# Patient Record
Sex: Female | Born: 1940 | Race: White | Hispanic: No | Smoking: Former smoker
Health system: Southern US, Community
[De-identification: ages and names within clinical notes are randomized; demographics above are authoritative.]

## PROBLEM LIST (undated history)

## (undated) DIAGNOSIS — I82409 Acute embolism and thrombosis of unspecified deep veins of unspecified lower extremity: Secondary | ICD-10-CM

## (undated) DIAGNOSIS — I10 Essential (primary) hypertension: Secondary | ICD-10-CM

## (undated) DIAGNOSIS — IMO0001 Reserved for inherently not codable concepts without codable children: Secondary | ICD-10-CM

## (undated) DIAGNOSIS — J449 Chronic obstructive pulmonary disease, unspecified: Secondary | ICD-10-CM

## (undated) DIAGNOSIS — D649 Anemia, unspecified: Secondary | ICD-10-CM

## (undated) DIAGNOSIS — K219 Gastro-esophageal reflux disease without esophagitis: Secondary | ICD-10-CM

## (undated) DIAGNOSIS — I2699 Other pulmonary embolism without acute cor pulmonale: Secondary | ICD-10-CM

## (undated) DIAGNOSIS — M1990 Unspecified osteoarthritis, unspecified site: Secondary | ICD-10-CM

## (undated) DIAGNOSIS — A419 Sepsis, unspecified organism: Secondary | ICD-10-CM

## (undated) DIAGNOSIS — F3130 Bipolar disorder, current episode depressed, mild or moderate severity, unspecified: Secondary | ICD-10-CM

## (undated) DIAGNOSIS — F419 Anxiety disorder, unspecified: Secondary | ICD-10-CM

## (undated) DIAGNOSIS — J40 Bronchitis, not specified as acute or chronic: Secondary | ICD-10-CM

## (undated) DIAGNOSIS — E785 Hyperlipidemia, unspecified: Secondary | ICD-10-CM

## (undated) DIAGNOSIS — Z22322 Carrier or suspected carrier of Methicillin resistant Staphylococcus aureus: Secondary | ICD-10-CM

## (undated) DIAGNOSIS — J189 Pneumonia, unspecified organism: Secondary | ICD-10-CM

## (undated) DIAGNOSIS — C801 Malignant (primary) neoplasm, unspecified: Secondary | ICD-10-CM

## (undated) DIAGNOSIS — Z5189 Encounter for other specified aftercare: Secondary | ICD-10-CM

## (undated) DIAGNOSIS — R6521 Severe sepsis with septic shock: Secondary | ICD-10-CM

## (undated) HISTORY — PX: CATARACT EXTRACTION, BILATERAL: SHX1313

## (undated) HISTORY — PX: CHOLECYSTECTOMY: SHX55

## (undated) HISTORY — PX: EYE SURGERY: SHX253

## (undated) HISTORY — DX: Acute embolism and thrombosis of unspecified deep veins of unspecified lower extremity: I82.409

## (undated) HISTORY — PX: OTHER SURGICAL HISTORY: SHX169

## (undated) HISTORY — DX: Essential (primary) hypertension: I10

## (undated) HISTORY — DX: Chronic obstructive pulmonary disease, unspecified: J44.9

## (undated) HISTORY — DX: Gastro-esophageal reflux disease without esophagitis: K21.9

## (undated) HISTORY — DX: Bipolar disorder, current episode depressed, mild or moderate severity, unspecified: F31.30

## (undated) HISTORY — DX: Hyperlipidemia, unspecified: E78.5

## (undated) HISTORY — DX: Unspecified osteoarthritis, unspecified site: M19.90

## (undated) HISTORY — DX: Anxiety disorder, unspecified: F41.9

## (undated) HISTORY — PX: TONSILLECTOMY: SUR1361

## (undated) HISTORY — DX: Other pulmonary embolism without acute cor pulmonale: I26.99

## (undated) HISTORY — PX: VENA CAVA FILTER PLACEMENT: SUR1032

---

## 1967-12-21 DIAGNOSIS — C801 Malignant (primary) neoplasm, unspecified: Secondary | ICD-10-CM

## 1967-12-21 HISTORY — PX: ABDOMINAL HYSTERECTOMY: SHX81

## 1967-12-21 HISTORY — DX: Malignant (primary) neoplasm, unspecified: C80.1

## 1998-05-23 ENCOUNTER — Encounter: Admission: RE | Admit: 1998-05-23 | Discharge: 1998-08-21 | Payer: Self-pay | Admitting: Anesthesiology

## 1998-06-01 ENCOUNTER — Emergency Department (HOSPITAL_COMMUNITY): Admission: EM | Admit: 1998-06-01 | Discharge: 1998-06-01 | Payer: Self-pay | Admitting: Emergency Medicine

## 1998-06-04 ENCOUNTER — Ambulatory Visit (HOSPITAL_COMMUNITY): Admission: RE | Admit: 1998-06-04 | Discharge: 1998-06-04 | Payer: Self-pay

## 1998-06-09 ENCOUNTER — Emergency Department (HOSPITAL_COMMUNITY): Admission: EM | Admit: 1998-06-09 | Discharge: 1998-06-09 | Payer: Self-pay | Admitting: Emergency Medicine

## 1998-06-10 ENCOUNTER — Emergency Department (HOSPITAL_COMMUNITY): Admission: EM | Admit: 1998-06-10 | Discharge: 1998-06-10 | Payer: Self-pay | Admitting: Emergency Medicine

## 1998-07-11 ENCOUNTER — Emergency Department (HOSPITAL_COMMUNITY): Admission: EM | Admit: 1998-07-11 | Discharge: 1998-07-11 | Payer: Self-pay | Admitting: Internal Medicine

## 1998-07-13 ENCOUNTER — Emergency Department (HOSPITAL_COMMUNITY): Admission: EM | Admit: 1998-07-13 | Discharge: 1998-07-13 | Payer: Self-pay | Admitting: Emergency Medicine

## 1998-07-14 ENCOUNTER — Emergency Department (HOSPITAL_COMMUNITY): Admission: EM | Admit: 1998-07-14 | Discharge: 1998-07-14 | Payer: Self-pay | Admitting: Emergency Medicine

## 1998-07-18 ENCOUNTER — Encounter: Admission: RE | Admit: 1998-07-18 | Discharge: 1998-07-18 | Payer: Self-pay | Admitting: Internal Medicine

## 1998-07-28 ENCOUNTER — Encounter: Admission: RE | Admit: 1998-07-28 | Discharge: 1998-07-28 | Payer: Self-pay | Admitting: Internal Medicine

## 1998-08-10 ENCOUNTER — Emergency Department (HOSPITAL_COMMUNITY): Admission: EM | Admit: 1998-08-10 | Discharge: 1998-08-10 | Payer: Self-pay | Admitting: Emergency Medicine

## 1998-08-19 ENCOUNTER — Ambulatory Visit (HOSPITAL_COMMUNITY): Admission: RE | Admit: 1998-08-19 | Discharge: 1998-08-19 | Payer: Self-pay | Admitting: Neurosurgery

## 1998-09-17 ENCOUNTER — Inpatient Hospital Stay (HOSPITAL_COMMUNITY): Admission: EM | Admit: 1998-09-17 | Discharge: 1998-09-22 | Payer: Self-pay | Admitting: Internal Medicine

## 1998-10-08 ENCOUNTER — Emergency Department (HOSPITAL_COMMUNITY): Admission: EM | Admit: 1998-10-08 | Discharge: 1998-10-08 | Payer: Self-pay | Admitting: Emergency Medicine

## 1999-06-25 ENCOUNTER — Emergency Department (HOSPITAL_COMMUNITY): Admission: EM | Admit: 1999-06-25 | Discharge: 1999-06-25 | Payer: Self-pay | Admitting: Emergency Medicine

## 1999-07-04 ENCOUNTER — Emergency Department (HOSPITAL_COMMUNITY): Admission: EM | Admit: 1999-07-04 | Discharge: 1999-07-04 | Payer: Self-pay | Admitting: Emergency Medicine

## 1999-08-12 ENCOUNTER — Encounter: Payer: Self-pay | Admitting: Pulmonary Disease

## 1999-08-12 ENCOUNTER — Ambulatory Visit (HOSPITAL_COMMUNITY): Admission: RE | Admit: 1999-08-12 | Discharge: 1999-08-12 | Payer: Self-pay | Admitting: Pulmonary Disease

## 1999-08-26 ENCOUNTER — Other Ambulatory Visit: Admission: RE | Admit: 1999-08-26 | Discharge: 1999-08-26 | Payer: Self-pay | Admitting: Internal Medicine

## 2000-03-04 ENCOUNTER — Emergency Department (HOSPITAL_COMMUNITY): Admission: EM | Admit: 2000-03-04 | Discharge: 2000-03-04 | Payer: Self-pay | Admitting: Emergency Medicine

## 2000-07-18 ENCOUNTER — Ambulatory Visit (HOSPITAL_COMMUNITY): Admission: RE | Admit: 2000-07-18 | Discharge: 2000-07-19 | Payer: Self-pay | Admitting: Neurosurgery

## 2000-08-15 ENCOUNTER — Emergency Department (HOSPITAL_COMMUNITY): Admission: EM | Admit: 2000-08-15 | Discharge: 2000-08-15 | Payer: Self-pay | Admitting: Emergency Medicine

## 2000-08-17 ENCOUNTER — Encounter: Admission: RE | Admit: 2000-08-17 | Discharge: 2000-08-17 | Payer: Self-pay | Admitting: Family Medicine

## 2000-09-13 ENCOUNTER — Encounter: Admission: RE | Admit: 2000-09-13 | Discharge: 2000-09-13 | Payer: Self-pay | Admitting: Family Medicine

## 2000-10-06 ENCOUNTER — Encounter: Payer: Self-pay | Admitting: Emergency Medicine

## 2000-10-06 ENCOUNTER — Emergency Department (HOSPITAL_COMMUNITY): Admission: EM | Admit: 2000-10-06 | Discharge: 2000-10-06 | Payer: Self-pay | Admitting: Emergency Medicine

## 2000-10-12 ENCOUNTER — Emergency Department (HOSPITAL_COMMUNITY): Admission: EM | Admit: 2000-10-12 | Discharge: 2000-10-12 | Payer: Self-pay | Admitting: Emergency Medicine

## 2000-11-05 ENCOUNTER — Inpatient Hospital Stay (HOSPITAL_COMMUNITY): Admission: EM | Admit: 2000-11-05 | Discharge: 2000-11-07 | Payer: Self-pay | Admitting: Sports Medicine

## 2000-11-05 ENCOUNTER — Encounter: Payer: Self-pay | Admitting: Emergency Medicine

## 2000-11-30 ENCOUNTER — Emergency Department (HOSPITAL_COMMUNITY): Admission: EM | Admit: 2000-11-30 | Discharge: 2000-11-30 | Payer: Self-pay | Admitting: Emergency Medicine

## 2000-12-18 ENCOUNTER — Inpatient Hospital Stay (HOSPITAL_COMMUNITY): Admission: EM | Admit: 2000-12-18 | Discharge: 2000-12-27 | Payer: Self-pay | Admitting: Psychiatry

## 2001-01-17 ENCOUNTER — Emergency Department (HOSPITAL_COMMUNITY): Admission: EM | Admit: 2001-01-17 | Discharge: 2001-01-17 | Payer: Self-pay | Admitting: Emergency Medicine

## 2001-01-29 ENCOUNTER — Emergency Department (HOSPITAL_COMMUNITY): Admission: EM | Admit: 2001-01-29 | Discharge: 2001-01-29 | Payer: Self-pay | Admitting: *Deleted

## 2001-01-31 ENCOUNTER — Emergency Department (HOSPITAL_COMMUNITY): Admission: EM | Admit: 2001-01-31 | Discharge: 2001-02-01 | Payer: Self-pay | Admitting: Emergency Medicine

## 2001-02-05 ENCOUNTER — Encounter: Payer: Self-pay | Admitting: Emergency Medicine

## 2001-02-05 ENCOUNTER — Emergency Department (HOSPITAL_COMMUNITY): Admission: EM | Admit: 2001-02-05 | Discharge: 2001-02-05 | Payer: Self-pay | Admitting: Emergency Medicine

## 2001-02-09 ENCOUNTER — Encounter: Admission: RE | Admit: 2001-02-09 | Discharge: 2001-02-09 | Payer: Self-pay | Admitting: Family Medicine

## 2001-02-16 ENCOUNTER — Encounter: Admission: RE | Admit: 2001-02-16 | Discharge: 2001-02-16 | Payer: Self-pay | Admitting: Family Medicine

## 2001-03-14 ENCOUNTER — Emergency Department (HOSPITAL_COMMUNITY): Admission: EM | Admit: 2001-03-14 | Discharge: 2001-03-14 | Payer: Self-pay | Admitting: *Deleted

## 2001-03-14 ENCOUNTER — Encounter: Payer: Self-pay | Admitting: Emergency Medicine

## 2001-03-30 ENCOUNTER — Emergency Department (HOSPITAL_COMMUNITY): Admission: EM | Admit: 2001-03-30 | Discharge: 2001-03-30 | Payer: Self-pay | Admitting: Emergency Medicine

## 2001-03-30 ENCOUNTER — Emergency Department (HOSPITAL_COMMUNITY): Admission: EM | Admit: 2001-03-30 | Discharge: 2001-03-31 | Payer: Self-pay | Admitting: Emergency Medicine

## 2001-04-01 ENCOUNTER — Emergency Department (HOSPITAL_COMMUNITY): Admission: EM | Admit: 2001-04-01 | Discharge: 2001-04-01 | Payer: Self-pay | Admitting: Emergency Medicine

## 2001-07-10 ENCOUNTER — Emergency Department (HOSPITAL_COMMUNITY): Admission: EM | Admit: 2001-07-10 | Discharge: 2001-07-10 | Payer: Self-pay | Admitting: Emergency Medicine

## 2001-07-10 ENCOUNTER — Encounter: Payer: Self-pay | Admitting: Emergency Medicine

## 2001-07-13 ENCOUNTER — Emergency Department (HOSPITAL_COMMUNITY): Admission: EM | Admit: 2001-07-13 | Discharge: 2001-07-13 | Payer: Self-pay | Admitting: Emergency Medicine

## 2001-07-14 ENCOUNTER — Encounter: Admission: RE | Admit: 2001-07-14 | Discharge: 2001-07-14 | Payer: Self-pay | Admitting: Family Medicine

## 2001-07-14 ENCOUNTER — Ambulatory Visit (HOSPITAL_COMMUNITY): Admission: RE | Admit: 2001-07-14 | Discharge: 2001-07-14 | Payer: Self-pay | Admitting: Family Medicine

## 2001-09-05 ENCOUNTER — Encounter: Admission: RE | Admit: 2001-09-05 | Discharge: 2001-09-05 | Payer: Self-pay | Admitting: Family Medicine

## 2001-09-17 ENCOUNTER — Encounter: Payer: Self-pay | Admitting: Emergency Medicine

## 2001-09-17 ENCOUNTER — Emergency Department (HOSPITAL_COMMUNITY): Admission: EM | Admit: 2001-09-17 | Discharge: 2001-09-17 | Payer: Self-pay | Admitting: Emergency Medicine

## 2001-09-28 ENCOUNTER — Emergency Department (HOSPITAL_COMMUNITY): Admission: EM | Admit: 2001-09-28 | Discharge: 2001-09-28 | Payer: Self-pay

## 2001-10-02 ENCOUNTER — Encounter: Admission: RE | Admit: 2001-10-02 | Discharge: 2001-10-02 | Payer: Self-pay | Admitting: Family Medicine

## 2001-10-06 ENCOUNTER — Emergency Department (HOSPITAL_COMMUNITY): Admission: EM | Admit: 2001-10-06 | Discharge: 2001-10-06 | Payer: Self-pay | Admitting: Emergency Medicine

## 2001-10-23 ENCOUNTER — Encounter: Admission: RE | Admit: 2001-10-23 | Discharge: 2001-10-23 | Payer: Self-pay | Admitting: Sports Medicine

## 2001-10-25 ENCOUNTER — Ambulatory Visit (HOSPITAL_COMMUNITY): Admission: RE | Admit: 2001-10-25 | Discharge: 2001-10-25 | Payer: Self-pay | Admitting: Family Medicine

## 2001-10-25 ENCOUNTER — Encounter: Admission: RE | Admit: 2001-10-25 | Discharge: 2001-10-25 | Payer: Self-pay | Admitting: Family Medicine

## 2001-10-26 ENCOUNTER — Ambulatory Visit: Admission: RE | Admit: 2001-10-26 | Discharge: 2001-10-26 | Payer: Self-pay | Admitting: Anesthesiology

## 2001-10-31 ENCOUNTER — Encounter: Admission: RE | Admit: 2001-10-31 | Discharge: 2001-10-31 | Payer: Self-pay | Admitting: Family Medicine

## 2001-10-31 ENCOUNTER — Emergency Department (HOSPITAL_COMMUNITY): Admission: EM | Admit: 2001-10-31 | Discharge: 2001-10-31 | Payer: Self-pay | Admitting: Emergency Medicine

## 2001-11-06 ENCOUNTER — Emergency Department (HOSPITAL_COMMUNITY): Admission: EM | Admit: 2001-11-06 | Discharge: 2001-11-06 | Payer: Self-pay | Admitting: Emergency Medicine

## 2001-11-17 ENCOUNTER — Emergency Department (HOSPITAL_COMMUNITY): Admission: EM | Admit: 2001-11-17 | Discharge: 2001-11-17 | Payer: Self-pay | Admitting: Emergency Medicine

## 2002-01-07 ENCOUNTER — Encounter: Payer: Self-pay | Admitting: Emergency Medicine

## 2002-01-07 ENCOUNTER — Inpatient Hospital Stay (HOSPITAL_COMMUNITY): Admission: EM | Admit: 2002-01-07 | Discharge: 2002-01-10 | Payer: Self-pay | Admitting: Emergency Medicine

## 2002-01-19 ENCOUNTER — Encounter: Admission: RE | Admit: 2002-01-19 | Discharge: 2002-01-19 | Payer: Self-pay | Admitting: Family Medicine

## 2002-01-25 ENCOUNTER — Encounter: Admission: RE | Admit: 2002-01-25 | Discharge: 2002-01-25 | Payer: Self-pay | Admitting: Family Medicine

## 2002-02-08 ENCOUNTER — Encounter: Admission: RE | Admit: 2002-02-08 | Discharge: 2002-02-08 | Payer: Self-pay | Admitting: Family Medicine

## 2002-04-19 ENCOUNTER — Emergency Department (HOSPITAL_COMMUNITY): Admission: EM | Admit: 2002-04-19 | Discharge: 2002-04-19 | Payer: Self-pay | Admitting: Emergency Medicine

## 2002-04-22 ENCOUNTER — Emergency Department (HOSPITAL_COMMUNITY): Admission: EM | Admit: 2002-04-22 | Discharge: 2002-04-22 | Payer: Self-pay | Admitting: Emergency Medicine

## 2002-04-28 ENCOUNTER — Emergency Department (HOSPITAL_COMMUNITY): Admission: EM | Admit: 2002-04-28 | Discharge: 2002-04-29 | Payer: Self-pay | Admitting: *Deleted

## 2002-05-05 ENCOUNTER — Inpatient Hospital Stay (HOSPITAL_COMMUNITY): Admission: EM | Admit: 2002-05-05 | Discharge: 2002-05-23 | Payer: Self-pay | Admitting: Psychiatry

## 2002-05-24 ENCOUNTER — Other Ambulatory Visit (HOSPITAL_COMMUNITY): Admission: RE | Admit: 2002-05-24 | Discharge: 2002-06-13 | Payer: Self-pay | Admitting: *Deleted

## 2002-06-27 ENCOUNTER — Encounter (INDEPENDENT_AMBULATORY_CARE_PROVIDER_SITE_OTHER): Payer: Self-pay | Admitting: *Deleted

## 2002-06-27 ENCOUNTER — Encounter: Admission: RE | Admit: 2002-06-27 | Discharge: 2002-06-27 | Payer: Self-pay | Admitting: Sports Medicine

## 2002-06-29 ENCOUNTER — Encounter: Payer: Self-pay | Admitting: Emergency Medicine

## 2002-06-29 ENCOUNTER — Inpatient Hospital Stay (HOSPITAL_COMMUNITY): Admission: EM | Admit: 2002-06-29 | Discharge: 2002-07-12 | Payer: Self-pay | Admitting: Psychiatry

## 2002-07-29 ENCOUNTER — Emergency Department (HOSPITAL_COMMUNITY): Admission: EM | Admit: 2002-07-29 | Discharge: 2002-07-29 | Payer: Self-pay | Admitting: Emergency Medicine

## 2002-07-29 ENCOUNTER — Encounter: Payer: Self-pay | Admitting: Emergency Medicine

## 2002-09-13 ENCOUNTER — Encounter: Admission: RE | Admit: 2002-09-13 | Discharge: 2002-09-13 | Payer: Self-pay | Admitting: Family Medicine

## 2002-09-14 ENCOUNTER — Encounter: Payer: Self-pay | Admitting: Emergency Medicine

## 2002-09-14 ENCOUNTER — Emergency Department (HOSPITAL_COMMUNITY): Admission: EM | Admit: 2002-09-14 | Discharge: 2002-09-14 | Payer: Self-pay | Admitting: Emergency Medicine

## 2002-09-21 ENCOUNTER — Ambulatory Visit (HOSPITAL_COMMUNITY): Admission: RE | Admit: 2002-09-21 | Discharge: 2002-09-21 | Payer: Self-pay | Admitting: *Deleted

## 2002-09-21 ENCOUNTER — Encounter: Admission: RE | Admit: 2002-09-21 | Discharge: 2002-09-21 | Payer: Self-pay | Admitting: Family Medicine

## 2002-09-23 ENCOUNTER — Encounter: Payer: Self-pay | Admitting: Emergency Medicine

## 2002-09-23 ENCOUNTER — Inpatient Hospital Stay (HOSPITAL_COMMUNITY): Admission: EM | Admit: 2002-09-23 | Discharge: 2002-09-24 | Payer: Self-pay | Admitting: Emergency Medicine

## 2002-09-28 ENCOUNTER — Encounter: Admission: RE | Admit: 2002-09-28 | Discharge: 2002-09-28 | Payer: Self-pay | Admitting: Family Medicine

## 2002-10-02 ENCOUNTER — Encounter: Admission: RE | Admit: 2002-10-02 | Discharge: 2002-10-02 | Payer: Self-pay | Admitting: Family Medicine

## 2002-10-03 ENCOUNTER — Encounter: Admission: RE | Admit: 2002-10-03 | Discharge: 2002-10-03 | Payer: Self-pay | Admitting: Family Medicine

## 2002-10-06 ENCOUNTER — Emergency Department (HOSPITAL_COMMUNITY): Admission: EM | Admit: 2002-10-06 | Discharge: 2002-10-06 | Payer: Self-pay | Admitting: *Deleted

## 2002-10-20 ENCOUNTER — Emergency Department (HOSPITAL_COMMUNITY): Admission: EM | Admit: 2002-10-20 | Discharge: 2002-10-20 | Payer: Self-pay | Admitting: *Deleted

## 2002-10-24 ENCOUNTER — Encounter: Admission: RE | Admit: 2002-10-24 | Discharge: 2002-10-24 | Payer: Self-pay | Admitting: Family Medicine

## 2002-11-19 ENCOUNTER — Encounter: Payer: Self-pay | Admitting: *Deleted

## 2002-11-19 ENCOUNTER — Inpatient Hospital Stay (HOSPITAL_COMMUNITY): Admission: EM | Admit: 2002-11-19 | Discharge: 2002-11-21 | Payer: Self-pay | Admitting: Family Medicine

## 2002-11-19 ENCOUNTER — Emergency Department (HOSPITAL_COMMUNITY): Admission: EM | Admit: 2002-11-19 | Discharge: 2002-11-19 | Payer: Self-pay | Admitting: Emergency Medicine

## 2002-11-23 ENCOUNTER — Encounter: Admission: RE | Admit: 2002-11-23 | Discharge: 2002-11-23 | Payer: Self-pay | Admitting: Family Medicine

## 2002-12-04 ENCOUNTER — Encounter: Admission: RE | Admit: 2002-12-04 | Discharge: 2002-12-04 | Payer: Self-pay | Admitting: Family Medicine

## 2002-12-12 ENCOUNTER — Emergency Department (HOSPITAL_COMMUNITY): Admission: EM | Admit: 2002-12-12 | Discharge: 2002-12-12 | Payer: Self-pay | Admitting: Emergency Medicine

## 2002-12-14 ENCOUNTER — Encounter: Admission: RE | Admit: 2002-12-14 | Discharge: 2002-12-14 | Payer: Self-pay | Admitting: Family Medicine

## 2002-12-16 ENCOUNTER — Encounter: Payer: Self-pay | Admitting: Emergency Medicine

## 2002-12-16 ENCOUNTER — Emergency Department (HOSPITAL_COMMUNITY): Admission: EM | Admit: 2002-12-16 | Discharge: 2002-12-16 | Payer: Self-pay | Admitting: Emergency Medicine

## 2002-12-17 ENCOUNTER — Encounter: Admission: RE | Admit: 2002-12-17 | Discharge: 2002-12-17 | Payer: Self-pay | Admitting: Sports Medicine

## 2002-12-26 ENCOUNTER — Encounter: Admission: RE | Admit: 2002-12-26 | Discharge: 2002-12-26 | Payer: Self-pay | Admitting: Family Medicine

## 2002-12-28 ENCOUNTER — Encounter: Payer: Self-pay | Admitting: Emergency Medicine

## 2002-12-28 ENCOUNTER — Emergency Department (HOSPITAL_COMMUNITY): Admission: EM | Admit: 2002-12-28 | Discharge: 2002-12-28 | Payer: Self-pay | Admitting: Emergency Medicine

## 2002-12-31 ENCOUNTER — Emergency Department (HOSPITAL_COMMUNITY): Admission: EM | Admit: 2002-12-31 | Discharge: 2002-12-31 | Payer: Self-pay | Admitting: Emergency Medicine

## 2002-12-31 ENCOUNTER — Encounter: Payer: Self-pay | Admitting: Emergency Medicine

## 2003-01-02 ENCOUNTER — Encounter: Admission: RE | Admit: 2003-01-02 | Discharge: 2003-01-02 | Payer: Self-pay | Admitting: Family Medicine

## 2003-01-09 ENCOUNTER — Encounter: Admission: RE | Admit: 2003-01-09 | Discharge: 2003-01-09 | Payer: Self-pay | Admitting: Family Medicine

## 2003-01-11 ENCOUNTER — Encounter: Payer: Self-pay | Admitting: Emergency Medicine

## 2003-01-11 ENCOUNTER — Emergency Department (HOSPITAL_COMMUNITY): Admission: EM | Admit: 2003-01-11 | Discharge: 2003-01-11 | Payer: Self-pay | Admitting: Emergency Medicine

## 2003-01-15 ENCOUNTER — Emergency Department (HOSPITAL_COMMUNITY): Admission: EM | Admit: 2003-01-15 | Discharge: 2003-01-15 | Payer: Self-pay | Admitting: Emergency Medicine

## 2003-01-15 ENCOUNTER — Encounter: Payer: Self-pay | Admitting: Emergency Medicine

## 2003-01-21 ENCOUNTER — Encounter: Payer: Self-pay | Admitting: Emergency Medicine

## 2003-01-21 ENCOUNTER — Emergency Department (HOSPITAL_COMMUNITY): Admission: EM | Admit: 2003-01-21 | Discharge: 2003-01-21 | Payer: Self-pay | Admitting: Emergency Medicine

## 2003-01-28 ENCOUNTER — Encounter: Payer: Self-pay | Admitting: Emergency Medicine

## 2003-01-28 ENCOUNTER — Emergency Department (HOSPITAL_COMMUNITY): Admission: EM | Admit: 2003-01-28 | Discharge: 2003-01-28 | Payer: Self-pay | Admitting: Emergency Medicine

## 2003-01-30 ENCOUNTER — Emergency Department (HOSPITAL_COMMUNITY): Admission: EM | Admit: 2003-01-30 | Discharge: 2003-01-30 | Payer: Self-pay | Admitting: Emergency Medicine

## 2003-02-07 ENCOUNTER — Encounter: Payer: Self-pay | Admitting: Emergency Medicine

## 2003-02-07 ENCOUNTER — Emergency Department (HOSPITAL_COMMUNITY): Admission: EM | Admit: 2003-02-07 | Discharge: 2003-02-07 | Payer: Self-pay | Admitting: Emergency Medicine

## 2003-02-09 ENCOUNTER — Encounter: Payer: Self-pay | Admitting: Emergency Medicine

## 2003-02-09 ENCOUNTER — Emergency Department (HOSPITAL_COMMUNITY): Admission: EM | Admit: 2003-02-09 | Discharge: 2003-02-09 | Payer: Self-pay | Admitting: Emergency Medicine

## 2003-02-15 ENCOUNTER — Emergency Department (HOSPITAL_COMMUNITY): Admission: EM | Admit: 2003-02-15 | Discharge: 2003-02-15 | Payer: Self-pay | Admitting: Emergency Medicine

## 2003-02-21 ENCOUNTER — Encounter: Payer: Self-pay | Admitting: Family Medicine

## 2003-02-21 ENCOUNTER — Encounter: Admission: RE | Admit: 2003-02-21 | Discharge: 2003-02-21 | Payer: Self-pay | Admitting: Family Medicine

## 2003-02-22 ENCOUNTER — Emergency Department (HOSPITAL_COMMUNITY): Admission: EM | Admit: 2003-02-22 | Discharge: 2003-02-22 | Payer: Self-pay | Admitting: Emergency Medicine

## 2003-02-23 IMAGING — CT CT HEAD W/O CM
1 of 2 series · 13 of 30 positions shown, 17 images · non-contrast
Comparison: 06/29/02.

FINDINGS
CLINICAL DATA: 61 YEAR OLD WHO FELL.  PATIENT COMPLAINS OF DIZZINESS.
CT HEAD WITHOUT CONTRAST
AXIAL IMAGES ARE ACQUIRED THROUGH THE BRAIN AT 5 MM INCREMENTS WITHOUT CONTRAST.

[Series 2: — · axial · 0.43mm/px · z∈[+1246,+1371]mm · 13 of 32 slices shown, 17 images]
[im 3/32  brain]
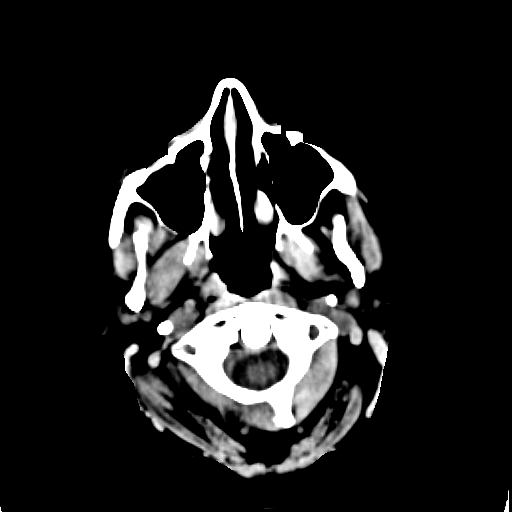
[im 3/32  bone]
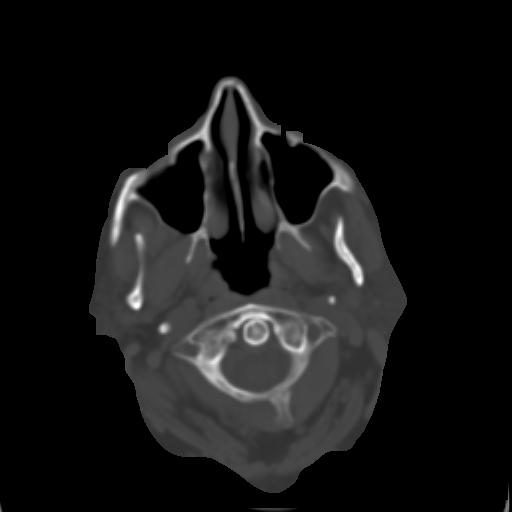
[im 5/32  brain]
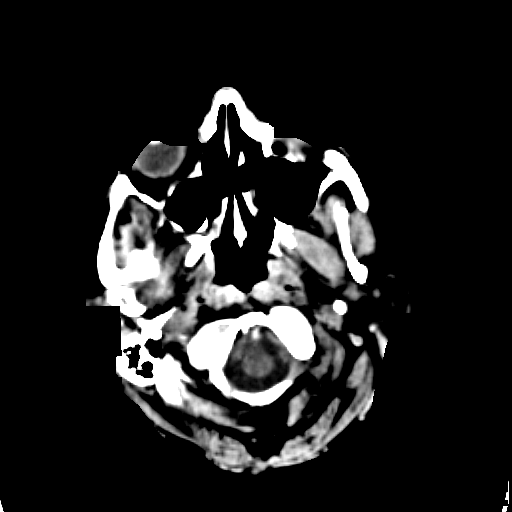
[im 7/32  brain]
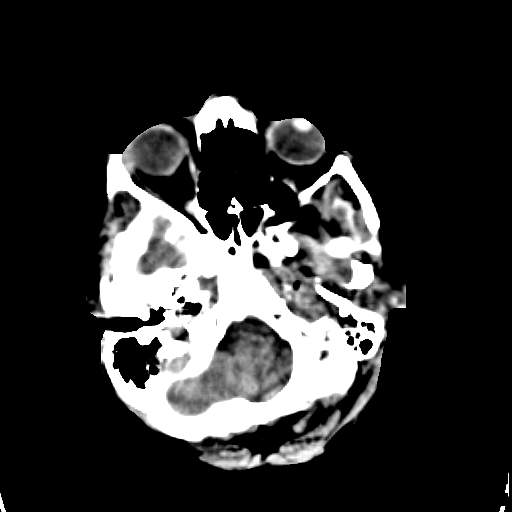
[im 9/32  brain]
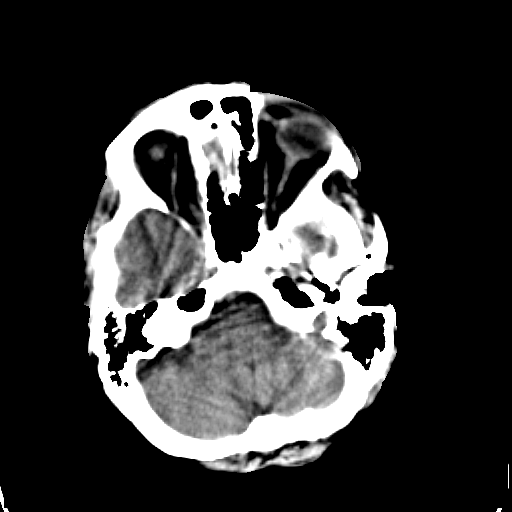
[im 12/32  brain]
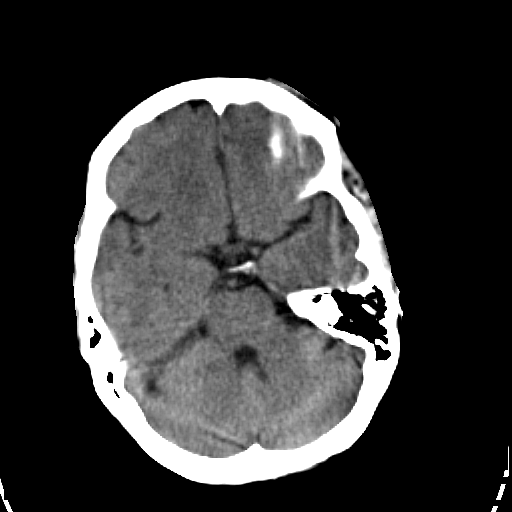
[im 12/32  bone]
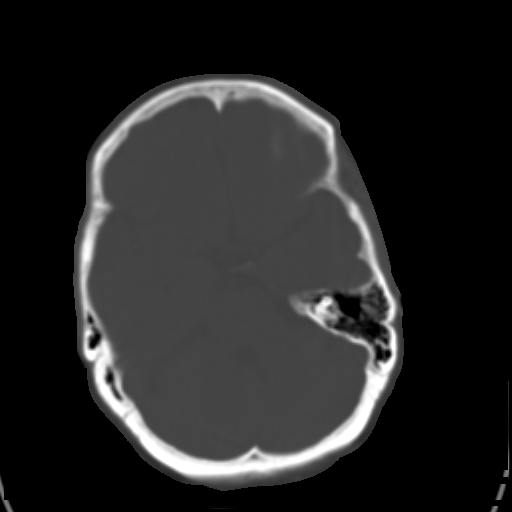
[im 14/32  brain]
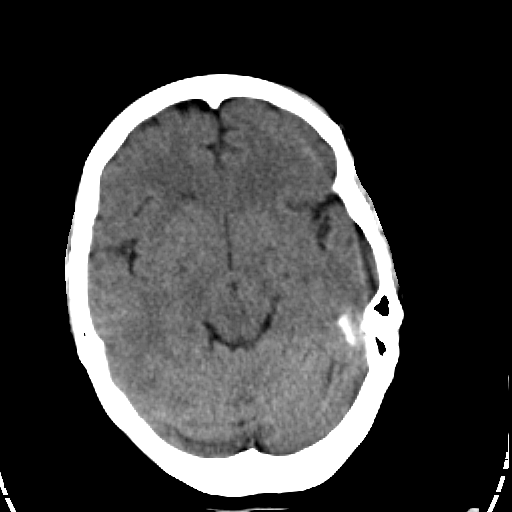
[im 16/32  brain]
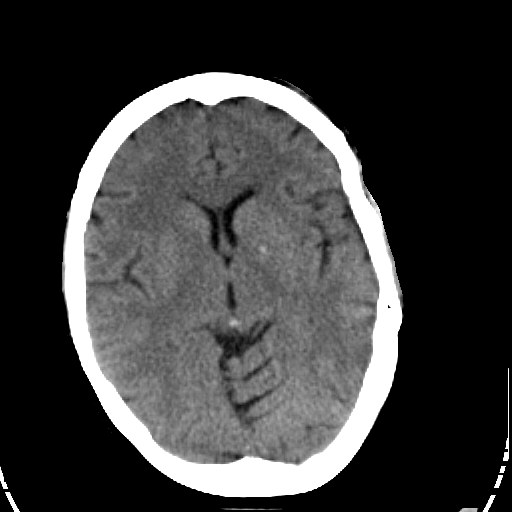
[im 18/32  brain]
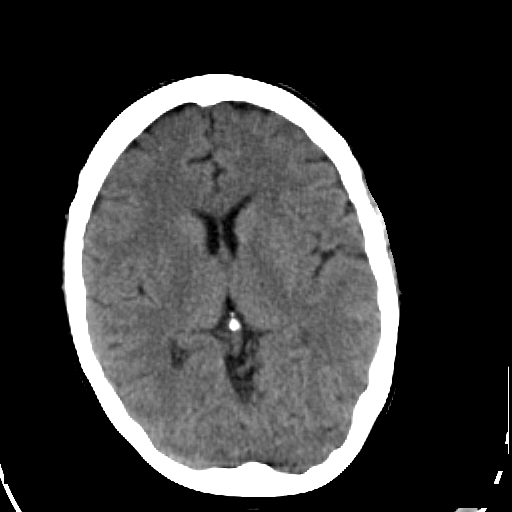
[im 20/32  brain]
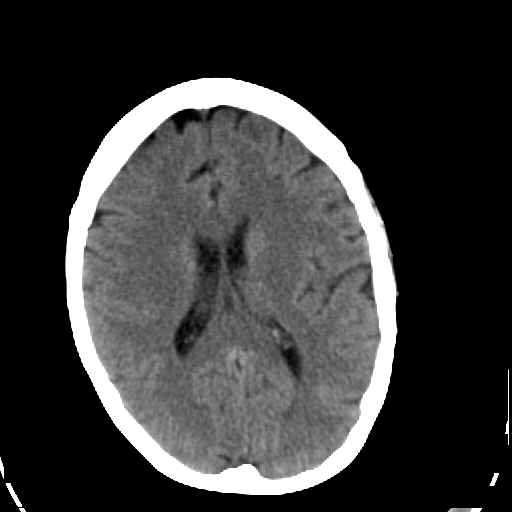
[im 20/32  bone]
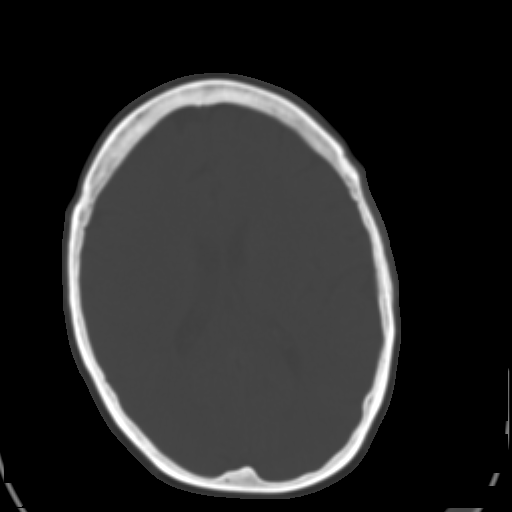
[im 23/32  brain]
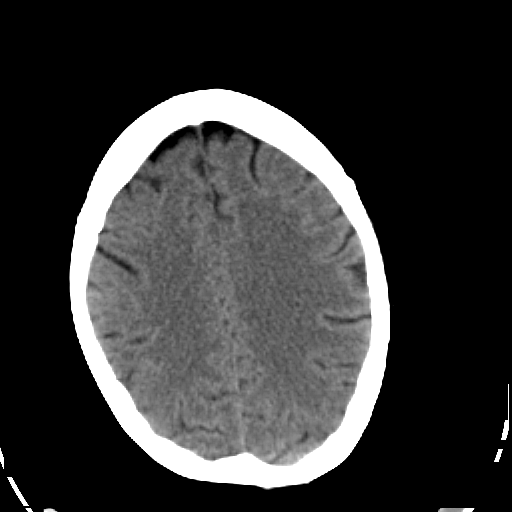
[im 25/32  brain]
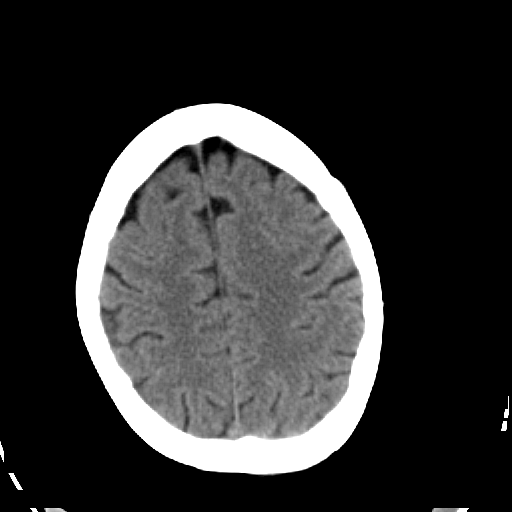
[im 27/32  brain]
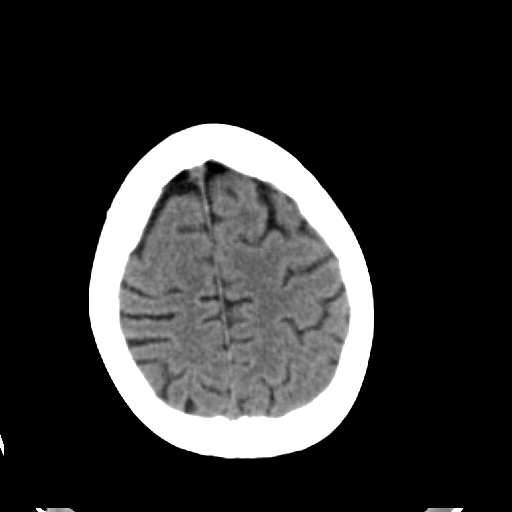
[im 29/32  brain]
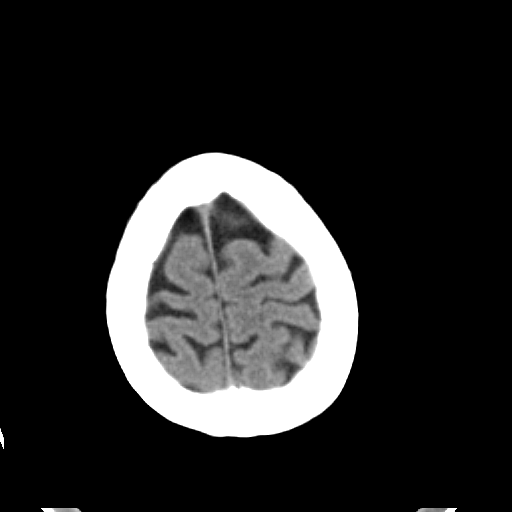
[im 29/32  bone]
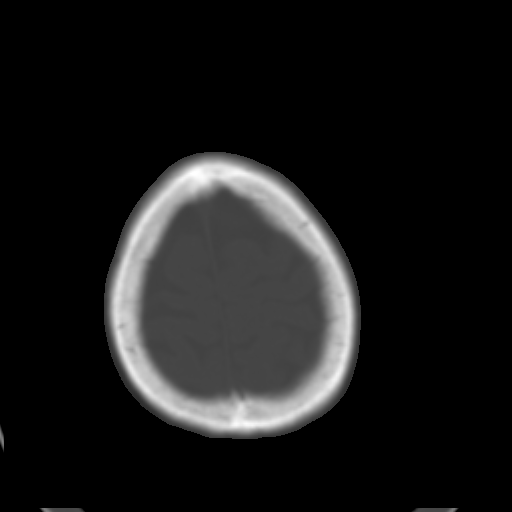

[13 of 30 positions shown; findings below may reference images not displayed]

THERE IS NO INTRA- OR EXTRA-AXIAL FLUID COLLECTION OR MASS.  THE BASILAR CISTERNS AND VENTRICLES
HAVE A NORMAL APPEARANCE.  THERE IS SOFT TISSUE EDEMA OVERLYING THE LEFT ORBIT AND LEFT FRONTAL
SCALP.  THERE IS NO EVIDENCE FOR CALVARIAL FRACTURE HOWEVER.
IMPRESSION
1.   NO EVIDENCE FOR ACUTE INTRACRANIAL ABNORMALITY.
2.   LEFT ORBITAL AND LEFT FRONTAL SOFT TISSUE EDEMA WITHOUT EVIDENCE FOR UNDERLYING CALVARIAL
INJURY.

## 2003-03-03 ENCOUNTER — Emergency Department (HOSPITAL_COMMUNITY): Admission: EM | Admit: 2003-03-03 | Discharge: 2003-03-04 | Payer: Self-pay

## 2003-03-19 ENCOUNTER — Encounter: Admission: RE | Admit: 2003-03-19 | Discharge: 2003-03-19 | Payer: Self-pay | Admitting: Family Medicine

## 2003-03-22 ENCOUNTER — Encounter: Admission: RE | Admit: 2003-03-22 | Discharge: 2003-03-22 | Payer: Self-pay | Admitting: Family Medicine

## 2003-03-25 ENCOUNTER — Encounter: Admission: RE | Admit: 2003-03-25 | Discharge: 2003-03-25 | Payer: Self-pay | Admitting: Family Medicine

## 2003-03-28 ENCOUNTER — Encounter: Admission: RE | Admit: 2003-03-28 | Discharge: 2003-03-28 | Payer: Self-pay | Admitting: Family Medicine

## 2003-04-01 ENCOUNTER — Encounter: Admission: RE | Admit: 2003-04-01 | Discharge: 2003-04-01 | Payer: Self-pay | Admitting: Family Medicine

## 2003-04-04 ENCOUNTER — Encounter: Admission: RE | Admit: 2003-04-04 | Discharge: 2003-04-04 | Payer: Self-pay | Admitting: Family Medicine

## 2003-04-08 ENCOUNTER — Encounter: Admission: RE | Admit: 2003-04-08 | Discharge: 2003-04-08 | Payer: Self-pay | Admitting: Family Medicine

## 2003-04-09 ENCOUNTER — Encounter: Admission: RE | Admit: 2003-04-09 | Discharge: 2003-04-09 | Payer: Self-pay | Admitting: Family Medicine

## 2003-04-11 ENCOUNTER — Encounter: Admission: RE | Admit: 2003-04-11 | Discharge: 2003-04-11 | Payer: Self-pay | Admitting: Family Medicine

## 2003-04-17 ENCOUNTER — Encounter: Admission: RE | Admit: 2003-04-17 | Discharge: 2003-04-17 | Payer: Self-pay | Admitting: Family Medicine

## 2003-04-18 ENCOUNTER — Encounter: Admission: RE | Admit: 2003-04-18 | Discharge: 2003-04-18 | Payer: Self-pay | Admitting: Family Medicine

## 2003-04-21 ENCOUNTER — Emergency Department (HOSPITAL_COMMUNITY): Admission: EM | Admit: 2003-04-21 | Discharge: 2003-04-21 | Payer: Self-pay | Admitting: *Deleted

## 2003-04-21 ENCOUNTER — Encounter: Payer: Self-pay | Admitting: *Deleted

## 2003-04-23 ENCOUNTER — Encounter: Admission: RE | Admit: 2003-04-23 | Discharge: 2003-04-23 | Payer: Self-pay | Admitting: Family Medicine

## 2003-04-25 ENCOUNTER — Encounter: Admission: RE | Admit: 2003-04-25 | Discharge: 2003-04-25 | Payer: Self-pay | Admitting: Family Medicine

## 2003-05-03 ENCOUNTER — Encounter: Admission: RE | Admit: 2003-05-03 | Discharge: 2003-05-03 | Payer: Self-pay | Admitting: Family Medicine

## 2003-05-07 ENCOUNTER — Emergency Department (HOSPITAL_COMMUNITY): Admission: EM | Admit: 2003-05-07 | Discharge: 2003-05-07 | Payer: Self-pay | Admitting: Emergency Medicine

## 2003-05-09 ENCOUNTER — Encounter: Payer: Self-pay | Admitting: Emergency Medicine

## 2003-05-09 ENCOUNTER — Inpatient Hospital Stay (HOSPITAL_COMMUNITY): Admission: EM | Admit: 2003-05-09 | Discharge: 2003-05-11 | Payer: Self-pay

## 2003-05-14 ENCOUNTER — Encounter: Admission: RE | Admit: 2003-05-14 | Discharge: 2003-05-14 | Payer: Self-pay | Admitting: Family Medicine

## 2003-05-15 ENCOUNTER — Emergency Department (HOSPITAL_COMMUNITY): Admission: EM | Admit: 2003-05-15 | Discharge: 2003-05-15 | Payer: Self-pay | Admitting: Emergency Medicine

## 2003-05-15 ENCOUNTER — Encounter: Admission: RE | Admit: 2003-05-15 | Discharge: 2003-05-15 | Payer: Self-pay | Admitting: Family Medicine

## 2003-05-17 ENCOUNTER — Encounter: Admission: RE | Admit: 2003-05-17 | Discharge: 2003-05-17 | Payer: Self-pay | Admitting: Family Medicine

## 2003-05-22 ENCOUNTER — Emergency Department (HOSPITAL_COMMUNITY): Admission: EM | Admit: 2003-05-22 | Discharge: 2003-05-22 | Payer: Self-pay | Admitting: Emergency Medicine

## 2003-05-22 ENCOUNTER — Encounter: Payer: Self-pay | Admitting: Emergency Medicine

## 2003-05-29 ENCOUNTER — Encounter: Admission: RE | Admit: 2003-05-29 | Discharge: 2003-05-29 | Payer: Self-pay | Admitting: Family Medicine

## 2003-06-13 ENCOUNTER — Encounter: Admission: RE | Admit: 2003-06-13 | Discharge: 2003-06-13 | Payer: Self-pay | Admitting: Family Medicine

## 2003-06-20 ENCOUNTER — Encounter: Admission: RE | Admit: 2003-06-20 | Discharge: 2003-06-20 | Payer: Self-pay | Admitting: Sports Medicine

## 2003-06-21 ENCOUNTER — Encounter: Admission: RE | Admit: 2003-06-21 | Discharge: 2003-06-21 | Payer: Self-pay | Admitting: Family Medicine

## 2003-06-28 ENCOUNTER — Encounter: Admission: RE | Admit: 2003-06-28 | Discharge: 2003-06-28 | Payer: Self-pay

## 2003-07-05 ENCOUNTER — Encounter: Admission: RE | Admit: 2003-07-05 | Discharge: 2003-07-05 | Payer: Self-pay | Admitting: Sports Medicine

## 2003-07-12 ENCOUNTER — Encounter: Admission: RE | Admit: 2003-07-12 | Discharge: 2003-07-12 | Payer: Self-pay | Admitting: Family Medicine

## 2003-07-15 ENCOUNTER — Encounter: Admission: RE | Admit: 2003-07-15 | Discharge: 2003-07-15 | Payer: Self-pay | Admitting: Family Medicine

## 2003-07-21 ENCOUNTER — Encounter: Payer: Self-pay | Admitting: Emergency Medicine

## 2003-07-21 ENCOUNTER — Emergency Department (HOSPITAL_COMMUNITY): Admission: EM | Admit: 2003-07-21 | Discharge: 2003-07-21 | Payer: Self-pay | Admitting: Emergency Medicine

## 2003-08-01 ENCOUNTER — Encounter: Admission: RE | Admit: 2003-08-01 | Discharge: 2003-08-01 | Payer: Self-pay | Admitting: Family Medicine

## 2003-08-08 ENCOUNTER — Encounter: Admission: RE | Admit: 2003-08-08 | Discharge: 2003-08-08 | Payer: Self-pay | Admitting: Family Medicine

## 2003-08-12 ENCOUNTER — Inpatient Hospital Stay (HOSPITAL_COMMUNITY): Admission: EM | Admit: 2003-08-12 | Discharge: 2003-08-14 | Payer: Self-pay | Admitting: *Deleted

## 2003-08-12 ENCOUNTER — Encounter: Payer: Self-pay | Admitting: *Deleted

## 2003-08-13 ENCOUNTER — Encounter: Payer: Self-pay | Admitting: Family Medicine

## 2003-08-21 ENCOUNTER — Encounter: Admission: RE | Admit: 2003-08-21 | Discharge: 2003-08-21 | Payer: Self-pay | Admitting: Family Medicine

## 2003-08-30 ENCOUNTER — Encounter: Admission: RE | Admit: 2003-08-30 | Discharge: 2003-08-30 | Payer: Self-pay | Admitting: Family Medicine

## 2003-09-01 ENCOUNTER — Emergency Department (HOSPITAL_COMMUNITY): Admission: EM | Admit: 2003-09-01 | Discharge: 2003-09-01 | Payer: Self-pay | Admitting: Emergency Medicine

## 2003-09-01 ENCOUNTER — Encounter: Payer: Self-pay | Admitting: Emergency Medicine

## 2003-09-03 ENCOUNTER — Encounter: Payer: Self-pay | Admitting: Emergency Medicine

## 2003-09-03 ENCOUNTER — Emergency Department (HOSPITAL_COMMUNITY): Admission: EM | Admit: 2003-09-03 | Discharge: 2003-09-03 | Payer: Self-pay

## 2003-09-10 ENCOUNTER — Encounter: Admission: RE | Admit: 2003-09-10 | Discharge: 2003-09-10 | Payer: Self-pay | Admitting: Family Medicine

## 2003-09-10 ENCOUNTER — Encounter: Payer: Self-pay | Admitting: Family Medicine

## 2003-09-16 ENCOUNTER — Inpatient Hospital Stay (HOSPITAL_COMMUNITY): Admission: EM | Admit: 2003-09-16 | Discharge: 2003-09-24 | Payer: Self-pay | Admitting: Psychiatry

## 2003-09-18 ENCOUNTER — Ambulatory Visit (HOSPITAL_COMMUNITY): Admission: RE | Admit: 2003-09-18 | Discharge: 2003-09-18 | Payer: Self-pay | Admitting: Internal Medicine

## 2003-09-30 ENCOUNTER — Encounter: Admission: RE | Admit: 2003-09-30 | Discharge: 2003-09-30 | Payer: Self-pay | Admitting: Family Medicine

## 2003-10-25 ENCOUNTER — Encounter: Admission: RE | Admit: 2003-10-25 | Discharge: 2003-10-25 | Payer: Self-pay | Admitting: Family Medicine

## 2003-10-30 ENCOUNTER — Encounter: Admission: RE | Admit: 2003-10-30 | Discharge: 2003-10-30 | Payer: Self-pay | Admitting: Family Medicine

## 2003-11-21 ENCOUNTER — Encounter: Admission: RE | Admit: 2003-11-21 | Discharge: 2003-11-21 | Payer: Self-pay | Admitting: Family Medicine

## 2003-11-23 ENCOUNTER — Encounter (INDEPENDENT_AMBULATORY_CARE_PROVIDER_SITE_OTHER): Payer: Self-pay | Admitting: *Deleted

## 2003-12-02 ENCOUNTER — Emergency Department (HOSPITAL_COMMUNITY): Admission: EM | Admit: 2003-12-02 | Discharge: 2003-12-02 | Payer: Self-pay | Admitting: Emergency Medicine

## 2003-12-02 ENCOUNTER — Inpatient Hospital Stay (HOSPITAL_COMMUNITY): Admission: AD | Admit: 2003-12-02 | Discharge: 2003-12-03 | Payer: Self-pay | Admitting: Family Medicine

## 2003-12-05 ENCOUNTER — Emergency Department (HOSPITAL_COMMUNITY): Admission: EM | Admit: 2003-12-05 | Discharge: 2003-12-05 | Payer: Self-pay | Admitting: Emergency Medicine

## 2003-12-06 ENCOUNTER — Encounter (INDEPENDENT_AMBULATORY_CARE_PROVIDER_SITE_OTHER): Payer: Self-pay | Admitting: Specialist

## 2003-12-06 ENCOUNTER — Encounter: Admission: RE | Admit: 2003-12-06 | Discharge: 2003-12-06 | Payer: Self-pay | Admitting: Sports Medicine

## 2003-12-06 ENCOUNTER — Other Ambulatory Visit: Admission: RE | Admit: 2003-12-06 | Discharge: 2003-12-06 | Payer: Self-pay | Admitting: Family Medicine

## 2003-12-07 ENCOUNTER — Emergency Department (HOSPITAL_COMMUNITY): Admission: EM | Admit: 2003-12-07 | Discharge: 2003-12-07 | Payer: Self-pay | Admitting: Emergency Medicine

## 2003-12-08 ENCOUNTER — Emergency Department (HOSPITAL_COMMUNITY): Admission: EM | Admit: 2003-12-08 | Discharge: 2003-12-08 | Payer: Self-pay

## 2003-12-08 ENCOUNTER — Emergency Department (HOSPITAL_COMMUNITY): Admission: EM | Admit: 2003-12-08 | Discharge: 2003-12-08 | Payer: Self-pay | Admitting: Emergency Medicine

## 2003-12-14 ENCOUNTER — Emergency Department (HOSPITAL_COMMUNITY): Admission: EM | Admit: 2003-12-14 | Discharge: 2003-12-14 | Payer: Self-pay | Admitting: Emergency Medicine

## 2003-12-17 ENCOUNTER — Emergency Department (HOSPITAL_COMMUNITY): Admission: EM | Admit: 2003-12-17 | Discharge: 2003-12-17 | Payer: Self-pay | Admitting: Emergency Medicine

## 2003-12-17 ENCOUNTER — Encounter: Admission: RE | Admit: 2003-12-17 | Discharge: 2003-12-17 | Payer: Self-pay | Admitting: Family Medicine

## 2003-12-20 ENCOUNTER — Inpatient Hospital Stay (HOSPITAL_COMMUNITY): Admission: EM | Admit: 2003-12-20 | Discharge: 2003-12-27 | Payer: Self-pay | Admitting: Family Medicine

## 2003-12-20 ENCOUNTER — Emergency Department (HOSPITAL_COMMUNITY): Admission: EM | Admit: 2003-12-20 | Discharge: 2003-12-20 | Payer: Self-pay | Admitting: Emergency Medicine

## 2004-01-08 ENCOUNTER — Encounter: Admission: RE | Admit: 2004-01-08 | Discharge: 2004-01-08 | Payer: Self-pay | Admitting: Family Medicine

## 2004-01-10 IMAGING — CT CT CHEST W/ CM
1 of 3 series · 16 of 28 positions shown, 20 images · IV contrast (100 ML OMNI 300)
Comparison: none

CLINICAL DATA: Chest pain.
 CT CHEST WITH CONTRAST
 150 cc Omnipaque 300 IV.  PE protocol.  No comparison. 
 Negative for pulmonary embolism.  There is no infiltrate or effusion.  There is some mild mediastinal adenopathy with a 10 x 20 mm subcarinal node and some subcentimeter right paratracheal nodes.  These are nonspecific.  
 IMPRESSION
 Negative for pulmonary embolism.  The lungs are clear. 
 Nonspecific mediastinal lymph adenopathy is noted. 
 CT OF THE LOWER EXTREMITIES WITH CONTRAST
 The popliteal through the iliac veins were scanned.  Negative for deep venous thrombosis in the lower extremities. 
 Negative for lower extremity DVT.

[Series 2: pe · axial · 0.63mm/px · z∈[-232,-8]mm · 16 of 413 slices shown, 20 images]
[im 20/413  mediastinal]
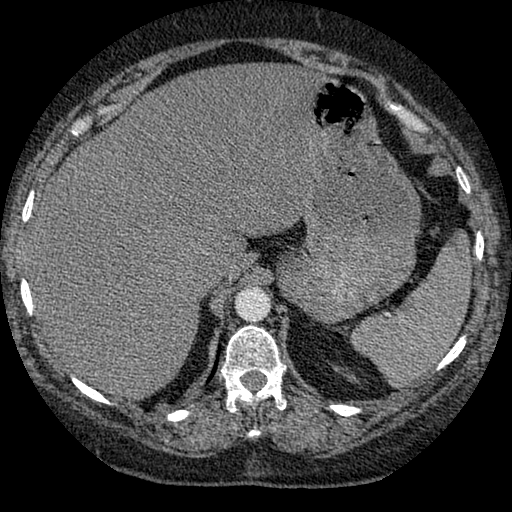
[im 20/413  lung]
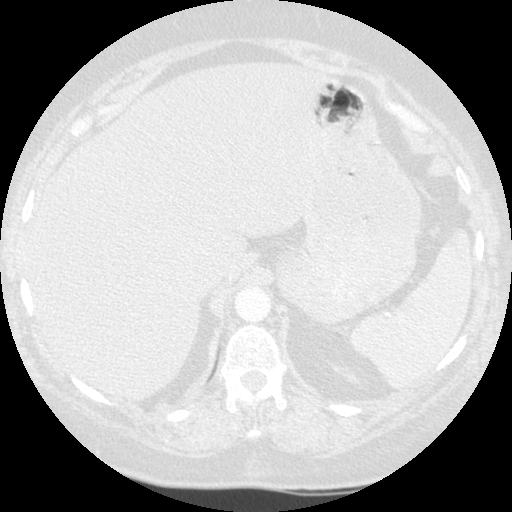
[im 40/413  lung]
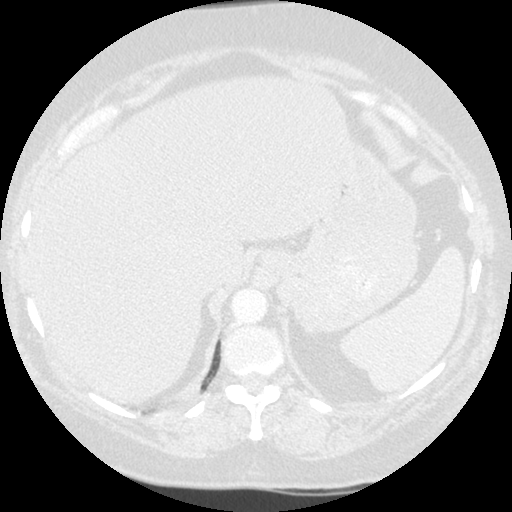
[im 79/413  lung]
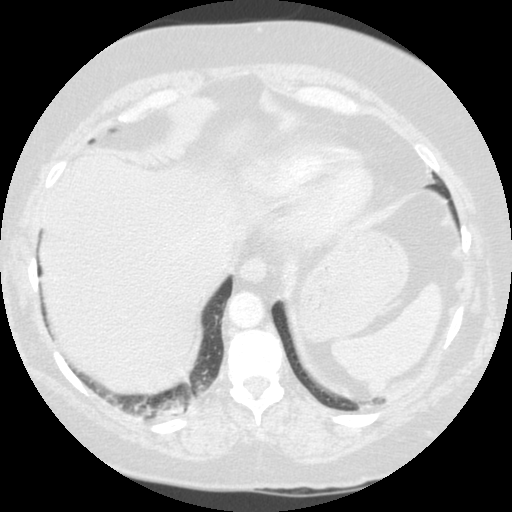
[im 99/413  lung]
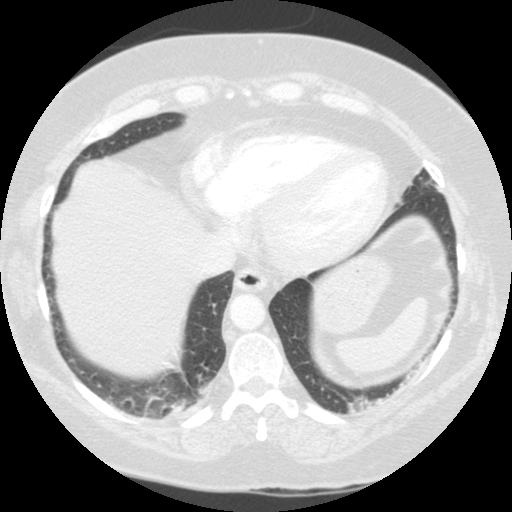
[im 118/413  mediastinal]
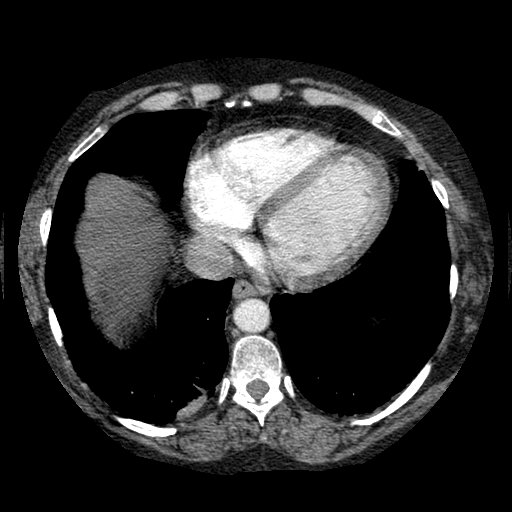
[im 118/413  lung]
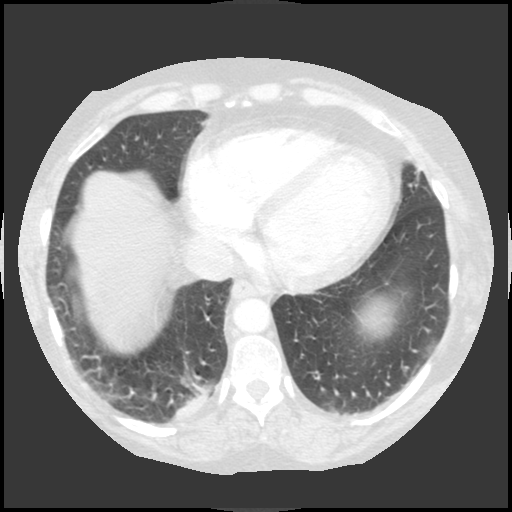
[im 138/413  lung]
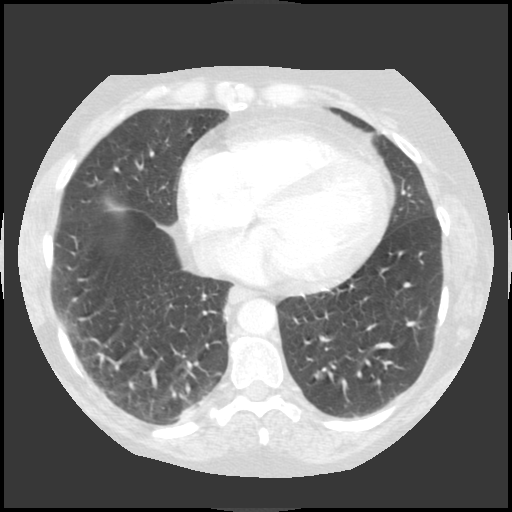
[im 177/413  lung]
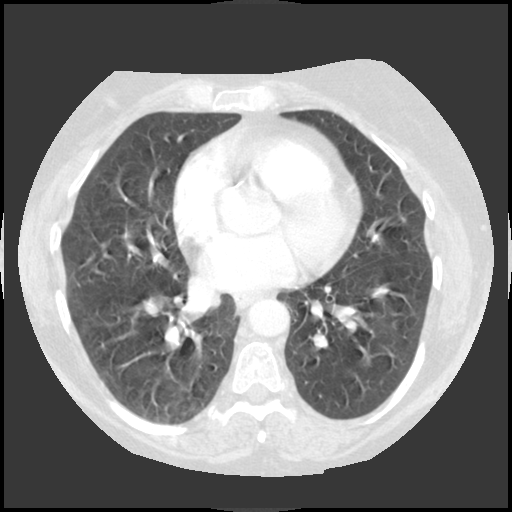
[im 197/413  lung]
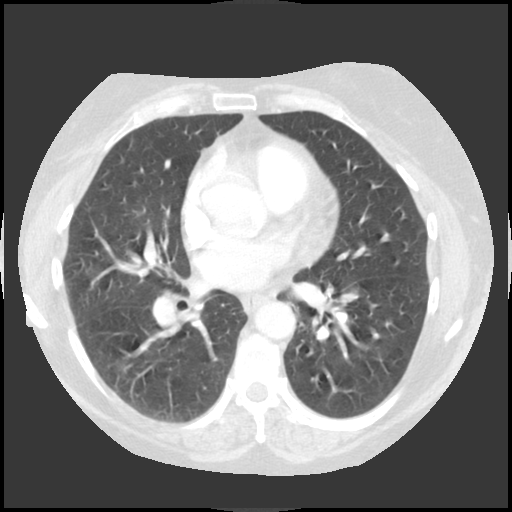
[im 216/413  mediastinal]
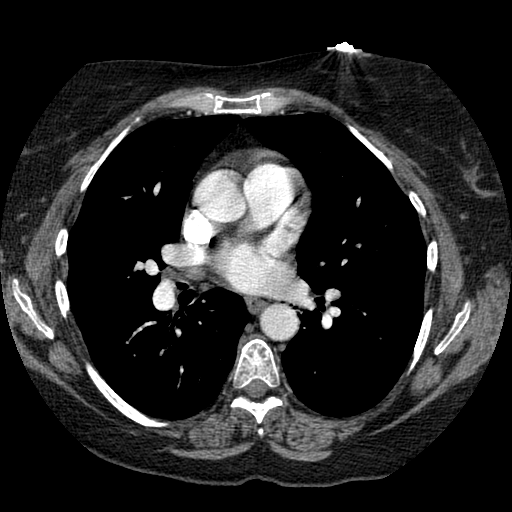
[im 216/413  lung]
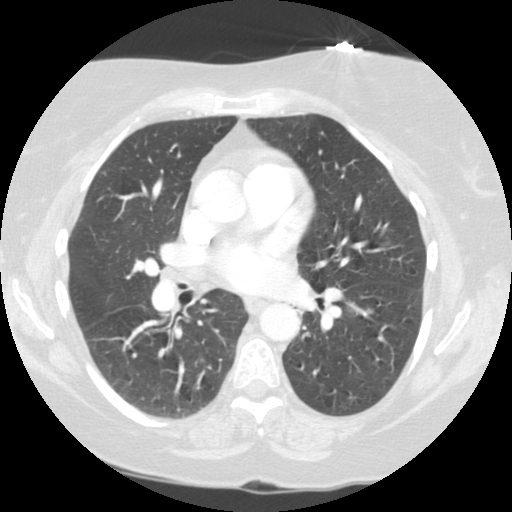
[im 236/413  lung]
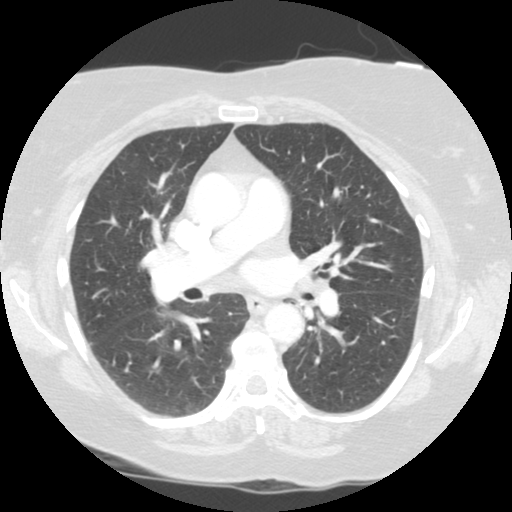
[im 275/413  lung]
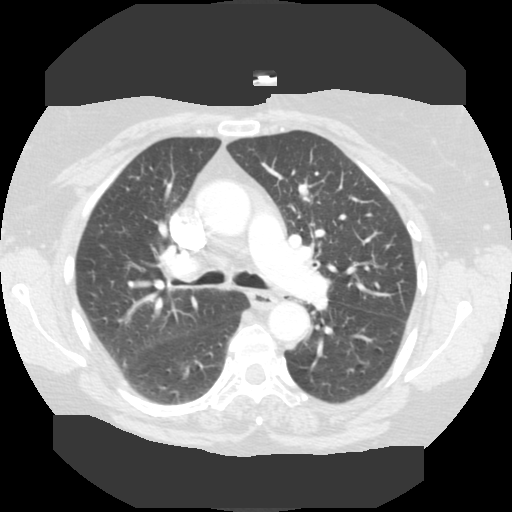
[im 295/413  lung]
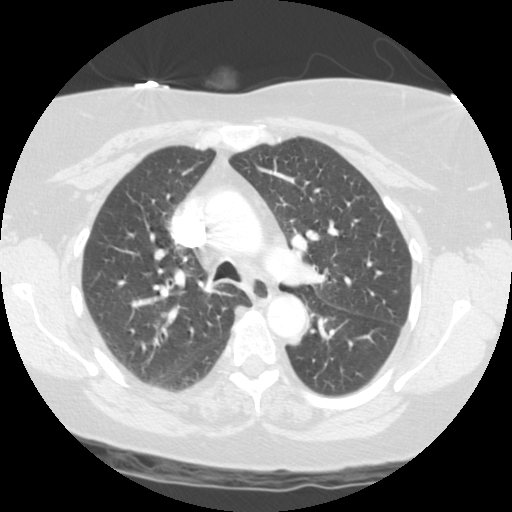
[im 314/413  mediastinal]
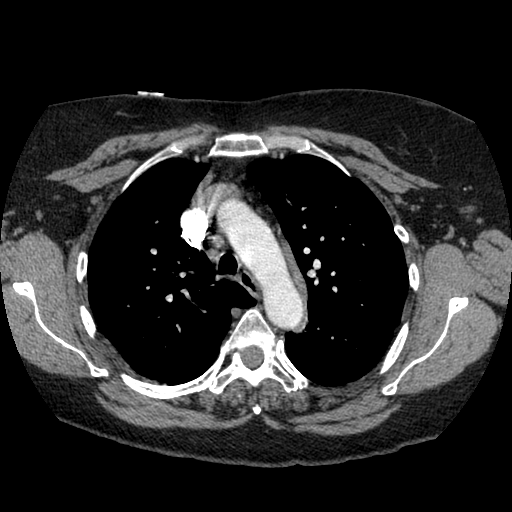
[im 314/413  lung]
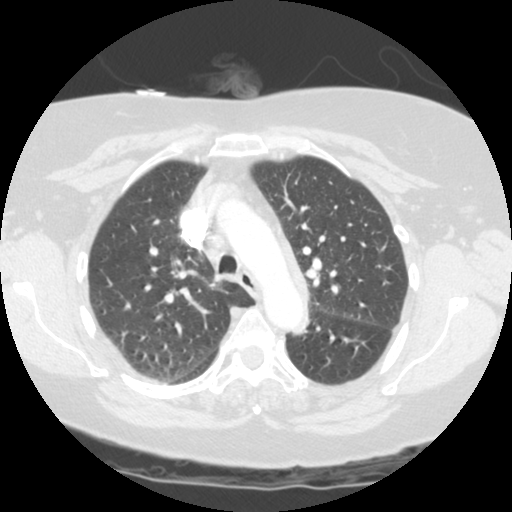
[im 334/413  lung]
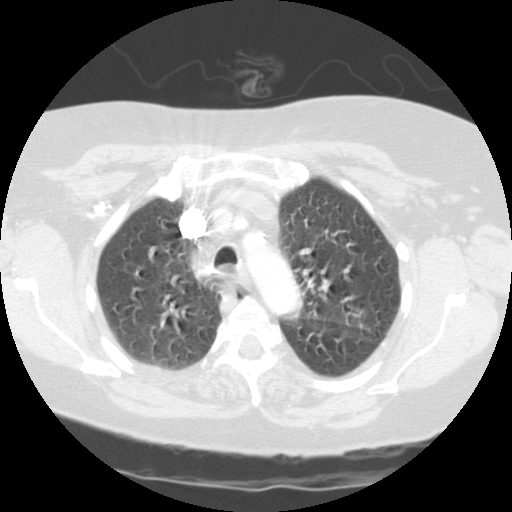
[im 373/413  lung]
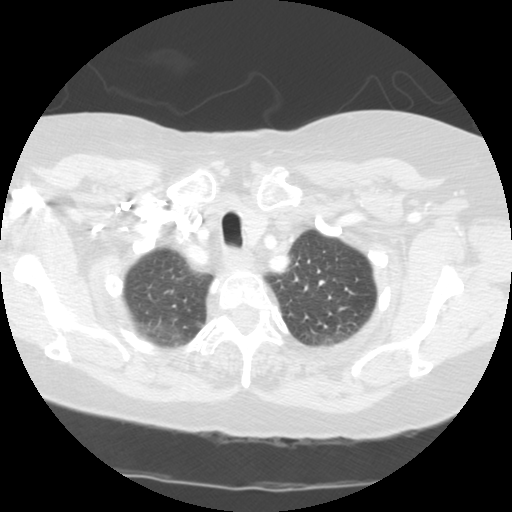
[im 393/413  lung]
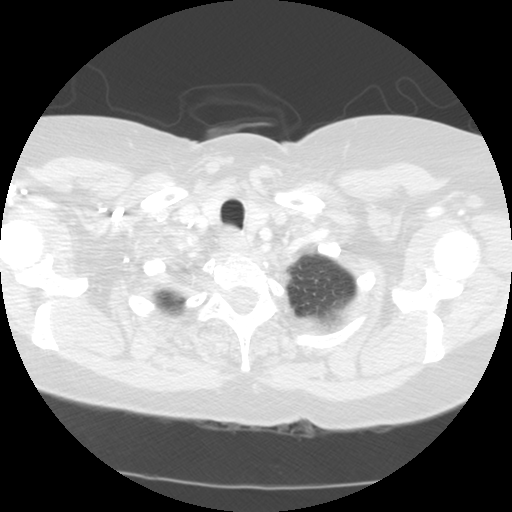

[16 of 28 positions shown; findings below may reference images not displayed]

## 2004-01-12 ENCOUNTER — Emergency Department (HOSPITAL_COMMUNITY): Admission: EM | Admit: 2004-01-12 | Discharge: 2004-01-12 | Payer: Self-pay | Admitting: Emergency Medicine

## 2004-01-15 ENCOUNTER — Emergency Department (HOSPITAL_COMMUNITY): Admission: EM | Admit: 2004-01-15 | Discharge: 2004-01-15 | Payer: Self-pay | Admitting: Emergency Medicine

## 2004-01-16 ENCOUNTER — Inpatient Hospital Stay (HOSPITAL_COMMUNITY): Admission: EM | Admit: 2004-01-16 | Discharge: 2004-01-23 | Payer: Self-pay | Admitting: Emergency Medicine

## 2004-01-17 ENCOUNTER — Encounter (INDEPENDENT_AMBULATORY_CARE_PROVIDER_SITE_OTHER): Payer: Self-pay | Admitting: Cardiology

## 2004-01-29 ENCOUNTER — Emergency Department (HOSPITAL_COMMUNITY): Admission: EM | Admit: 2004-01-29 | Discharge: 2004-01-29 | Payer: Self-pay | Admitting: Emergency Medicine

## 2004-02-01 ENCOUNTER — Emergency Department (HOSPITAL_COMMUNITY): Admission: EM | Admit: 2004-02-01 | Discharge: 2004-02-01 | Payer: Self-pay | Admitting: Emergency Medicine

## 2004-02-06 ENCOUNTER — Encounter: Admission: RE | Admit: 2004-02-06 | Discharge: 2004-02-06 | Payer: Self-pay | Admitting: Sports Medicine

## 2004-02-11 ENCOUNTER — Emergency Department (HOSPITAL_COMMUNITY): Admission: EM | Admit: 2004-02-11 | Discharge: 2004-02-11 | Payer: Self-pay | Admitting: Emergency Medicine

## 2004-02-14 IMAGING — CR DG FOOT COMPLETE 3+V*L*
2 series · 2 of 2 positions shown · non-contrast
Comparison: 12/28/02.

CLINICAL DATA: 62-year-old with shortness of breath.  Pain in heel.  Patient had surgery on bone spur in September 2003.  
LEFT FOOT, COMPLETE

[view not recorded (1 of 2)]
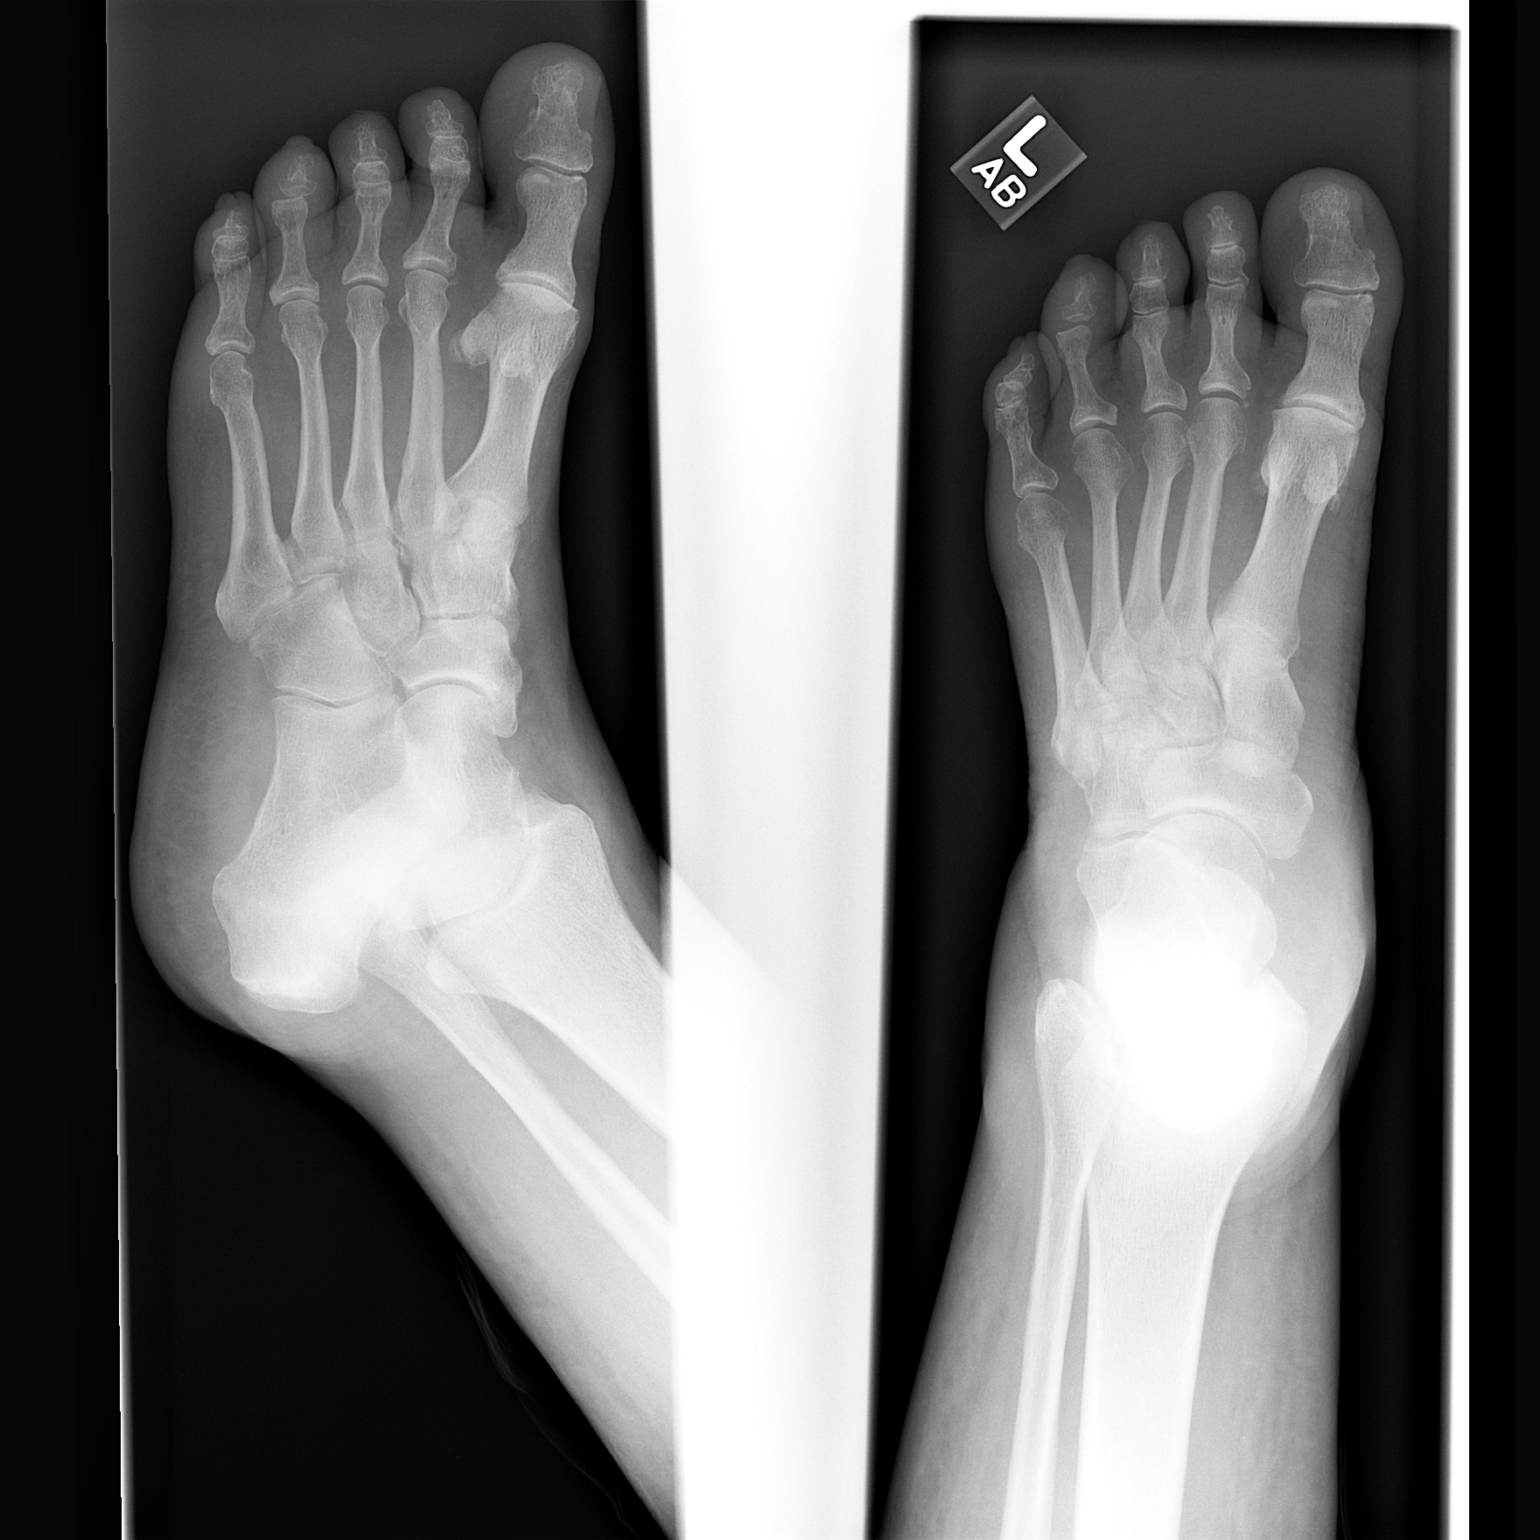

[view not recorded (2 of 2)]
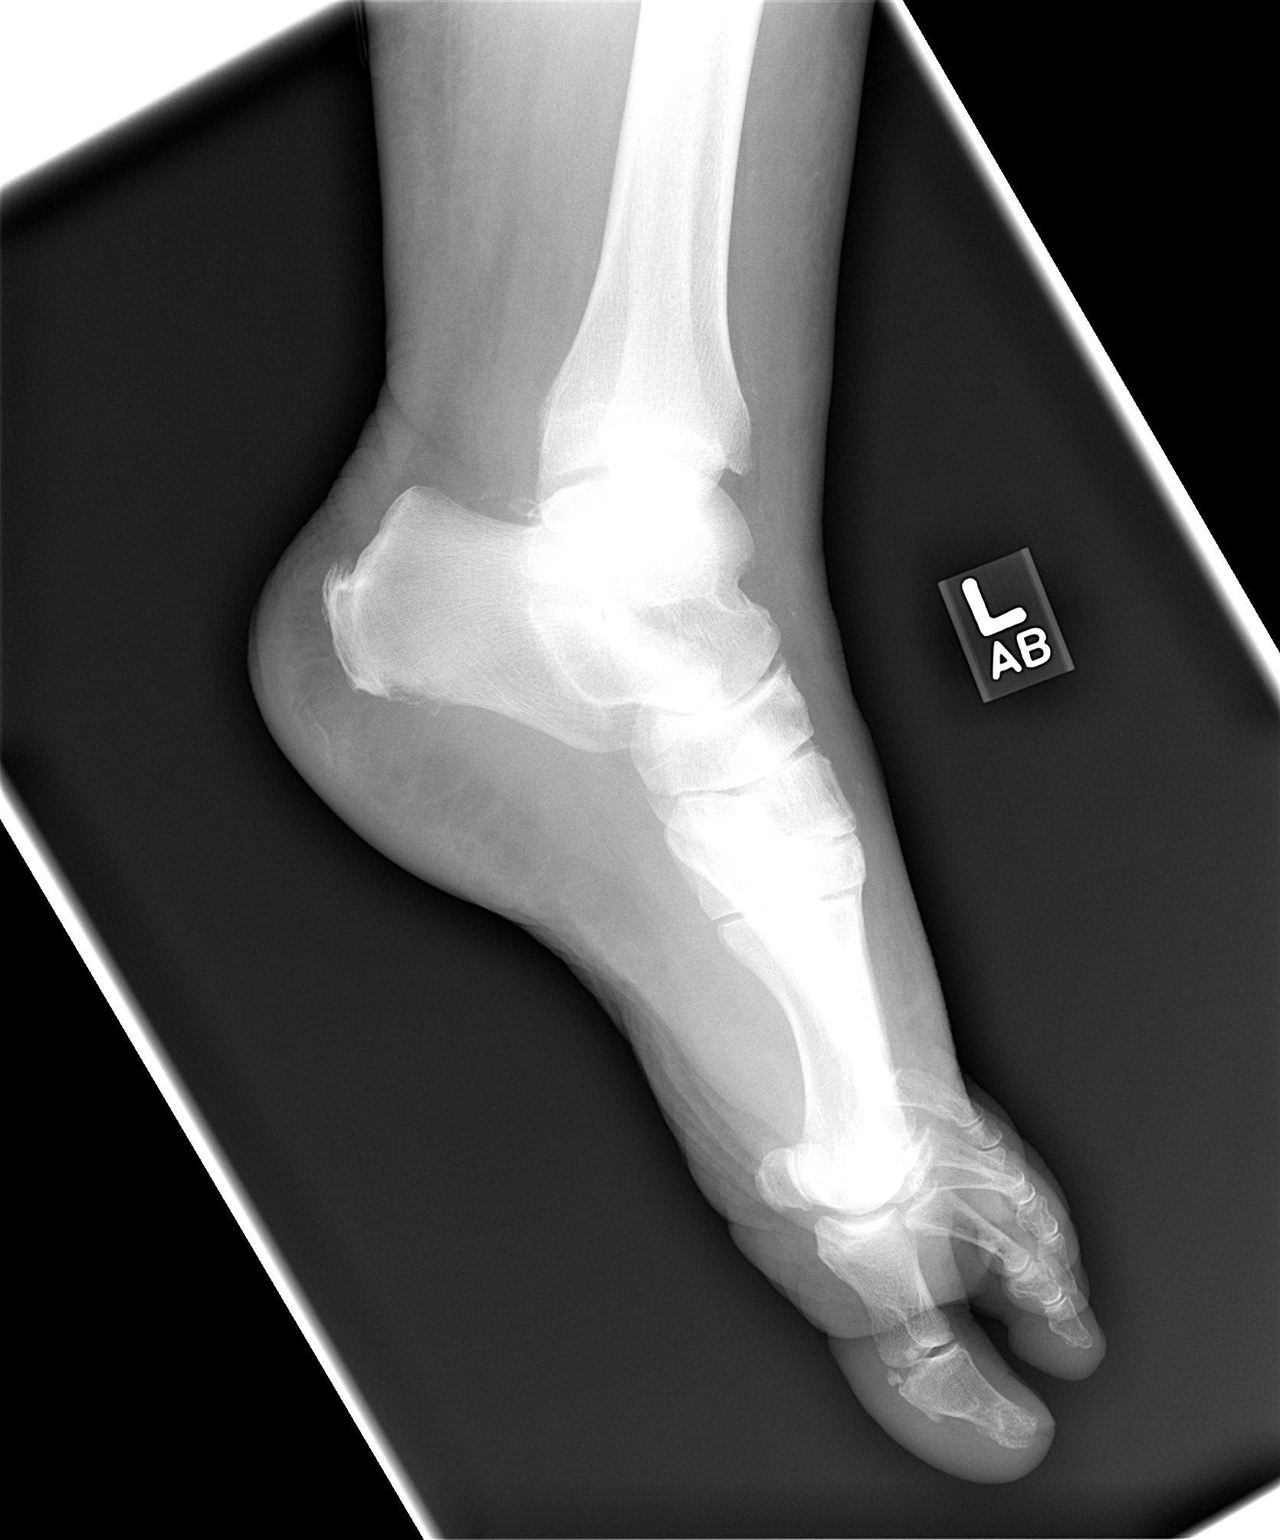

[2 of 2 positions shown; findings below may reference images not displayed]

Three views were performed of the left foot showing no evidence for acute fracture or dislocation.  There is minimal irregularity of the posterior inferior aspect of the calcaneus, likely in the site of the patient's recent surgery.  There is soft tissue swelling of the heel, but no evidence for acute fracture or dislocation.  Minimal irregularity of the tibiotalar joint is stable.  Small Achilles spur is also noted.  There is no soft tissue gas. 
IMPRESSION
There is mild irregularity along the plantar aspect of the calcaneus which is likely in the area of the patient's recent surgery.  It is impossible to exclude osteomyelitis, and if this is a clinical concern, tagged white blood cell study or bone scan could be helpful for further evaluation.

## 2004-02-17 IMAGING — CR DG CHEST 1V PORT
1 series · 1 of 1 positions shown · non-contrast
Comparison: none

CLINICAL DATA: dyspnea; history of asthma
 PORTABLE CHEST 
 Comparison to 12/14/03.
 Upper limits heart size and mild interstitial prominence is unchanged from the last study.  Mild fullness of the hilar regions are stable.  
 IMPRESSION
 No evidence of acute cardiopulmonary disease.
 Chronic interstitial changes and mild cardiomegaly.

[view not recorded]
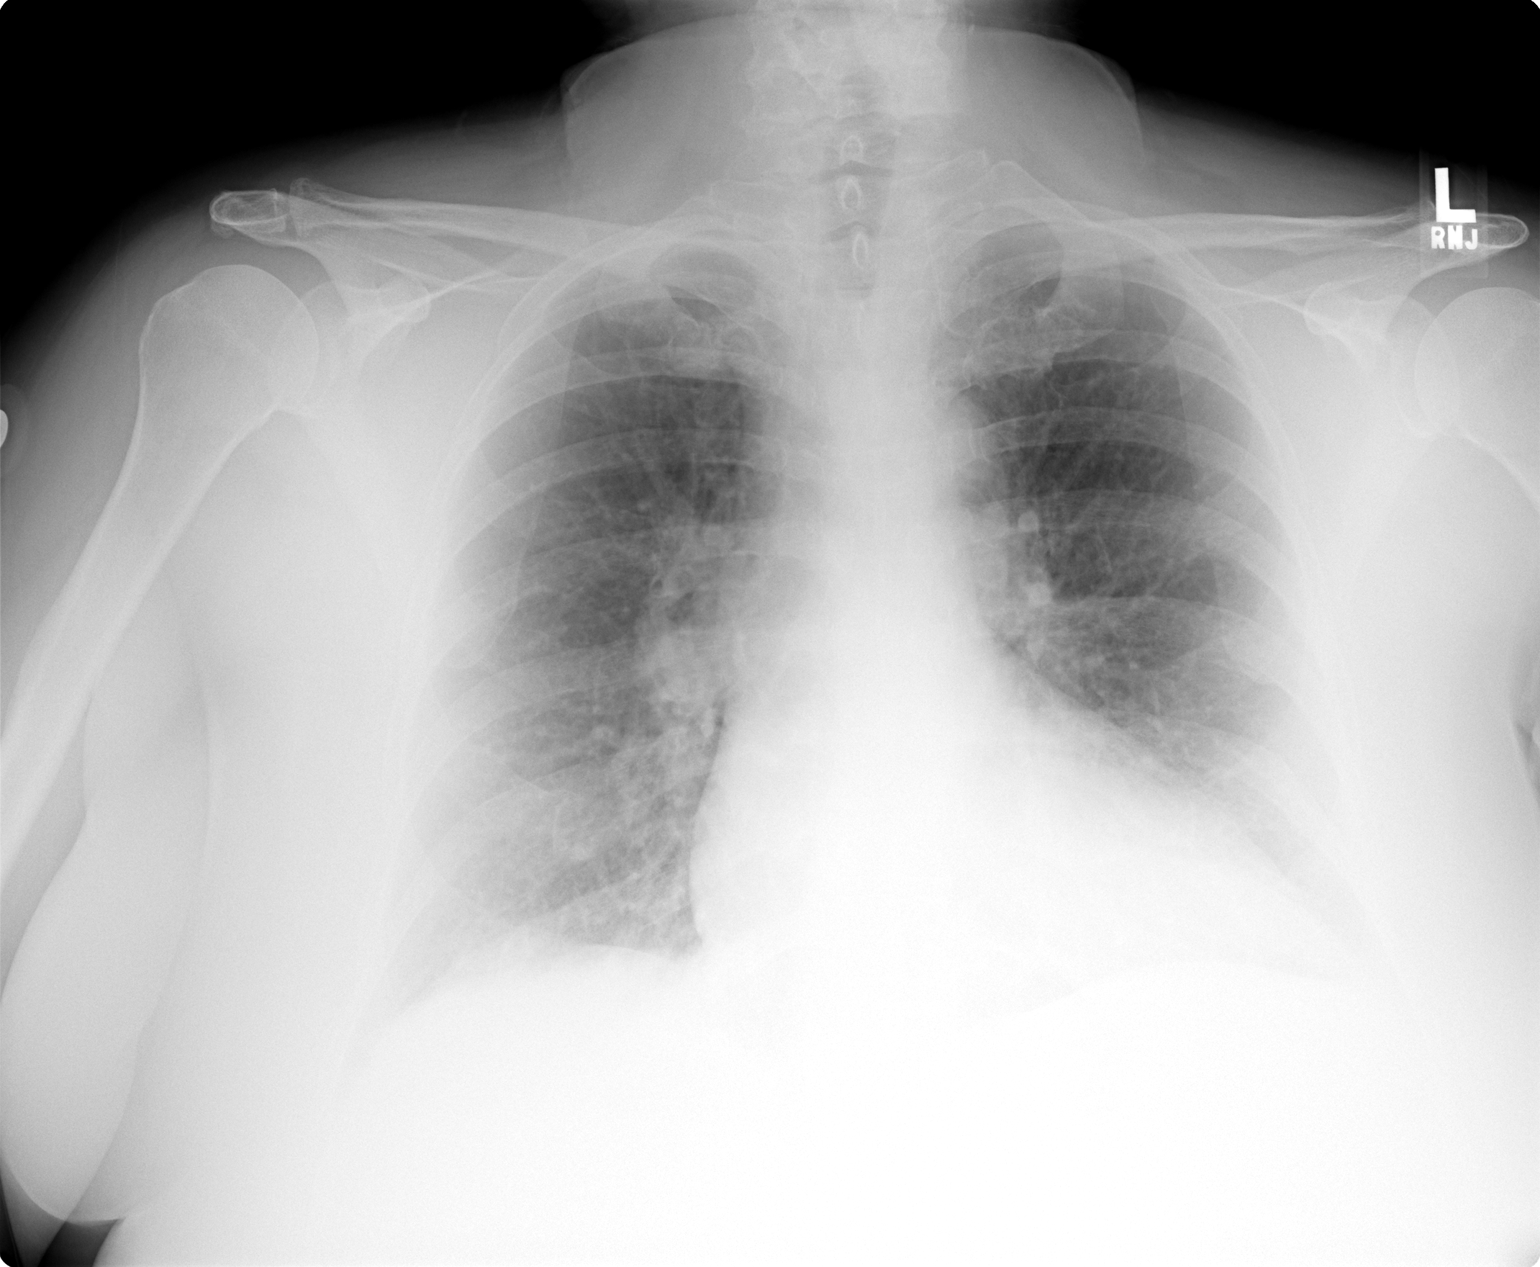

[1 of 1 positions shown; findings below may reference images not displayed]

## 2004-02-18 IMAGING — CT CT CHEST W/ CM
3 series · 14 of 29 positions shown, 15 images · IV contrast (omnipaque)
Comparison: none

CLINICAL DATA: Respiratory distress, shortness of breath.
 CT SCAN OF THE LOWER EXTREMITIES
 DVT protocol was used after the intravenous injection of 150 cc of Omnipaque 300.
 The lower inferior vena cava, iliac, femoral, and popliteal veins all appear normal.  The patient has had a previous hysterectomy.  There is no pelvic mass or adenopathy.  There is no bone abnormality.
 IMPRESSION
 No evidence for Deep vein thrombosis.
 CT SCAN OF THE CHEST WITH CONTRAST
 Comparison is made with the prior chest CT.
 There is some pleural and parenchymal scarring in the posterior medial aspect of the right lower lobe.  On image #69, the focal area of change measures 24 x 9 mm.  There is also a new small area of infiltrate within the right lung apex.  The lymph node posterior to the proximal right main pulmonary artery measures 22 x 15 mm.  There is also a subcentimeter node in the inferior right hilum.  There are subcentimeter paratracheal nodes.  The pulmonary arteries appear normal throughout.  There is no effusion.  No bone abnormality is noted.
 1.  No evidence for pulmonary embolic disease with a normal aorta.
 2.  New small area of infiltrate within the right lung apex.
 3.  Pleural and parenchymal scarring in the inferior medial aspect of the right lower lobe.  In view of the right hilar and mediastinal adenopathy, I would recommend a 4-6 month follow-up chest CT to reevaluate this area.

[Series 2: dvt 5.0 b30s · axial · 0.74mm/px · z∈[-716,-506]mm · 2 of 38 slices shown, 3 images]
[im 19/38  mediastinal]
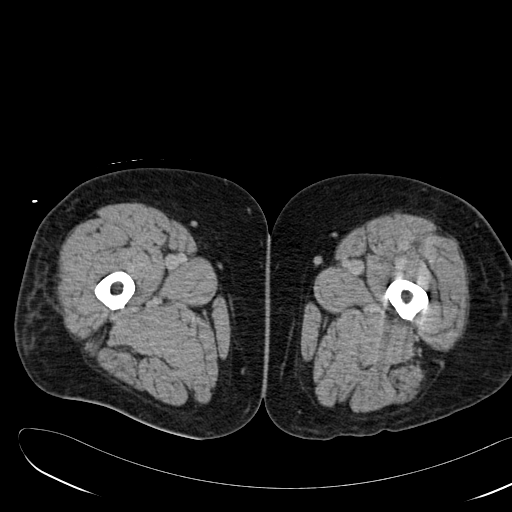
[im 19/38  lung]
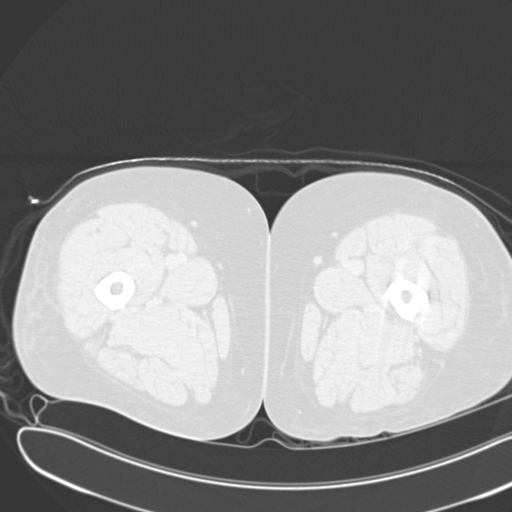
[im 33/38  lung]
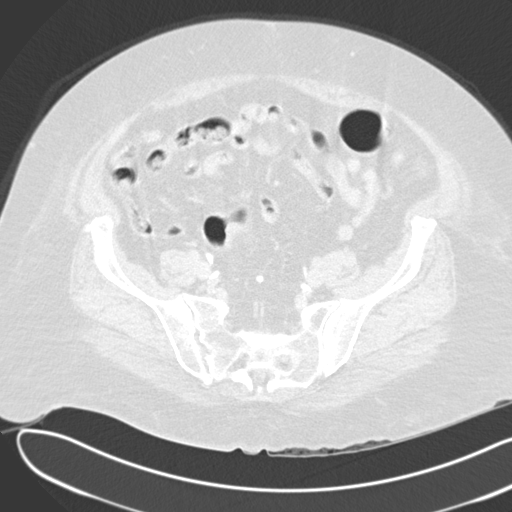

[Series 3: chest/pe 3.0 b30f · axial · 0.74mm/px · z∈[-224,-83]mm · 4 of 79 slices shown]
[im 16/79  lung]
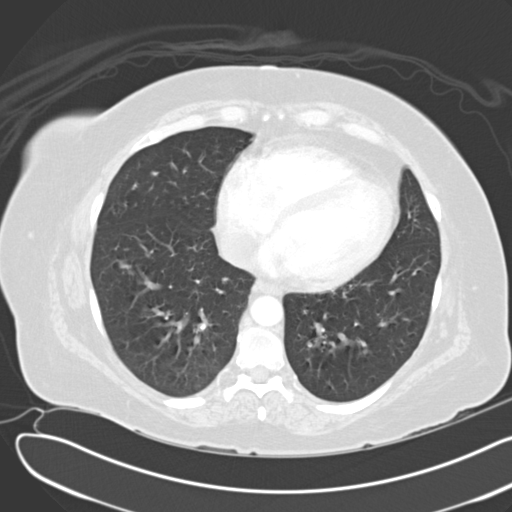
[im 32/79  lung]
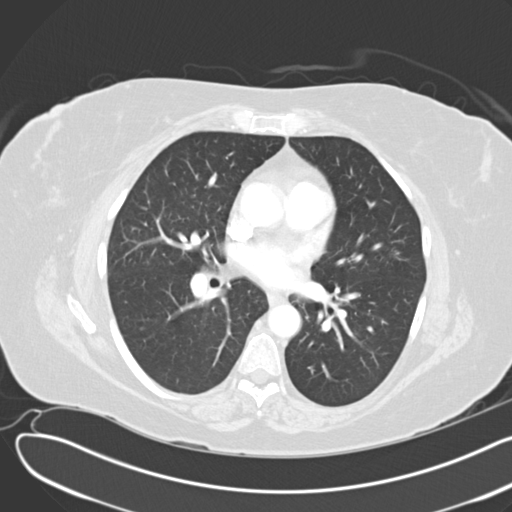
[im 47/79  lung]
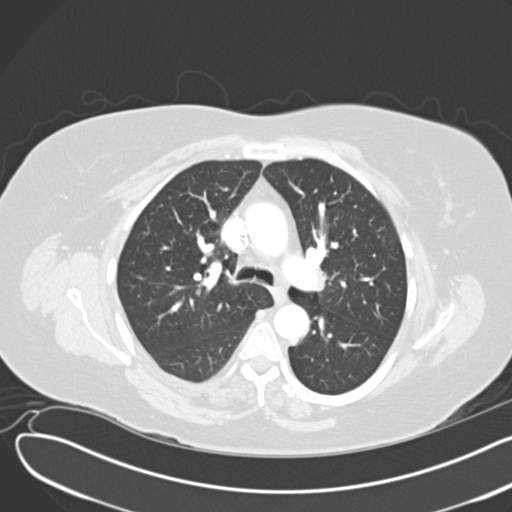
[im 63/79  lung]
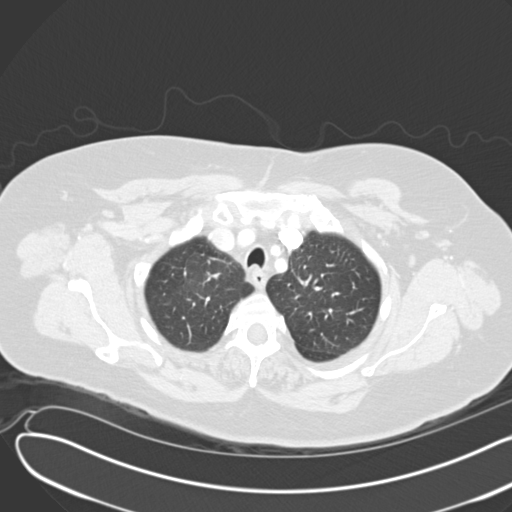

[Series 5: chest/pe 1.0 b10f · axial · 0.74mm/px · z∈[-244,-61]mm · 8 of 237 slices shown]
[im 27/237  lung]
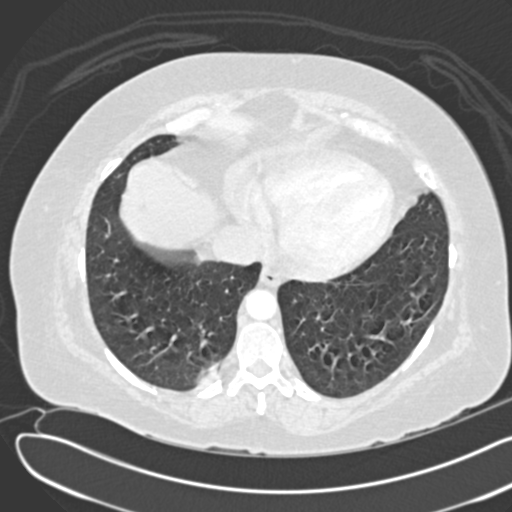
[im 53/237  lung]
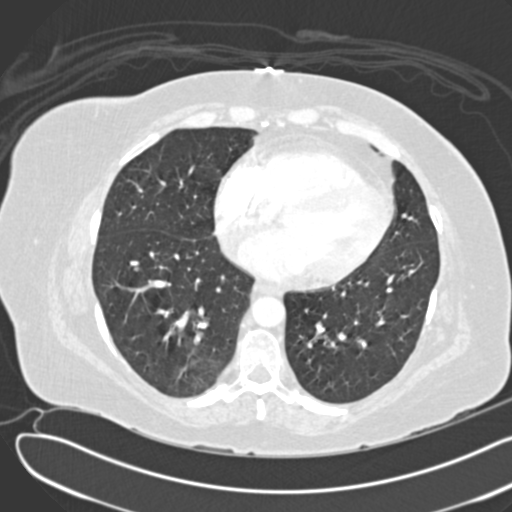
[im 79/237  lung]
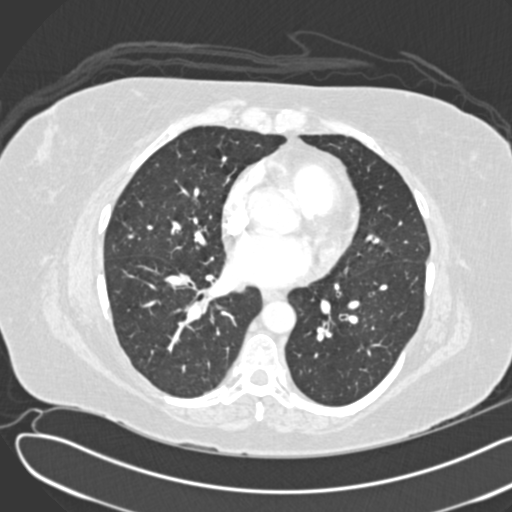
[im 105/237  lung]
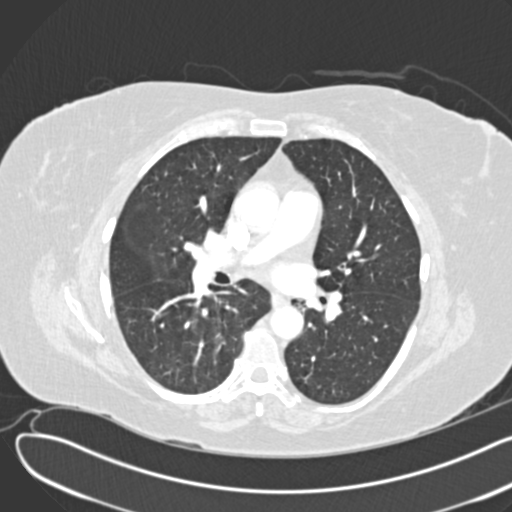
[im 132/237  lung]
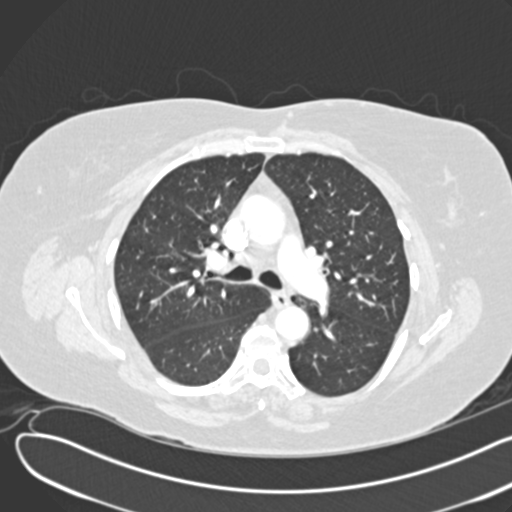
[im 158/237  lung]
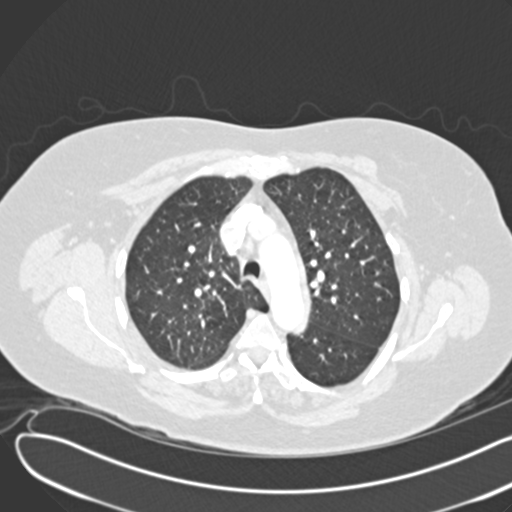
[im 184/237  lung]
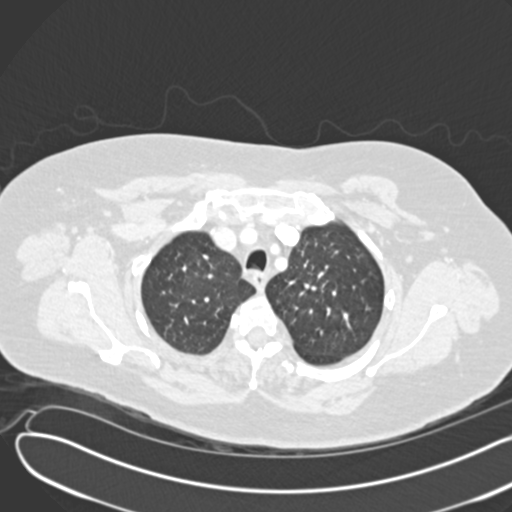
[im 210/237  lung]
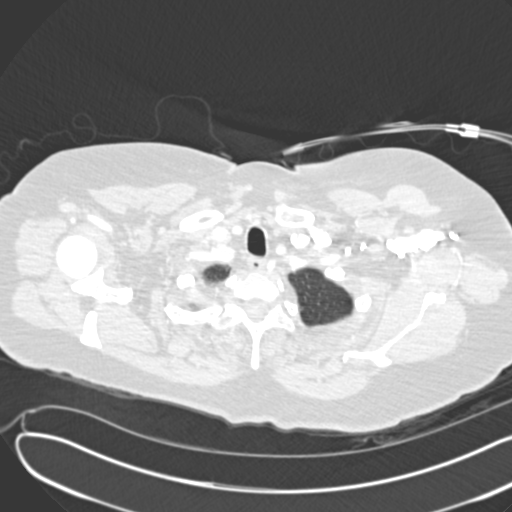

[14 of 29 positions shown; findings below may reference images not displayed]

## 2004-02-21 ENCOUNTER — Encounter: Admission: RE | Admit: 2004-02-21 | Discharge: 2004-02-21 | Payer: Self-pay | Admitting: Sports Medicine

## 2004-02-27 ENCOUNTER — Encounter: Admission: RE | Admit: 2004-02-27 | Discharge: 2004-02-27 | Payer: Self-pay | Admitting: Family Medicine

## 2004-03-02 IMAGING — CR DG CHEST 2V
2 series · 2 of 2 positions shown · non-contrast
Comparison: Portable chest 01/15/04.

CLINICAL DATA: Cough, shortness of breath.  Prior smoker.
 CHEST, TWO VIEWS 01/29/04 AT [DATE] HOURS

[view not recorded (1 of 2)]
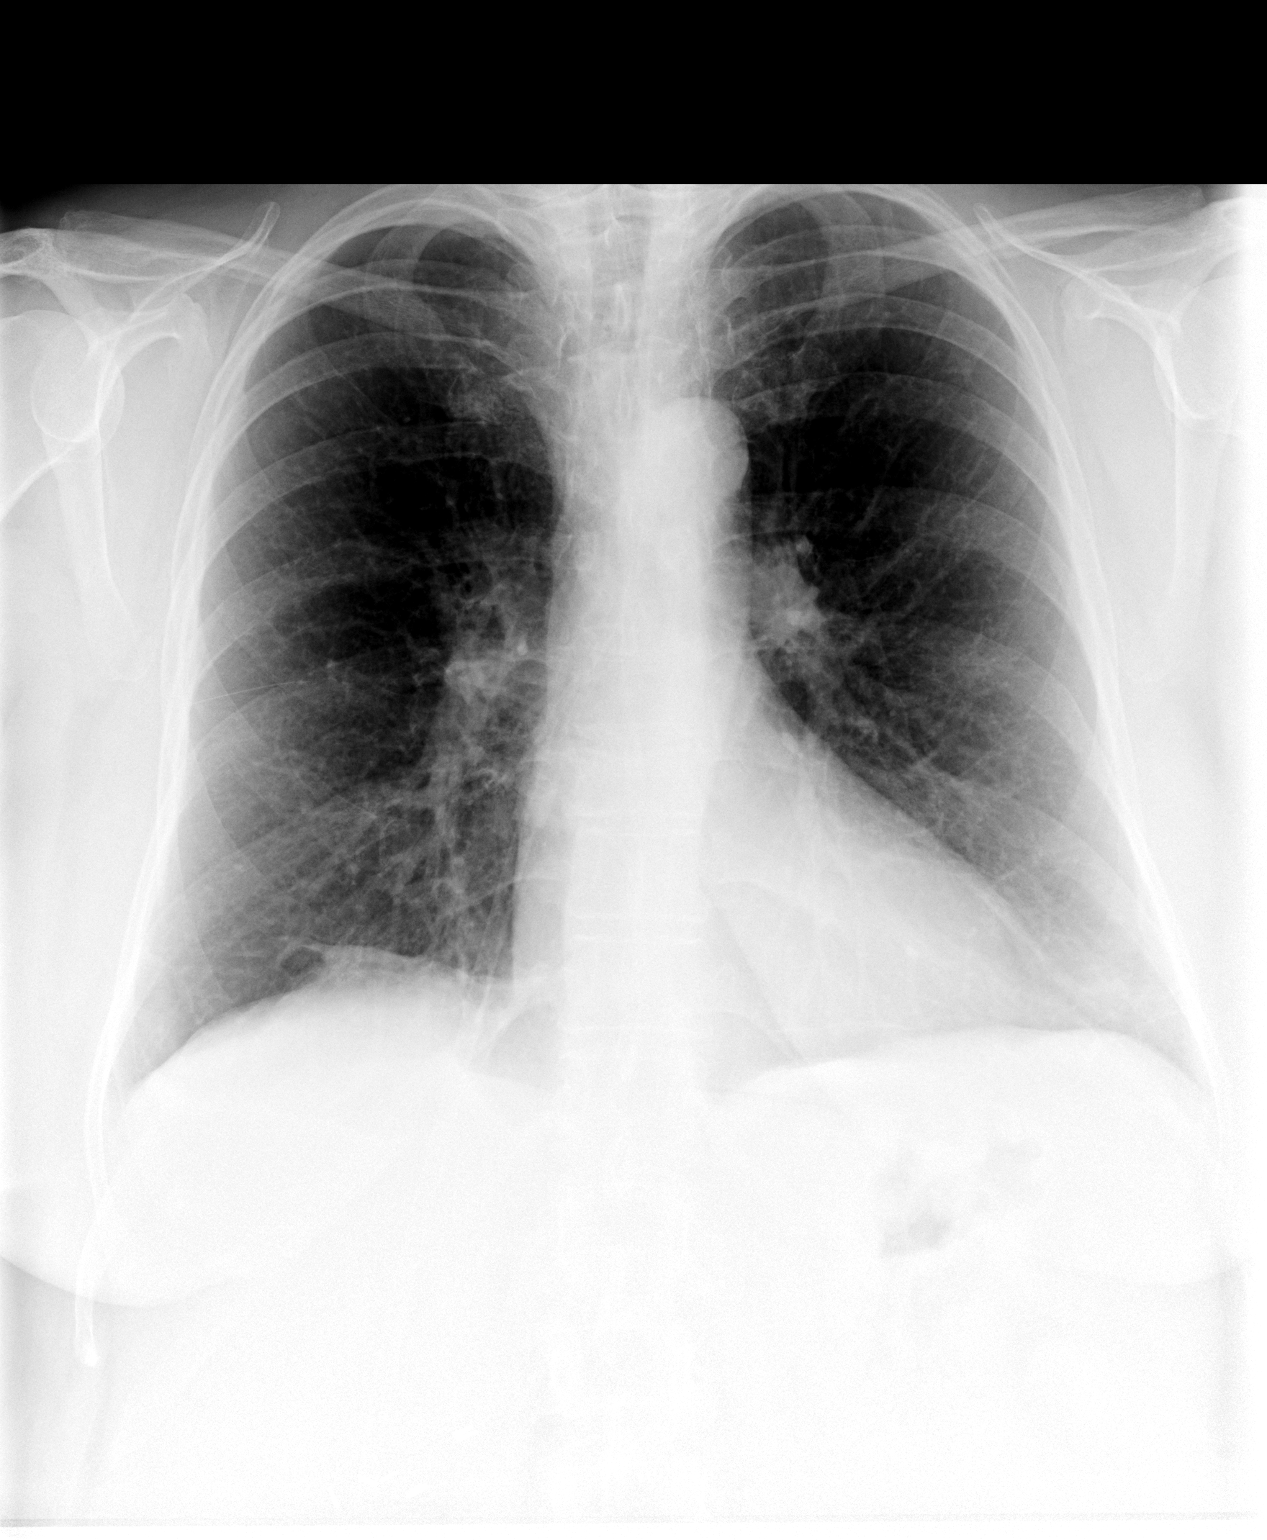

[view not recorded (2 of 2)]
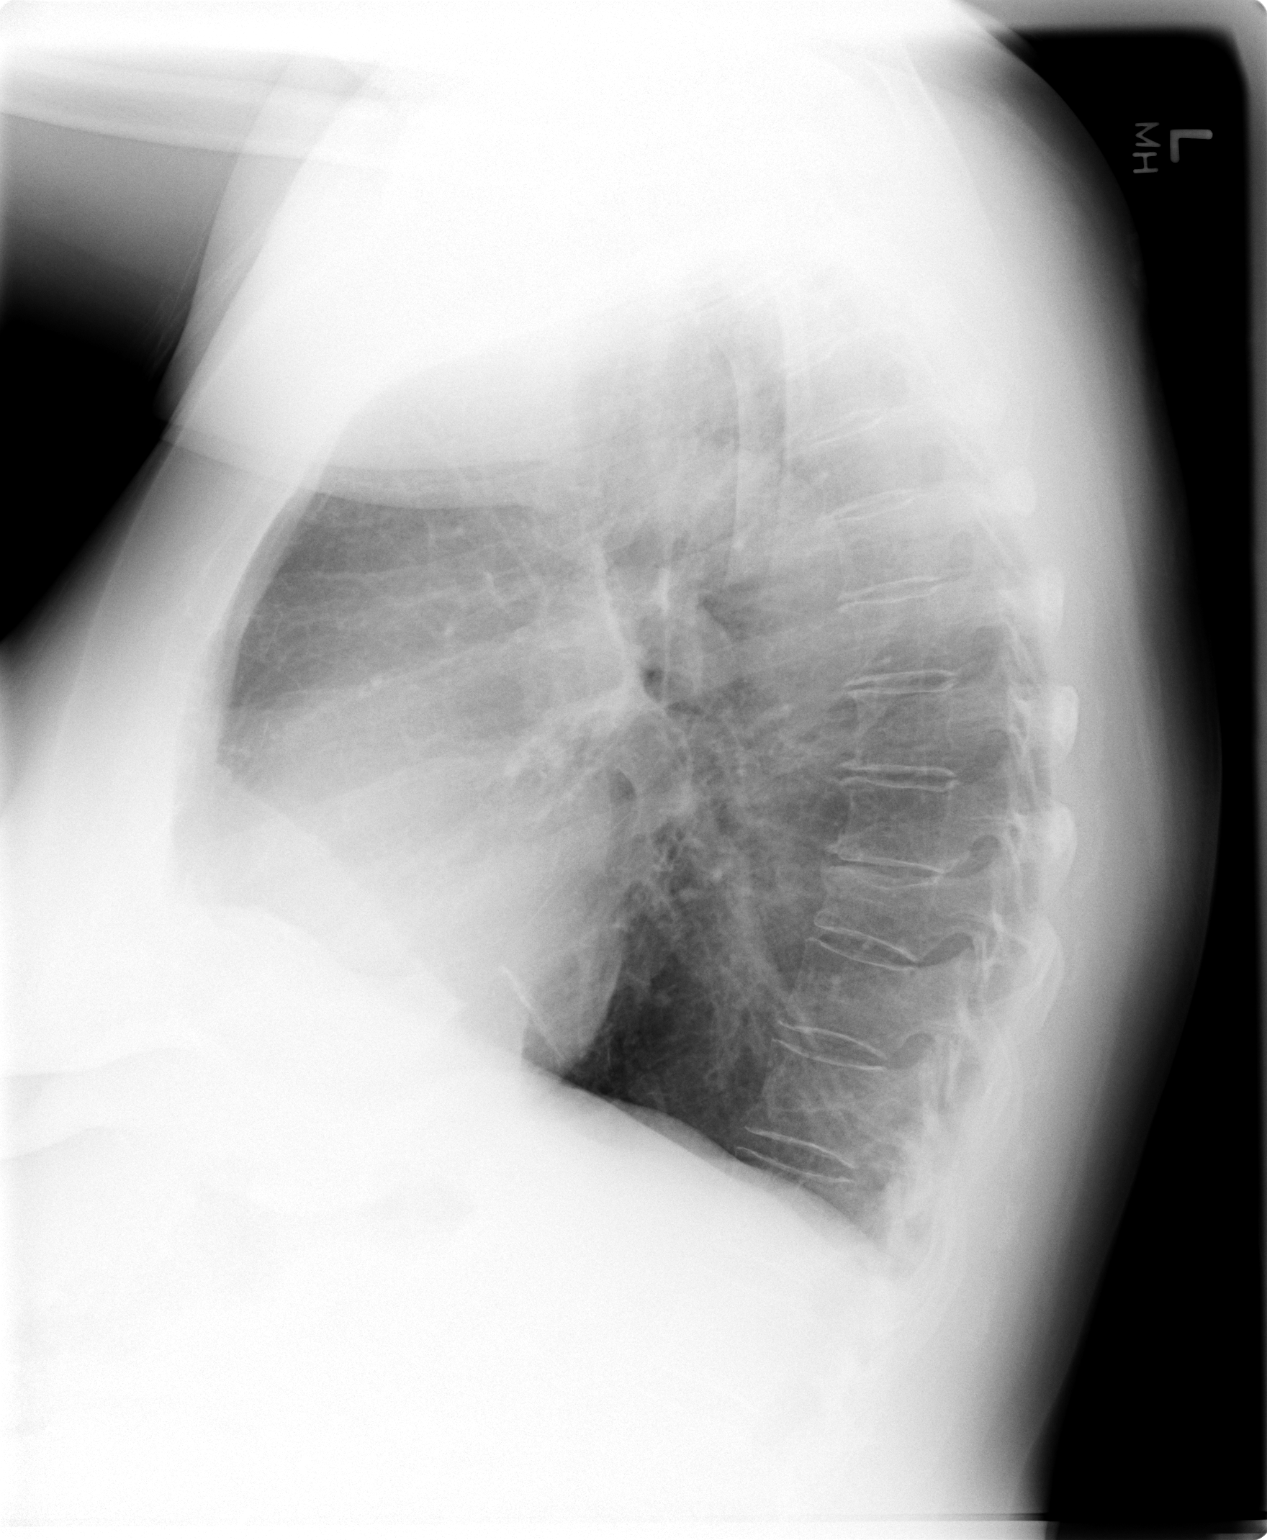

[2 of 2 positions shown; findings below may reference images not displayed]

Since that time, the patient has developed linear atelectasis in the right lower lobe.  The lungs remain clear otherwise.  Heart size is normal and stable.  Thoracic aorta is minimally tortuous, though unchanged. 
 IMPRESSION
 Develop of right lower lobe atelectasis since 01/15/04.

## 2004-03-05 IMAGING — CR DG CHEST 1V PORT
1 series · 1 of 1 positions shown · non-contrast
Comparison: none

CLINICAL DATA: Shortness of breath.
 PORTABLE CHEST - 02/01/04 AT 8522 HOURS
 Compared with a chest x-ray of 01/29/04, the lungs are now as well aerated.  No active infiltrate or effusion is seen.  The heart is within the upper limits of normal.  
 IMPRESSION
 Diminished aeration.  No active lung disease.

[view not recorded]
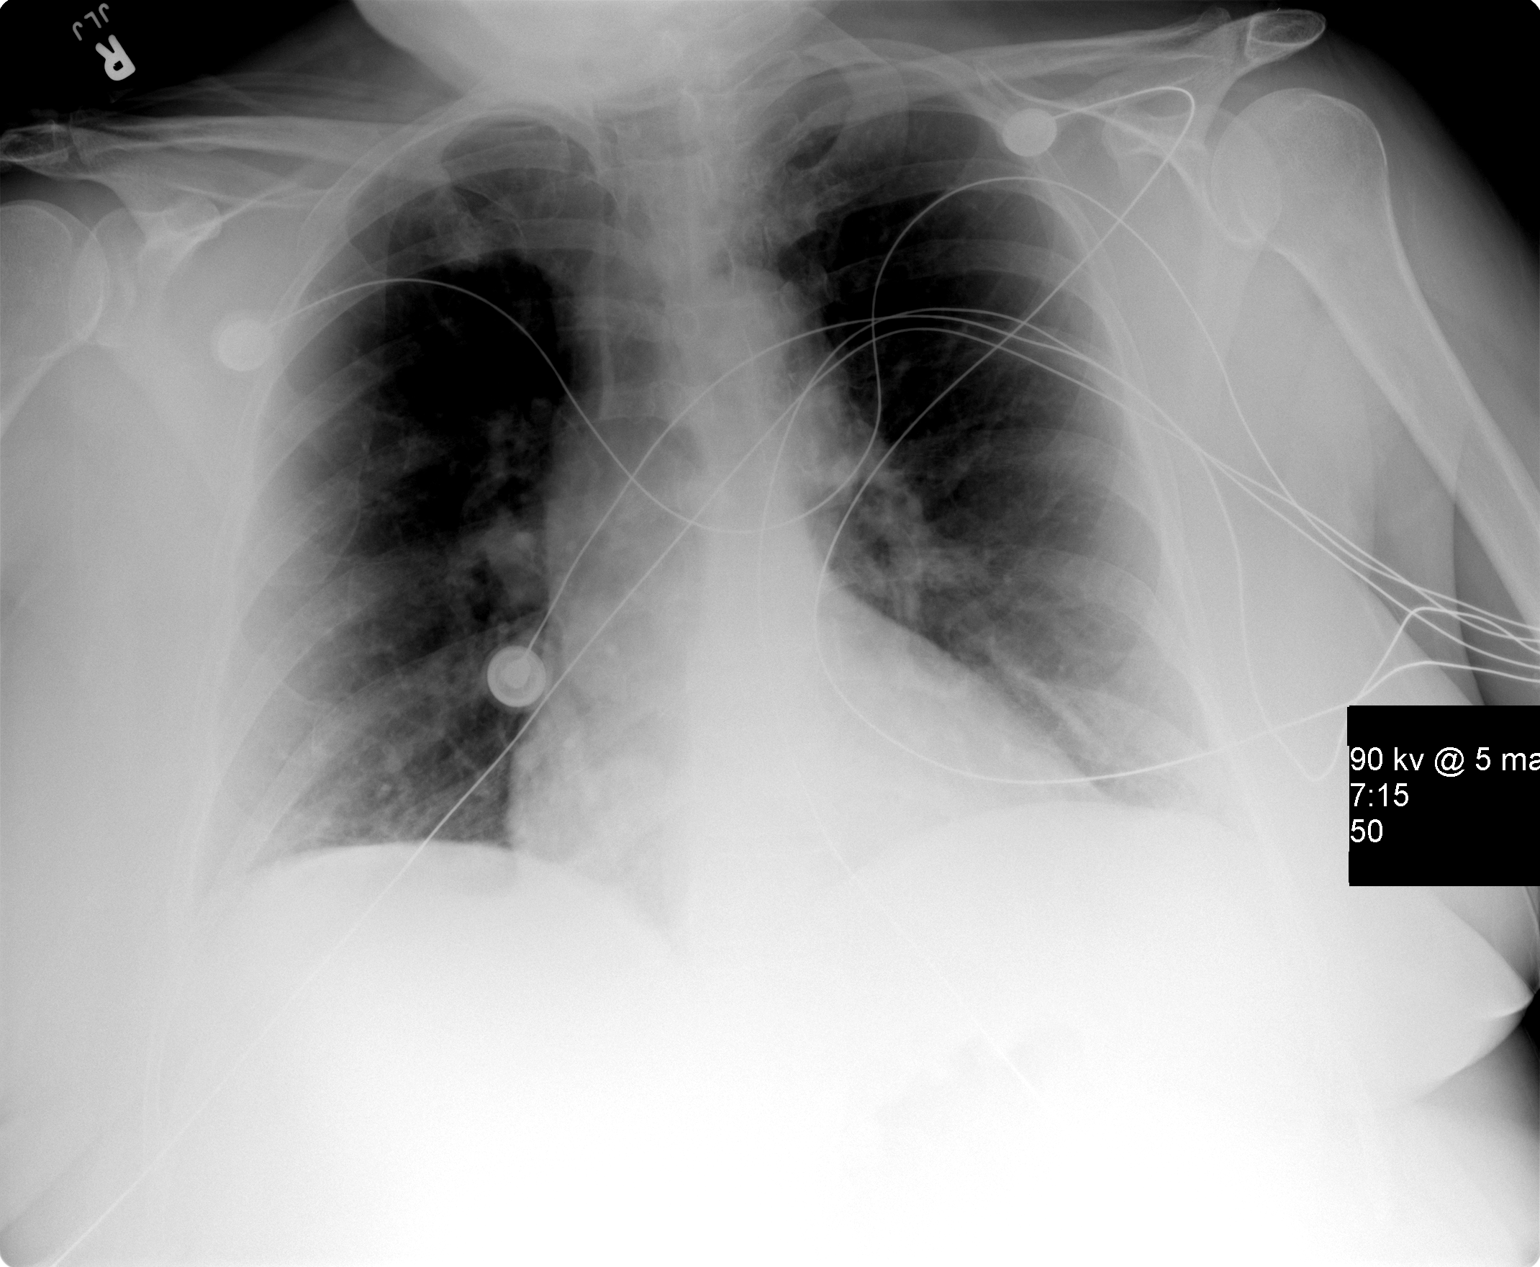

[1 of 1 positions shown; findings below may reference images not displayed]

## 2004-03-27 ENCOUNTER — Encounter: Admission: RE | Admit: 2004-03-27 | Discharge: 2004-03-27 | Payer: Self-pay | Admitting: Sports Medicine

## 2004-03-27 ENCOUNTER — Encounter: Admission: RE | Admit: 2004-03-27 | Discharge: 2004-03-27 | Payer: Self-pay | Admitting: Family Medicine

## 2004-03-30 ENCOUNTER — Encounter: Admission: RE | Admit: 2004-03-30 | Discharge: 2004-03-30 | Payer: Self-pay | Admitting: Family Medicine

## 2004-04-02 ENCOUNTER — Encounter: Admission: RE | Admit: 2004-04-02 | Discharge: 2004-04-02 | Payer: Self-pay | Admitting: Family Medicine

## 2004-04-10 ENCOUNTER — Encounter: Admission: RE | Admit: 2004-04-10 | Discharge: 2004-04-10 | Payer: Self-pay | Admitting: Family Medicine

## 2004-04-17 ENCOUNTER — Ambulatory Visit (HOSPITAL_COMMUNITY): Admission: RE | Admit: 2004-04-17 | Discharge: 2004-04-17 | Payer: Self-pay | Admitting: Family Medicine

## 2004-04-17 ENCOUNTER — Encounter: Payer: Self-pay | Admitting: Cardiovascular Disease

## 2004-04-27 ENCOUNTER — Encounter: Admission: RE | Admit: 2004-04-27 | Discharge: 2004-04-27 | Payer: Self-pay | Admitting: Family Medicine

## 2004-04-29 IMAGING — CR DG FOOT COMPLETE 3+V*L*
3 series · 3 of 3 positions shown · non-contrast
Comparison: none

CLINICAL DATA: Pain and swelling in region of fifth metatarsal.  Patient had a fall approximately one week ago.
 LEFT FOOT, COMPLETE
 Three views of the left foot are made and are compared to previous studies of 01/12/04 and show what appears to be an irregularity associated with the midportion of the proximal phalanx of the left fifth toe which appears to be associated with considerable soft tissue swelling of the dorsal aspect of the metatarsals.  There is also the question of a tiny avulsion fracture from the dorsal aspect of the cuneiform area.  There are small posterior superior calcaneal spurs present.  There are some degenerative arthritic changes associated with the first metatarsal phalangeal joint.  No foreign body or osteomyelitis is present.  
 IMPRESSION
 Soft tissue swelling dorsal aspect of the mid and distal foot with questionable 1 x 2 mm avulsive fracture from the dorsal aspect of the cuneiform area seen only on the lateral view and a questionable fracture of the mid portion of the proximal phalanx, left fifth toe.

[view not recorded (1 of 3)]
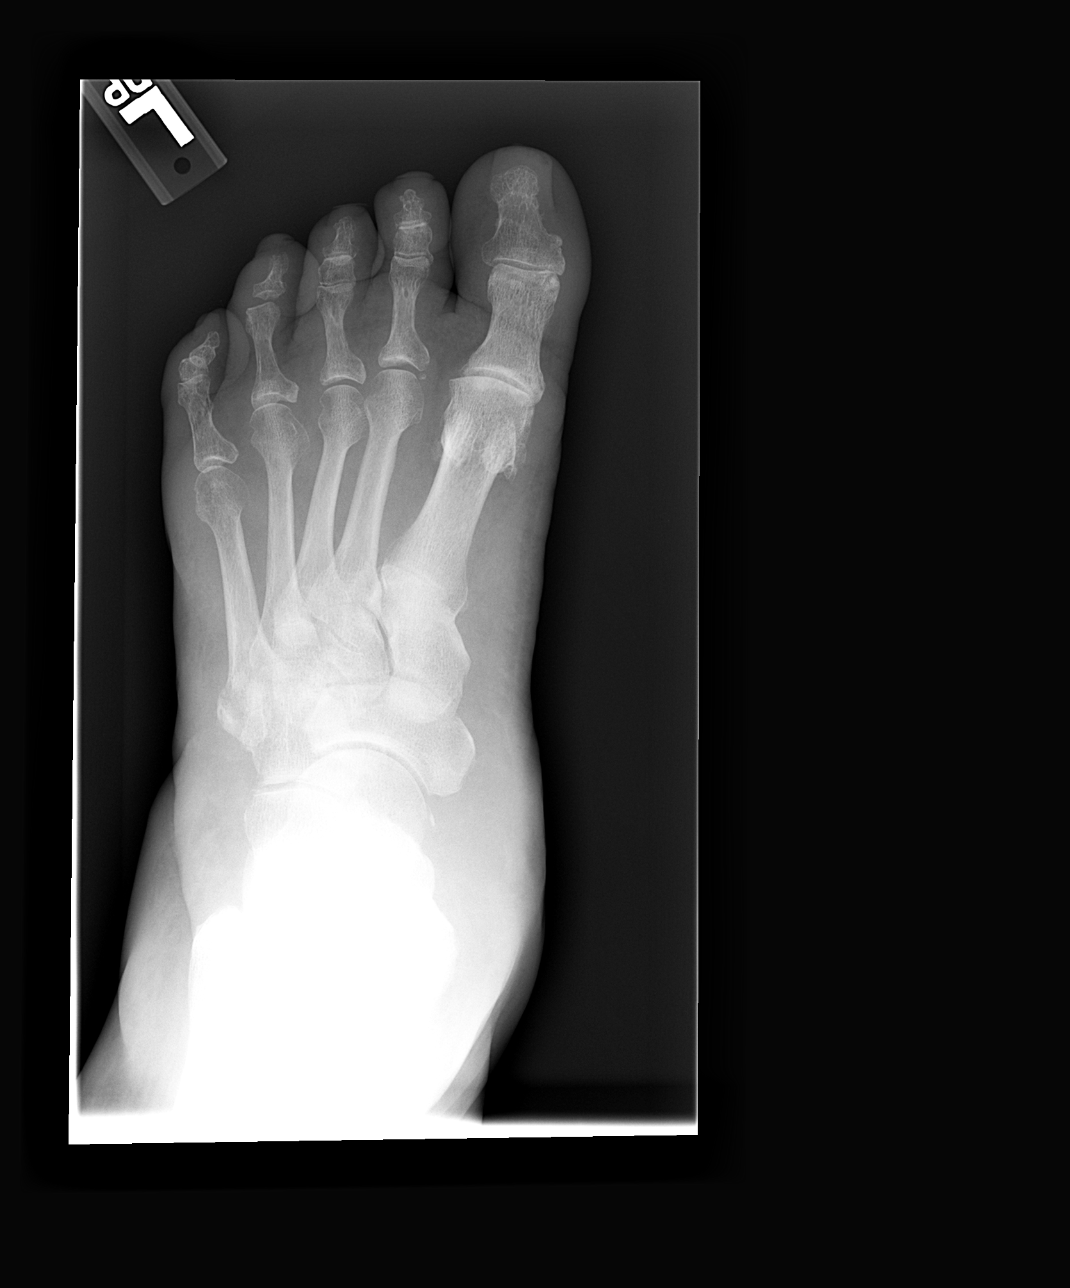

[view not recorded (2 of 3)]
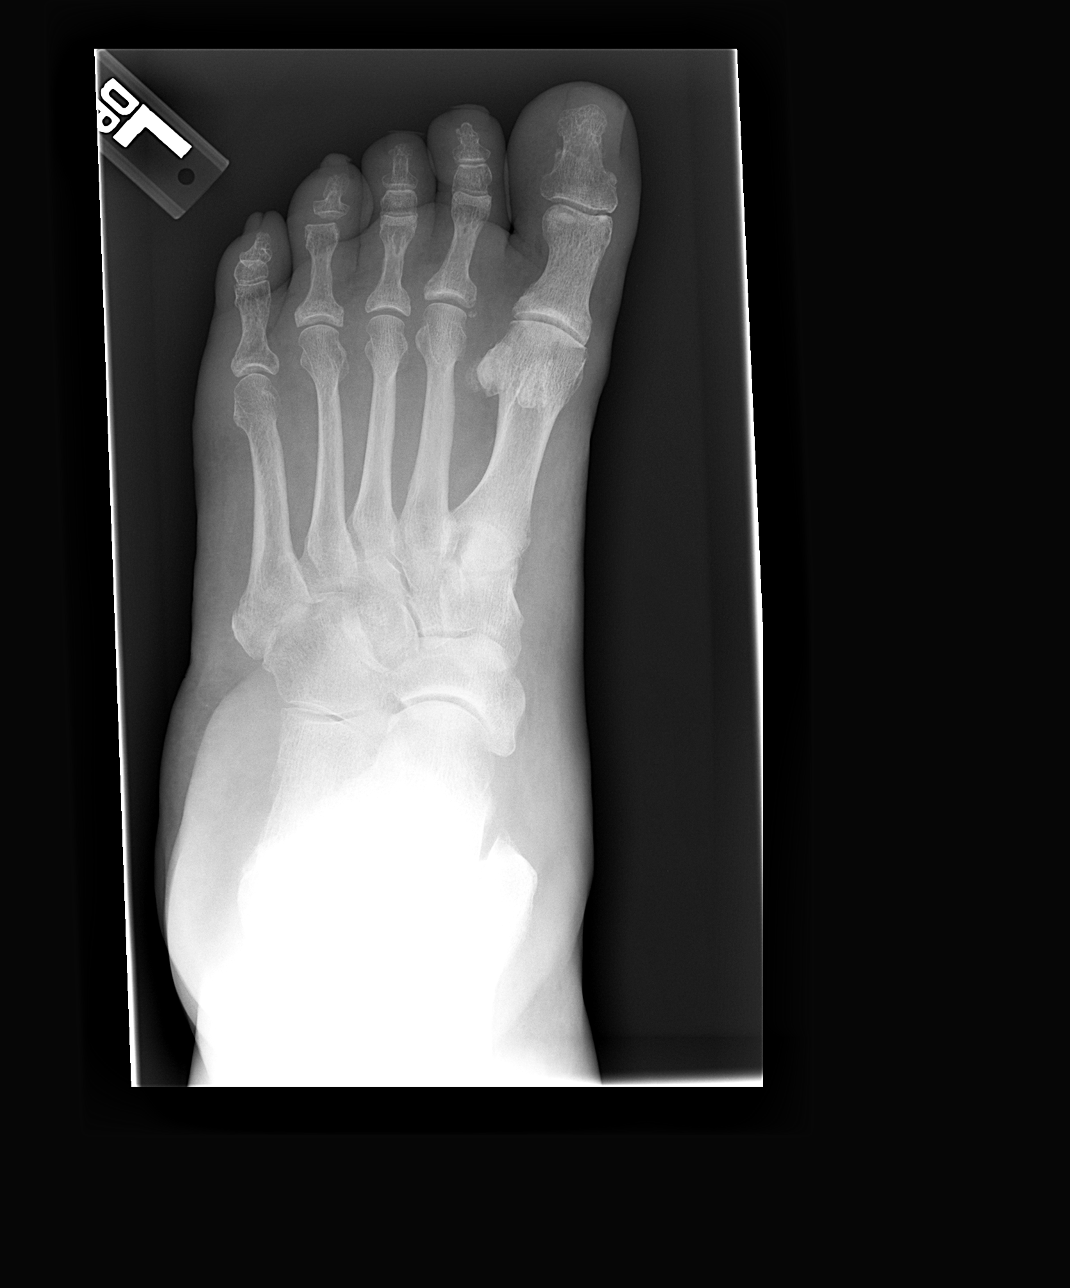

[view not recorded (3 of 3)]
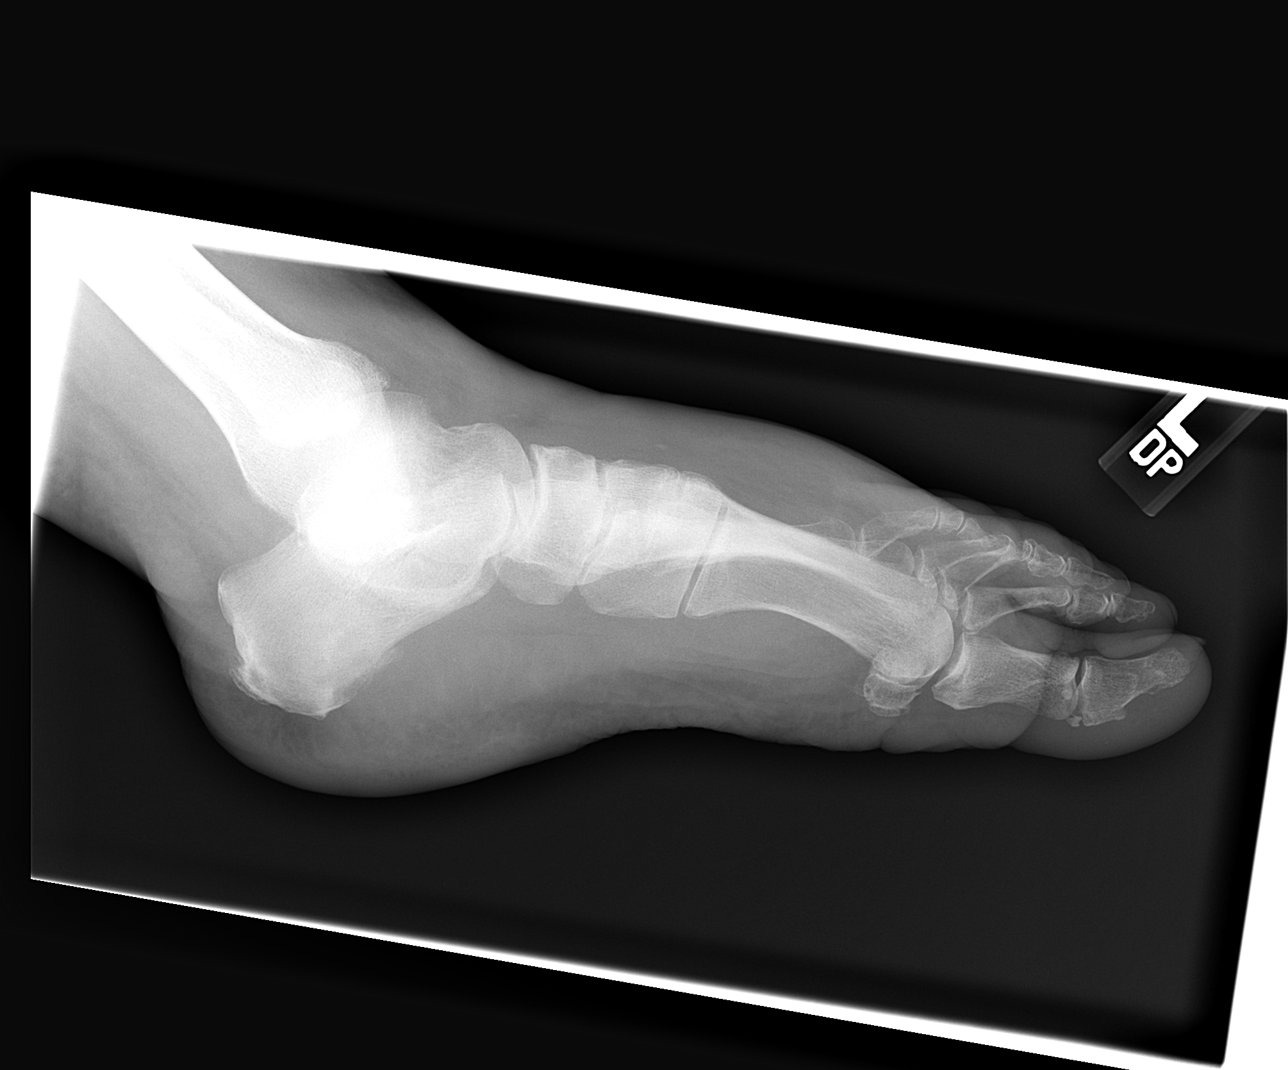

[3 of 3 positions shown; findings below may reference images not displayed]

## 2004-05-15 ENCOUNTER — Encounter: Admission: RE | Admit: 2004-05-15 | Discharge: 2004-05-15 | Payer: Self-pay | Admitting: Family Medicine

## 2004-06-08 ENCOUNTER — Encounter: Admission: RE | Admit: 2004-06-08 | Discharge: 2004-06-08 | Payer: Self-pay | Admitting: Family Medicine

## 2004-06-12 ENCOUNTER — Encounter: Admission: RE | Admit: 2004-06-12 | Discharge: 2004-06-12 | Payer: Self-pay | Admitting: Sports Medicine

## 2004-06-13 ENCOUNTER — Emergency Department (HOSPITAL_COMMUNITY): Admission: EM | Admit: 2004-06-13 | Discharge: 2004-06-13 | Payer: Self-pay | Admitting: Emergency Medicine

## 2004-06-22 ENCOUNTER — Emergency Department (HOSPITAL_COMMUNITY): Admission: EM | Admit: 2004-06-22 | Discharge: 2004-06-23 | Payer: Self-pay | Admitting: Emergency Medicine

## 2004-08-03 ENCOUNTER — Encounter: Admission: RE | Admit: 2004-08-03 | Discharge: 2004-08-03 | Payer: Self-pay | Admitting: Family Medicine

## 2004-08-31 ENCOUNTER — Encounter: Admission: RE | Admit: 2004-08-31 | Discharge: 2004-08-31 | Payer: Self-pay | Admitting: Family Medicine

## 2004-09-23 ENCOUNTER — Encounter: Admission: RE | Admit: 2004-09-23 | Discharge: 2004-09-23 | Payer: Self-pay | Admitting: Family Medicine

## 2004-09-26 ENCOUNTER — Emergency Department (HOSPITAL_COMMUNITY): Admission: EM | Admit: 2004-09-26 | Discharge: 2004-09-26 | Payer: Self-pay | Admitting: Emergency Medicine

## 2004-10-01 ENCOUNTER — Emergency Department (HOSPITAL_COMMUNITY): Admission: EM | Admit: 2004-10-01 | Discharge: 2004-10-01 | Payer: Self-pay | Admitting: Emergency Medicine

## 2004-10-06 ENCOUNTER — Ambulatory Visit: Payer: Self-pay | Admitting: Sports Medicine

## 2004-10-29 IMAGING — CR DG CHEST 2V
2 series · 2 of 2 positions shown · non-contrast
Comparison: 02/01/04.

CLINICAL DATA: Short of breath.  Cough for a week.  Hypertension. 
 CHEST (TWO VIEWS)

[view not recorded (1 of 2)]
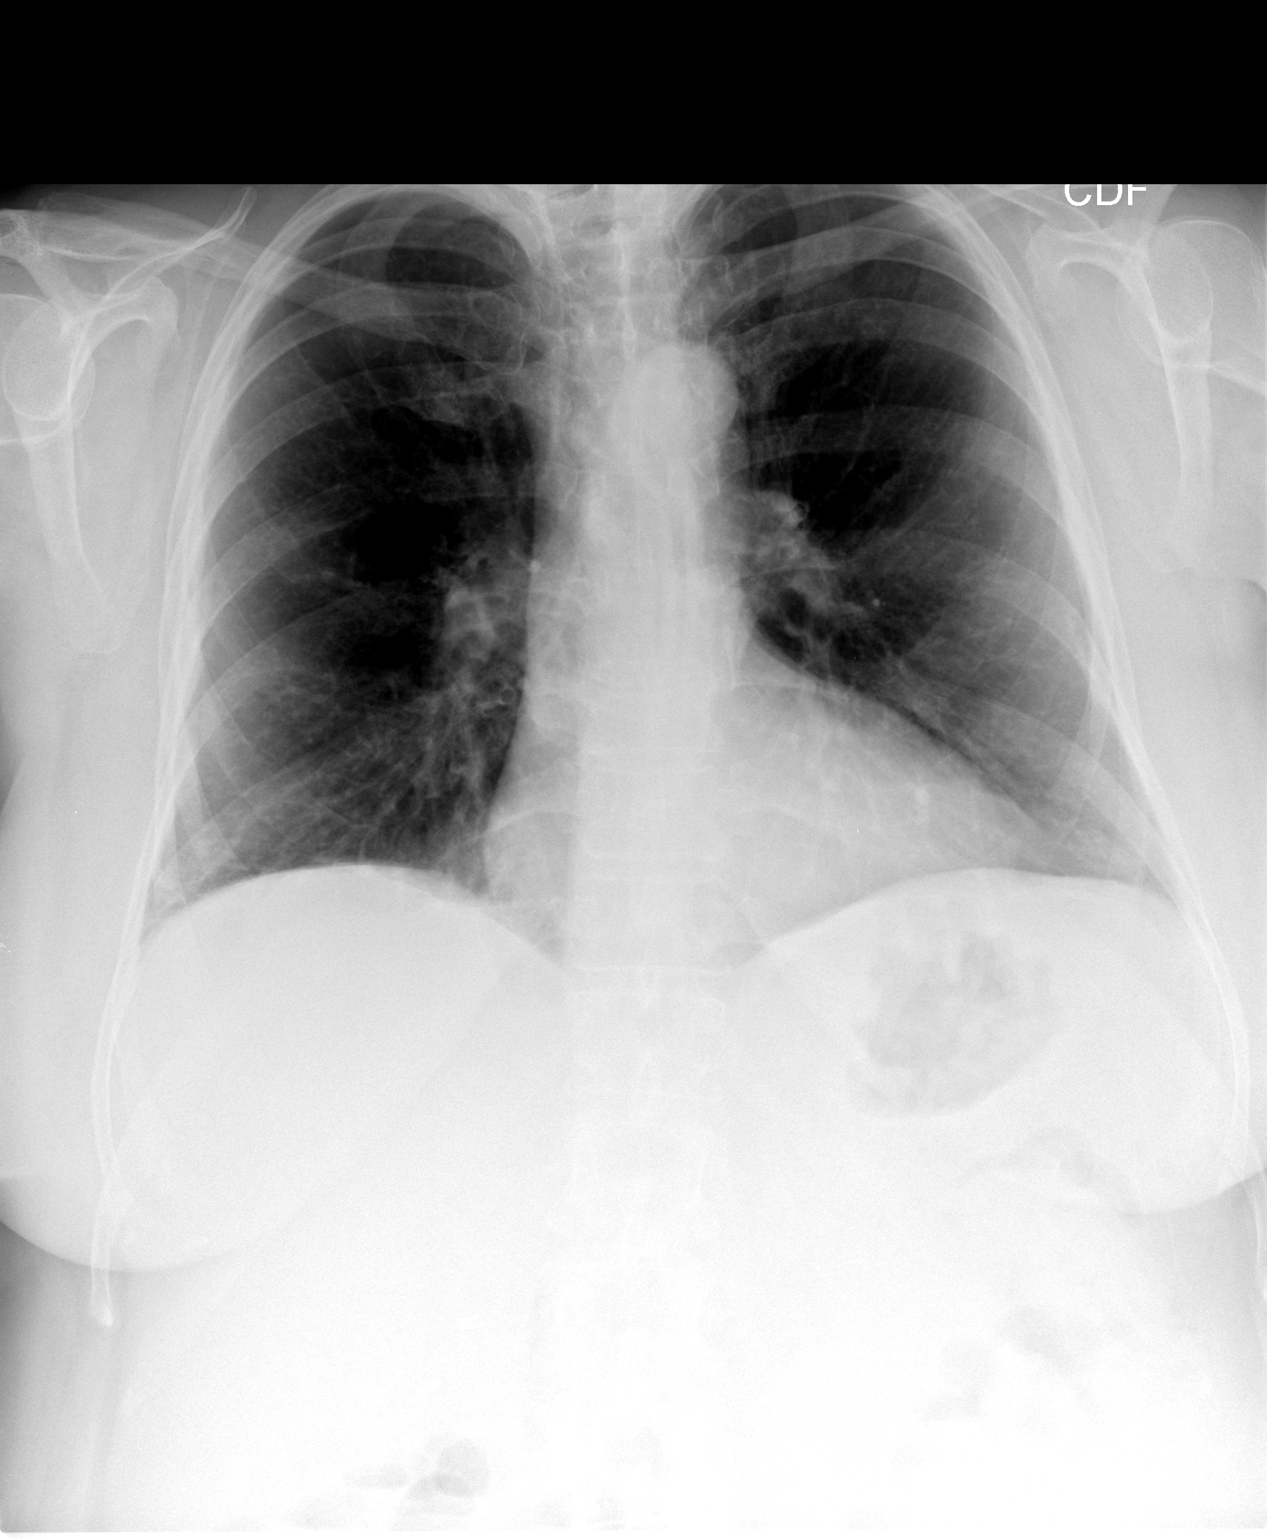

[view not recorded (2 of 2)]
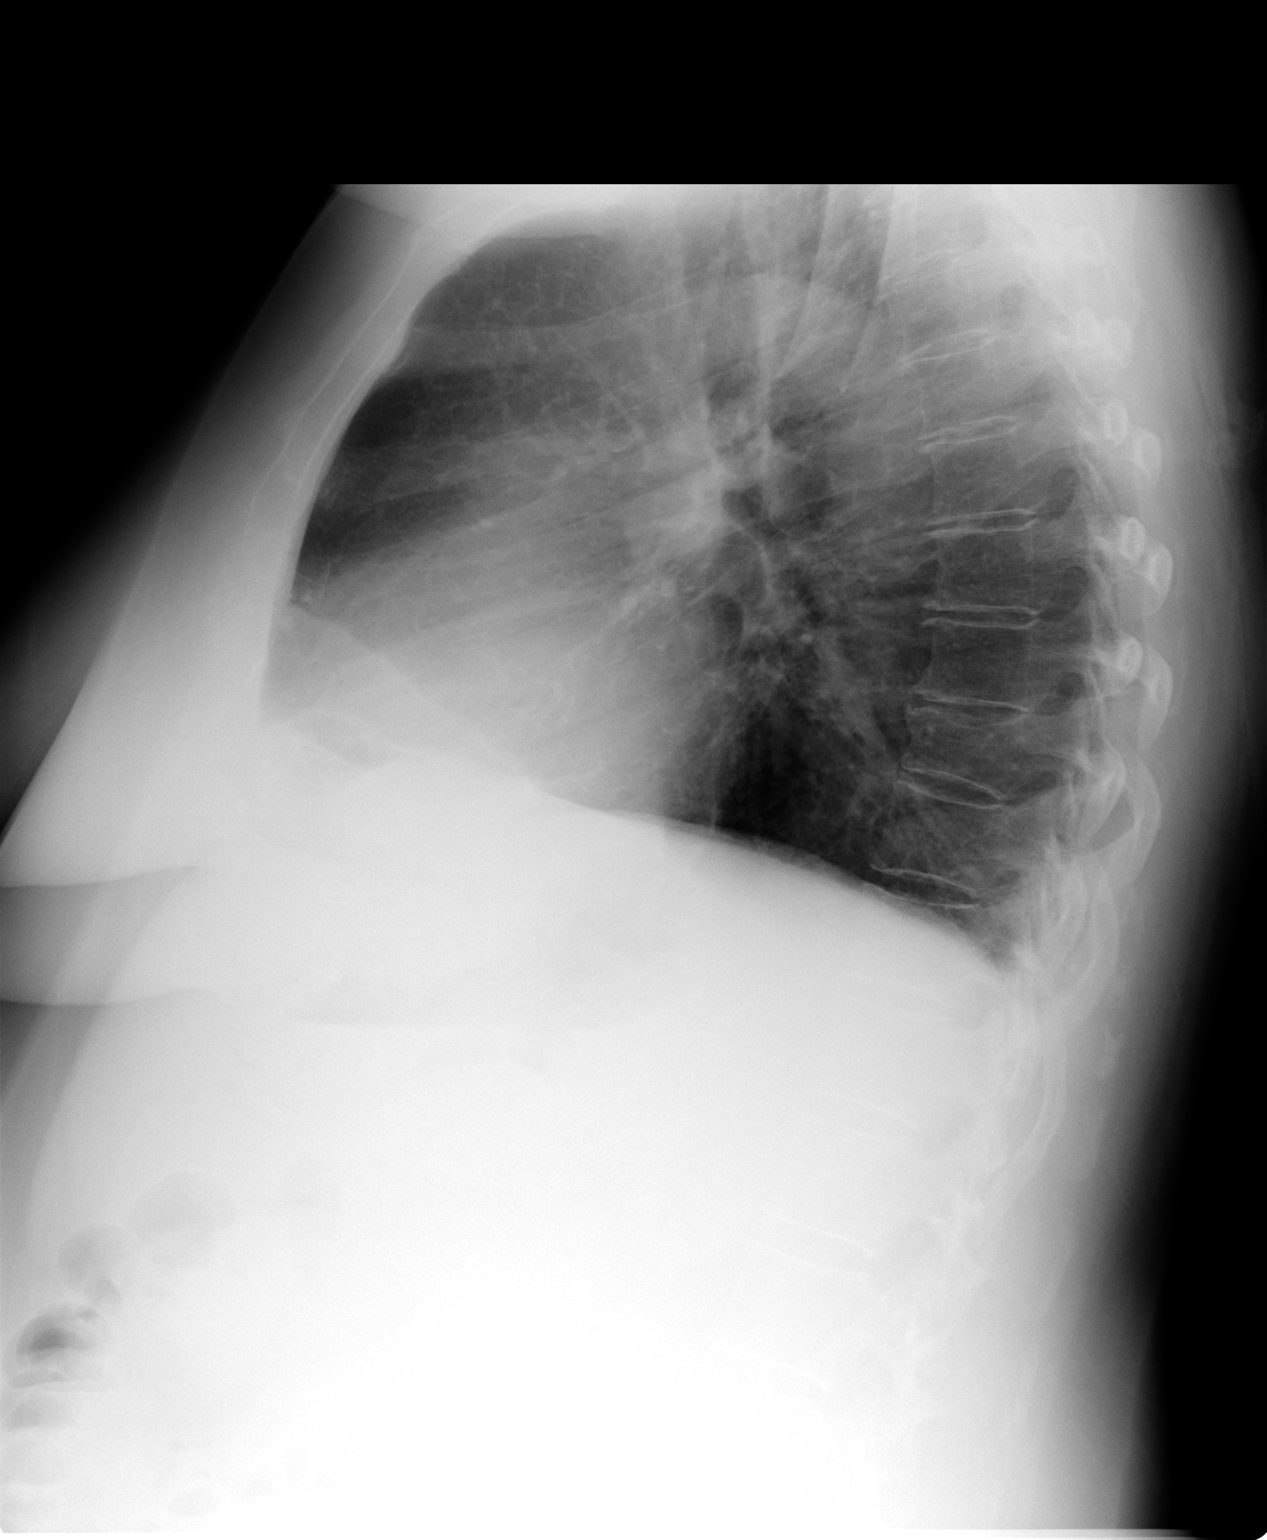

[2 of 2 positions shown; findings below may reference images not displayed]

Heart size is normal.  The mediastinum is unremarkable.  The lungs are clear.  No effusions.  Ordinary degenerative changes effect the thoracic spine. 
 IMPRESSION
 No active disease.

## 2004-11-03 IMAGING — CR DG CHEST 1V PORT
1 series · 1 of 1 positions shown · non-contrast
Comparison: none

CLINICAL DATA: Dyspnea.  
 PORTABLE CHEST ? 10/01/2004
 There is a slightly lesser degree of inspiration.  The cardiomediastinal contours appear stable.  The lungs are clear.  There is no pleural effusion or pneumothorax. 
 IMPRESSION
 No active cardiopulmonary process demonstrated.

[view not recorded]
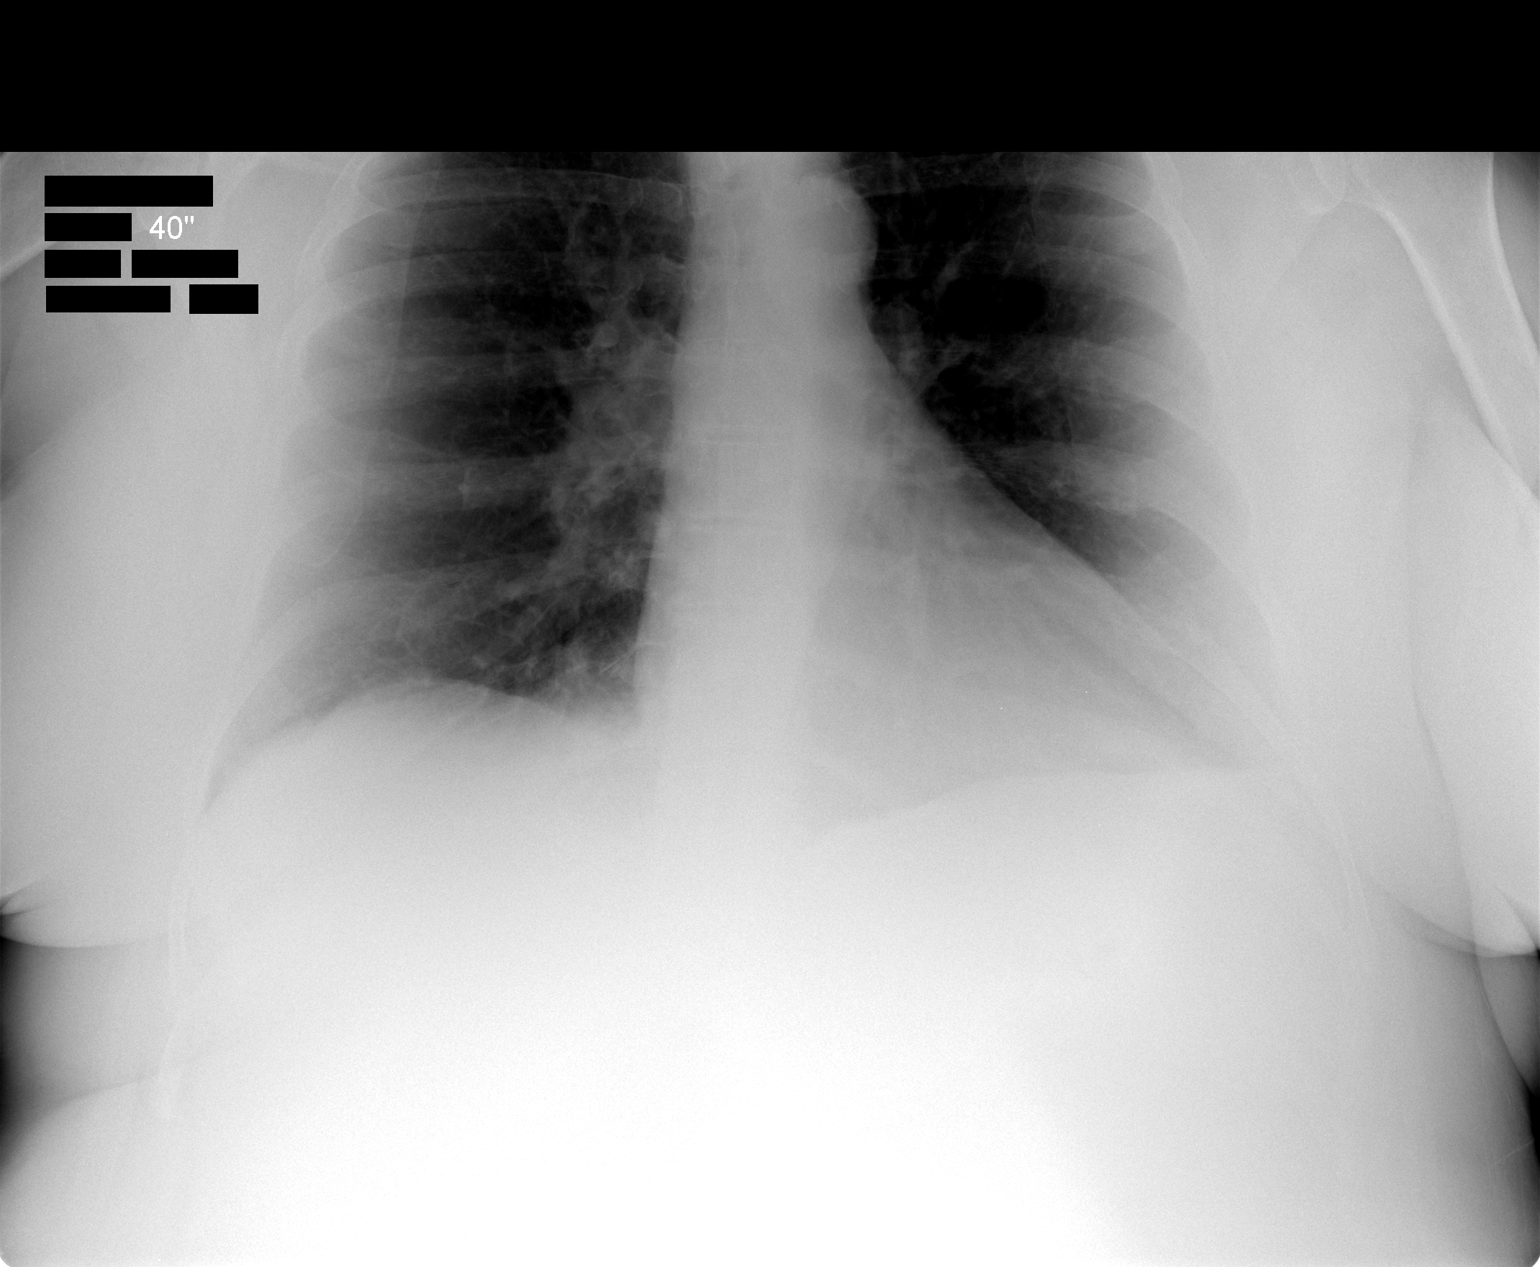

[1 of 1 positions shown; findings below may reference images not displayed]

## 2004-11-05 ENCOUNTER — Emergency Department (HOSPITAL_COMMUNITY): Admission: EM | Admit: 2004-11-05 | Discharge: 2004-11-06 | Payer: Self-pay | Admitting: Emergency Medicine

## 2004-11-06 ENCOUNTER — Inpatient Hospital Stay (HOSPITAL_COMMUNITY): Admission: EM | Admit: 2004-11-06 | Discharge: 2004-11-15 | Payer: Self-pay | Admitting: Family Medicine

## 2004-11-06 ENCOUNTER — Ambulatory Visit: Payer: Self-pay | Admitting: Family Medicine

## 2004-11-18 ENCOUNTER — Ambulatory Visit: Payer: Self-pay | Admitting: Family Medicine

## 2004-11-22 ENCOUNTER — Encounter (INDEPENDENT_AMBULATORY_CARE_PROVIDER_SITE_OTHER): Payer: Self-pay | Admitting: *Deleted

## 2004-11-25 ENCOUNTER — Ambulatory Visit: Payer: Self-pay | Admitting: Family Medicine

## 2004-12-02 ENCOUNTER — Ambulatory Visit: Payer: Self-pay | Admitting: Family Medicine

## 2004-12-05 ENCOUNTER — Emergency Department (HOSPITAL_COMMUNITY): Admission: EM | Admit: 2004-12-05 | Discharge: 2004-12-05 | Payer: Self-pay | Admitting: Emergency Medicine

## 2004-12-08 IMAGING — CR DG CHEST 1V PORT
1 series · 1 of 1 positions shown · non-contrast
Comparison: 10/01/04.

CLINICAL DATA: shortness of breath, respiratory distress
 PORTABLE CHEST (6602 hours):

[view not recorded]
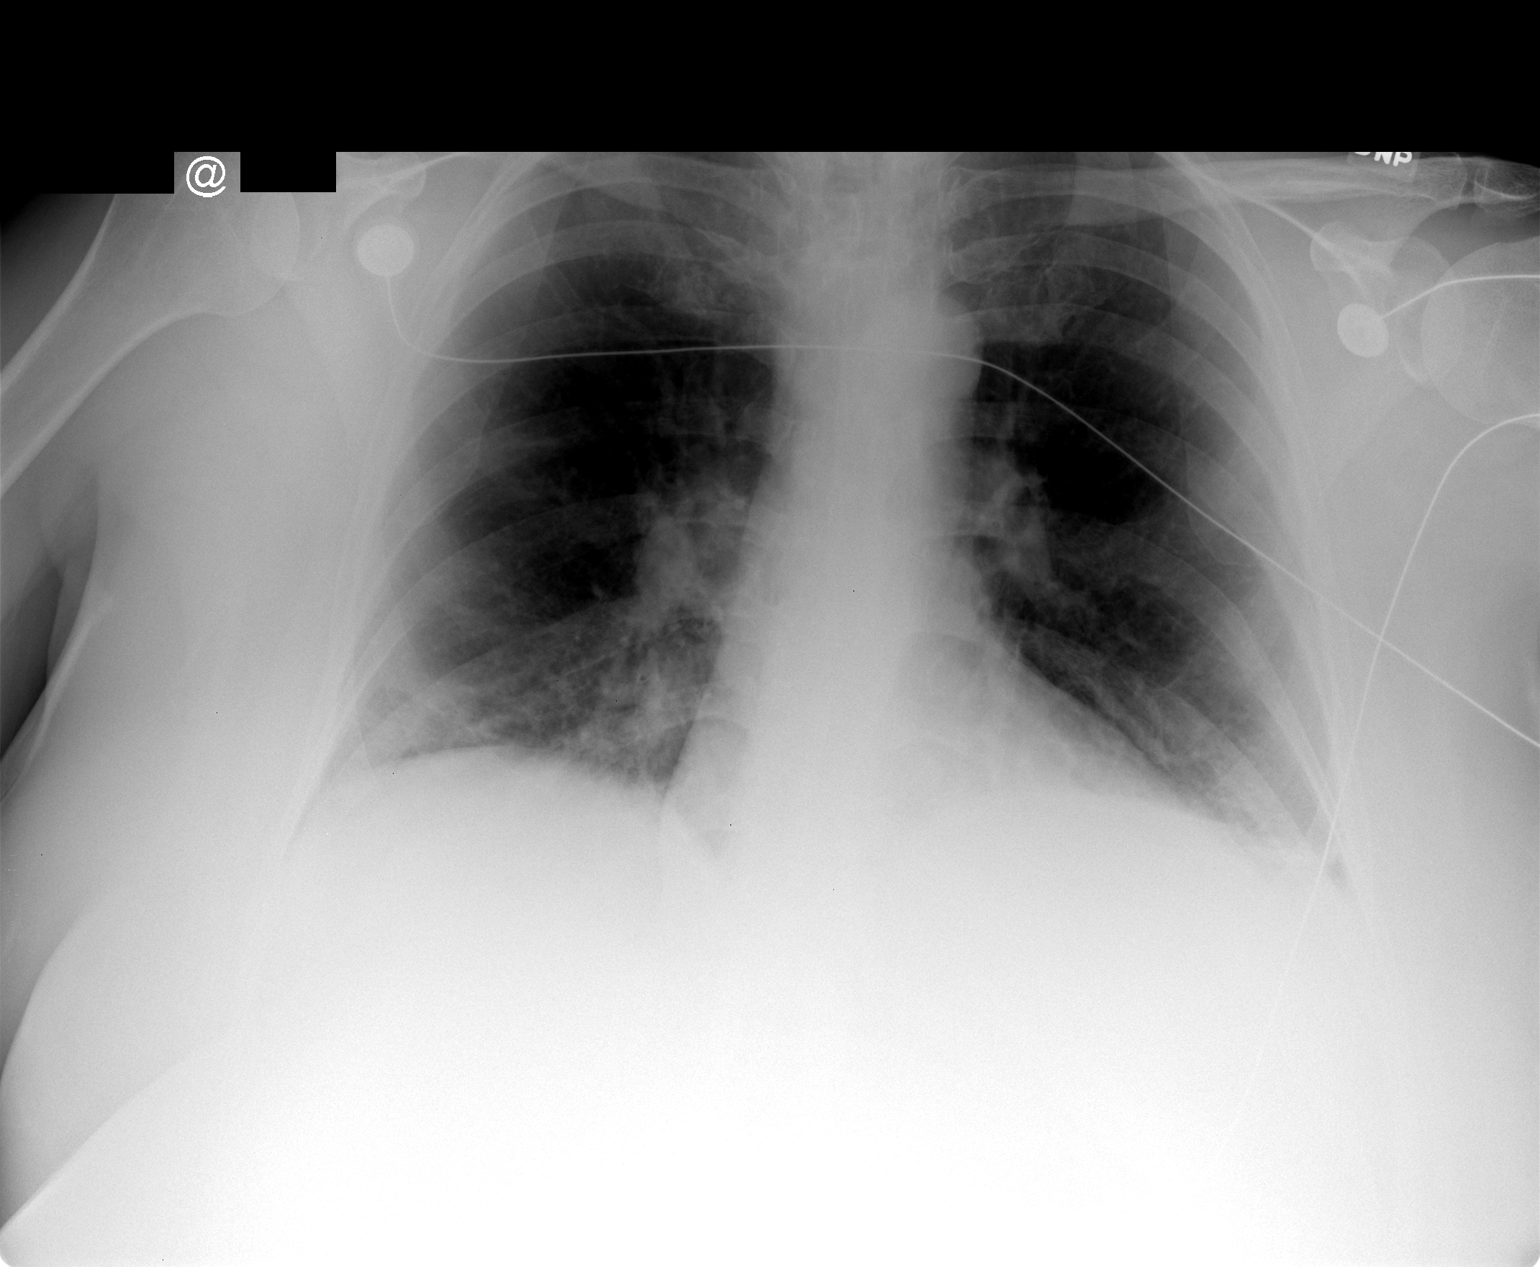

[1 of 1 positions shown; findings below may reference images not displayed]

There are changes of COPD.  There are mild bibasilar atelectatic/infiltrative changes.  Heart is borderline in size.  There are no mediastinal abnormalities.
 IMPRESSION
 Changes of COPD.  Mild bibasilar atelectatic/infiltrative changes.

## 2004-12-08 IMAGING — CT CT ANGIO CHEST
1 of 3 series · 19 of 32 positions shown · IV contrast (agent unspecified)
Comparison: none

CLINICAL DATA: 63-year-old with chest pain; shortness of breath
CHEST CT ANGIO WITH CONTRAST:

[Series 4: chest/pe 1.0 b10f · axial · 0.74mm/px · z∈[-196,+14]mm · 19 of 464 slices shown]
[im 22/464  lung]
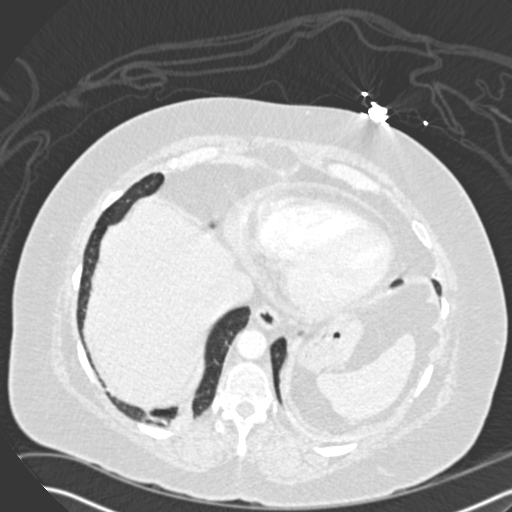
[im 43/464  mediastinal]
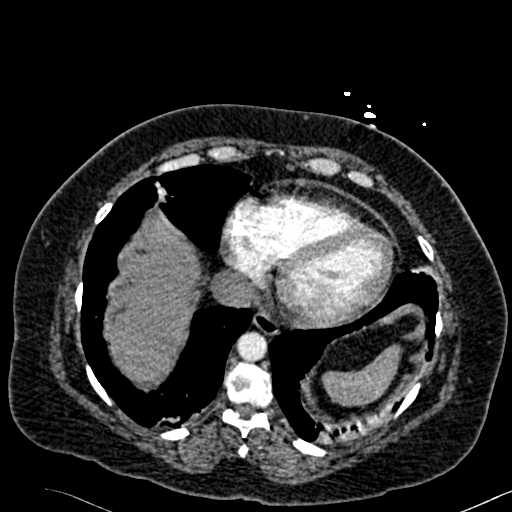
[im 85/464  lung]
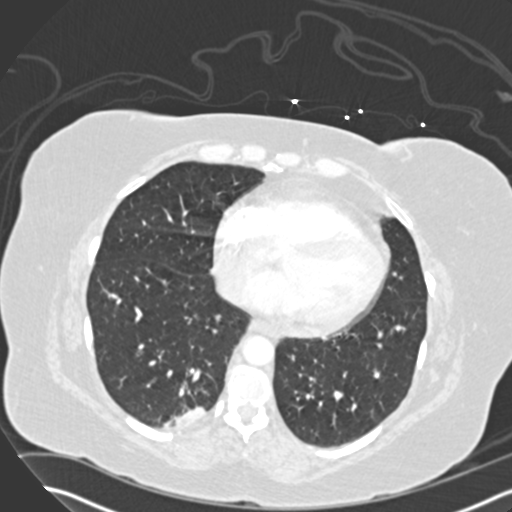
[im 106/464  mediastinal]
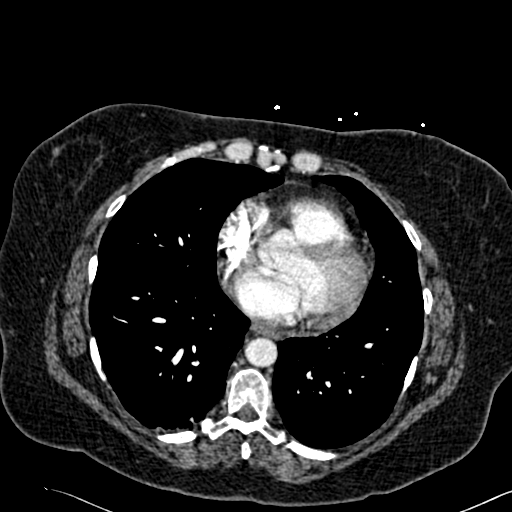
[im 109/464  lung]
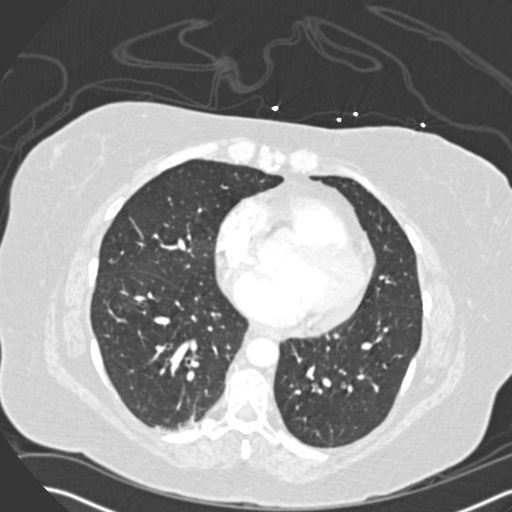
[im 127/464  mediastinal]
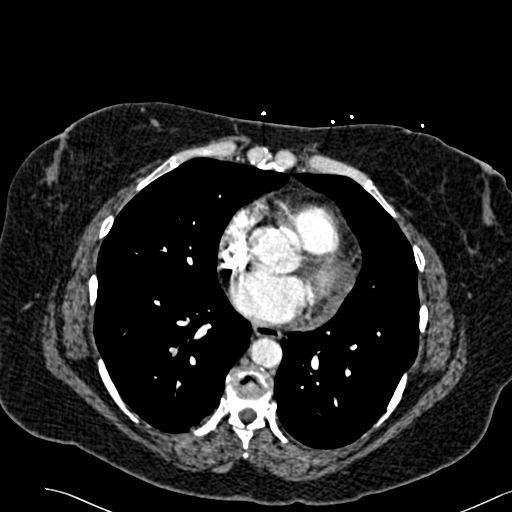
[im 155/464  lung]
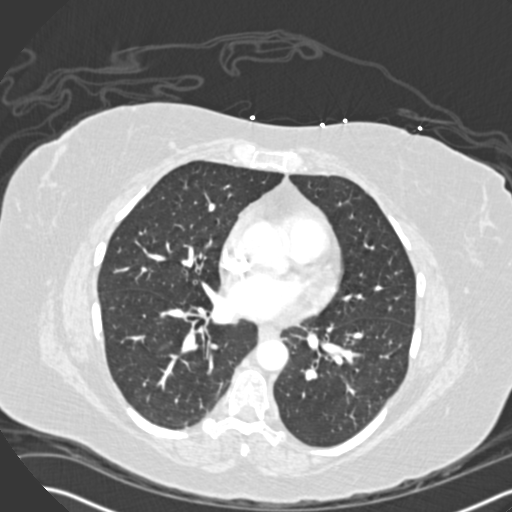
[im 169/464  mediastinal]
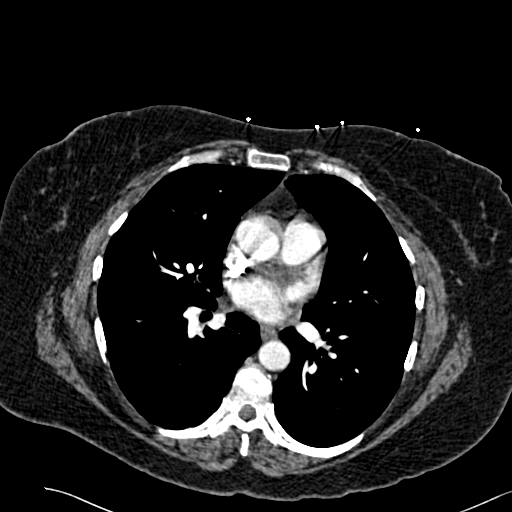
[im 190/464  lung]
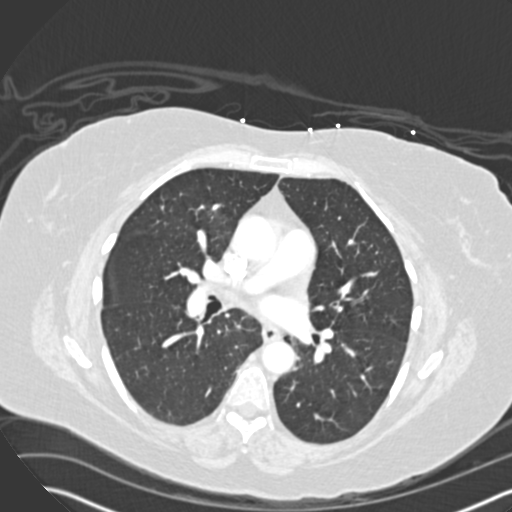
[im 232/464  mediastinal]
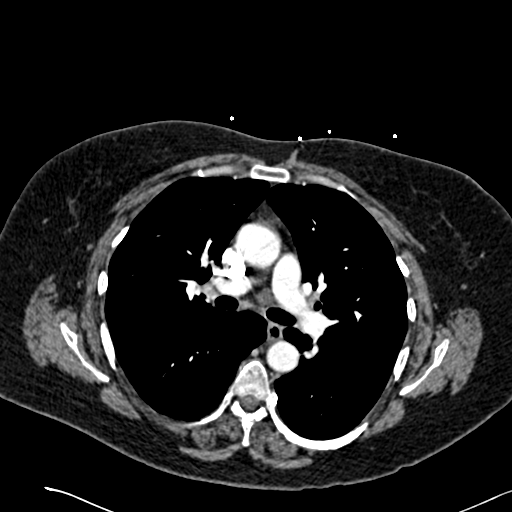
[im 253/464  lung]
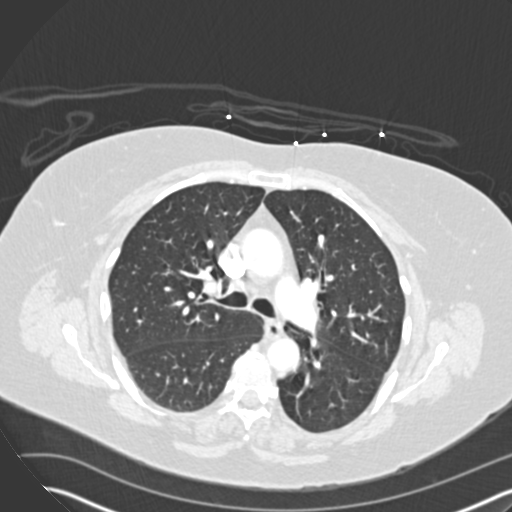
[im 274/464  mediastinal]
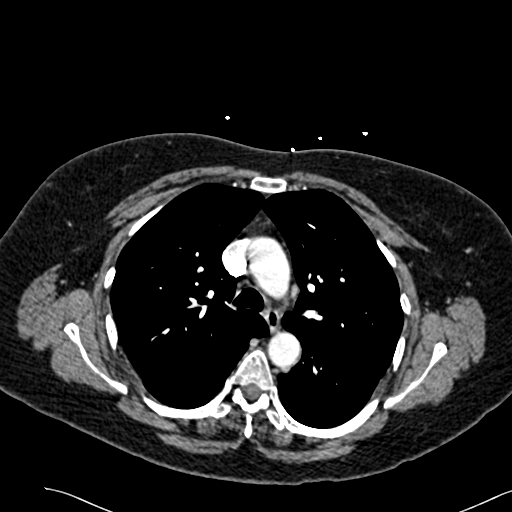
[im 295/464  lung]
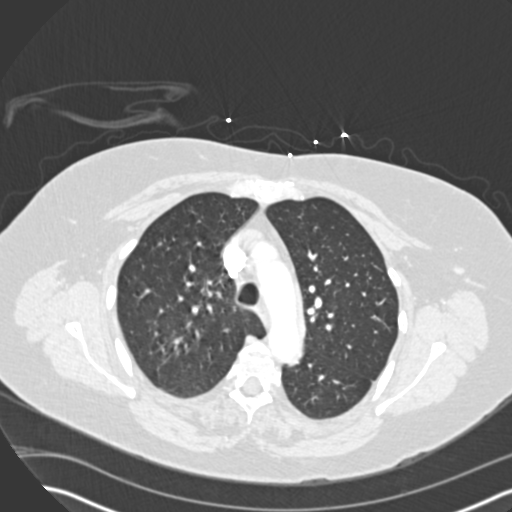
[im 316/464  mediastinal]
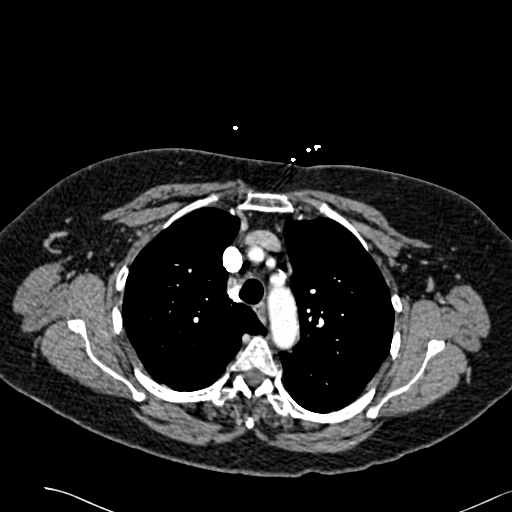
[im 337/464  lung]
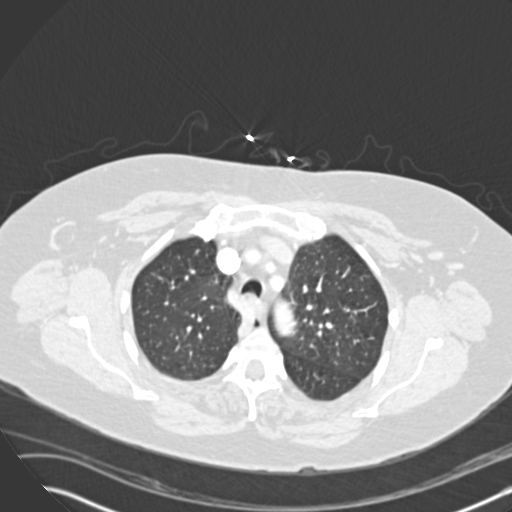
[im 358/464  mediastinal]
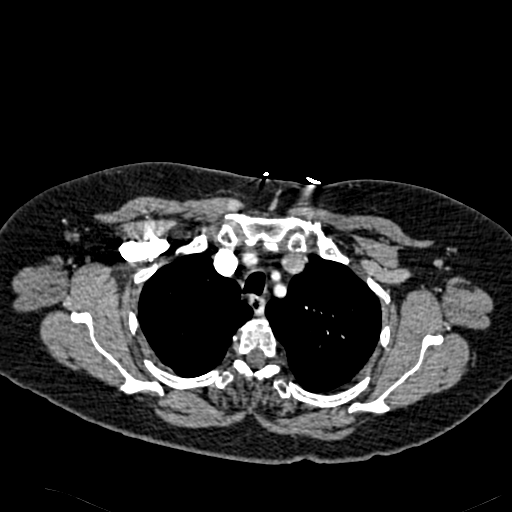
[im 379/464  lung]
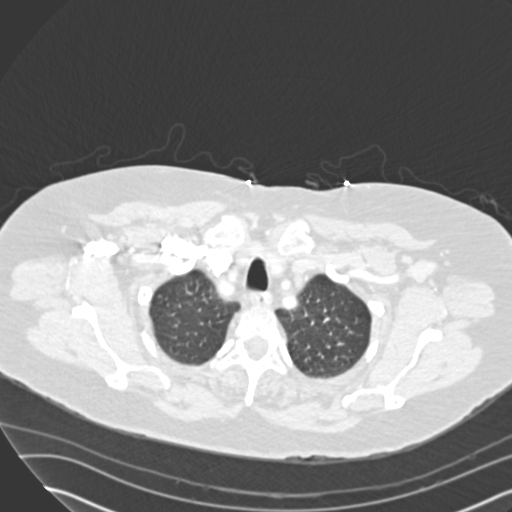
[im 421/464  mediastinal]
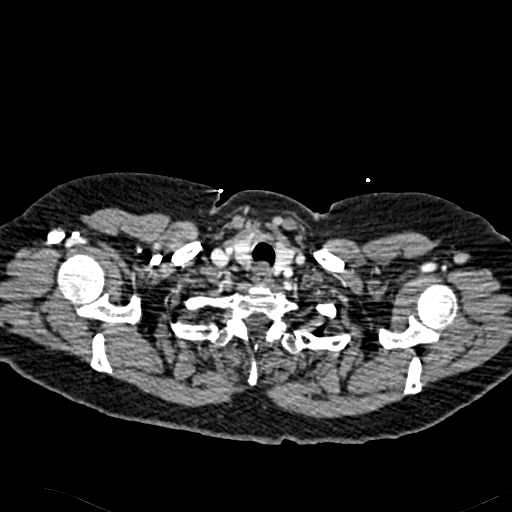
[im 442/464  lung]
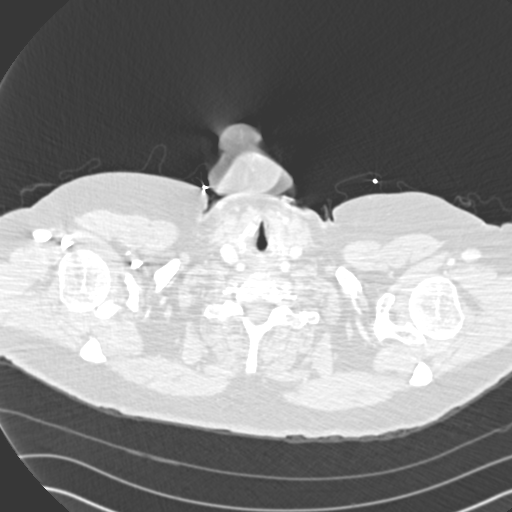

[19 of 32 positions shown; findings below may reference images not displayed]

Multidetector CT imaging of the chest was performed according to the protocol for detection of pulmonary embolism during IV bolus injection of 150 ml Omnipaque 300.  Coronal and sagittal plane reformatted images were also generated.  
The chest wall, soft tissues, and bony structures are unremarkable.  Heart size is normal.  No pericardial effusion.  No mediastinal or hilar adenopathy.  Normal descending thoracic aorta.  Pulmonary arterial tree is well opacified.  There are a few small filling defects in the right upper lobe and right lower lobe.  Pulmonary arteries consistent with small pulmonary emboli.  No large central emboli are seen.  Examination of the lung parenchyma demonstrates no acute pulmonary findings.  Minimal right lower lobe atelectasis.
The upper abdomen demonstrates no significant findings.
IMPRESSION: 1.  Small right sided pulmonary emboli. 
2.  No acute pulmonary findings. 
Mulitplanar reconstruction:
Coronal and Sagittal reconstructed images were performed and reviewed and demonstrate the above findings.

## 2004-12-12 ENCOUNTER — Emergency Department (HOSPITAL_COMMUNITY): Admission: EM | Admit: 2004-12-12 | Discharge: 2004-12-12 | Payer: Self-pay | Admitting: Emergency Medicine

## 2004-12-15 ENCOUNTER — Inpatient Hospital Stay (HOSPITAL_COMMUNITY): Admission: EM | Admit: 2004-12-15 | Discharge: 2004-12-18 | Payer: Self-pay | Admitting: Family Medicine

## 2004-12-15 ENCOUNTER — Emergency Department (HOSPITAL_COMMUNITY): Admission: EM | Admit: 2004-12-15 | Discharge: 2004-12-15 | Payer: Self-pay | Admitting: Emergency Medicine

## 2004-12-23 ENCOUNTER — Ambulatory Visit: Payer: Self-pay | Admitting: Family Medicine

## 2004-12-31 ENCOUNTER — Ambulatory Visit: Payer: Self-pay | Admitting: Family Medicine

## 2005-01-07 IMAGING — CT CT ANGIO CHEST
1 of 2 series · 19 of 33 positions shown · IV contrast (omnipaque)
Comparison: none

CLINICAL DATA: Shortness of breath.  Chest pain.  Question pulmonary embolus.
CT ANGIO CHEST WITH PULMONARY EMBOLUS PROTOCOL ? 12/05/2004:
Multi-detector helical study performed during IV contrast enhancement of 282cc of Omnipaque 300.  Comparison is made with a 01/16/2004 study.
There is no CT scan evidence for pulmonary emboli.   lymph node located posterior to the proximal left pulmonary artery (anterior to the left mainstem bronchus) is unchanged.  The small lymph node within the right hilum also is unchanged.  These would be most consistent with reactive adenopathy.  There are pleural parenchymal scarring changes seen within the right lung base.  There are also mild superimposed bibasilar linear atelectatic changes.  The central airways are patent.

[Series 4: chest/pe 1.0 b10f · axial · 0.70mm/px · z∈[-241,+71]mm · 19 of 684 slices shown]
[im 30/684  lung]
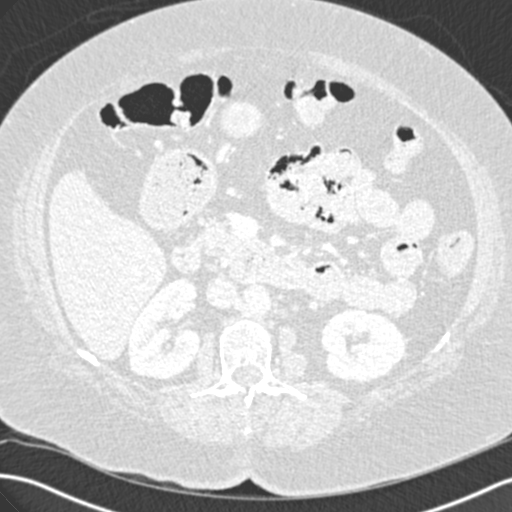
[im 90/684  mediastinal]
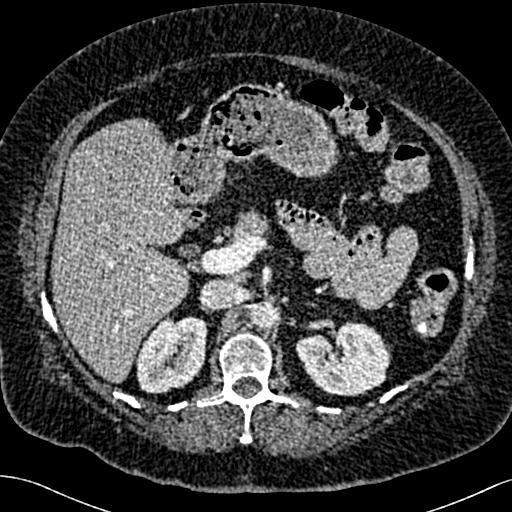
[im 119/684  lung]
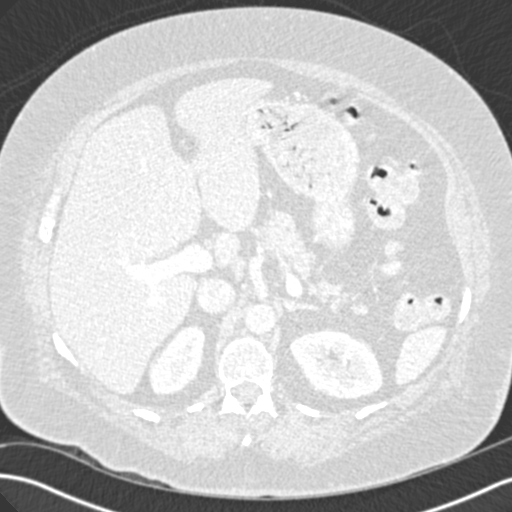
[im 149/684  mediastinal]
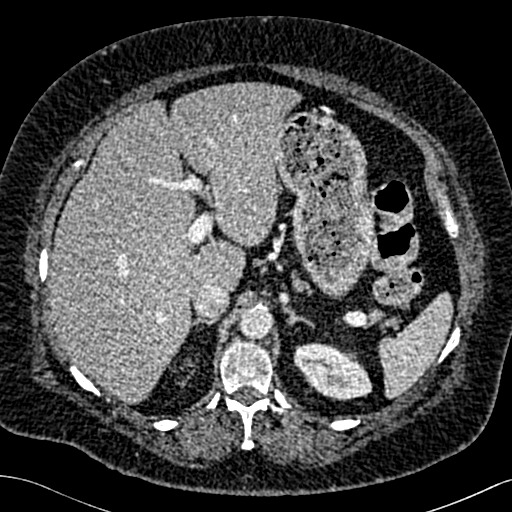
[im 179/684  lung]
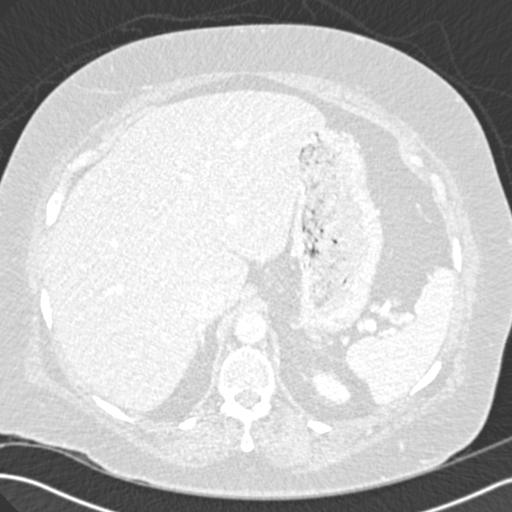
[im 208/684  mediastinal]
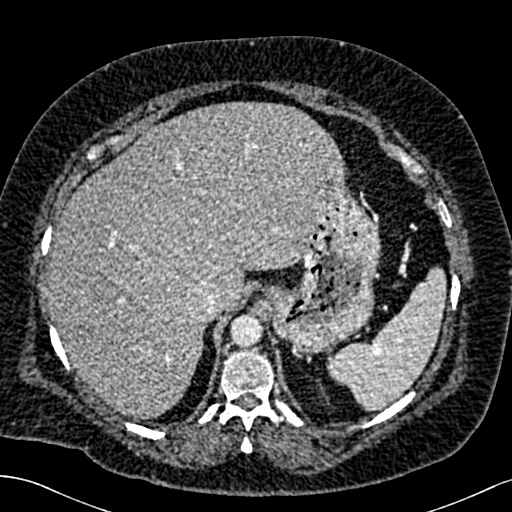
[im 238/684  lung]
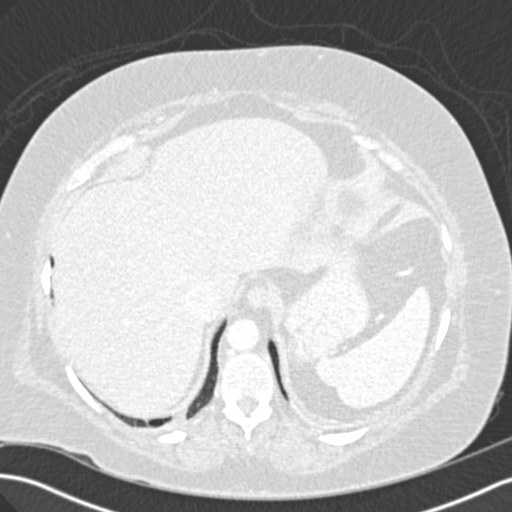
[im 297/684  mediastinal]
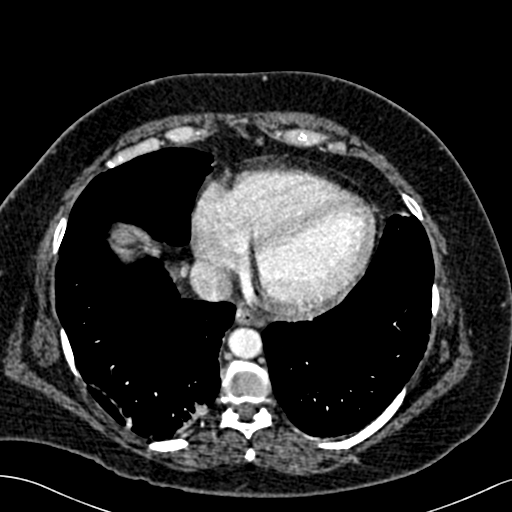
[im 323/684  lung]
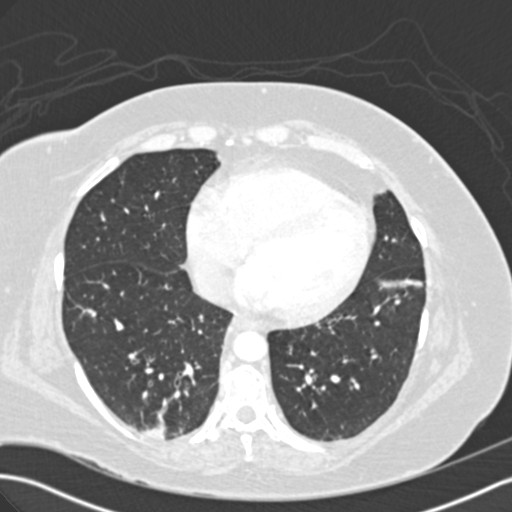
[im 342/684  mediastinal]
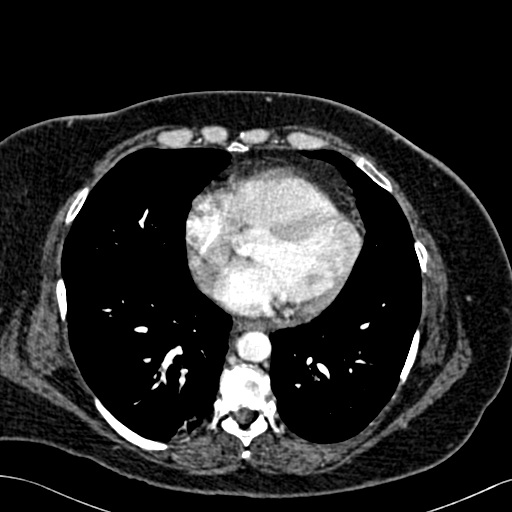
[im 357/684  lung]
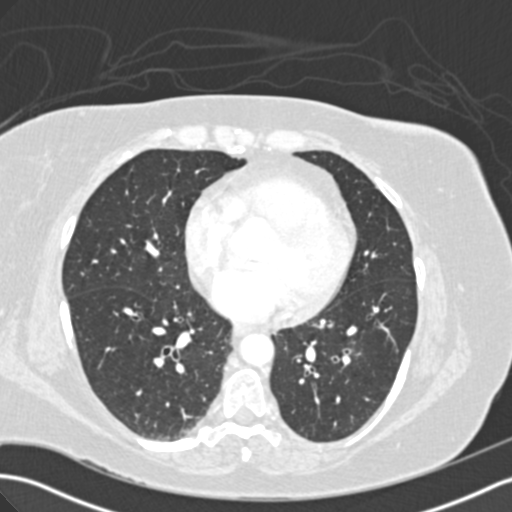
[im 387/684  mediastinal]
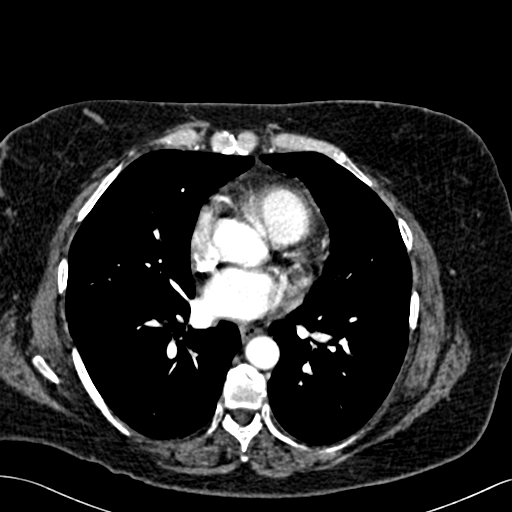
[im 446/684  lung]
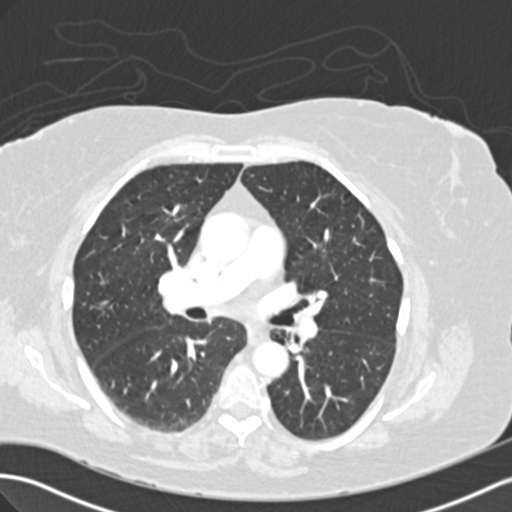
[im 476/684  mediastinal]
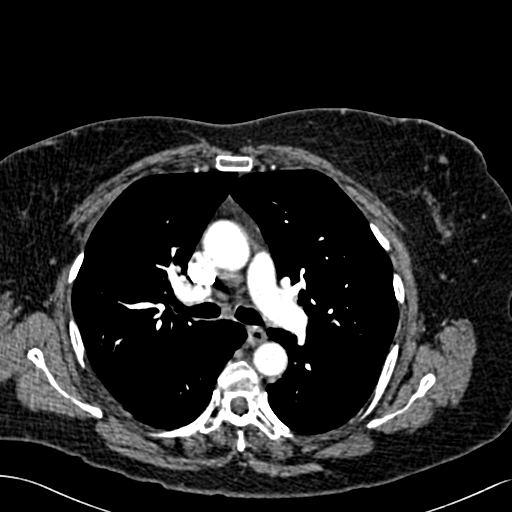
[im 505/684  lung]
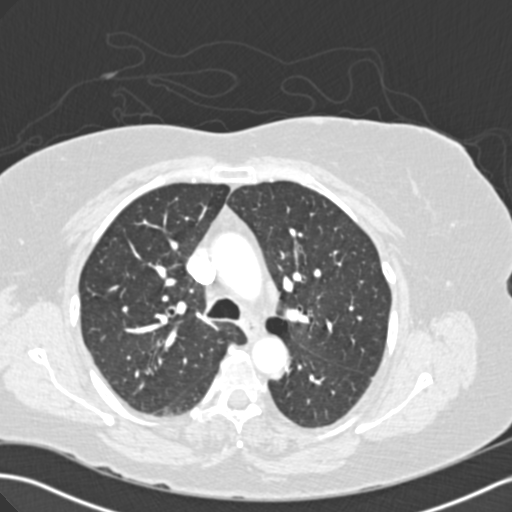
[im 535/684  mediastinal]
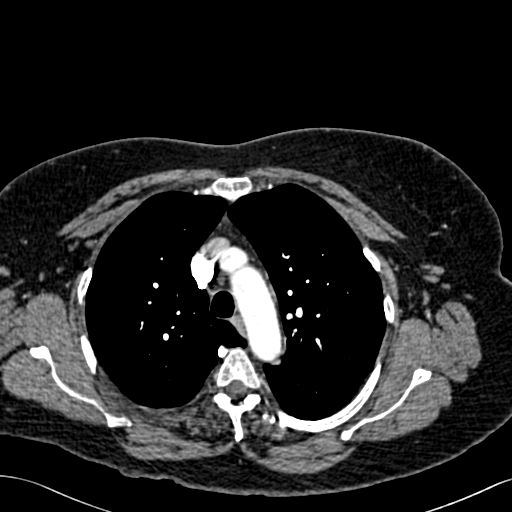
[im 565/684  lung]
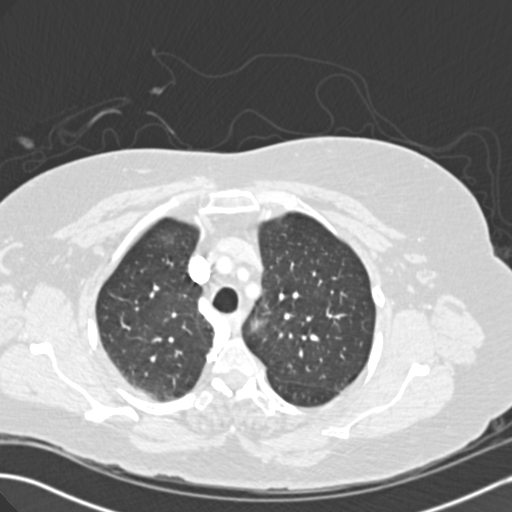
[im 594/684  mediastinal]
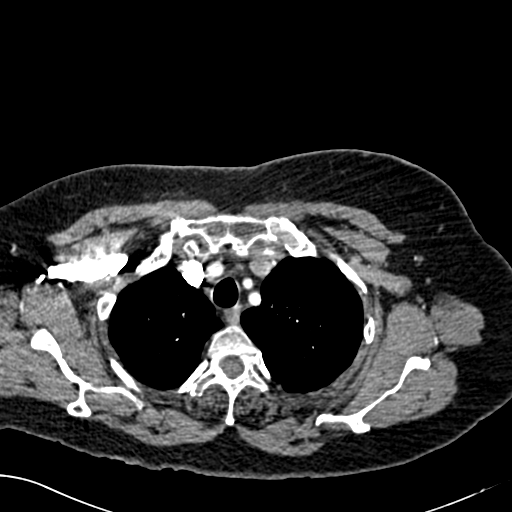
[im 654/684  lung]
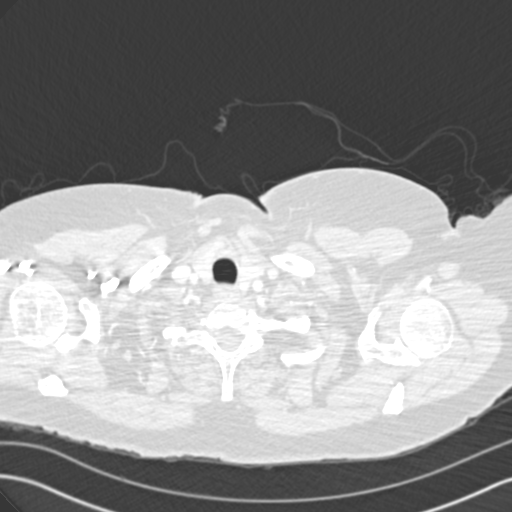

[19 of 33 positions shown; findings below may reference images not displayed]

IMPRESSION: 1. No evidence for pulmonary emboli.  Pleural parenchymal scarring changes seen within the right lung base with mild linear bibasilar atelectatic changes also noted.
2. Stable mildly enlarged lymph nodes seen adjacent to left pulmonary artery and associated with the right hilum, most likely representing reactive lymph nodes.

## 2005-01-07 IMAGING — CR DG CHEST 1V PORT
1 series · 1 of 1 positions shown · non-contrast
Comparison: 11/05/04 chest CT scan and previous chest x-ray dated 11/05/04.

CLINICAL DATA: Cough, shortness of breath.  
 CHEST PORTABLE ONE VIEW - 2191 HOURS:

[view not recorded]
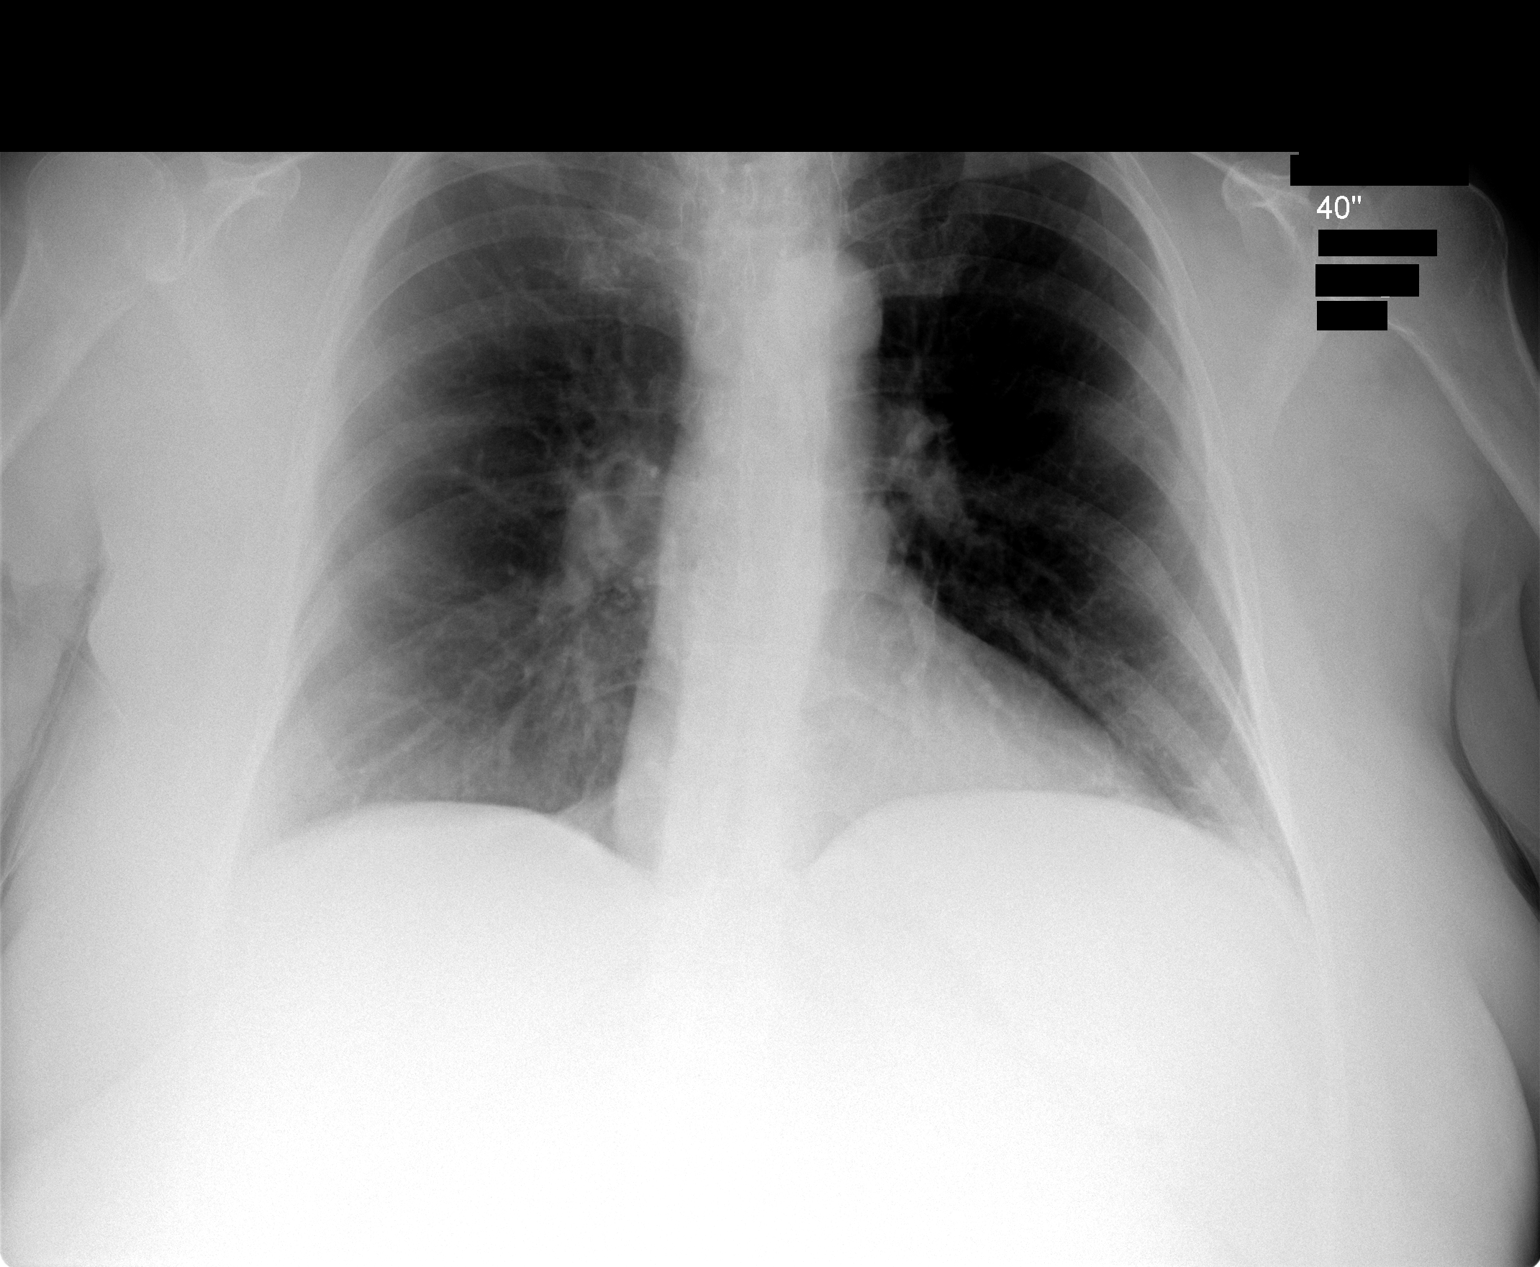

[1 of 1 positions shown; findings below may reference images not displayed]

There are changes of COPD.  There are no infiltrative or edematous changes.  The heart is borderline in size.
IMPRESSION: Changes of COPD.  No acute abnormality by plain film.

## 2005-01-14 ENCOUNTER — Ambulatory Visit: Payer: Self-pay | Admitting: Family Medicine

## 2005-01-14 IMAGING — CR DG CHEST 2V
2 series · 2 of 2 positions shown · non-contrast
Comparison: 12/05/04.

CLINICAL DATA: Back pain/short of breath. 
 CHEST - TWO VIEW:

[view not recorded (1 of 2)]
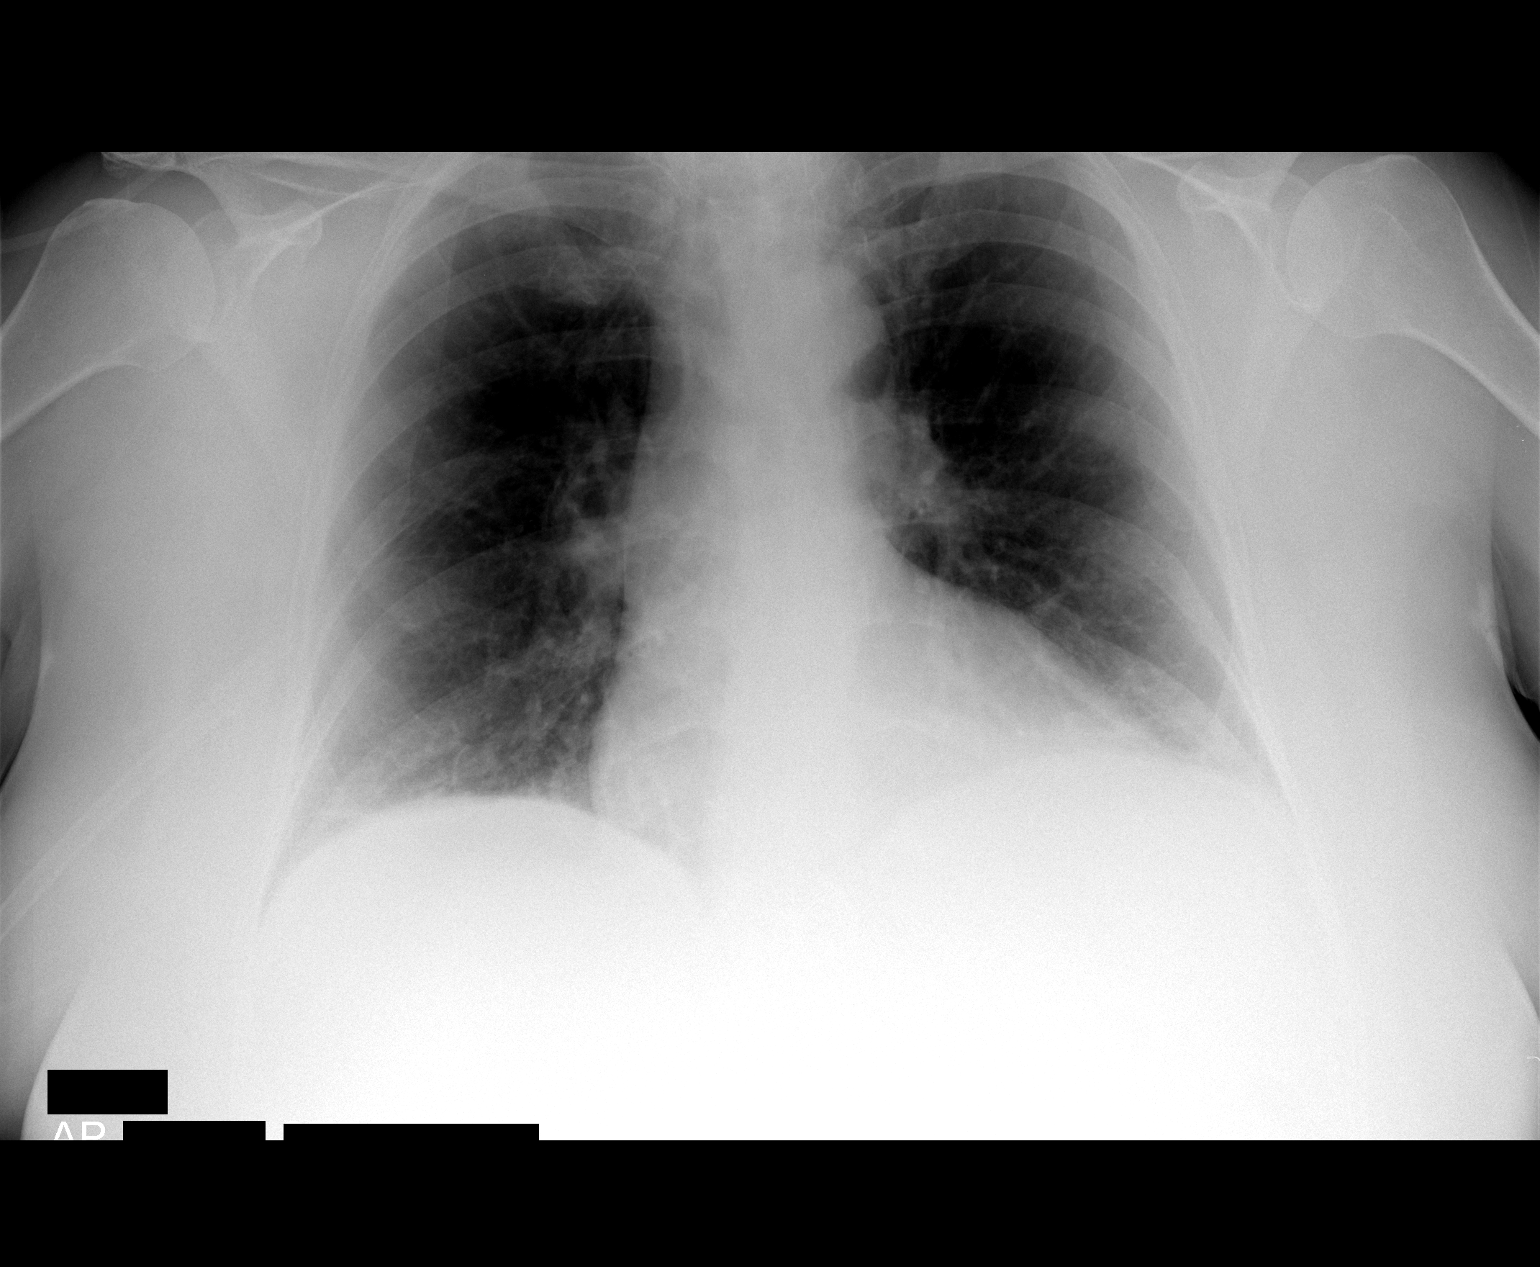

[view not recorded (2 of 2)]
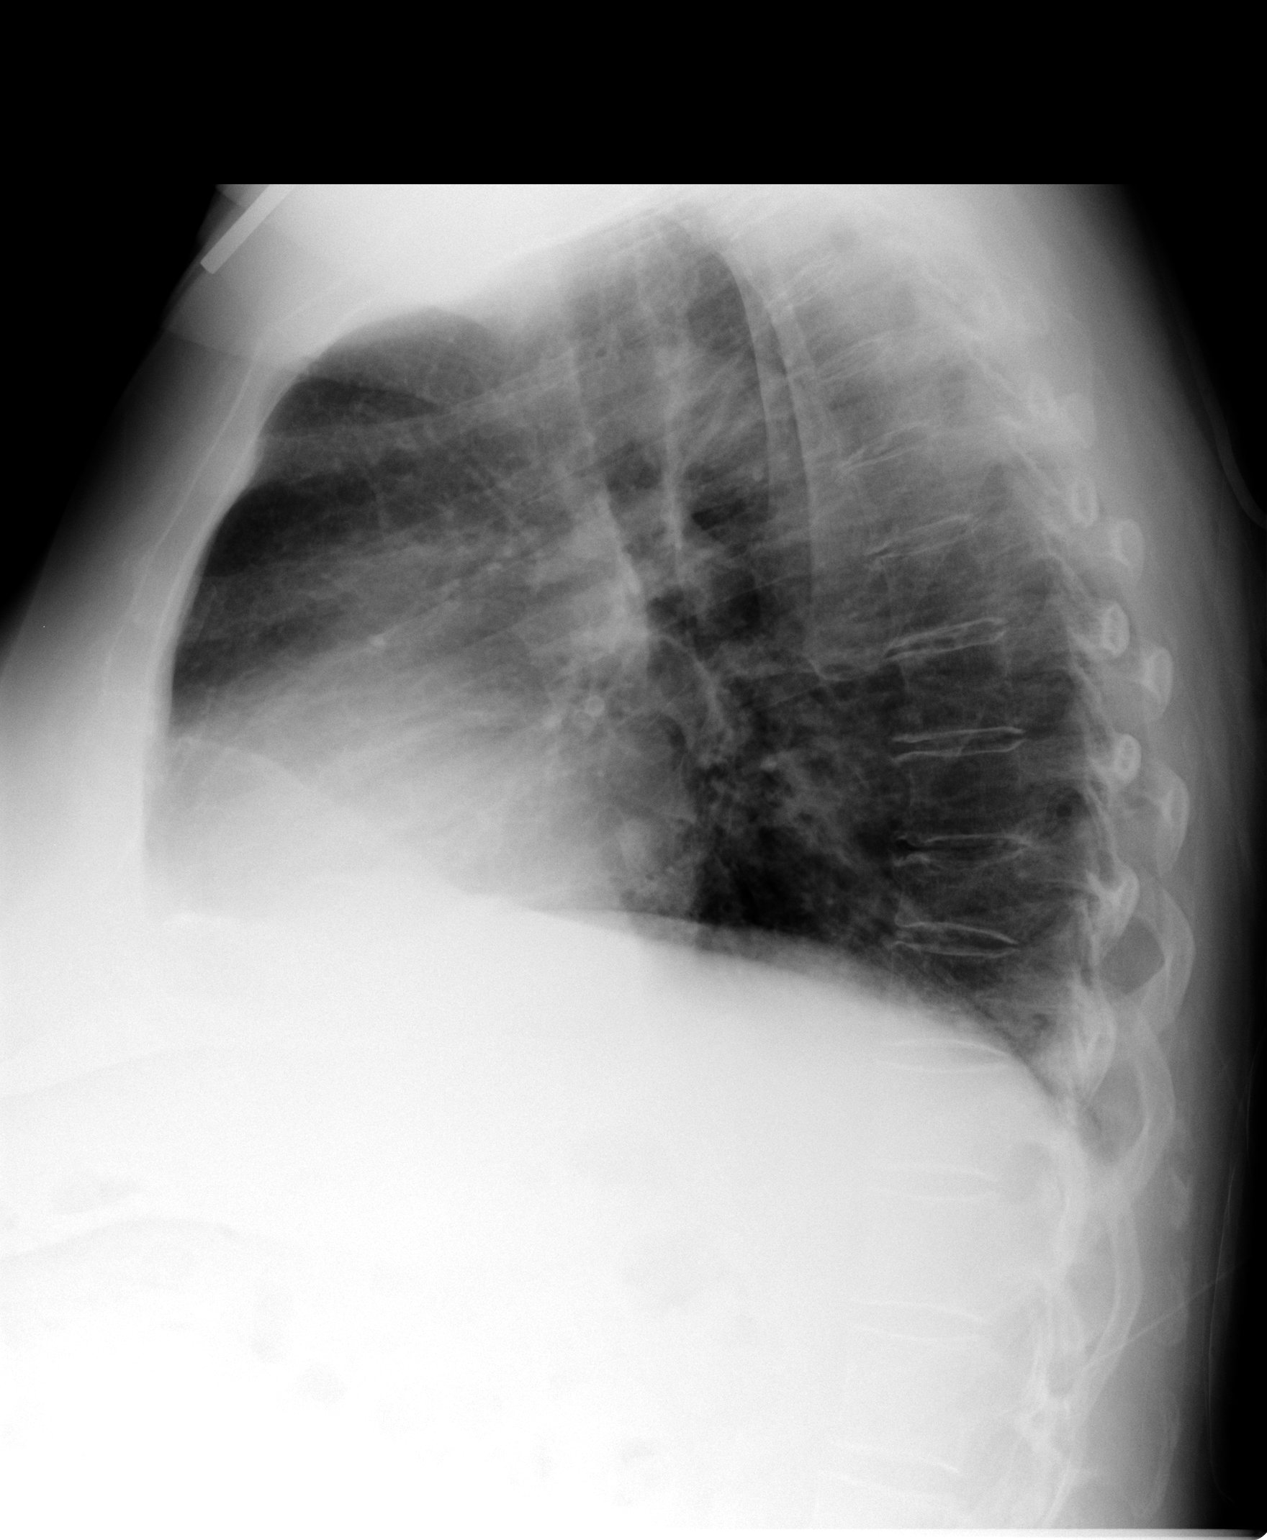

[2 of 2 positions shown; findings below may reference images not displayed]

FINDINGS: AP upright and lateral views were obtained with the patient sitting on a stretcher.  
 Heart within normal limits.  Moderate COPD.  No definite pneumonia.  Aorta tortuous but unchanged. 
 There is minimal haziness at the bases which was not present previously.  I doubt that this is true air space disease since no abnormality is seen on the lateral view.
IMPRESSION: Chronic lung changes with no definite acute process.

## 2005-01-17 IMAGING — CT CT HEAD W/O CM
1 of 3 series · 12 of 30 positions shown, 15 images · non-contrast
Comparison: 01/21/03.

CLINICAL DATA: Fall.  Back pain.  Slurred speech.  Change in mental status.

[Series 2: head routi 5.0 h30s · axial · 0.43mm/px · z∈[-644,-534]mm · 12 of 28 slices shown, 15 images]
[im 3/28  brain]
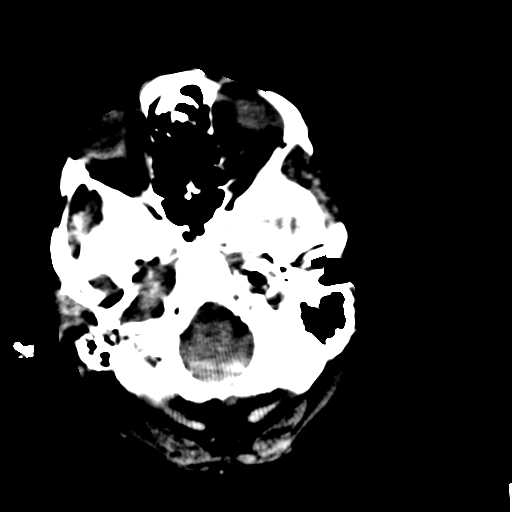
[im 3/28  bone]
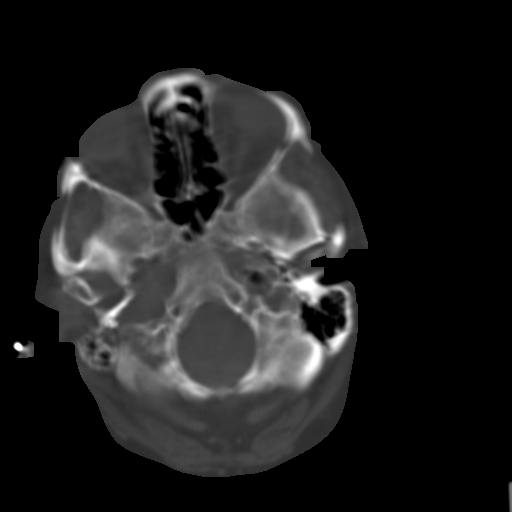
[im 5/28  brain]
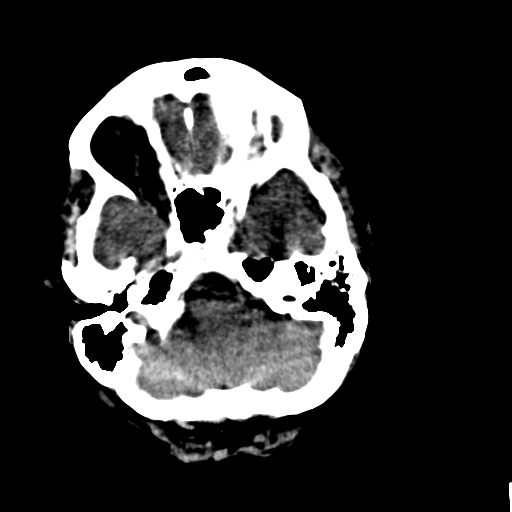
[im 7/28  brain]
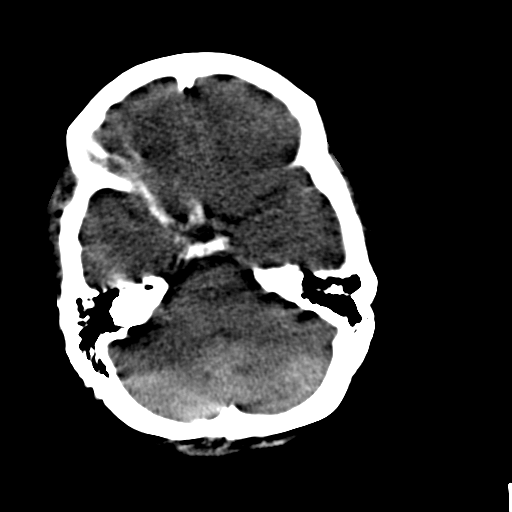
[im 9/28  brain]
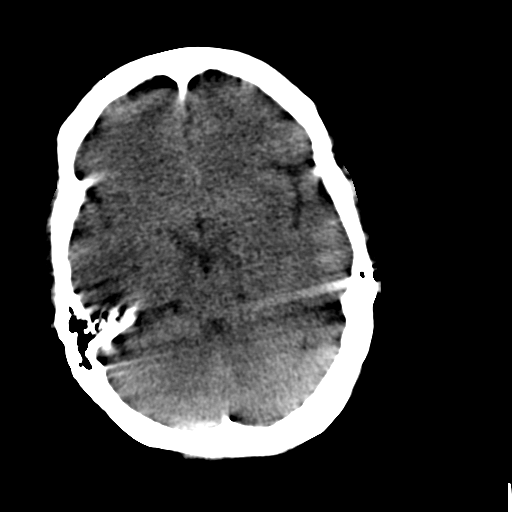
[im 11/28  brain]
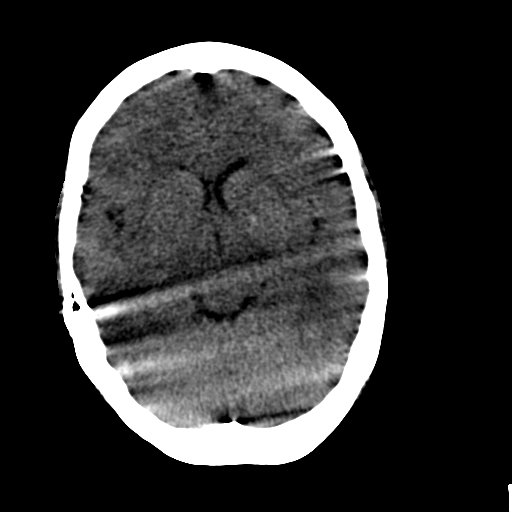
[im 11/28  bone]
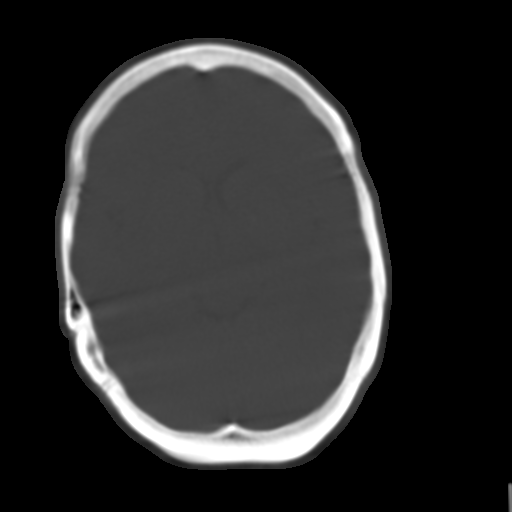
[im 13/28  brain]
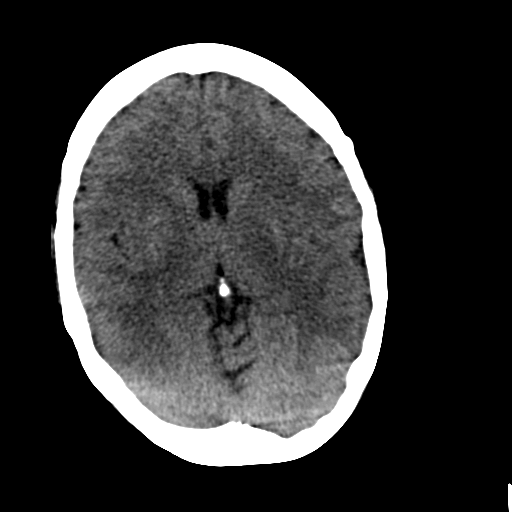
[im 15/28  brain]
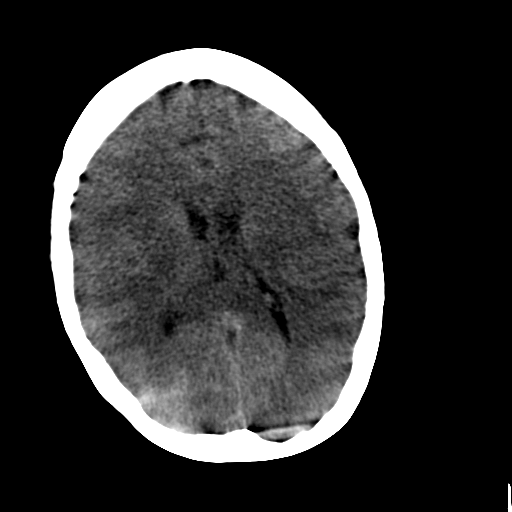
[im 17/28  brain]
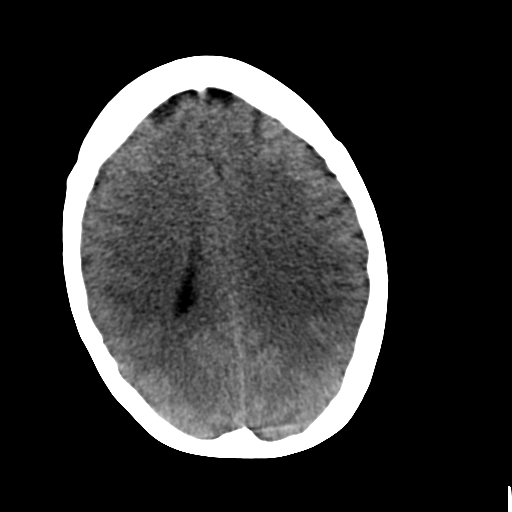
[im 19/28  brain]
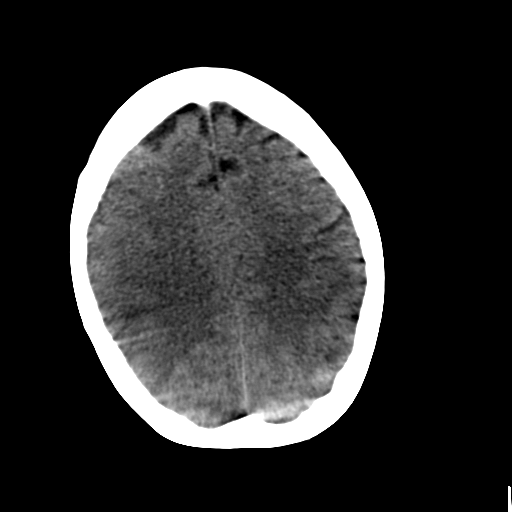
[im 19/28  bone]
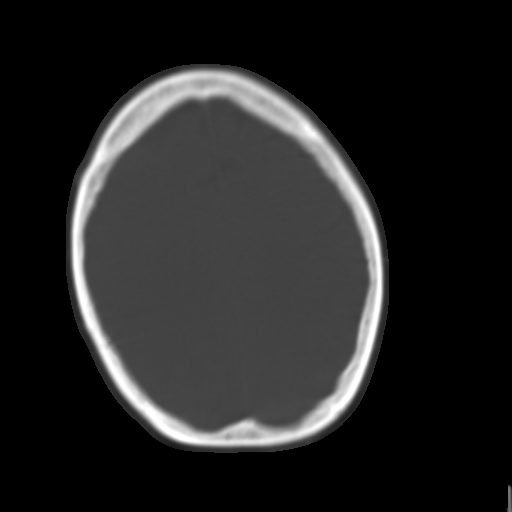
[im 21/28  brain]
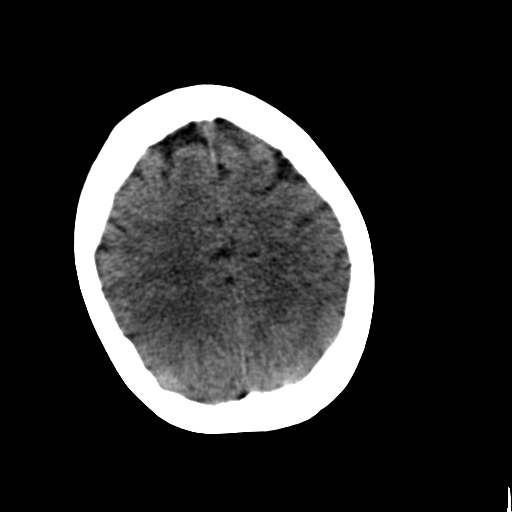
[im 23/28  brain]
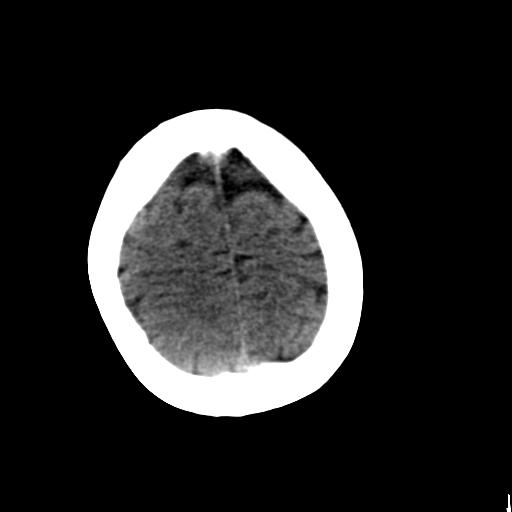
[im 25/28  brain]
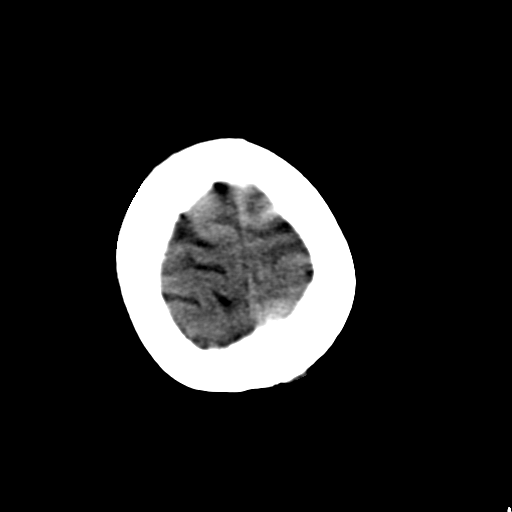

[12 of 30 positions shown; findings below may reference images not displayed]

CT HEAD WITHOUT CONTRAST:
Contiguous 5 mm axial images were obtained through the head without contrast.  Study is degraded by patient motion despite repeat scanning.  
There is no gross hemorrhage, hydrocephalus, midline shift, or abnormal extra-axial fluid collection.  Small areas of acute subarachnoid hemorrhage could be obscured by the motion artifact.  While no definite acute ischemic change is identified, patient motion further limits sensitivity.  
Visualized paranasal sinuses are grossly clear.
IMPRESSION: 1.  No acute hemorrhage, midline shift or abnormal extra-axial fluid collection.  No evidence for hydrocephalus.
2.  Study degraded by patient motion and small amount of subarachnoid hemorrhage could be obscured.  Similarly, areas of acute ischemia may be inapparent.

## 2005-01-17 IMAGING — CR DG CHEST 1V PORT
1 series · 1 of 1 positions shown · non-contrast
Comparison: 12/12/2004.

CLINICAL DATA: Altered level of consciousness.

PORTABLE CHEST - 1 VIEW

[view not recorded]
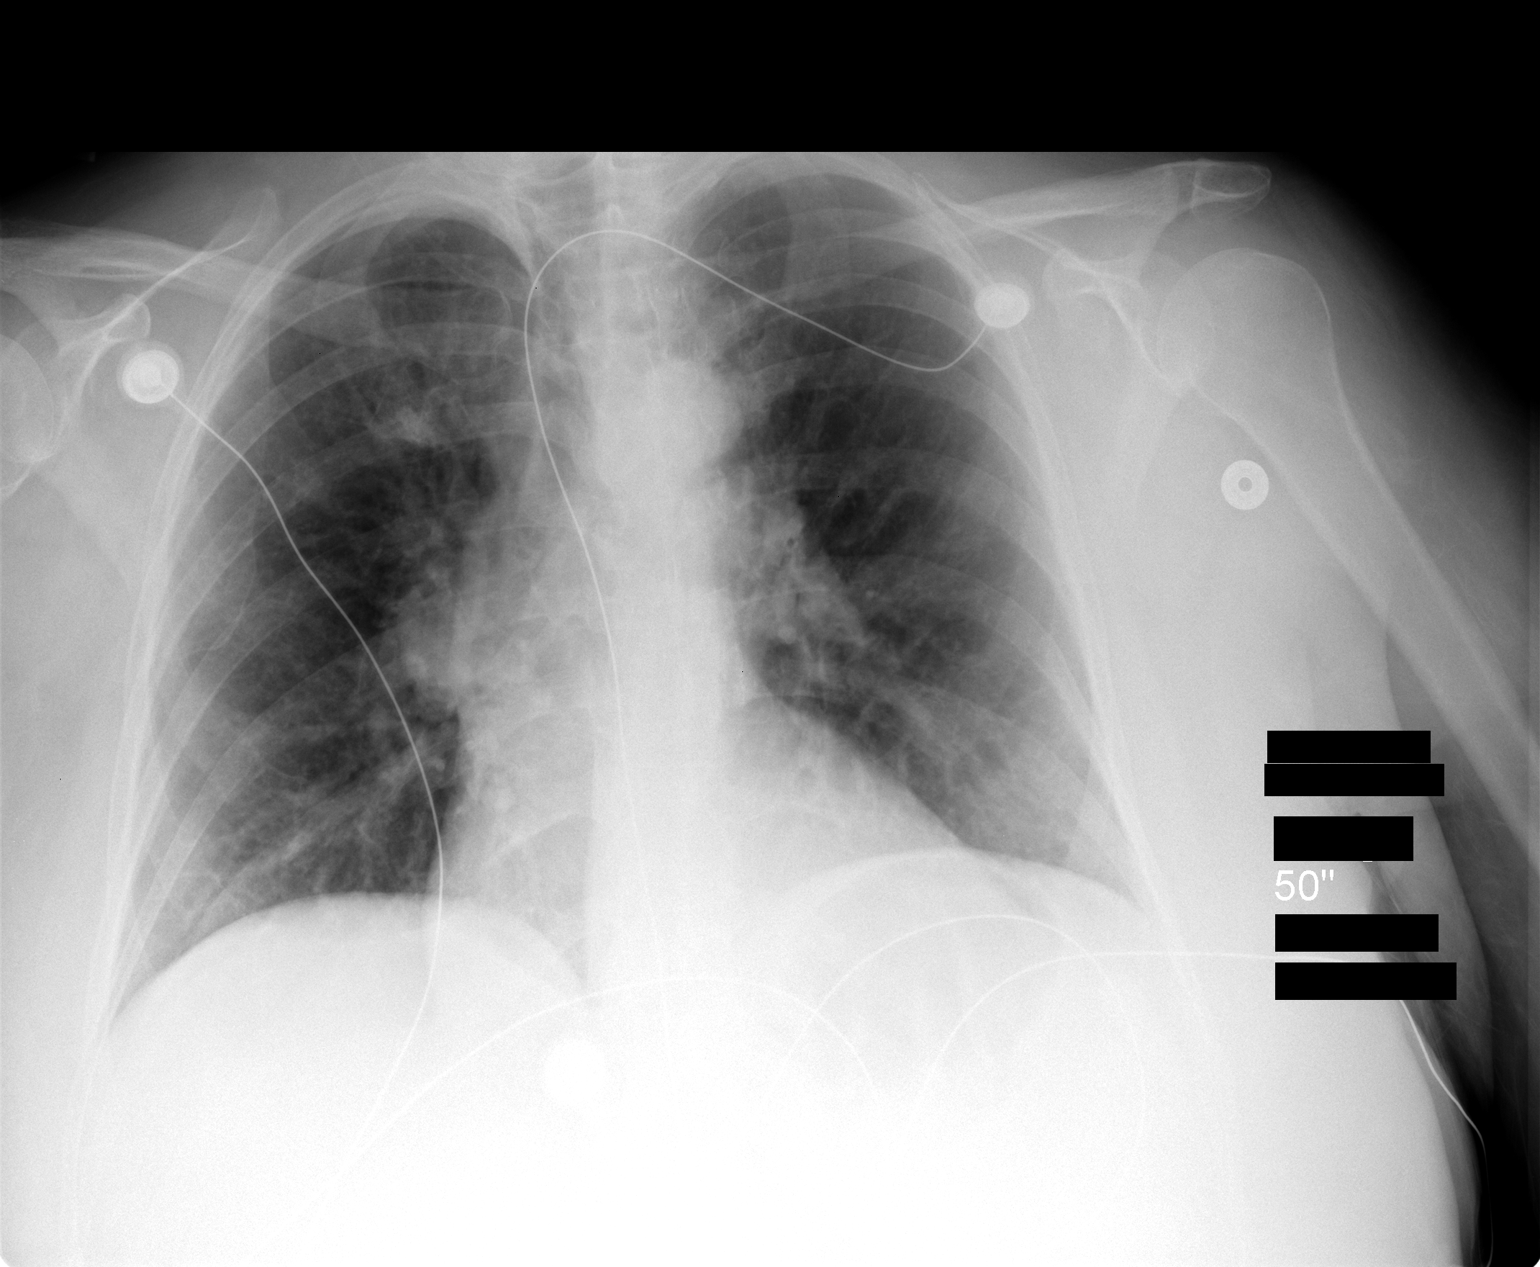

[1 of 1 positions shown; findings below may reference images not displayed]

FINDINGS: Stable grossly normal sized heart and mild diffuse peribronchial
thickening and accentuation of interstitial markings. Diffuse osteopenia.  

IMPRESSION

Stable mild chronic bronchitic changes and mild chronic interstitial lung
disease. No acute abnormality.

## 2005-01-27 ENCOUNTER — Ambulatory Visit: Payer: Self-pay | Admitting: Family Medicine

## 2005-02-10 ENCOUNTER — Ambulatory Visit: Payer: Self-pay | Admitting: Sports Medicine

## 2005-02-10 ENCOUNTER — Emergency Department (HOSPITAL_COMMUNITY): Admission: EM | Admit: 2005-02-10 | Discharge: 2005-02-10 | Payer: Self-pay | Admitting: Emergency Medicine

## 2005-02-13 ENCOUNTER — Emergency Department (HOSPITAL_COMMUNITY): Admission: EM | Admit: 2005-02-13 | Discharge: 2005-02-13 | Payer: Self-pay | Admitting: Emergency Medicine

## 2005-02-17 ENCOUNTER — Emergency Department (HOSPITAL_COMMUNITY): Admission: EM | Admit: 2005-02-17 | Discharge: 2005-02-17 | Payer: Self-pay | Admitting: Emergency Medicine

## 2005-02-19 ENCOUNTER — Ambulatory Visit: Payer: Self-pay | Admitting: Family Medicine

## 2005-02-22 ENCOUNTER — Ambulatory Visit: Payer: Self-pay | Admitting: Sports Medicine

## 2005-02-24 ENCOUNTER — Ambulatory Visit: Payer: Self-pay | Admitting: Family Medicine

## 2005-03-01 ENCOUNTER — Ambulatory Visit: Payer: Self-pay | Admitting: Sports Medicine

## 2005-03-08 ENCOUNTER — Ambulatory Visit: Payer: Self-pay | Admitting: Family Medicine

## 2005-03-15 IMAGING — CT CT HEAD W/O CM
1 series · 16 of 30 positions shown, 20 images · non-contrast
Comparison: 12/15/04 and 01/21/03.

CLINICAL DATA: Headache. 
 HEAD CT, 02/10/05:
TECHNIQUE: Contiguous axial CT images were taken from the skull base through the vertex without contrast administration.   
 No evidence of acute intracranial abnormality including hemorrhage, infarct, mass, mass effect, midline shift or abnormal extra-axial fluid collection is identified.  There is no hydrocephalus.  Bone windows are negative.

[Series 2: head_seq 4.5 h40s st · axial · 0.43mm/px · z∈[-161,-35]mm · 16 of 32 slices shown, 20 images]
[im 2/32  brain]
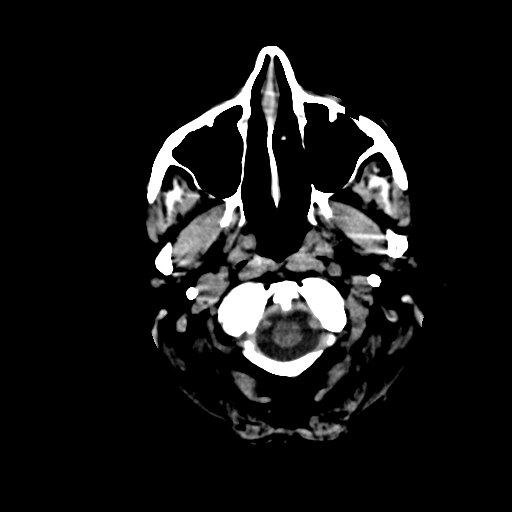
[im 2/32  bone]
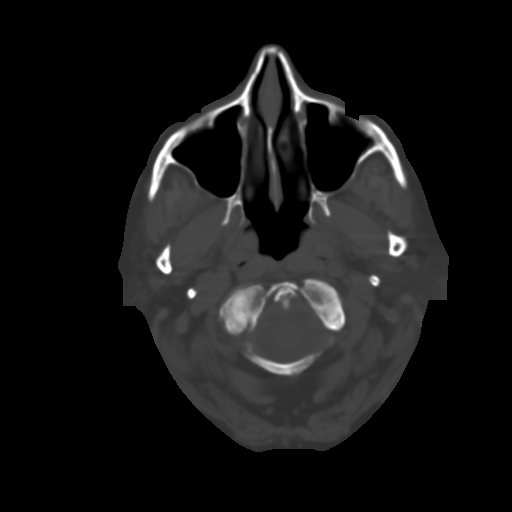
[im 4/32  brain]
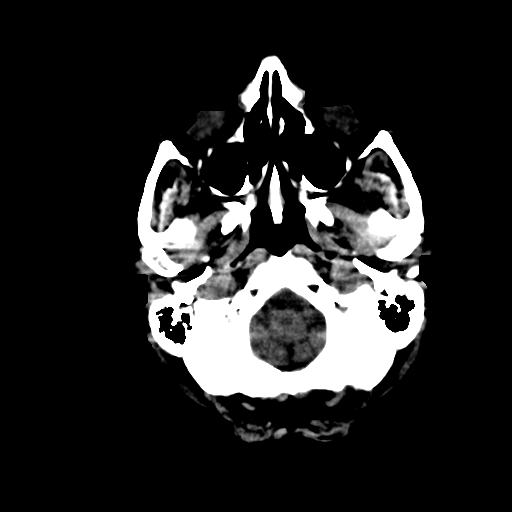
[im 6/32  brain]
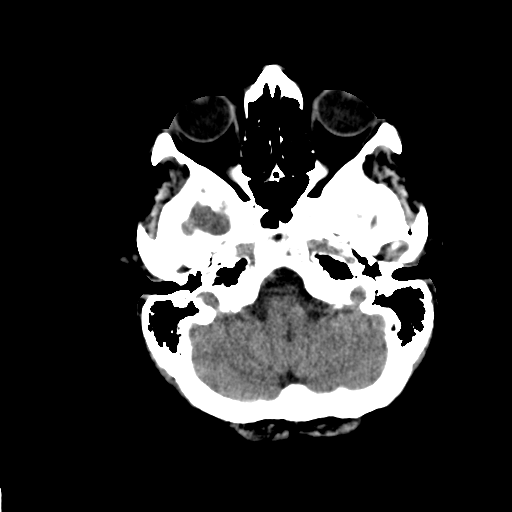
[im 8/32  brain]
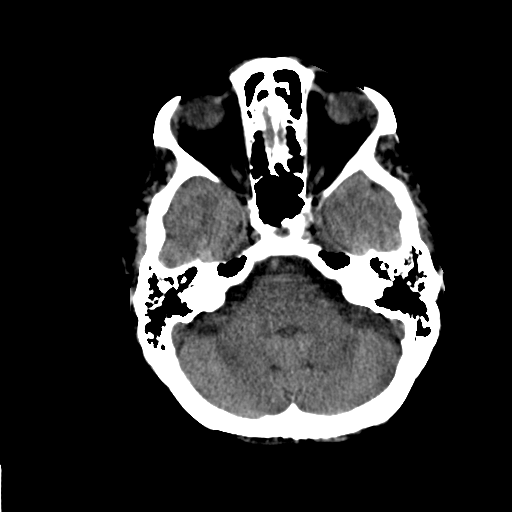
[im 9/32  brain]
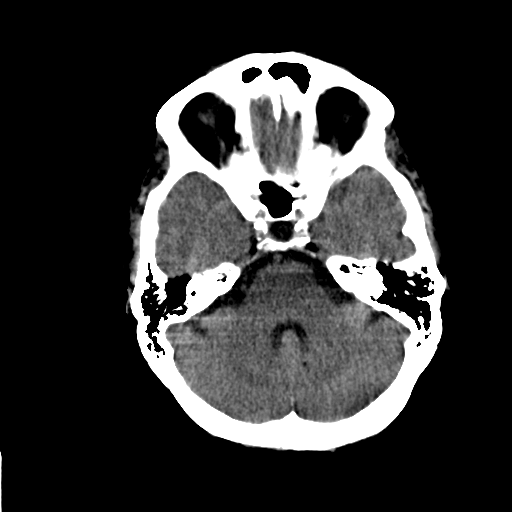
[im 9/32  bone]
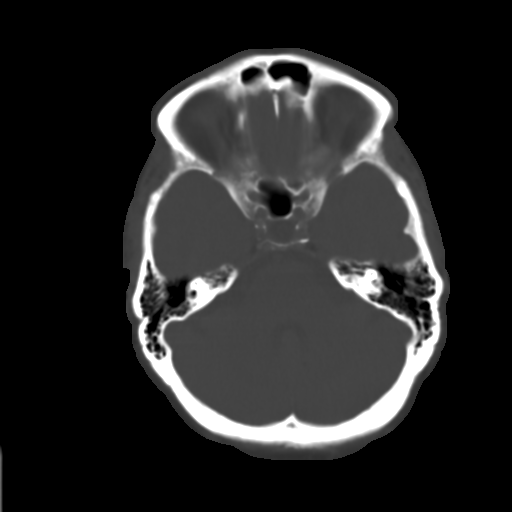
[im 11/32  brain]
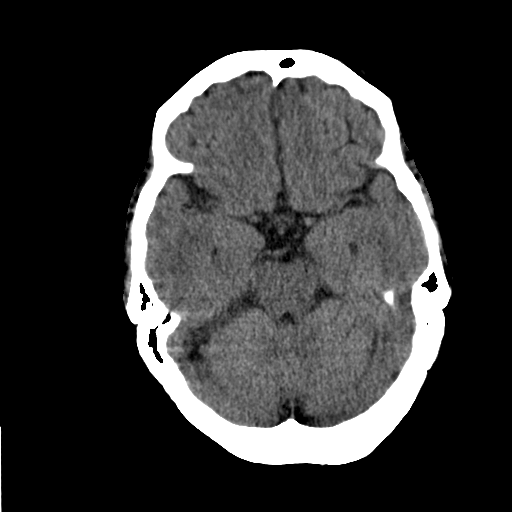
[im 13/32  brain]
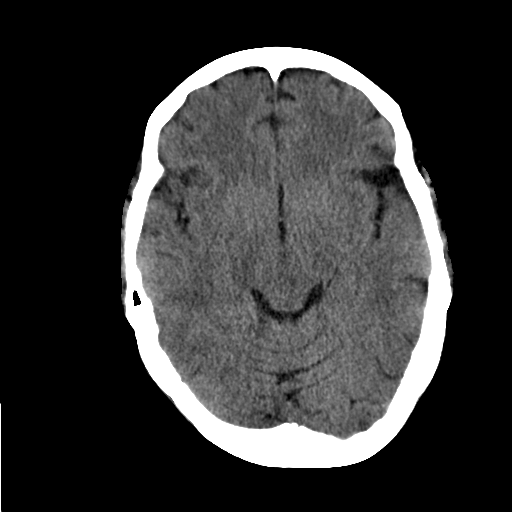
[im 15/32  brain]
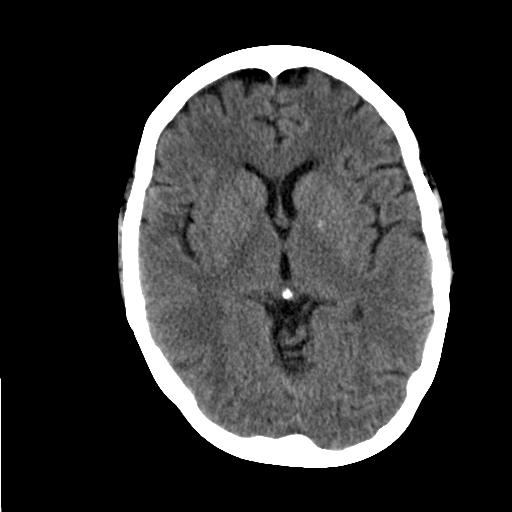
[im 17/32  brain]
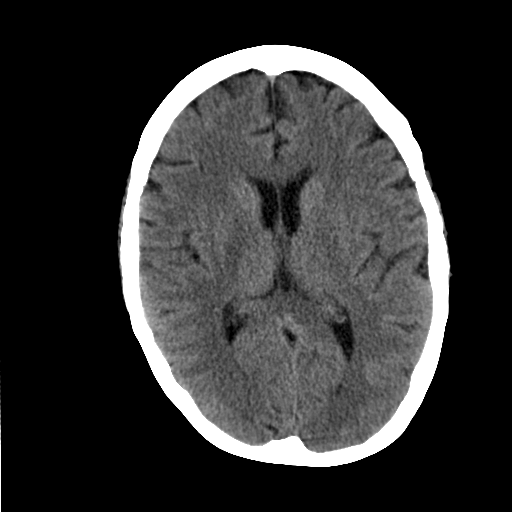
[im 17/32  bone]
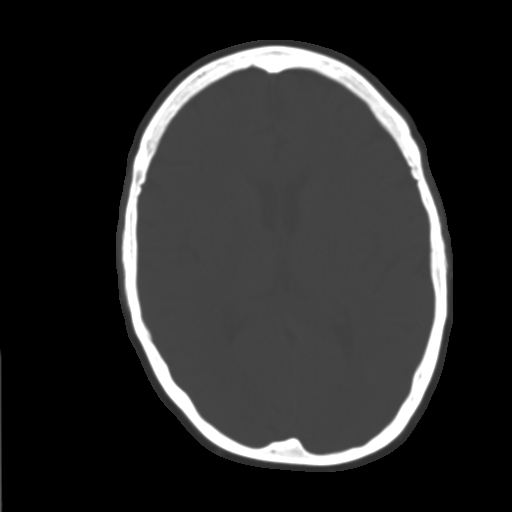
[im 19/32  brain]
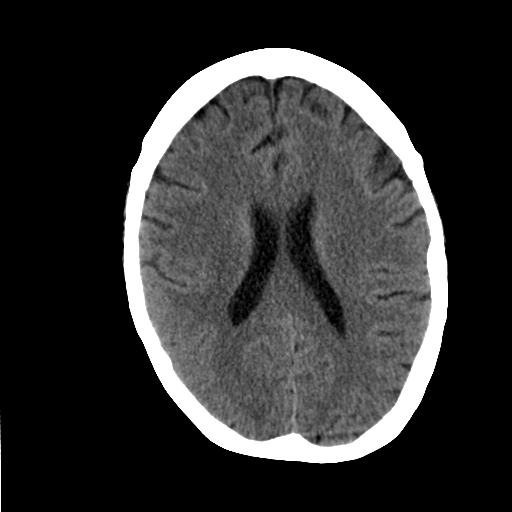
[im 21/32  brain]
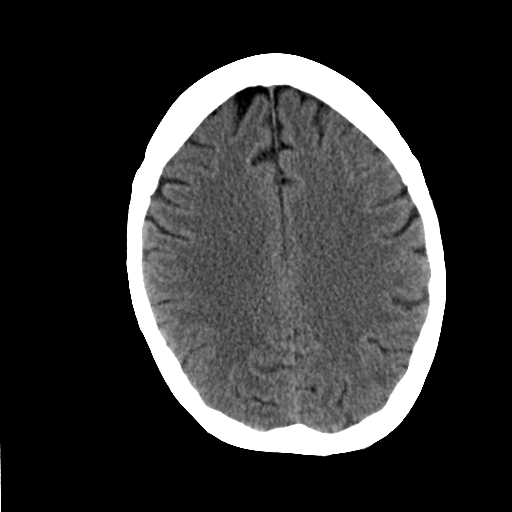
[im 23/32  brain]
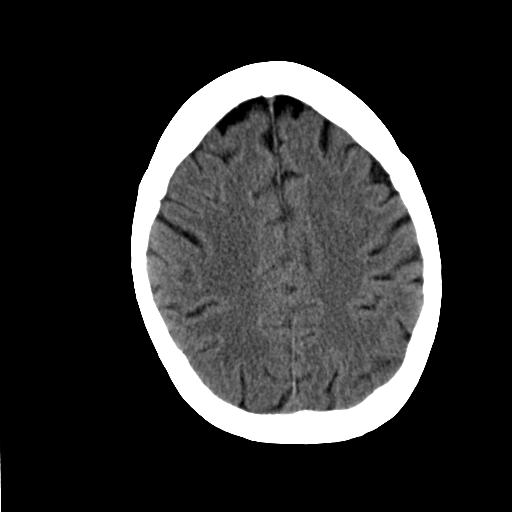
[im 24/32  brain]
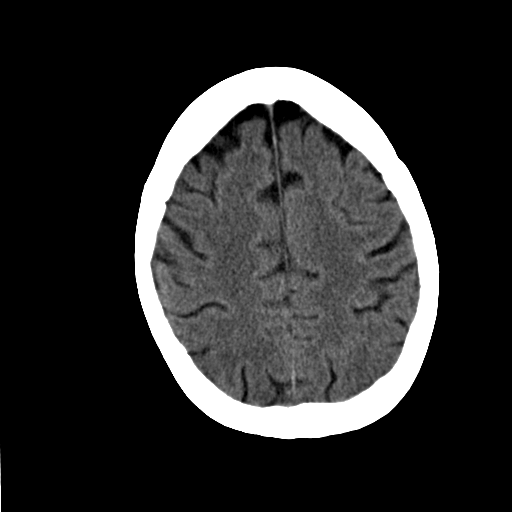
[im 24/32  bone]
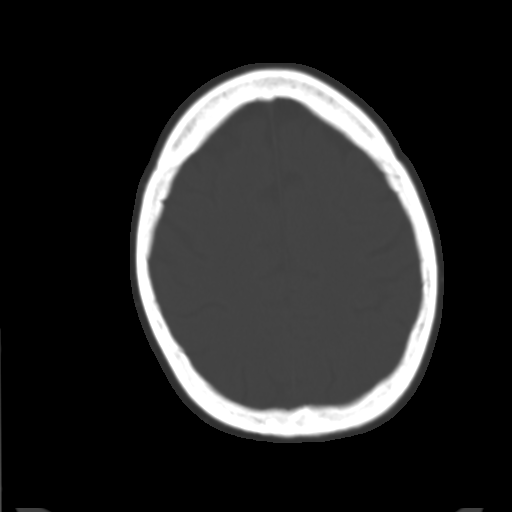
[im 26/32  brain]
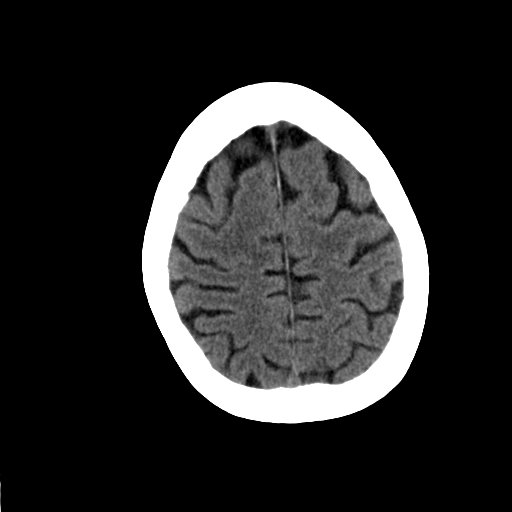
[im 28/32  brain]
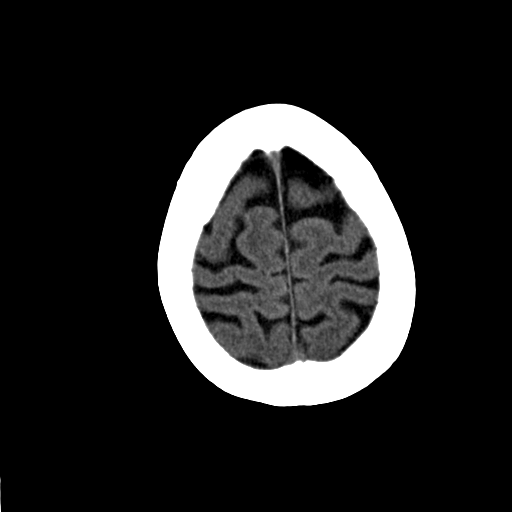
[im 30/32  brain]
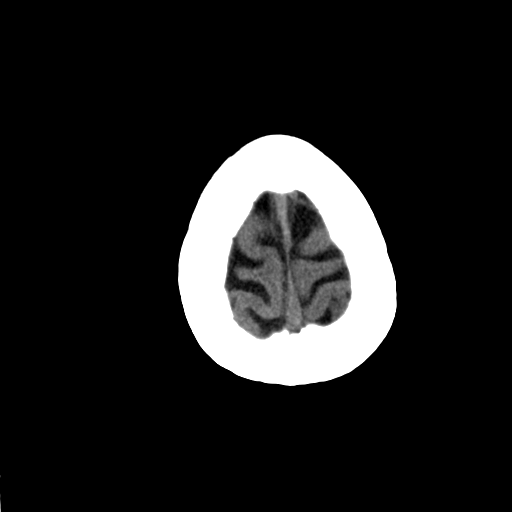

[16 of 30 positions shown; findings below may reference images not displayed]

IMPRESSION: Negative head CT.

## 2005-03-19 ENCOUNTER — Ambulatory Visit: Payer: Self-pay | Admitting: Sports Medicine

## 2005-03-24 ENCOUNTER — Encounter: Admission: RE | Admit: 2005-03-24 | Discharge: 2005-03-24 | Payer: Self-pay | Admitting: Sports Medicine

## 2005-04-07 ENCOUNTER — Ambulatory Visit: Payer: Self-pay | Admitting: Family Medicine

## 2005-04-13 ENCOUNTER — Ambulatory Visit: Payer: Self-pay | Admitting: Family Medicine

## 2005-04-21 ENCOUNTER — Ambulatory Visit: Payer: Self-pay | Admitting: Family Medicine

## 2005-05-11 ENCOUNTER — Ambulatory Visit: Payer: Self-pay | Admitting: Family Medicine

## 2005-06-01 ENCOUNTER — Ambulatory Visit: Payer: Self-pay | Admitting: Family Medicine

## 2005-06-11 ENCOUNTER — Emergency Department (HOSPITAL_COMMUNITY): Admission: EM | Admit: 2005-06-11 | Discharge: 2005-06-11 | Payer: Self-pay | Admitting: Emergency Medicine

## 2005-06-14 ENCOUNTER — Emergency Department (HOSPITAL_COMMUNITY): Admission: EM | Admit: 2005-06-14 | Discharge: 2005-06-14 | Payer: Self-pay | Admitting: Emergency Medicine

## 2005-06-23 ENCOUNTER — Ambulatory Visit: Payer: Self-pay | Admitting: Family Medicine

## 2005-07-06 ENCOUNTER — Ambulatory Visit: Payer: Self-pay | Admitting: Family Medicine

## 2005-07-14 IMAGING — CR DG CHEST 2V
2 series · 2 of 2 positions shown · non-contrast
Comparison: 12/16/04.

CLINICAL DATA: Shortness of breath, cough, asthma. Previous smoker. 
 CHEST - 2 VIEW:

[view not recorded (1 of 2)]
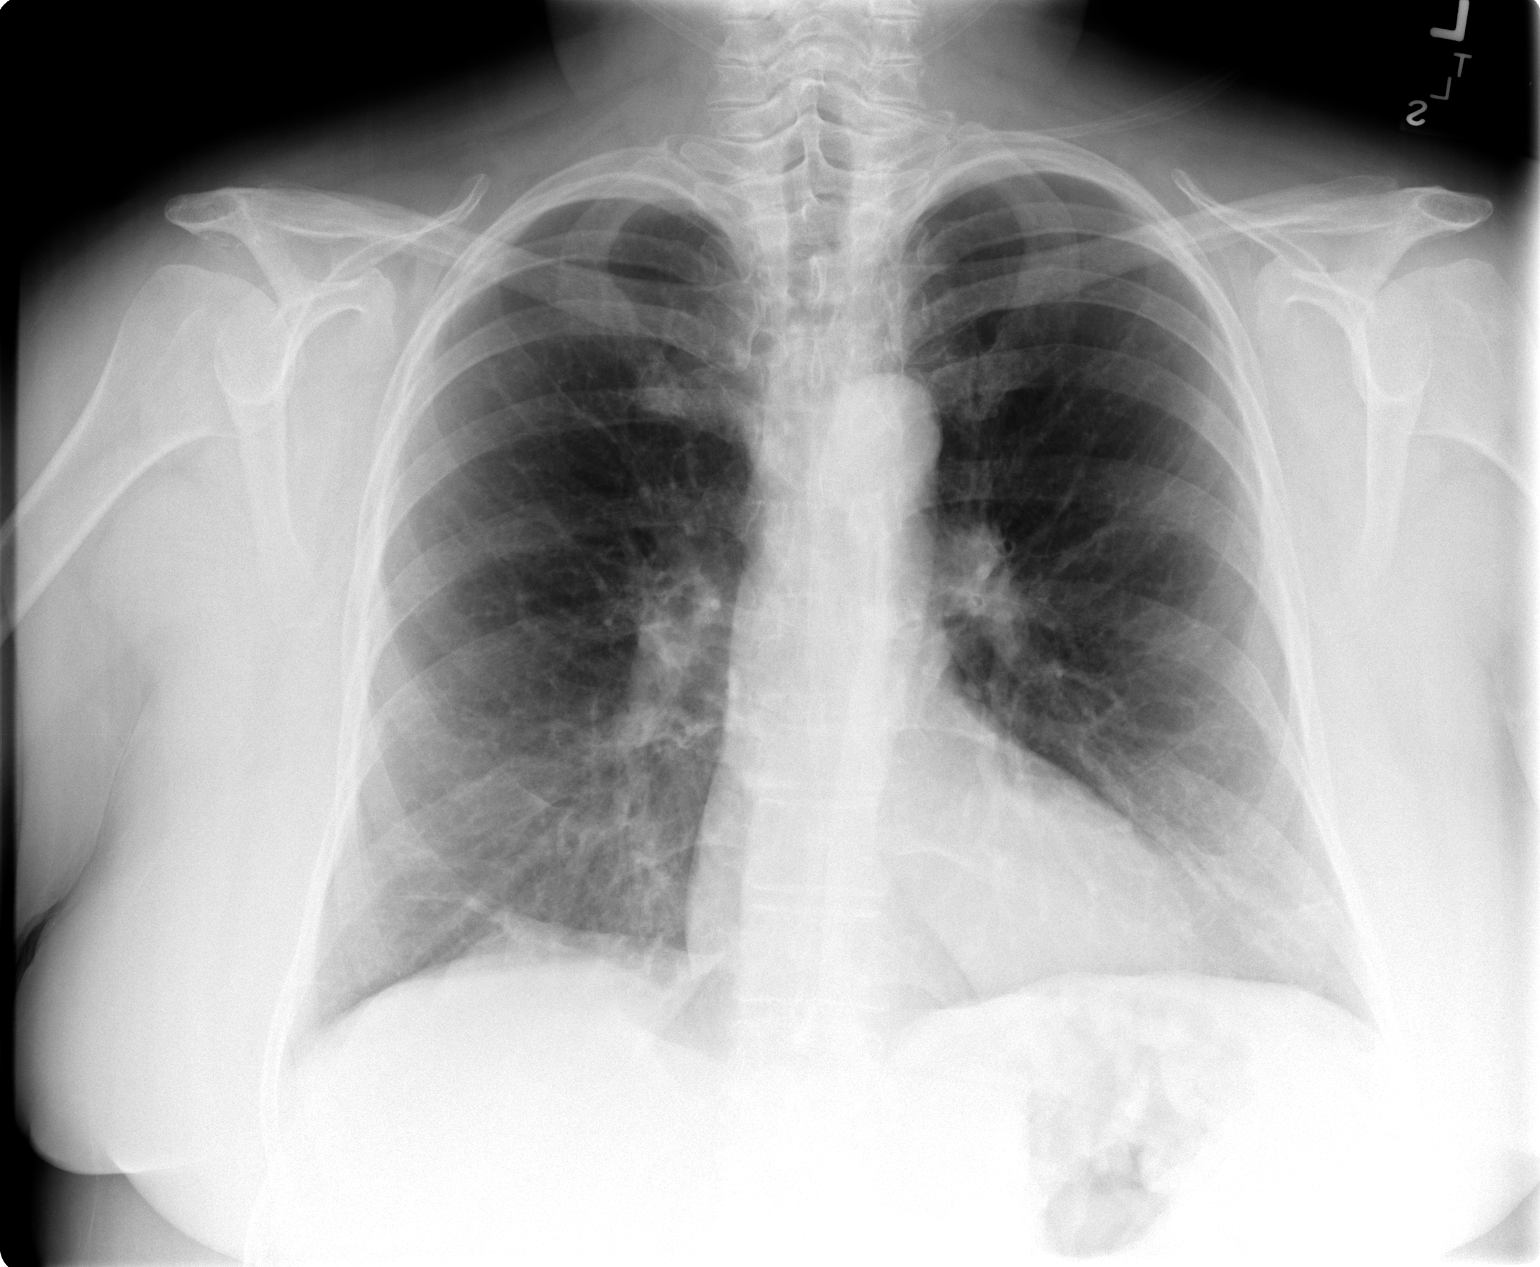

[view not recorded (2 of 2)]
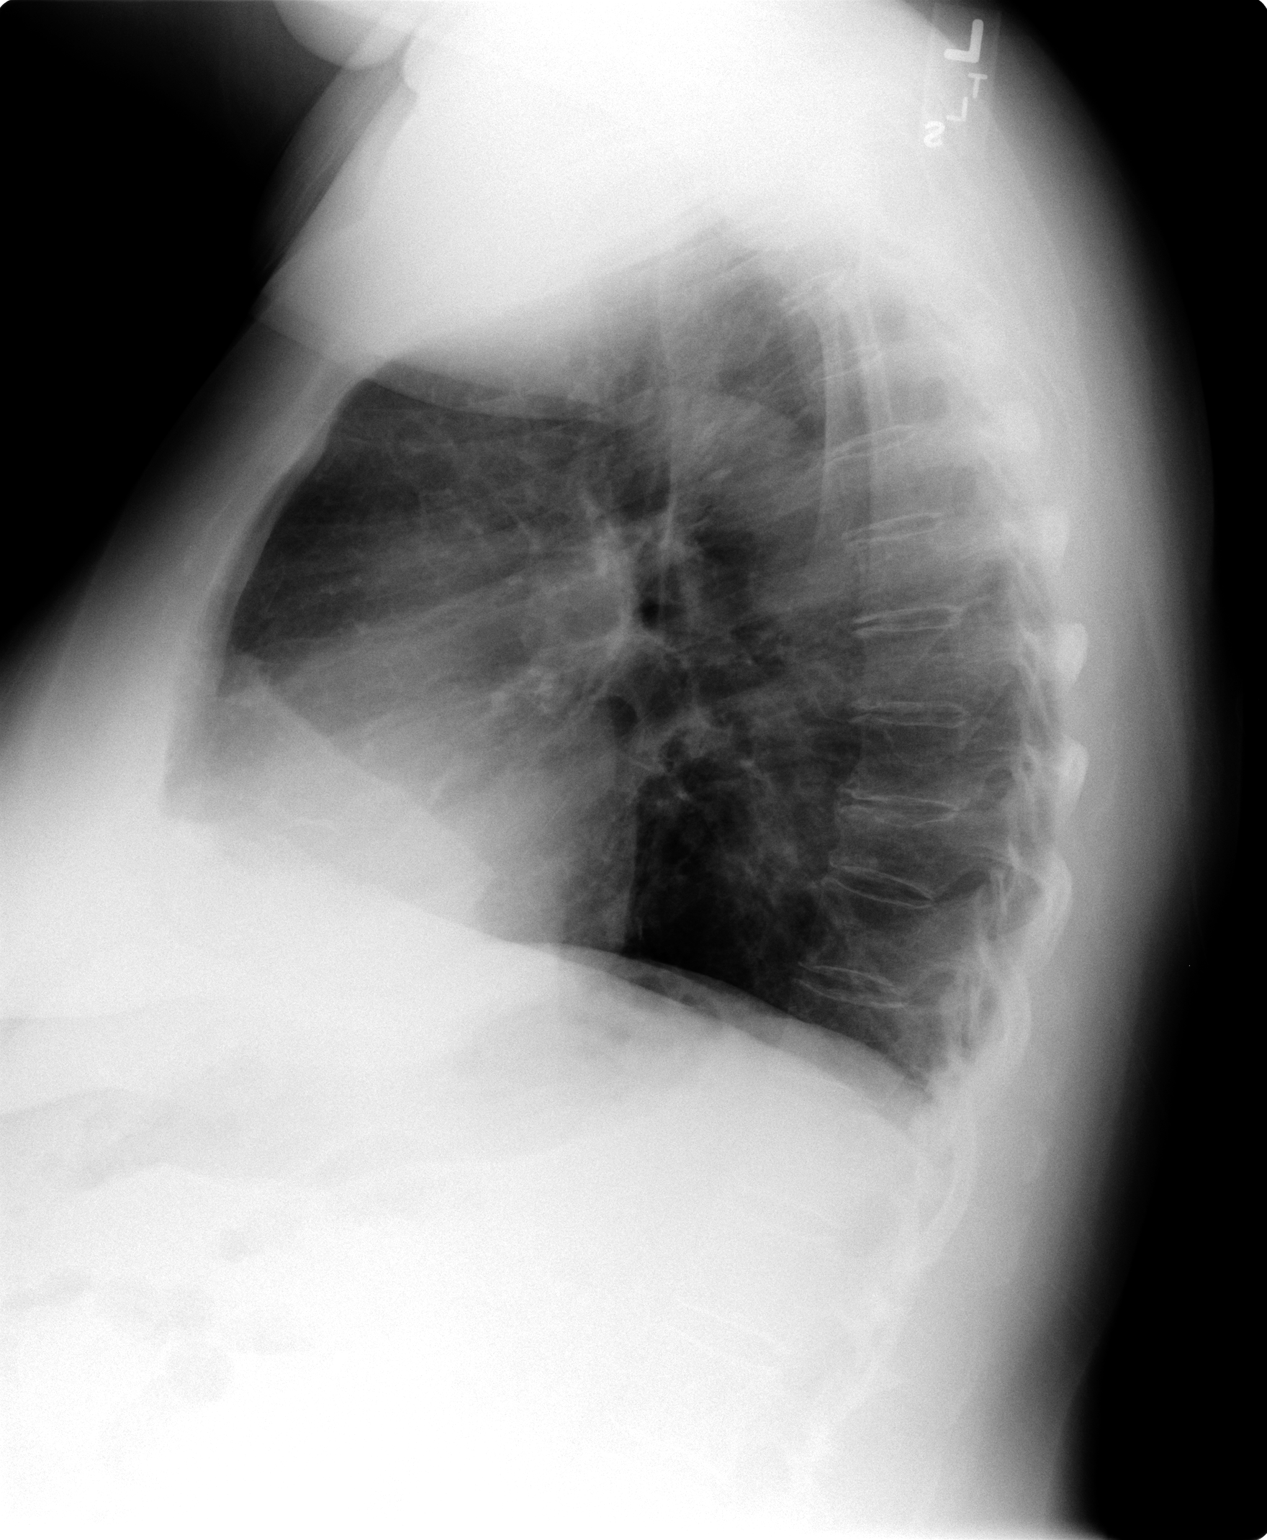

[2 of 2 positions shown; findings below may reference images not displayed]

Cardiac size within normal limits to slightly enlarged.  No pulmonary vascular congestion.  Negative for active infiltrate, chronic bronchitic changes, linear scarring right, lung base.
IMPRESSION: COPD ? no acute chest findings.

## 2005-07-17 IMAGING — CR DG CHEST 2V
2 series · 2 of 2 positions shown · non-contrast
Comparison: 06/11/05.

CLINICAL DATA: Shortness of breath.
 CHEST - 2 VIEW - 06/14/05:

[view not recorded (1 of 2)]
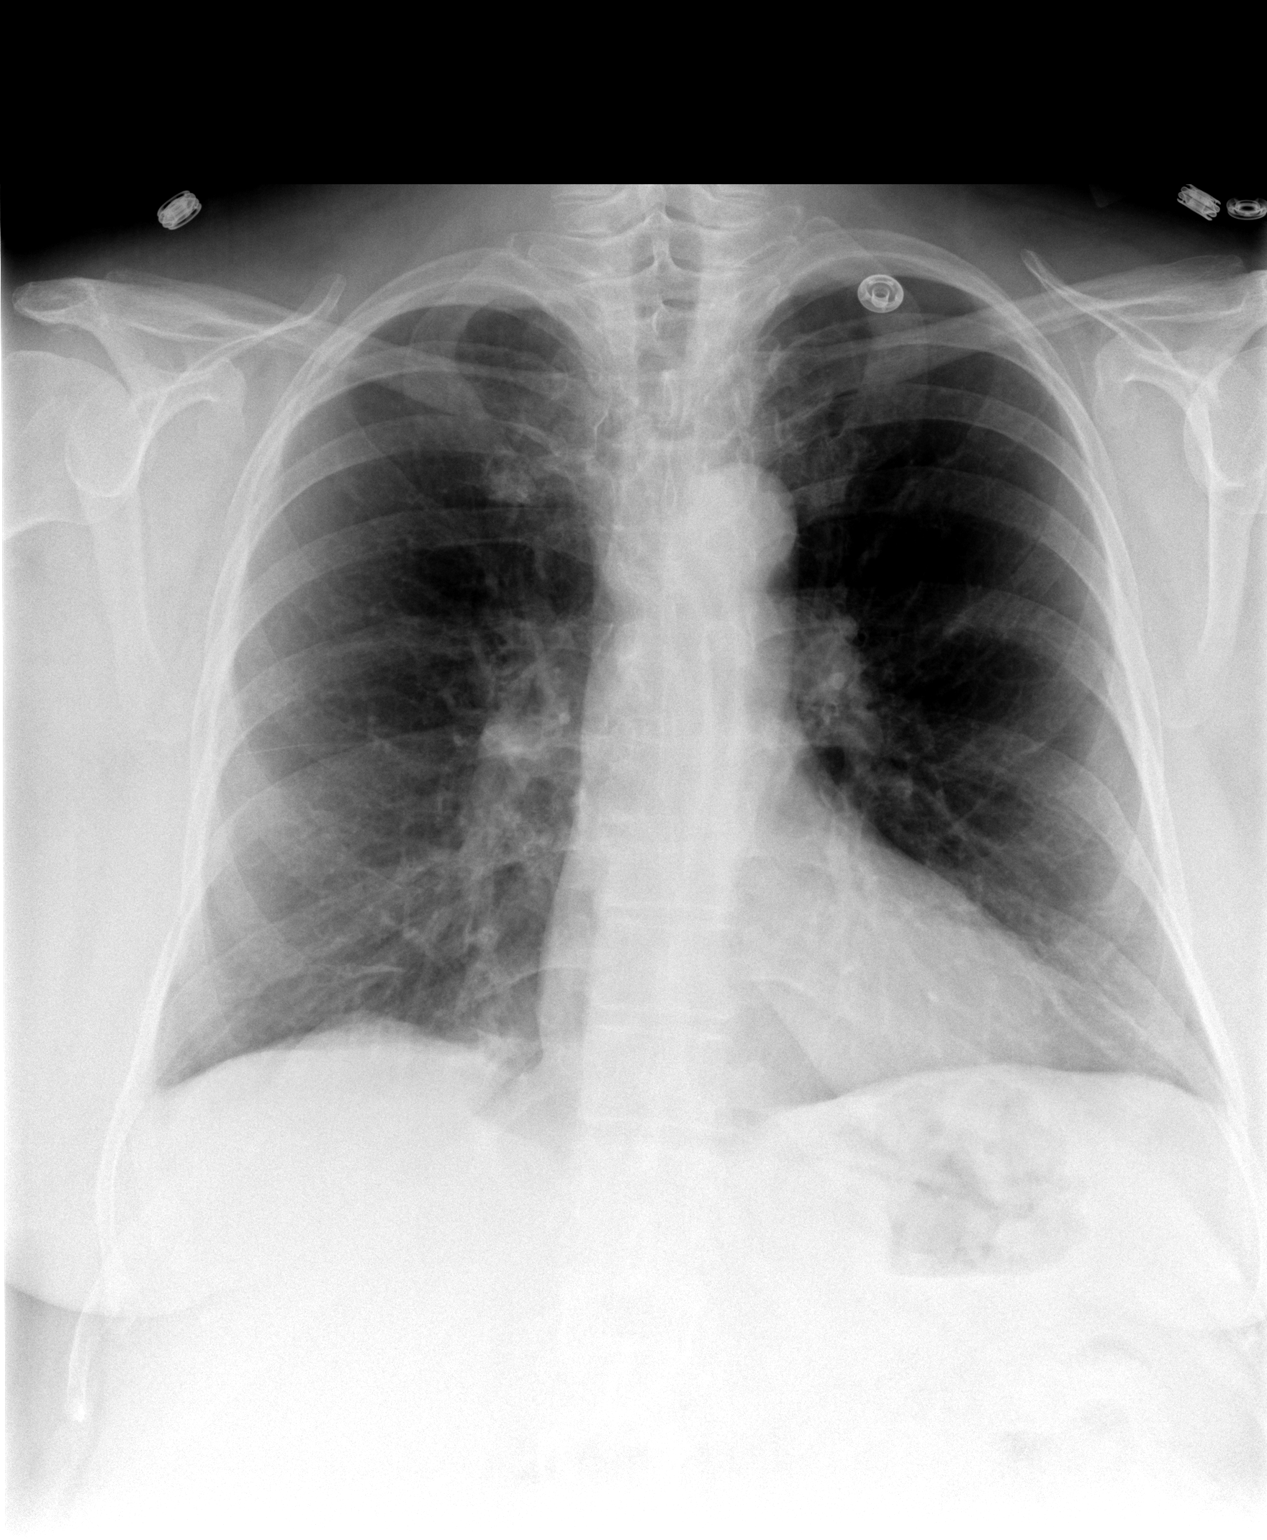

[view not recorded (2 of 2)]
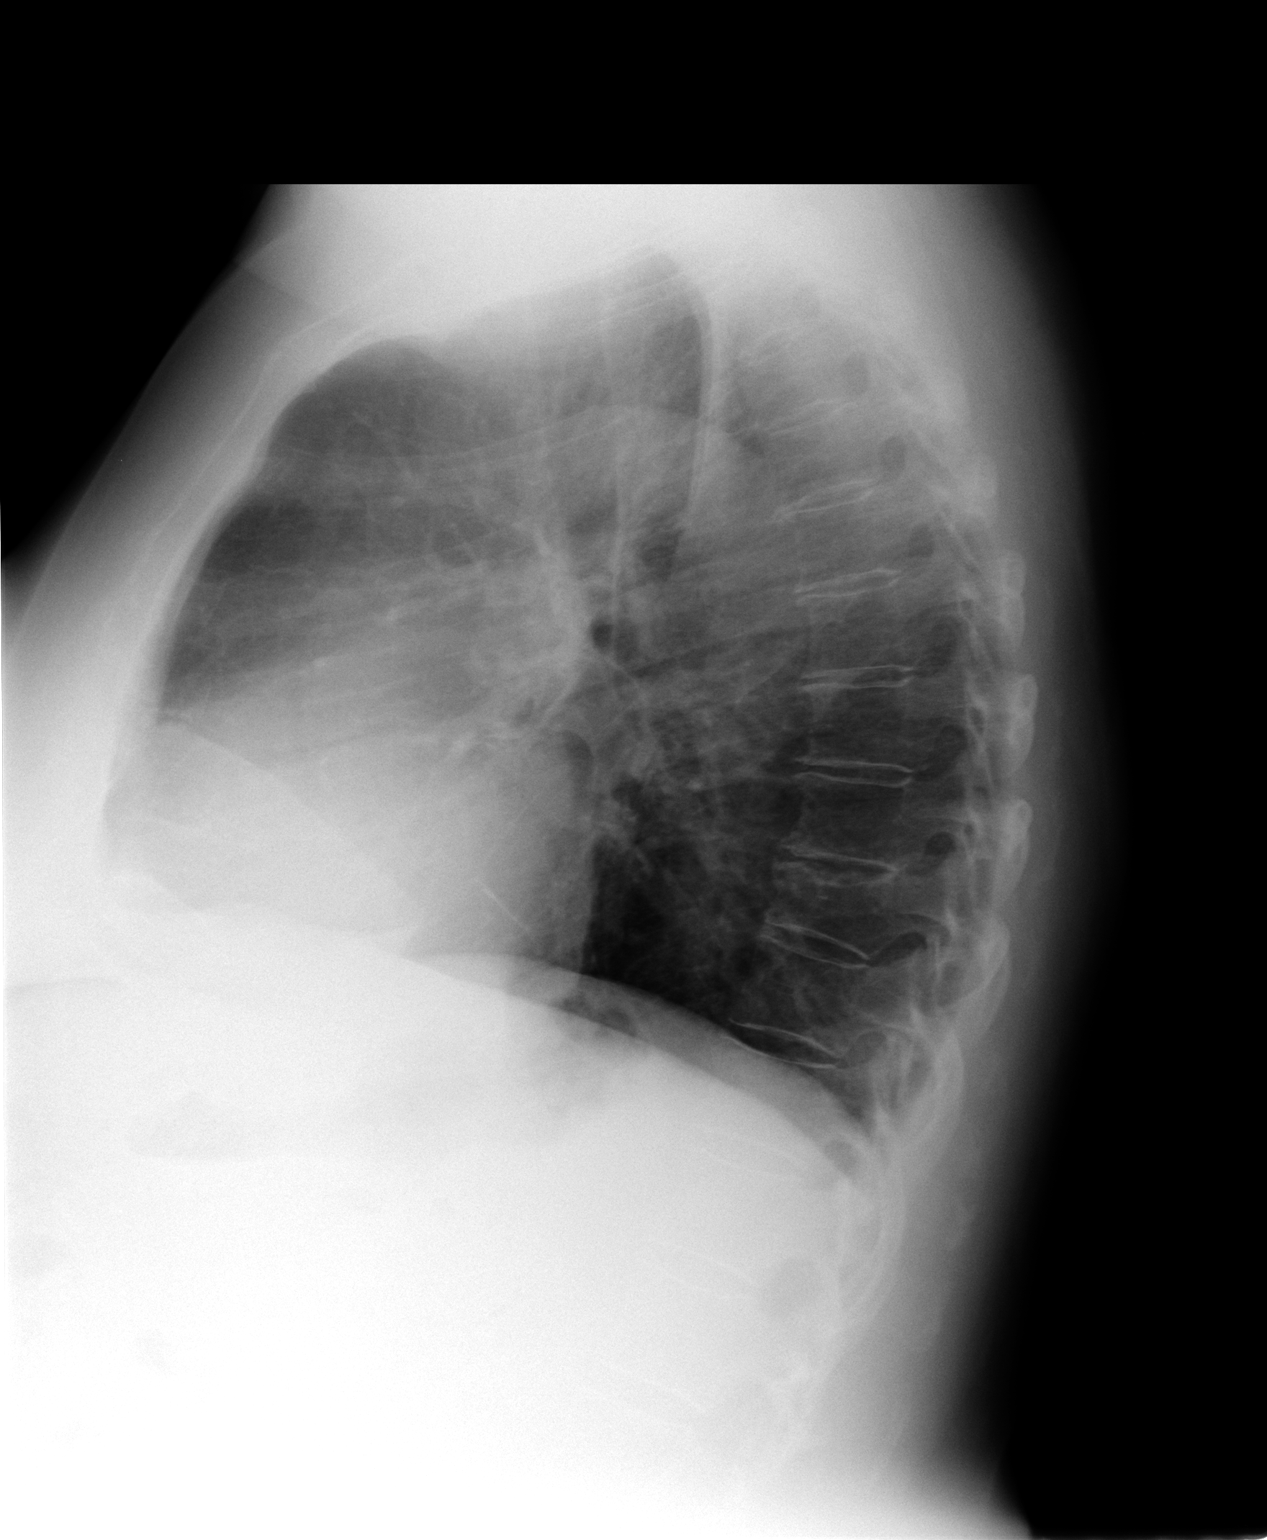

[2 of 2 positions shown; findings below may reference images not displayed]

FINDINGS: There is some flattening of the hemidiaphragms.  Lungs are clear.  Heart size is normal.  No effusion.  Note is made of a compression fracture of the T9 vertebral body, which is unchanged.
IMPRESSION: No acute process, with changes of emphysema again noted.

## 2005-07-20 ENCOUNTER — Ambulatory Visit: Payer: Self-pay | Admitting: Sports Medicine

## 2005-08-06 ENCOUNTER — Ambulatory Visit: Payer: Self-pay | Admitting: Family Medicine

## 2005-08-20 ENCOUNTER — Ambulatory Visit: Payer: Self-pay | Admitting: Family Medicine

## 2005-08-27 ENCOUNTER — Ambulatory Visit: Payer: Self-pay | Admitting: Family Medicine

## 2005-09-03 ENCOUNTER — Ambulatory Visit: Payer: Self-pay | Admitting: Family Medicine

## 2005-09-17 ENCOUNTER — Ambulatory Visit: Payer: Self-pay | Admitting: Family Medicine

## 2005-09-22 ENCOUNTER — Emergency Department (HOSPITAL_COMMUNITY): Admission: EM | Admit: 2005-09-22 | Discharge: 2005-09-22 | Payer: Self-pay | Admitting: Emergency Medicine

## 2005-09-30 ENCOUNTER — Ambulatory Visit: Payer: Self-pay | Admitting: Family Medicine

## 2005-10-06 ENCOUNTER — Ambulatory Visit: Payer: Self-pay | Admitting: Family Medicine

## 2005-10-14 ENCOUNTER — Ambulatory Visit: Payer: Self-pay | Admitting: Family Medicine

## 2005-10-29 ENCOUNTER — Ambulatory Visit: Payer: Self-pay | Admitting: Family Medicine

## 2005-11-03 ENCOUNTER — Emergency Department (HOSPITAL_COMMUNITY): Admission: EM | Admit: 2005-11-03 | Discharge: 2005-11-04 | Payer: Self-pay | Admitting: Emergency Medicine

## 2005-11-08 ENCOUNTER — Ambulatory Visit: Payer: Self-pay | Admitting: Family Medicine

## 2005-11-09 ENCOUNTER — Emergency Department (HOSPITAL_COMMUNITY): Admission: EM | Admit: 2005-11-09 | Discharge: 2005-11-09 | Payer: Self-pay | Admitting: Emergency Medicine

## 2005-11-10 ENCOUNTER — Emergency Department (HOSPITAL_COMMUNITY): Admission: EM | Admit: 2005-11-10 | Discharge: 2005-11-10 | Payer: Self-pay | Admitting: Emergency Medicine

## 2005-11-11 ENCOUNTER — Ambulatory Visit: Payer: Self-pay | Admitting: Family Medicine

## 2005-11-11 ENCOUNTER — Inpatient Hospital Stay (HOSPITAL_COMMUNITY): Admission: AD | Admit: 2005-11-11 | Discharge: 2005-11-12 | Payer: Self-pay | Admitting: Dermatology

## 2005-11-19 ENCOUNTER — Emergency Department (HOSPITAL_COMMUNITY): Admission: EM | Admit: 2005-11-19 | Discharge: 2005-11-19 | Payer: Self-pay | Admitting: Emergency Medicine

## 2005-11-21 ENCOUNTER — Emergency Department (HOSPITAL_COMMUNITY): Admission: EM | Admit: 2005-11-21 | Discharge: 2005-11-21 | Payer: Self-pay | Admitting: Emergency Medicine

## 2005-11-24 ENCOUNTER — Emergency Department (HOSPITAL_COMMUNITY): Admission: EM | Admit: 2005-11-24 | Discharge: 2005-11-24 | Payer: Self-pay | Admitting: Emergency Medicine

## 2005-11-25 ENCOUNTER — Emergency Department (HOSPITAL_COMMUNITY): Admission: EM | Admit: 2005-11-25 | Discharge: 2005-11-25 | Payer: Self-pay | Admitting: Emergency Medicine

## 2005-11-26 ENCOUNTER — Ambulatory Visit: Payer: Self-pay | Admitting: Family Medicine

## 2005-11-27 ENCOUNTER — Emergency Department (HOSPITAL_COMMUNITY): Admission: EM | Admit: 2005-11-27 | Discharge: 2005-11-27 | Payer: Self-pay | Admitting: Emergency Medicine

## 2005-12-06 ENCOUNTER — Ambulatory Visit: Payer: Self-pay | Admitting: Family Medicine

## 2005-12-06 IMAGING — CR DG CHEST 1V PORT
1 series · 1 of 1 positions shown · non-contrast
Comparison: 06/14/05.

CLINICAL DATA: Shortness of breath. 
 PORTABLE CHEST - 1 VIEW:

[view not recorded]
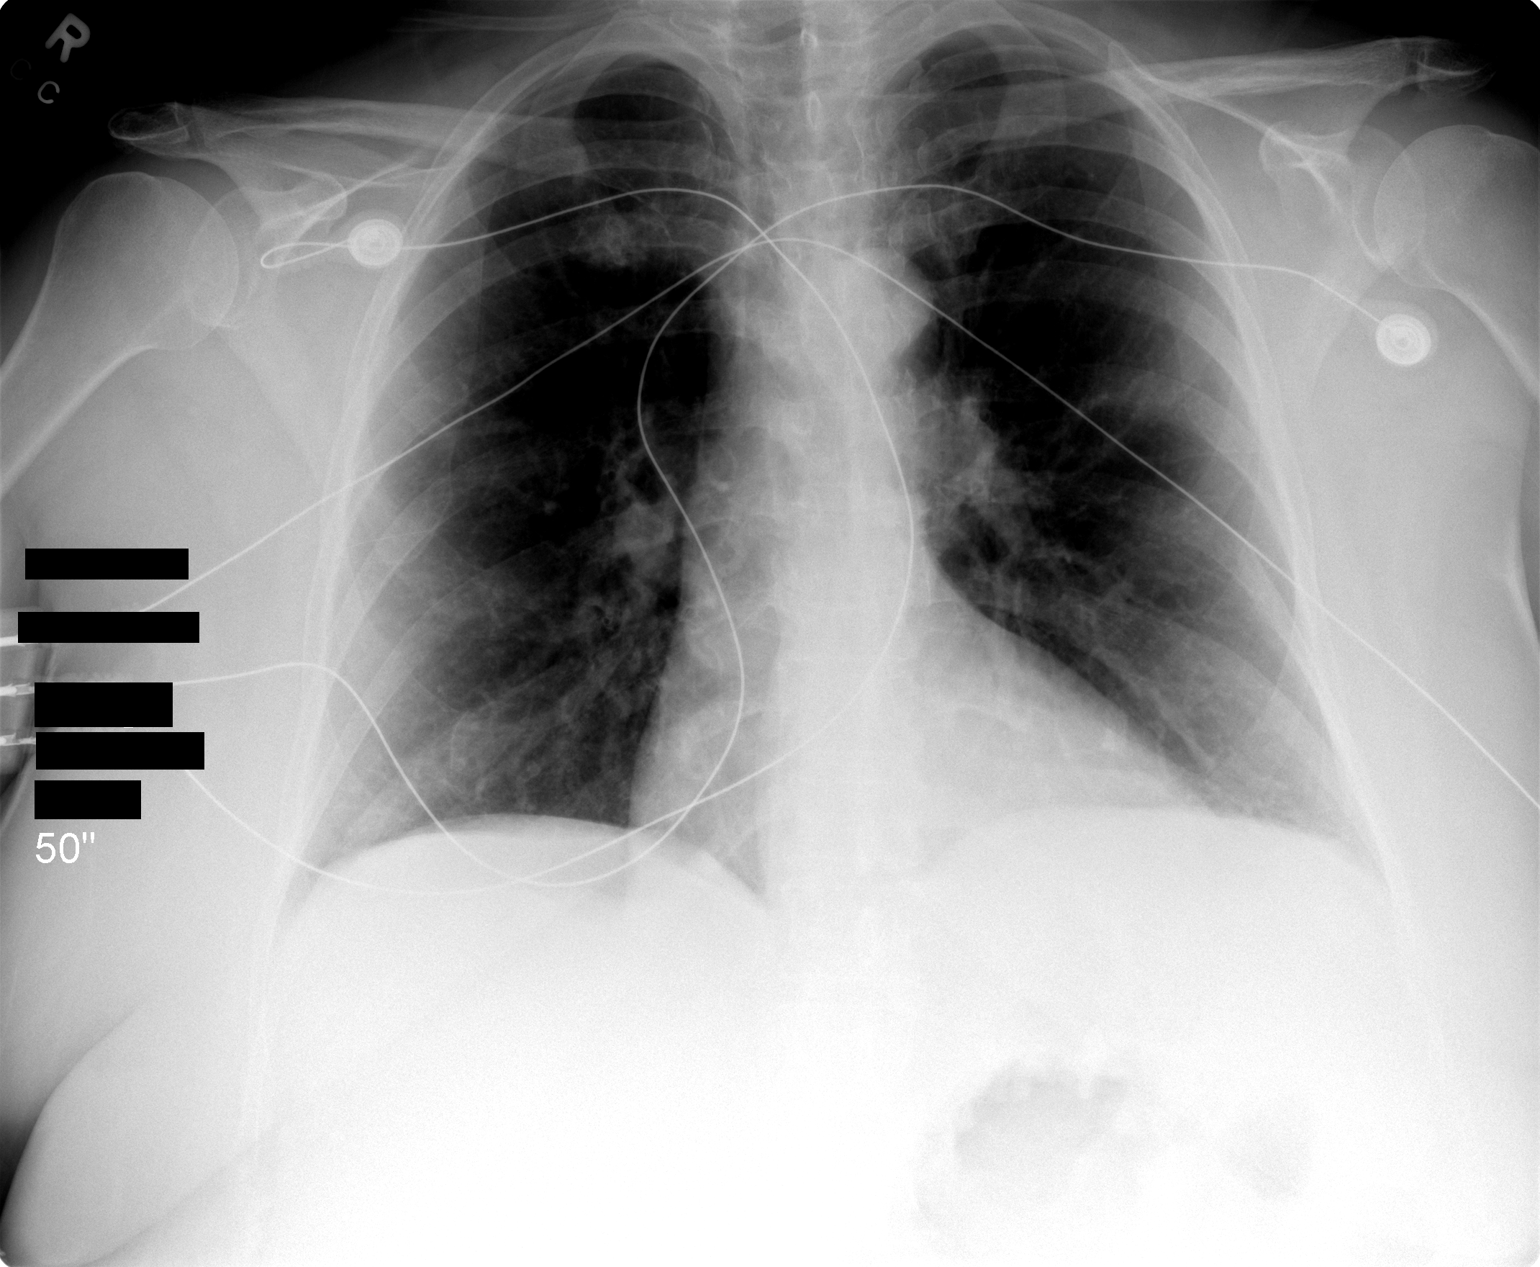

[1 of 1 positions shown; findings below may reference images not displayed]

FINDINGS: The heart size and mediastinal contours are within normal limits.  Both lungs are clear.
IMPRESSION: No acute findings.

## 2005-12-06 IMAGING — CT CT ANGIO CHEST
2 of 5 series · 19 of 36 positions shown · IV contrast (APPLIED)
Comparison: 12/05/04.

CLINICAL DATA: 64-year-old, with chest pain and shortness of breath. 
 CT ANGIOGRAPHY OF CHEST:
TECHNIQUE: Multidetector CT imaging of the chest was performed during bolus injection of intravenous contrast.  Multiplanar   CT angiographic image reconstructions were generated to evaluate the vascular anatomy.
 Contrast:  80 cc Omnipaque 300

[Series 5: pe 1.0 b20f st · axial · 0.70mm/px · z∈[+1590,+1840]mm · 16 of 278 slices shown]
[im 14/278  lung]
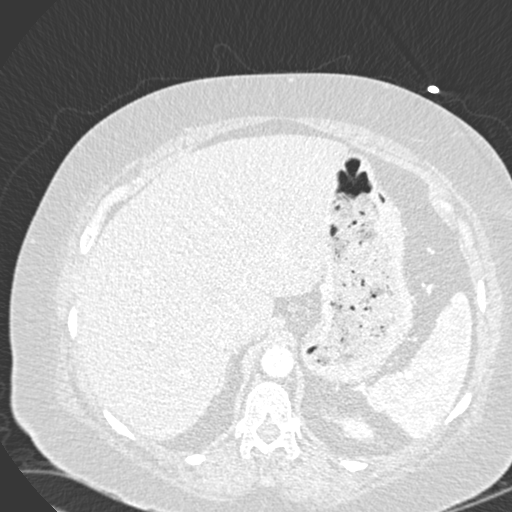
[im 28/278  mediastinal]
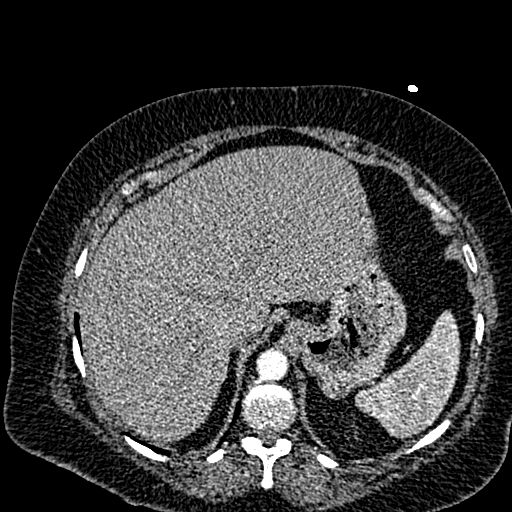
[im 42/278  lung]
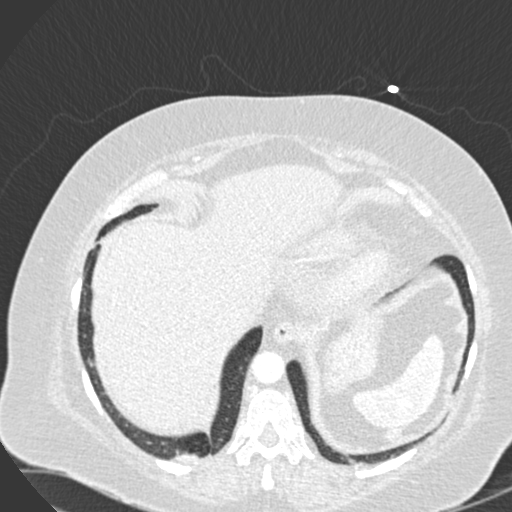
[im 70/278  mediastinal]
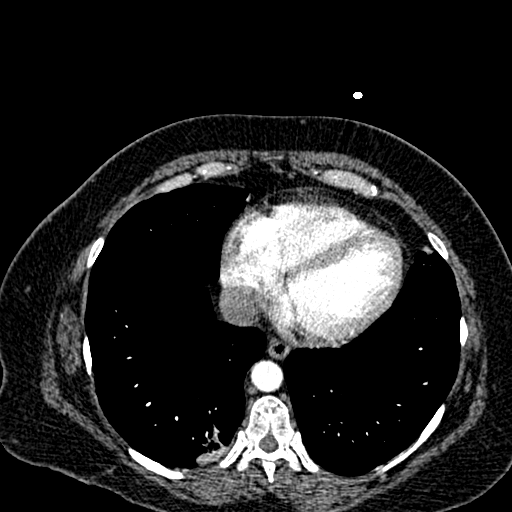
[im 84/278  lung]
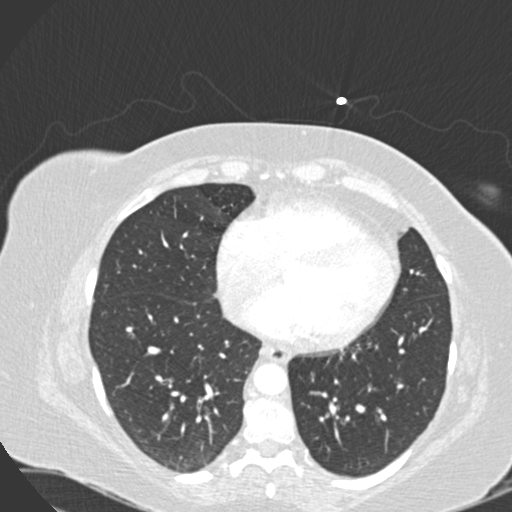
[im 97/278  mediastinal]
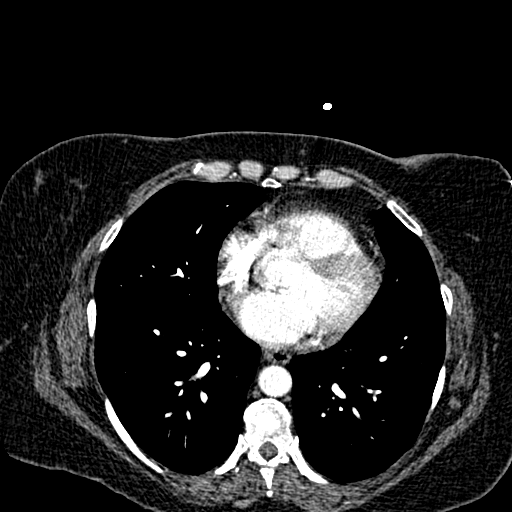
[im 111/278  lung]
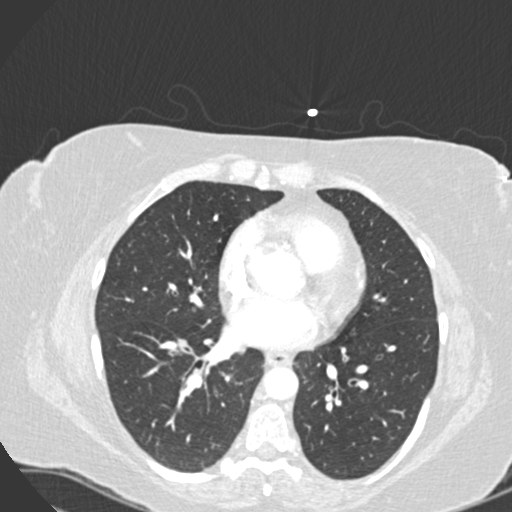
[im 125/278  mediastinal]
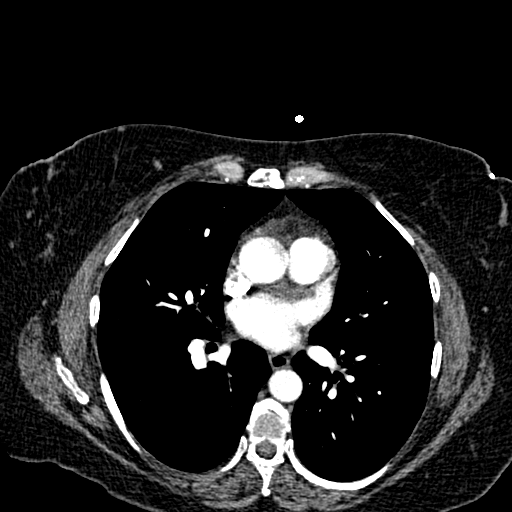
[im 153/278  lung]
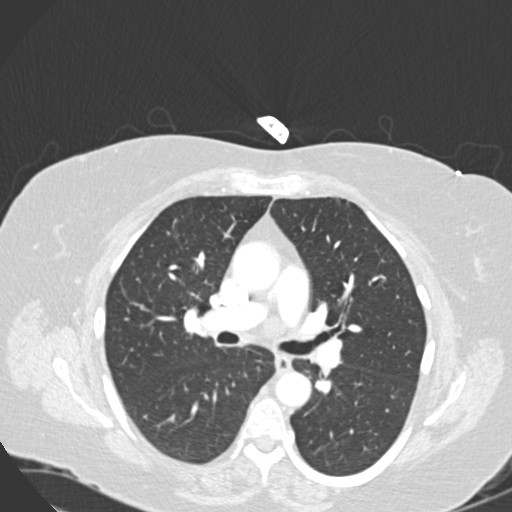
[im 167/278  mediastinal]
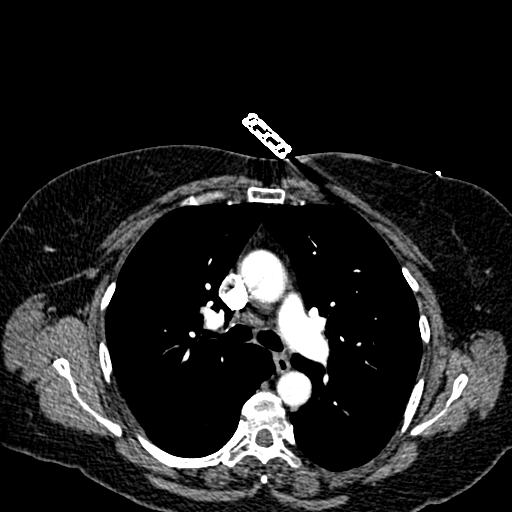
[im 181/278  lung]
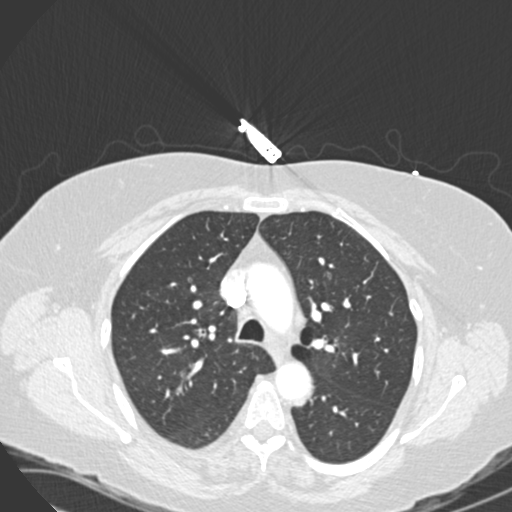
[im 194/278  mediastinal]
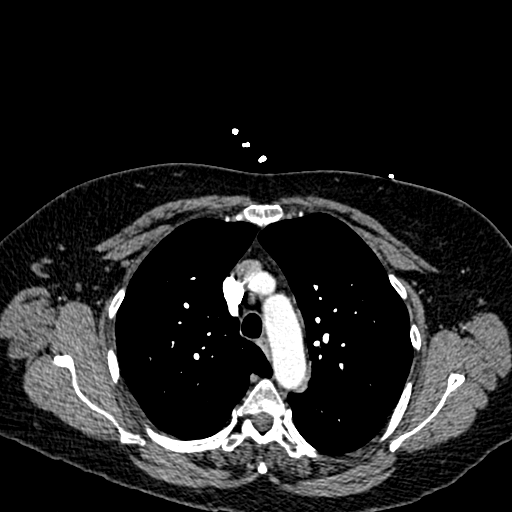
[im 208/278  lung]
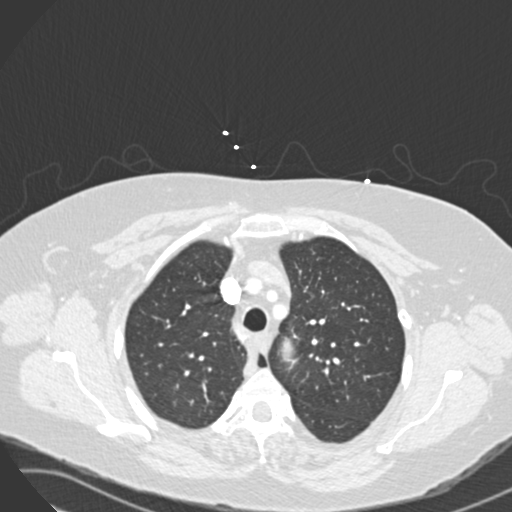
[im 236/278  mediastinal]
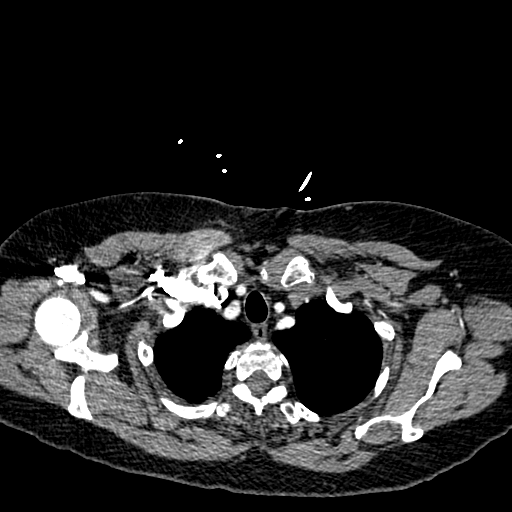
[im 250/278  lung]
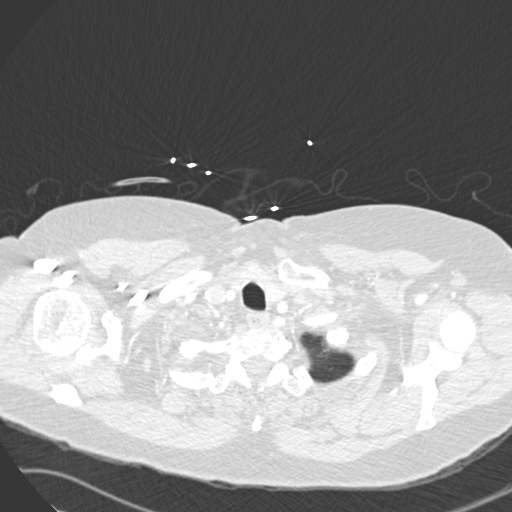
[im 264/278  mediastinal]
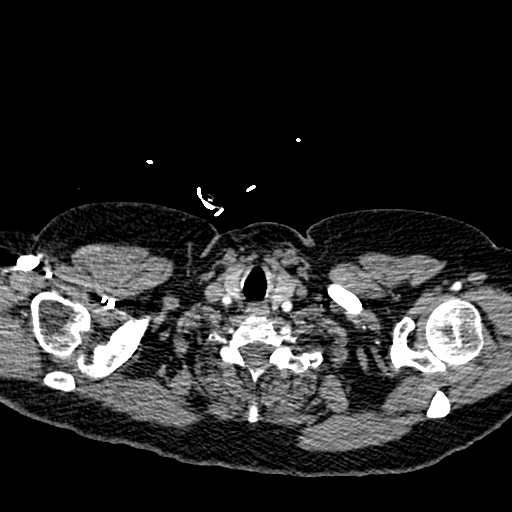

[Series 602: cor · coronal · 0.70mm/px · 3 of 50 slices shown]
[im 10/50  mediastinal]
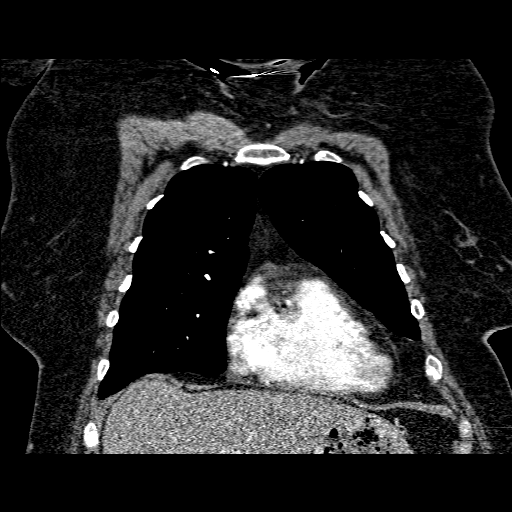
[im 20/50  mediastinal]
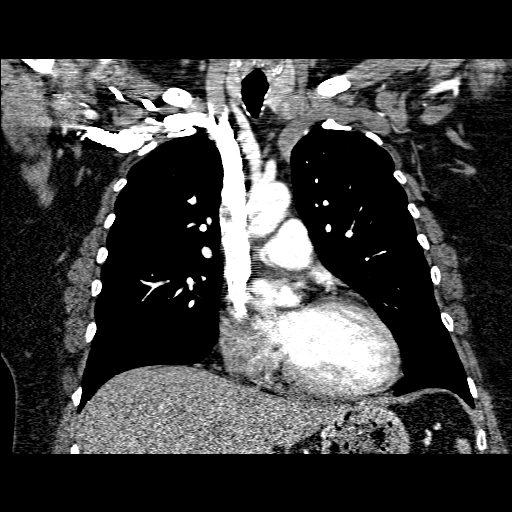
[im 30/50  mediastinal]
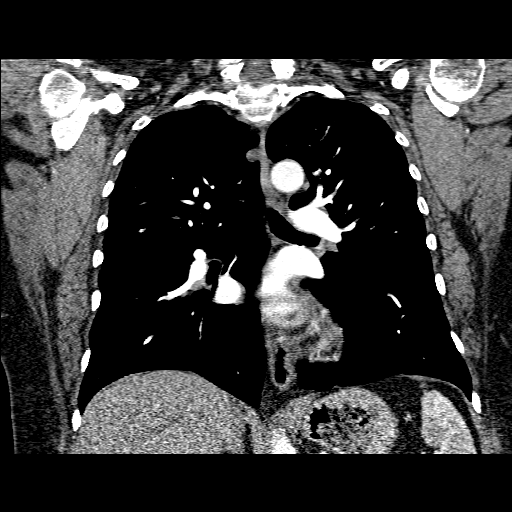

[19 of 36 positions shown; findings below may reference images not displayed]

FINDINGS: The chest wall, soft tissues and bony structures are unremarkable and stable.  No breast masses or axillary adenopathy.  The thyroid gland is grossly normal.  The heart size is normal.  No pericardial effusion.  No mediastinal or hilar adenopathy.  No significant coronary artery calcifications are seen.  The esophagus is grossly normal.  The pulmonary arterial tree is well opacified.  No filling defects are seen to suggest pulmonary emboli.  The aorta is normal in caliber.  No evidence for aortic dissection. 
 Examination of the lung parenchyma demonstrates no acute pulmonary findings such as edema or infiltrate.  There is subpleural subsegmental atelectasis in the right lower lobe.  There is a small stable pulmonary nodule in the right lower lobe on image number 50.  This is likely a benign noncalcified granuloma or intrapulmonary lymph node. 
 The upper abdomen demonstrates no significant findings.
IMPRESSION: 1.  No CT evidence for pulmonary emboli. 
 2.  No acute pulmonary findings. 
 3.  Normal appearance of the thoracic aorta.  
 4.  Small right lower lobe pulmonary nodule, unchanged since prior examination of 12/05/04, likely benign noncalcified granuloma or intrapulmonary lymph node.  
 5.  Subpleural subsegmental atelectasis in the right lower lobe.  This appears to be a chronic finding.

## 2005-12-12 IMAGING — CR DG ABDOMEN ACUTE W/ 1V CHEST
3 series · 3 of 3 positions shown · non-contrast
Comparison: none

CLINICAL DATA: Abdominal pain.  Vomiting, constipation.  
 ACUTE ABDOMINAL SERIES:

[w chest pa]
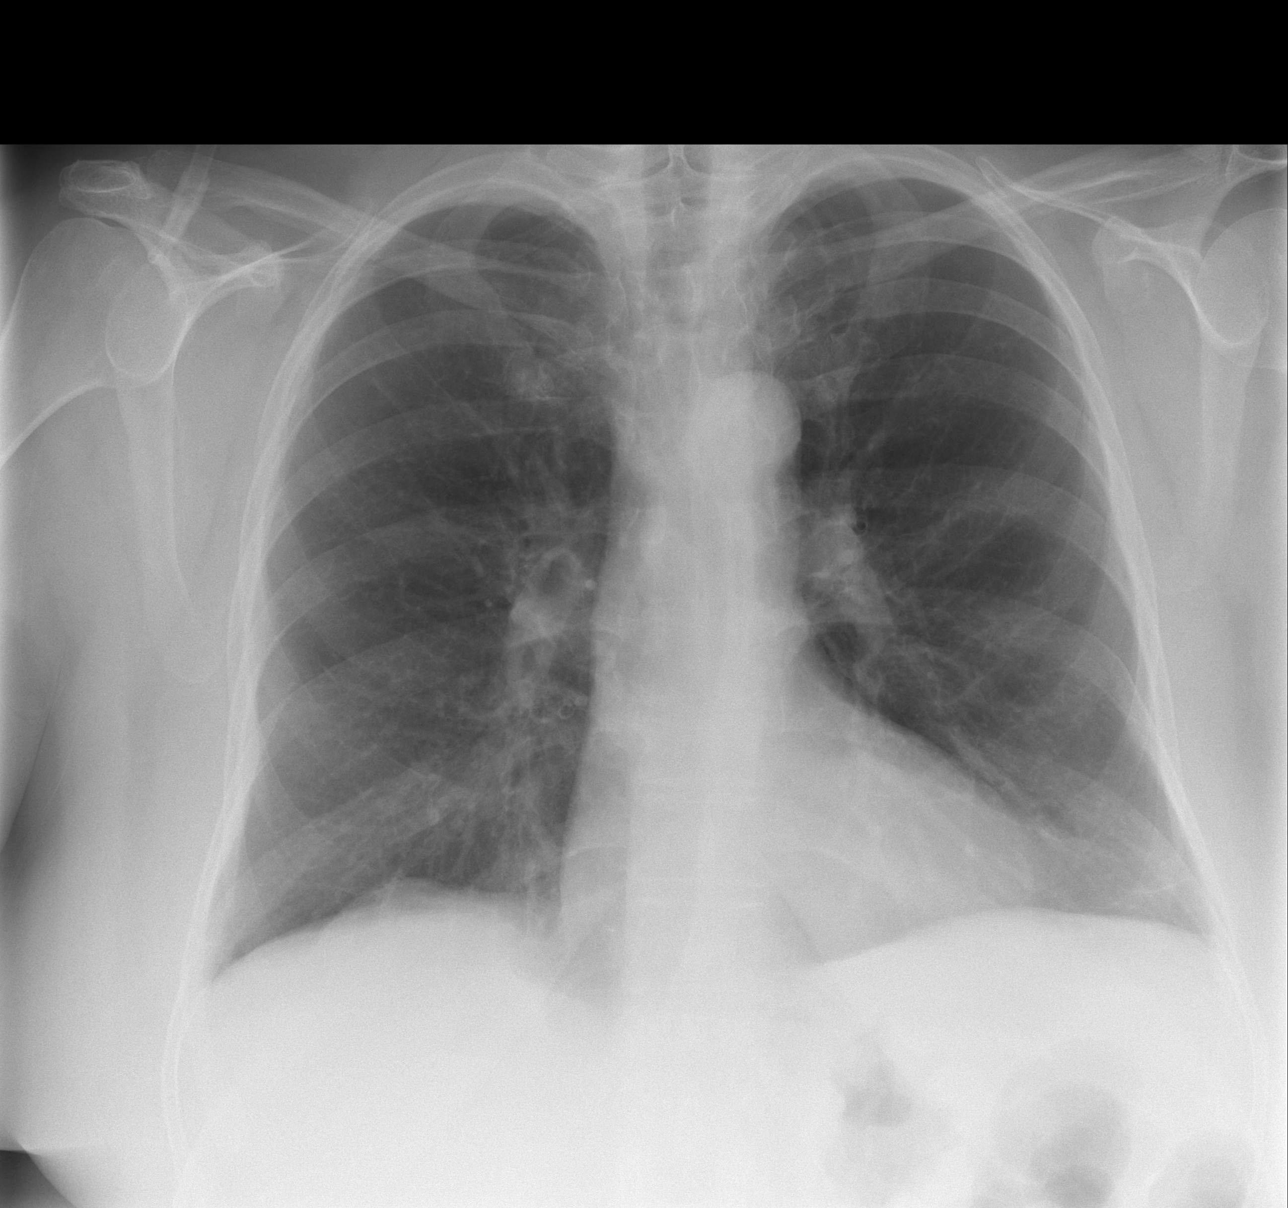

[w abdomen upright *]
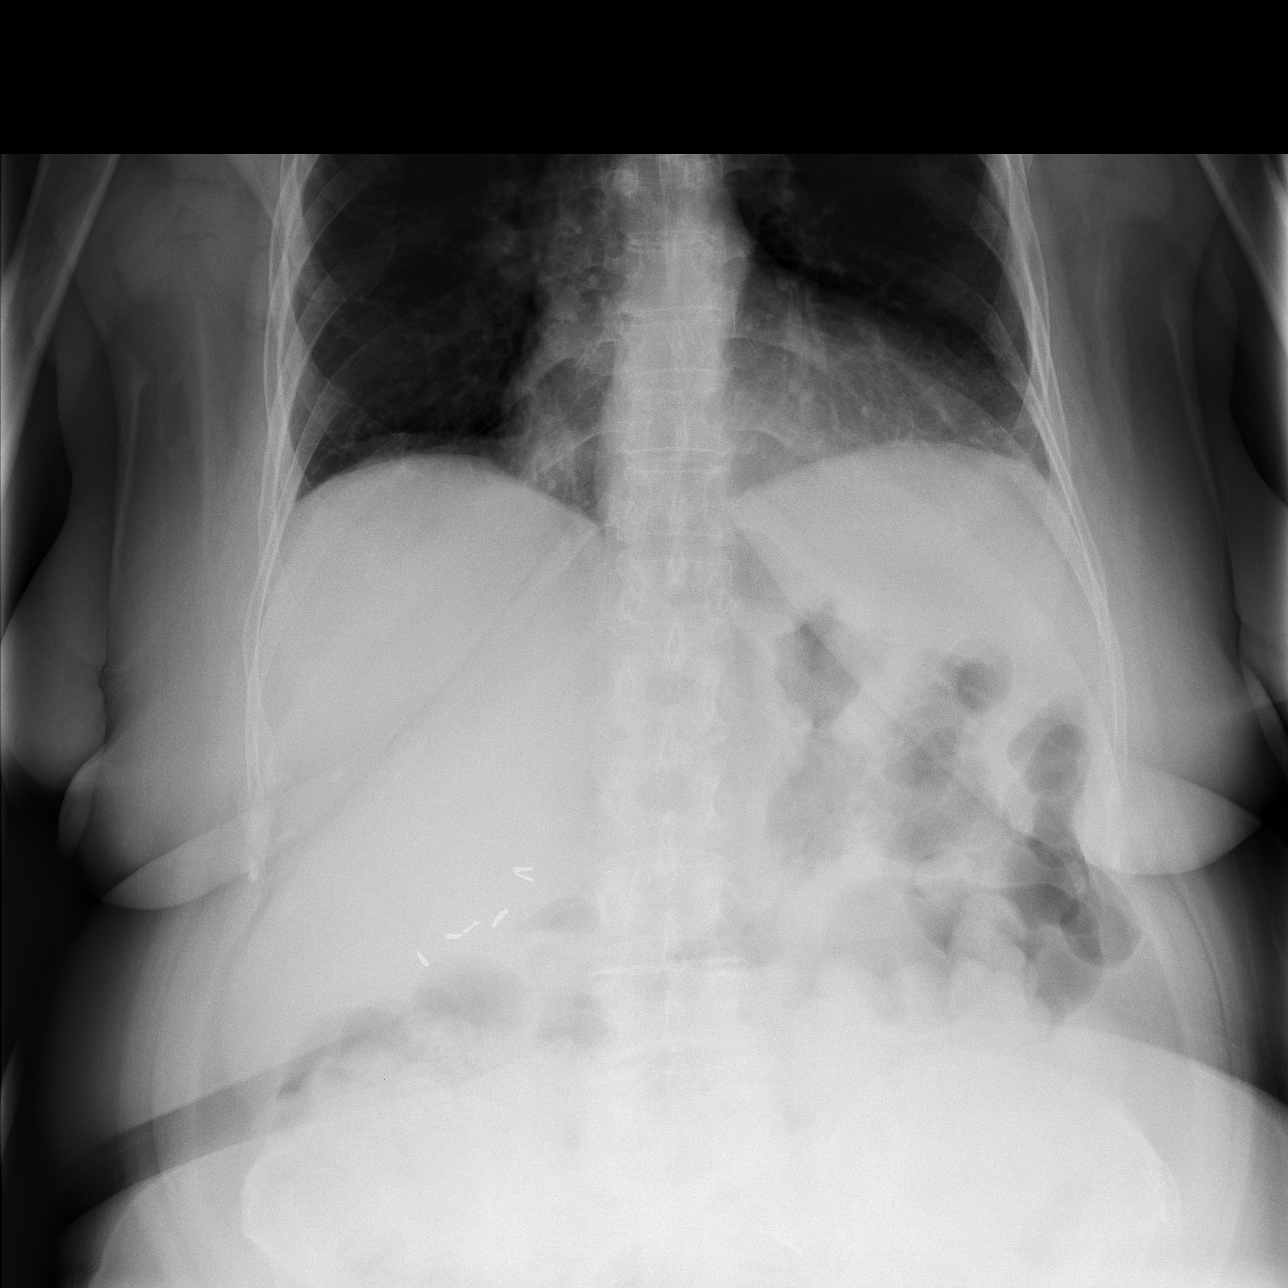

[t abdomen supine]
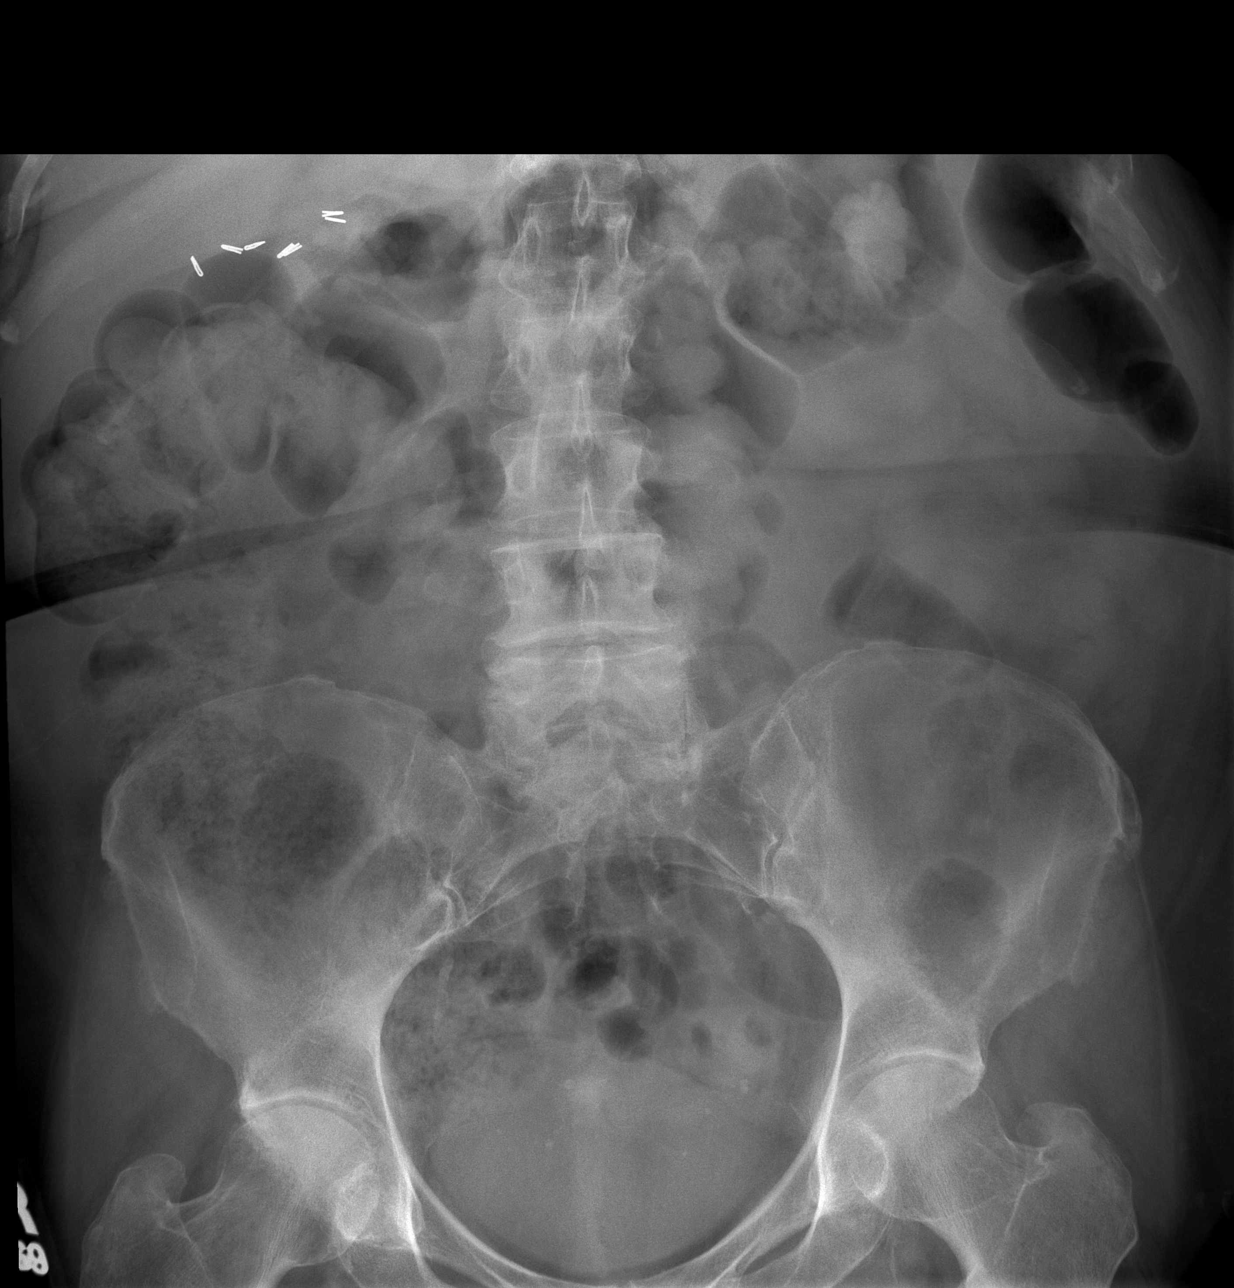

[3 of 3 positions shown; findings below may reference images not displayed]

FINDINGS: Chest x-ray shows no acute abnormality.
 Flat and upright views of the abdomen reveal retained stool in the right colon.   The bowel is not dilated.   There is no free air.   There are clips in the right upper quadrant.
IMPRESSION: Constipation without bowel obstruction.

## 2005-12-14 ENCOUNTER — Emergency Department (HOSPITAL_COMMUNITY): Admission: EM | Admit: 2005-12-14 | Discharge: 2005-12-14 | Payer: Self-pay | Admitting: Emergency Medicine

## 2005-12-14 IMAGING — CR DG ABDOMEN 1V
1 series · 1 of 1 positions shown · non-contrast
Comparison: none

CLINICAL DATA: Abdominal pain, constipation.  
 ABDOMEN - 1 VIEW:

[t abdomen supine]
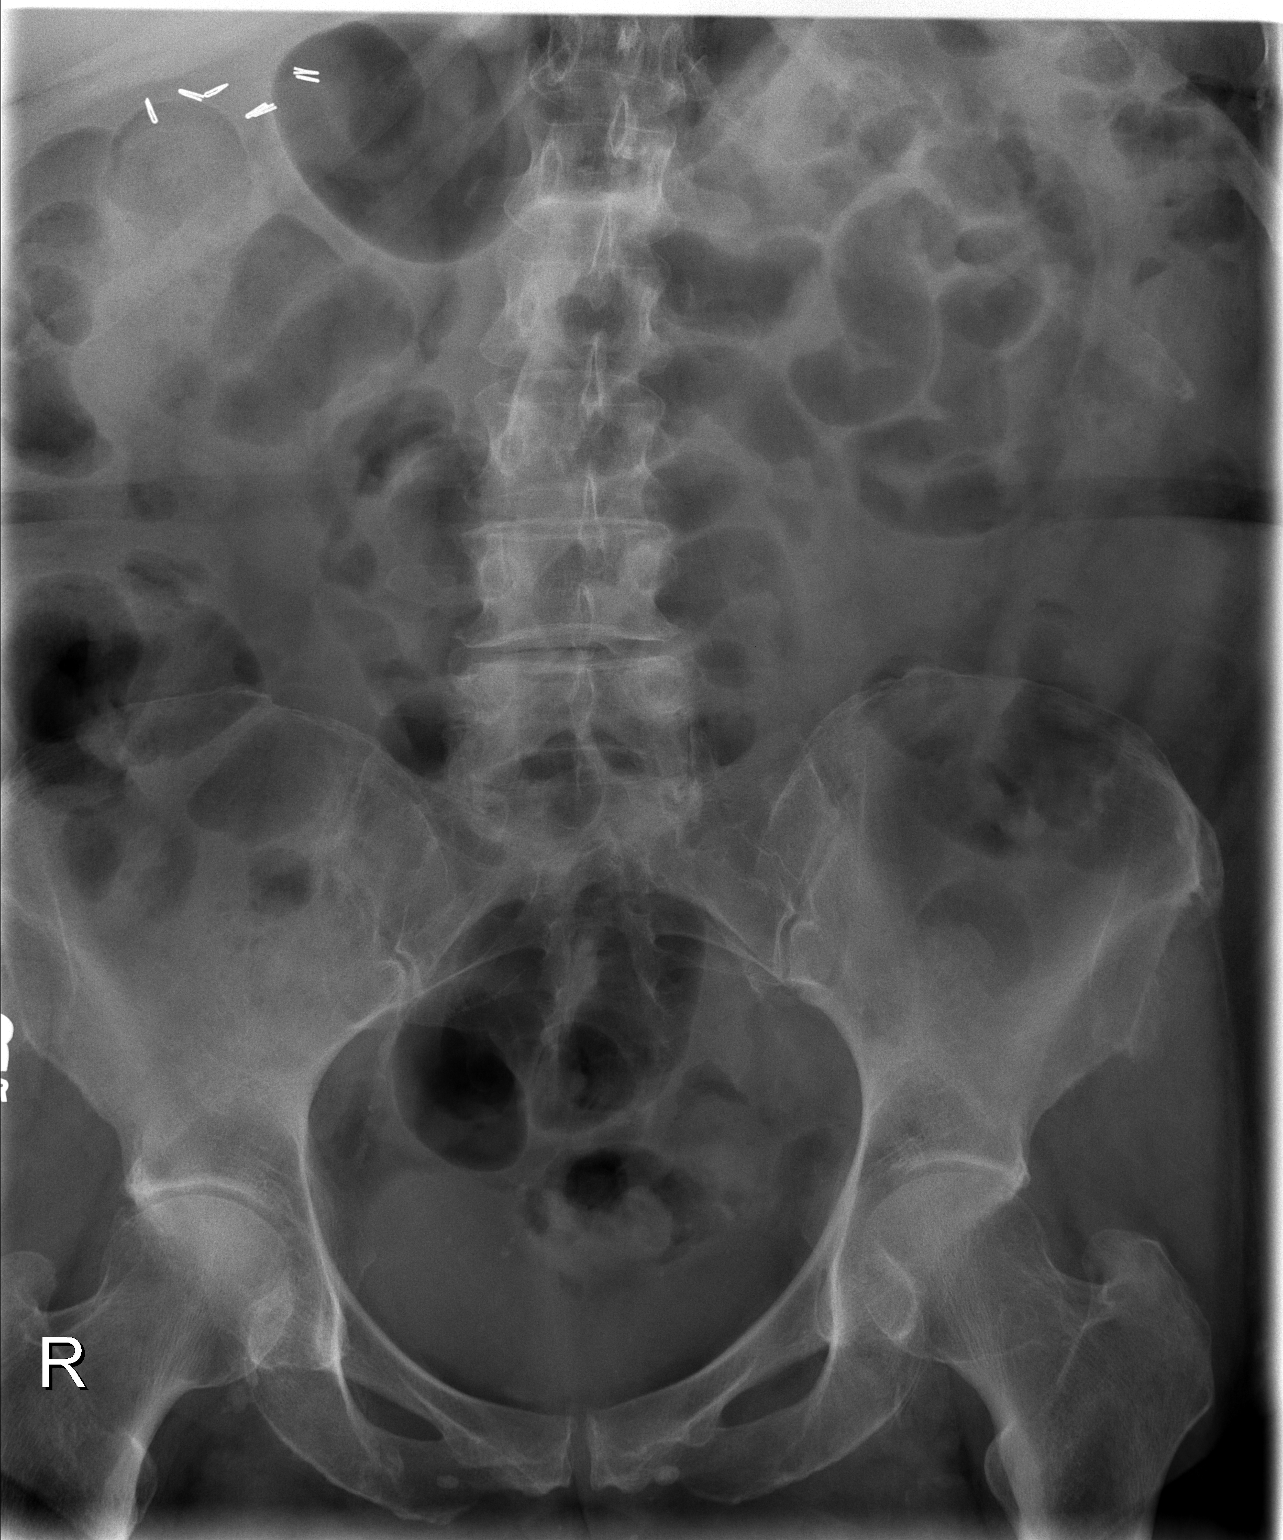

[1 of 1 positions shown; findings below may reference images not displayed]

FINDINGS: There is gas in nondistended colon and small bowel and a small amount of gas in the stomach.  There is no definite evidence of bowel obstruction but this is a nonspecific bowel gas pattern.  Evidence of cholecystectomy noted.   Degenerative changes in the lower lumbar spine present.
IMPRESSION: Nonspecific bowel gas pattern with nondistended gas-filled colon and small bowel.

## 2005-12-15 IMAGING — CR DG ABDOMEN 1V
2 series · 2 of 2 positions shown · non-contrast
Comparison: none

CLINICAL DATA: Constipation.  Distended abdomen.
 ABDOMEN - 1 VIEW:
 There are surgical clips in the right upper quadrant post cholecystectomy.  The patient continues to have a moderate amount of stool in the right colon.  The small bowel gas pattern is normal.  No worrisome calcifications or bony findings.

[view not recorded (1 of 2)]
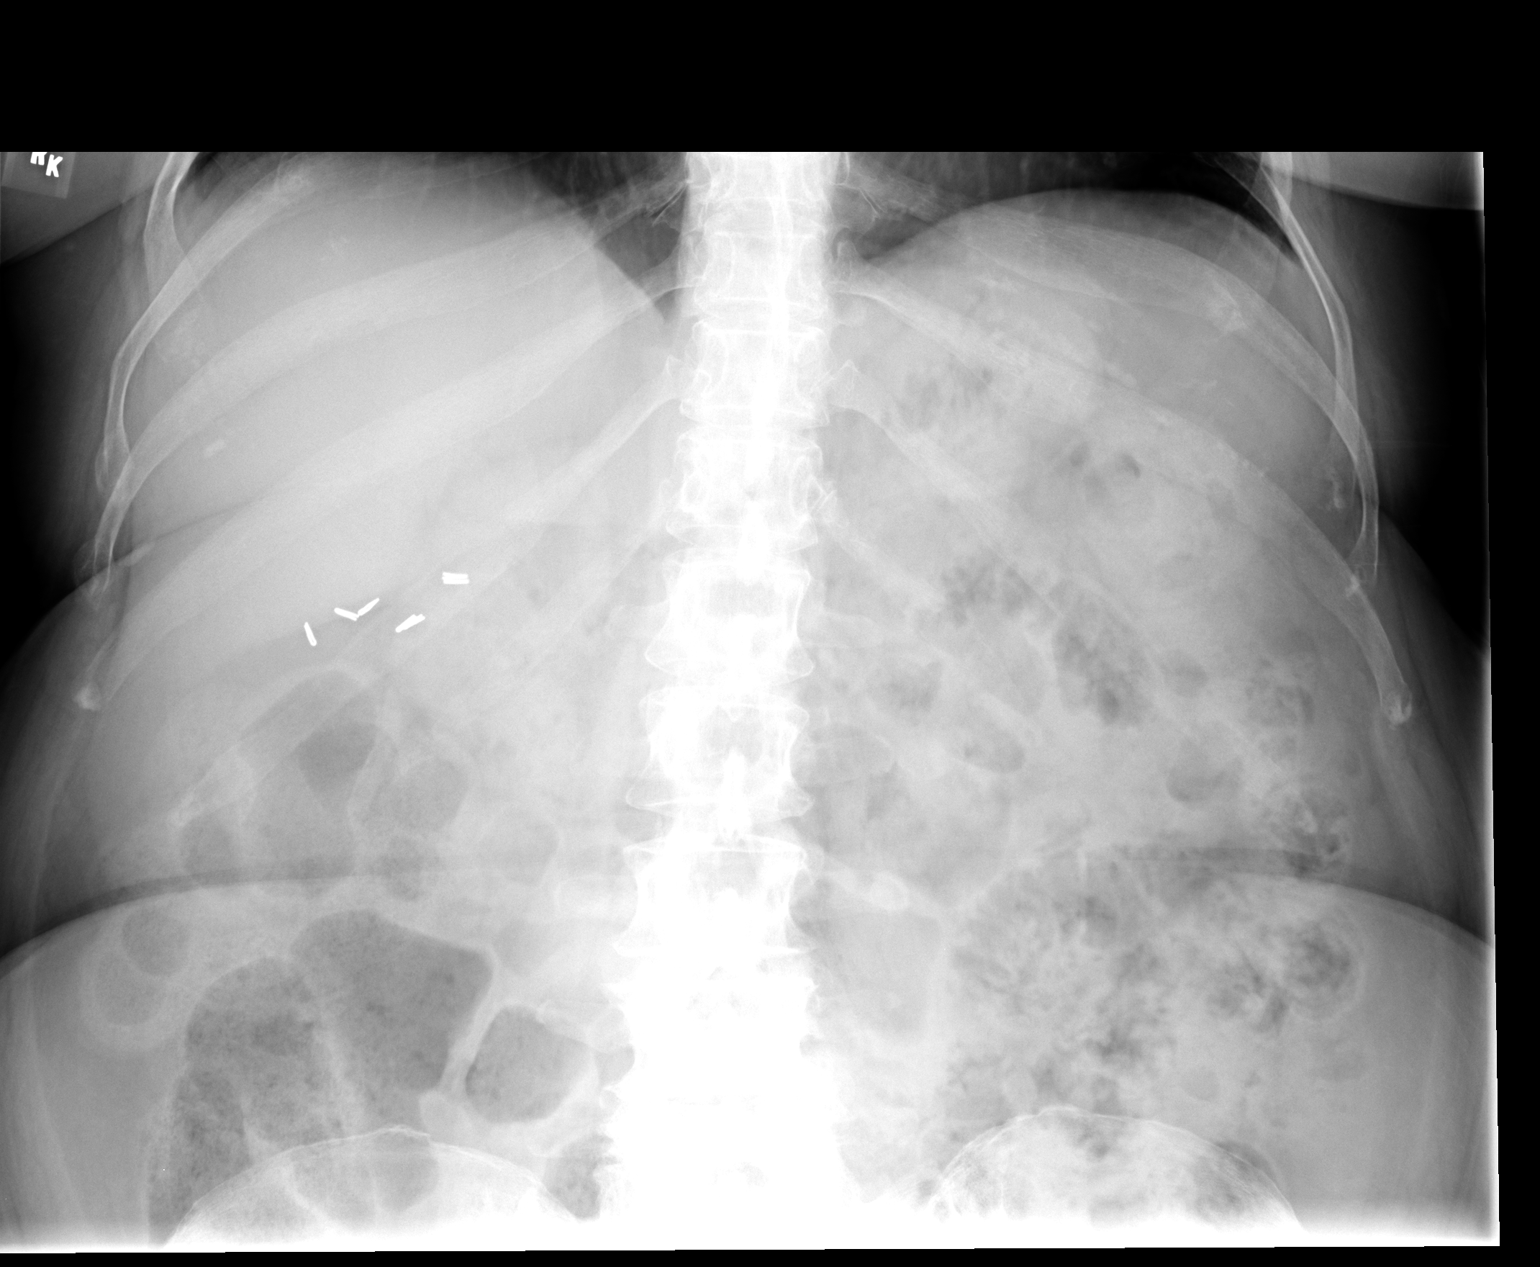

[view not recorded (2 of 2)]
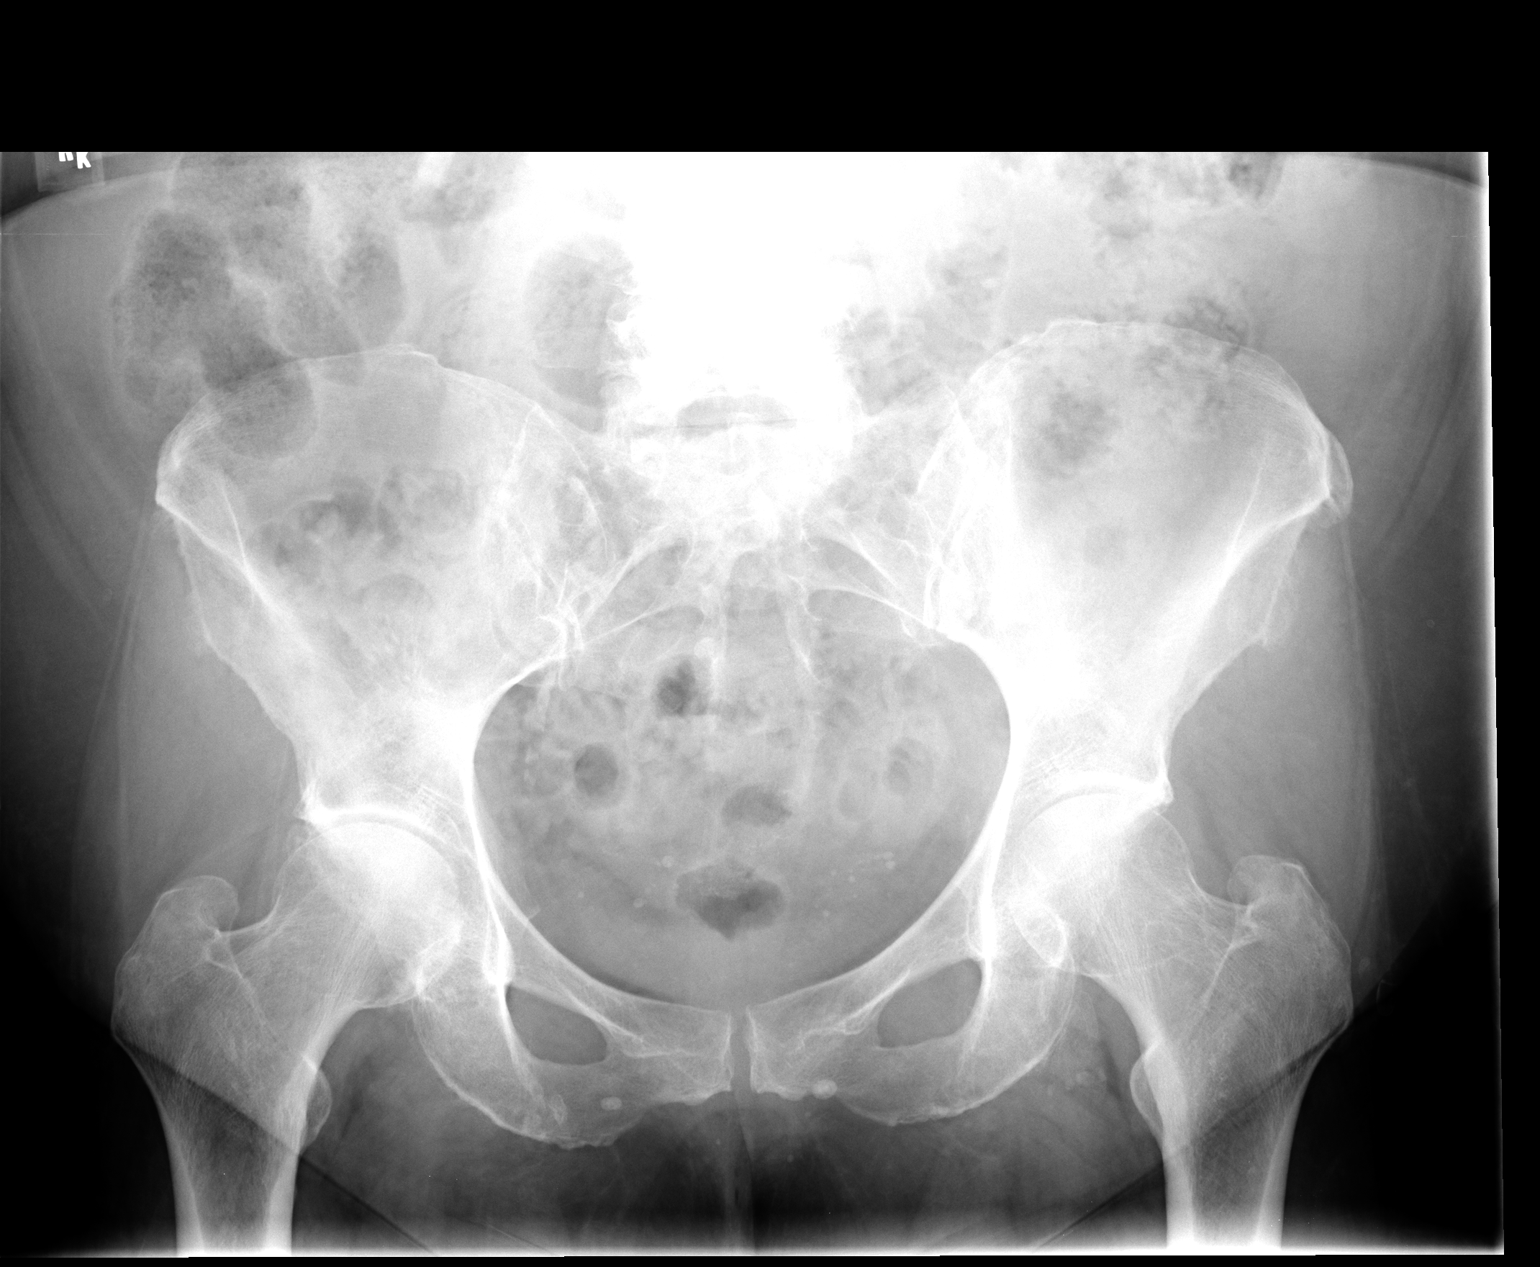

[2 of 2 positions shown; findings below may reference images not displayed]

IMPRESSION: Moderate amount of stool remains in the right colon.

## 2005-12-16 ENCOUNTER — Ambulatory Visit: Payer: Self-pay | Admitting: Sports Medicine

## 2005-12-16 ENCOUNTER — Emergency Department (HOSPITAL_COMMUNITY): Admission: EM | Admit: 2005-12-16 | Discharge: 2005-12-16 | Payer: Self-pay | Admitting: Emergency Medicine

## 2005-12-21 ENCOUNTER — Emergency Department (HOSPITAL_COMMUNITY): Admission: EM | Admit: 2005-12-21 | Discharge: 2005-12-22 | Payer: Self-pay | Admitting: Emergency Medicine

## 2005-12-22 IMAGING — CR DG ABDOMEN ACUTE W/ 1V CHEST
3 series · 3 of 3 positions shown · non-contrast
Comparison: 11/12/05.

CLINICAL DATA: vomiting, diarrhea, lower abdominal pain and nausea.
 ACUTE ABDOMINAL SERIES:

[w chest pa]
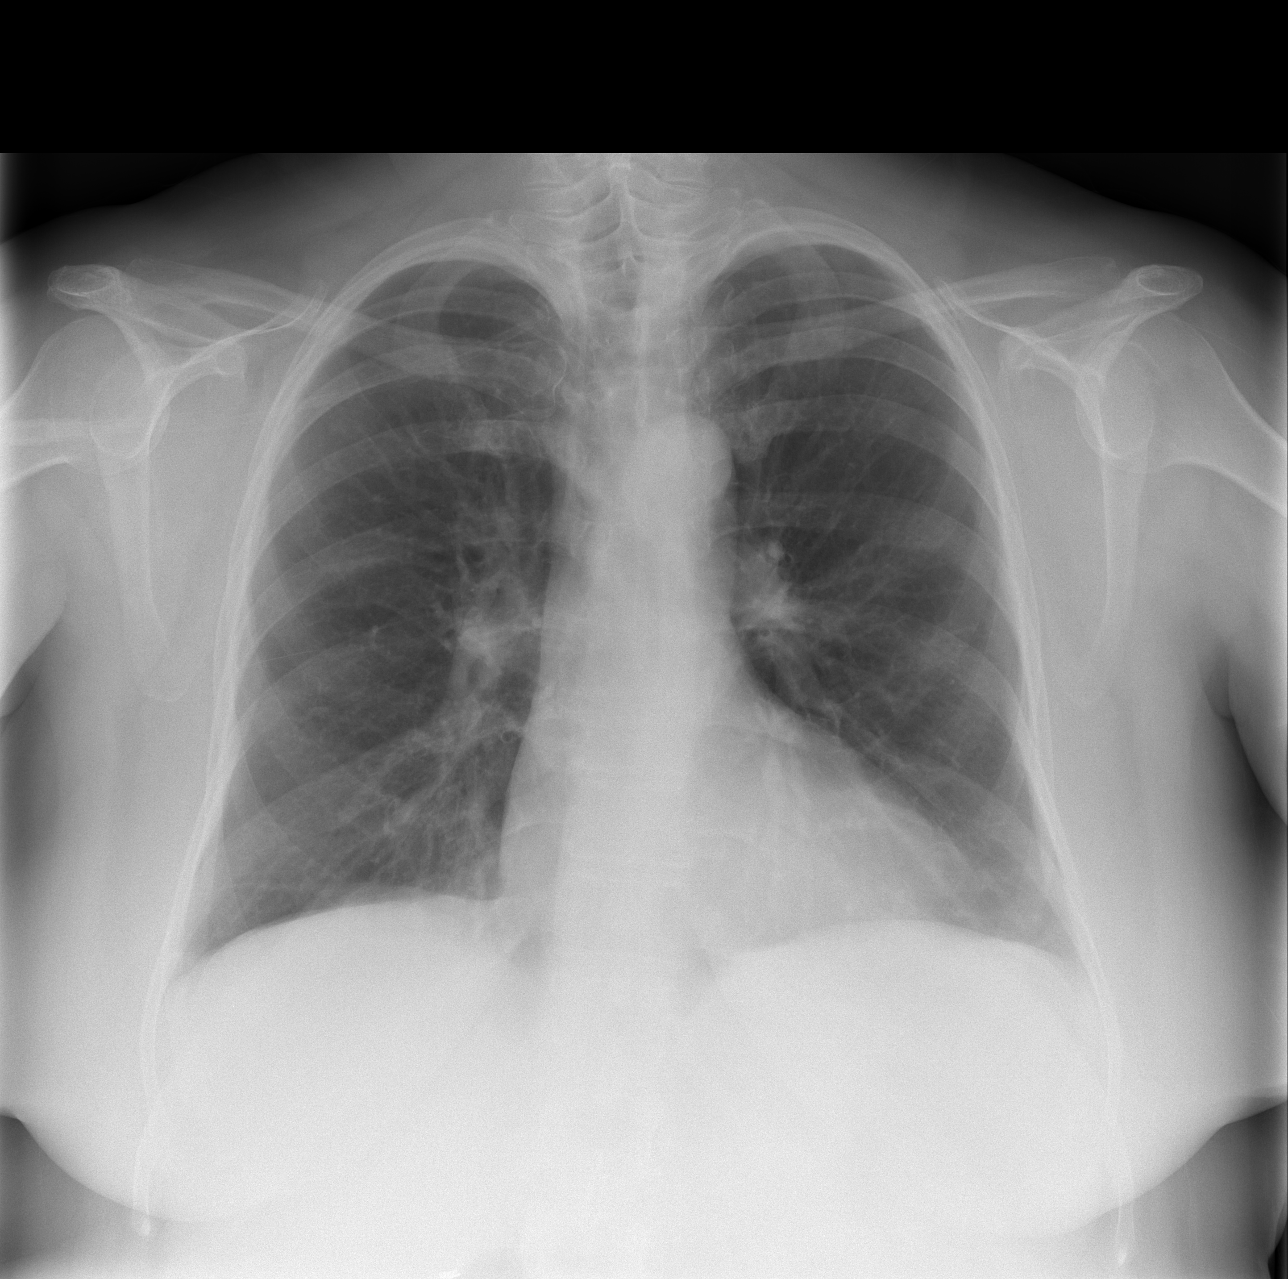

[w abdomen upright *]
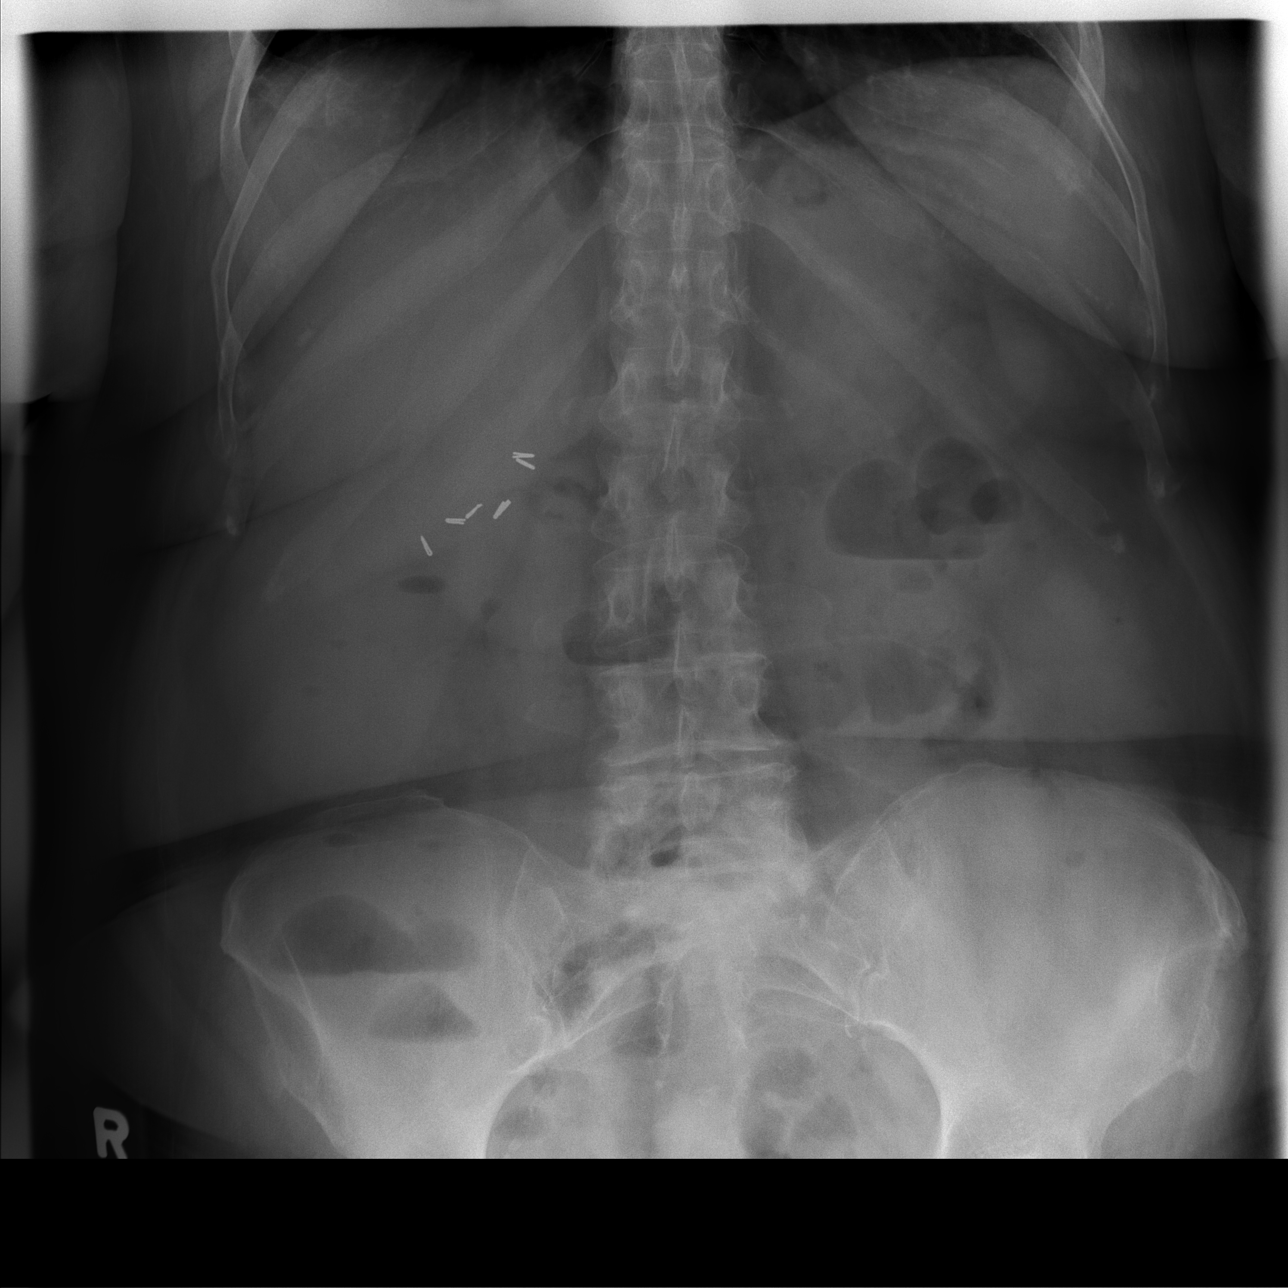

[t abdomen supine]
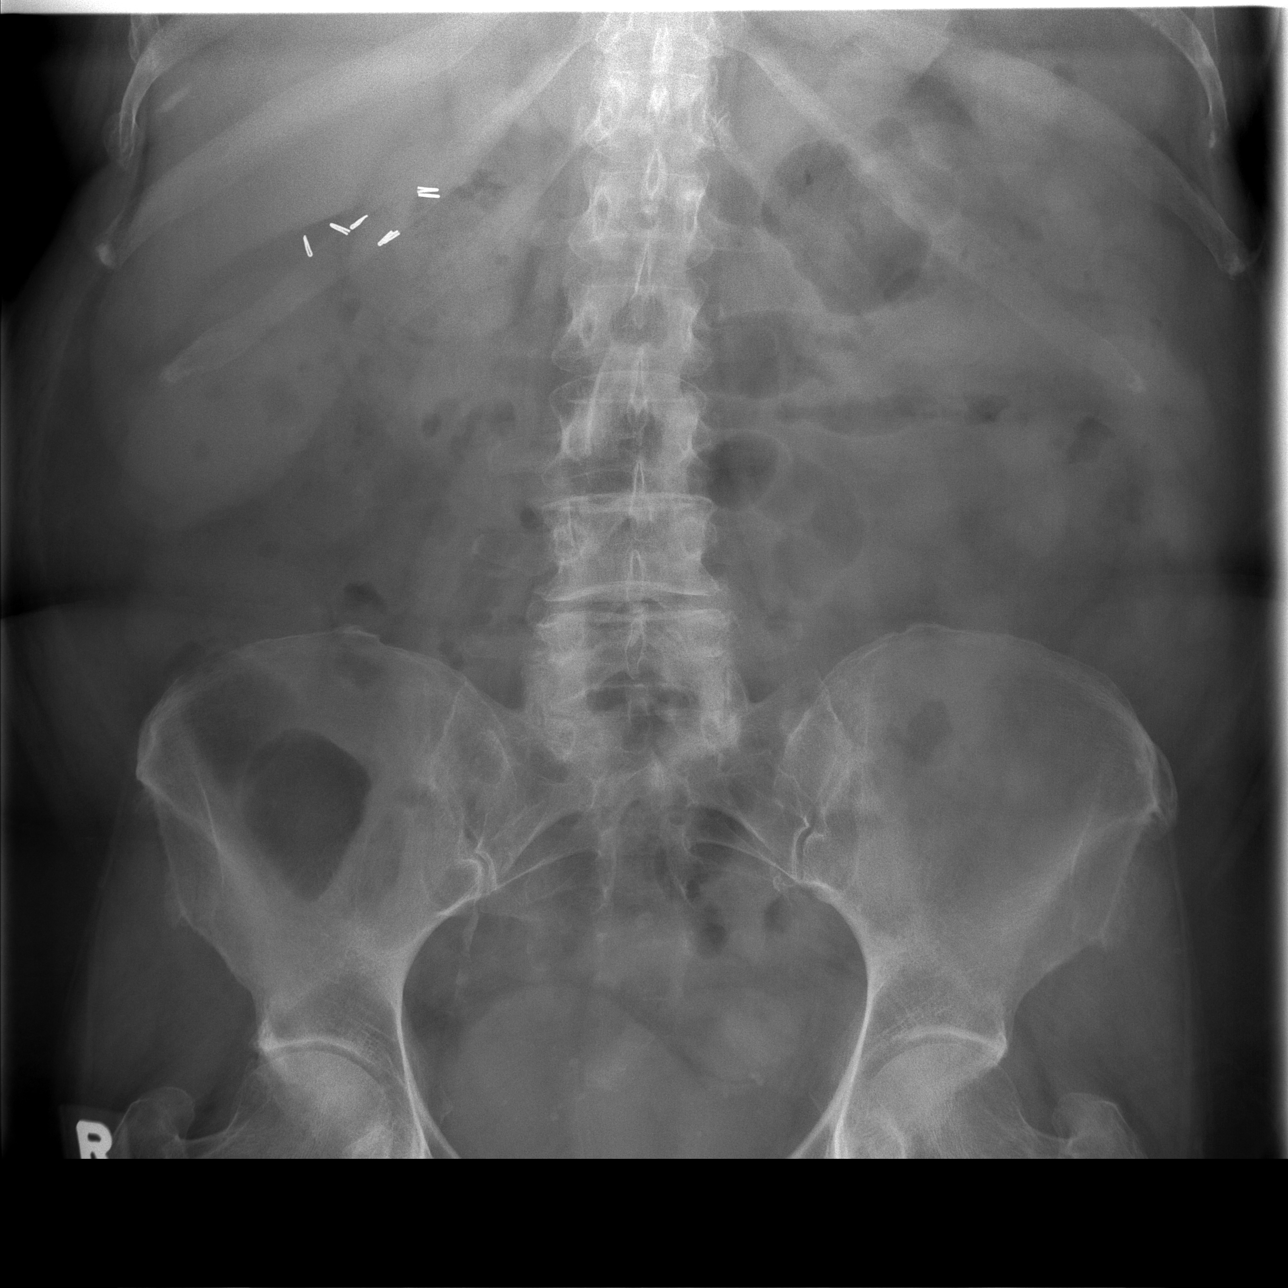

[3 of 3 positions shown; findings below may reference images not displayed]

FINDINGS: The lungs appear clear. Heart and mediastinum appear unremarkable.  
 No free intraperitoneal gas is identified.  There are some scattered air fluid levels in nondilated small and large bowel suggesting diffuse mild ileus.  Correlate with bowel auscultation. There is a paucity of stool in the colon.
IMPRESSION: Scattered small air fluid levels in nondilated small bowel suggesting gastroenteritis or mild ileus.

## 2005-12-23 ENCOUNTER — Encounter: Payer: Self-pay | Admitting: Emergency Medicine

## 2005-12-24 ENCOUNTER — Ambulatory Visit: Payer: Self-pay | Admitting: Family Medicine

## 2005-12-24 ENCOUNTER — Inpatient Hospital Stay (HOSPITAL_COMMUNITY): Admission: EM | Admit: 2005-12-24 | Discharge: 2005-12-27 | Payer: Self-pay | Admitting: Family Medicine

## 2005-12-24 IMAGING — CR DG CHEST 2V
2 series · 2 of 2 positions shown · non-contrast
Comparison: none

CLINICAL DATA: Shortness of breath, cough, emphysema.  
 CHEST - 2 VIEW:

[w chest lat]
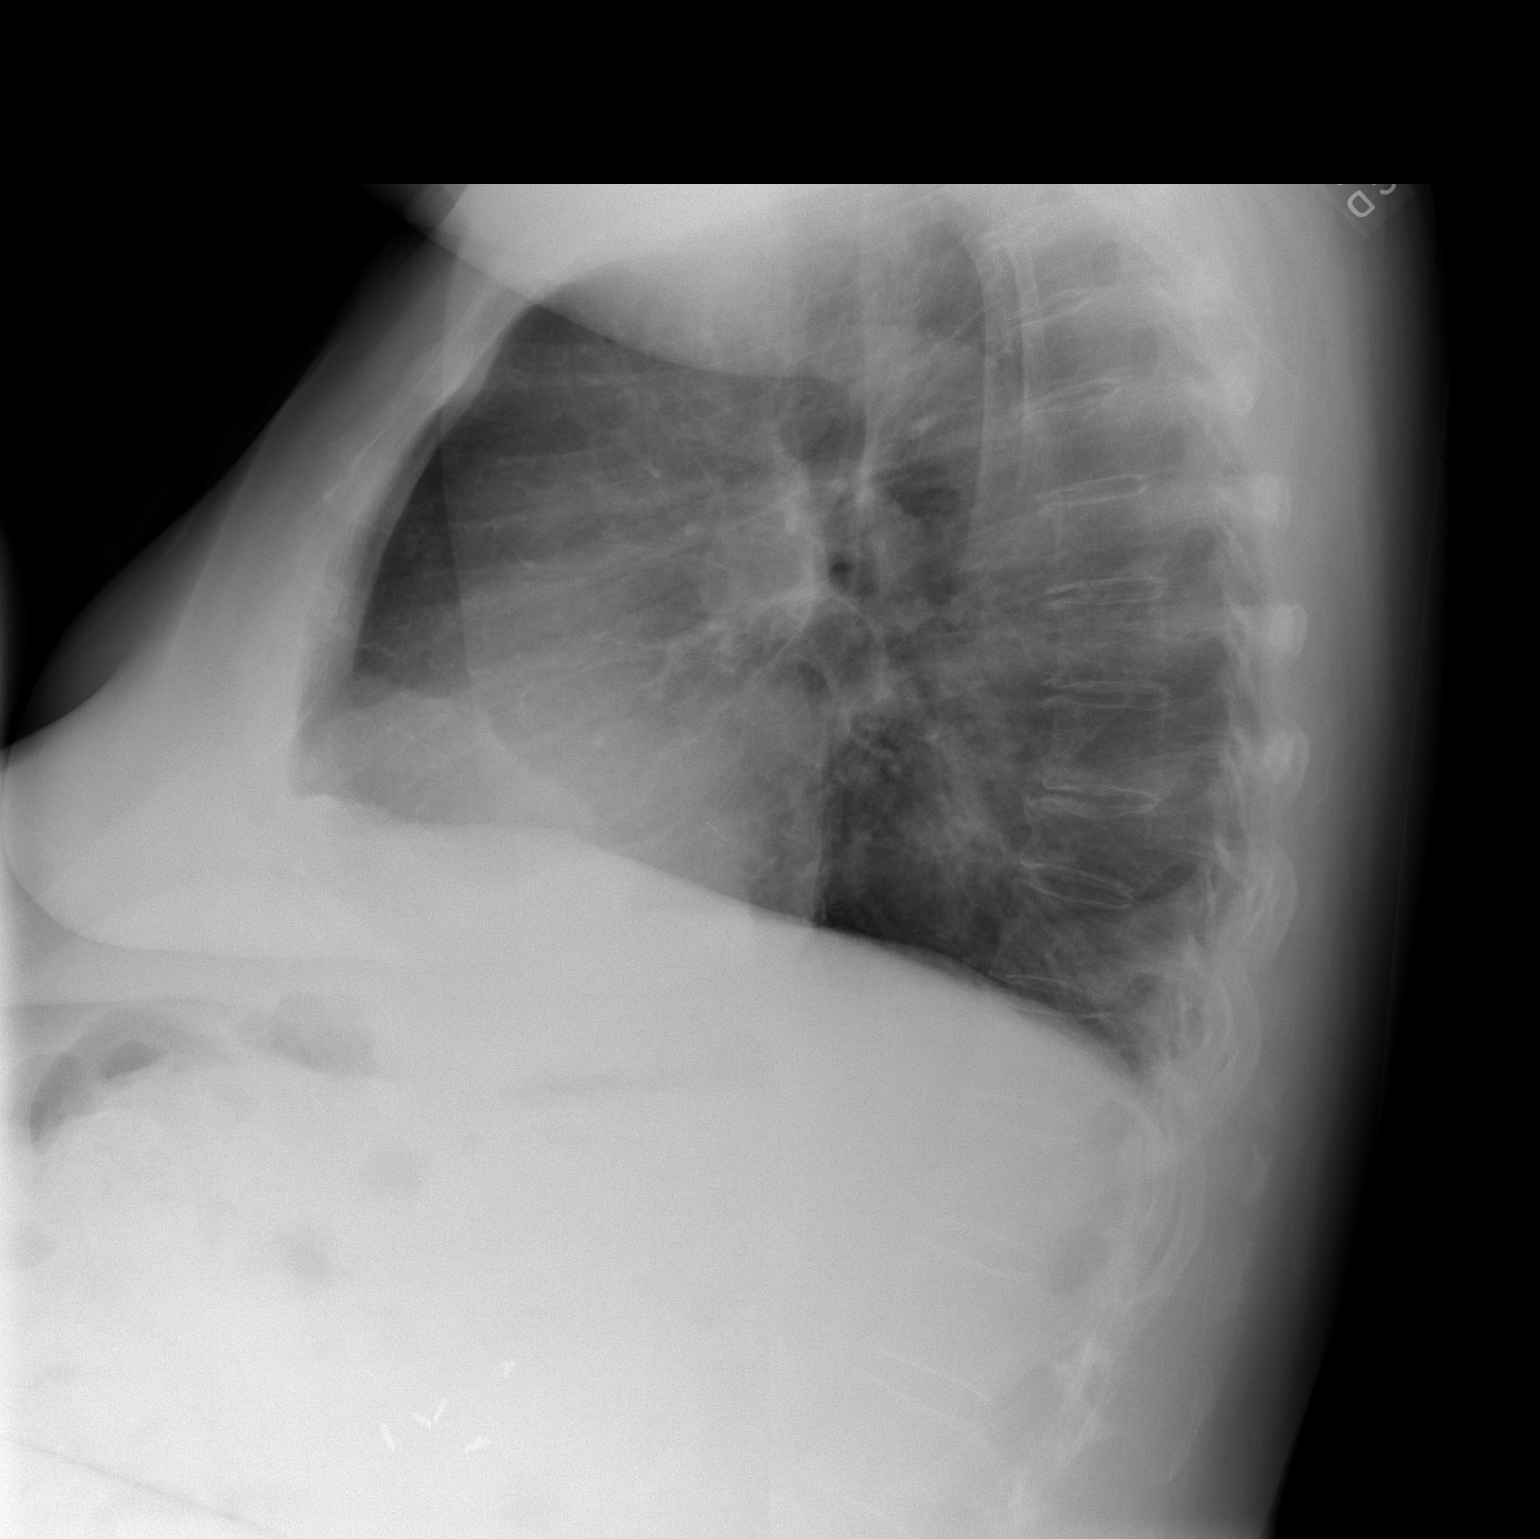

[view not recorded]
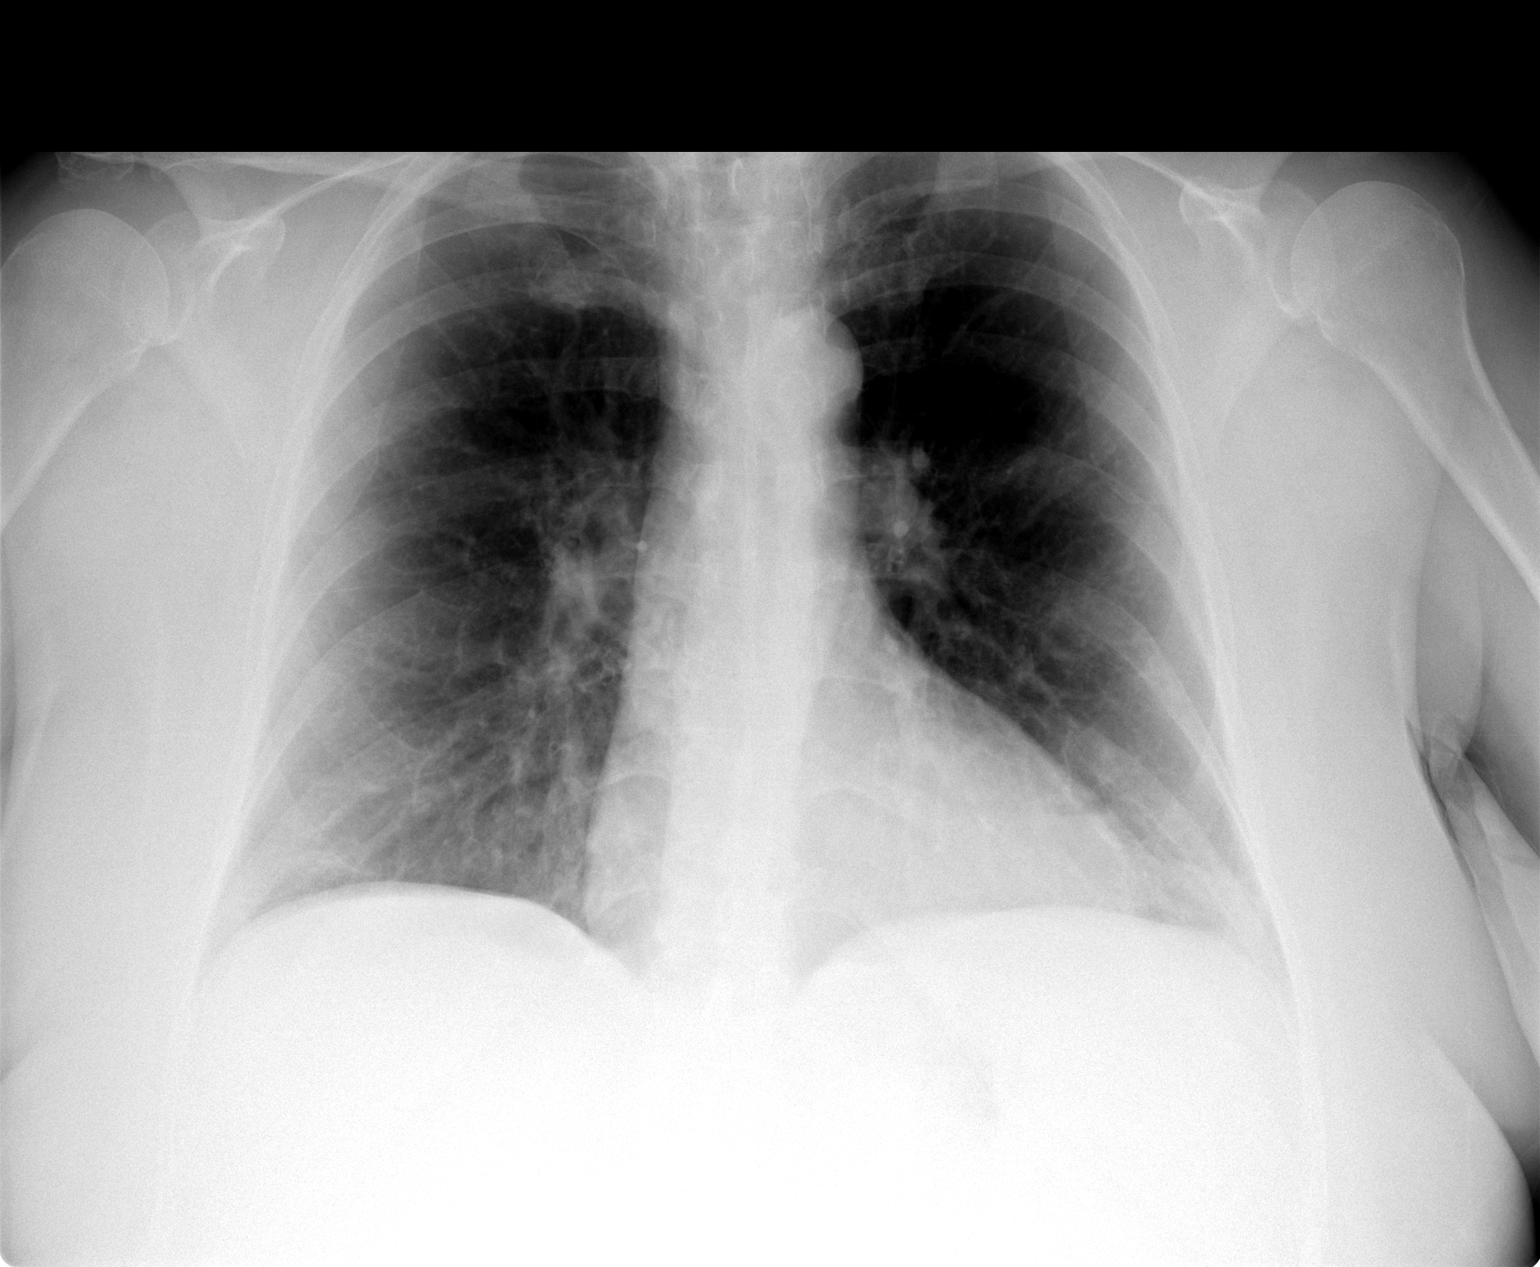

[2 of 2 positions shown; findings below may reference images not displayed]

FINDINGS: Moderate hyperinflation.  Cardiomegaly with clear lung fields.  No infiltrates or edema.  Comparison from 11/13/05 reveals no interval change and no acute findings.  The bones appear unremarkable.
IMPRESSION: COPD, cardiomegaly, and no active cardiopulmonary disease.

## 2005-12-27 IMAGING — CR DG ABDOMEN ACUTE W/ 1V CHEST
4 series · 4 of 4 positions shown · non-contrast
Comparison: 11/19/05.   
 CHEST:

CLINICAL DATA: Constipation.  Nausea.  Fever. 
 ACUTE ABDOMINAL SERIES:

[view not recorded (1 of 4)]
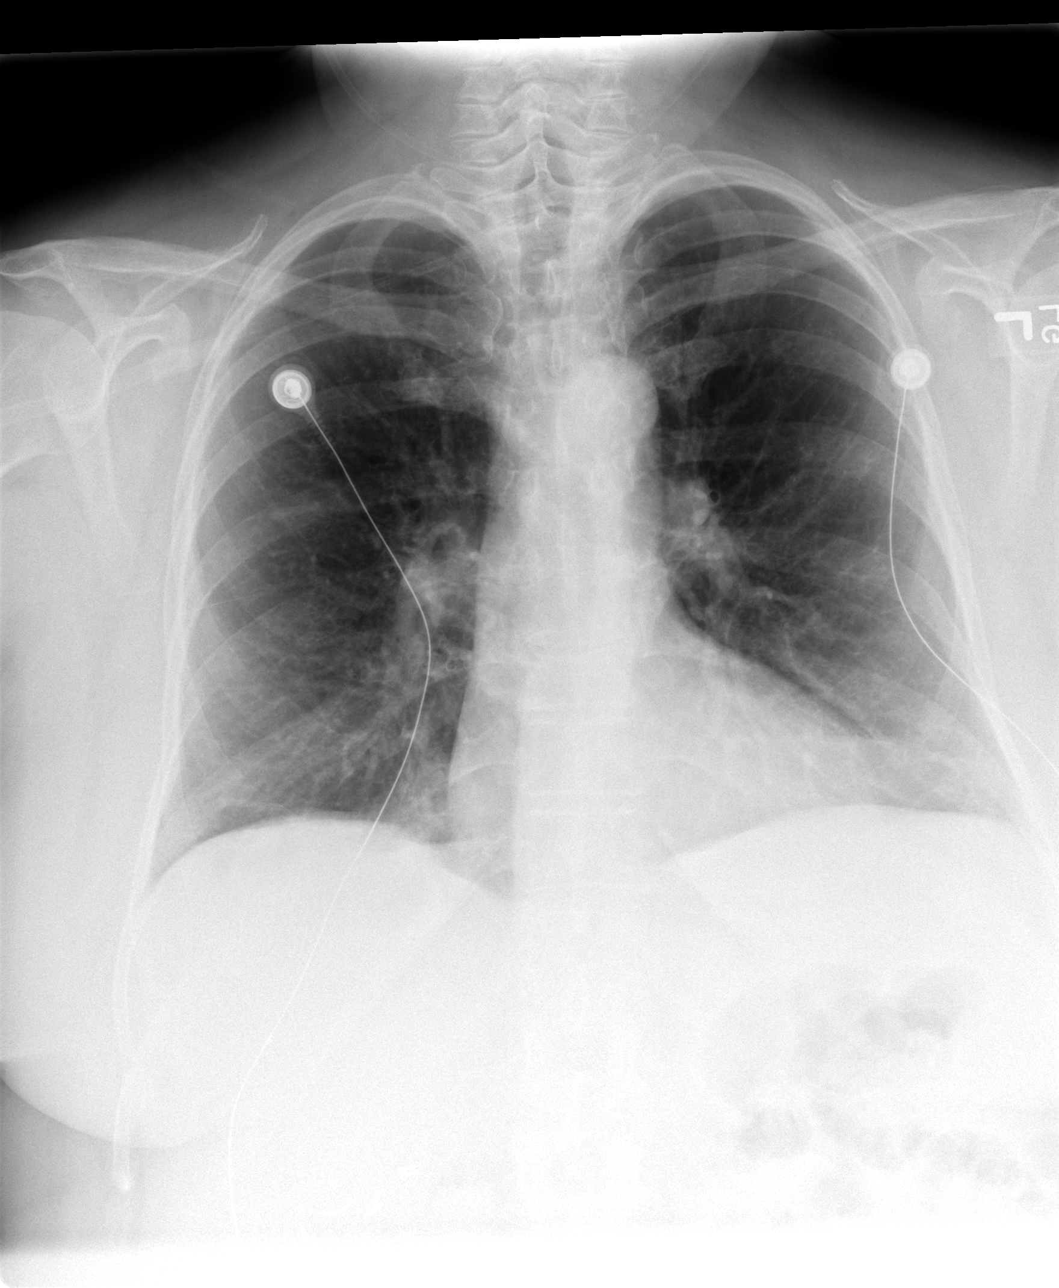

[view not recorded (2 of 4)]
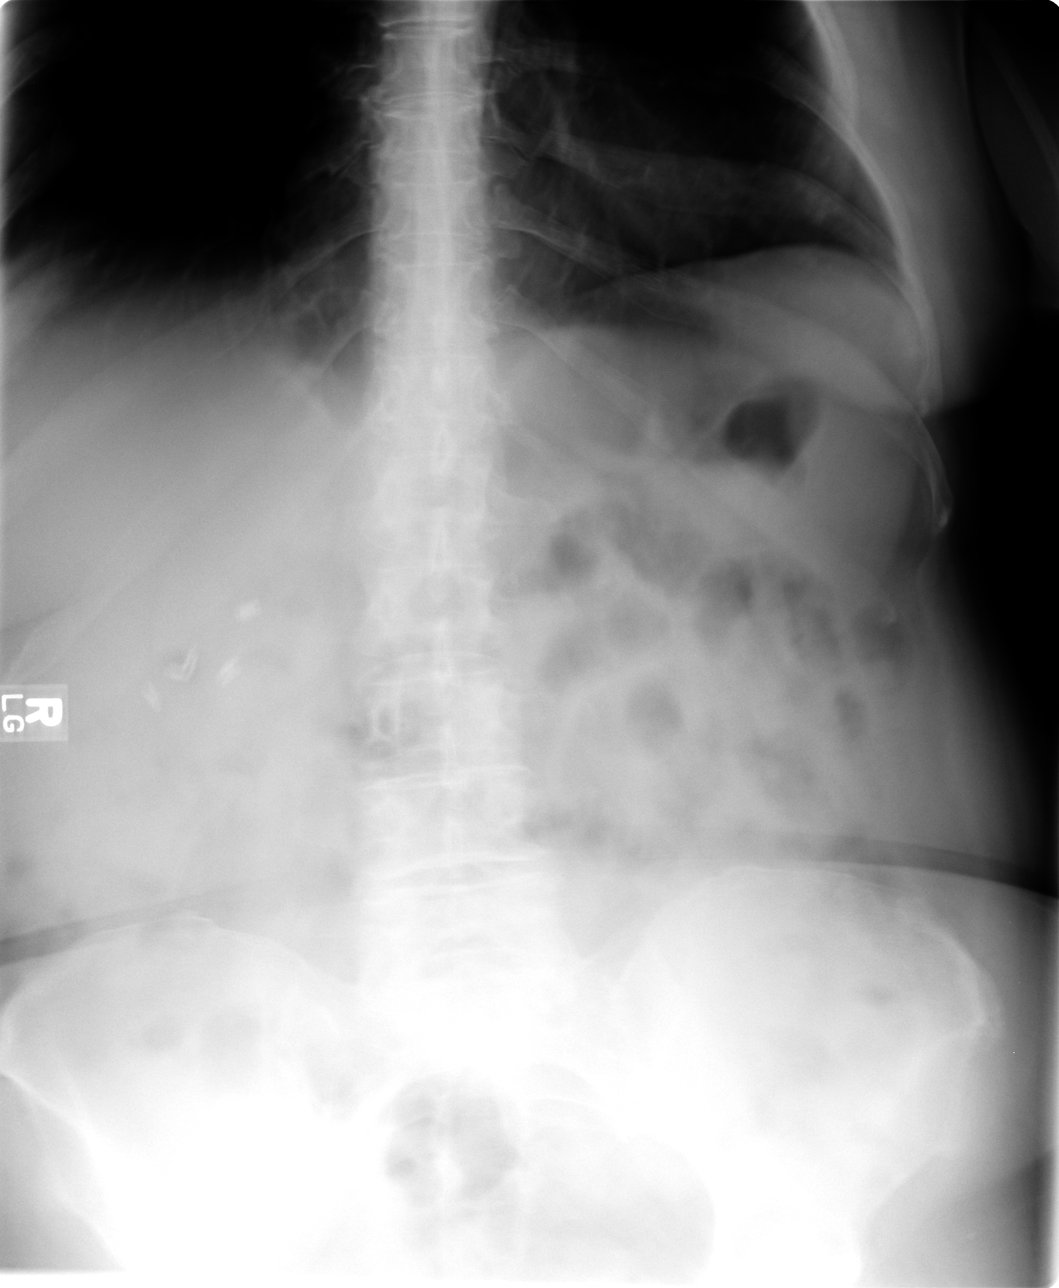

[view not recorded (3 of 4)]
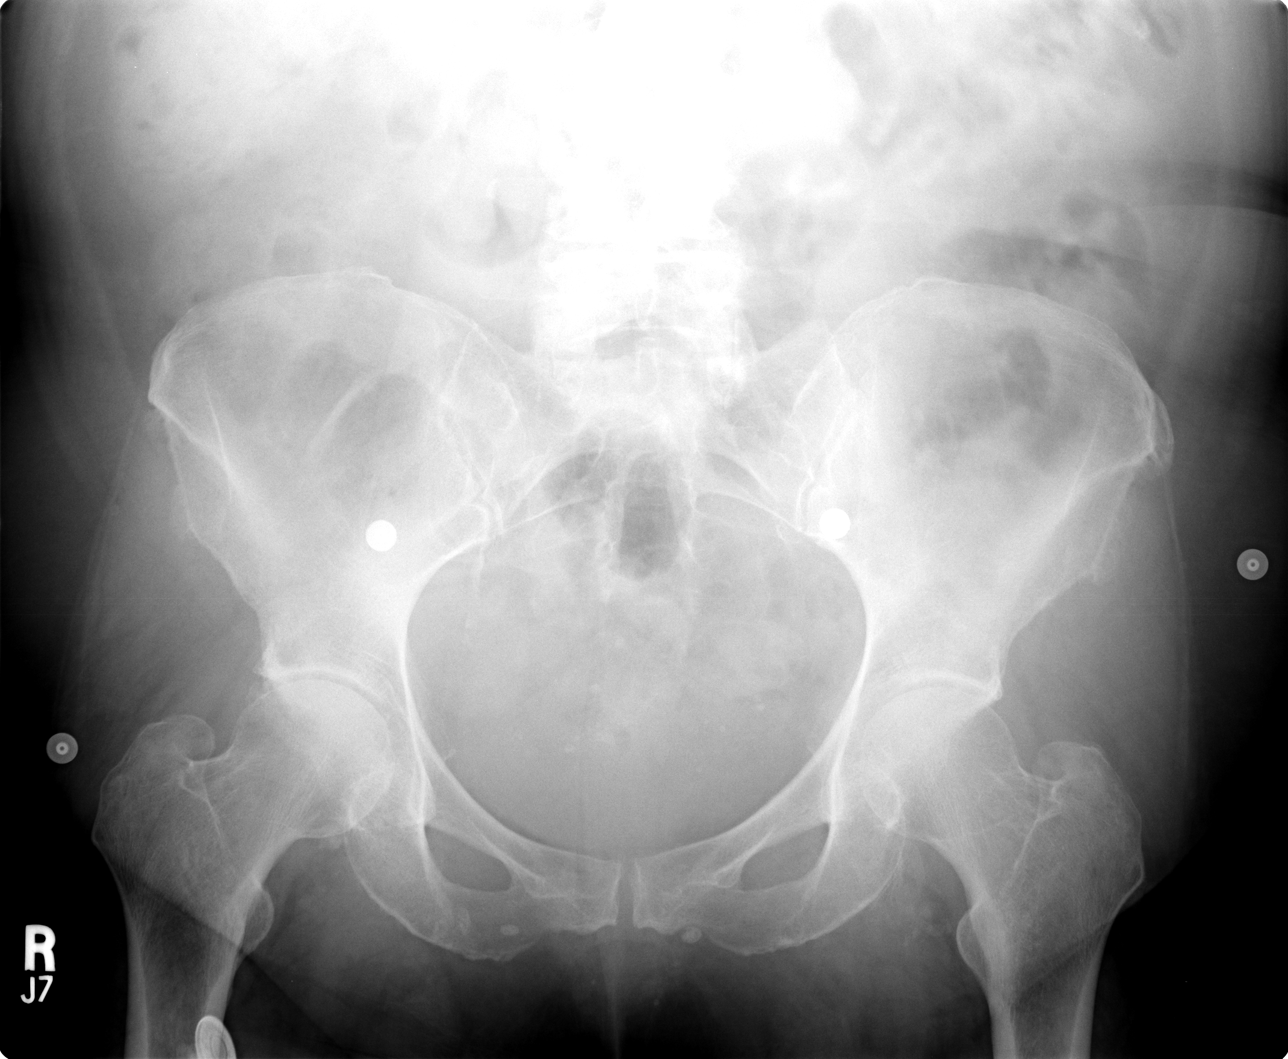

[view not recorded (4 of 4)]
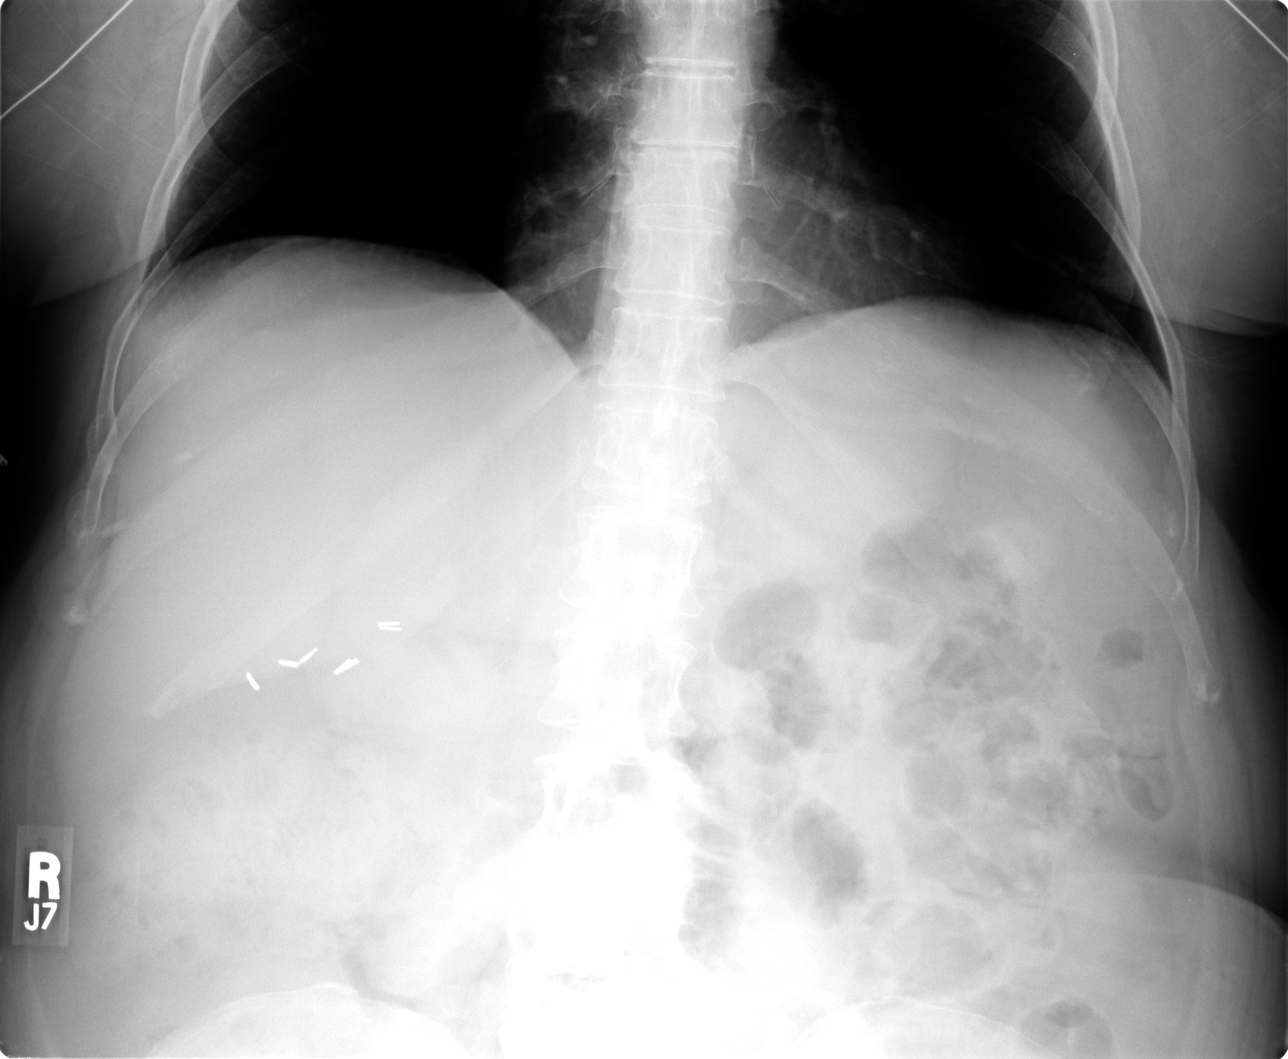

[4 of 4 positions shown; findings below may reference images not displayed]

FINDINGS: A single view of the chest shows the lungs to be clear.  The heart is within normal limits in size. 
 ABDOMEN:
FINDINGS: Supine and erect views of the abdomen are compared to 11/19/05.  No bowel obstruction or free air is seen.  Surgical clips are noted in the right upper quadrant.
IMPRESSION: 1.  No active lung disease.
 2.  No obstruction or free air.

## 2005-12-30 ENCOUNTER — Ambulatory Visit: Payer: Self-pay | Admitting: Family Medicine

## 2005-12-30 IMAGING — CR DG FOREARM 2V*L*
2 series · 2 of 2 positions shown · non-contrast
Comparison: None.
COMPARISON: None.
COMPARISON: No T-spine films but there is a lateral chest x-ray from 06/14/05.

CLINICAL DATA: Fell down stairs ? multiple injuries.
 LEFT FOREARM ? 2 VIEW:

[x forearm ap left]
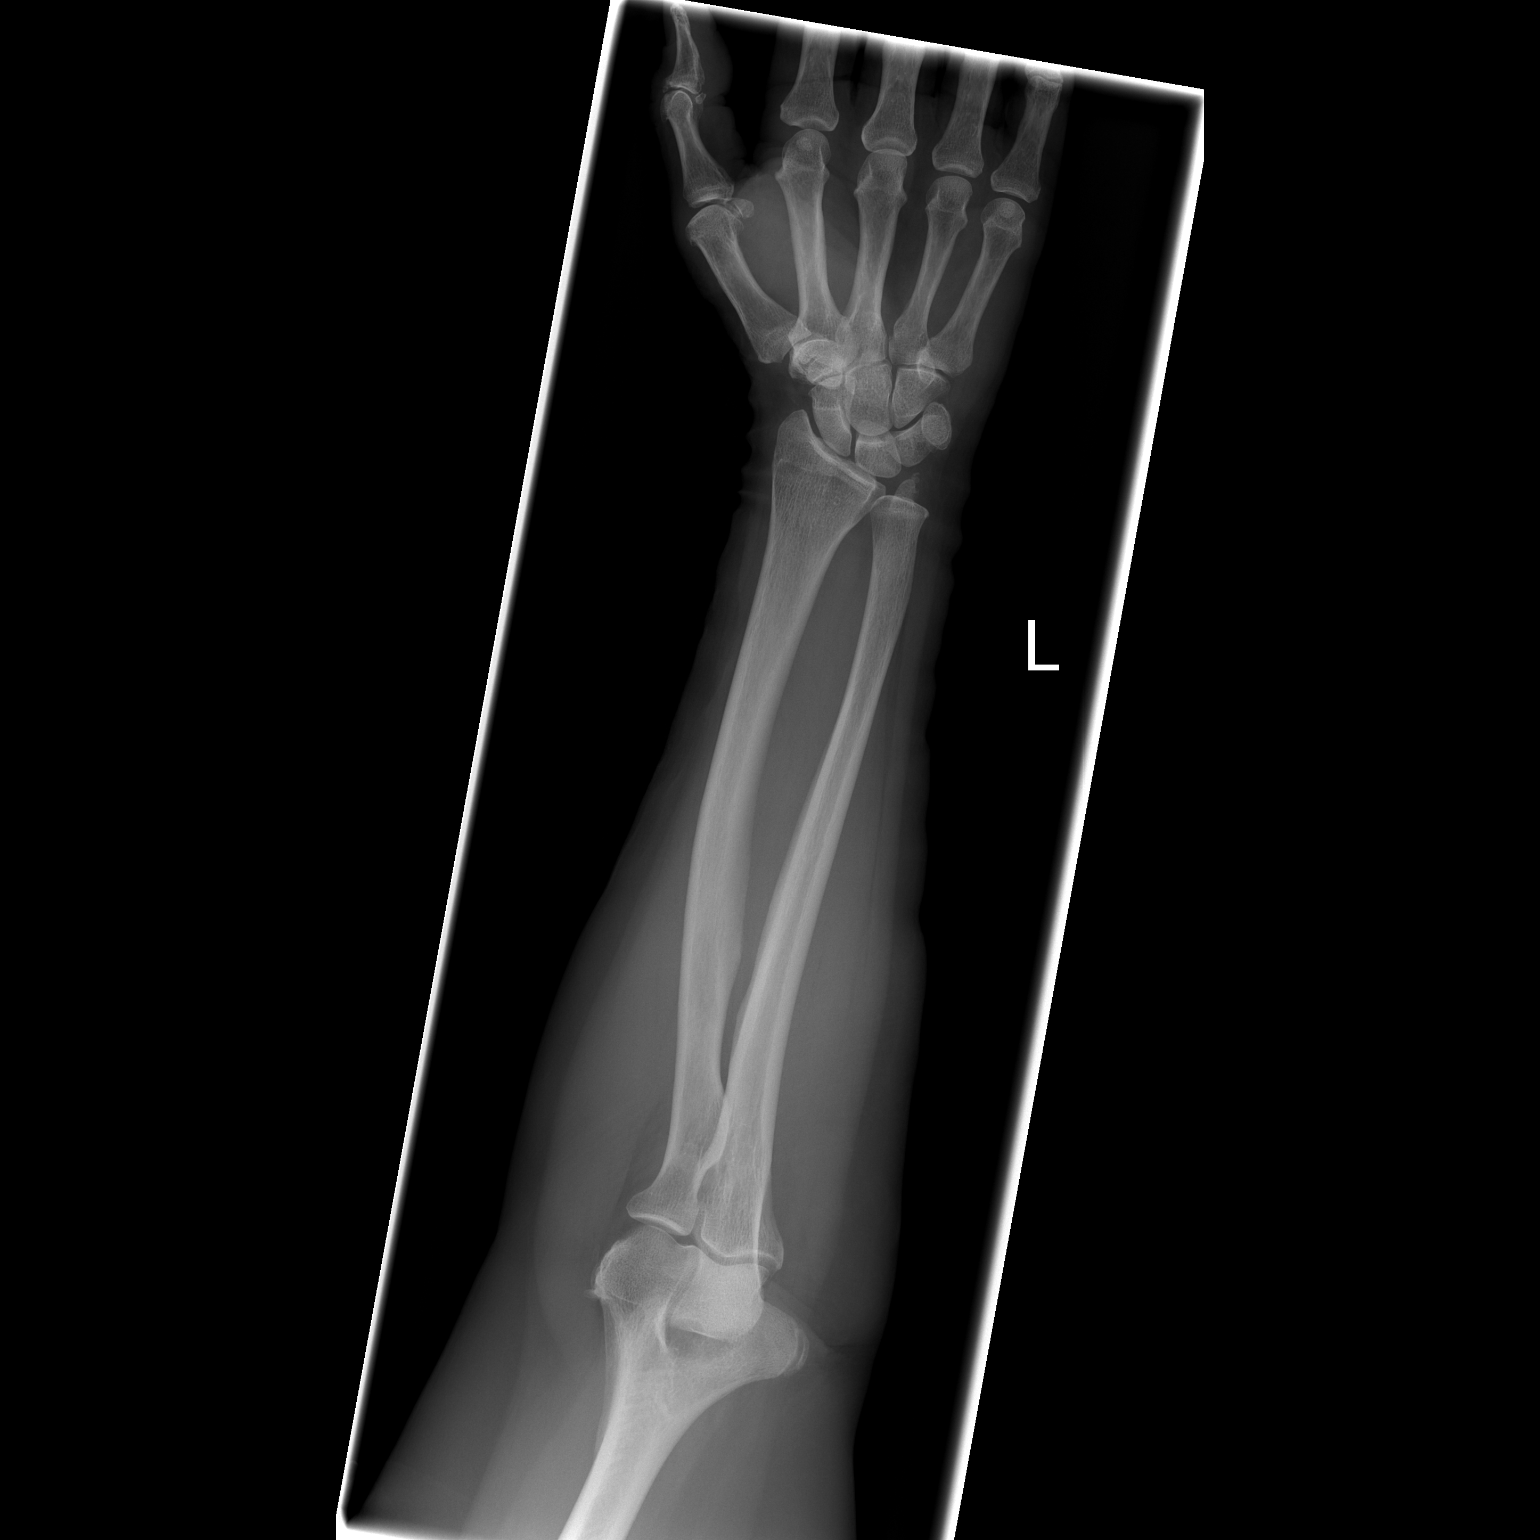

[x forearm lat left]
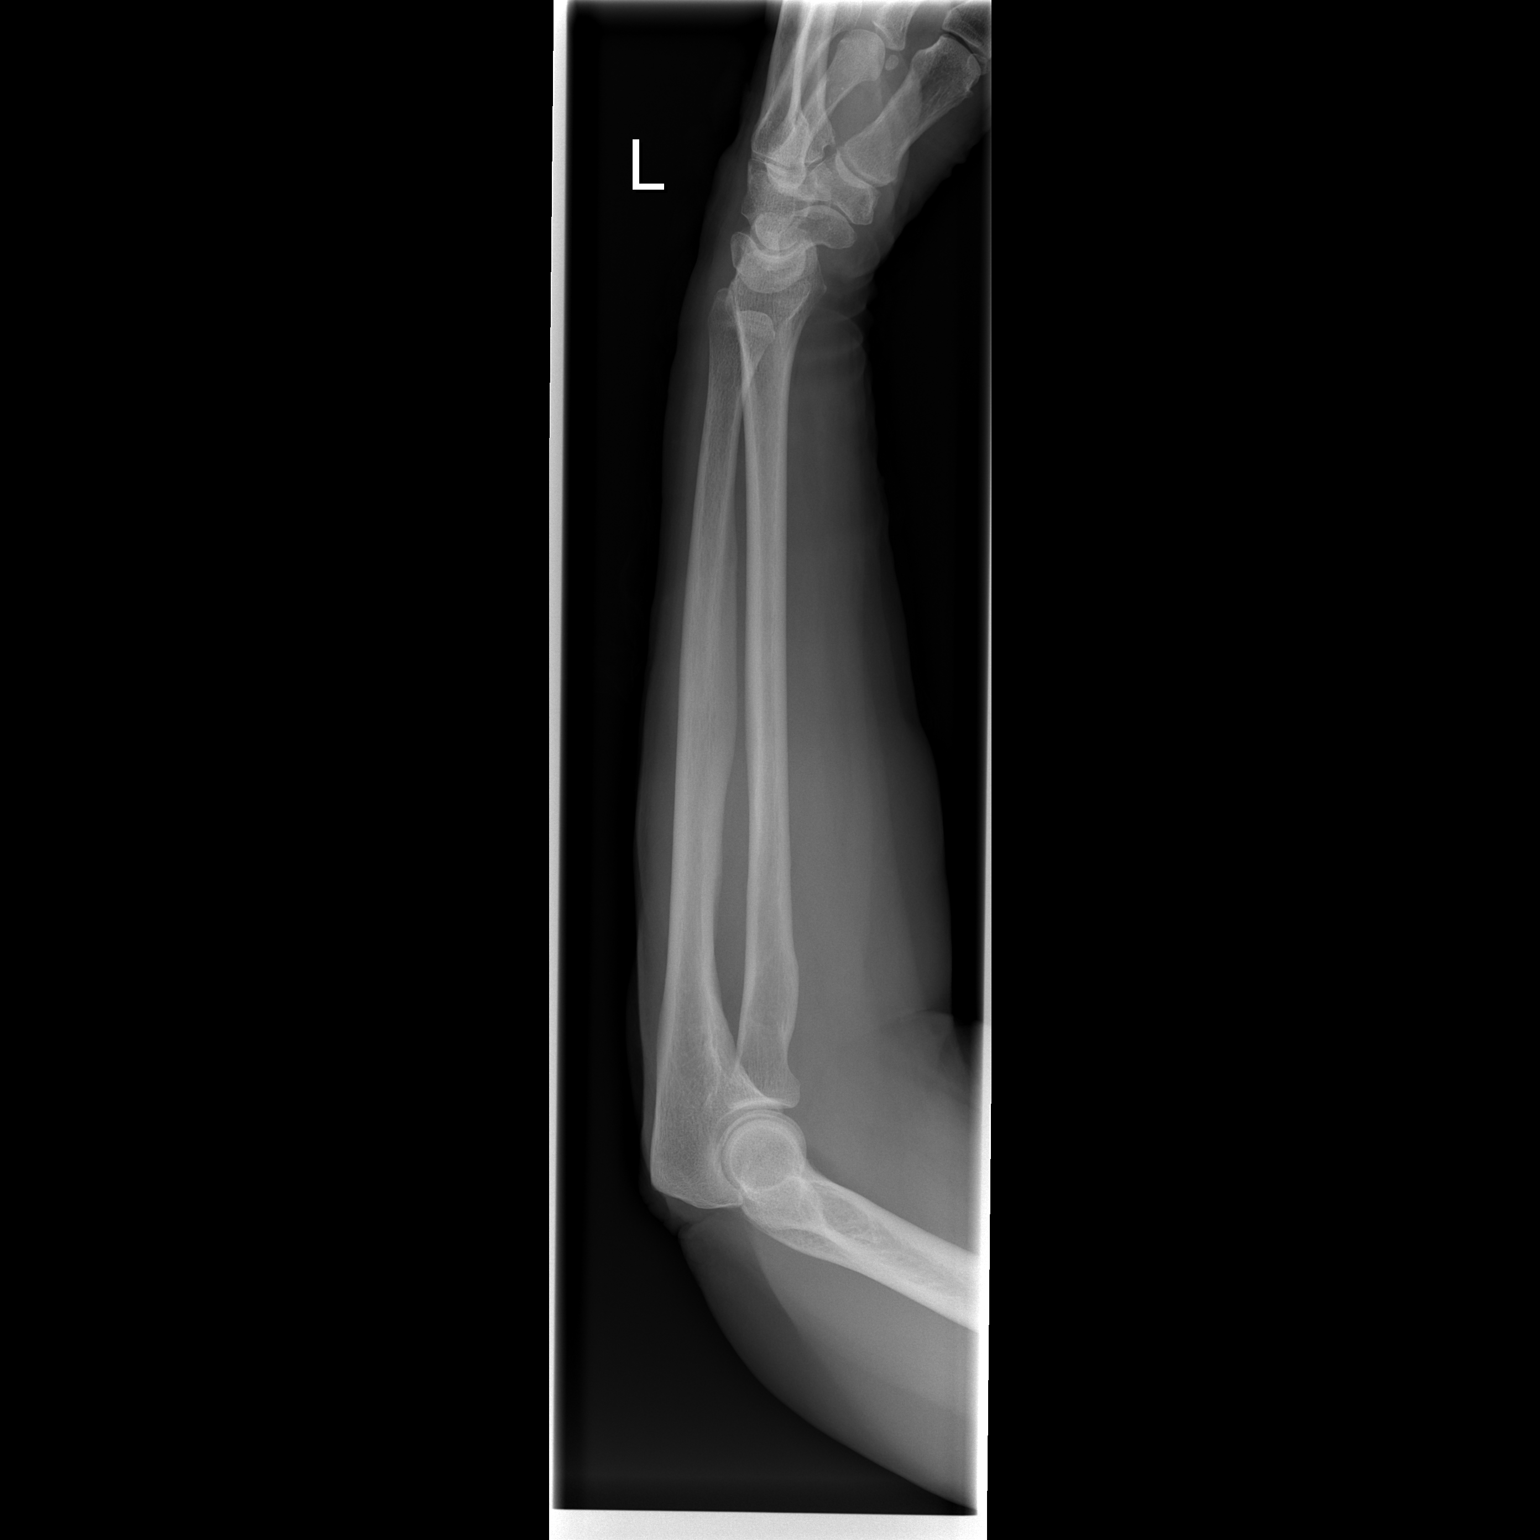

[2 of 2 positions shown; findings below may reference images not displayed]

FINDINGS: No definite fracture.  Accessory ossicle at the ulnar styloid.
IMPRESSION: No acute findings.
 LUMBAR SPINE ? 4 VIEW:
FINDINGS: There is degenerative disk disease at L4-5.  No acute abnormality.  Soft tissues shows cholecystectomy clips and aortoiliac calcification without aneurysm.
IMPRESSION: Degenerative disk disease and postoperative changes ? no acute findings.
 THORACIC SPINE ? 2 VIEW:
FINDINGS: There is a mild compression fracture of RRthat was present on the prior chest x-ray.  I do not see any definite acute compressions.  Pedicles intact.  Soft tissues normal.
IMPRESSION: Partial compression fracture of T9 ? not acute.

## 2005-12-30 IMAGING — CR DG LUMBAR SPINE COMPLETE 4+V
5 series · 5 of 5 positions shown · non-contrast
Comparison: None.
COMPARISON: None.
COMPARISON: No T-spine films but there is a lateral chest x-ray from 06/14/05.

CLINICAL DATA: Fell down stairs ? multiple injuries.
 LEFT FOREARM ? 2 VIEW:

[t l-spine a.p.]
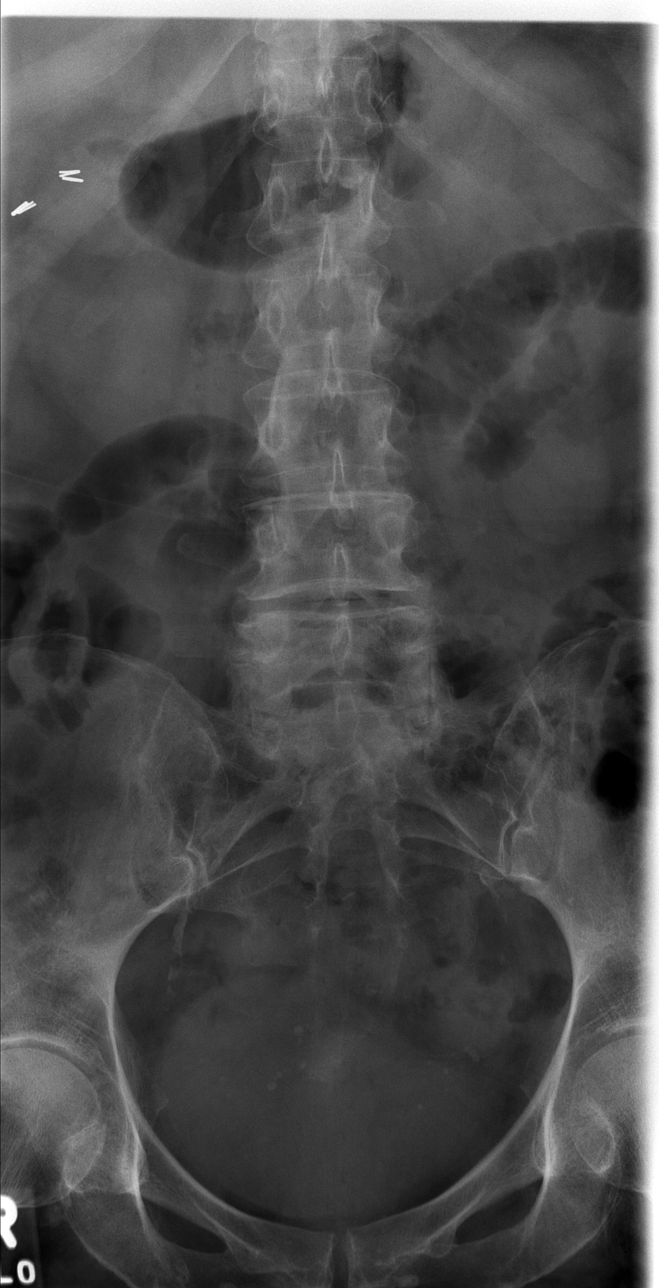

[t l-spine oblique exposure * (1 of 2)]
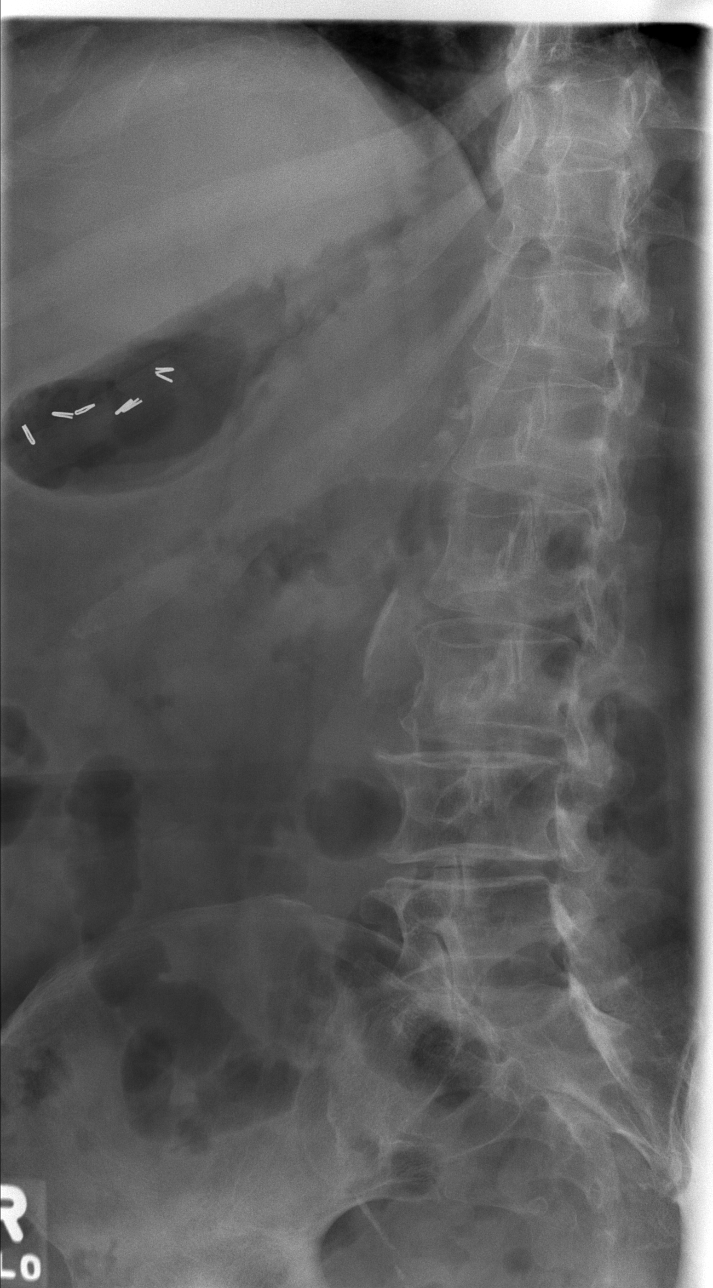

[t l-spine oblique exposure * (2 of 2)]
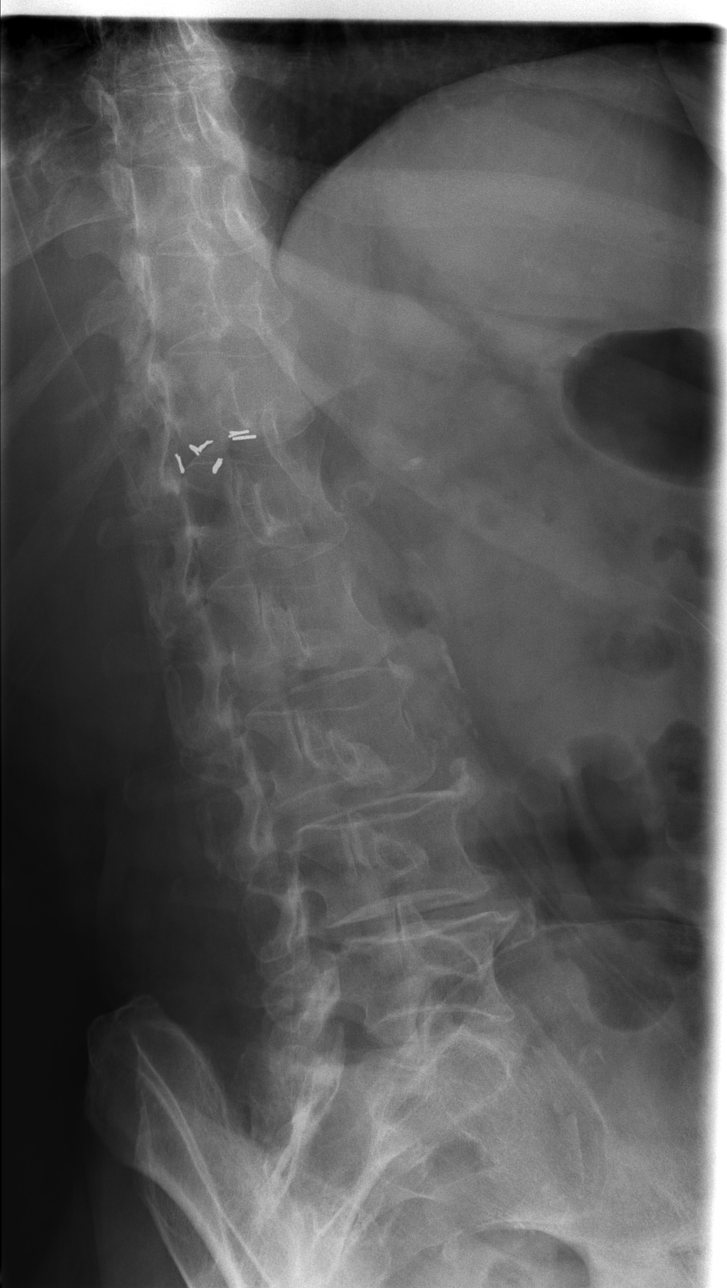

[t l-spine lat *]
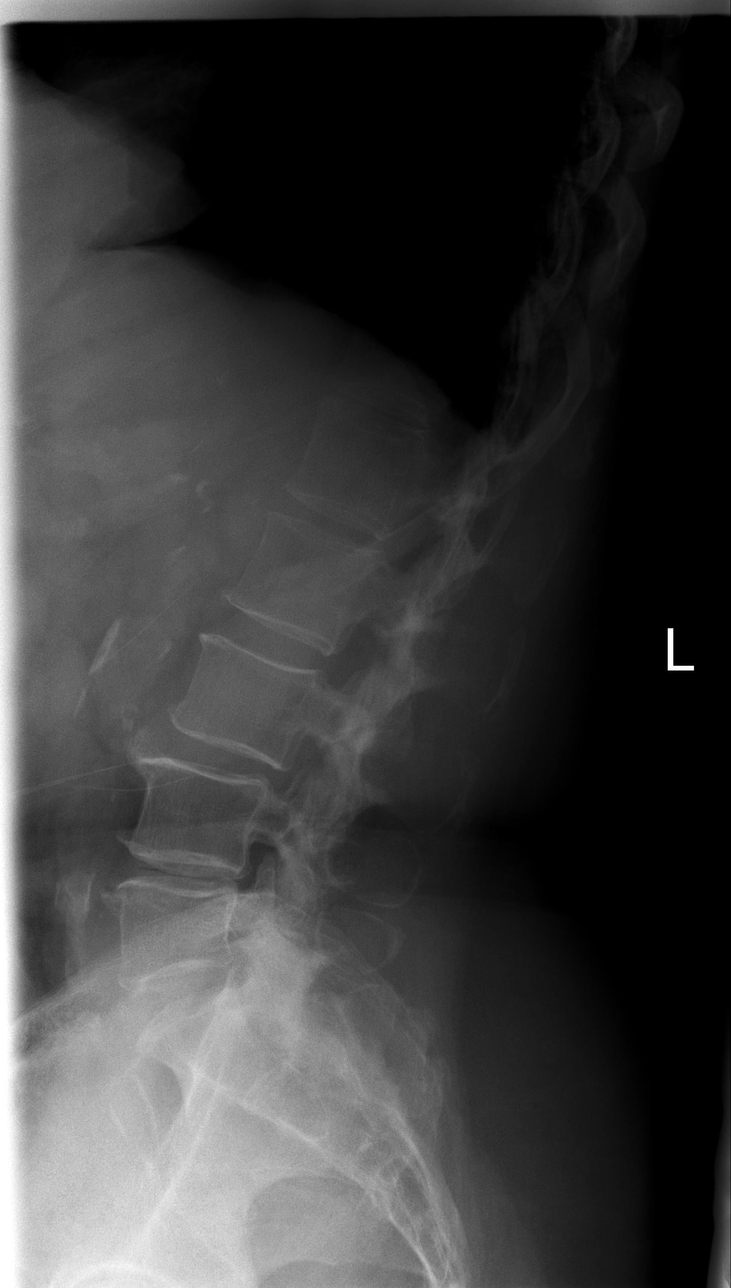

[t l-spine l5-s1 spot *]
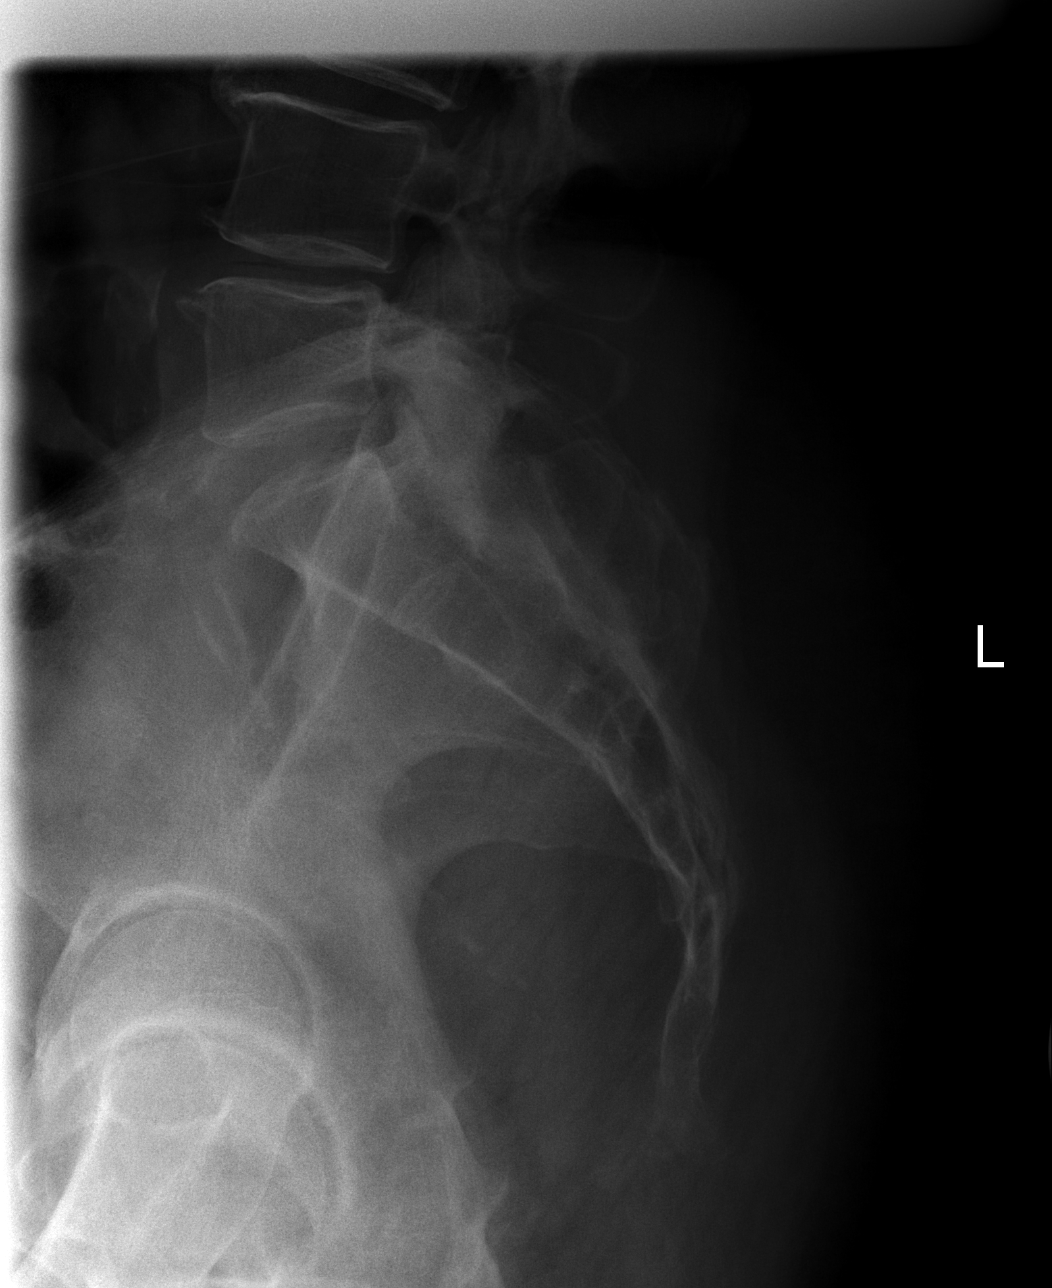

[5 of 5 positions shown; findings below may reference images not displayed]

FINDINGS: No definite fracture.  Accessory ossicle at the ulnar styloid.
IMPRESSION: No acute findings.
 LUMBAR SPINE ? 4 VIEW:
FINDINGS: There is degenerative disk disease at L4-5.  No acute abnormality.  Soft tissues shows cholecystectomy clips and aortoiliac calcification without aneurysm.
IMPRESSION: Degenerative disk disease and postoperative changes ? no acute findings.
 THORACIC SPINE ? 2 VIEW:
FINDINGS: There is a mild compression fracture of RRthat was present on the prior chest x-ray.  I do not see any definite acute compressions.  Pedicles intact.  Soft tissues normal.
IMPRESSION: Partial compression fracture of T9 ? not acute.

## 2005-12-30 IMAGING — CR DG CHEST 2V
2 series · 2 of 2 positions shown · non-contrast
Comparison: 11/21/05.

CLINICAL DATA: Fell down steps--chest pain and back pain.
 CHEST - 2 VIEW:

[t chest supine *]
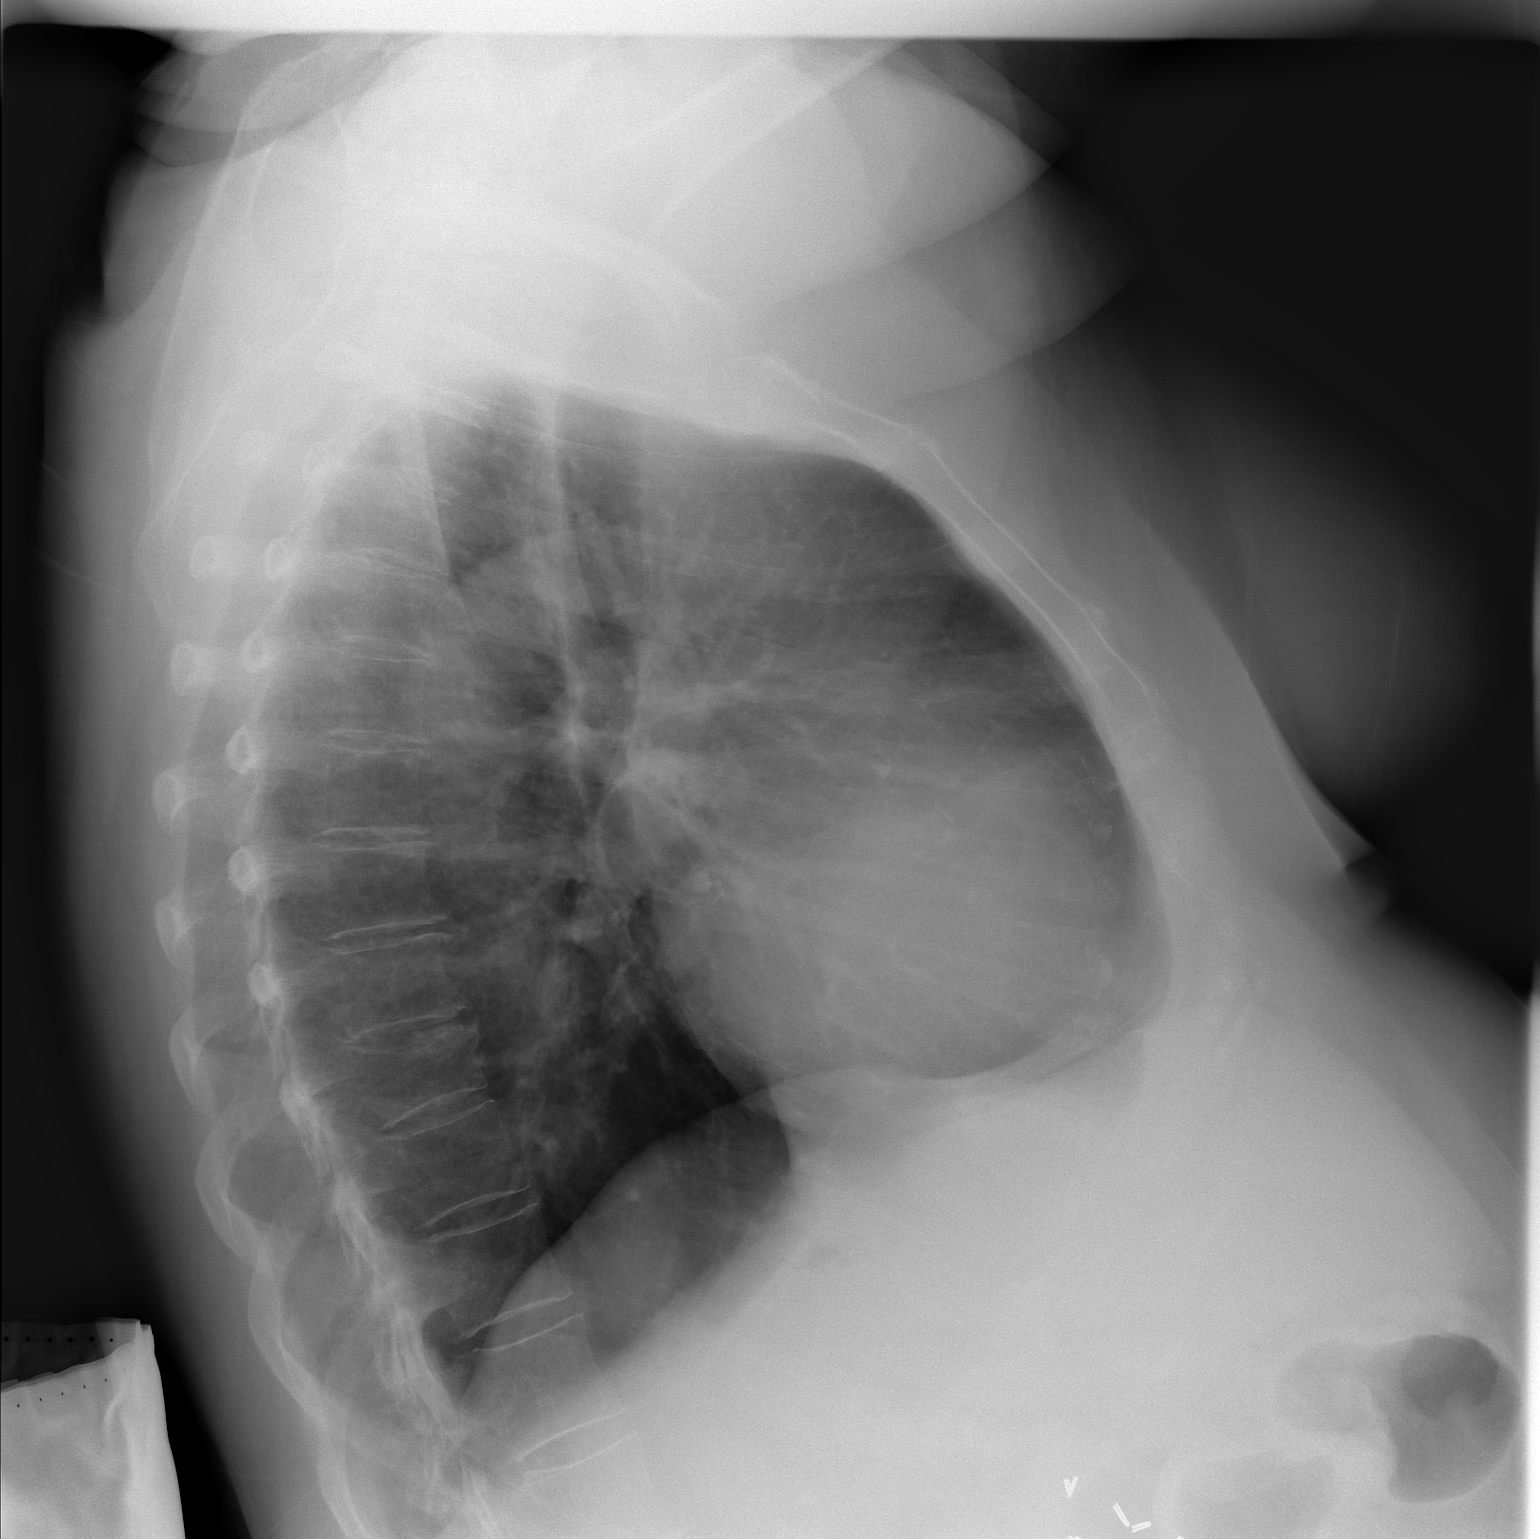

[view not recorded]
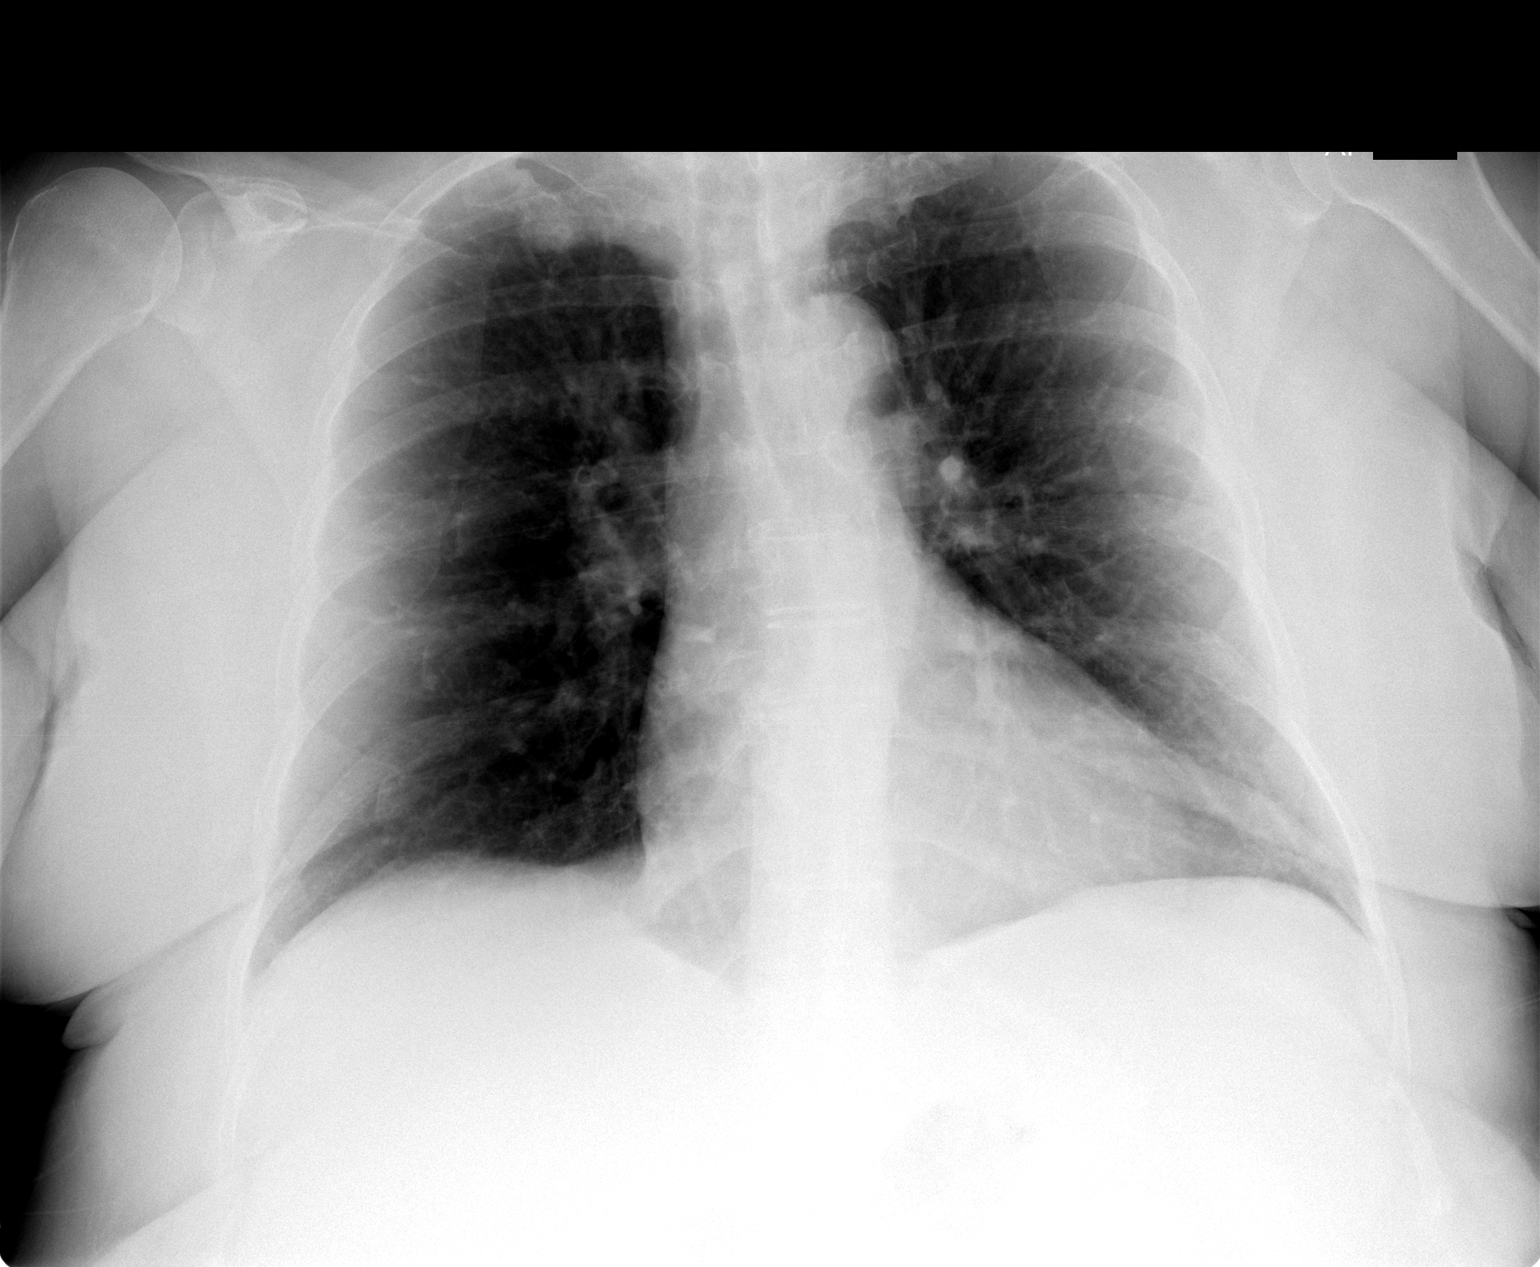

[2 of 2 positions shown; findings below may reference images not displayed]

FINDINGS: Heart size within normal limits.  Mediastinum normal.  Lungs clear.  No pneumothorax or rib fractures.  The lungs are somewhat hyperaerated.
IMPRESSION: No acute findings.

## 2005-12-30 IMAGING — CR DG THORACIC SPINE 2V
3 series · 3 of 3 positions shown · non-contrast
Comparison: None.
COMPARISON: None.
COMPARISON: No T-spine films but there is a lateral chest x-ray from 06/14/05.

CLINICAL DATA: Fell down stairs ? multiple injuries.
 LEFT FOREARM ? 2 VIEW:

[t t-spine a.p. *]
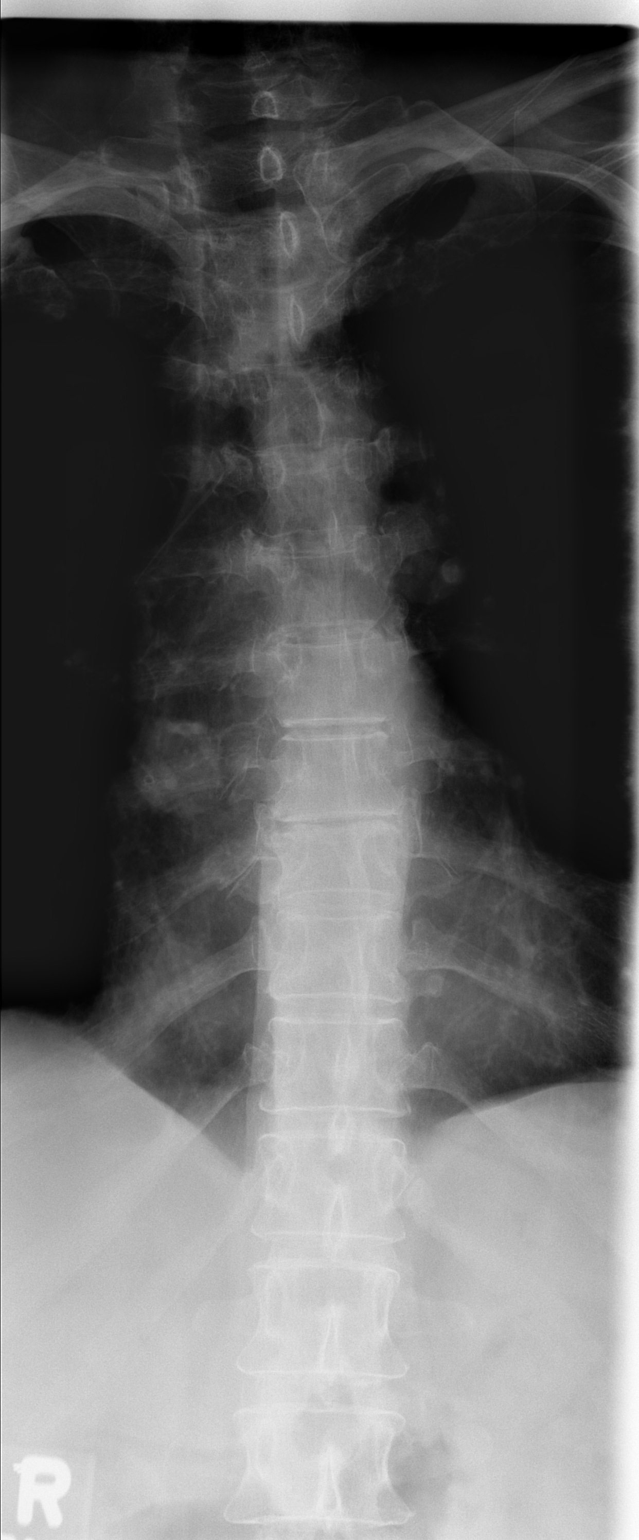

[t swimmers *]
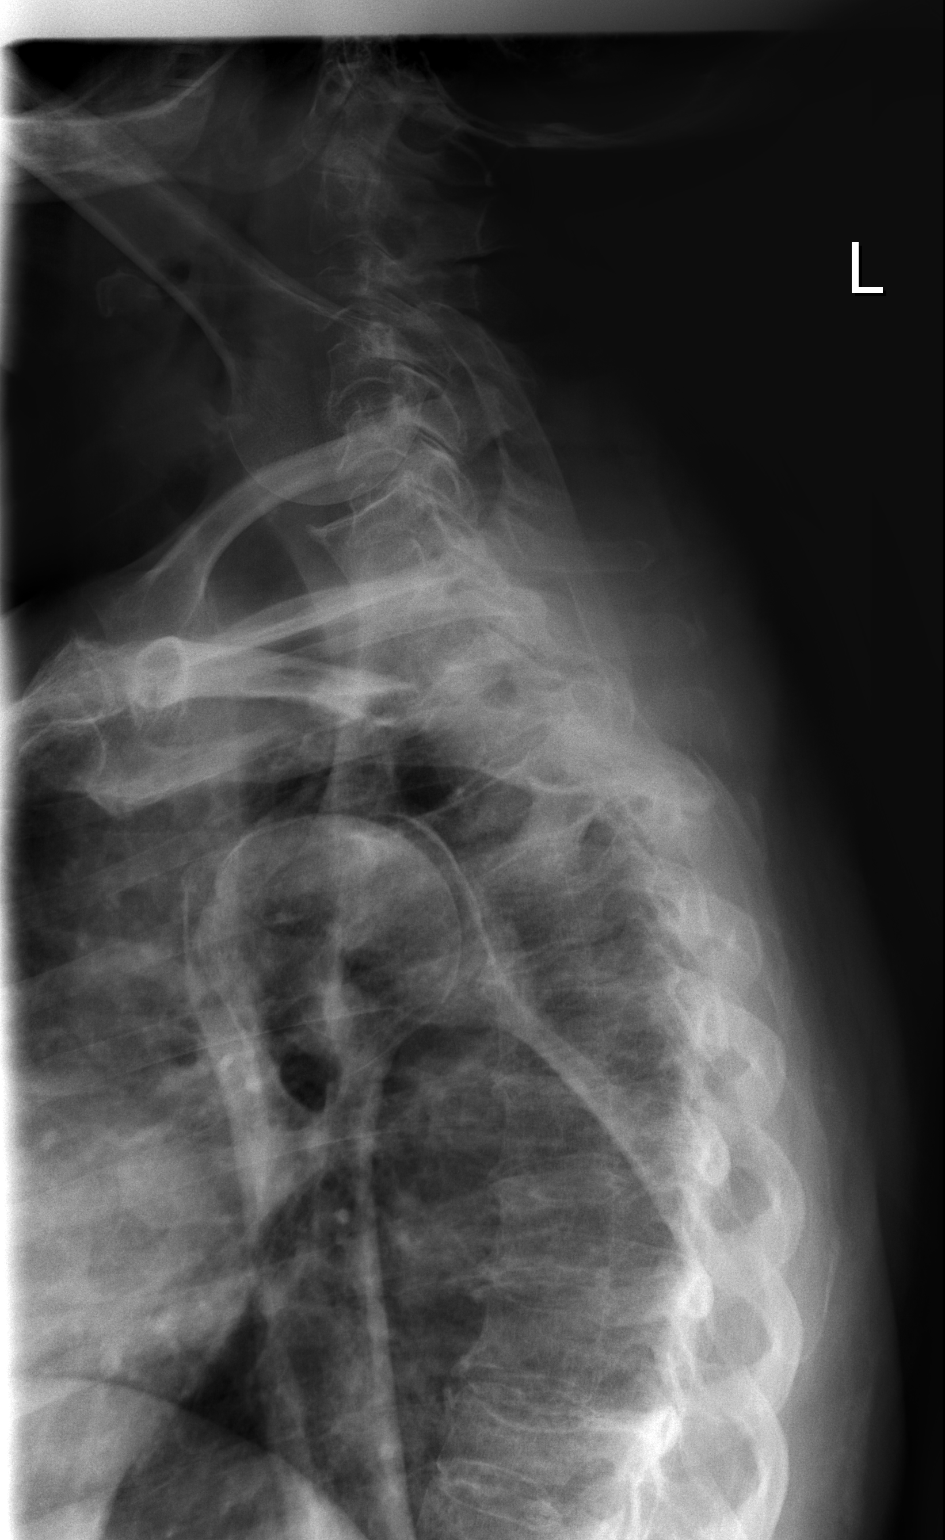

[t t-spine lat *]
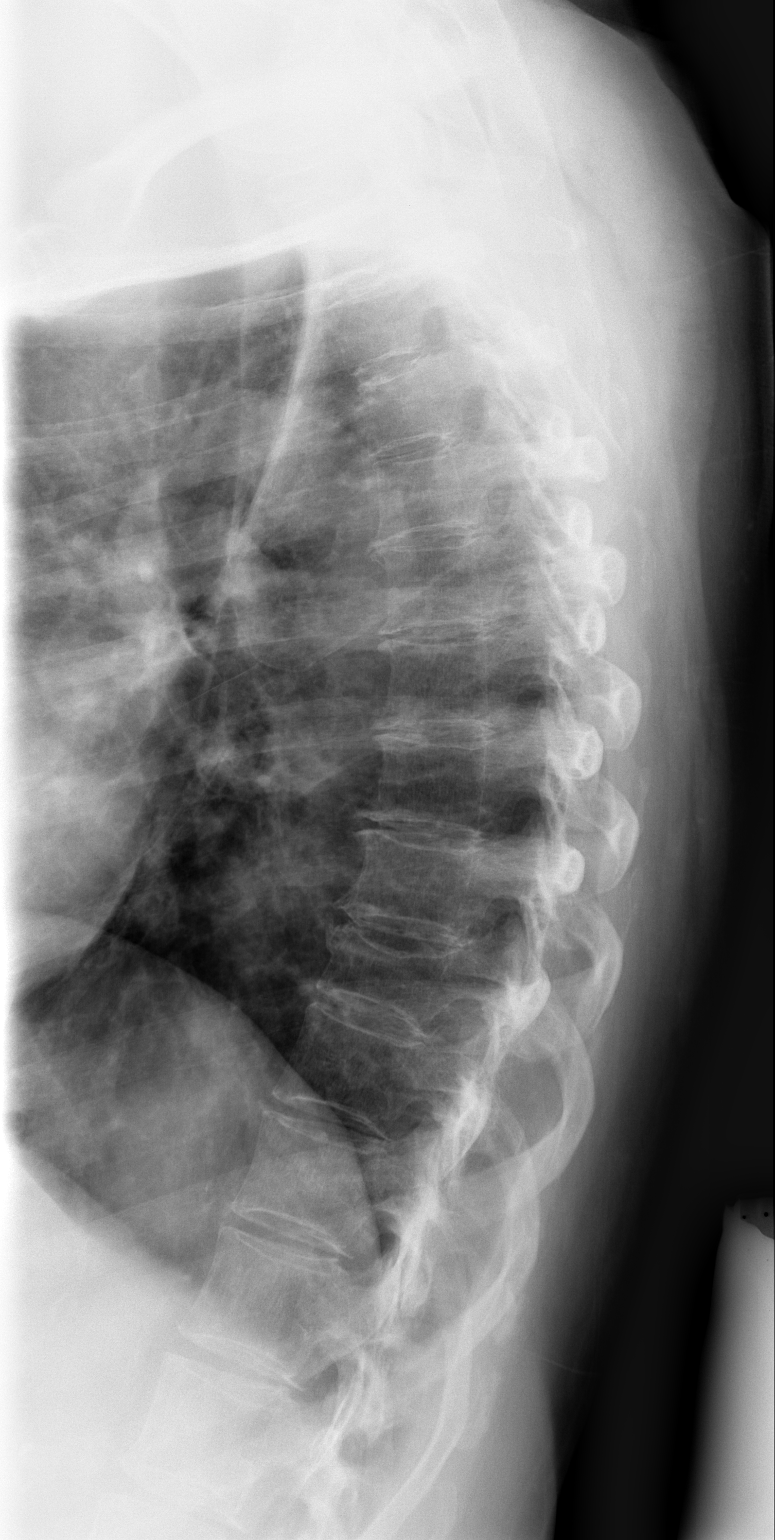

[3 of 3 positions shown; findings below may reference images not displayed]

FINDINGS: No definite fracture.  Accessory ossicle at the ulnar styloid.
IMPRESSION: No acute findings.
 LUMBAR SPINE ? 4 VIEW:
FINDINGS: There is degenerative disk disease at L4-5.  No acute abnormality.  Soft tissues shows cholecystectomy clips and aortoiliac calcification without aneurysm.
IMPRESSION: Degenerative disk disease and postoperative changes ? no acute findings.
 THORACIC SPINE ? 2 VIEW:
FINDINGS: There is a mild compression fracture of RRthat was present on the prior chest x-ray.  I do not see any definite acute compressions.  Pedicles intact.  Soft tissues normal.
IMPRESSION: Partial compression fracture of T9 ? not acute.

## 2005-12-30 IMAGING — CR DG PELVIS 1-2V
1 series · 1 of 1 positions shown · non-contrast
Comparison: None.

CLINICAL DATA: Fell down stairs ? multiple injuries.
 PELVIS ? 1 VIEW:

[t pelvis a.p.]
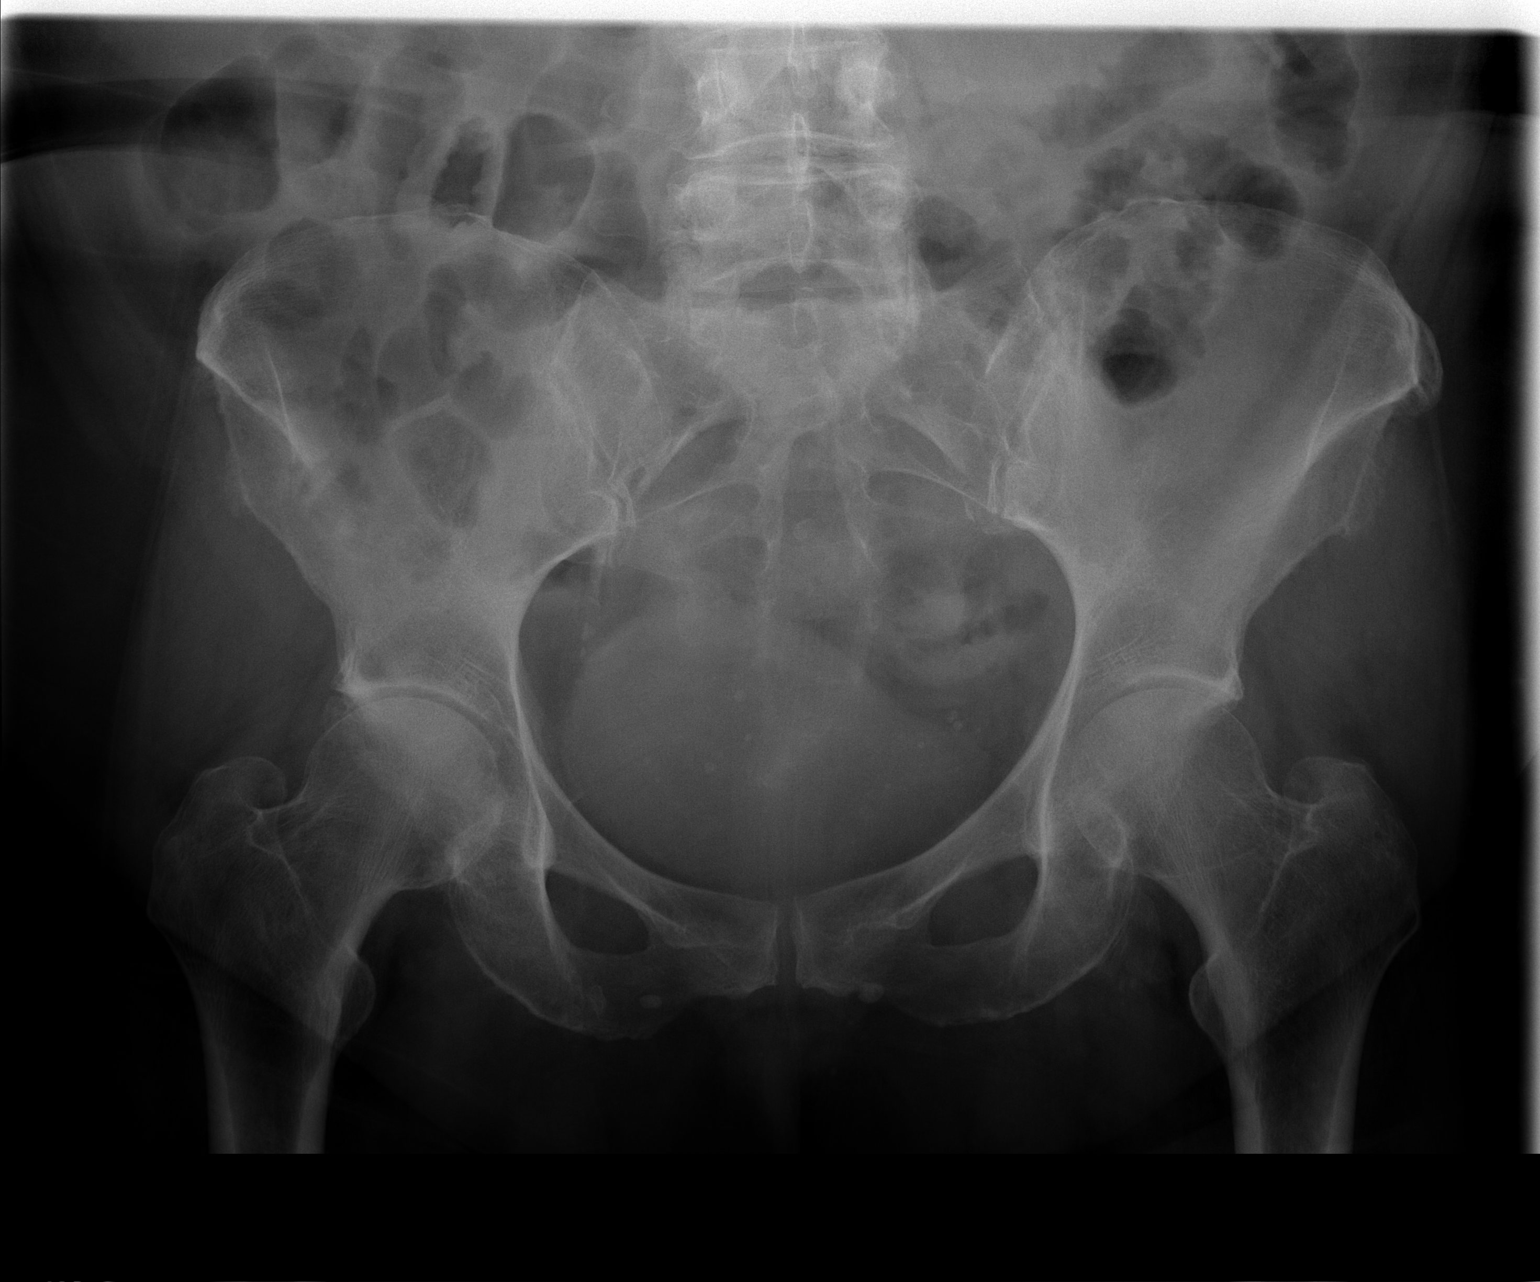

[1 of 1 positions shown; findings below may reference images not displayed]

FINDINGS: There are mild to moderate degenerative changes of the hip.  Bony pelvis intact.  No acute findings.
IMPRESSION: Degenerative changes of the hips ? no acute abnormality.

## 2005-12-30 IMAGING — CT CT HEAD W/O CM
2 series · 14 of 30 positions shown, 16 images · IV contrast (agent unspecified)
Comparison: 02/10/05.
COMPARISON: None.

CLINICAL DATA: Fell down stairs ? headache and neck pain.
 HEAD CT WITHOUT CONTRAST:
TECHNIQUE: Contiguous axial images were obtained from the base of the skull through the vertex according to standard protocol without contrast.
TECHNIQUE: Multidetector CT imaging of the cervical spine was performed.  Multiplanar CT image reconstructions were also generated with sagittal and coronal reformats.

[Series 3: head_seq 4.5 h42s st · axial · 0.43mm/px · z∈[+1032,+1131]mm · 6 of 32 slices shown, 8 images]
[im 5/32  brain]
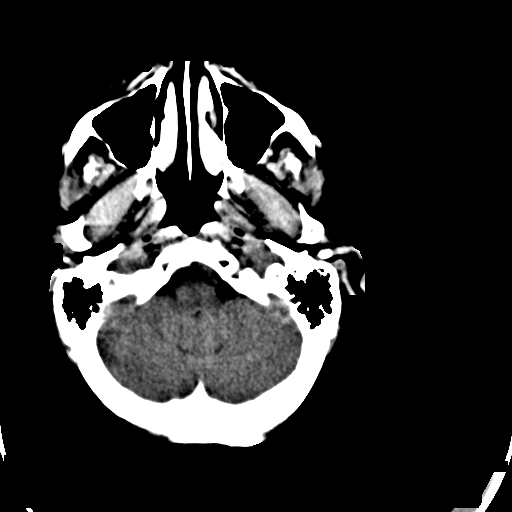
[im 5/32  bone]
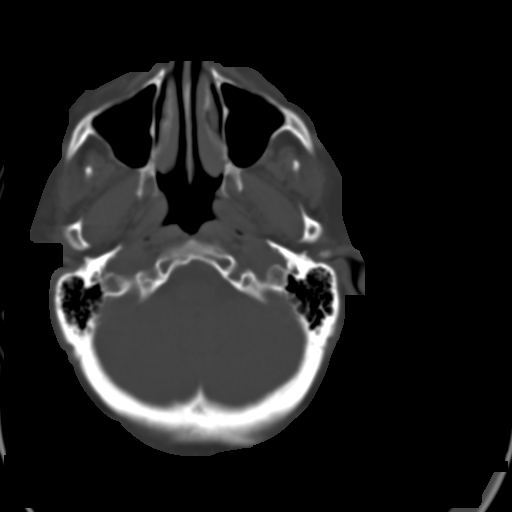
[im 9/32  brain]
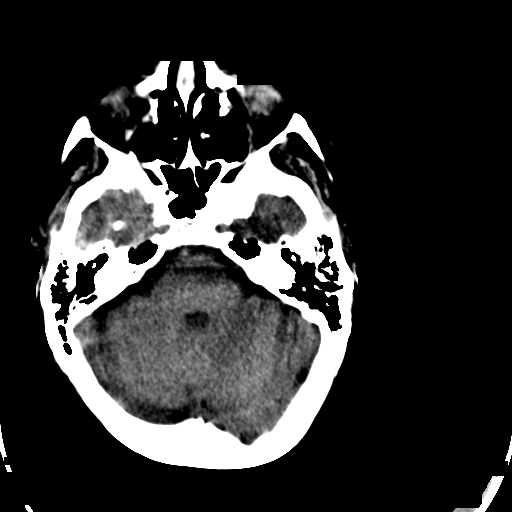
[im 14/32  brain]
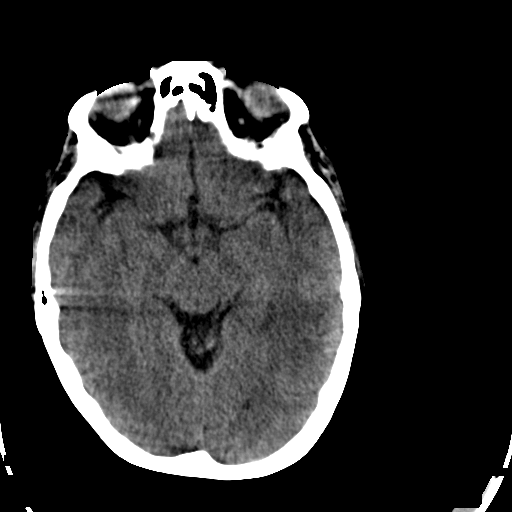
[im 18/32  brain]
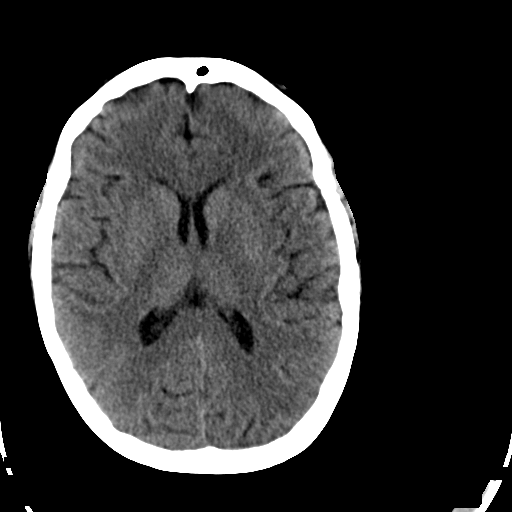
[im 23/32  brain]
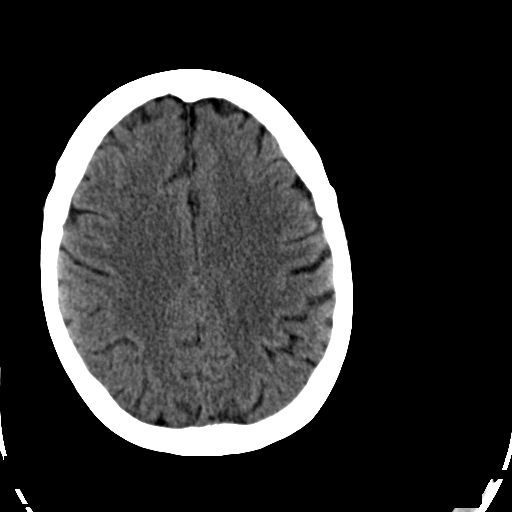
[im 23/32  bone]
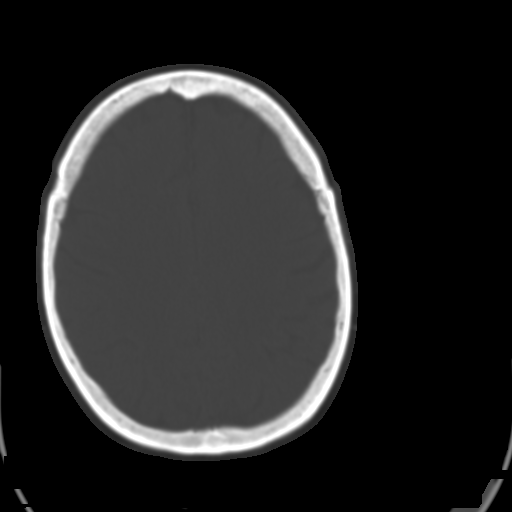
[im 27/32  brain]
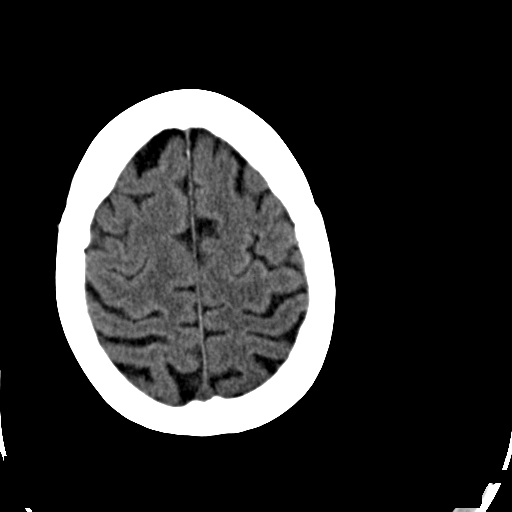

[Series 4: c_spine 2.0 b20s · axial · 0.26mm/px · z∈[+922,+1052]mm · 8 of 81 slices shown]
[im 8/81  brain]
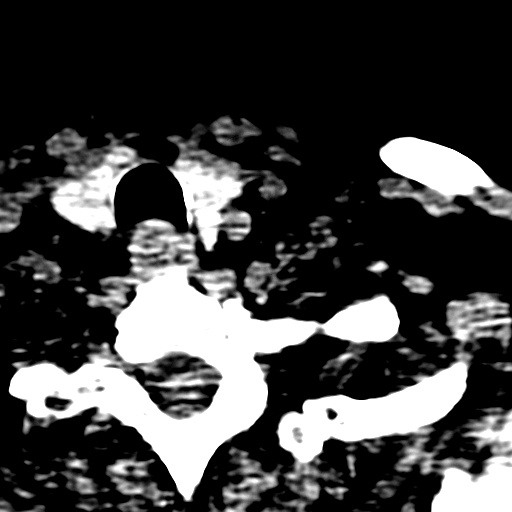
[im 16/81  brain]
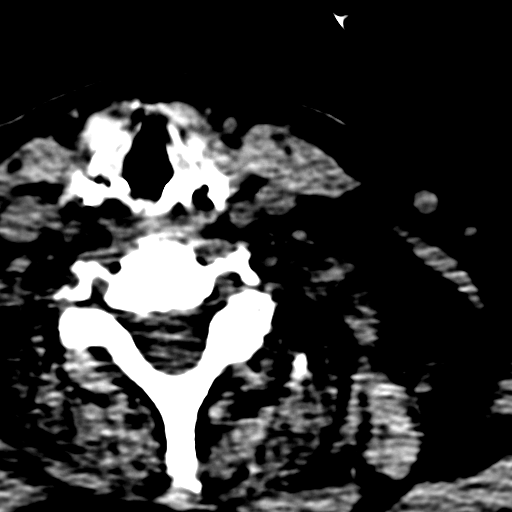
[im 27/81  brain]
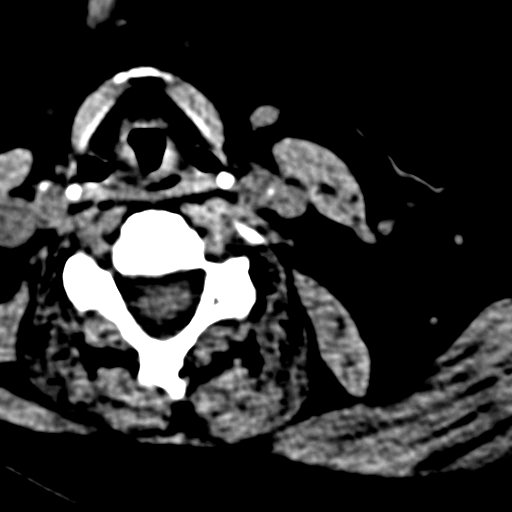
[im 35/81  brain]
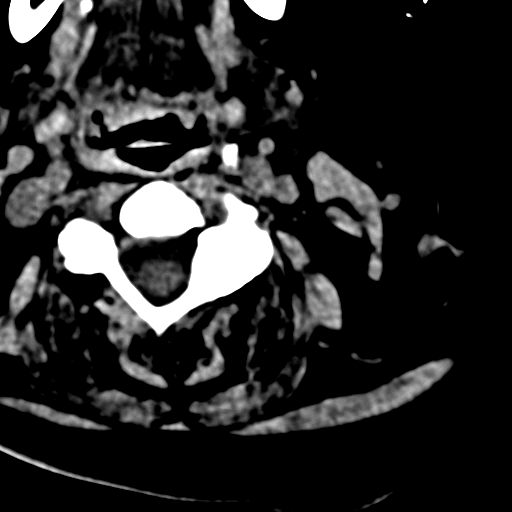
[im 46/81  brain]
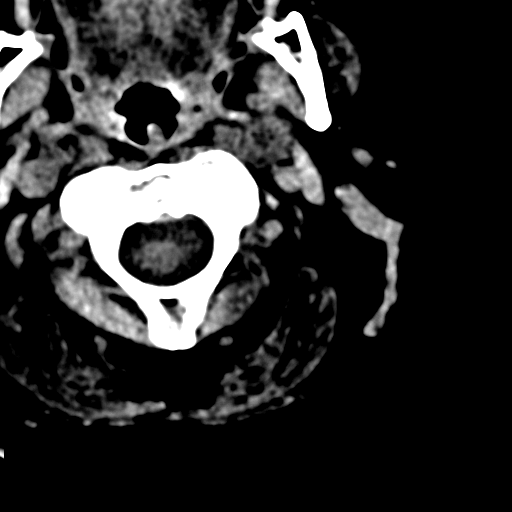
[im 54/81  brain]
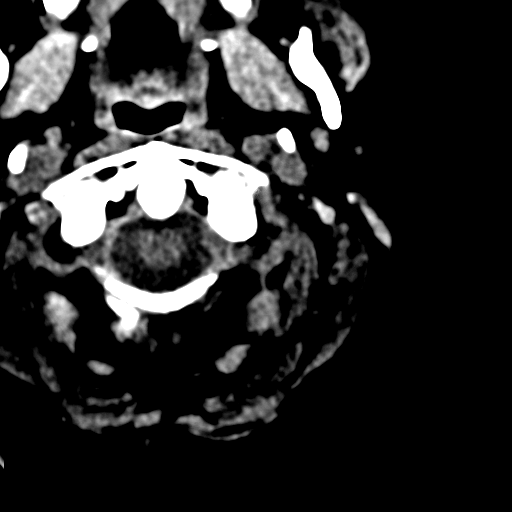
[im 65/81  brain]
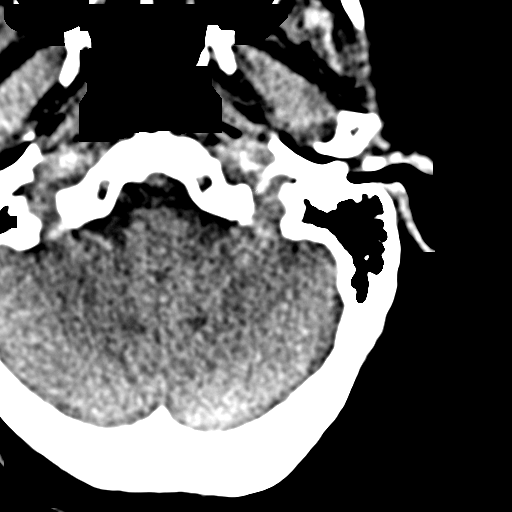
[im 73/81  brain]
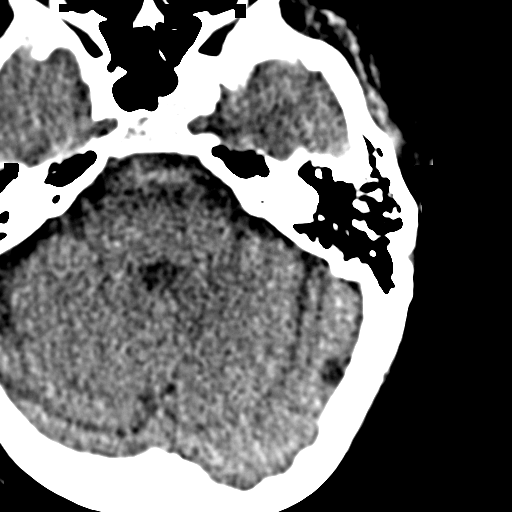

[14 of 30 positions shown; findings below may reference images not displayed]

FINDINGS: There is no evidence of intracranial hemorrhage, brain edema, or mass effect.  No other intra-axial abnormalities are seen, and the ventricles are within normal limits.  No abnormal extra-axial fluid collections or masses are identified.  No skull abnormalities are noted.
IMPRESSION: Negative non-contrast head CT.
 CERVICAL SPINE CT WITHOUT CONTRAST:
FINDINGS: In three planes, there are no definite cervical fractures identified.  No subluxation or prevertebral soft tissue swelling.  There are moderate degenerative changes at multiple levels.  No critical spinal or foraminal stenosis is evident.
IMPRESSION: Moderate degenerative changes ? no acute findings.

## 2006-01-01 ENCOUNTER — Emergency Department (HOSPITAL_COMMUNITY): Admission: EM | Admit: 2006-01-01 | Discharge: 2006-01-01 | Payer: Self-pay | Admitting: Emergency Medicine

## 2006-01-06 ENCOUNTER — Emergency Department (HOSPITAL_COMMUNITY): Admission: EM | Admit: 2006-01-06 | Discharge: 2006-01-06 | Payer: Self-pay | Admitting: Emergency Medicine

## 2006-01-07 ENCOUNTER — Ambulatory Visit: Payer: Self-pay | Admitting: Family Medicine

## 2006-01-15 ENCOUNTER — Emergency Department (HOSPITAL_COMMUNITY): Admission: EM | Admit: 2006-01-15 | Discharge: 2006-01-15 | Payer: Self-pay | Admitting: *Deleted

## 2006-01-18 IMAGING — CR DG CHEST 1V PORT
1 series · 1 of 1 positions shown · non-contrast
Comparison: 11/27/05.

CLINICAL DATA: Shortness of breath.  
 PORTABLE CHEST - 1 VIEW - 12/16/05:

[view not recorded]
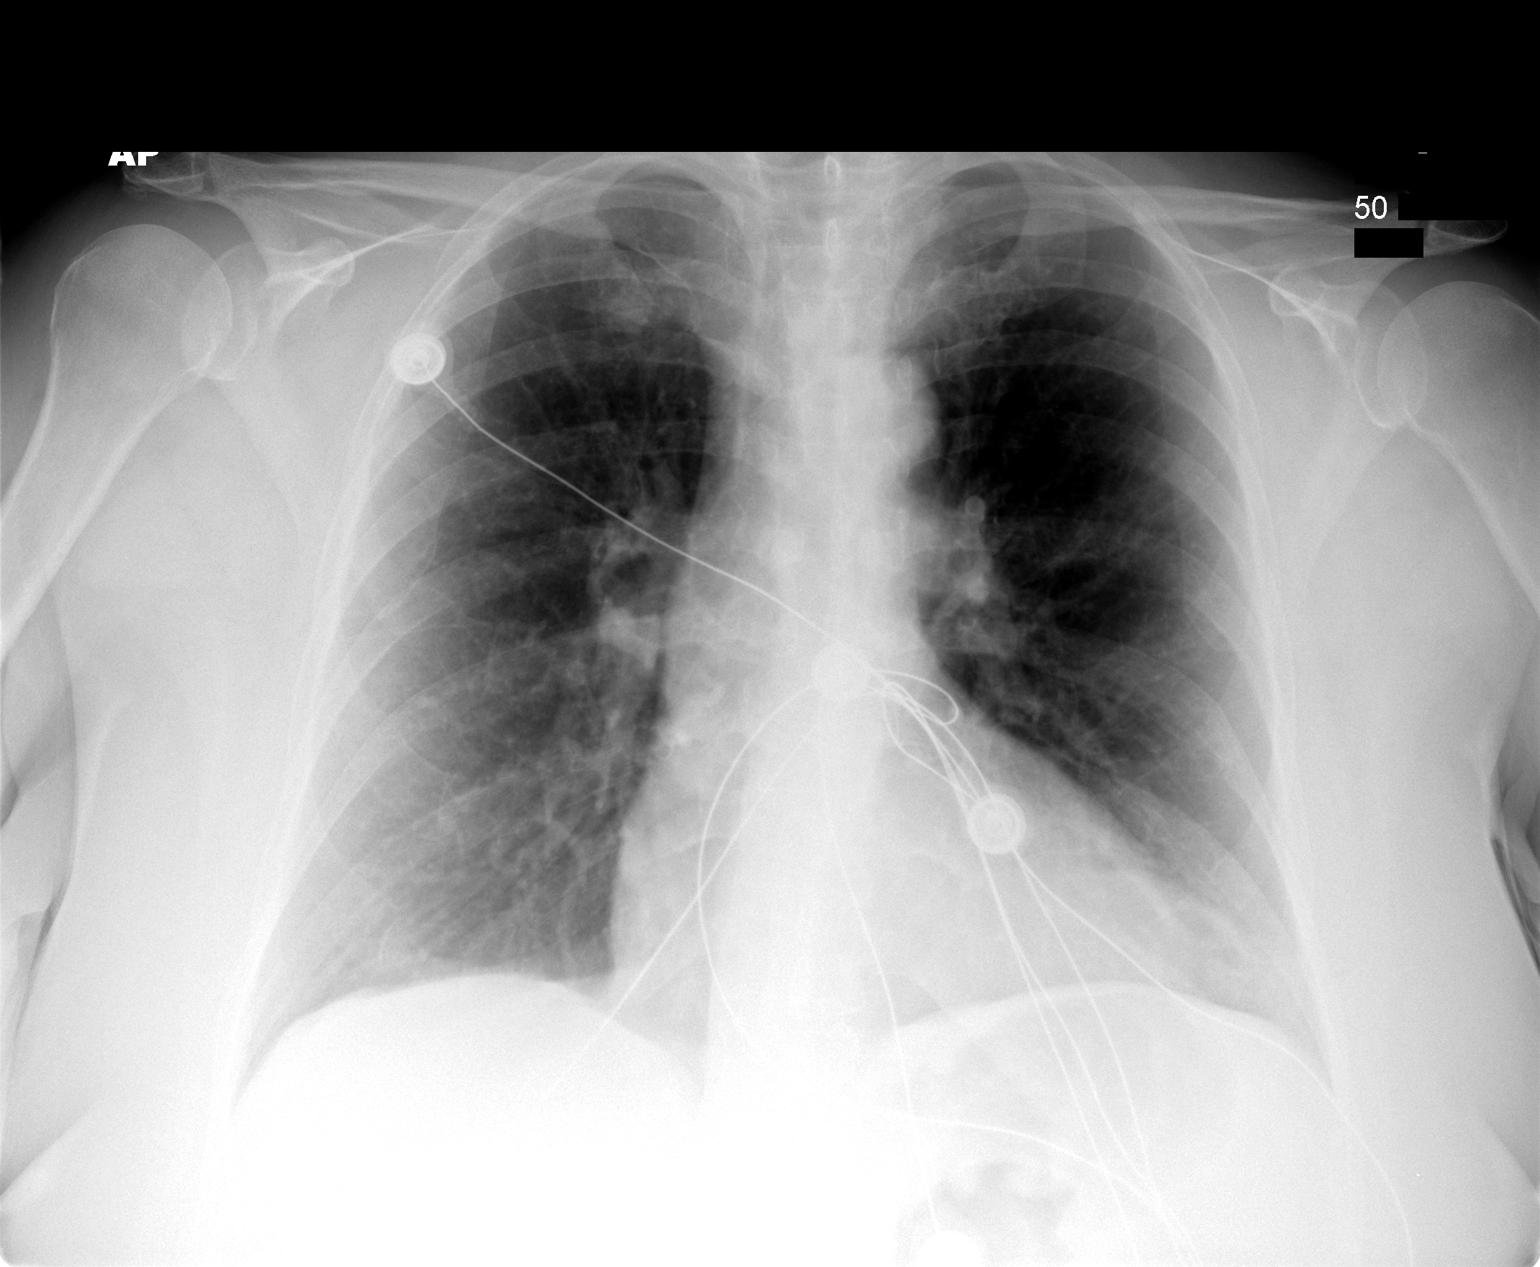

[1 of 1 positions shown; findings below may reference images not displayed]

FINDINGS: AP film at 1734 hours shows no focal consolidation, edema, or pleural effusion.  The cardiopericardial silhouette is within normal limits for size and stable.  The bony structures of the visualized thorax are intact.  Telemetry leads overlie the chest.
IMPRESSION: No acute cardiopulmonary process.

## 2006-01-22 ENCOUNTER — Emergency Department (HOSPITAL_COMMUNITY): Admission: EM | Admit: 2006-01-22 | Discharge: 2006-01-22 | Payer: Self-pay | Admitting: Emergency Medicine

## 2006-01-24 ENCOUNTER — Ambulatory Visit: Payer: Self-pay | Admitting: Family Medicine

## 2006-01-24 ENCOUNTER — Emergency Department (HOSPITAL_COMMUNITY): Admission: EM | Admit: 2006-01-24 | Discharge: 2006-01-24 | Payer: Self-pay | Admitting: Emergency Medicine

## 2006-01-25 IMAGING — CR DG CHEST 1V PORT
1 series · 1 of 1 positions shown · non-contrast
Comparison: 12/21/05.

CLINICAL DATA: Respiratory distress, shortness of breath. 
 PORTABLE CHEST ? 12/23/05:

[view not recorded]
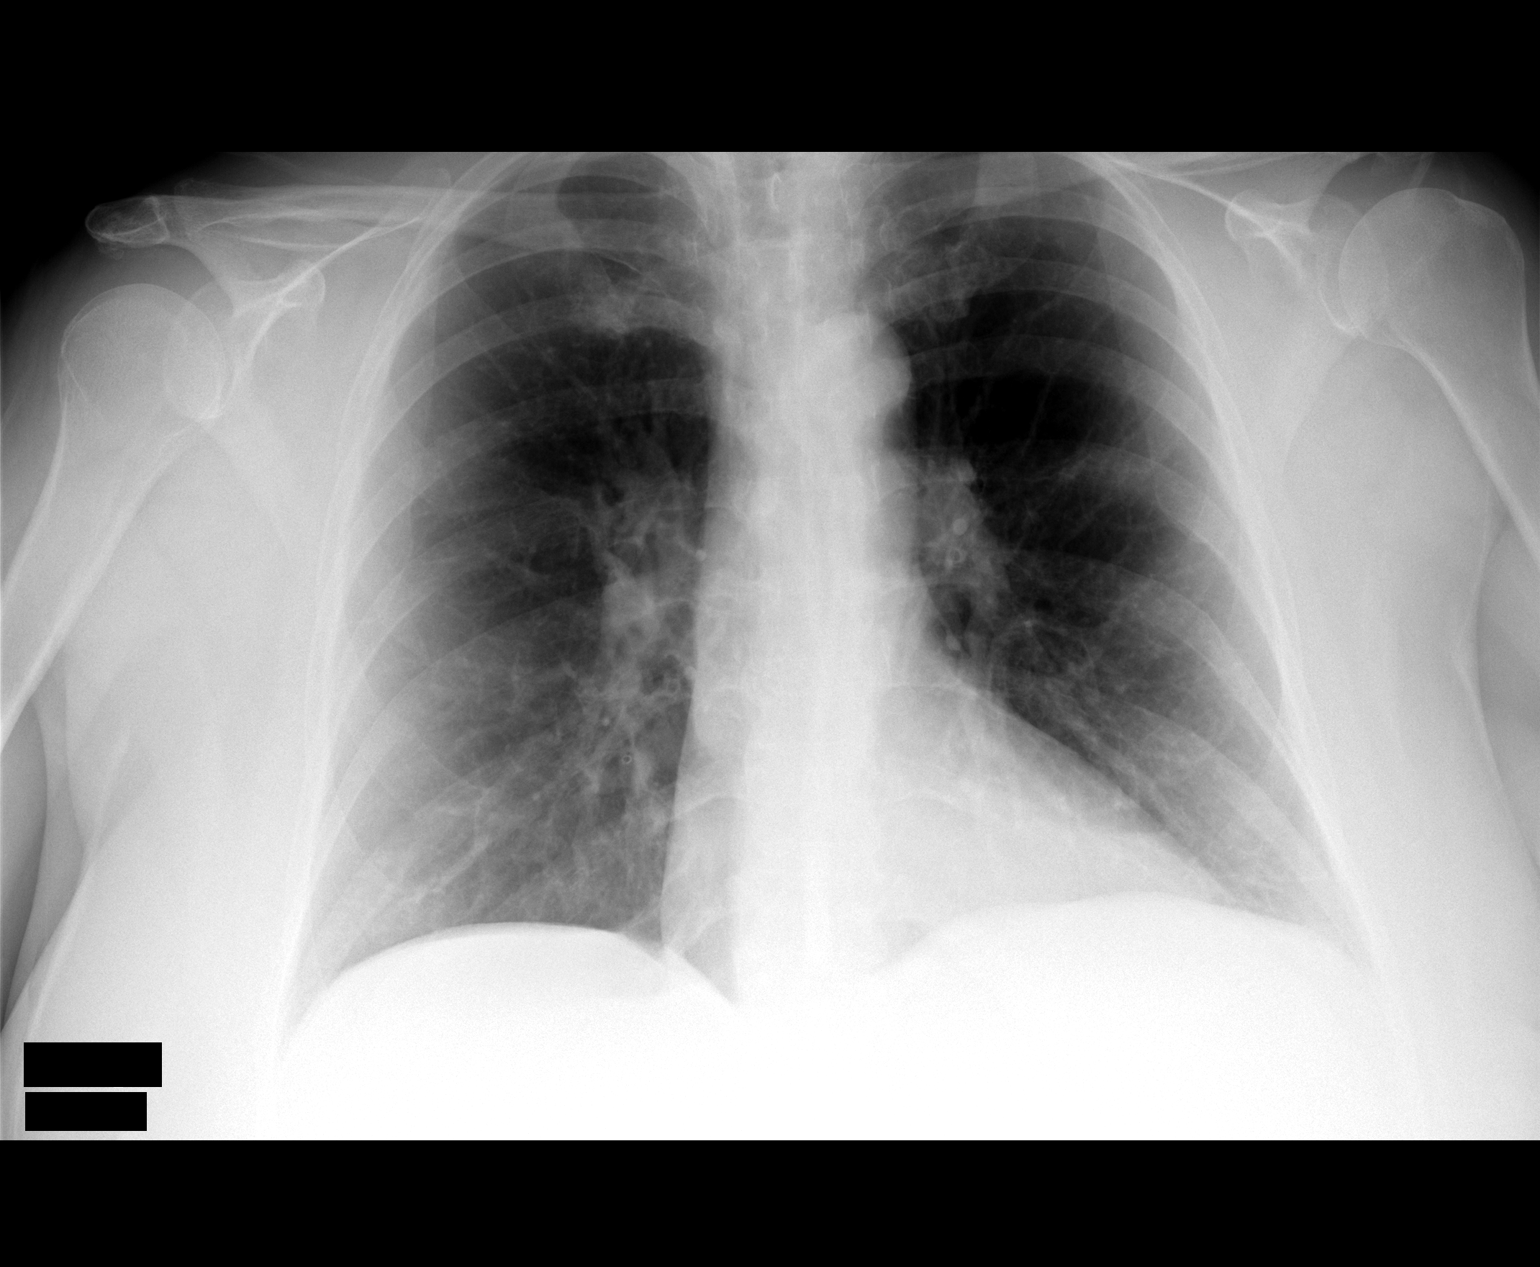

[1 of 1 positions shown; findings below may reference images not displayed]

FINDINGS: There is borderline airway thickening but otherwise the lungs appear clear.  No discrete airspace opacities identified.  Heart and mediastinum appear unremarkable.
IMPRESSION: Borderline central airway thickening, which could be a manifestation of bronchitis or reactive airways disease.

## 2006-01-28 ENCOUNTER — Ambulatory Visit: Payer: Self-pay | Admitting: Family Medicine

## 2006-02-03 IMAGING — CR DG CHEST 2V
2 series · 2 of 2 positions shown · non-contrast
Comparison: 12/23/05.

CLINICAL DATA: Shortness of breath, wheezing, back pain, and cough.
 CHEST - 2 VIEW ? 01/01/06:

[w chest pa]
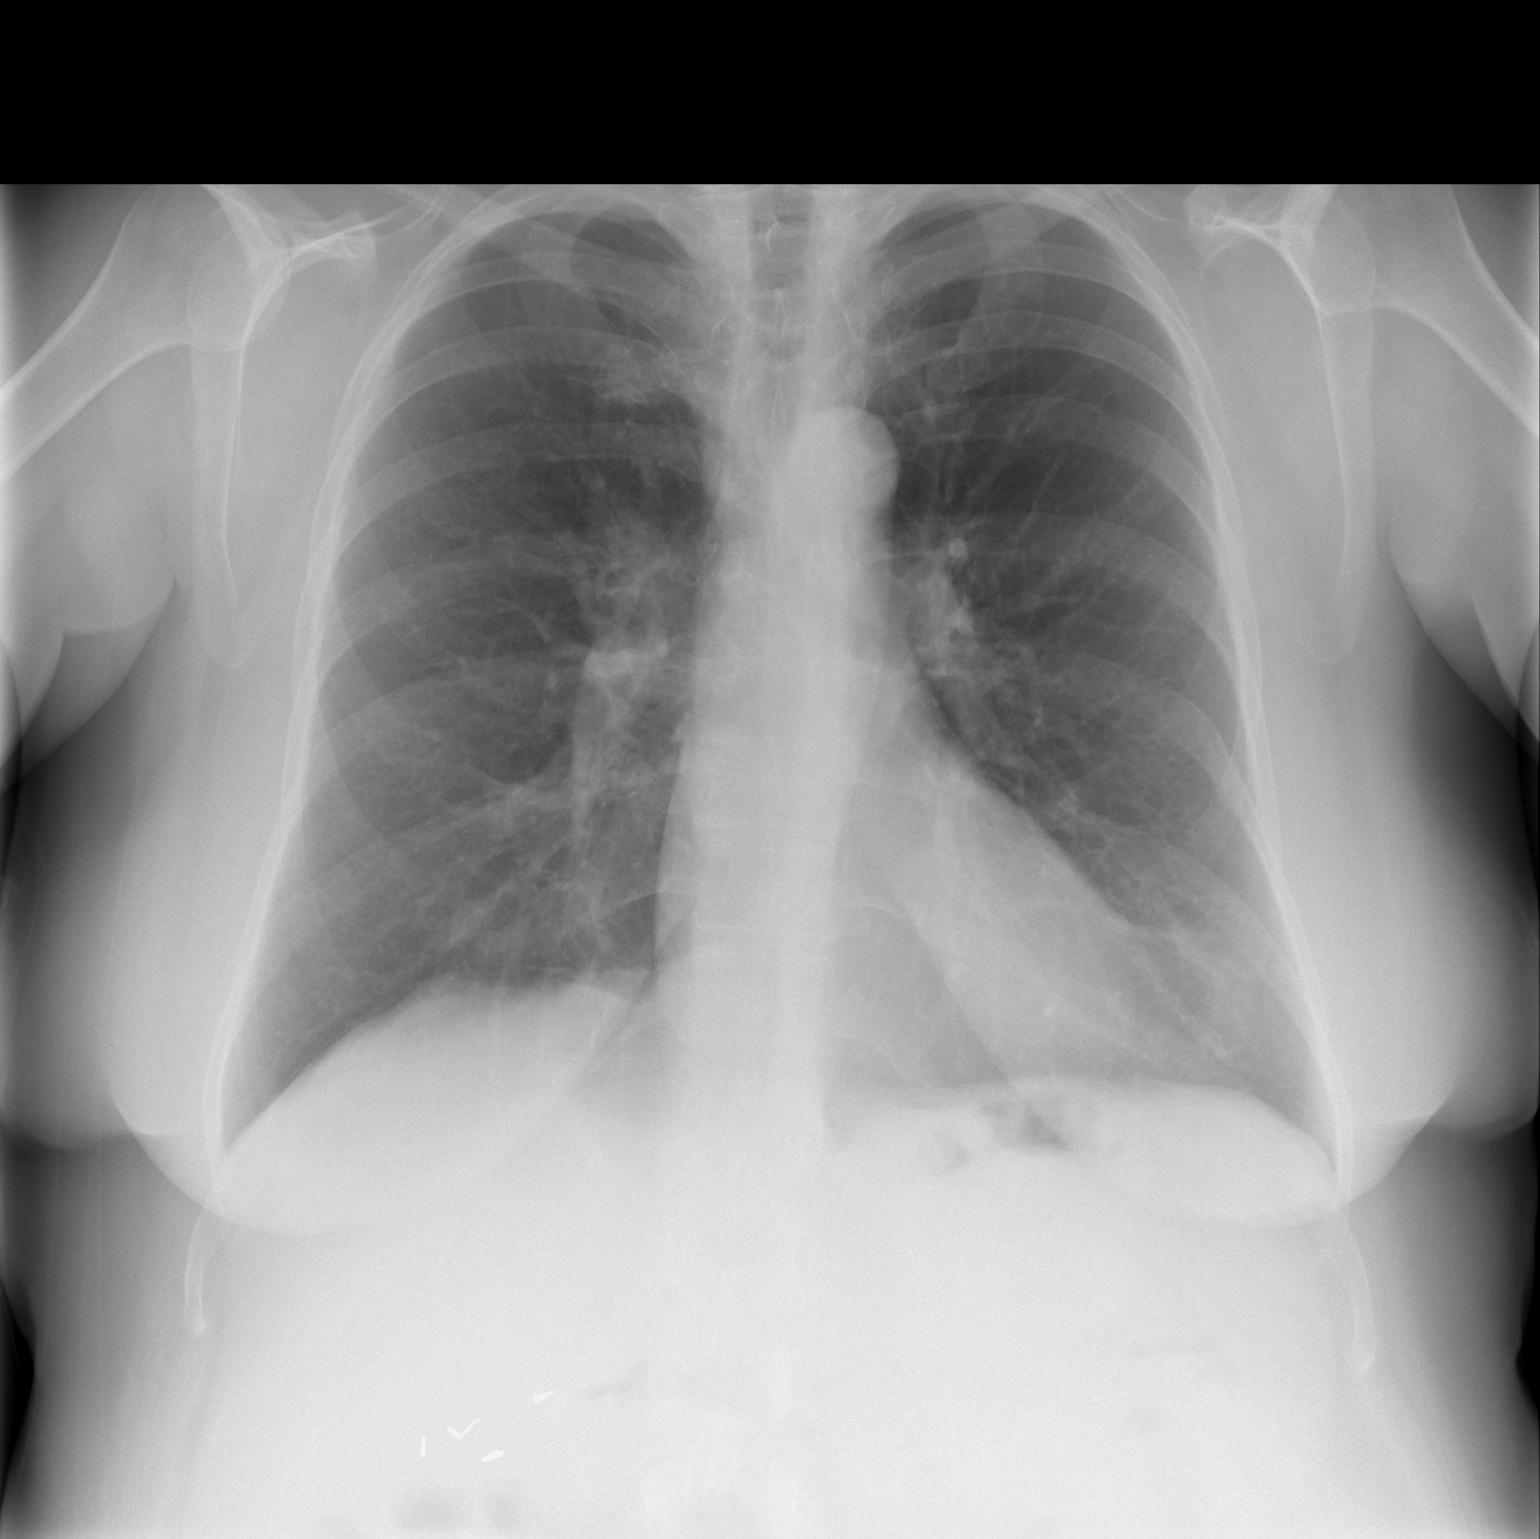

[w chest lat *]
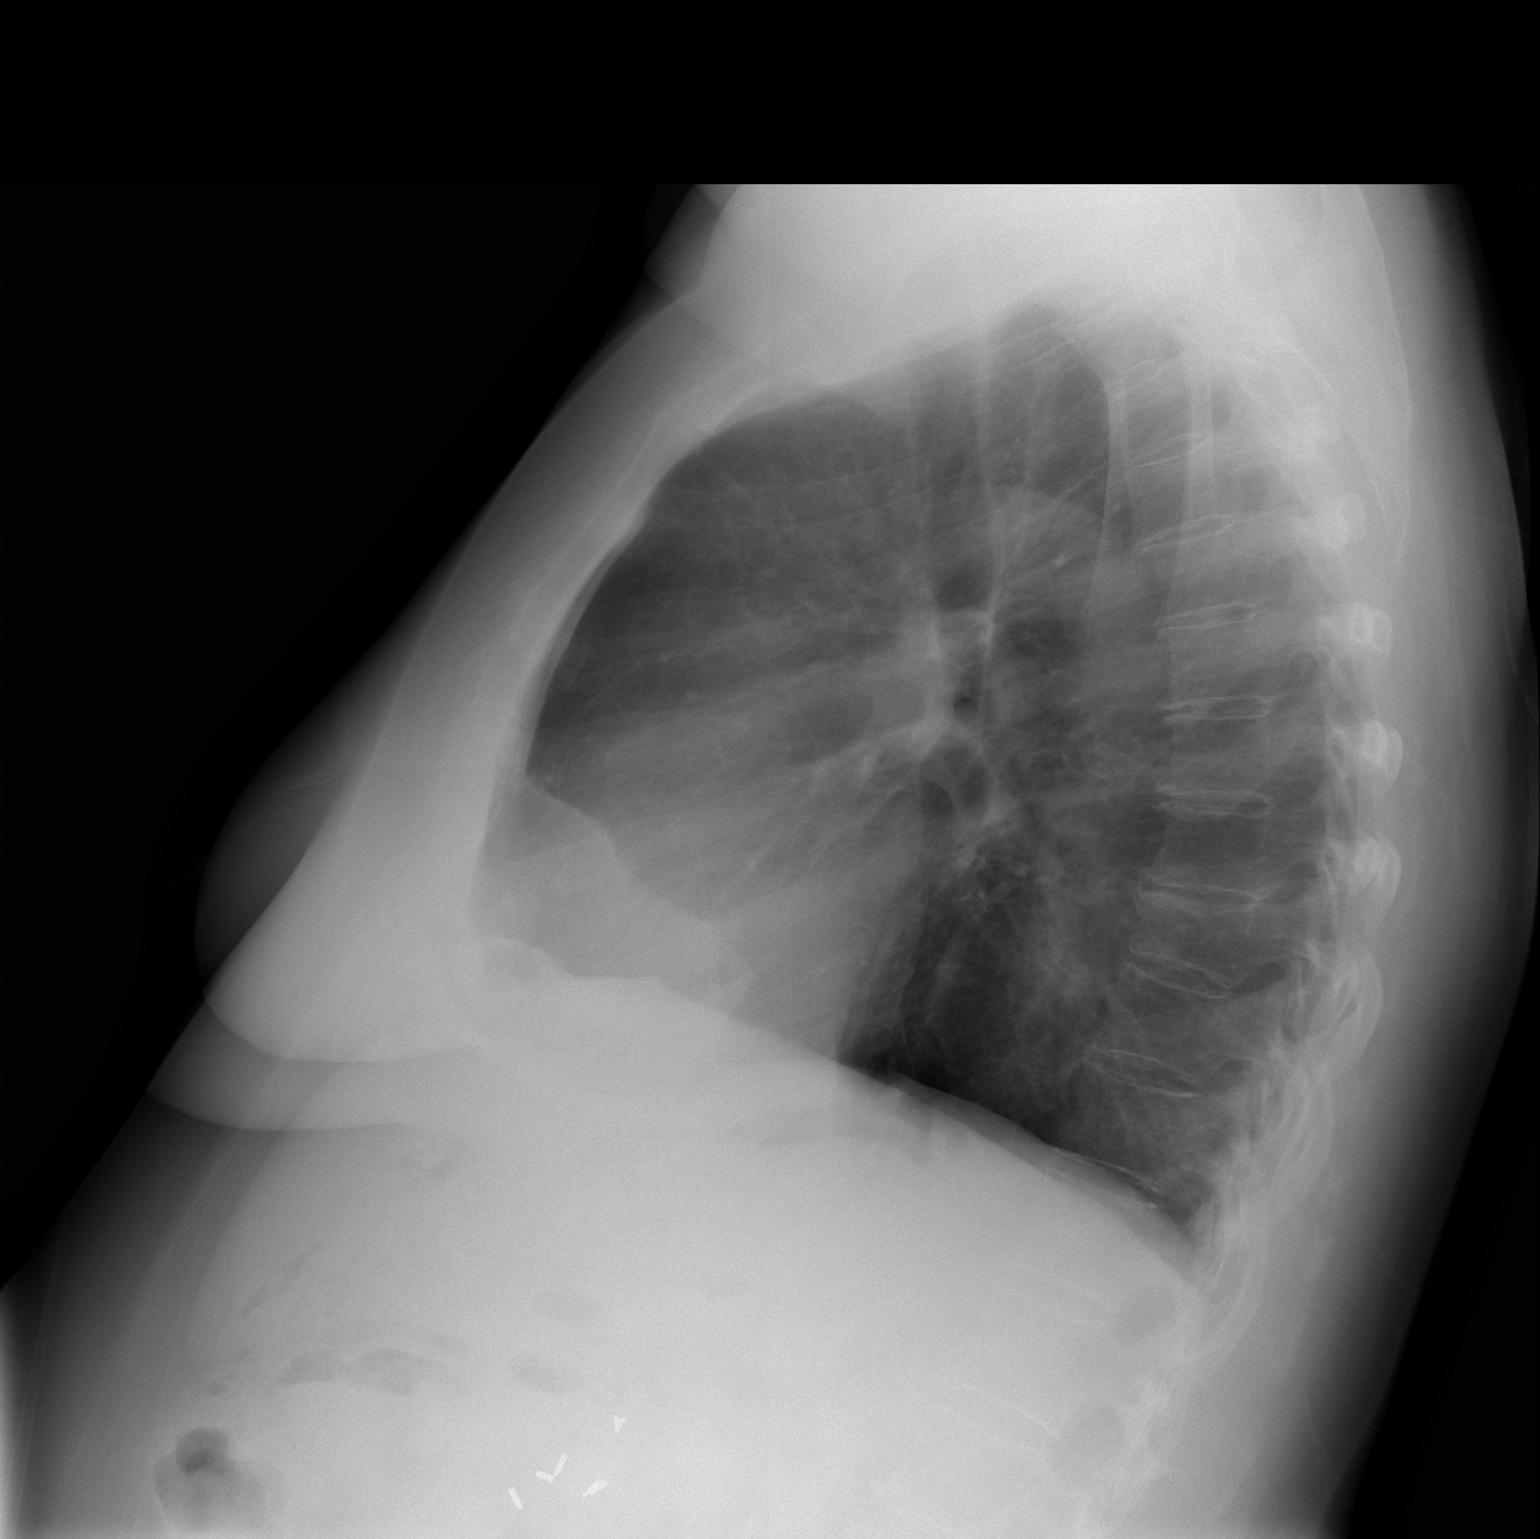

[2 of 2 positions shown; findings below may reference images not displayed]

FINDINGS: Midline trachea.   The heart size is normal.   Again demonstrated is mild bronchial wall thickening likely related to chronic smoking.  There is no evidence of acute pneumonia.  No pneumothorax or congestive failure.  Osseous structures are intact.
IMPRESSION: Stable chest.  Chronic bronchitis but no acute cardiopulmonary disease.

## 2006-02-08 ENCOUNTER — Ambulatory Visit: Payer: Self-pay | Admitting: Sports Medicine

## 2006-02-08 IMAGING — CR DG CHEST 1V PORT
1 series · 1 of 1 positions shown · non-contrast
Comparison: 01/01/2006

CLINICAL DATA: Shortness of breath

PORTABLE CHEST - 1 VIEW:

[view not recorded]
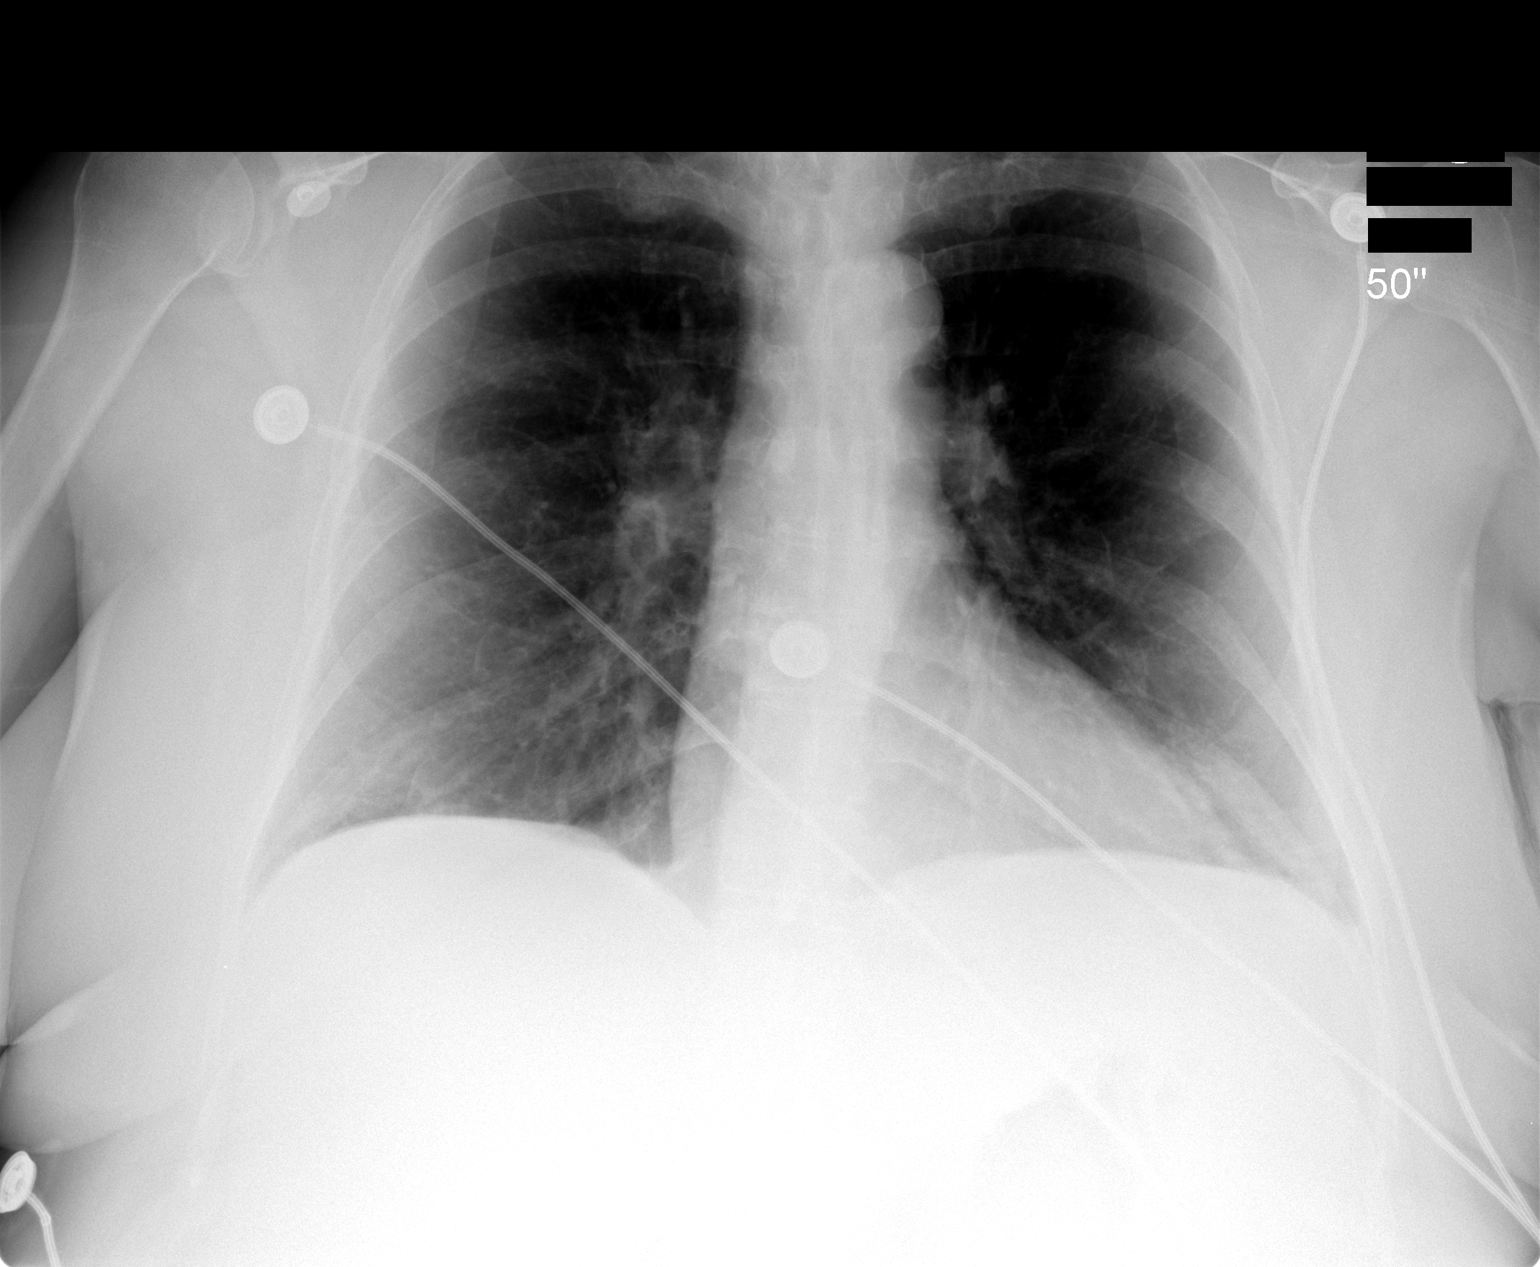

[1 of 1 positions shown; findings below may reference images not displayed]

FINDINGS: Heart and mediastinal contours are within normal limits. Stable mild
peribronchial thickening. No focal opacities or effusions. Visualized skeleton
unremarkable.
IMPRESSION: Stable mild chronic bronchitis. No acute findings.

## 2006-02-14 ENCOUNTER — Ambulatory Visit: Payer: Self-pay | Admitting: Family Medicine

## 2006-02-21 ENCOUNTER — Ambulatory Visit: Payer: Self-pay | Admitting: Family Medicine

## 2006-03-03 ENCOUNTER — Ambulatory Visit: Payer: Self-pay | Admitting: Family Medicine

## 2006-03-08 ENCOUNTER — Emergency Department (HOSPITAL_COMMUNITY): Admission: EM | Admit: 2006-03-08 | Discharge: 2006-03-08 | Payer: Self-pay | Admitting: Emergency Medicine

## 2006-03-10 ENCOUNTER — Emergency Department (HOSPITAL_COMMUNITY): Admission: EM | Admit: 2006-03-10 | Discharge: 2006-03-10 | Payer: Self-pay | Admitting: Emergency Medicine

## 2006-03-11 ENCOUNTER — Ambulatory Visit: Payer: Self-pay | Admitting: Family Medicine

## 2006-03-15 ENCOUNTER — Emergency Department (HOSPITAL_COMMUNITY): Admission: EM | Admit: 2006-03-15 | Discharge: 2006-03-15 | Payer: Self-pay | Admitting: Emergency Medicine

## 2006-03-16 ENCOUNTER — Ambulatory Visit: Payer: Self-pay | Admitting: Family Medicine

## 2006-04-01 ENCOUNTER — Ambulatory Visit: Payer: Self-pay | Admitting: Family Medicine

## 2006-04-10 IMAGING — CR DG CHEST 2V
2 series · 2 of 2 positions shown · non-contrast
Comparison: 01/06/06.

CLINICAL DATA: Chest pain and shortness of breath; emphysema and diabetes. 
 CHEST ? 2 VIEW:

[w chest pa]
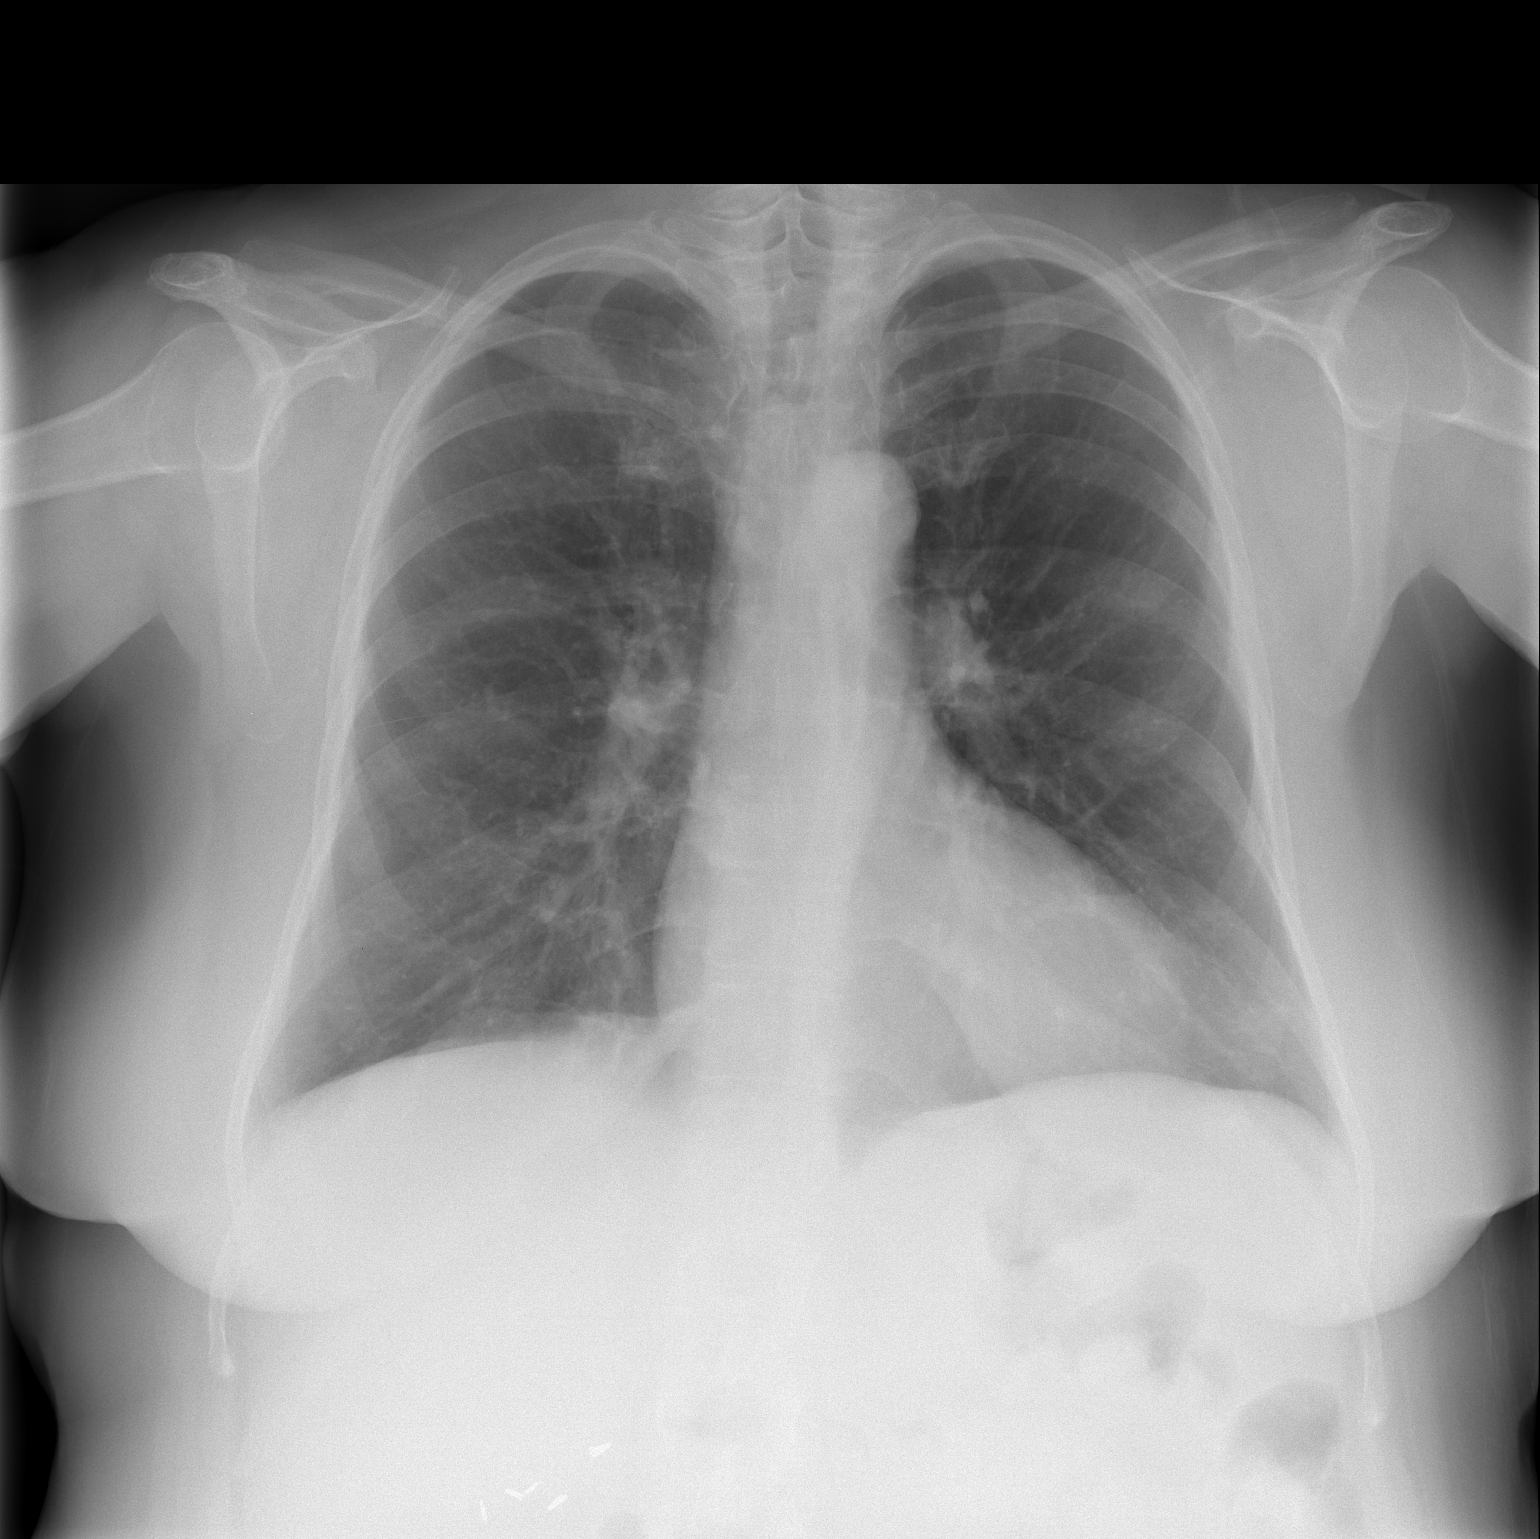

[w chest lat]
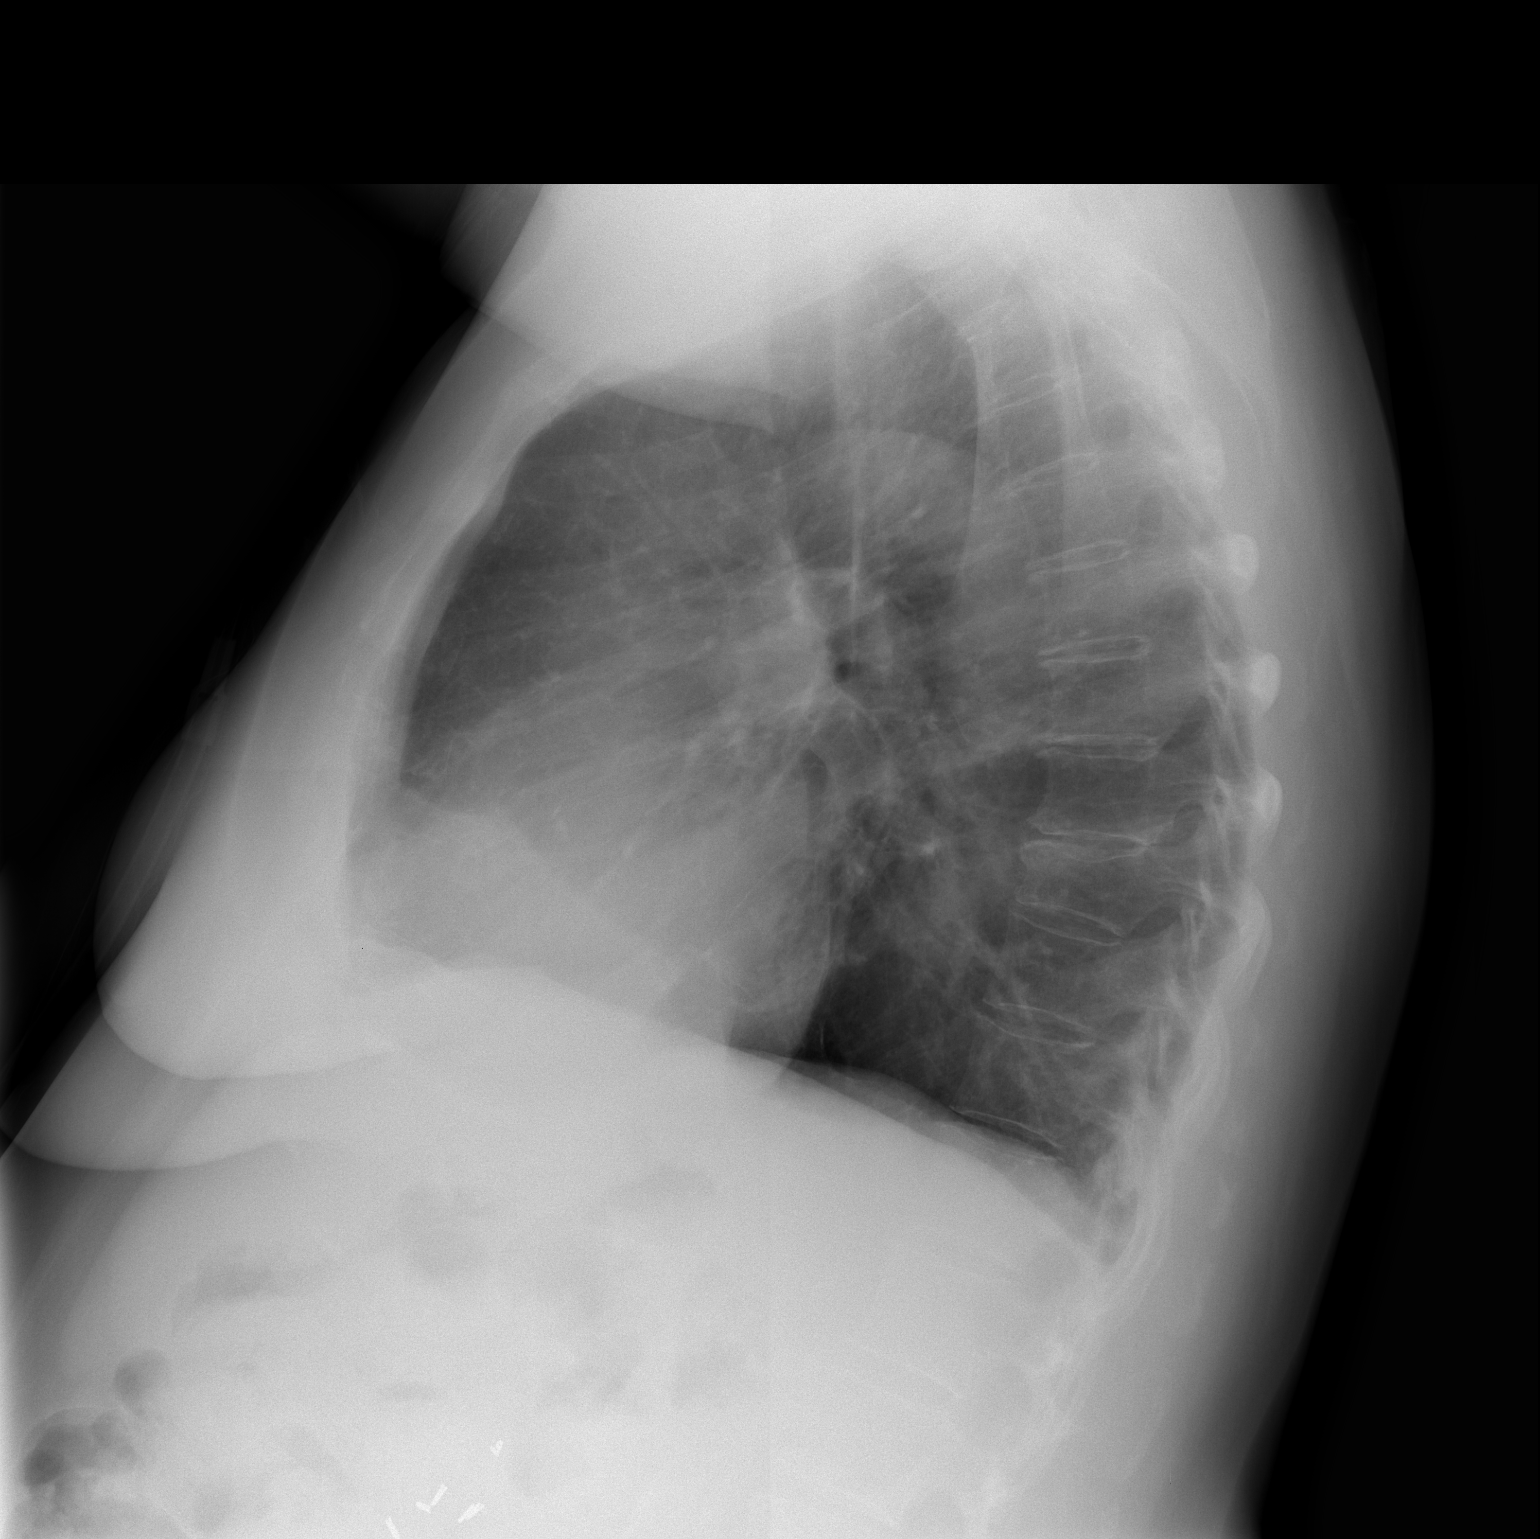

[2 of 2 positions shown; findings below may reference images not displayed]

FINDINGS: The cardiac silhouette, mediastinum, and pulmonary vasculature remain within normal limits.  Both lungs are clear.  The bones are osteopenic, and there is a stable anterior wedge compression fracture of a mid thoracic vertebral body.
IMPRESSION: Stable chest x-ray with no evidence of acute cardiac or pulmonary process.

## 2006-04-11 ENCOUNTER — Ambulatory Visit: Payer: Self-pay | Admitting: Family Medicine

## 2006-04-12 IMAGING — CR DG CHEST 1V PORT
1 series · 1 of 1 positions shown · non-contrast
Comparison: none

CLINICAL DATA: Chest pain.  Shortness of breath.  Diabetic.  Hypertension.  Reformed smoker.
 PORTABLE CHEST, ONE VIEW ? 03/10/2006:

[view not recorded]
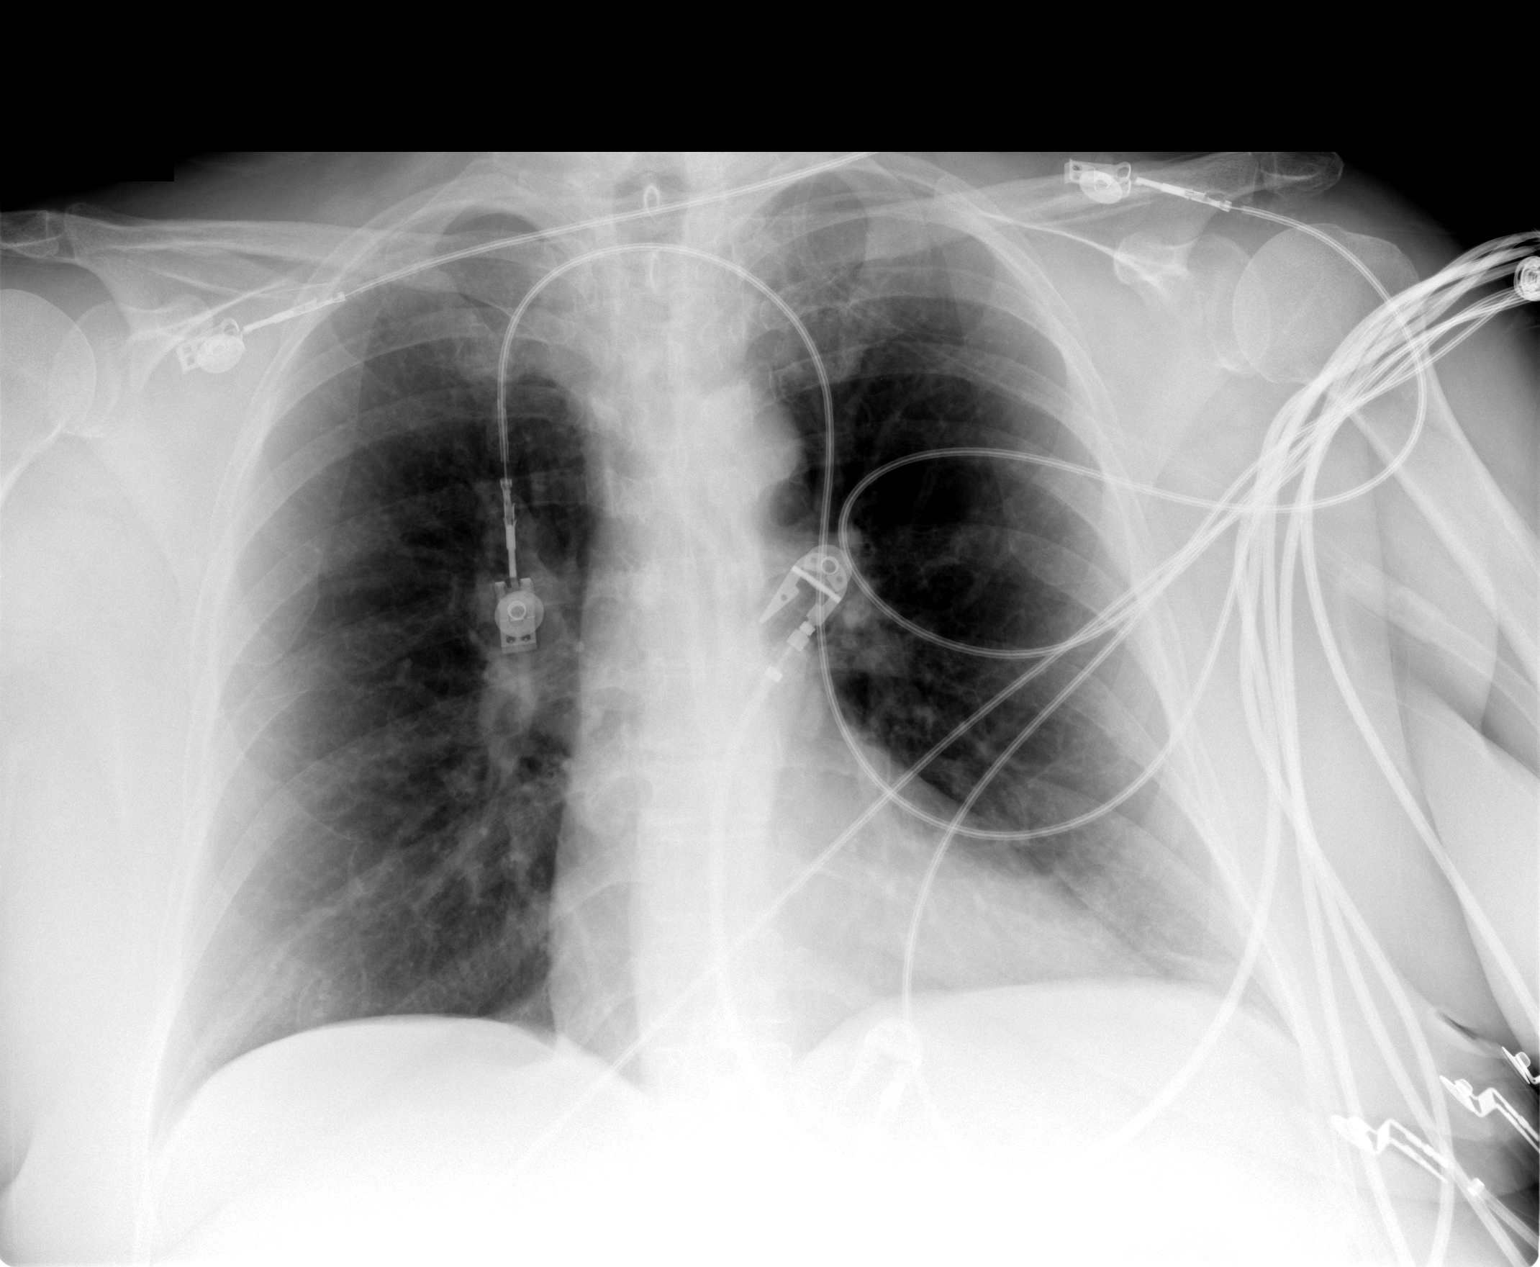

[1 of 1 positions shown; findings below may reference images not displayed]

FINDINGS: An AP semierect portable film of the chest made 03/10/2006 at 4034 hours is compared to previous routine studies of 03/08/2006 and show mild left basilar atelectasis.  The heart, mediastinum, bony thorax and soft tissues are normal.  There is mild stable diffuse peribronchial thickening.
IMPRESSION: Mild left basilar atelectasis.  Mild stable diffuse peribronchial thickening.  No evidence of active cardiopulmonary disease.

## 2006-04-15 ENCOUNTER — Ambulatory Visit: Payer: Self-pay | Admitting: Family Medicine

## 2006-04-17 ENCOUNTER — Emergency Department (HOSPITAL_COMMUNITY): Admission: EM | Admit: 2006-04-17 | Discharge: 2006-04-17 | Payer: Self-pay | Admitting: Emergency Medicine

## 2006-04-21 ENCOUNTER — Emergency Department (HOSPITAL_COMMUNITY): Admission: EM | Admit: 2006-04-21 | Discharge: 2006-04-21 | Payer: Self-pay | Admitting: Emergency Medicine

## 2006-04-22 ENCOUNTER — Emergency Department (HOSPITAL_COMMUNITY): Admission: EM | Admit: 2006-04-22 | Discharge: 2006-04-22 | Payer: Self-pay | Admitting: Emergency Medicine

## 2006-04-26 ENCOUNTER — Ambulatory Visit: Payer: Self-pay | Admitting: Family Medicine

## 2006-04-29 ENCOUNTER — Ambulatory Visit: Payer: Self-pay | Admitting: Family Medicine

## 2006-05-01 ENCOUNTER — Emergency Department (HOSPITAL_COMMUNITY): Admission: EM | Admit: 2006-05-01 | Discharge: 2006-05-01 | Payer: Self-pay | Admitting: Emergency Medicine

## 2006-05-06 ENCOUNTER — Ambulatory Visit: Payer: Self-pay | Admitting: Family Medicine

## 2006-05-12 ENCOUNTER — Ambulatory Visit: Payer: Self-pay | Admitting: Sports Medicine

## 2006-05-20 IMAGING — CR DG CERVICAL SPINE COMPLETE 4+V
8 of 9 series · 8 of 9 positions shown · non-contrast
Comparison: None.

CLINICAL DATA: Fall.  Neck pain.
 CERVICAL SPINE - 5 VIEW:

[t c-spine a.p.]
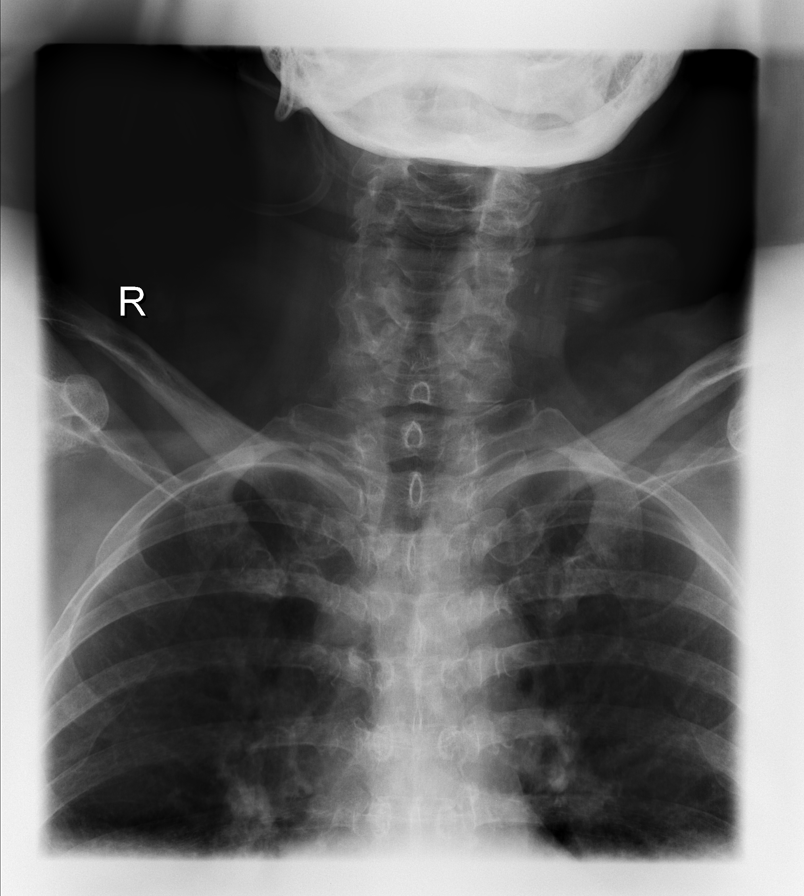

[t c-spine odontoid]
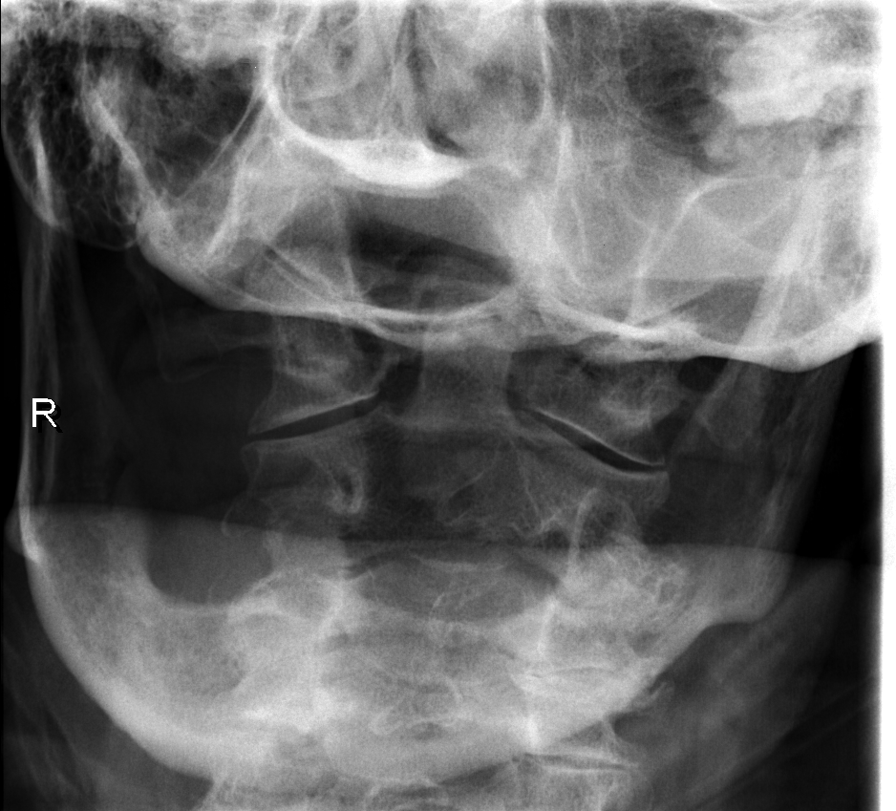

[t c-spine a.p. *]
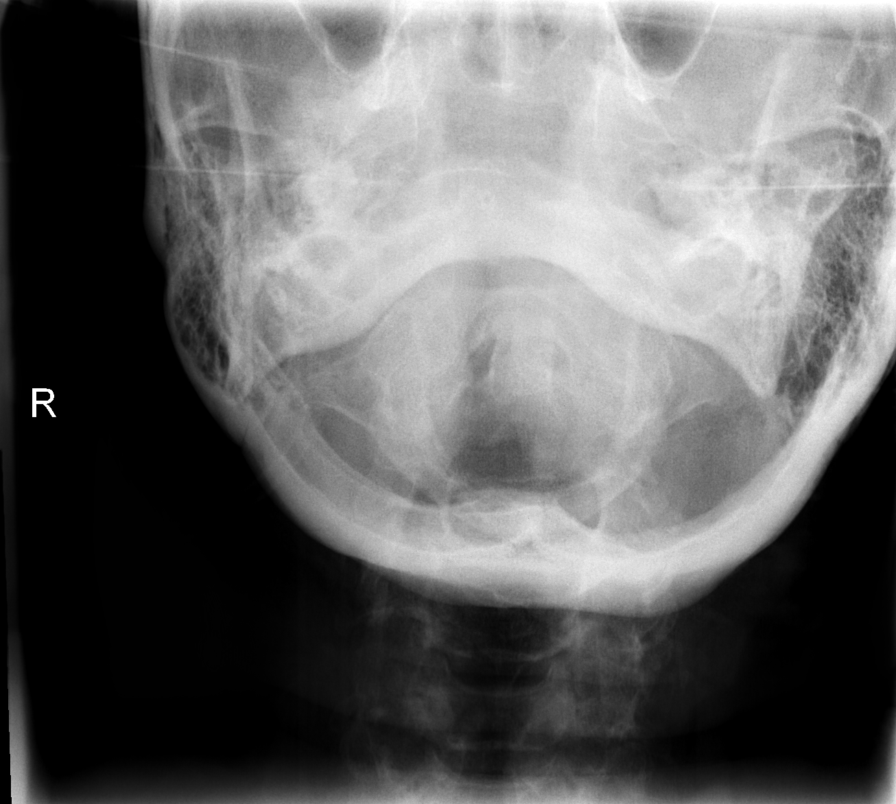

[t c-spine oblique * (1 of 2)]
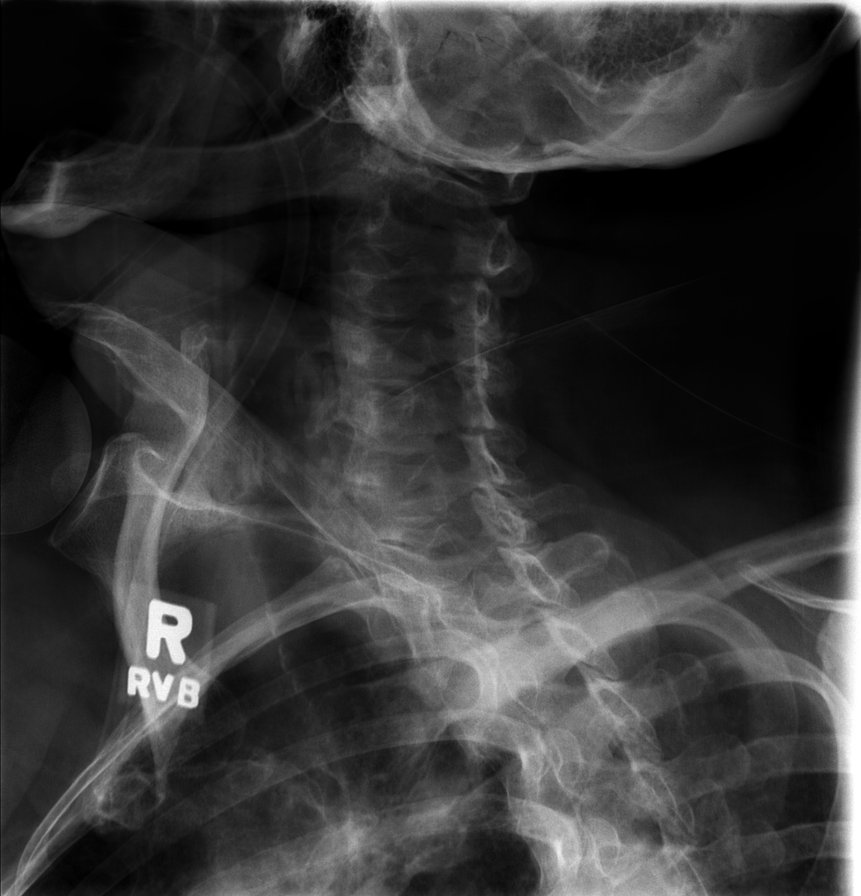

[t c-spine oblique * (2 of 2)]
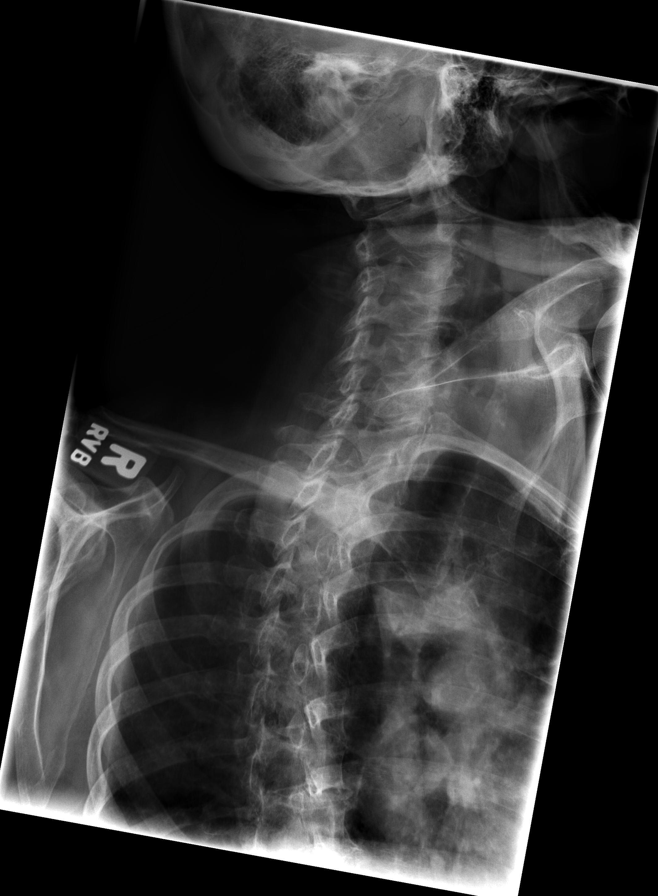

[t swimmers * (1 of 2)]
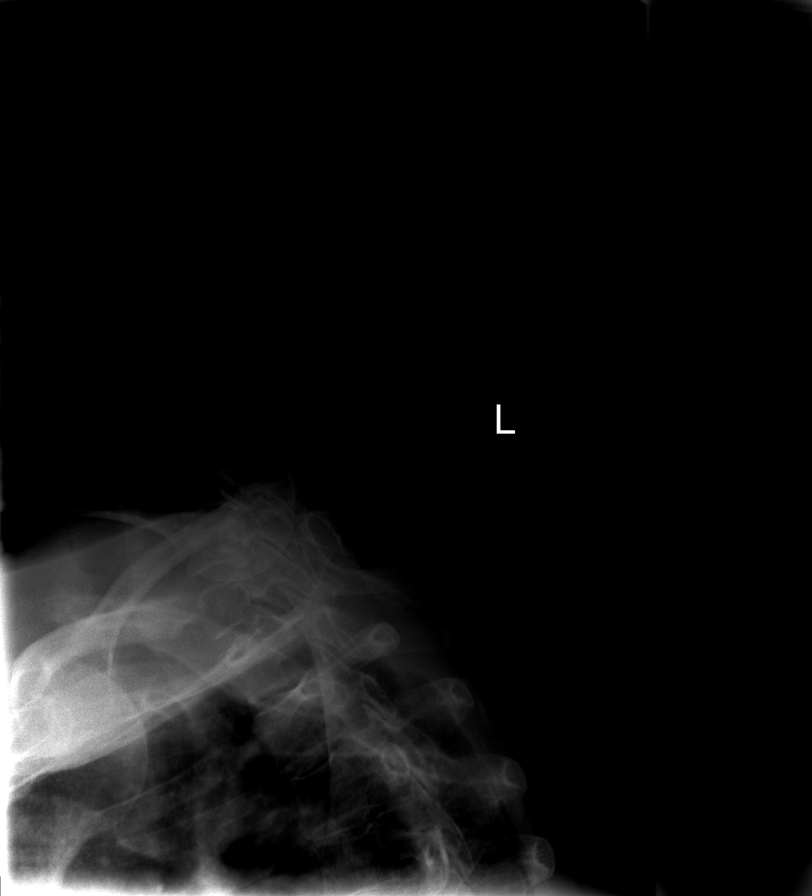

[t swimmers * (2 of 2)]
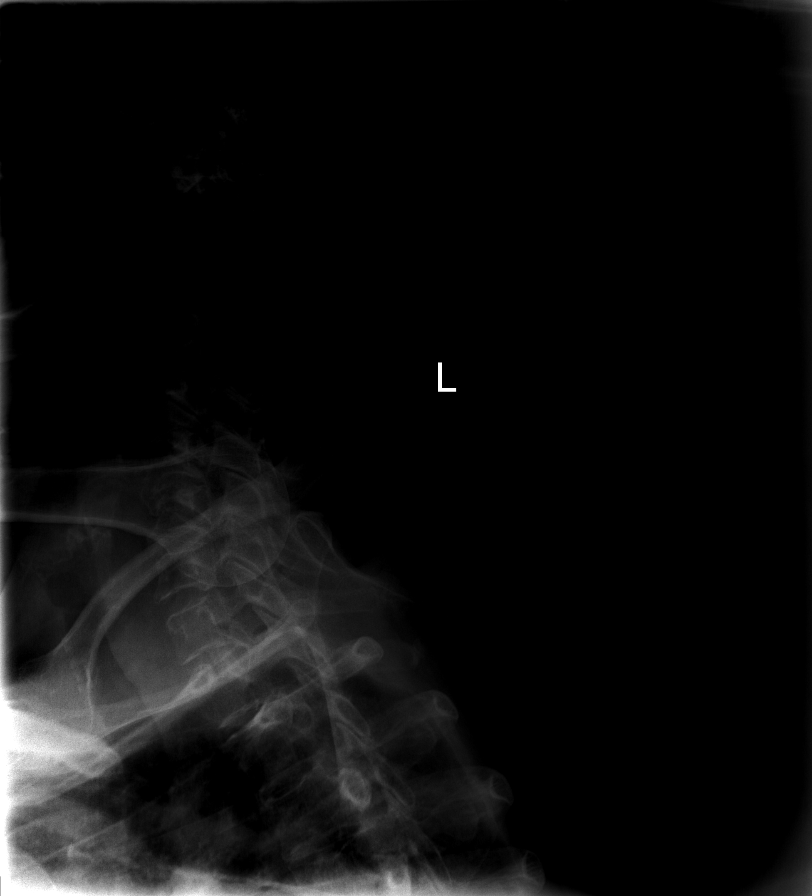

[w c-spine lat *]
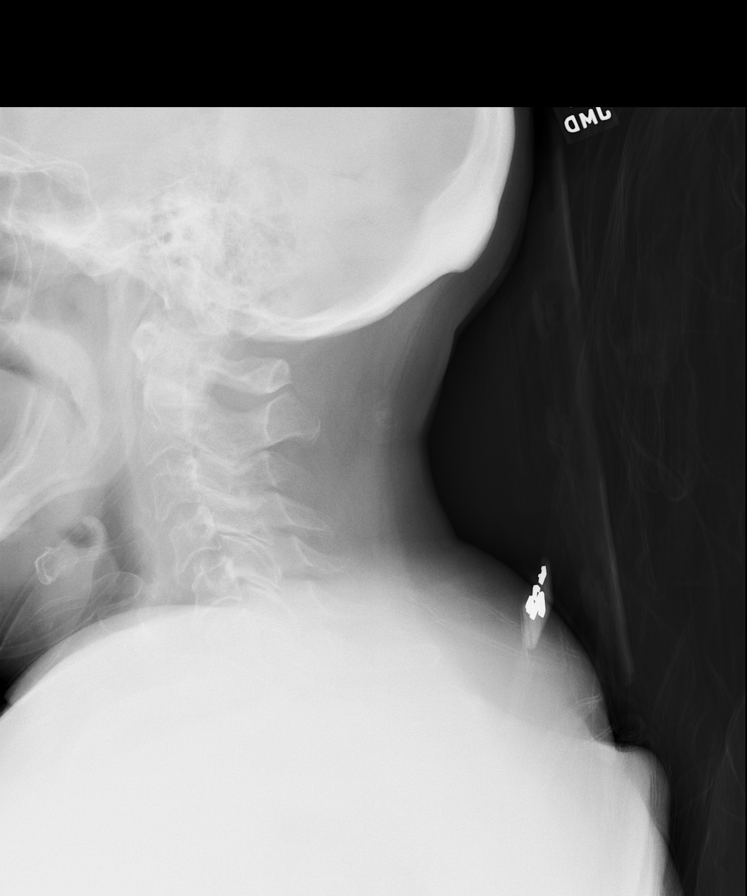

[8 of 9 positions shown; findings below may reference images not displayed]

The study is limited by osteopenia, positioning, and technique.  There is no gross fracture or dislocation from the skull base to the C6 vertebral body.  C7 and T1 are not reliably evaluated on the lateral films.  
 Oblique films show patent neural foramina bilaterally.  No evidence for prevertebral soft tissue swelling.
IMPRESSION: No evidence for acute fracture or subluxation of the cervical spine from the skull base to the C6 vertebral body.  C6 to T1 is not reliably evaluated.  If this area cannot be cleared clinically, CT scanning is recommended.

## 2006-05-20 IMAGING — CR DG ELBOW COMPLETE 3+V*L*
4 series · 4 of 4 positions shown · non-contrast
Comparison: none

CLINICAL DATA: Left elbow pain.  Injury. 
 LEFT ELBOW - 4 VIEW: 
 There is no evidence for acute fracture or joint effusion.  The patient was unresponsive and positioning somewhat limits the study.  There is spurring from both epicondyles and the coronoid process.

[view not recorded (1 of 4)]
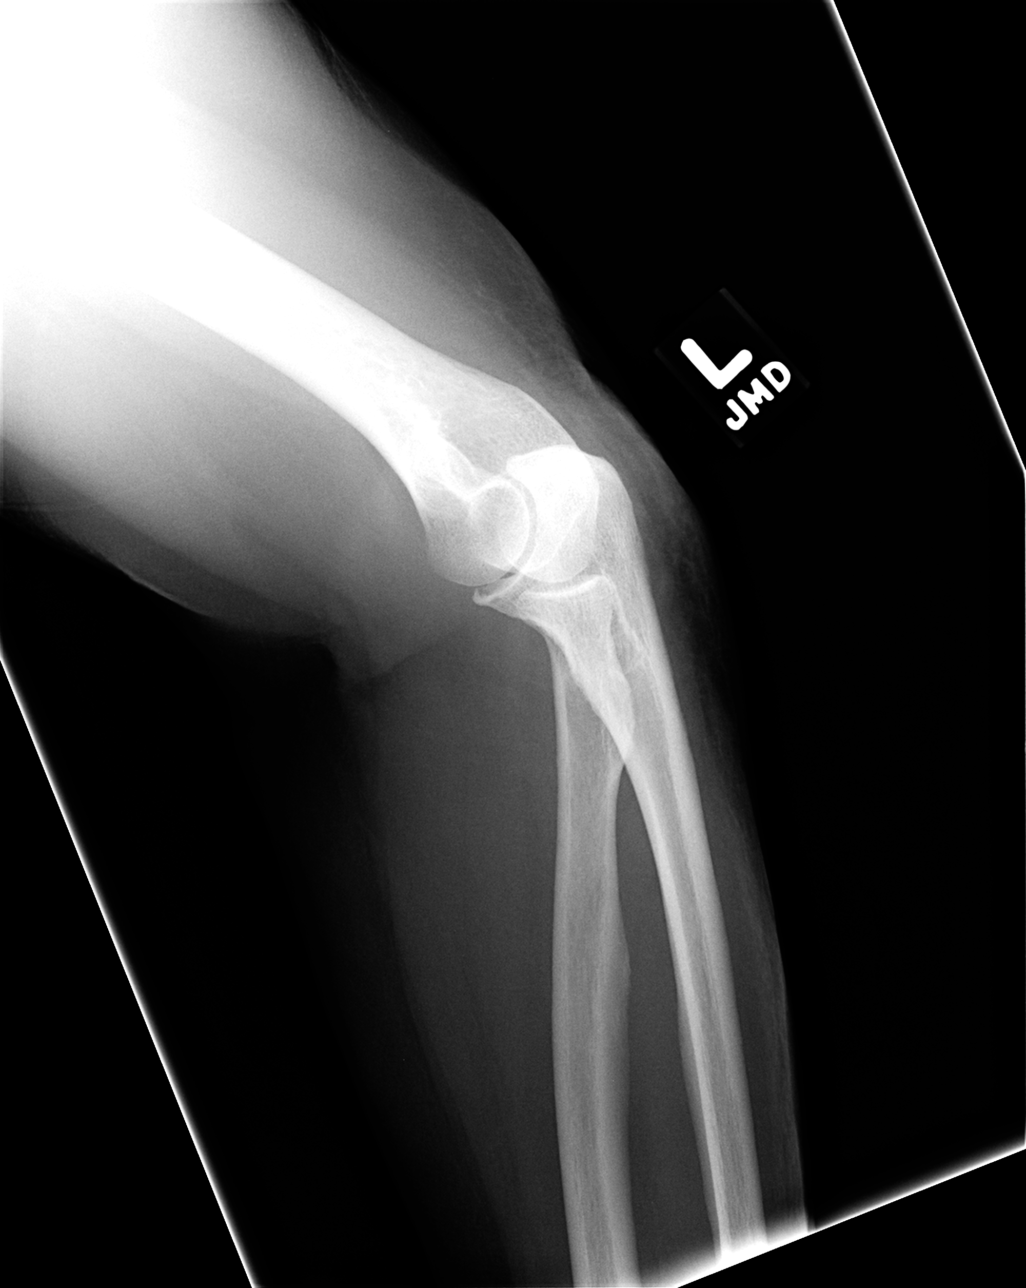

[view not recorded (2 of 4)]
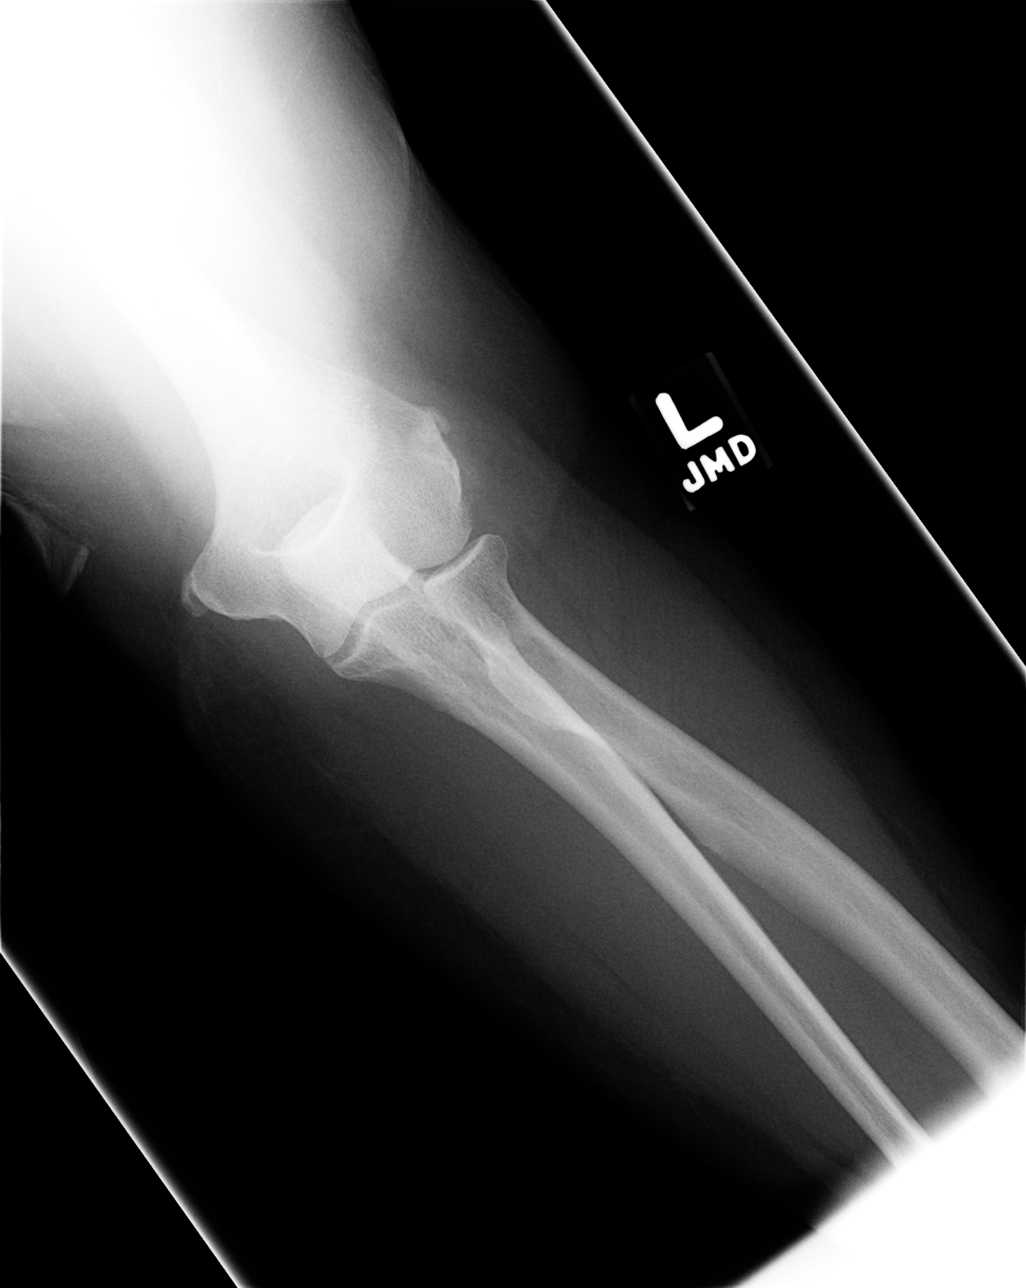

[view not recorded (3 of 4)]
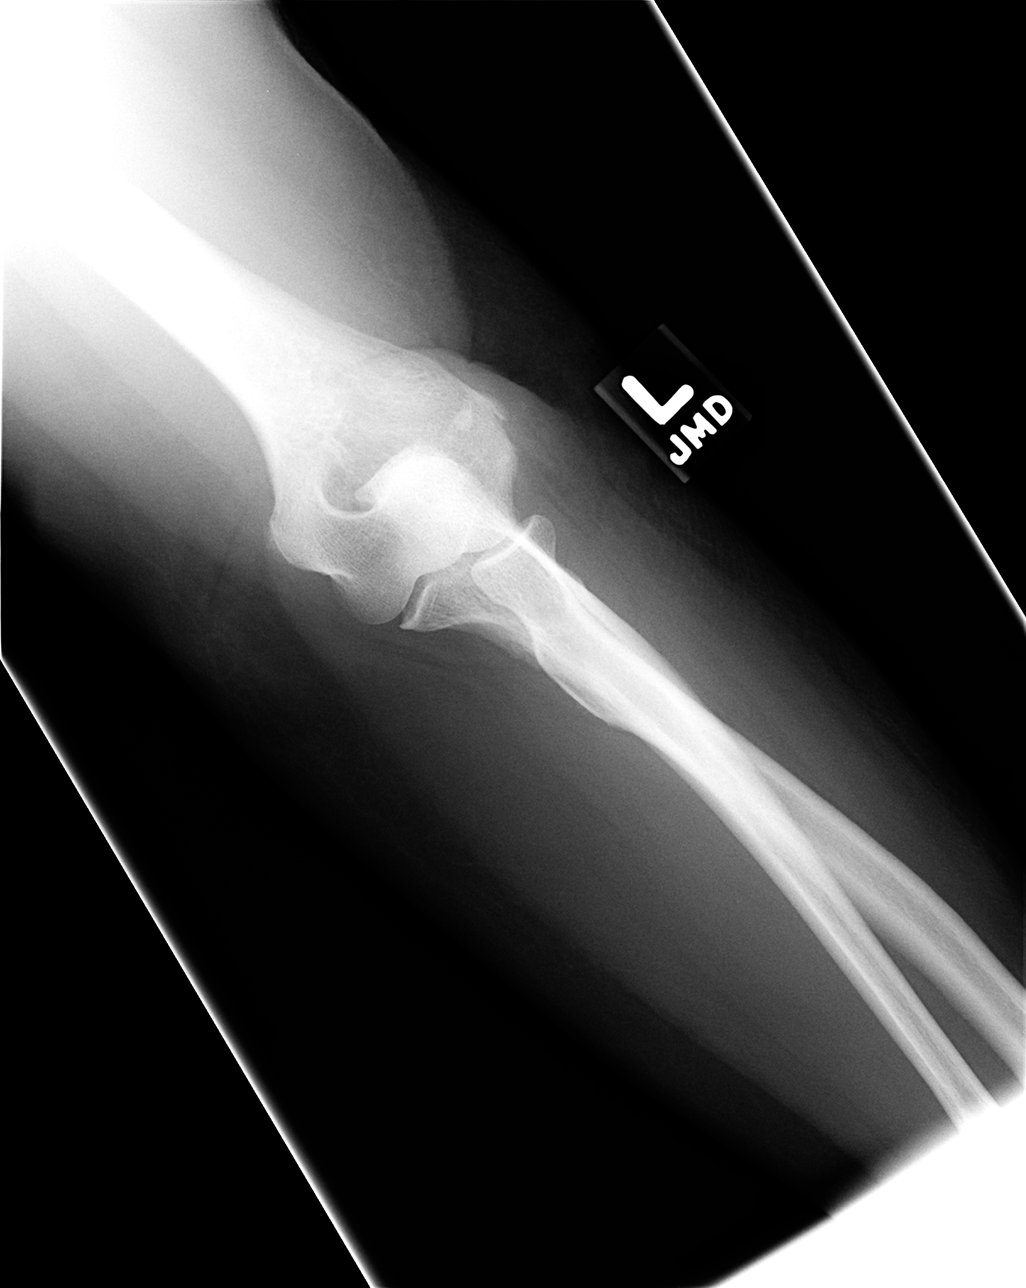

[view not recorded (4 of 4)]
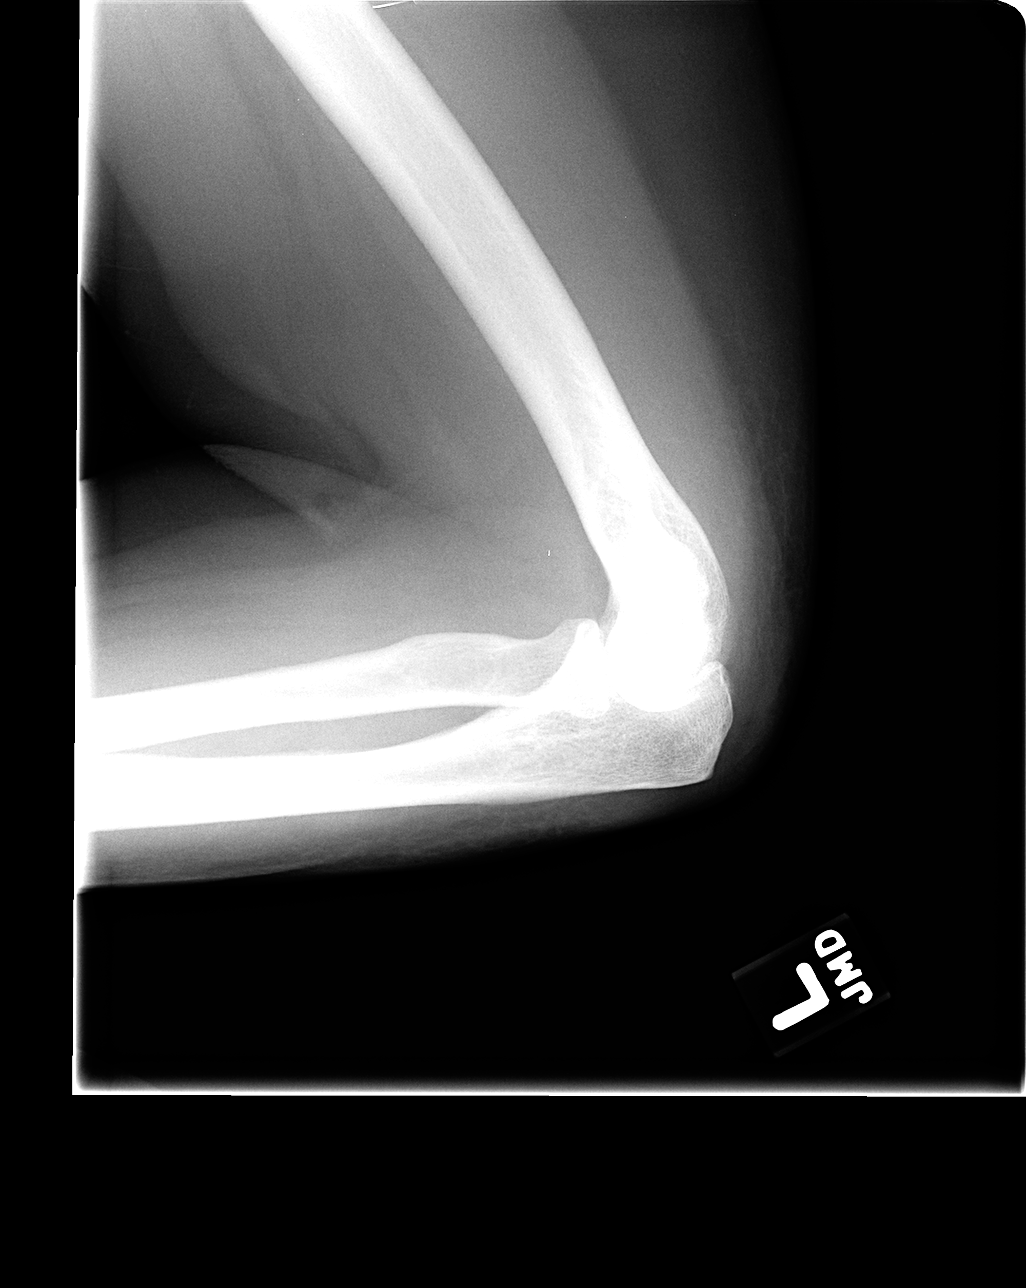

[4 of 4 positions shown; findings below may reference images not displayed]

IMPRESSION: No acute bony abnormality or evidence of joint effusion.

## 2006-05-20 IMAGING — CR DG LUMBAR SPINE COMPLETE 4+V
6 series · 6 of 6 positions shown · non-contrast
Comparison: None.

CLINICAL DATA: Fall.  Back pain.  
 LUMBAR SPINE - 4 VIEW:

[t l-spine a.p.]
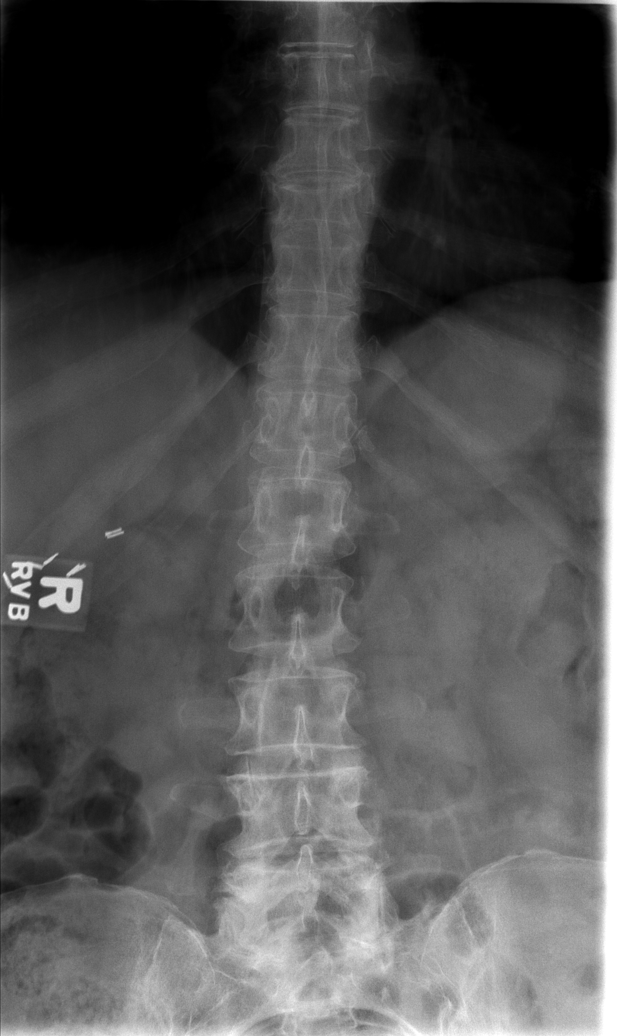

[t l-spine oblique exposure *]
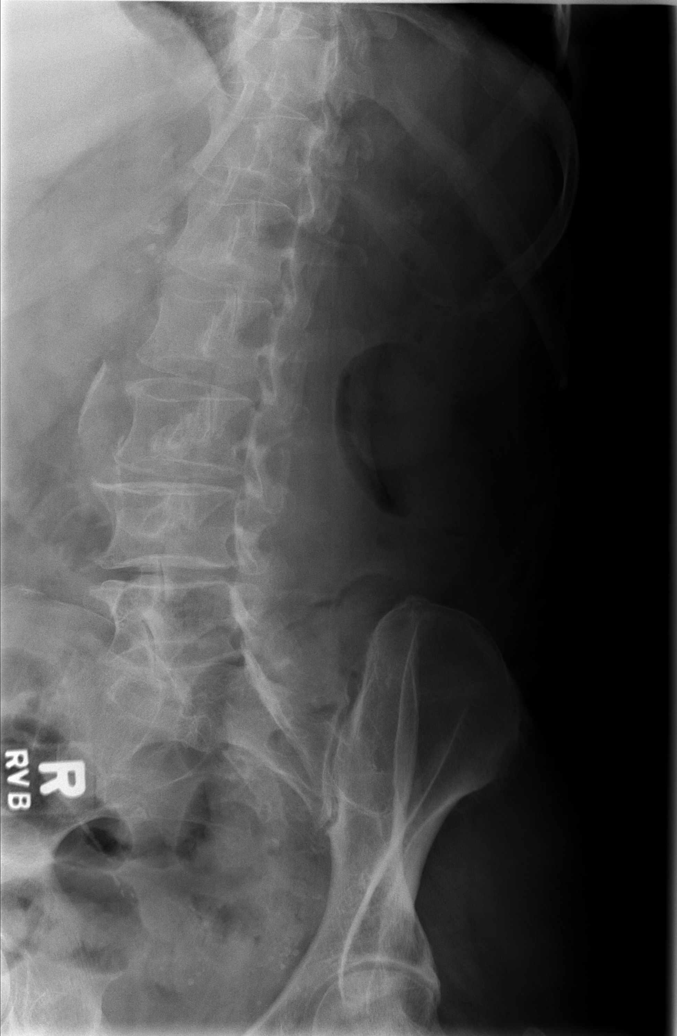

[t l-spine oblique exposure]
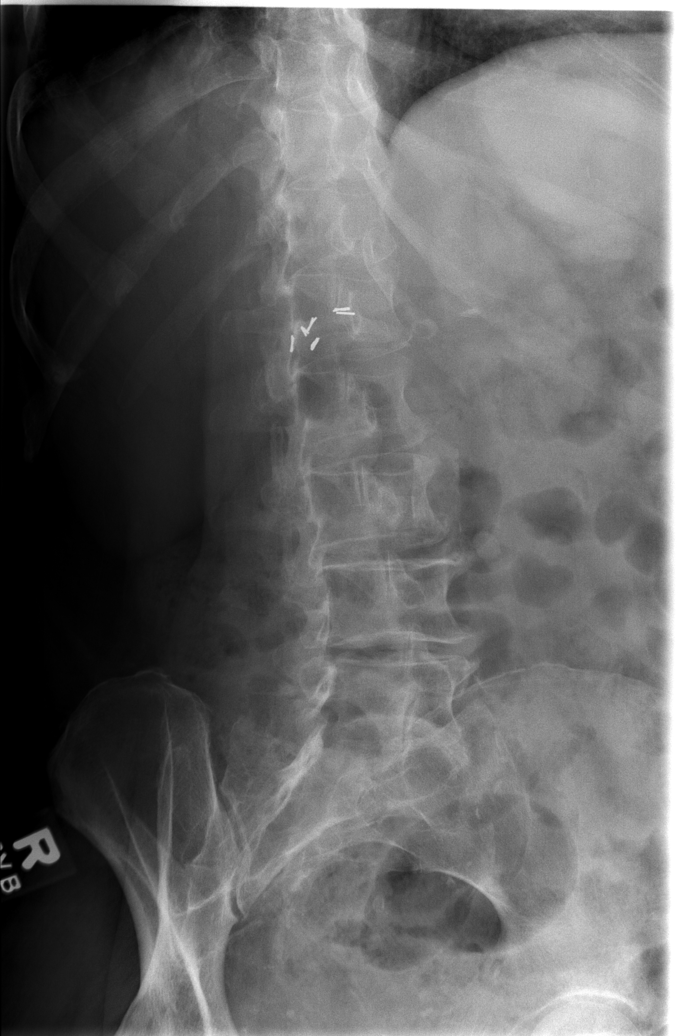

[t l-spine lat (1 of 2)]
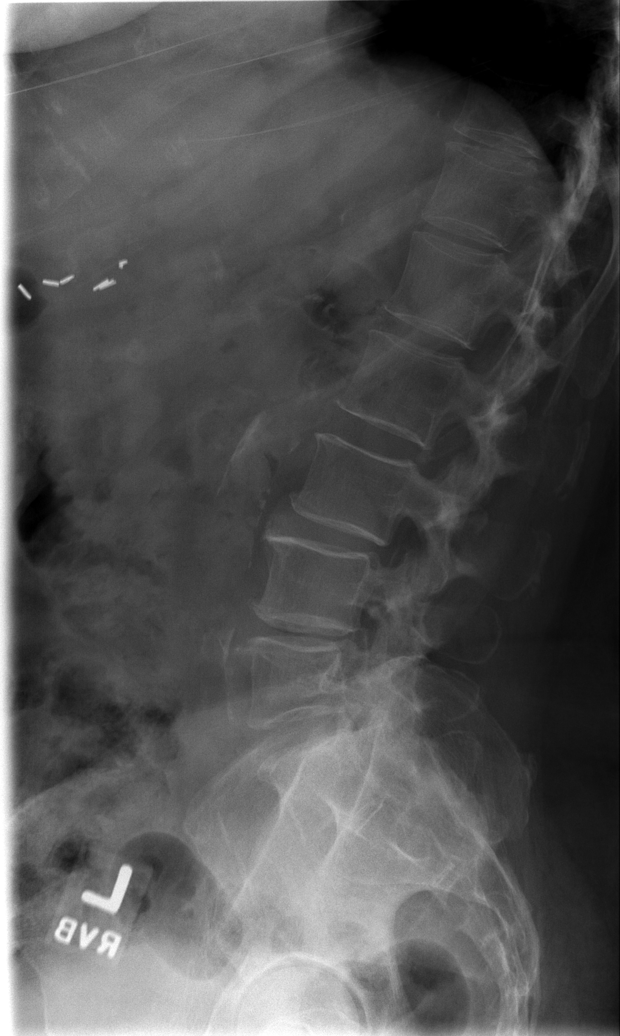

[t l-spine lat (2 of 2)]
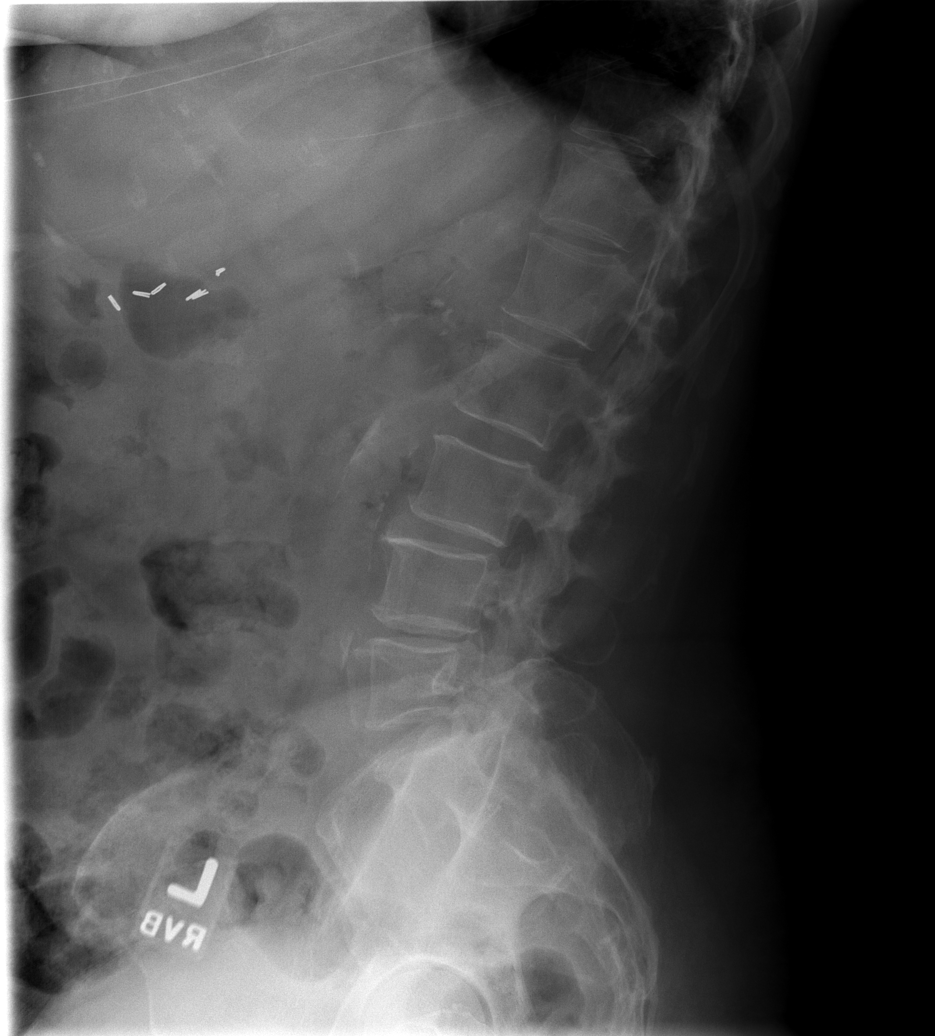

[t l-spine l5-s1 spot]
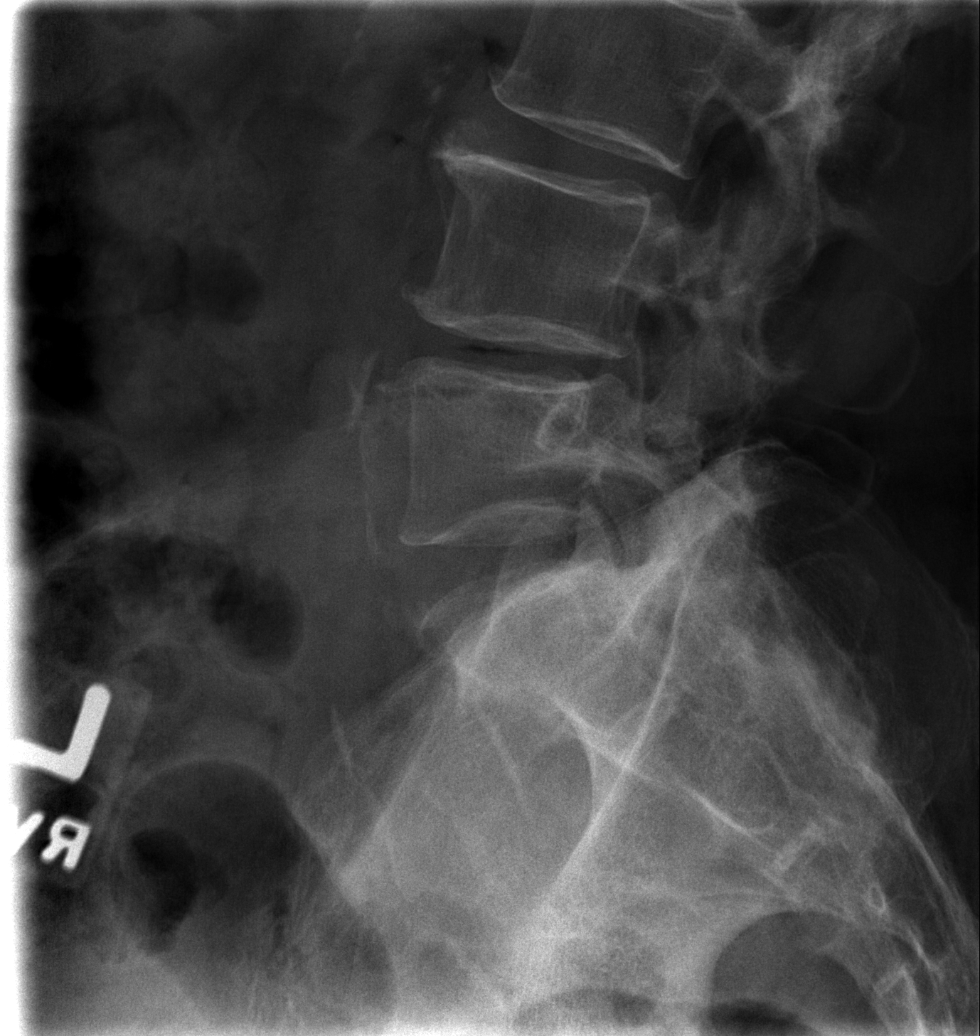

[6 of 6 positions shown; findings below may reference images not displayed]

Mild straightening of the normal lumbar lordosis.  There is loss of disk height at L4-5 and to a lesser degree at L3.  Multilevel endplate spurring noted.  Although not clearly seen on the oblique films, the trapezoidal shape of the L5 vertebral body raises the question of par interarticularis defect.  There is lower lumbar facet arthropathy.  Calcification noted in the wall of the aorta.
IMPRESSION: No acute bony abnormality.

## 2006-05-20 IMAGING — CR DG KNEE COMPLETE 4+V*L*
4 series · 4 of 4 positions shown · non-contrast
Comparison: None.

CLINICAL DATA: Fall with left knee bruising.
 LEFT KNEE - 4 VIEW:

[t knee oblique left (1 of 2)]
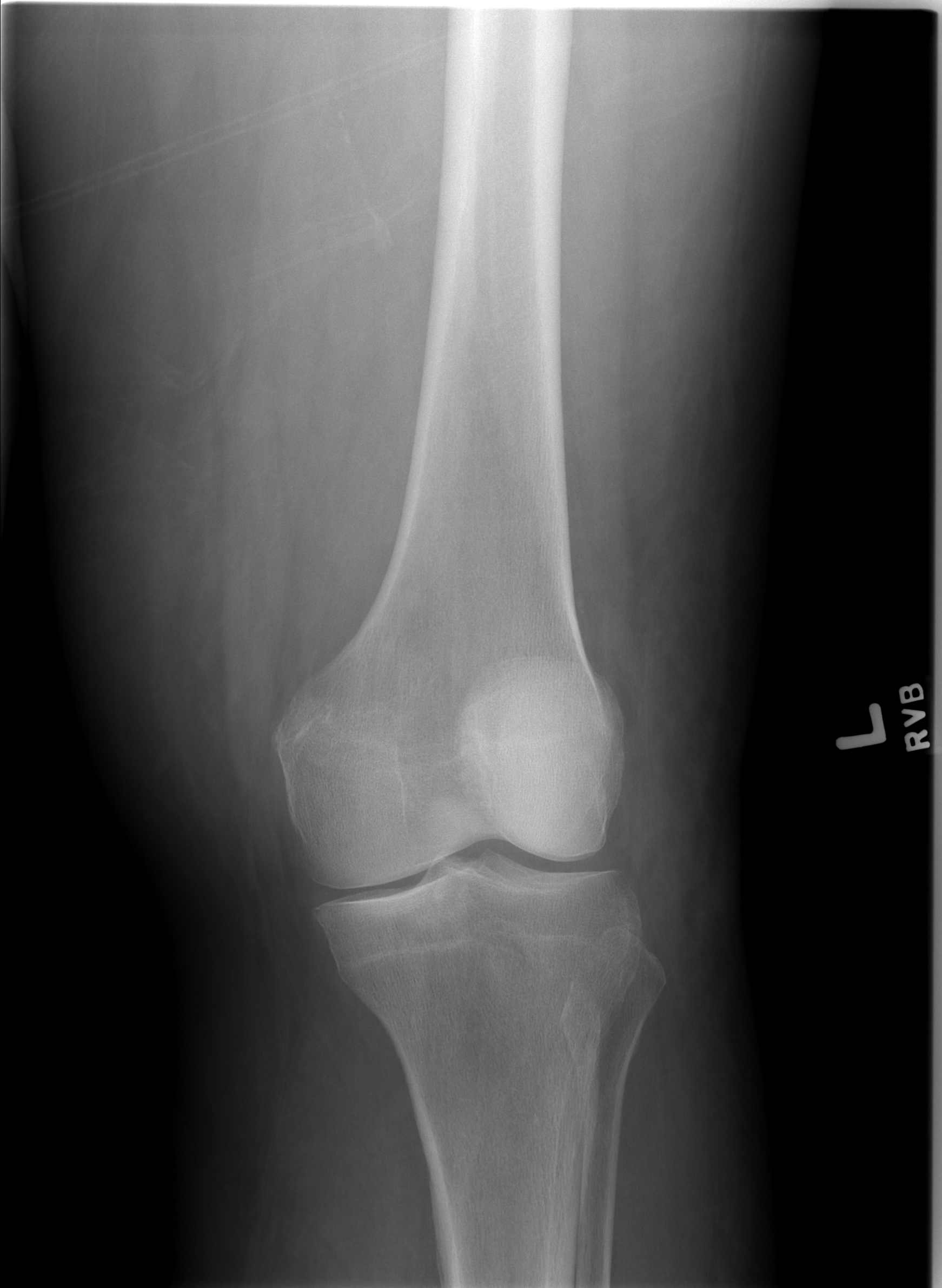

[t knee ap left]
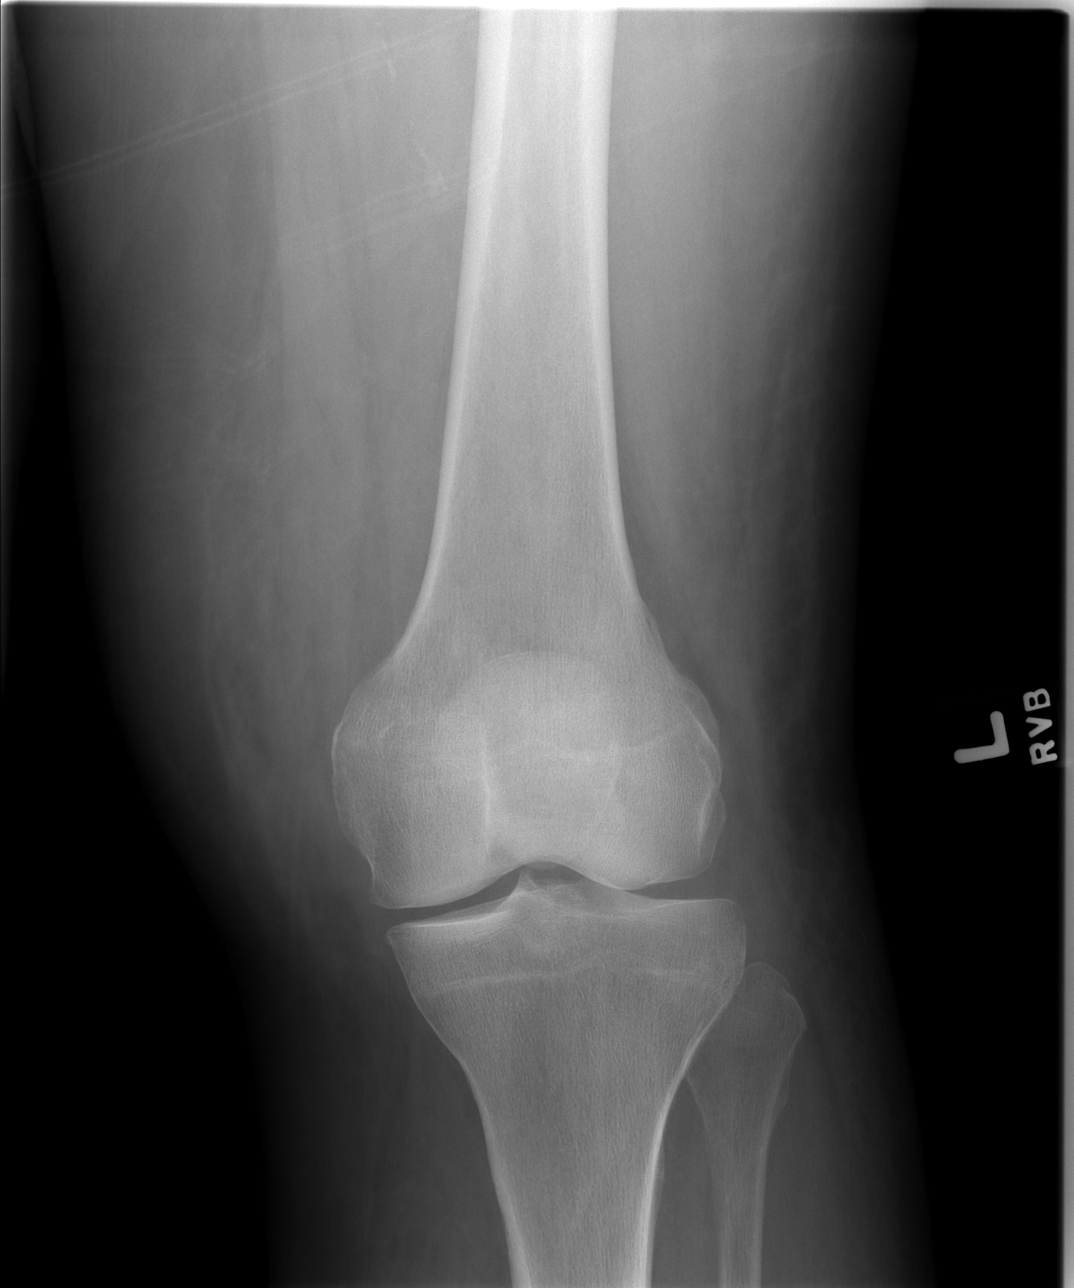

[t knee oblique left (2 of 2)]
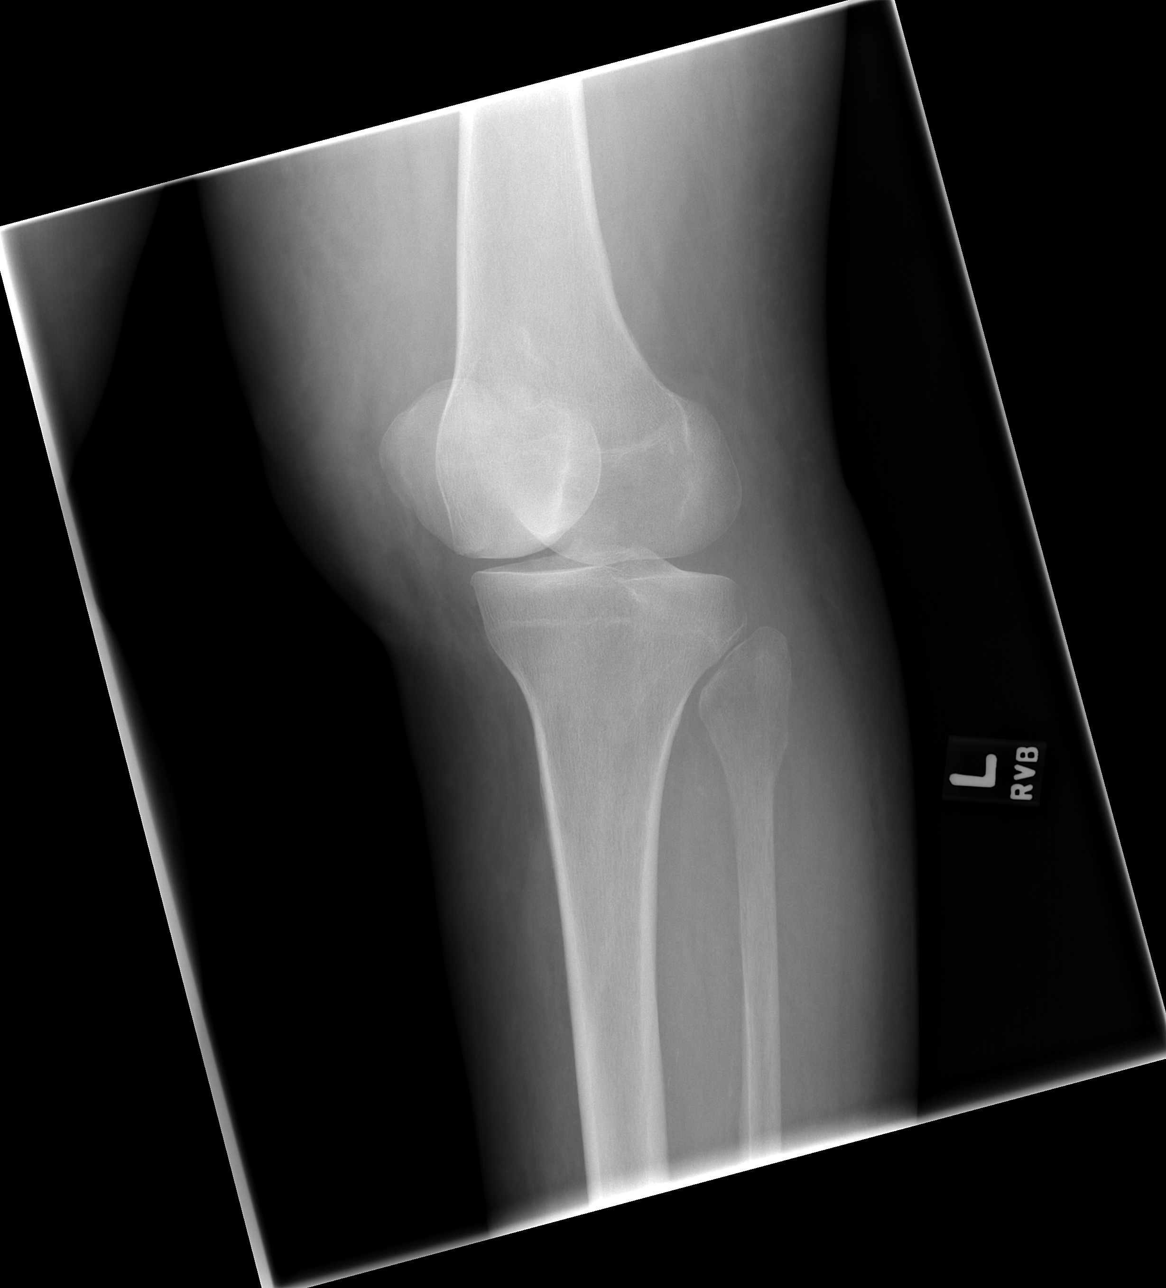

[t knee lat left]
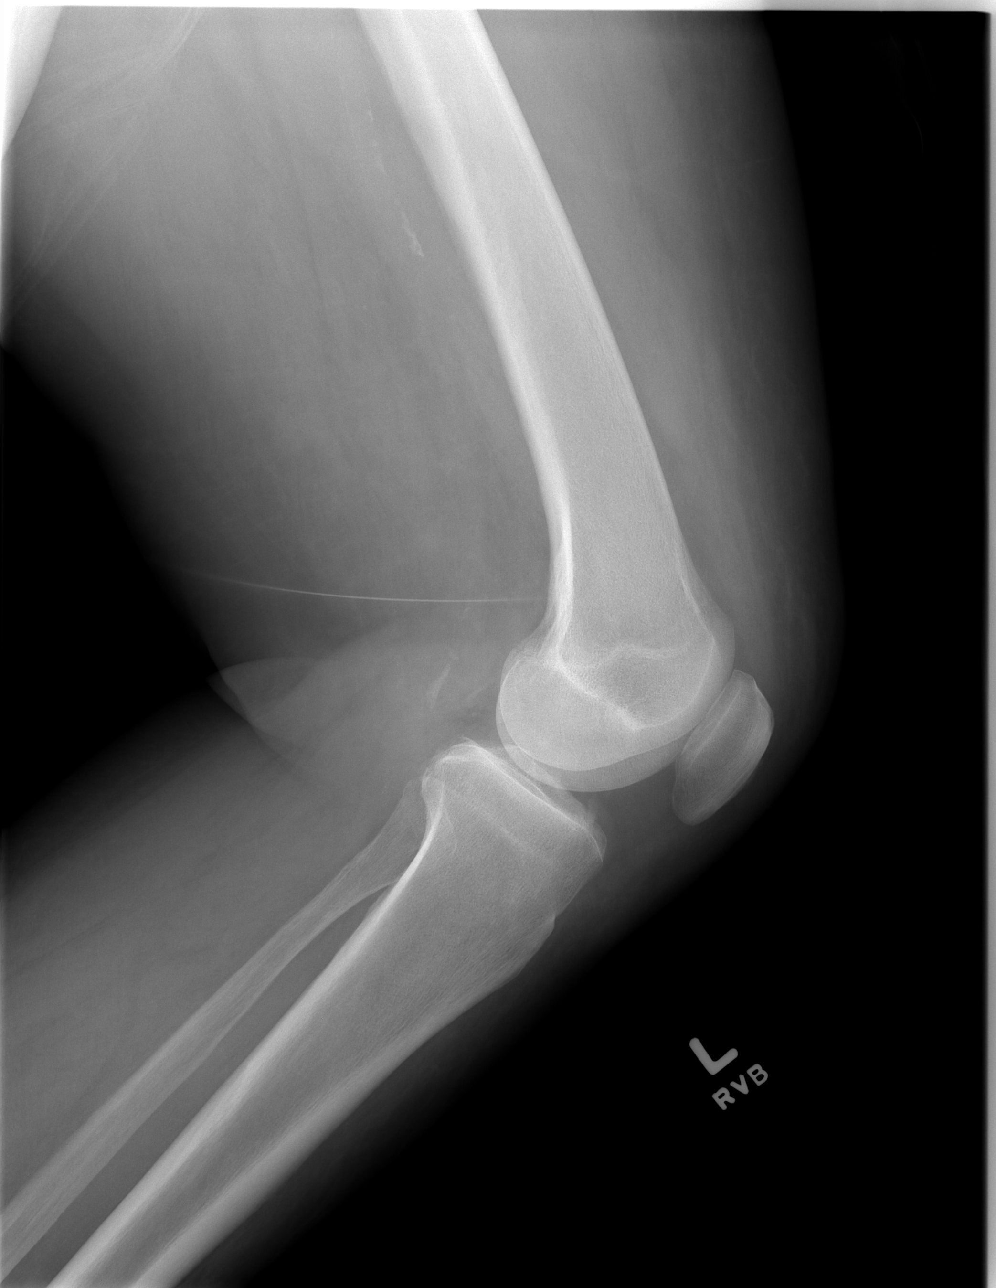

[4 of 4 positions shown; findings below may reference images not displayed]

No gross fracture or dislocation.  No evidence for joint effusion.  Lateral film demonstrates a tiny fragment projecting over the anterior articular surface cortex of the tibial plateau.  This cannot be reliably localized on the other films.  Cortical avulsion or tibial spine injury not excluded.  
 Vascular calcification noted.
IMPRESSION: 1.  No gross fracture or joint effusion. 
 2.  Tiny ossific fragment projects over the anterior joint on the lateral film.   Consider CT or MRI to further assess for internal derangement as clinically indicated. 
 3.  No evidence for joint effusion.

## 2006-05-23 ENCOUNTER — Ambulatory Visit: Payer: Self-pay | Admitting: Family Medicine

## 2006-05-24 IMAGING — CR DG KNEE COMPLETE 4+V*L*
4 series · 4 of 4 positions shown · non-contrast
Comparison: 04/17/06.

CLINICAL DATA: Fall, bruising. 
 LEFT KNEE ? 4 VIEW:

[view not recorded (1 of 4)]
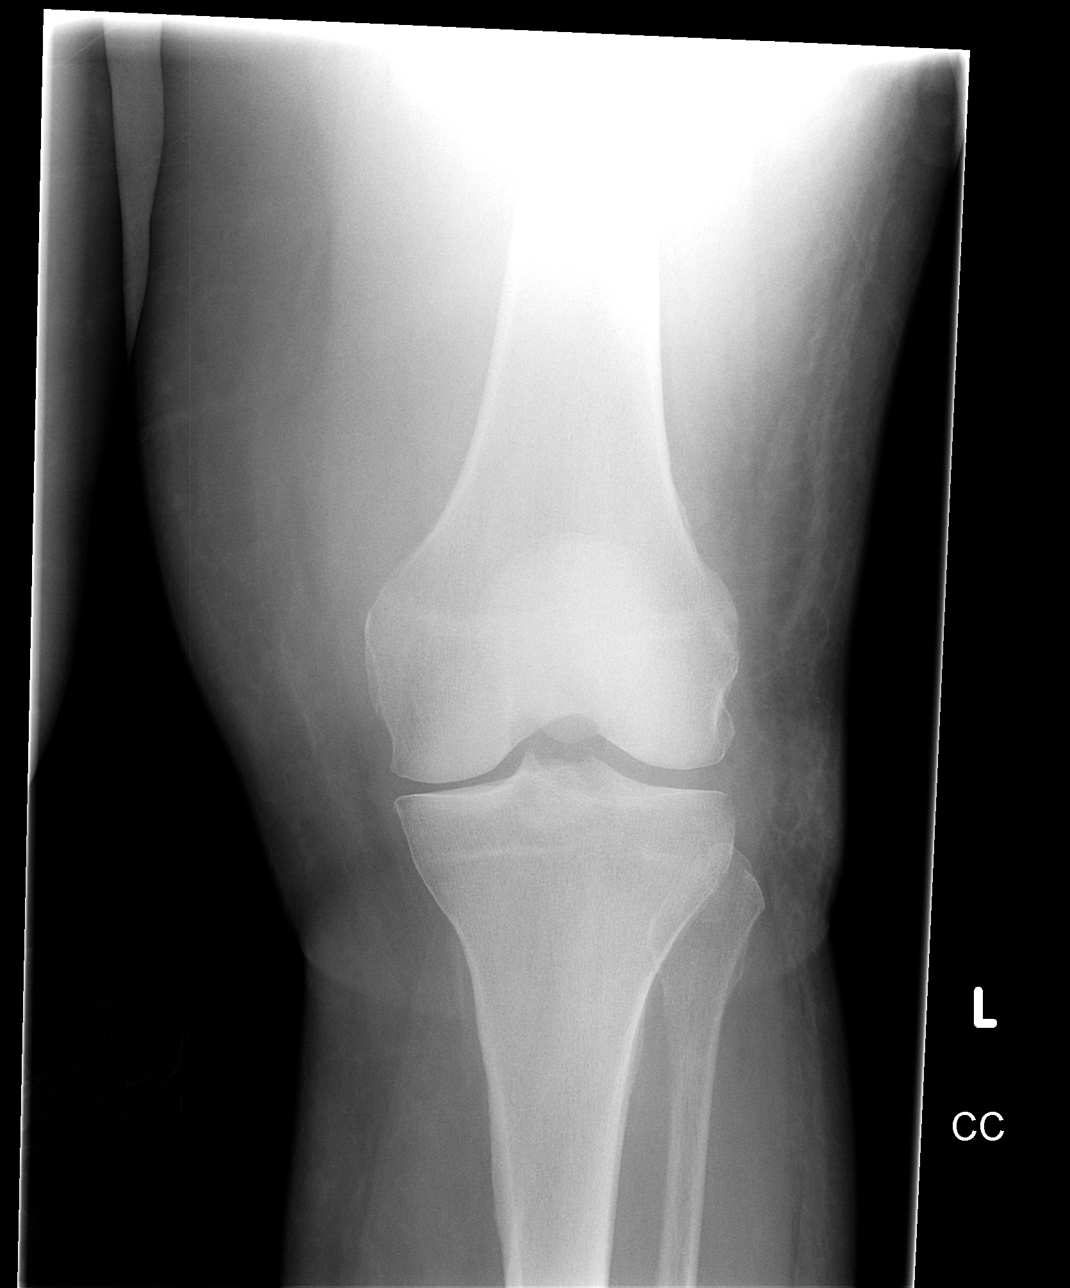

[view not recorded (2 of 4)]
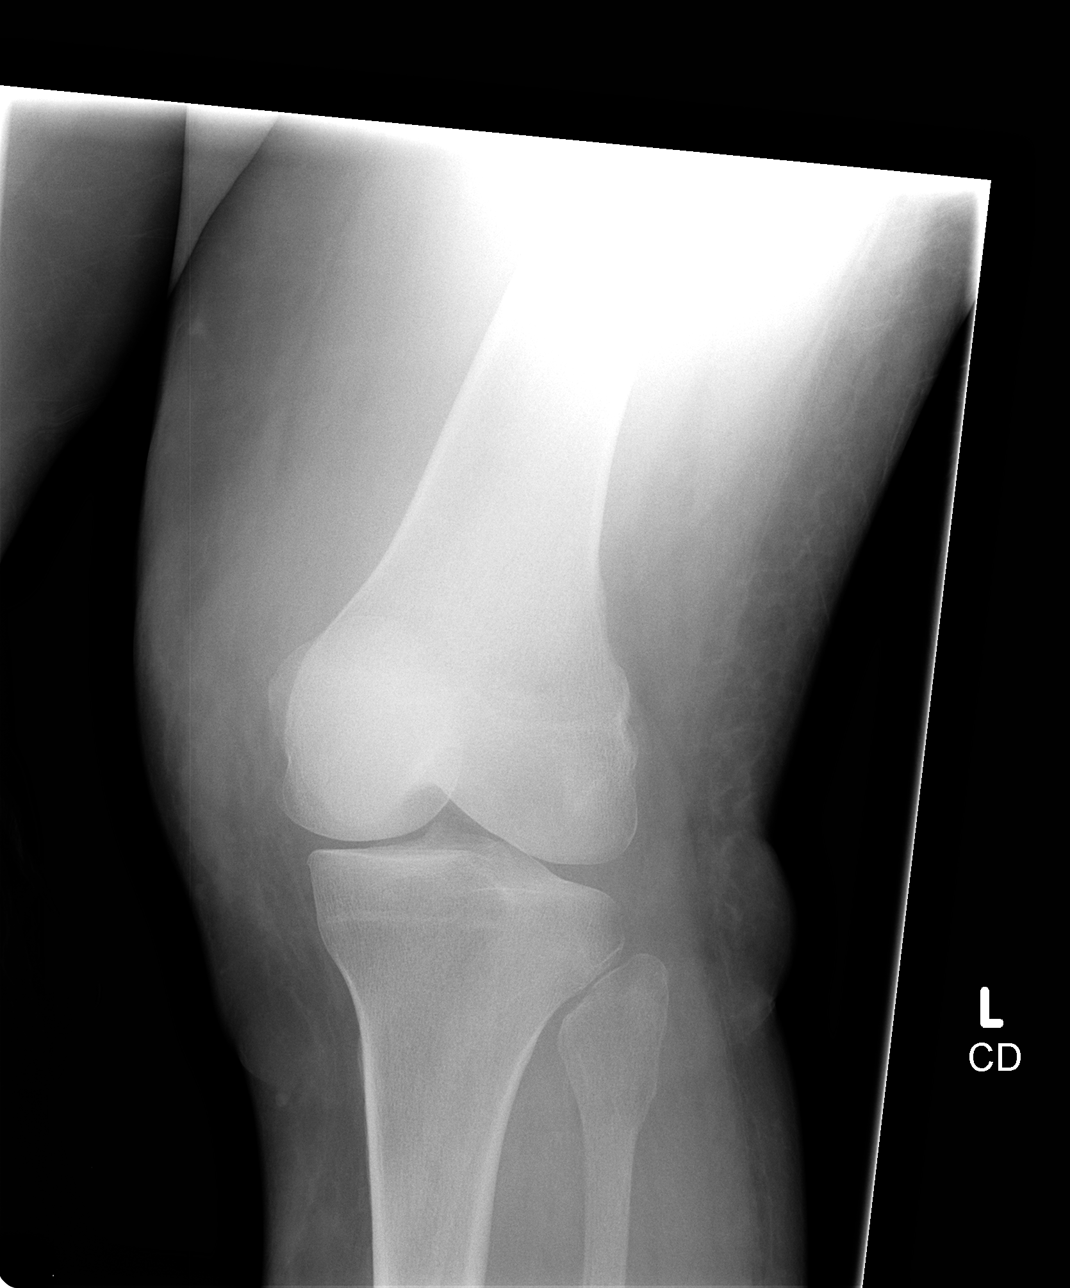

[view not recorded (3 of 4)]
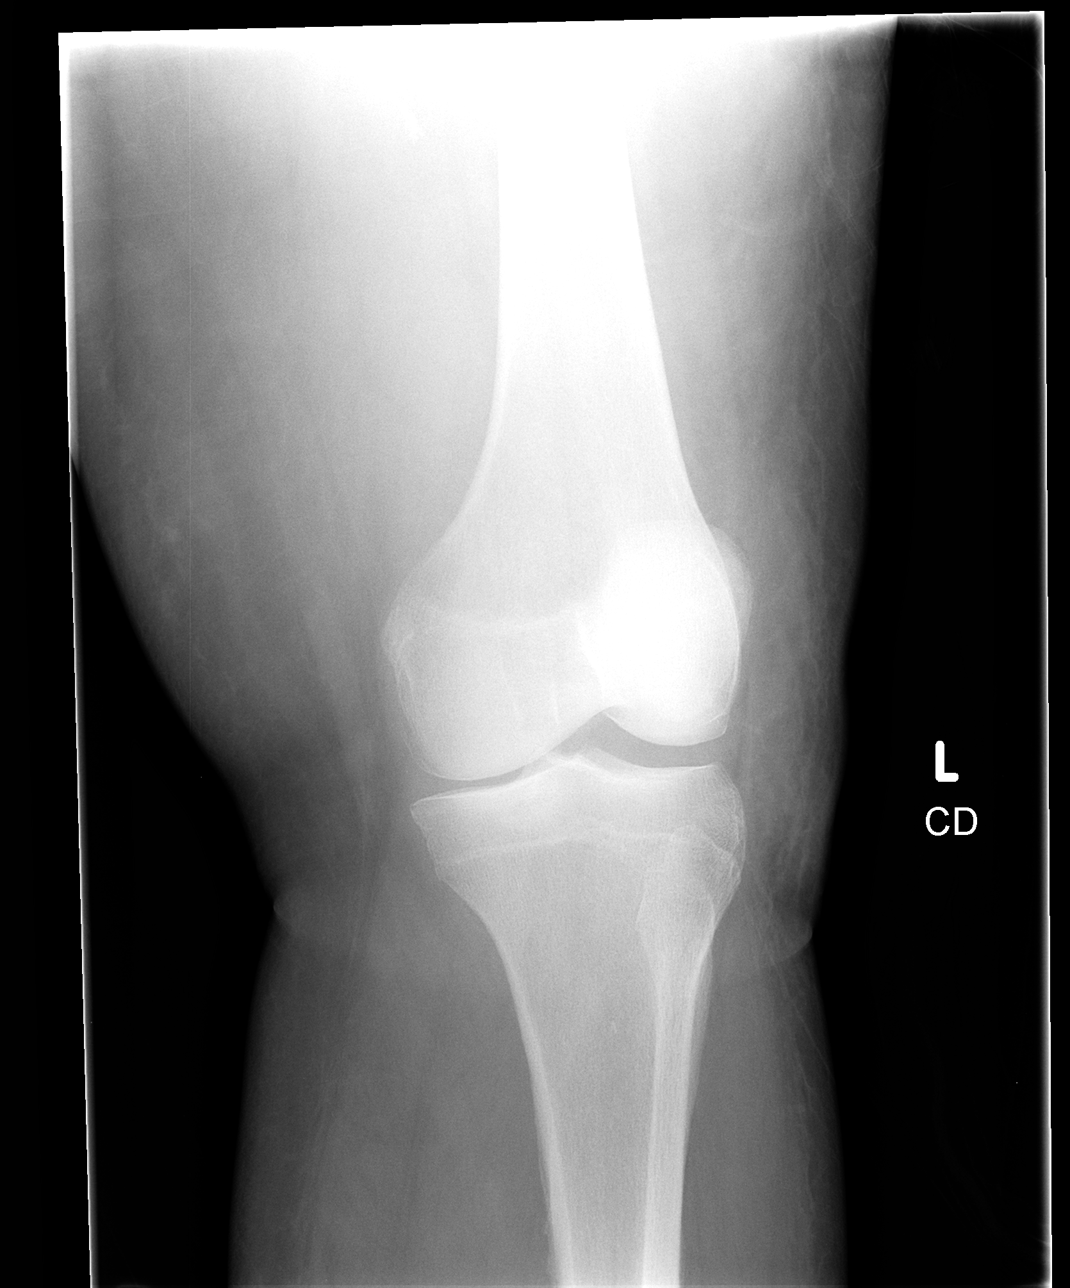

[view not recorded (4 of 4)]
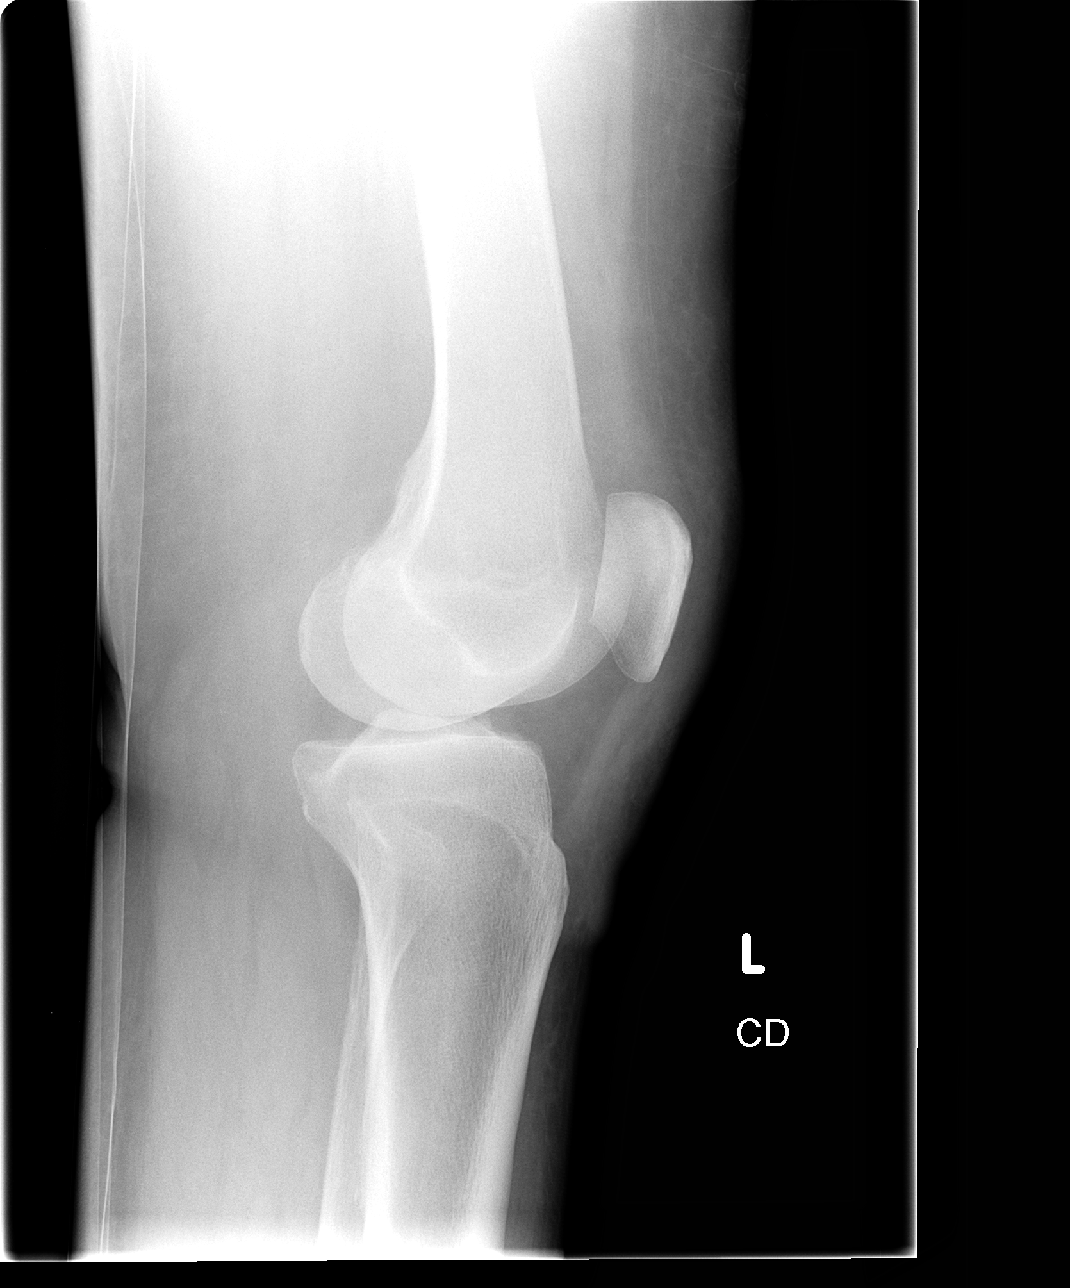

[4 of 4 positions shown; findings below may reference images not displayed]

FINDINGS: Normal bone density and alignment without acute displaced fracture.
IMPRESSION: Stable exam.   No acute finding by plain radiography.

## 2006-05-27 ENCOUNTER — Emergency Department (HOSPITAL_COMMUNITY): Admission: EM | Admit: 2006-05-27 | Discharge: 2006-05-27 | Payer: Self-pay | Admitting: Emergency Medicine

## 2006-06-03 IMAGING — CR DG CHEST 1V PORT
1 series · 1 of 1 positions shown · non-contrast
Comparison: 03/10/06.
 PORTABLE CHEST - 1 VIEW, 05/01/06 AT 8878 HOURS:

CLINICAL DATA: Respiratory distress.

[view not recorded]
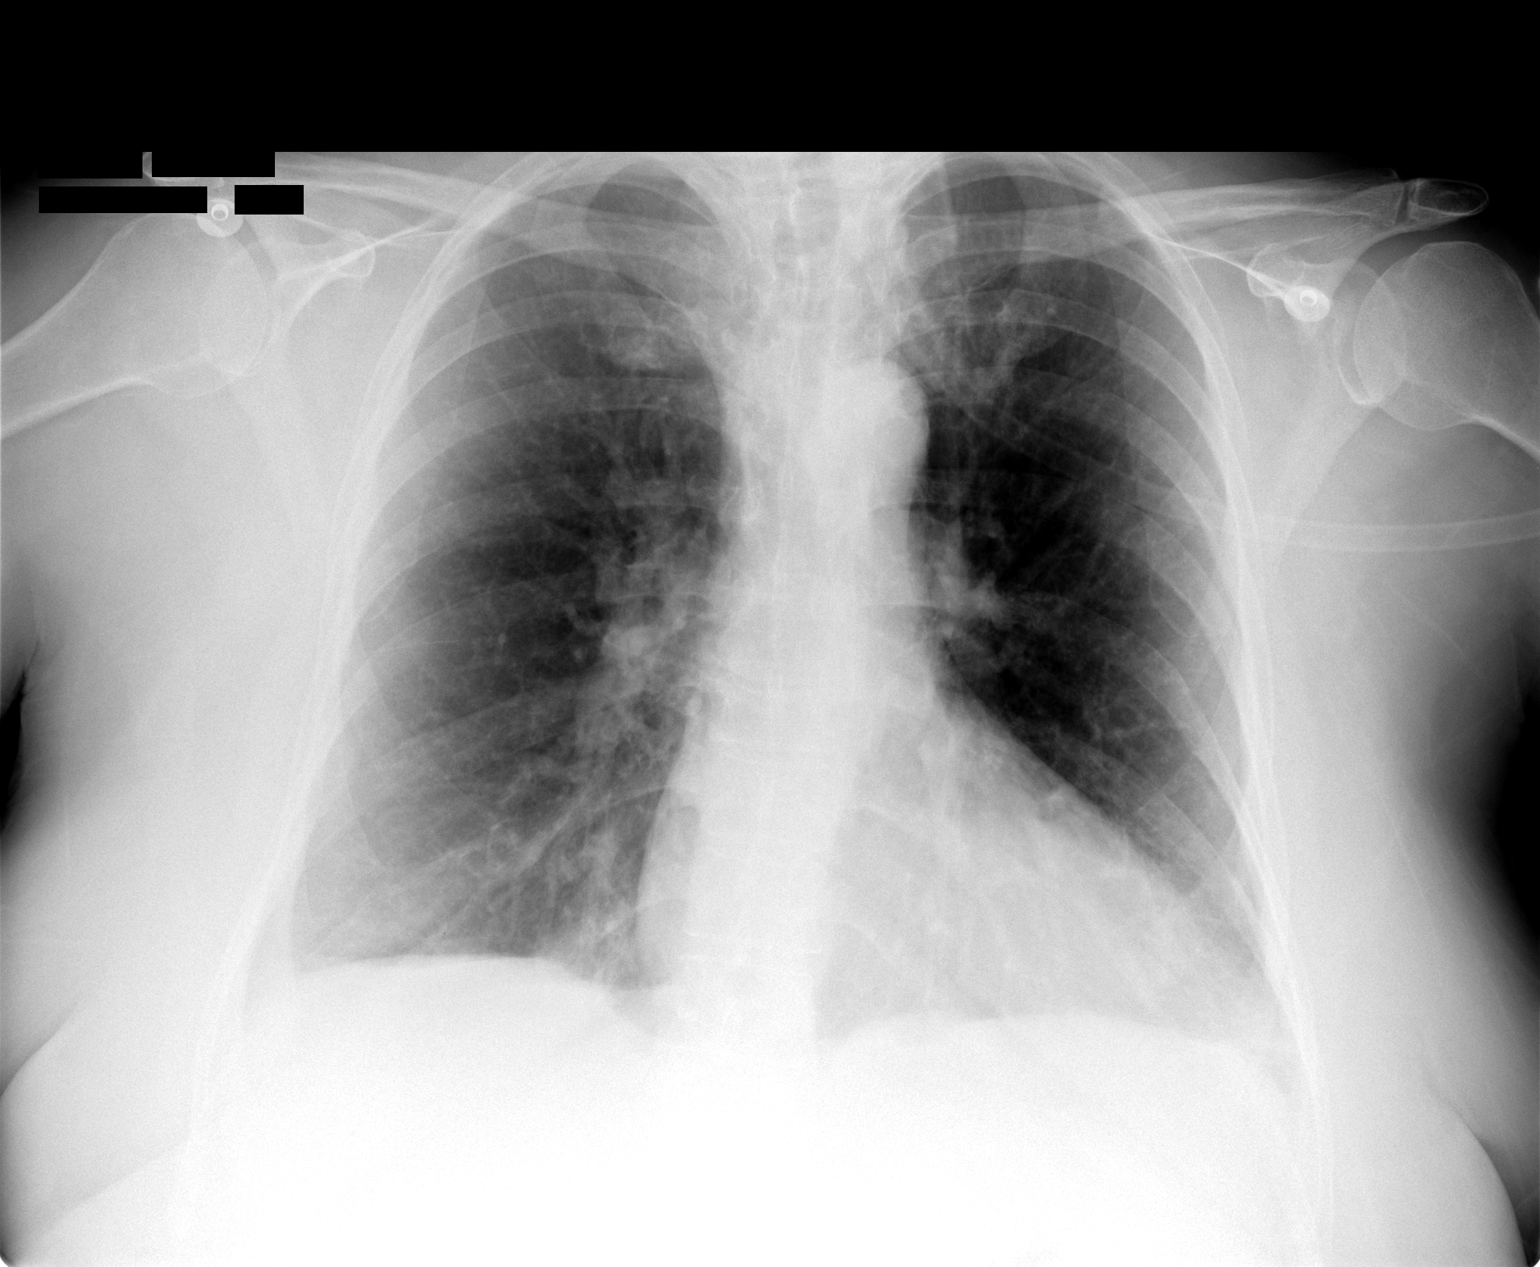

[1 of 1 positions shown; findings below may reference images not displayed]

FINDINGS: The heart is enlarged.  Pulmonary vascularity is within normal limits.  Lungs are clear.  No pneumothoraces or effusions are seen.
IMPRESSION: Cardiomegaly without CHF.

## 2006-06-15 ENCOUNTER — Ambulatory Visit: Payer: Self-pay | Admitting: Family Medicine

## 2006-06-17 ENCOUNTER — Emergency Department (HOSPITAL_COMMUNITY): Admission: EM | Admit: 2006-06-17 | Discharge: 2006-06-17 | Payer: Self-pay | Admitting: *Deleted

## 2006-06-25 ENCOUNTER — Emergency Department (HOSPITAL_COMMUNITY): Admission: EM | Admit: 2006-06-25 | Discharge: 2006-06-25 | Payer: Self-pay | Admitting: Emergency Medicine

## 2006-06-29 IMAGING — CR DG ABDOMEN ACUTE W/ 1V CHEST
3 series · 3 of 3 positions shown · non-contrast
Comparison: 11/24/05

CLINICAL DATA: Constipation/abdominal pain.
 ACUTE ABDOMEN WITH CHEST ? 3 VIEW:

[w chest pa]
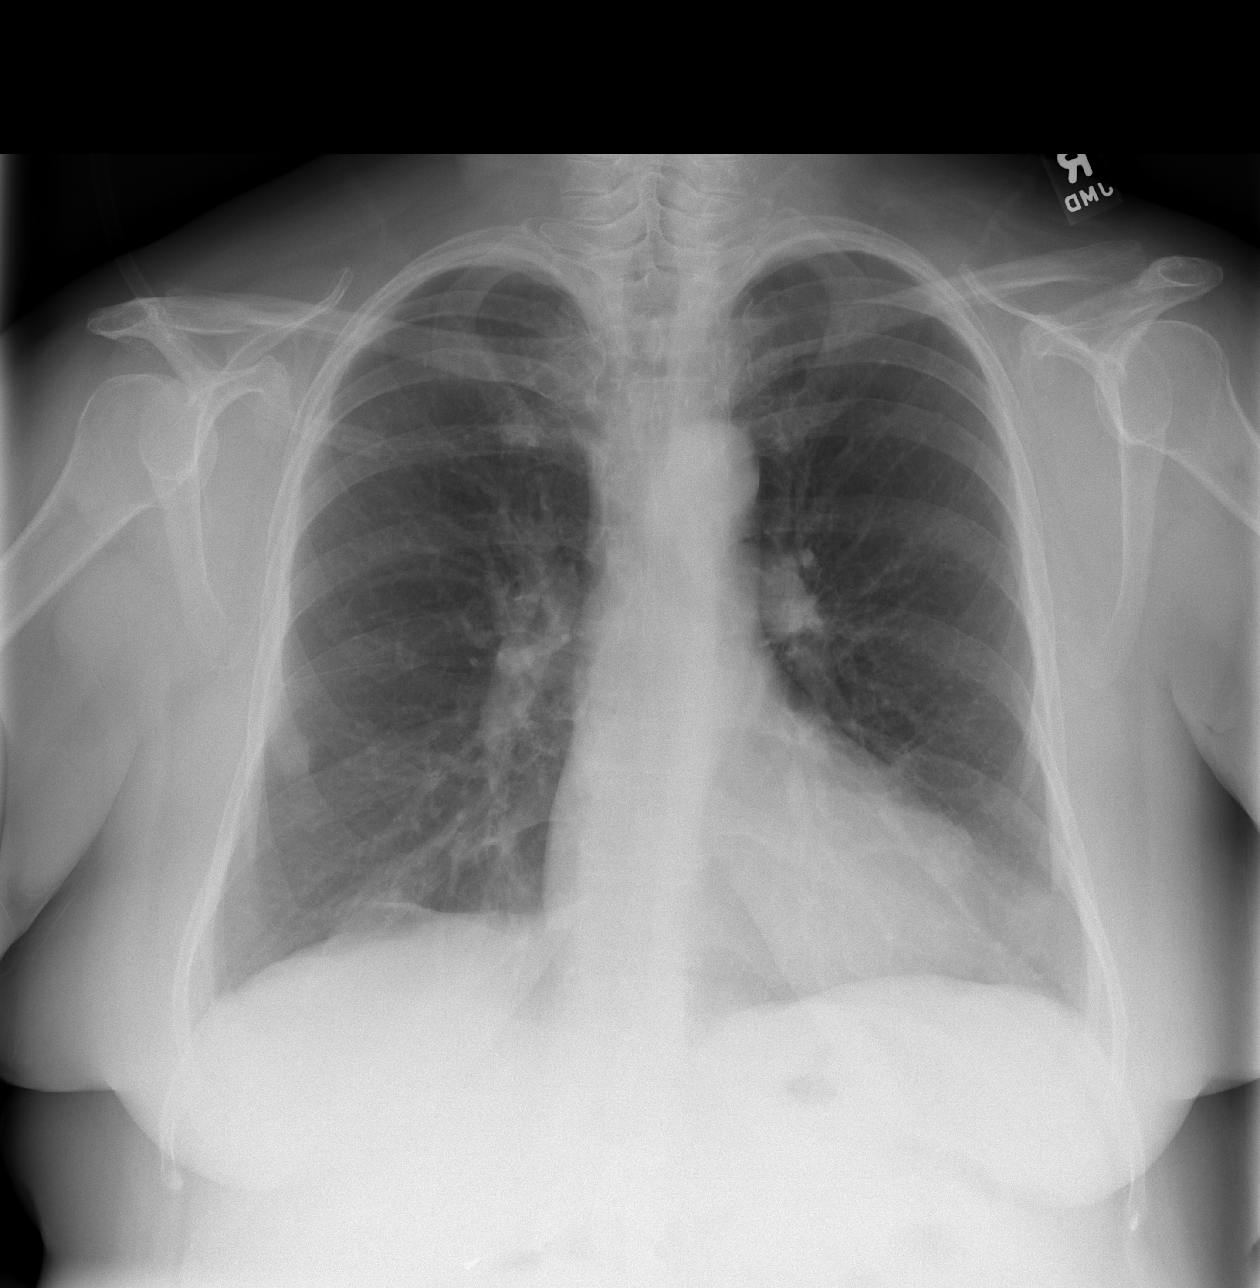

[w abdomen upright *]
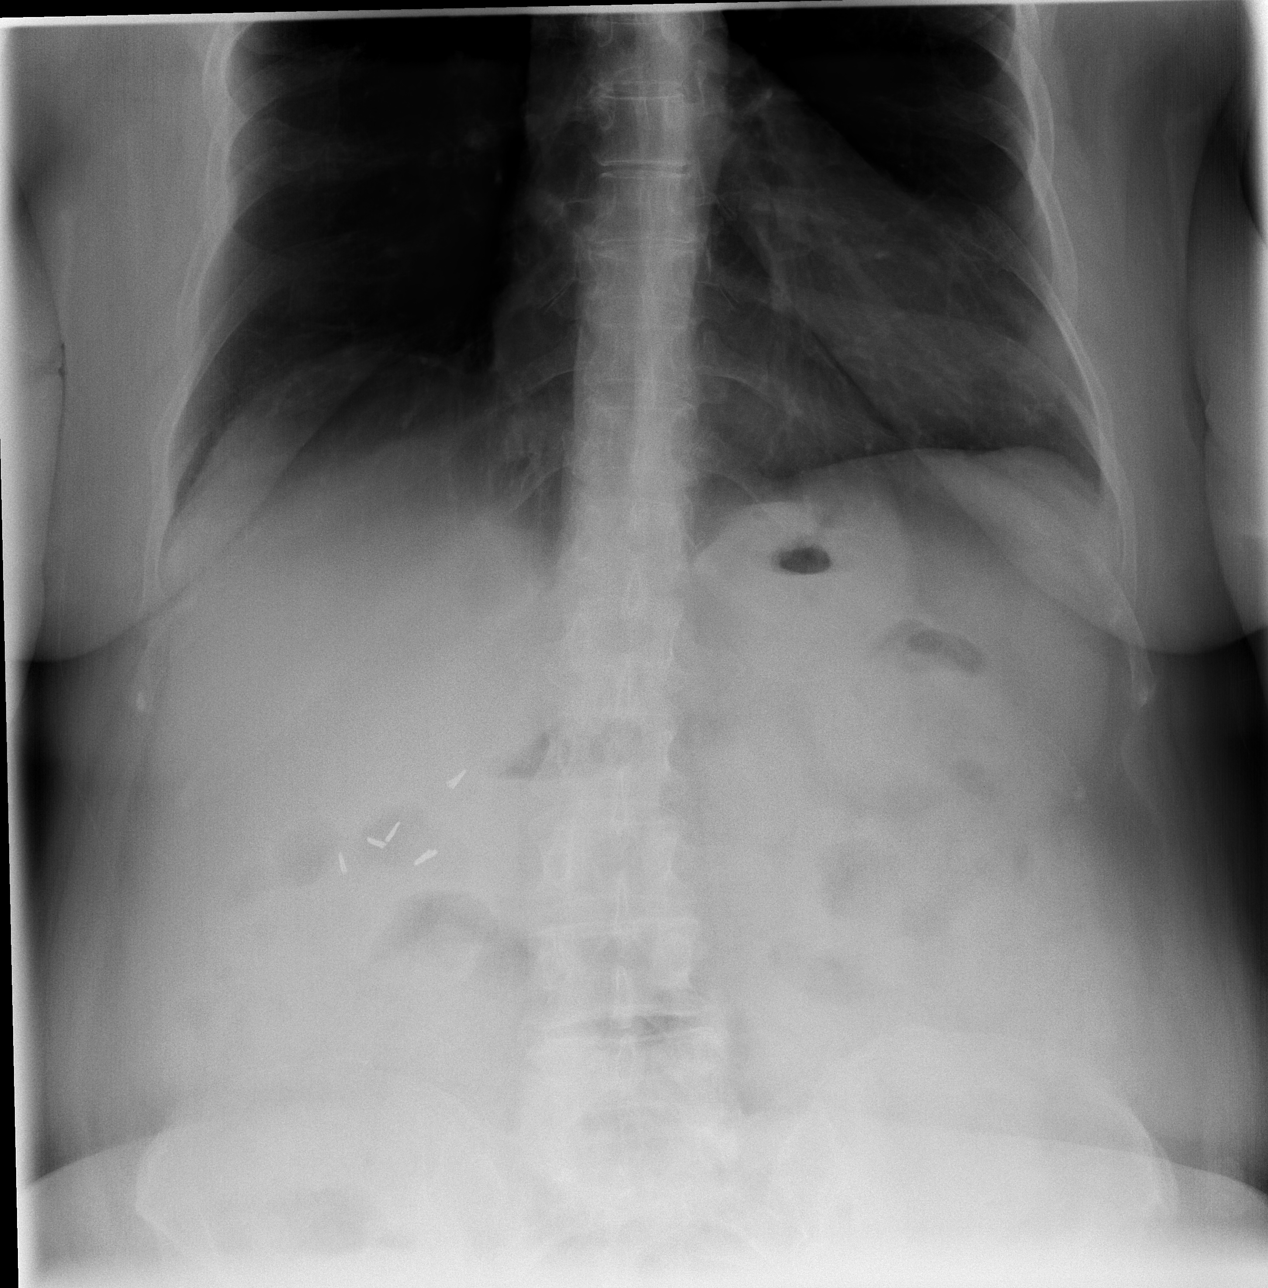

[t abdomen supine *]
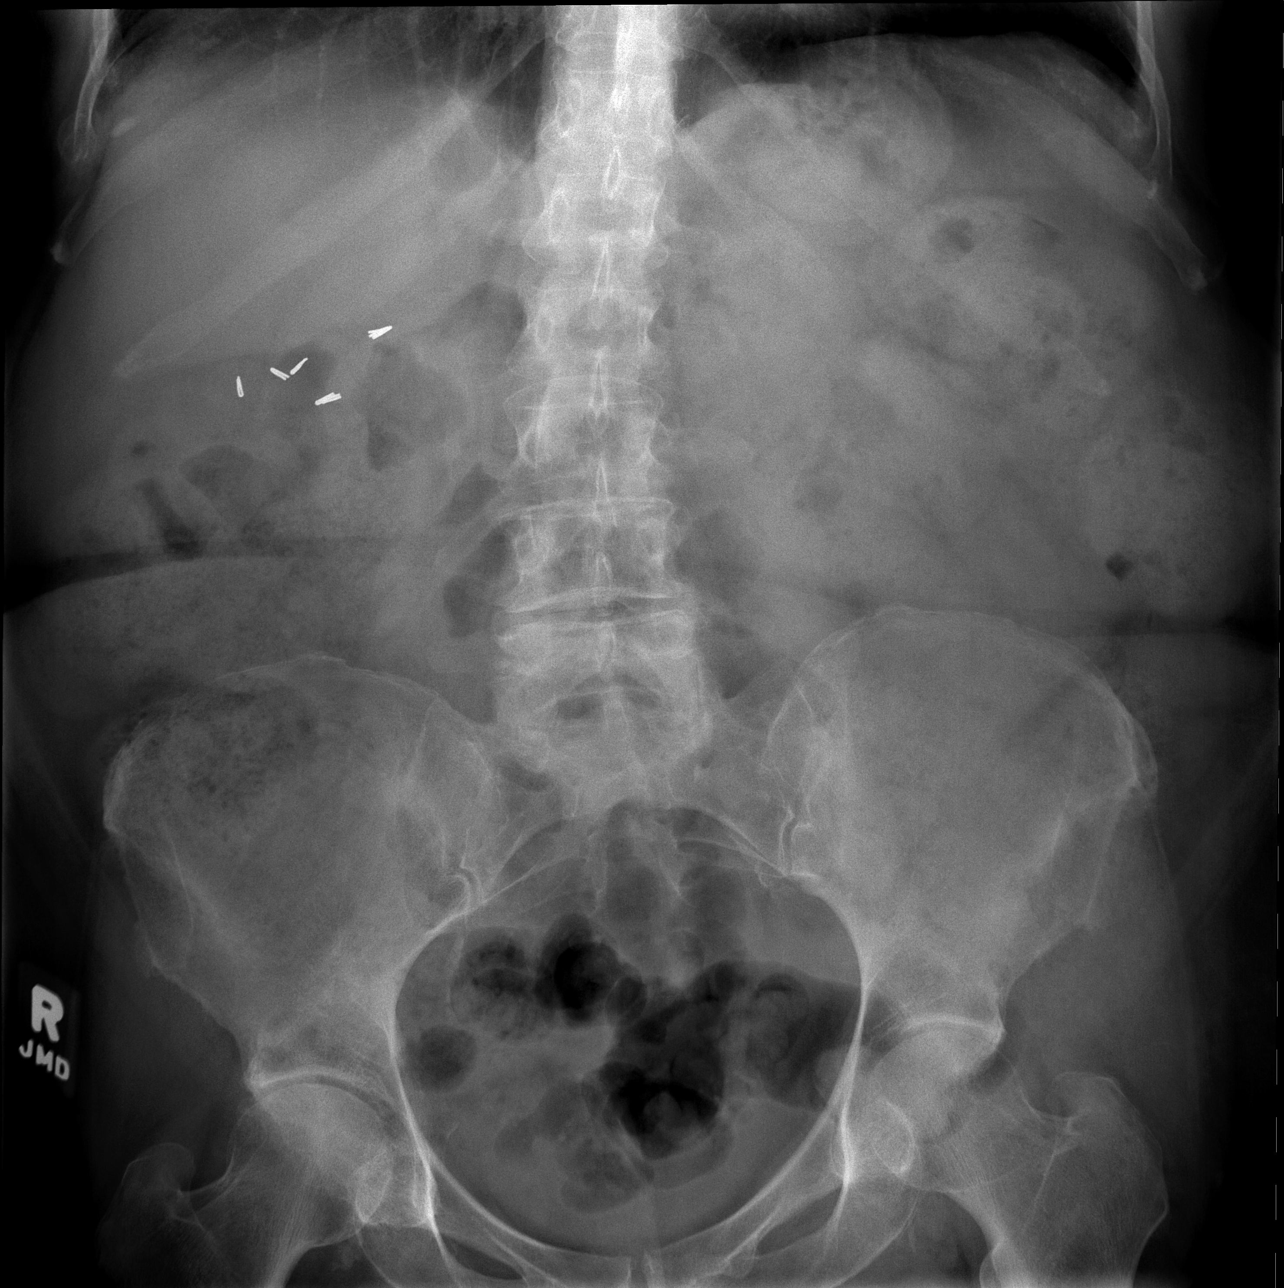

[3 of 3 positions shown; findings below may reference images not displayed]

FINDINGS: No active cardiopulmonary disease.  There appears to be a healing fracture of the right 6th rib.  There are chronic lung changes with flattened diaphragms and scarring.  
 No free air or acute/specific abnormality of the bowel gas pattern.   There is a relative increased amount of fecal material throughout the colon.  Neither psoas margin is distinct.  Prior right upper quadrant surgery.
IMPRESSION: 1.  Chronic lung changes.  
 2.  Healing right rib fracture.
 3.  Relative increased stool throughout the colon ? abdomen otherwise unremarkable.

## 2006-07-01 ENCOUNTER — Emergency Department (HOSPITAL_COMMUNITY): Admission: EM | Admit: 2006-07-01 | Discharge: 2006-07-01 | Payer: Self-pay | Admitting: Emergency Medicine

## 2006-07-02 ENCOUNTER — Emergency Department (HOSPITAL_COMMUNITY): Admission: EM | Admit: 2006-07-02 | Discharge: 2006-07-02 | Payer: Self-pay | Admitting: Emergency Medicine

## 2006-07-03 ENCOUNTER — Emergency Department (HOSPITAL_COMMUNITY): Admission: EM | Admit: 2006-07-03 | Discharge: 2006-07-03 | Payer: Self-pay | Admitting: Emergency Medicine

## 2006-07-07 ENCOUNTER — Emergency Department (HOSPITAL_COMMUNITY): Admission: EM | Admit: 2006-07-07 | Discharge: 2006-07-07 | Payer: Self-pay | Admitting: Emergency Medicine

## 2006-07-15 ENCOUNTER — Ambulatory Visit: Payer: Self-pay | Admitting: Family Medicine

## 2006-07-16 ENCOUNTER — Emergency Department (HOSPITAL_COMMUNITY): Admission: EM | Admit: 2006-07-16 | Discharge: 2006-07-16 | Payer: Self-pay | Admitting: Emergency Medicine

## 2006-07-18 ENCOUNTER — Emergency Department (HOSPITAL_COMMUNITY): Admission: EM | Admit: 2006-07-18 | Discharge: 2006-07-18 | Payer: Self-pay | Admitting: *Deleted

## 2006-07-19 ENCOUNTER — Emergency Department (HOSPITAL_COMMUNITY): Admission: EM | Admit: 2006-07-19 | Discharge: 2006-07-19 | Payer: Self-pay | Admitting: Emergency Medicine

## 2006-07-20 ENCOUNTER — Emergency Department (HOSPITAL_COMMUNITY): Admission: EM | Admit: 2006-07-20 | Discharge: 2006-07-20 | Payer: Self-pay | Admitting: *Deleted

## 2006-07-20 IMAGING — CR DG CHEST 2V
2 series · 2 of 2 positions shown · non-contrast
Comparison: 05/01/06.

CLINICAL DATA: Shortness of breath.  Weakness.  
 CHEST - 2 VIEW:

[w chest pa]
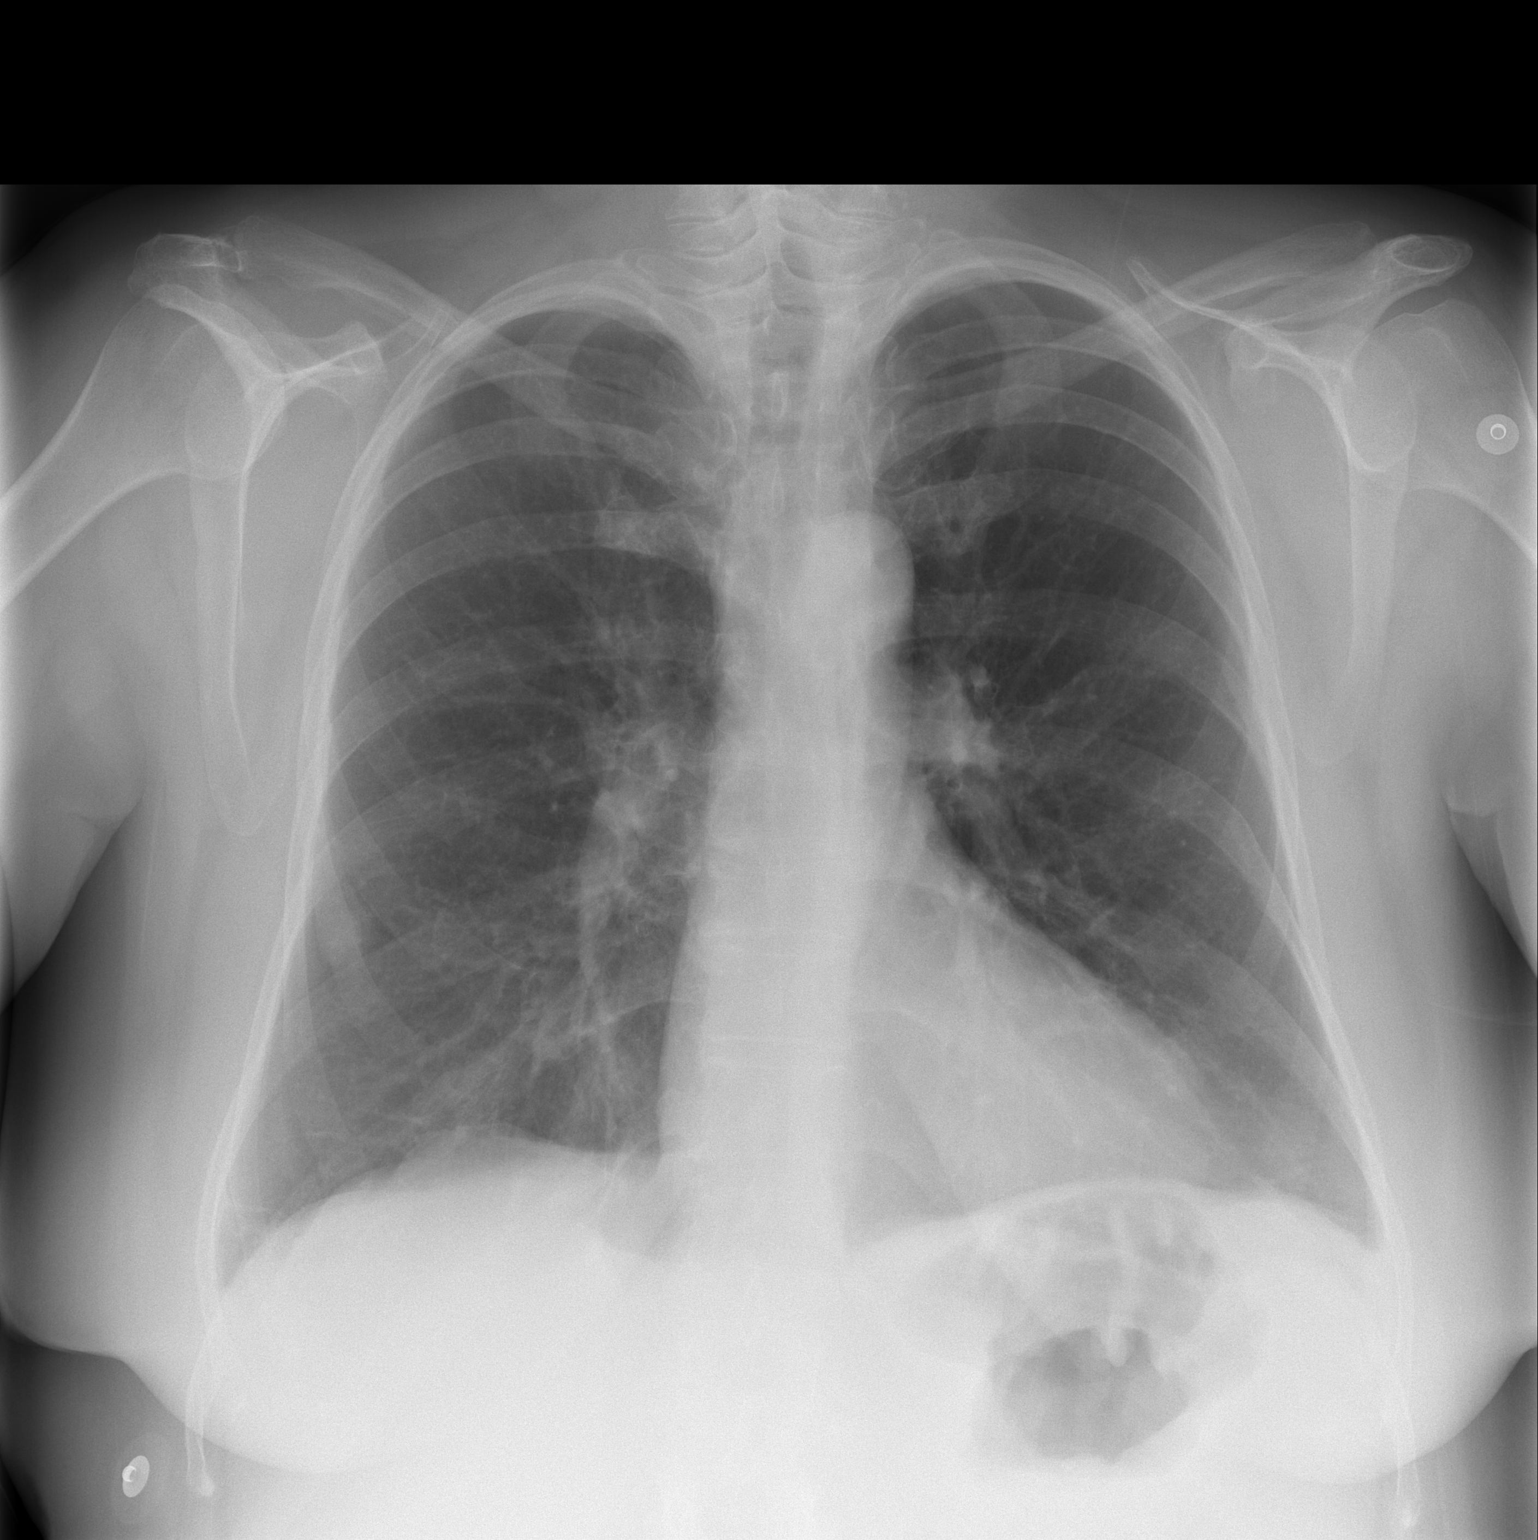

[w chest lat]
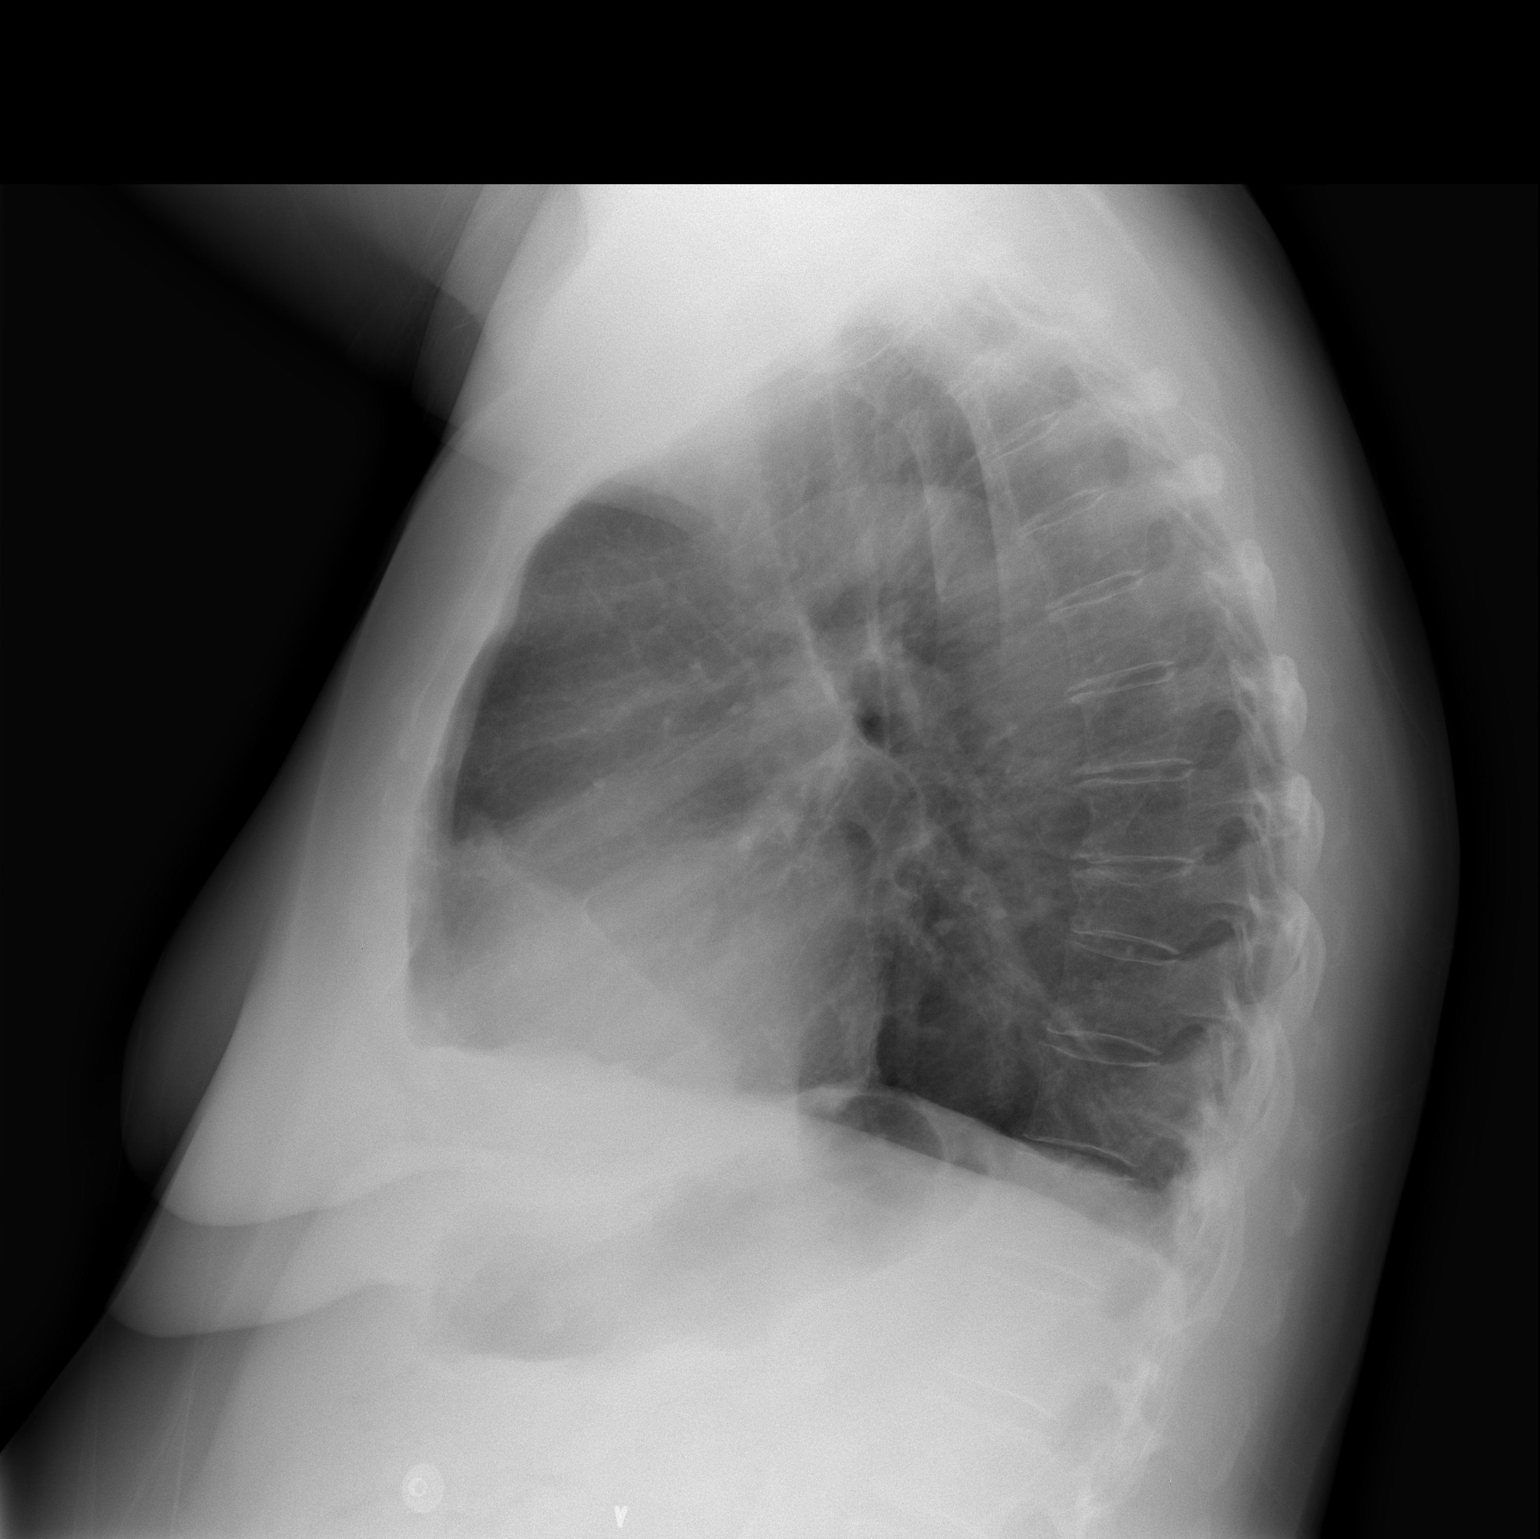

[2 of 2 positions shown; findings below may reference images not displayed]

FINDINGS: Trachea is midline.   Heart size is within normal limits.  Lungs are clear.  There is an old right rib fracture.  No pleural fluid.
IMPRESSION: No acute findings.

## 2006-07-21 ENCOUNTER — Ambulatory Visit: Payer: Self-pay | Admitting: Family Medicine

## 2006-07-22 ENCOUNTER — Ambulatory Visit: Payer: Self-pay | Admitting: Family Medicine

## 2006-07-22 ENCOUNTER — Inpatient Hospital Stay (HOSPITAL_COMMUNITY): Admission: EM | Admit: 2006-07-22 | Discharge: 2006-07-31 | Payer: Self-pay | Admitting: Emergency Medicine

## 2006-07-25 ENCOUNTER — Encounter (INDEPENDENT_AMBULATORY_CARE_PROVIDER_SITE_OTHER): Payer: Self-pay | Admitting: *Deleted

## 2006-07-25 ENCOUNTER — Encounter (INDEPENDENT_AMBULATORY_CARE_PROVIDER_SITE_OTHER): Payer: Self-pay | Admitting: Interventional Cardiology

## 2006-07-28 IMAGING — CR DG CHEST 2V
2 series · 2 of 2 positions shown · non-contrast
Comparison: Multiple prior studies including [DATE], [DATE], and [DATE].

CLINICAL DATA: 64-year-old with shortness of breath.  Patient complains of shortness of breath and nausea.  
 CHEST - 2 VIEW:

[w chest pa]
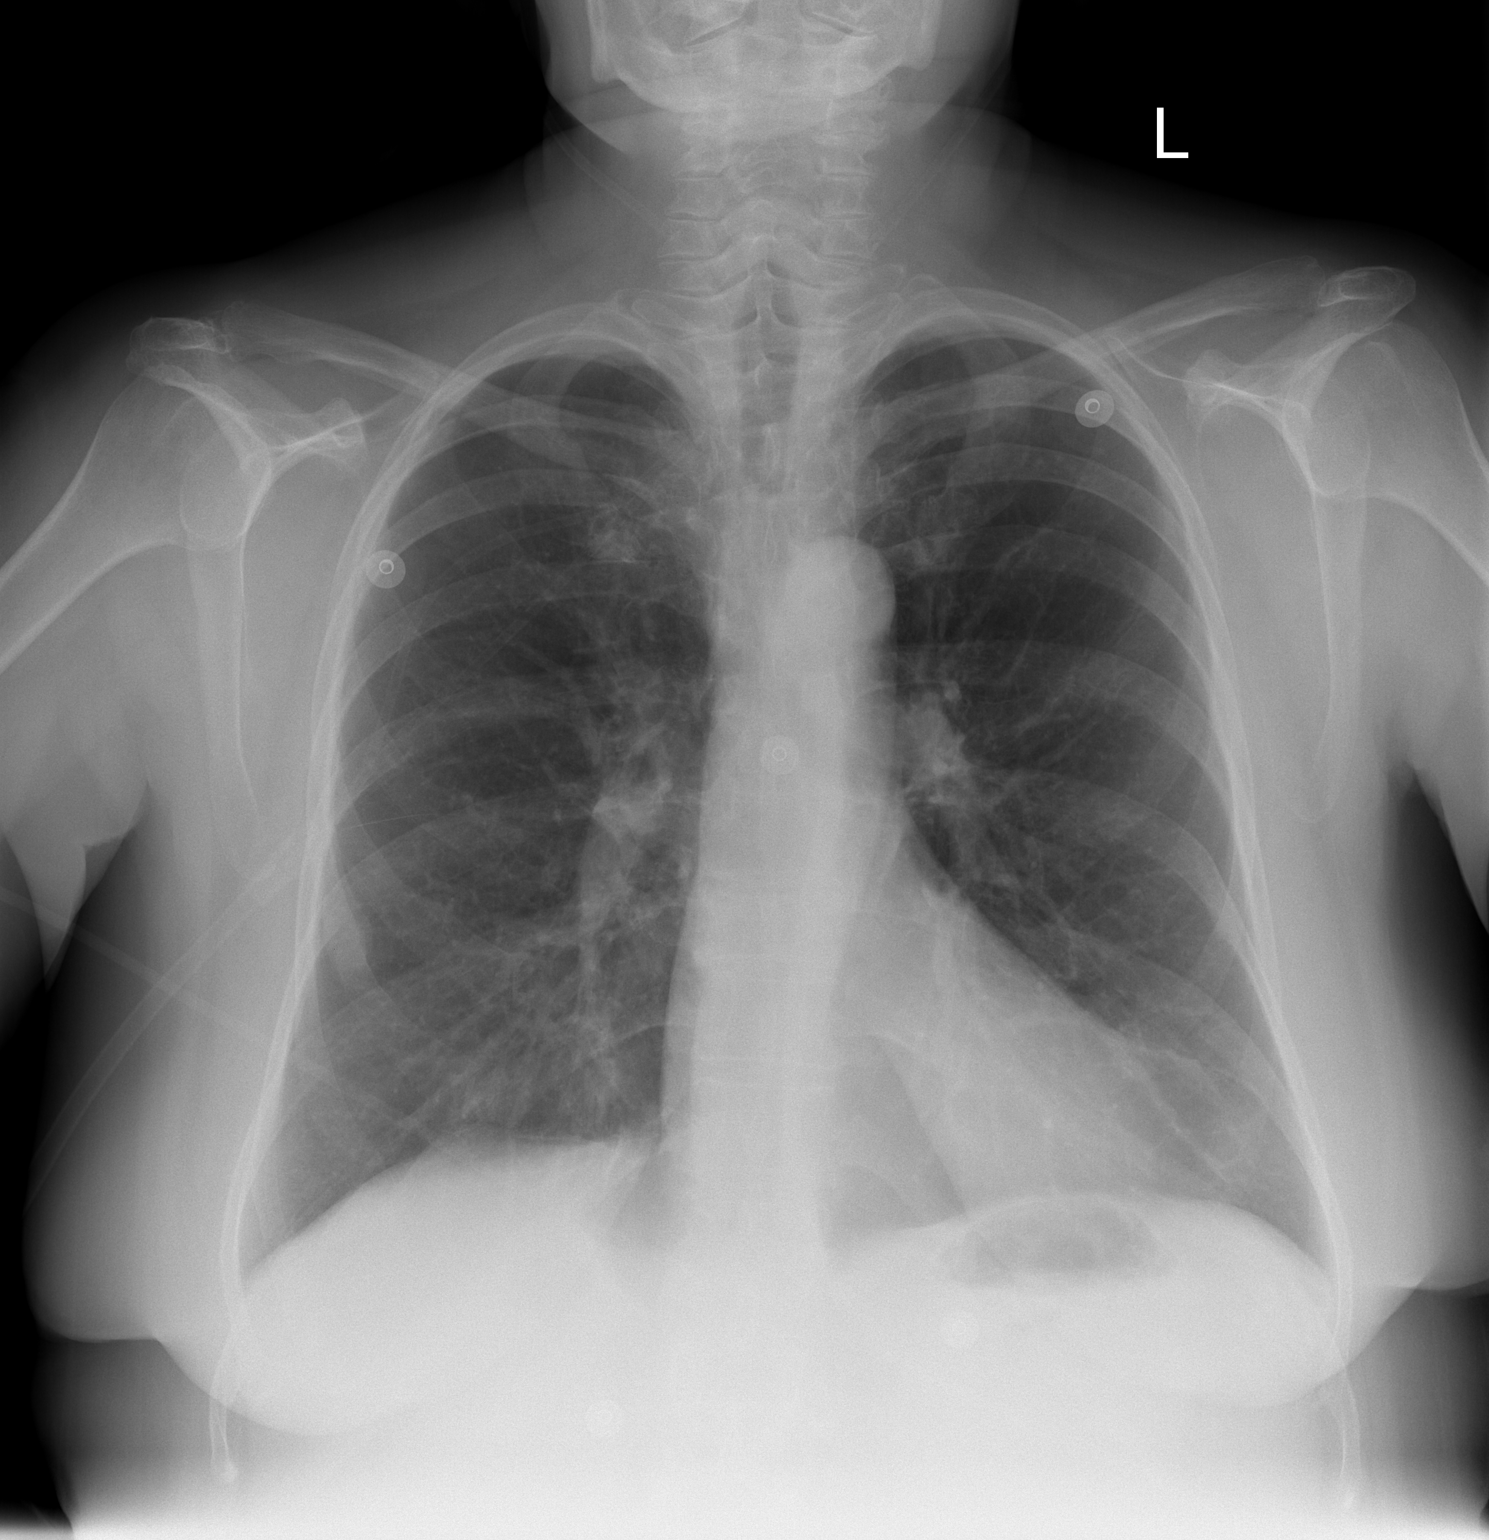

[w chest lat *]
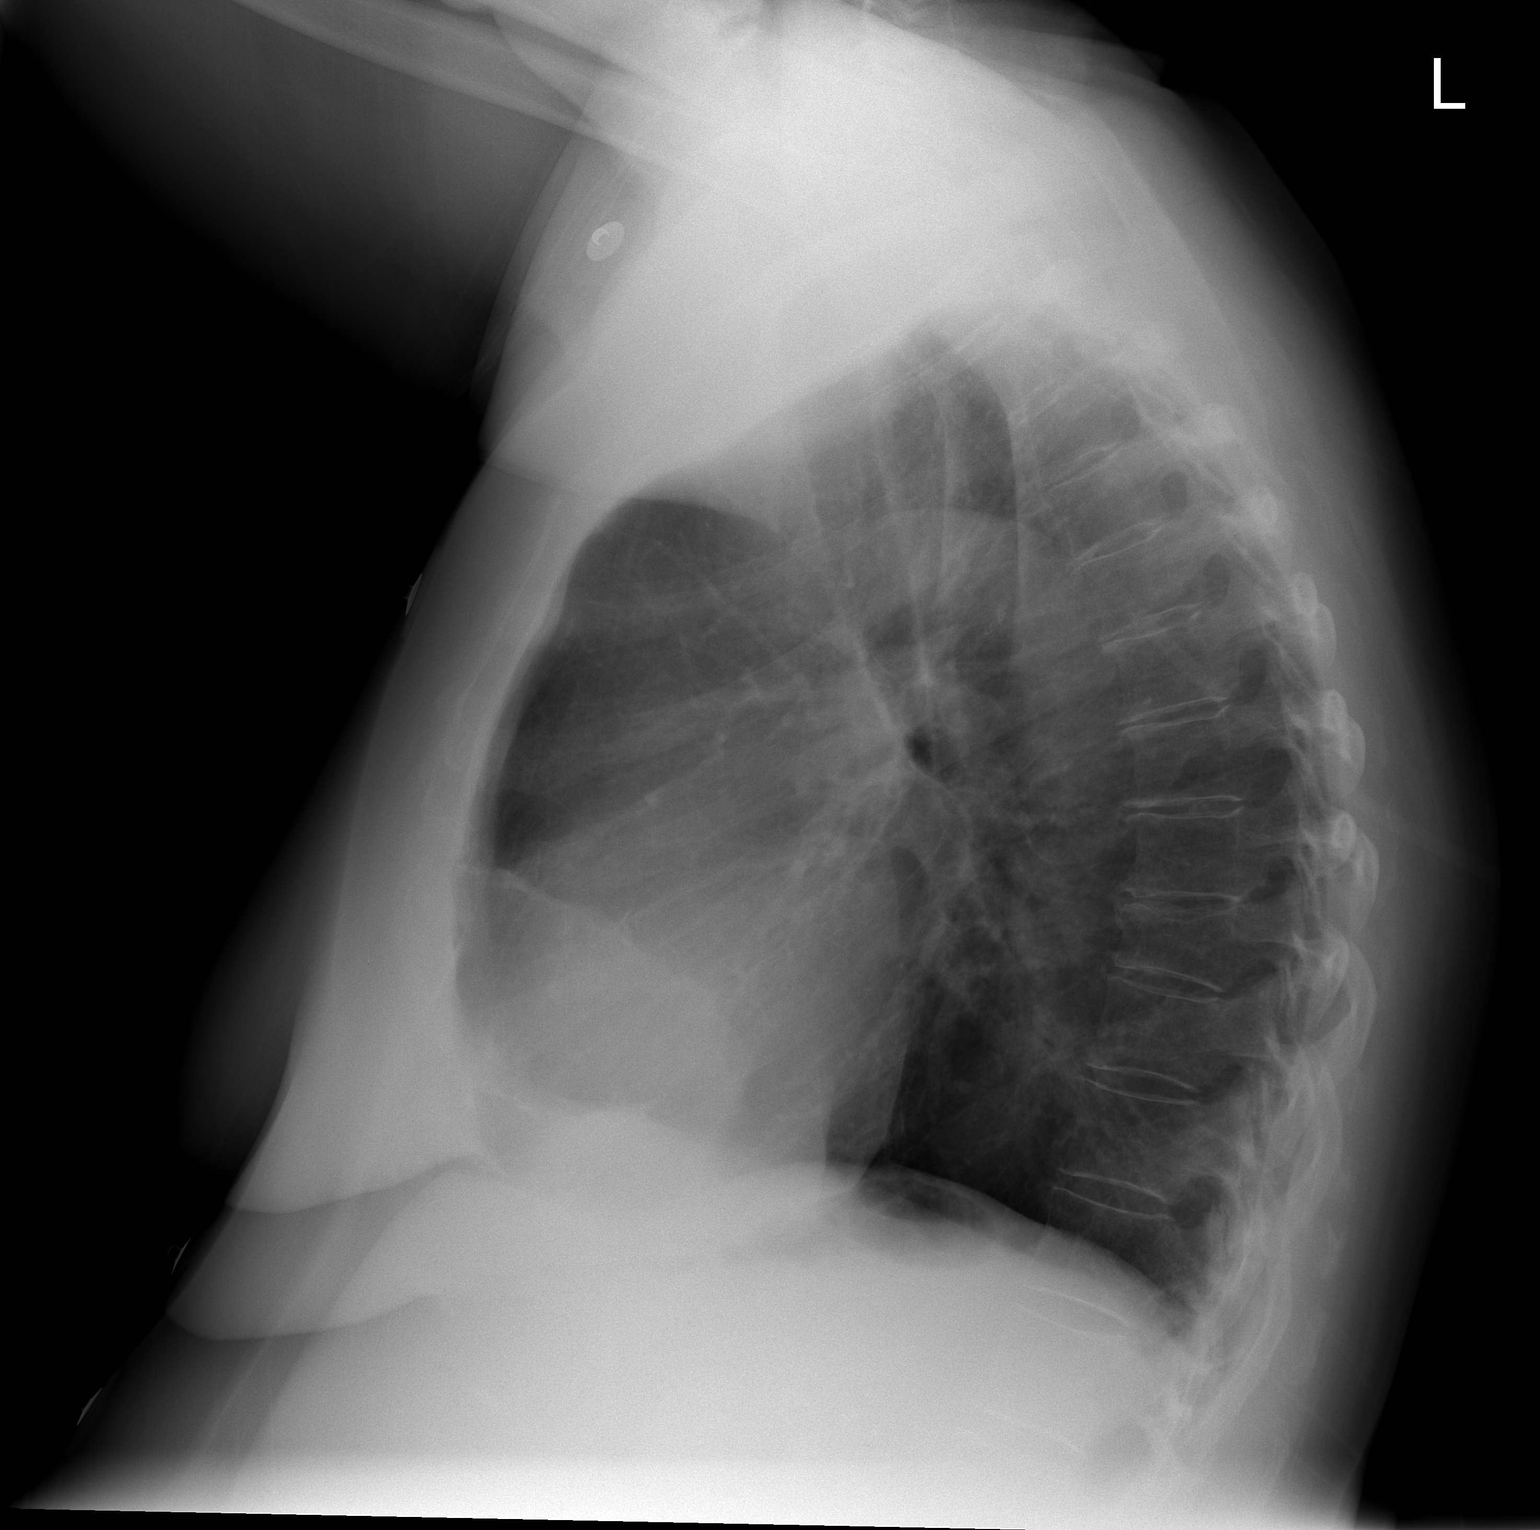

[2 of 2 positions shown; findings below may reference images not displayed]

Heart is normal in size.  No focal consolidations or pleural effusions are noted.  There is no evidence for pulmonary edema.  The right anterolateral 6th rib is notable for contour deformity, raising a question of healing rib fracture or chronic rib fracture.
IMPRESSION: 1.  No evidence for acute cardiopulmonary disease. 
 2.  Question of prior right rib fracture.

## 2006-07-31 ENCOUNTER — Emergency Department (HOSPITAL_COMMUNITY): Admission: EM | Admit: 2006-07-31 | Discharge: 2006-07-31 | Payer: Self-pay | Admitting: Emergency Medicine

## 2006-08-01 ENCOUNTER — Emergency Department (HOSPITAL_COMMUNITY): Admission: EM | Admit: 2006-08-01 | Discharge: 2006-08-01 | Payer: Self-pay | Admitting: Emergency Medicine

## 2006-08-02 ENCOUNTER — Emergency Department (HOSPITAL_COMMUNITY): Admission: EM | Admit: 2006-08-02 | Discharge: 2006-08-02 | Payer: Self-pay | Admitting: Emergency Medicine

## 2006-08-03 ENCOUNTER — Emergency Department (HOSPITAL_COMMUNITY): Admission: EM | Admit: 2006-08-03 | Discharge: 2006-08-03 | Payer: Self-pay | Admitting: Emergency Medicine

## 2006-08-03 ENCOUNTER — Emergency Department (HOSPITAL_COMMUNITY): Admission: EM | Admit: 2006-08-03 | Discharge: 2006-08-04 | Payer: Self-pay | Admitting: Emergency Medicine

## 2006-08-03 IMAGING — CR DG CHEST 1V PORT
1 series · 1 of 1 positions shown · non-contrast
Comparison: 06/25/06

CLINICAL DATA: Shortness of breath, back pain.
 PORTABLE CHEST ? 1 VIEW:

[view not recorded]
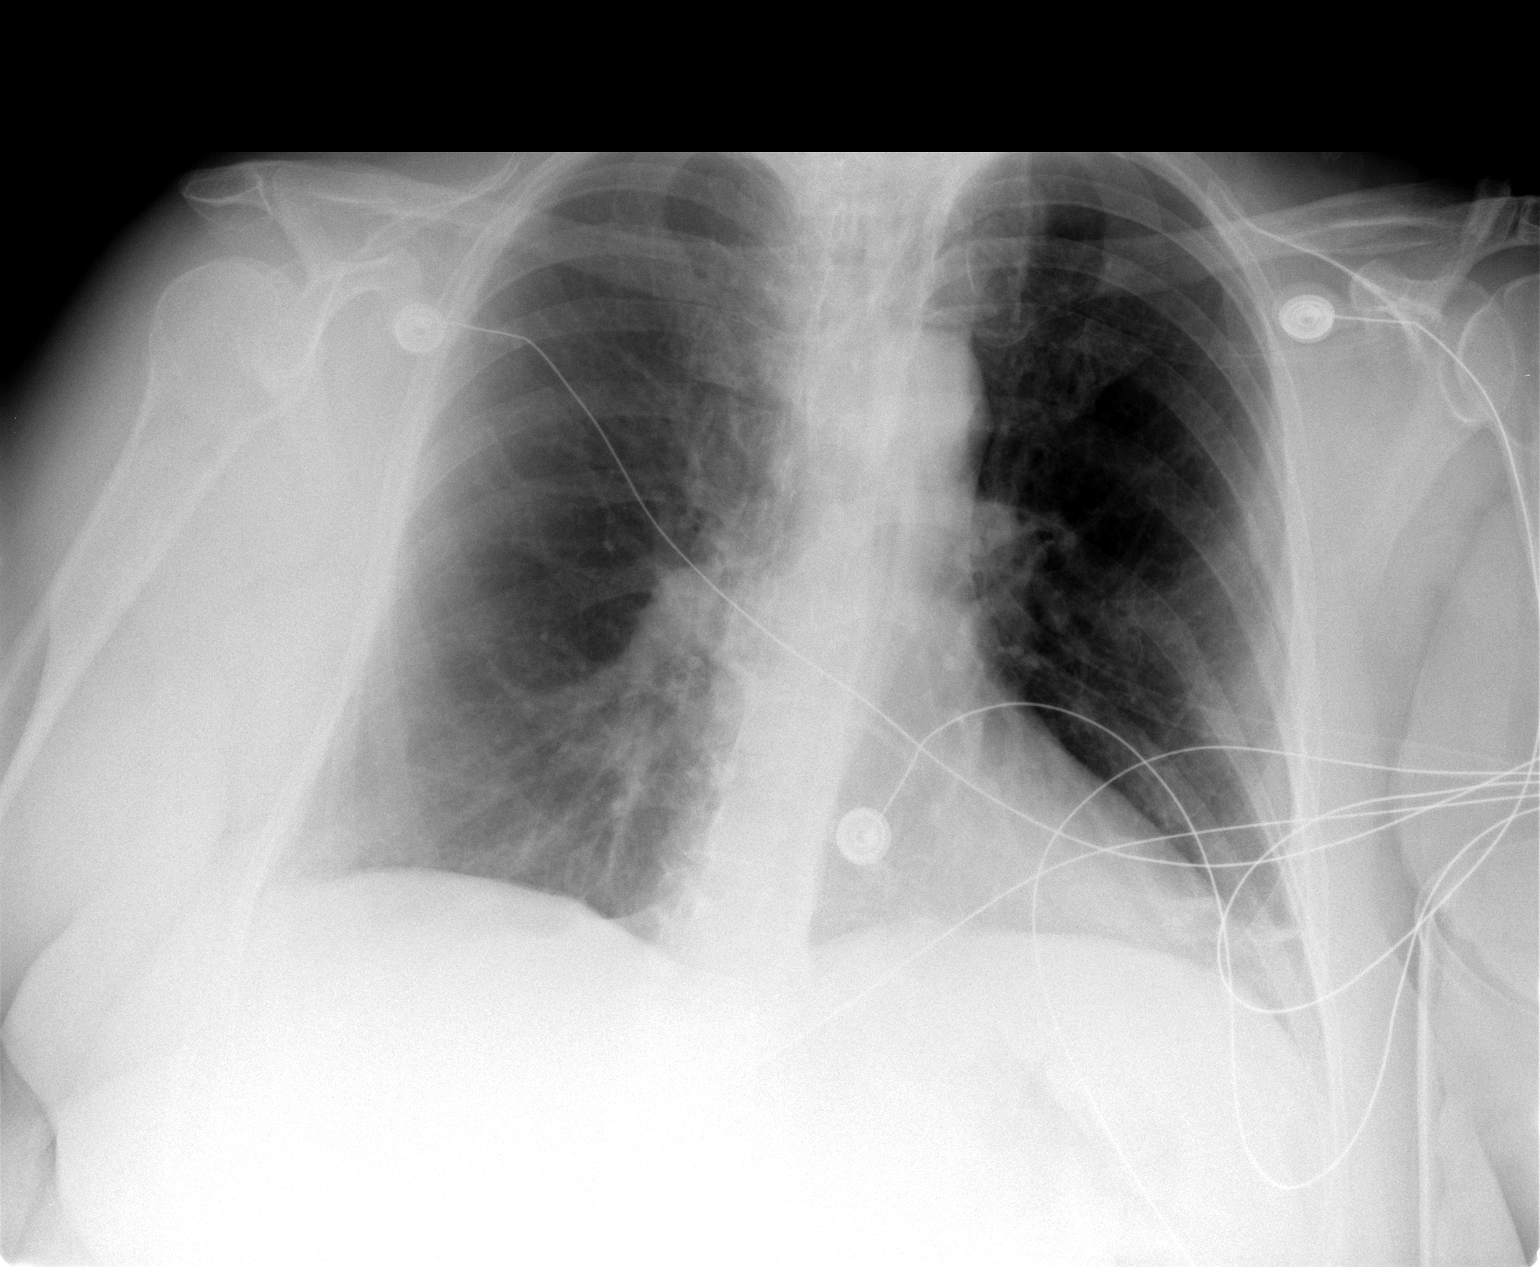

[1 of 1 positions shown; findings below may reference images not displayed]

FINDINGS: Chest is hyperexpanded.  There is minimal atelectasis in the left base.  Lungs are otherwise clear.  Heart size is normal.    No effusion.
IMPRESSION: Minimal left base subsegmental atelectasis.  No acute disease.

## 2006-08-04 ENCOUNTER — Emergency Department (HOSPITAL_COMMUNITY): Admission: EM | Admit: 2006-08-04 | Discharge: 2006-08-05 | Payer: Self-pay | Admitting: Emergency Medicine

## 2006-08-04 ENCOUNTER — Emergency Department (HOSPITAL_COMMUNITY): Admission: EM | Admit: 2006-08-04 | Discharge: 2006-08-04 | Payer: Self-pay | Admitting: Emergency Medicine

## 2006-08-06 ENCOUNTER — Emergency Department (HOSPITAL_COMMUNITY): Admission: EM | Admit: 2006-08-06 | Discharge: 2006-08-06 | Payer: Self-pay | Admitting: Emergency Medicine

## 2006-08-08 ENCOUNTER — Emergency Department (HOSPITAL_COMMUNITY): Admission: EM | Admit: 2006-08-08 | Discharge: 2006-08-08 | Payer: Self-pay | Admitting: Emergency Medicine

## 2006-08-10 ENCOUNTER — Encounter: Payer: Self-pay | Admitting: Emergency Medicine

## 2006-08-12 ENCOUNTER — Ambulatory Visit: Payer: Self-pay | Admitting: Family Medicine

## 2006-08-12 ENCOUNTER — Inpatient Hospital Stay (HOSPITAL_COMMUNITY): Admission: EM | Admit: 2006-08-12 | Discharge: 2006-08-18 | Payer: Self-pay | Admitting: Emergency Medicine

## 2006-08-24 IMAGING — CT CT HEAD W/O CM
5 of 6 series · 18 of 47 positions shown, 19 images · IV contrast (agent unspecified)
Comparison: none

CLINICAL DATA: Unresponsive.  Found on floor by housekeeper.  
HEAD CT WITHOUT CONTRAST:
TECHNIQUE: Contiguous axial images were obtained from the base of the skull through the vertex according to standard protocol without contrast.
TECHNIQUE: Multidetector CT imaging of the cervical spine was performed.  Multiplanar CT image reconstructions were also generated.

[Series 2: brain · axial · 0.47mm/px · z∈[-100,-15]mm · 3 of 32 slices shown, 4 images]
[im 8/32  brain]
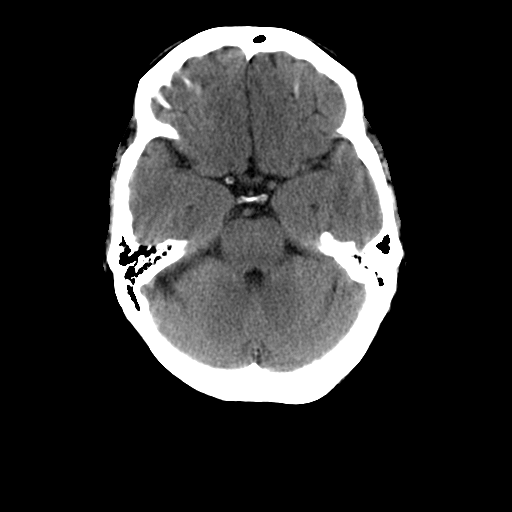
[im 8/32  bone]
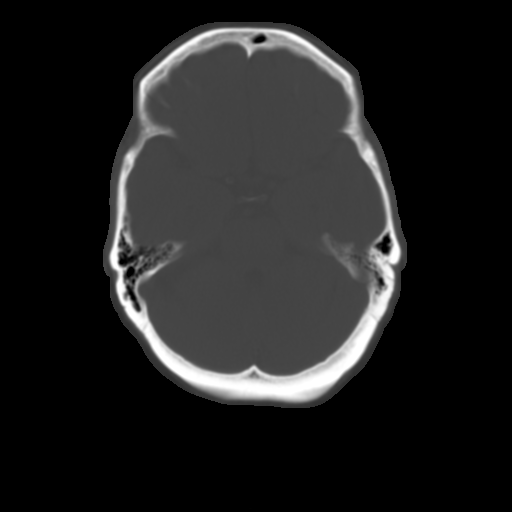
[im 16/32  brain]
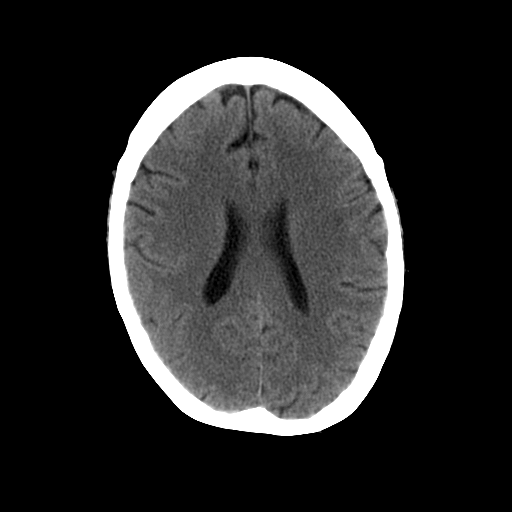
[im 24/32  brain]
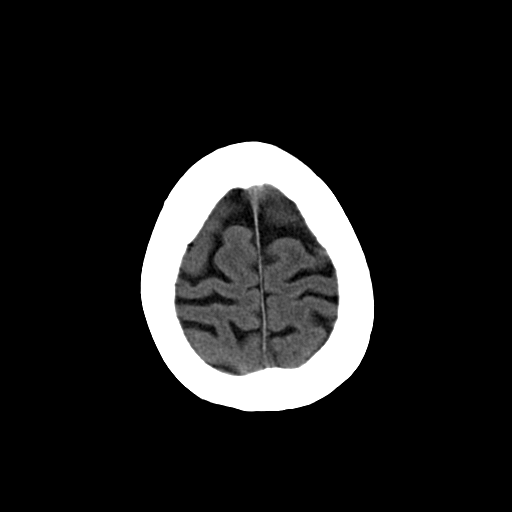

[Series 3: recon 2: brain · axial · 0.47mm/px · z∈[-100,-15]mm · 3 of 32 slices shown]
[im 8/32  brain]
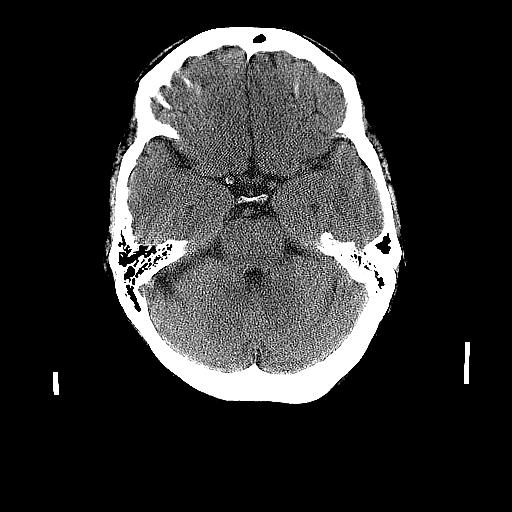
[im 16/32  brain]
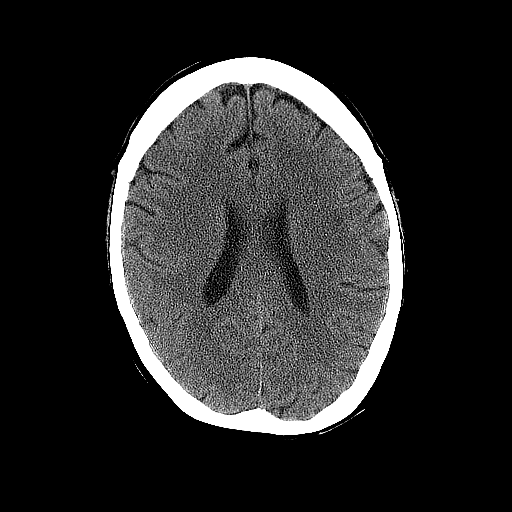
[im 24/32  brain]
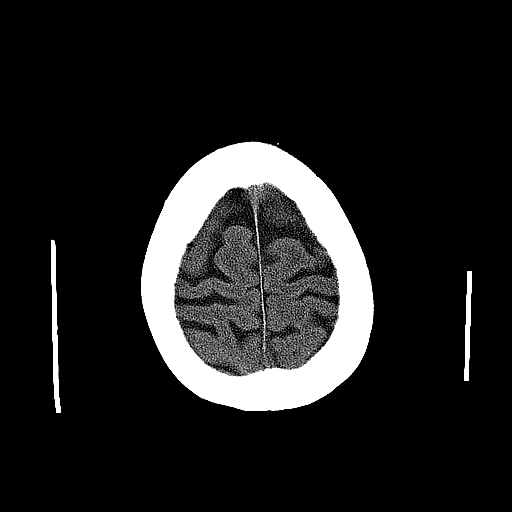

[Series 5: cervical spine · axial · 0.27mm/px · z∈[-306,-208]mm · 6 of 71 slices shown]
[im 8/71  brain]
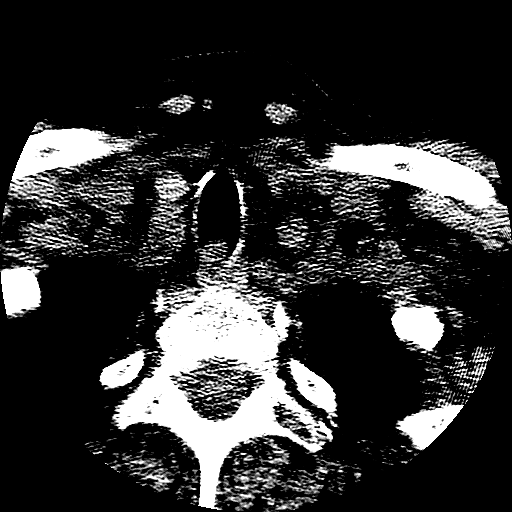
[im 16/71  brain]
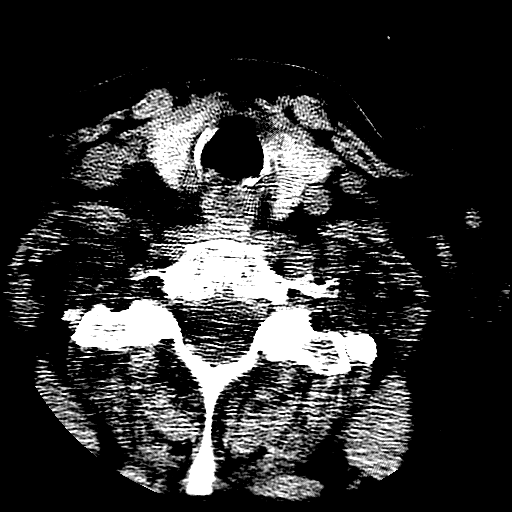
[im 24/71  brain]
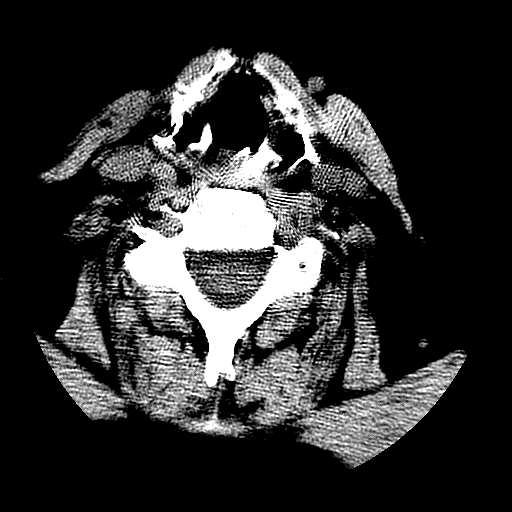
[im 32/71  brain]
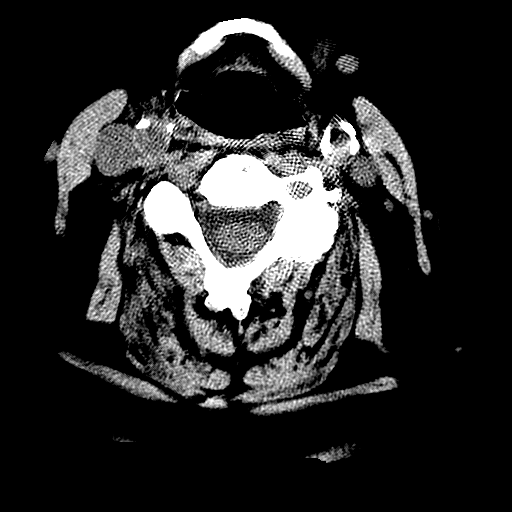
[im 39/71  brain]
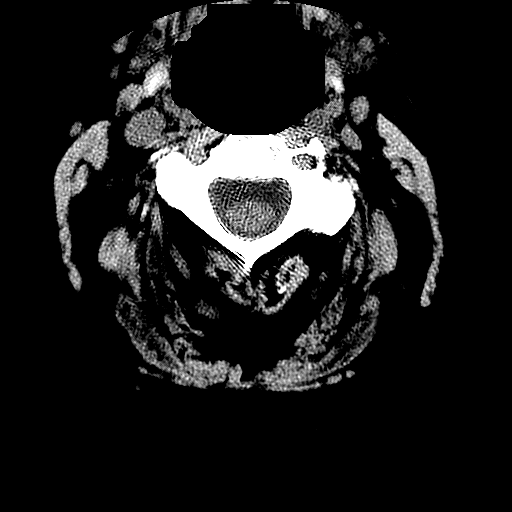
[im 47/71  brain]
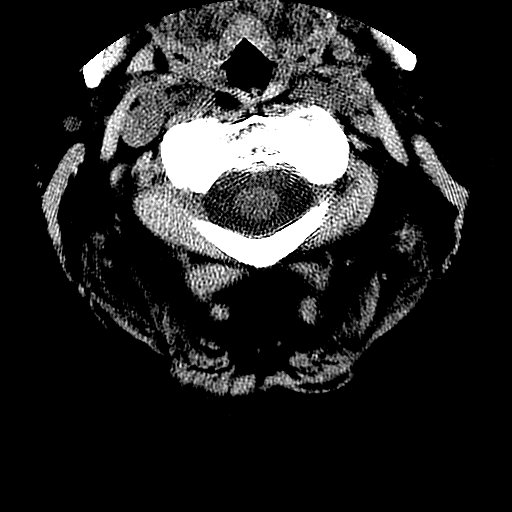

[Series 700: reformatted · sagittal · 0.36mm/px · 3 of 36 slices shown (1 of 2)]
[im 12/36  brain]
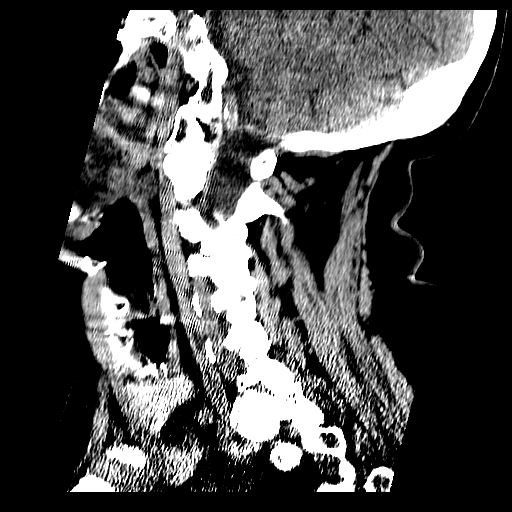
[im 18/36  brain]
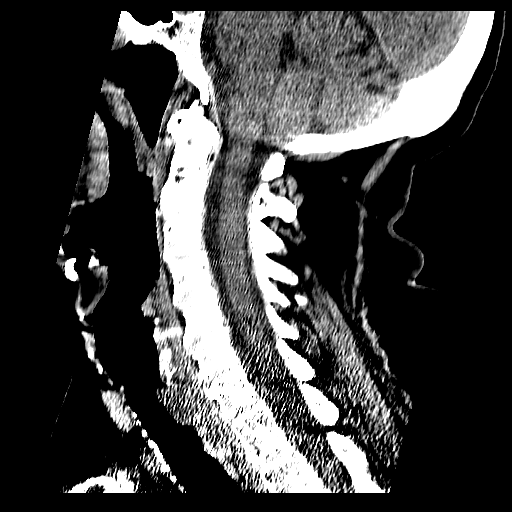
[im 24/36  brain]
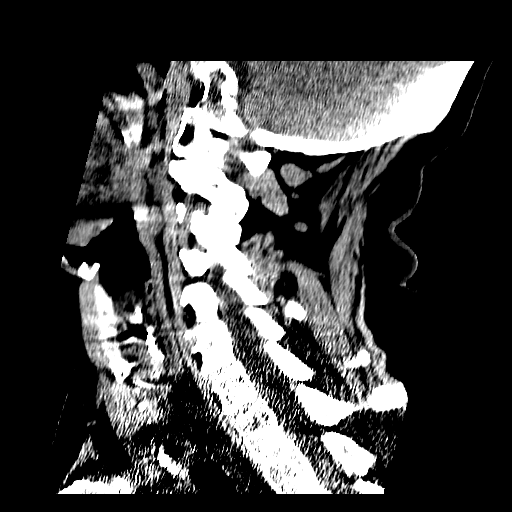

[Series 701: reformatted · coronal · 0.36mm/px · 3 of 43 slices shown (2 of 2)]
[im 15/43  brain]
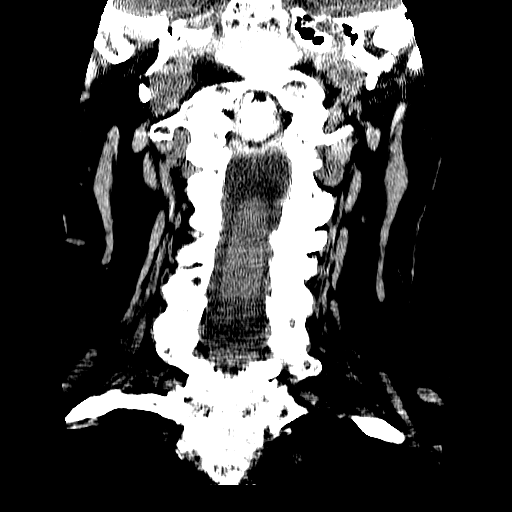
[im 19/43  brain]
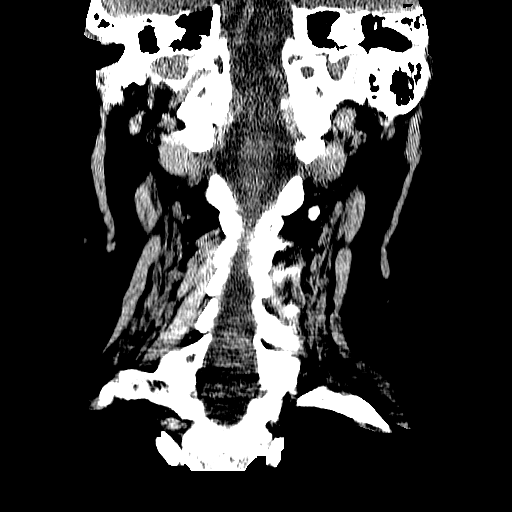
[im 24/43  brain]
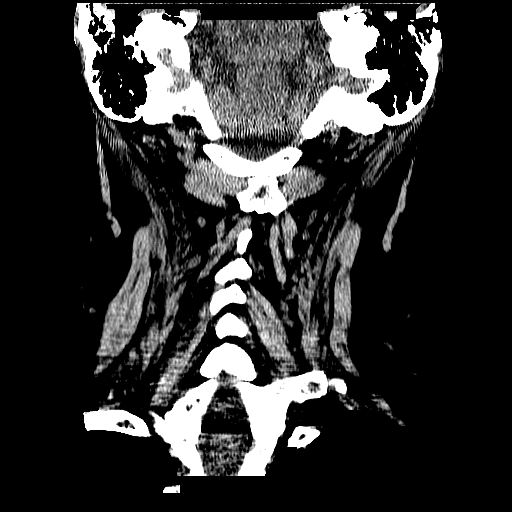

[18 of 47 positions shown; findings below may reference images not displayed]

FINDINGS: Mild atrophy consistent with the patient?s age.  Mild hypodensity of the white matter suggests chronic microvascular ischemic change.  No areas of hemorrhage, mass effect, or definite acute infarction.  Scattered areas of prominent perivascular spaces noted.  Bone windows reveal premature vascular calcification in the carotid siphons.  Paranasal sinuses and mastoids are clear.
IMPRESSION: Mild atrophy without acute findings.
CERVICAL SPINE CT WITHOUT CONTRAST:
FINDINGS: Degenerative change C1-2.  Mild end plate softening multiple levels but no acute fracture or subluxation.  No gross evidence for epidural hematoma or neck mass.  No pneumothorax visible in the observed lung segments.  Moderate degenerative changes in the facets but no vertebral body fracture, facet slippage or prevertebral soft tissue swelling.
IMPRESSION: Mild degenerative change but no acute abnormality demonstrated.

## 2006-08-24 IMAGING — CR DG CHEST 1V PORT
1 series · 1 of 1 positions shown · non-contrast
Comparison: 07/20/06.

CLINICAL DATA: Unresponsive.
 PORTABLE CHEST - 1 VIEW (9955 hours):

[view not recorded]
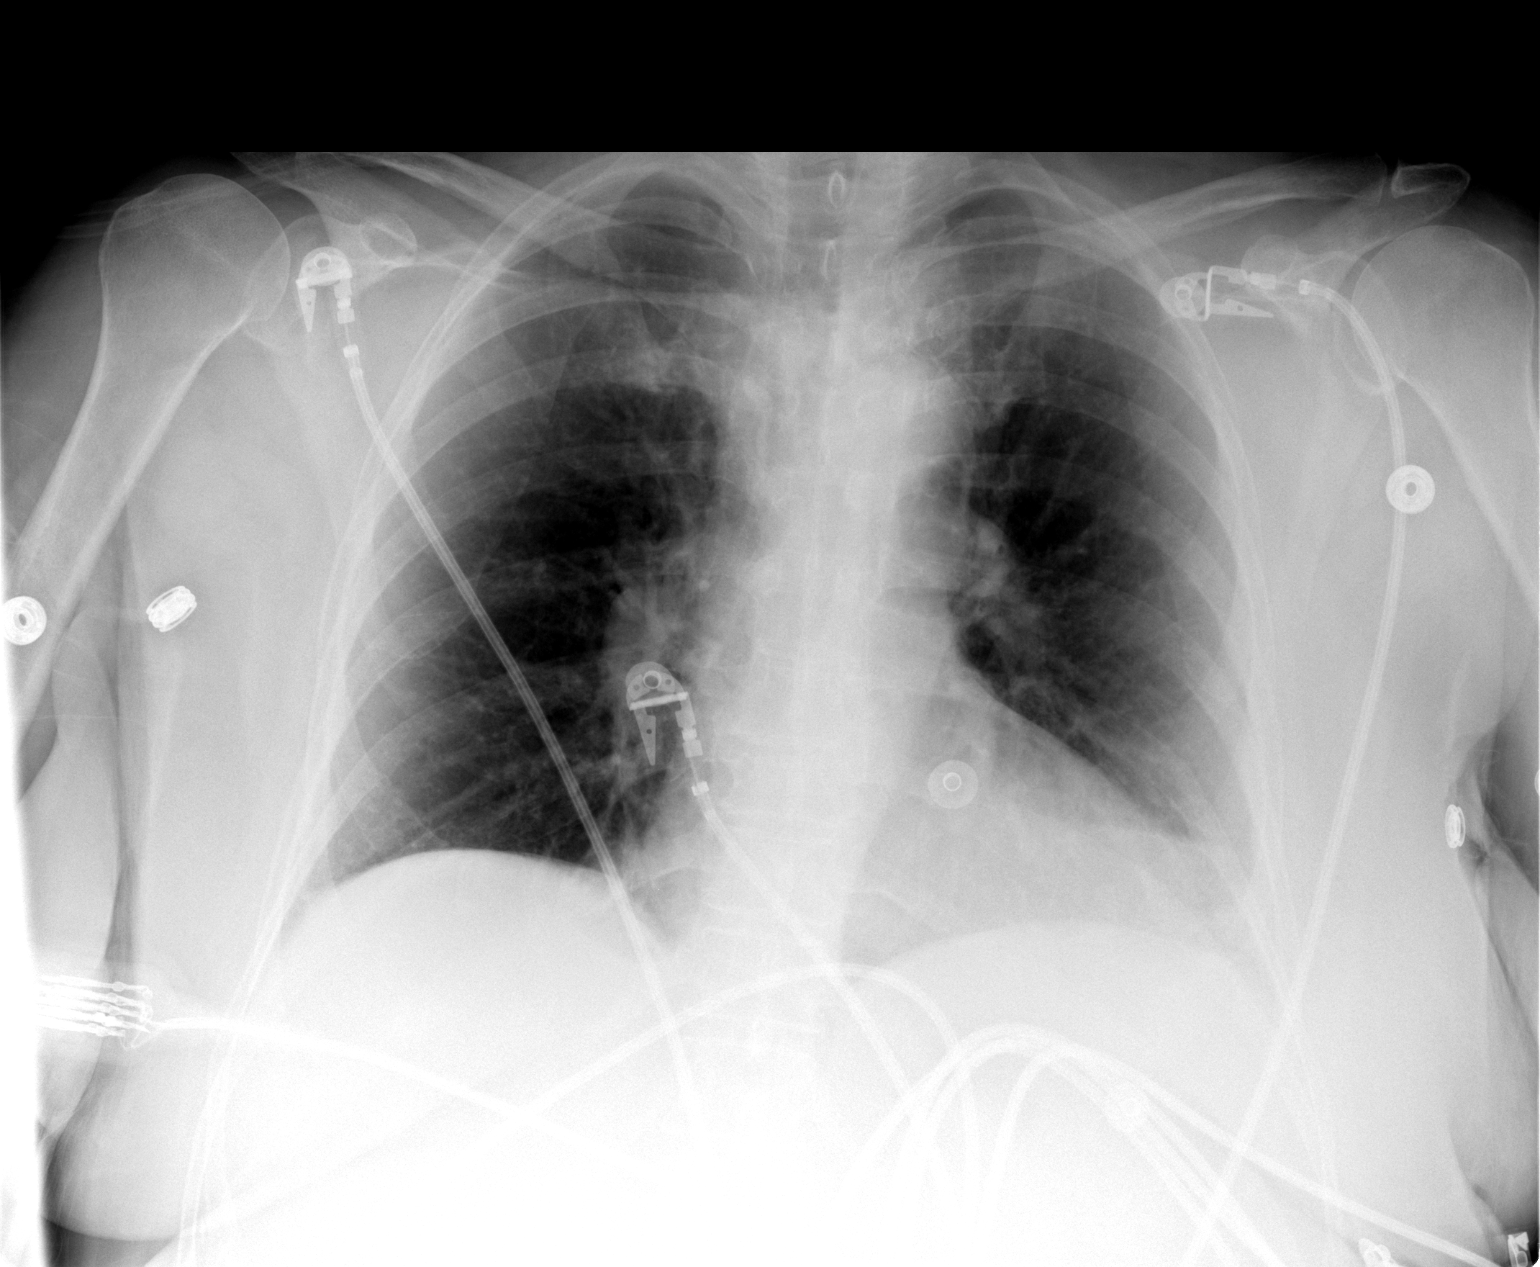

[1 of 1 positions shown; findings below may reference images not displayed]

FINDINGS: There is a relatively poor inspiratory effort.  Cardiomegaly with changes of COPD are noted. No active infiltrates, failure, pneumothorax or effusion.  Degenerative changes thoracic spine.  Little change from film 2 days earlier.
IMPRESSION: No active cardiopulmonary disease.

## 2006-08-26 ENCOUNTER — Emergency Department (HOSPITAL_COMMUNITY): Admission: EM | Admit: 2006-08-26 | Discharge: 2006-08-26 | Payer: Self-pay | Admitting: *Deleted

## 2006-08-26 IMAGING — CR DG ELBOW 2V*R*
2 series · 2 of 2 positions shown · non-contrast
Comparison: none

CLINICAL DATA: Patient fell a couple of days ago and has right elbow pain.
 RIGHT ELBOW - 2 VIEW:

[view not recorded (1 of 2)]
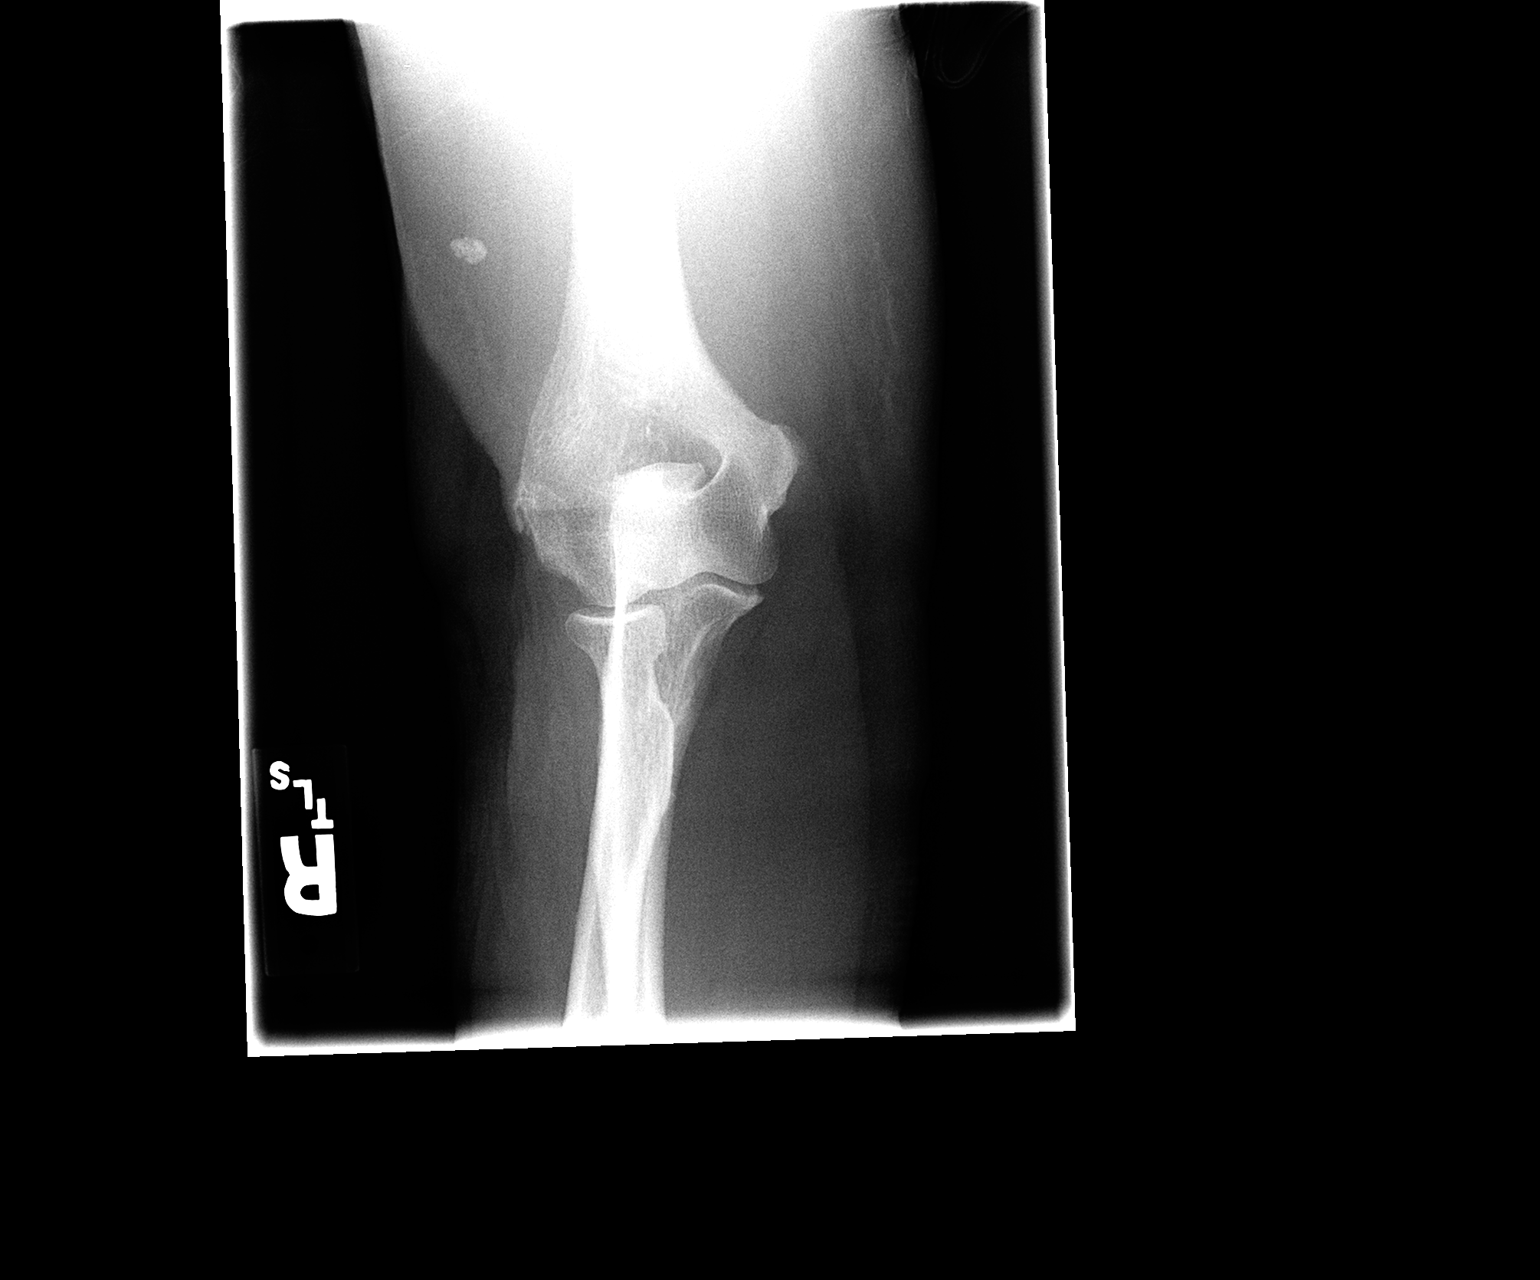

[view not recorded (2 of 2)]
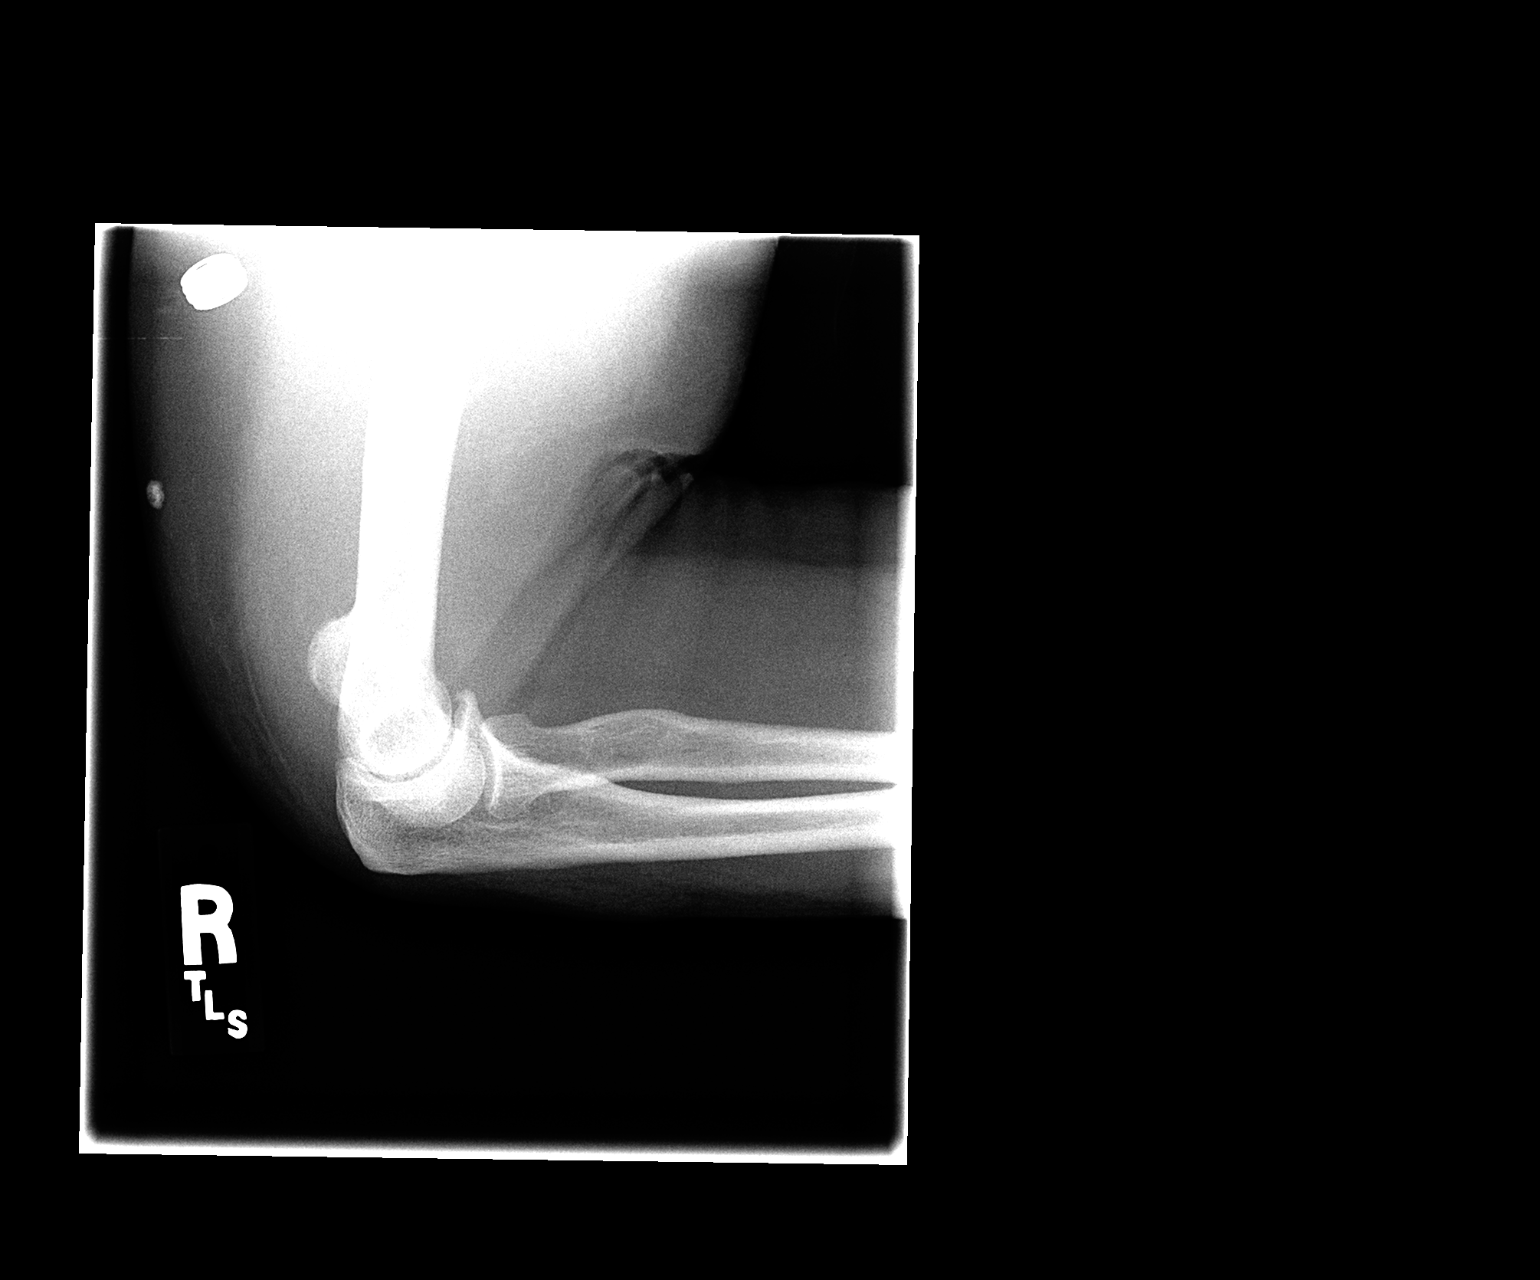

[2 of 2 positions shown; findings below may reference images not displayed]

FINDINGS: No fracture or subluxation.  No definite joint effusion.  Mild degenerative changes, humero-ulnar articulation.  5.4 x 7.8 mm calcific density in the superficial soft tissues at the level of the distal humerus, posteriorly and laterally located and of undetermined etiology.  This may represent foreign material within or superficial to the skin surface.
IMPRESSION: No acute right elbow bony abnormality.

## 2006-09-02 IMAGING — CR DG CHEST 2V
2 series · 2 of 2 positions shown · non-contrast
Comparison: 07/22/06.

CLINICAL DATA: Shortness of breath.  Dizziness.  COPD. 
 CHEST - 2 VIEW:

[w chest lat]
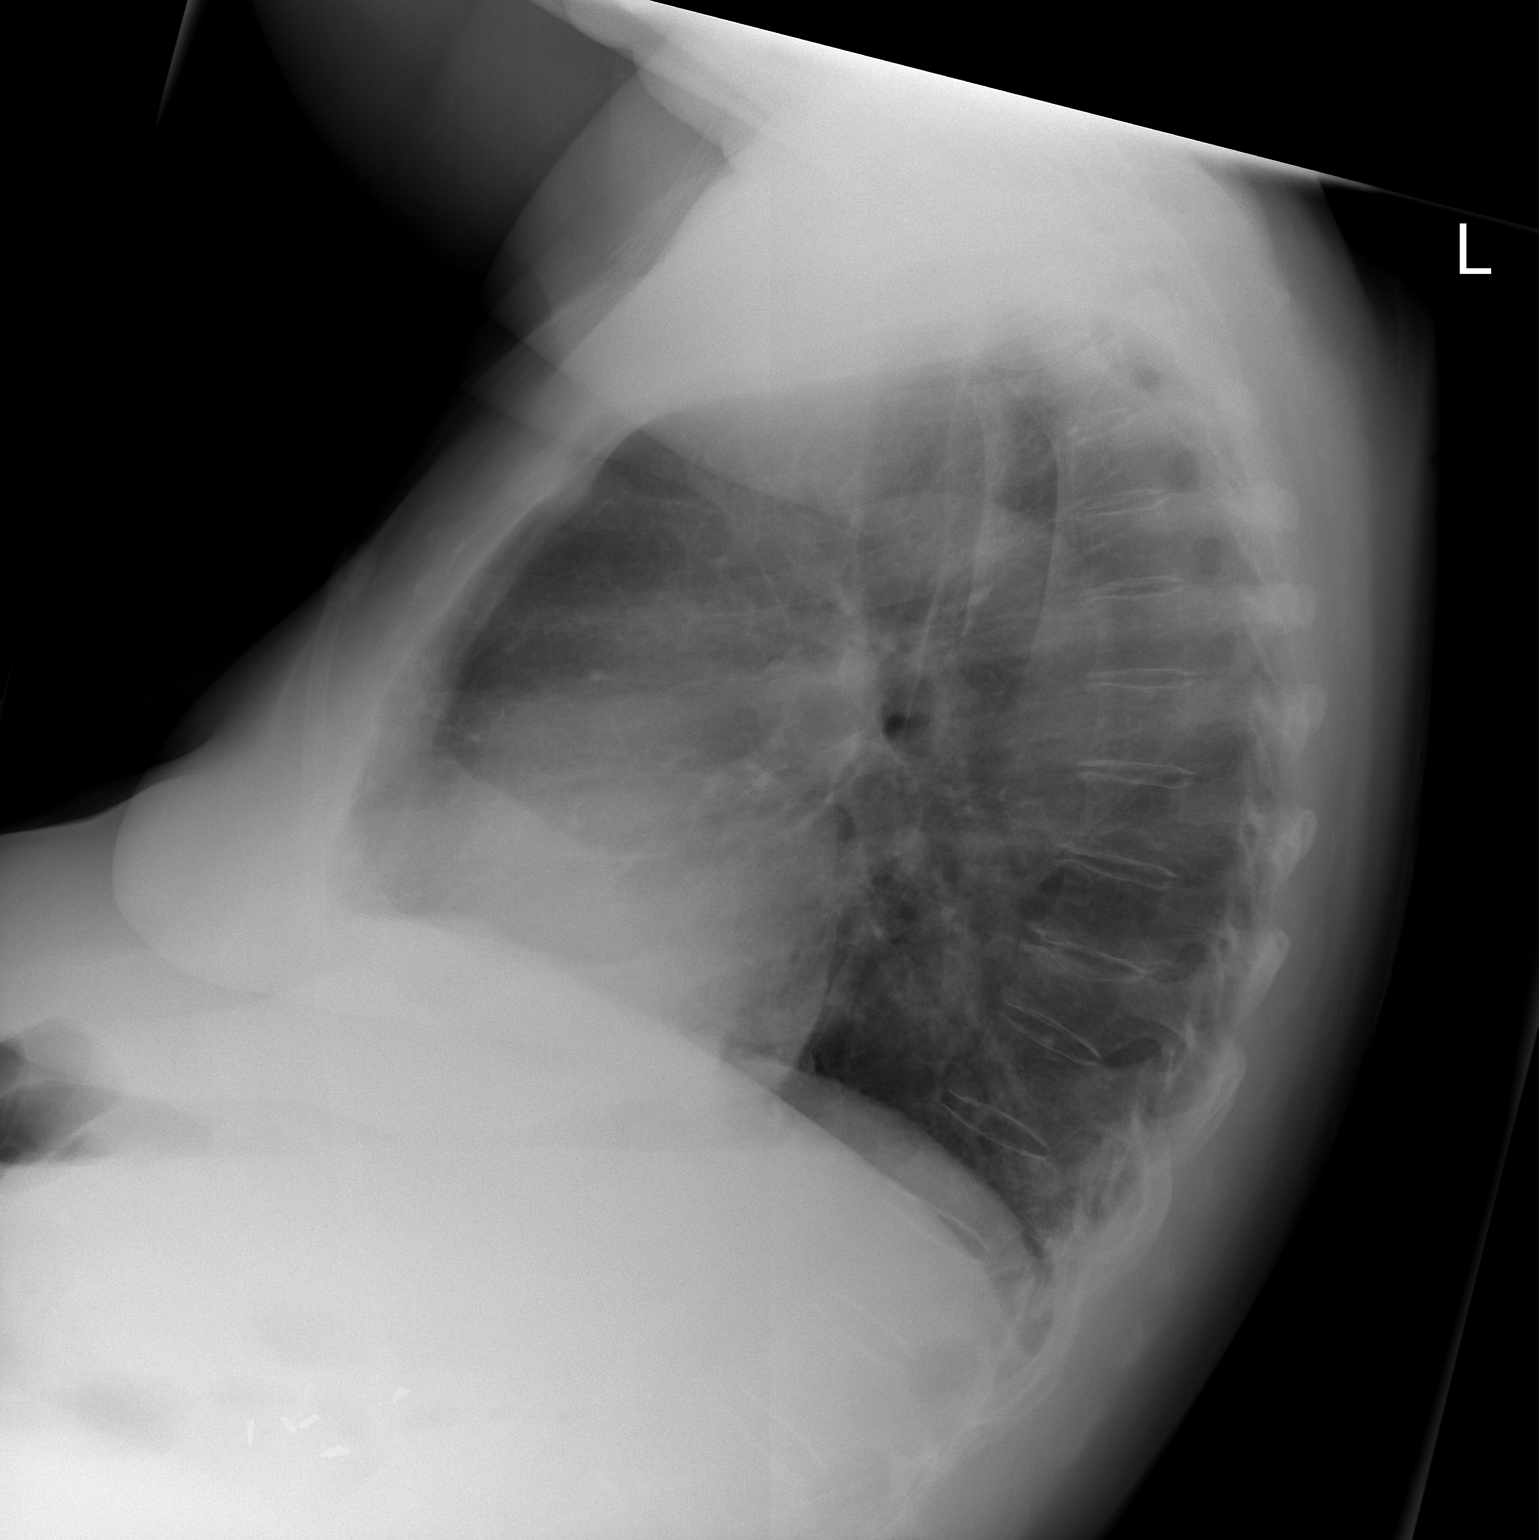

[view not recorded]
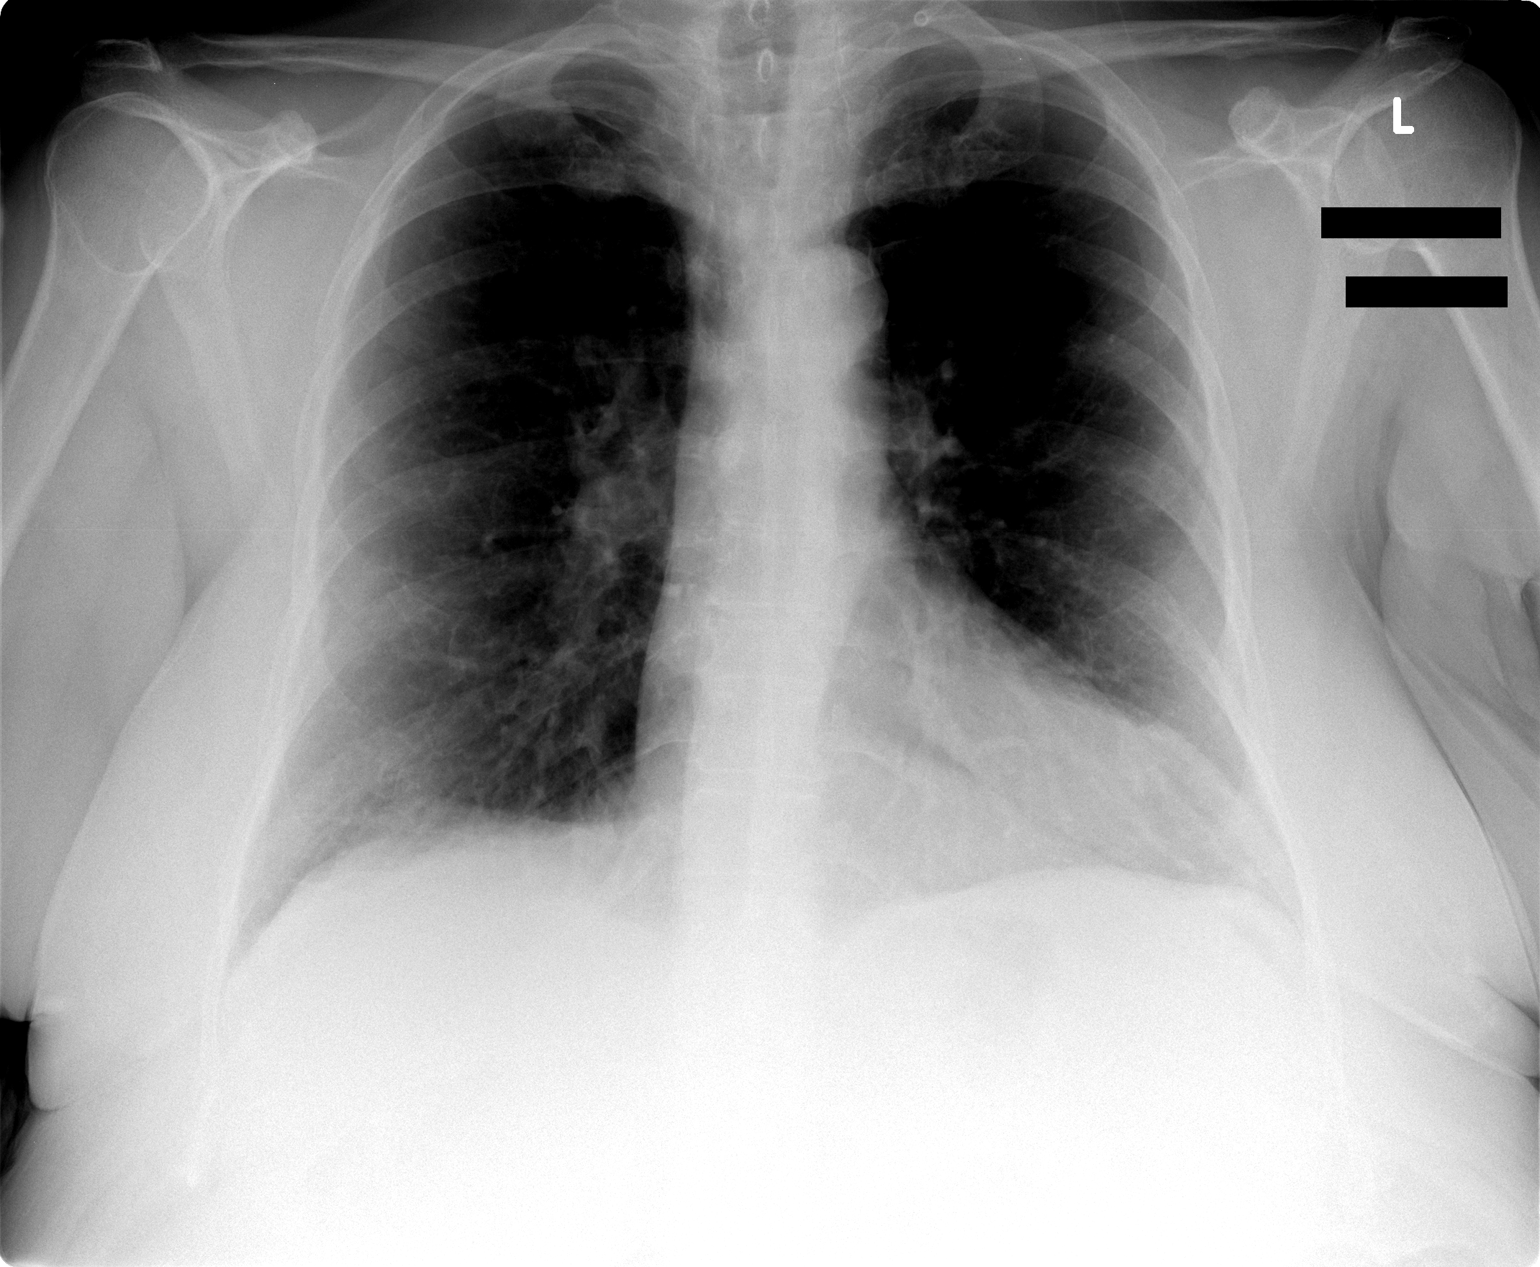

[2 of 2 positions shown; findings below may reference images not displayed]

FINDINGS: Heart size remains at the upper limits of normal but is stable.  Both lungs are clear.  There is no evidence of pleural effusion.  No mass or adenopathy identified.
IMPRESSION: Stable chest.  No active disease.

## 2006-09-03 IMAGING — CR DG CHEST 2V
2 series · 2 of 2 positions shown · non-contrast
Comparison: 07/31/06.

CLINICAL DATA: Headache/shortness of breath. 
 CHEST - 2 VIEW:

[w chest pa]
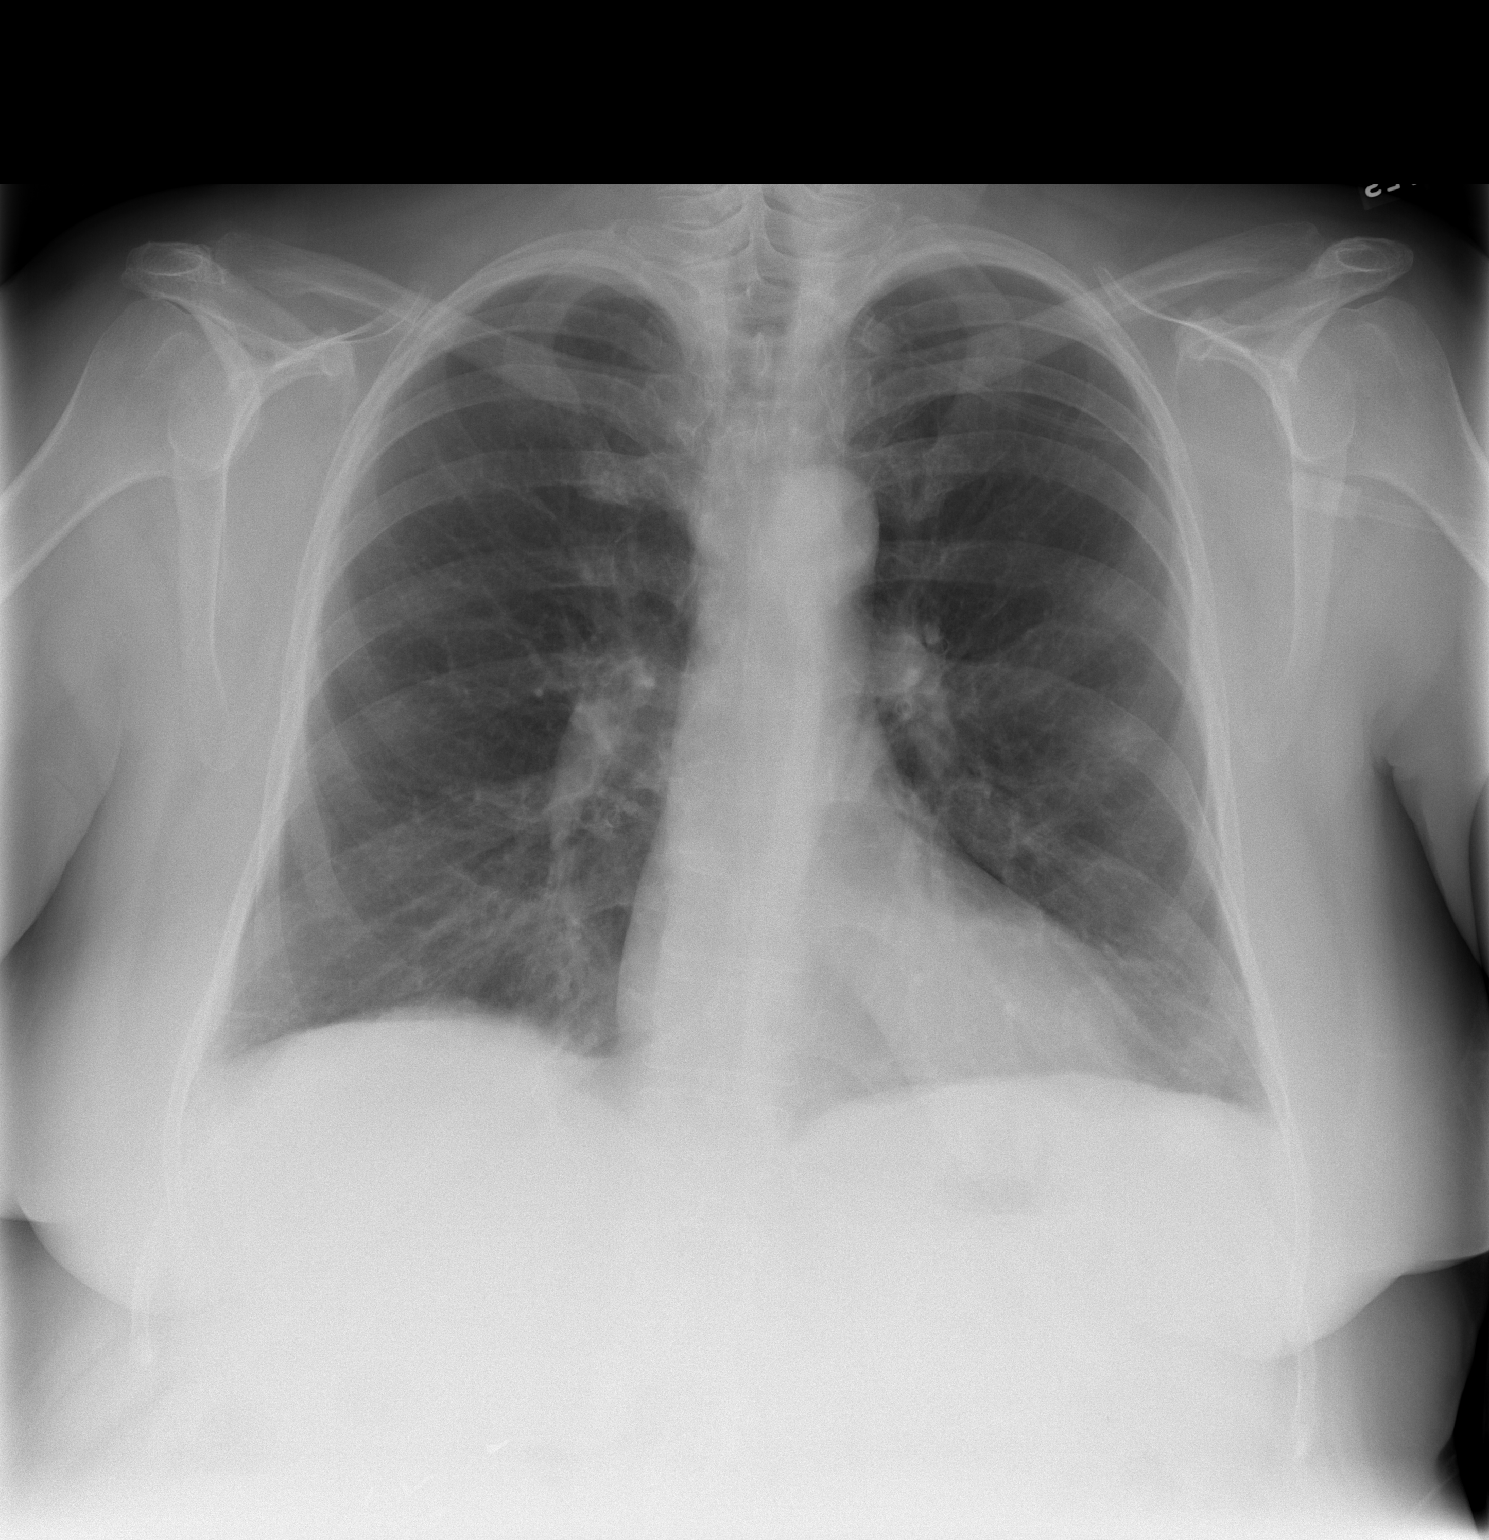

[w chest lat]
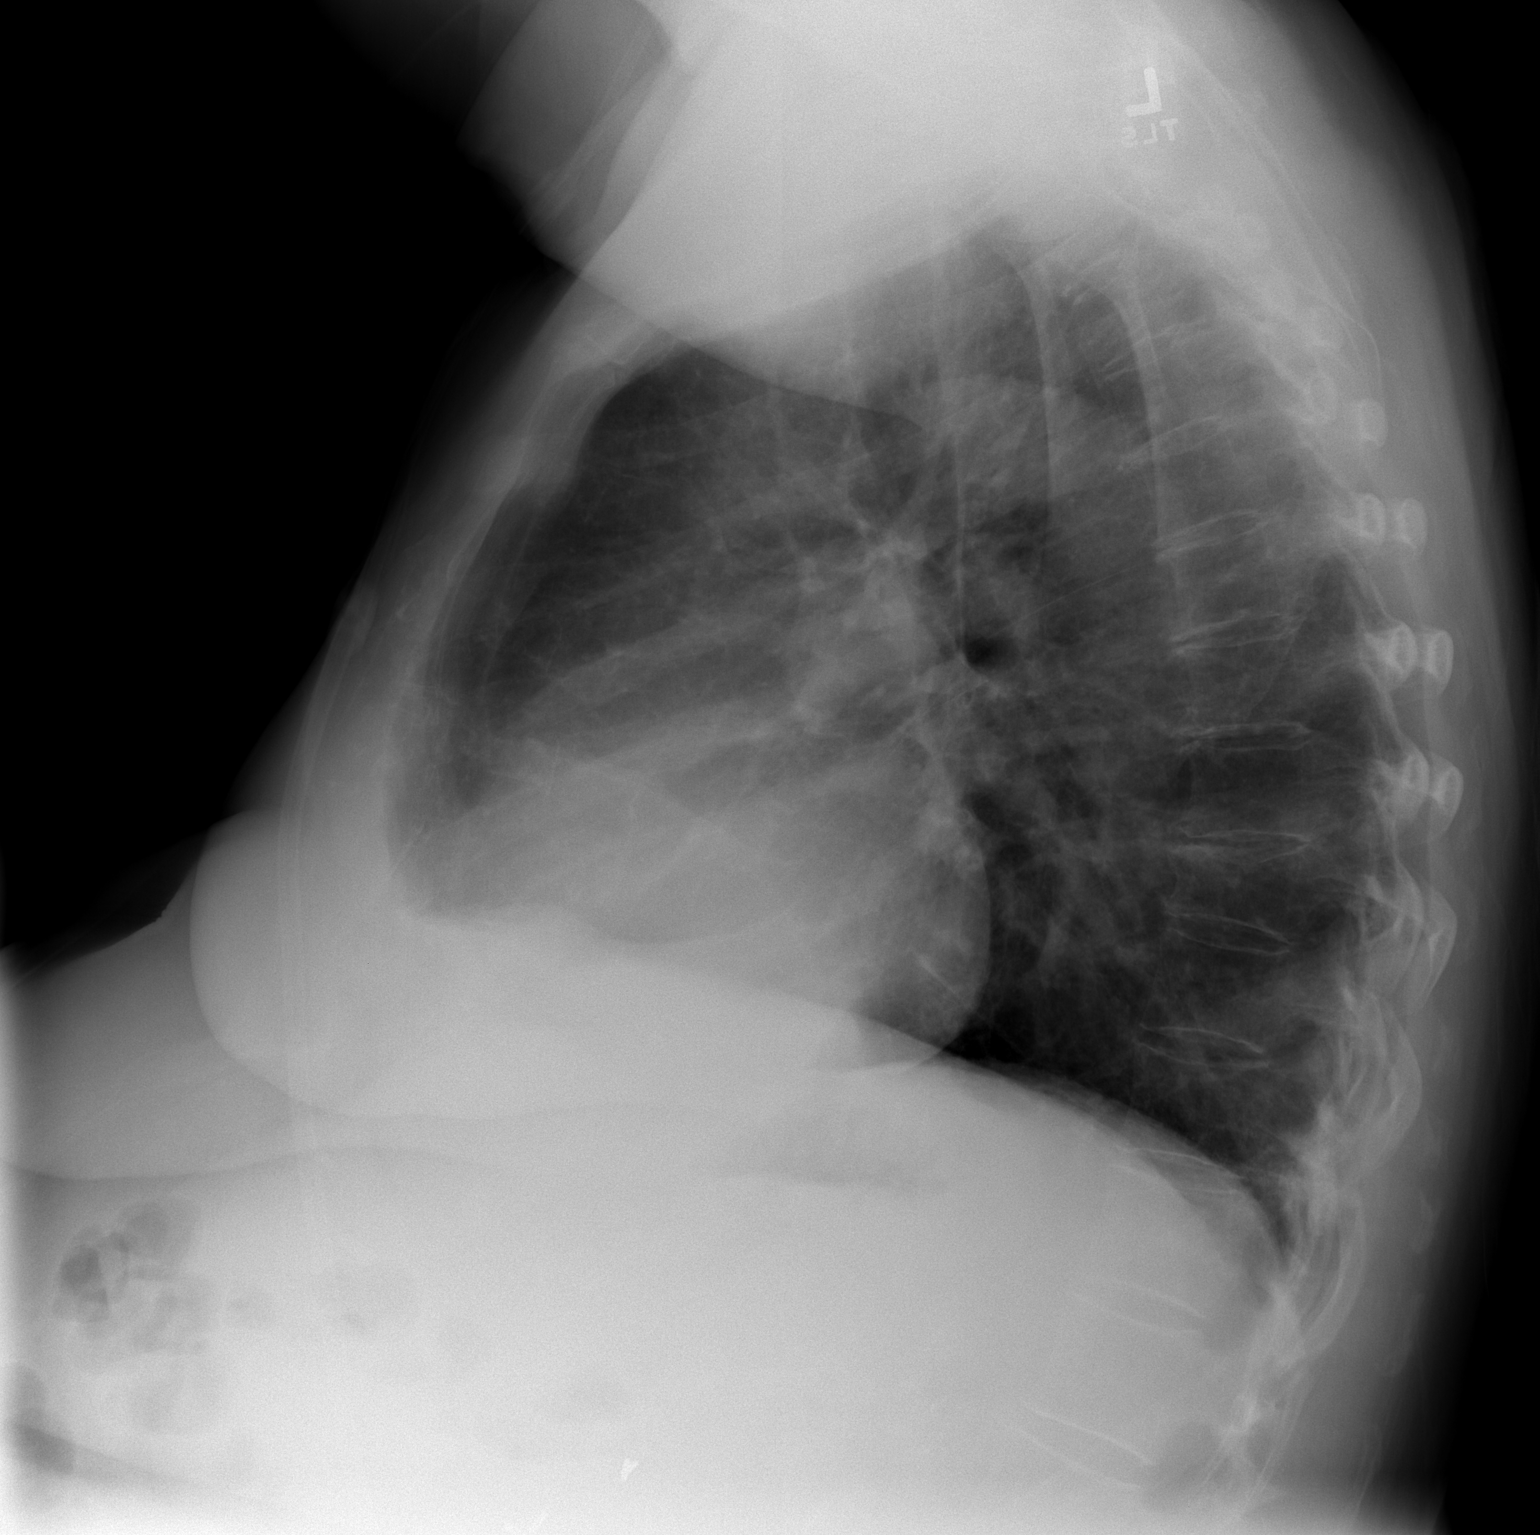

[2 of 2 positions shown; findings below may reference images not displayed]

FINDINGS: Heart size within normal limits.  Lungs hyperaerated consistent with COPD.  No active process or heart failure.
IMPRESSION: COPD ? no active disease.

## 2006-09-04 IMAGING — CR DG KNEE COMPLETE 4+V*R*
4 series · 4 of 4 positions shown · non-contrast
Comparison: None.

CLINICAL DATA: Fell ? pain and bruising over right patella. 
 RIGHT KNEE ? 4 VIEW:

[view not recorded (1 of 4)]
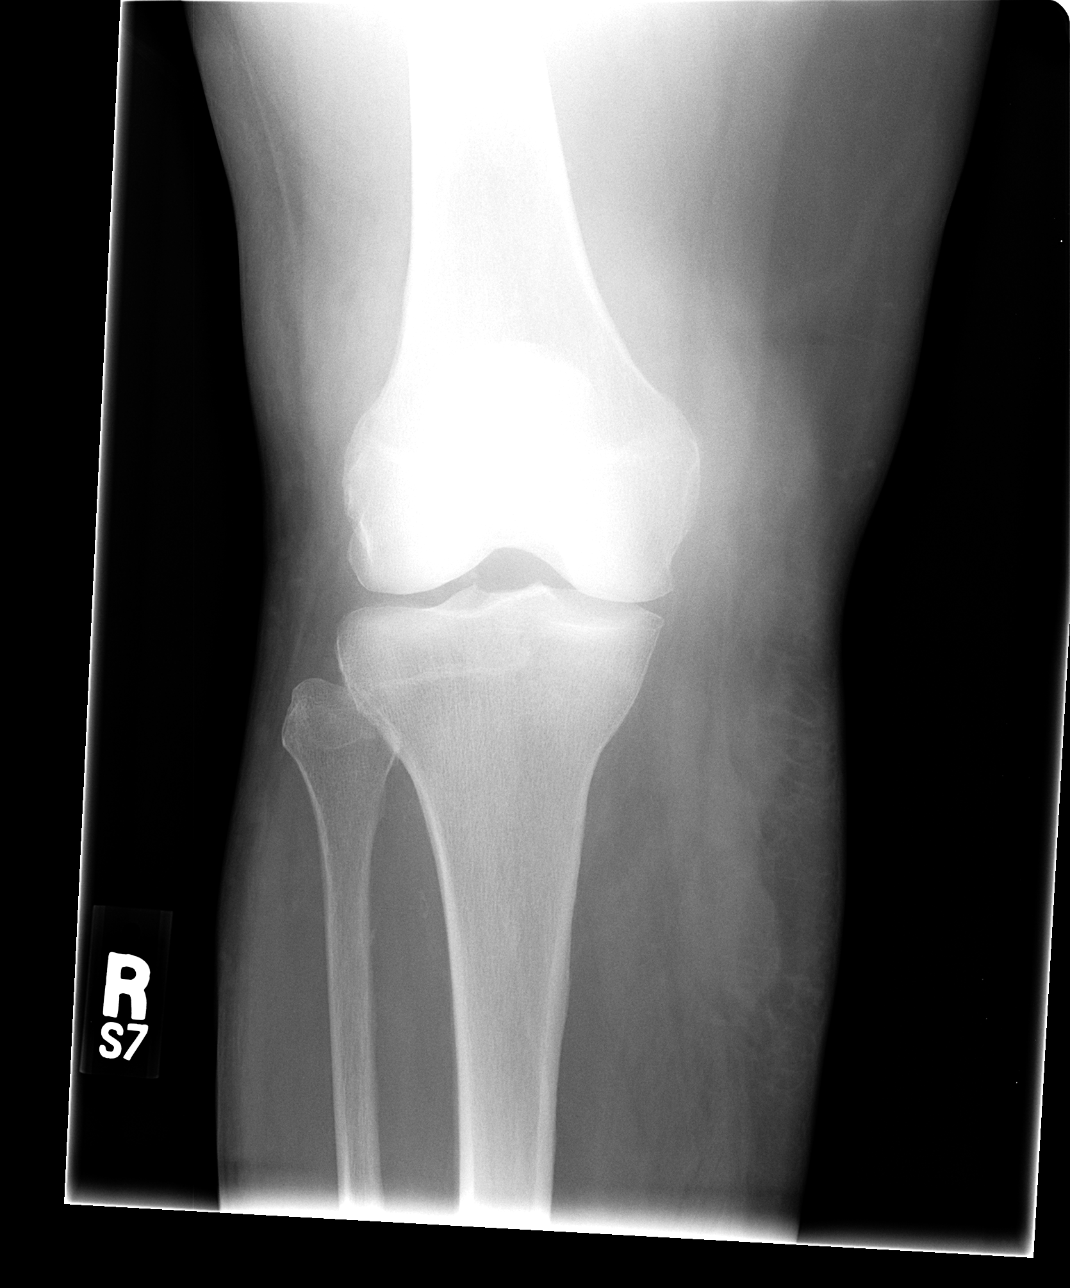

[view not recorded (2 of 4)]
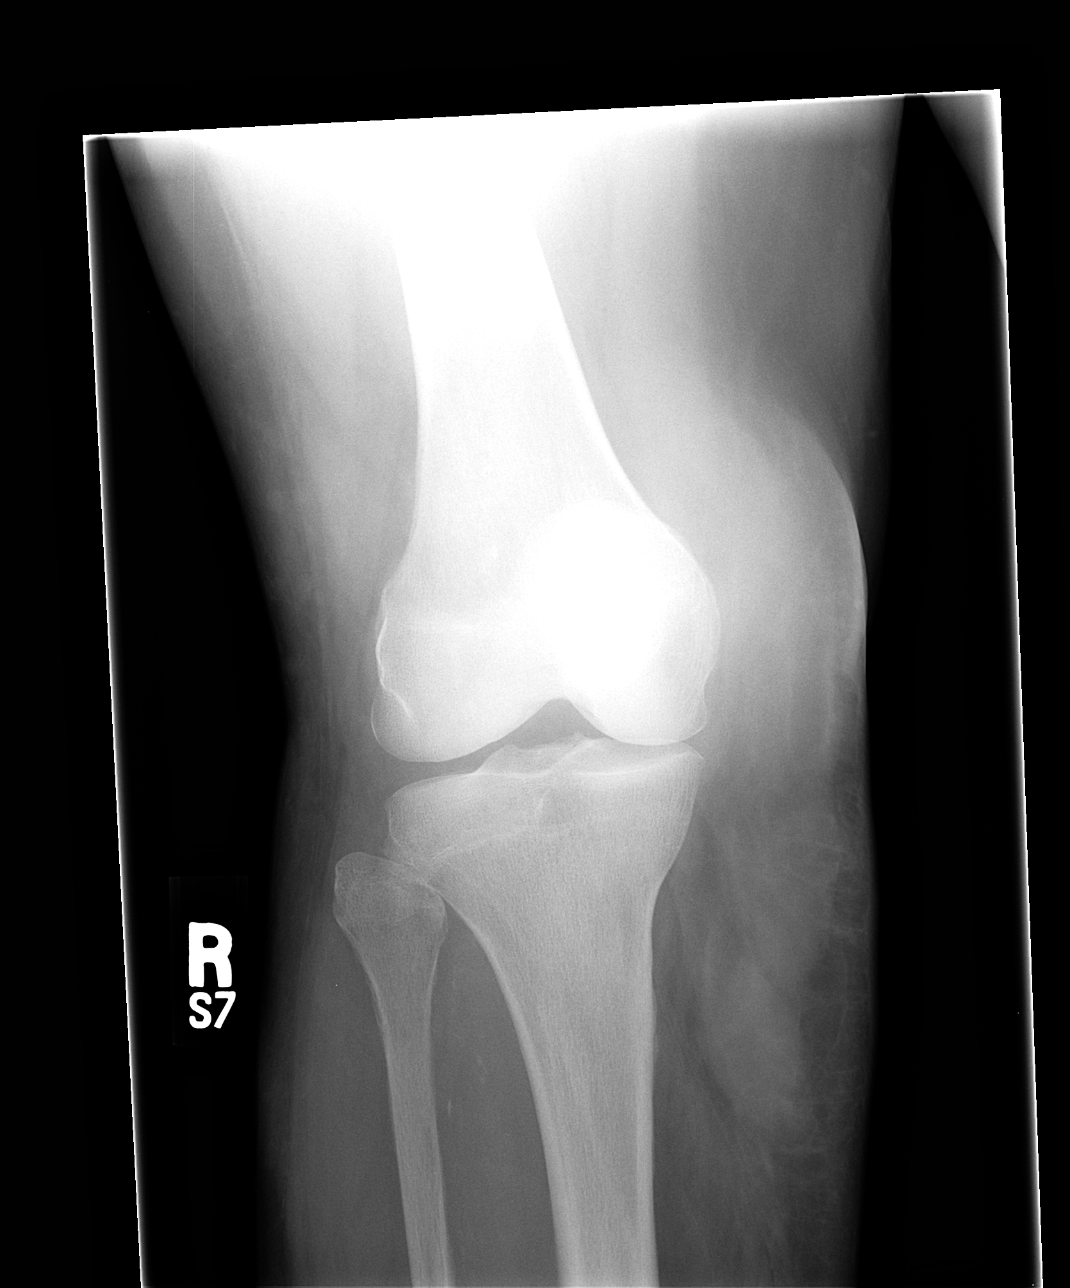

[view not recorded (3 of 4)]
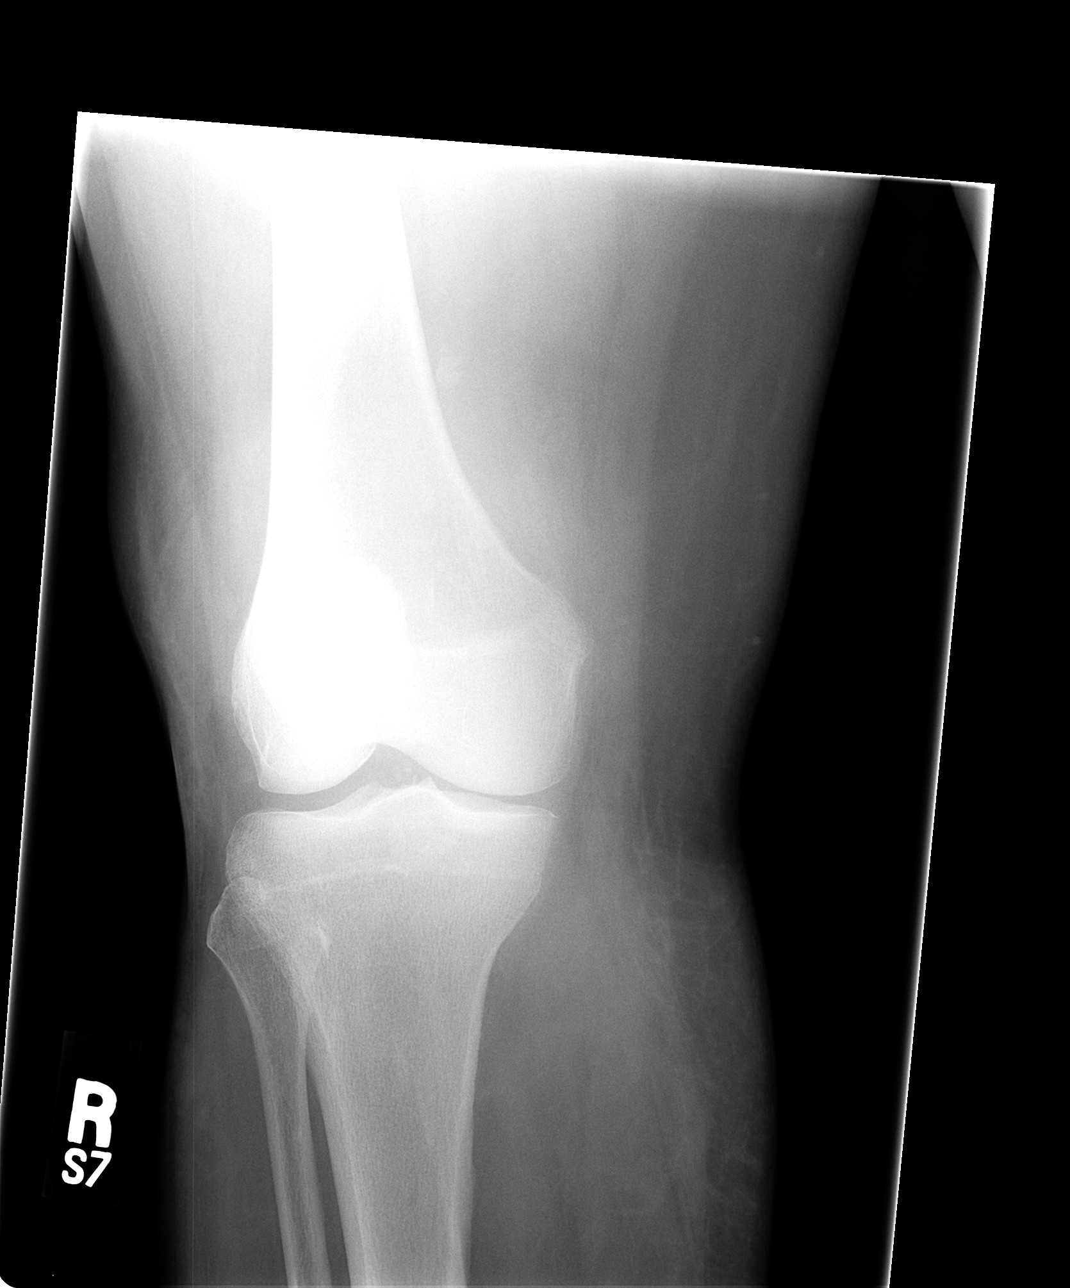

[view not recorded (4 of 4)]
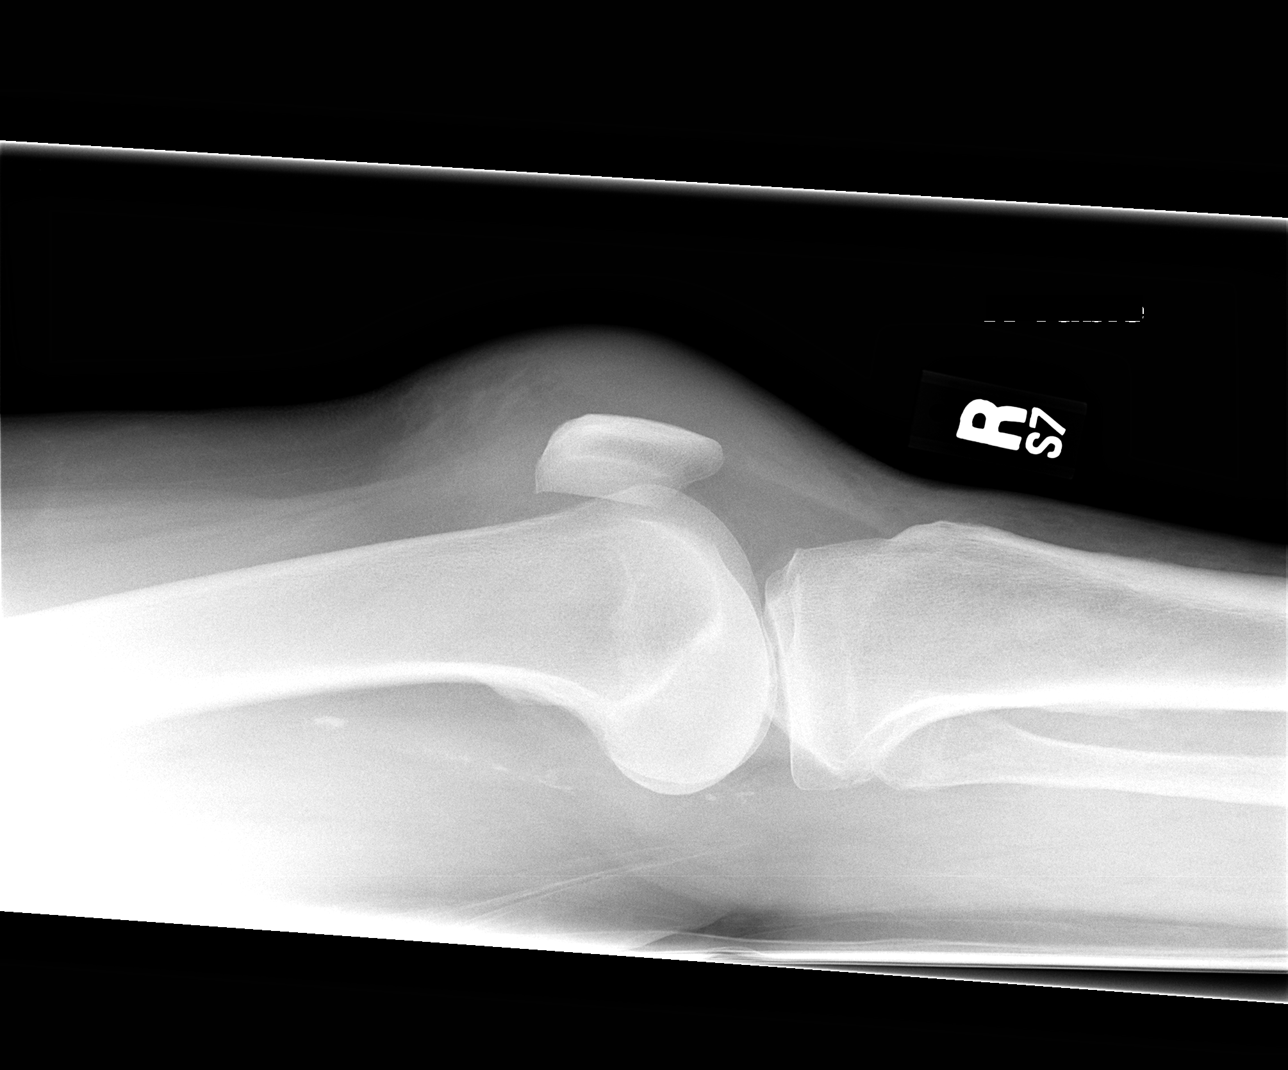

[4 of 4 positions shown; findings below may reference images not displayed]

FINDINGS: Routine views were obtained (the lateral view was done cross-table lateral).  There is no fracture or dislocation.  There is soft tissue swelling anterior to the patella but no joint effusion.  There are SFA and popliteal artery calcifications.
IMPRESSION: 1.  Soft tissue swelling anterior to the patella ? no fracture or other acute changes. 
 2.  Vascular calcifications.

## 2006-09-06 IMAGING — CR DG CHEST 2V
2 series · 2 of 2 positions shown · non-contrast
Comparison: 08/01/06.

CLINICAL DATA: Shortness of breath.
 CHEST ? 2 VIEW:

[w chest lat]
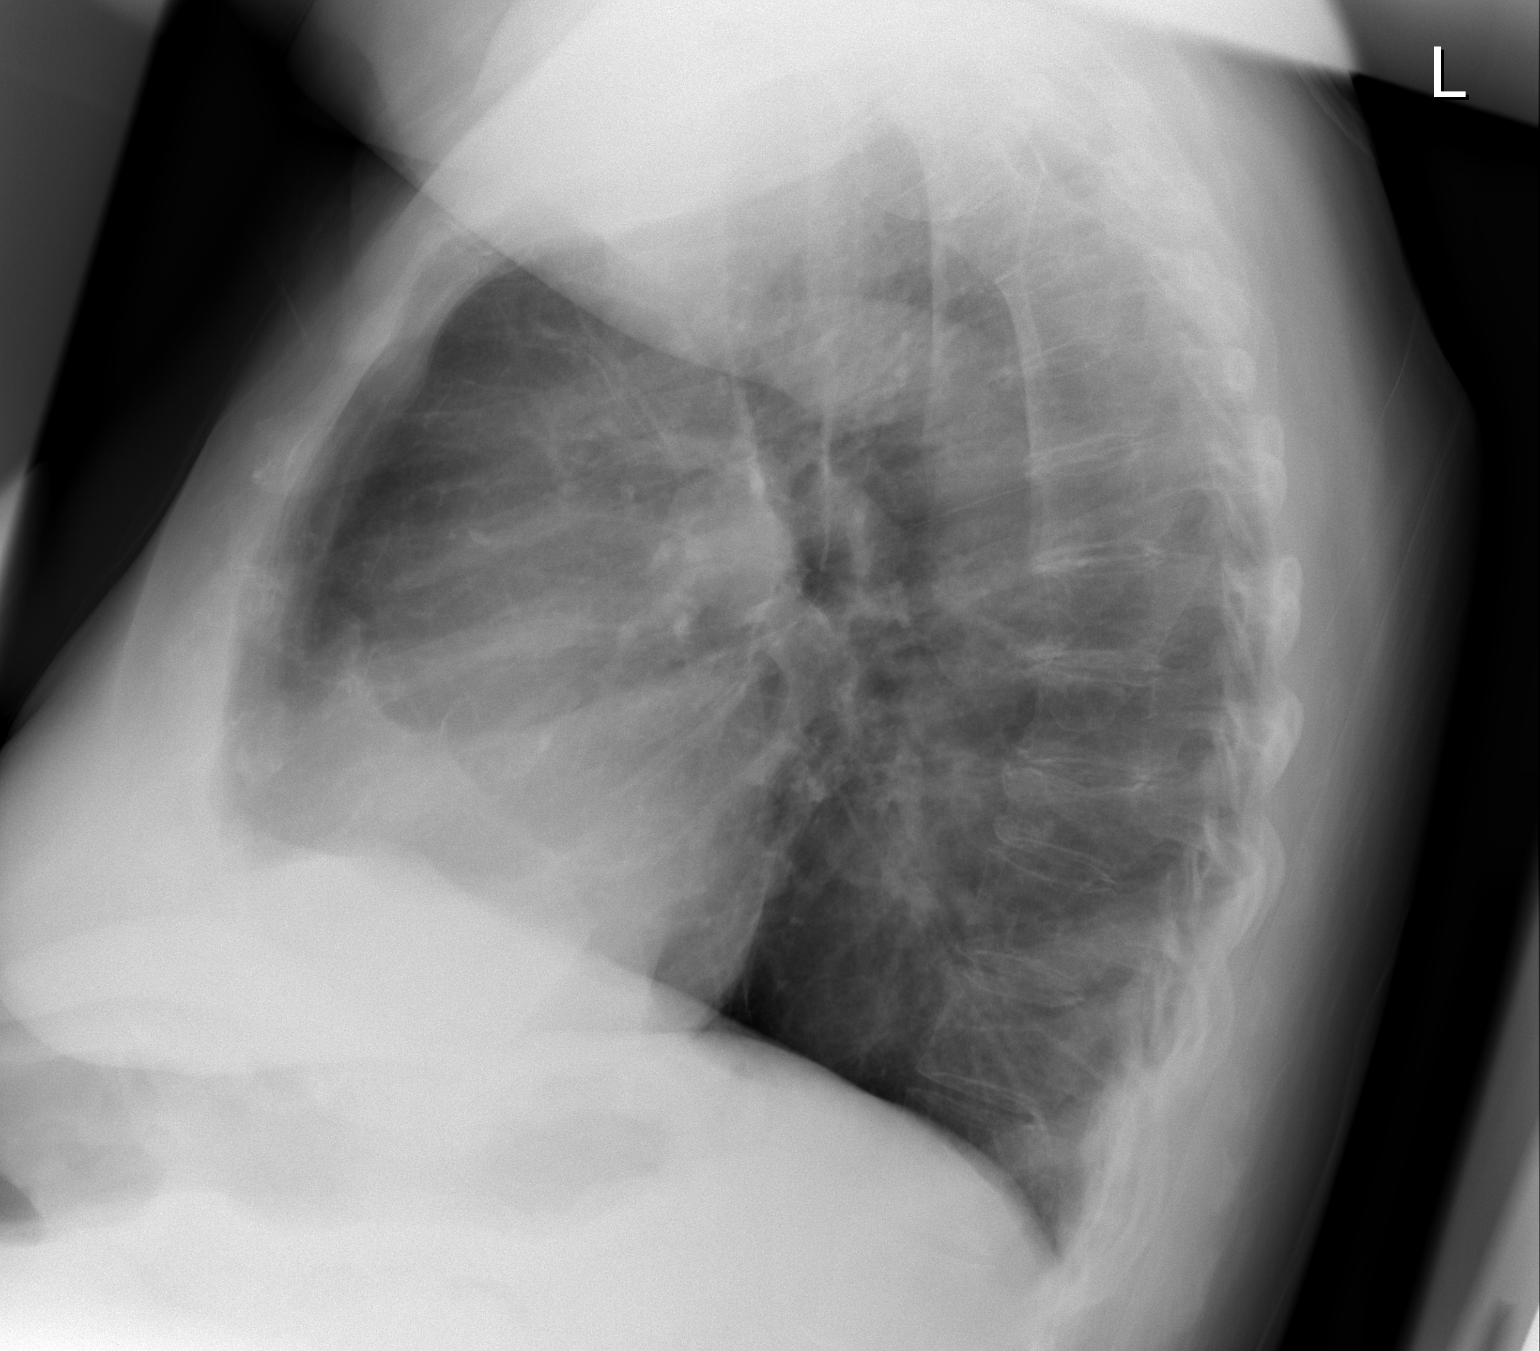

[view not recorded]
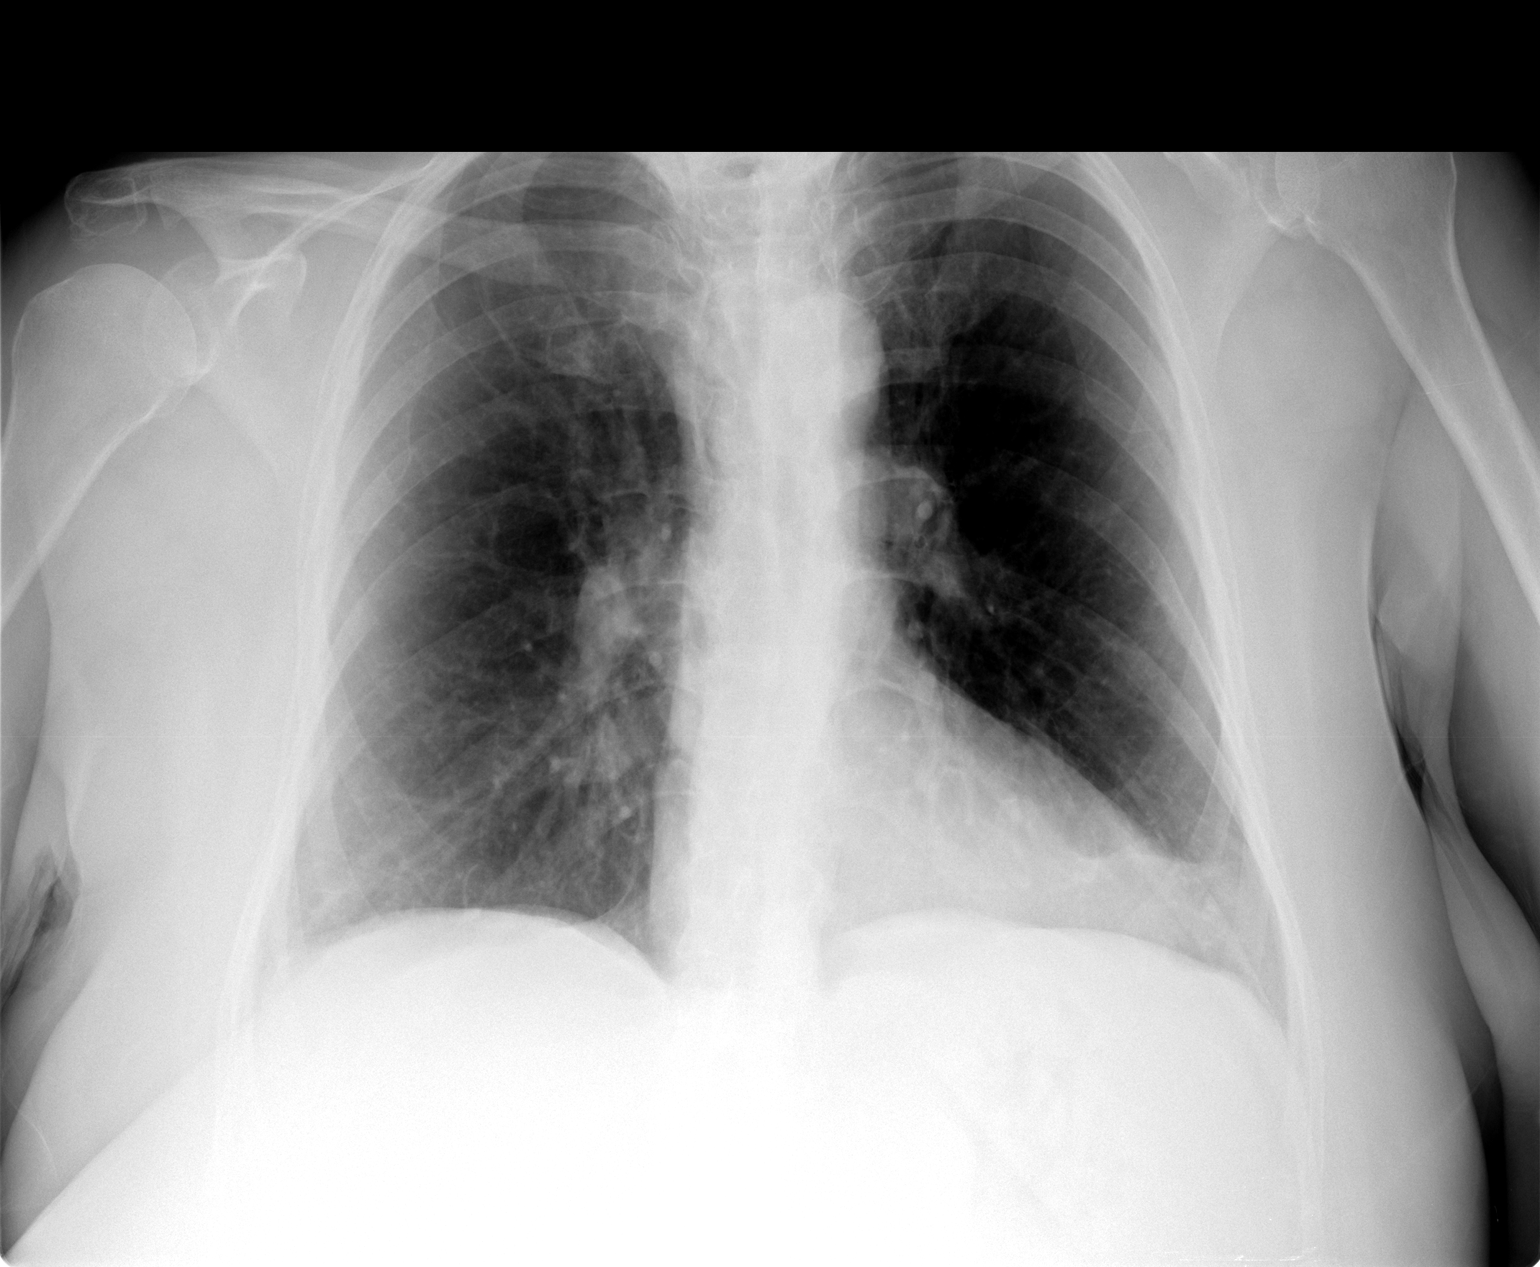

[2 of 2 positions shown; findings below may reference images not displayed]

FINDINGS: Emphysematous changes are again noted.  Bibasilar atelectasis is noted.  Heart size is normal.
IMPRESSION: Bibasilar atelectasis.  COPD.

## 2006-09-07 ENCOUNTER — Emergency Department (HOSPITAL_COMMUNITY): Admission: EM | Admit: 2006-09-07 | Discharge: 2006-09-07 | Payer: Self-pay | Admitting: Podiatry

## 2006-09-08 IMAGING — CR DG CHEST 2V
2 series · 2 of 2 positions shown · non-contrast
Comparison: 08/04/06.

CLINICAL DATA: Back pain and cough.
 CHEST ? 2 VIEW:

[w chest lat]
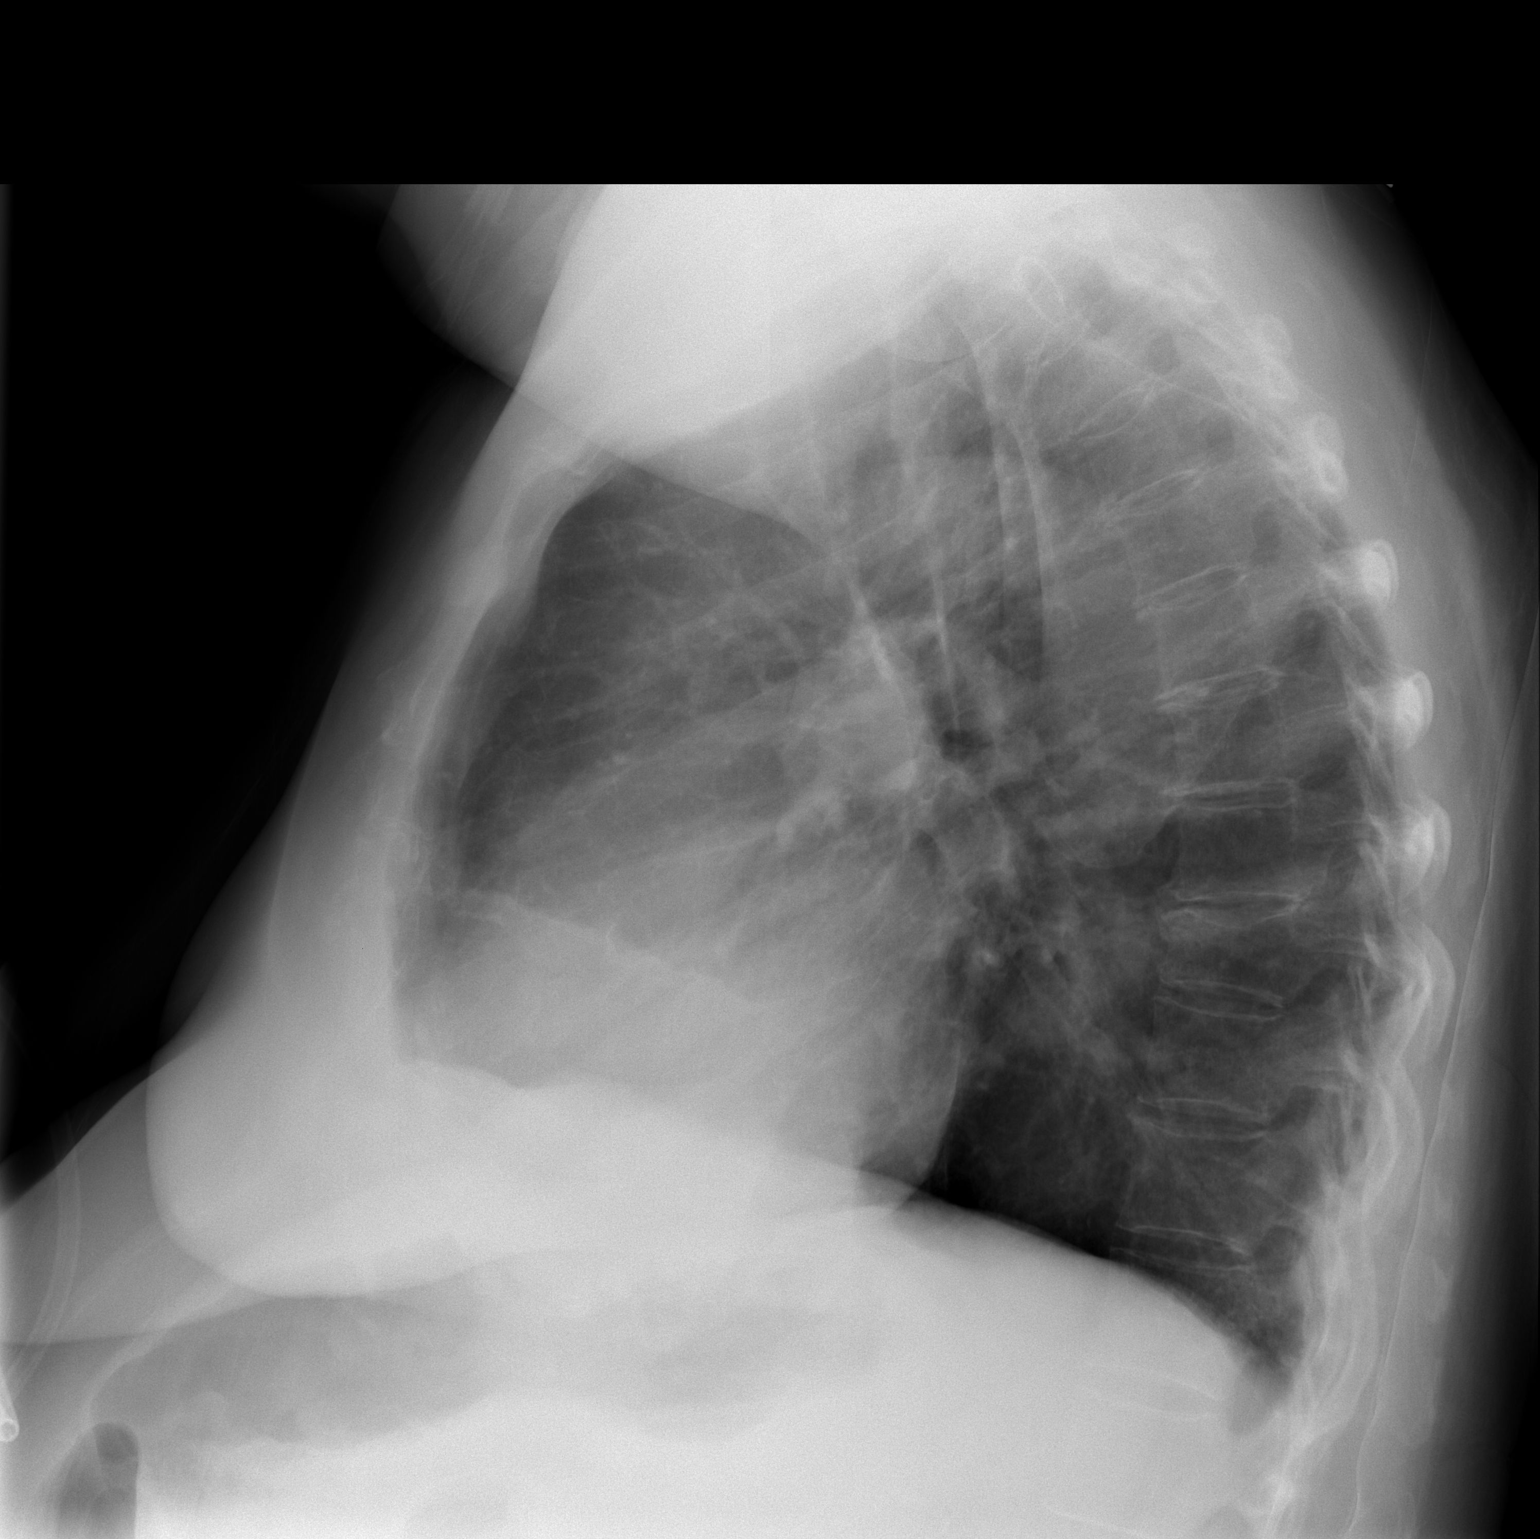

[w chest pa]
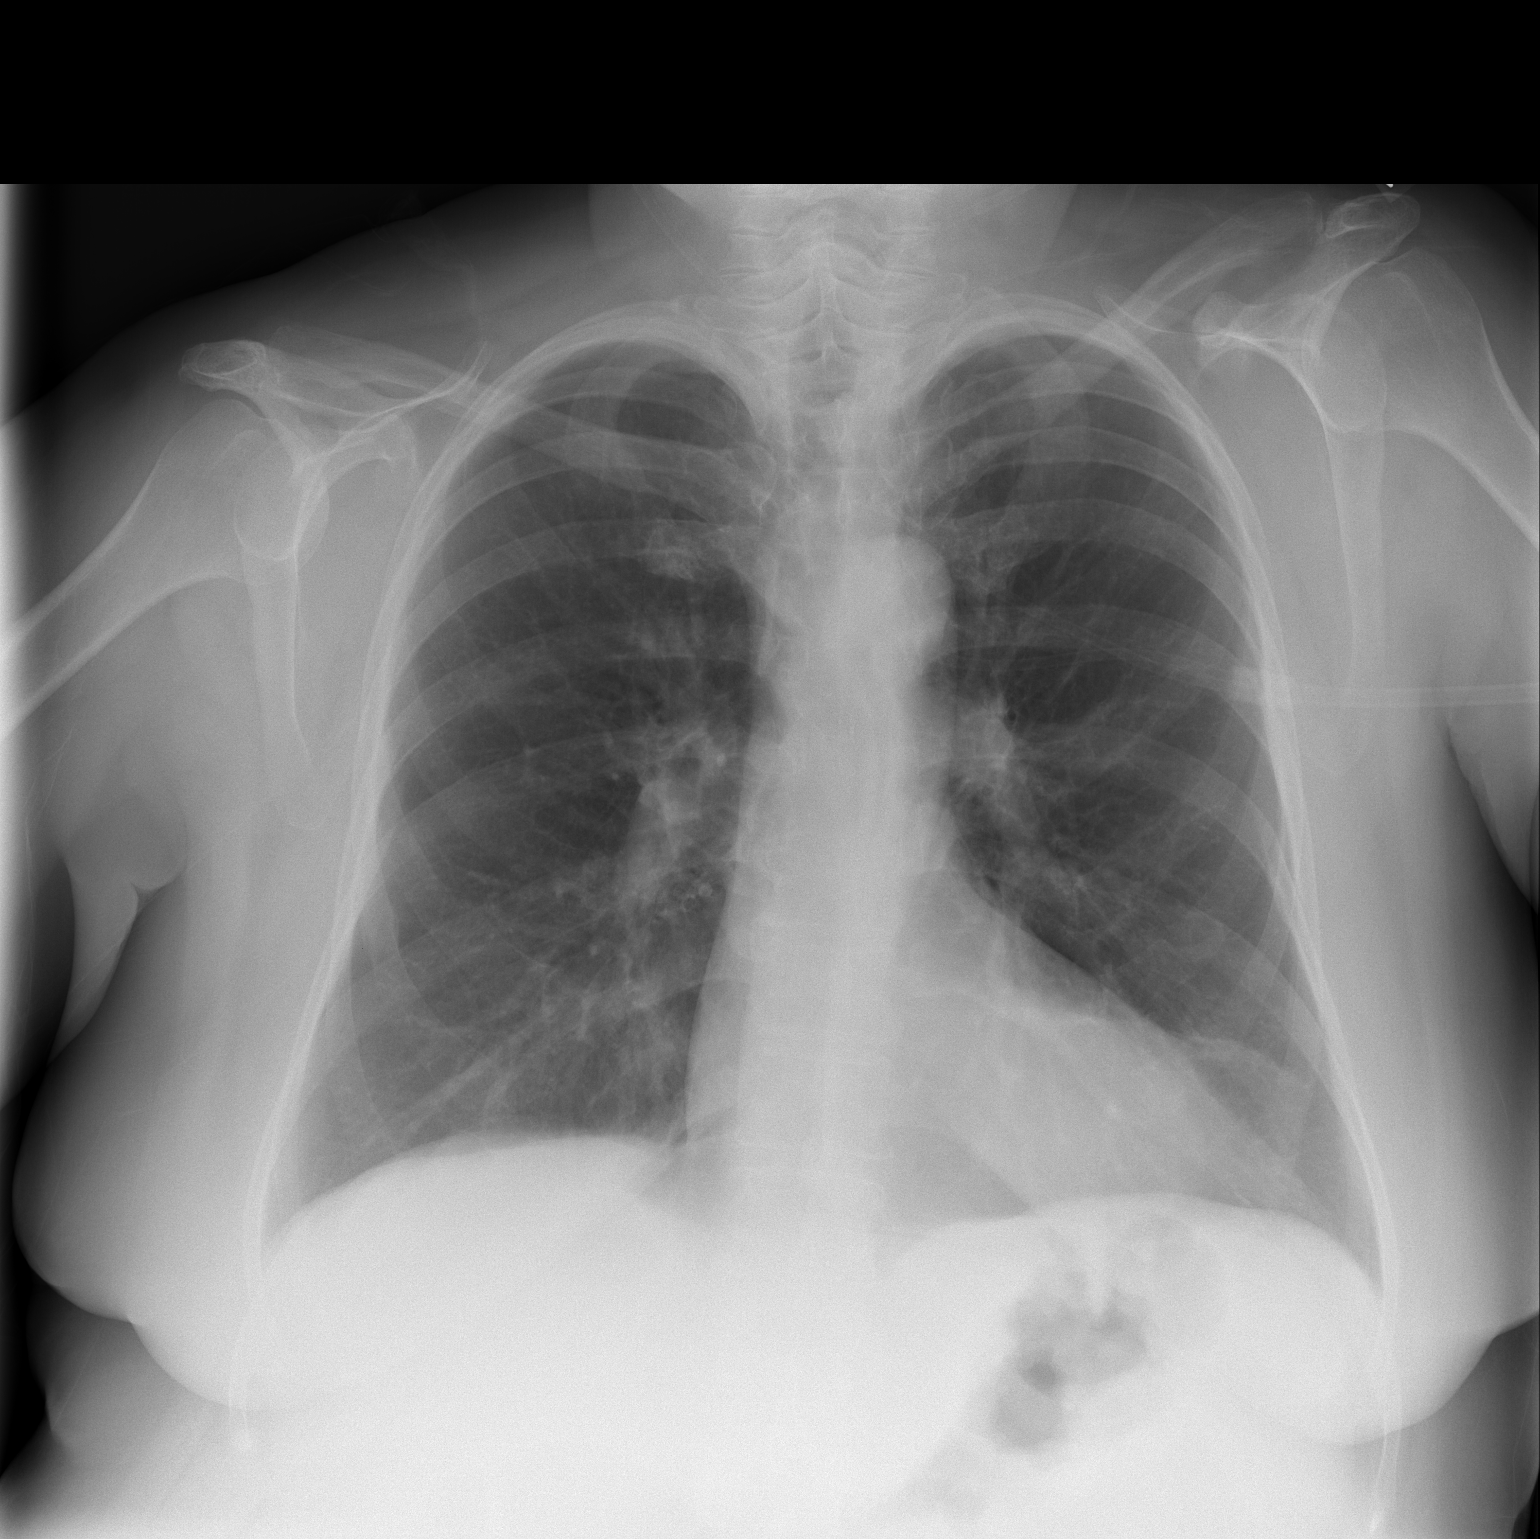

[2 of 2 positions shown; findings below may reference images not displayed]

FINDINGS: Lateral view demonstrates mild thoracic spine compression deformity to similar on the prior exam.  This is likely due to osteopenia.  There is likely a subacute fracture of the anterior right sixth rib.  The trachea is midline.  The heart is within normal limits.  Mediastinal contours are normal.  Costophrenic angles are sharp.  Minimal scar atelectasis involves the left lung base.  COPD.
IMPRESSION: 1. Subacute right anterior sixth rib fracture.  
 2. COPD but no evidence of acute cardiopulmonary disease. 
 3. Mild mid thoracic compression deformity, likely due to mild osteopenia.

## 2006-09-11 ENCOUNTER — Emergency Department (HOSPITAL_COMMUNITY): Admission: EM | Admit: 2006-09-11 | Discharge: 2006-09-11 | Payer: Self-pay | Admitting: Emergency Medicine

## 2006-09-11 ENCOUNTER — Encounter: Payer: Self-pay | Admitting: Vascular Surgery

## 2006-09-12 ENCOUNTER — Ambulatory Visit: Payer: Self-pay | Admitting: Family Medicine

## 2006-09-12 IMAGING — CR DG CHEST 2V
3 series · 3 of 3 positions shown · non-contrast
Comparison: 08/06/06.

CLINICAL DATA: Weakness, unable to walk.
 CHEST ? 2 VIEW:

[view not recorded (1 of 3)]
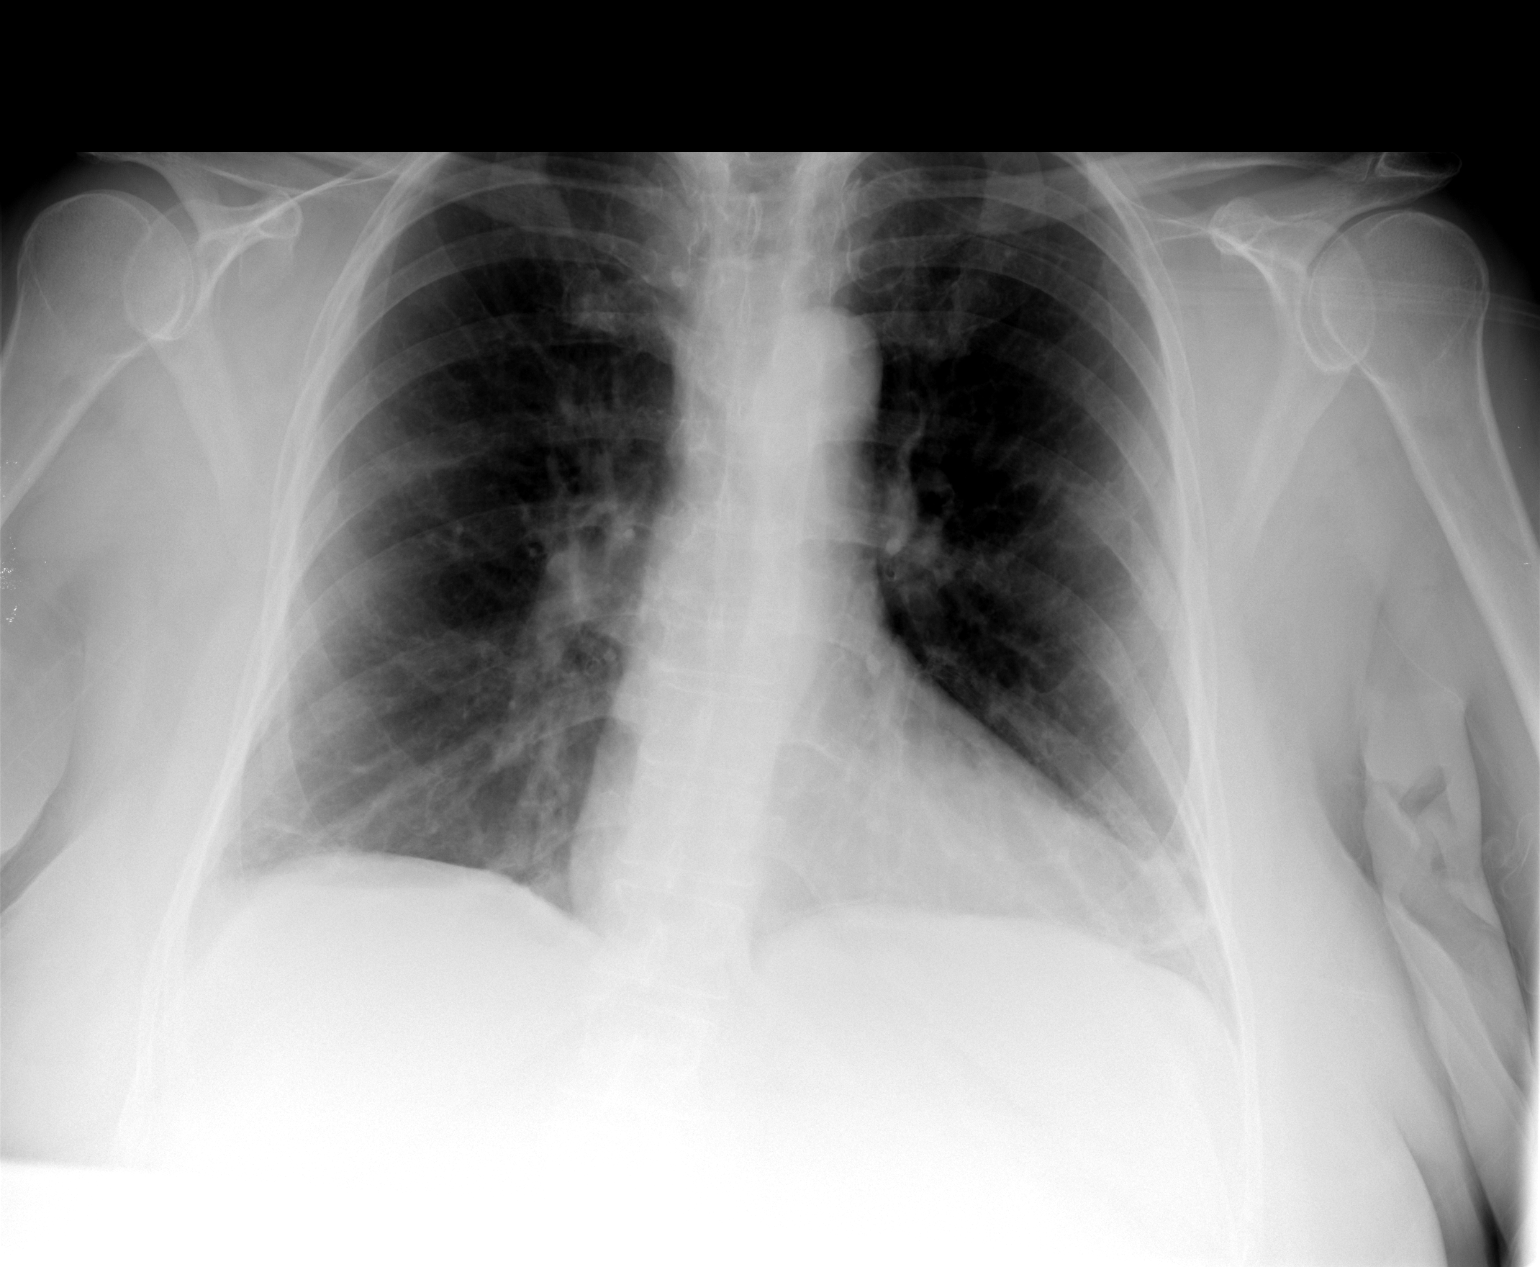

[view not recorded (2 of 3)]
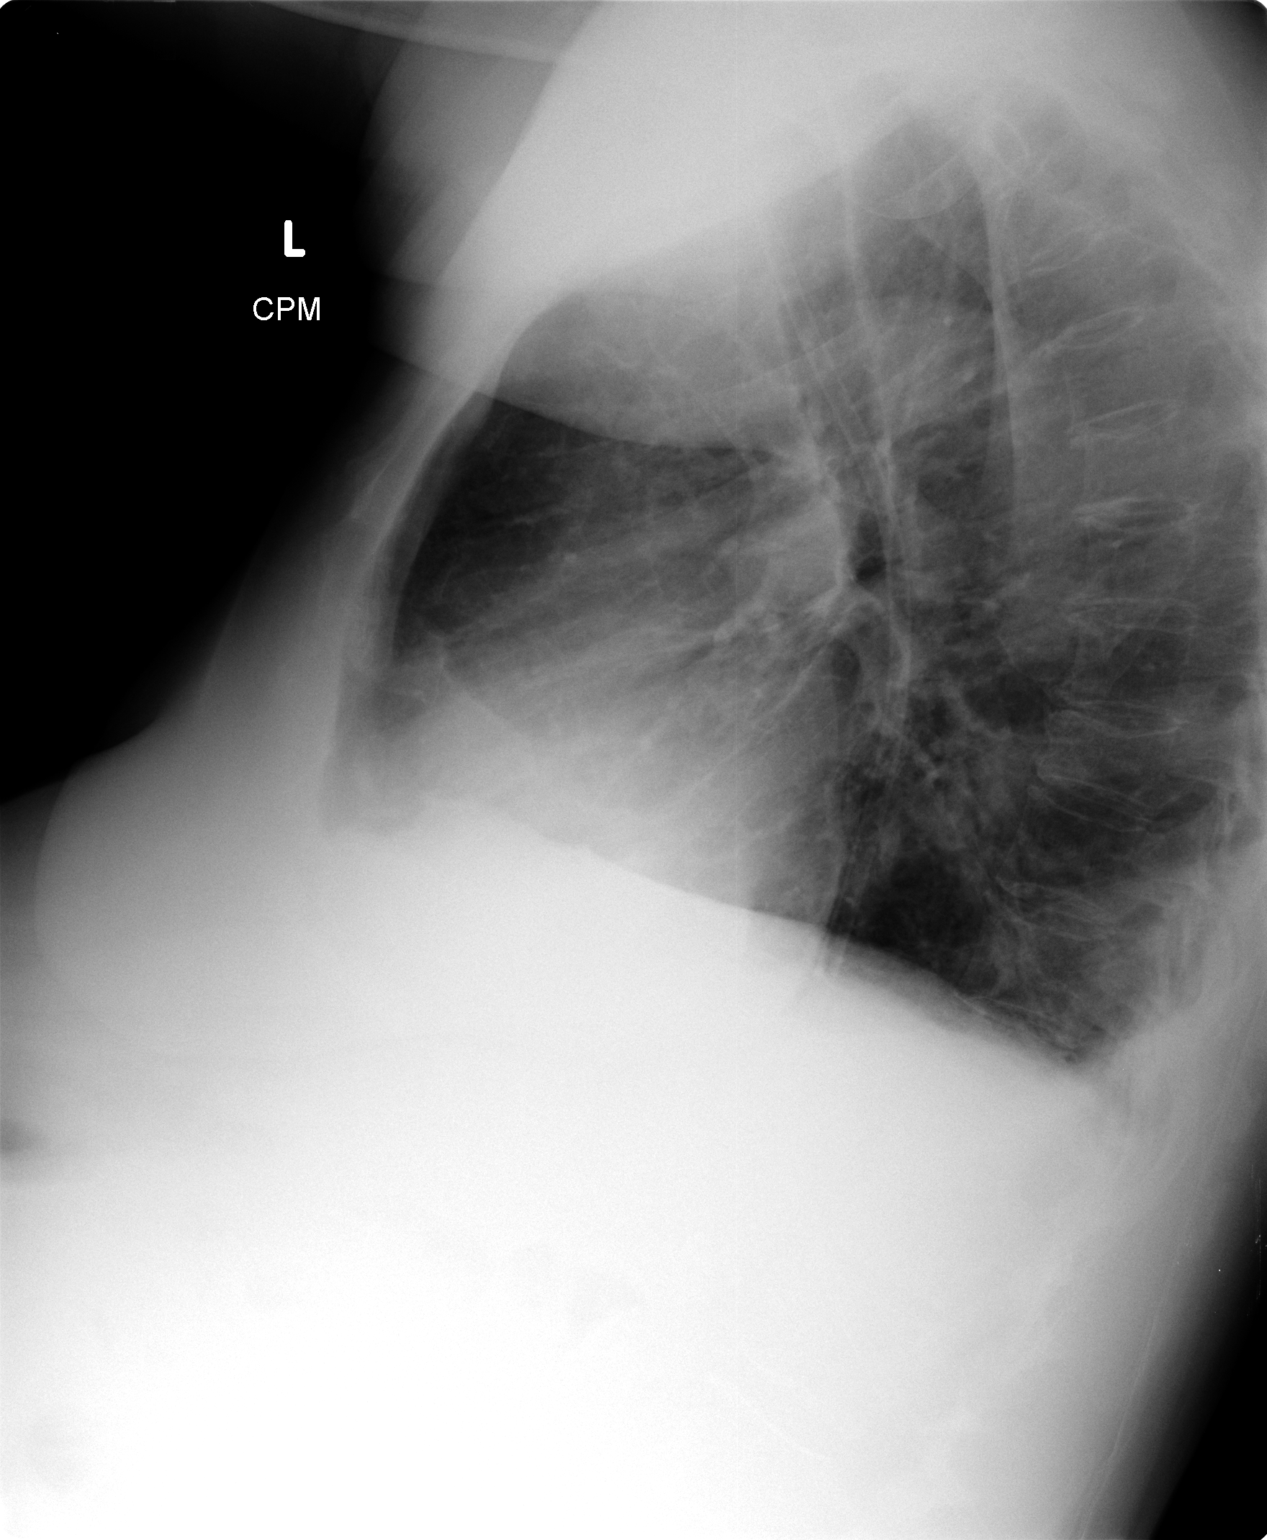

[view not recorded (3 of 3)]
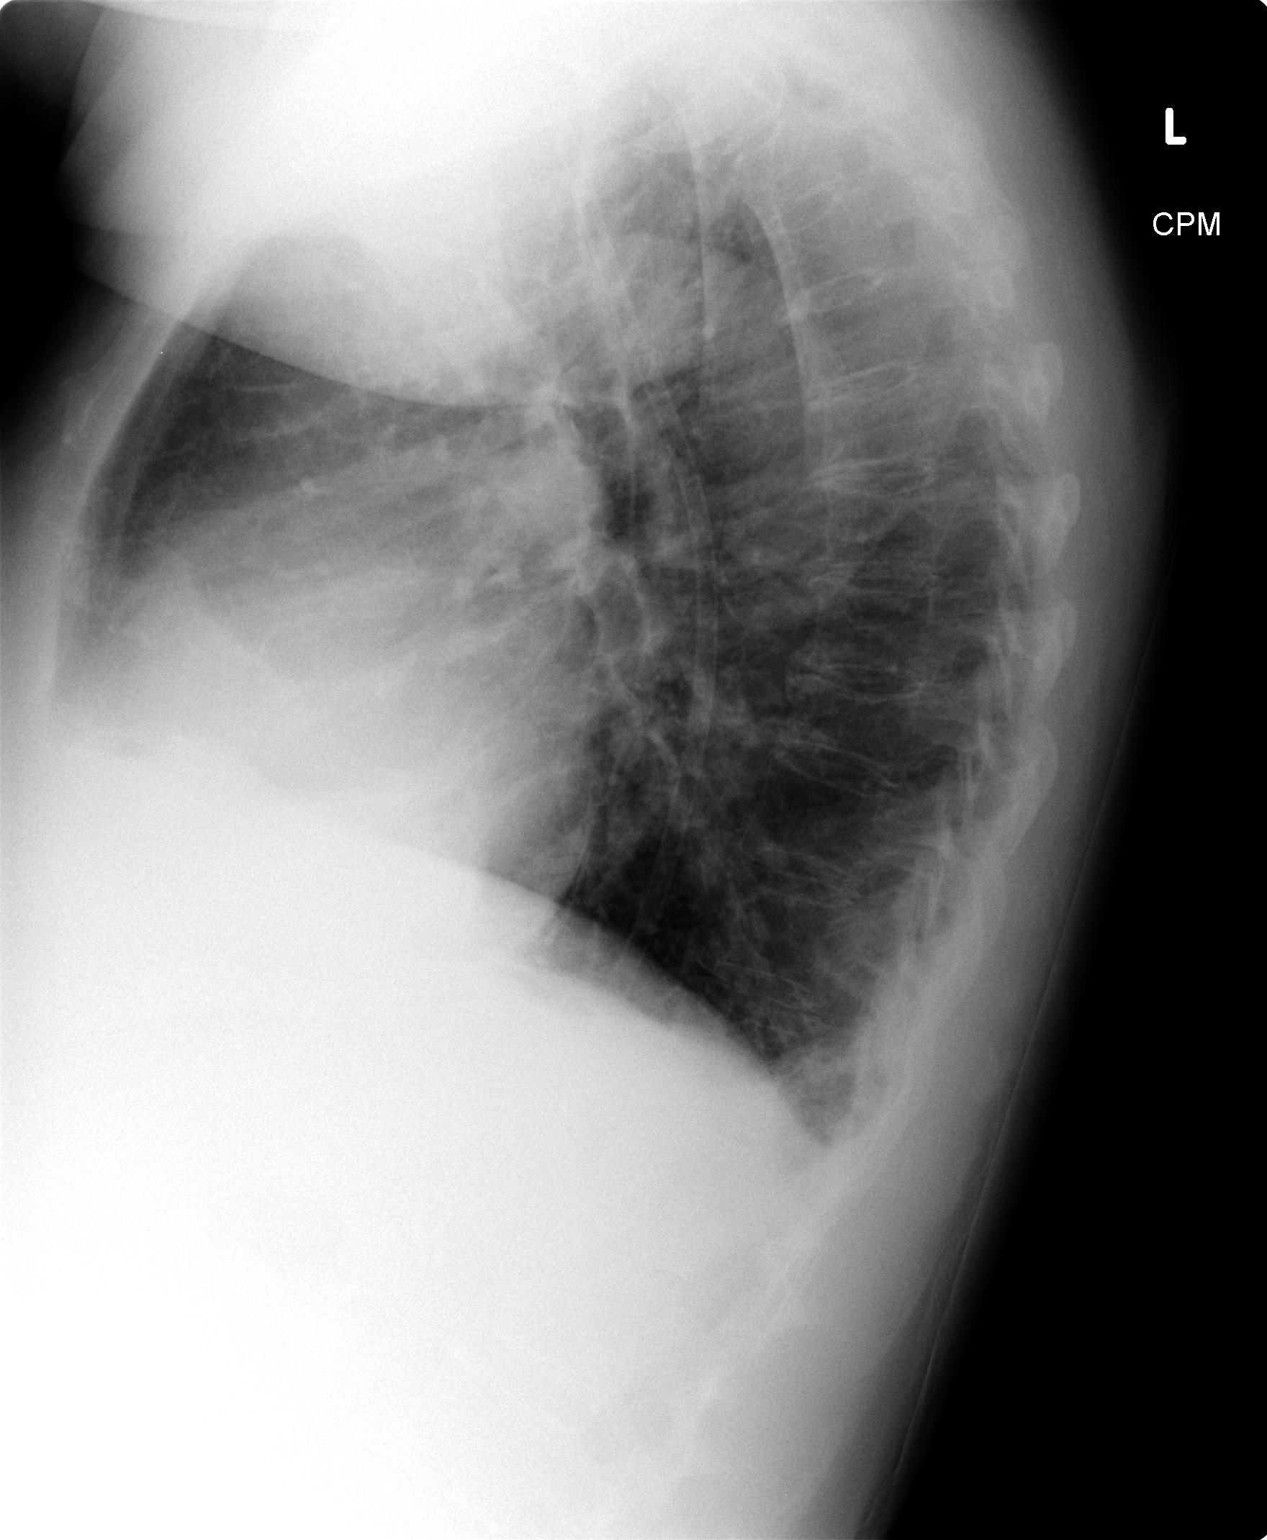

[3 of 3 positions shown; findings below may reference images not displayed]

FINDINGS: Two views of the chest show cardiomegaly with small effusions.  No focal infiltrate is seen.  No acute bony abnormality is noted.
IMPRESSION: Cardiomegaly and small pleural effusions.

## 2006-09-13 ENCOUNTER — Encounter: Admission: RE | Admit: 2006-09-13 | Discharge: 2006-09-13 | Payer: Self-pay | Admitting: Family Medicine

## 2006-09-14 ENCOUNTER — Emergency Department (HOSPITAL_COMMUNITY): Admission: EM | Admit: 2006-09-14 | Discharge: 2006-09-14 | Payer: Self-pay | Admitting: Emergency Medicine

## 2006-09-17 ENCOUNTER — Emergency Department (HOSPITAL_COMMUNITY): Admission: EM | Admit: 2006-09-17 | Discharge: 2006-09-17 | Payer: Self-pay | Admitting: Emergency Medicine

## 2006-09-18 IMAGING — CR DG CHEST 2V
2 series · 2 of 2 positions shown · non-contrast
Comparison: 08/10/06.

CLINICAL DATA: 64-year-old with weakness and shortness of breath.  
 CHEST - 2 VIEW:

[w chest pa]
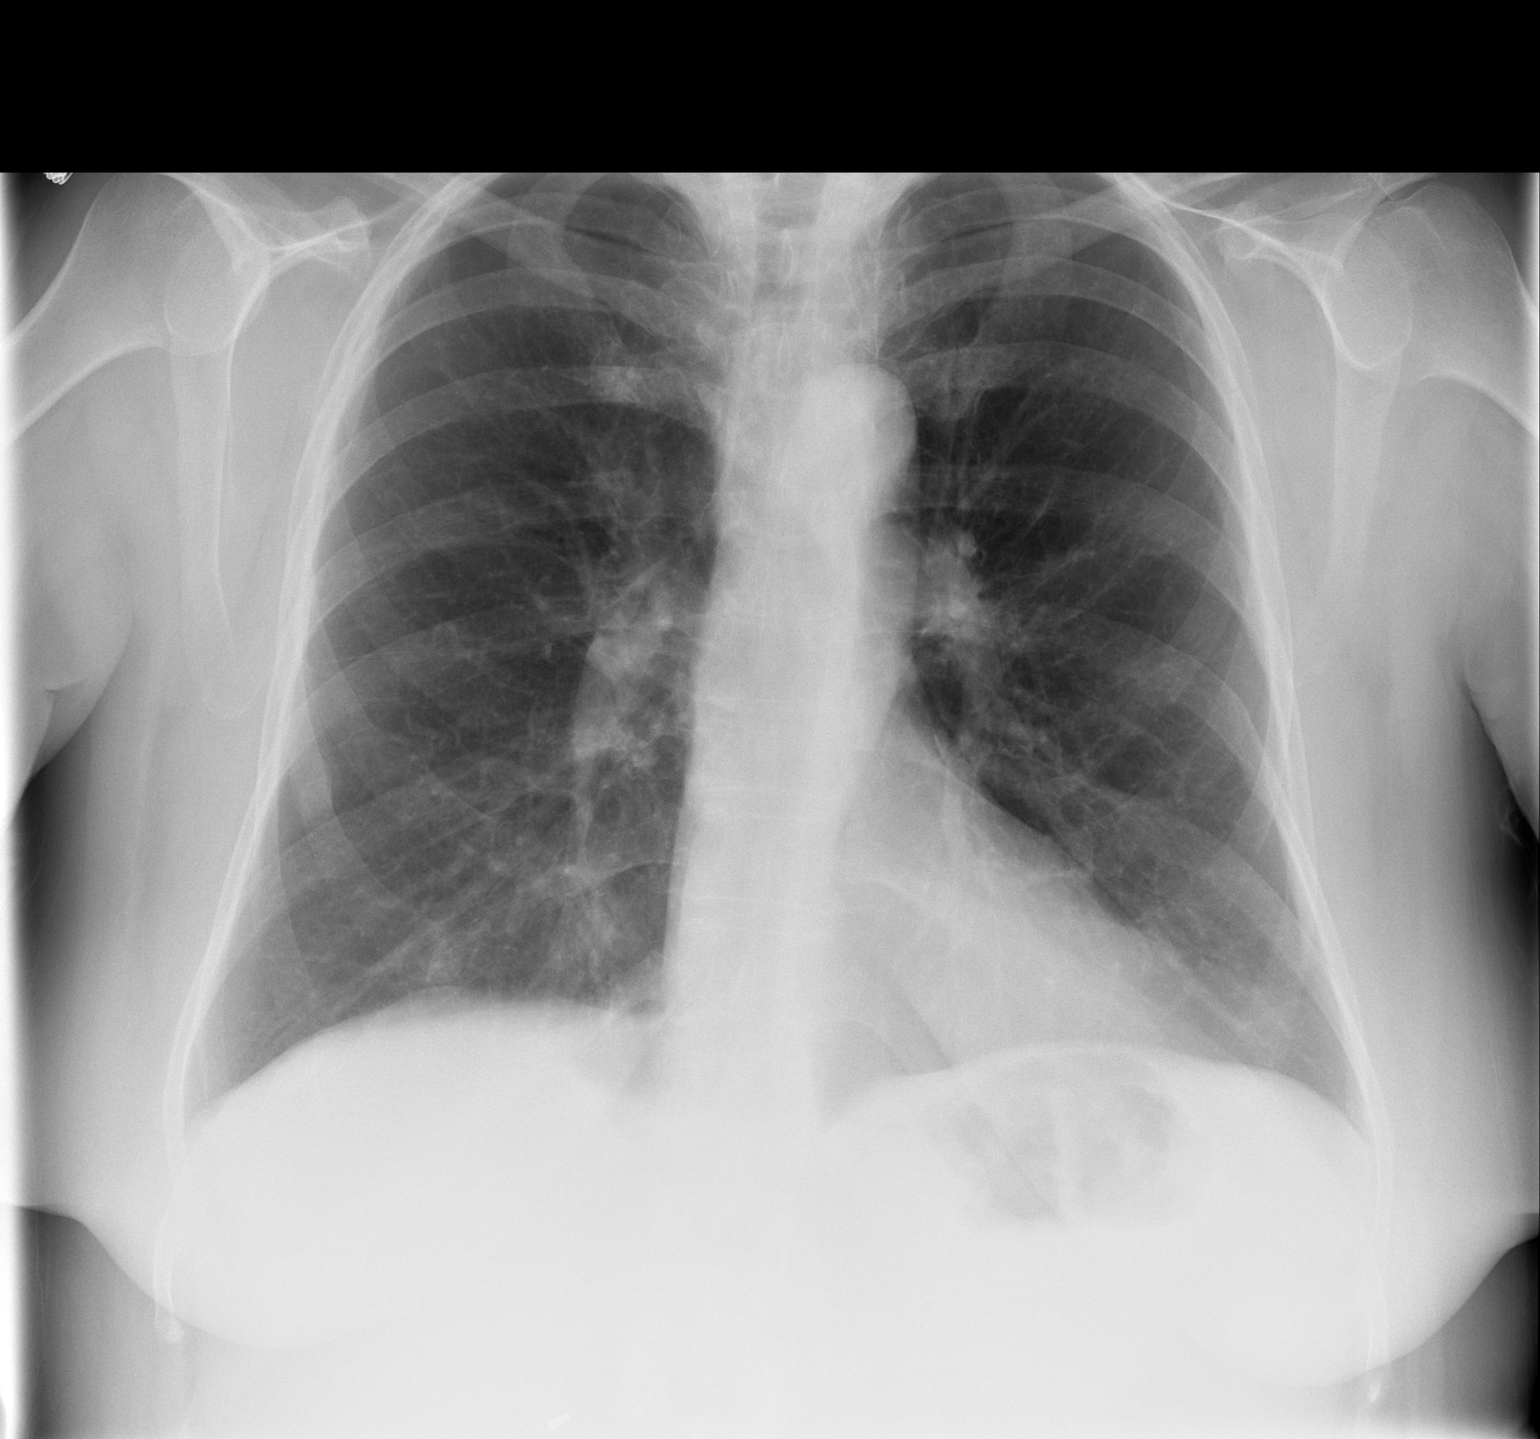

[w chest lat]
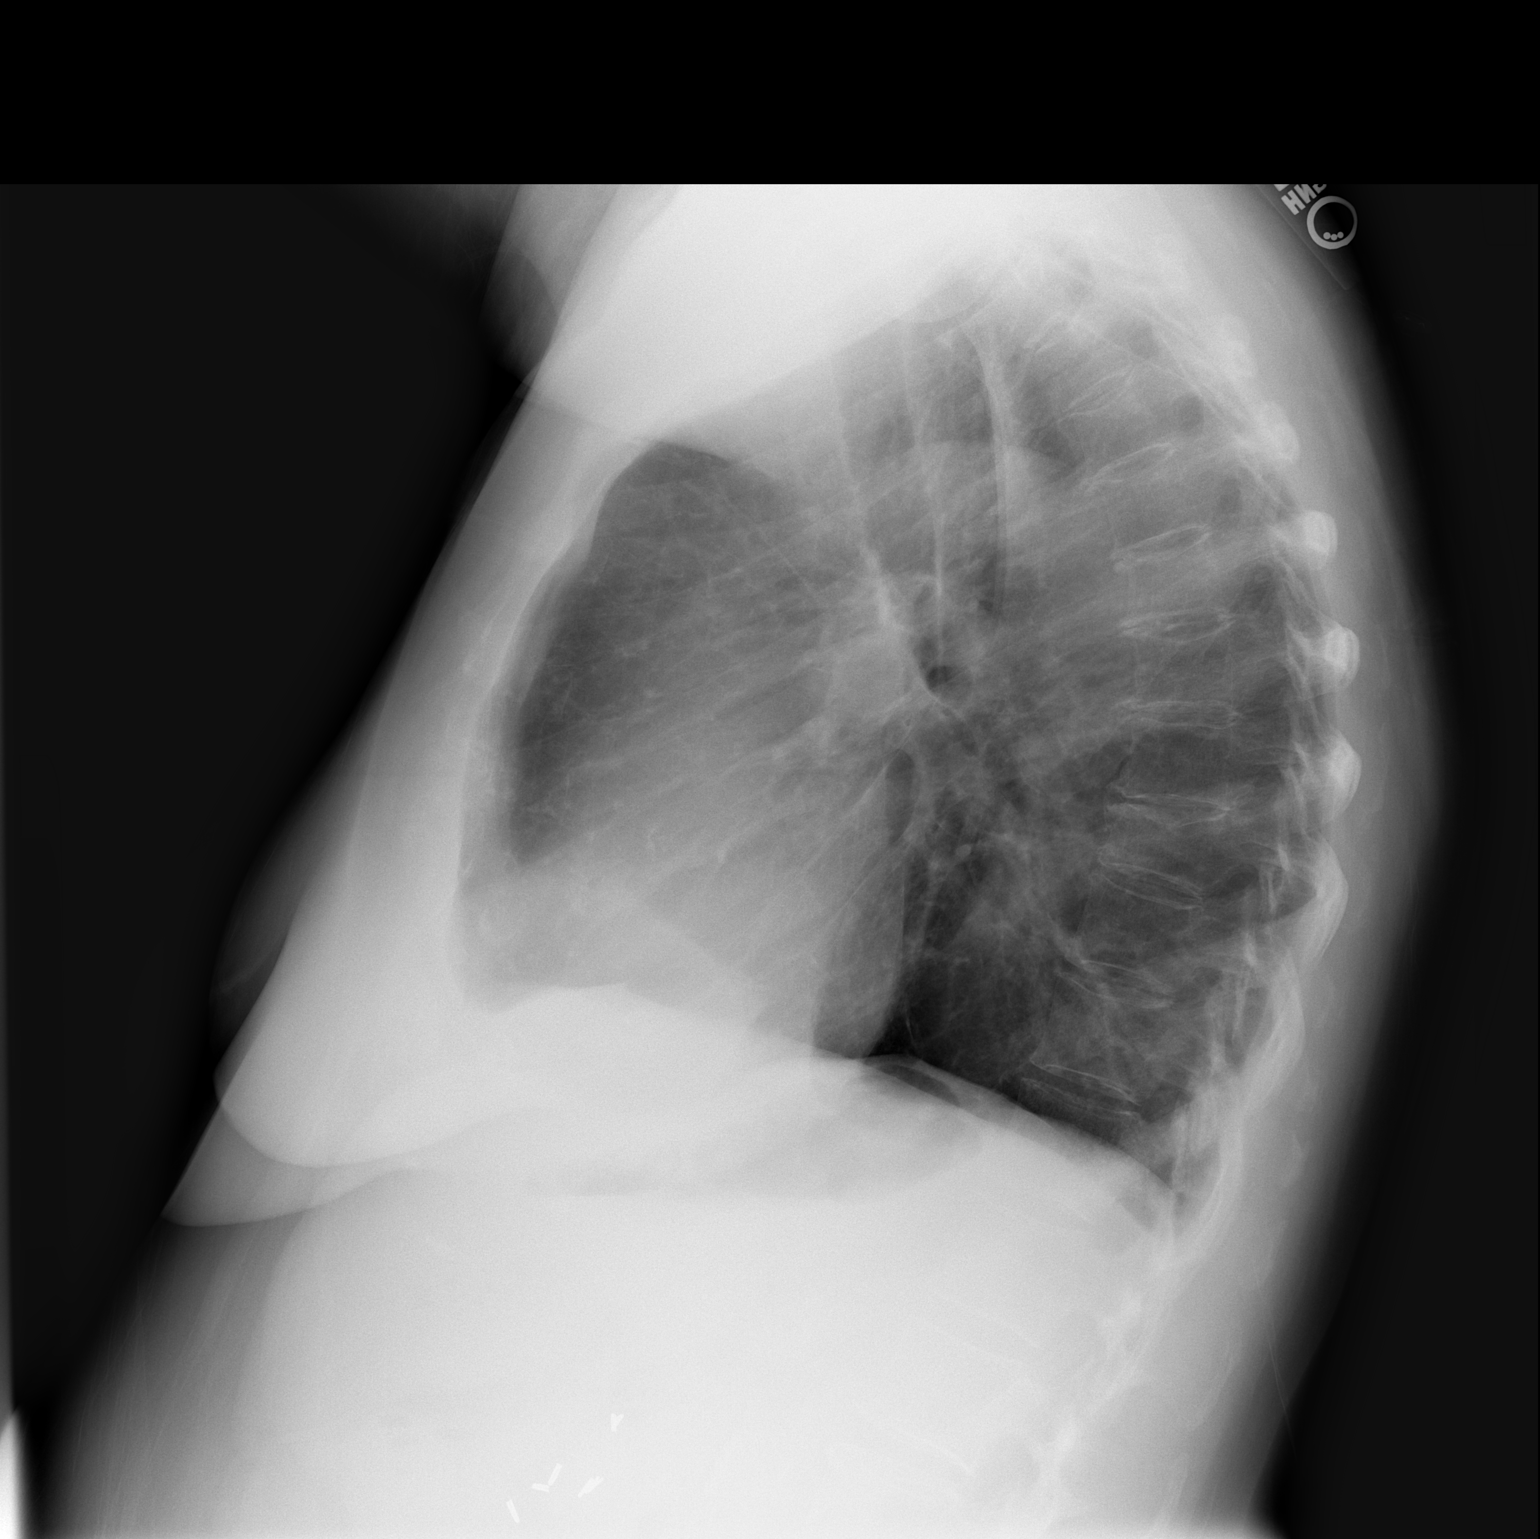

[2 of 2 positions shown; findings below may reference images not displayed]

FINDINGS: Cardiac silhouette, mediastinal and hilar contours are within normal limits and stable.  Minimal streaky bibasilar atelectasis.  No effusions or edema.  Bony structures are stable.  Mild compression deformity of T9.
IMPRESSION: No acute cardiopulmonary findings.  Minimal stable bibasilar atelectasis.

## 2006-09-24 ENCOUNTER — Emergency Department (HOSPITAL_COMMUNITY): Admission: EM | Admit: 2006-09-24 | Discharge: 2006-09-24 | Payer: Self-pay | Admitting: Emergency Medicine

## 2006-09-29 ENCOUNTER — Ambulatory Visit: Payer: Self-pay | Admitting: Family Medicine

## 2006-10-06 ENCOUNTER — Ambulatory Visit: Payer: Self-pay | Admitting: Family Medicine

## 2006-10-12 ENCOUNTER — Ambulatory Visit: Payer: Self-pay

## 2006-10-14 IMAGING — CR DG CHEST 2V
1 series · 1 of 1 positions shown · non-contrast
Comparison: 08/16/2006.  
 Mild scarring left lower lung zone. No active infiltrate. COPD.  No CHF.

CLINICAL DATA: Shortness of breath, history of COPD and asthma.  
 CHEST - 2 VIEW:

[w chest lat]
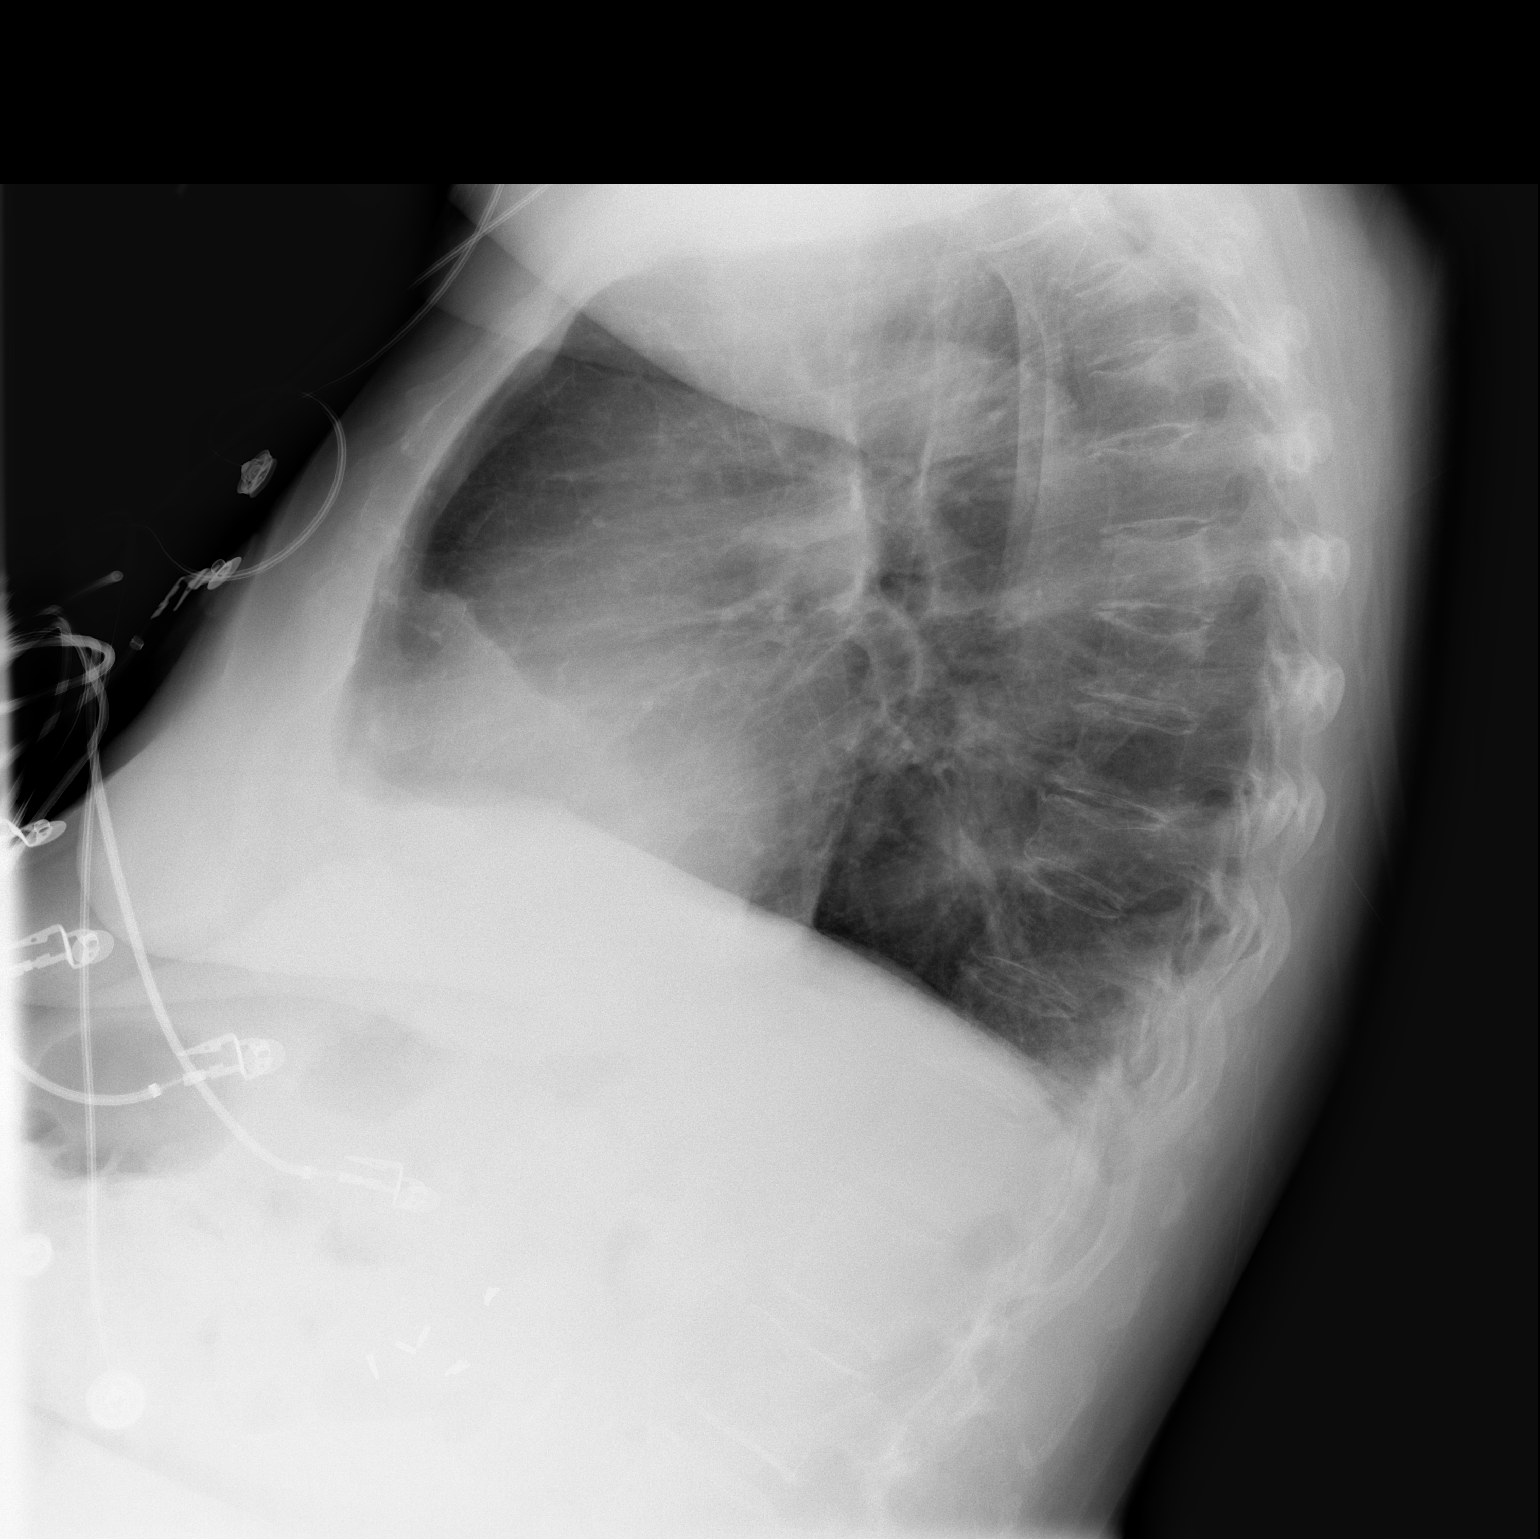

[1 of 1 positions shown; findings below may reference images not displayed]

IMPRESSION: COPD.  No acute chest findings.

## 2006-10-16 IMAGING — CR DG FOOT COMPLETE 3+V*R*
3 series · 3 of 3 positions shown · non-contrast
Comparison: none

CLINICAL DATA: Two week history of right foot pain.  The patient has cellulitis and is currently taking antibiotics.  Diabetes.  Redness and swelling. 
 RIGHT FOOT ? 3 VIEW:

[view not recorded (1 of 3)]
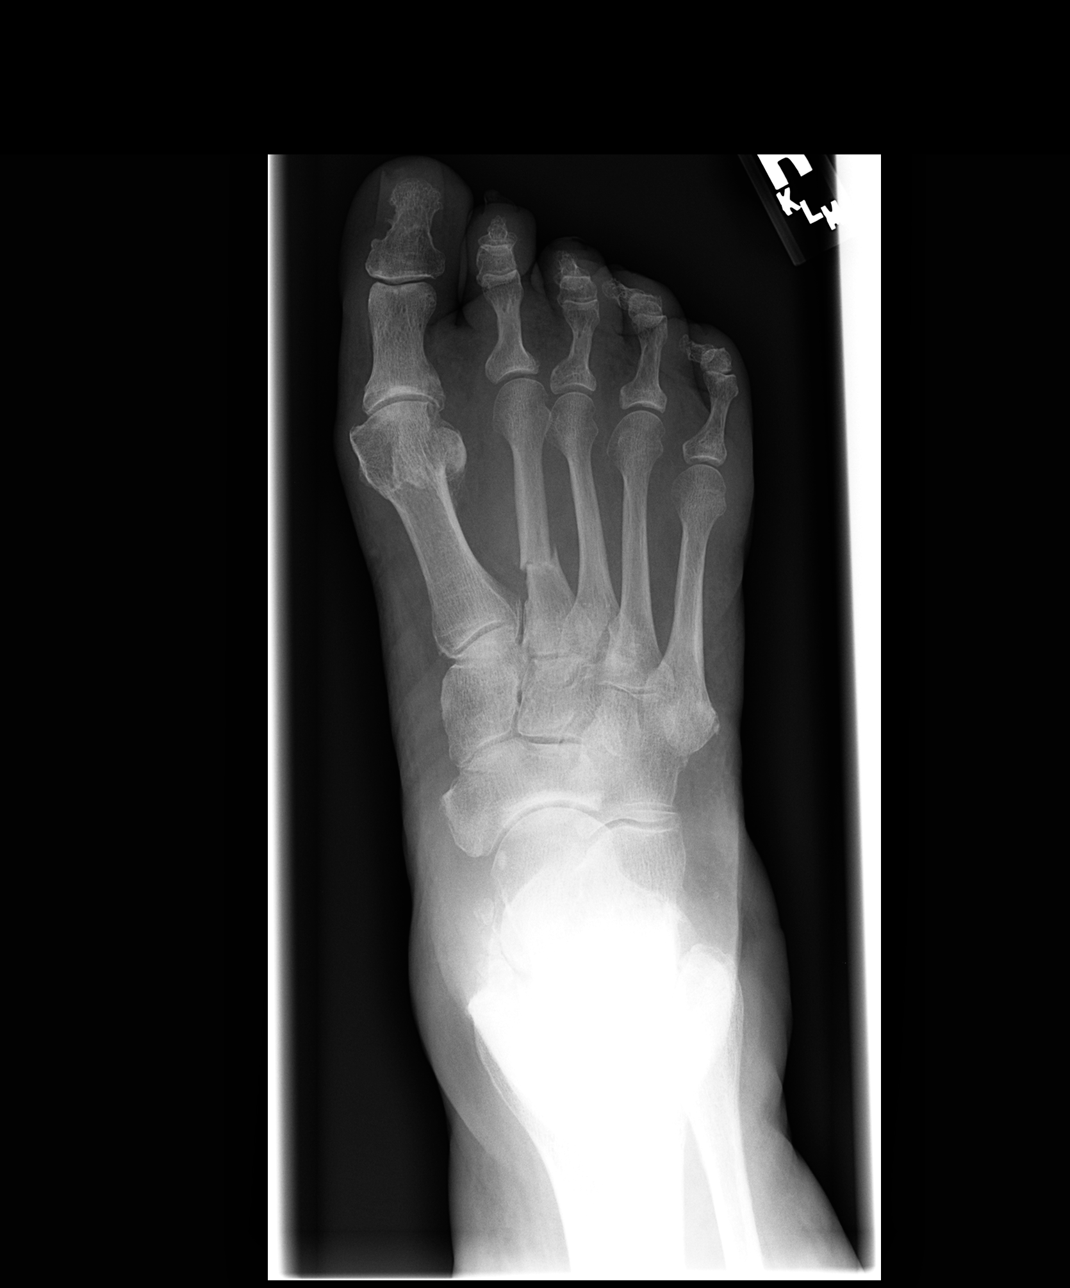

[view not recorded (2 of 3)]
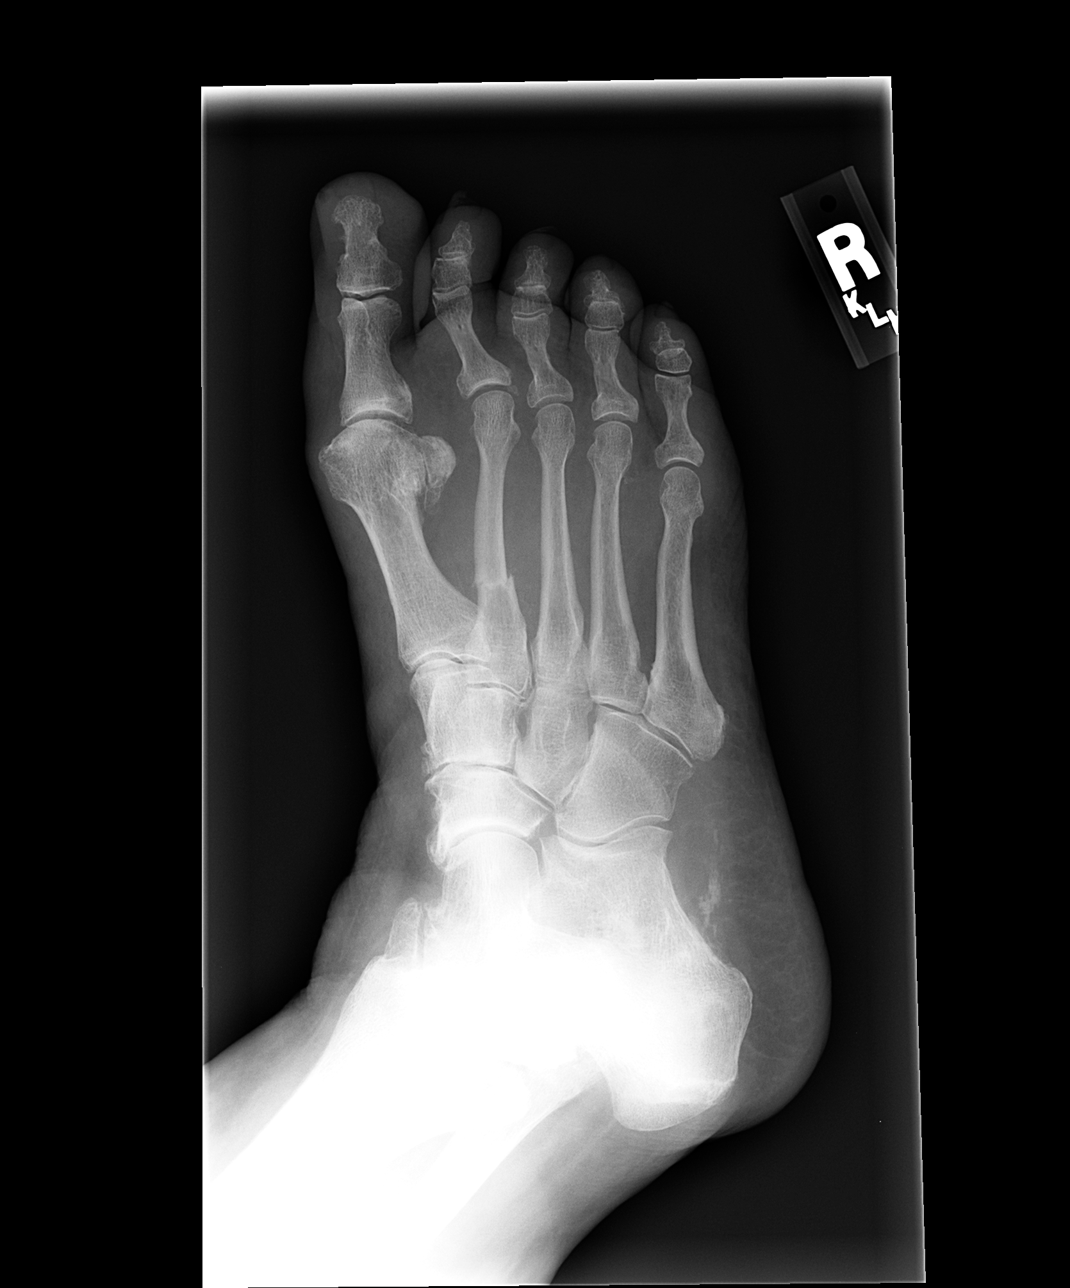

[view not recorded (3 of 3)]
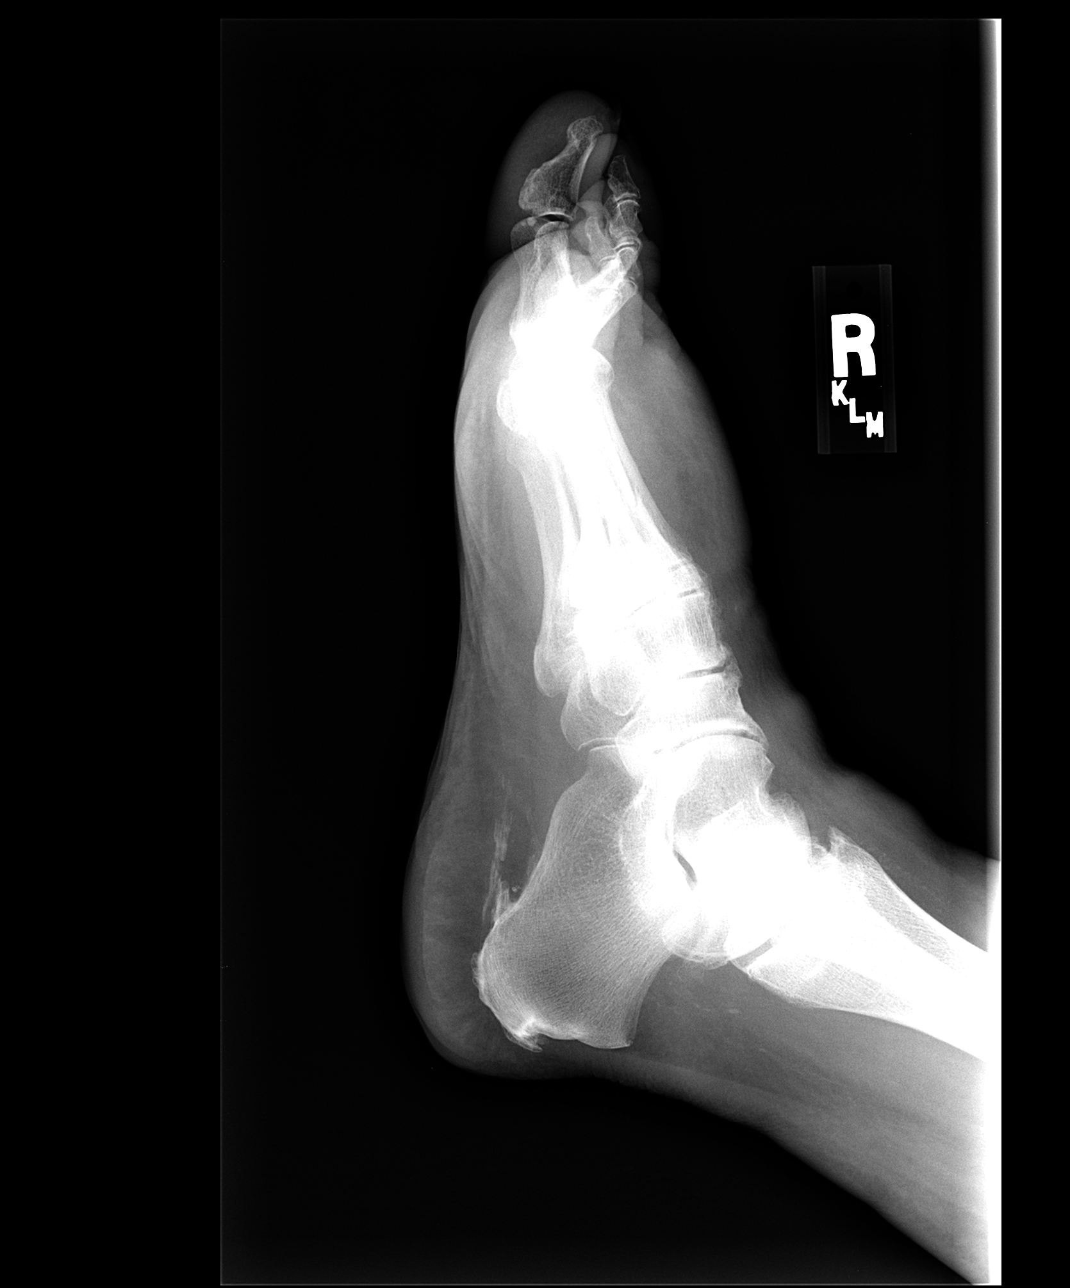

[3 of 3 positions shown; findings below may reference images not displayed]

FINDINGS: There is a slightly displaced transverse fracture of the proximal shaft of the second metatarsal with extensive soft tissue swelling in that area.  The patient has moderate osteoarthritic changes of the first metatarsophalangeal joint with bunion formation on the head of the first metatarsal.  There is calcification in the plantar fascia with a prominent plantar calcaneal spur.  There are degenerative changes at the dorsal aspect of the tarsal joints.  There are degenerative changes of the ankle joint as well.
IMPRESSION: Acute or subacute fracture of the proximal shaft of the second metatarsal with adjacent soft tissue swelling.  Degenerative changes as described.

## 2006-10-20 IMAGING — CR DG CHEST 2V
2 series · 2 of 2 positions shown · non-contrast
Comparison: none

CLINICAL DATA: Nausea, vomiting and chest pain.  History of emphysema.  
 CHEST ? 2 VIEW:

[view not recorded (1 of 2)]
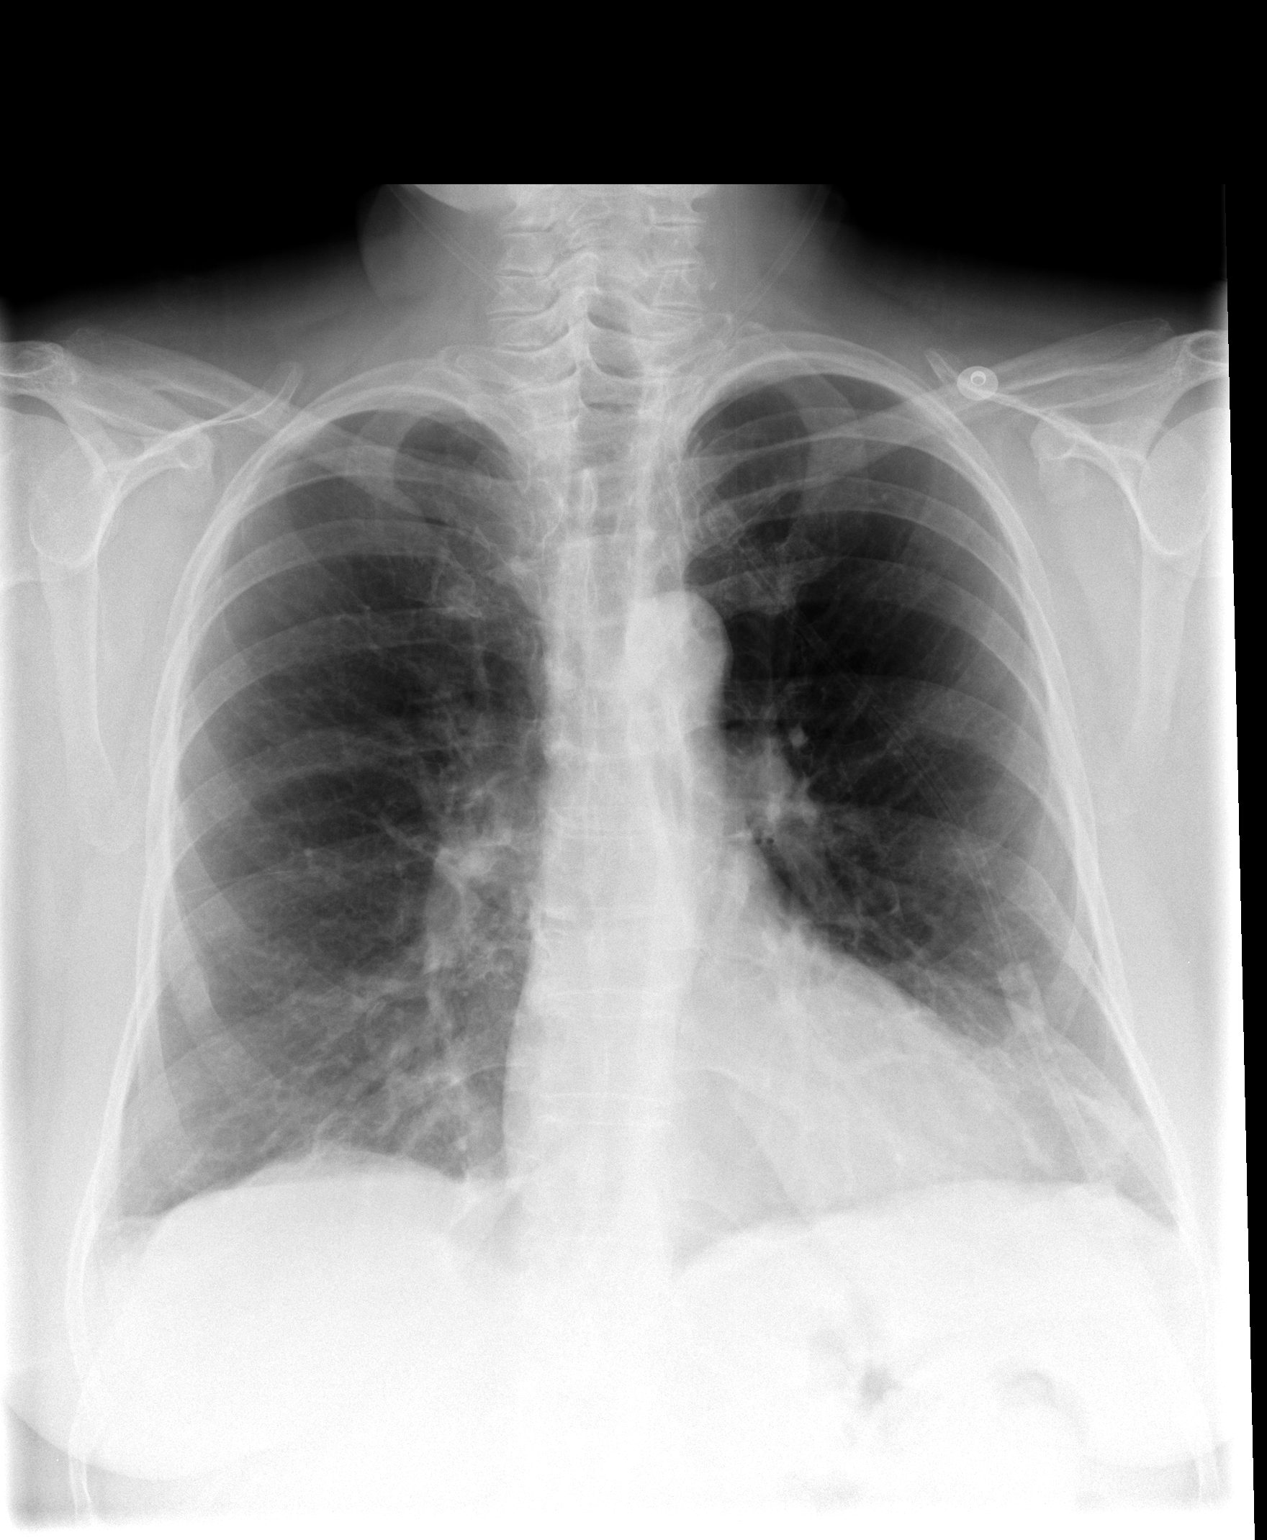

[view not recorded (2 of 2)]
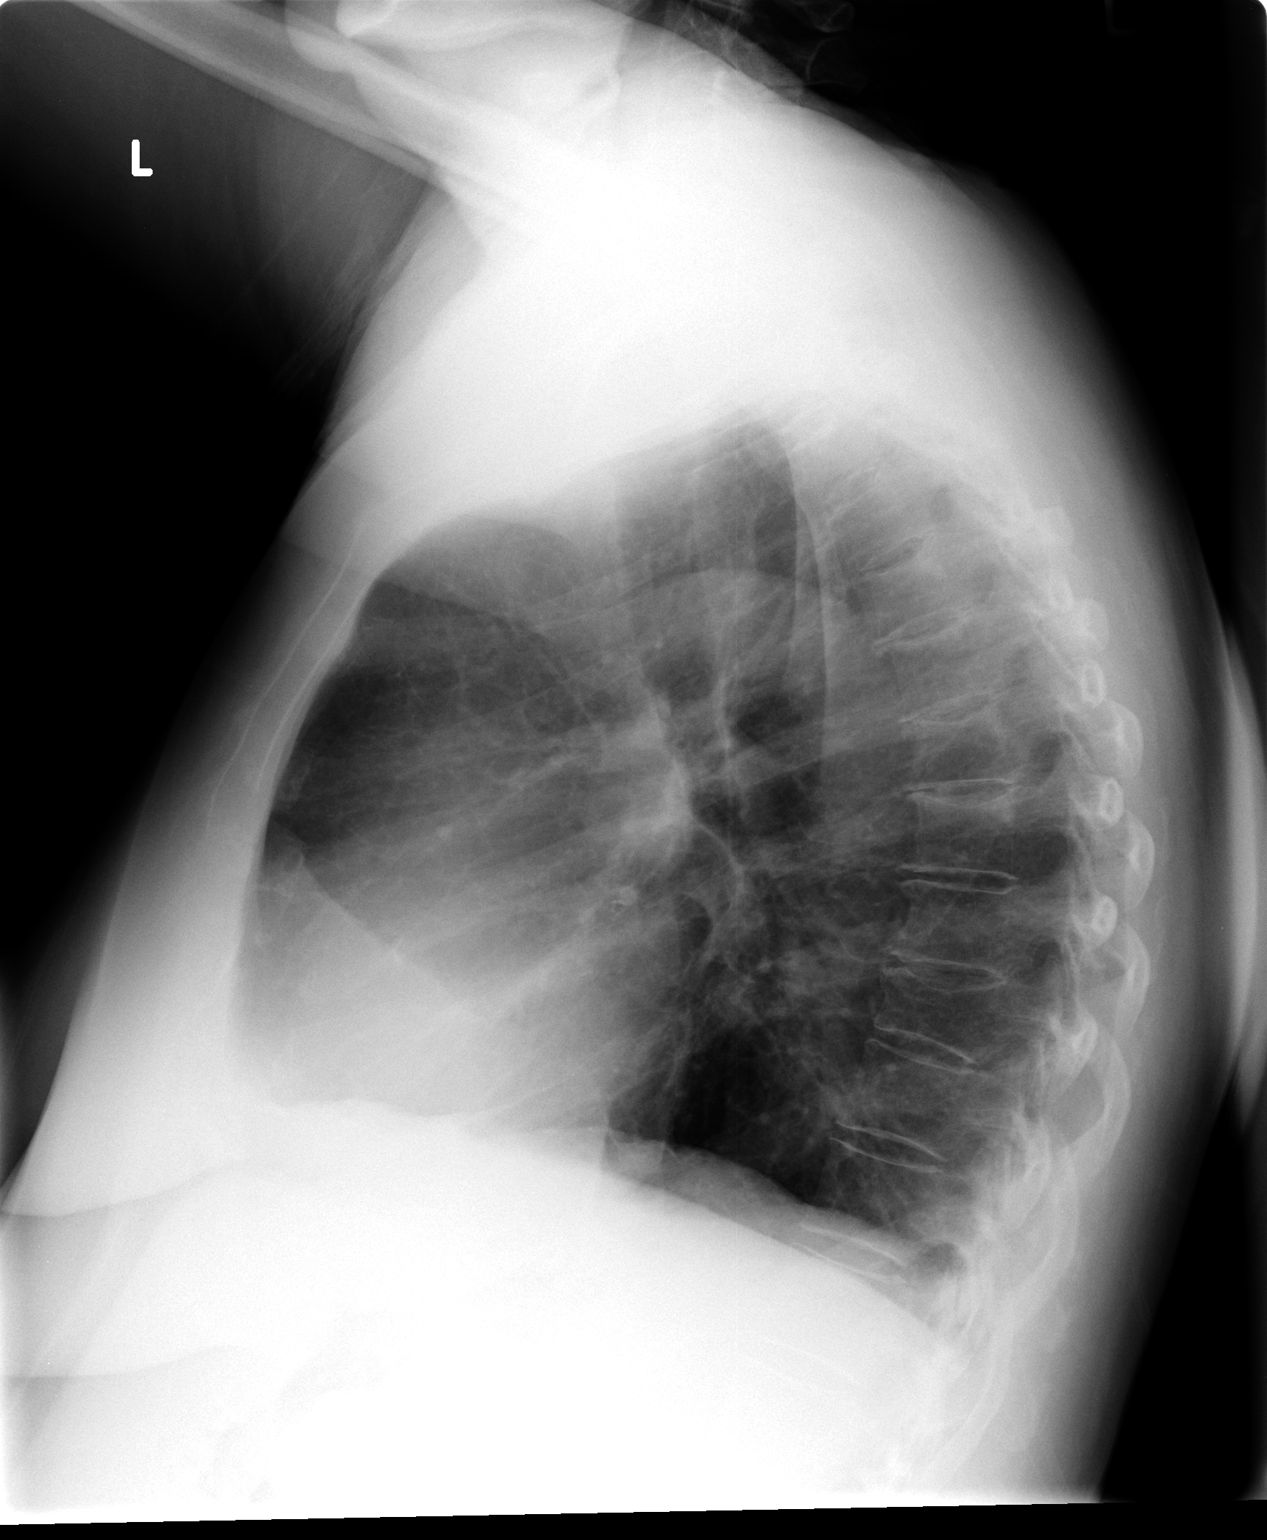

[2 of 2 positions shown; findings below may reference images not displayed]

FINDINGS: Chest is hyperexpanded.  There is a very small right pleural effusion.  No focal airspace disease.  Heart size is normal.
IMPRESSION: Emphysema with a very small right pleural effusion.

## 2006-10-27 IMAGING — CR DG CHEST 1V PORT
1 series · 1 of 1 positions shown · non-contrast
Comparison: 09/17/06.

CLINICAL DATA: Shortness of breath.  
 PORTABLE CHEST ? 1 VIEW:

[view not recorded]
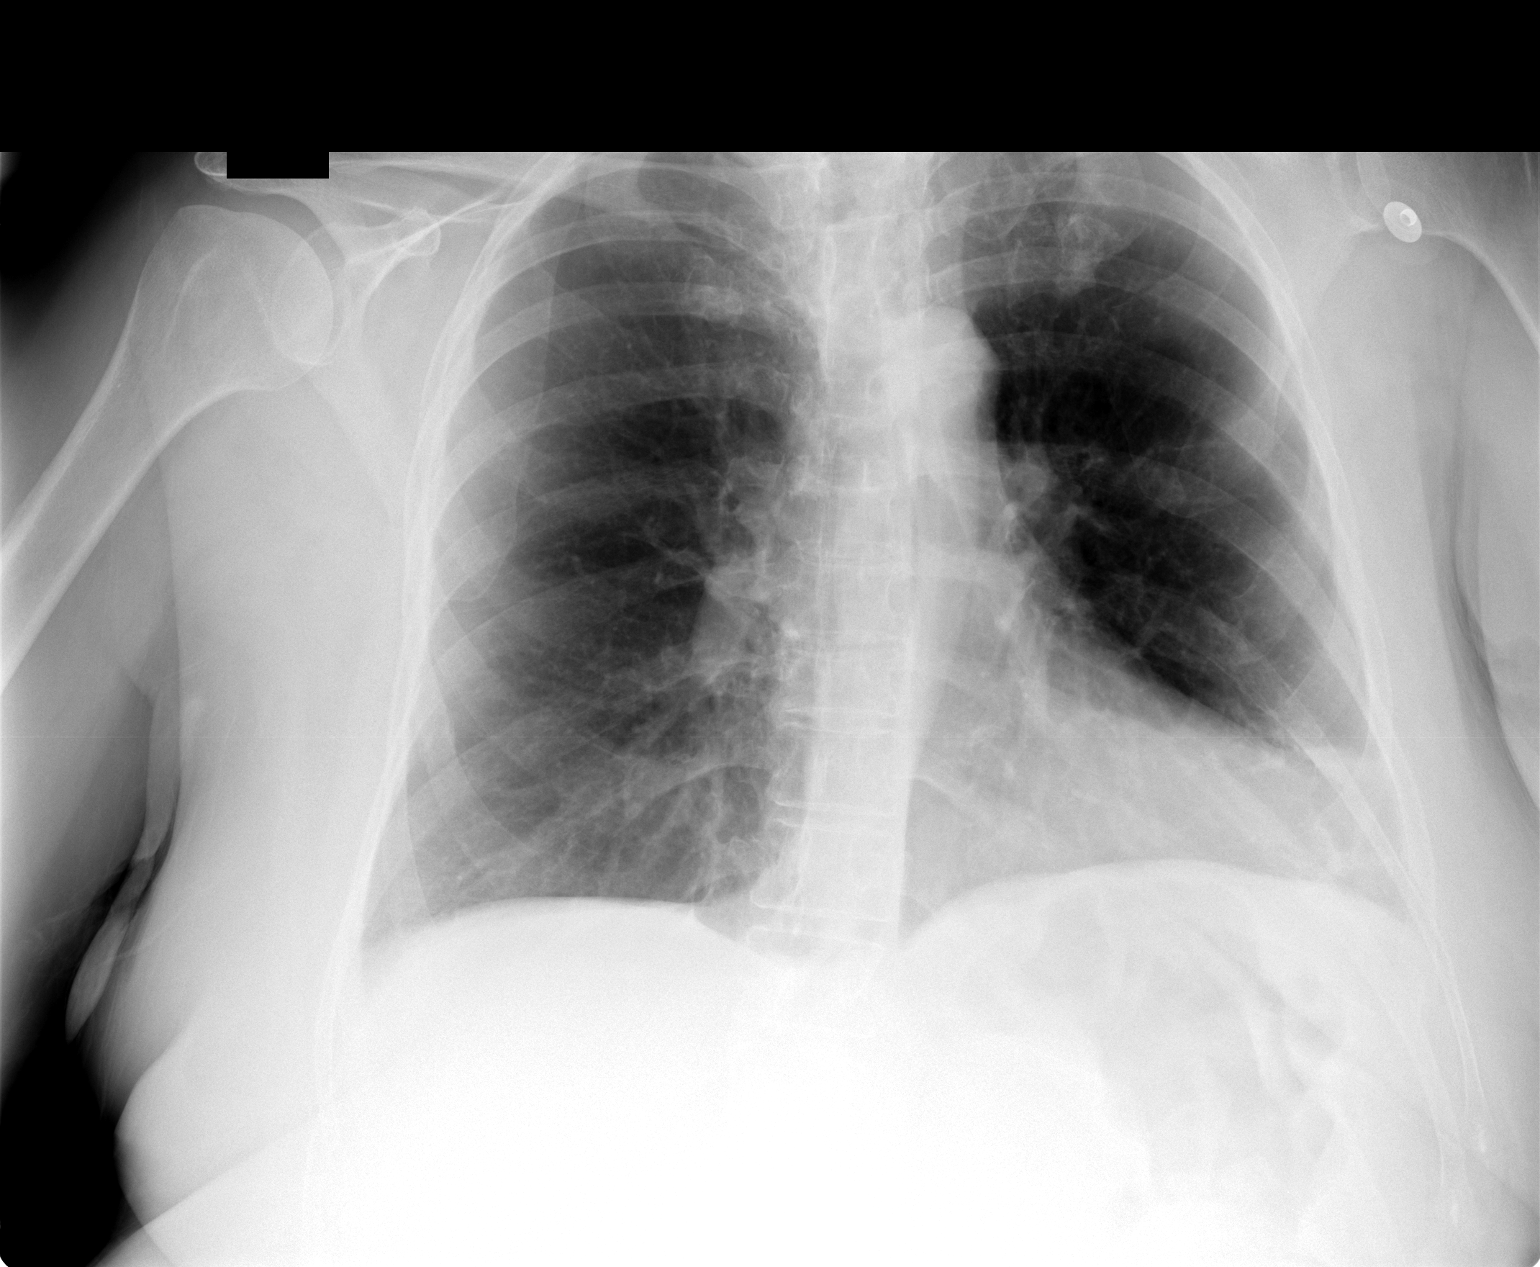

[1 of 1 positions shown; findings below may reference images not displayed]

FINDINGS: Lungs are less well aerated than previously seen.  There is left basilar atelectasis noted.  Right lung is clear.  Heart size is normal.
IMPRESSION: Left basilar atelectasis.

## 2006-11-03 ENCOUNTER — Emergency Department (HOSPITAL_COMMUNITY): Admission: EM | Admit: 2006-11-03 | Discharge: 2006-11-03 | Payer: Self-pay | Admitting: Emergency Medicine

## 2006-11-03 ENCOUNTER — Ambulatory Visit: Payer: Self-pay

## 2006-11-06 ENCOUNTER — Emergency Department (HOSPITAL_COMMUNITY): Admission: EM | Admit: 2006-11-06 | Discharge: 2006-11-06 | Payer: Self-pay | Admitting: Emergency Medicine

## 2006-11-08 ENCOUNTER — Emergency Department (HOSPITAL_COMMUNITY): Admission: EM | Admit: 2006-11-08 | Discharge: 2006-11-08 | Payer: Self-pay | Admitting: Emergency Medicine

## 2006-11-10 ENCOUNTER — Emergency Department (HOSPITAL_COMMUNITY): Admission: EM | Admit: 2006-11-10 | Discharge: 2006-11-10 | Payer: Self-pay | Admitting: Emergency Medicine

## 2006-11-12 ENCOUNTER — Emergency Department (HOSPITAL_COMMUNITY): Admission: EM | Admit: 2006-11-12 | Discharge: 2006-11-12 | Payer: Self-pay | Admitting: Emergency Medicine

## 2006-11-16 ENCOUNTER — Ambulatory Visit: Payer: Self-pay | Admitting: Family Medicine

## 2006-11-20 ENCOUNTER — Emergency Department (HOSPITAL_COMMUNITY): Admission: EM | Admit: 2006-11-20 | Discharge: 2006-11-20 | Payer: Self-pay | Admitting: Emergency Medicine

## 2006-11-24 ENCOUNTER — Emergency Department (HOSPITAL_COMMUNITY): Admission: EM | Admit: 2006-11-24 | Discharge: 2006-11-24 | Payer: Self-pay | Admitting: Emergency Medicine

## 2006-11-25 ENCOUNTER — Emergency Department (HOSPITAL_COMMUNITY): Admission: EM | Admit: 2006-11-25 | Discharge: 2006-11-26 | Payer: Self-pay | Admitting: Emergency Medicine

## 2006-11-27 ENCOUNTER — Emergency Department (HOSPITAL_COMMUNITY): Admission: EM | Admit: 2006-11-27 | Discharge: 2006-11-27 | Payer: Self-pay | Admitting: Emergency Medicine

## 2006-11-30 ENCOUNTER — Emergency Department (HOSPITAL_COMMUNITY): Admission: EM | Admit: 2006-11-30 | Discharge: 2006-11-30 | Payer: Self-pay | Admitting: Emergency Medicine

## 2006-12-01 ENCOUNTER — Encounter: Payer: Self-pay | Admitting: Emergency Medicine

## 2006-12-01 ENCOUNTER — Ambulatory Visit: Payer: Self-pay | Admitting: Family Medicine

## 2006-12-01 ENCOUNTER — Inpatient Hospital Stay (HOSPITAL_COMMUNITY): Admission: AD | Admit: 2006-12-01 | Discharge: 2006-12-03 | Payer: Self-pay | Admitting: Family Medicine

## 2006-12-11 IMAGING — CR DG CHEST 2V
2 series · 2 of 2 positions shown · non-contrast
Comparison: 09/24/06.

CLINICAL DATA: Short of breath, back pain.
 CHEST ? 2 VIEW:

[w chest lat]
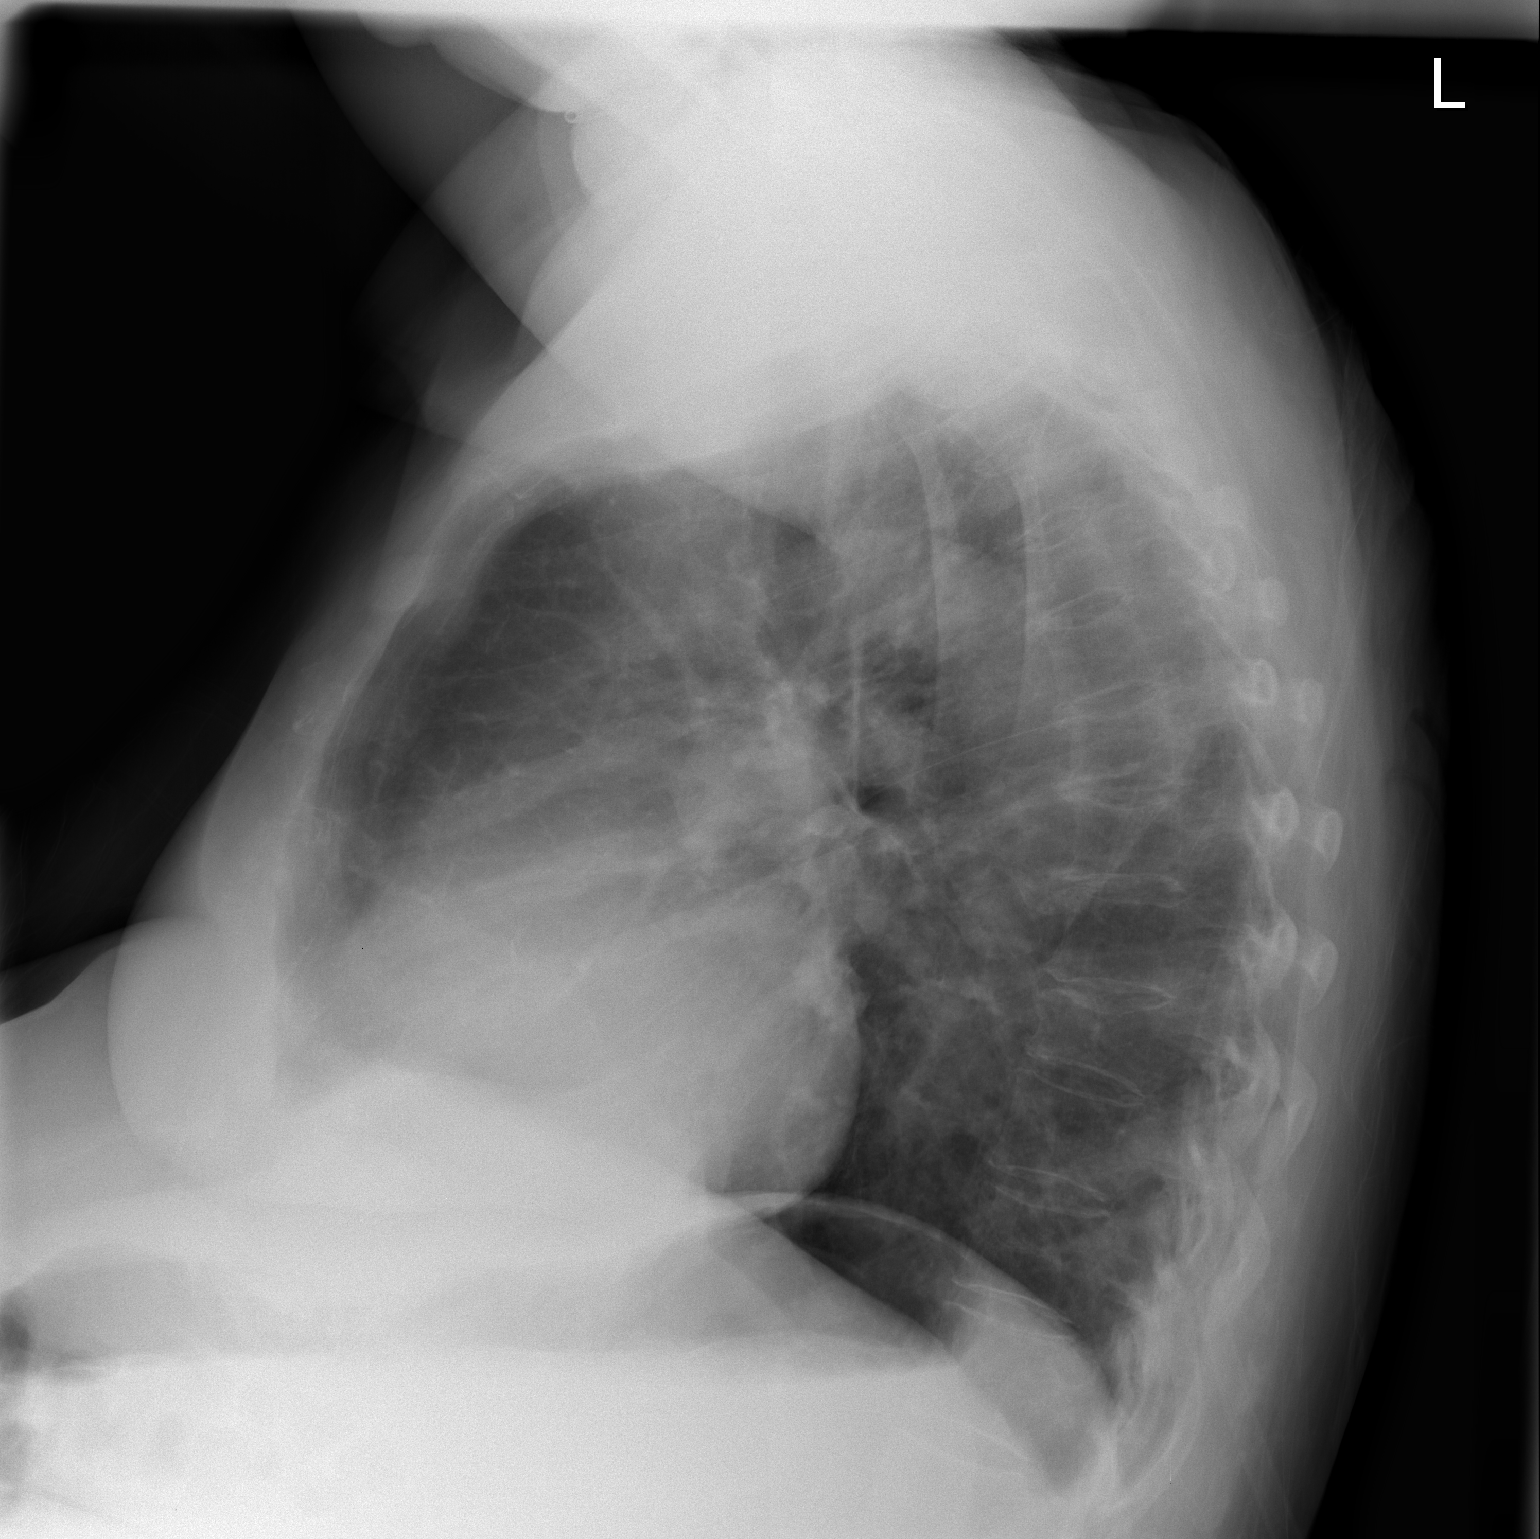

[view not recorded]
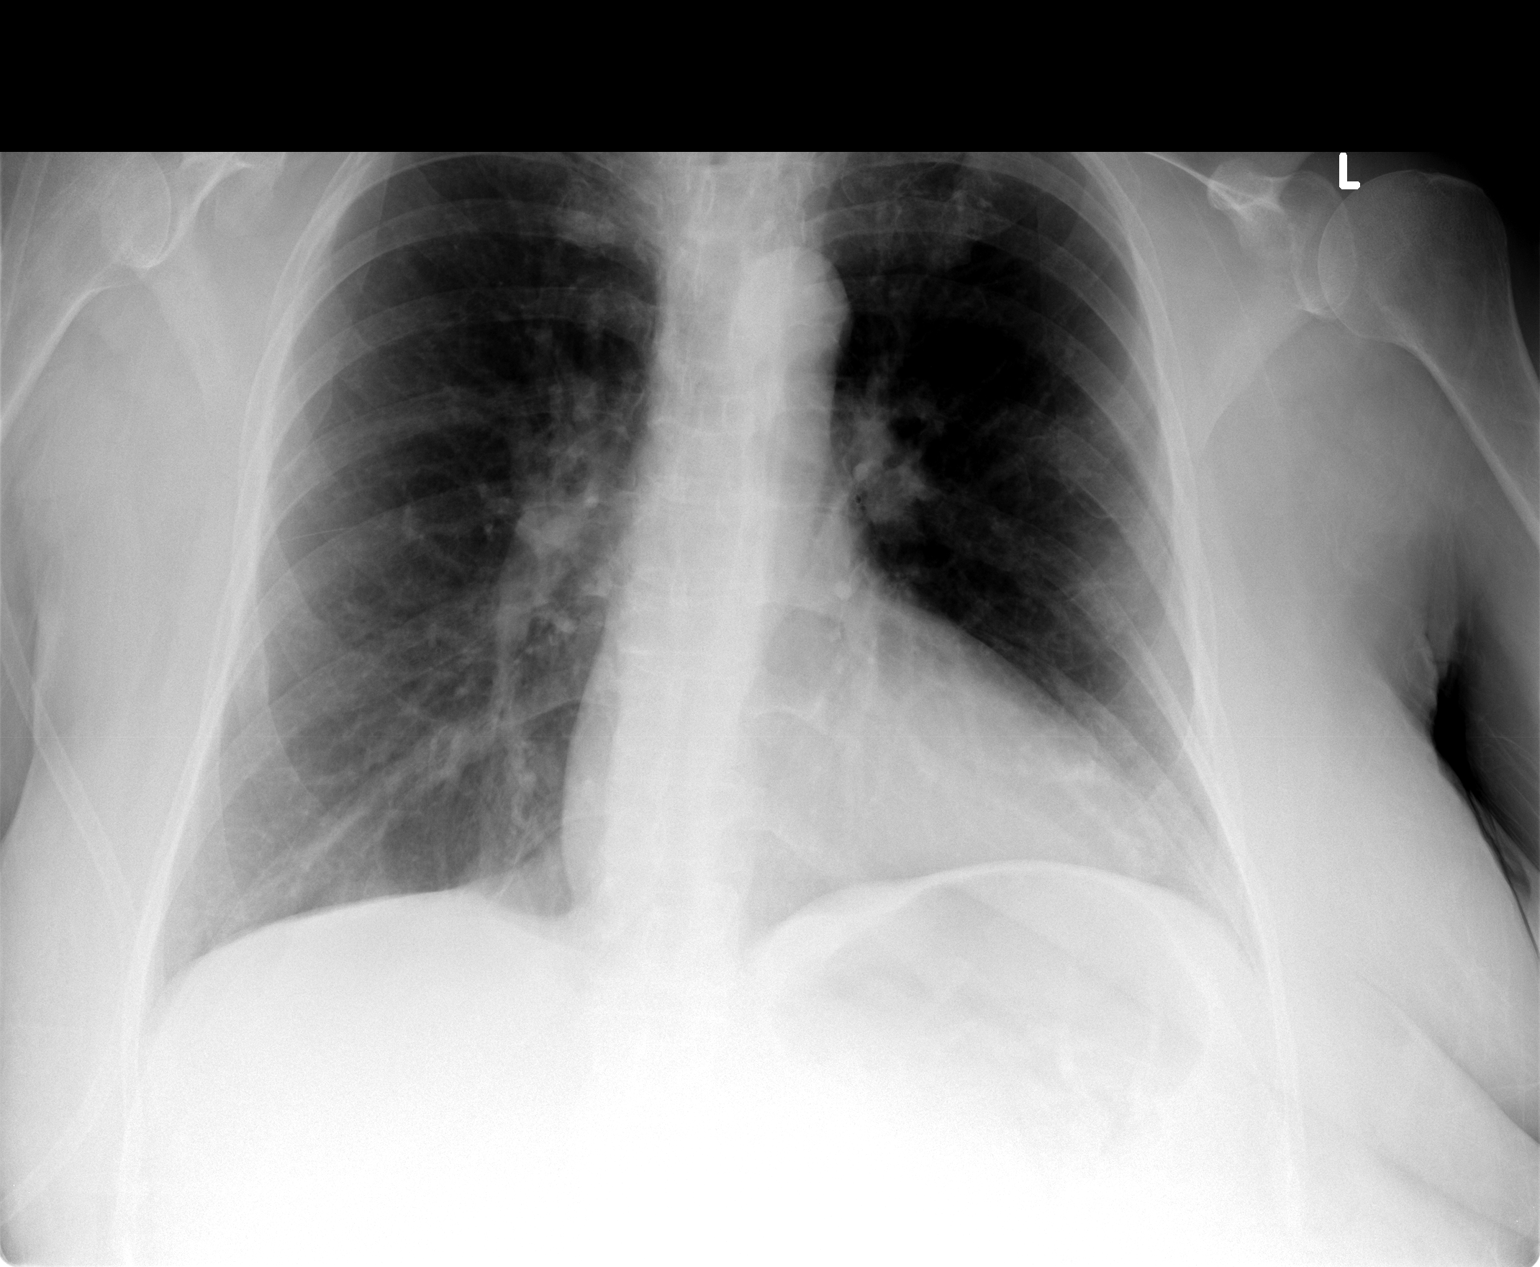

[2 of 2 positions shown; findings below may reference images not displayed]

FINDINGS: Heart is mildly enlarged.  There is no heart failure or infiltrate.  The lungs are clear.
IMPRESSION: No acute abnormality.

## 2006-12-22 ENCOUNTER — Emergency Department (HOSPITAL_COMMUNITY): Admission: EM | Admit: 2006-12-22 | Discharge: 2006-12-22 | Payer: Self-pay | Admitting: Emergency Medicine

## 2006-12-23 ENCOUNTER — Emergency Department (HOSPITAL_COMMUNITY): Admission: EM | Admit: 2006-12-23 | Discharge: 2006-12-23 | Payer: Self-pay | Admitting: Emergency Medicine

## 2006-12-23 IMAGING — CR DG LUMBAR SPINE COMPLETE 4+V
5 series · 5 of 5 positions shown · non-contrast
Comparison: none

CLINICAL DATA: Low back pain.  No injury. 
 LUMBAR SPINE ? 4 VIEW:

[view not recorded (1 of 5)]
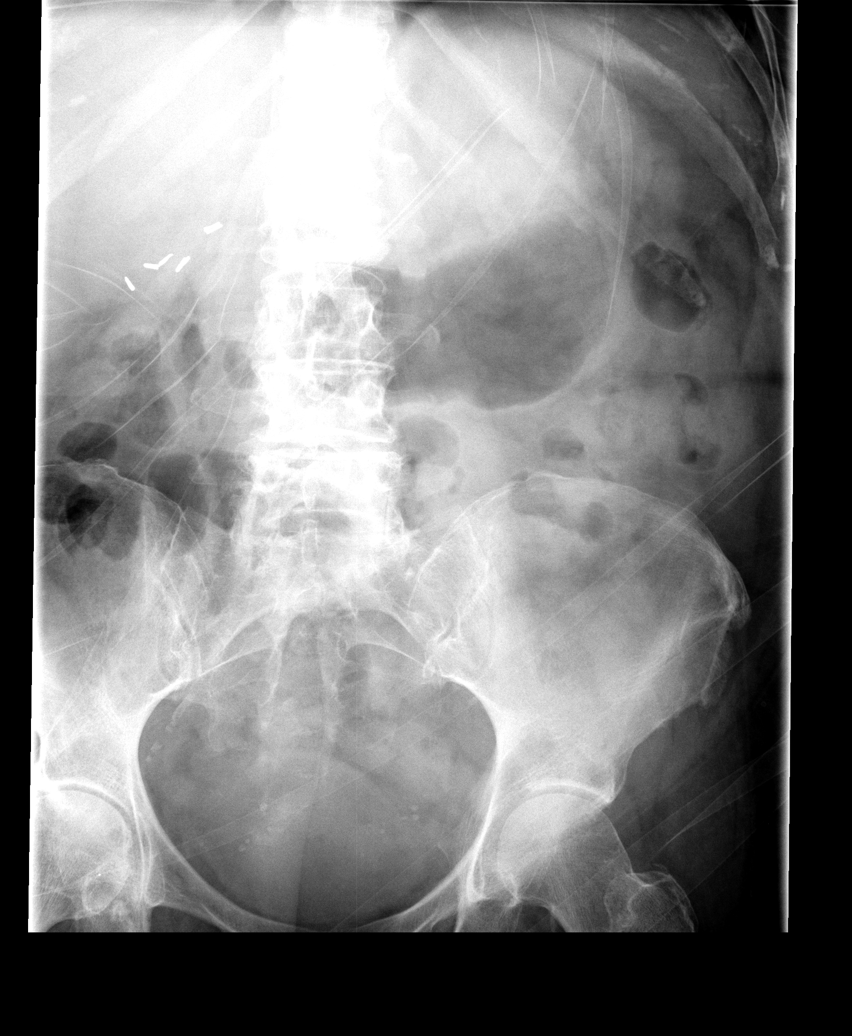

[view not recorded (2 of 5)]
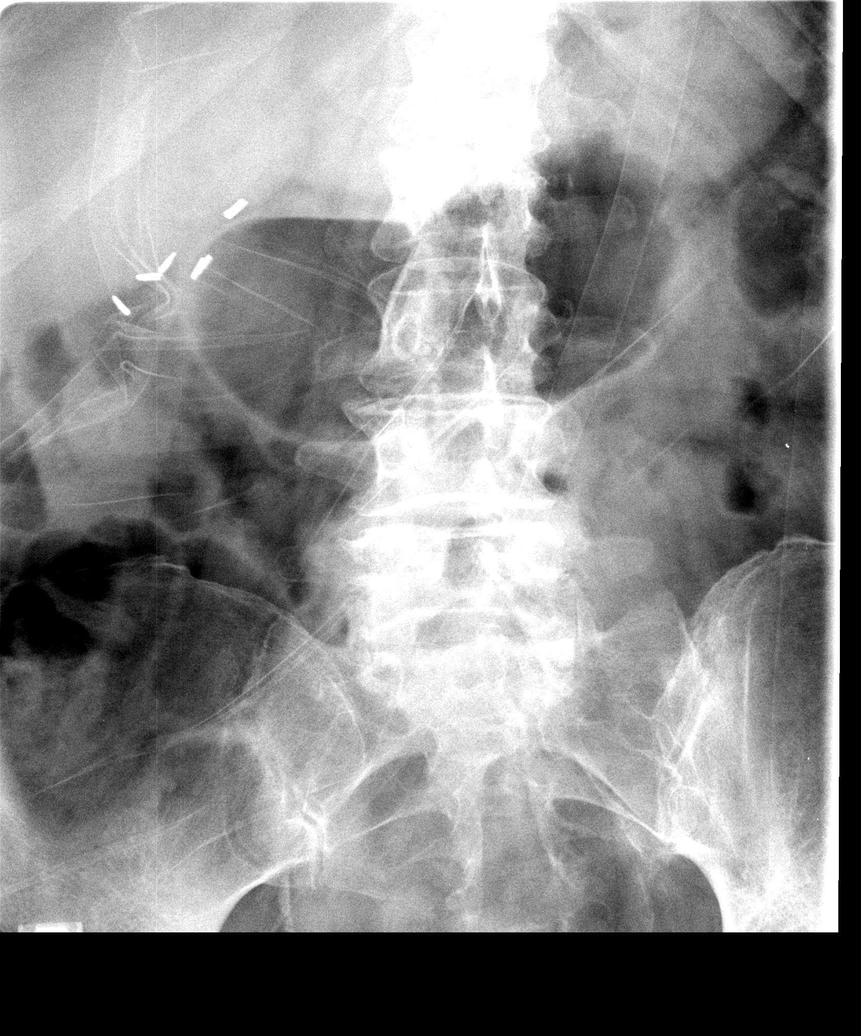

[view not recorded (3 of 5)]
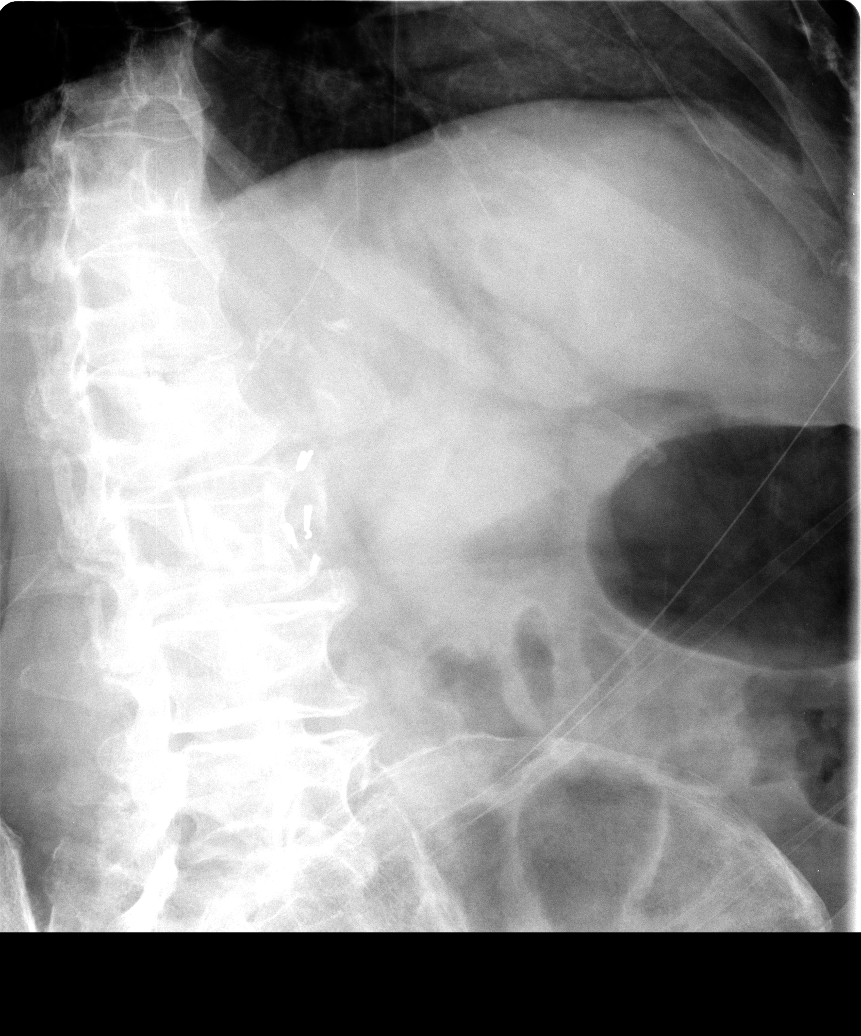

[view not recorded (4 of 5)]
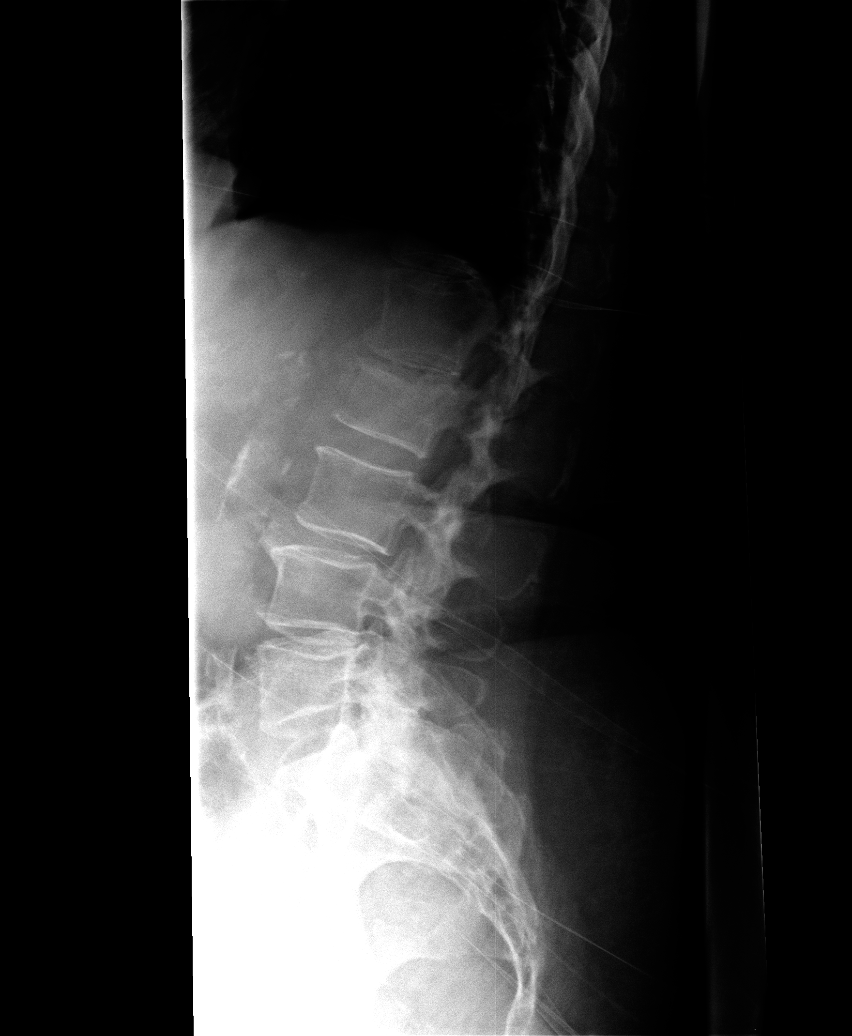

[view not recorded (5 of 5)]
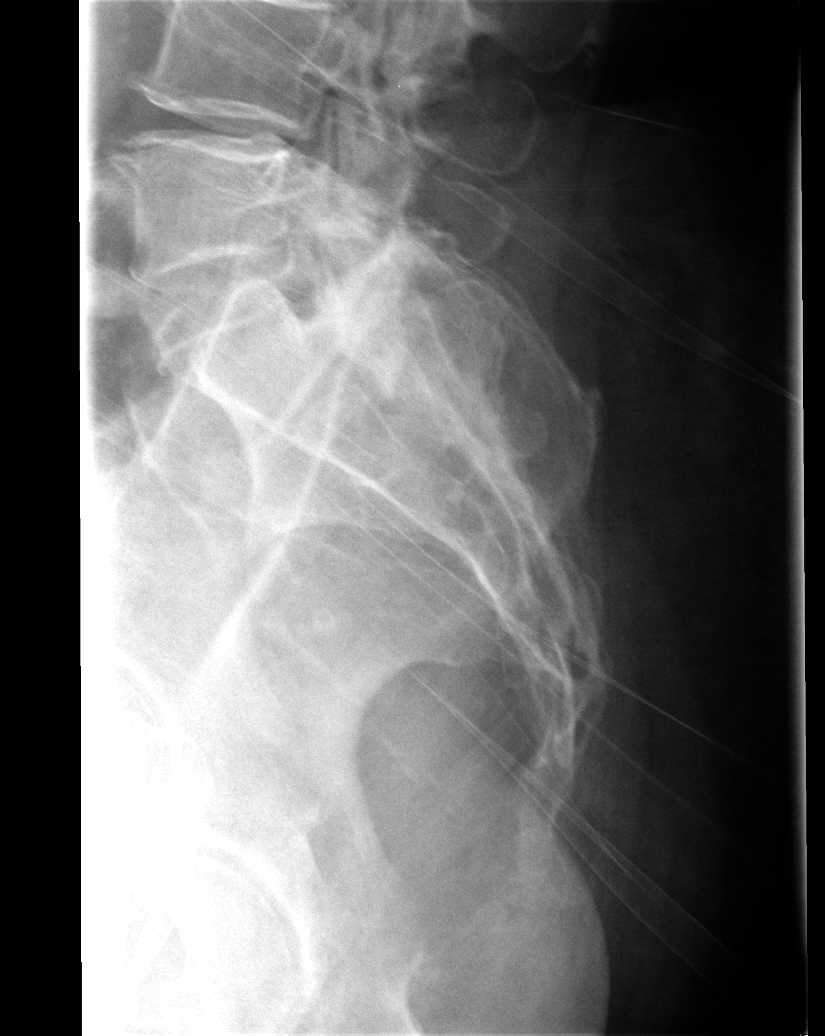

[5 of 5 positions shown; findings below may reference images not displayed]

FINDINGS: The lumbar vertebrae are in normal alignment.  Mild degenerative disk disease is noted at L4-5.  No definite compression deformity is seen.  The SI joints appear normal.
IMPRESSION: Normal alignment with no acute abnormality.  Mild degenerative disk disease at L4-5.

## 2006-12-24 ENCOUNTER — Emergency Department (HOSPITAL_COMMUNITY): Admission: EM | Admit: 2006-12-24 | Discharge: 2006-12-24 | Payer: Self-pay | Admitting: Emergency Medicine

## 2006-12-25 ENCOUNTER — Emergency Department (HOSPITAL_COMMUNITY): Admission: EM | Admit: 2006-12-25 | Discharge: 2006-12-25 | Payer: Self-pay | Admitting: Emergency Medicine

## 2006-12-29 IMAGING — CR DG LUMBAR SPINE COMPLETE 4+V
5 series · 5 of 5 positions shown · non-contrast
Comparison: lumbar spine 11/20/06 and 04/17/06

CLINICAL DATA: Fall with pain radiating down both legs.  
 LUMBAR SPINE ? 4 VIEW:

[t l-spine a.p.]
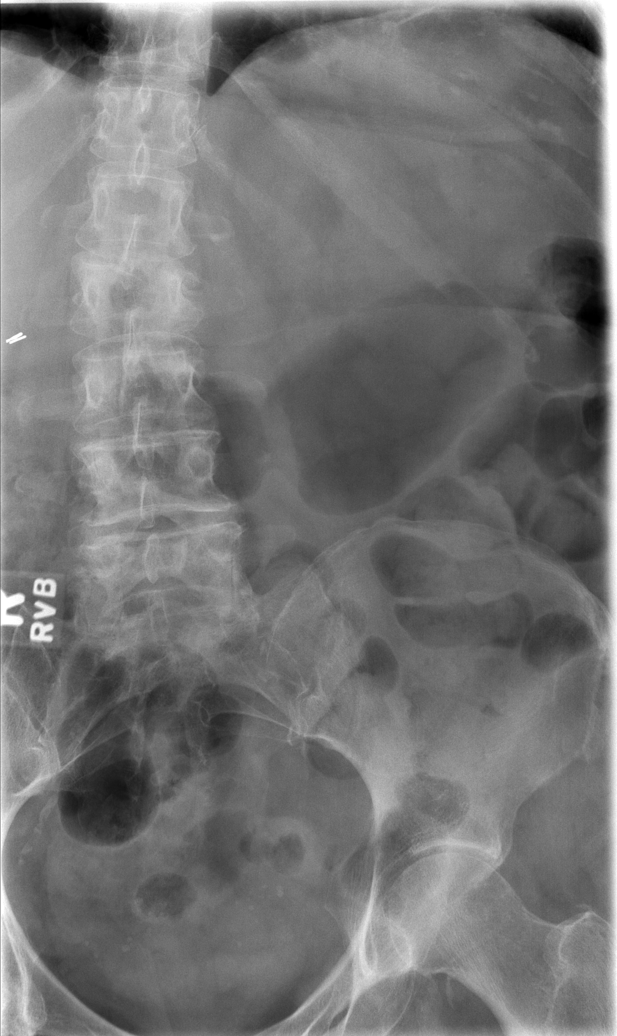

[t l-spine oblique exposure * (1 of 2)]
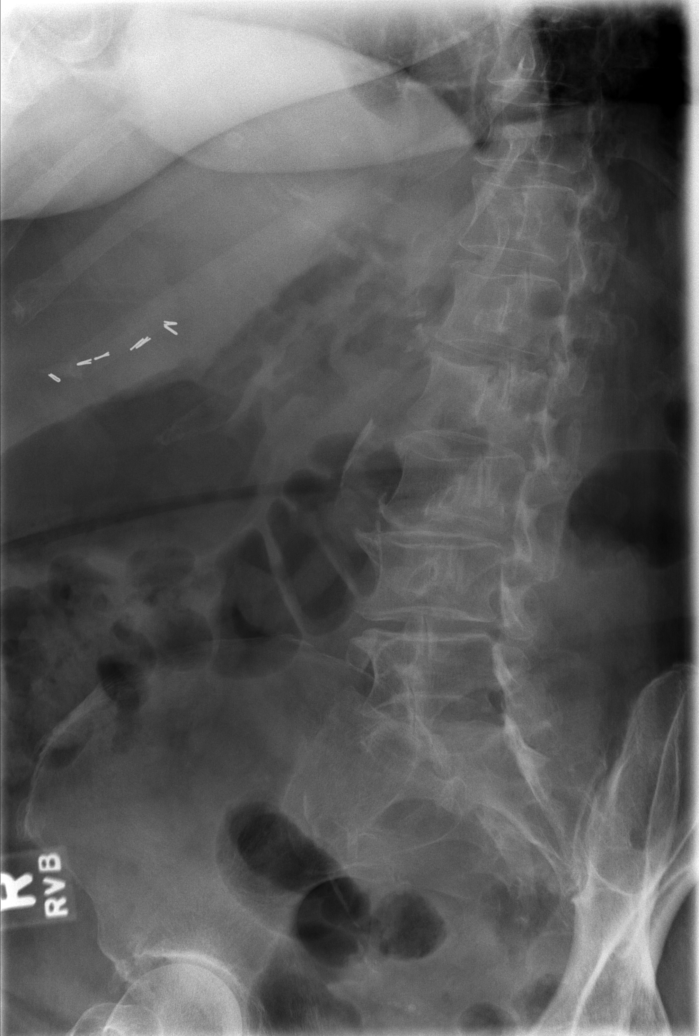

[t l-spine oblique exposure * (2 of 2)]
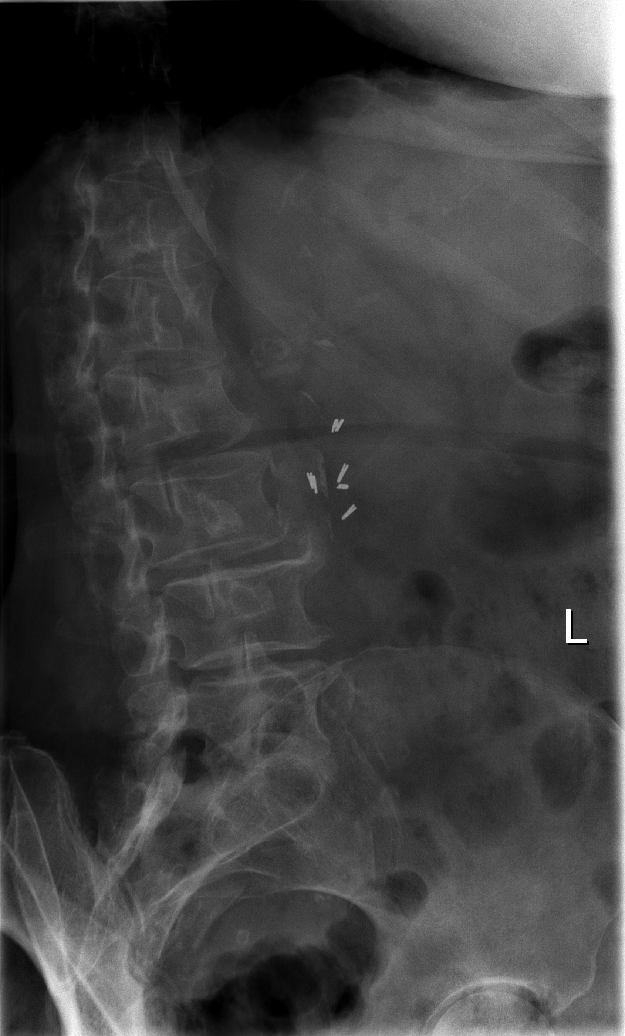

[t l-spine lat]
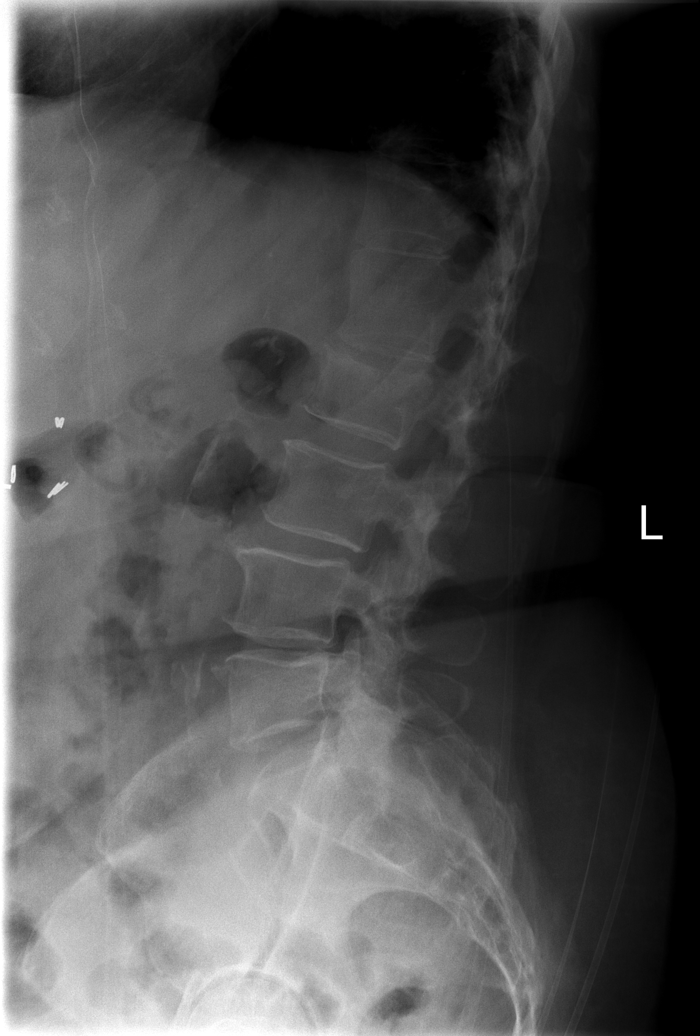

[t l-spine l5-s1 spot]
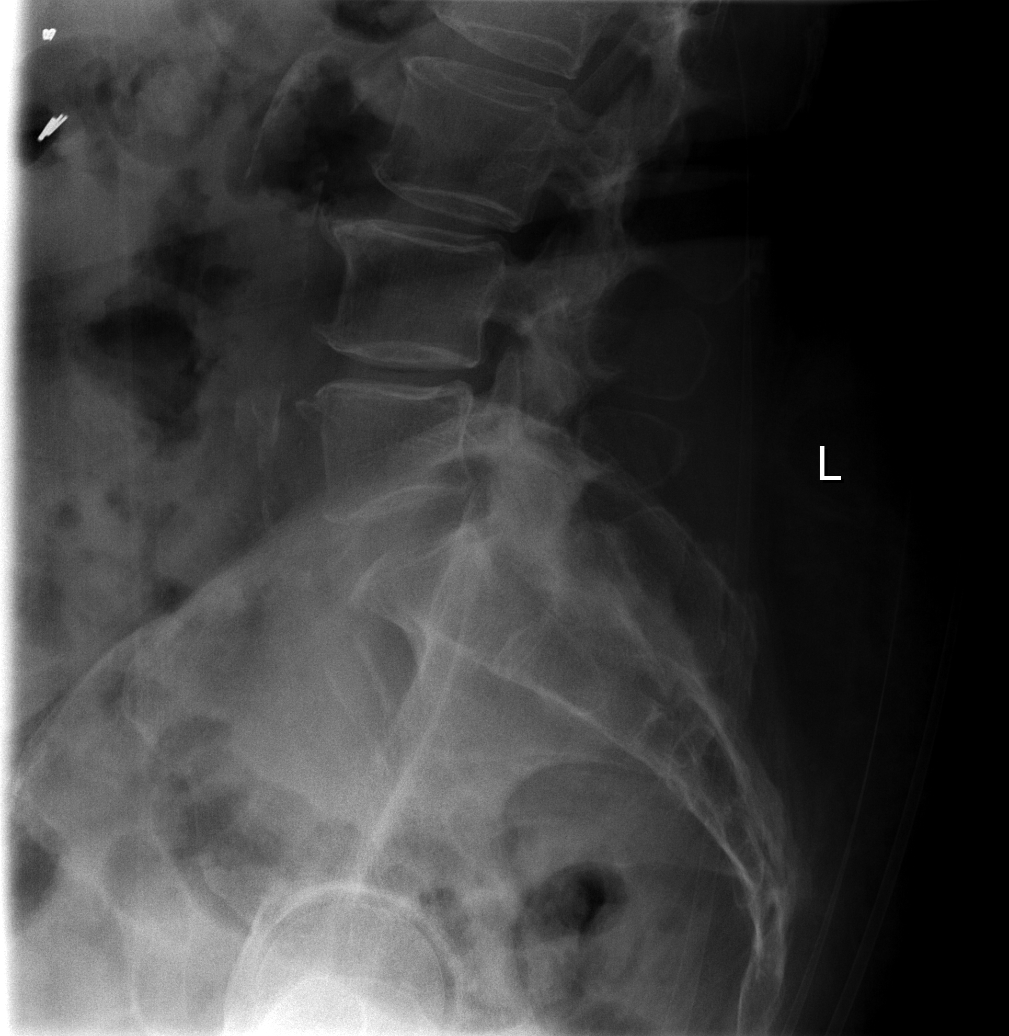

[5 of 5 positions shown; findings below may reference images not displayed]

FINDINGS: There is an L1 superior endplate compression fracture which is present on the [DATE] study but not on the 04/17/06 study.  Degenerative disc disease L4-5 is noted.  Facet arthropathy lower lumbar spine.
IMPRESSION: Superior endplate compression fracture of L2 new since 04/17/06.

## 2006-12-30 ENCOUNTER — Emergency Department (HOSPITAL_COMMUNITY): Admission: EM | Admit: 2006-12-30 | Discharge: 2006-12-30 | Payer: Self-pay | Admitting: Emergency Medicine

## 2007-01-03 IMAGING — CT CT ANGIO CHEST
2 of 6 series · 13 of 36 positions shown · IV contrast (APPLIED)
Comparison: 11/03/05.

CLINICAL DATA: Shortness of breath.  
 CT ANGIOGRAPHY OF CHEST ? 12/01/06:
TECHNIQUE: Multidetector CT imaging of the chest was performed during bolus injection of intravenous contrast.  Multiplanar CT angiographic image reconstructions were generated to evaluate the vascular anatomy. 
 Contrast:  160 cc Omnipaque 300 IV and oral contrast.  (The initial 80 cc bolus was not diagnostic and so the study was repeated with an additional 80 cc).

[Series 5: pe 3.0 b40f · axial · 0.70mm/px · z∈[-225,+18]mm · 12 of 97 slices shown]
[im 8/97  lung]
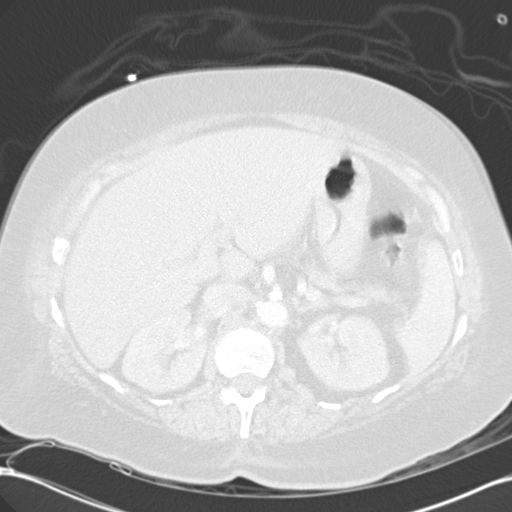
[im 15/97  mediastinal]
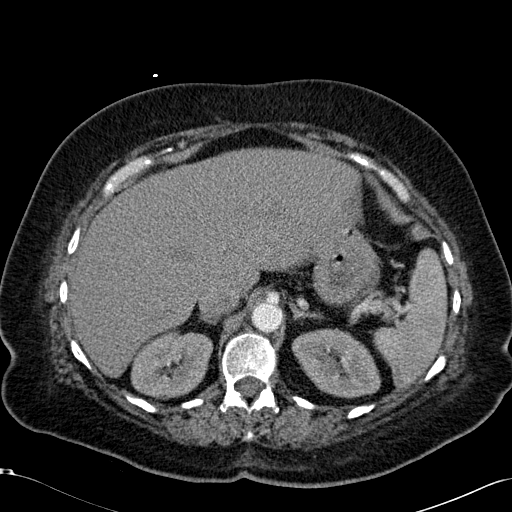
[im 23/97  lung]
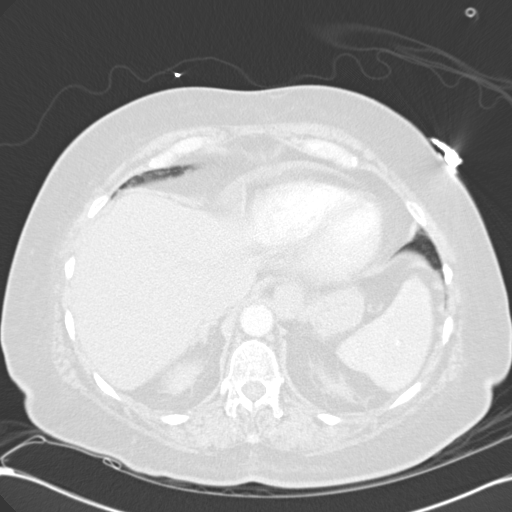
[im 30/97  mediastinal]
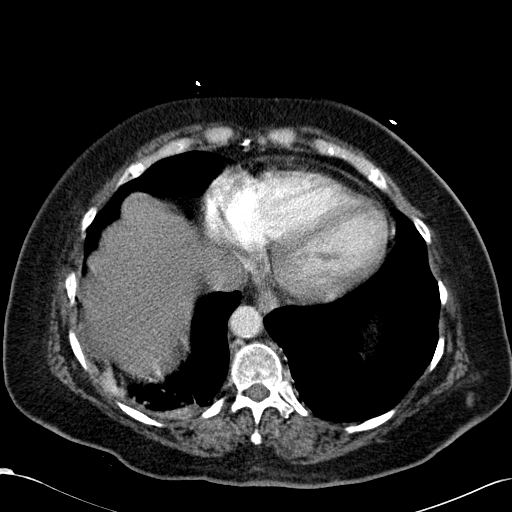
[im 37/97  lung]
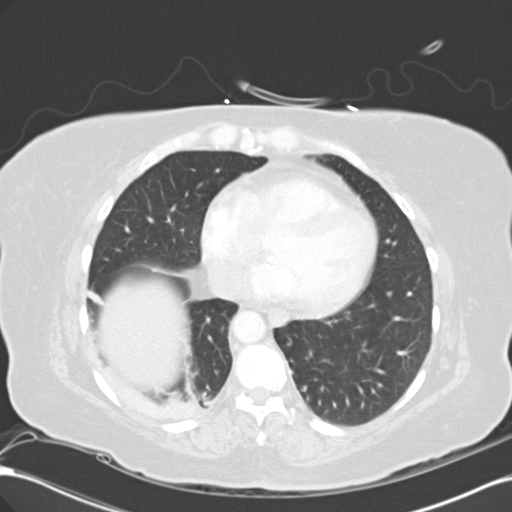
[im 45/97  mediastinal]
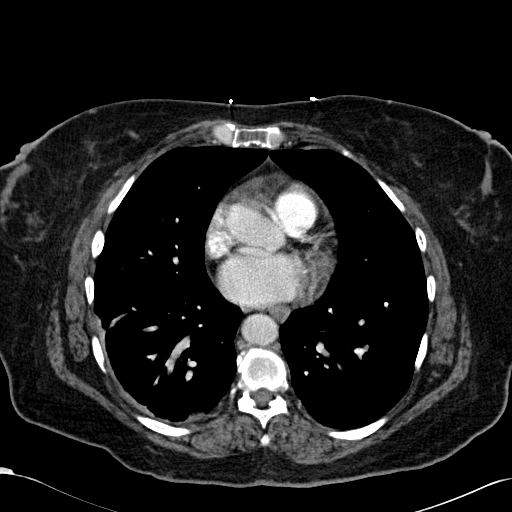
[im 52/97  lung]
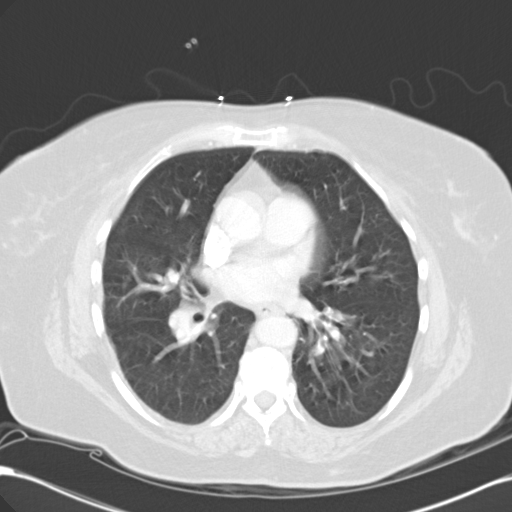
[im 60/97  mediastinal]
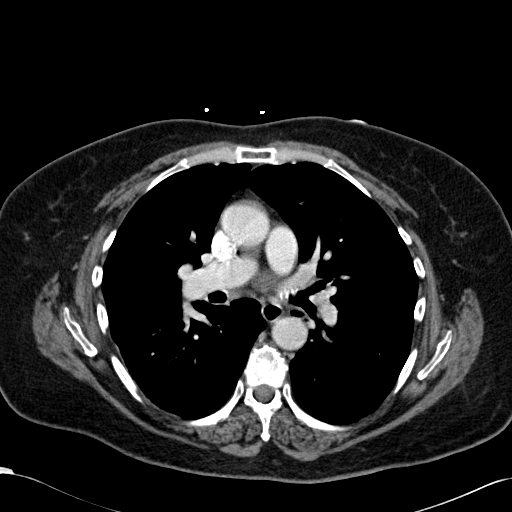
[im 67/97  lung]
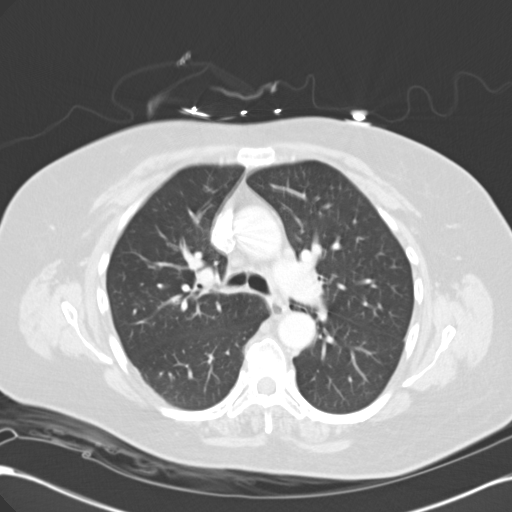
[im 74/97  mediastinal]
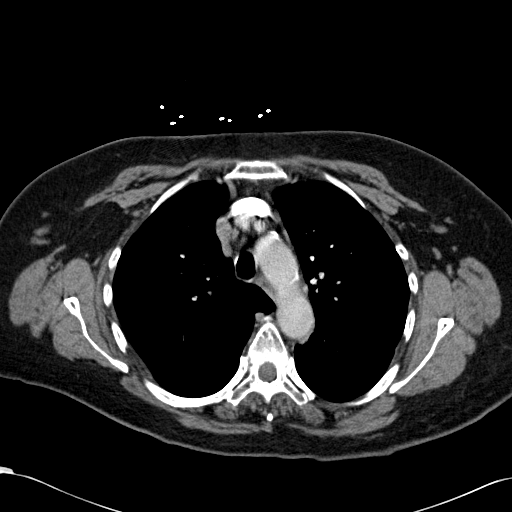
[im 82/97  lung]
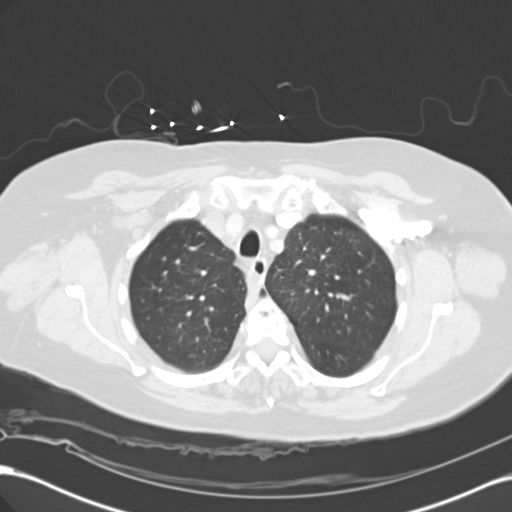
[im 89/97  mediastinal]
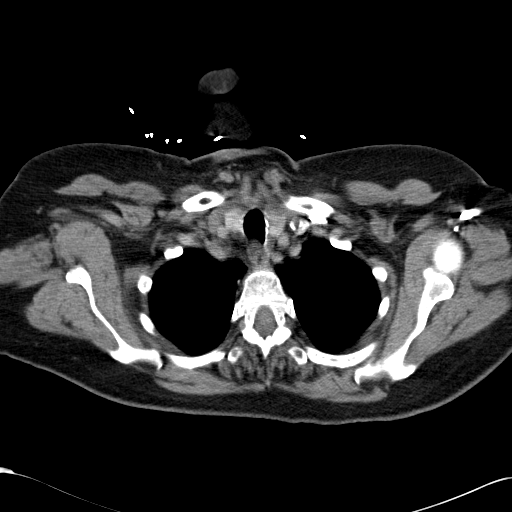

[Series 602: <mpr thick range> · coronal · 0.70mm/px · 1 of 71 slices shown]
[im 36/71  mediastinal]
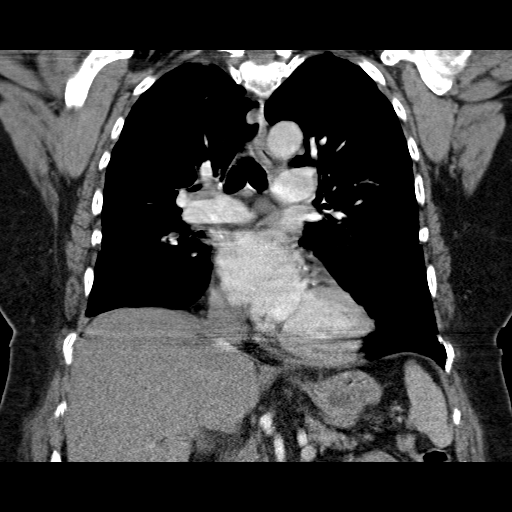

[13 of 36 positions shown; findings below may reference images not displayed]

FINDINGS: In three planes, there are no definite pulmonary emboli.  There is some parenchymal disease in the periphery of the right lower lobe that was not present on the prior study.  This may represent early pneumonia.  The left lung is clear.  There is no mediastinal or hilar adenopathy.  There are no axillary nodes or chest wall masses.
IMPRESSION: 1.  No evidence for pulmonary emboli.
 2.  There is some patchy right lower lobe pleuroparenchymal disease that was not present previously.  Probable atelectasis or atelectatic pneumonia.

## 2007-01-03 IMAGING — CR DG CHEST 1V PORT
1 series · 1 of 1 positions shown · non-contrast
Comparison: 11/08/06.

CLINICAL DATA: Shortness of breath.  Fever.  Chills. 
 PORTABLE CHEST - 1 VIEW 12/01/06 AT 4533 HOURS:

[view not recorded]
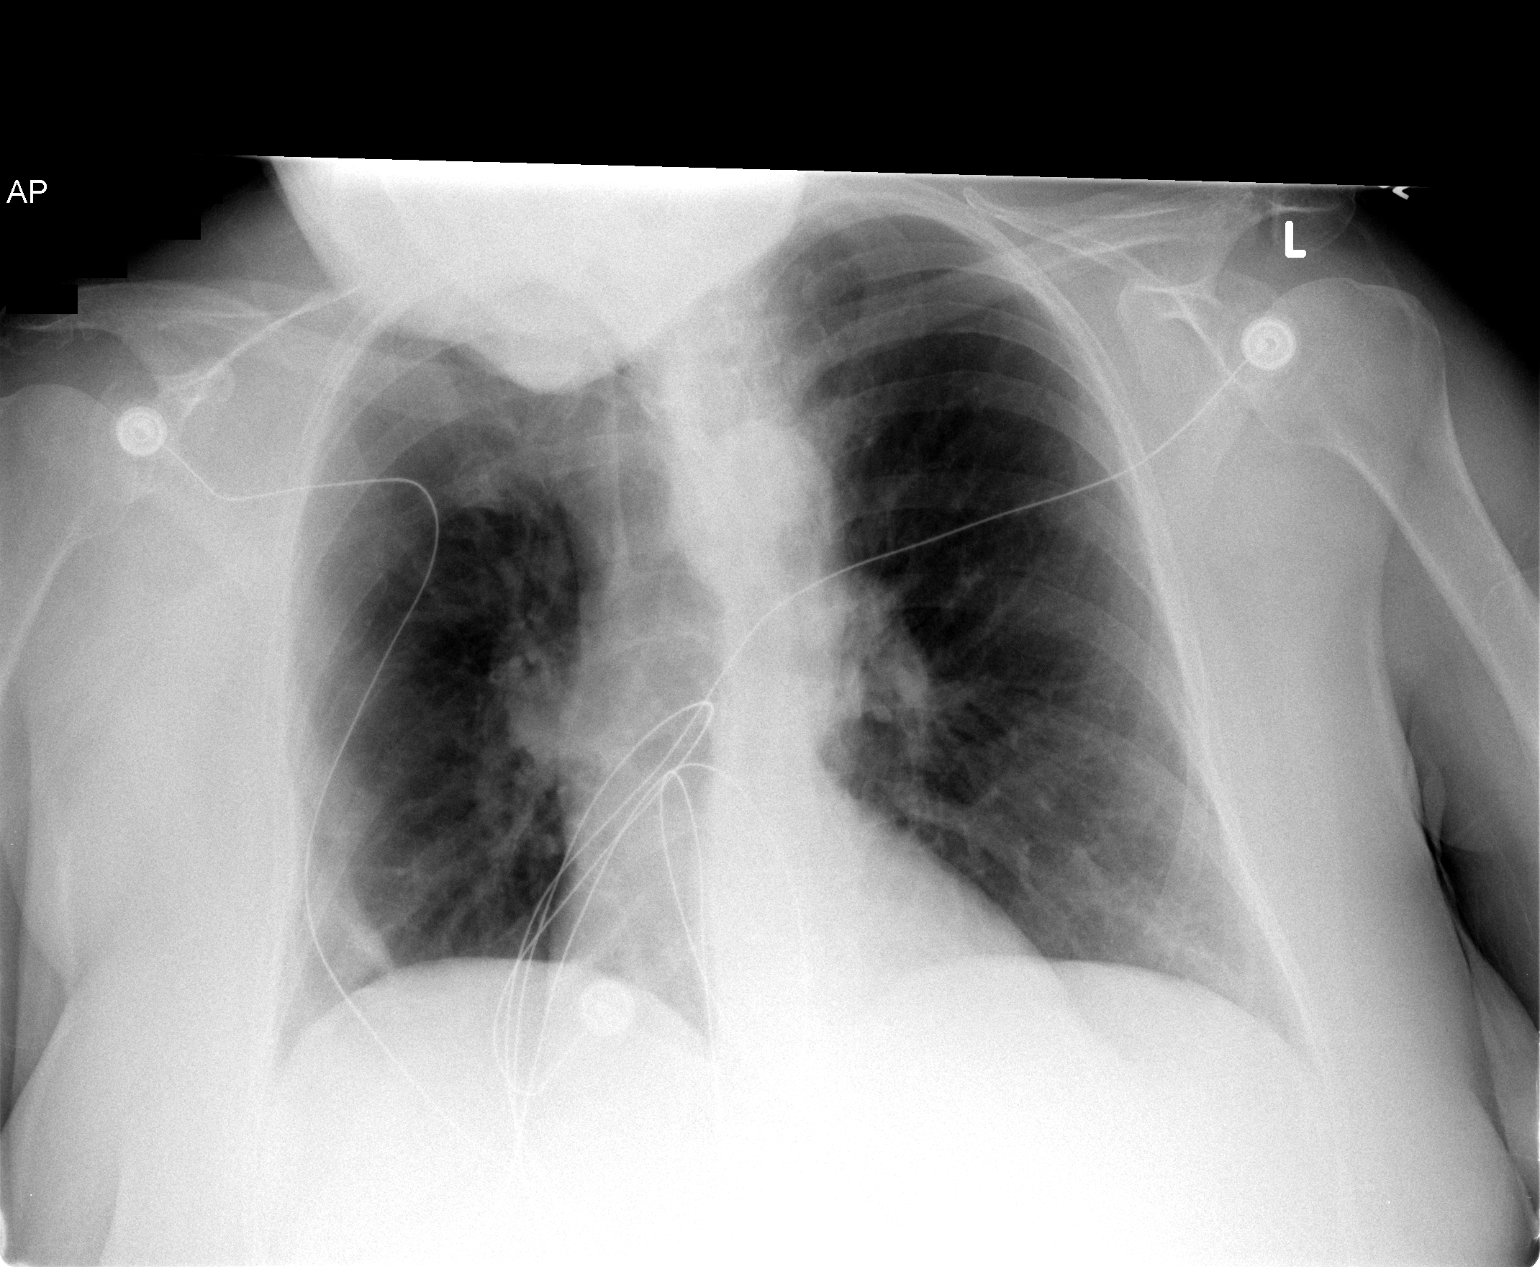

[1 of 1 positions shown; findings below may reference images not displayed]

FINDINGS: Heart size is within normal limits.  There is no heart failure.  There is a linear density in the right lower lobe which is probably an area of atelectasis.  There is no effusion and the left lung is clear.
IMPRESSION: Right lower lobe atelectasis.

## 2007-01-04 IMAGING — CR DG CHEST 2V
2 series · 2 of 2 positions shown · non-contrast
Comparison: 12/01/06.

CLINICAL DATA: Pneumonia.  COPD.
 CHEST - 2 VIEW:

[w chest pa]
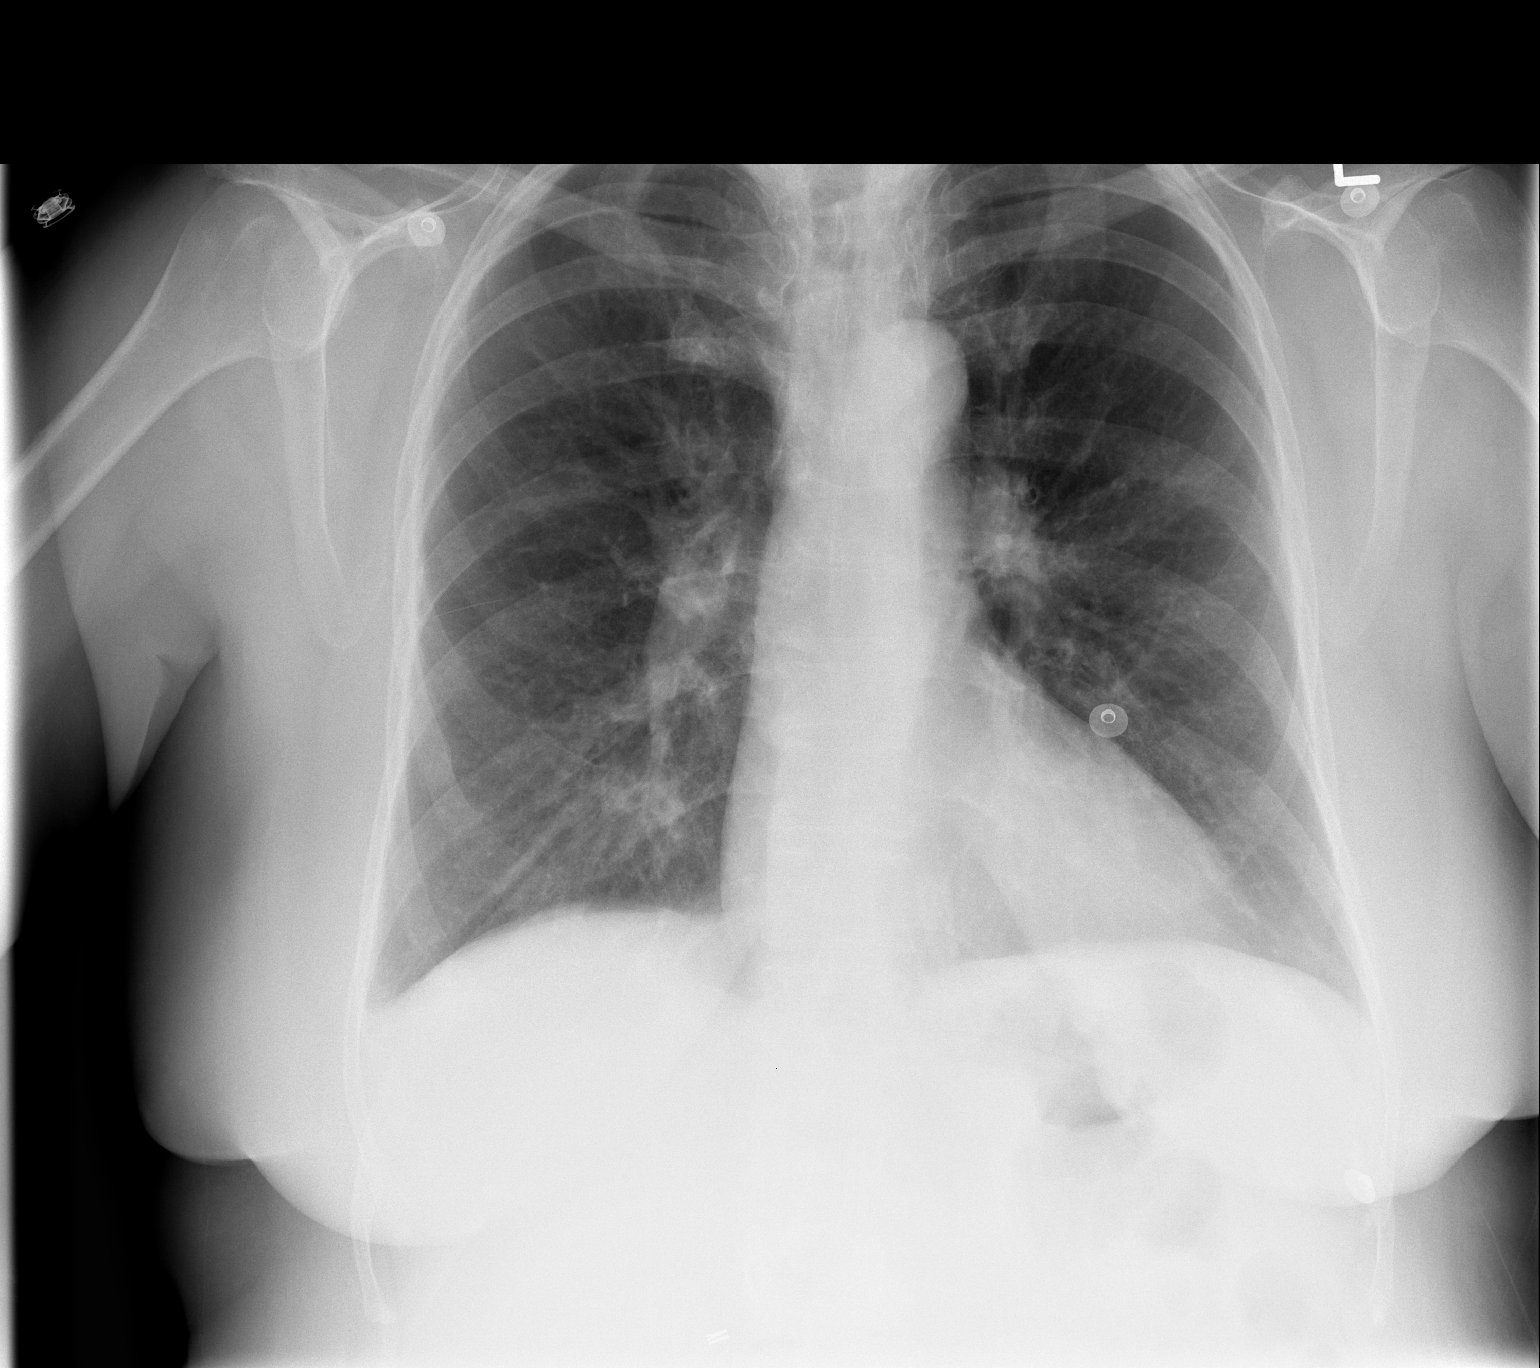

[w chest lat]
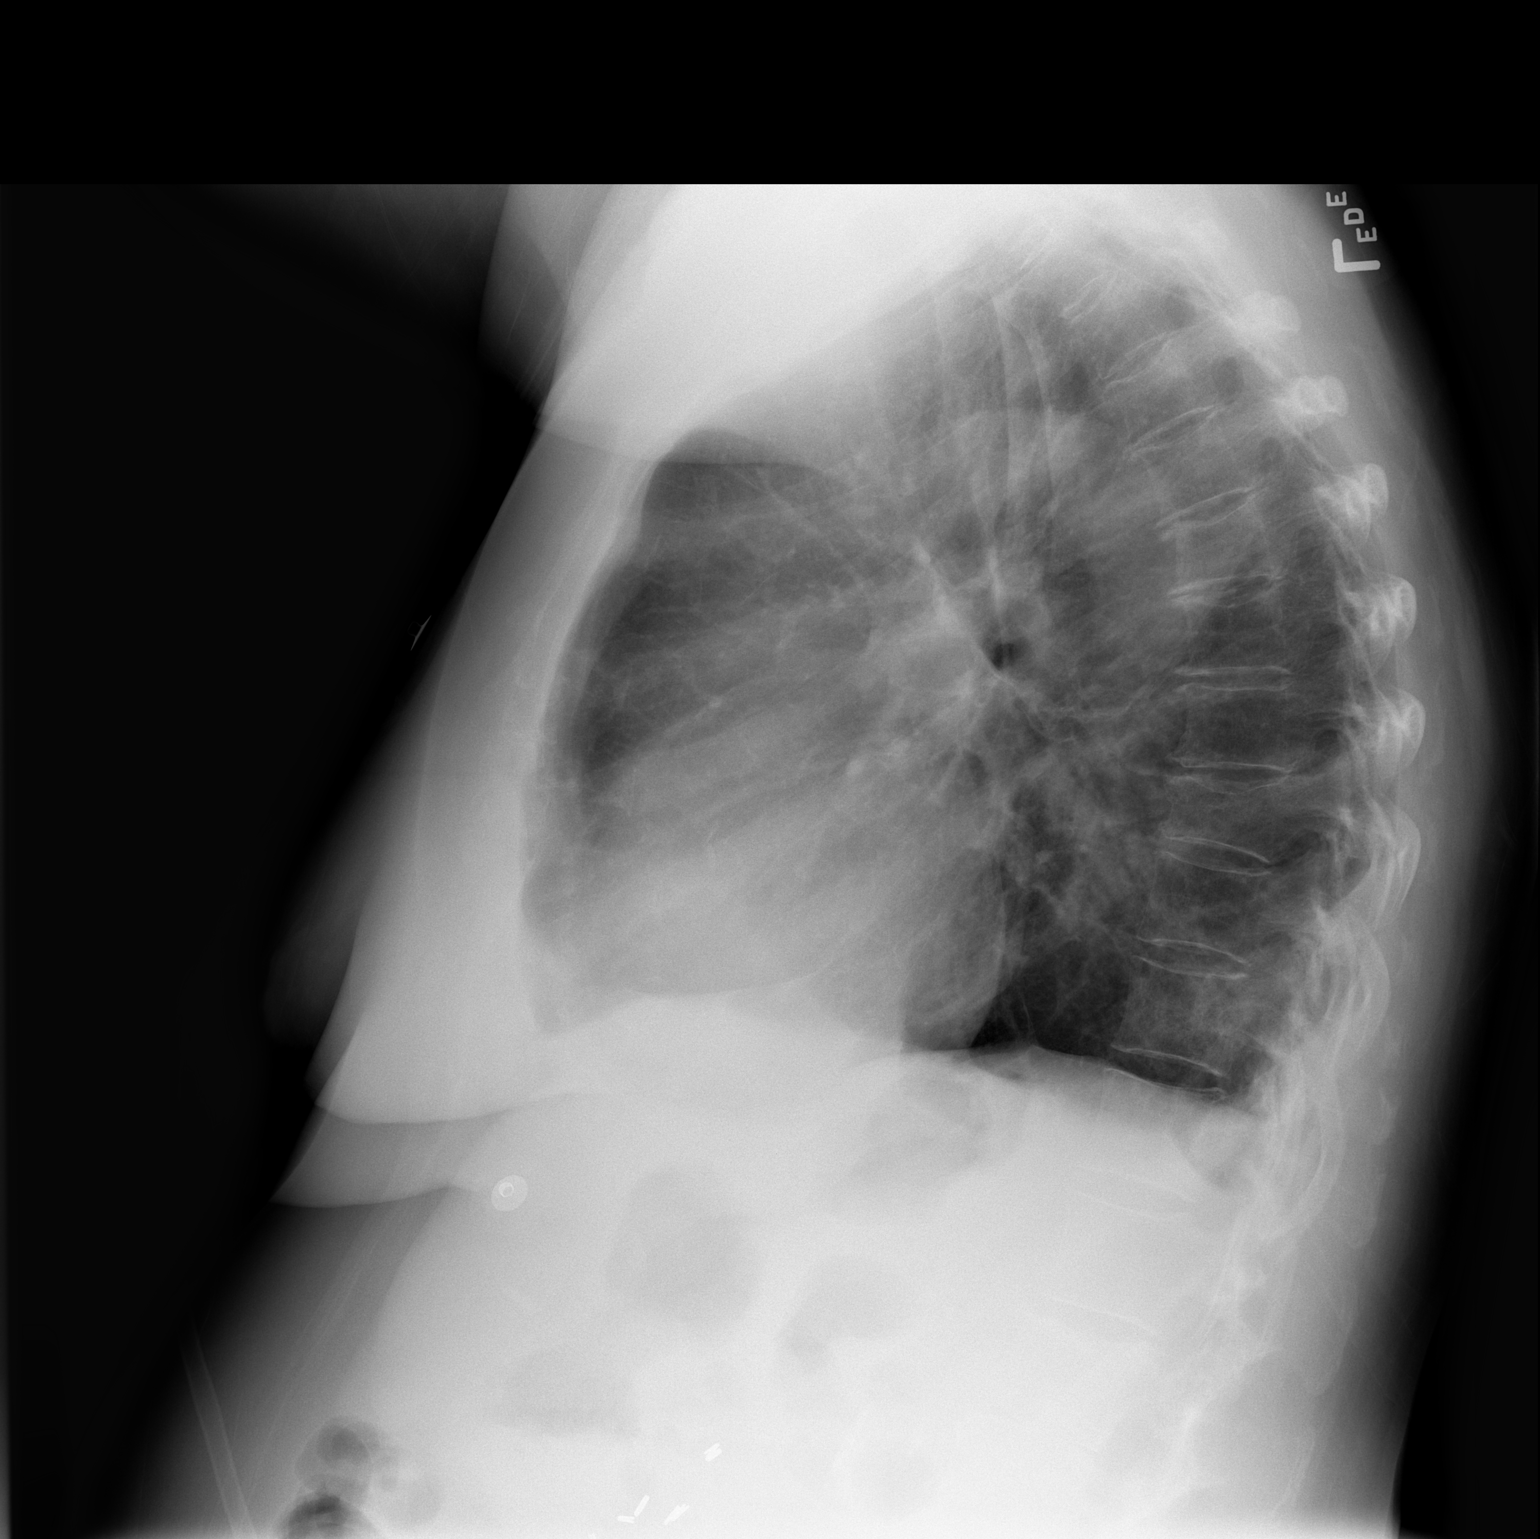

[2 of 2 positions shown; findings below may reference images not displayed]

FINDINGS: Right lower lobe density has resolved and apparently with an area of atelectasis.  No pneumonia is present.  There is no heart failure.  No significant effusion.
IMPRESSION: No acute abnormality.

## 2007-01-05 ENCOUNTER — Emergency Department (HOSPITAL_COMMUNITY): Admission: EM | Admit: 2007-01-05 | Discharge: 2007-01-05 | Payer: Self-pay | Admitting: Emergency Medicine

## 2007-01-07 ENCOUNTER — Emergency Department (HOSPITAL_COMMUNITY): Admission: EM | Admit: 2007-01-07 | Discharge: 2007-01-07 | Payer: Self-pay | Admitting: Emergency Medicine

## 2007-01-09 ENCOUNTER — Emergency Department (HOSPITAL_COMMUNITY): Admission: EM | Admit: 2007-01-09 | Discharge: 2007-01-09 | Payer: Self-pay | Admitting: Emergency Medicine

## 2007-01-12 ENCOUNTER — Inpatient Hospital Stay (HOSPITAL_COMMUNITY): Admission: EM | Admit: 2007-01-12 | Discharge: 2007-01-14 | Payer: Self-pay | Admitting: Family Medicine

## 2007-01-12 ENCOUNTER — Ambulatory Visit: Payer: Self-pay | Admitting: Family Medicine

## 2007-01-18 ENCOUNTER — Ambulatory Visit: Payer: Self-pay | Admitting: Family Medicine

## 2007-01-21 ENCOUNTER — Emergency Department (HOSPITAL_COMMUNITY): Admission: EM | Admit: 2007-01-21 | Discharge: 2007-01-21 | Payer: Self-pay | Admitting: Emergency Medicine

## 2007-01-22 ENCOUNTER — Emergency Department (HOSPITAL_COMMUNITY): Admission: EM | Admit: 2007-01-22 | Discharge: 2007-01-22 | Payer: Self-pay | Admitting: Emergency Medicine

## 2007-01-26 ENCOUNTER — Emergency Department (HOSPITAL_COMMUNITY): Admission: EM | Admit: 2007-01-26 | Discharge: 2007-01-26 | Payer: Self-pay | Admitting: Emergency Medicine

## 2007-01-26 IMAGING — CT CT HEAD W/O CM
1 series · 16 of 30 positions shown, 20 images · IV contrast (agent unspecified)
Comparison: CT of the head of 07/22/06.

CLINICAL DATA: Headache, sensitivity to light.
 HEAD CT WITHOUT CONTRAST:
TECHNIQUE: Contiguous axial images were obtained from the base of the skull through the vertex according to standard protocol without contrast.

[Series 2: head_seq 4.5 h45s st · axial · 0.43mm/px · z∈[+1046,+1190]mm · 16 of 36 slices shown, 20 images]
[im 2/36  brain]
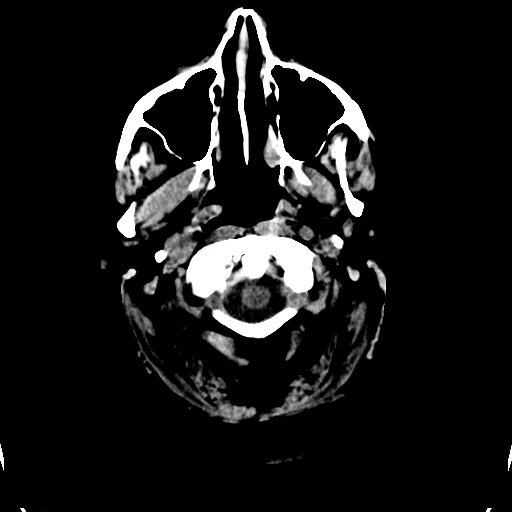
[im 2/36  bone]
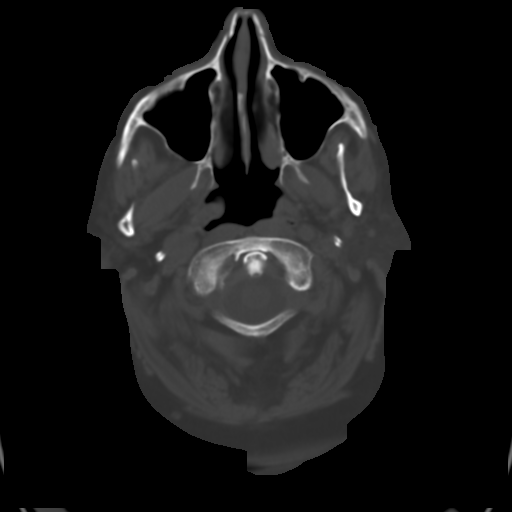
[im 4/36  brain]
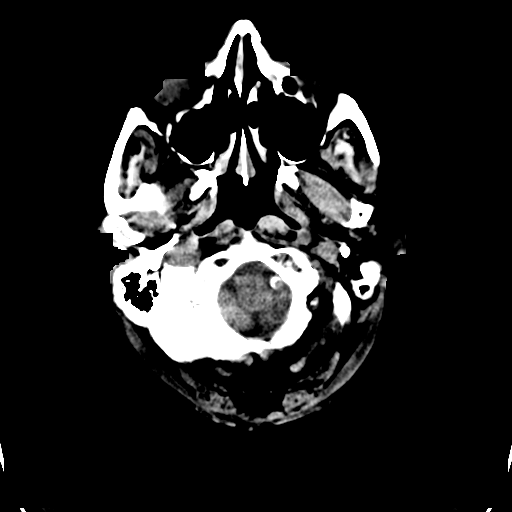
[im 7/36  brain]
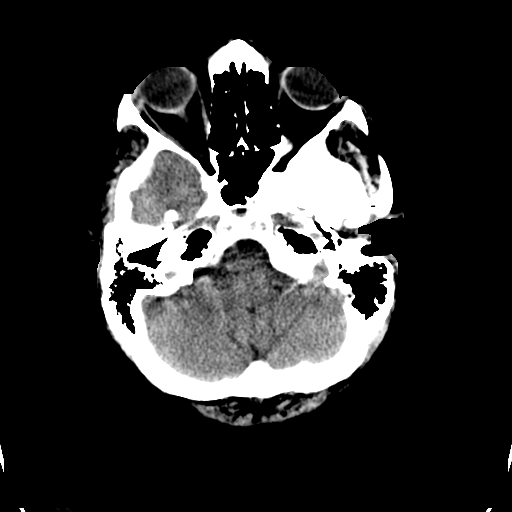
[im 9/36  brain]
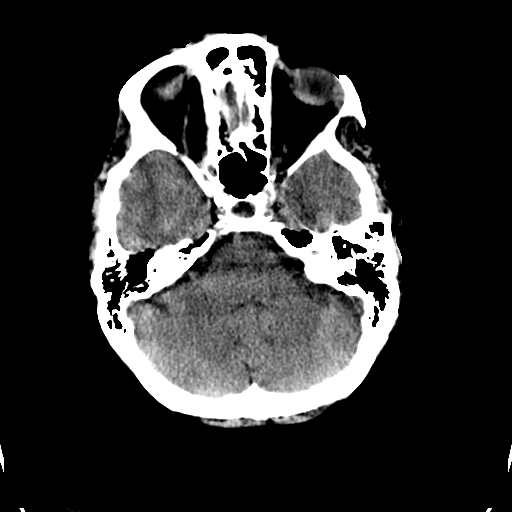
[im 10/36  brain]
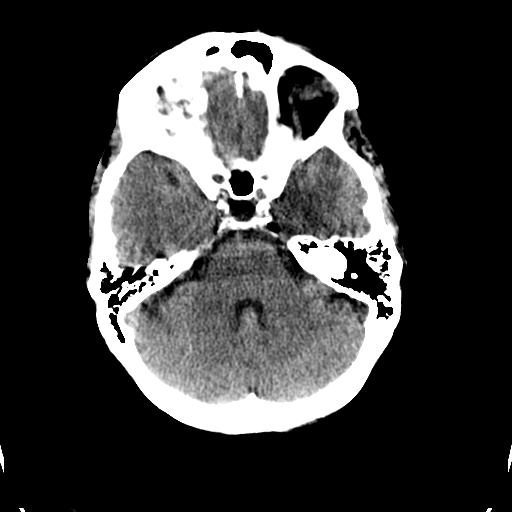
[im 10/36  bone]
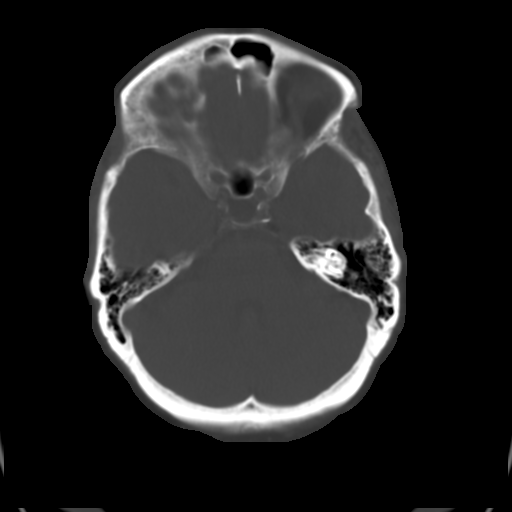
[im 13/36  brain]
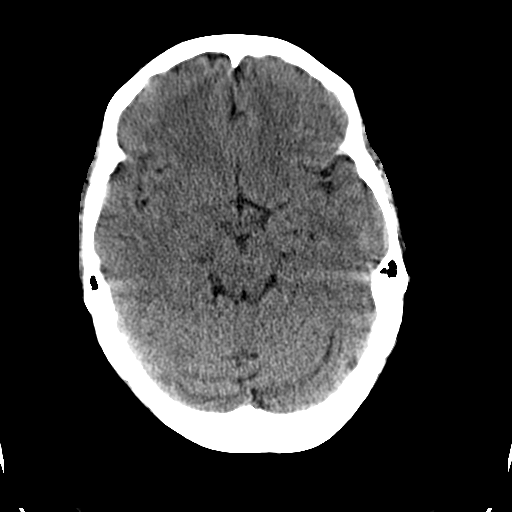
[im 15/36  brain]
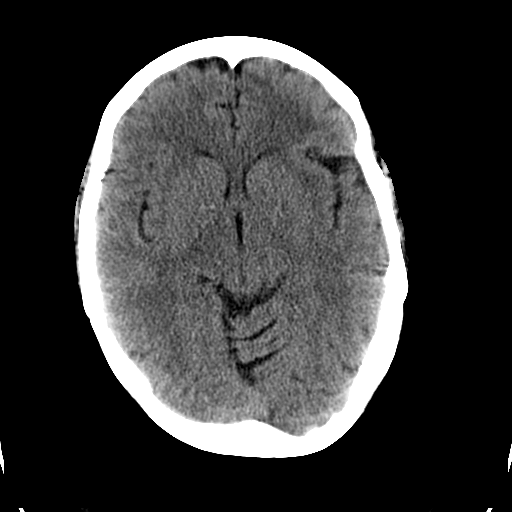
[im 17/36  brain]
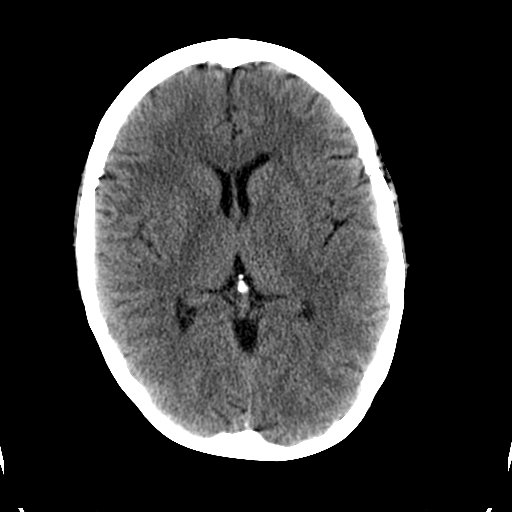
[im 19/36  brain]
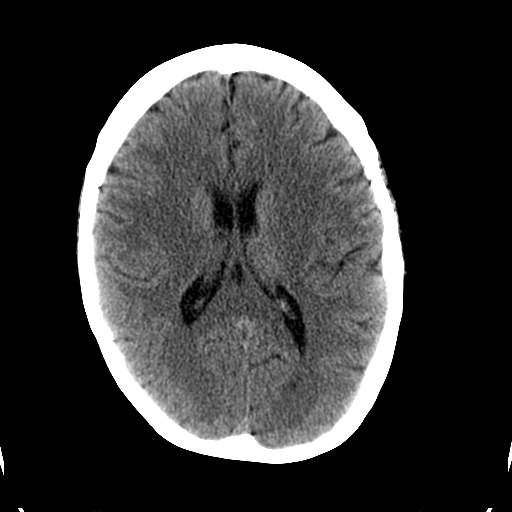
[im 19/36  bone]
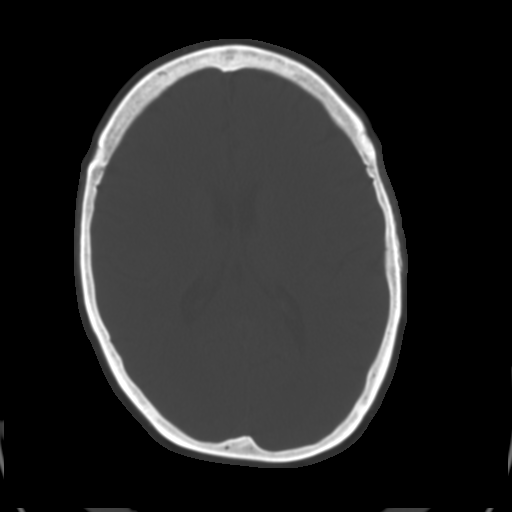
[im 21/36  brain]
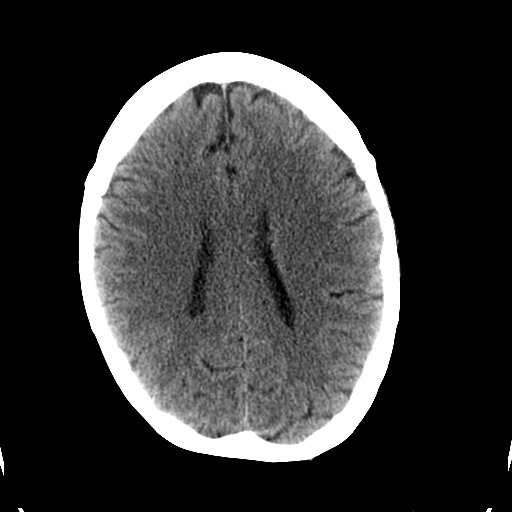
[im 23/36  brain]
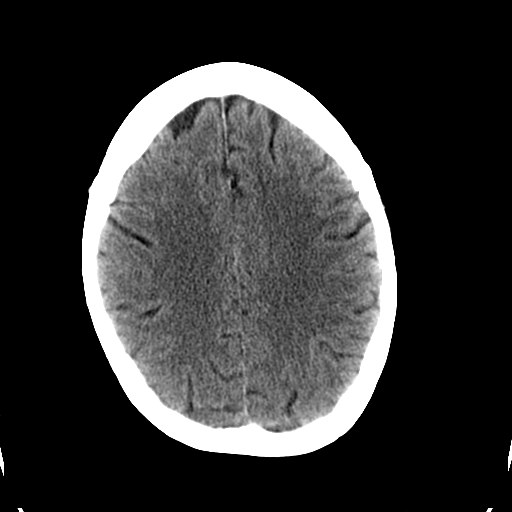
[im 26/36  brain]
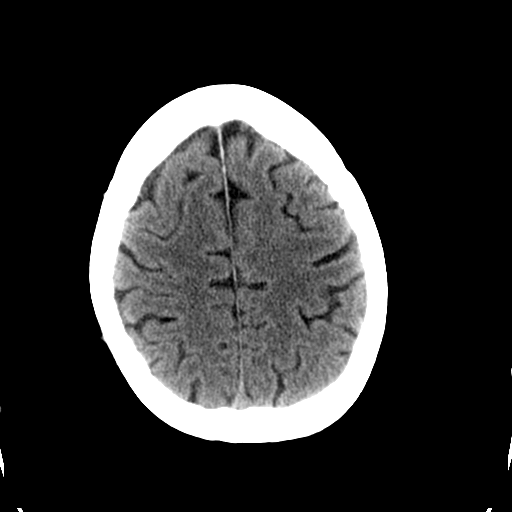
[im 27/36  brain]
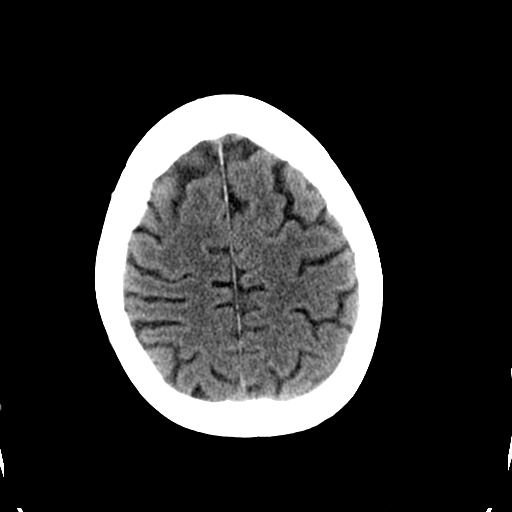
[im 27/36  bone]
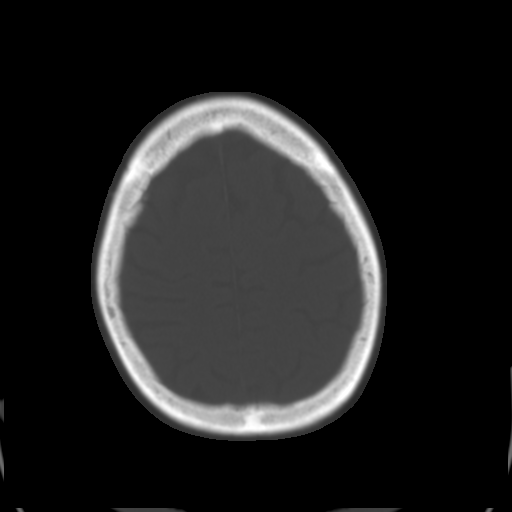
[im 29/36  brain]
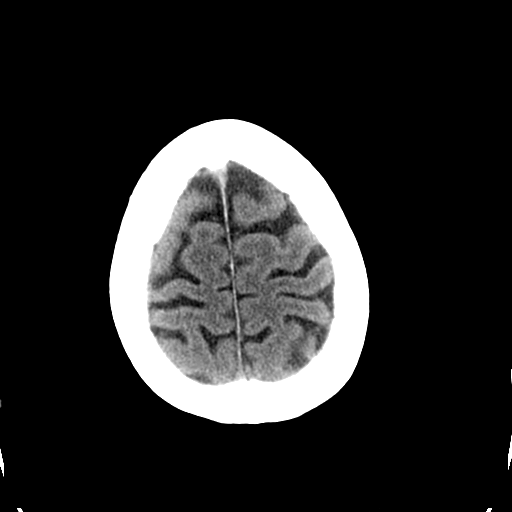
[im 32/36  brain]
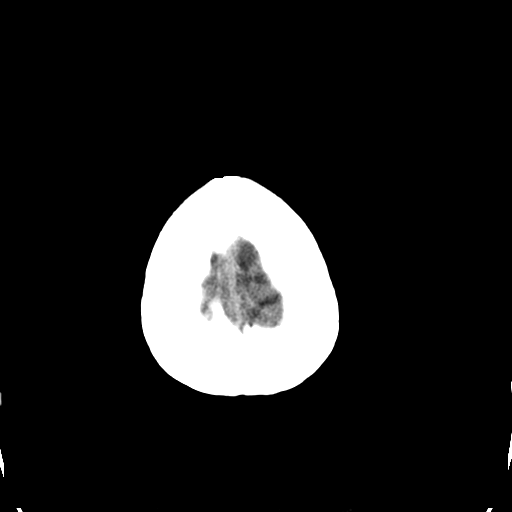
[im 34/36  brain]
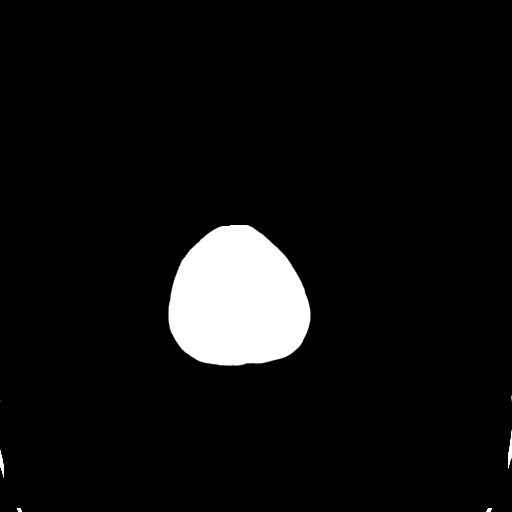

[16 of 30 positions shown; findings below may reference images not displayed]

FINDINGS: Ventricular system is unchanged in size and configuration and the septum remains midline in position.  No acute intracranial abnormality is seen.  No blood, edema or mass effect is noted.  No bony abnormality is seen.
IMPRESSION: No acute intracranial abnormality.

## 2007-01-27 ENCOUNTER — Emergency Department (HOSPITAL_COMMUNITY): Admission: EM | Admit: 2007-01-27 | Discharge: 2007-01-27 | Payer: Self-pay | Admitting: Emergency Medicine

## 2007-01-28 ENCOUNTER — Emergency Department (HOSPITAL_COMMUNITY): Admission: EM | Admit: 2007-01-28 | Discharge: 2007-01-28 | Payer: Self-pay | Admitting: Emergency Medicine

## 2007-01-29 ENCOUNTER — Emergency Department (HOSPITAL_COMMUNITY): Admission: EM | Admit: 2007-01-29 | Discharge: 2007-01-29 | Payer: Self-pay | Admitting: Emergency Medicine

## 2007-01-31 ENCOUNTER — Ambulatory Visit: Payer: Self-pay | Admitting: Family Medicine

## 2007-02-02 ENCOUNTER — Emergency Department (HOSPITAL_COMMUNITY): Admission: EM | Admit: 2007-02-02 | Discharge: 2007-02-02 | Payer: Self-pay | Admitting: Emergency Medicine

## 2007-02-03 ENCOUNTER — Emergency Department (HOSPITAL_COMMUNITY): Admission: EM | Admit: 2007-02-03 | Discharge: 2007-02-04 | Payer: Self-pay | Admitting: Emergency Medicine

## 2007-02-05 ENCOUNTER — Emergency Department (HOSPITAL_COMMUNITY): Admission: EM | Admit: 2007-02-05 | Discharge: 2007-02-05 | Payer: Self-pay | Admitting: Emergency Medicine

## 2007-02-07 ENCOUNTER — Emergency Department (HOSPITAL_COMMUNITY): Admission: EM | Admit: 2007-02-07 | Discharge: 2007-02-07 | Payer: Self-pay | Admitting: Emergency Medicine

## 2007-02-07 IMAGING — CR DG RIBS W/ CHEST 3+V*L*
3 series · 3 of 3 positions shown · non-contrast
Comparison: none

CLINICAL DATA: Fall with left upper rib pain

LEFT RIBS - 3 VIEW

[w chest pa]
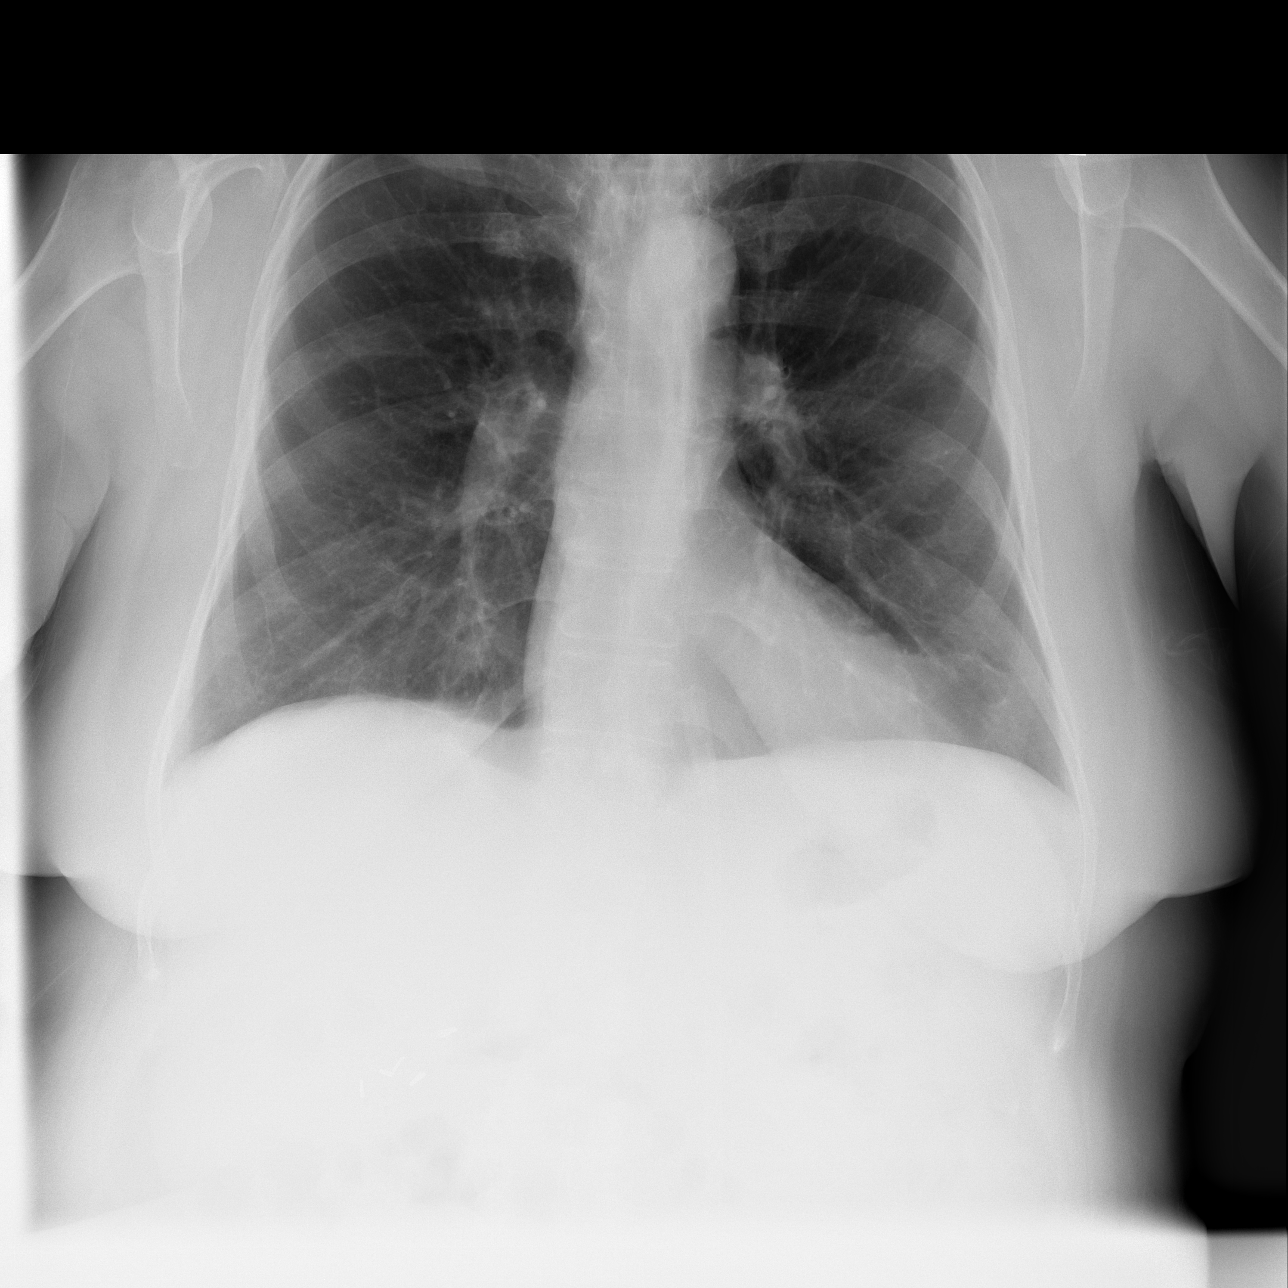

[w ribs ap/pa upper left]
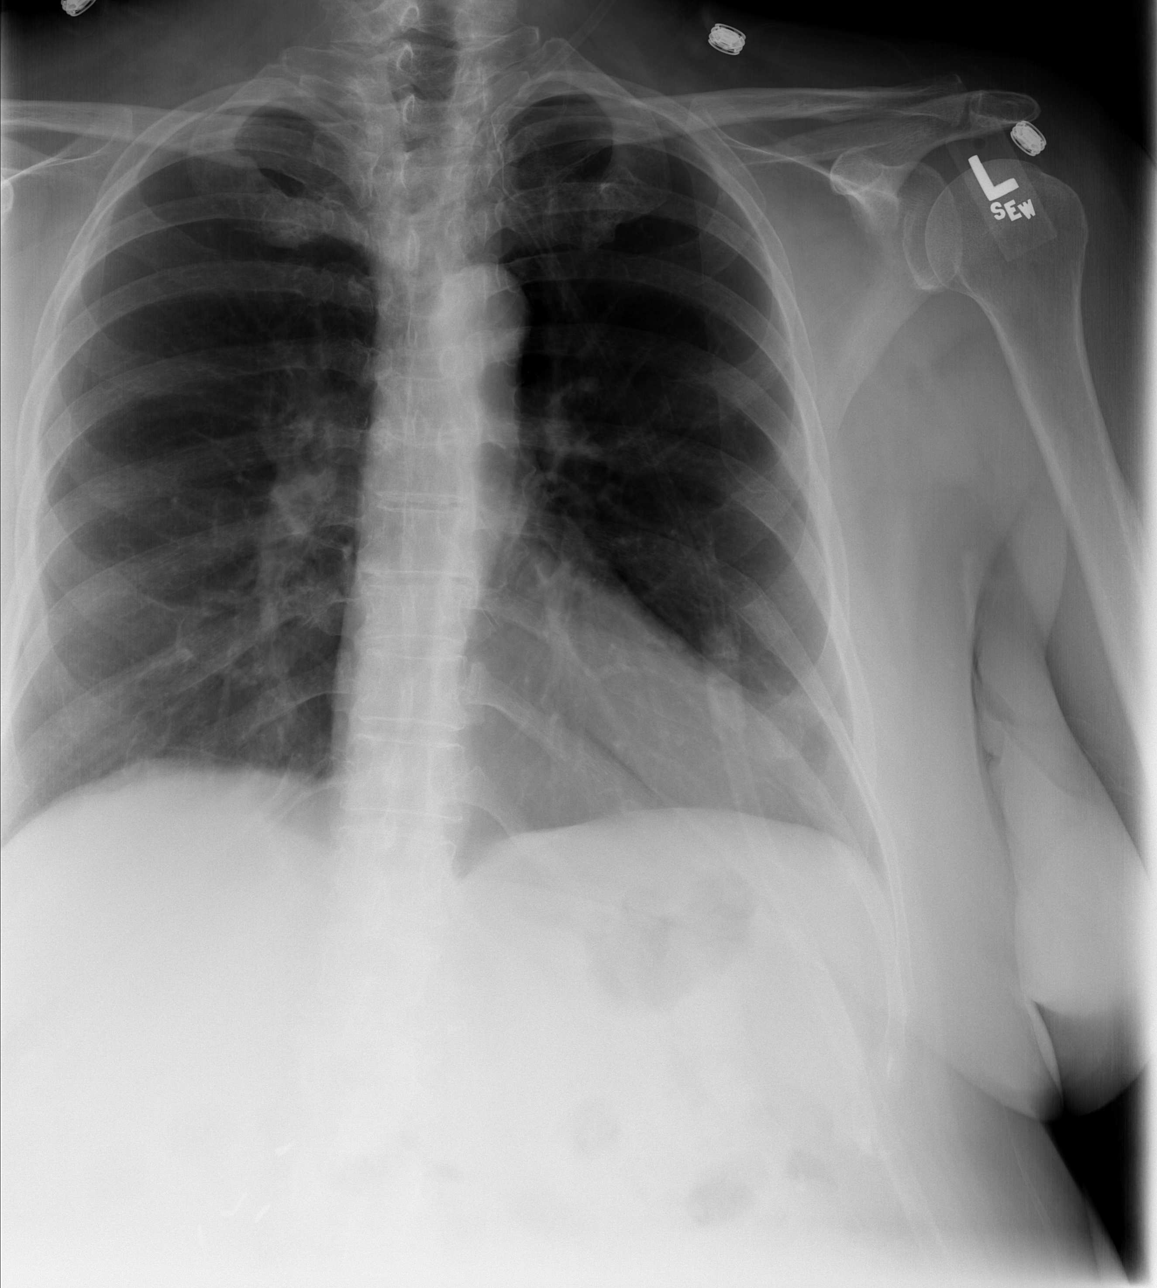

[w ribs oblique left]
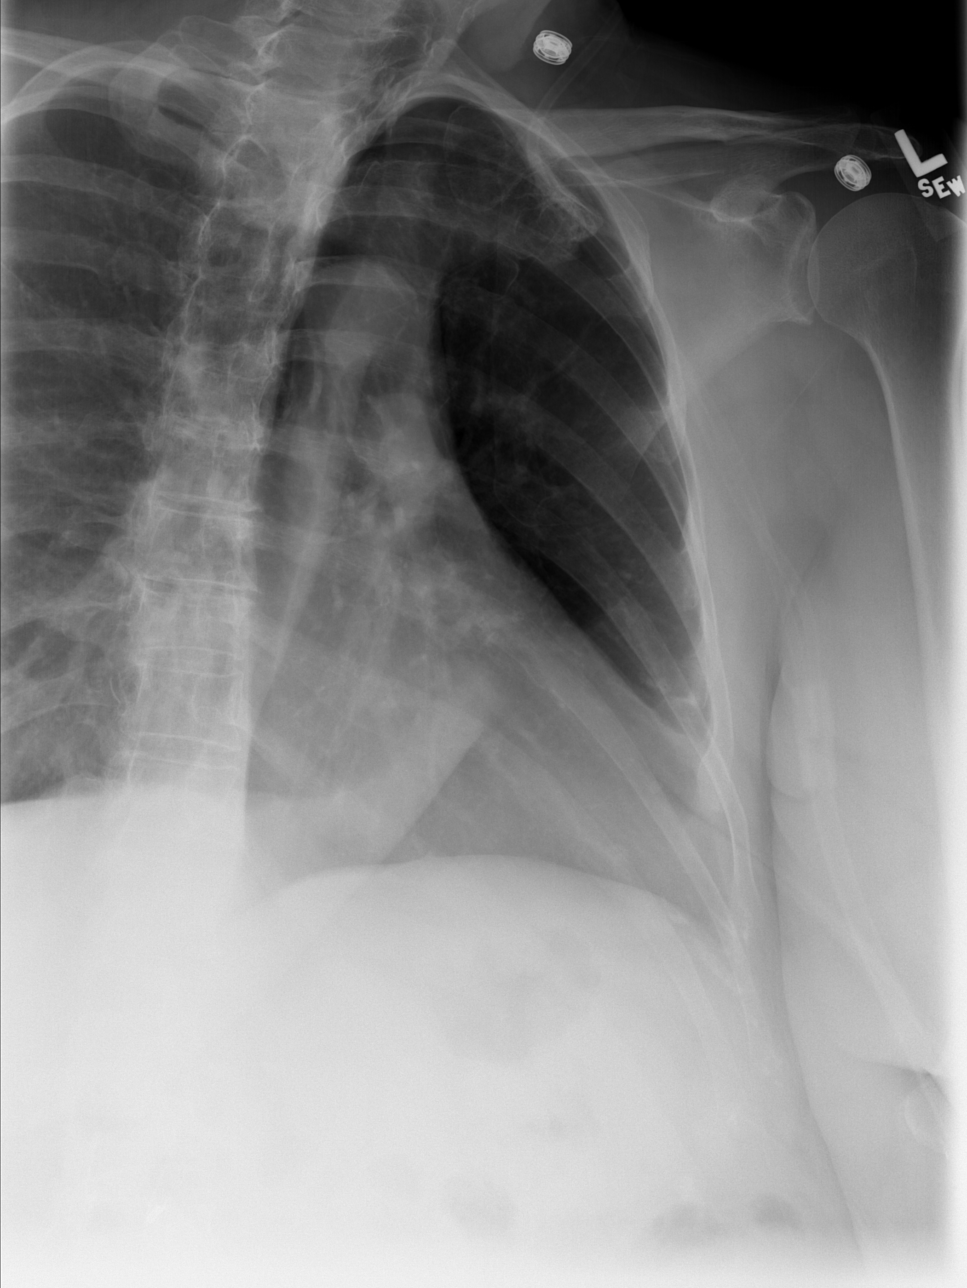

[3 of 3 positions shown; findings below may reference images not displayed]

FINDINGS: No pneumothorax is identified. No pleural effusion noted. Heart size
is within normal limits. No definite acute rib fractures identified. 

IMPRESSION

No pneumothorax or pleural effusion identified. No definite rib fracture noted. 
Nondisplaced rib fractures can be occult on conventional radiography.

## 2007-02-07 IMAGING — CR DG LUMBAR SPINE COMPLETE 4+V
5 series · 5 of 5 positions shown · non-contrast
Comparison: 11/26/06.

CLINICAL DATA: Patient status post fall with back pain.  
 LUMBAR SPINE ? 4 VIEW:

[t l-spine a.p.]
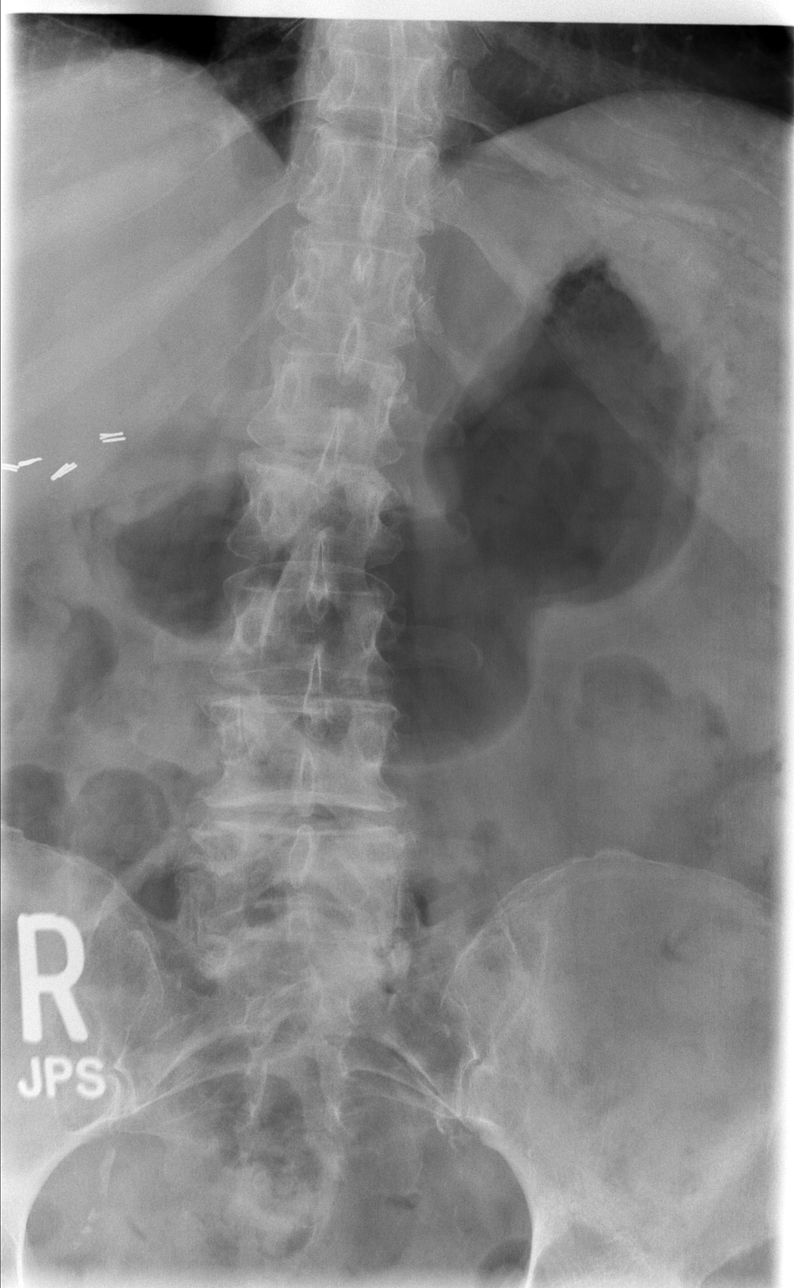

[t l-spine oblique exposure (1 of 2)]
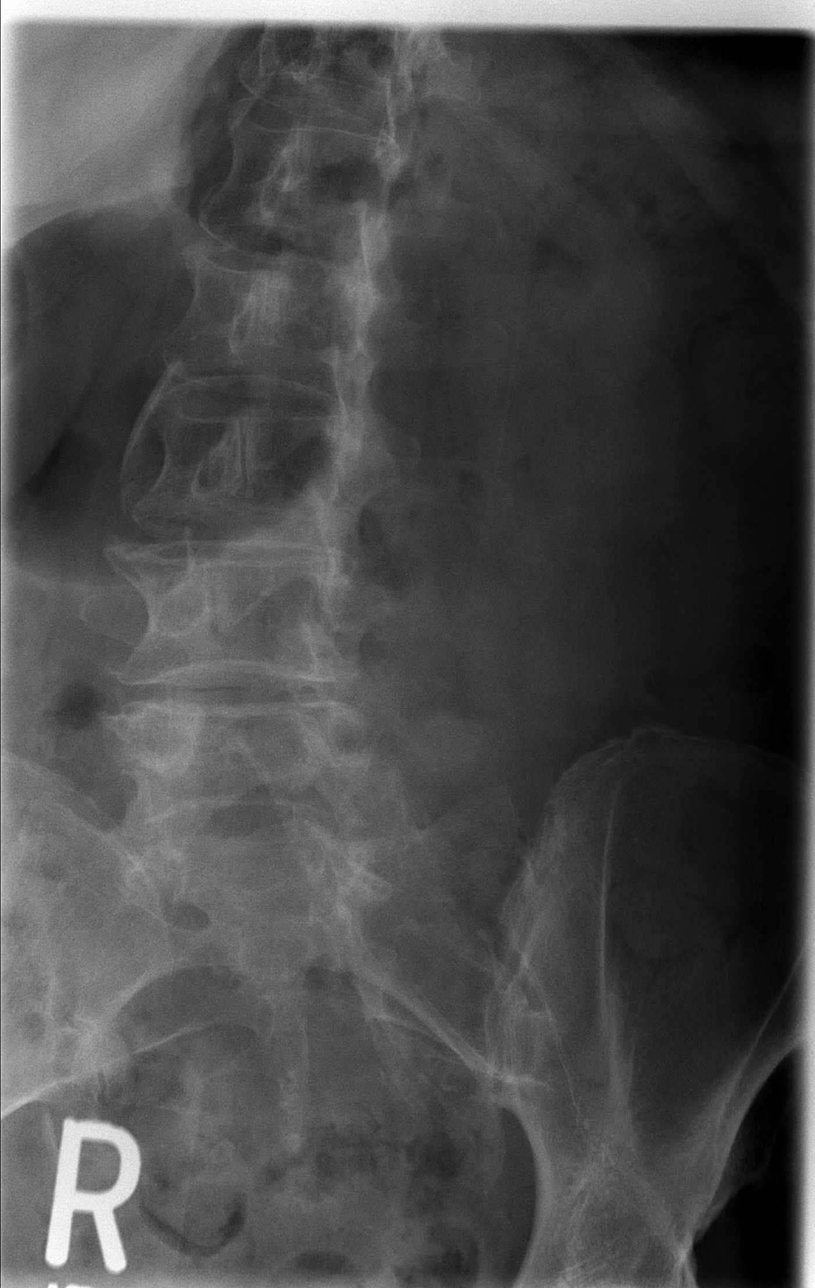

[t l-spine oblique exposure (2 of 2)]
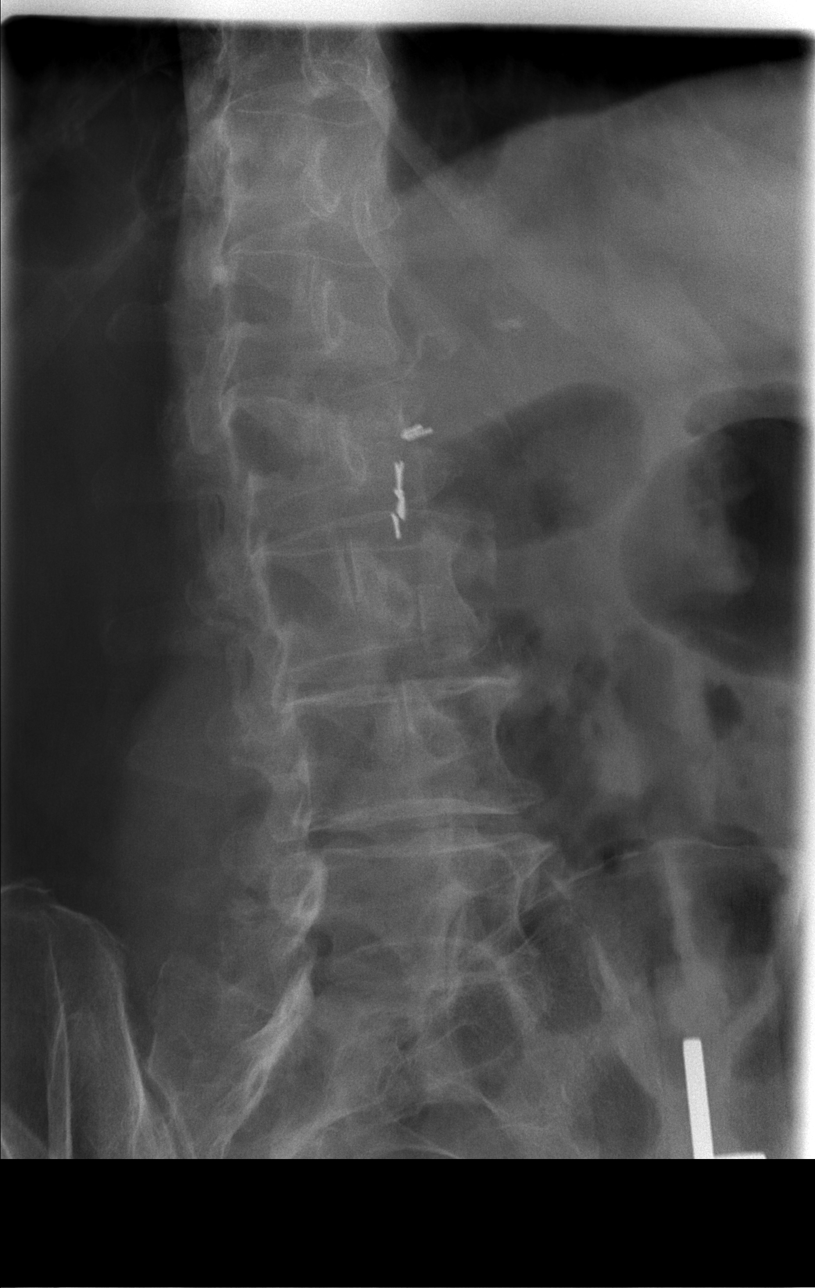

[t l-spine lat]
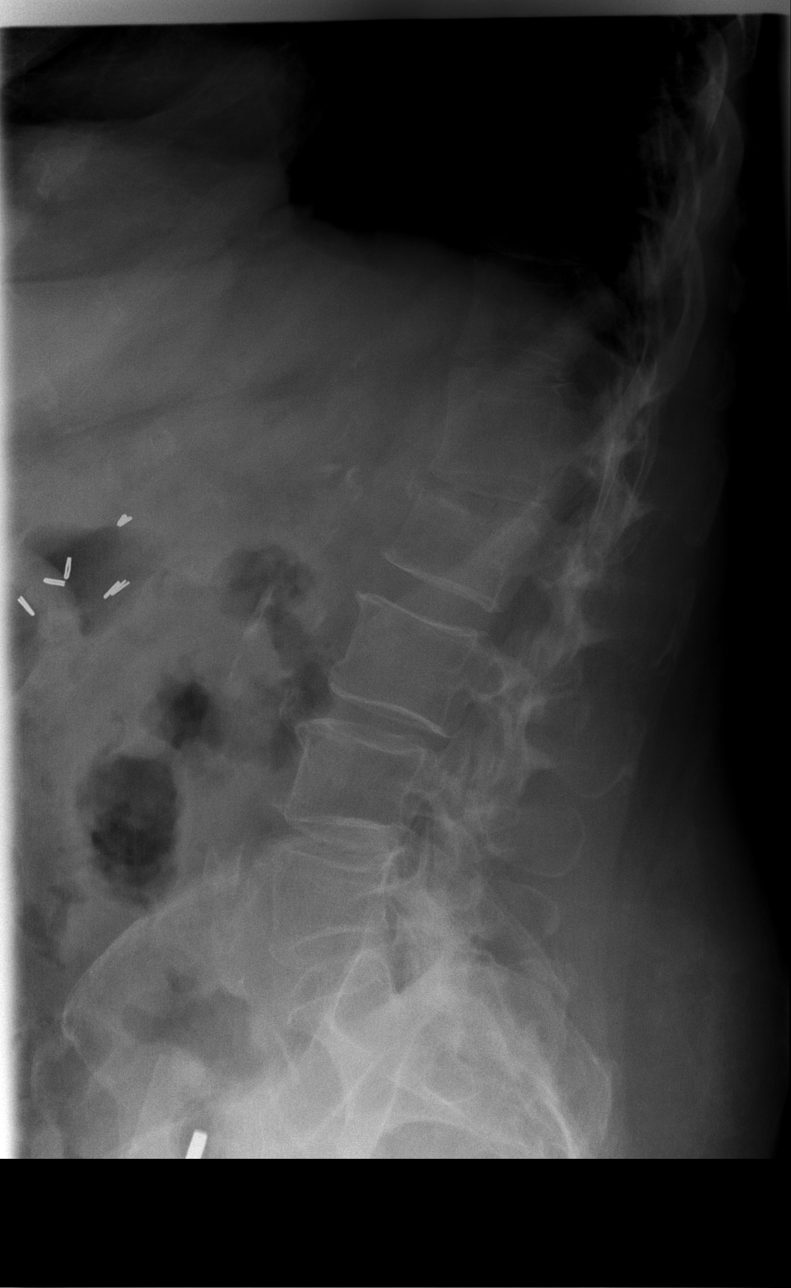

[t l-spine l5-s1 spot]
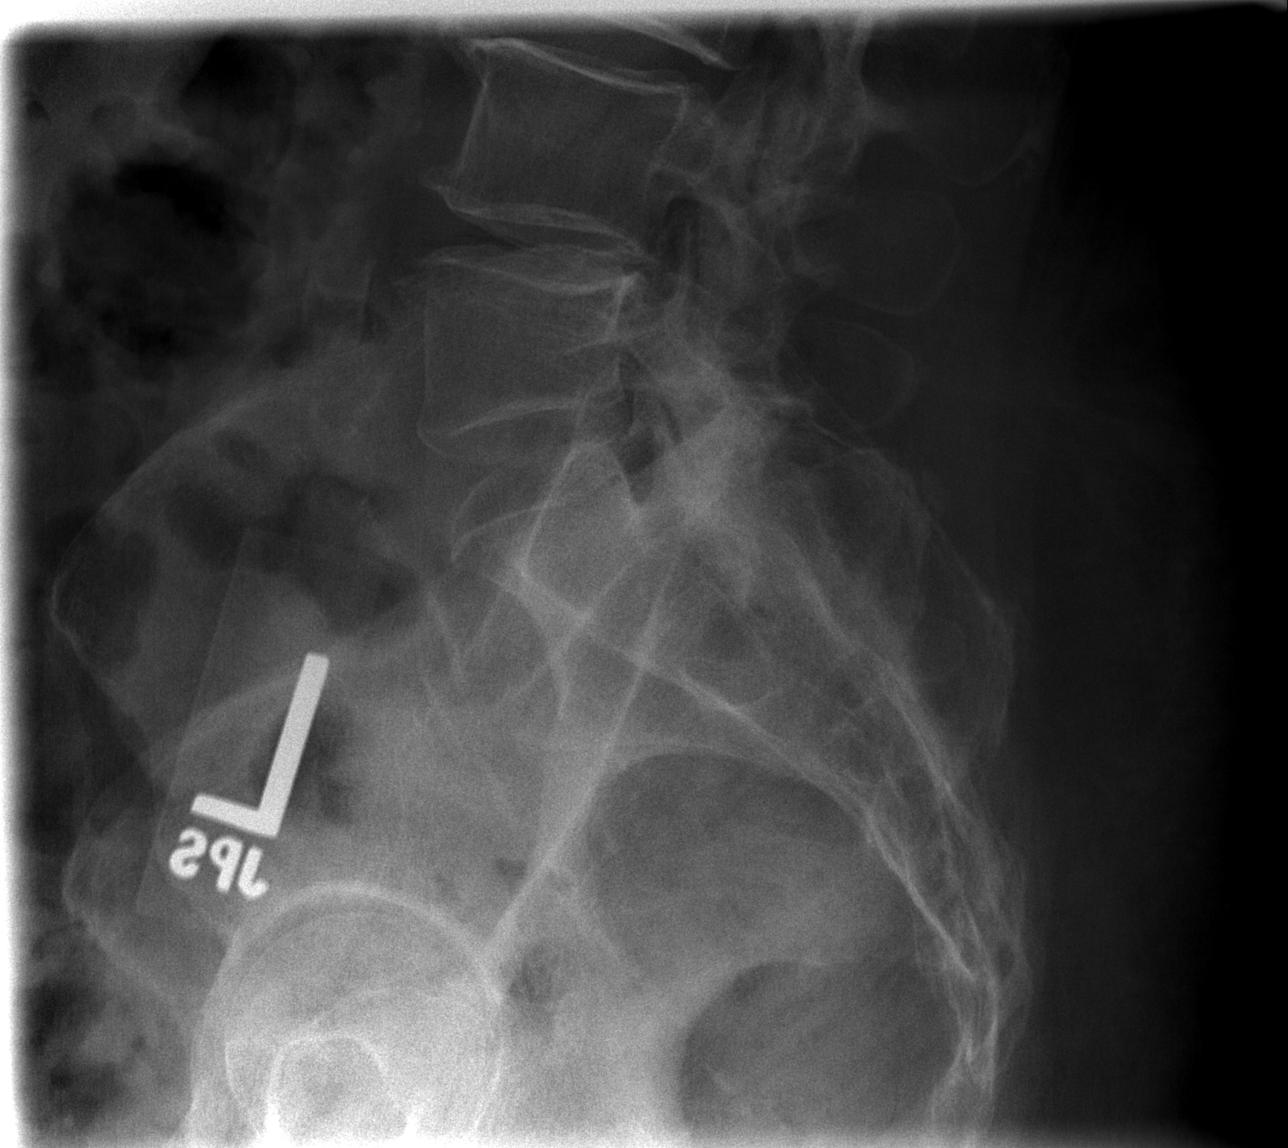

[5 of 5 positions shown; findings below may reference images not displayed]

FINDINGS: Again seen is a superior end plate compression fracture at L2.  There is also a superior end plate concavity of L5 which is unchanged.  Loss of disc space height is noted at L4-5.  There is facet arthropathy in the lower lumbar spine.
IMPRESSION: No acute finding with superior end plate compression fractures L2 and L5.  No new abnormality.

## 2007-02-08 ENCOUNTER — Emergency Department (HOSPITAL_COMMUNITY): Admission: EM | Admit: 2007-02-08 | Discharge: 2007-02-08 | Payer: Self-pay | Admitting: Emergency Medicine

## 2007-02-10 ENCOUNTER — Ambulatory Visit: Payer: Self-pay | Admitting: Family Medicine

## 2007-02-11 ENCOUNTER — Emergency Department (HOSPITAL_COMMUNITY): Admission: EM | Admit: 2007-02-11 | Discharge: 2007-02-11 | Payer: Self-pay | Admitting: *Deleted

## 2007-02-11 IMAGING — CR DG CERVICAL SPINE COMPLETE 4+V
6 series · 6 of 6 positions shown · non-contrast
Comparison: none

CLINICAL DATA: Neck stiffness and back pain after a fall two days ago.       
 CERVICAL SPINE - 6 VIEW:

[w c-spine lat]
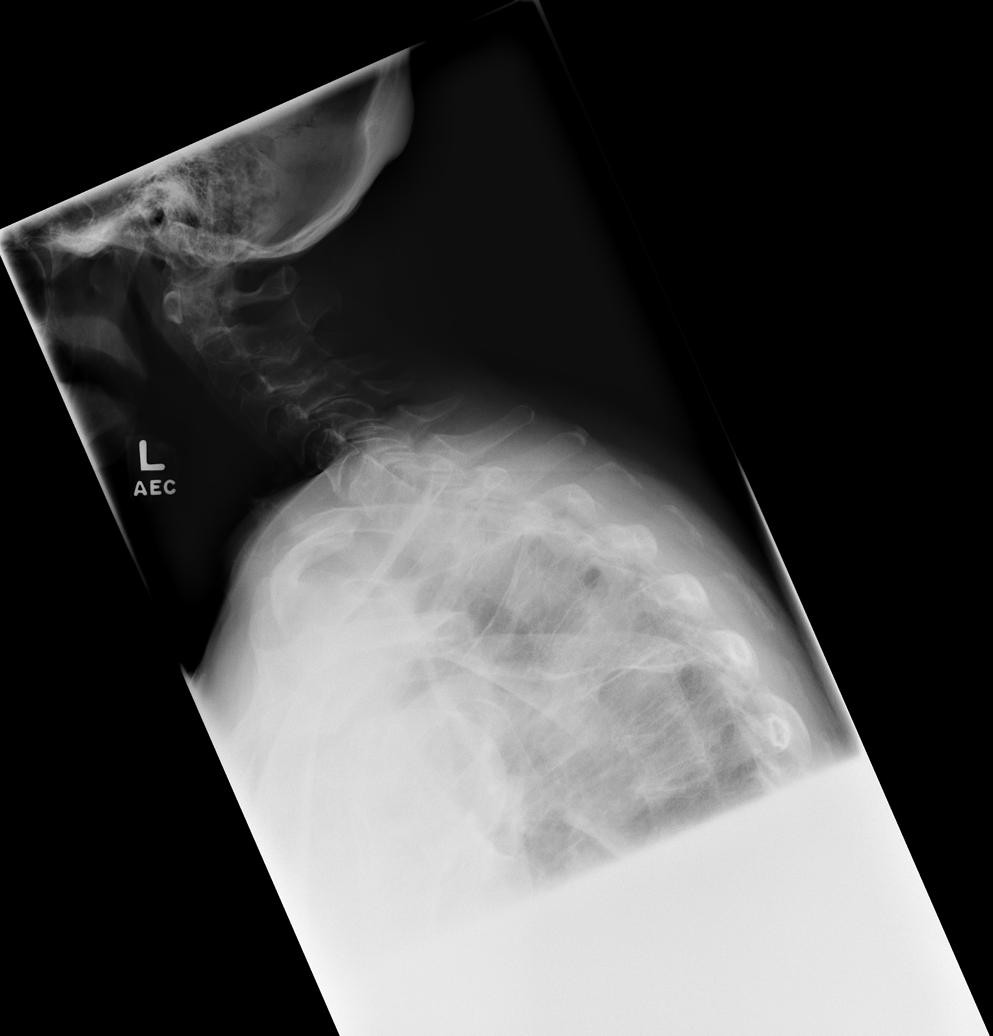

[w c-spine oblique (1 of 2)]
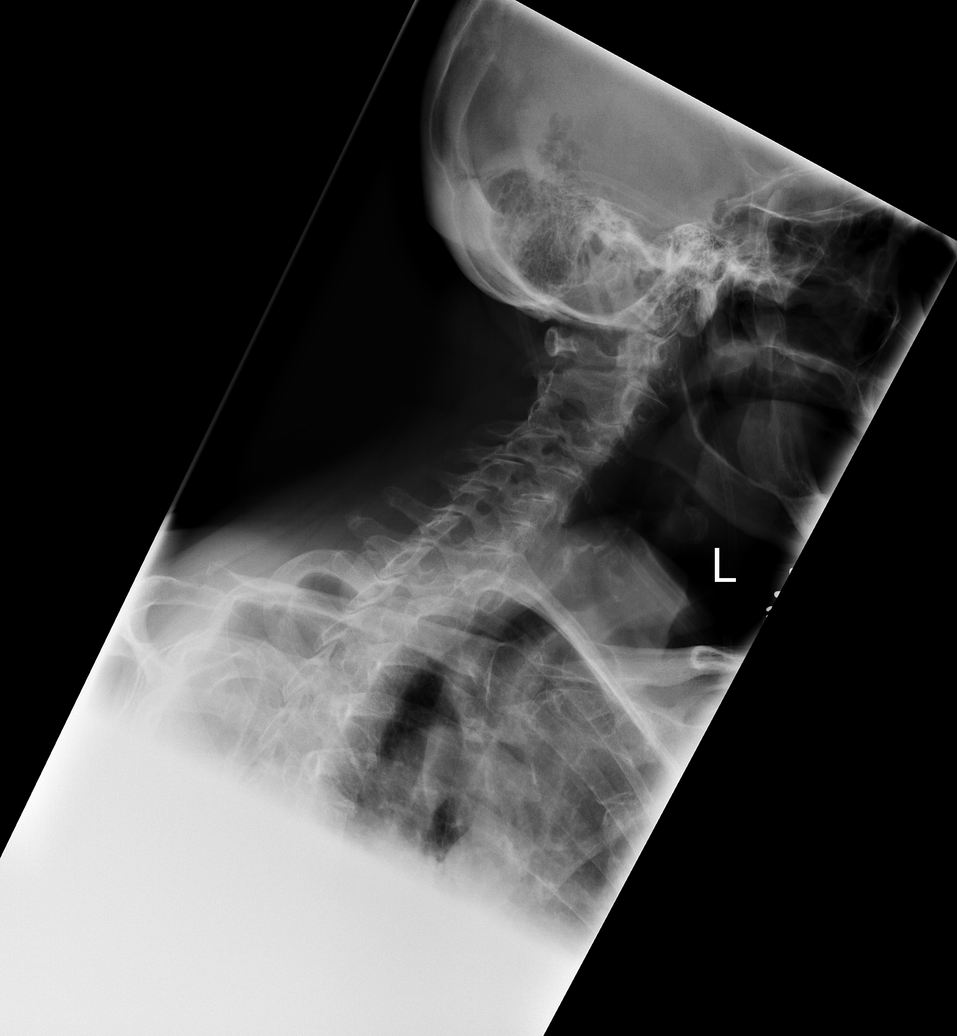

[w c-spine oblique (2 of 2)]
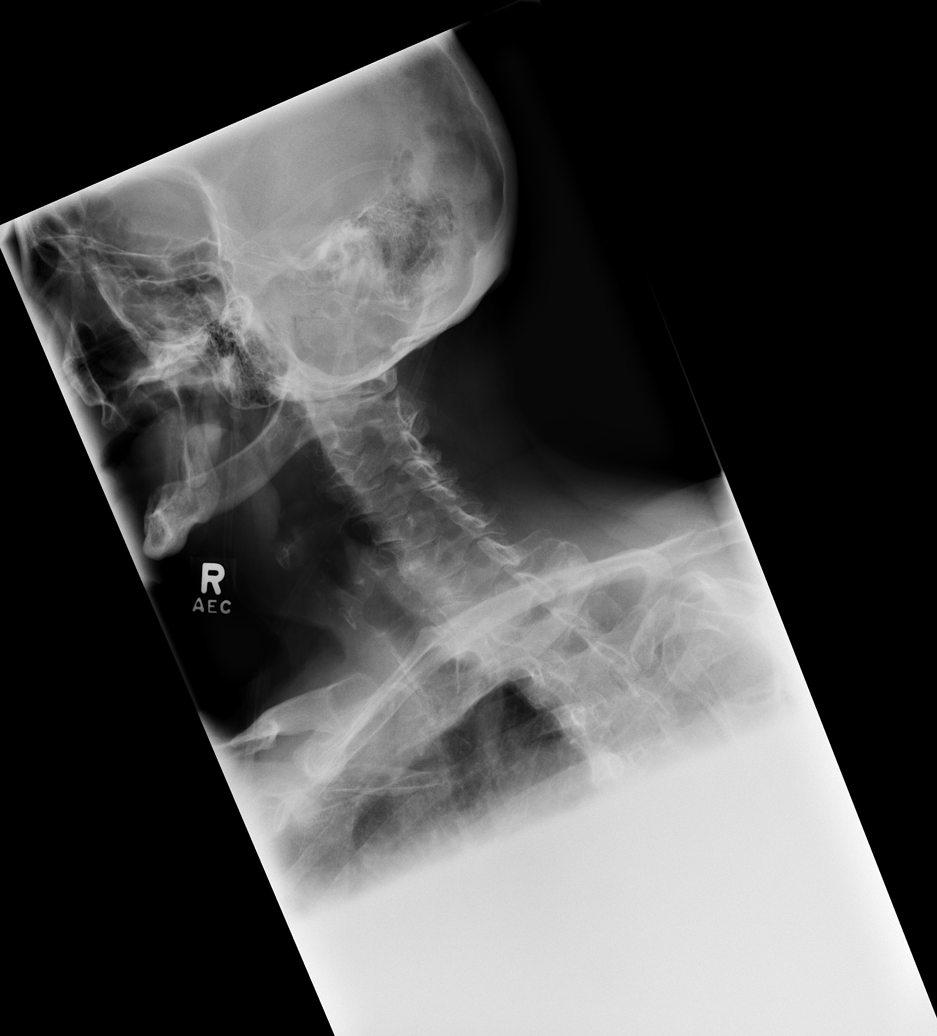

[w c-spine a.p.]
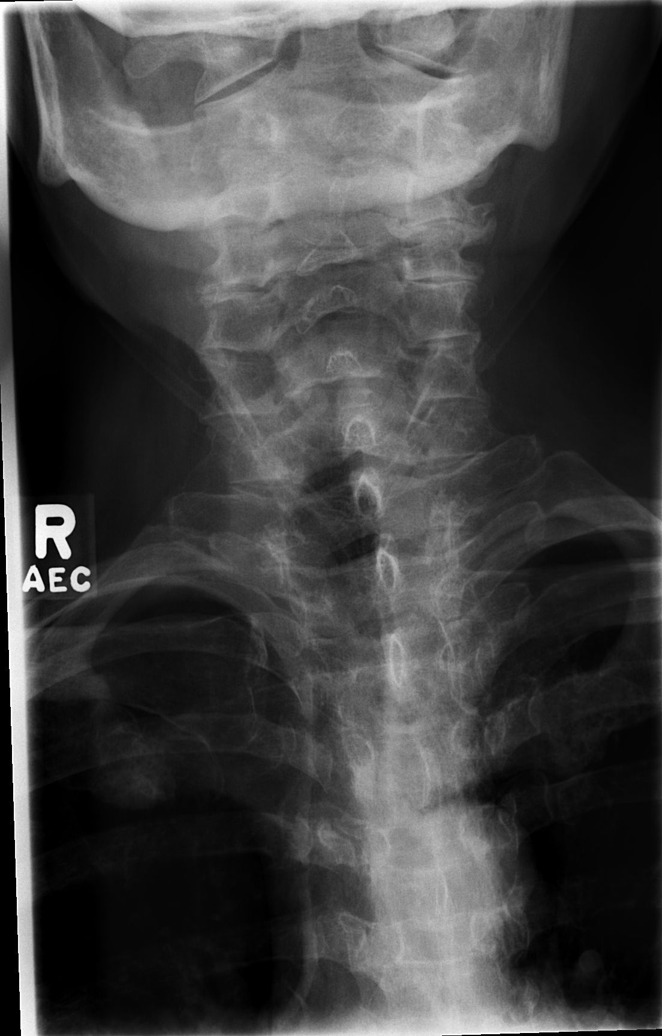

[w c-spine odontoid]
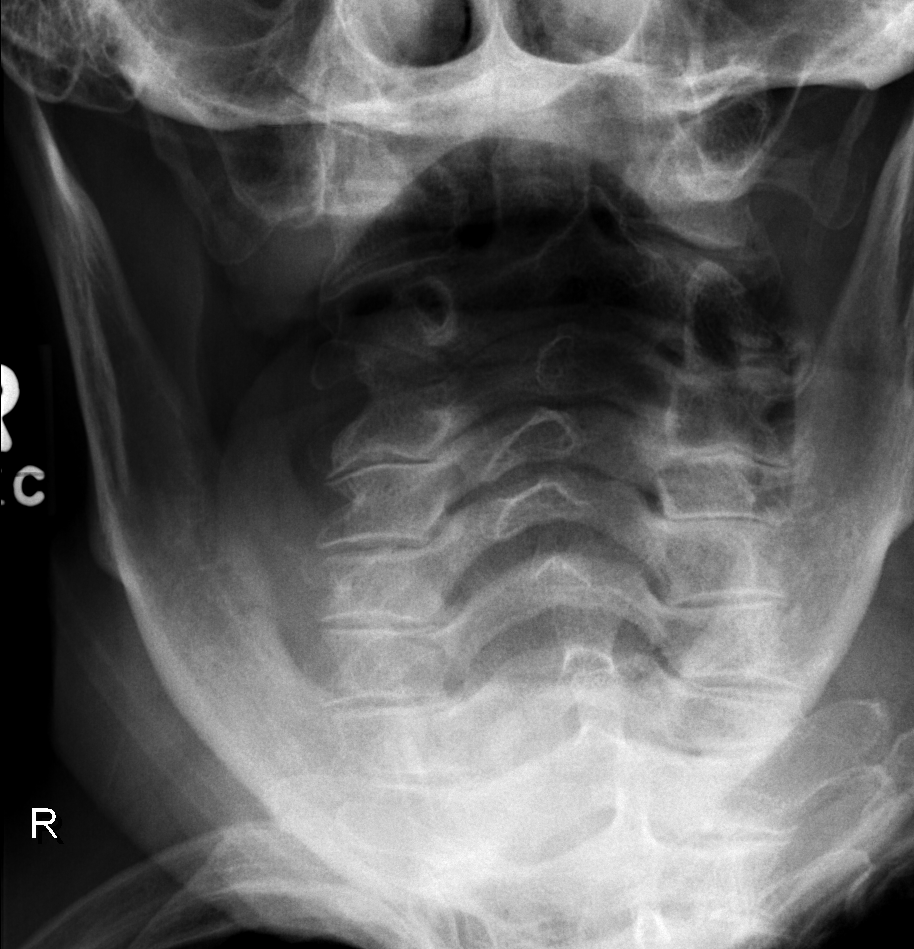

[w swimmers view *]
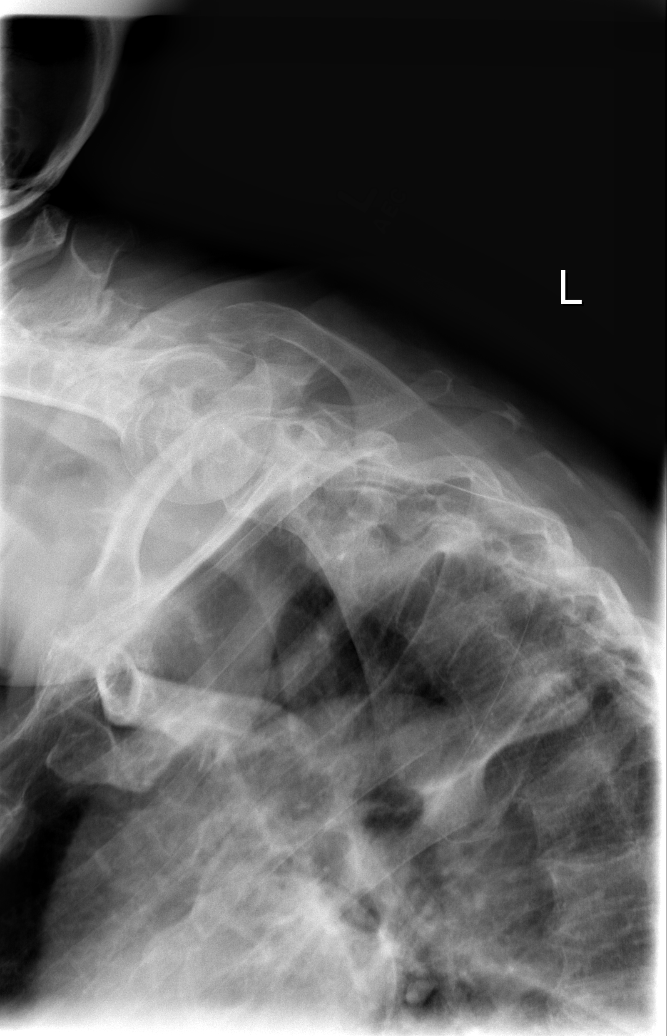

[6 of 6 positions shown; findings below may reference images not displayed]

FINDINGS: Osteopenia makes evaluation for subtle fracture difficult.  The dens is obscured on the dedicated view.  Cervicothoracic junction appears aligned although the individual vertebral bodies are not well evaluated.  Alignment is otherwise anatomic.  There is facet sclerosis.  Mild endplate degenerative changes are seen in the lower cervical spine.
IMPRESSION: 1.  Osteopenia makes evaluation for subtle fracture difficult. Please see comments regarding cervicothoracic junction above. 
 2.  Spondylosis.

## 2007-02-11 IMAGING — CR DG RIBS W/ CHEST 3+V*R*
4 series · 4 of 4 positions shown · non-contrast
Comparison: 01/05/2007

CLINICAL DATA: 65-year-old female, neck stiffness, back pain.  Status post fall 2 days again.          
 RIGHT UNILATERAL RIB FILMS AND 1 VIEW CHEST ? 01/09/2007:

[w chest pa]
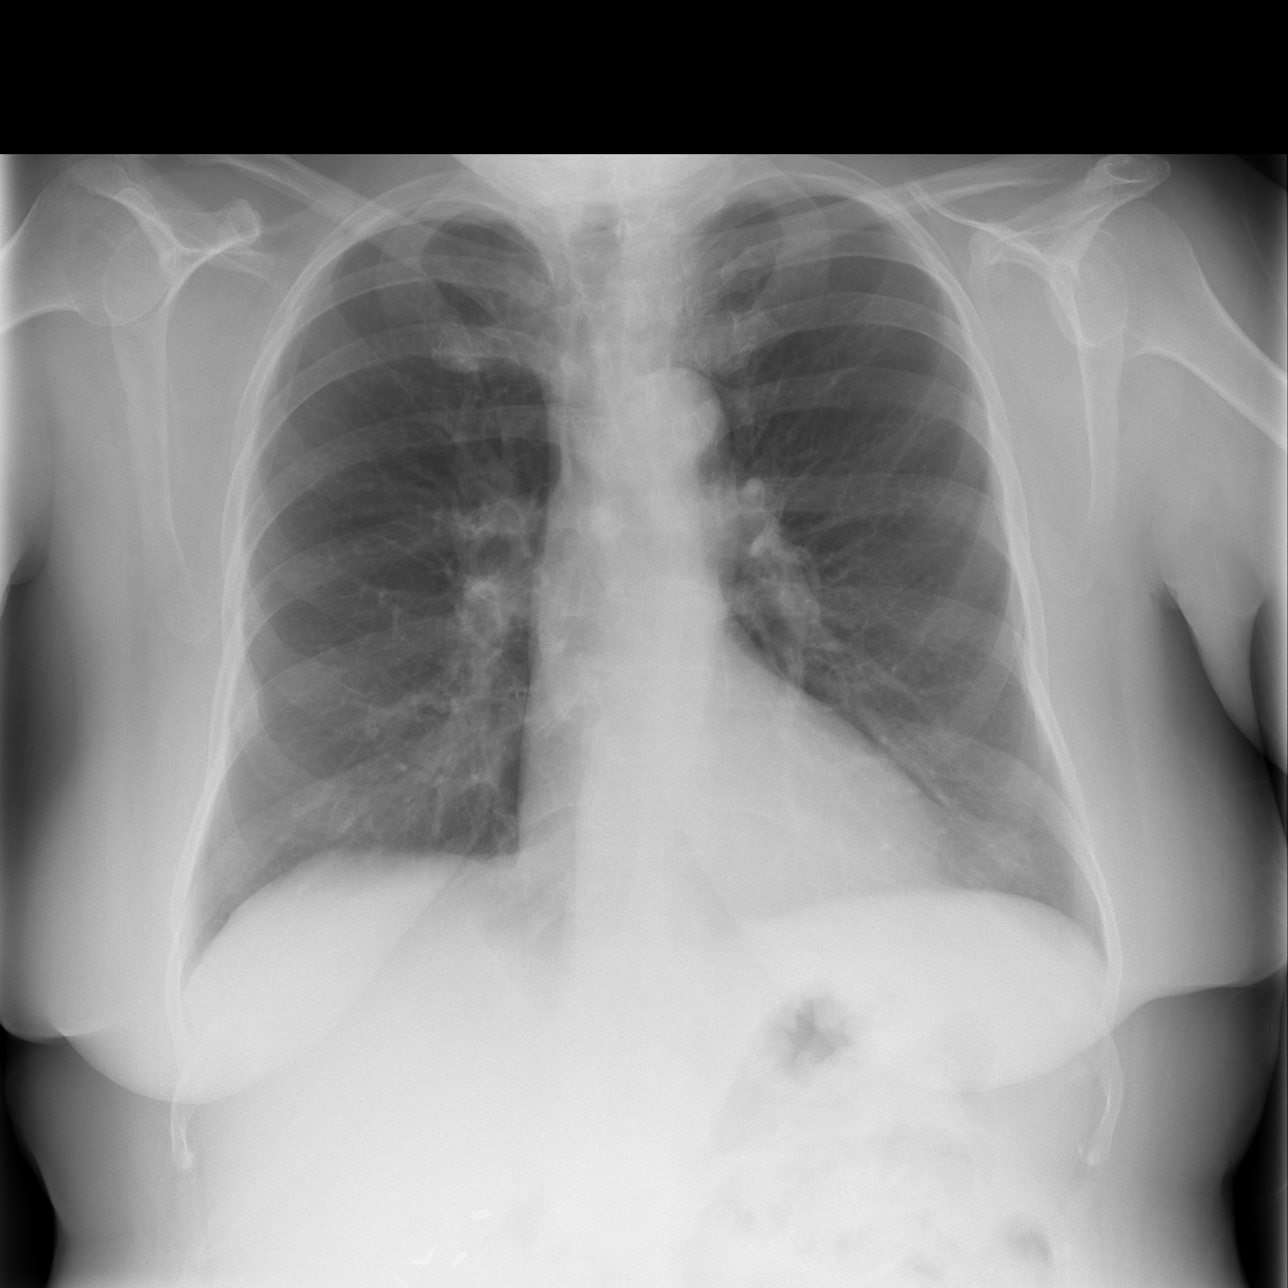

[w ribs ap/pa upper right]
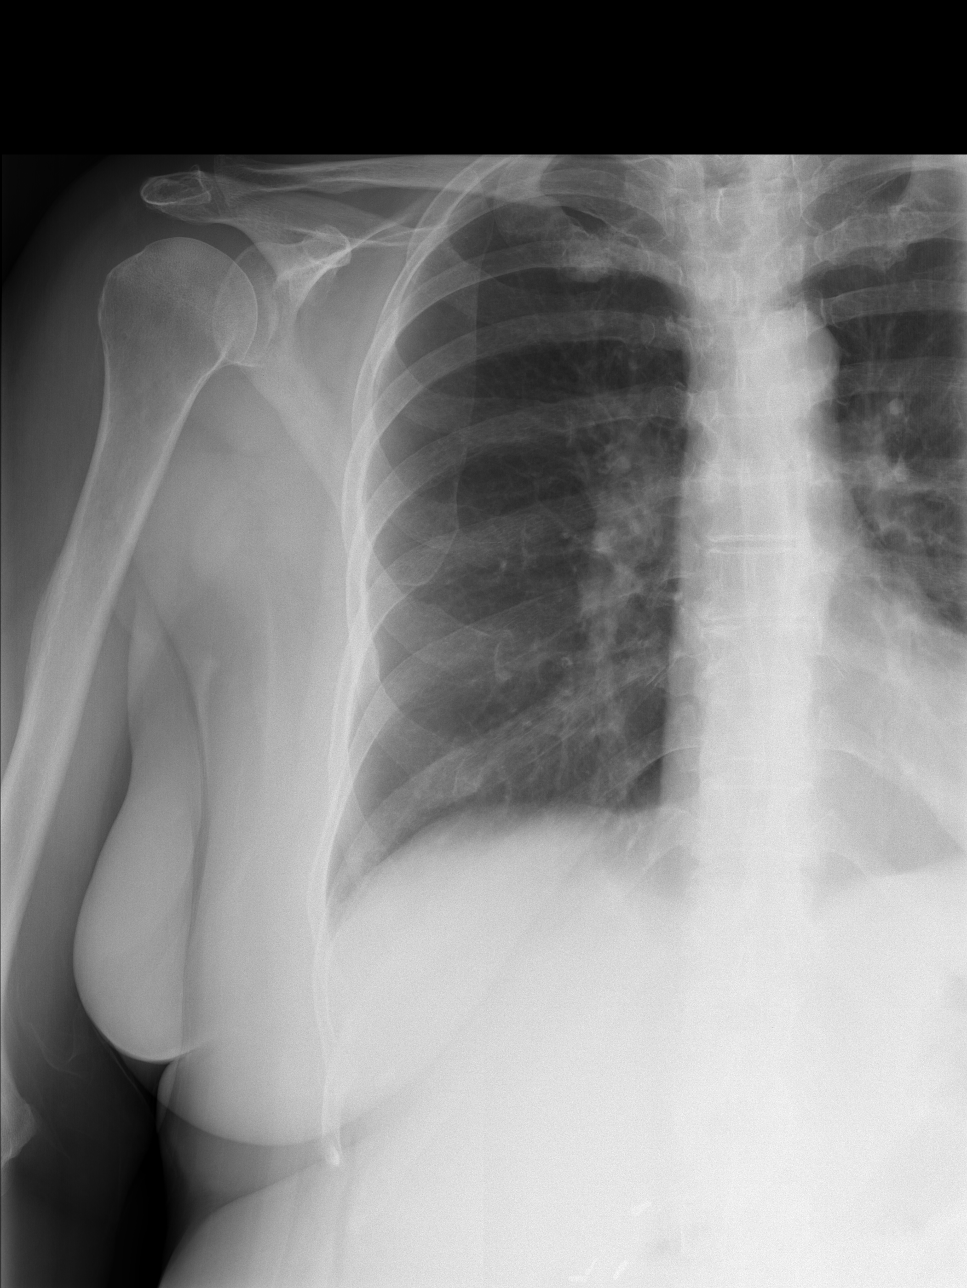

[w ribs ap/pa lower right]
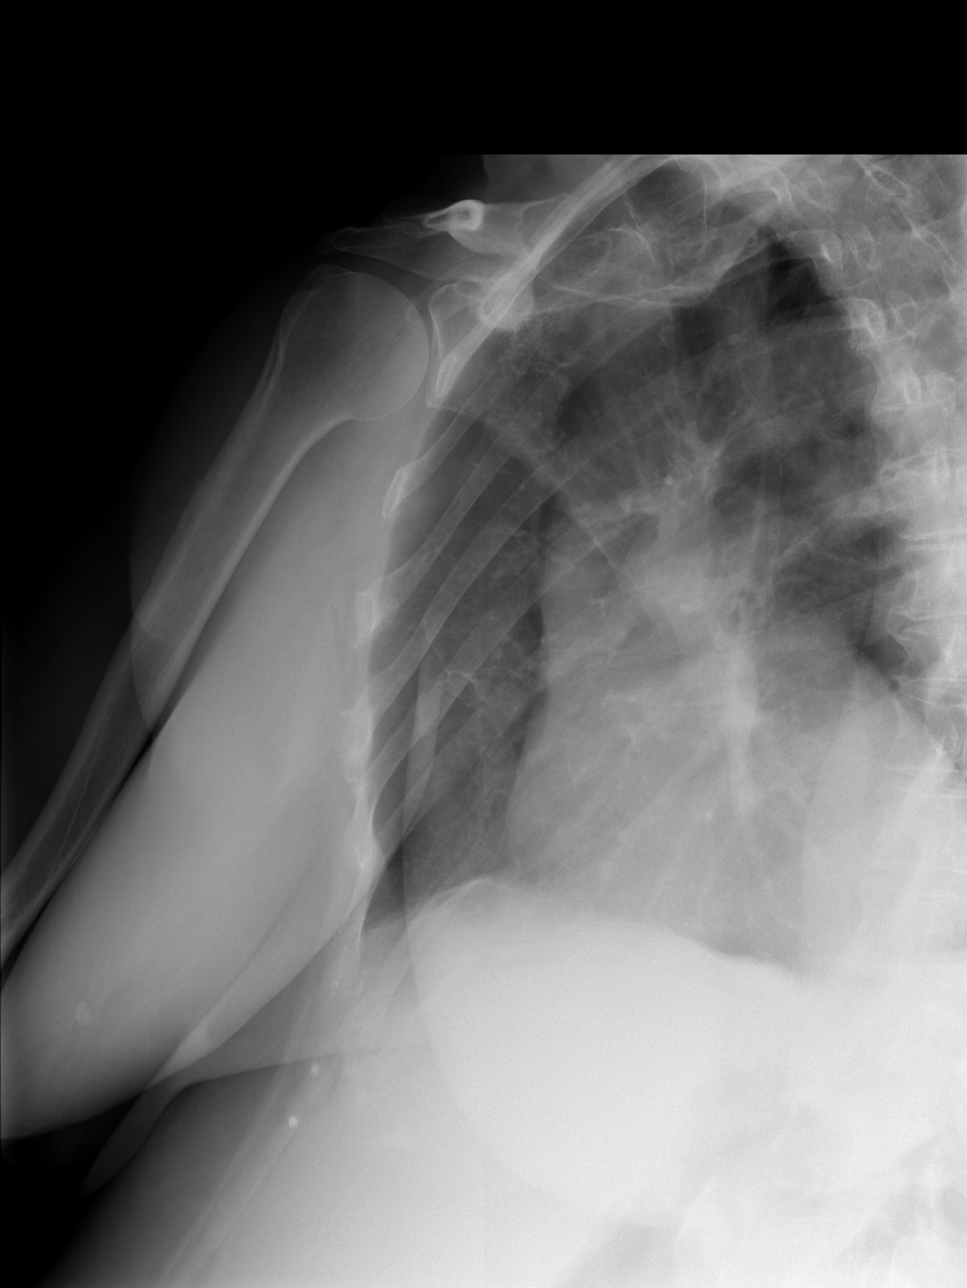

[w ribs oblique right]
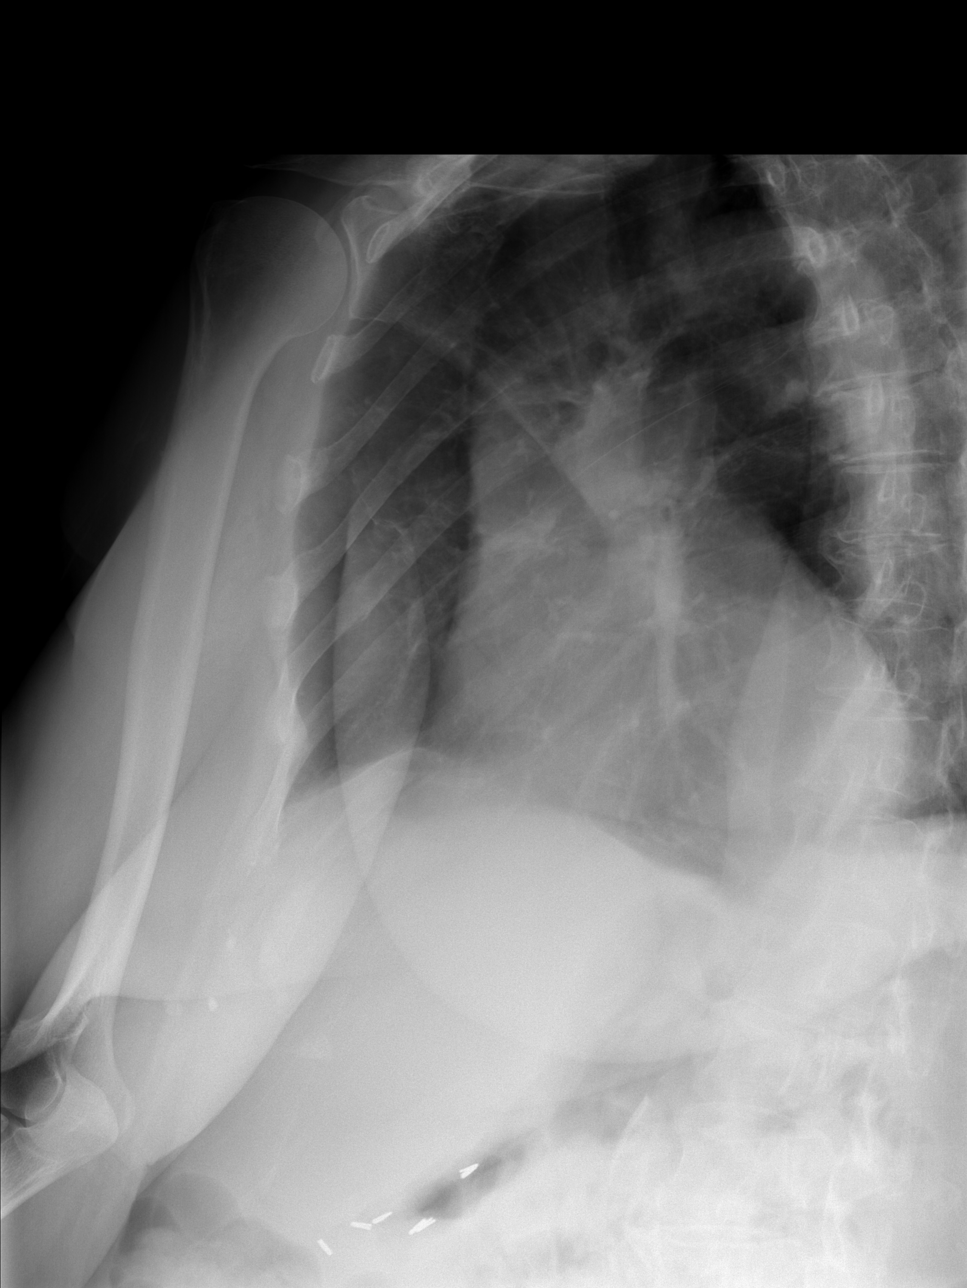

[4 of 4 positions shown; findings below may reference images not displayed]

FINDINGS: The cardiopericardial silhouette is within normal limits for size.  Lungs are clear.  Dedicated views of the right ribs reveal a healing anterior right 6th rib fracture.  No other fractures are seen.  There is no pneumothorax.
IMPRESSION: 1.  Healing right 6th anterior rib fracture.  
 2.  No pneumothorax.

## 2007-02-12 ENCOUNTER — Emergency Department (HOSPITAL_COMMUNITY): Admission: EM | Admit: 2007-02-12 | Discharge: 2007-02-12 | Payer: Self-pay | Admitting: Emergency Medicine

## 2007-02-14 ENCOUNTER — Ambulatory Visit: Payer: Self-pay | Admitting: Family Medicine

## 2007-02-16 DIAGNOSIS — I10 Essential (primary) hypertension: Secondary | ICD-10-CM

## 2007-02-16 DIAGNOSIS — E785 Hyperlipidemia, unspecified: Secondary | ICD-10-CM | POA: Insufficient documentation

## 2007-02-16 DIAGNOSIS — J439 Emphysema, unspecified: Secondary | ICD-10-CM

## 2007-02-16 DIAGNOSIS — Z87891 Personal history of nicotine dependence: Secondary | ICD-10-CM | POA: Insufficient documentation

## 2007-02-16 DIAGNOSIS — M949 Disorder of cartilage, unspecified: Secondary | ICD-10-CM

## 2007-02-16 DIAGNOSIS — E1159 Type 2 diabetes mellitus with other circulatory complications: Secondary | ICD-10-CM

## 2007-02-16 DIAGNOSIS — E1169 Type 2 diabetes mellitus with other specified complication: Secondary | ICD-10-CM

## 2007-02-16 DIAGNOSIS — E669 Obesity, unspecified: Secondary | ICD-10-CM

## 2007-02-16 DIAGNOSIS — F418 Other specified anxiety disorders: Secondary | ICD-10-CM | POA: Insufficient documentation

## 2007-02-16 DIAGNOSIS — E119 Type 2 diabetes mellitus without complications: Secondary | ICD-10-CM | POA: Insufficient documentation

## 2007-02-16 DIAGNOSIS — F319 Bipolar disorder, unspecified: Secondary | ICD-10-CM | POA: Insufficient documentation

## 2007-02-16 DIAGNOSIS — M899 Disorder of bone, unspecified: Secondary | ICD-10-CM | POA: Insufficient documentation

## 2007-02-17 ENCOUNTER — Ambulatory Visit: Payer: Self-pay | Admitting: Vascular Surgery

## 2007-02-17 ENCOUNTER — Emergency Department (HOSPITAL_COMMUNITY): Admission: EM | Admit: 2007-02-17 | Discharge: 2007-02-17 | Payer: Self-pay | Admitting: Emergency Medicine

## 2007-02-17 ENCOUNTER — Encounter (INDEPENDENT_AMBULATORY_CARE_PROVIDER_SITE_OTHER): Payer: Self-pay | Admitting: *Deleted

## 2007-02-18 ENCOUNTER — Emergency Department (HOSPITAL_COMMUNITY): Admission: EM | Admit: 2007-02-18 | Discharge: 2007-02-18 | Payer: Self-pay | Admitting: Emergency Medicine

## 2007-02-19 ENCOUNTER — Emergency Department (HOSPITAL_COMMUNITY): Admission: EM | Admit: 2007-02-19 | Discharge: 2007-02-19 | Payer: Self-pay | Admitting: Emergency Medicine

## 2007-02-20 ENCOUNTER — Ambulatory Visit: Payer: Self-pay | Admitting: Internal Medicine

## 2007-02-20 ENCOUNTER — Inpatient Hospital Stay (HOSPITAL_COMMUNITY): Admission: EM | Admit: 2007-02-20 | Discharge: 2007-03-03 | Payer: Self-pay | Admitting: Emergency Medicine

## 2007-03-07 IMAGING — CR DG THORACIC SPINE 2V
3 series · 3 of 3 positions shown · non-contrast
Comparison: 12/02/06.
COMPARISON: 12/02/06.
COMPARISON: 01/22/07.

CLINICAL DATA: Fell down stairs.    Chest, neck and pelvic trauma and pain.
 CHEST - 2 VIEW:

[t t-spine a.p.]
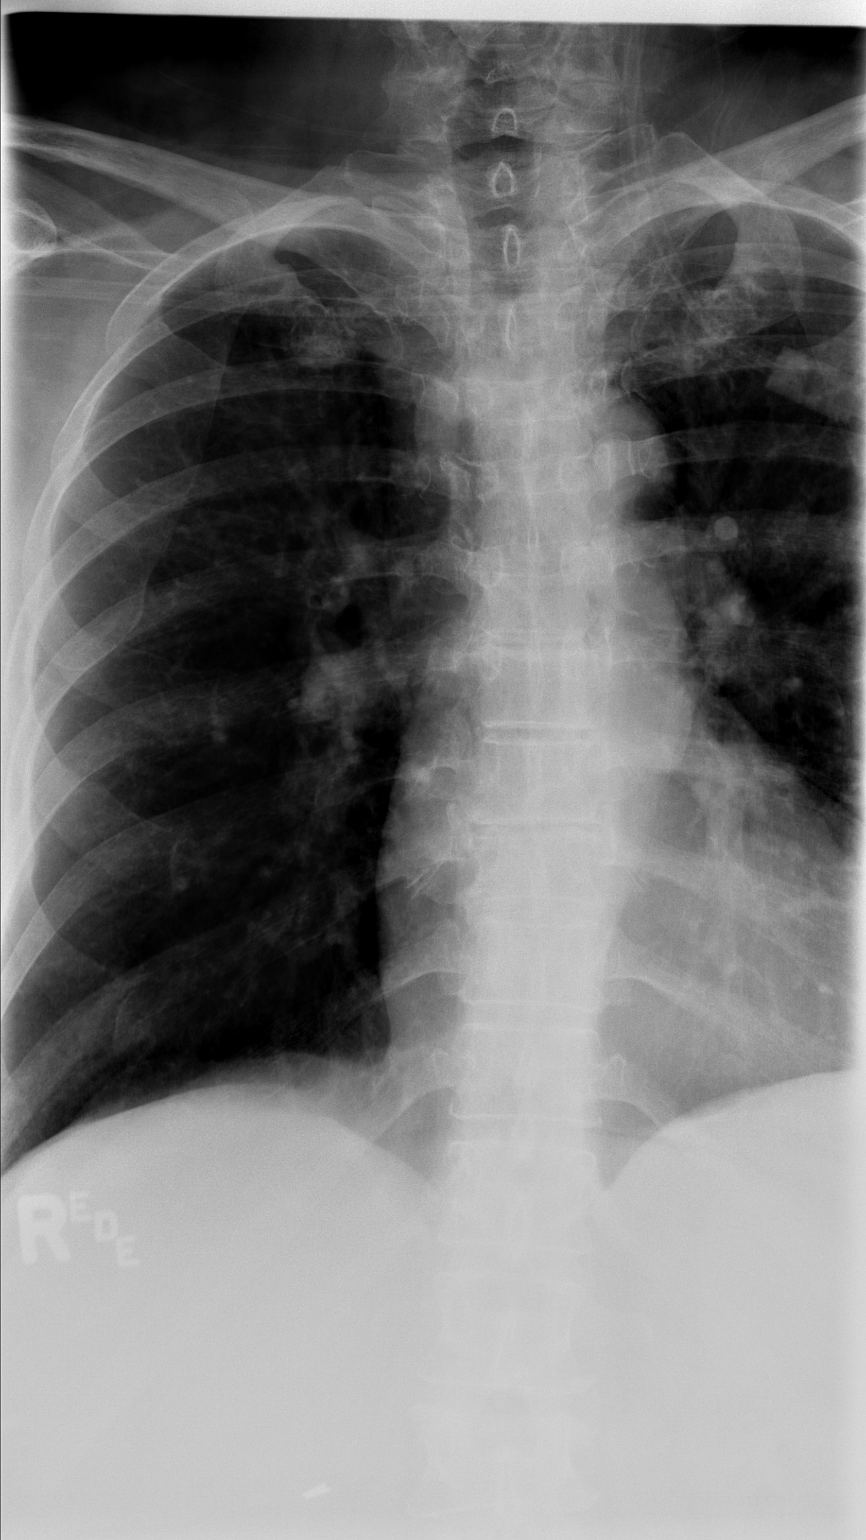

[t t-spine lat (1 of 2)]
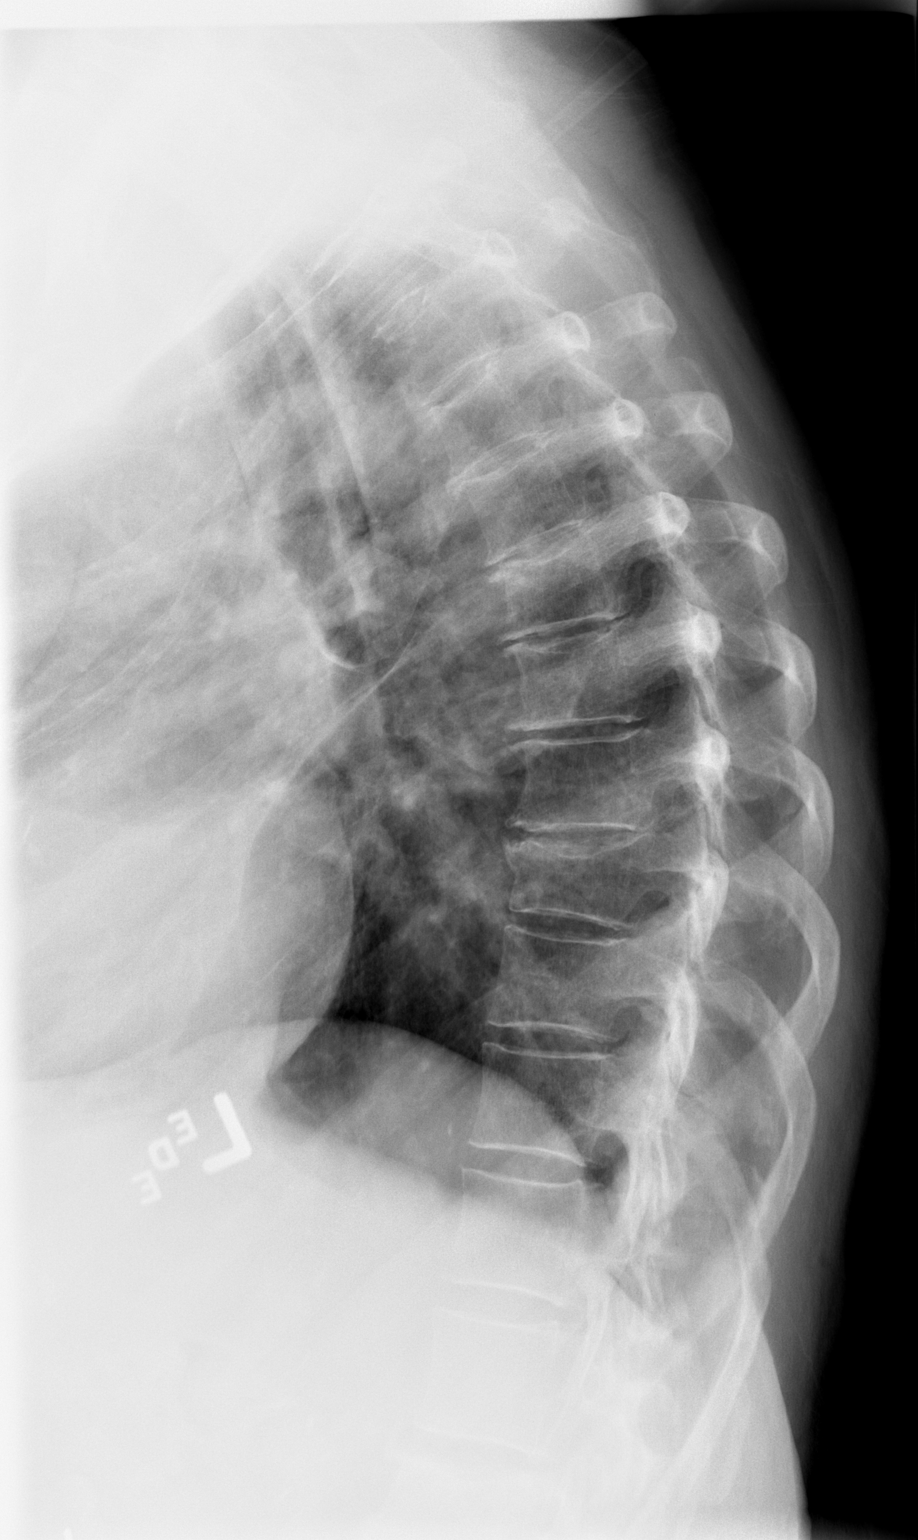

[t t-spine lat (2 of 2)]
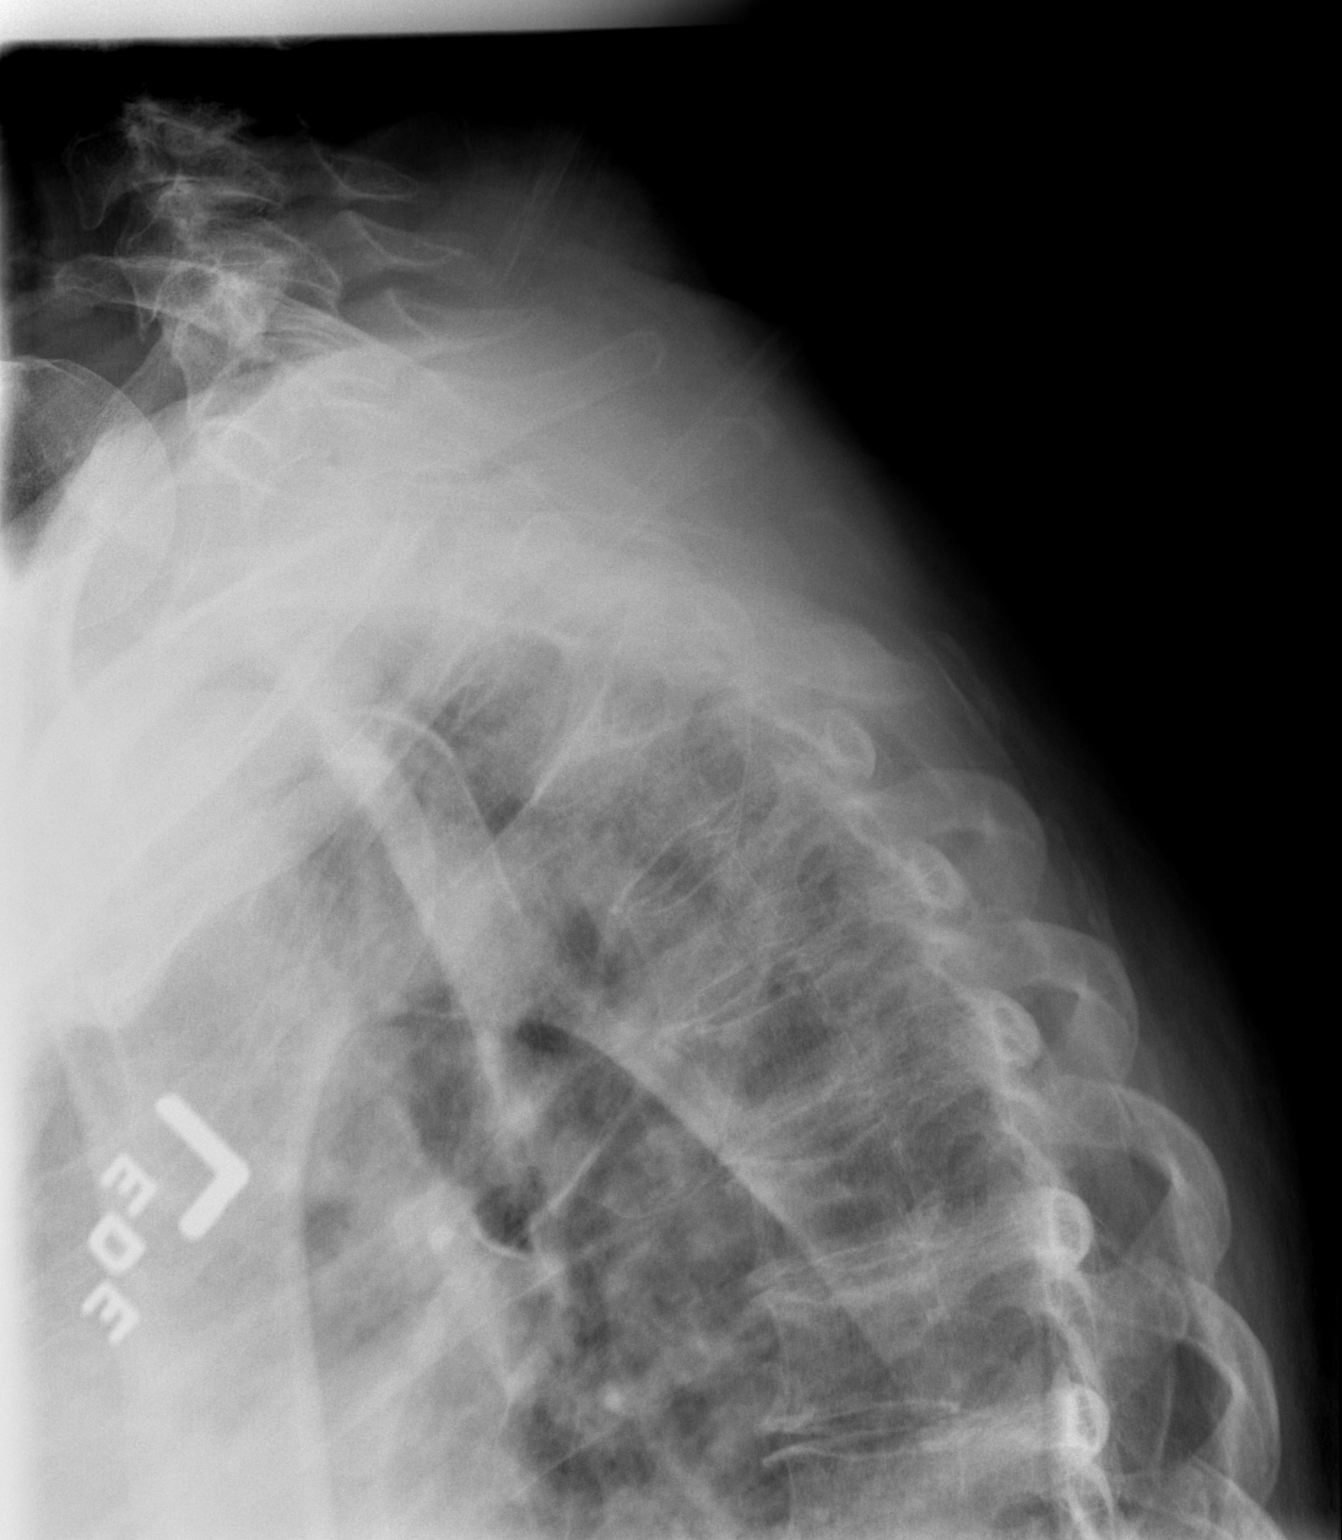

[3 of 3 positions shown; findings below may reference images not displayed]

FINDINGS: Both lungs are clear.  There is no evidence of pneumothorax or pleural effusion. Heart size is within normal limits allowing for patient positioning. No mass or adenopathy identified.
IMPRESSION: No active cardiopulmonary disease.
 THORACIC SPINE ? 3 VIEW:
FINDINGS: An old wedge compression fracture of the T-9 vertebral body is seen.  There is no evidence of acute fracture or subluxation. Mild degenerative disk disease is seen at multiple levels in the mid thoracic spine.
IMPRESSION: 1.  No acute findings.
 2.  Old mild T-9 vertebral body compression fracture.
 3.  Mild mid thoracic degenerative disk disease.
 LUMBAR SPINE ? 4 VIEW:
FINDINGS: Mild superior endplate compression fracture of the L-2 vertebral body is stable.  There is no evidence of acute fracture, spondylolysis, or spondylolisthesis.
 Degenerative disk disease is again seen at L4-5. Mild facet DJD in the lower lumbar spine is also unchanged. Osteopenia is noted.
IMPRESSION: 1.  No acute findings.
 2.  Old mild L-2 vertebral body compression fracture.  Osteopenia and lower lumbar spondylosis.
 PELVIS ? 1 VIEW:
FINDINGS: There is no evidence of pelvic fracture or diastasis.  Mild degenerative joint space narrowing is seen involving both hips.
IMPRESSION: No acute findings.

## 2007-03-07 IMAGING — CR DG PELVIS 1-2V
1 series · 1 of 1 positions shown · non-contrast
Comparison: 12/02/06.
COMPARISON: 12/02/06.
COMPARISON: 01/22/07.

CLINICAL DATA: Fell down stairs.    Chest, neck and pelvic trauma and pain.
 CHEST - 2 VIEW:

[t pelvis a.p.]
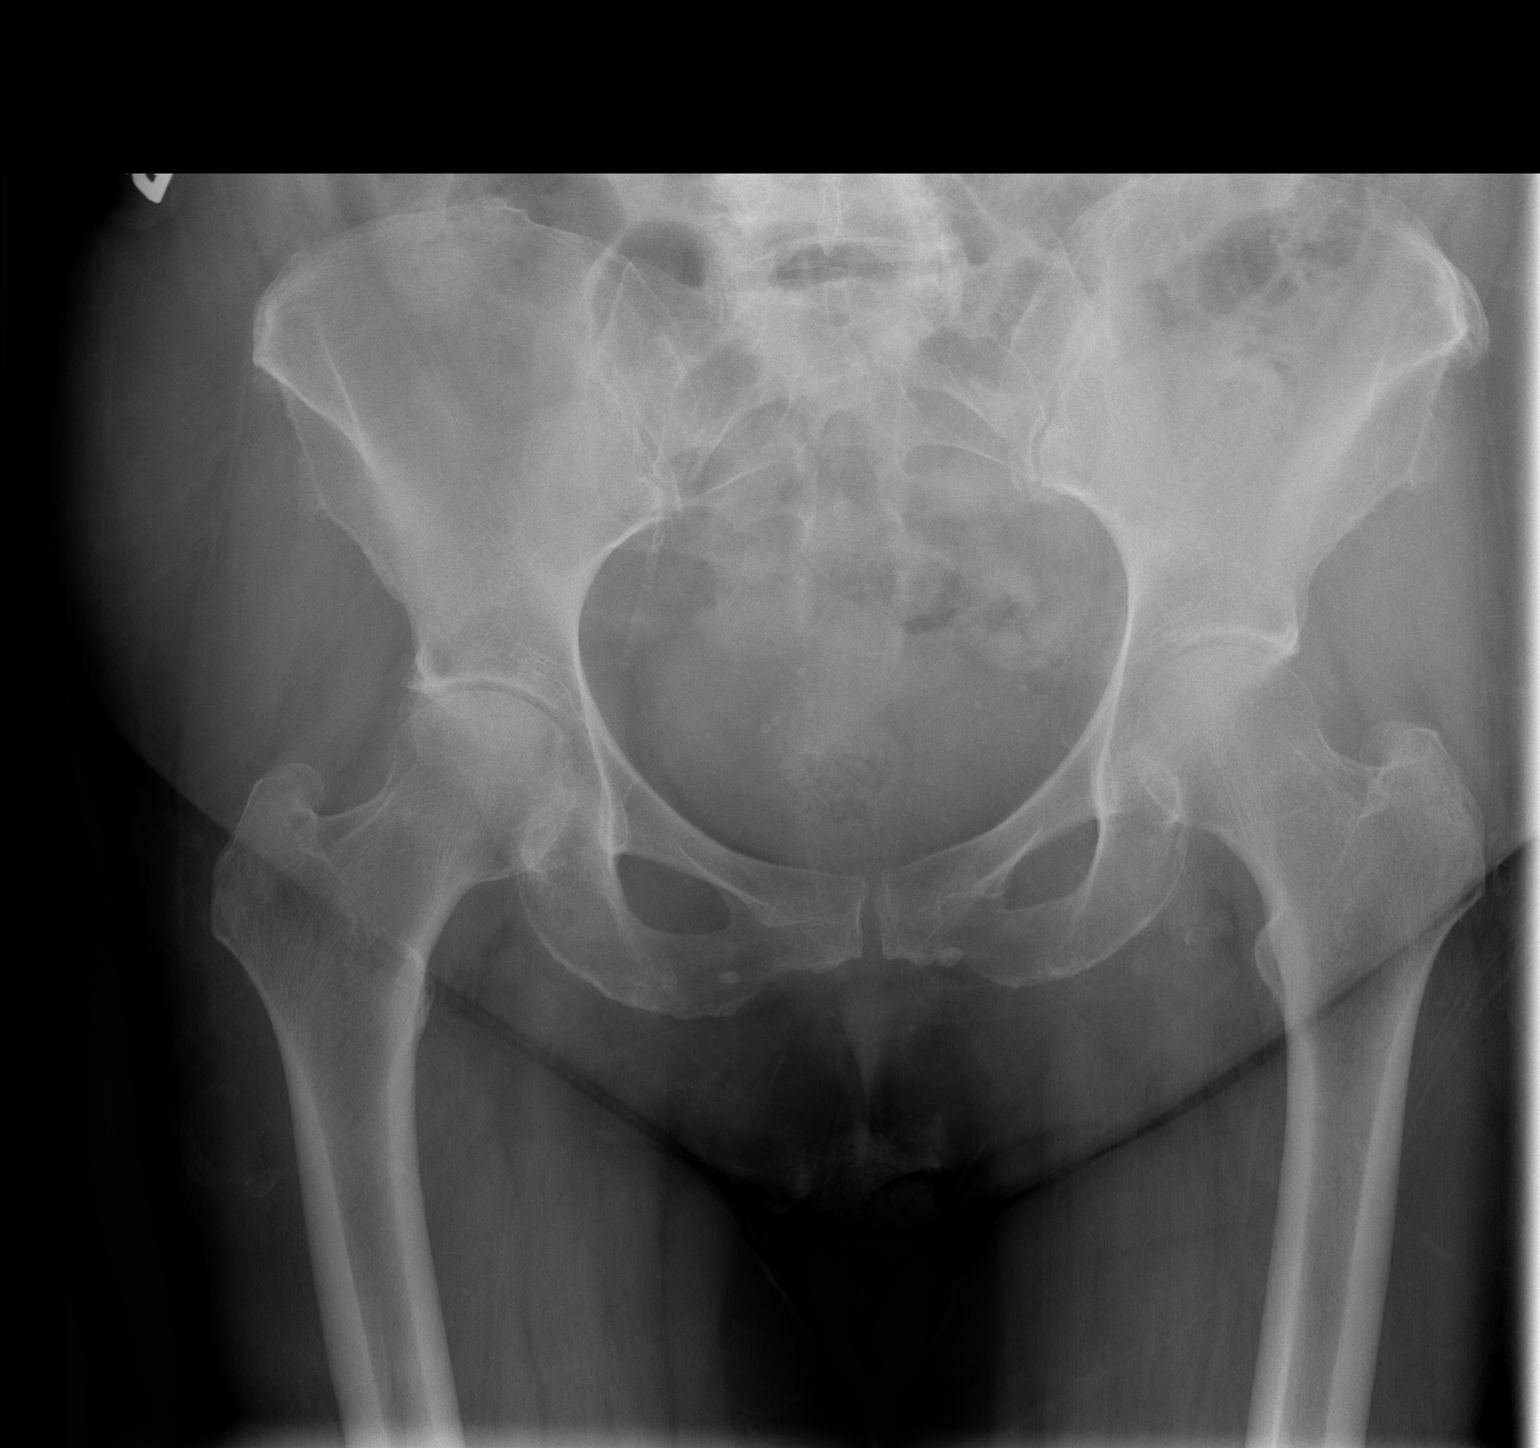

[1 of 1 positions shown; findings below may reference images not displayed]

FINDINGS: Both lungs are clear.  There is no evidence of pneumothorax or pleural effusion. Heart size is within normal limits allowing for patient positioning. No mass or adenopathy identified.
IMPRESSION: No active cardiopulmonary disease.
 THORACIC SPINE ? 3 VIEW:
FINDINGS: An old wedge compression fracture of the T-9 vertebral body is seen.  There is no evidence of acute fracture or subluxation. Mild degenerative disk disease is seen at multiple levels in the mid thoracic spine.
IMPRESSION: 1.  No acute findings.
 2.  Old mild T-9 vertebral body compression fracture.
 3.  Mild mid thoracic degenerative disk disease.
 LUMBAR SPINE ? 4 VIEW:
FINDINGS: Mild superior endplate compression fracture of the L-2 vertebral body is stable.  There is no evidence of acute fracture, spondylolysis, or spondylolisthesis.
 Degenerative disk disease is again seen at L4-5. Mild facet DJD in the lower lumbar spine is also unchanged. Osteopenia is noted.
IMPRESSION: 1.  No acute findings.
 2.  Old mild L-2 vertebral body compression fracture.  Osteopenia and lower lumbar spondylosis.
 PELVIS ? 1 VIEW:
FINDINGS: There is no evidence of pelvic fracture or diastasis.  Mild degenerative joint space narrowing is seen involving both hips.
IMPRESSION: No acute findings.

## 2007-03-07 IMAGING — CT CT HEAD W/O CM
5 of 7 series · 17 of 47 positions shown, 19 images · IV contrast (agent unspecified)
Comparison: CT head 12/24/06.  Cervical spine 07/22/06.

CLINICAL DATA: Fall.  
 HEAD CT WITHOUT CONTRAST:
TECHNIQUE: Contiguous axial images were obtained from the base of the skull through the vertex according to standard protocol without contrast.
TECHNIQUE: Multidetector CT imaging of the cervical spine was performed.  Multiplanar   CT image reconstructions were also generated.

[Series 3: recon 2: brain · axial · 0.49mm/px · z∈[+277,+383]mm · 5 of 72 slices shown, 7 images]
[im 12/72  brain]
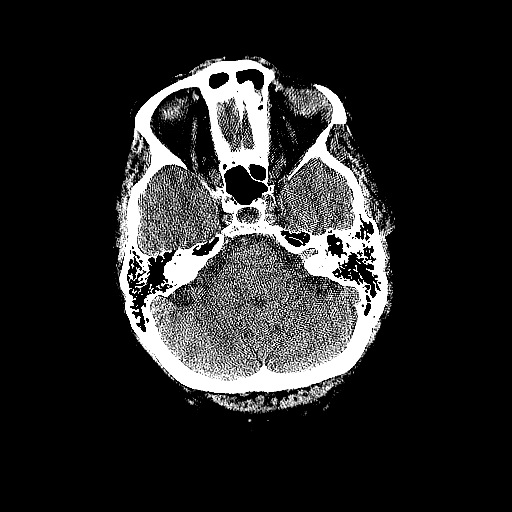
[im 12/72  bone]
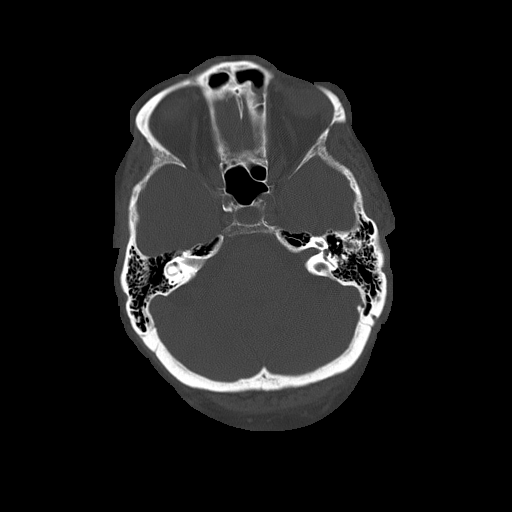
[im 24/72  brain]
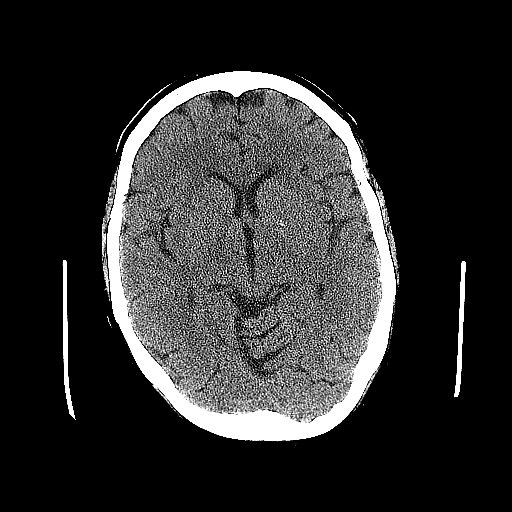
[im 36/72  brain]
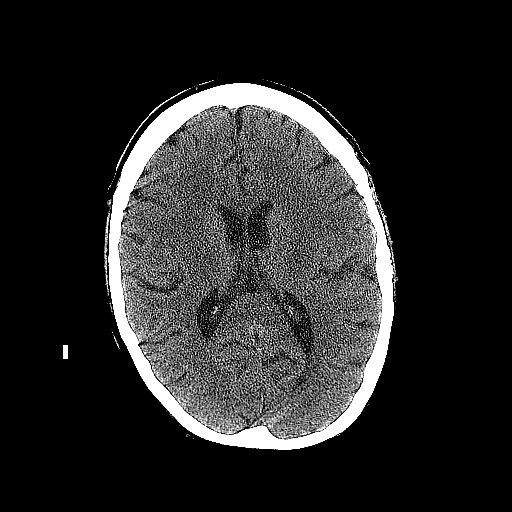
[im 48/72  brain]
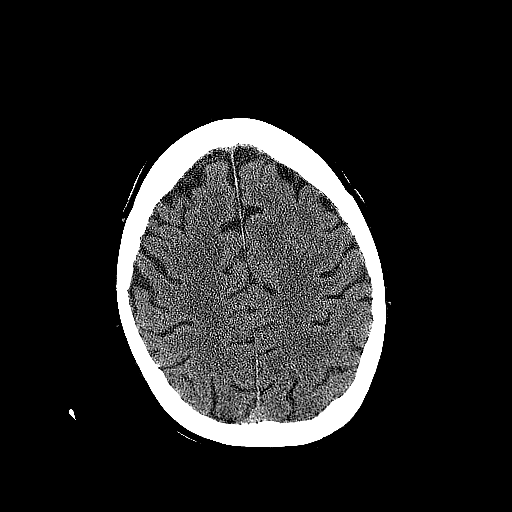
[im 60/72  brain]
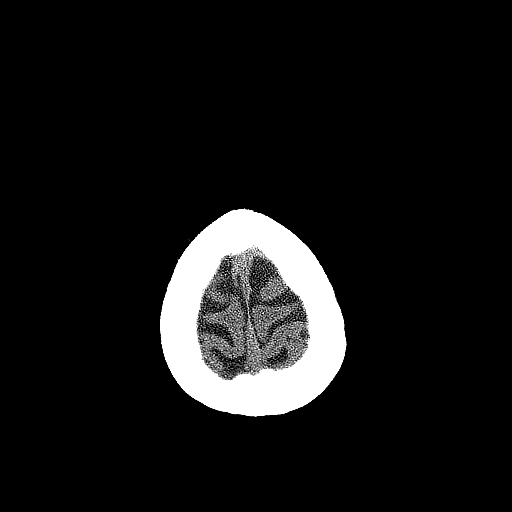
[im 60/72  bone]
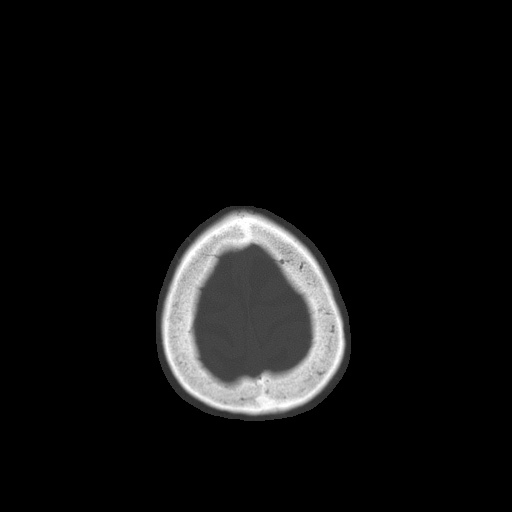

[Series 5: cervical spine · axial · 0.31mm/px · z∈[+91,+118]mm · 2 of 69 slices shown]
[im 12/69  brain]
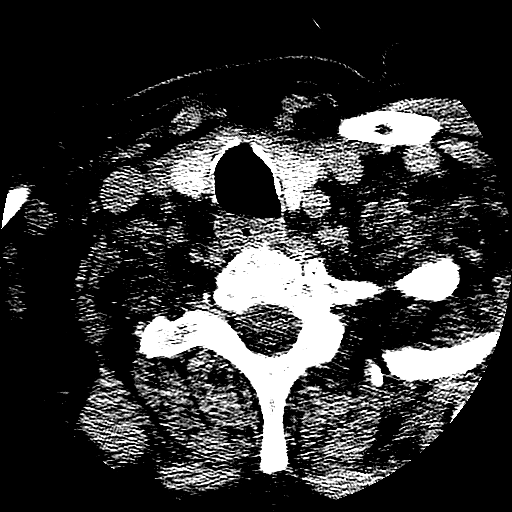
[im 23/69  brain]
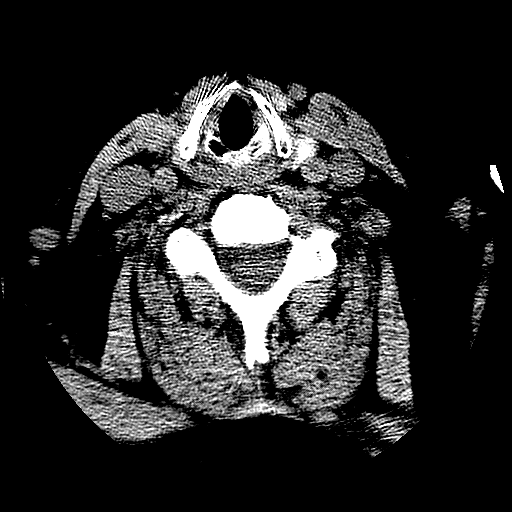

[Series 700: reformatted · sagittal · 0.33mm/px · 3 of 39 slices shown (1 of 3)]
[im 13/39  brain]
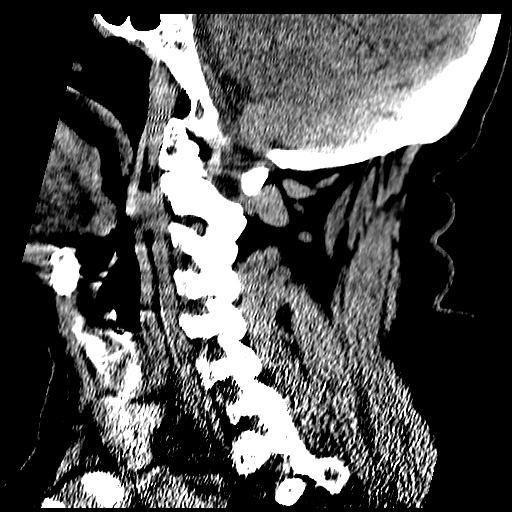
[im 20/39  brain]
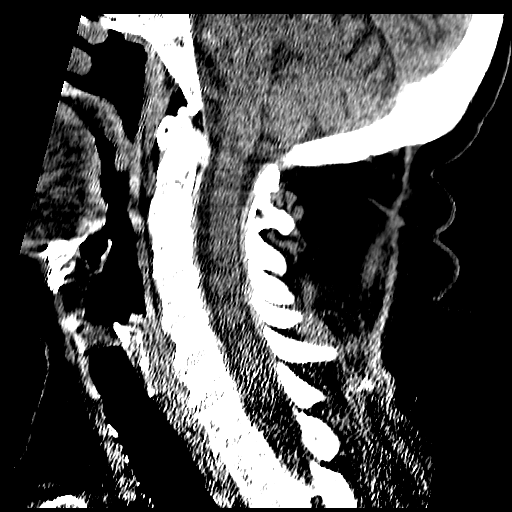
[im 26/39  brain]
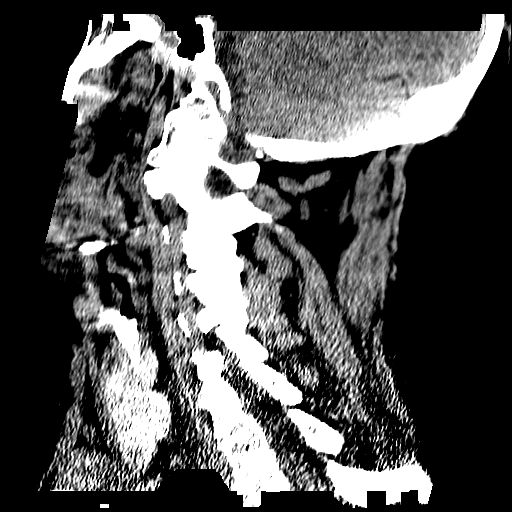

[Series 701: reformatted · coronal · 0.33mm/px · 3 of 39 slices shown (2 of 3)]
[im 13/39  brain]
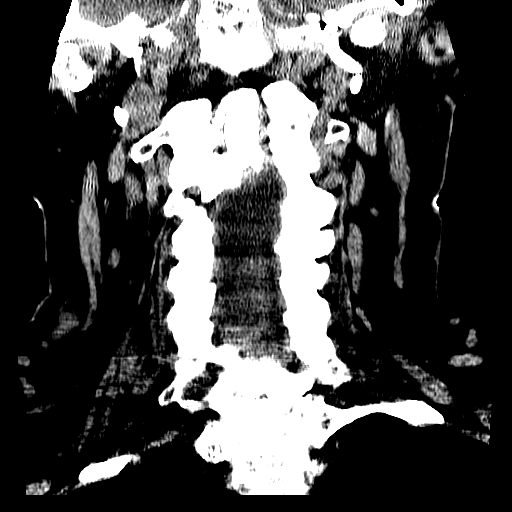
[im 17/39  brain]
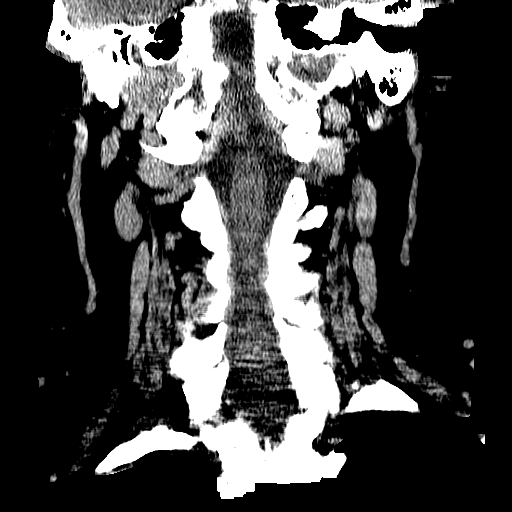
[im 22/39  brain]
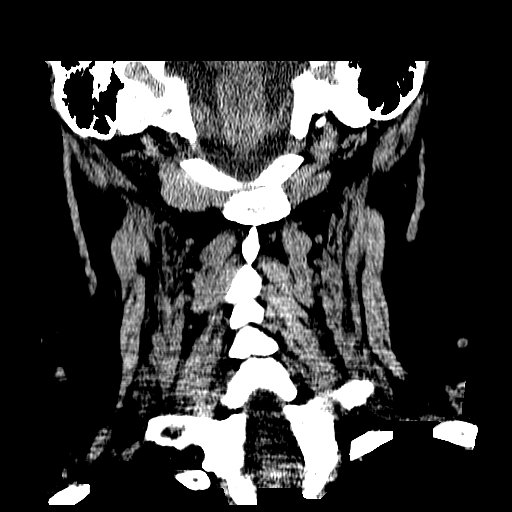

[Series 702: reformatted · axial · 0.33mm/px · z∈[+89,+157]mm · 4 of 65 slices shown (3 of 3)]
[im 13/65  brain]
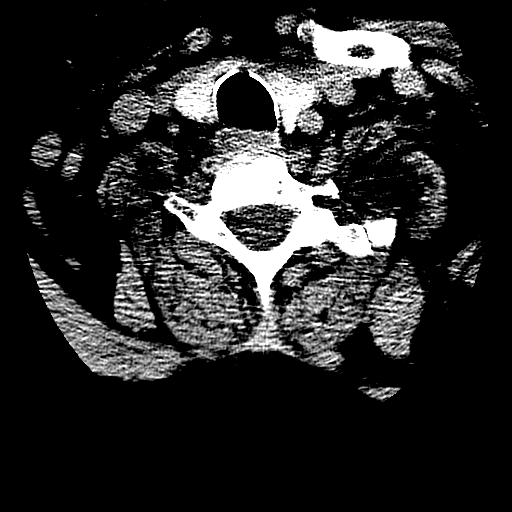
[im 26/65  brain]
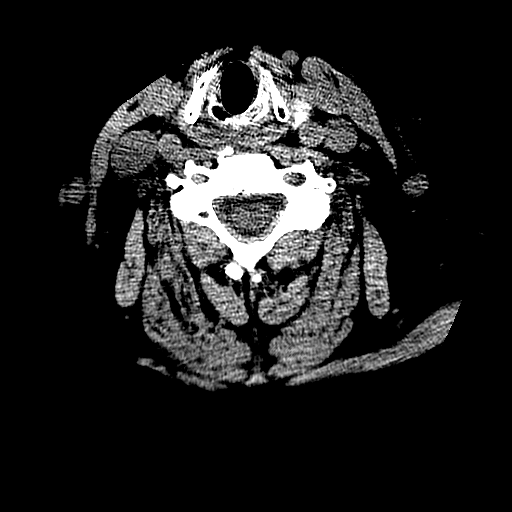
[im 39/65  brain]
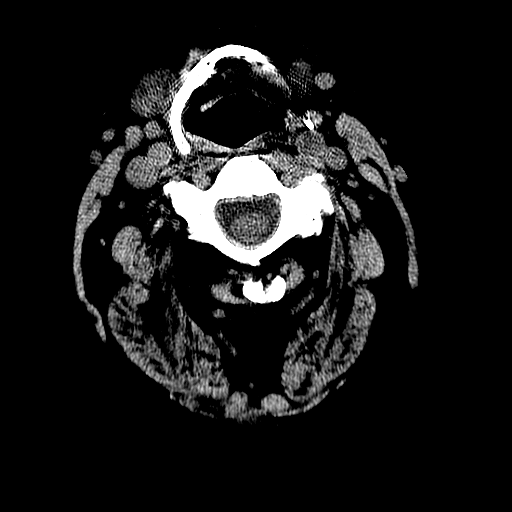
[im 52/65  brain]
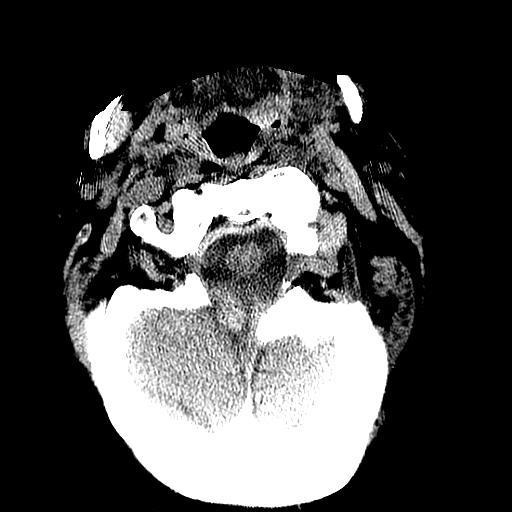

[17 of 47 positions shown; findings below may reference images not displayed]

FINDINGS: Soft tissue windows demonstrate no significant soft tissue swelling.  
 Bone windows demonstrate mild mucosal thickening involving the maxillary sinuses bilaterally.  Clear mastoid air cells.  
 Intracranial imaging demonstrates age advanced cerebral atrophy.  No mass, hemorrhage, hydrocephalus, intraaxial, or extraaxial fluid collection.  Age advanced vascular calcifications in bilateral carotids and left vertebral arteries.
IMPRESSION: 1.  No acute process.
 2.  Cerebral atrophy. 
 3.  Mild sinus disease.  
 CERVICAL SPINE   CT WITHOUT CONTRAST:
FINDINGS: Soft tissue windows demonstrate age advanced atherosclerosis in the carotid arteries bilaterally.  
 Spinal visualization from the occiput through the bottom of T-1.  The skull base is intact.  There is no fracture.  Prevertebral soft tissues are normal.  Degenerative changes involve C5-6 and C6-7.  Facets are well aligned.  C1-2 are normal.
IMPRESSION: Degenerative change without acute osseous abnormality.

## 2007-03-07 IMAGING — CR DG CHEST 2V
1 series · 1 of 1 positions shown · non-contrast
Comparison: 12/02/06.
COMPARISON: 12/02/06.
COMPARISON: 01/22/07.

CLINICAL DATA: Fell down stairs.    Chest, neck and pelvic trauma and pain.
 CHEST - 2 VIEW:

[w chest lat]
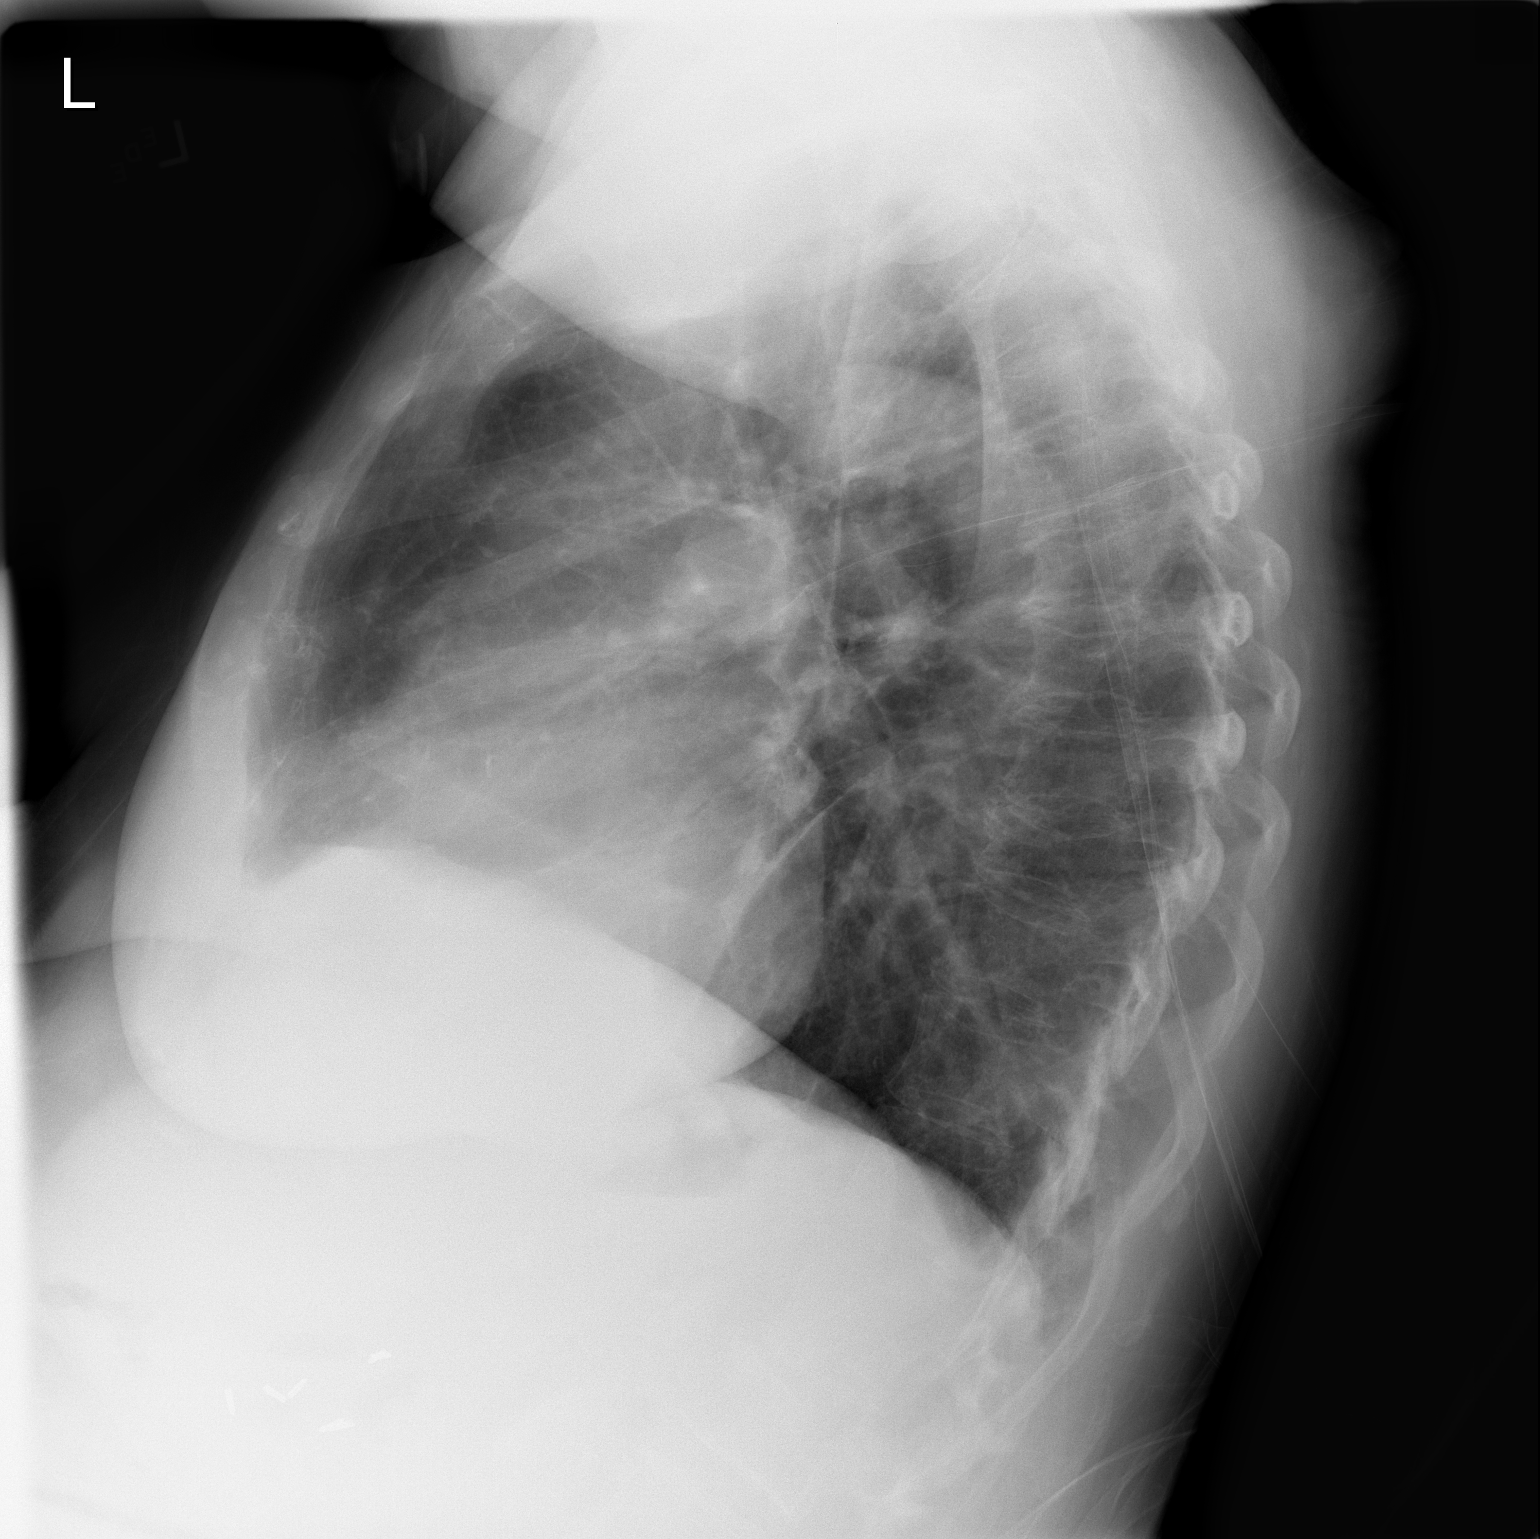

[1 of 1 positions shown; findings below may reference images not displayed]

FINDINGS: Both lungs are clear.  There is no evidence of pneumothorax or pleural effusion. Heart size is within normal limits allowing for patient positioning. No mass or adenopathy identified.
IMPRESSION: No active cardiopulmonary disease.
 THORACIC SPINE ? 3 VIEW:
FINDINGS: An old wedge compression fracture of the T-9 vertebral body is seen.  There is no evidence of acute fracture or subluxation. Mild degenerative disk disease is seen at multiple levels in the mid thoracic spine.
IMPRESSION: 1.  No acute findings.
 2.  Old mild T-9 vertebral body compression fracture.
 3.  Mild mid thoracic degenerative disk disease.
 LUMBAR SPINE ? 4 VIEW:
FINDINGS: Mild superior endplate compression fracture of the L-2 vertebral body is stable.  There is no evidence of acute fracture, spondylolysis, or spondylolisthesis.
 Degenerative disk disease is again seen at L4-5. Mild facet DJD in the lower lumbar spine is also unchanged. Osteopenia is noted.
IMPRESSION: 1.  No acute findings.
 2.  Old mild L-2 vertebral body compression fracture.  Osteopenia and lower lumbar spondylosis.
 PELVIS ? 1 VIEW:
FINDINGS: There is no evidence of pelvic fracture or diastasis.  Mild degenerative joint space narrowing is seen involving both hips.
IMPRESSION: No acute findings.

## 2007-03-07 IMAGING — CR DG LUMBAR SPINE COMPLETE 4+V
5 series · 5 of 5 positions shown · non-contrast
Comparison: 12/02/06.
COMPARISON: 12/02/06.
COMPARISON: 01/22/07.

CLINICAL DATA: Fell down stairs.    Chest, neck and pelvic trauma and pain.
 CHEST - 2 VIEW:

[t l-spine a.p.]
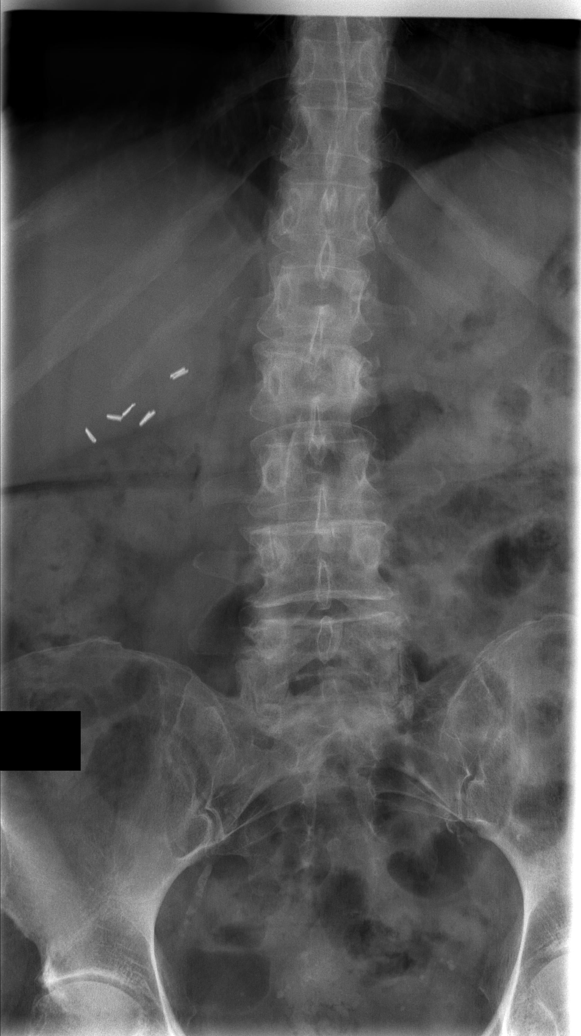

[t l-spine oblique exposure (1 of 2)]
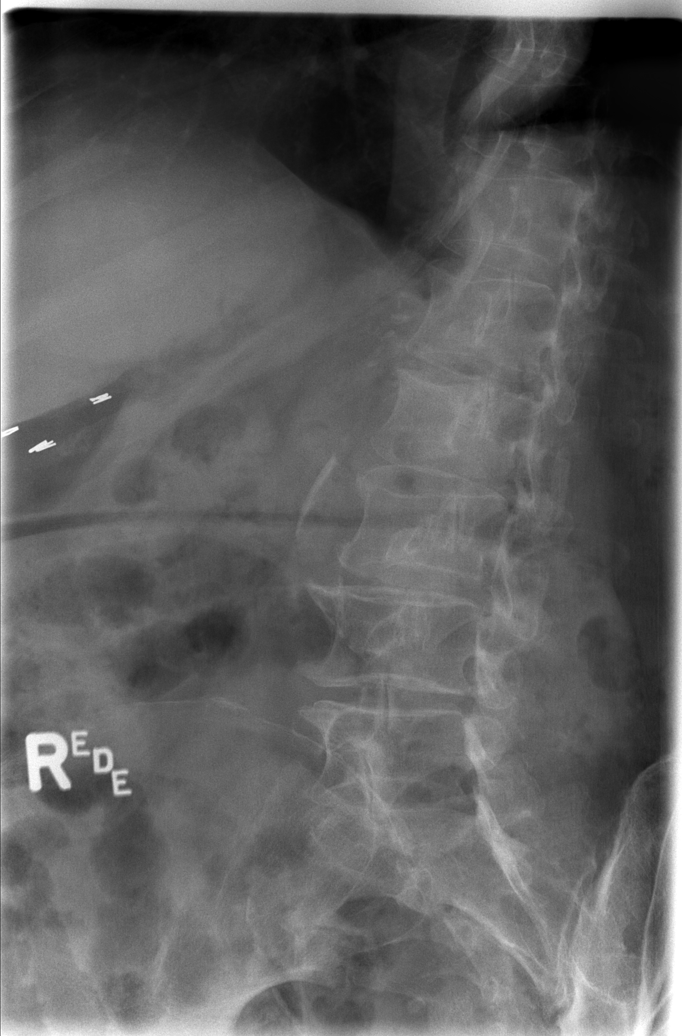

[t l-spine oblique exposure (2 of 2)]
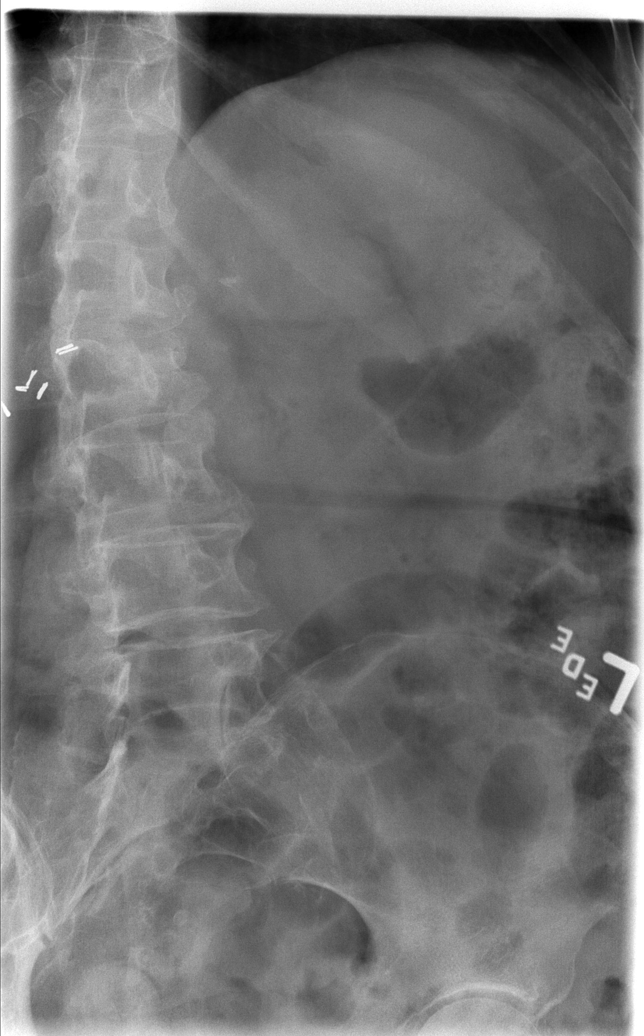

[t l-spine lat]
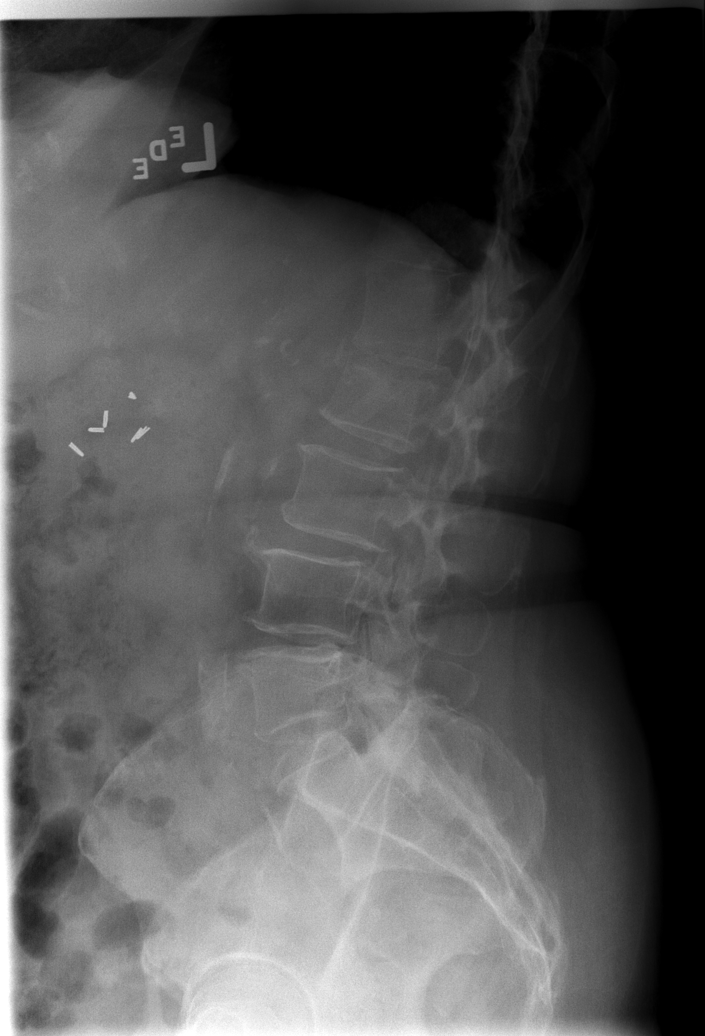

[t l-spine l5-s1 spot]
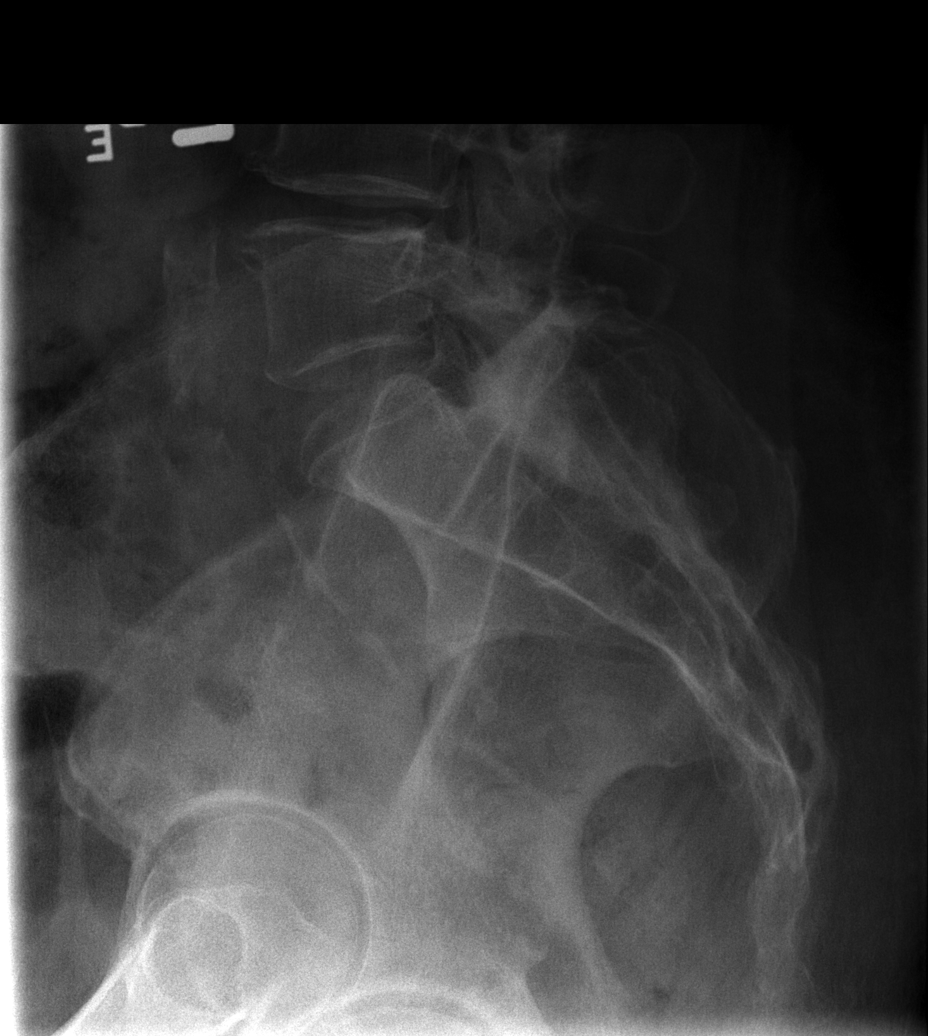

[5 of 5 positions shown; findings below may reference images not displayed]

FINDINGS: Both lungs are clear.  There is no evidence of pneumothorax or pleural effusion. Heart size is within normal limits allowing for patient positioning. No mass or adenopathy identified.
IMPRESSION: No active cardiopulmonary disease.
 THORACIC SPINE ? 3 VIEW:
FINDINGS: An old wedge compression fracture of the T-9 vertebral body is seen.  There is no evidence of acute fracture or subluxation. Mild degenerative disk disease is seen at multiple levels in the mid thoracic spine.
IMPRESSION: 1.  No acute findings.
 2.  Old mild T-9 vertebral body compression fracture.
 3.  Mild mid thoracic degenerative disk disease.
 LUMBAR SPINE ? 4 VIEW:
FINDINGS: Mild superior endplate compression fracture of the L-2 vertebral body is stable.  There is no evidence of acute fracture, spondylolysis, or spondylolisthesis.
 Degenerative disk disease is again seen at L4-5. Mild facet DJD in the lower lumbar spine is also unchanged. Osteopenia is noted.
IMPRESSION: 1.  No acute findings.
 2.  Old mild L-2 vertebral body compression fracture.  Osteopenia and lower lumbar spondylosis.
 PELVIS ? 1 VIEW:
FINDINGS: There is no evidence of pelvic fracture or diastasis.  Mild degenerative joint space narrowing is seen involving both hips.
IMPRESSION: No acute findings.

## 2007-03-09 ENCOUNTER — Emergency Department (HOSPITAL_COMMUNITY): Admission: EM | Admit: 2007-03-09 | Discharge: 2007-03-09 | Payer: Self-pay | Admitting: Emergency Medicine

## 2007-03-09 ENCOUNTER — Encounter: Payer: Self-pay | Admitting: Family Medicine

## 2007-03-10 IMAGING — CR DG CHEST 2V
2 series · 2 of 2 positions shown · non-contrast
Comparison: 02/02/07.

CLINICAL DATA: Dyspnea, cough and congestion.
 CHEST ? 2 VIEW:

[w chest pa]
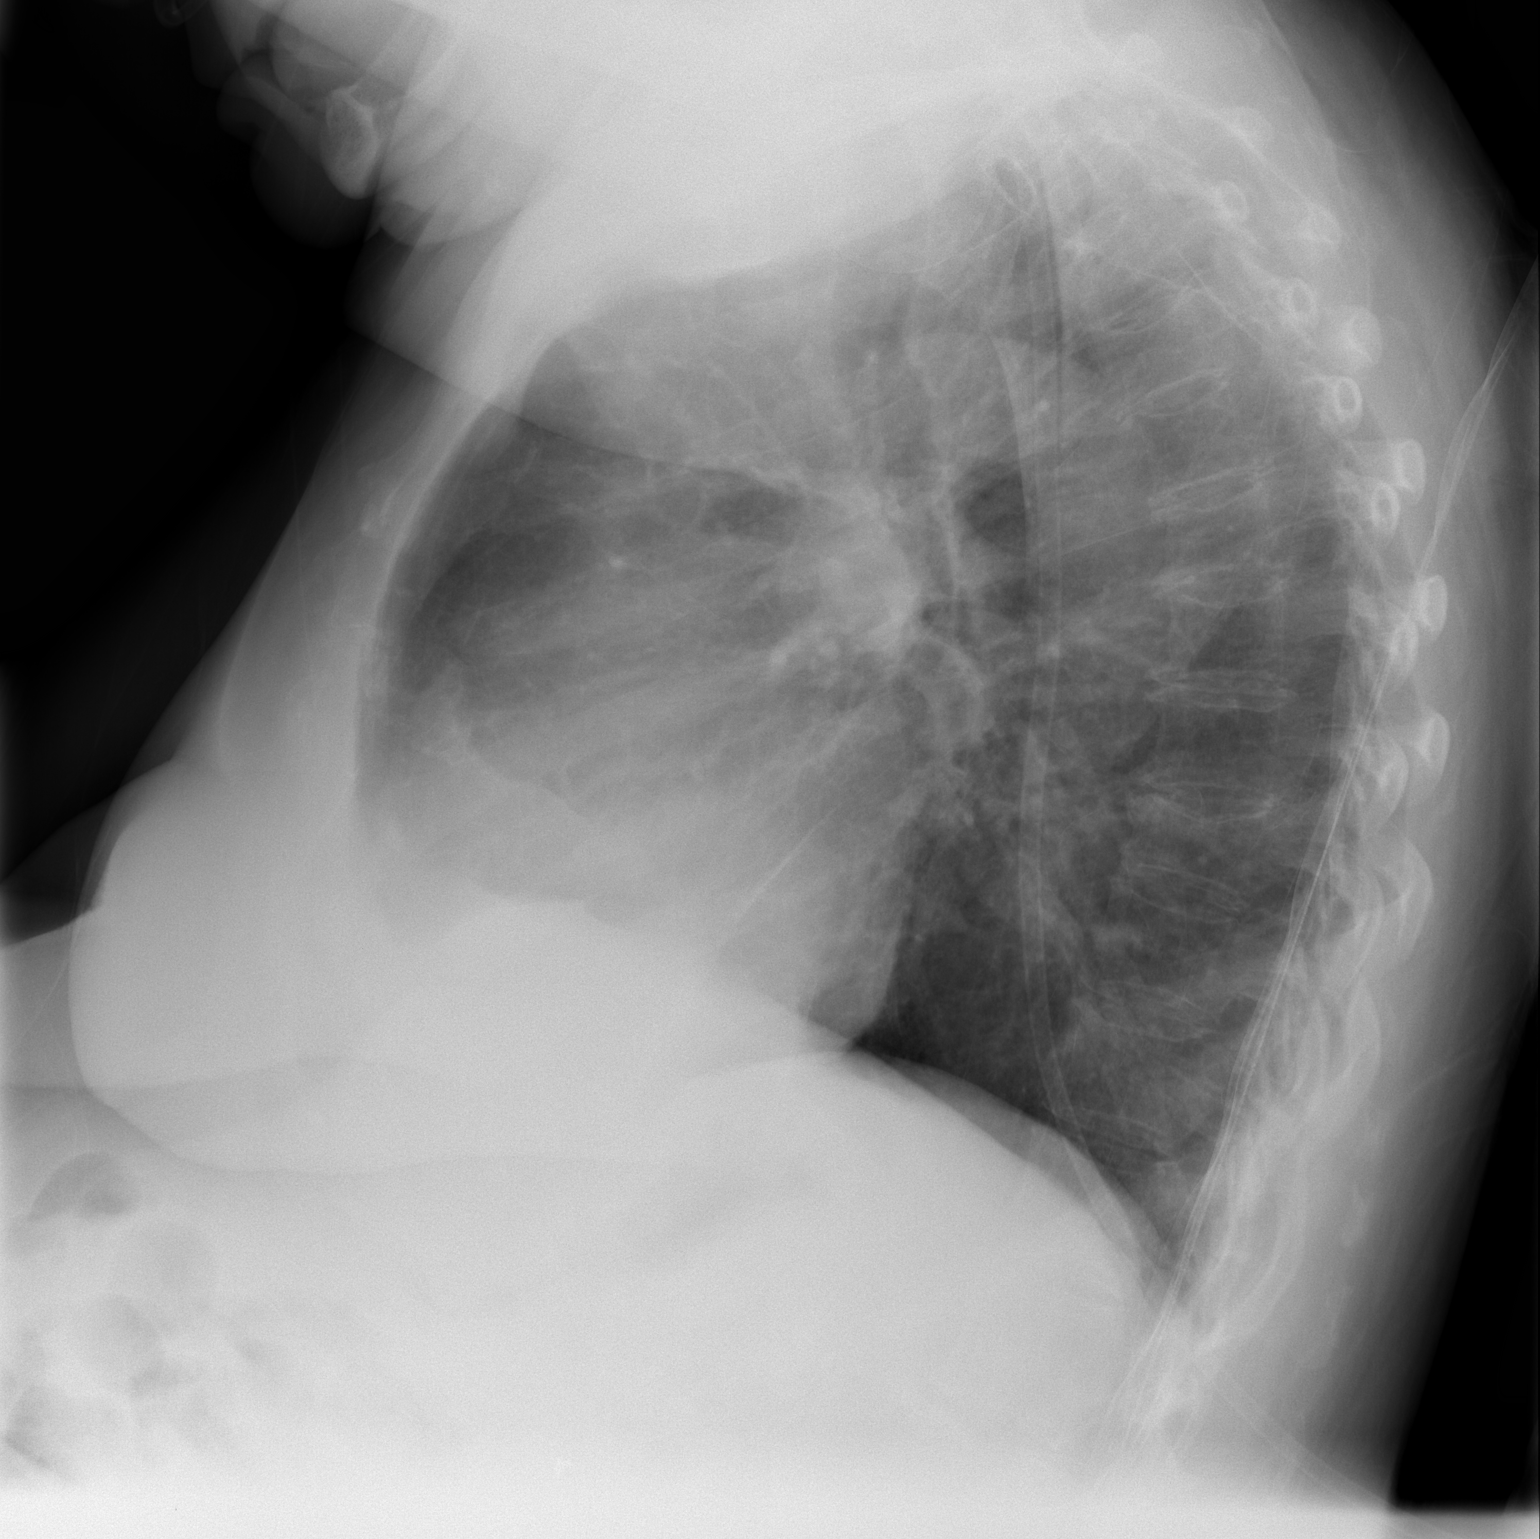

[view not recorded]
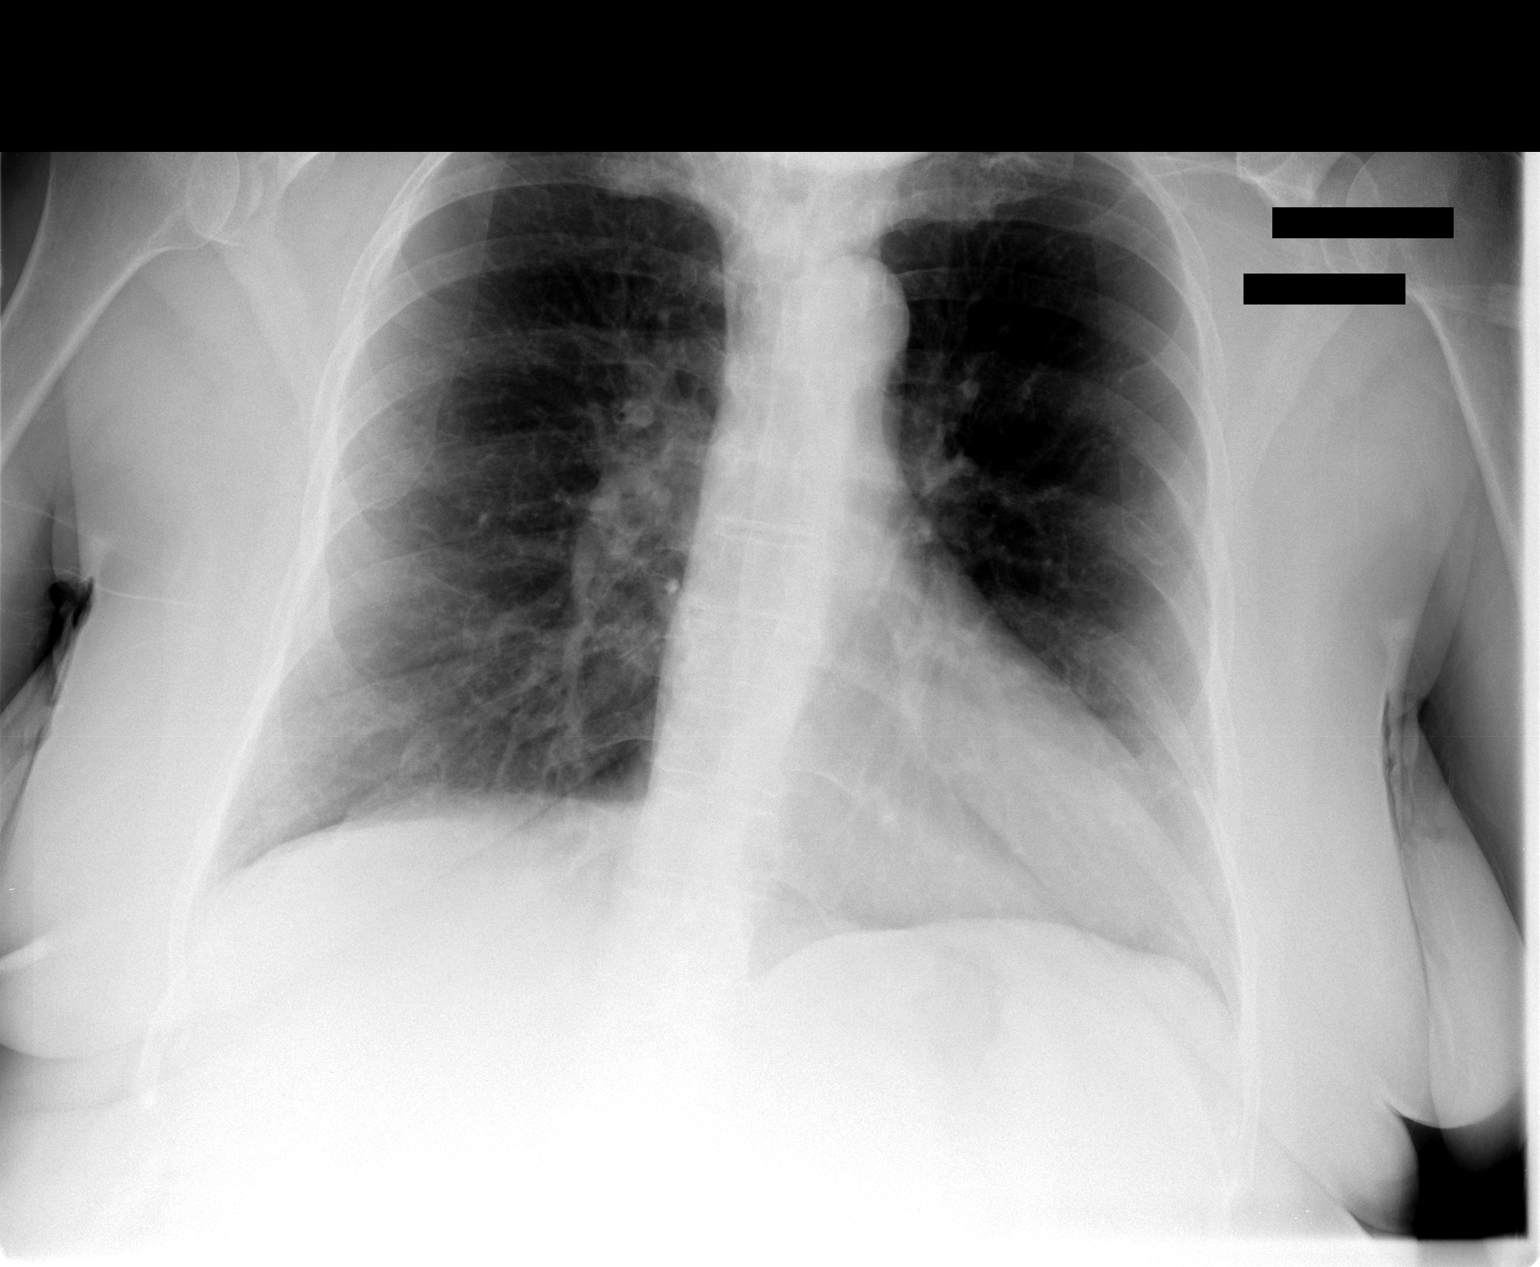

[2 of 2 positions shown; findings below may reference images not displayed]

FINDINGS: Upper limits of normal heart size and mild peribronchial thickening are again noted.  Mild bibasilar scarring is present.  COPD is present.  A mid thoracic compression fracture is stable.
IMPRESSION: Cardiomegaly and COPD without evidence of acute cardiopulmonary disease.

## 2007-03-16 IMAGING — CR DG LUMBAR SPINE COMPLETE 4+V
5 series · 5 of 5 positions shown · non-contrast
Comparison: 02/02/07.
COMPARISON: 02/02/07.

CLINICAL DATA: Fall with low back, pelvic, and left leg pain.
 PELVIS ? SINGLE VIEW:

[t l-spine a.p.]
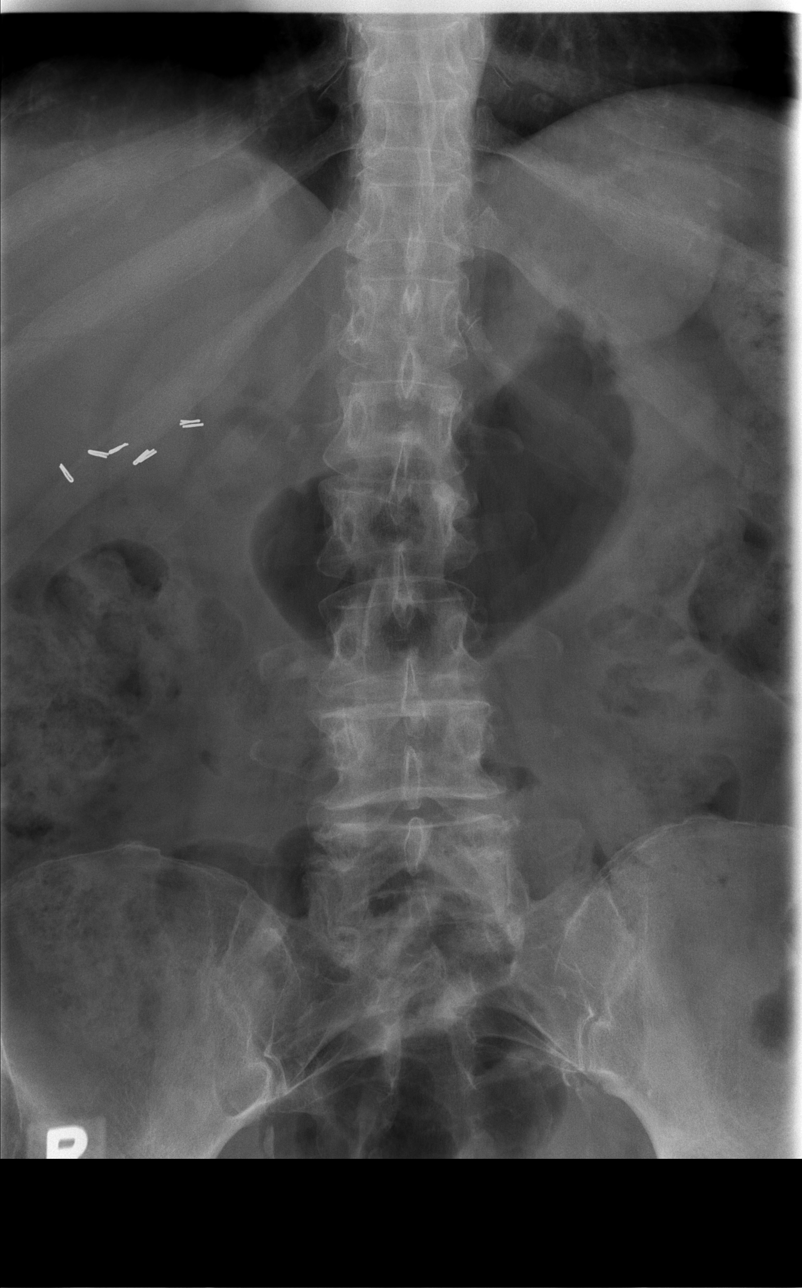

[t l-spine oblique exposure * (1 of 2)]
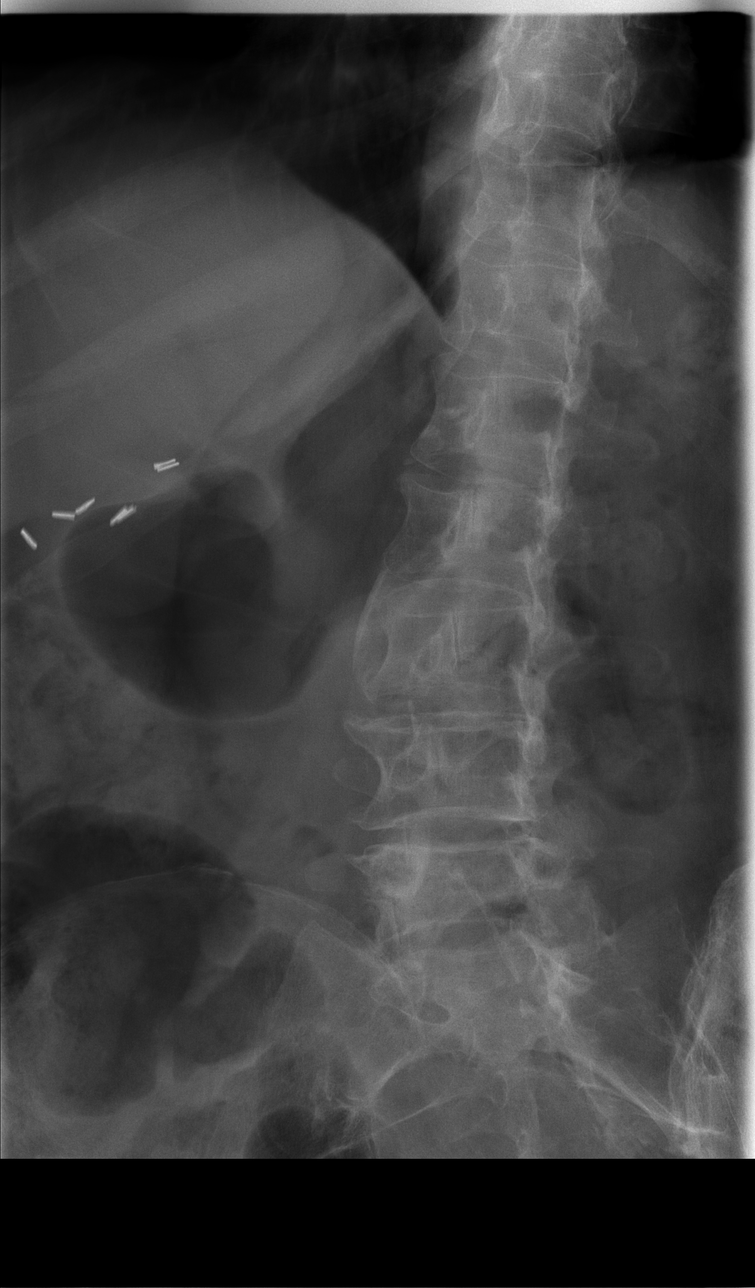

[t l-spine oblique exposure * (2 of 2)]
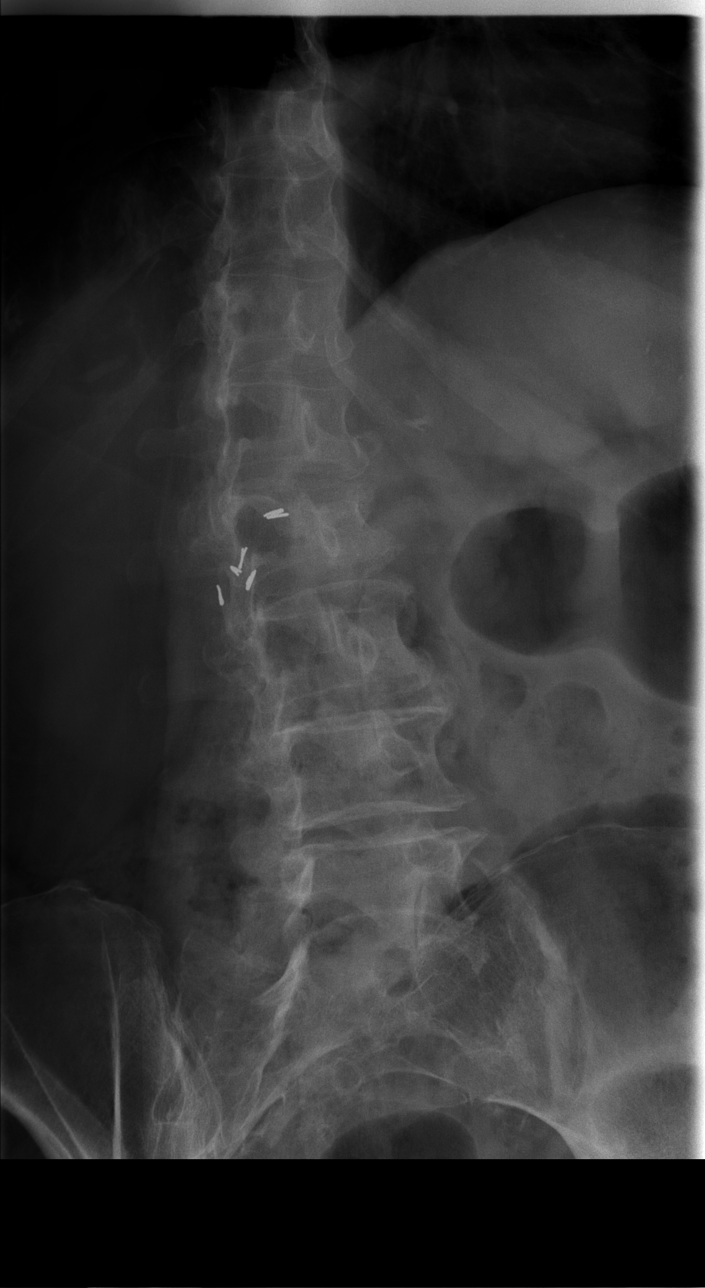

[t l-spine lat *]
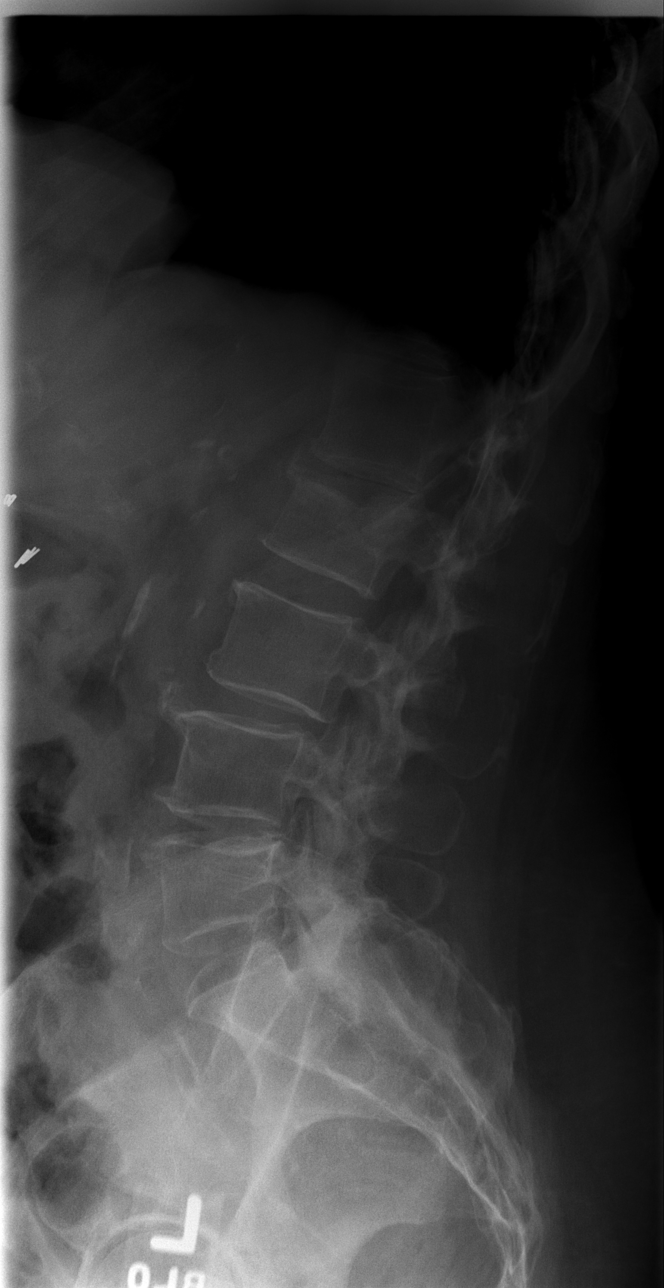

[t l-spine l5-s1 spot]
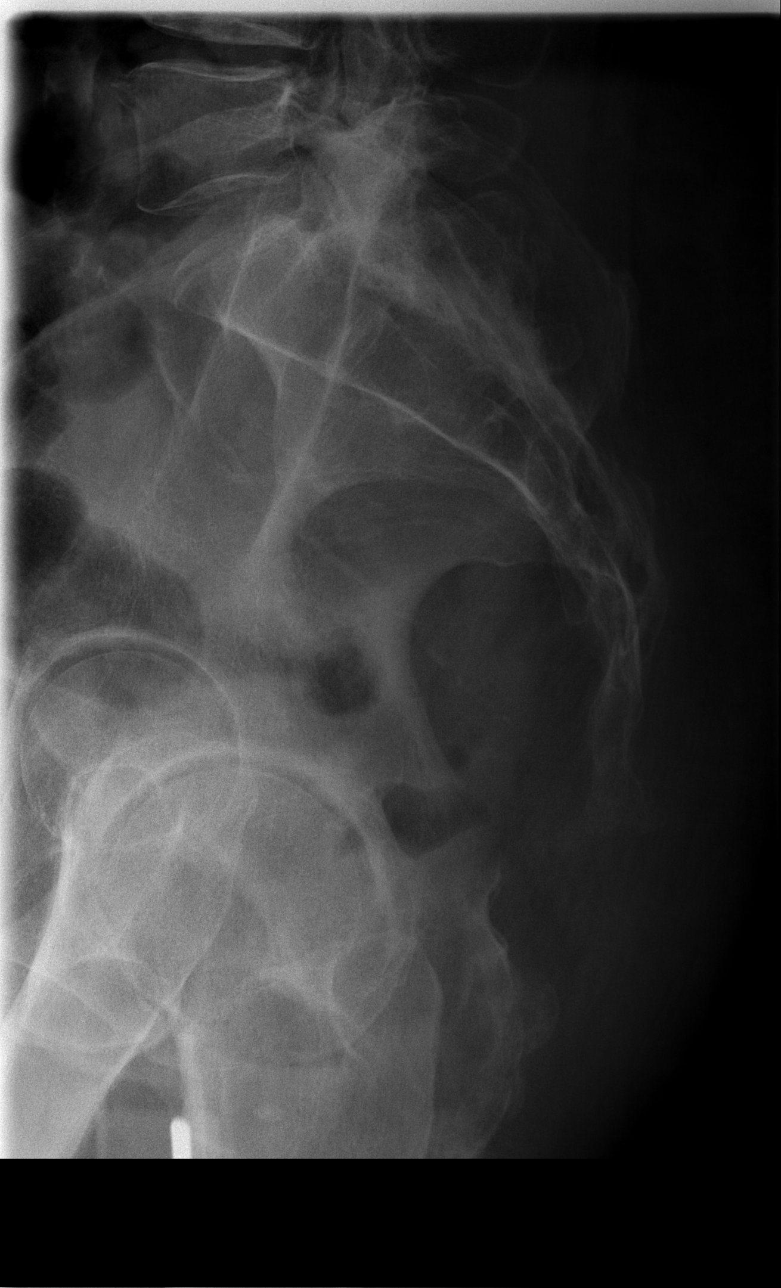

[5 of 5 positions shown; findings below may reference images not displayed]

FINDINGS: There is no evidence of acute fracture, subluxation, or dislocation.  Degenerative changes in the lower lumbar spine and minimal degenerative changes in the hips are noted.
IMPRESSION: No acute abnormalities.
 LUMBAR SPINE ? 5 VIEW:
FINDINGS: Five non-rib-bearing lumbar type vertebrae are again noted in normal alignment with remote 20% L2 superior end-plate compression fracture.  There is moderate degenerative disk disease and spondylosis throughout the lumbar spine as well as diffuse osteopenia again noted.  No significant changes are present from the prior study.
IMPRESSION: 1.  No acute abnormalities.
 2.  Stable L2 compression fracture and moderate degenerative disk disease and spondylosis.
 LEFT FEMUR ? 2 VIEW:
FINDINGS: There is no evidence of fracture or dislocation. No other soft tissue or bone abnormalities are identified.
IMPRESSION: Negative.

## 2007-03-16 IMAGING — CR DG FEMUR 2V*L*
5 series · 5 of 5 positions shown · non-contrast
Comparison: 02/02/07.
COMPARISON: 02/02/07.

CLINICAL DATA: Fall with low back, pelvic, and left leg pain.
 PELVIS ? SINGLE VIEW:

[t femur with hip  ap left]
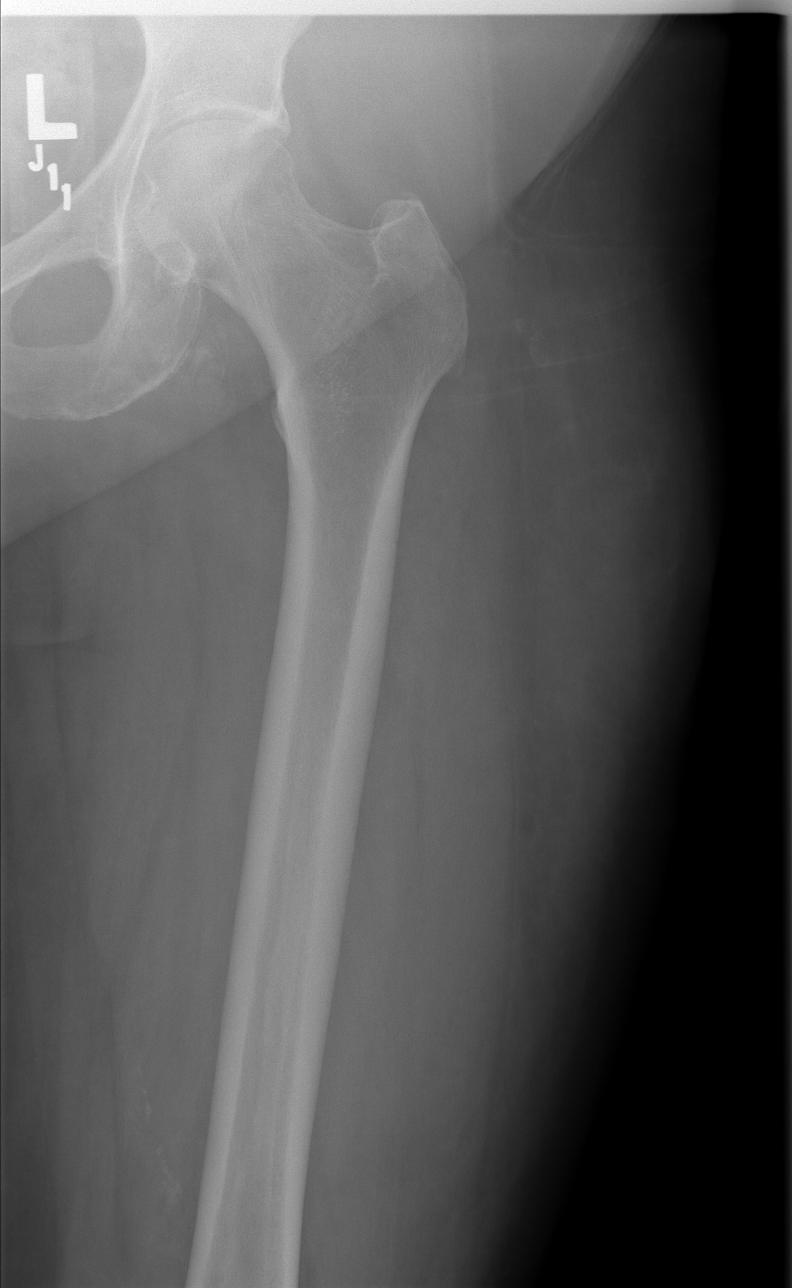

[t femur with knee ap left]
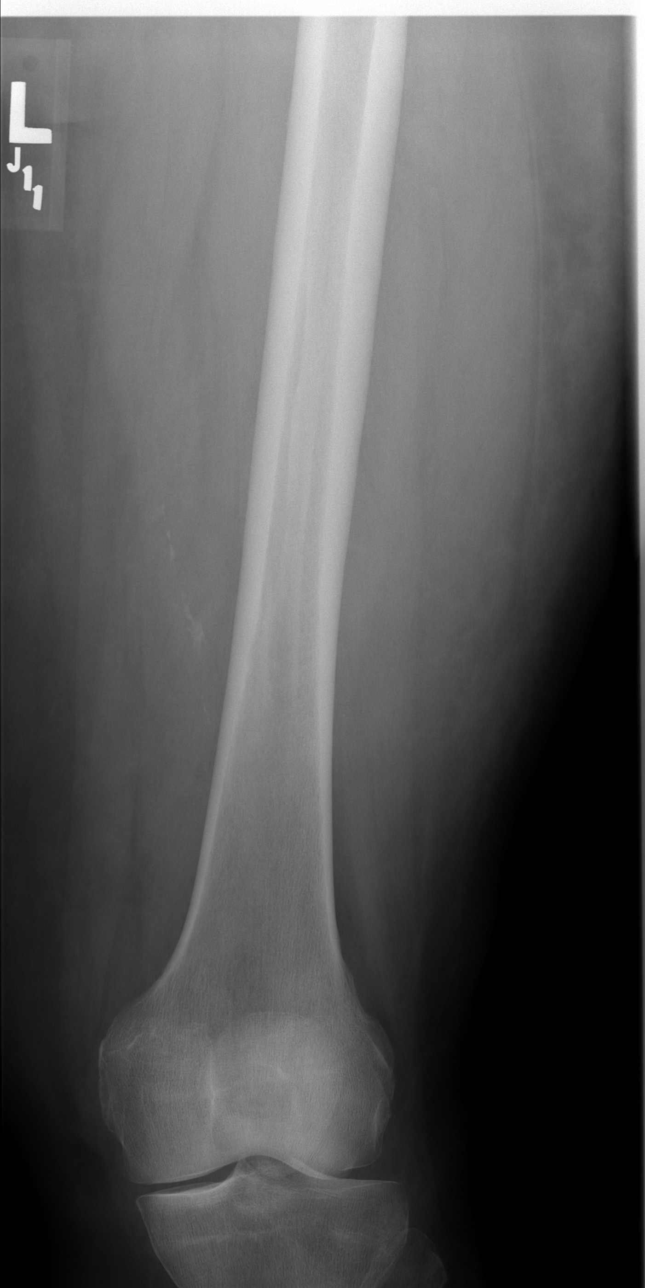

[w hip lateral left * (1 of 2)]
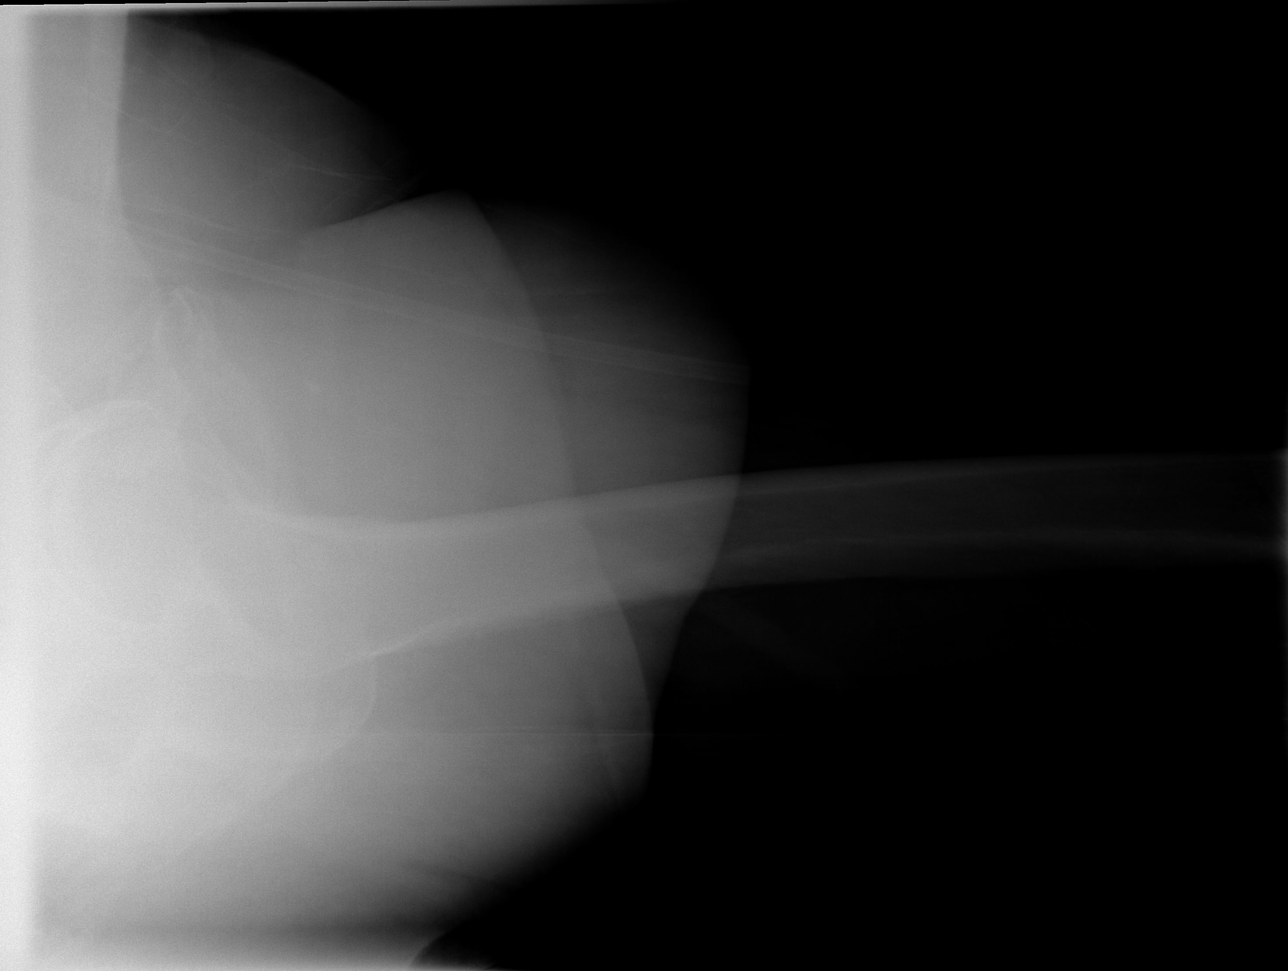

[w hip lateral left * (2 of 2)]
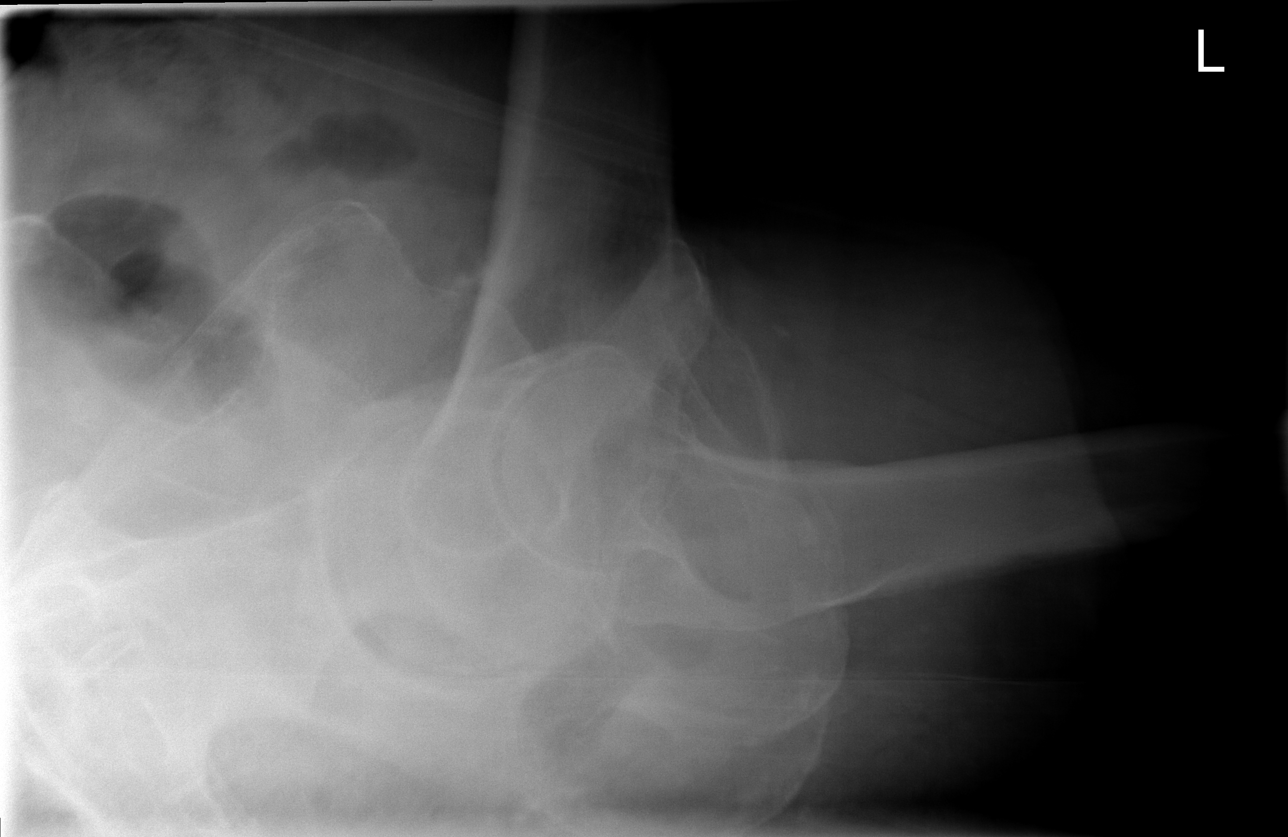

[view not recorded]
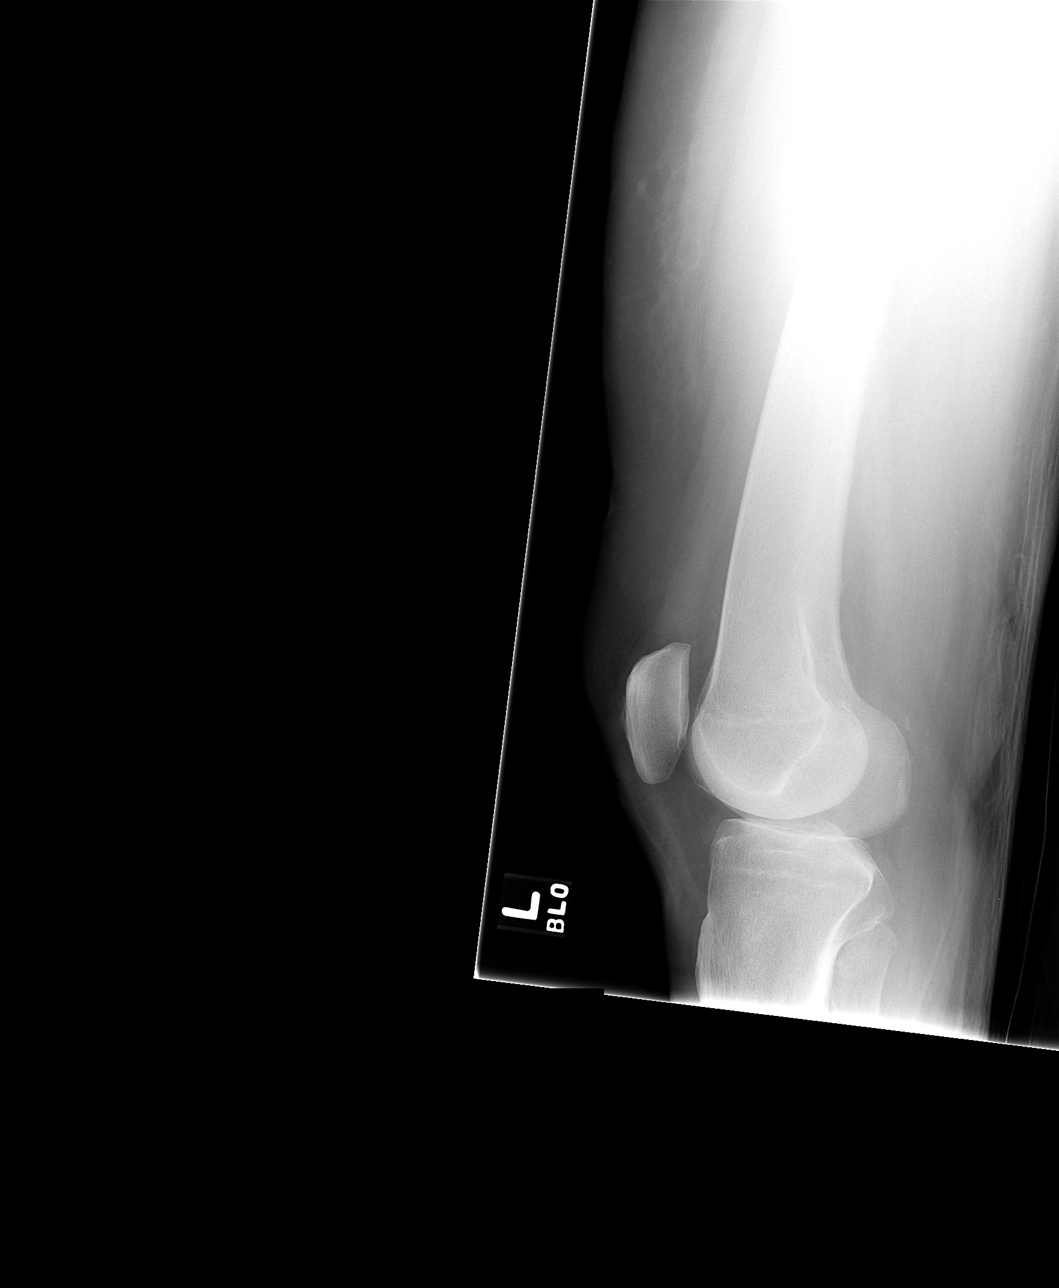

[5 of 5 positions shown; findings below may reference images not displayed]

FINDINGS: There is no evidence of acute fracture, subluxation, or dislocation.  Degenerative changes in the lower lumbar spine and minimal degenerative changes in the hips are noted.
IMPRESSION: No acute abnormalities.
 LUMBAR SPINE ? 5 VIEW:
FINDINGS: Five non-rib-bearing lumbar type vertebrae are again noted in normal alignment with remote 20% L2 superior end-plate compression fracture.  There is moderate degenerative disk disease and spondylosis throughout the lumbar spine as well as diffuse osteopenia again noted.  No significant changes are present from the prior study.
IMPRESSION: 1.  No acute abnormalities.
 2.  Stable L2 compression fracture and moderate degenerative disk disease and spondylosis.
 LEFT FEMUR ? 2 VIEW:
FINDINGS: There is no evidence of fracture or dislocation. No other soft tissue or bone abnormalities are identified.
IMPRESSION: Negative.

## 2007-03-16 IMAGING — CT CT CERVICAL SPINE W/O CM
3 of 4 series · 16 of 33 positions shown, 19 images · non-contrast
Comparison: none

CLINICAL DATA: Recent fall.
CERVICAL SPINE CT WITHOUT CONTRAST ? 02/11/07:
TECHNIQUE: Multidetector CT imaging of the cervical spine was performed according to standard protocol. Multiplanar CT image reconstructions were also generated.

[Series 4: c_spine 1.0 b20s · axial · 0.23mm/px · z∈[-301,-183]mm · 8 of 207 slices shown, 10 images]
[im 19/207  soft-tissue]
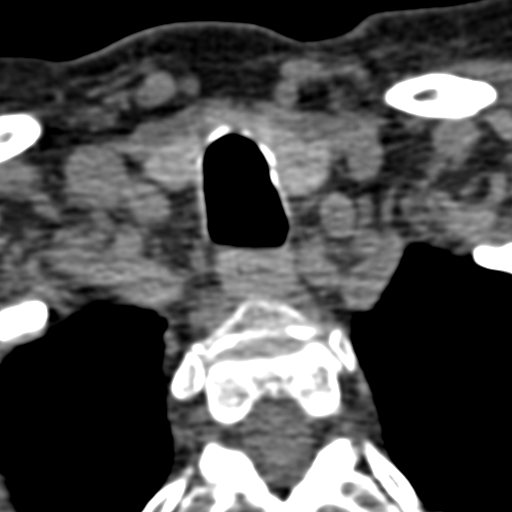
[im 19/207  bone]
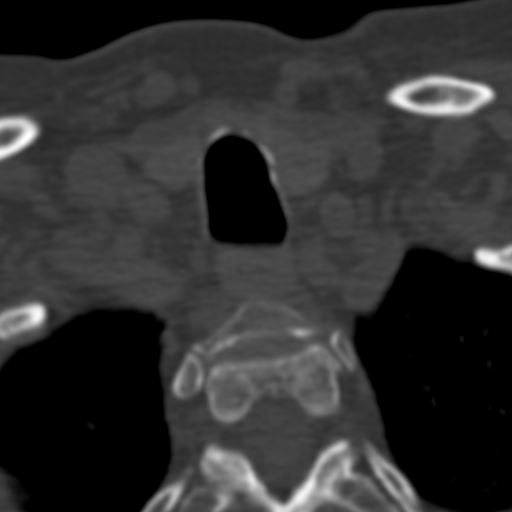
[im 38/207  bone]
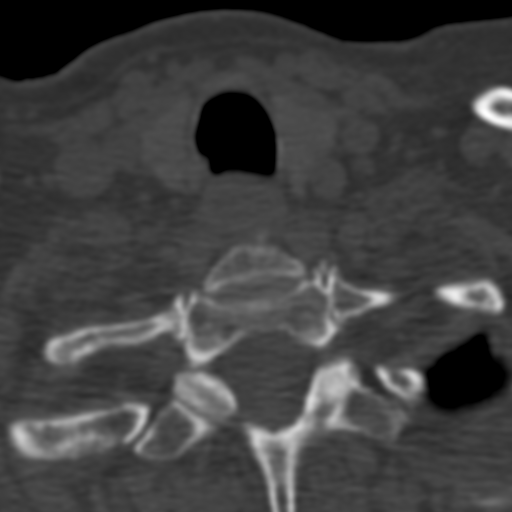
[im 75/207  bone]
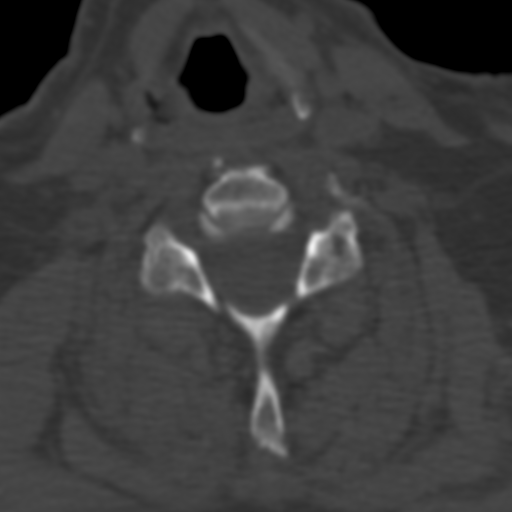
[im 94/207  bone]
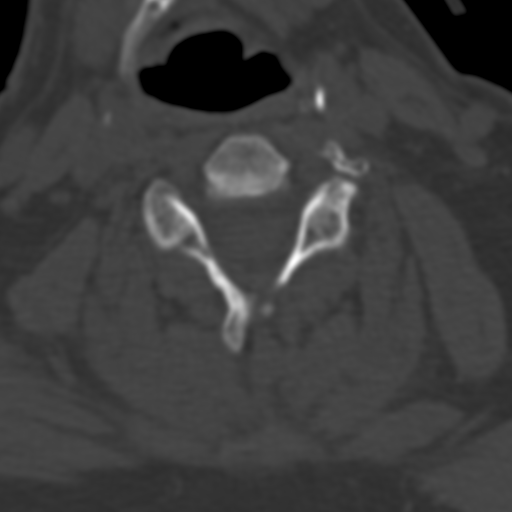
[im 113/207  soft-tissue]
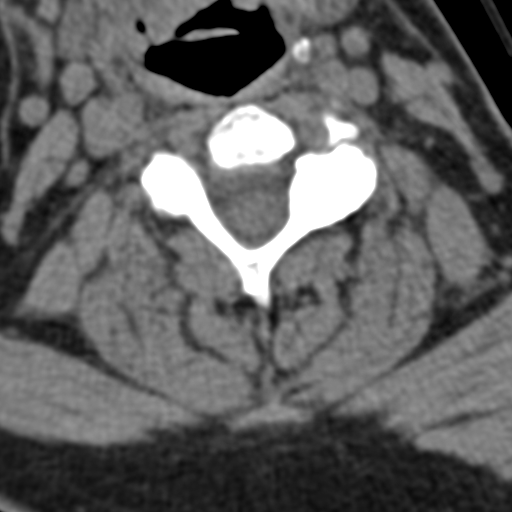
[im 113/207  bone]
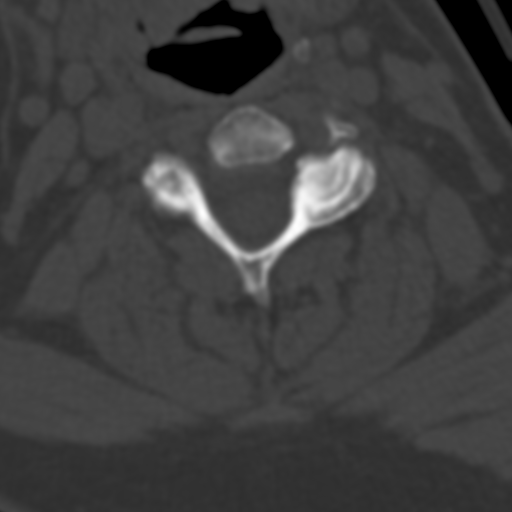
[im 132/207  bone]
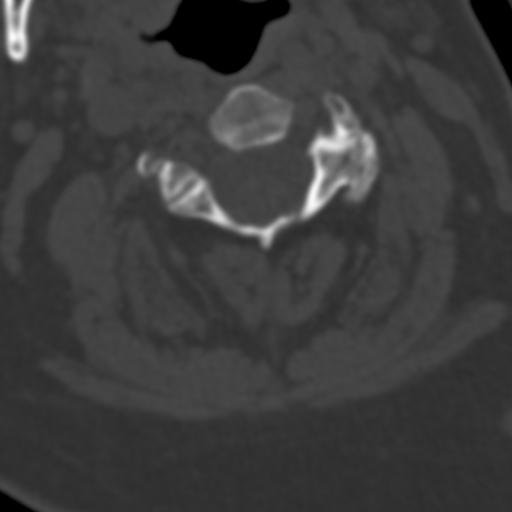
[im 169/207  bone]
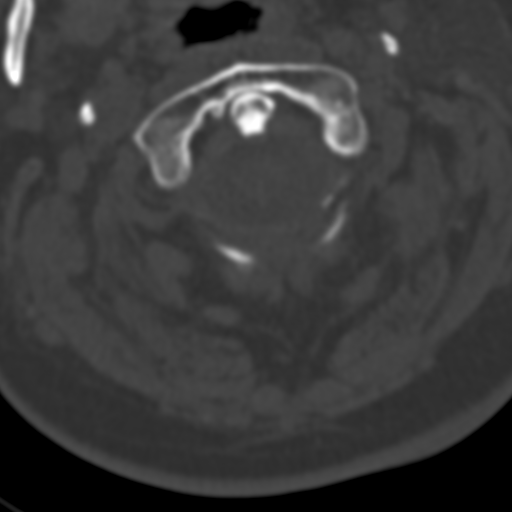
[im 188/207  bone]
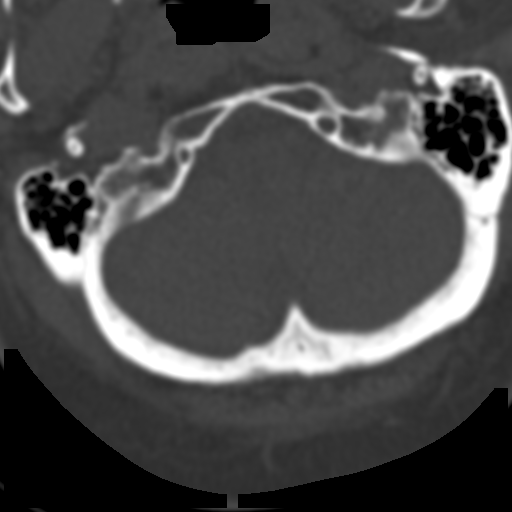

[Series 602: coronal cervical · sagittal · 0.28mm/px · 5 of 32 slices shown, 6 images]
[im 11/32  bone]
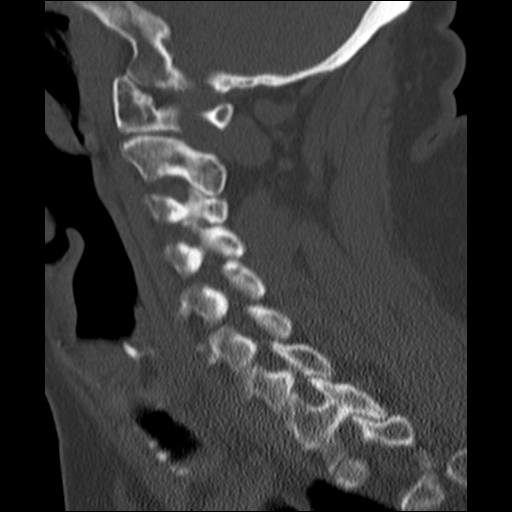
[im 13/32  bone]
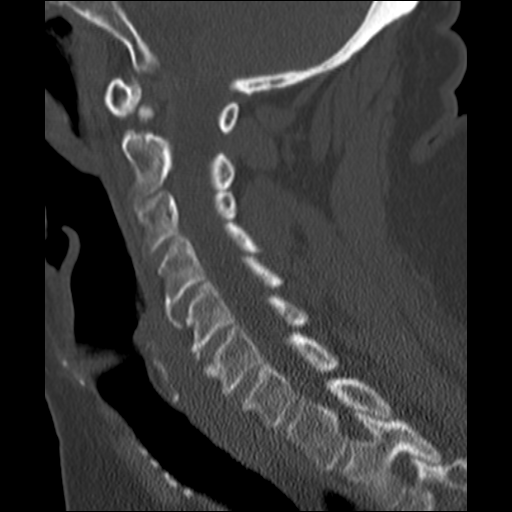
[im 16/32  soft-tissue]
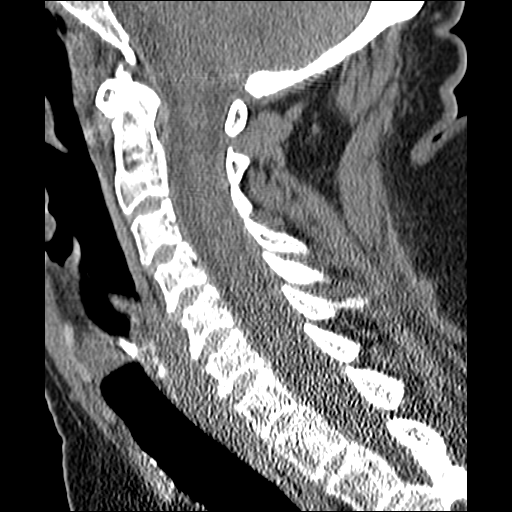
[im 16/32  bone]
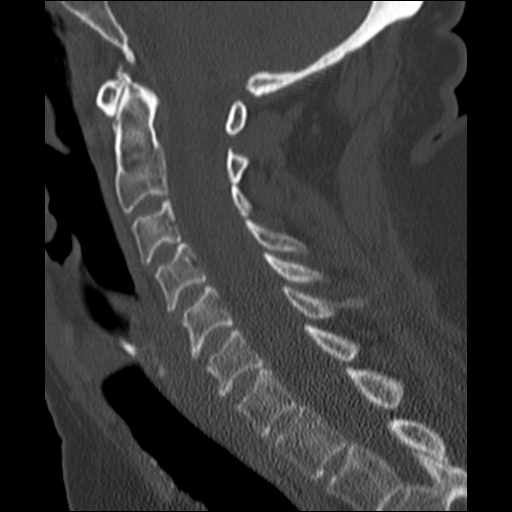
[im 19/32  bone]
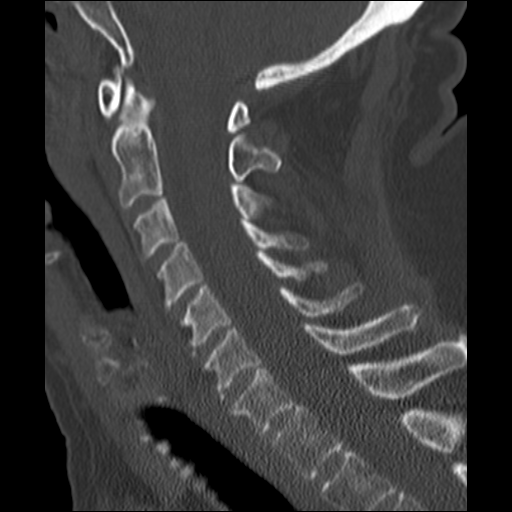
[im 21/32  bone]
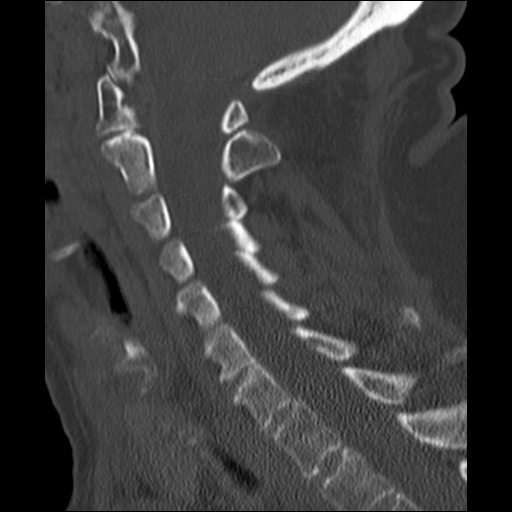

[Series 603: saggital cervical · coronal · 0.28mm/px · 3 of 32 slices shown]
[im 7/32  bone]
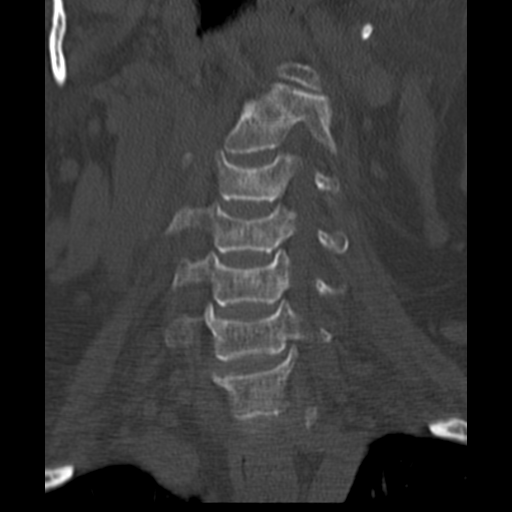
[im 13/32  bone]
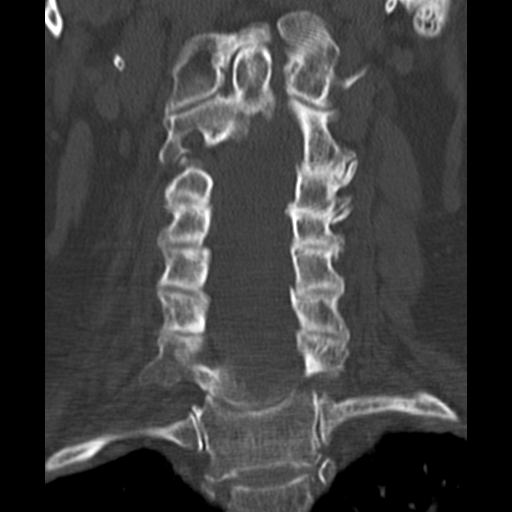
[im 19/32  bone]
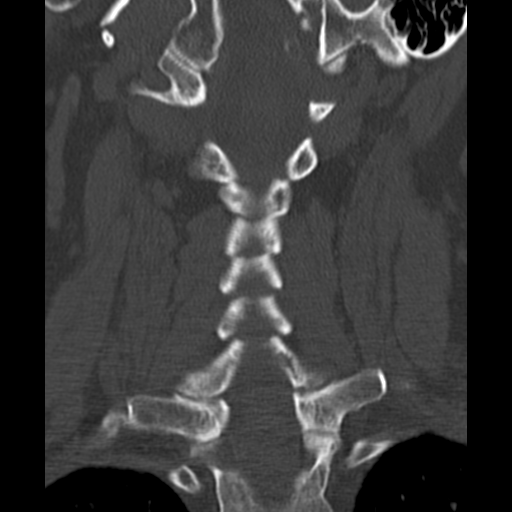

[16 of 33 positions shown; findings below may reference images not displayed]

FINDINGS: There is degenerative change involving the facet joints.  However, no acute fracture is seen.  The posterior elements are intact.  The cervical vertebrae are in normal alignment.  The odontoid process is intact.
IMPRESSION: No acute fracture.  Normal alignment.  Degenerative change involving the facet joints.

## 2007-03-22 IMAGING — CR DG CHEST 2V
2 series · 2 of 2 positions shown · non-contrast
Comparison: 02/05/07.

CLINICAL DATA: Shortness of breath, dizzy. Ex-smoker.
 CHEST - 2 VIEW:

[w chest lat]
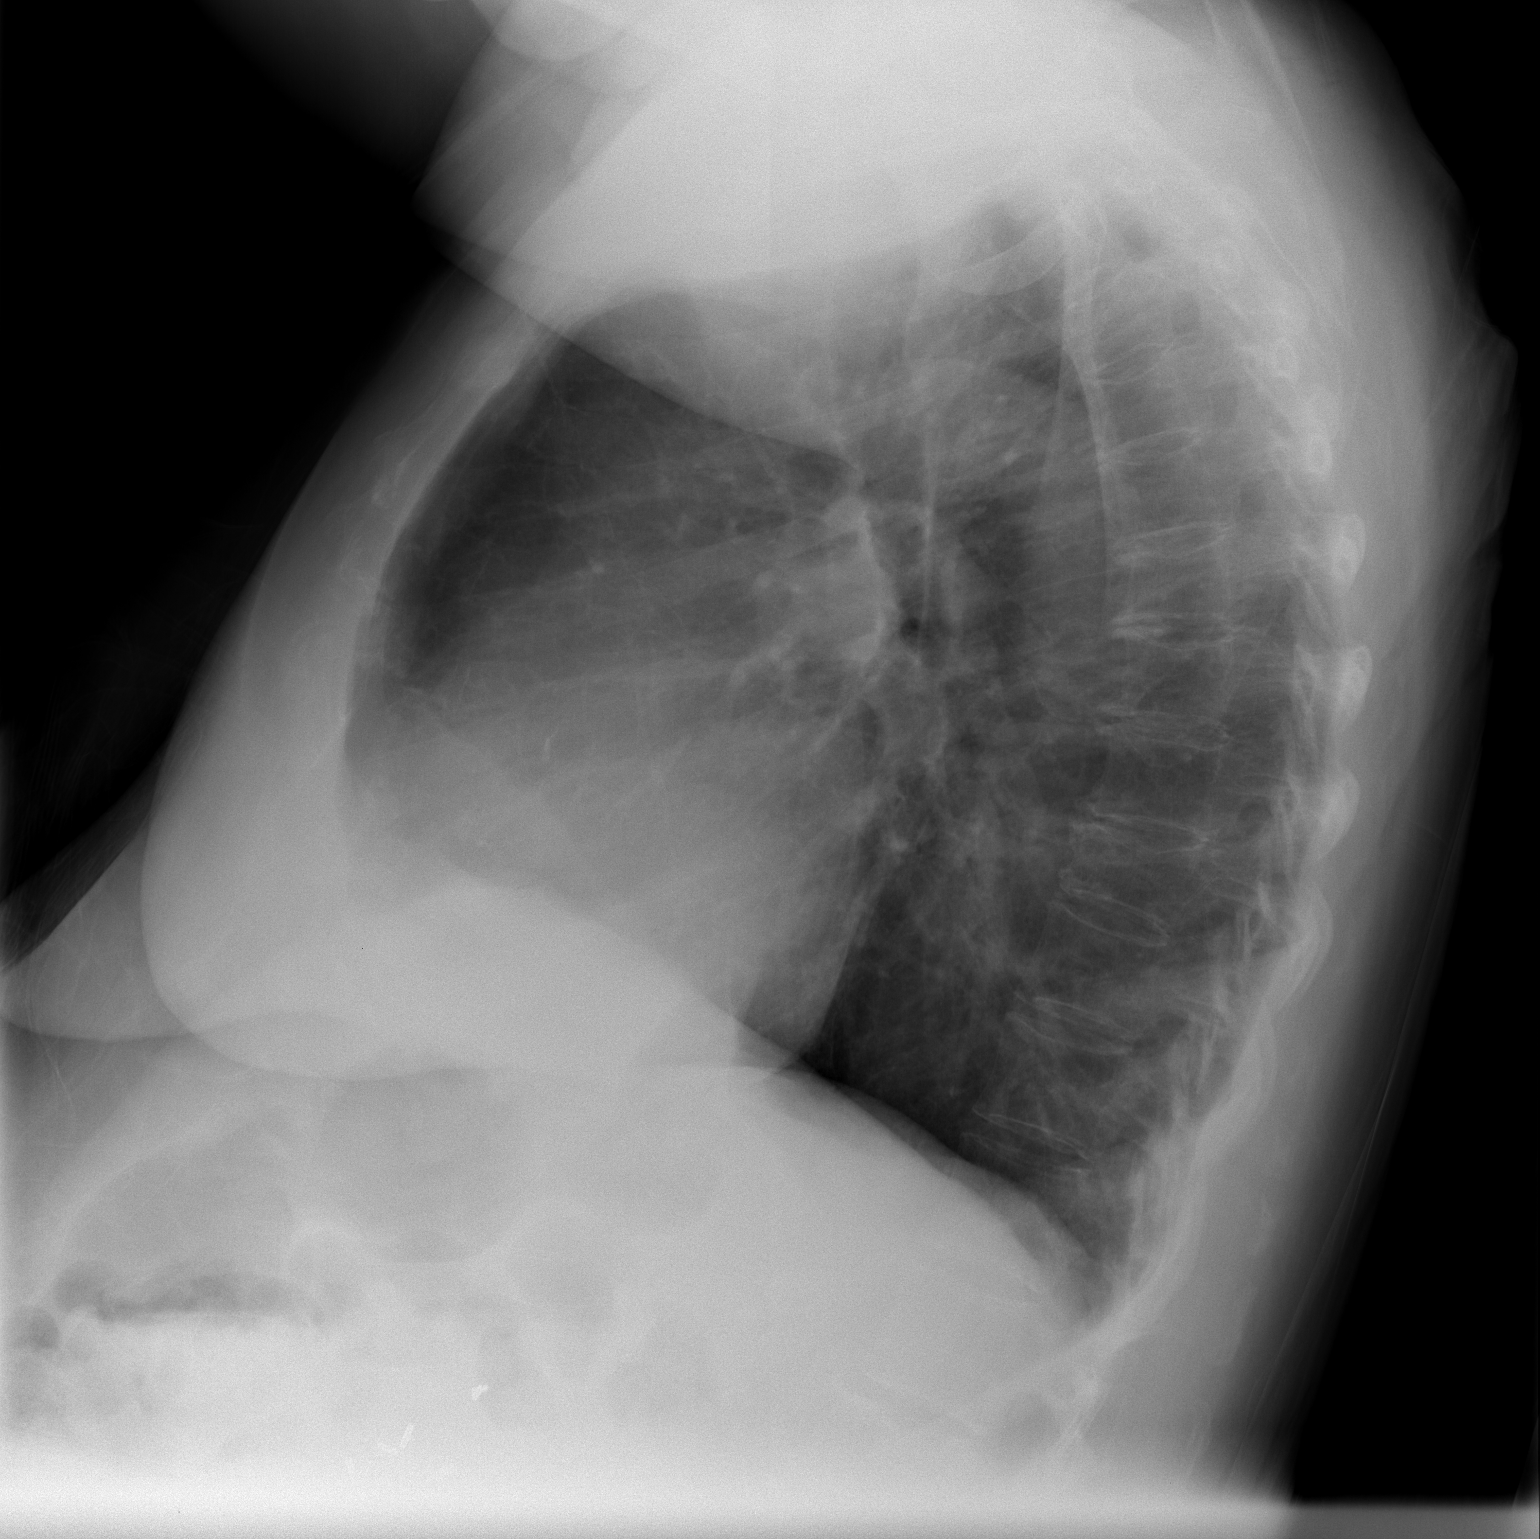

[view not recorded]
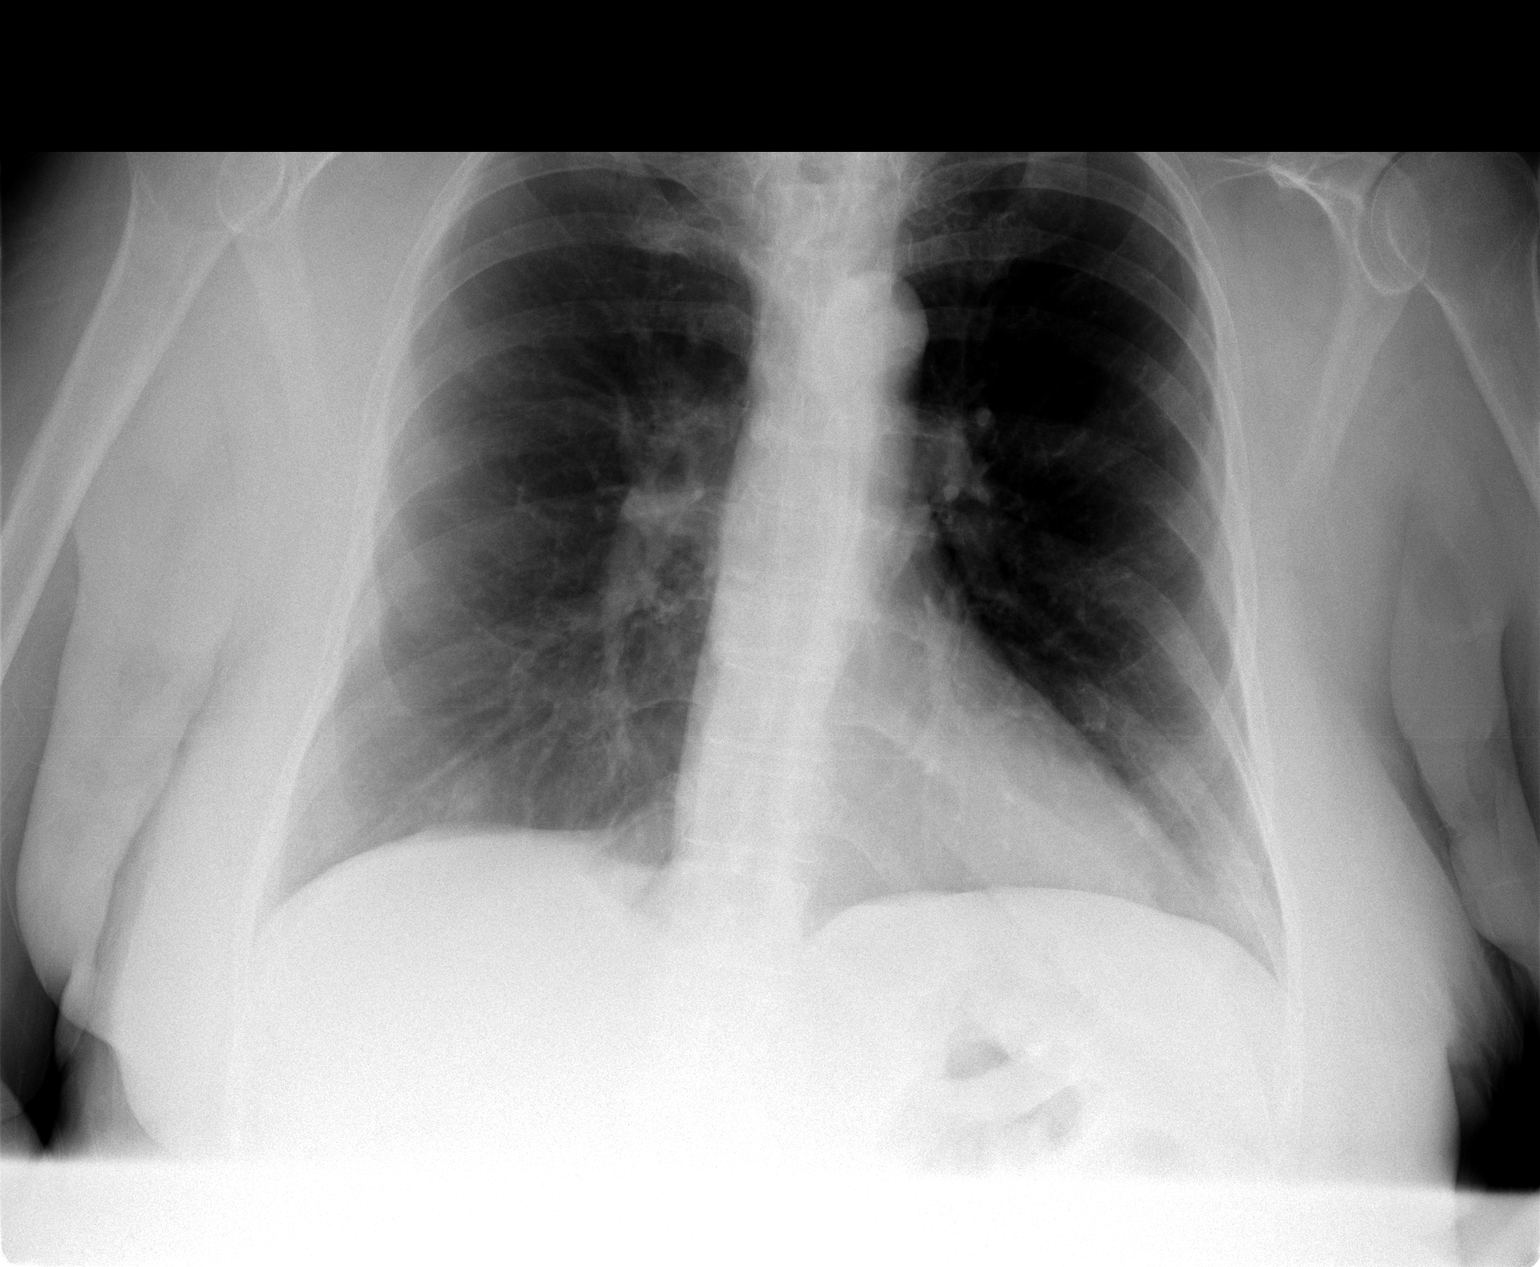

[2 of 2 positions shown; findings below may reference images not displayed]

FINDINGS: Changes of COPD persist.  Cardiac size is normal.  There are no infiltrates, failure or pneumothorax. The bones are unremarkable. There is no change from the prior examination.
IMPRESSION: No active disease.

## 2007-03-22 IMAGING — CT CT ANGIO CHEST
1 of 2 series · 19 of 30 positions shown · IV contrast (omnipaque)
Comparison: 12/01/06.

CLINICAL DATA: CT ANGIOGRAPHY OF CHEST (PULMONARY EMBOLISM PROTOCOL):
TECHNIQUE: Multidetector CT imaging of the chest was performed according to the protocol for detection of pulmonary embolism during bolus injection of intravenous contrast.  Coronal and sagittal plane CT angiographic image reconstructions were also generated.
Contrast:  100 cc Omnipaque 300.

[Series 5: pe 1.0 b20f st · axial · 0.70mm/px · z∈[+1002,+1273]mm · 19 of 303 slices shown]
[im 16/303  lung]
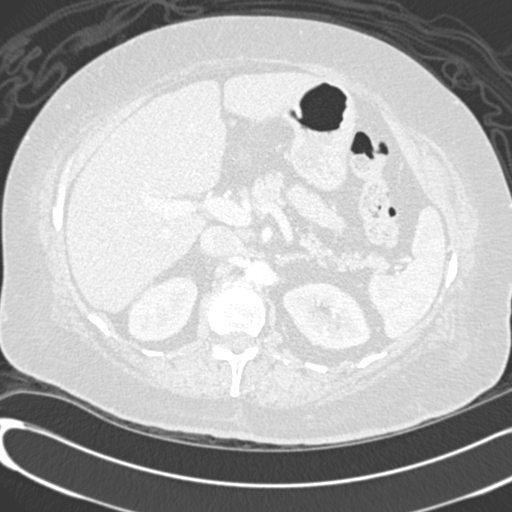
[im 31/303  mediastinal]
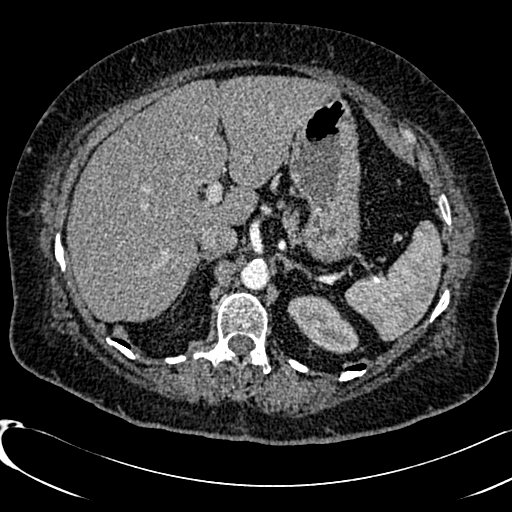
[im 46/303  lung]
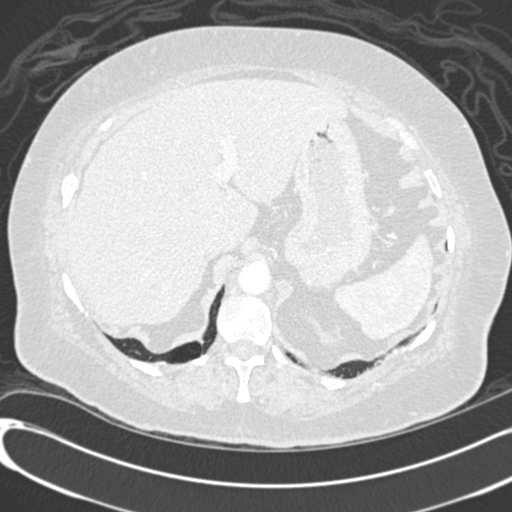
[im 61/303  mediastinal]
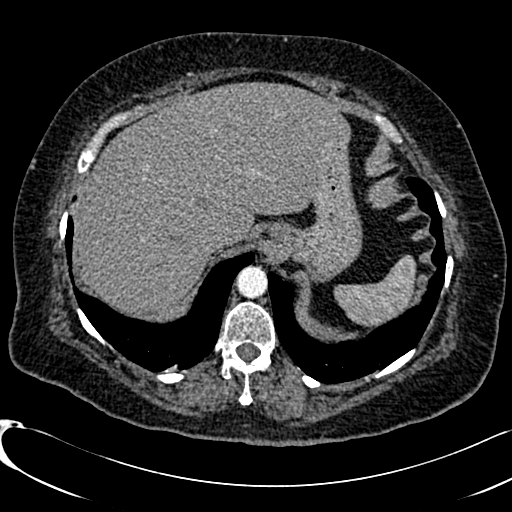
[im 76/303  lung]
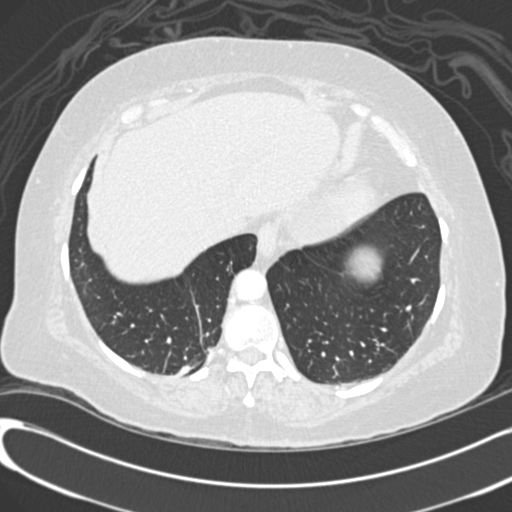
[im 91/303  mediastinal]
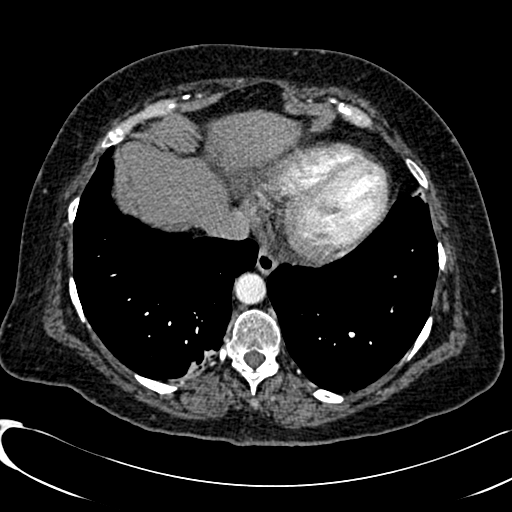
[im 106/303  lung]
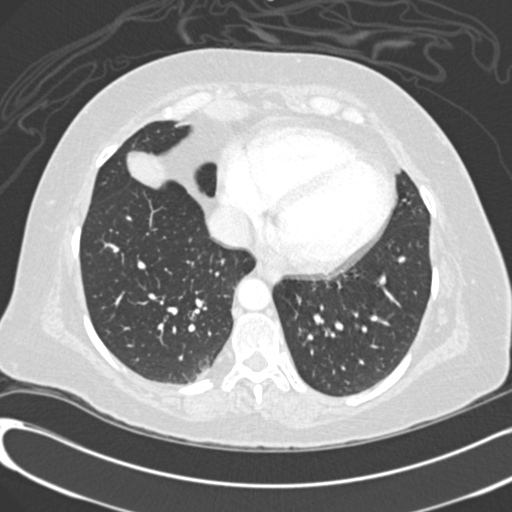
[im 121/303  mediastinal]
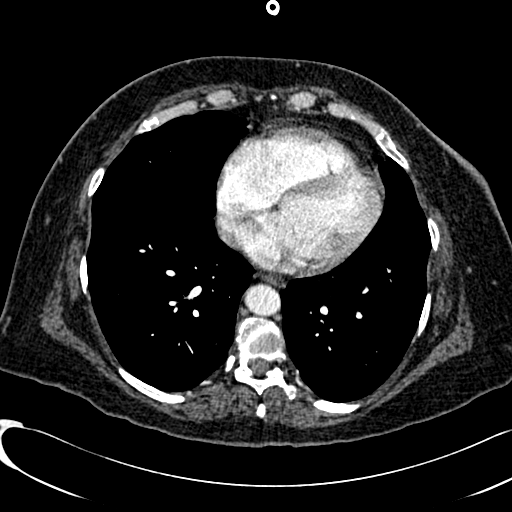
[im 136/303  lung]
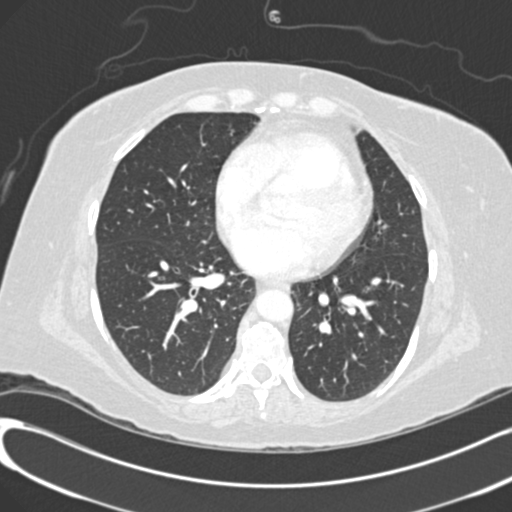
[im 152/303  mediastinal]
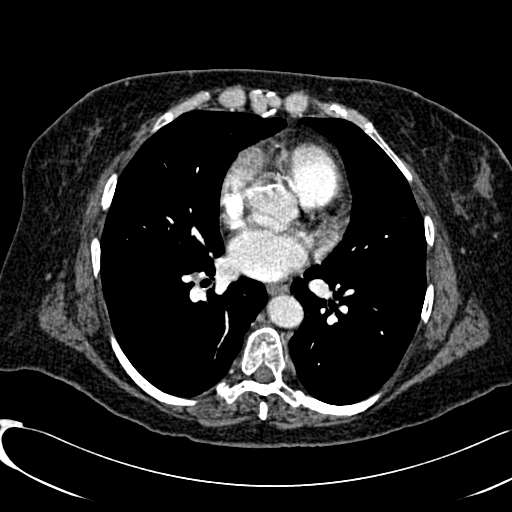
[im 167/303  lung]
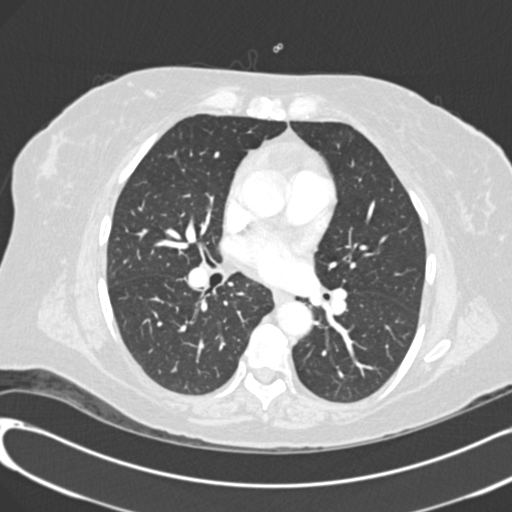
[im 182/303  mediastinal]
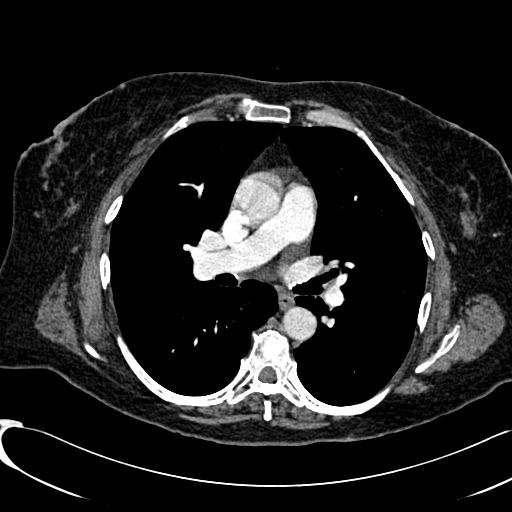
[im 197/303  lung]
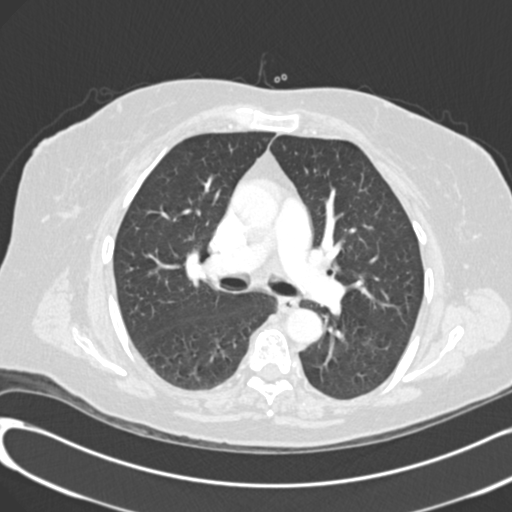
[im 212/303  mediastinal]
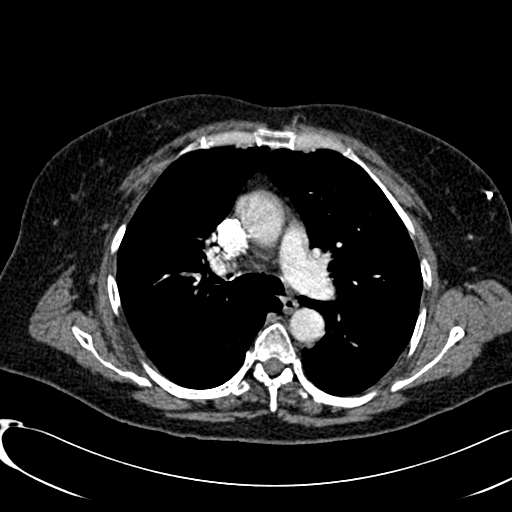
[im 227/303  lung]
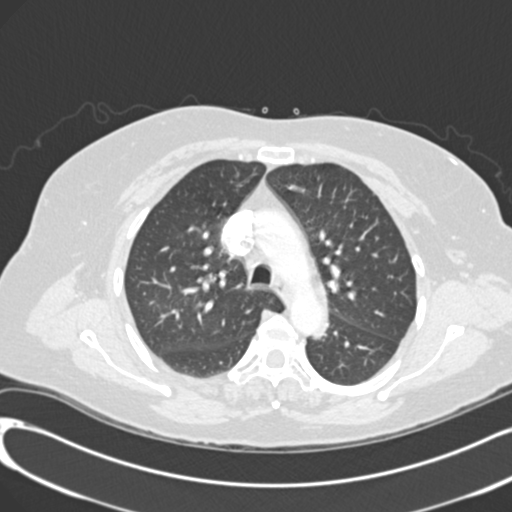
[im 242/303  mediastinal]
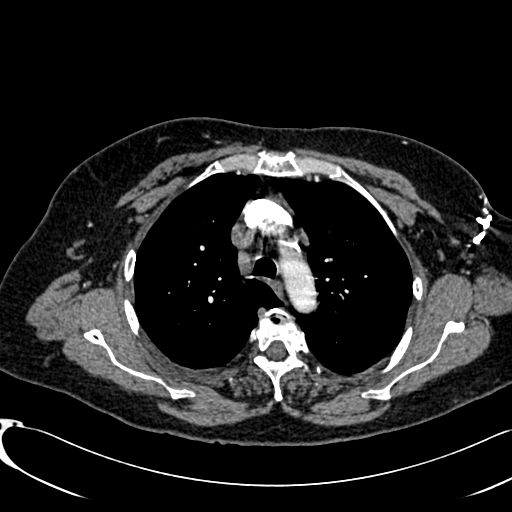
[im 257/303  lung]
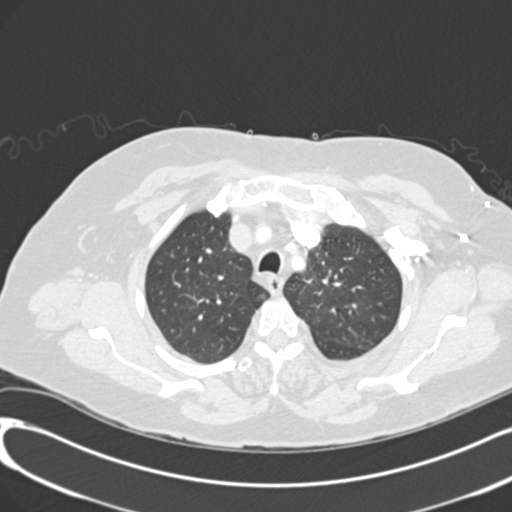
[im 272/303  mediastinal]
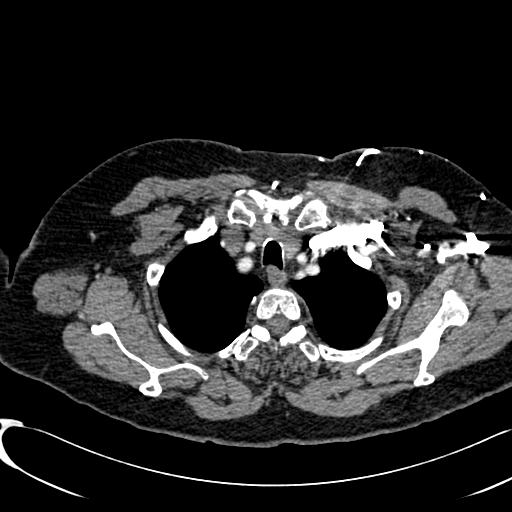
[im 287/303  lung]
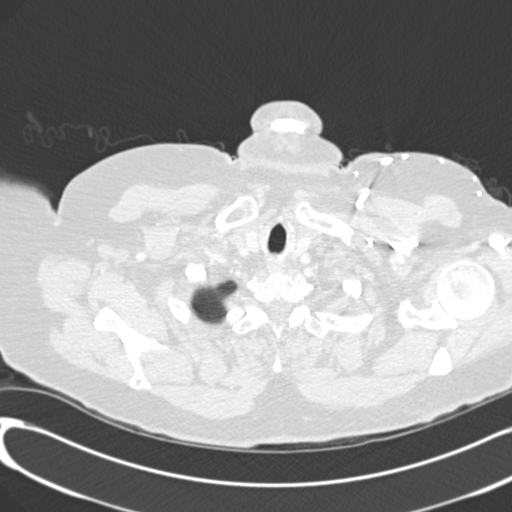

[19 of 30 positions shown; findings below may reference images not displayed]

FINDINGS: No evidence of pulmonary embolism involving main pulmonary arteries or adjacent segmental branches.  There has been partial clearing of pulmonary parenchymal changes along the right lung base with a 3.1 x 1.1 cm pleural-based opacity remaining (series 6, image 69).  Minimal atelectatic changes left base. Follow-up chest CT in 3 months recommended for further delineation.  Atherosclerotic type changes. Minimal amount of pericardial fluid.  No mediastinal hilar or axillary adenopathy. Degenerative changes with small Schmorl?s node deformities and mild loss of height mid thoracic vertebra appear similar to prior exam. There is also prominent Schmorl?s node from a superior L-1 endplate. Upper abdominal structures which are visualized appear unremarkable with the exception of atherosclerotic type changes and degenerative disk disease.
IMPRESSION: 1.  No pulmonary embolus involving main pulmonary arteries or adjacent segmental branches.  
2.  Partial clearing of right lower lung zone pulmonary parenchymal changes.  Follow-up exam in 3 months recommended for further delineation of remaining right base pulmonary parenchymal changes.
3.  Atherosclerotic type changes aorta and coronary arteries.

## 2007-03-24 IMAGING — CR DG CHEST 2V
2 series · 2 of 2 positions shown · non-contrast
Comparison: 02/17/2007

CLINICAL DATA: Shortness of breath and headache. Cough.

[w chest lat]
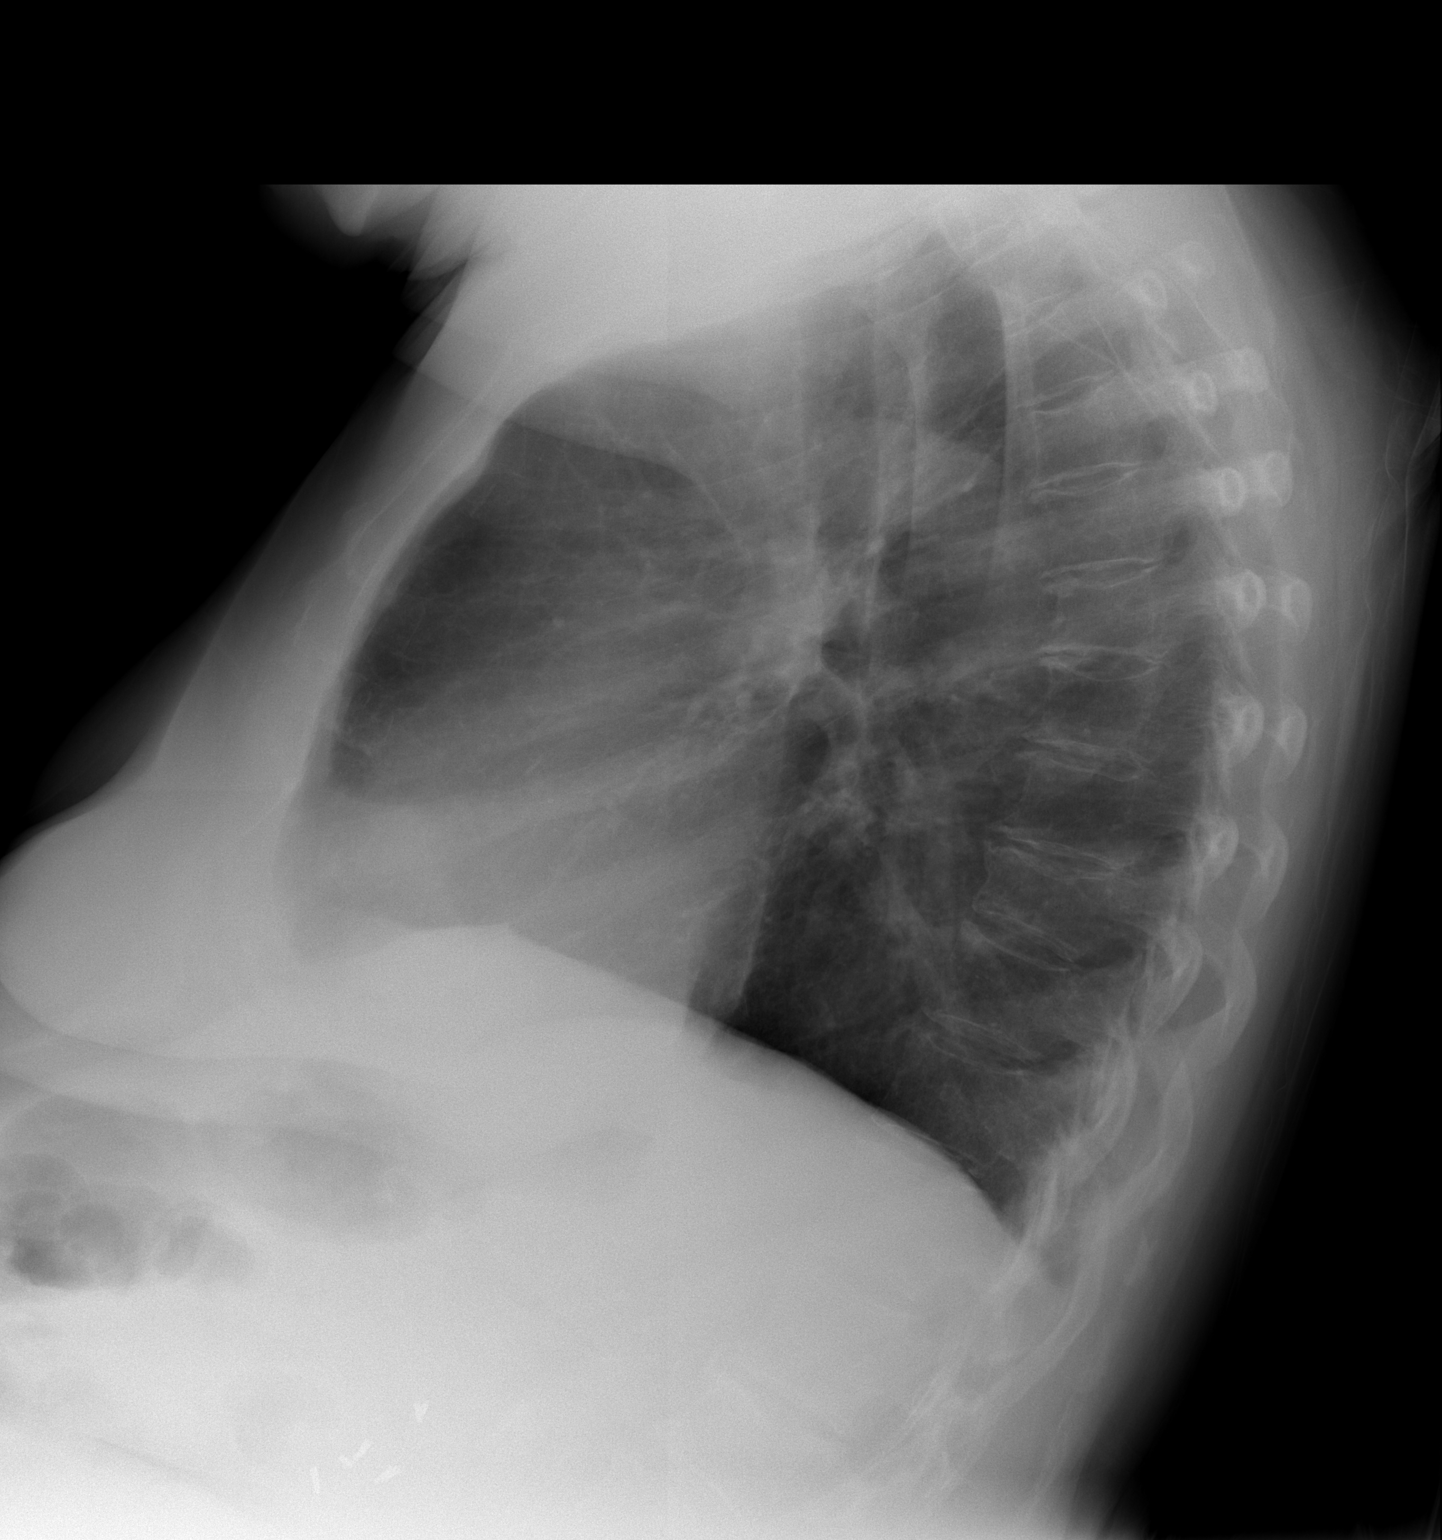

[view not recorded]
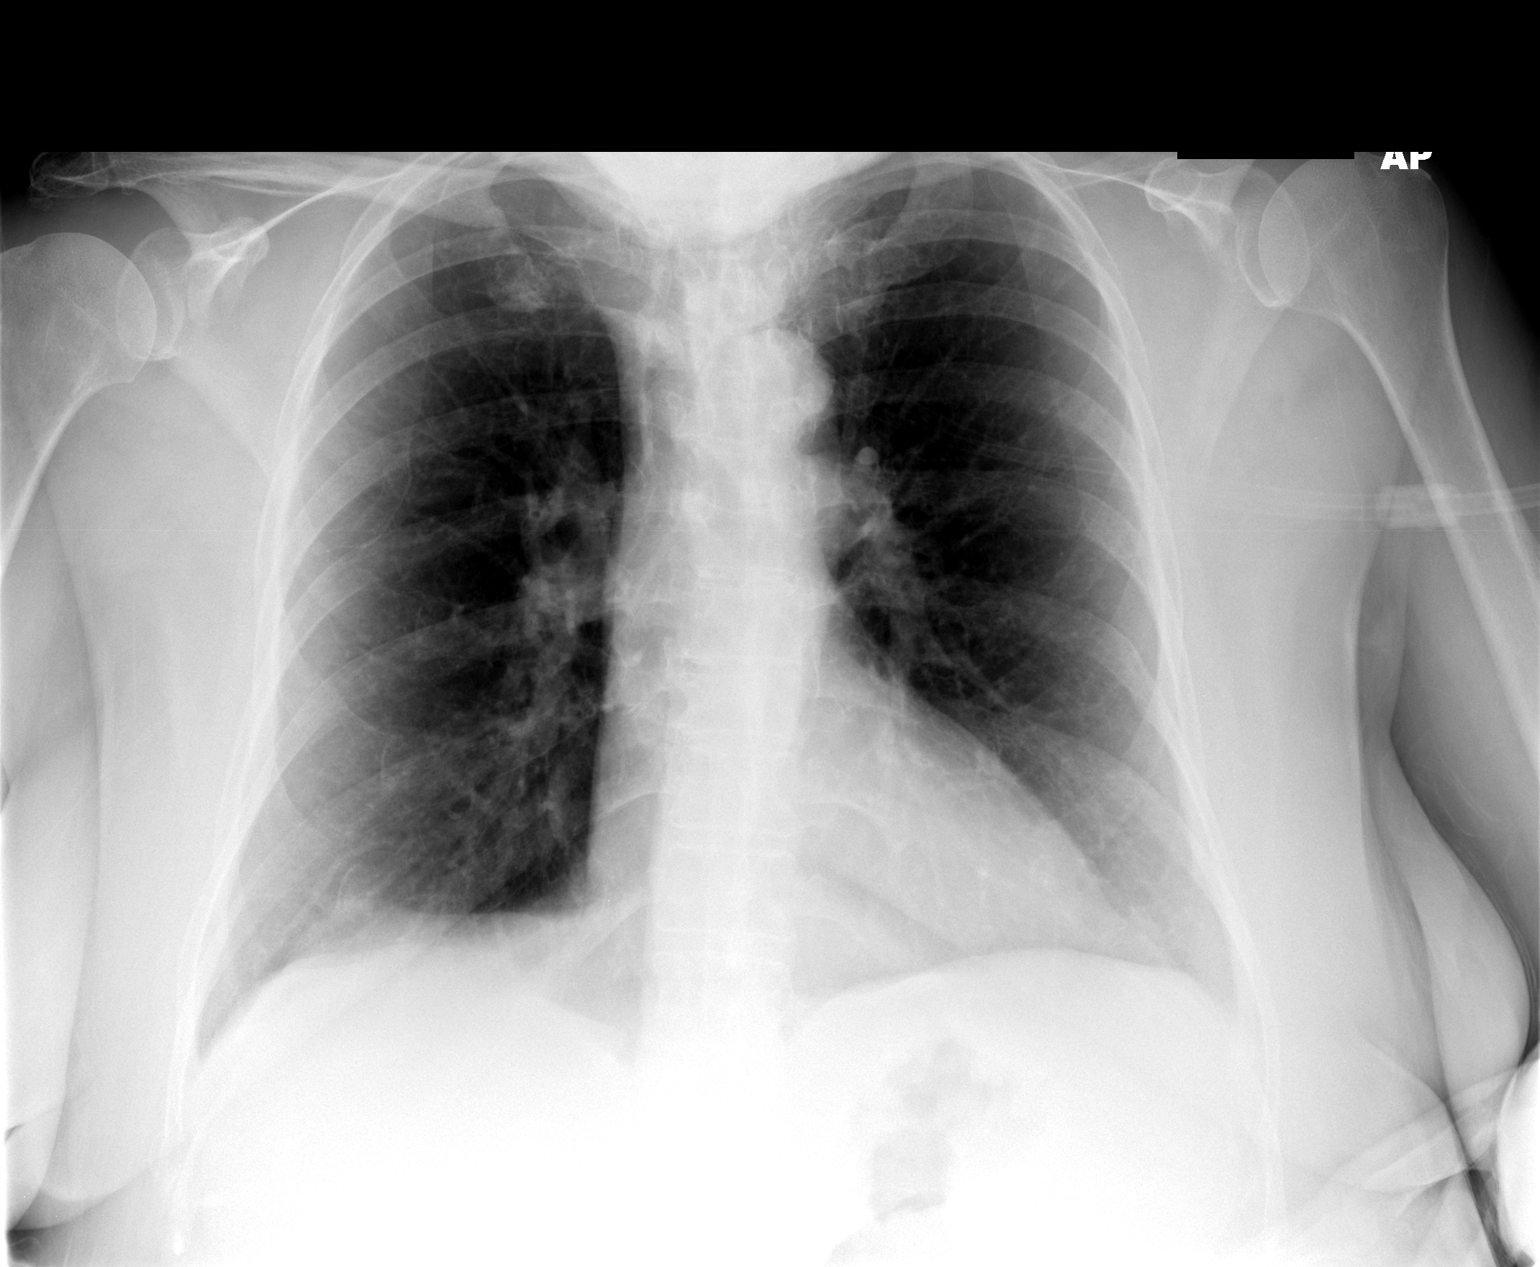

[2 of 2 positions shown; findings below may reference images not displayed]

CHEST - 2 VIEW:

Small diaphragmatic eventration noted at the dome of the right hemidiaphragm.
Cardiopericardial silhouette is at upper limits of normal for size to borderline
enlarged. Interstitial markings are diffusely coarsened. Hyperinflation is
consistent with emphysema.
IMPRESSION: Emphysema. No acute cardiopulmonary findings.

## 2007-03-24 IMAGING — CR DG SHOULDER 2+V*R*
3 series · 3 of 3 positions shown · non-contrast
Comparison: none

CLINICAL DATA: Fall.
 CERVICAL SPINE ? 2 VIEW:

[t shoulder ap internal righ]
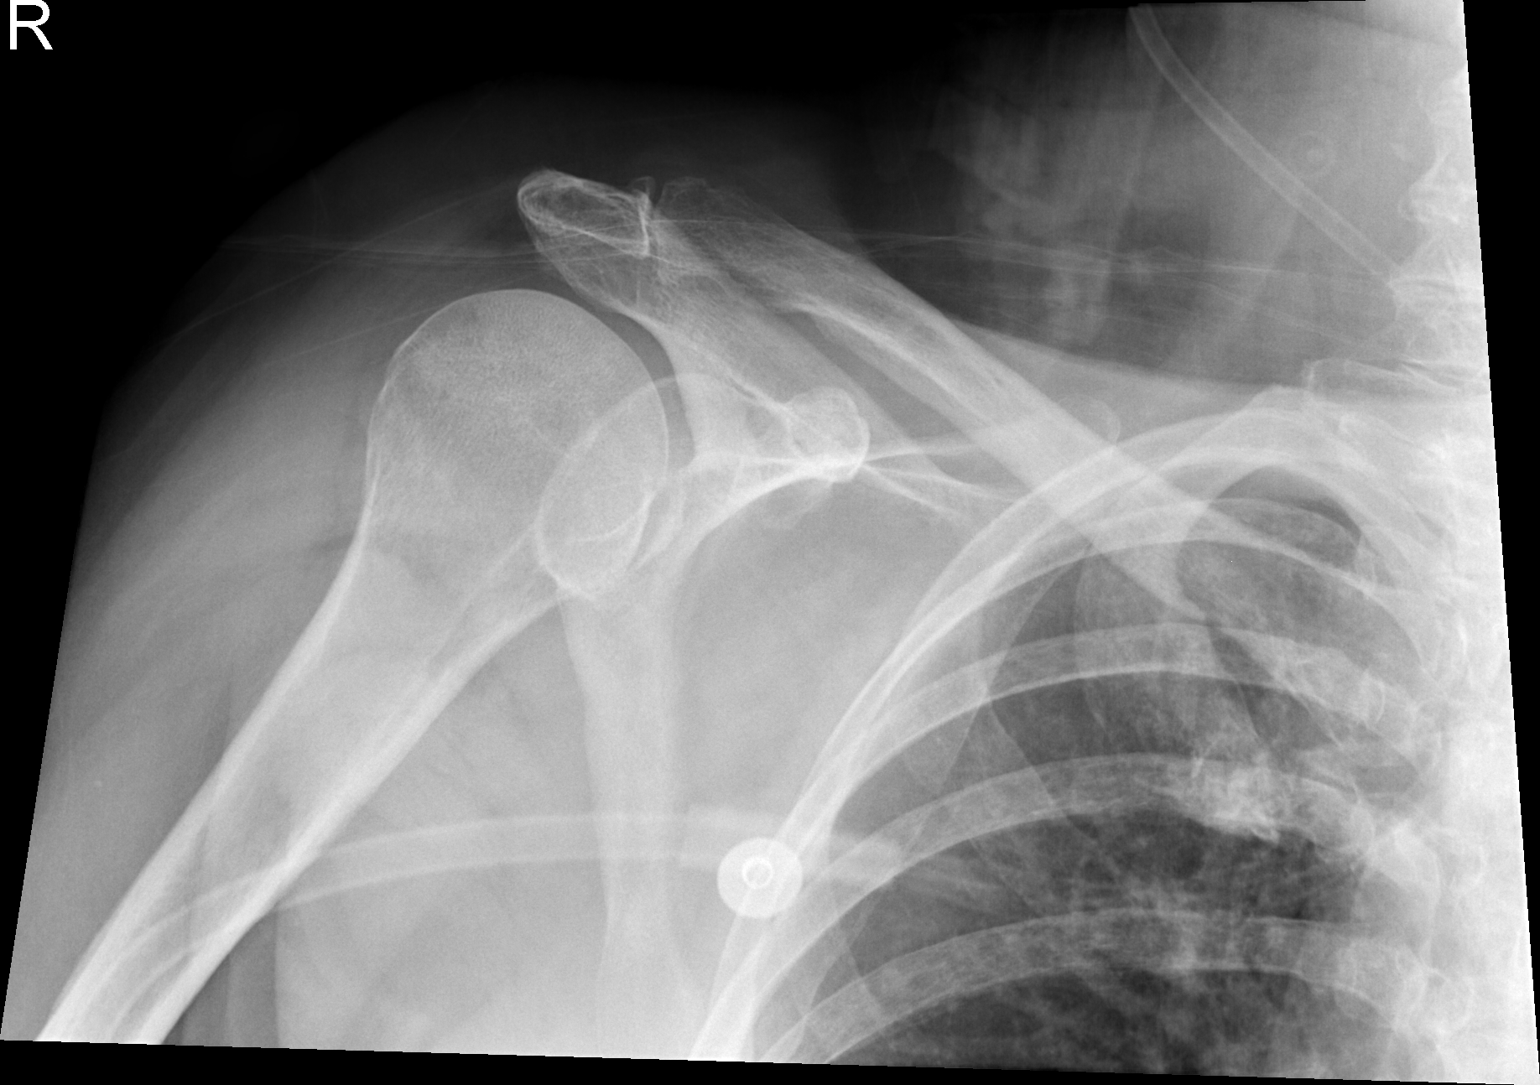

[t shoulder ap external righ]
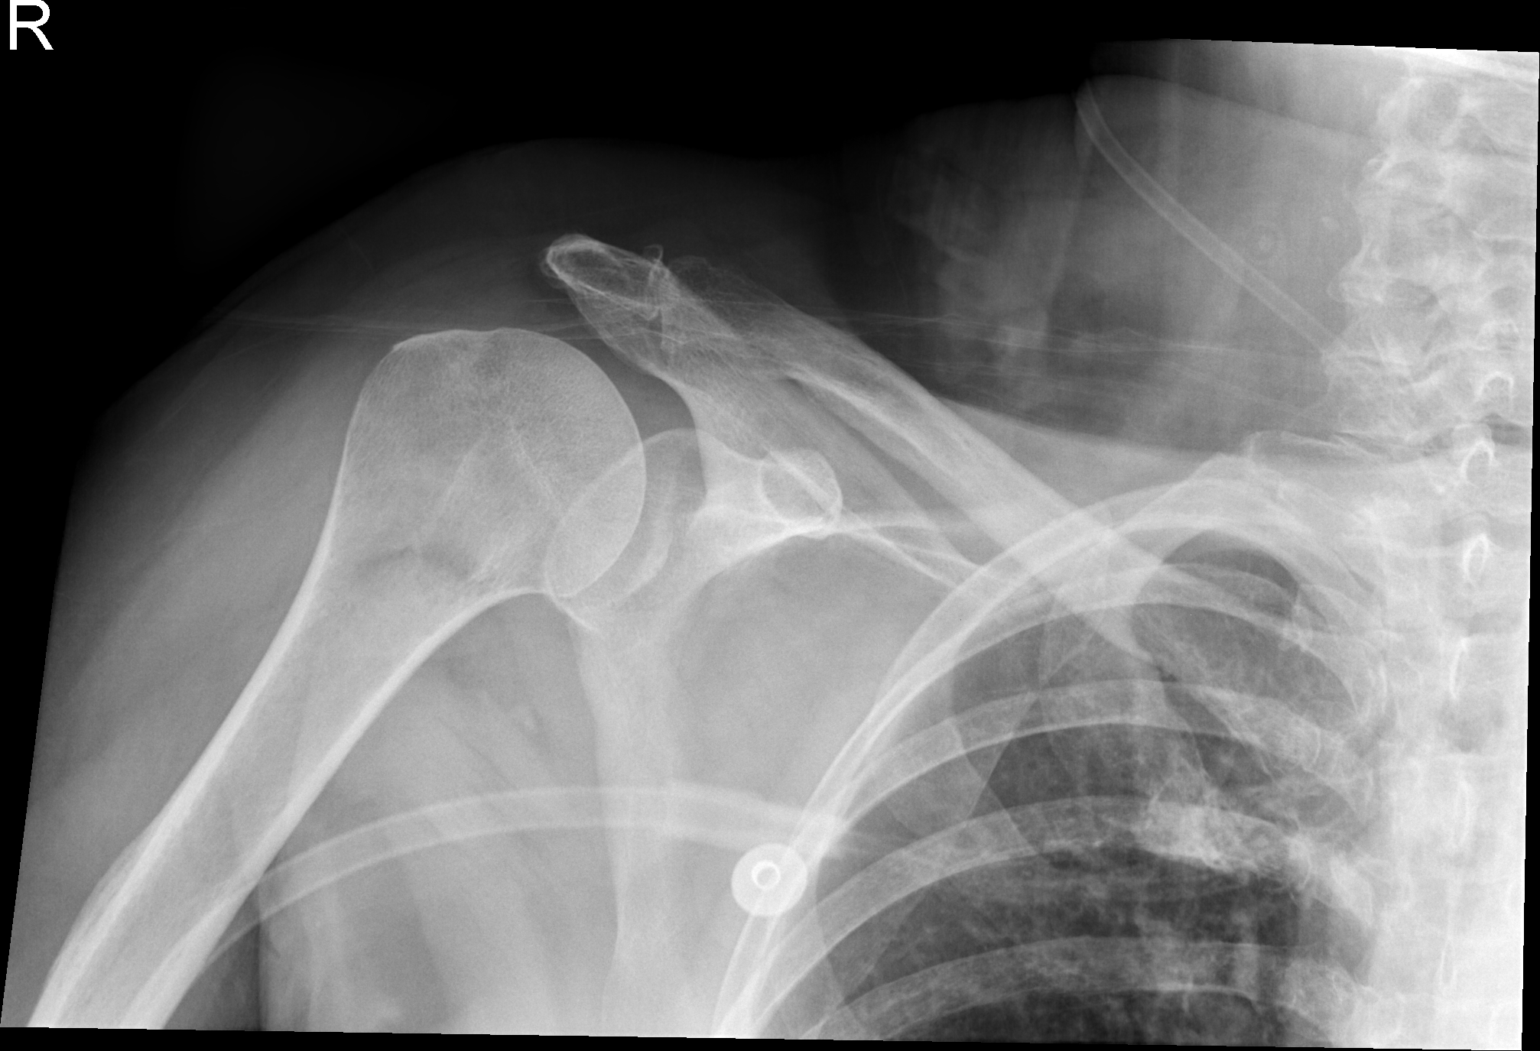

[t shoulder y view right]
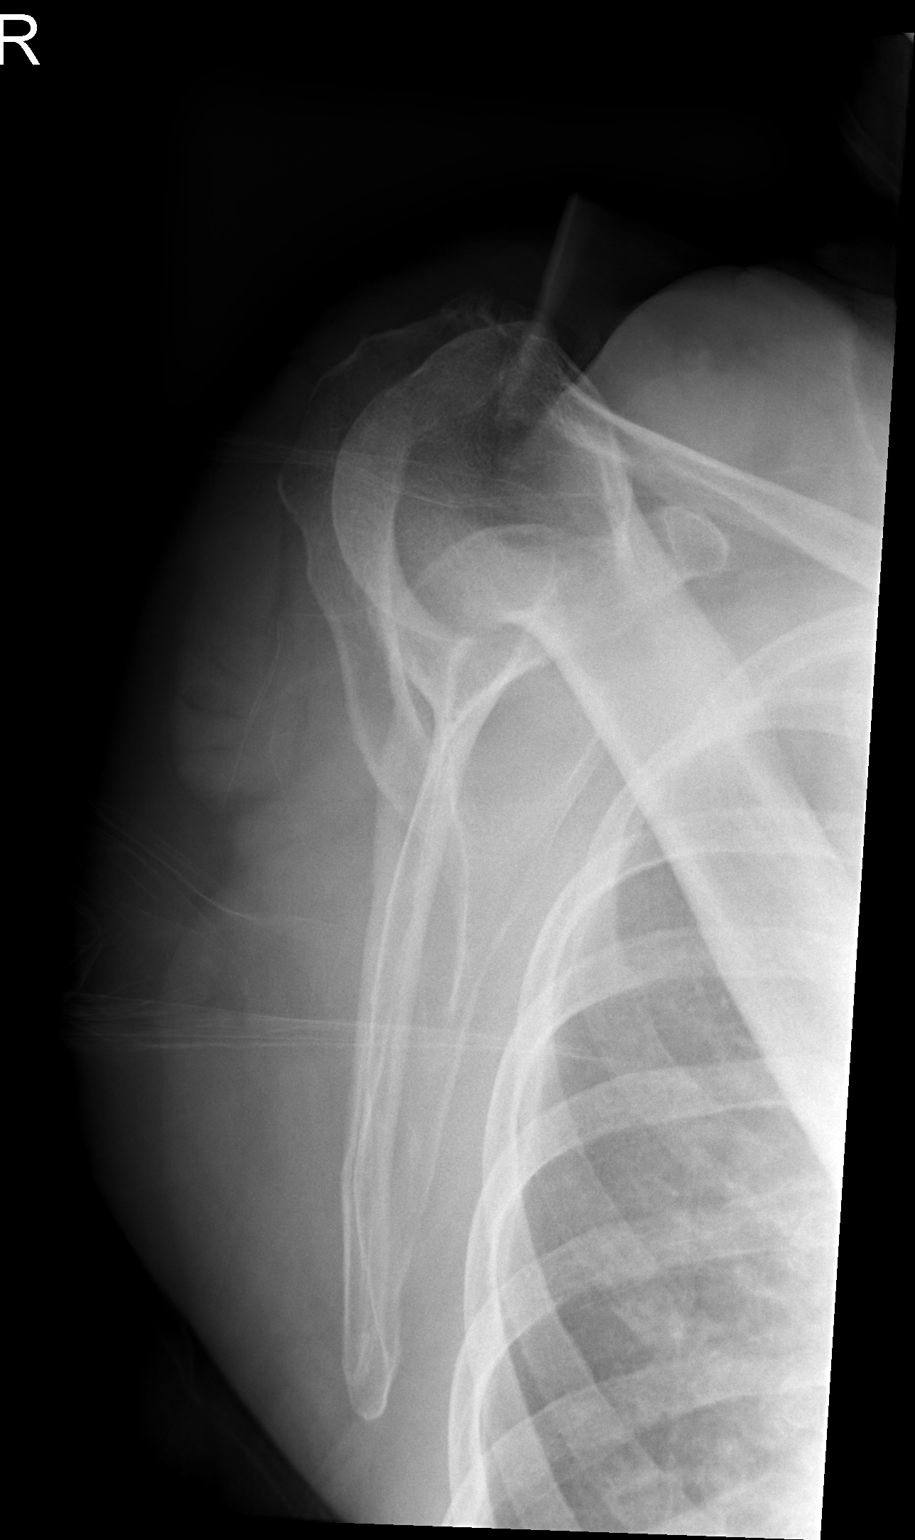

[3 of 3 positions shown; findings below may reference images not displayed]

FINDINGS: There is anatomic alignment of the vertebral bodies.  There is no vertebral body height loss.  The odontoid is intact.  Soft tissues are within normal limits.  Degenerative changes are seen throughout the cervical spine.
IMPRESSION: No obvious evidence of injury.  CT can be performed as clinically indicated. 
 RIGHT SHOULDER ? 3 VIEW:
FINDINGS: No acute fractures or dislocations are seen.  Degenerative changes of the AC joints are present with inferior osteophytes.
IMPRESSION: No acute bony injury.

## 2007-03-24 IMAGING — CR DG CERVICAL SPINE 2 OR 3 VIEWS
5 series · 5 of 5 positions shown · non-contrast
Comparison: none

CLINICAL DATA: Fall.
 CERVICAL SPINE ? 2 VIEW:

[w c-spine lat]
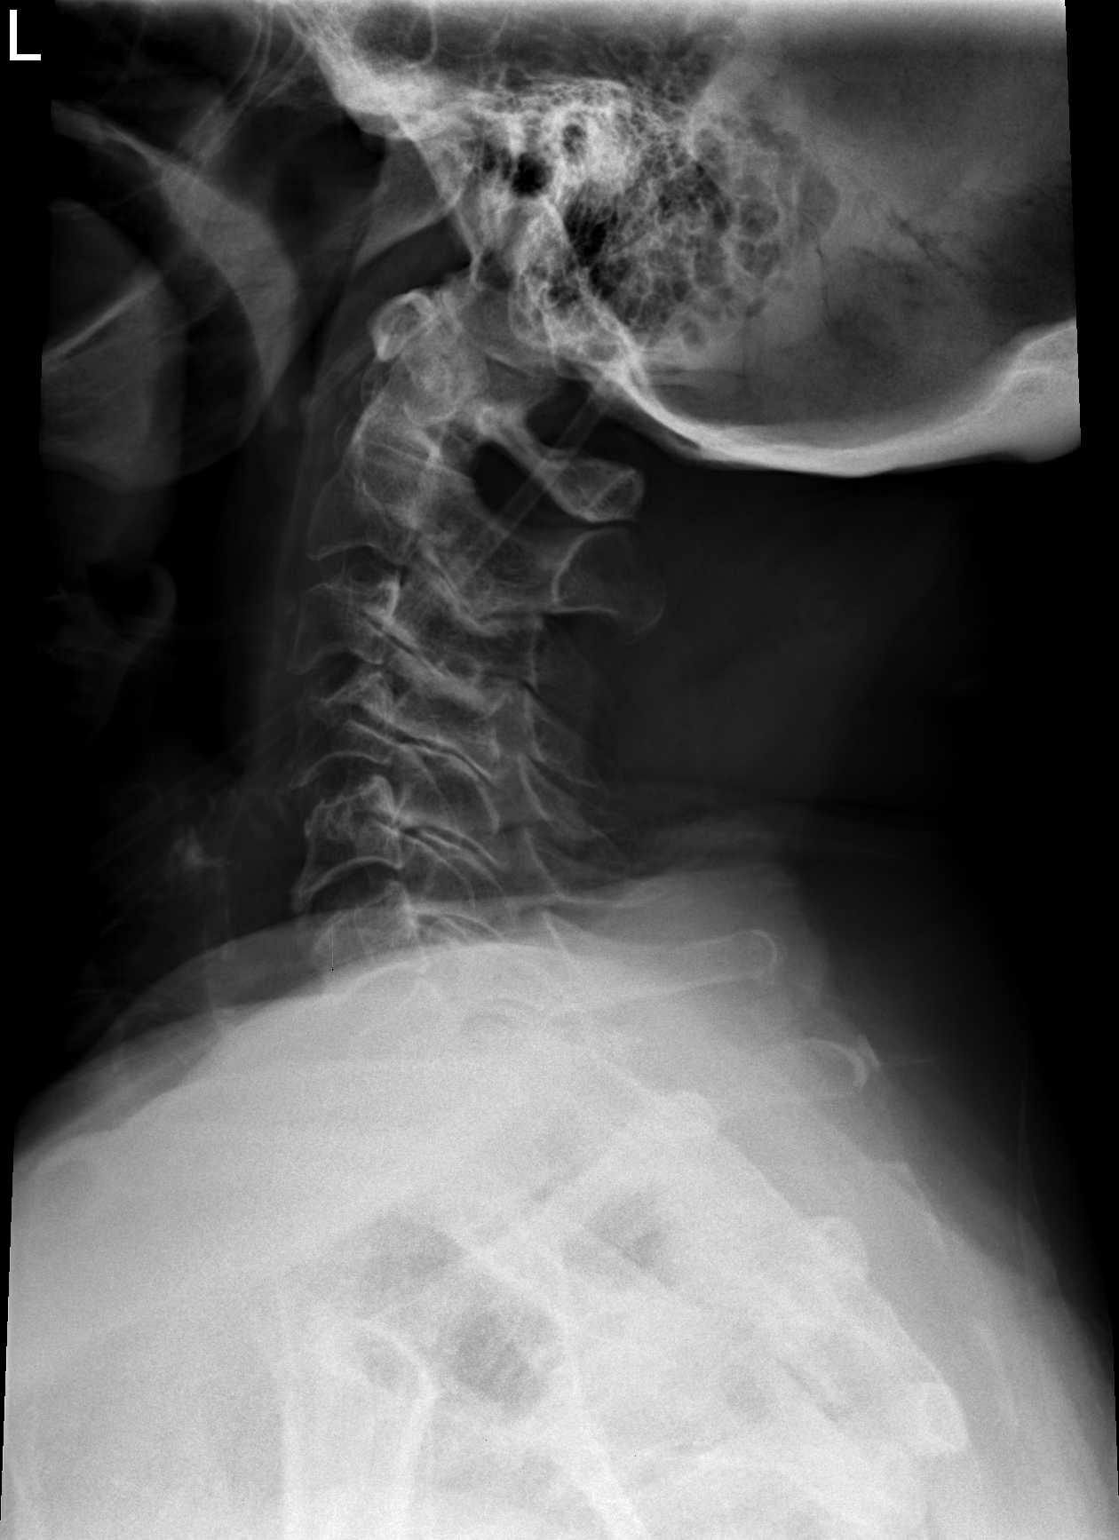

[w swimmers view]
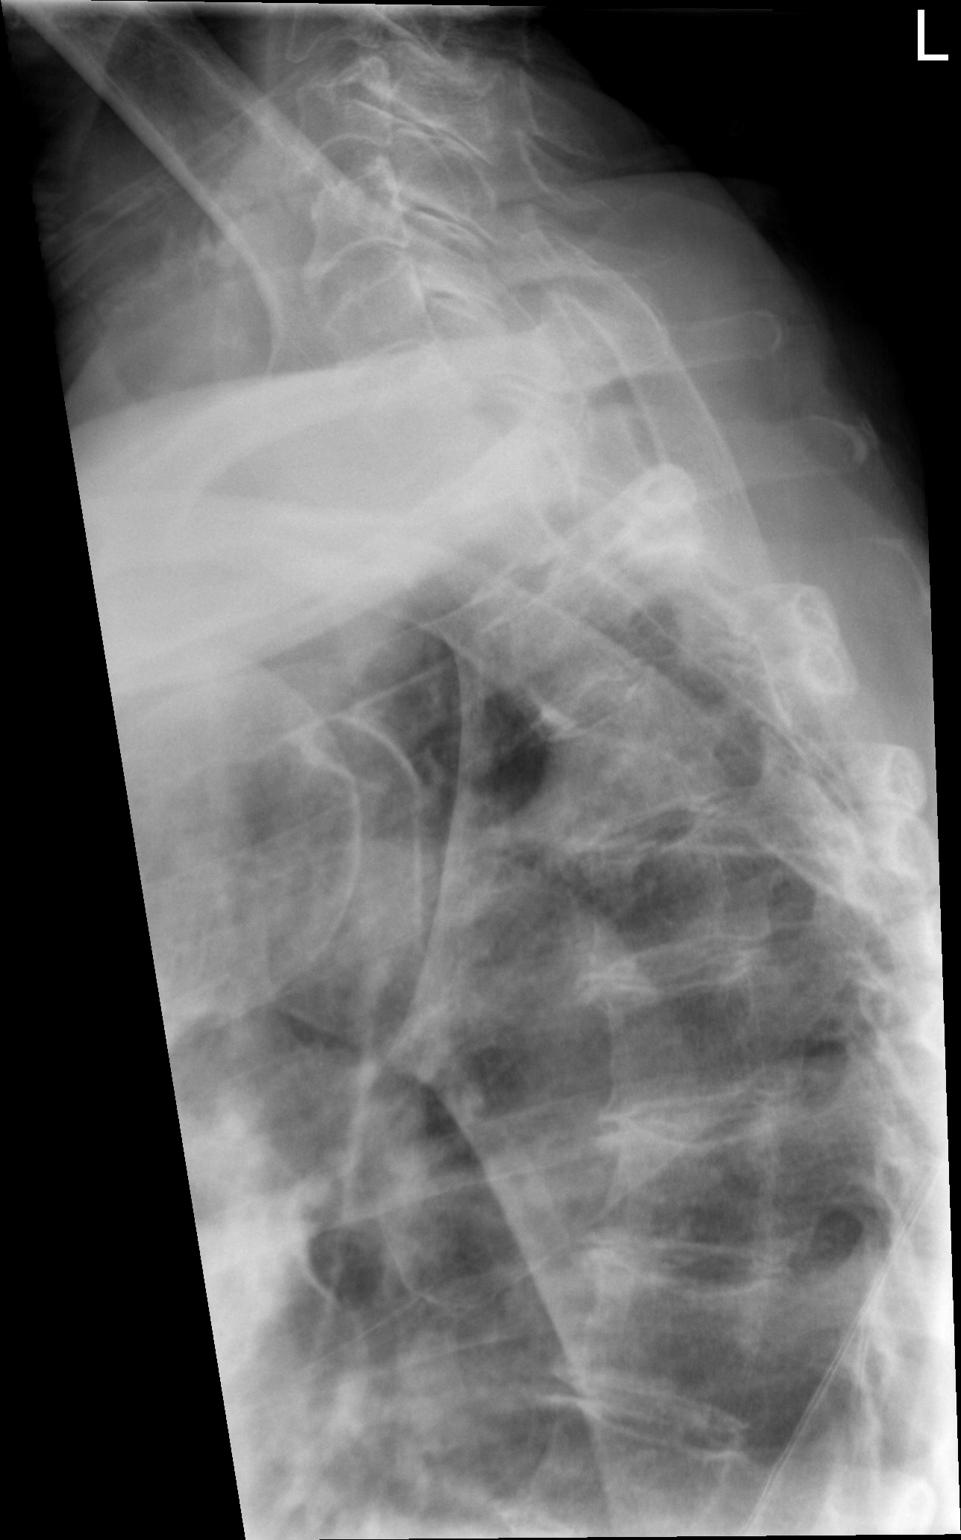

[t c-spine a.p.]
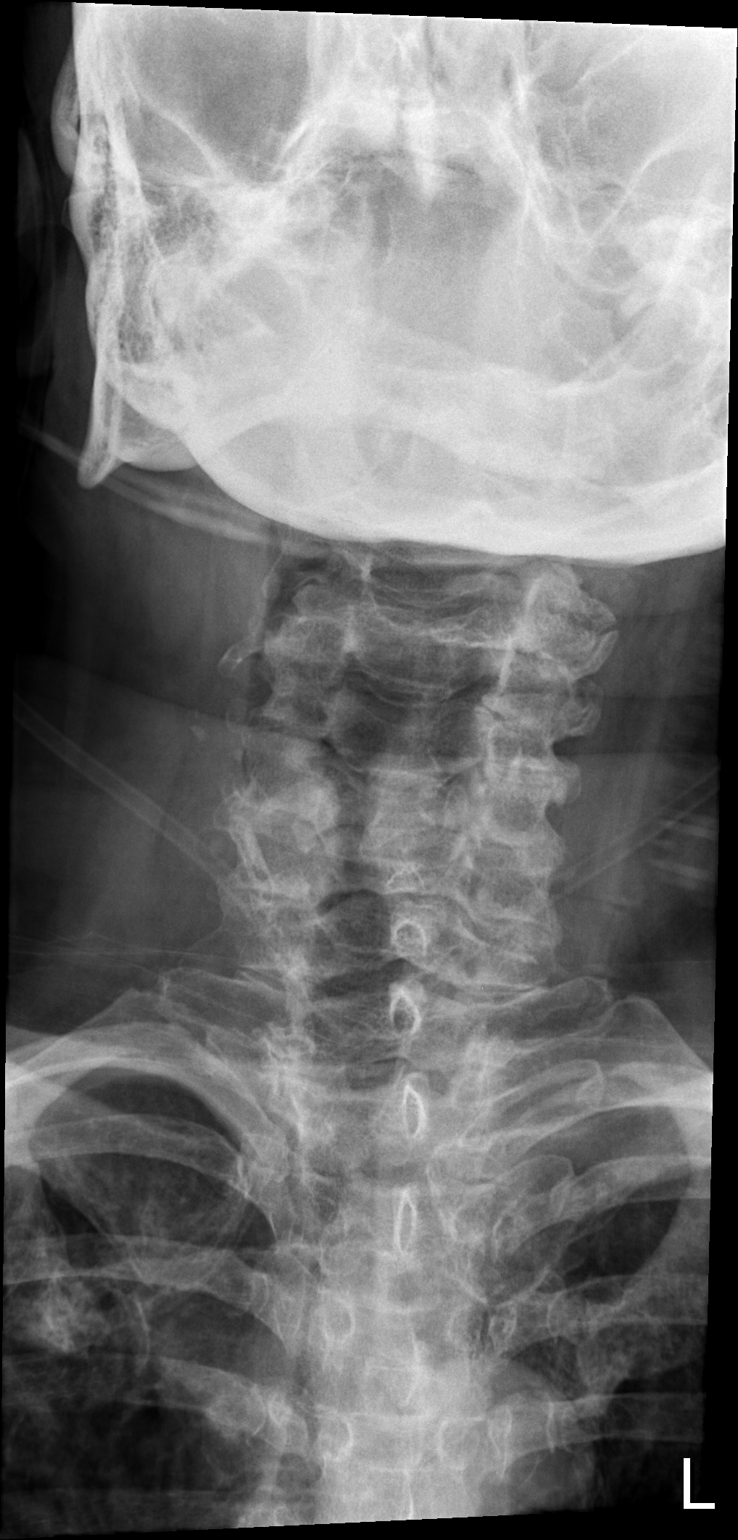

[t c-spine odontoid (1 of 2)]
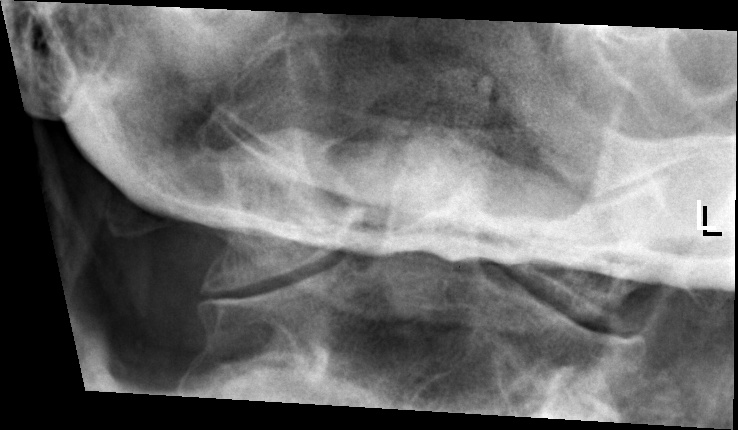

[t c-spine odontoid (2 of 2)]
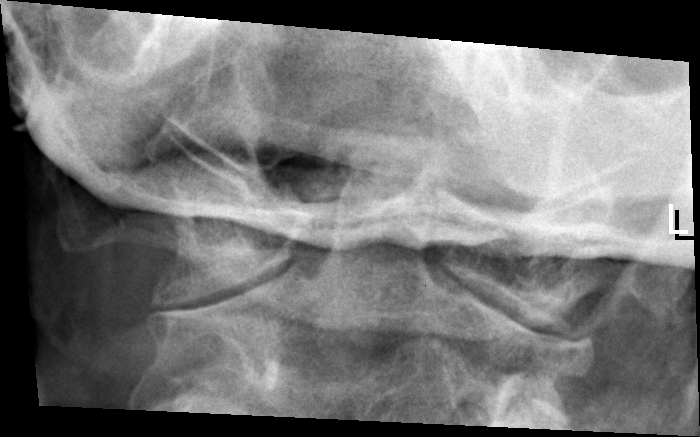

[5 of 5 positions shown; findings below may reference images not displayed]

FINDINGS: There is anatomic alignment of the vertebral bodies.  There is no vertebral body height loss.  The odontoid is intact.  Soft tissues are within normal limits.  Degenerative changes are seen throughout the cervical spine.
IMPRESSION: No obvious evidence of injury.  CT can be performed as clinically indicated. 
 RIGHT SHOULDER ? 3 VIEW:
FINDINGS: No acute fractures or dislocations are seen.  Degenerative changes of the AC joints are present with inferior osteophytes.
IMPRESSION: No acute bony injury.

## 2007-03-25 IMAGING — CT CT PELVIS W/ CM
2 of 12 series · 9 of 30 positions shown, 10 images · IV contrast (omnipaque)
Comparison: 02/11/07.
COMPARISON: 02/11/07.
COMPARISON: None.

CLINICAL DATA: 65 year-old female, trauma, fall down stairs, headache and loss of consciousness.
HEAD CT WITHOUT CONTRAST:
TECHNIQUE: Contiguous axial images were obtained from the base of the skull through the vertex according to standard protocol without contrast.
TECHNIQUE: Multidetector CT imaging of the cervical spine was performed.  Multiplanar CT image reconstructions were also generated.
TECHNIQUE: Multidetector CT imaging of the abdomen was performed following the standard protocol during bolus administration of intravenous contrast.
Contrast:  044cc Omnipaque 300
TECHNIQUE: Multidetector CT imaging of the pelvis was performed following the standard protocol during bolus administration of intravenous contrast.

[Series 601: cor bone c spine · coronal · 0.23mm/px · 6 of 33 slices shown]
[im 5/33  soft-tissue]
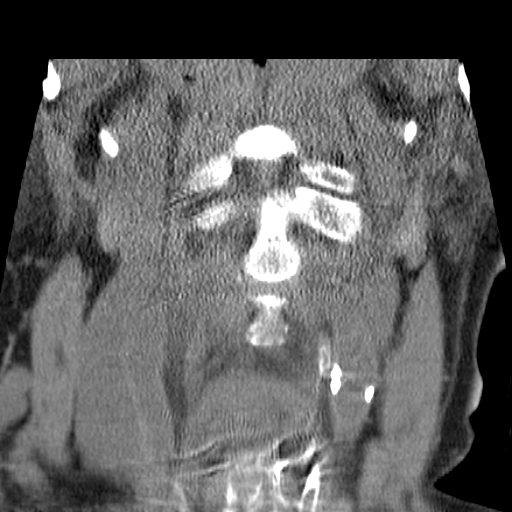
[im 11/33  bone]
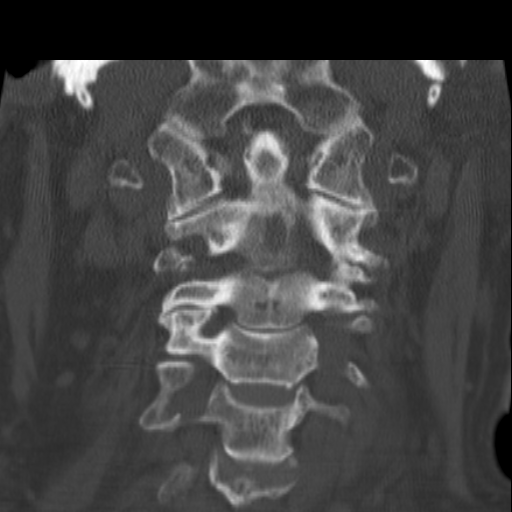
[im 14/33  bone]
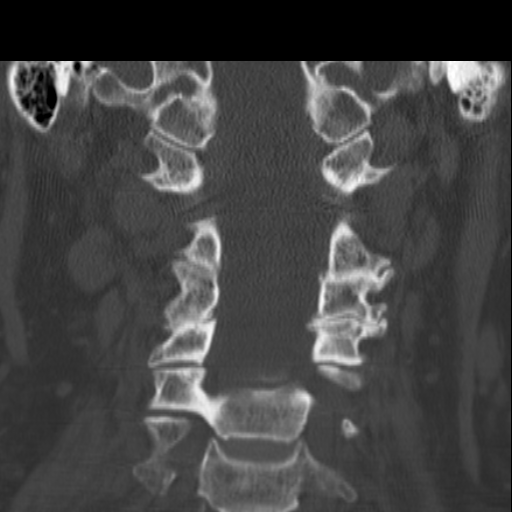
[im 17/33  bone]
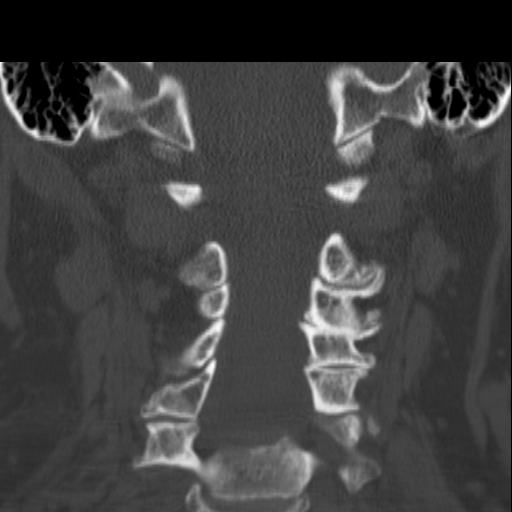
[im 19/33  bone]
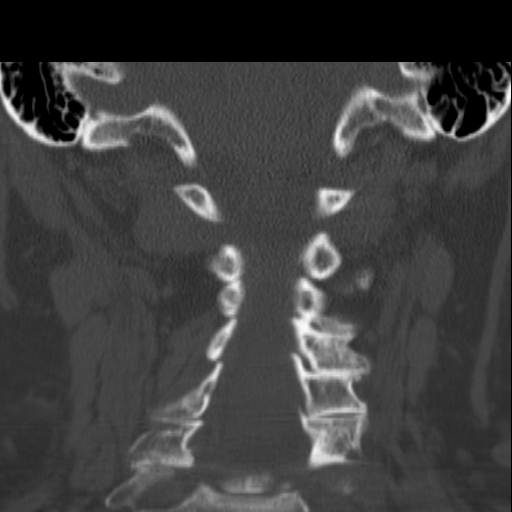
[im 22/33  bone]
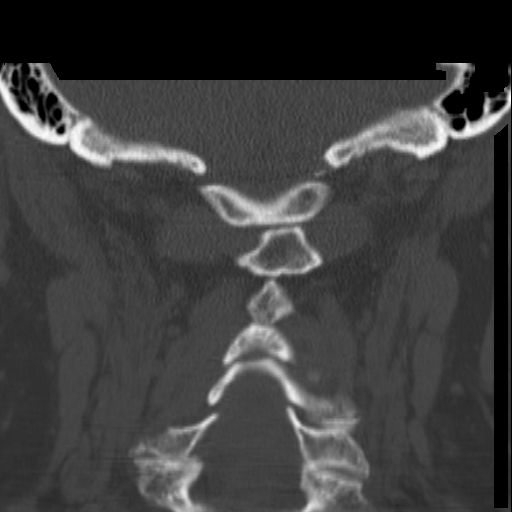

[Series 1000: sag a/p · axial · 0.83mm/px · z∈[-246,+9]mm · 3 of 129 slices shown, 4 images]
[im 1/129  soft-tissue]
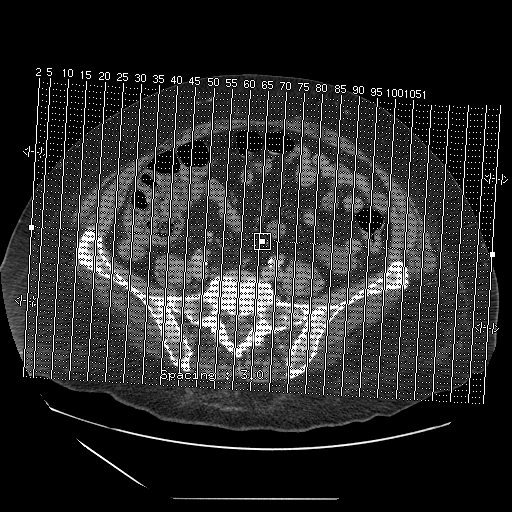
[im 1/129  bone]
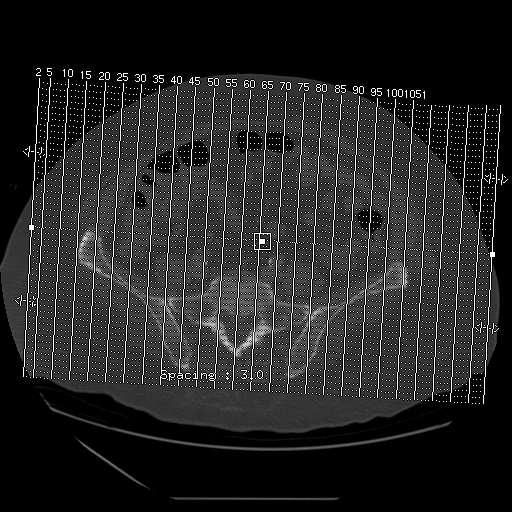
[im 65/129  bone]
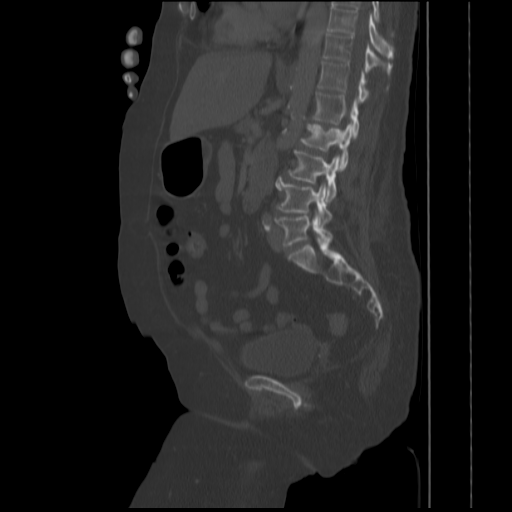
[im 129/129  bone]
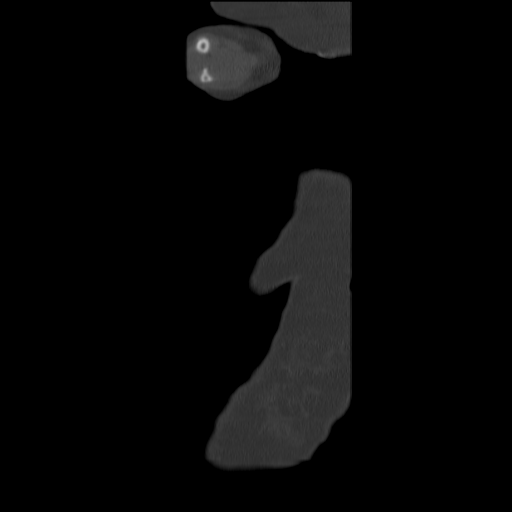

[9 of 30 positions shown; findings below may reference images not displayed]

FINDINGS: There is a high left frontal scalp hematoma without underlying depressed skull fracture.  No acute intracranial hemorrhage, infarction, mass lesion, mass effect, midline shift, herniation, or extraaxial fluid collection.  Gray and white matter differentiation is maintained.  Cisterns are patent. Mastoid and sinuses are clear.
IMPRESSION: High left frontal scalp hematoma.  No acute intracranial finding.
CERVICAL SPINE CT WITHOUT CONTRAST:
FINDINGS: The C-6 vertebra demonstrates an acute fracture. Specifically, there is a nondisplaced fracture of the C-6 vertebral body posteriorly without significant compression deformity or retropulsion. Additionally, there is a fracture of the C-6 right facet with displaced fragments and slight subluxation.  This fracture also extends through the right C-6 lamina.  Cervical spine alignment remains anatomic.  Spondylosis and degenerative disk disease noted from C-3 to C-7.
No appreciable epidural hematoma.
IMPRESSION: 1.  Acute C-6 posterior vertebral body fracture through the posterior cortex without significant compression or retropulsion.  
2.  Acute right C-6 fractured facet with slight subluxation and displaced fracture fragments.  This fracture also extends to involve the right C-6 lamina.
ABDOMEN CT WITH CONTRAST:
FINDINGS: Minimal bibasilar atelectasis, worse on the right. No pericardial or pleural effusion. Normal heart size.  Degenerative changes of the lower thoracic spine.  
In the abdomen, the patient is status post cholecystectomy.  The liver, kidneys, adrenal glands, spleen, and pancreas are normal for age.  No acute bowel obstruction, intraabdominal hemorrhage, hematoma, free fluid, fluid collection, or free air.  Aortoiliac atherosclerosis is noted without aneurysm.  Degenerative changes are present in the spine with a chronic superior endplate compression fracture at L-2 compared to prior imaging.
IMPRESSION: 1.  Status post cholecystectomy.
2.  No acute intraabdominal finding.
3.  Remote L-2 superior endplate compression fracture.
PELVIS CT WITH CONTRAST:
FINDINGS: Iliac atherosclerosis is noted without aneurysm. No pelvic acute hematoma, retroperitoneal bleed, fluid collection, adenopathy, or bowel abnormality. Diverticulosis is noted of the colon.  Degenerative changes are present of the lumbosacral spine and SI joints.  Left anterior thigh bruising and hematoma is identified. No underlying hip fracture or malalignment.  Bony pelvis appears intact.
IMPRESSION: 1.  No acute intrapelvic finding.
2.  Anterior left thigh subcutaneous bruising and swelling.
Note:  Findings were immediately called to Dr. Johan-Martin Loderaud in the Emergency Department.

## 2007-03-25 IMAGING — CR DG SHOULDER 2+V*L*
3 series · 3 of 3 positions shown · non-contrast
Comparison: none

CLINICAL DATA: Lateral shoulder pain after a fall. 
 LEFT SHOULDER ? 3 VIEW:

[t shoulder ap internal left]
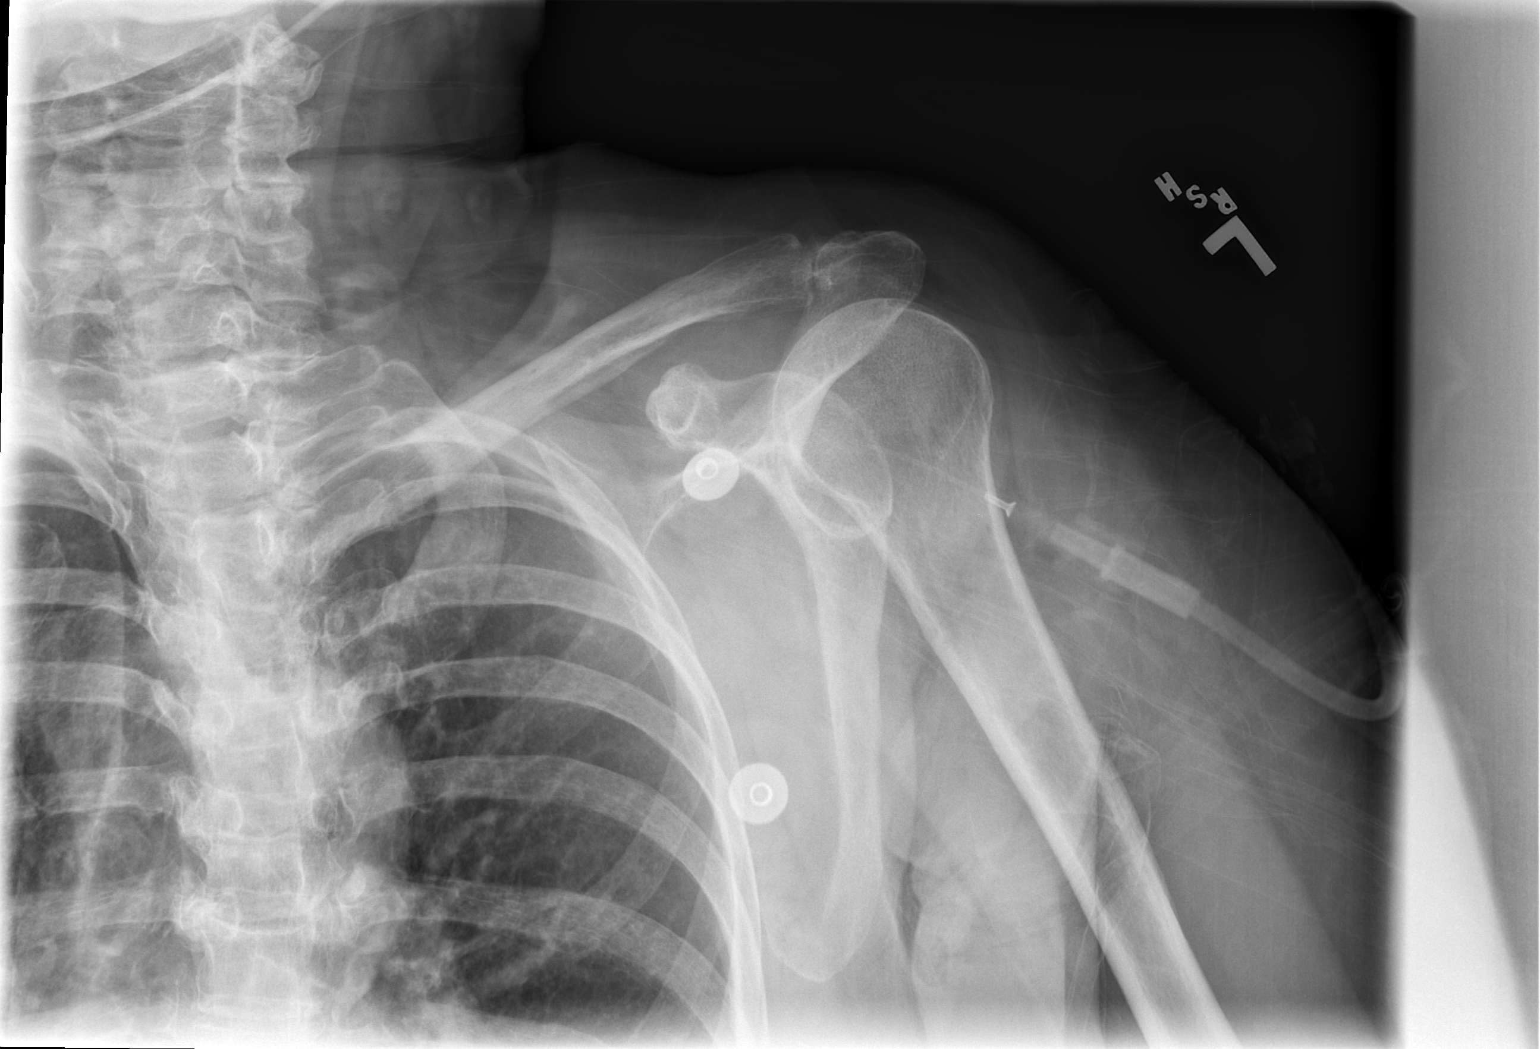

[t shoulder ap external left]
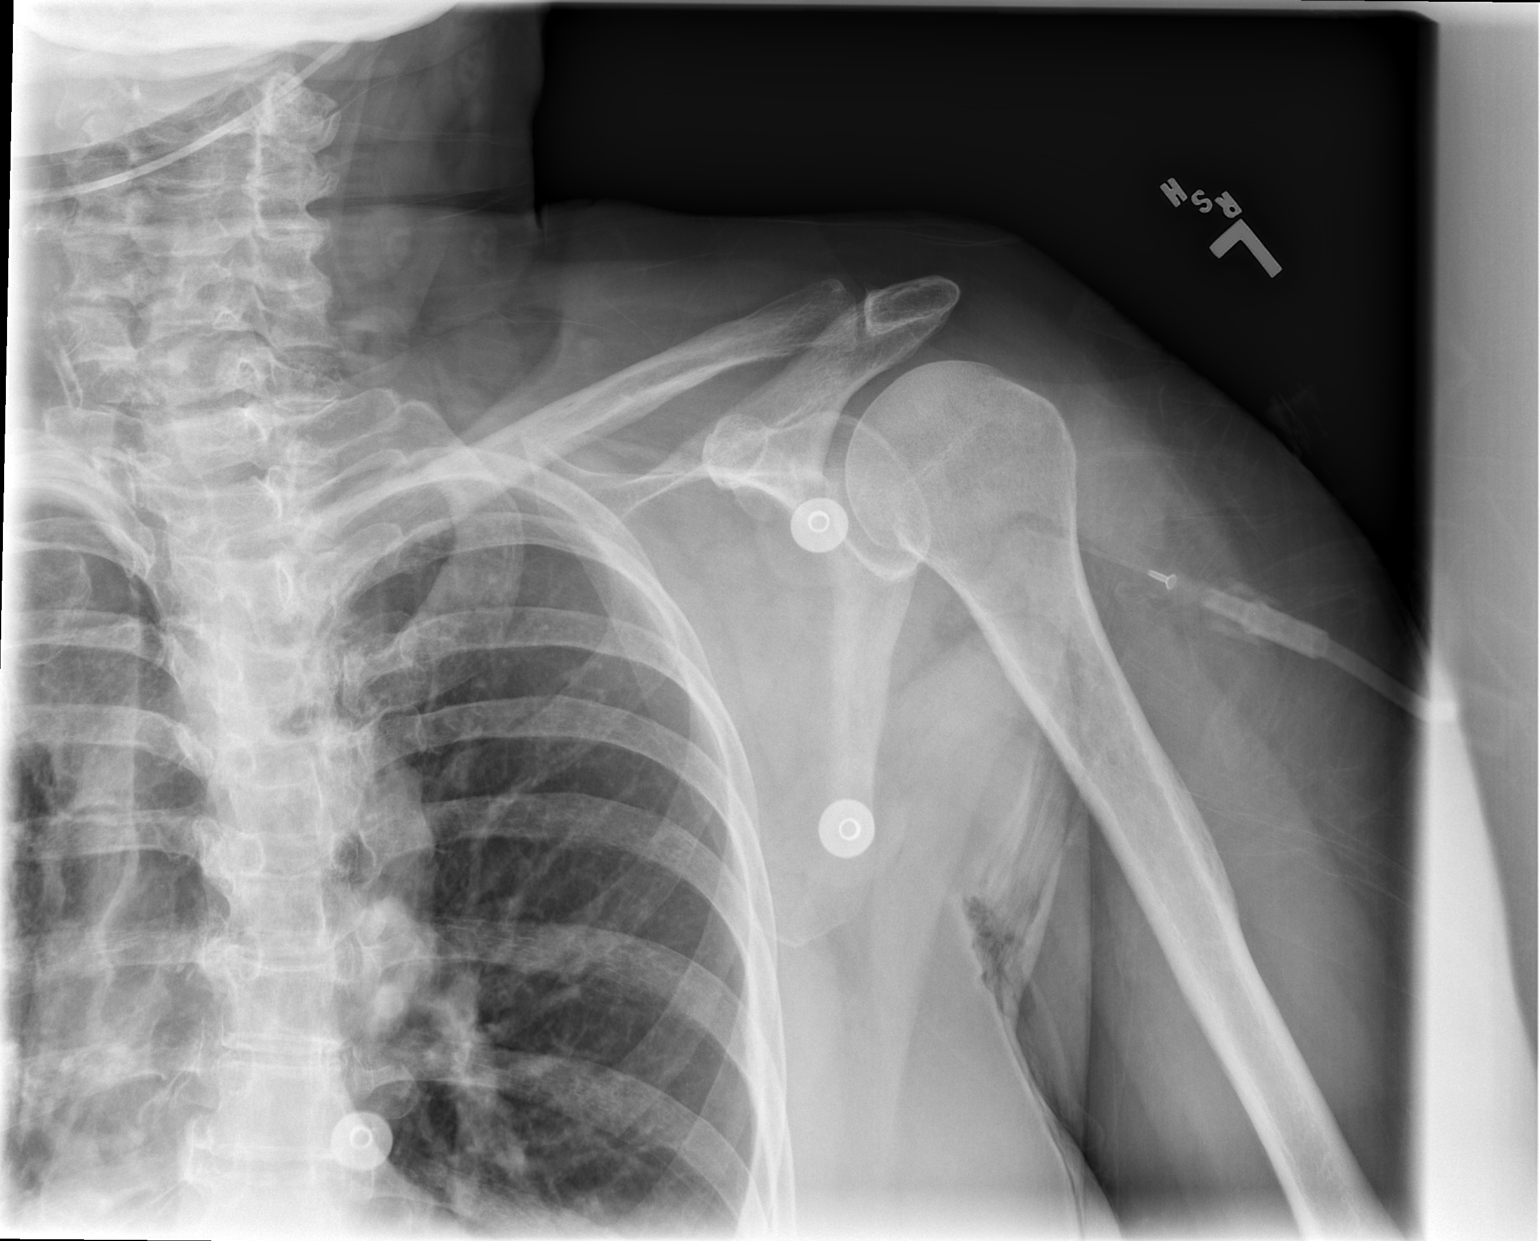

[t shoulder y view left]
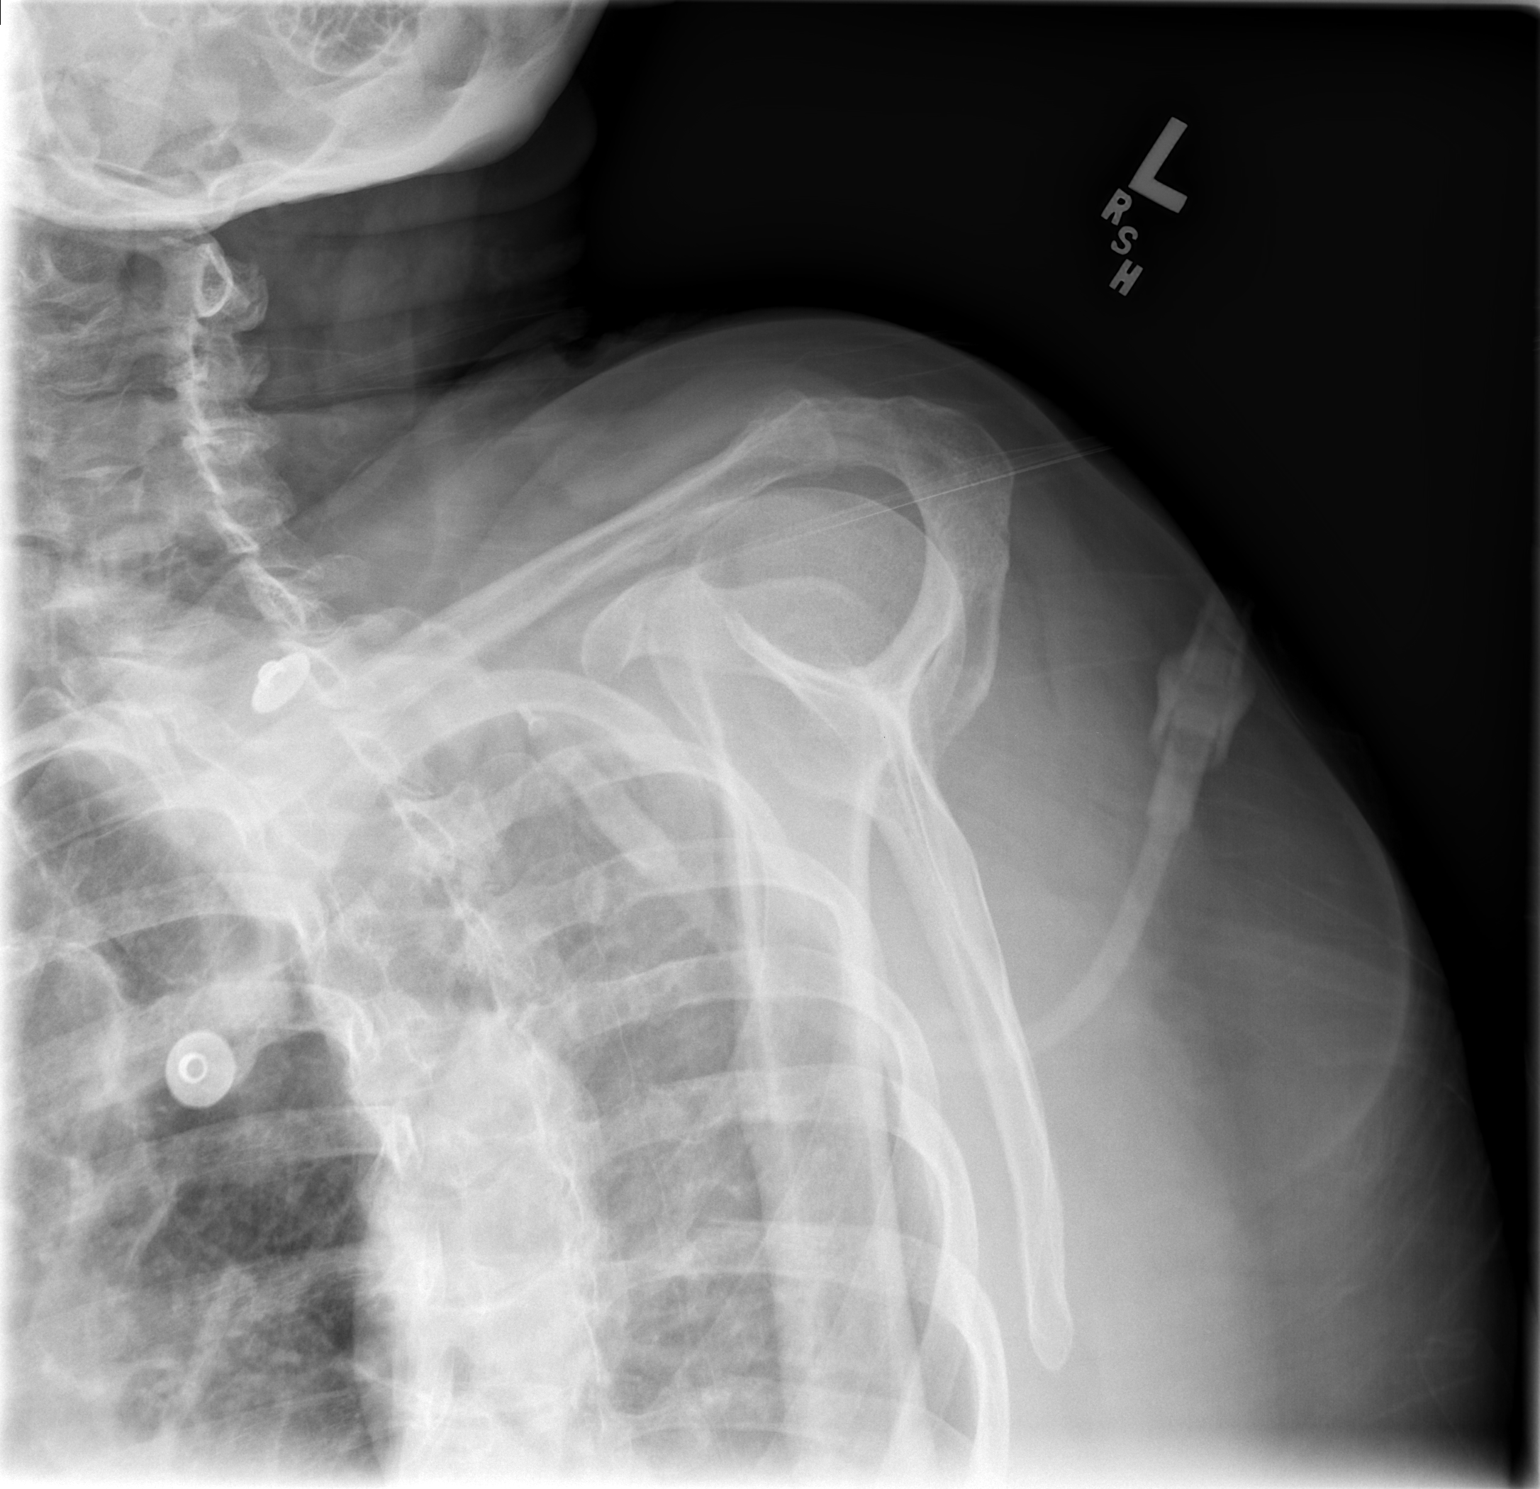

[3 of 3 positions shown; findings below may reference images not displayed]

FINDINGS: Three views of the left shoulder were obtained.  No acute fracture is seen.  The glenohumeral joint appears normal, and the AC joint is normally aligned.
IMPRESSION: Negative left shoulder.

## 2007-03-27 IMAGING — CR DG CHEST 2V
1 series · 1 of 1 positions shown · non-contrast
Comparison: 02/20/2007

CLINICAL DATA: Patient fell. C6 fracture. 
 CHEST - 2 VIEW:

[view not recorded]
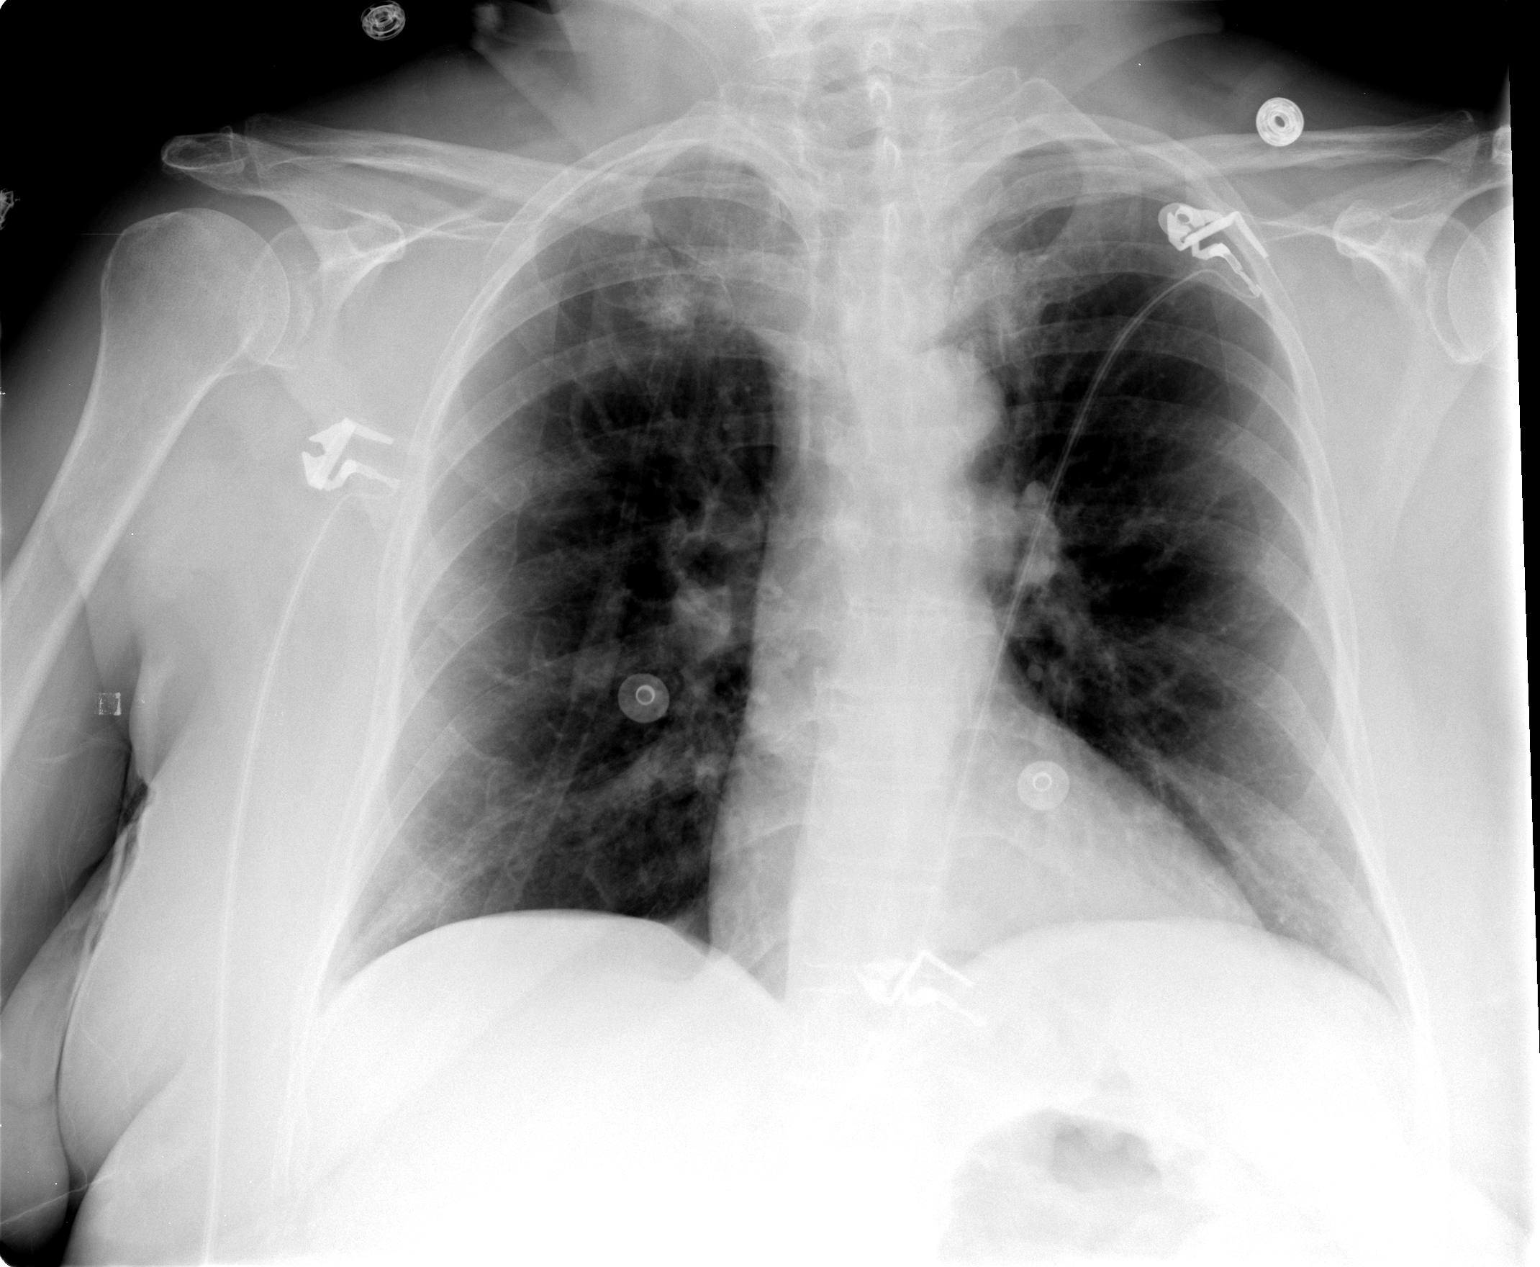

[1 of 1 positions shown; findings below may reference images not displayed]

FINDINGS: Metallic clips at gastroesophageal junction region. No acute chest findings.   Lungs are well expanded and clear of an active process. Cardiomediastinal silhouette size and contours are normal. Probable remote fracture posterolateral aspect of left eighth rib.
IMPRESSION: No active chest disease.

## 2007-03-29 ENCOUNTER — Telehealth (INDEPENDENT_AMBULATORY_CARE_PROVIDER_SITE_OTHER): Payer: Self-pay | Admitting: *Deleted

## 2007-03-31 IMAGING — CT CT ANGIO CHEST
2 of 5 series · 19 of 36 positions shown · IV contrast (120 ML OMNI 300)
Comparison: 02/17/07.

CLINICAL DATA: 65-year-old female, recent fall, cervical fracture, elevated D-Dimer, shortness of breath and anxious.  
CT ANGIOGRAPHY OF CHEST:
TECHNIQUE: Multidetector CT imaging of the chest was performed during bolus injection of intravenous contrast.  Multiplanar CT angiographic image reconstructions were generated to evaluate the vascular anatomy.
Contrast:  120 cc Omnipaque 300.

[Series 3: pe · axial · 0.70mm/px · z∈[-305,-30]mm · 16 of 248 slices shown]
[im 14/248  lung]
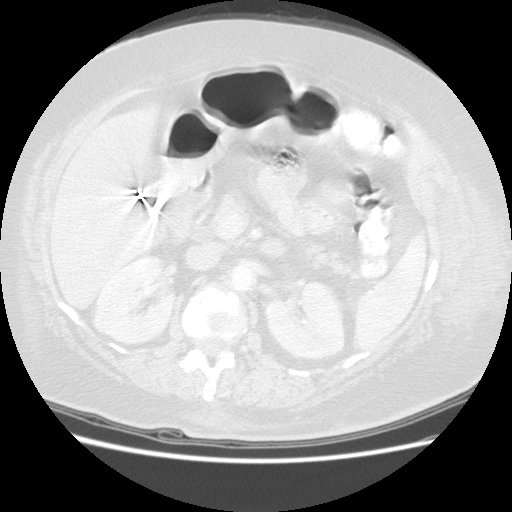
[im 28/248  mediastinal]
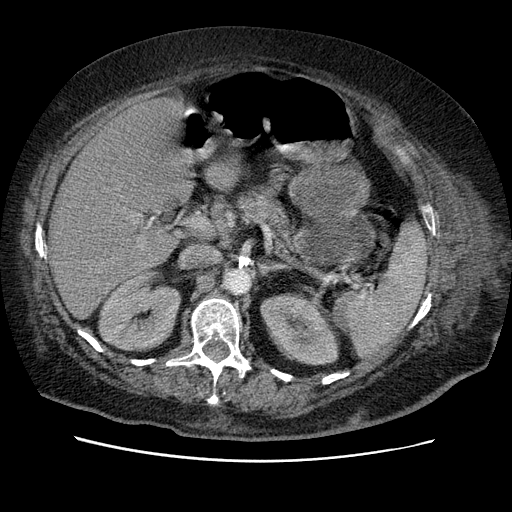
[im 42/248  lung]
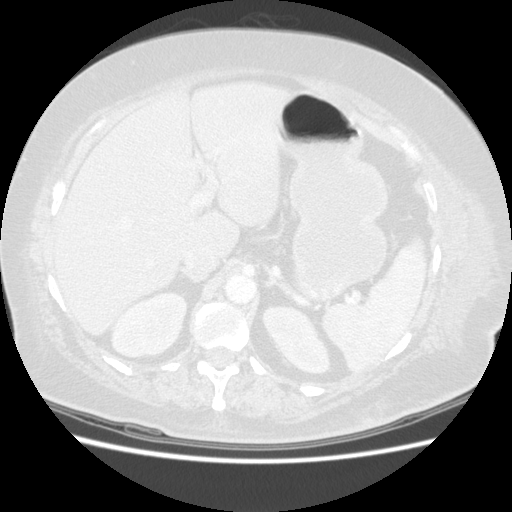
[im 55/248  mediastinal]
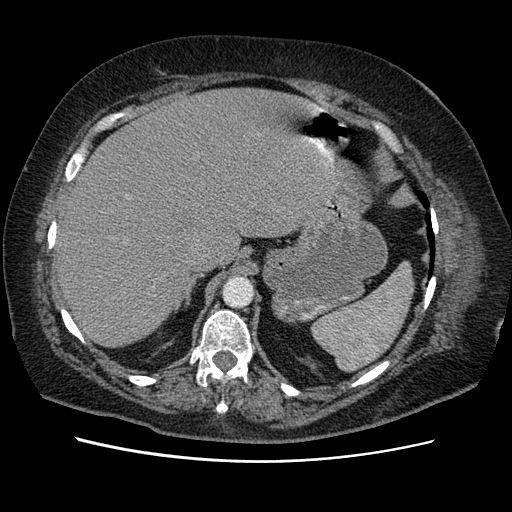
[im 69/248  lung]
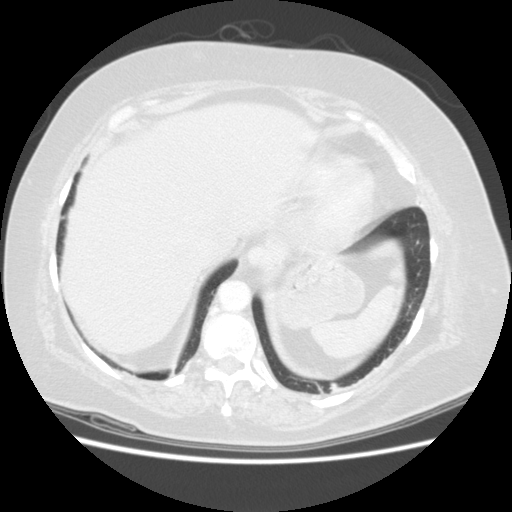
[im 83/248  mediastinal]
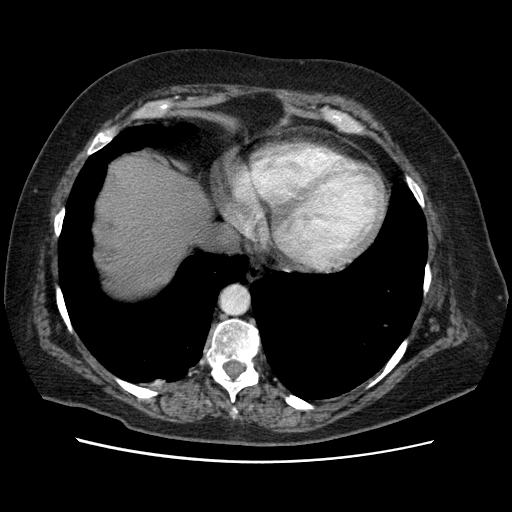
[im 97/248  lung]
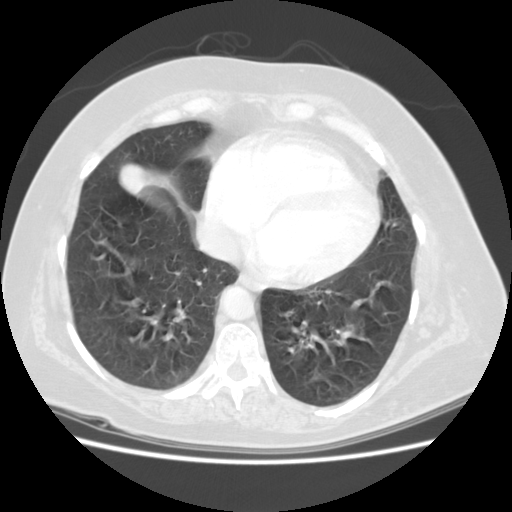
[im 110/248  mediastinal]
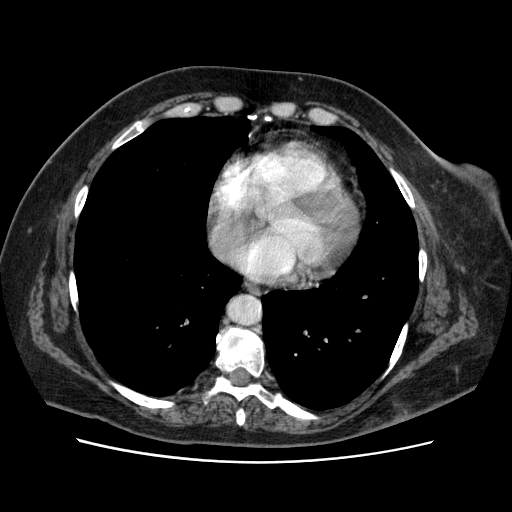
[im 138/248  lung]
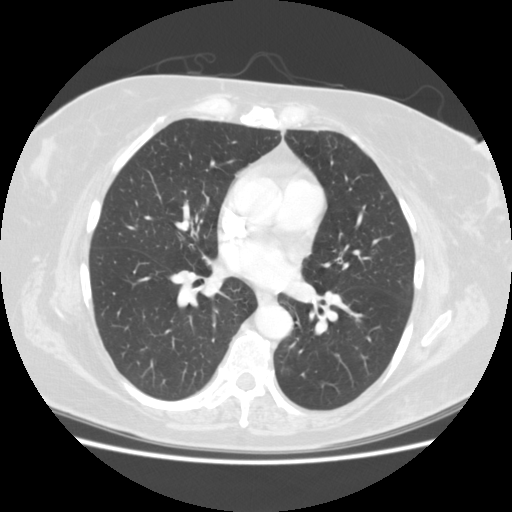
[im 151/248  mediastinal]
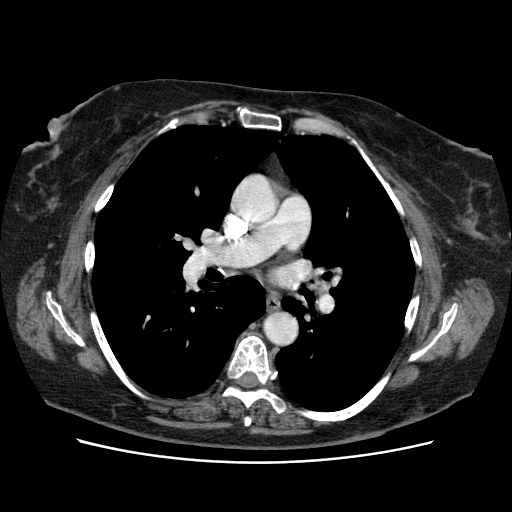
[im 165/248  lung]
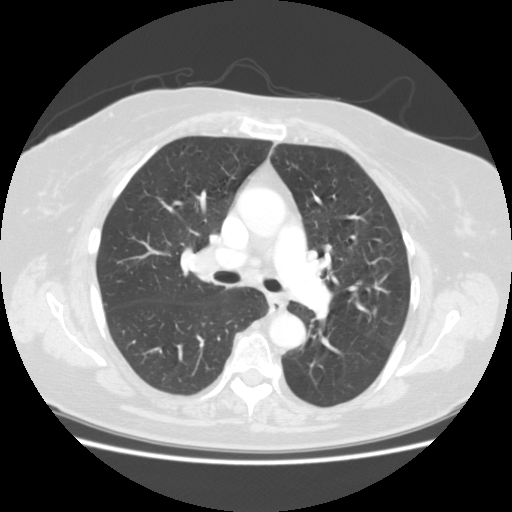
[im 179/248  mediastinal]
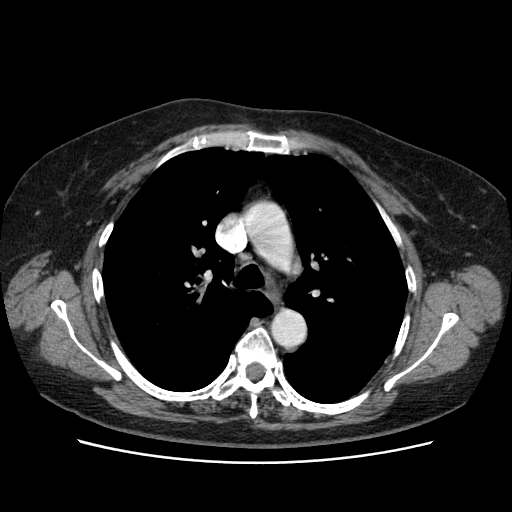
[im 193/248  lung]
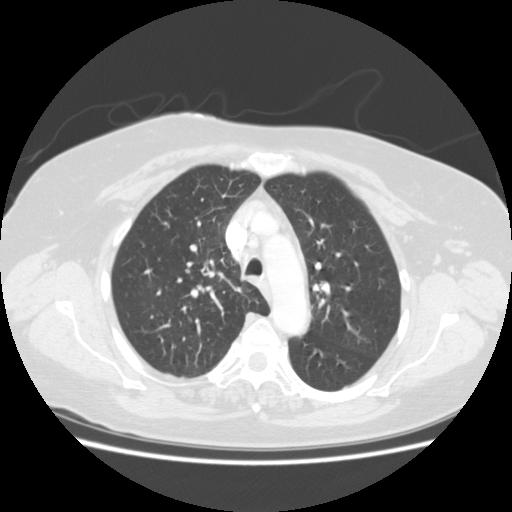
[im 206/248  mediastinal]
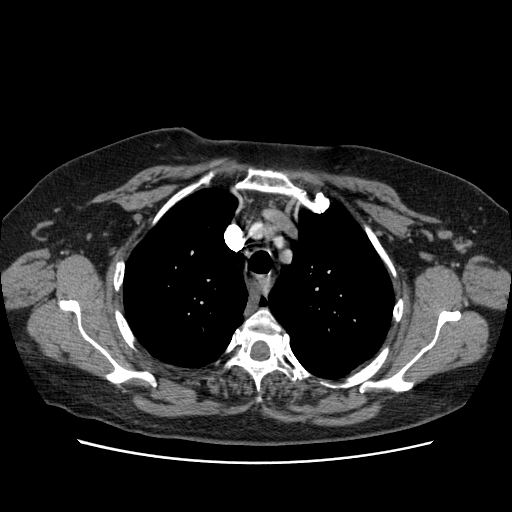
[im 220/248  lung]
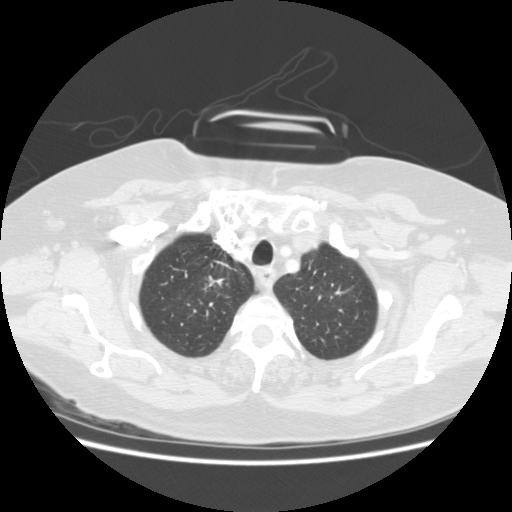
[im 234/248  mediastinal]
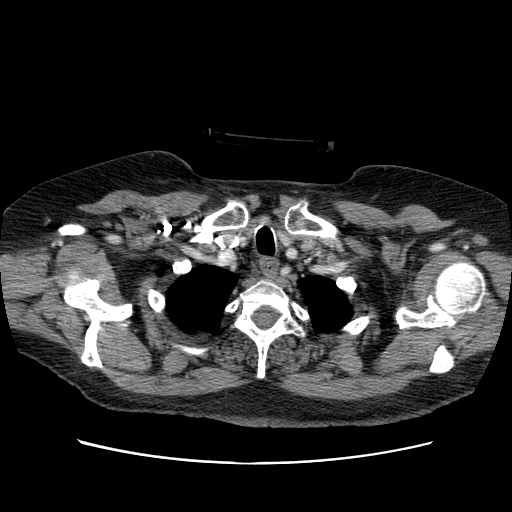

[Series 300: coronal chest · coronal · 0.70mm/px · 3 of 123 slices shown]
[im 25/123  mediastinal]
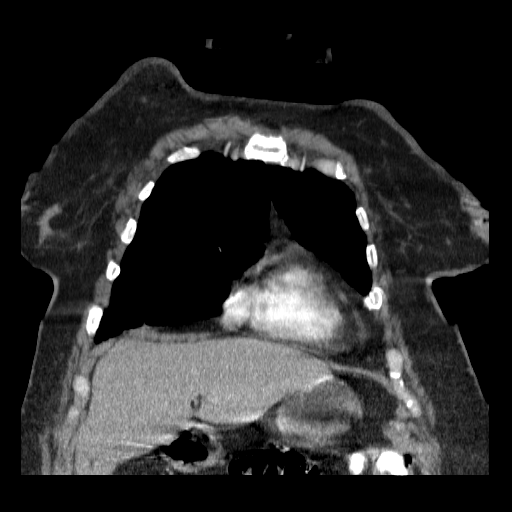
[im 49/123  mediastinal]
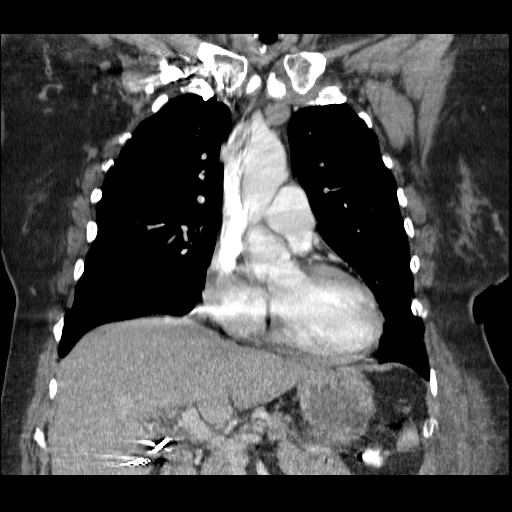
[im 74/123  mediastinal]
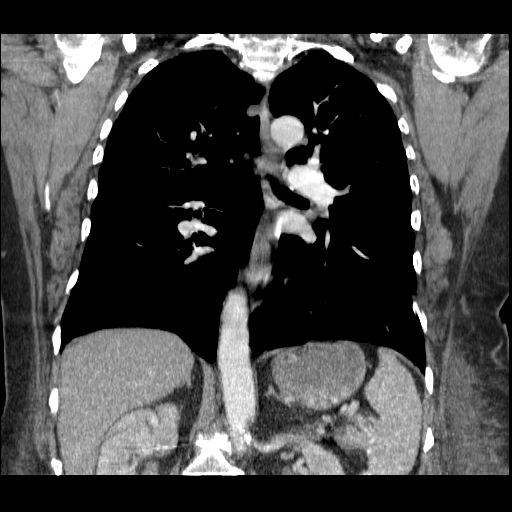

[19 of 36 positions shown; findings below may reference images not displayed]

FINDINGS: No enlarged axillary lymph nodes.  
In the mediastinum, there is a stable precarinal lymph node measuring 11 mm in greatest thickness.  No definite adenopathy in the chest.  Intact thoracic aorta.  No evidence of aneurysm or dissection.  Normal heart size.  Pulmonary arteries are patent centrally.  Specifically no central, proximal or saddle pulmonary embolus identified.  The more peripheral segmental and subsegmental branches are not as well visualized to exclude small emboli within the limits of the study.  Normal heart size.  No peripheral or pleural effusion.  
Imaging of the upper abdomen demonstrates no acute finding. 
Degenerative changes are present of the thoracic spine.  
Lung windows demonstrate motion artifact from respiration.  No acute consolidation, definite pneumonia, edema, interstitial change or bronchiectasis.  Residual subpleural right lower lobe atelectasis and scarring which continues to improve.
IMPRESSION: 1.  No central or proximal pulmonary embolus.  Limited evaluation for small peripheral emboli. 
2.  Stable mild precarinal enlarged lymph node.  
3.  No acute airspace process.  
4.  Continued improvement in dependent right lower lobe atelectasis or scarring.

## 2007-04-01 IMAGING — CR DG CHEST 2V
1 series · 1 of 1 positions shown · non-contrast
Comparison: 02/22/07.

CLINICAL DATA: Fell ? cervical fracture.  Short of breath with altered mental status.
 CHEST ? 2 VIEW ? 02/27/07:

[w chest lat]
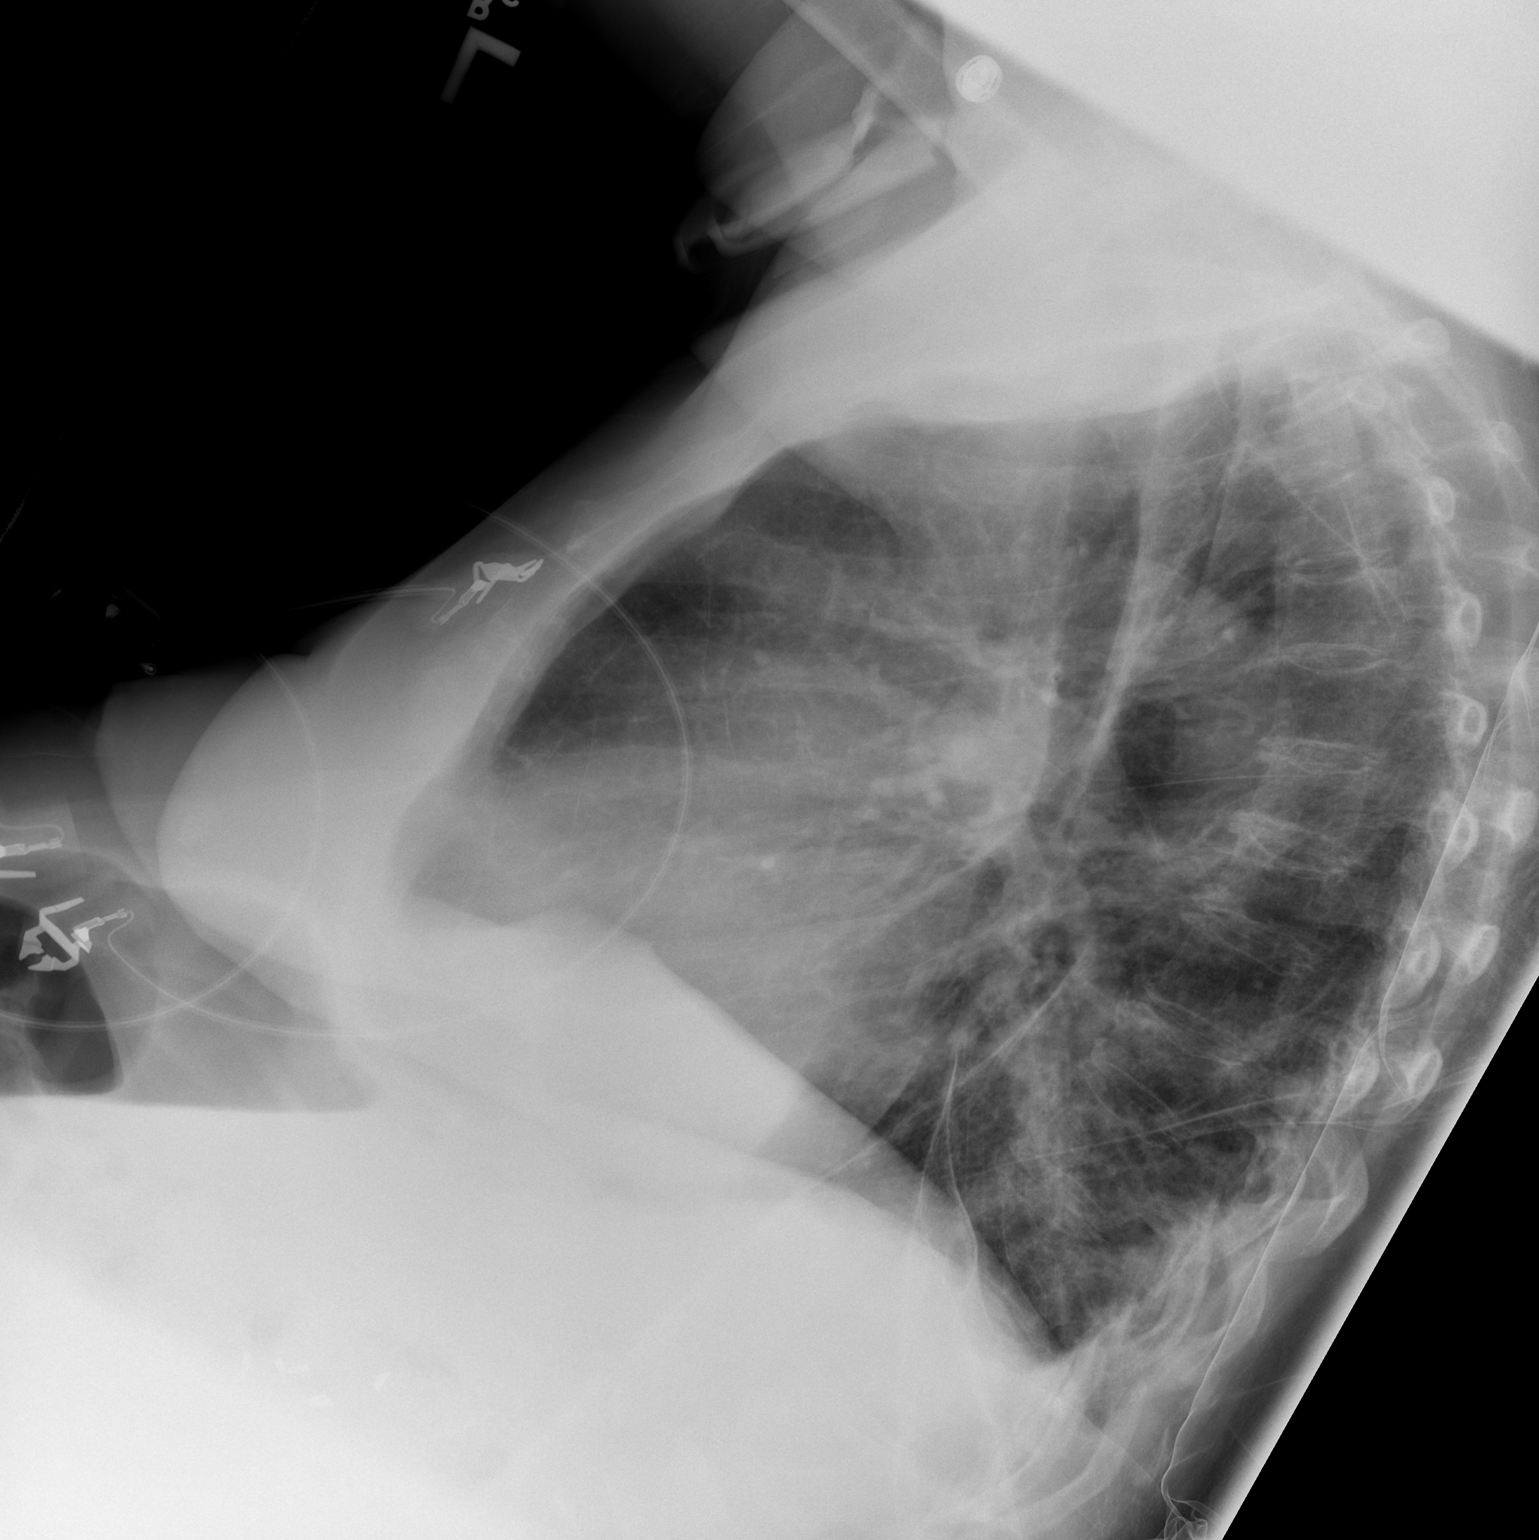

[1 of 1 positions shown; findings below may reference images not displayed]

FINDINGS: Heart size remains normal.  No congestive heart failure.  I cannot exclude an evolving left lower lobe airspace process.  This is suspicious in both views.
IMPRESSION: Possible evolving left lower lobe airspace process.  See report.

## 2007-04-03 IMAGING — CR DG CERVICAL SPINE COMPLETE 4+V
1 series · 1 of 1 positions shown · non-contrast
Comparison: Cervical spine x-rays 02/21/2007 and CT cervical spine 02/20/2007.

CLINICAL DATA: Followup cervical spine fractures.

CERVICAL SPINE - 6 VIEW 03/01/2007:

[w c-spine lat]
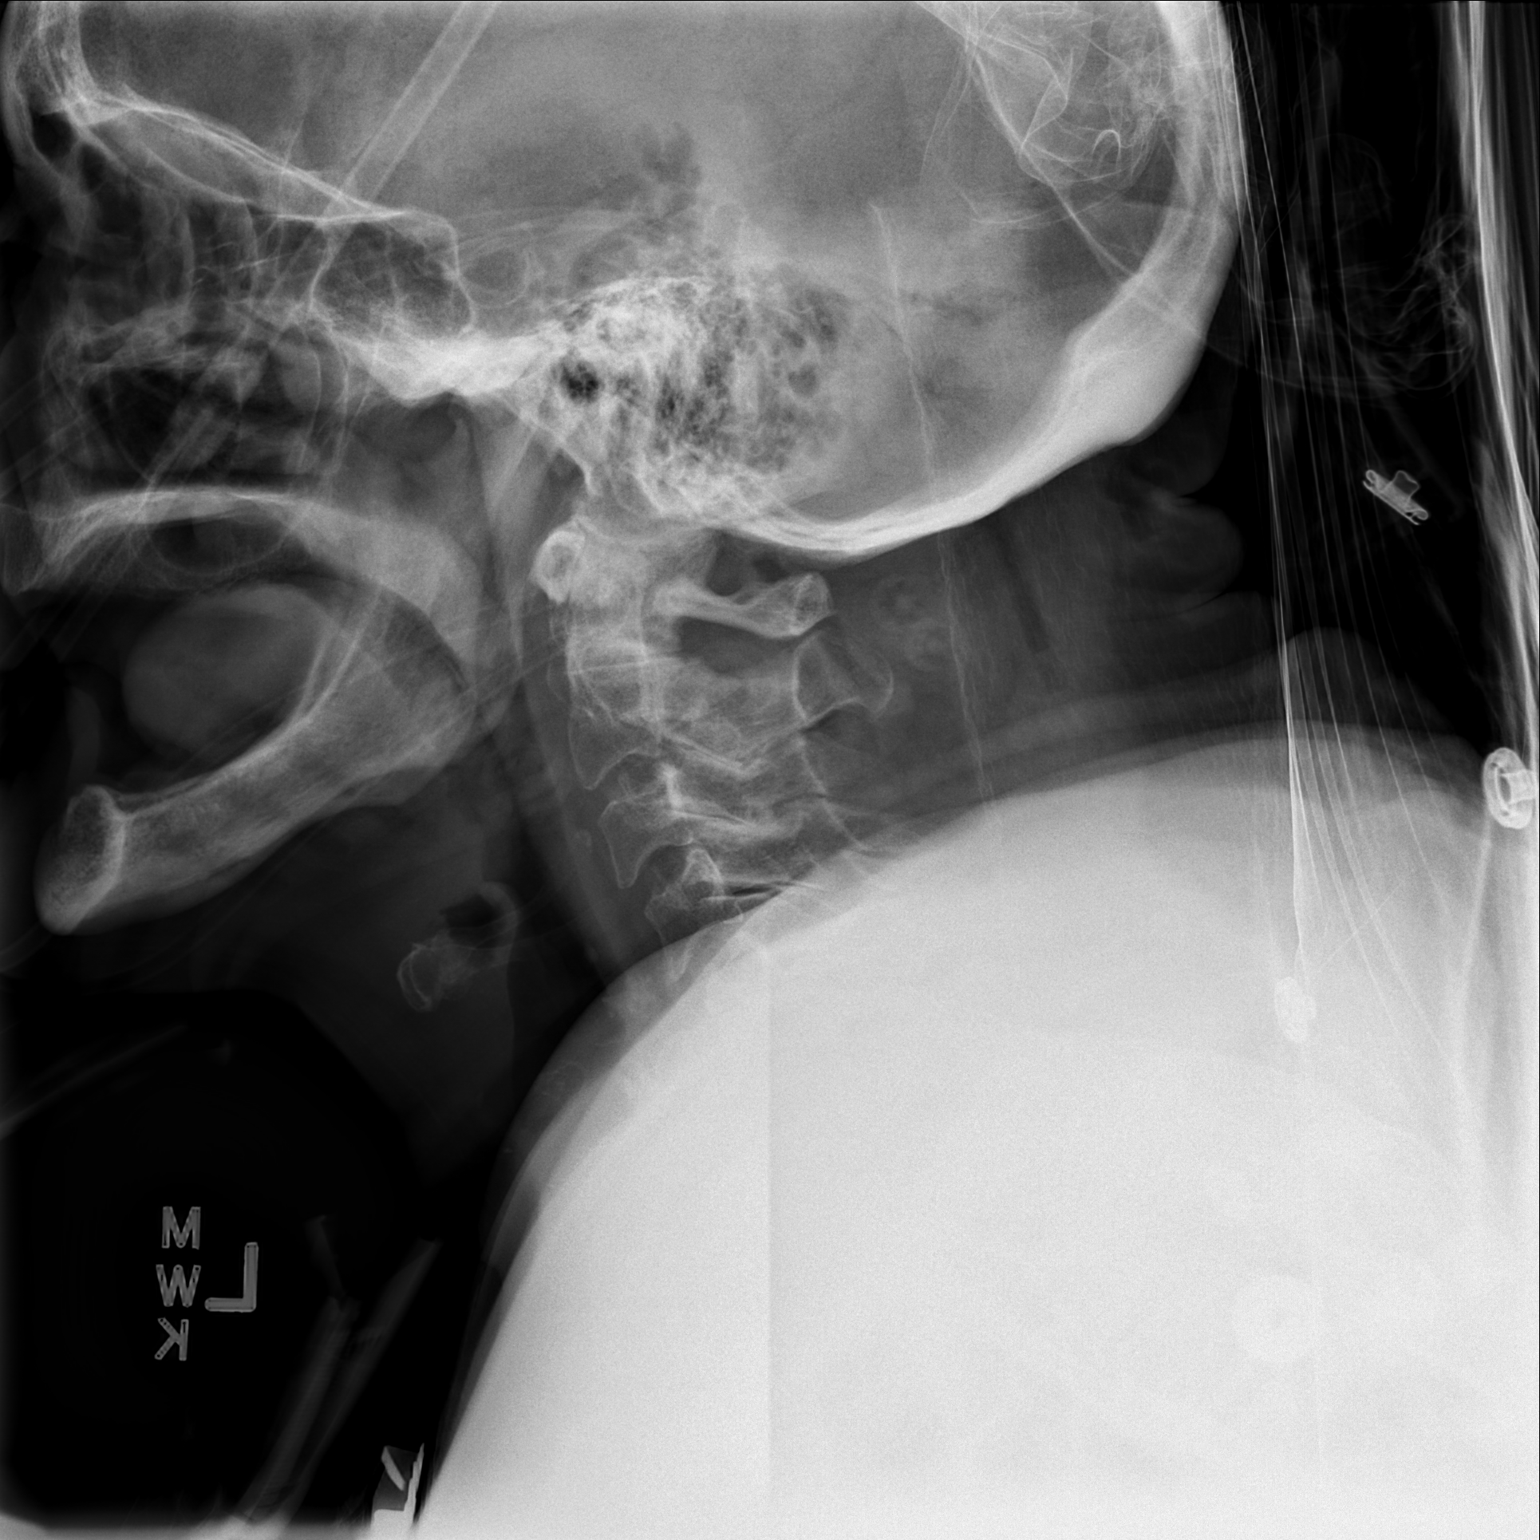

[1 of 1 positions shown; findings below may reference images not displayed]

FINDINGS: The previously identified fracture involving the posterior inferior
endplate of C6 and the fractures involving the right lamina and facet of C6 are
not well demonstrated on the x-rays. There is mild spondylolisthesis of C6
relative to C7 measuring approximately 5 mm, but this is unchanged from the
prior examination. The cervicothoracic junction is suboptimally visualized due
to body habitus. The diffuse facet degenerative changes are again noted. Oblique
views do not suggest severe bony foraminal stenoses.
IMPRESSION: Less than optimal visualization of the previously identified fractures involving
the posterior inferior endplate of C6 and the right C6 laminar and facet
fractures. If further followup is felt necessary, repeat CT cervical spine would
be suggested.

## 2007-04-05 IMAGING — CR DG CHEST 1V PORT
1 series · 1 of 1 positions shown · non-contrast
Comparison: 02/27/07.

CLINICAL DATA: Shortness of breath.
 PORTABLE CHEST - 1 VIEW 03/03/07:

[view not recorded]
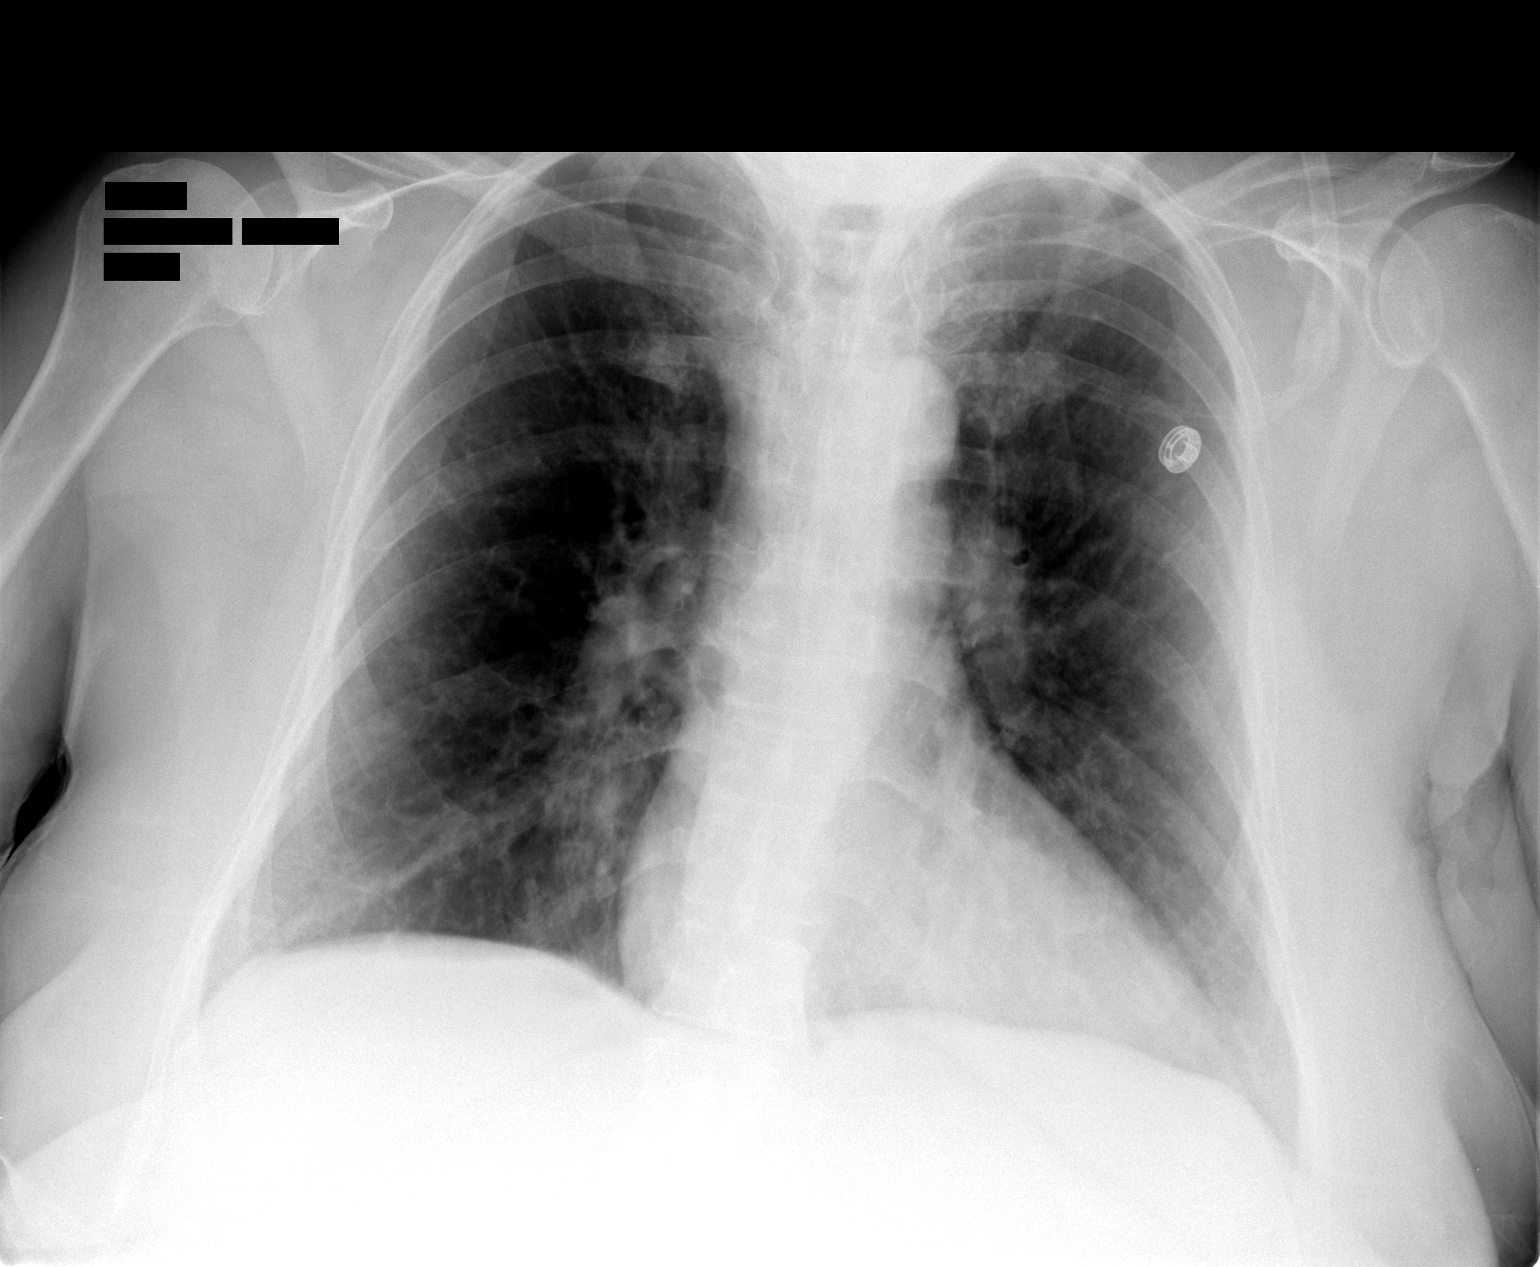

[1 of 1 positions shown; findings below may reference images not displayed]

FINDINGS: AP film at 6766 hours shows no substantial interval change.  The cardio-pericardial silhouette is at upper limits of normal for size.  There is no airspace edema. The questioned left basilar airspace disease seen on the previous study is less apparent on the current film.
IMPRESSION: Stable exam.  No evidence for new or progressive findings.

## 2007-04-11 IMAGING — CR DG CHEST 2V
2 series · 2 of 2 positions shown · non-contrast
Comparison: 03/03/07.

CLINICAL DATA: Shortness of breath, congestion, pneumonia.
 CHEST - 2 VIEW - 03/09/07:

[w chest pa]
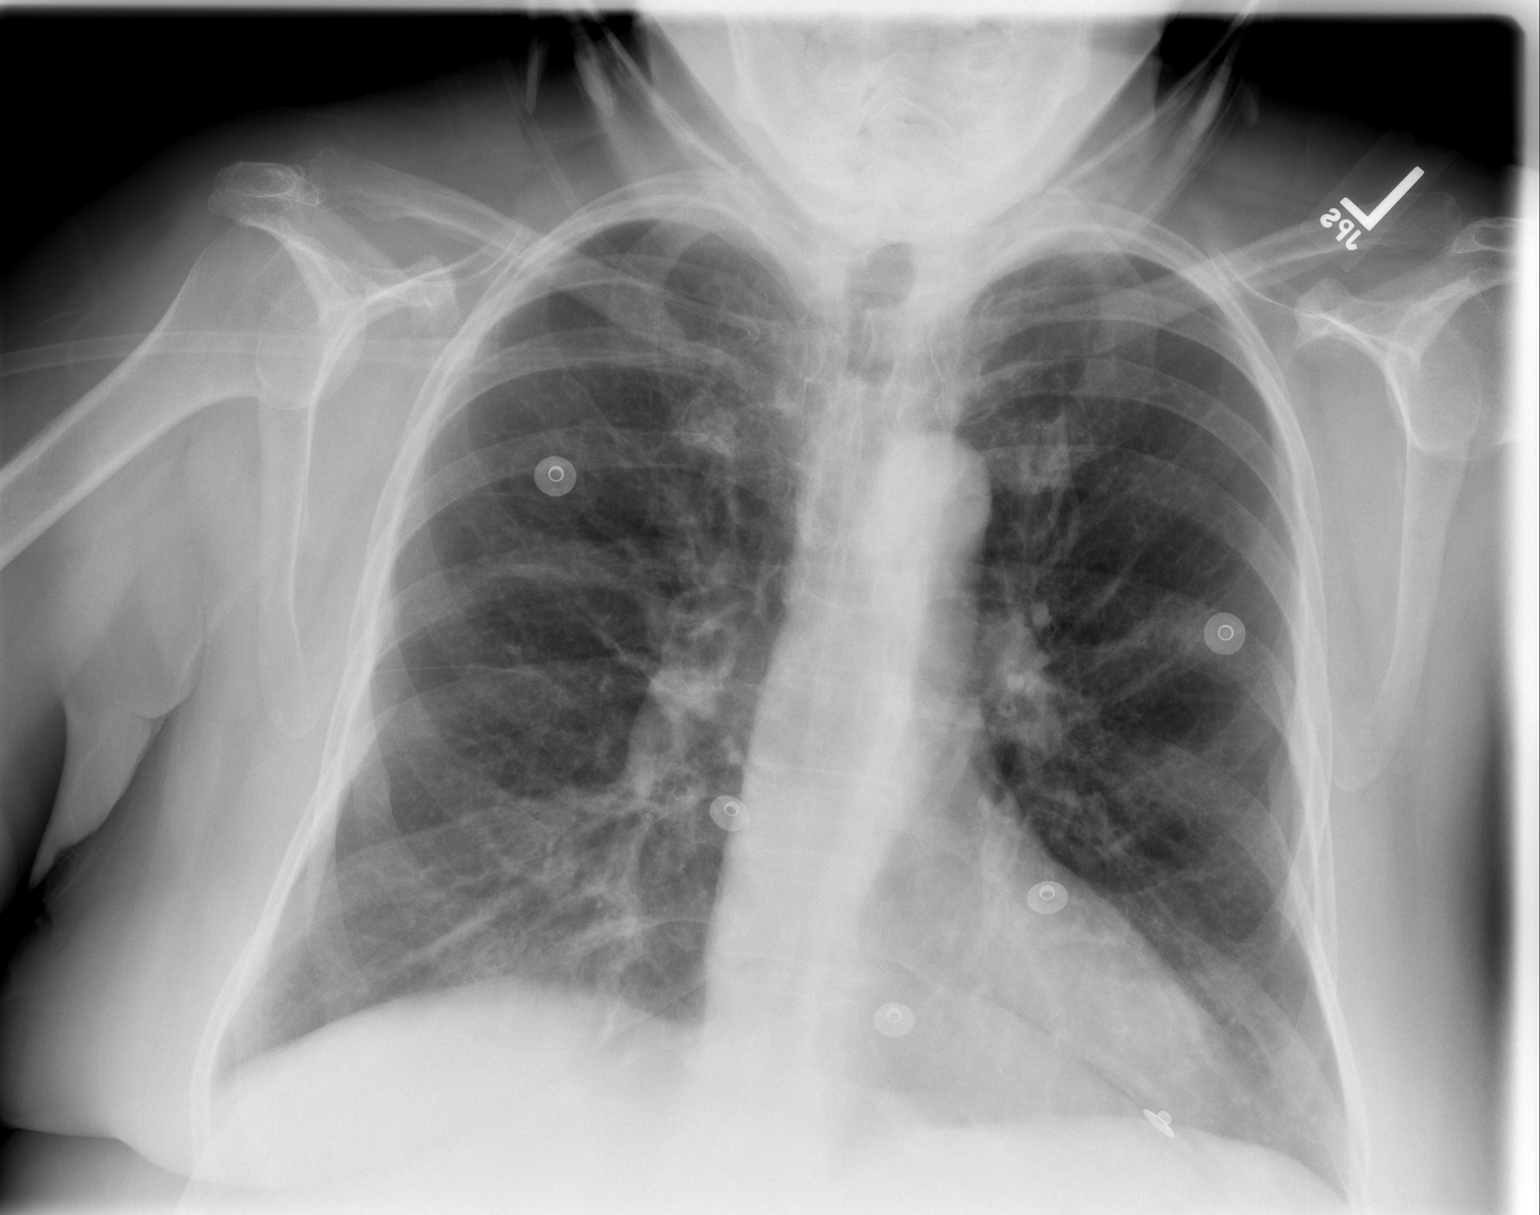

[w chest lat]
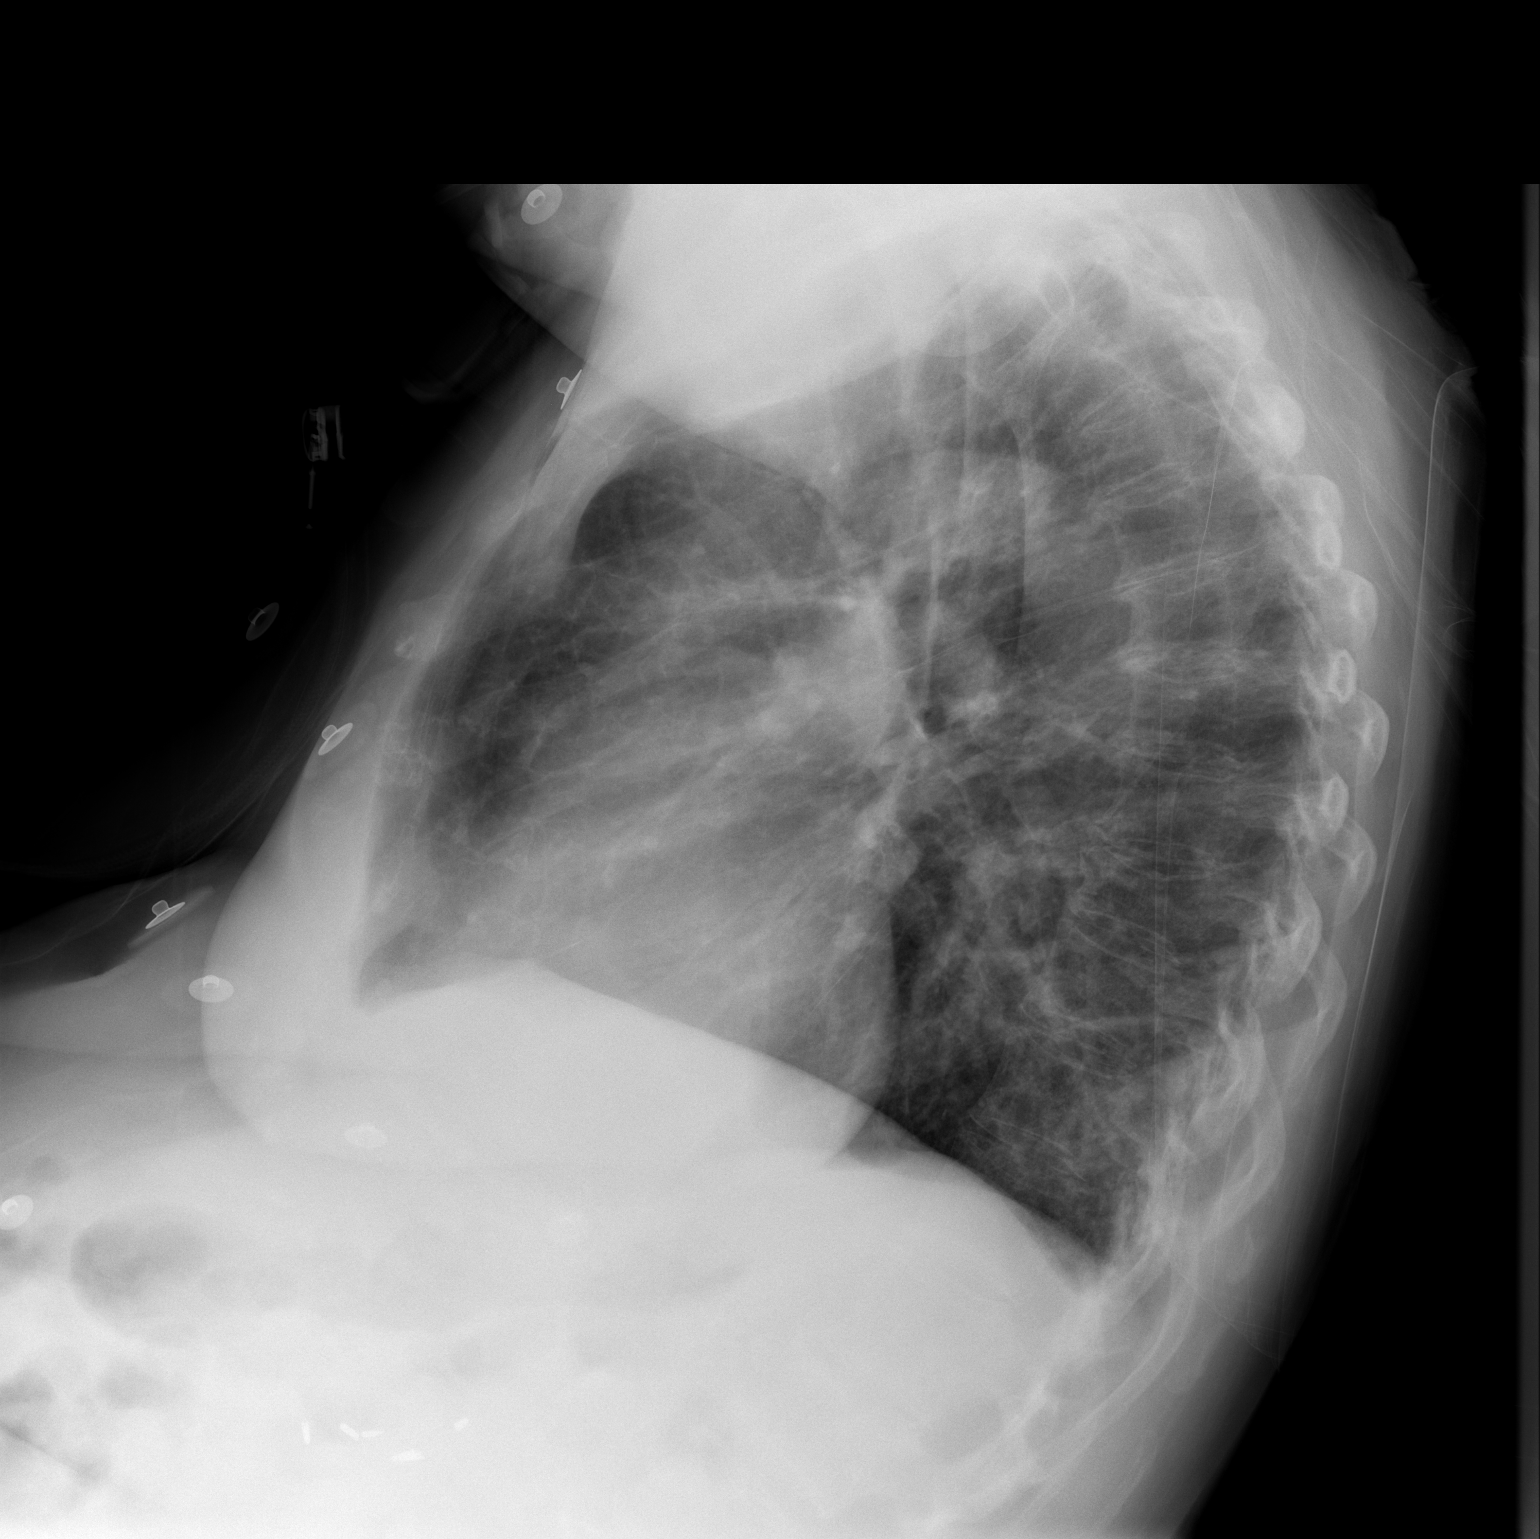

[2 of 2 positions shown; findings below may reference images not displayed]

FINDINGS: The trachea is midline.   The heart size is normal.   There is right base atelectasis.  The lungs are otherwise clear.   No pleural fluid.
IMPRESSION: Right base atelectasis.

## 2007-04-18 ENCOUNTER — Telehealth: Payer: Self-pay | Admitting: *Deleted

## 2007-04-24 ENCOUNTER — Encounter (INDEPENDENT_AMBULATORY_CARE_PROVIDER_SITE_OTHER): Payer: Self-pay | Admitting: *Deleted

## 2007-04-24 LAB — CONVERTED CEMR LAB: LDL Cholesterol: 47 mg/dL

## 2007-05-04 ENCOUNTER — Telehealth (INDEPENDENT_AMBULATORY_CARE_PROVIDER_SITE_OTHER): Payer: Self-pay | Admitting: *Deleted

## 2007-05-11 ENCOUNTER — Ambulatory Visit: Payer: Self-pay | Admitting: Family Medicine

## 2007-05-11 ENCOUNTER — Encounter: Payer: Self-pay | Admitting: Family Medicine

## 2007-05-11 DIAGNOSIS — D638 Anemia in other chronic diseases classified elsewhere: Secondary | ICD-10-CM

## 2007-05-11 DIAGNOSIS — K219 Gastro-esophageal reflux disease without esophagitis: Secondary | ICD-10-CM | POA: Insufficient documentation

## 2007-05-11 DIAGNOSIS — E119 Type 2 diabetes mellitus without complications: Secondary | ICD-10-CM

## 2007-05-11 LAB — CONVERTED CEMR LAB
ALT: 12 units/L (ref 0–35)
AST: 15 units/L (ref 0–37)
Bilirubin Urine: NEGATIVE
CO2: 25 meq/L (ref 19–32)
Creatinine, Ser: 0.54 mg/dL (ref 0.40–1.20)
Direct LDL: 47 mg/dL
Glucose, Urine, Semiquant: NEGATIVE
Ketones, urine, test strip: NEGATIVE
Protein, U semiquant: NEGATIVE
Total Bilirubin: 0.3 mg/dL (ref 0.3–1.2)
WBC, UA: NEGATIVE cells/hpf
pH: 6.5

## 2007-07-05 ENCOUNTER — Emergency Department (HOSPITAL_COMMUNITY): Admission: EM | Admit: 2007-07-05 | Discharge: 2007-07-05 | Payer: Self-pay | Admitting: Emergency Medicine

## 2007-07-06 ENCOUNTER — Telehealth: Payer: Self-pay | Admitting: *Deleted

## 2007-07-11 ENCOUNTER — Encounter (INDEPENDENT_AMBULATORY_CARE_PROVIDER_SITE_OTHER): Payer: Self-pay | Admitting: *Deleted

## 2007-07-11 ENCOUNTER — Telehealth: Payer: Self-pay | Admitting: *Deleted

## 2007-07-13 ENCOUNTER — Encounter (INDEPENDENT_AMBULATORY_CARE_PROVIDER_SITE_OTHER): Payer: Self-pay | Admitting: *Deleted

## 2007-07-13 ENCOUNTER — Telehealth: Payer: Self-pay | Admitting: *Deleted

## 2007-07-13 ENCOUNTER — Ambulatory Visit: Payer: Self-pay | Admitting: Family Medicine

## 2007-07-13 DIAGNOSIS — R32 Unspecified urinary incontinence: Secondary | ICD-10-CM | POA: Insufficient documentation

## 2007-07-25 ENCOUNTER — Telehealth (INDEPENDENT_AMBULATORY_CARE_PROVIDER_SITE_OTHER): Payer: Self-pay | Admitting: *Deleted

## 2007-07-31 ENCOUNTER — Encounter (INDEPENDENT_AMBULATORY_CARE_PROVIDER_SITE_OTHER): Payer: Self-pay | Admitting: *Deleted

## 2007-08-15 ENCOUNTER — Encounter (INDEPENDENT_AMBULATORY_CARE_PROVIDER_SITE_OTHER): Payer: Self-pay | Admitting: *Deleted

## 2007-09-11 ENCOUNTER — Encounter (INDEPENDENT_AMBULATORY_CARE_PROVIDER_SITE_OTHER): Payer: Self-pay | Admitting: *Deleted

## 2007-10-05 ENCOUNTER — Ambulatory Visit: Payer: Self-pay | Admitting: Family Medicine

## 2007-11-03 ENCOUNTER — Telehealth: Payer: Self-pay | Admitting: *Deleted

## 2007-11-08 ENCOUNTER — Ambulatory Visit: Payer: Self-pay | Admitting: Family Medicine

## 2007-11-23 ENCOUNTER — Ambulatory Visit (HOSPITAL_COMMUNITY): Admission: RE | Admit: 2007-11-23 | Discharge: 2007-11-23 | Payer: Self-pay | Admitting: *Deleted

## 2008-01-19 ENCOUNTER — Encounter (INDEPENDENT_AMBULATORY_CARE_PROVIDER_SITE_OTHER): Payer: Self-pay | Admitting: *Deleted

## 2008-01-19 ENCOUNTER — Emergency Department (HOSPITAL_COMMUNITY): Admission: EM | Admit: 2008-01-19 | Discharge: 2008-01-19 | Payer: Self-pay | Admitting: Emergency Medicine

## 2008-01-25 ENCOUNTER — Emergency Department (HOSPITAL_COMMUNITY): Admission: EM | Admit: 2008-01-25 | Discharge: 2008-01-25 | Payer: Self-pay | Admitting: Emergency Medicine

## 2008-01-29 ENCOUNTER — Ambulatory Visit: Payer: Self-pay | Admitting: Family Medicine

## 2008-01-29 LAB — CONVERTED CEMR LAB: Hgb A1c MFr Bld: 5.7 %

## 2008-02-13 ENCOUNTER — Encounter (INDEPENDENT_AMBULATORY_CARE_PROVIDER_SITE_OTHER): Payer: Self-pay | Admitting: *Deleted

## 2008-02-21 IMAGING — CR DG LUMBAR SPINE COMPLETE 4+V
5 series · 5 of 5 positions shown · non-contrast
Comparison: none

CLINICAL DATA: Fell in bathtub with low back pain.
 LUMBAR SPINE COMPLETE ? 4 VIEWS:

[t l-spine a.p.]
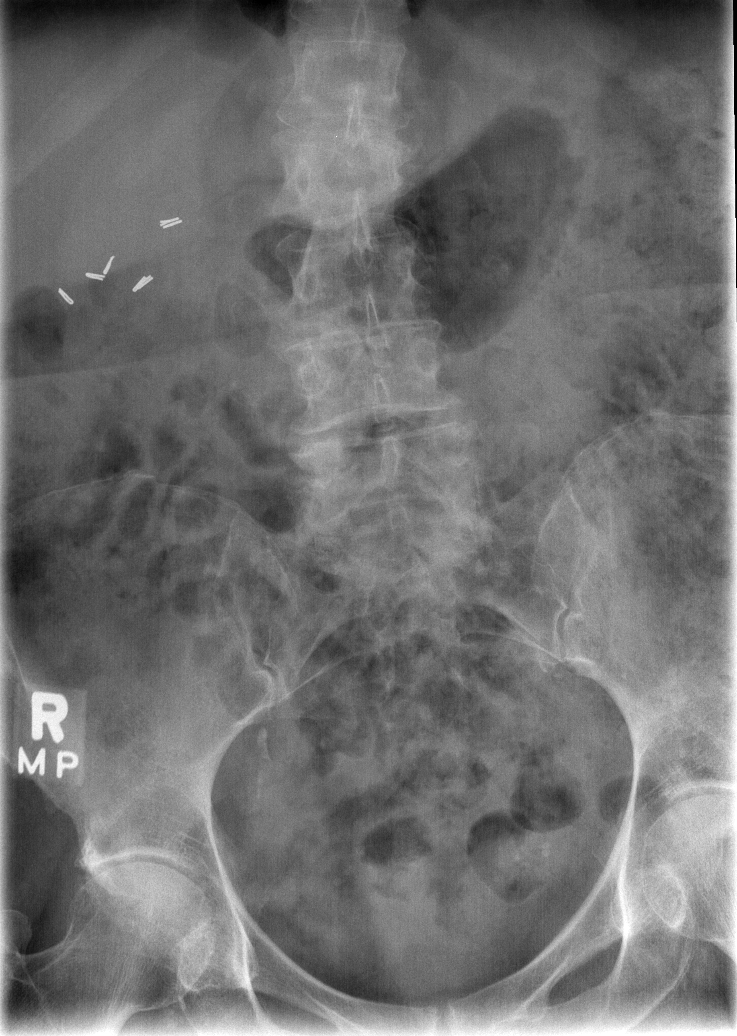

[t l-spine oblique exposure (1 of 2)]
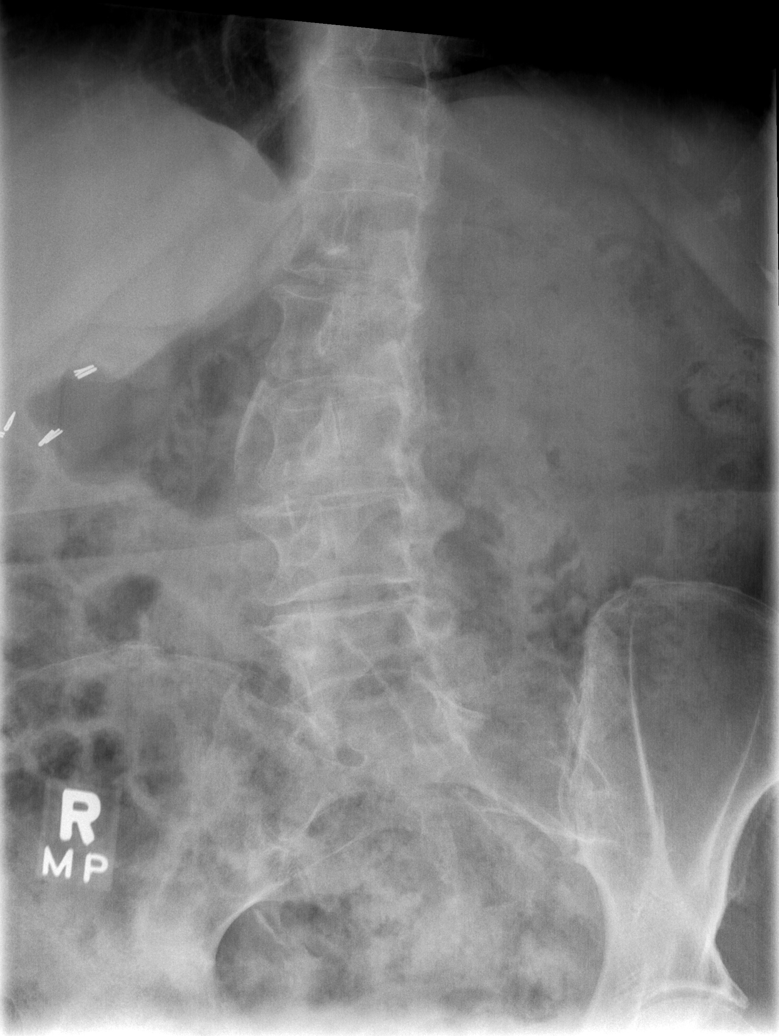

[t l-spine oblique exposure (2 of 2)]
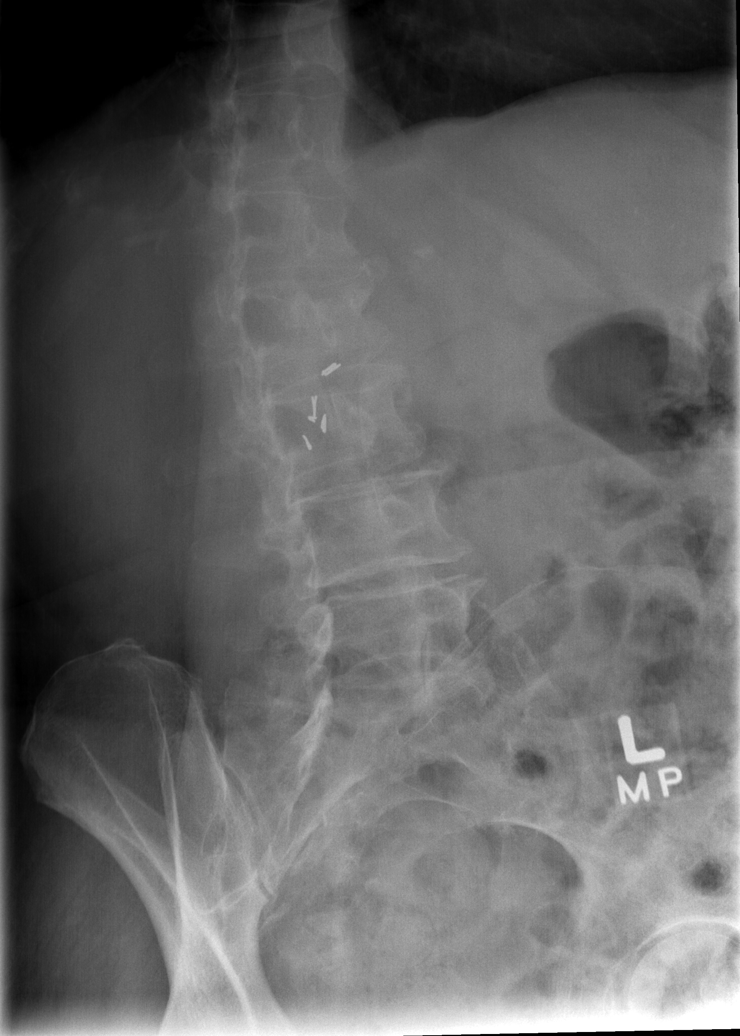

[t l-spine lat]
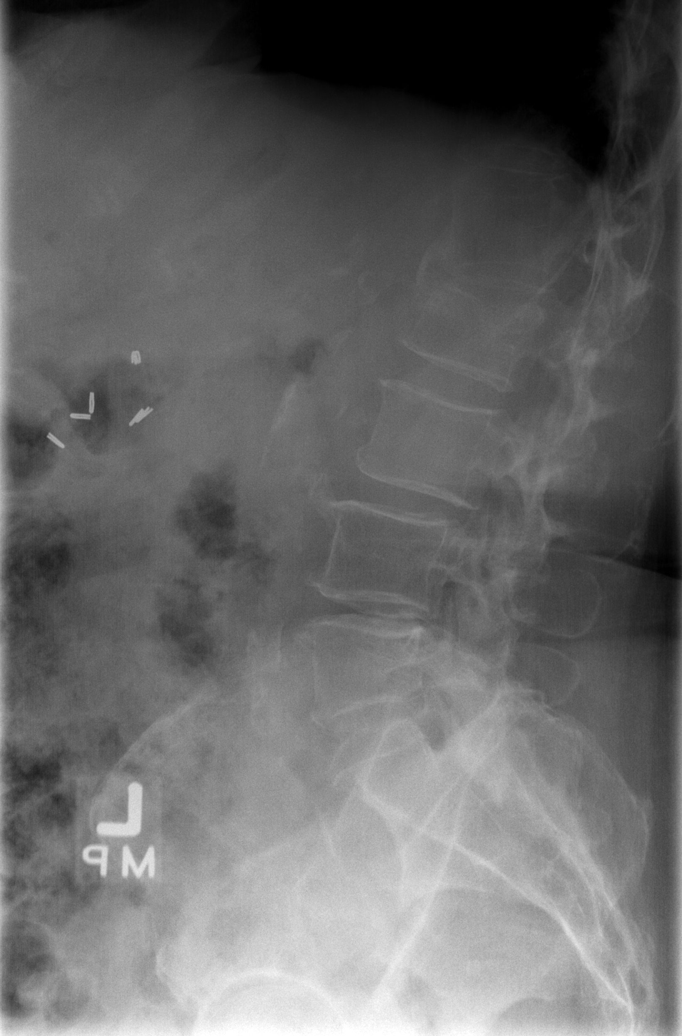

[t l-spine l5-s1 spot]
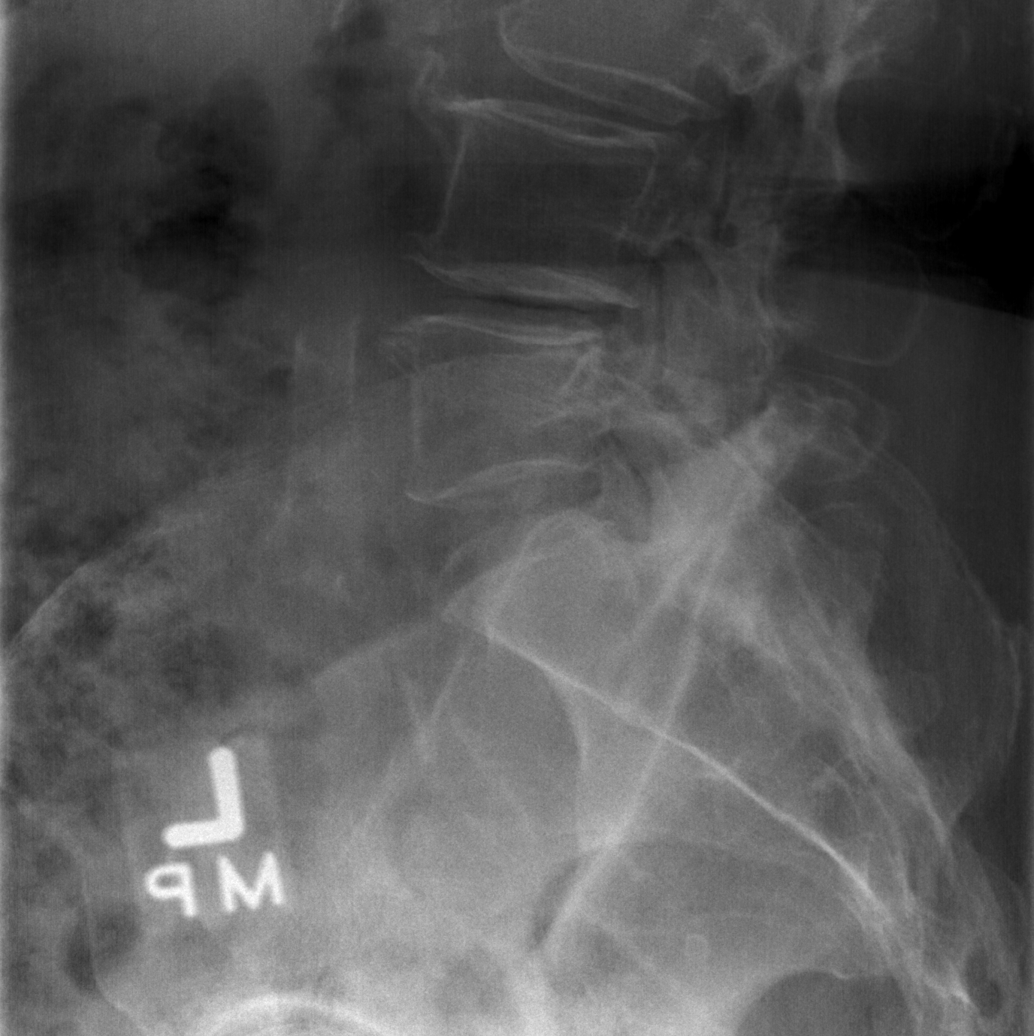

[5 of 5 positions shown; findings below may reference images not displayed]

FINDINGS: Four views of the lumbar spine were obtained. The lumbar vertebrae are in normal alignment with only slight loss of disk space at L4-5.  There does appear to be slight compression of the anterior-superior aspect of the L2 vertebral body. This could be acute or subacute and CT through this region may be helpful to assess further.  No significant retropulsion is noted. The sacroiliac joints appear normal.  The bowel gas pattern is nonspecific.
IMPRESSION: Probable mild compression deformity of the anterior-superior aspect of L2.  Consider CT through this region to assess further if warranted clinically.

## 2008-02-21 IMAGING — CT CT HEAD W/O CM
1 of 2 series · 16 of 30 positions shown, 20 images · IV contrast (agent unspecified)
Comparison: none

CLINICAL DATA: Status-post fall, head injury.
 HEAD CT WITHOUT CONTRAST:
TECHNIQUE: Contiguous axial images were obtained from the base of the skull through the vertex according to standard protocol without contrast.    
 Comparison examination: 02/20/07

[Series 2: headseq 4.8 h45s · axial · 0.43mm/px · z∈[-145,+7]mm · 16 of 36 slices shown, 20 images]
[im 2/36  brain]
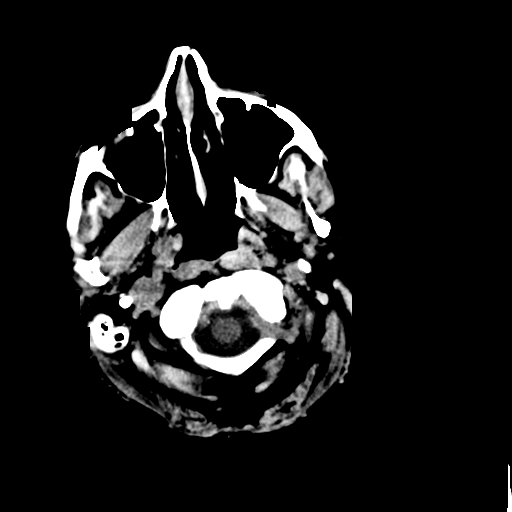
[im 2/36  bone]
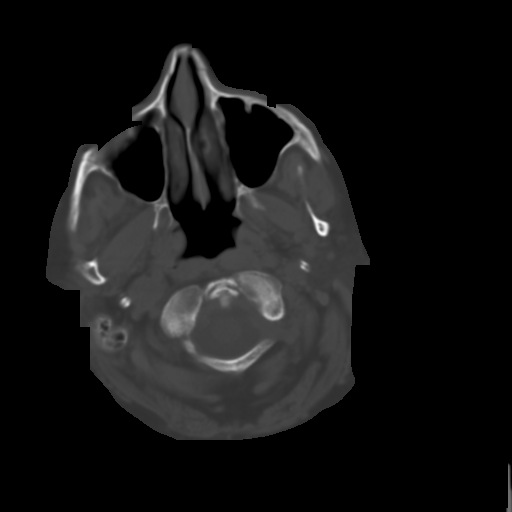
[im 4/36  brain]
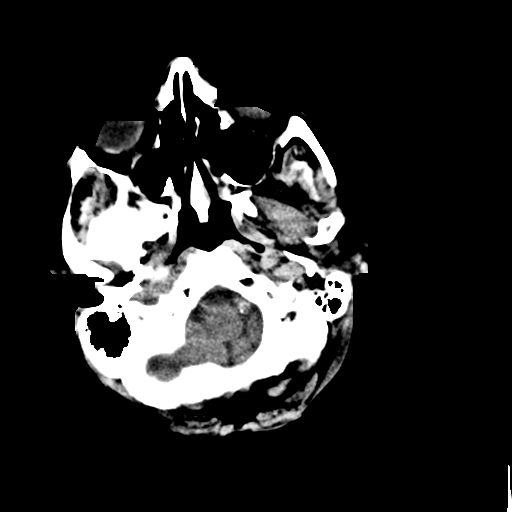
[im 6/36  brain]
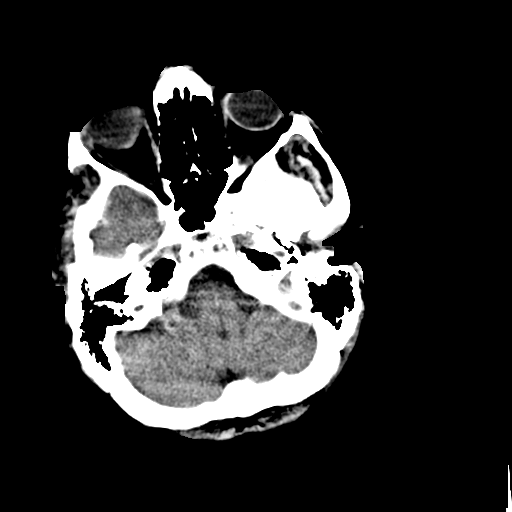
[im 8/36  brain]
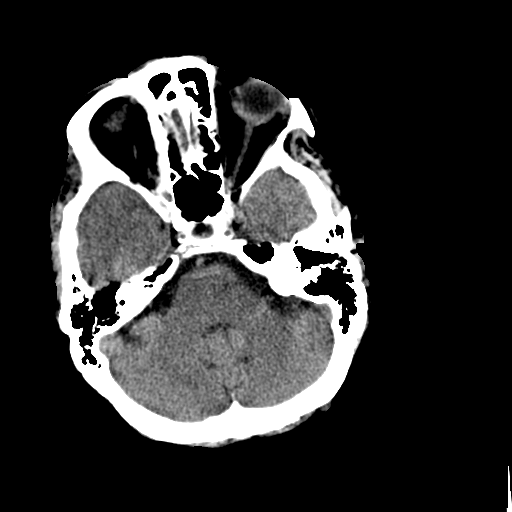
[im 12/36  brain]
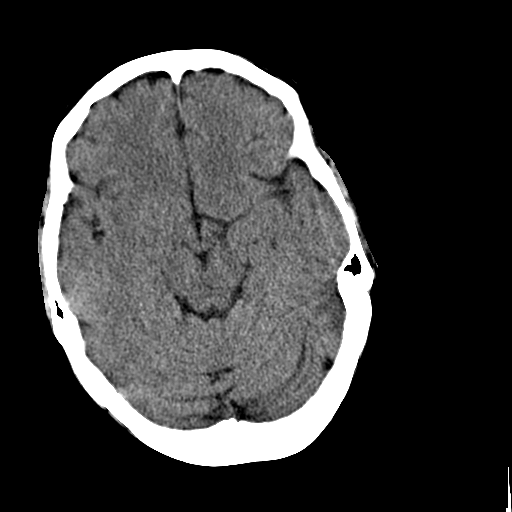
[im 12/36  bone]
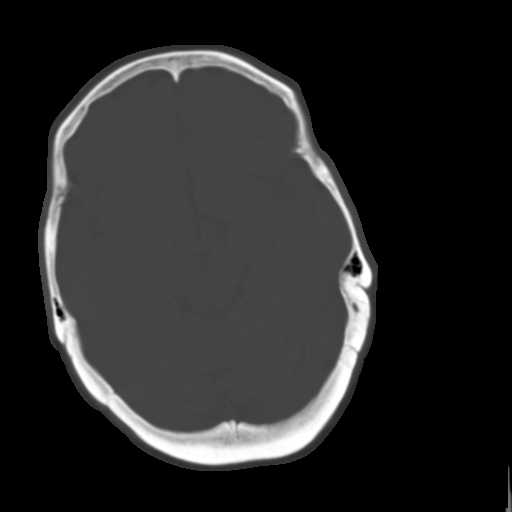
[im 13/36  brain]
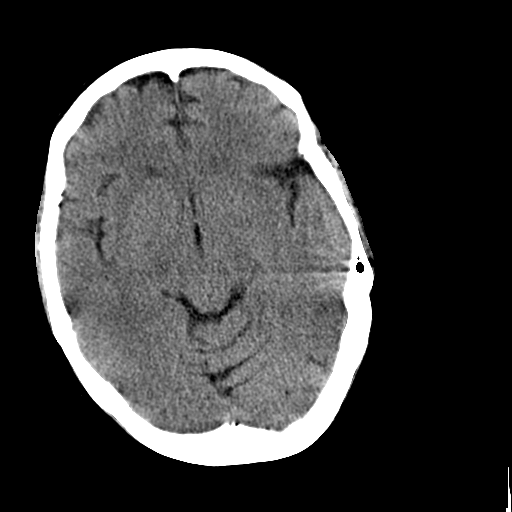
[im 15/36  brain]
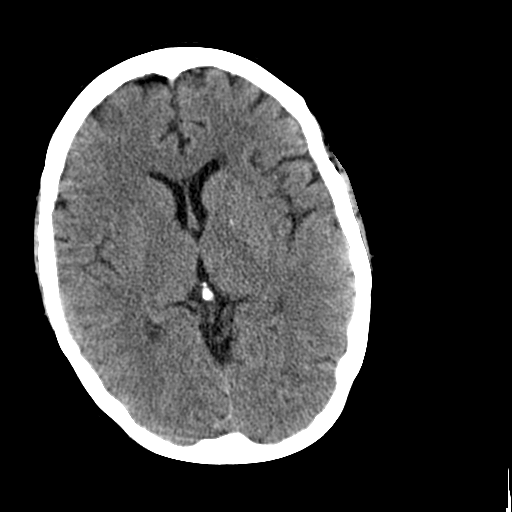
[im 17/36  brain]
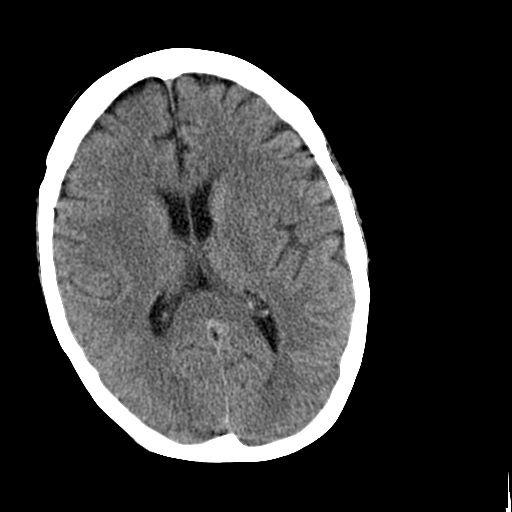
[im 19/36  brain]
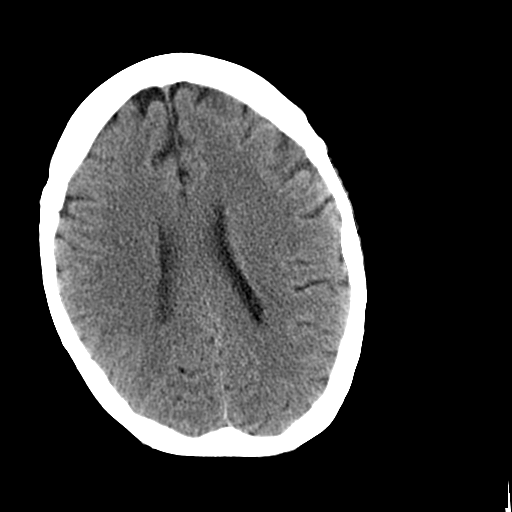
[im 19/36  bone]
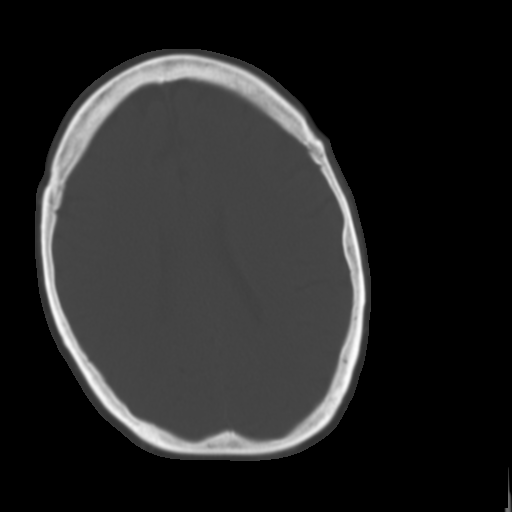
[im 21/36  brain]
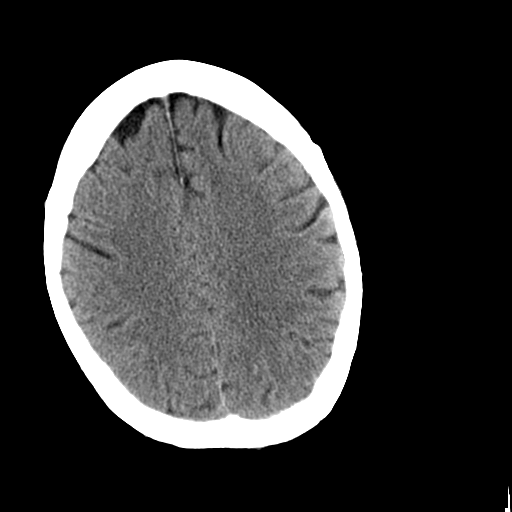
[im 23/36  brain]
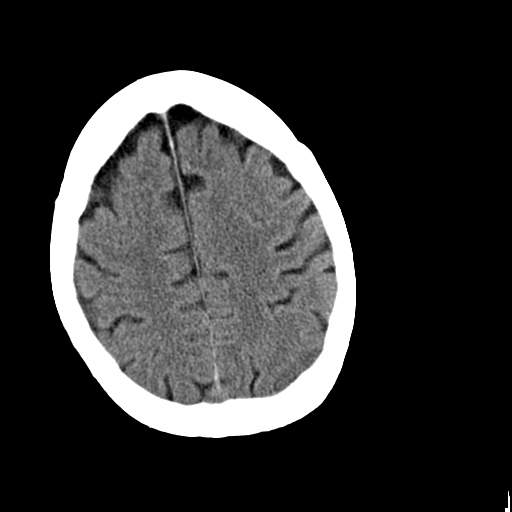
[im 24/36  brain]
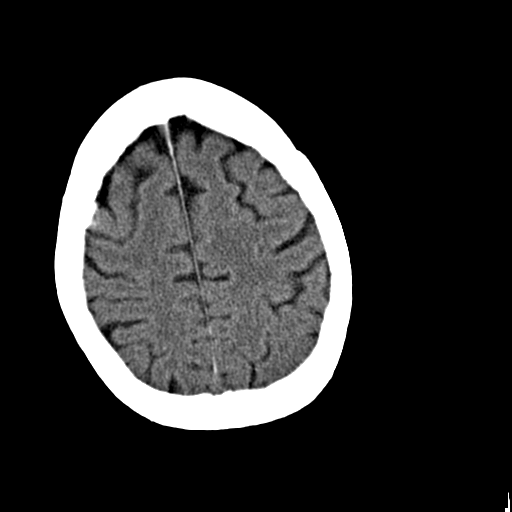
[im 28/36  brain]
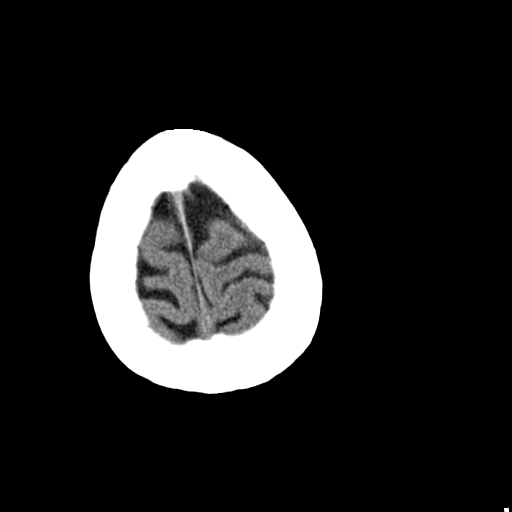
[im 28/36  bone]
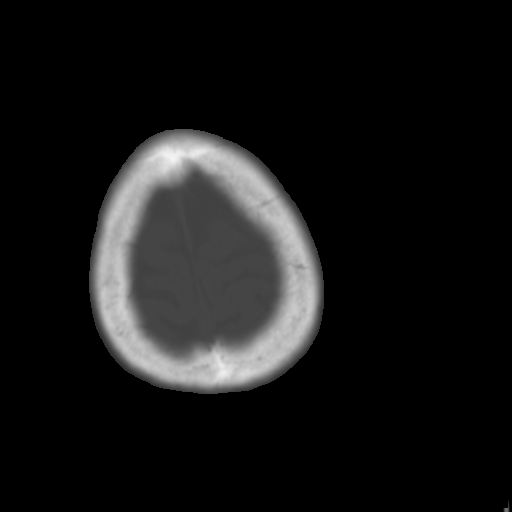
[im 30/36  brain]
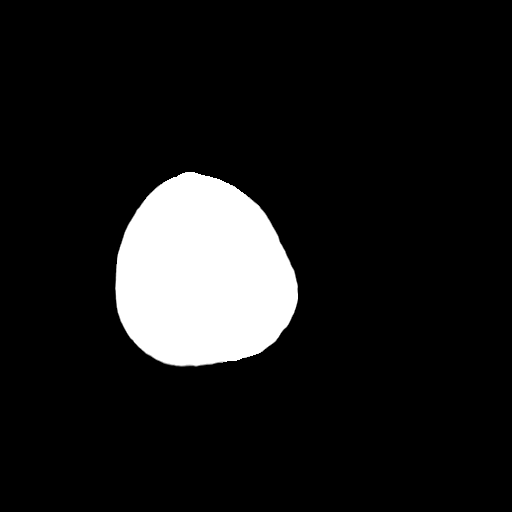
[im 32/36  brain]
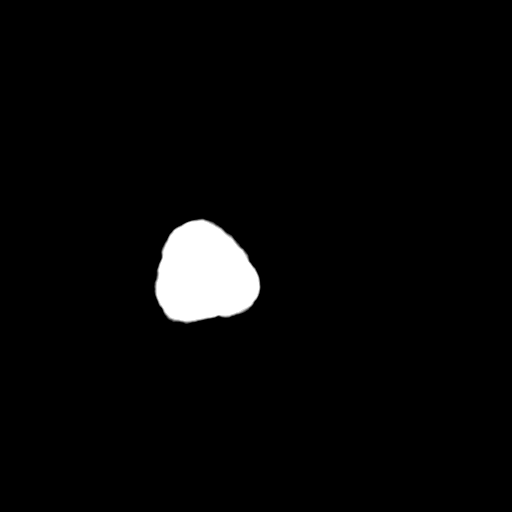
[im 34/36  brain]
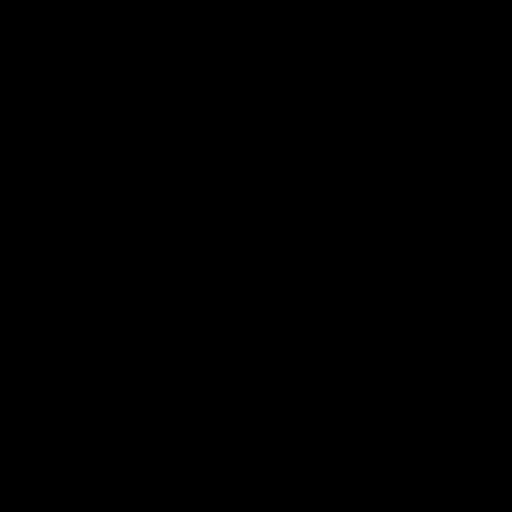

[16 of 30 positions shown; findings below may reference images not displayed]

FINDINGS: No depressed skull fracture is seen. The visualized paranasal sinuses are unremarkable. There is no intracranial hemorrhage, mass effect, or midline shift. There is no hydrocephalus. There is no intra or extraaxial fluid collection. A punctate calcification is noted in the left basal ganglia. The gray and white matter differentiation is preserved.
IMPRESSION: No intracranial hemorrhage, mass effect, or midline shift. No depressed skull fracture.

## 2008-02-27 IMAGING — CT CT CERVICAL SPINE W/O CM
3 of 4 series · 13 of 27 positions shown, 15 images · non-contrast
Comparison: Head CT scan dated 01/19/08 and cervical spine 03/19/07.

CLINICAL DATA: Fall. 
 HEAD CT WITHOUT CONTRAST ? 01/25/08:
TECHNIQUE: Contiguous axial images were obtained from the base of the skull through the vertex according to standard protocol without contrast.
TECHNIQUE: Multidetector CT imaging of the cervical spine was performed.  Multiplanar CT image reconstructions were also generated.

[Series 6: c_spine 2.0 b31s · axial · 0.23mm/px · z∈[+1088,+1144]mm · 2 of 86 slices shown, 3 images]
[im 29/86  soft-tissue]
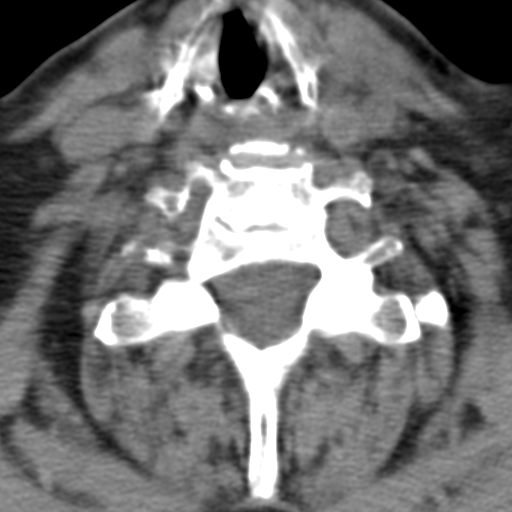
[im 29/86  bone]
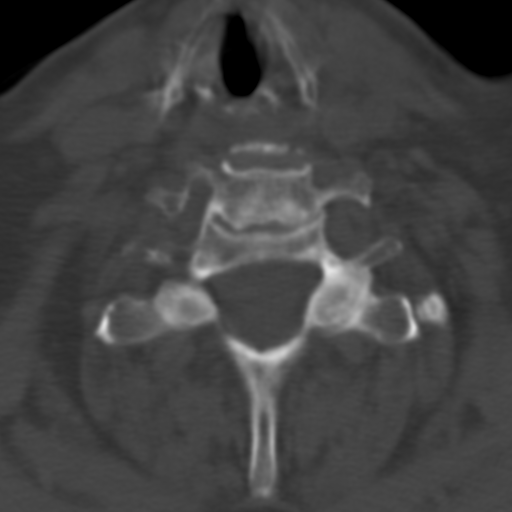
[im 57/86  bone]
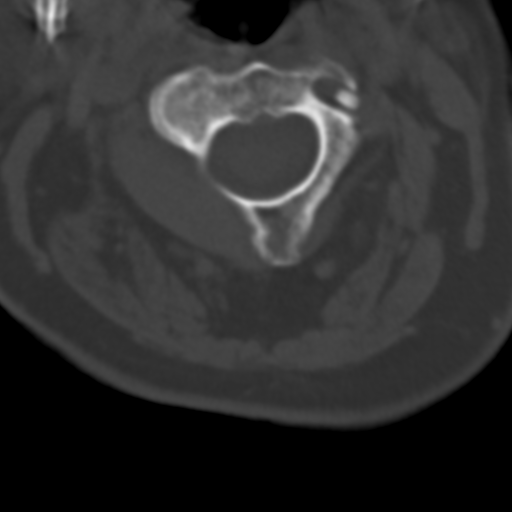

[Series 603: <mpr range> · sagittal · 0.34mm/px · 5 of 66 slices shown, 6 images]
[im 22/66  bone]
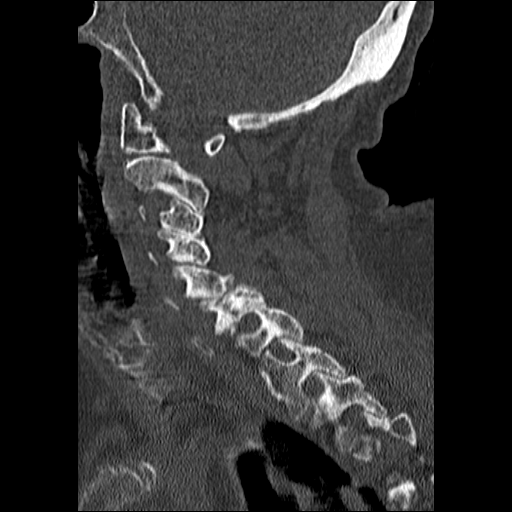
[im 28/66  bone]
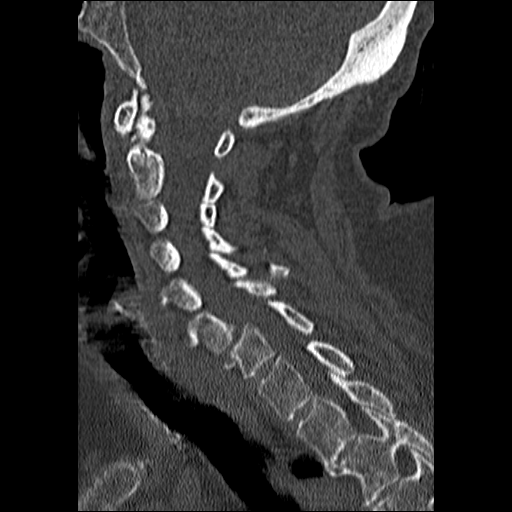
[im 33/66  soft-tissue]
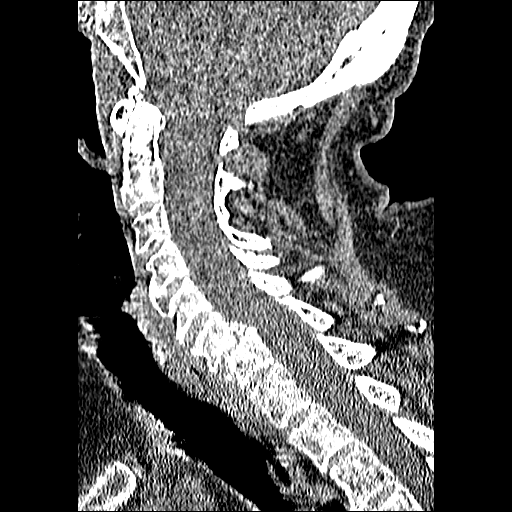
[im 33/66  bone]
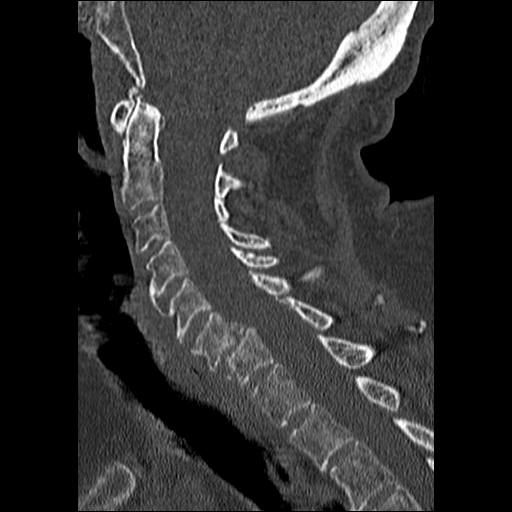
[im 38/66  bone]
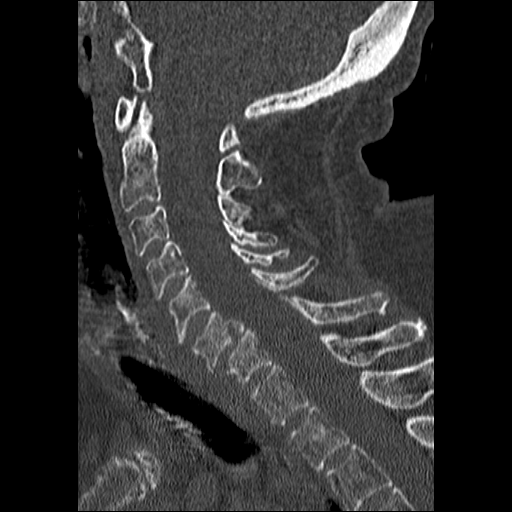
[im 44/66  bone]
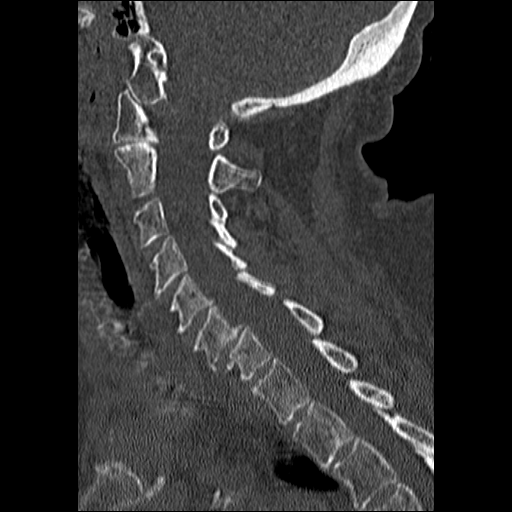

[Series 604: <mpr range(1)> · axial · 0.34mm/px · z∈[+1024,+1114]mm · 6 of 153 slices shown]
[im 22/153  bone]
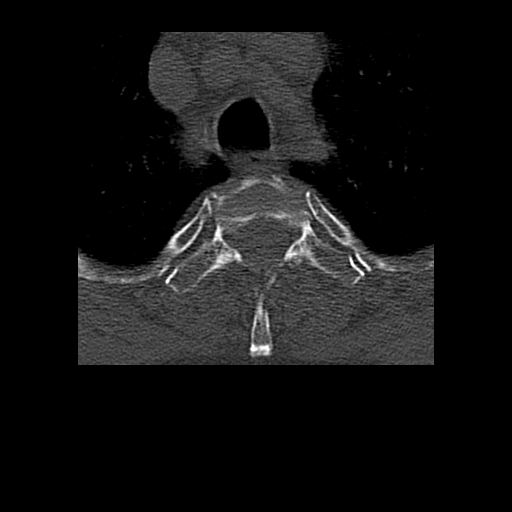
[im 44/153  bone]
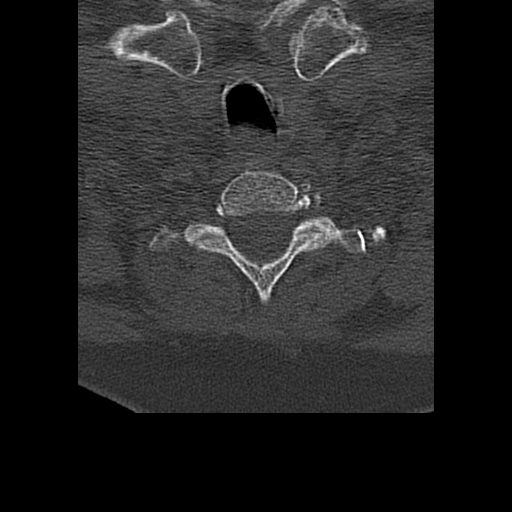
[im 66/153  bone]
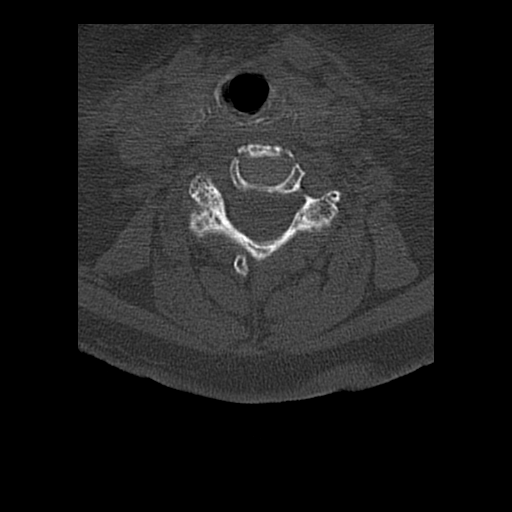
[im 87/153  bone]
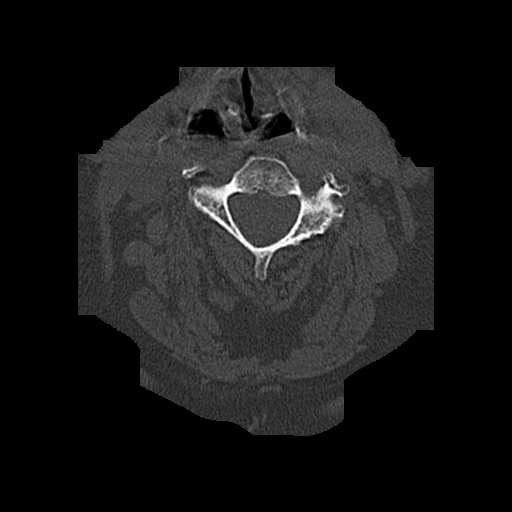
[im 109/153  bone]
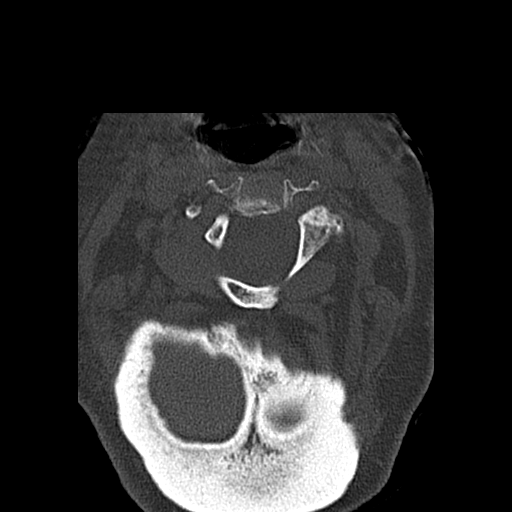
[im 131/153  bone]
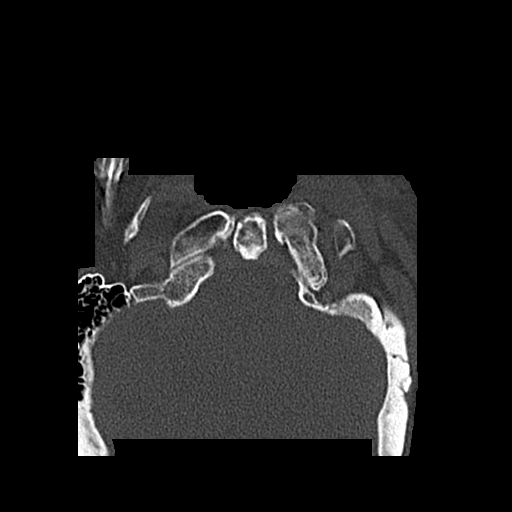

[13 of 27 positions shown; findings below may reference images not displayed]

FINDINGS: There is no evidence of acute intracranial abnormality including hemorrhage, infarct, mass, mass effect, midline shift or abnormal extra-axial fluid collection.  No hydrocephalus.  The imaged paranasal sinuses and mastoid air cells are clear and the calvarium is intact.
IMPRESSION: No acute intracranial abnormality.
 CERVICAL SPINE CT WITHOUT CONTRAST:
FINDINGS: As seen on the patient?s 02/20/07 study, there is fracture of the posterior and inferior endplate of C6 with anterolisthesis of C6 on C7 of approximately 0.6 cm. Anterolisthesis has increased since the prior study where it measured approximately 0.4 cm. There is an old left laminar fracture of C4.  No acute fracture is identified.  Lung apices are clear.  No epidural hematoma.
IMPRESSION: Remote fracture of C6 with anterolisthesis of C6 on C7 again seen.   Anterolisthesis has increased from approximately 0.4 cm to approximately 0.6 cm.  There is no acute fracture present.

## 2008-03-05 ENCOUNTER — Telehealth: Payer: Self-pay | Admitting: *Deleted

## 2008-03-07 ENCOUNTER — Ambulatory Visit: Payer: Self-pay | Admitting: Family Medicine

## 2008-03-25 ENCOUNTER — Ambulatory Visit: Payer: Self-pay | Admitting: Family Medicine

## 2008-03-25 DIAGNOSIS — I872 Venous insufficiency (chronic) (peripheral): Secondary | ICD-10-CM | POA: Insufficient documentation

## 2008-04-01 ENCOUNTER — Telehealth (INDEPENDENT_AMBULATORY_CARE_PROVIDER_SITE_OTHER): Payer: Self-pay | Admitting: *Deleted

## 2008-05-22 ENCOUNTER — Encounter (INDEPENDENT_AMBULATORY_CARE_PROVIDER_SITE_OTHER): Payer: Self-pay | Admitting: *Deleted

## 2008-05-22 DIAGNOSIS — I279 Pulmonary heart disease, unspecified: Secondary | ICD-10-CM | POA: Insufficient documentation

## 2008-05-23 ENCOUNTER — Ambulatory Visit: Payer: Self-pay | Admitting: *Deleted

## 2008-05-27 ENCOUNTER — Encounter (INDEPENDENT_AMBULATORY_CARE_PROVIDER_SITE_OTHER): Payer: Self-pay | Admitting: *Deleted

## 2008-05-28 ENCOUNTER — Encounter (INDEPENDENT_AMBULATORY_CARE_PROVIDER_SITE_OTHER): Payer: Self-pay | Admitting: *Deleted

## 2008-06-08 ENCOUNTER — Emergency Department (HOSPITAL_COMMUNITY): Admission: EM | Admit: 2008-06-08 | Discharge: 2008-06-08 | Payer: Self-pay | Admitting: Emergency Medicine

## 2008-06-09 ENCOUNTER — Emergency Department (HOSPITAL_COMMUNITY): Admission: EM | Admit: 2008-06-09 | Discharge: 2008-06-09 | Payer: Self-pay | Admitting: Emergency Medicine

## 2008-06-13 ENCOUNTER — Telehealth: Payer: Self-pay | Admitting: *Deleted

## 2008-06-14 ENCOUNTER — Encounter: Payer: Self-pay | Admitting: Family Medicine

## 2008-06-17 ENCOUNTER — Encounter: Payer: Self-pay | Admitting: Family Medicine

## 2008-06-19 ENCOUNTER — Telehealth: Payer: Self-pay | Admitting: *Deleted

## 2008-06-25 ENCOUNTER — Encounter: Payer: Self-pay | Admitting: Family Medicine

## 2008-07-03 ENCOUNTER — Encounter: Payer: Self-pay | Admitting: Family Medicine

## 2008-07-11 IMAGING — CR DG LUMBAR SPINE COMPLETE 4+V
5 series · 5 of 5 positions shown · non-contrast
Comparison: 01/19/2008, CT abdomen pelvis 02/20/2007.

CLINICAL DATA: 66-year-old female with fall and low back pain.

LUMBAR SPINE - COMPLETE 4+ VIEW

[t l-spine a.p.]
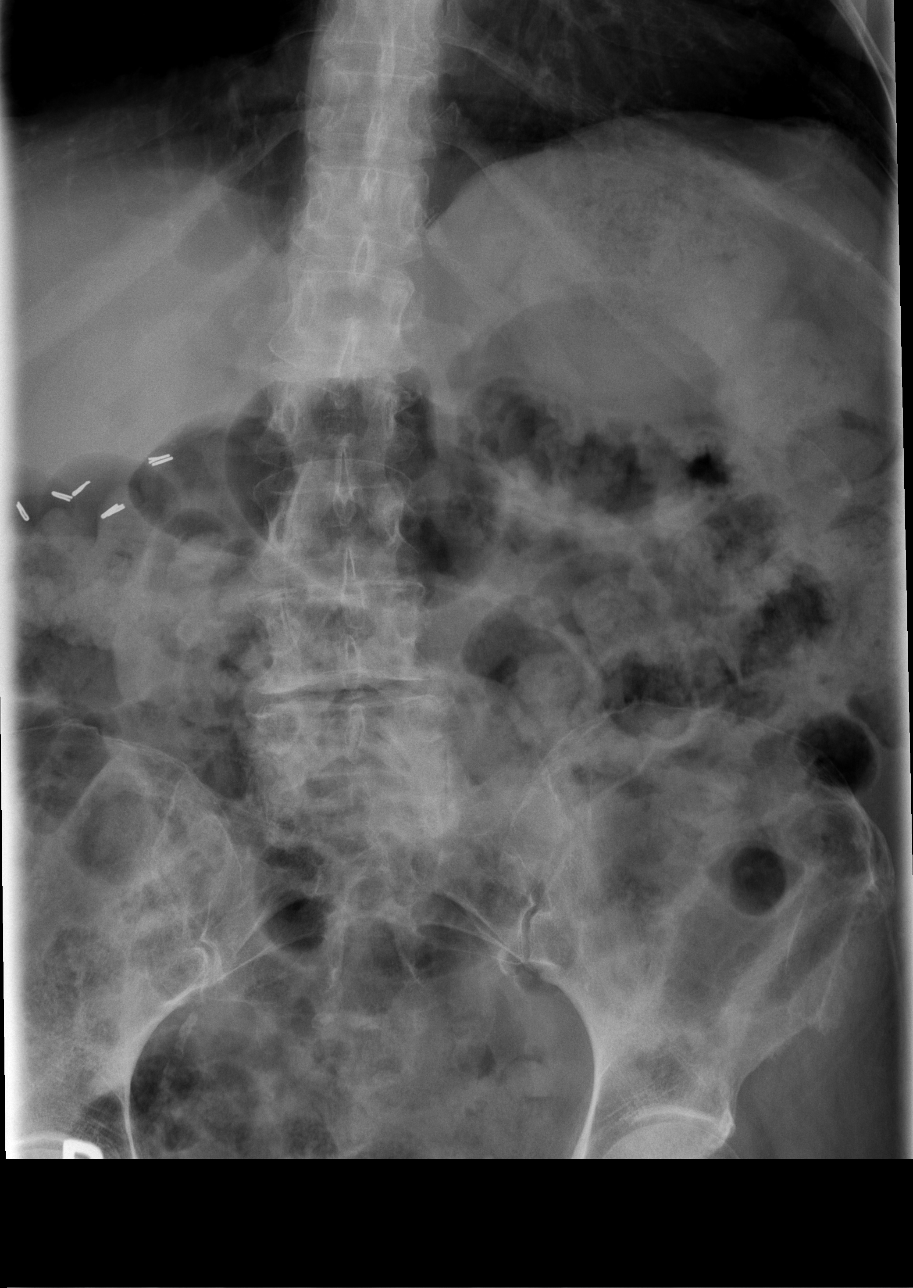

[t l-spine oblique exposure (1 of 2)]
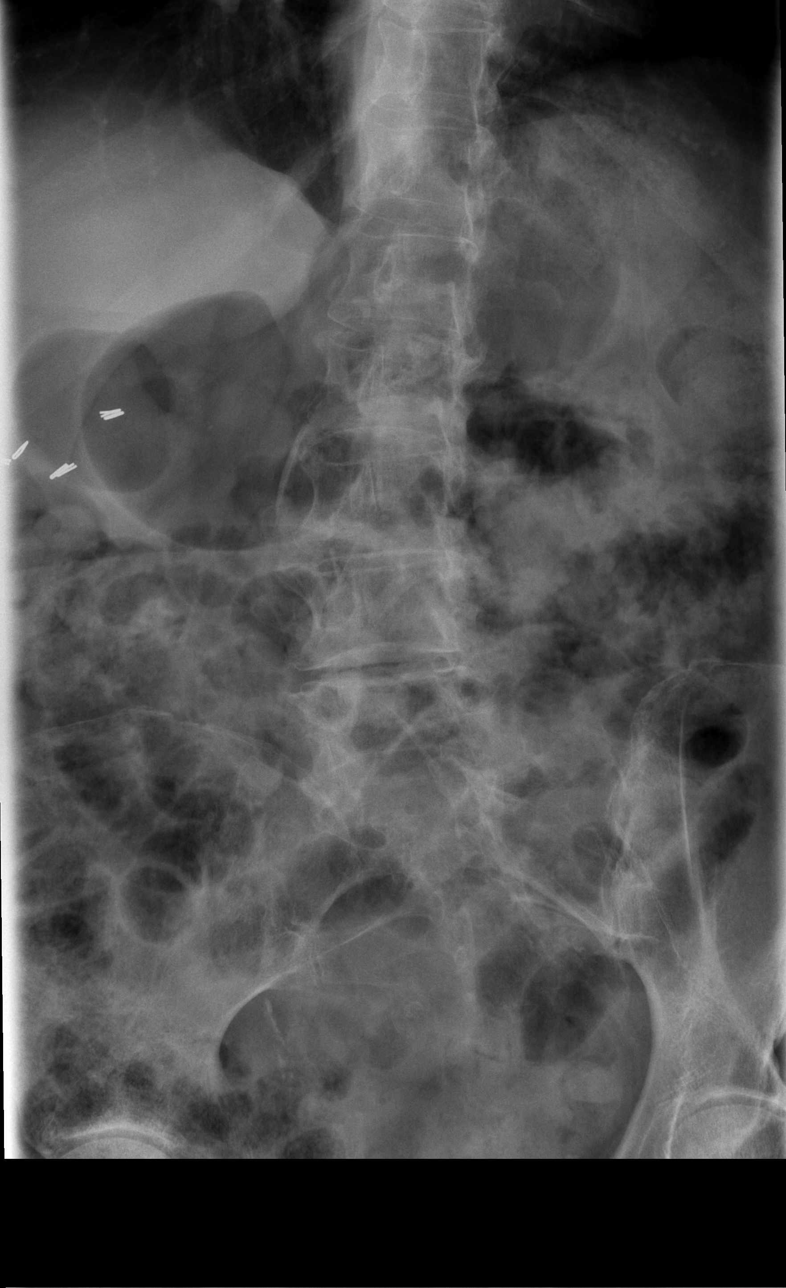

[t l-spine oblique exposure (2 of 2)]
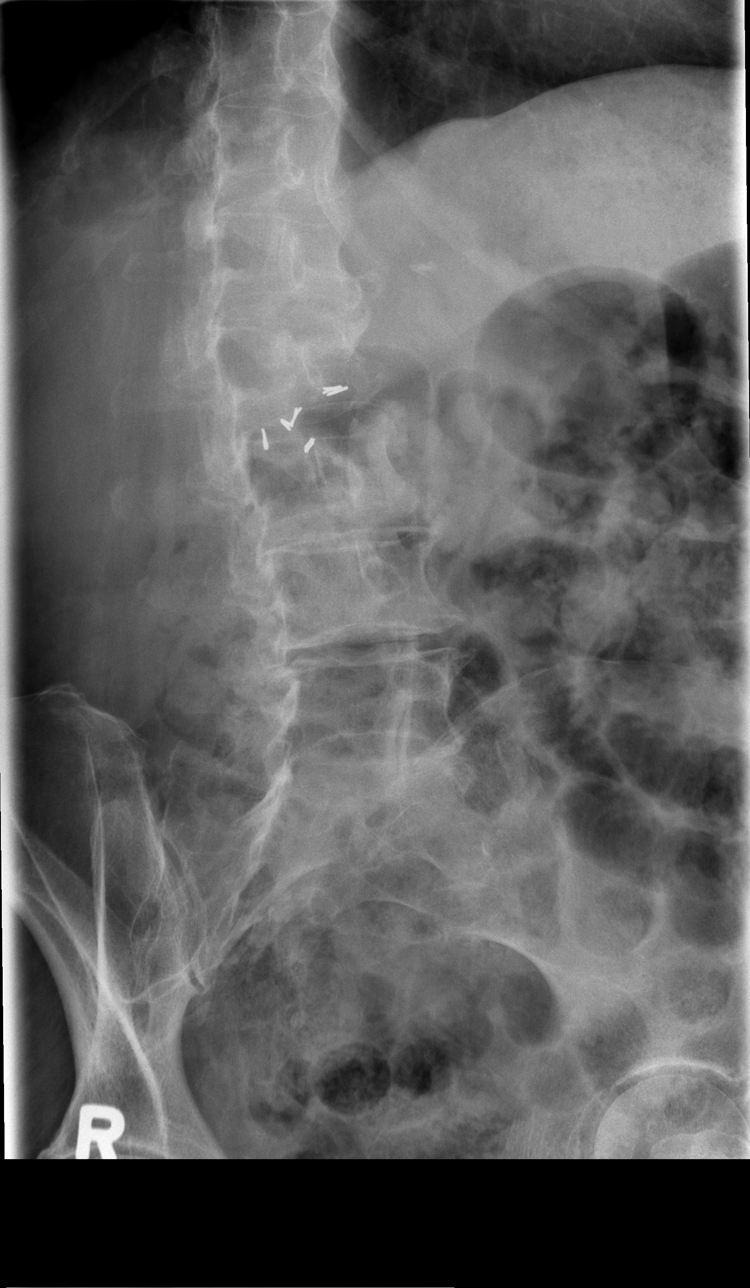

[t l-spine lat]
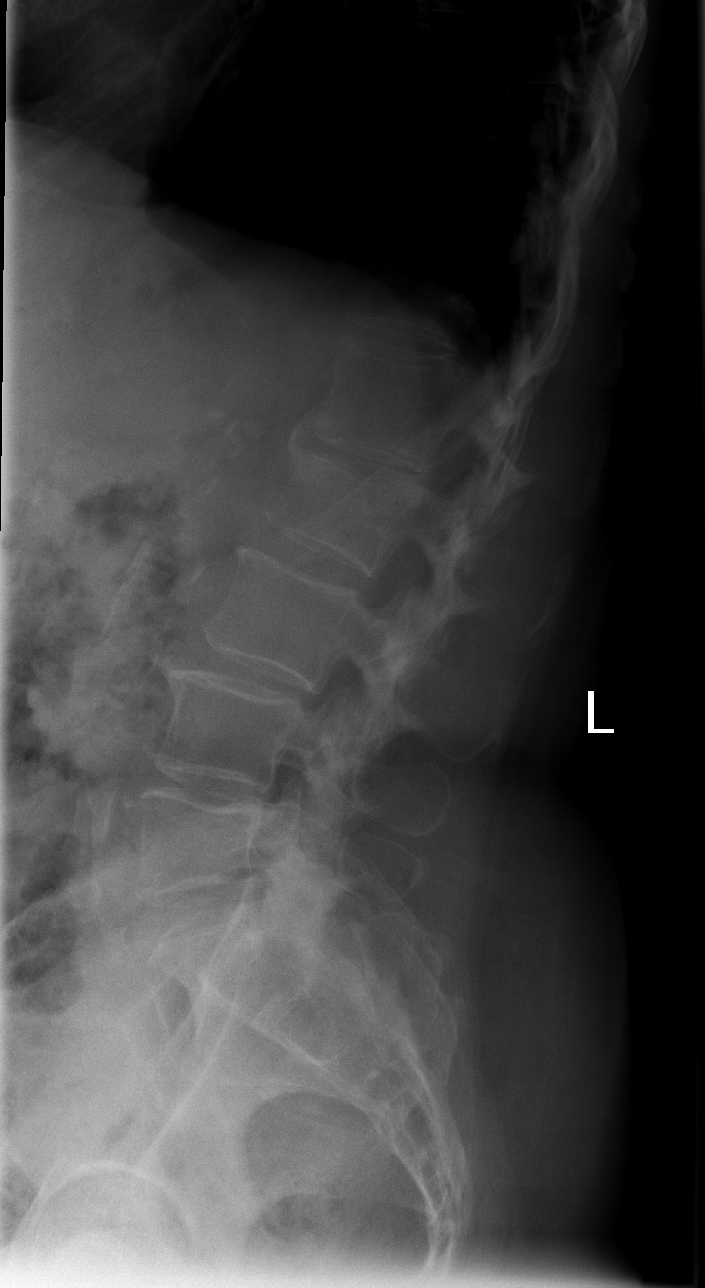

[t l-spine l5-s1 spot *]
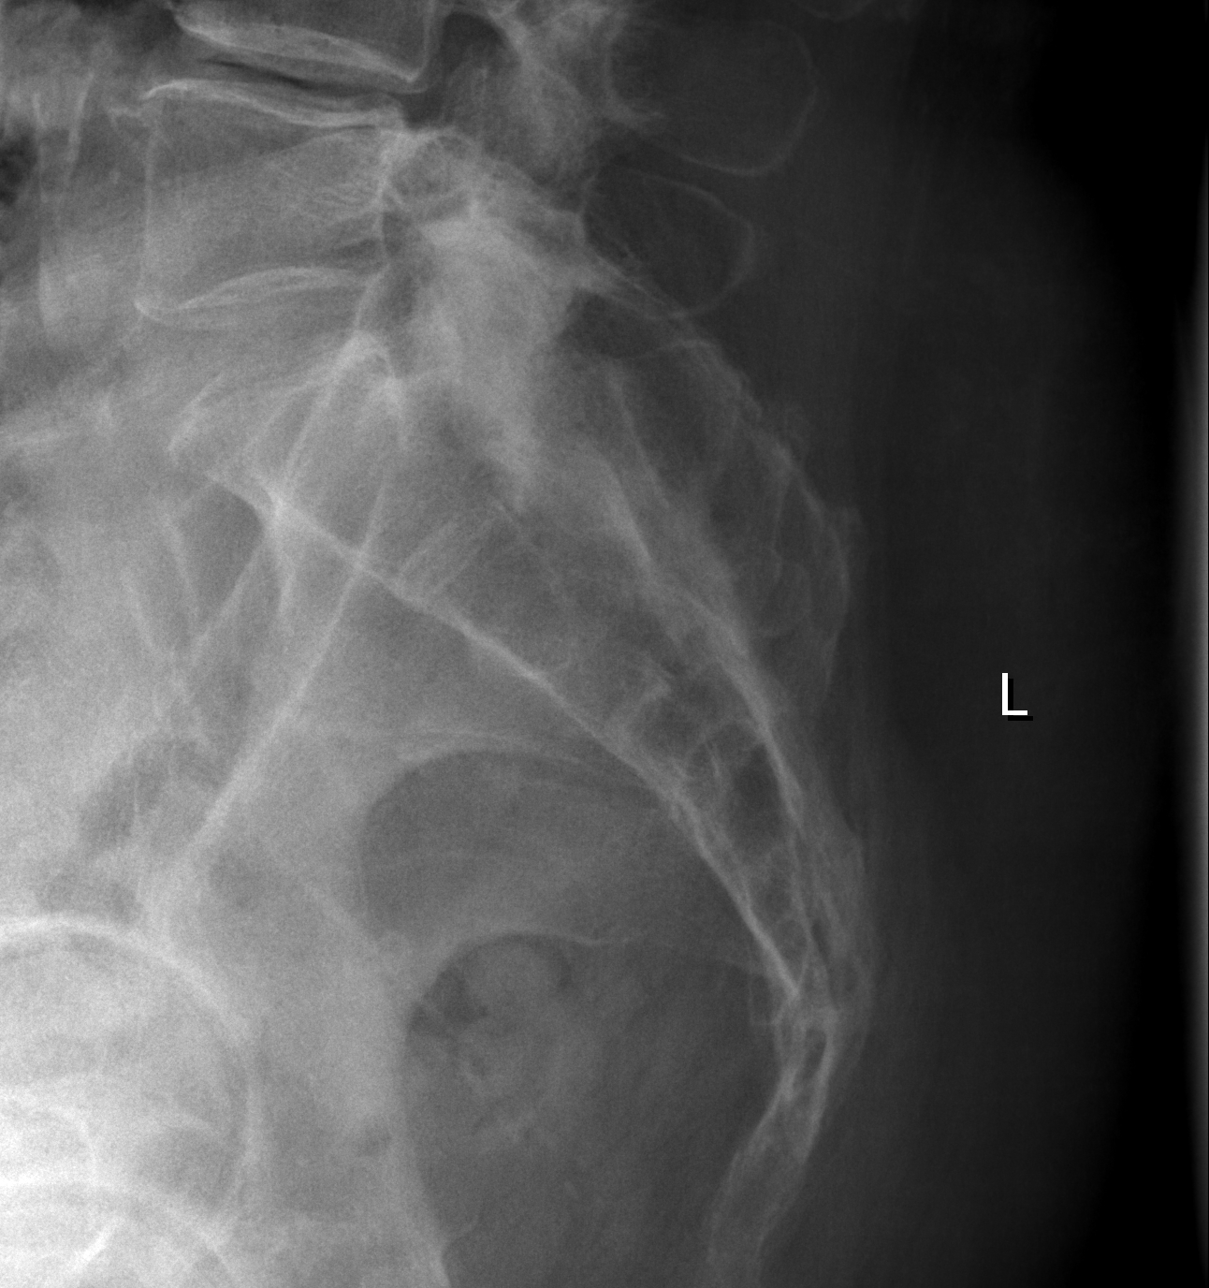

[5 of 5 positions shown; findings below may reference images not displayed]

FINDINGS: Abundance of bowel gas and stool similar to the prior
exam.  Stable surgical clips right upper quadrant.  Normal lumbar
segmentation again noted.  Stable loss of L2 vertebral body height
and sclerosis of the superior endplate.  Stable height alignment of
other lumbar vertebrae.  Endplate osteophytes again noted.  L5-S1
facet arthropathy.  No pars fracture.  Visualized sacrum and pelvis
appear intact.
IMPRESSION: No acute fracture or listhesis identified in the lumbar spine.
Chronic L2 superior endplate fracture.  Stable degenerative
changes.

## 2008-07-12 IMAGING — CR DG SHOULDER 2+V*R*
3 series · 3 of 3 positions shown · non-contrast
Comparison: 02/19/2007

CLINICAL DATA: Pain post fall

RIGHT SHOULDER - 2+ VIEW

[view not recorded (1 of 3)]
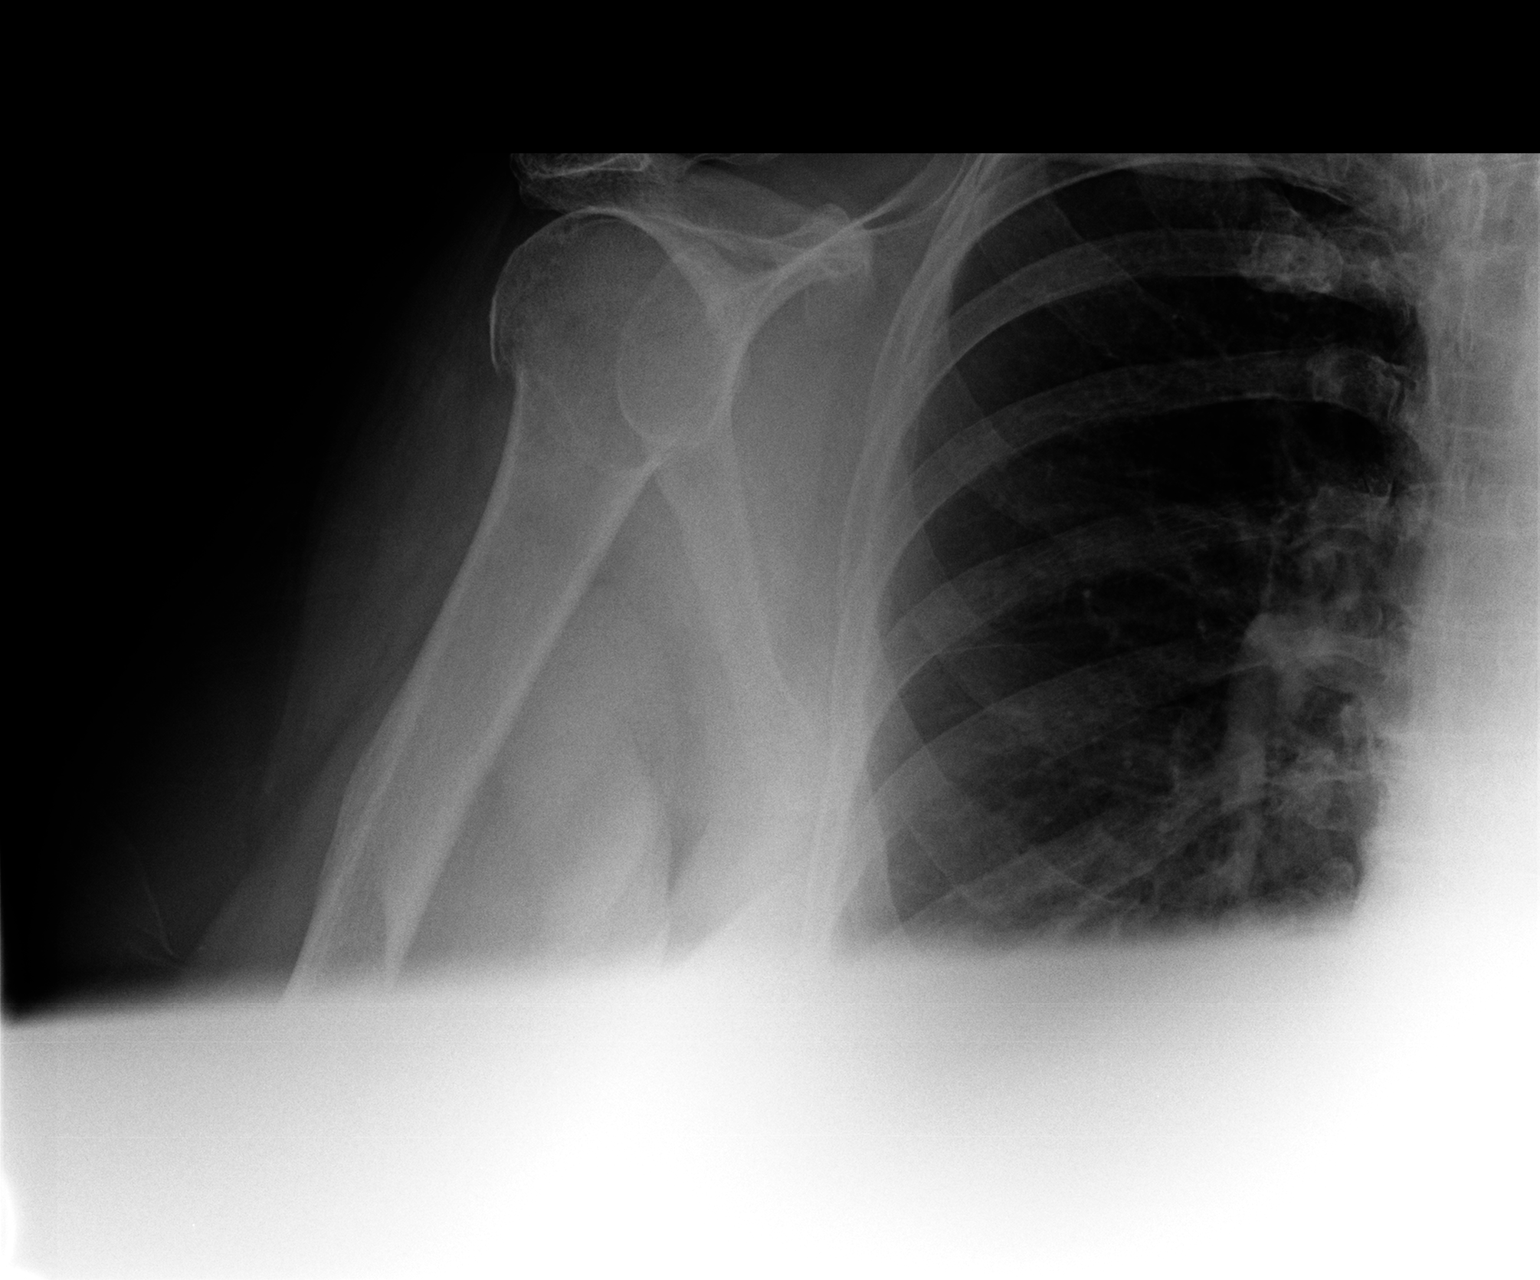

[view not recorded (2 of 3)]
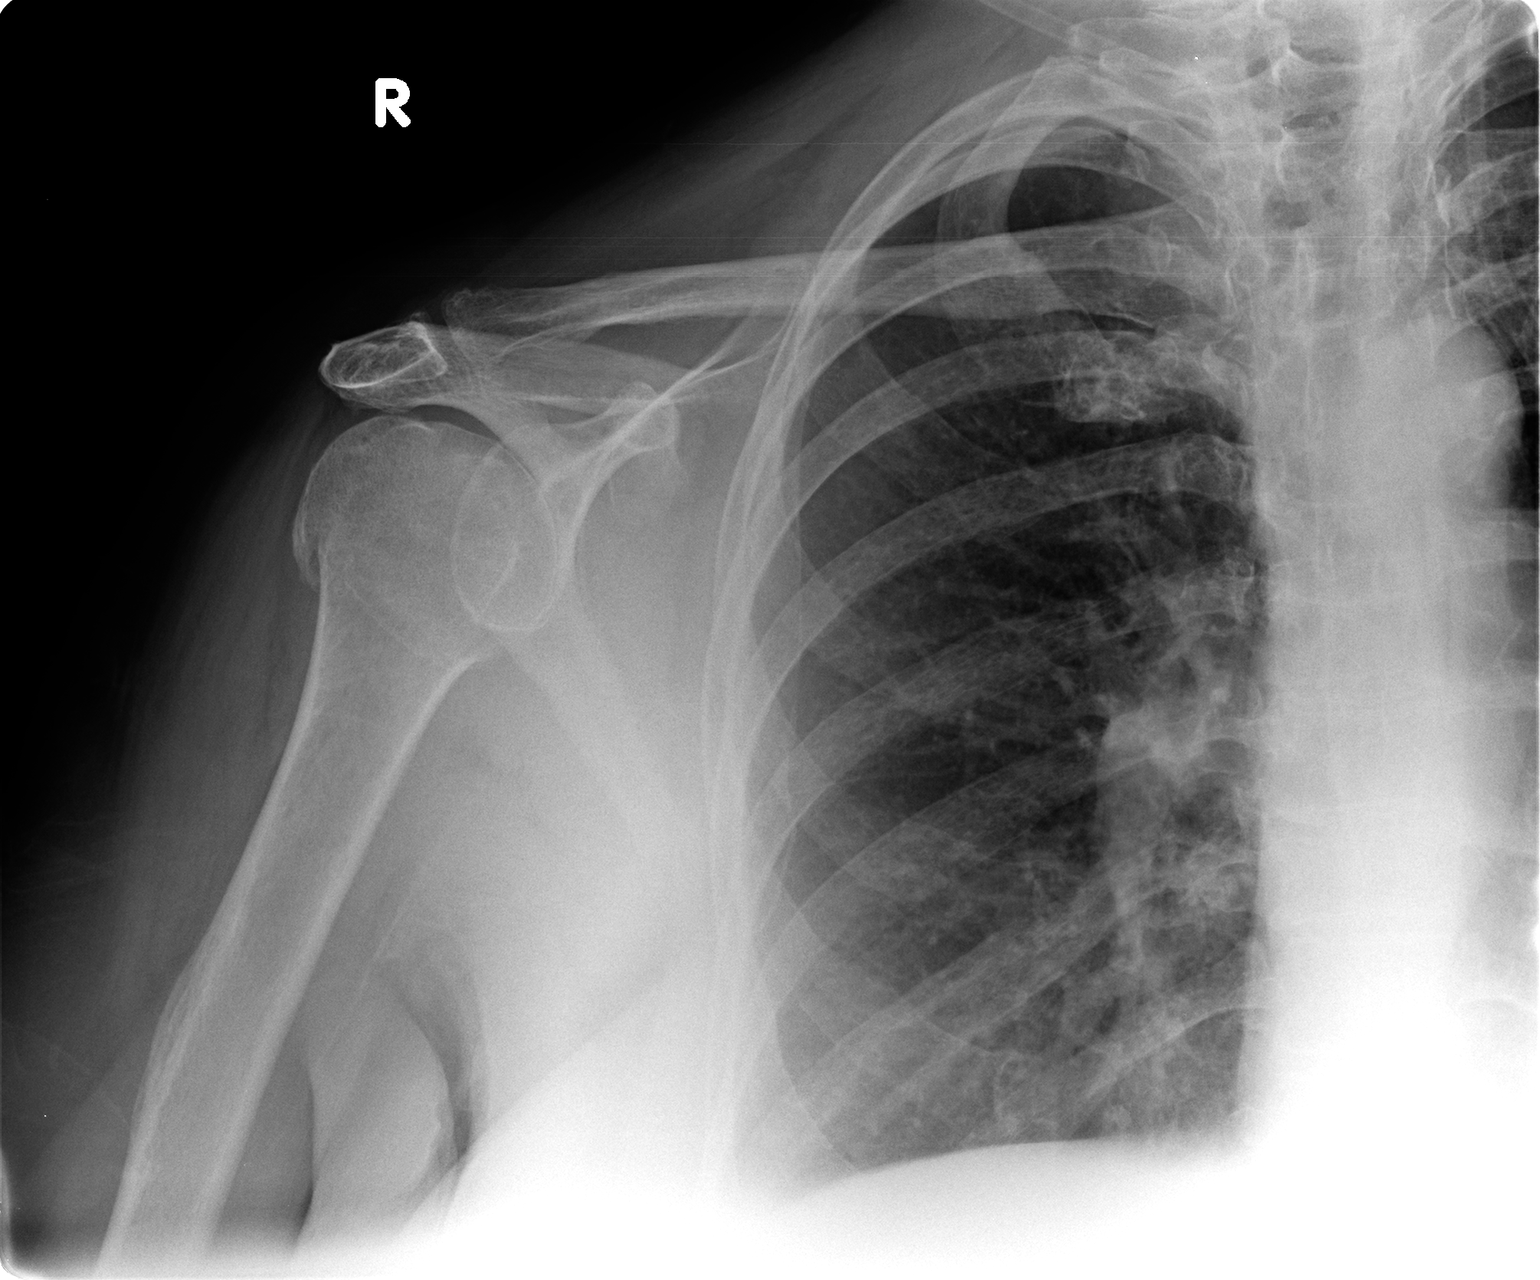

[view not recorded (3 of 3)]
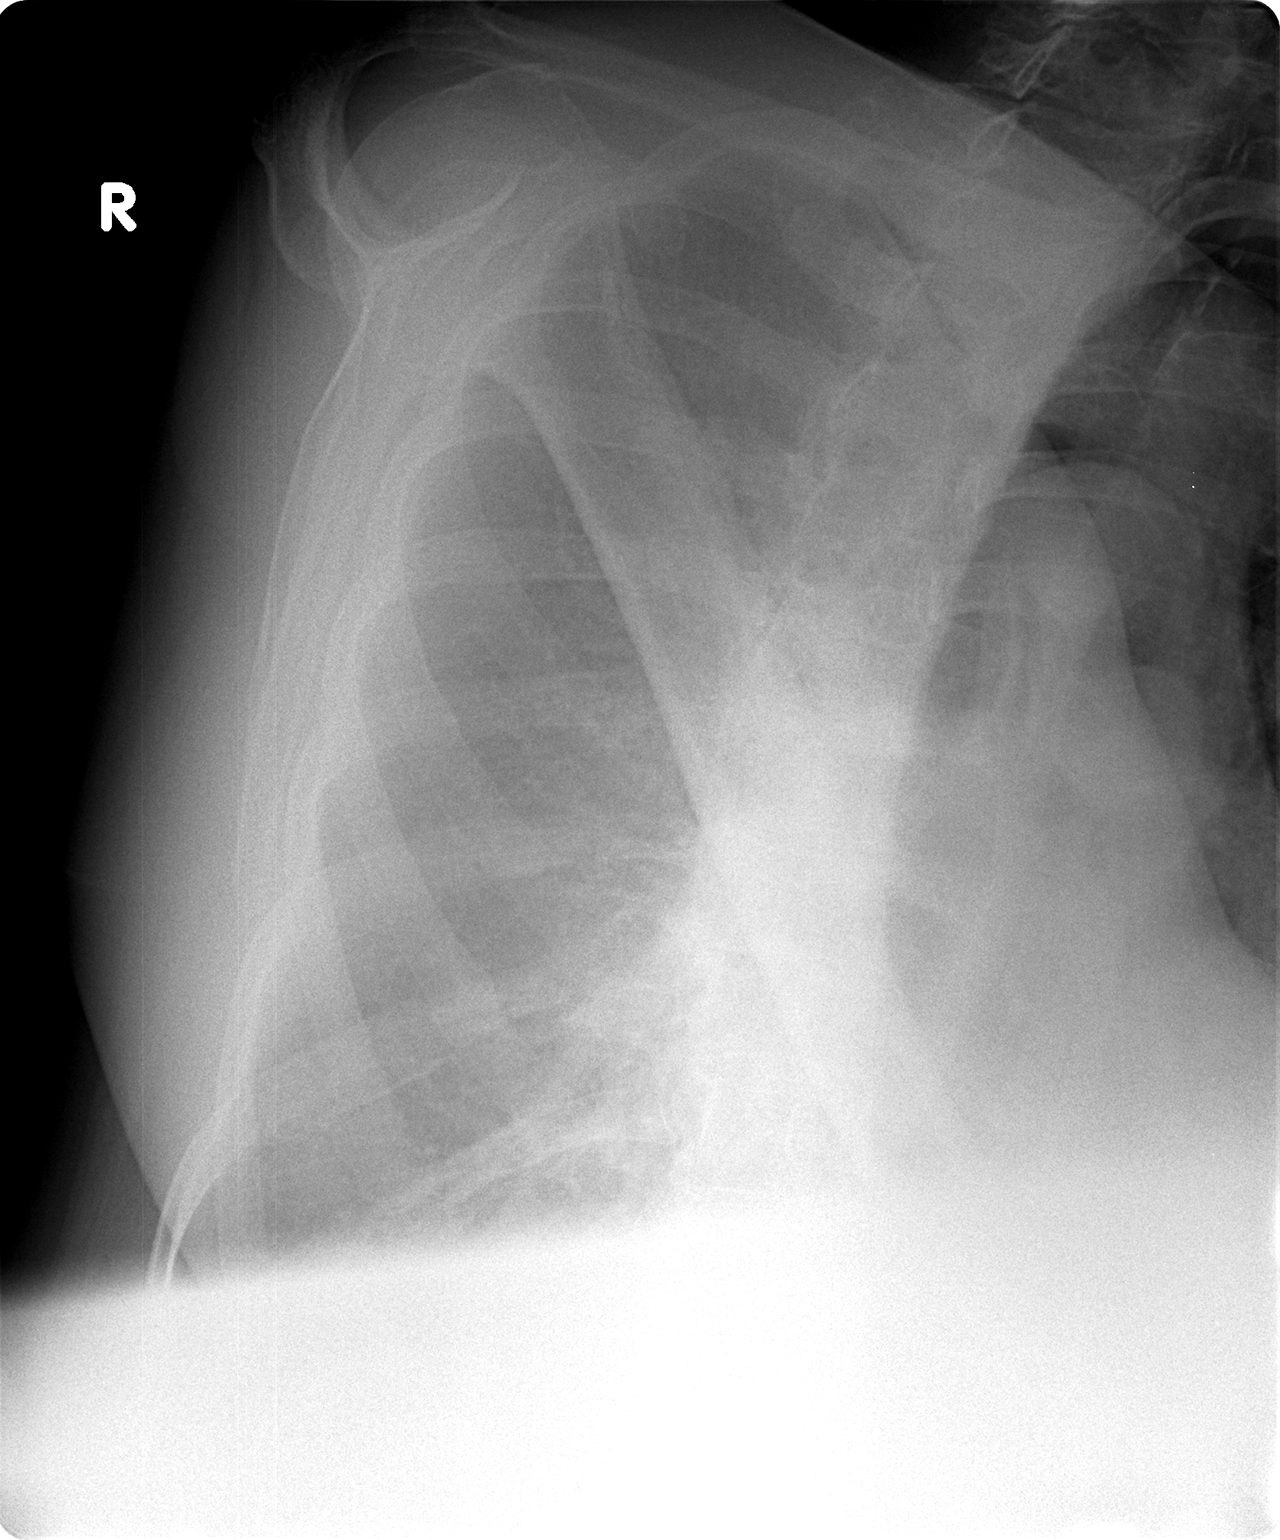

[3 of 3 positions shown; findings below may reference images not displayed]

FINDINGS: Transverse fracture of the surgical neck of the humerus,
displaced medially 3-4 mm without significant distraction or
angulation.  No dislocation.  Small AC spurs.
IMPRESSION: 1.  Minimally displaced fracture, surgical neck of proximal right
humerus

## 2008-07-12 IMAGING — CR DG ELBOW COMPLETE 3+V*R*
4 series · 4 of 4 positions shown · non-contrast
Comparison: 07/24/2006

CLINICAL DATA: Pain post fall

RIGHT ELBOW - COMPLETE 3+ VIEW

[x elbow joint ap right]
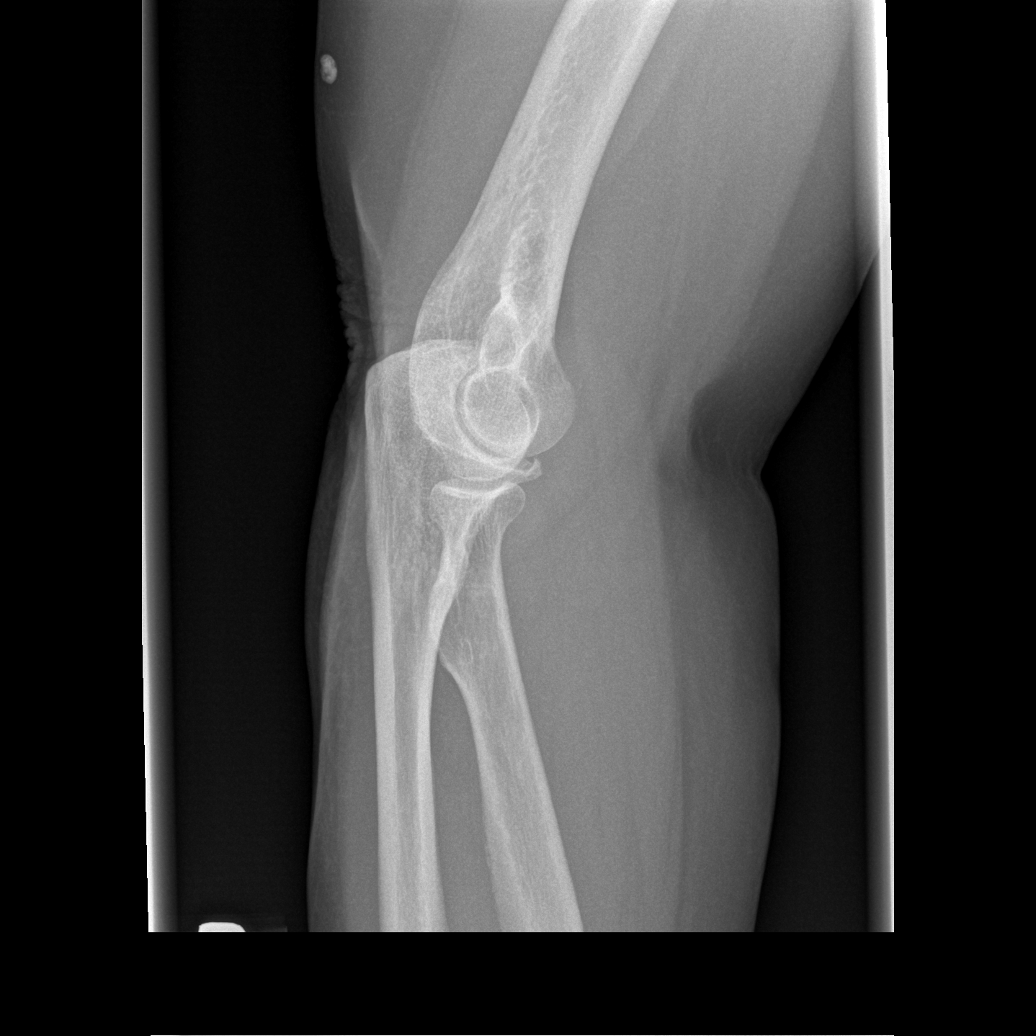

[x elbow joint obl. right (1 of 2)]
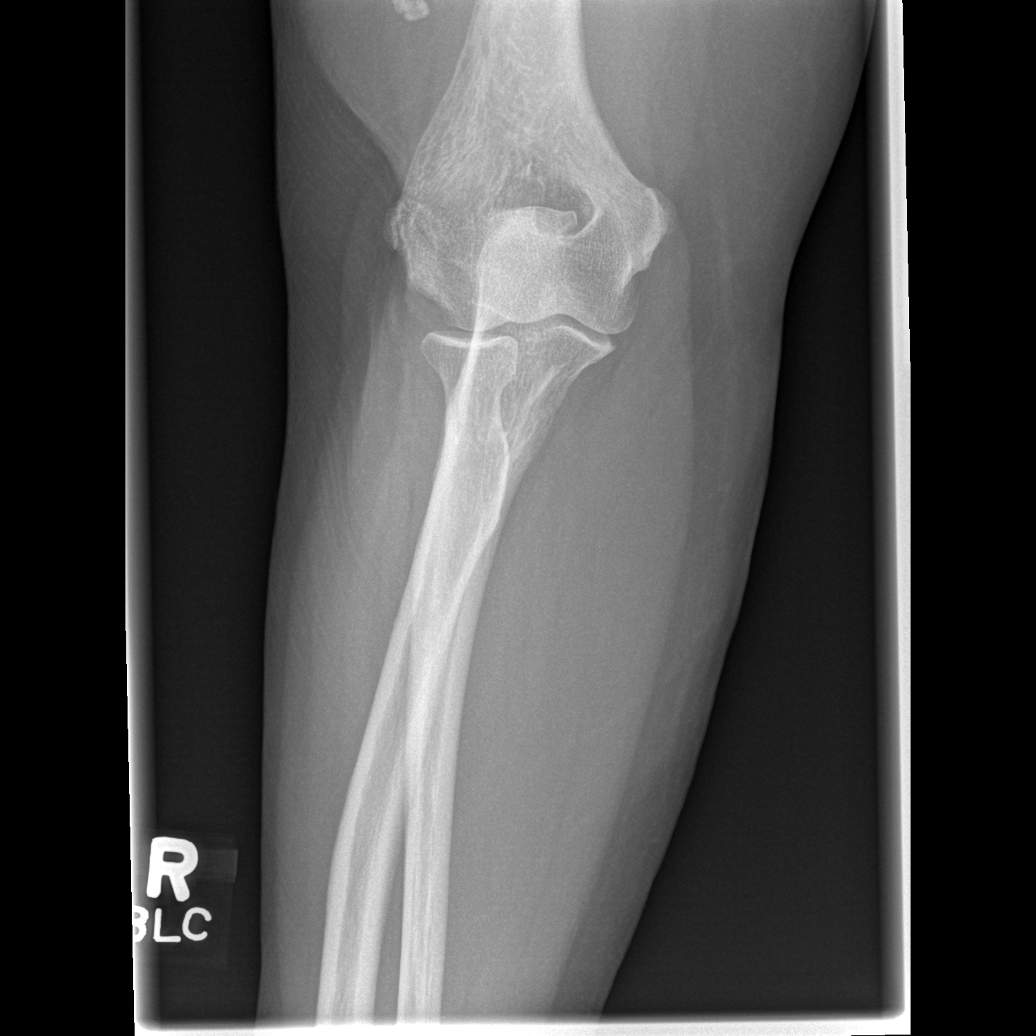

[x elbow joint obl. right (2 of 2)]
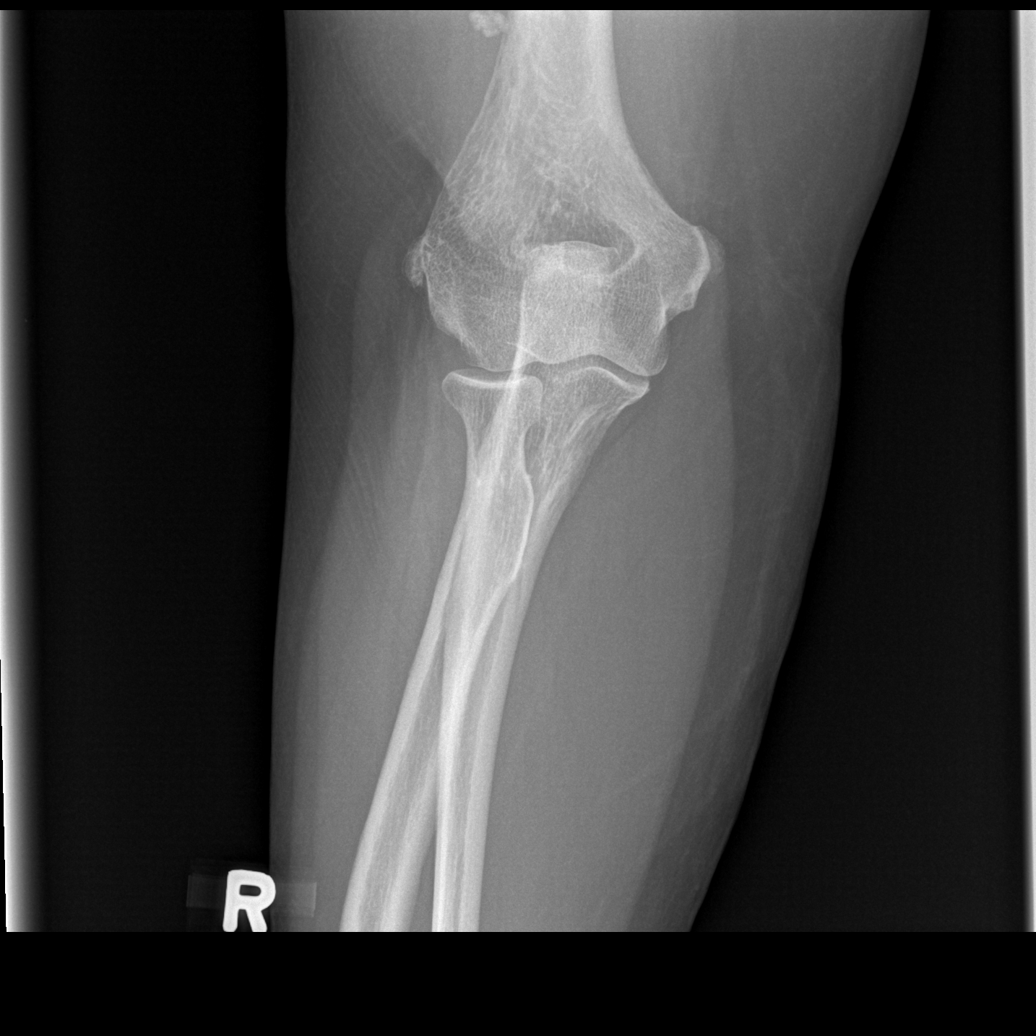

[x elbow joint lat right]
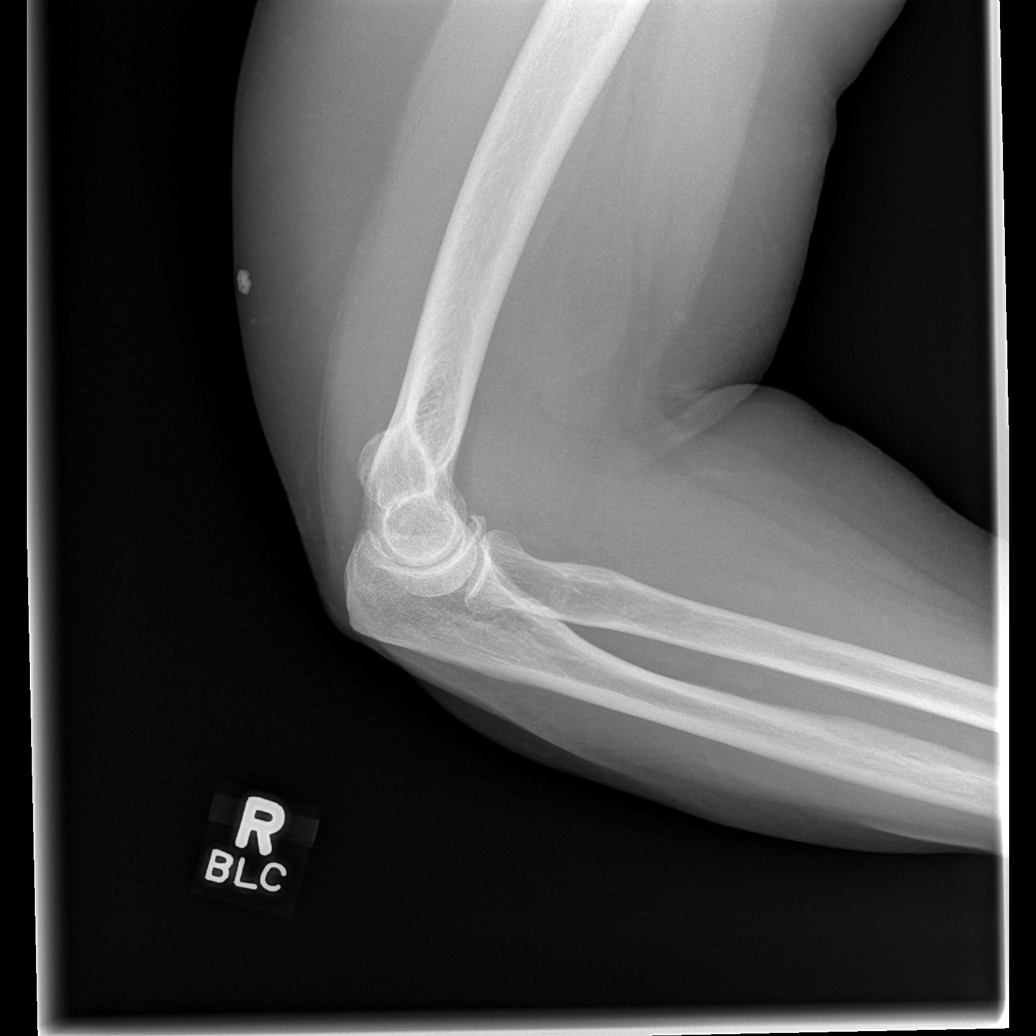

[4 of 4 positions shown; findings below may reference images not displayed]

FINDINGS: No effusion.  Subcutaneous foreign body posterior to the
distal humeral shaft was present on the previous study.  Negative
for fracture, dislocation, or other acute bone injury.  Small spur
from the coronoid process stable.
IMPRESSION: 1. Negative for fracture, dislocation, or other acute abnormality.
2.  Old subcutaneous foreign body.

## 2008-07-15 ENCOUNTER — Encounter: Payer: Self-pay | Admitting: Family Medicine

## 2008-07-23 ENCOUNTER — Encounter: Payer: Self-pay | Admitting: Family Medicine

## 2008-07-26 ENCOUNTER — Telehealth: Payer: Self-pay | Admitting: *Deleted

## 2008-07-29 ENCOUNTER — Ambulatory Visit: Payer: Self-pay | Admitting: Family Medicine

## 2008-07-29 ENCOUNTER — Telehealth: Payer: Self-pay | Admitting: Family Medicine

## 2008-08-01 ENCOUNTER — Encounter: Payer: Self-pay | Admitting: Family Medicine

## 2008-08-12 ENCOUNTER — Ambulatory Visit: Payer: Self-pay | Admitting: Sports Medicine

## 2008-08-12 ENCOUNTER — Encounter: Payer: Self-pay | Admitting: Family Medicine

## 2008-08-12 LAB — CONVERTED CEMR LAB
HCT: 39.7 % (ref 36.0–46.0)
Hemoglobin: 11.6 g/dL — ABNORMAL LOW (ref 12.0–15.0)
Hgb A1c MFr Bld: 5.7 %
MCHC: 29.2 g/dL — ABNORMAL LOW (ref 30.0–36.0)
RBC: 3.95 M/uL (ref 3.87–5.11)

## 2008-08-13 ENCOUNTER — Encounter: Payer: Self-pay | Admitting: Family Medicine

## 2008-08-21 ENCOUNTER — Encounter: Payer: Self-pay | Admitting: Family Medicine

## 2008-08-23 ENCOUNTER — Encounter: Payer: Self-pay | Admitting: Family Medicine

## 2008-08-27 ENCOUNTER — Telehealth (INDEPENDENT_AMBULATORY_CARE_PROVIDER_SITE_OTHER): Payer: Self-pay | Admitting: *Deleted

## 2008-09-02 ENCOUNTER — Telehealth (INDEPENDENT_AMBULATORY_CARE_PROVIDER_SITE_OTHER): Payer: Self-pay | Admitting: *Deleted

## 2008-09-11 ENCOUNTER — Telehealth: Payer: Self-pay | Admitting: *Deleted

## 2008-09-12 ENCOUNTER — Encounter: Payer: Self-pay | Admitting: Family Medicine

## 2008-09-12 ENCOUNTER — Telehealth: Payer: Self-pay | Admitting: *Deleted

## 2008-09-16 ENCOUNTER — Ambulatory Visit: Payer: Self-pay | Admitting: Family Medicine

## 2008-11-19 ENCOUNTER — Telehealth: Payer: Self-pay | Admitting: Family Medicine

## 2008-11-21 ENCOUNTER — Encounter: Payer: Self-pay | Admitting: Family Medicine

## 2008-12-18 ENCOUNTER — Encounter: Payer: Self-pay | Admitting: Family Medicine

## 2009-01-27 ENCOUNTER — Ambulatory Visit: Payer: Self-pay | Admitting: Family Medicine

## 2009-03-03 ENCOUNTER — Ambulatory Visit: Payer: Self-pay | Admitting: Family Medicine

## 2009-03-03 ENCOUNTER — Encounter: Payer: Self-pay | Admitting: Family Medicine

## 2009-03-04 ENCOUNTER — Encounter: Payer: Self-pay | Admitting: *Deleted

## 2009-03-19 ENCOUNTER — Emergency Department (HOSPITAL_COMMUNITY): Admission: EM | Admit: 2009-03-19 | Discharge: 2009-03-19 | Payer: Self-pay | Admitting: Emergency Medicine

## 2009-03-20 ENCOUNTER — Telehealth: Payer: Self-pay | Admitting: *Deleted

## 2009-03-20 ENCOUNTER — Emergency Department (HOSPITAL_COMMUNITY): Admission: EM | Admit: 2009-03-20 | Discharge: 2009-03-20 | Payer: Self-pay | Admitting: Emergency Medicine

## 2009-03-23 ENCOUNTER — Emergency Department (HOSPITAL_COMMUNITY): Admission: EM | Admit: 2009-03-23 | Discharge: 2009-03-23 | Payer: Self-pay | Admitting: Emergency Medicine

## 2009-03-28 ENCOUNTER — Telehealth: Payer: Self-pay | Admitting: Family Medicine

## 2009-03-30 ENCOUNTER — Emergency Department (HOSPITAL_COMMUNITY): Admission: EM | Admit: 2009-03-30 | Discharge: 2009-03-30 | Payer: Self-pay | Admitting: Emergency Medicine

## 2009-04-10 ENCOUNTER — Encounter: Payer: Self-pay | Admitting: Family Medicine

## 2009-04-10 ENCOUNTER — Ambulatory Visit: Payer: Self-pay | Admitting: Family Medicine

## 2009-04-10 DIAGNOSIS — Z993 Dependence on wheelchair: Secondary | ICD-10-CM

## 2009-04-16 ENCOUNTER — Telehealth: Payer: Self-pay | Admitting: *Deleted

## 2009-04-17 ENCOUNTER — Emergency Department (HOSPITAL_COMMUNITY): Admission: EM | Admit: 2009-04-17 | Discharge: 2009-04-17 | Payer: Self-pay | Admitting: Emergency Medicine

## 2009-04-17 ENCOUNTER — Telehealth: Payer: Self-pay | Admitting: Family Medicine

## 2009-04-18 ENCOUNTER — Encounter: Payer: Self-pay | Admitting: Family Medicine

## 2009-04-21 IMAGING — CR DG CHEST 2V
2 series · 2 of 2 positions shown · non-contrast
Comparison: 03/09/2007.

CLINICAL DATA: Cough and cold symptoms.  Congestion and fever.
Hypertension and diabetes.  Chronic airway obstruction.

CHEST - 2 VIEW

[w chest lat]
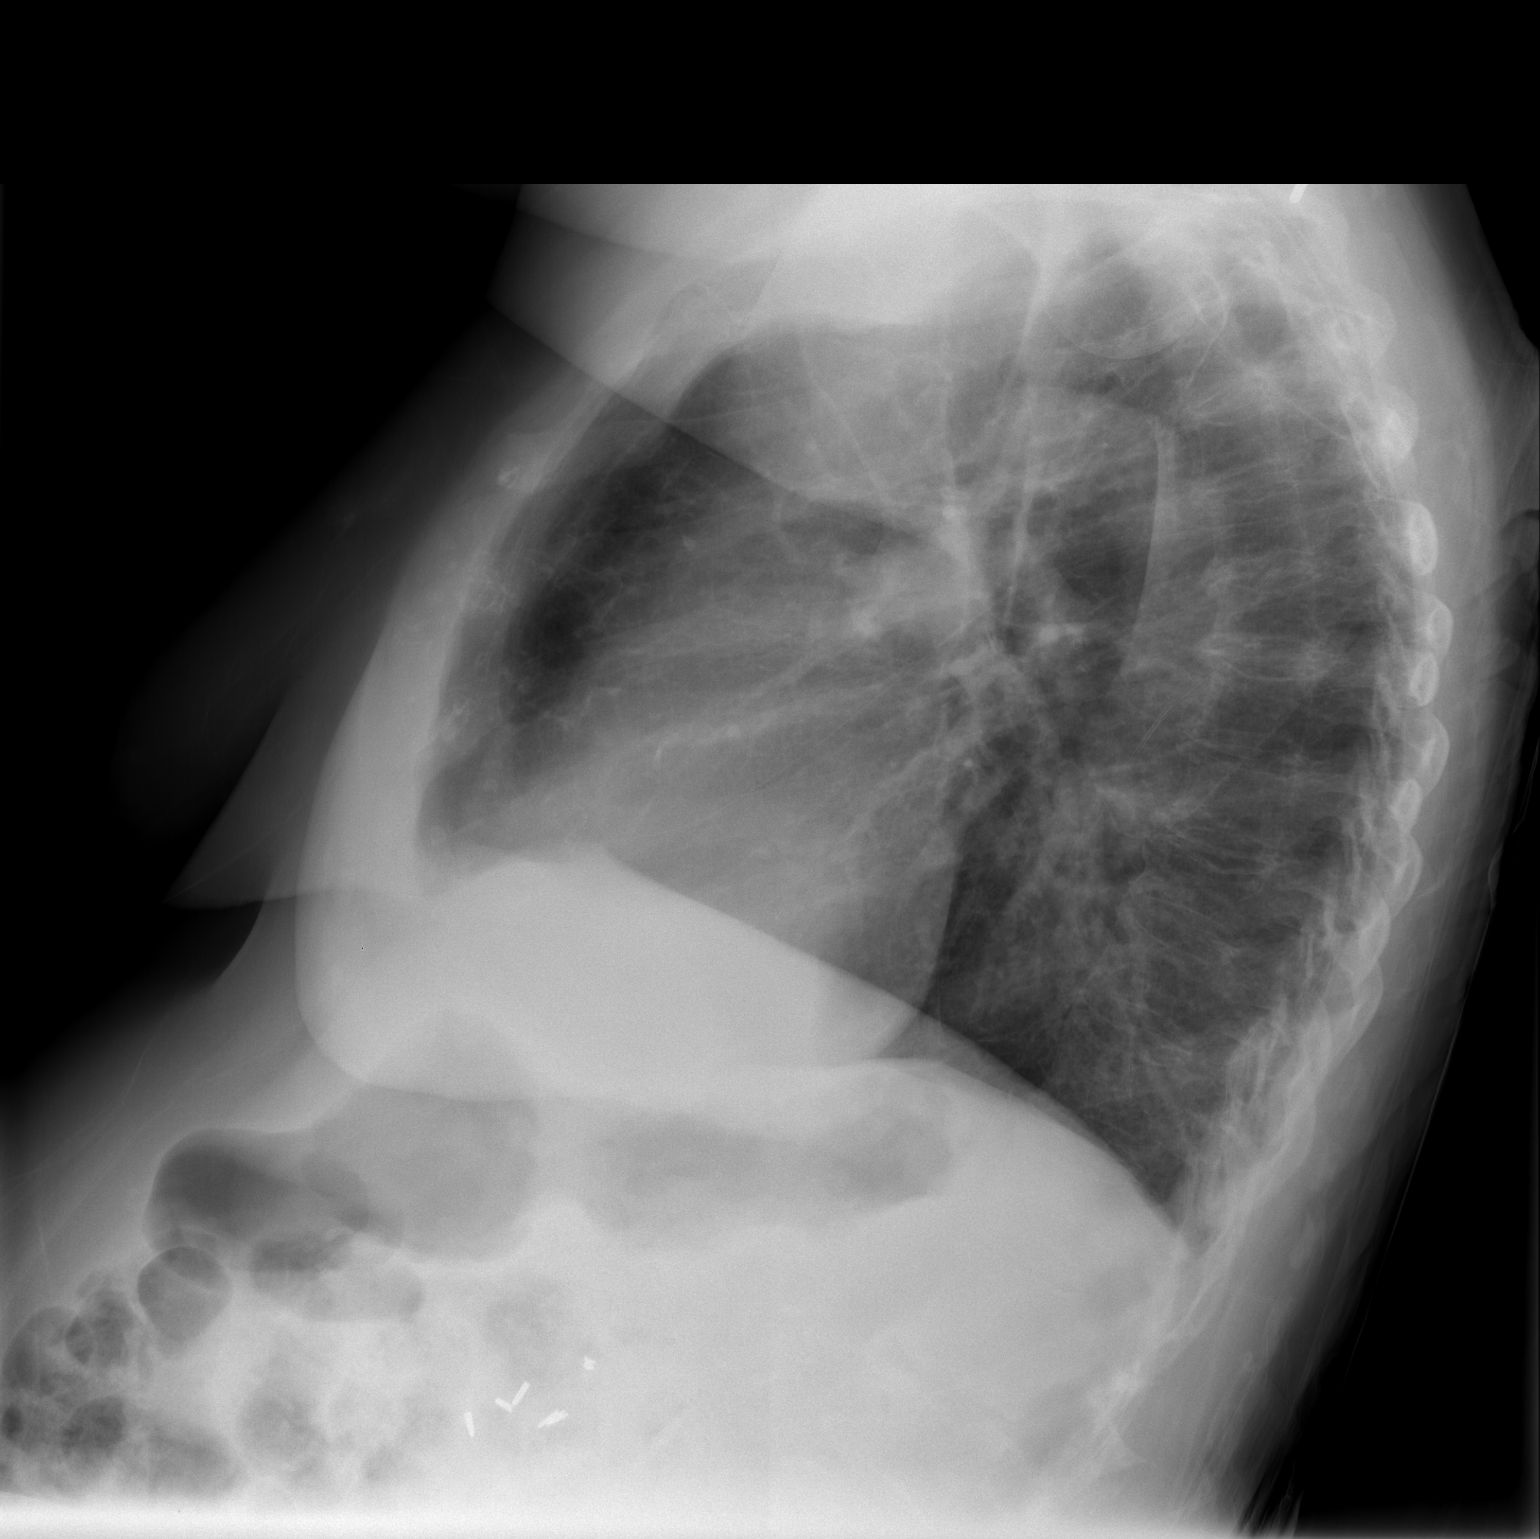

[view not recorded]
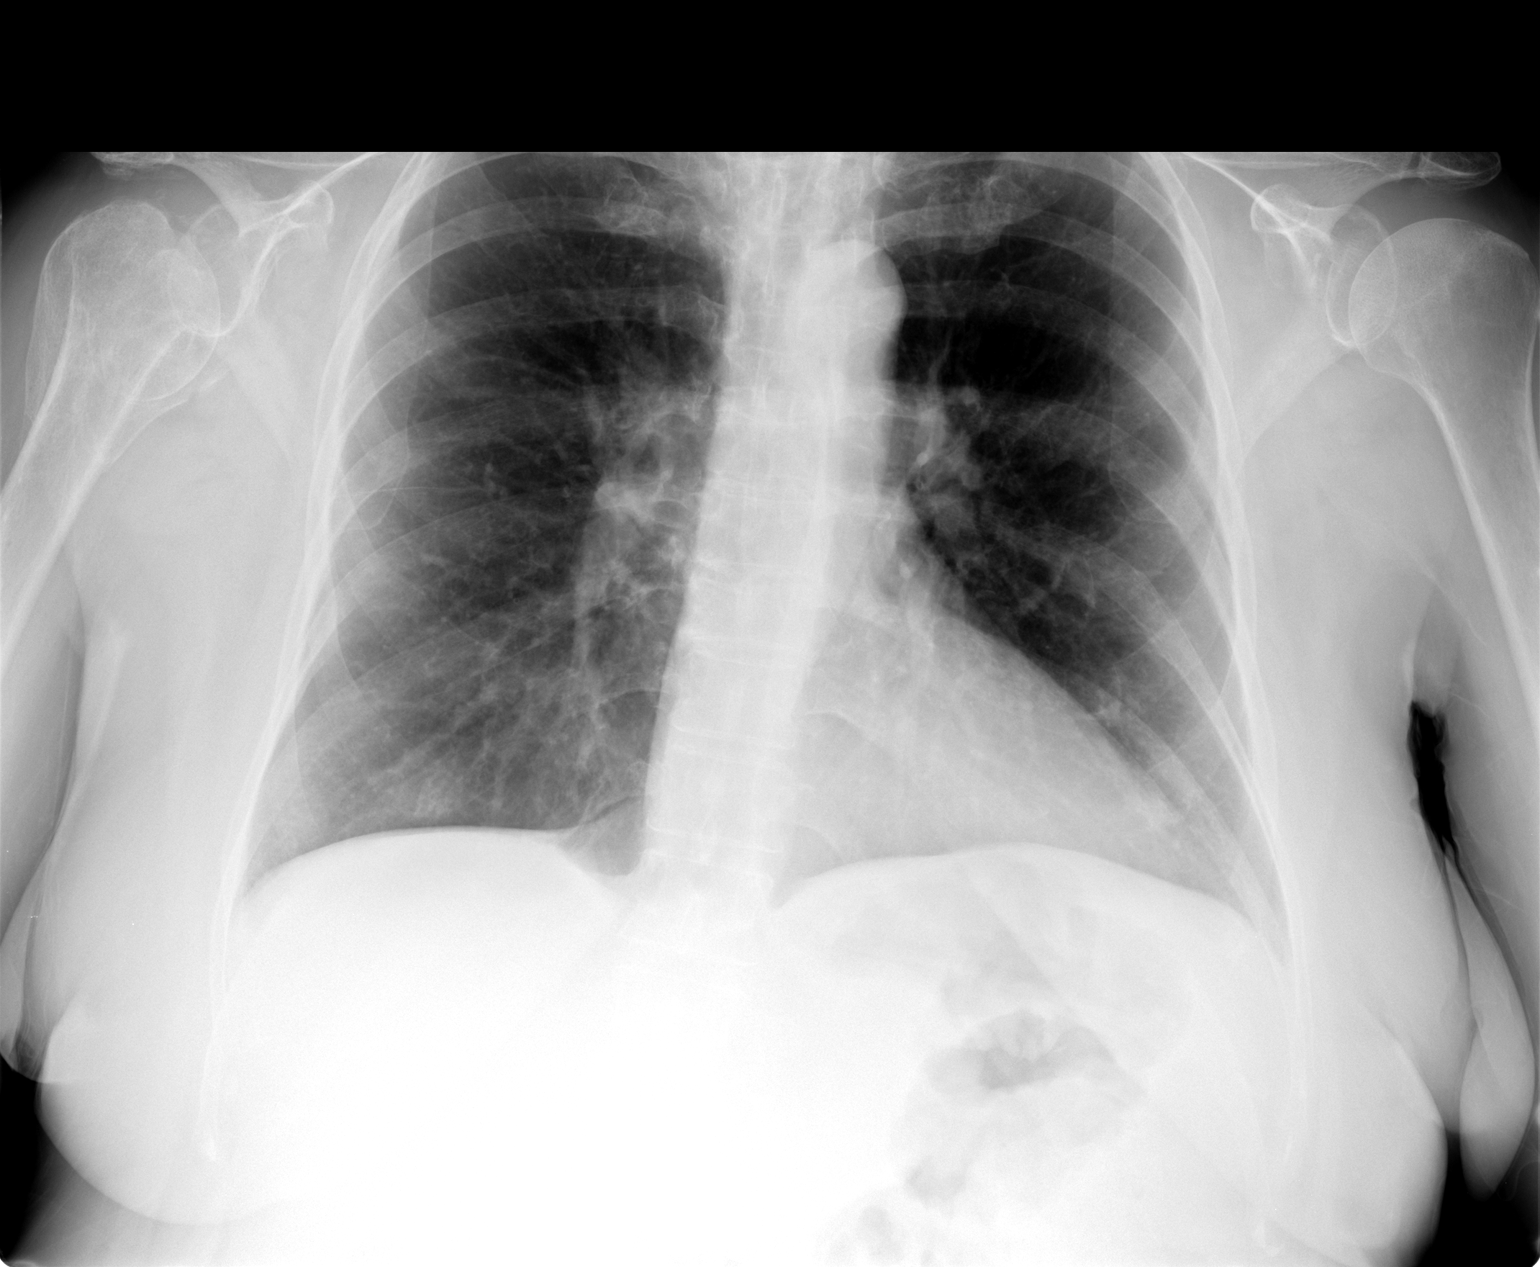

[2 of 2 positions shown; findings below may reference images not displayed]

FINDINGS: The heart is mildly enlarged.  The lung fields are
clear.  There may be mild hyperinflation.  Trachea midline.  Mild
aortic calcification.  Bones unremarkable.  Improved aeration
compared with priors with clearing right basilar atelectasis.
IMPRESSION: No acute infiltrates.  Mild hyperinflation and mild cardiomegaly.

## 2009-04-22 IMAGING — CT CT MAXILLOFACIAL W/O CM
2 of 4 series · 15 of 30 positions shown, 18 images · non-contrast
Comparison: CTs of the head and cervical spine 01/25/2008.

CT HEAD

CLINICAL DATA: Fall today with resulting headache, nasal pain,
epistaxis and neck pain.

CT HEAD WITHOUT CONTRAST
CT MAXILLOFACIAL WITHOUT CONTRAST
CT CERVICAL SPINE WITHOUT CONTRAST
TECHNIQUE: Multidetector CT imaging of the head, cervical spine,
and maxillofacial structures were performed using the standard
protocol without intravenous contrast. Multiplanar CT image
reconstructions of the cervical spine and maxillofacial structures
were also generated.

[Series 7: c_spine 2.0 b31s · axial · 0.31mm/px · z∈[+1003,+1123]mm · 10 of 74 slices shown, 13 images]
[im 7/74  brain]
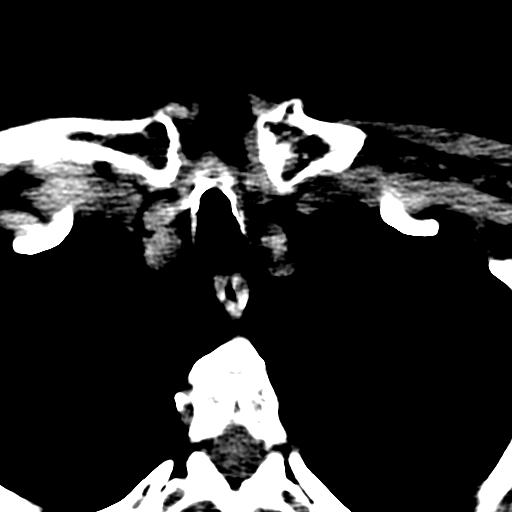
[im 7/74  bone]
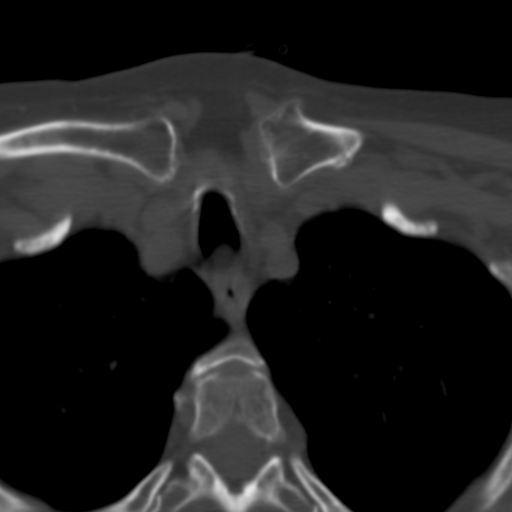
[im 14/74  bone]
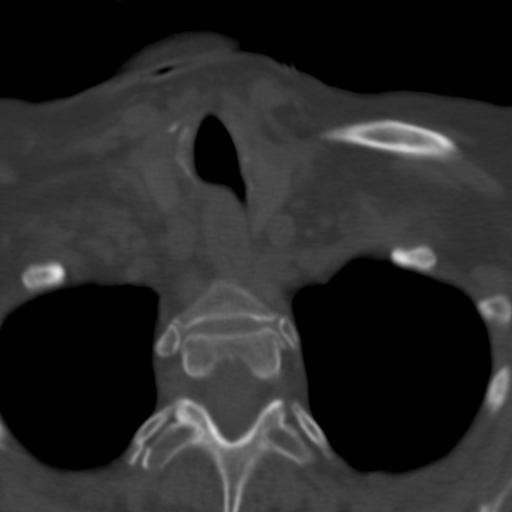
[im 20/74  bone]
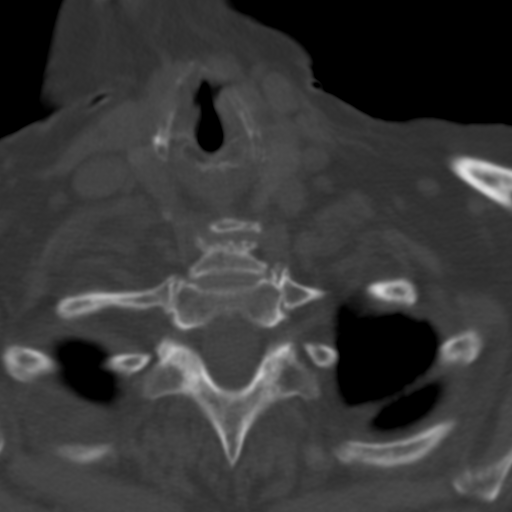
[im 27/74  bone]
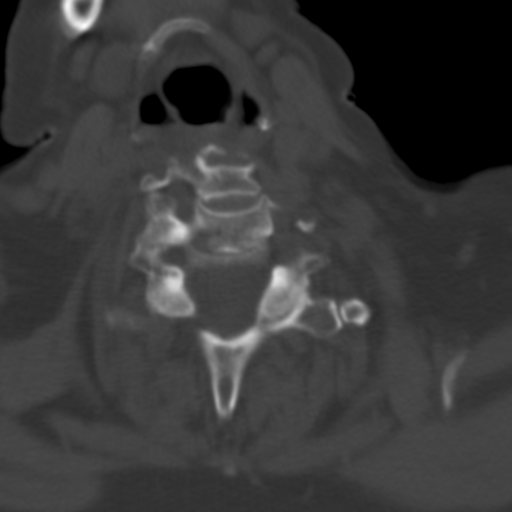
[im 34/74  brain]
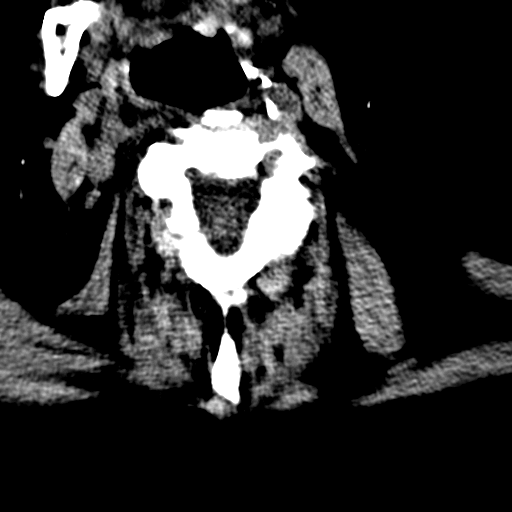
[im 34/74  bone]
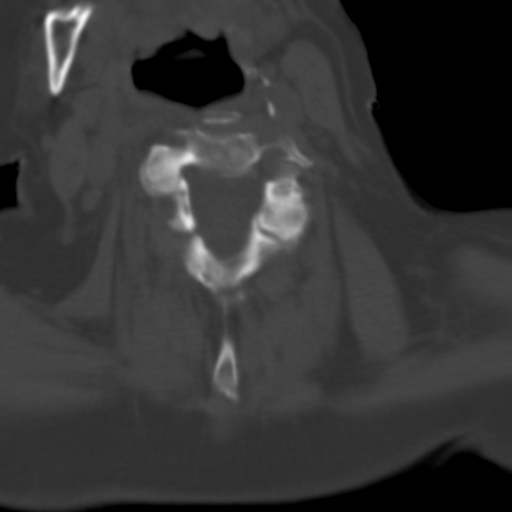
[im 40/74  bone]
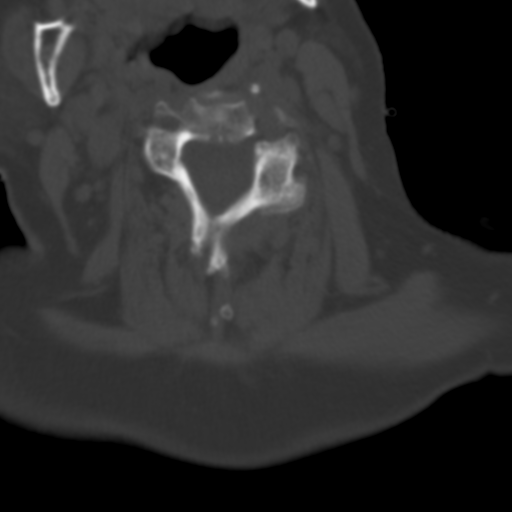
[im 47/74  bone]
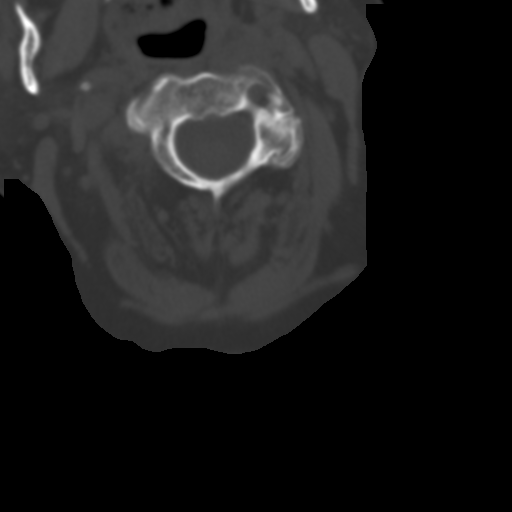
[im 54/74  bone]
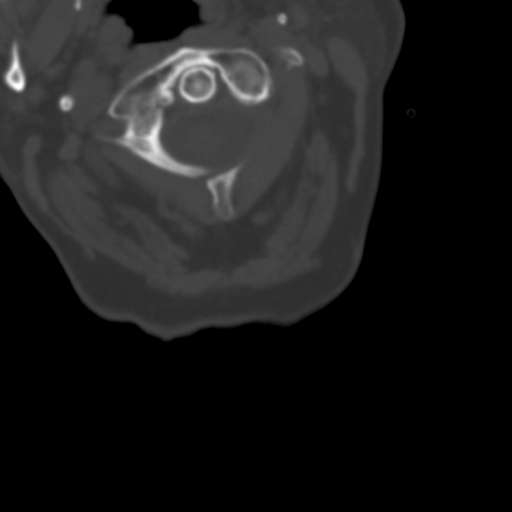
[im 60/74  brain]
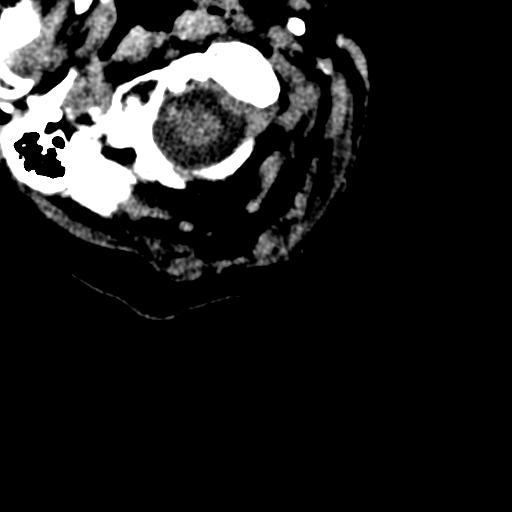
[im 60/74  bone]
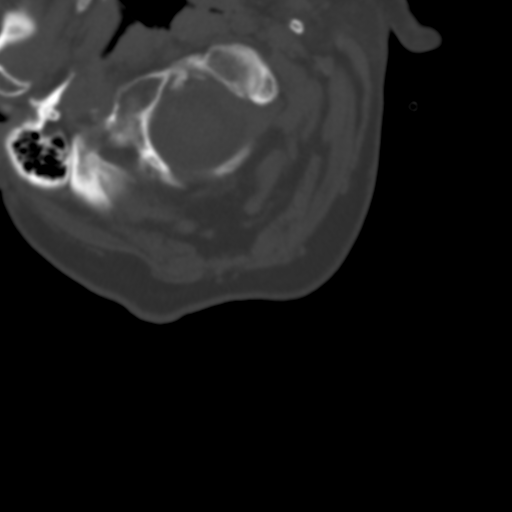
[im 67/74  bone]
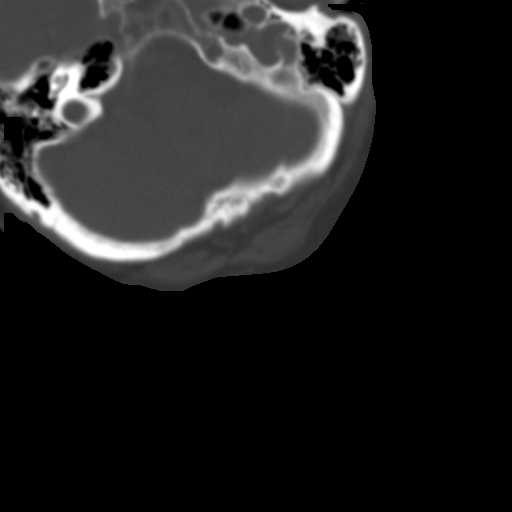

[Series 10: orbit 2.0 h32s · axial · 0.29mm/px · z∈[+1032,+1104]mm · 5 of 73 slices shown]
[im 8/73  bone]
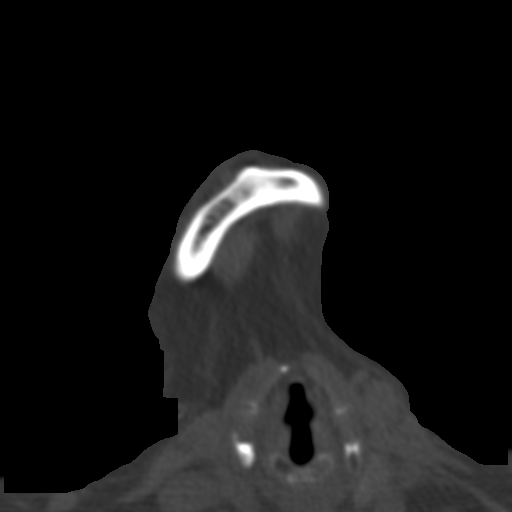
[im 15/73  bone]
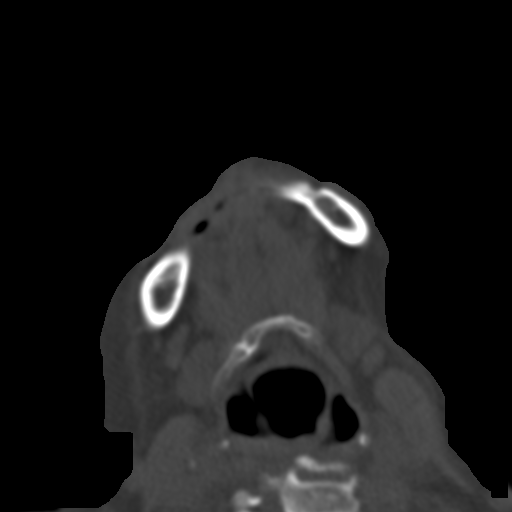
[im 22/73  bone]
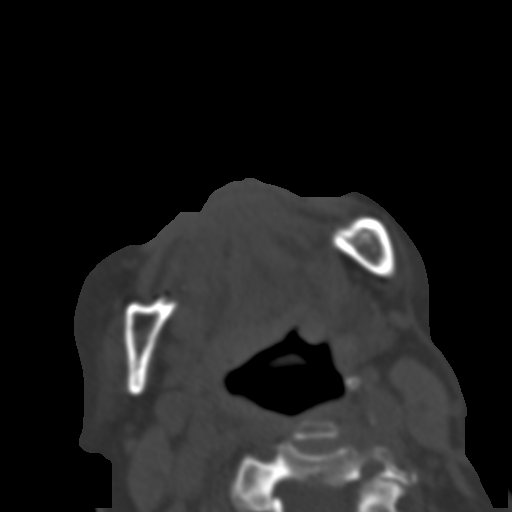
[im 29/73  bone]
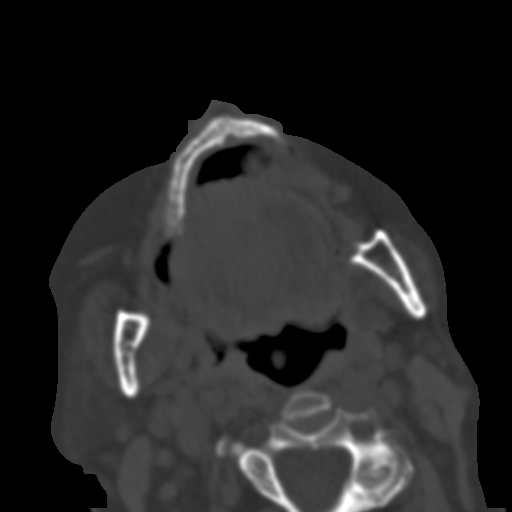
[im 44/73  bone]
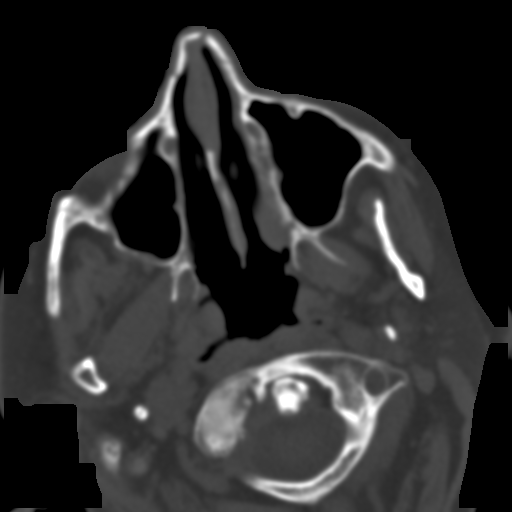

[15 of 30 positions shown; findings below may reference images not displayed]

FINDINGS: Some images were repeated due to motion. There is no
evidence of acute intracranial hemorrhage, mass lesion, brain edema
or extra-axial fluid collection.  The ventricles and subarachnoid
spaces are appropriately sized for age.  There is no CT evidence of
acute cortical infarction.  The calvarium is intact without
evidence of fracture.
IMPRESSION: No acute intracranial or calvarial findings.

CT MAXILLOFACIAL
FINDINGS: There is nasal bone irregularity and overlying soft
tissue swelling on the left on images 46 and 47 suspicious for an
acute fracture.  I note a similar irregularity of the left nasal
bone on the prior head CT however. No other facial fractures are
identified.  There is mucosal thickening in the maxillary and
ethmoid sinuses bilaterally which appears new.  There may be a few
scattered ethmoid sinus air-fluid levels.  The frontal and sphenoid
sinuses are clear.  The mastoids and middle ears are clear.  There
is no evidence of orbital hematoma or globe injury.
IMPRESSION: 1.  Possible acute nasal bone fracture on the left.  Correlate
clinically.
2.  No other facial fractures are identified.
3.  Paranasal sinus mucosal thickening and/or air fluid levels as
described.

CT CERVICAL SPINE
FINDINGS: As demonstrated on the prior examinations, there is
significant post-traumatic deformity of the lower cervical spine.
There is 5 mm of anterolisthesis of C6 on C7 with an old fracture
of the anterior superior corner of C7.  A right C5 laminar fracture
is best demonstrated on image 44 of series 603 and appears stable.
No definite acute fracture is identified.  There is diffuse
spondylosis and a mild scoliosis.  No high-grade spinal stenosis or
definite acute soft tissue abnormality is identified.
IMPRESSION: 1.  No evidence of acute fracture or gross change from prior
examination.
2.  There is post-traumatic deformity with an anterolisthesis of C6
on C7 due to an old fracture-subluxation.  Although no gross change
is seen from the prior examination, it would be difficult to
completely exclude some instability at this level.  Correlate
clinically.

## 2009-04-25 ENCOUNTER — Telehealth: Payer: Self-pay | Admitting: *Deleted

## 2009-04-25 IMAGING — CR DG CHEST 2V
2 series · 2 of 2 positions shown · non-contrast
Comparison: 03/19/2009.

CLINICAL DATA: Short of breath and cough.

CHEST - 2 VIEW

[w chest lat *]
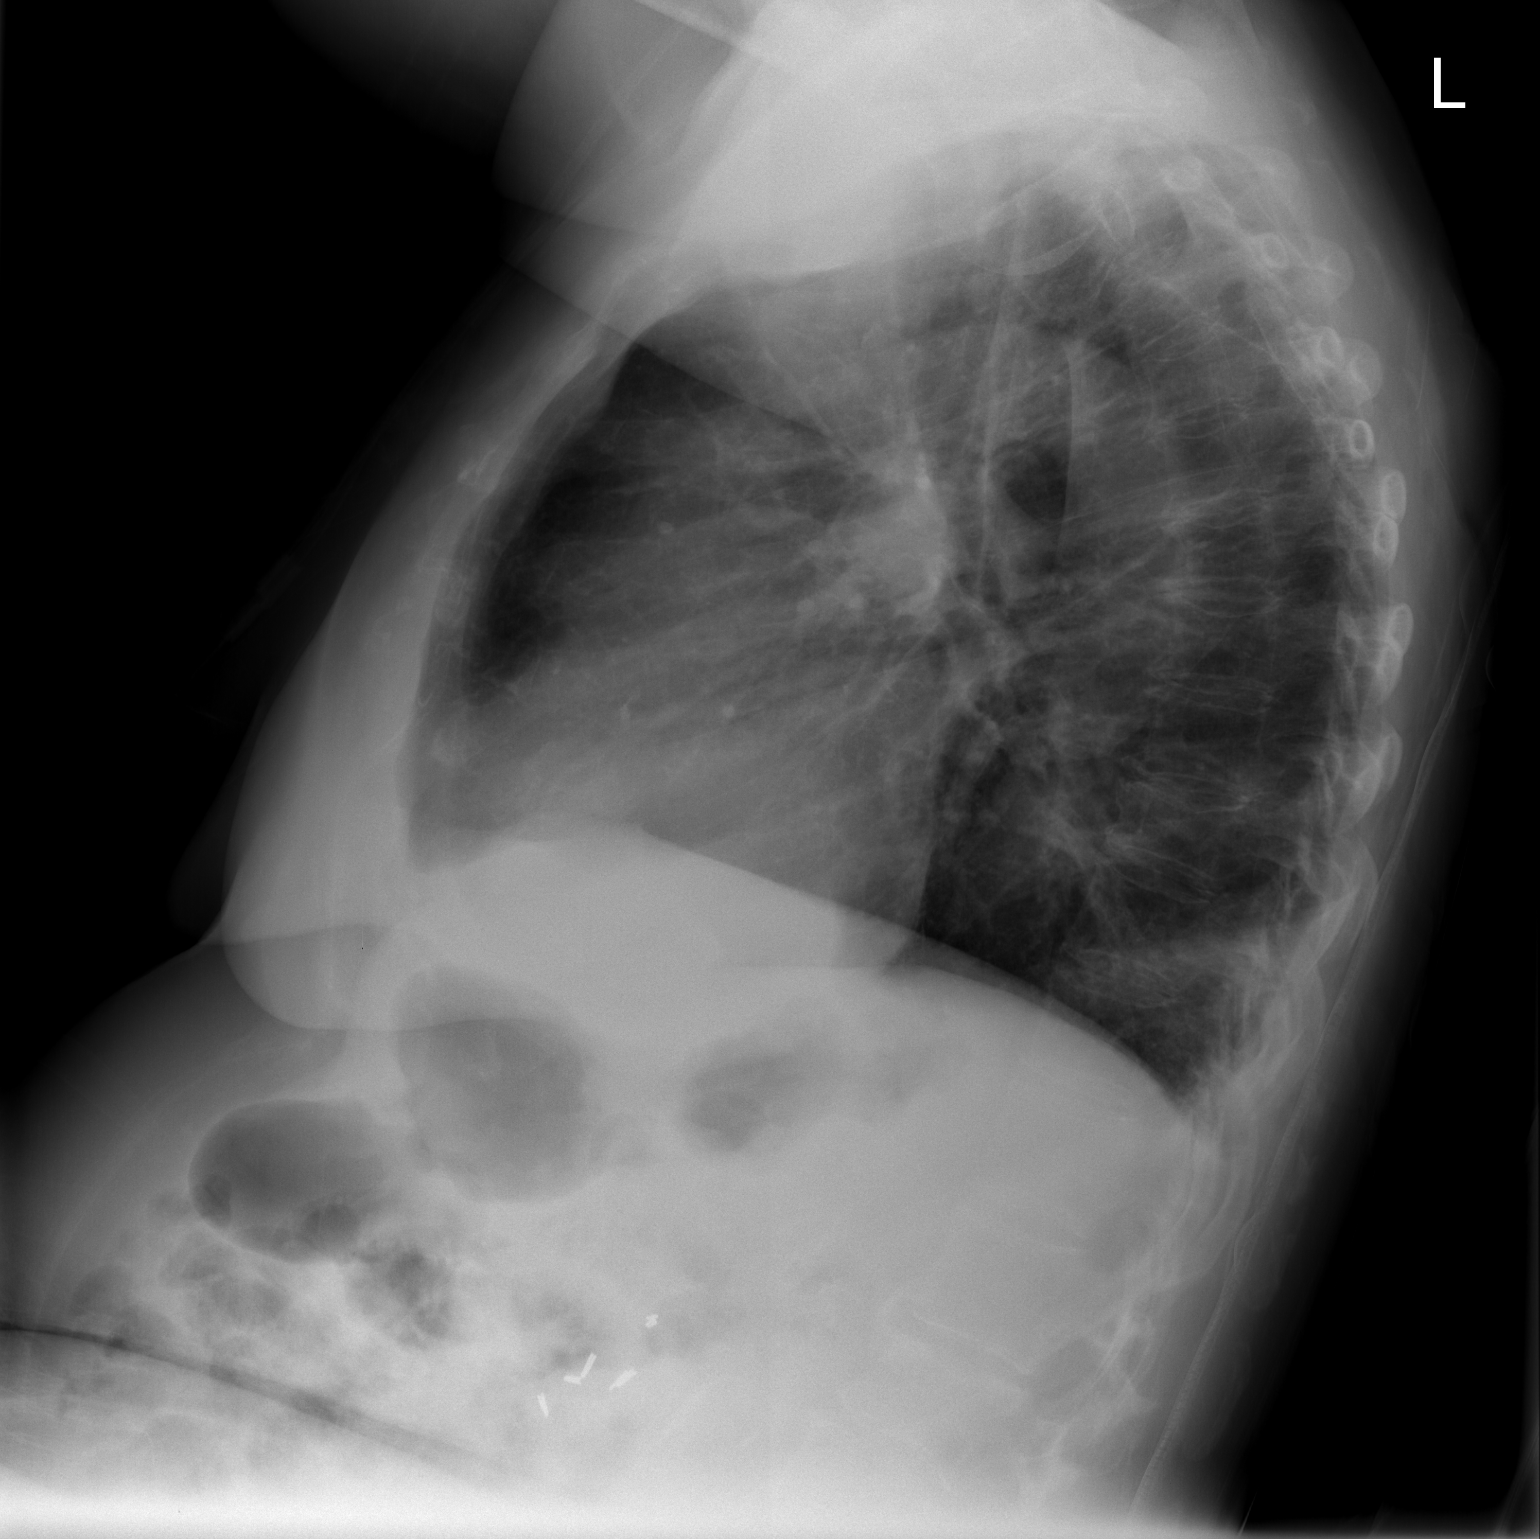

[view not recorded]
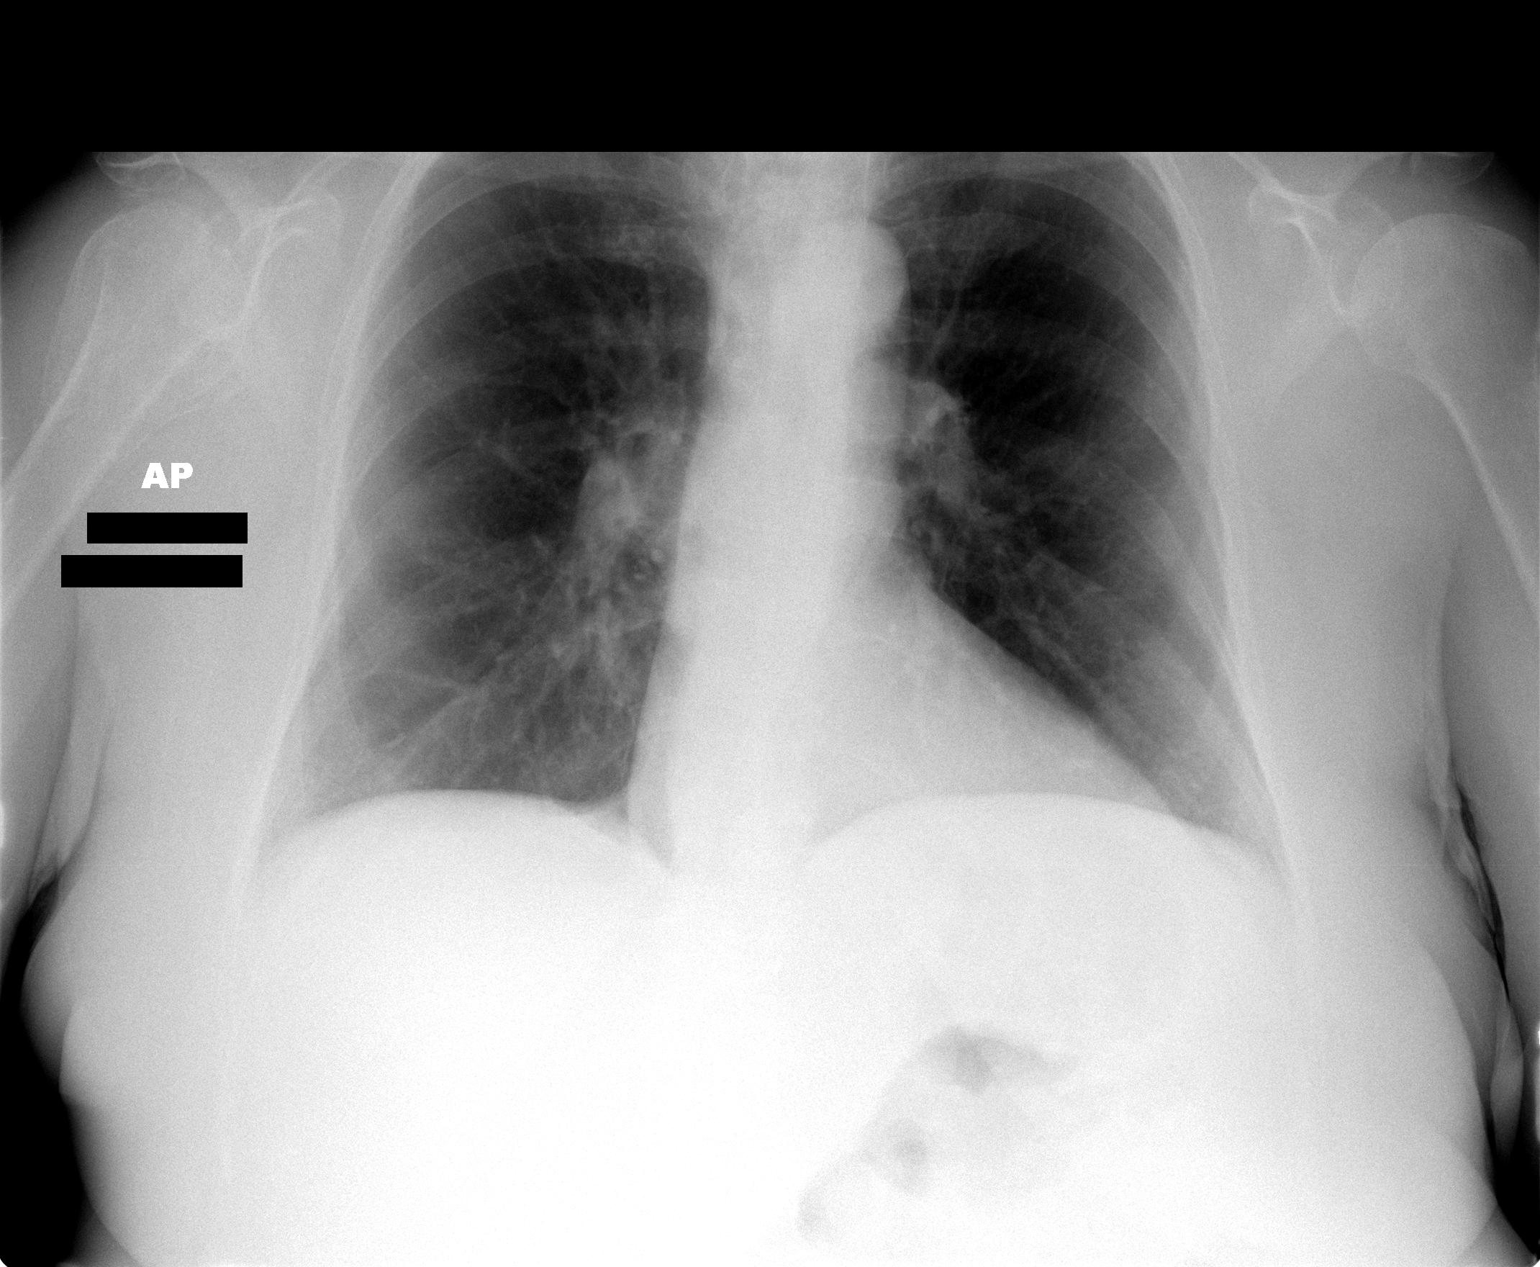

[2 of 2 positions shown; findings below may reference images not displayed]

FINDINGS: The lungs are clear.  Negative for infiltrate or
effusion.  There has been no change from prior study.
IMPRESSION: No active cardiopulmonary disease.

## 2009-04-28 ENCOUNTER — Encounter: Payer: Self-pay | Admitting: Family Medicine

## 2009-04-30 ENCOUNTER — Telehealth: Payer: Self-pay | Admitting: Family Medicine

## 2009-05-01 ENCOUNTER — Encounter: Payer: Self-pay | Admitting: Family Medicine

## 2009-05-02 ENCOUNTER — Telehealth: Payer: Self-pay | Admitting: Family Medicine

## 2009-05-02 IMAGING — CT CT HEAD W/O CM
1 series · 16 of 30 positions shown, 20 images · non-contrast
Comparison: 03/20/2009

CLINICAL DATA: Headache.  Hematoma left frontal region post fall
today.

CT HEAD WITHOUT CONTRAST
TECHNIQUE: Contiguous axial images were obtained from the base of
the skull through the vertex without contrast.

[Series 2: headseq 4.8 h45s · axial · 0.40mm/px · z∈[+1142,+1273]mm · 16 of 30 slices shown, 20 images]
[im 2/30  brain]
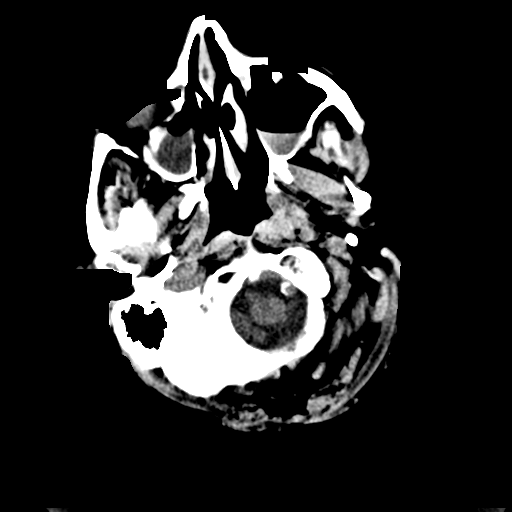
[im 2/30  bone]
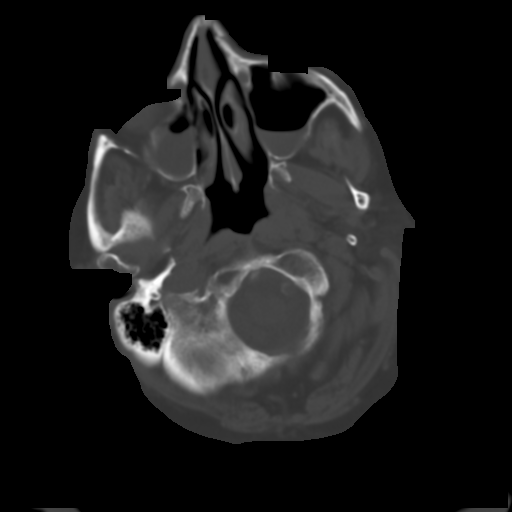
[im 4/30  brain]
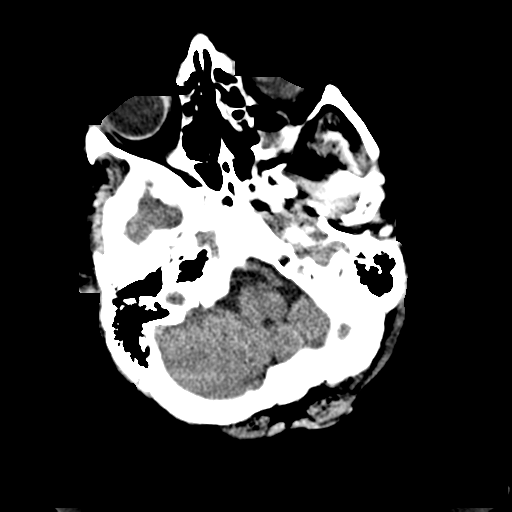
[im 6/30  brain]
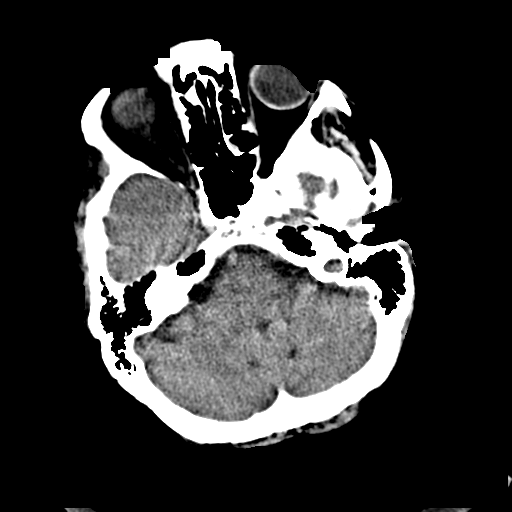
[im 8/30  brain]
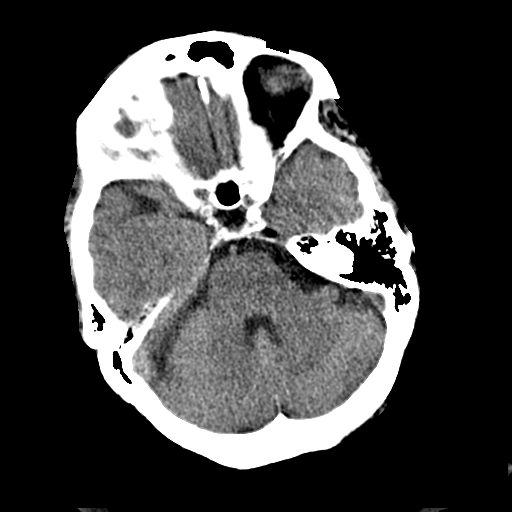
[im 9/30  brain]
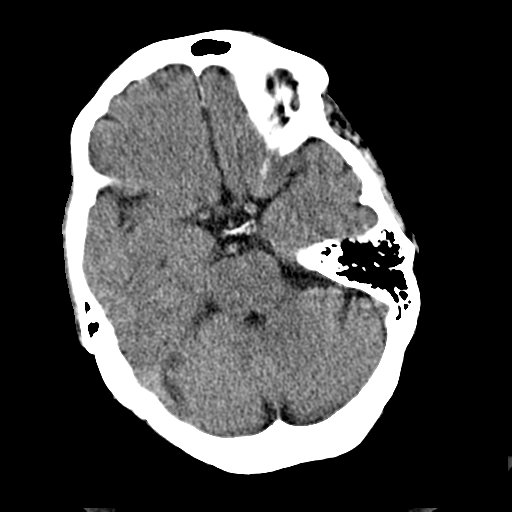
[im 9/30  bone]
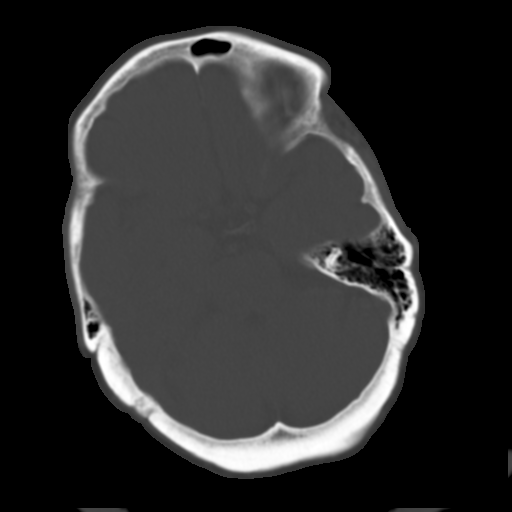
[im 11/30  brain]
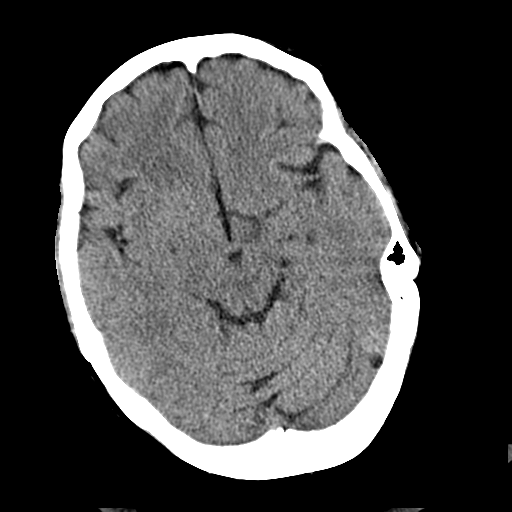
[im 13/30  brain]
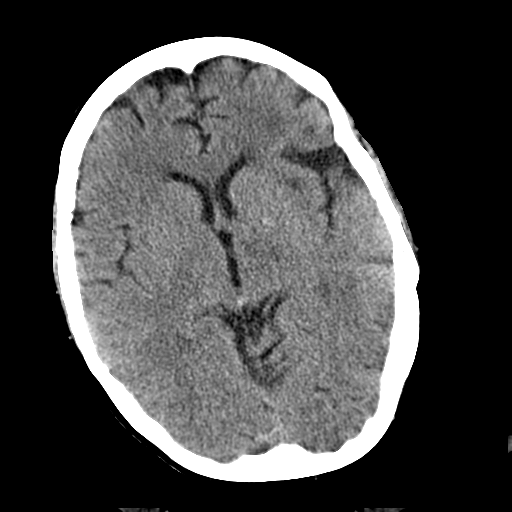
[im 15/30  brain]
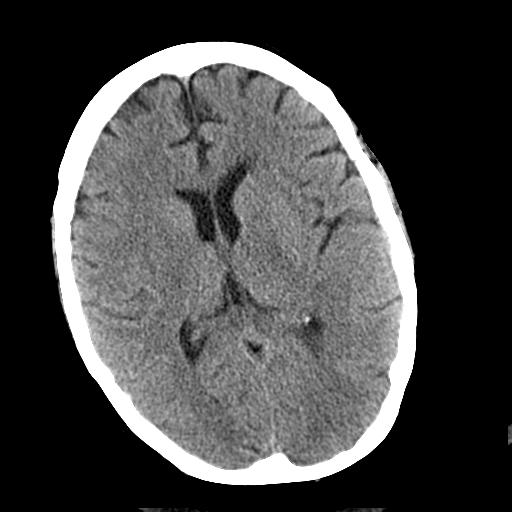
[im 16/30  brain]
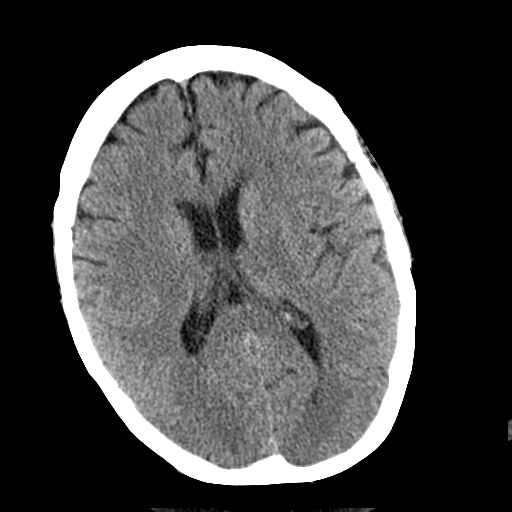
[im 16/30  bone]
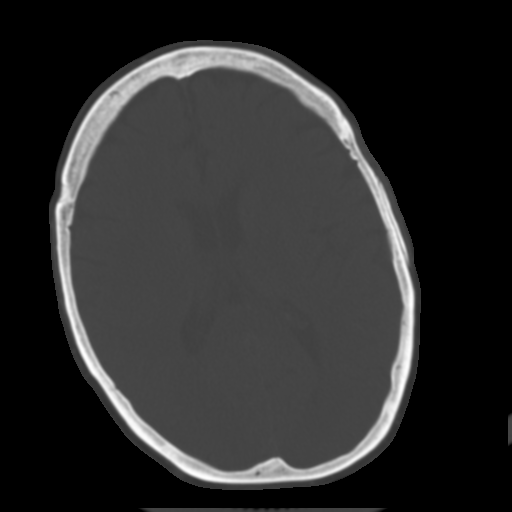
[im 18/30  brain]
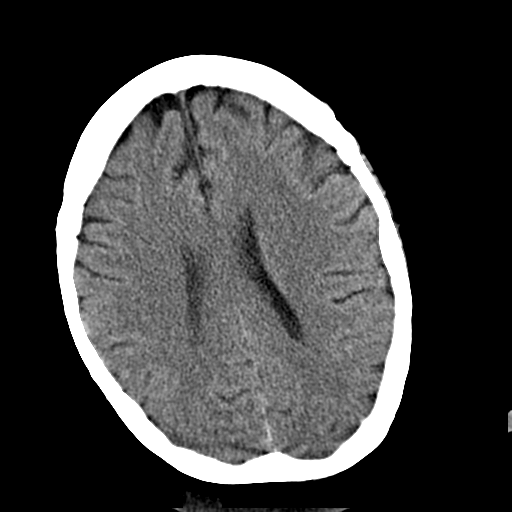
[im 20/30  brain]
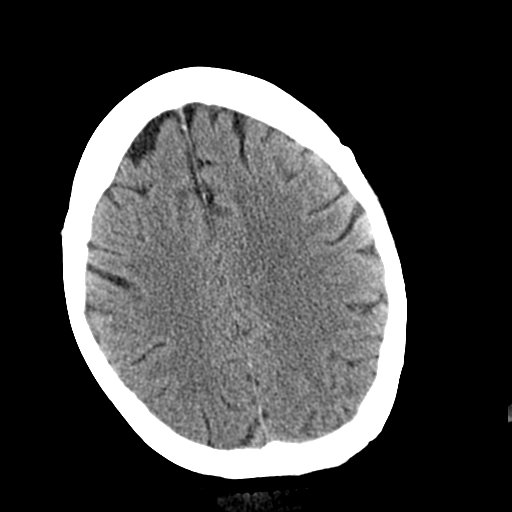
[im 22/30  brain]
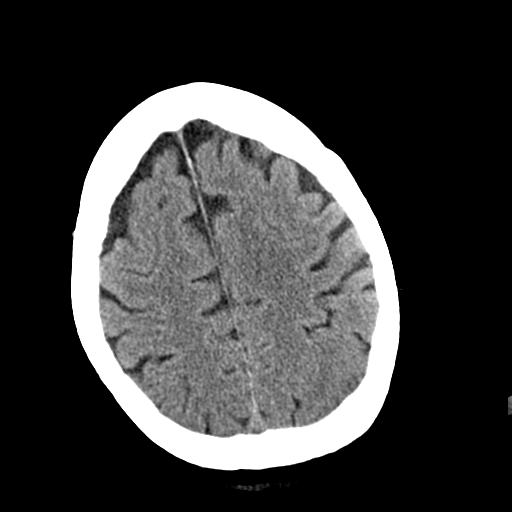
[im 23/30  brain]
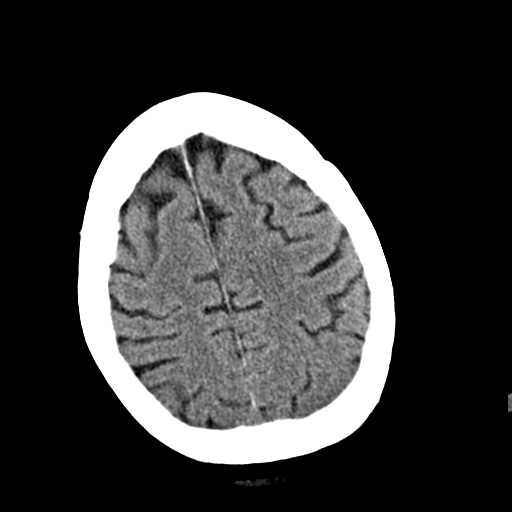
[im 23/30  bone]
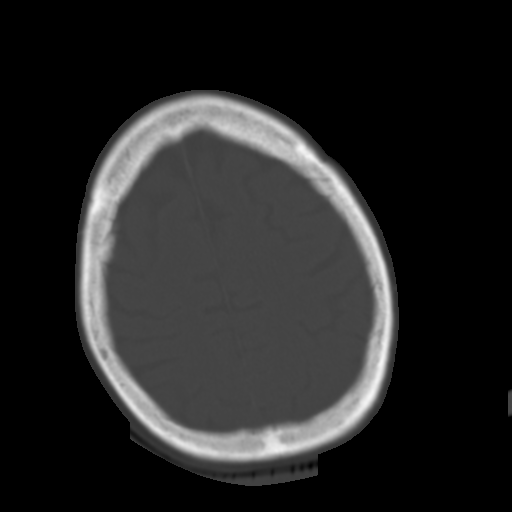
[im 25/30  brain]
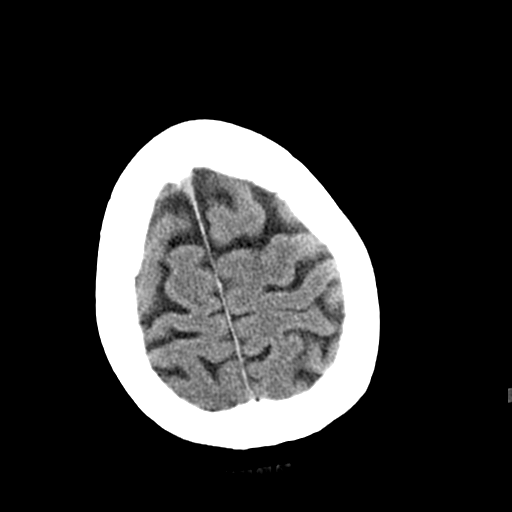
[im 27/30  brain]
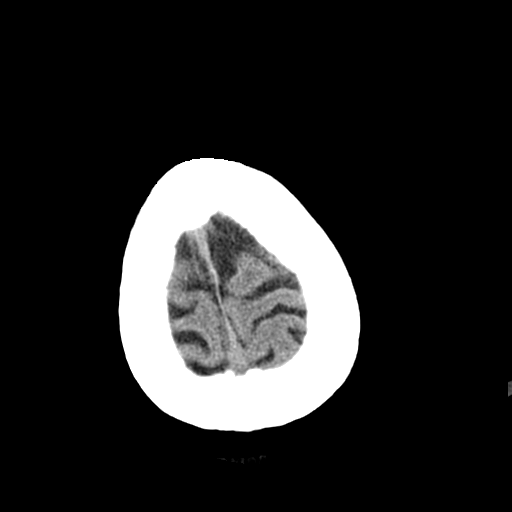
[im 29/30  brain]
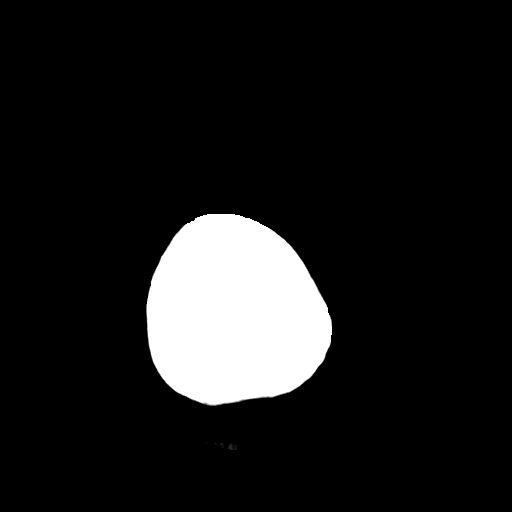

[16 of 30 positions shown; findings below may reference images not displayed]

FINDINGS: There is no intra or extra-axial fluid collection or mass
lesion.  The basilar cisterns and ventricles have a normal
appearance.  There is no CT evidence for acute infarction or
hemorrhage.

Bone windows show opacification of scattered ethmoid air cells.
Air fluid levels are identified within both maxillary sinuses.
There is irregularity of the left nasal bone which may be chronic.
No evidence for calvarial fracture.  There is atherosclerotic
calcification of the internal carotid arteries.
IMPRESSION: No CT evidence for acute intracranial abnormality.  Sinus disease
versus facial injury.  Correlation with physical exam findings is
suggested.

## 2009-05-02 IMAGING — CR DG LUMBAR SPINE COMPLETE 4+V
5 series · 5 of 5 positions shown · non-contrast
Comparison: 06/08/2008

CLINICAL DATA: Fall x2.  Fell last night and this morning.  Lower
back pain.

LUMBAR SPINE - COMPLETE 4+ VIEW

[t l-spine a.p.]
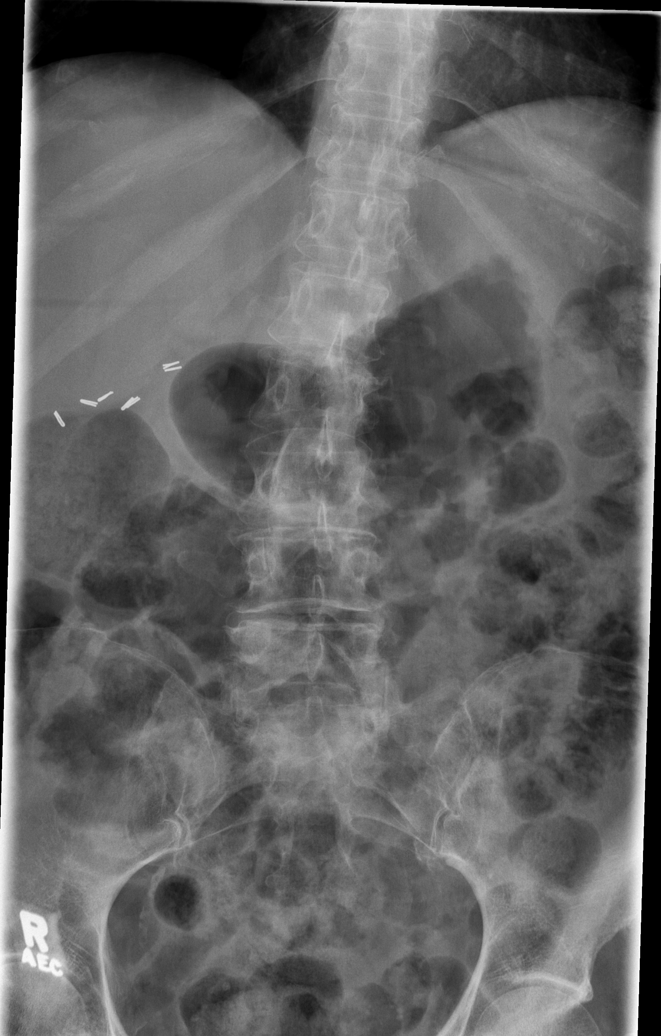

[t l-spine oblique exposure (1 of 2)]
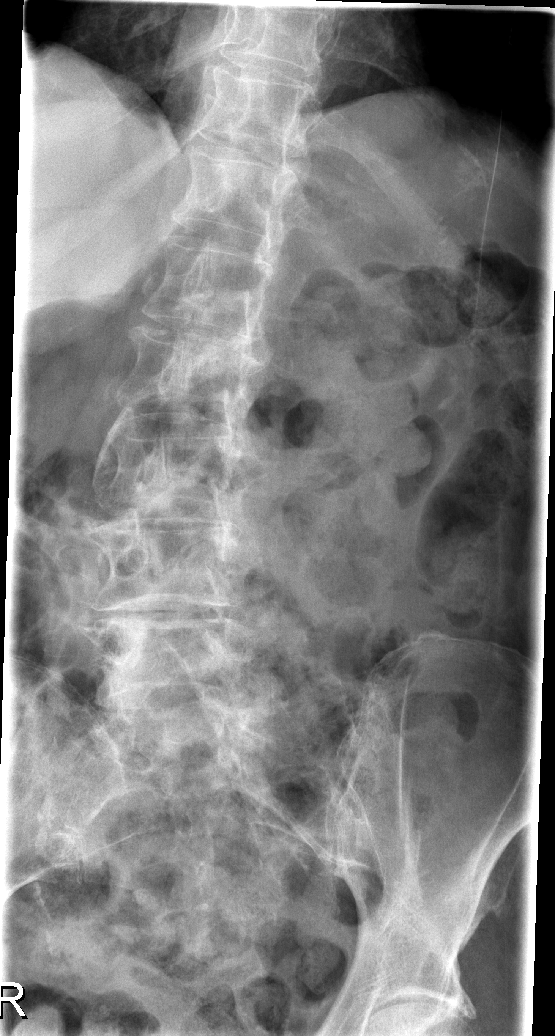

[t l-spine oblique exposure (2 of 2)]
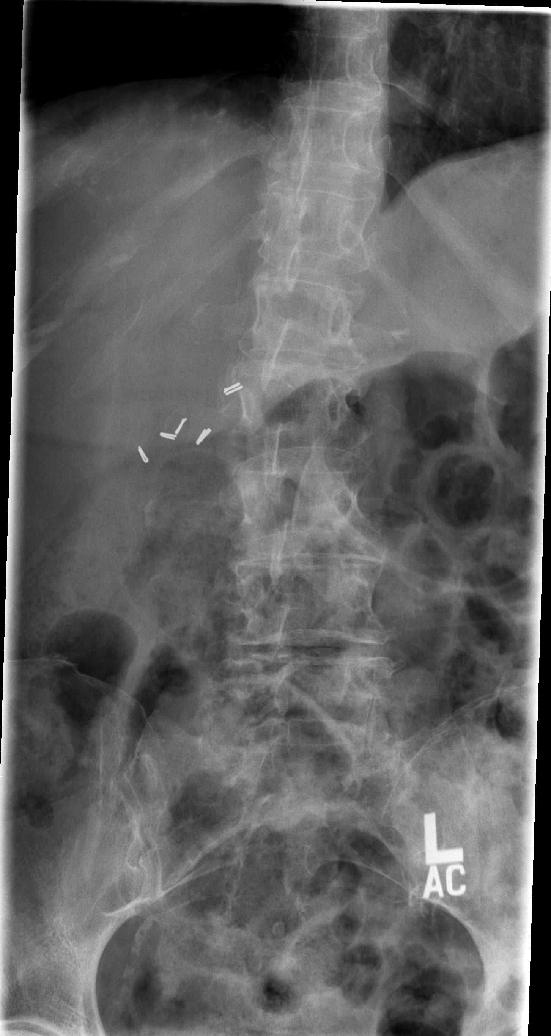

[w l-spine lat * (1 of 2)]
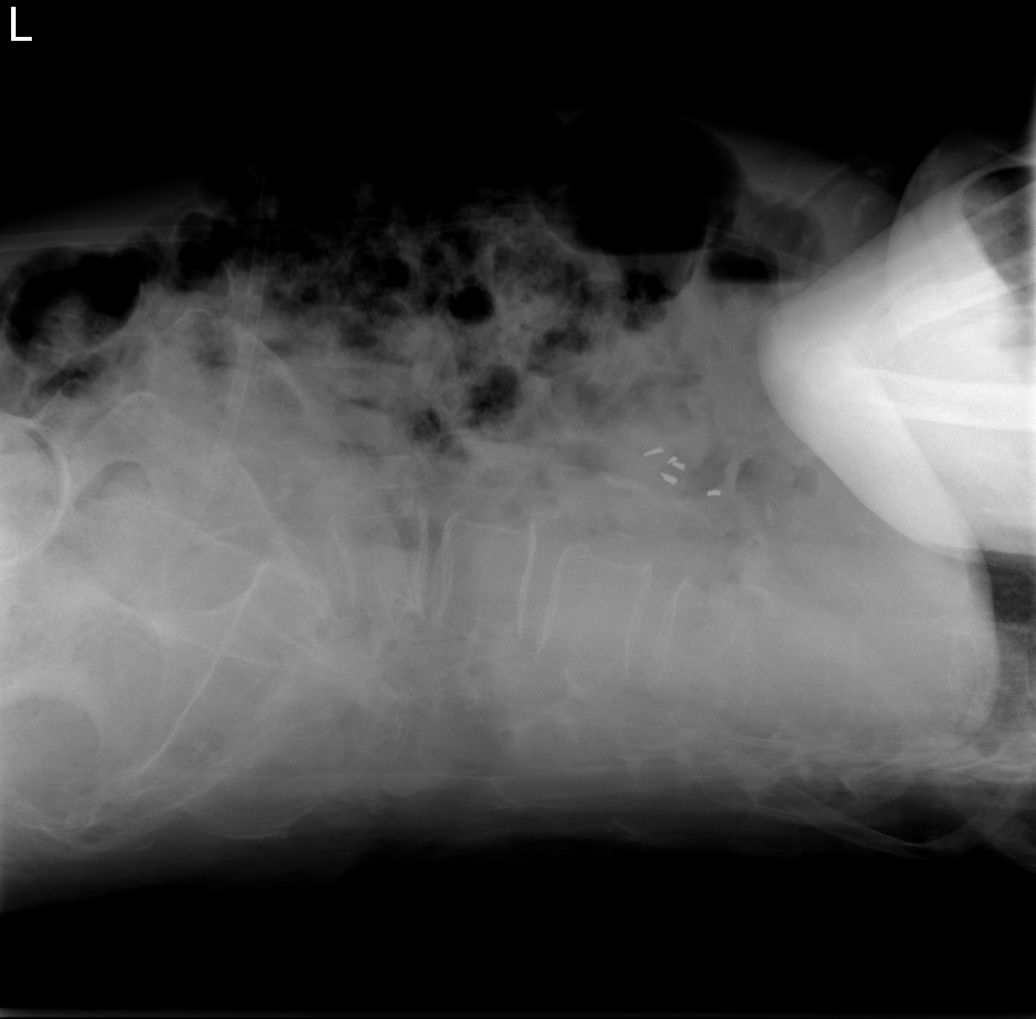

[w l-spine lat * (2 of 2)]
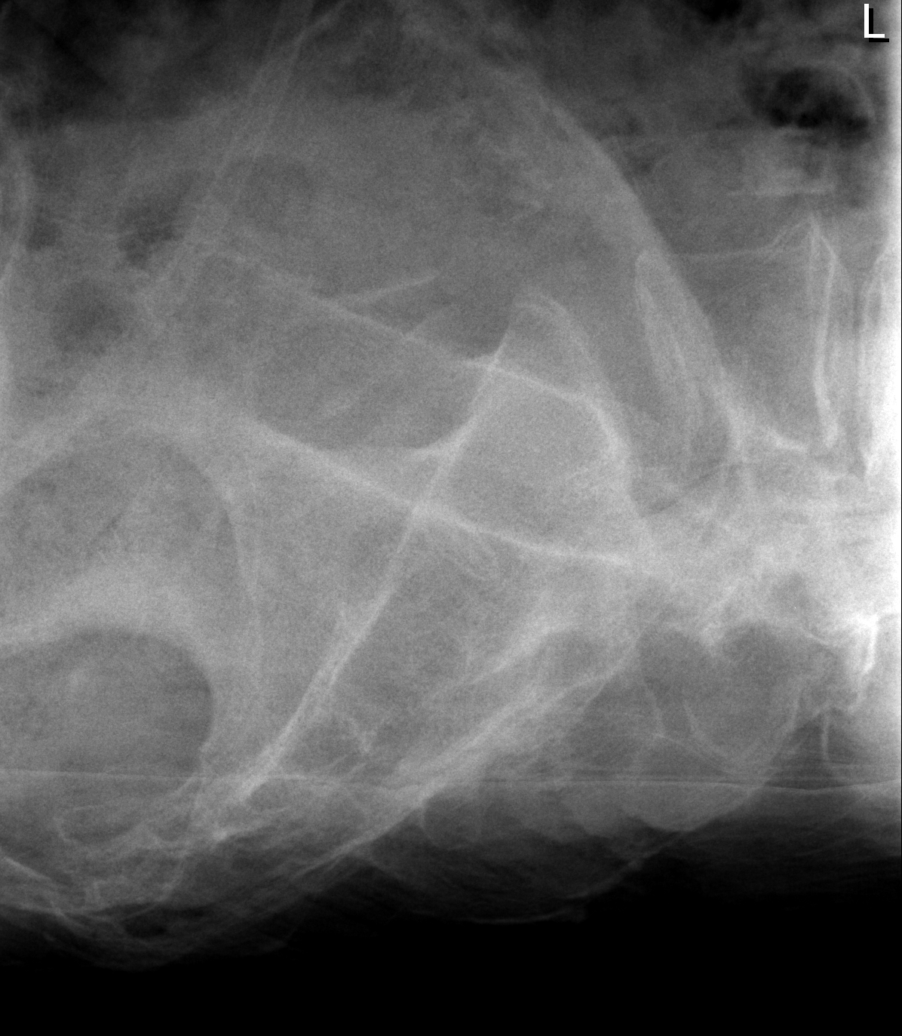

[5 of 5 positions shown; findings below may reference images not displayed]

FINDINGS: Detail on the lateral view is suboptimal because of
technique.  However there is no evidence for acute fracture or
dislocation.  Degenerative changes are identified, most notable at
L1-2.  Surgical clips are seen in the right upper quadrant.
Visualized bowel gas pattern is nonobstructive.
IMPRESSION: No evidence for acute abnormality.

## 2009-05-06 ENCOUNTER — Encounter: Payer: Self-pay | Admitting: Family Medicine

## 2009-05-08 ENCOUNTER — Ambulatory Visit: Payer: Self-pay | Admitting: Family Medicine

## 2009-05-20 ENCOUNTER — Emergency Department (HOSPITAL_COMMUNITY): Admission: EM | Admit: 2009-05-20 | Discharge: 2009-05-20 | Payer: Self-pay | Admitting: Emergency Medicine

## 2009-05-21 ENCOUNTER — Telehealth: Payer: Self-pay | Admitting: Family Medicine

## 2009-05-23 ENCOUNTER — Encounter: Payer: Self-pay | Admitting: Family Medicine

## 2009-06-03 ENCOUNTER — Telehealth: Payer: Self-pay | Admitting: *Deleted

## 2009-06-09 ENCOUNTER — Encounter: Payer: Self-pay | Admitting: Family Medicine

## 2009-06-18 ENCOUNTER — Encounter: Payer: Self-pay | Admitting: Family Medicine

## 2009-06-19 ENCOUNTER — Telehealth: Payer: Self-pay | Admitting: Family Medicine

## 2009-06-22 IMAGING — CT CT HEAD W/O CM
1 of 2 series · 16 of 30 positions shown, 20 images · non-contrast
Comparison: 03/30/2009.

CLINICAL DATA: Small midline occipital laceration following a fall
today.

CT HEAD WITHOUT CONTRAST
TECHNIQUE: Contiguous axial images were obtained from the base of
the skull through the vertex without contrast.

[Series 3: recon 2: brain · axial · 0.47mm/px · z∈[+110,+254]mm · 16 of 80 slices shown, 20 images]
[im 5/80  brain]
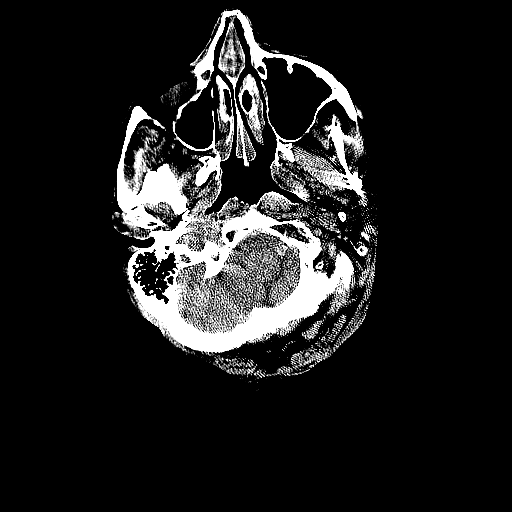
[im 5/80  bone]
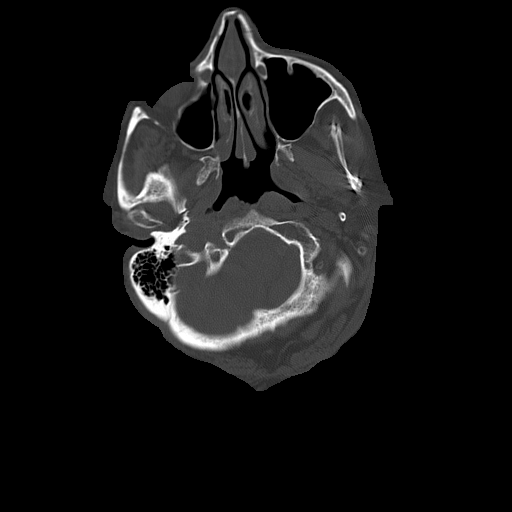
[im 9/80  brain]
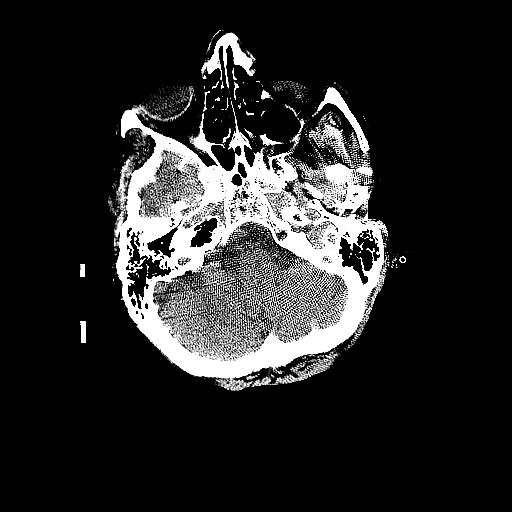
[im 13/80  brain]
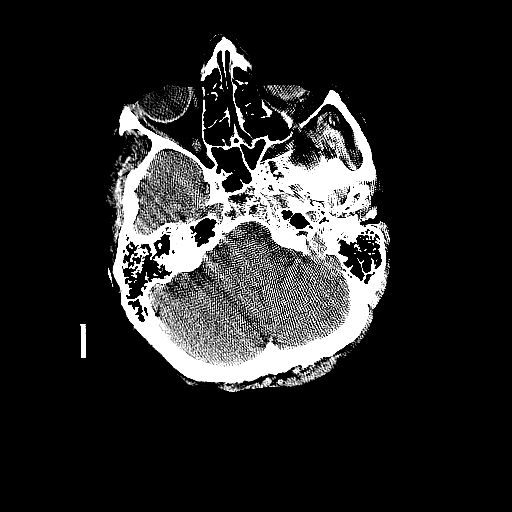
[im 17/80  brain]
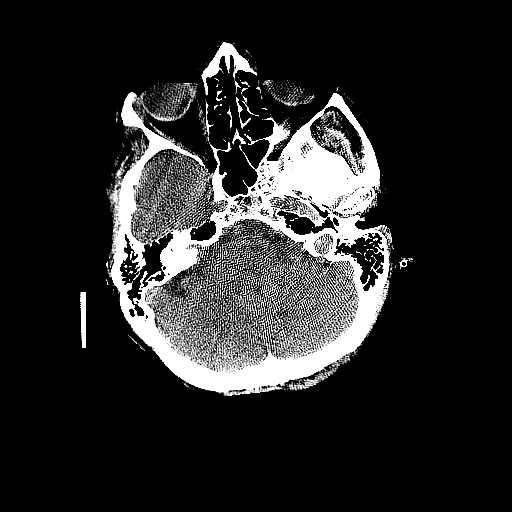
[im 25/80  brain]
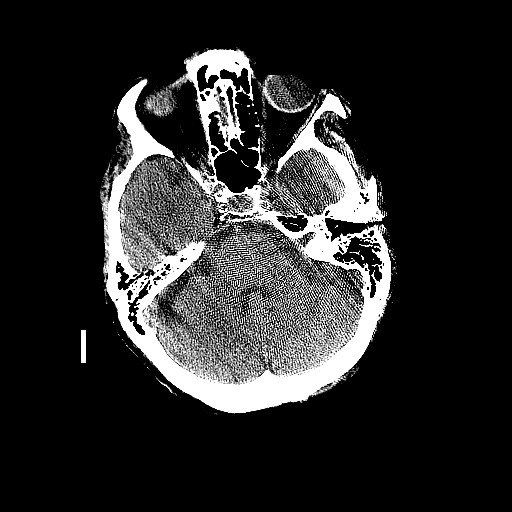
[im 25/80  bone]
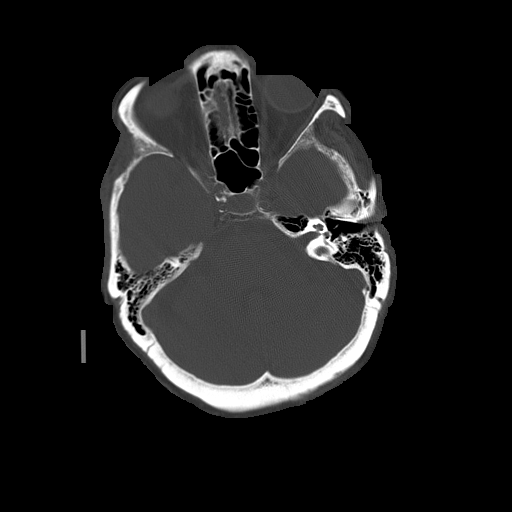
[im 30/80  brain]
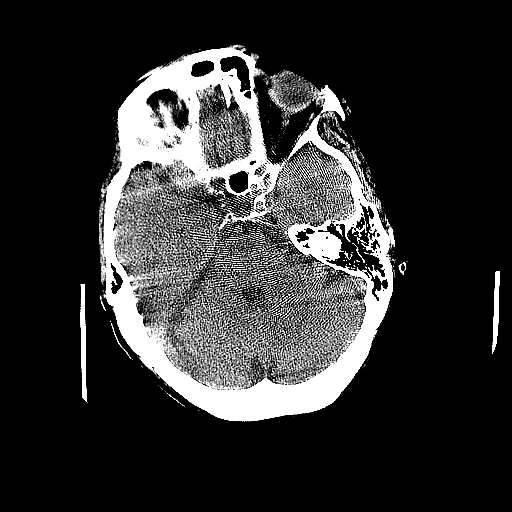
[im 34/80  brain]
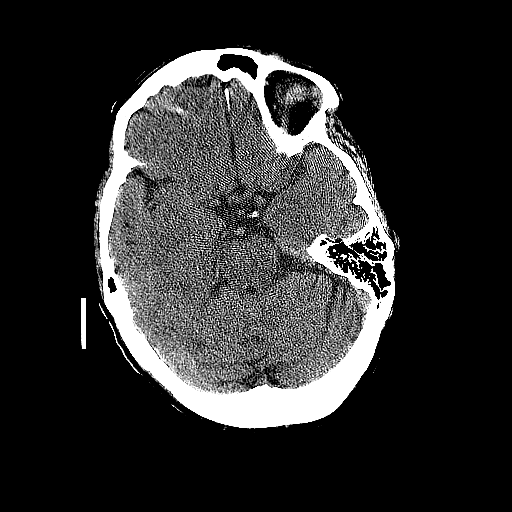
[im 38/80  brain]
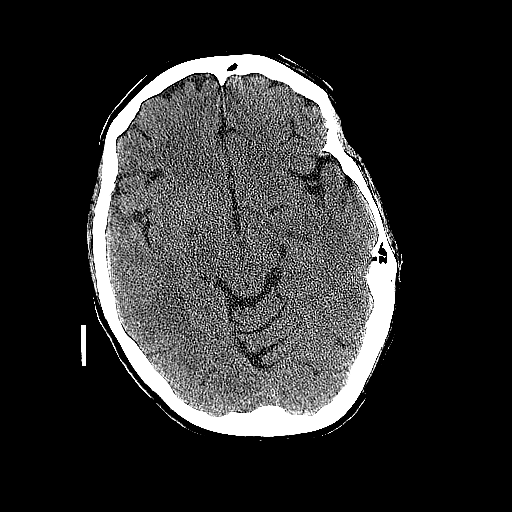
[im 42/80  brain]
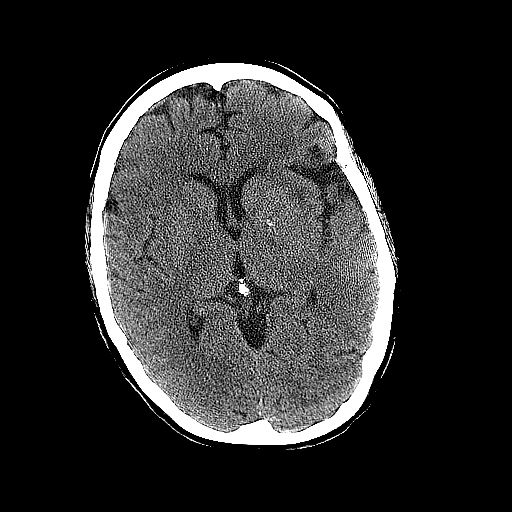
[im 42/80  bone]
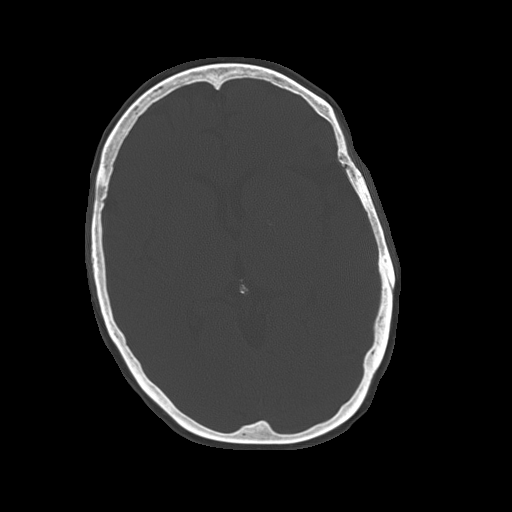
[im 46/80  brain]
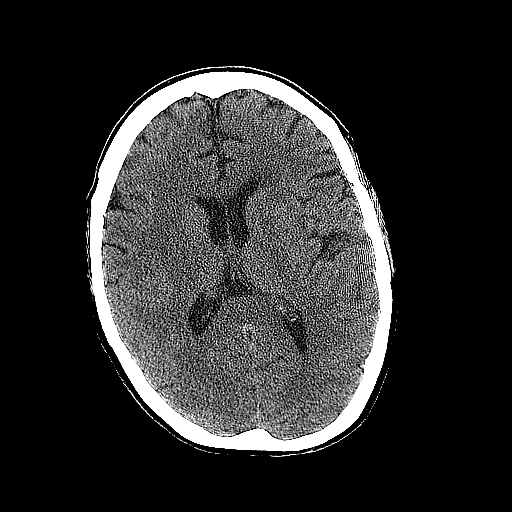
[im 50/80  brain]
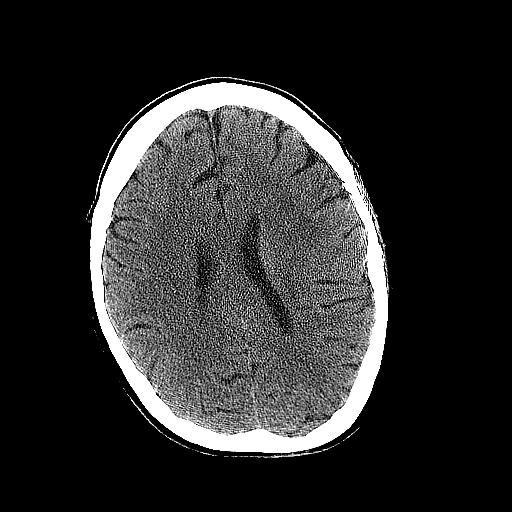
[im 55/80  brain]
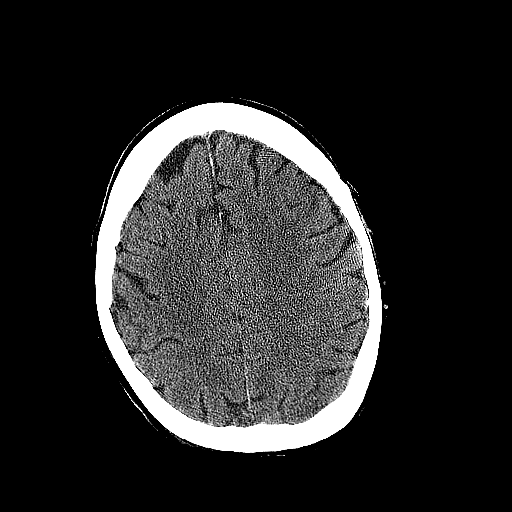
[im 63/80  brain]
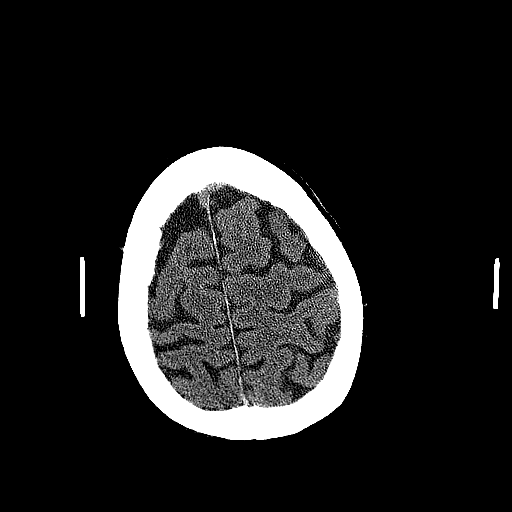
[im 63/80  bone]
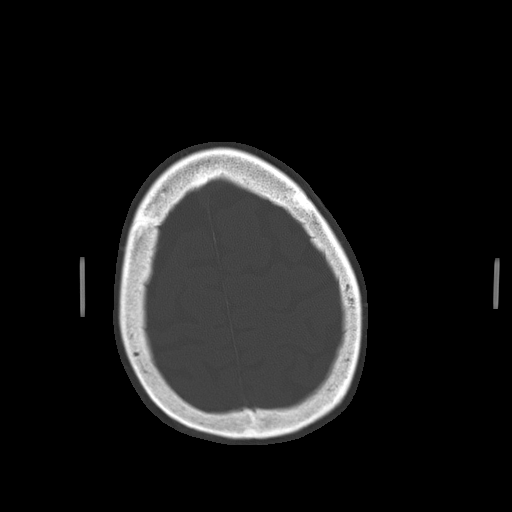
[im 67/80  brain]
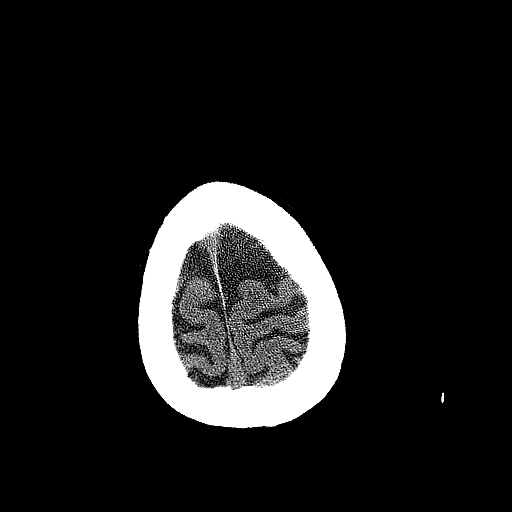
[im 71/80  brain]
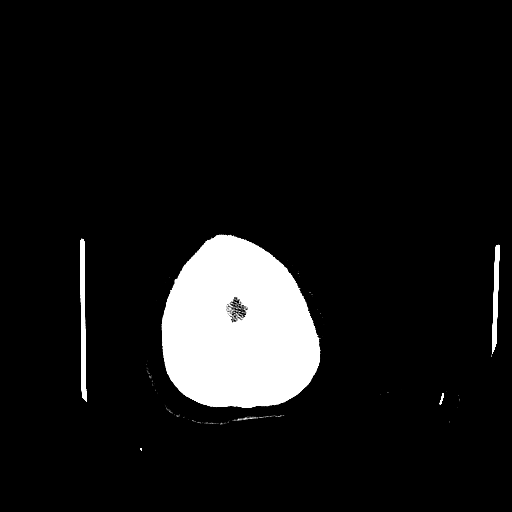
[im 75/80  brain]
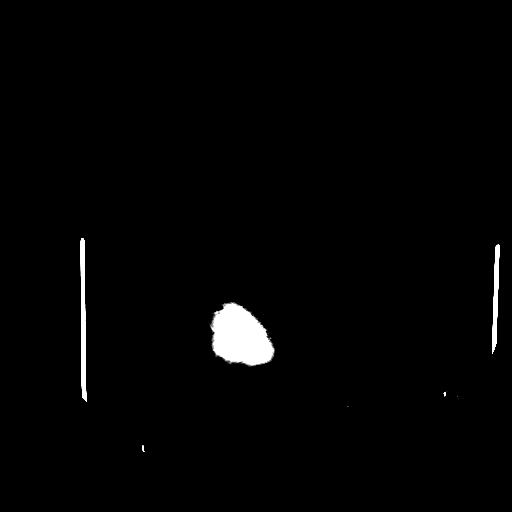

[16 of 30 positions shown; findings below may reference images not displayed]

FINDINGS: Stable mildly prominent subarachnoid spaces.  Normal size
and position of the ventricles.  Mild patchy white matter low
density in the left frontal lobe with little change.  No skull
fracture, intracranial hemorrhage or paranasal sinus air-fluid
levels.  Stable nasal bone fracture deformity.
IMPRESSION: Stable mild cortical atrophy, mild chronic small vessel white
matter ischemic changes in the left frontal lobe and nasal bone
fracture.  No acute abnormality.

## 2009-06-26 ENCOUNTER — Encounter: Payer: Self-pay | Admitting: Family Medicine

## 2009-06-26 ENCOUNTER — Ambulatory Visit: Payer: Self-pay | Admitting: Family Medicine

## 2009-06-26 LAB — CONVERTED CEMR LAB
CO2: 22 meq/L (ref 19–32)
Cholesterol: 117 mg/dL (ref 0–200)
Creatinine, Ser: 0.55 mg/dL (ref 0.40–1.20)
Glucose, Bld: 110 mg/dL — ABNORMAL HIGH (ref 70–99)
HCT: 38.4 % (ref 36.0–46.0)
HDL: 54 mg/dL (ref >39)
MCV: 96.2 fL (ref 78.0–100.0)
RBC: 3.99 M/uL (ref 3.87–5.11)
Total Bilirubin: 0.3 mg/dL (ref 0.3–1.2)
Total CHOL/HDL Ratio: 2.2
Triglycerides: 115 mg/dL (ref <150)
VLDL: 23 mg/dL (ref 0–40)
WBC: 5.9 10*3/uL (ref 4.0–10.5)

## 2009-06-27 ENCOUNTER — Encounter: Payer: Self-pay | Admitting: Family Medicine

## 2009-07-09 ENCOUNTER — Encounter: Payer: Self-pay | Admitting: Family Medicine

## 2009-07-14 ENCOUNTER — Ambulatory Visit: Payer: Self-pay | Admitting: Family Medicine

## 2009-07-16 ENCOUNTER — Encounter: Payer: Self-pay | Admitting: Family Medicine

## 2009-07-18 ENCOUNTER — Encounter: Payer: Self-pay | Admitting: Family Medicine

## 2009-07-24 ENCOUNTER — Telehealth: Payer: Self-pay | Admitting: Family Medicine

## 2009-07-28 ENCOUNTER — Telehealth: Payer: Self-pay | Admitting: Family Medicine

## 2009-07-30 ENCOUNTER — Encounter: Payer: Self-pay | Admitting: Family Medicine

## 2009-08-07 ENCOUNTER — Telehealth: Payer: Self-pay | Admitting: *Deleted

## 2009-08-12 ENCOUNTER — Telehealth: Payer: Self-pay | Admitting: Family Medicine

## 2009-08-20 ENCOUNTER — Telehealth: Payer: Self-pay | Admitting: Family Medicine

## 2009-08-20 ENCOUNTER — Emergency Department (HOSPITAL_COMMUNITY): Admission: EM | Admit: 2009-08-20 | Discharge: 2009-08-20 | Payer: Self-pay | Admitting: Emergency Medicine

## 2009-08-25 ENCOUNTER — Emergency Department (HOSPITAL_COMMUNITY): Admission: EM | Admit: 2009-08-25 | Discharge: 2009-08-25 | Payer: Self-pay | Admitting: Family Medicine

## 2009-08-27 ENCOUNTER — Telehealth: Payer: Self-pay | Admitting: Family Medicine

## 2009-08-27 ENCOUNTER — Encounter: Payer: Self-pay | Admitting: Family Medicine

## 2009-08-31 ENCOUNTER — Other Ambulatory Visit: Payer: Self-pay | Admitting: Emergency Medicine

## 2009-08-31 ENCOUNTER — Encounter: Payer: Self-pay | Admitting: Family Medicine

## 2009-09-01 ENCOUNTER — Inpatient Hospital Stay (HOSPITAL_COMMUNITY): Admission: EM | Admit: 2009-09-01 | Discharge: 2009-09-09 | Payer: Self-pay | Admitting: Family Medicine

## 2009-09-01 ENCOUNTER — Encounter: Payer: Self-pay | Admitting: Family Medicine

## 2009-09-01 ENCOUNTER — Ambulatory Visit: Payer: Self-pay | Admitting: Family Medicine

## 2009-09-11 ENCOUNTER — Ambulatory Visit: Payer: Self-pay | Admitting: Cardiology

## 2009-09-11 ENCOUNTER — Encounter: Payer: Self-pay | Admitting: Cardiology

## 2009-09-11 ENCOUNTER — Ambulatory Visit: Payer: Self-pay | Admitting: Family Medicine

## 2009-09-11 ENCOUNTER — Encounter: Payer: Self-pay | Admitting: Family Medicine

## 2009-09-12 ENCOUNTER — Encounter (INDEPENDENT_AMBULATORY_CARE_PROVIDER_SITE_OTHER): Payer: Self-pay | Admitting: Emergency Medicine

## 2009-09-12 ENCOUNTER — Inpatient Hospital Stay (HOSPITAL_COMMUNITY): Admission: EM | Admit: 2009-09-12 | Discharge: 2009-09-27 | Payer: Self-pay | Admitting: Emergency Medicine

## 2009-09-12 ENCOUNTER — Encounter: Payer: Self-pay | Admitting: Family Medicine

## 2009-09-12 DIAGNOSIS — R918 Other nonspecific abnormal finding of lung field: Secondary | ICD-10-CM

## 2009-09-15 ENCOUNTER — Telehealth: Payer: Self-pay | Admitting: Family Medicine

## 2009-09-23 ENCOUNTER — Ambulatory Visit: Payer: Self-pay | Admitting: Internal Medicine

## 2009-09-23 ENCOUNTER — Encounter: Payer: Self-pay | Admitting: Family Medicine

## 2009-09-25 ENCOUNTER — Telehealth: Payer: Self-pay | Admitting: Family Medicine

## 2009-09-26 ENCOUNTER — Encounter: Payer: Self-pay | Admitting: Family Medicine

## 2009-10-02 ENCOUNTER — Encounter: Payer: Self-pay | Admitting: Family Medicine

## 2009-10-02 ENCOUNTER — Emergency Department (HOSPITAL_COMMUNITY): Admission: EM | Admit: 2009-10-02 | Discharge: 2009-10-02 | Payer: Self-pay | Admitting: Emergency Medicine

## 2009-10-02 DIAGNOSIS — Z86711 Personal history of pulmonary embolism: Secondary | ICD-10-CM | POA: Insufficient documentation

## 2009-10-02 DIAGNOSIS — I82409 Acute embolism and thrombosis of unspecified deep veins of unspecified lower extremity: Secondary | ICD-10-CM

## 2009-10-02 HISTORY — DX: Acute embolism and thrombosis of unspecified deep veins of unspecified lower extremity: I82.409

## 2009-10-06 ENCOUNTER — Ambulatory Visit: Payer: Self-pay | Admitting: Family Medicine

## 2009-10-06 ENCOUNTER — Encounter: Payer: Self-pay | Admitting: Family Medicine

## 2009-10-07 ENCOUNTER — Ambulatory Visit: Admission: RE | Admit: 2009-10-07 | Discharge: 2009-10-07 | Payer: Self-pay | Admitting: Family Medicine

## 2009-10-07 ENCOUNTER — Telehealth: Payer: Self-pay | Admitting: Family Medicine

## 2009-10-07 ENCOUNTER — Encounter: Payer: Self-pay | Admitting: Family Medicine

## 2009-10-07 ENCOUNTER — Ambulatory Visit: Payer: Self-pay | Admitting: Vascular Surgery

## 2009-10-08 ENCOUNTER — Encounter: Payer: Self-pay | Admitting: Family Medicine

## 2009-10-08 ENCOUNTER — Telehealth: Payer: Self-pay | Admitting: Family Medicine

## 2009-10-14 IMAGING — CR DG CHEST 1V PORT
1 series · 1 of 1 positions shown · non-contrast
Comparison: 03/23/2009.

CLINICAL DATA: Syncope.  Status post CPR.

PORTABLE CHEST - 1 VIEW

[AP]
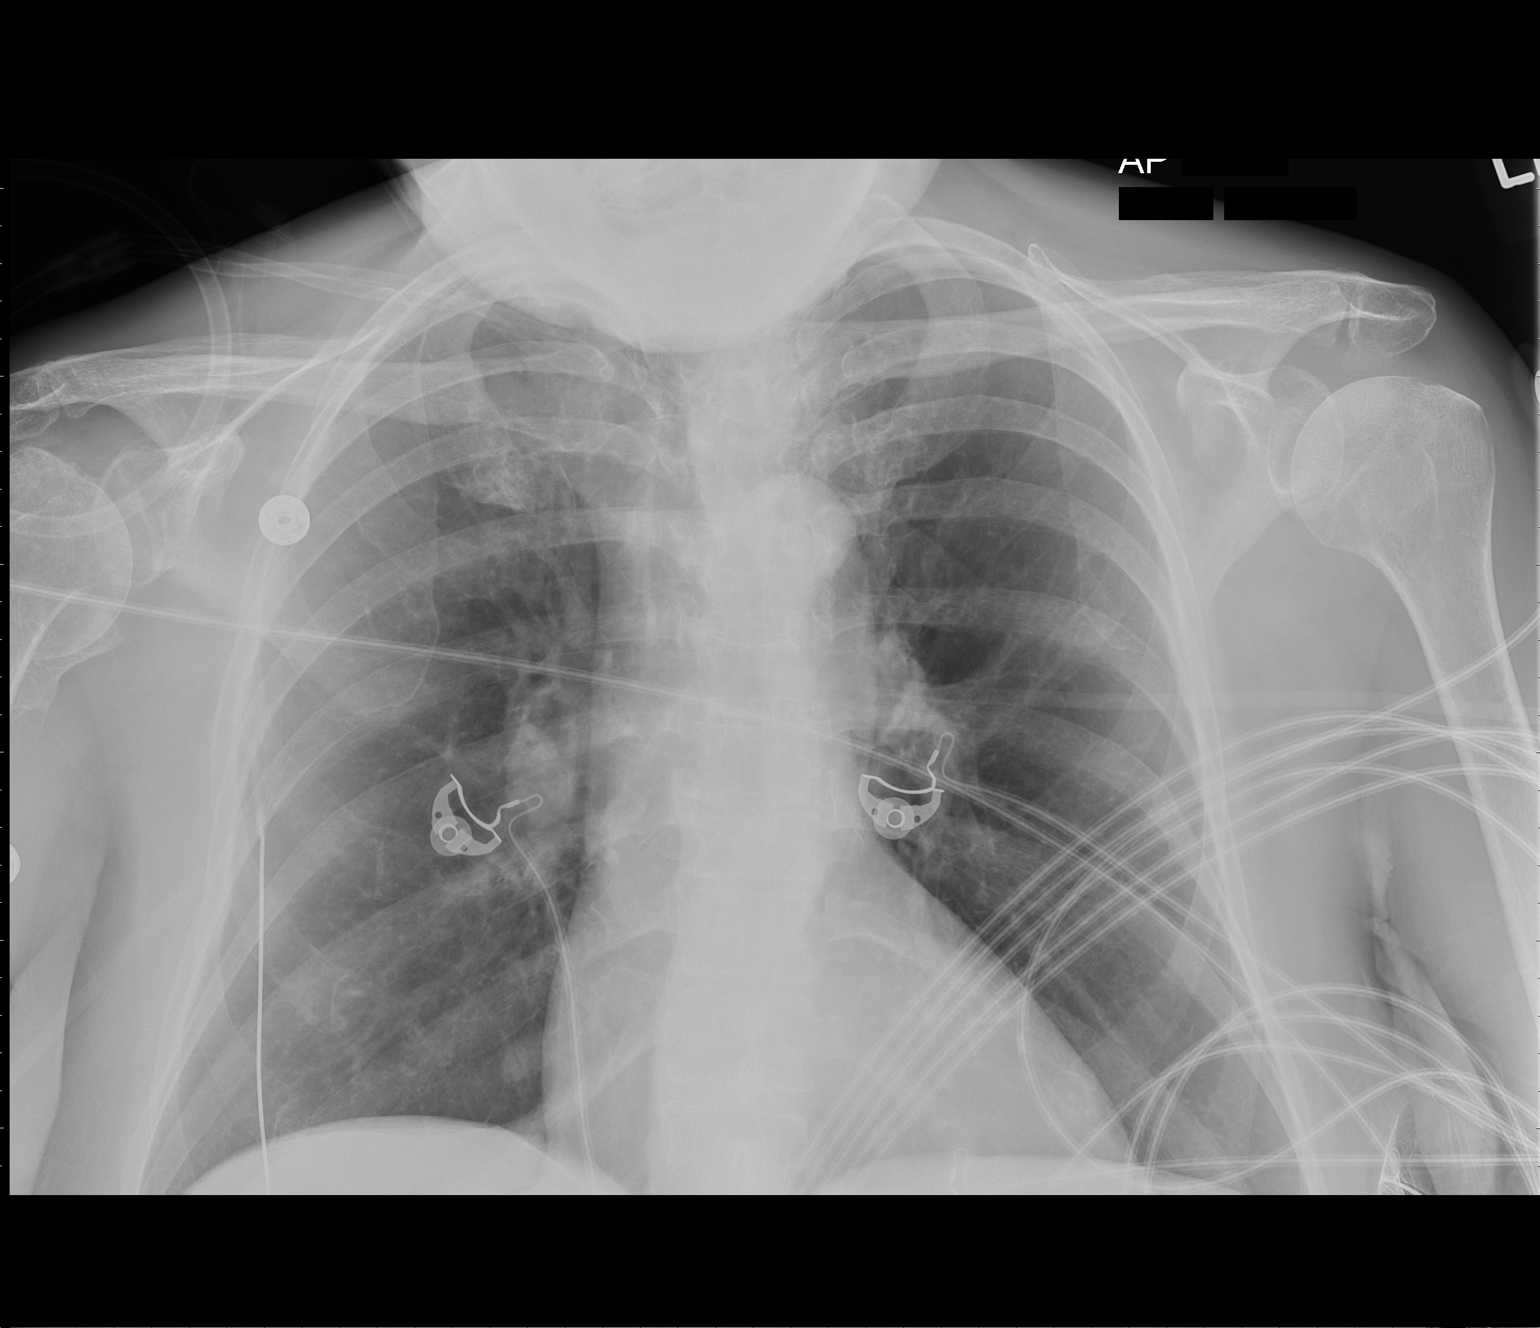

[1 of 1 positions shown; findings below may reference images not displayed]

FINDINGS: The cardiopericardial silhouette is within normal limits
for size.  The lungs are clear.  The visualized soft tissues and
bony thorax are unremarkable.
IMPRESSION: No acute cardiopulmonary disease.

## 2009-10-15 ENCOUNTER — Encounter: Payer: Self-pay | Admitting: Family Medicine

## 2009-10-15 IMAGING — CT CT ANGIO CHEST
2 of 6 series · 19 of 36 positions shown · IV contrast (APPLIED)
Comparison: Chest radiograph today.

CLINICAL DATA: Elevated D-dimer.  Chest pain.  Post cardiac
arrest.

CT ANGIOGRAPHY CHEST WITH CONTRAST
TECHNIQUE: Multidetector CT imaging of the chest was performed
using the standard protocol during bolus administration of
intravenous contrast. Multiplanar CT image reconstructions
including MIPs were obtained to evaluate the vascular anatomy.
Contrast: 100 ml Emnipaque-5MM IV.

[Series 5: pulm embolism 1.0 b25f thins · axial · 0.71mm/px · z∈[-265,+26]mm · 18 of 325 slices shown]
[im 17/325  lung]
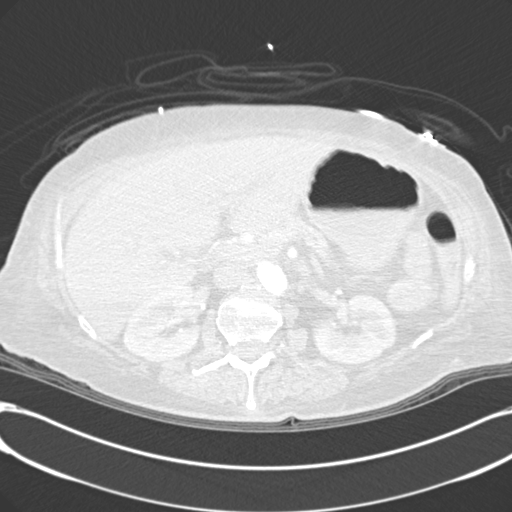
[im 33/325  mediastinal]
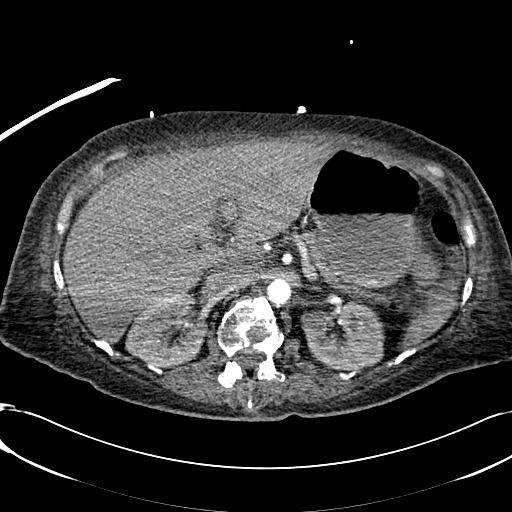
[im 49/325  lung]
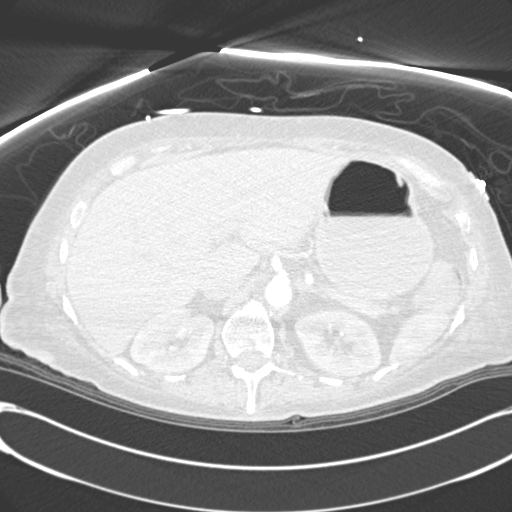
[im 65/325  mediastinal]
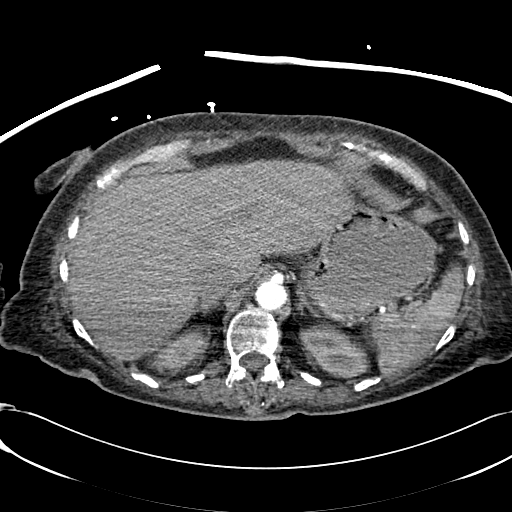
[im 82/325  lung]
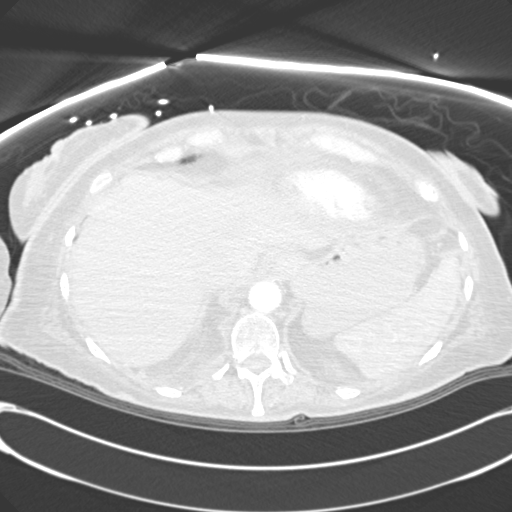
[im 98/325  mediastinal]
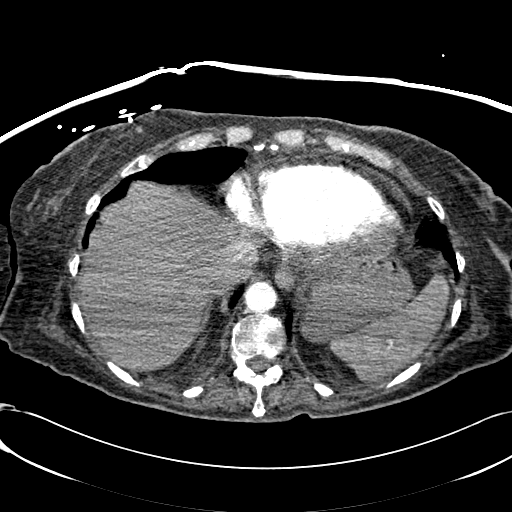
[im 114/325  lung]
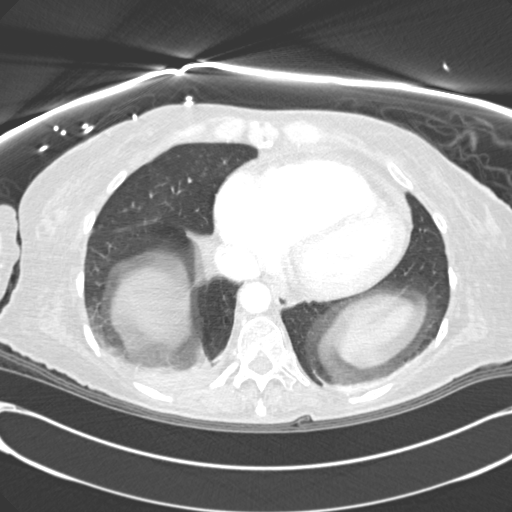
[im 130/325  mediastinal]
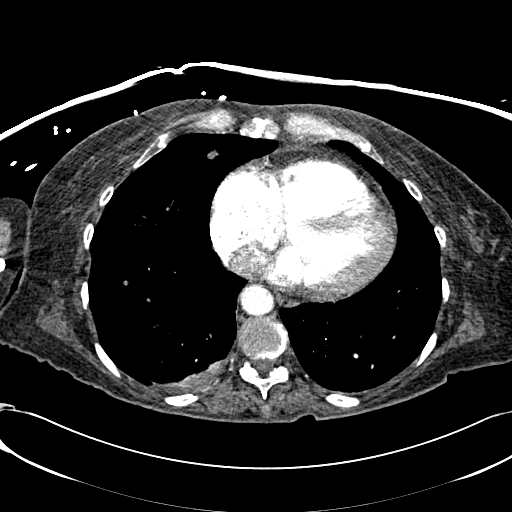
[im 146/325  lung]
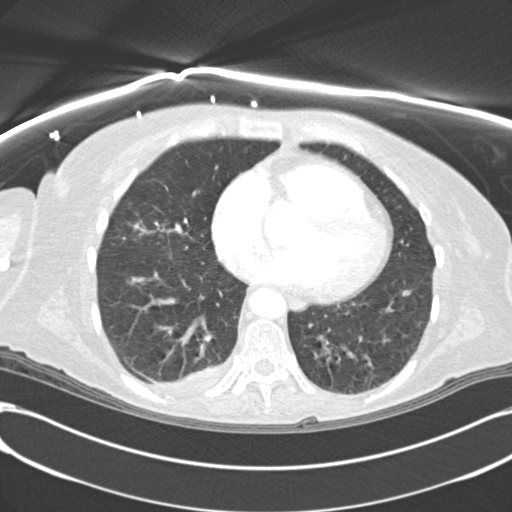
[im 179/325  mediastinal]
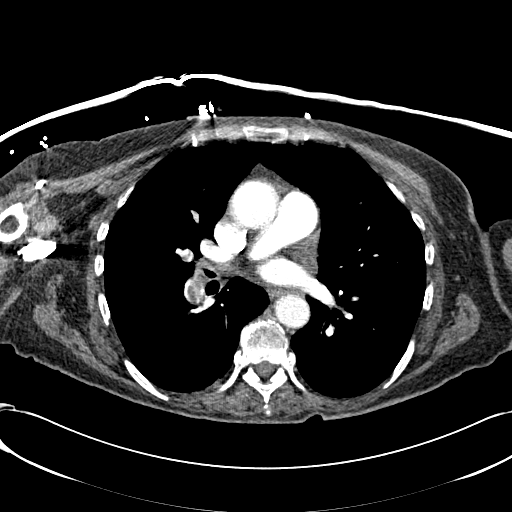
[im 195/325  lung]
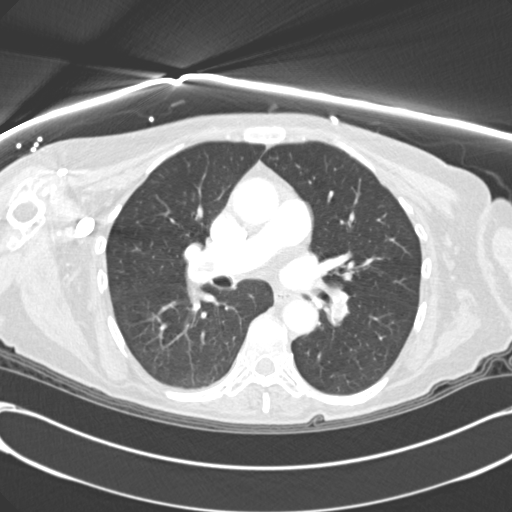
[im 211/325  mediastinal]
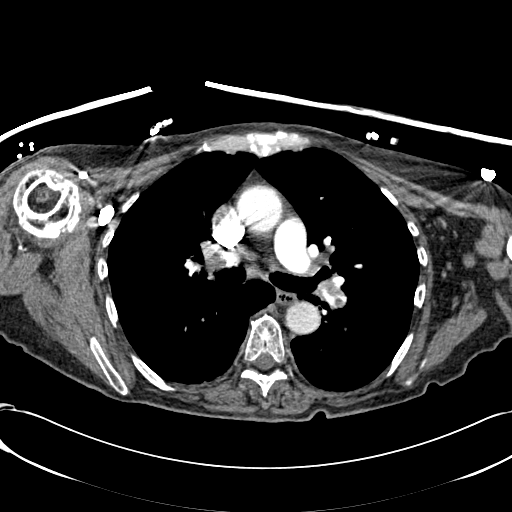
[im 227/325  lung]
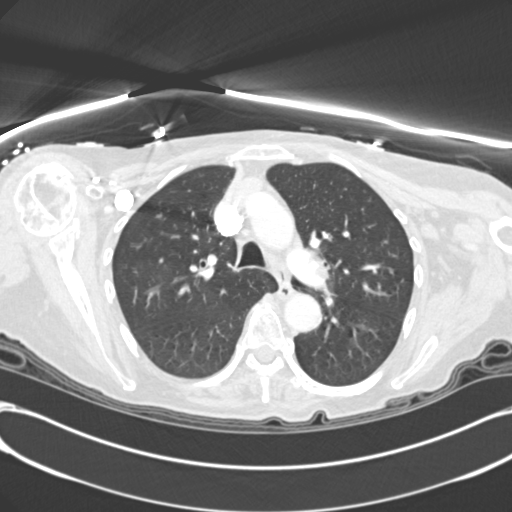
[im 244/325  mediastinal]
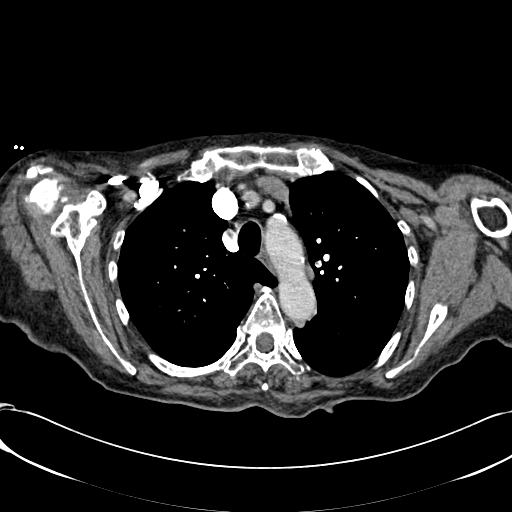
[im 260/325  lung]
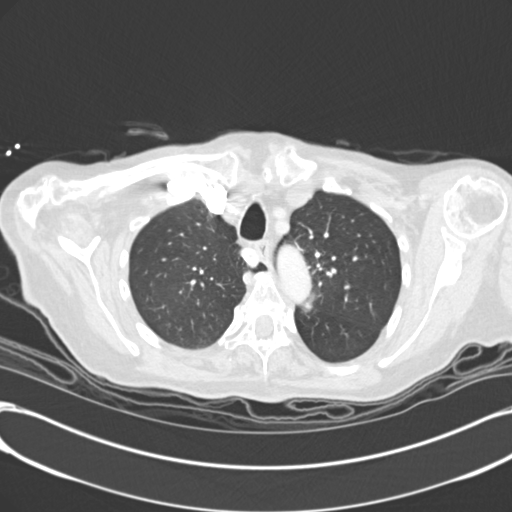
[im 276/325  mediastinal]
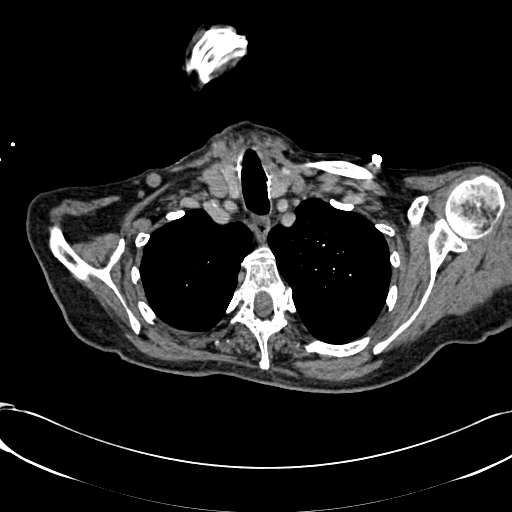
[im 292/325  lung]
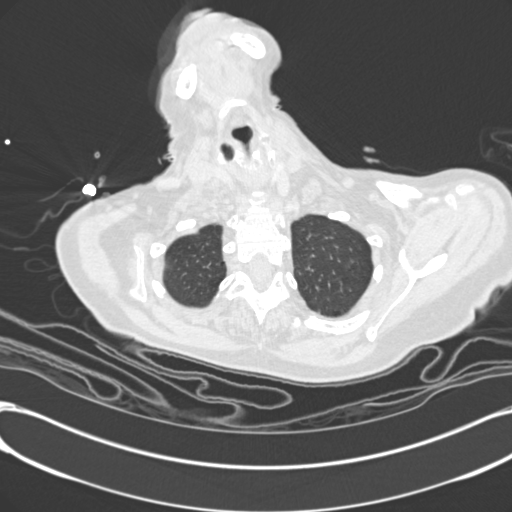
[im 308/325  mediastinal]
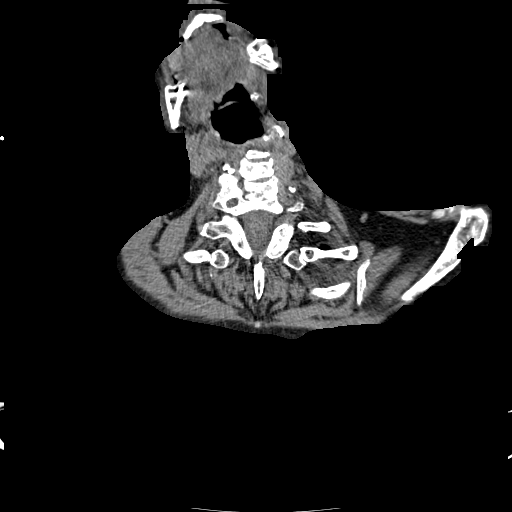

[Series 9: pulm embolism 2.0 spo thins · coronal · 0.71mm/px · 1 of 96 slices shown]
[im 48/96  mediastinal]
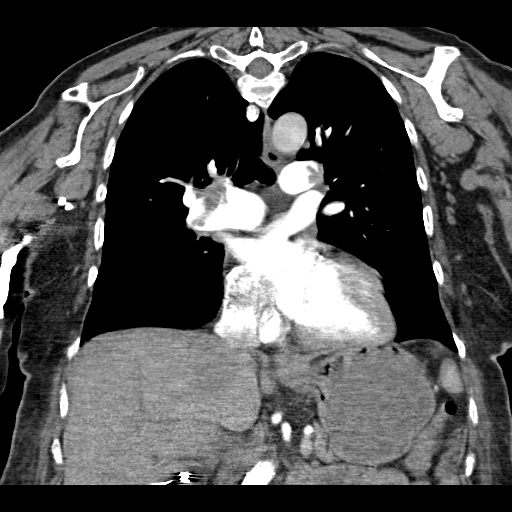

[19 of 36 positions shown; findings below may reference images not displayed]

FINDINGS: There are extensive bilateral pulmonary emboli involving
the upper and lower lobes and right middle lobe intraluminal clot
is appreciated in the lobe are and segmental branches.  No central
saddle type embolus is noted.  Extensive clot involves the
descending portions of the right and left pulmonary arteries.
Small right pleural effusion.  There is a 1.3 x 1.2 right middle
lobe pulmonary nodule.  Primary lung carcinoma cannot be excluded.

Review of the MIP images confirms the above findings.
IMPRESSION: Bilateral extensive PE.

Right middle lobe nodule.

Critical test results telephoned to ATOC, JHON LENIN at the time of
interpretation on 09/12/2009 at 7666 hours.

## 2009-10-25 IMAGING — CR DG ABD PORTABLE 1V
1 series · 1 of 1 positions shown · non-contrast
Comparison: Lumbar spine films 03/30/2009

CLINICAL DATA: Abdominal pain status post CPR.

ABDOMEN - 1 VIEW

[view not recorded]
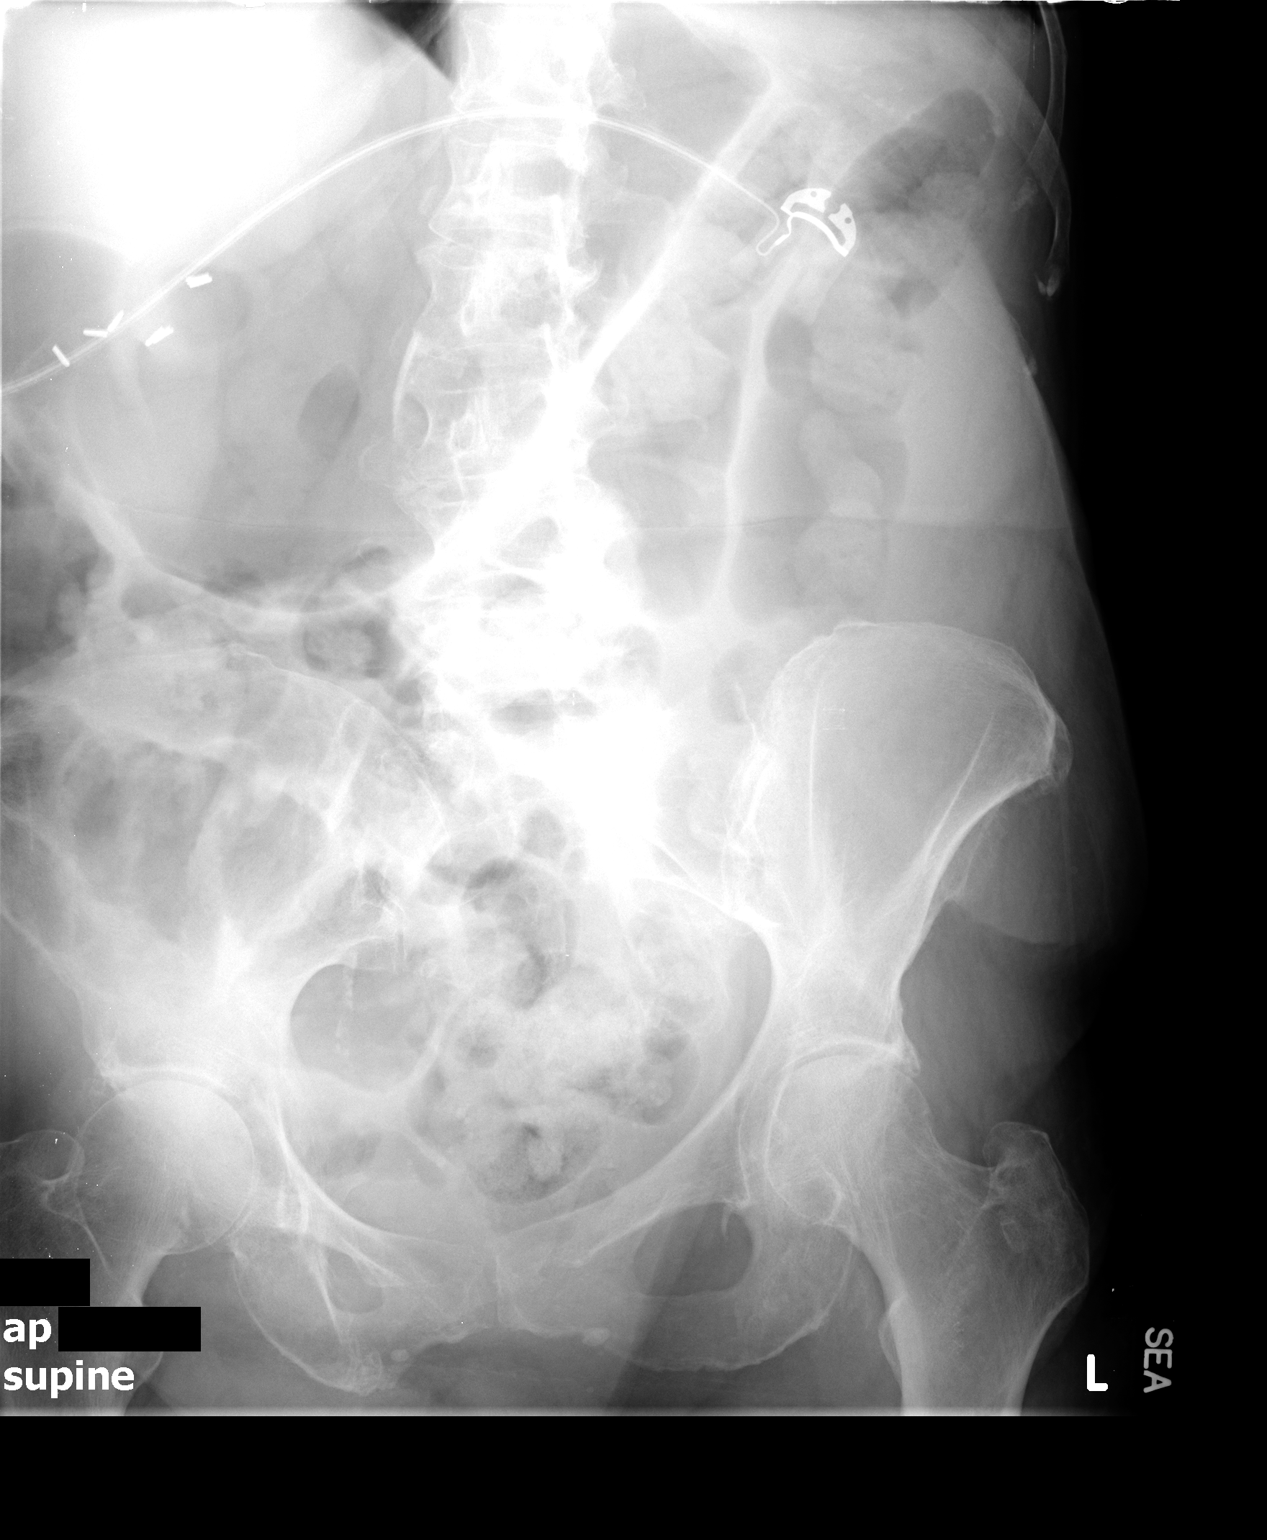

[1 of 1 positions shown; findings below may reference images not displayed]

FINDINGS: Moderate gaseous distention is present in the stomach.
There is mild gaseous distention of the bowel is well.  There is no
evidence for obstruction or free air.  Degenerative changes are
noted in the axial skeleton.
IMPRESSION: Gaseous distention of the stomach and bowel without evidence for
obstruction or free air.

## 2009-10-25 IMAGING — CT CT PELVIS W/O CM
2 of 4 series · 16 of 46 positions shown, 18 images · non-contrast
Comparison: 02/20/2007 and 09/12/2009

CT ABDOMEN

CLINICAL DATA: Sudden drop in hemoglobin.  Evaluate for intra
abdominal or pelvic hematoma.

CT ABDOMEN AND PELVIS WITHOUT CONTRAST
TECHNIQUE: Multidetector CT imaging of the abdomen and pelvis was
performed following the standard protocol without intravenous
contrast.

[Series 2: routine abdomen · axial · 0.73mm/px · z∈[-356,+34]mm · 13 of 86 slices shown, 15 images]
[im 4/86  soft-tissue]
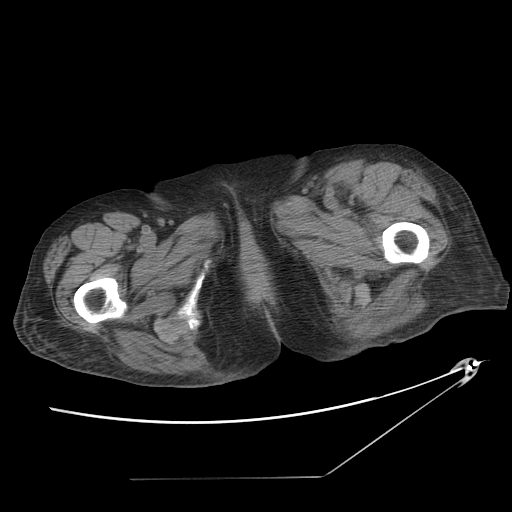
[im 4/86  bone]
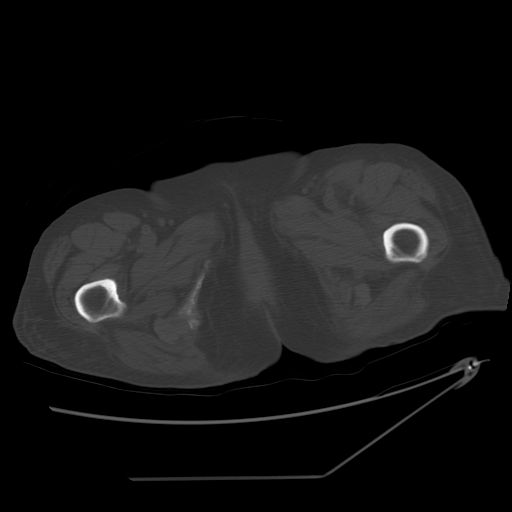
[im 11/86  soft-tissue]
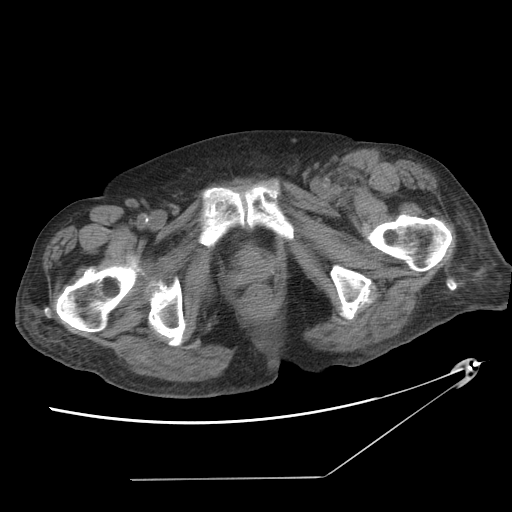
[im 18/86  soft-tissue]
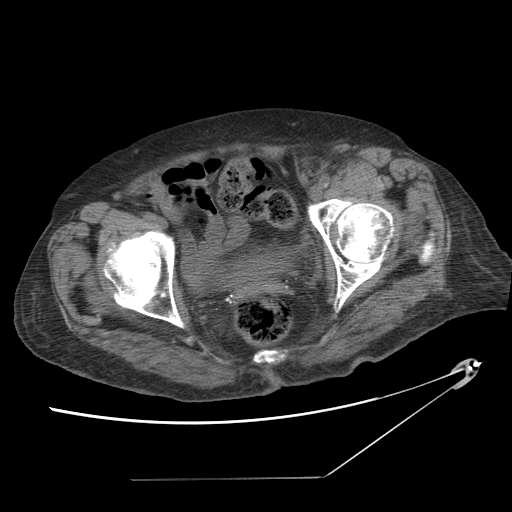
[im 25/86  soft-tissue]
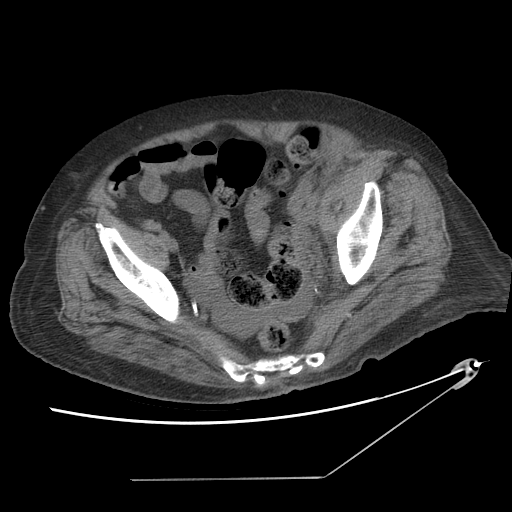
[im 29/86  soft-tissue]
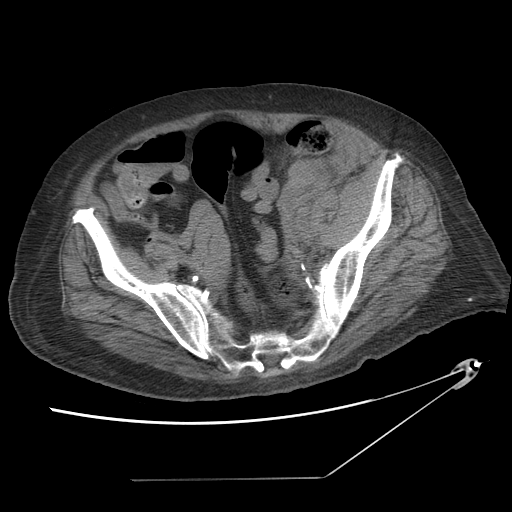
[im 36/86  soft-tissue]
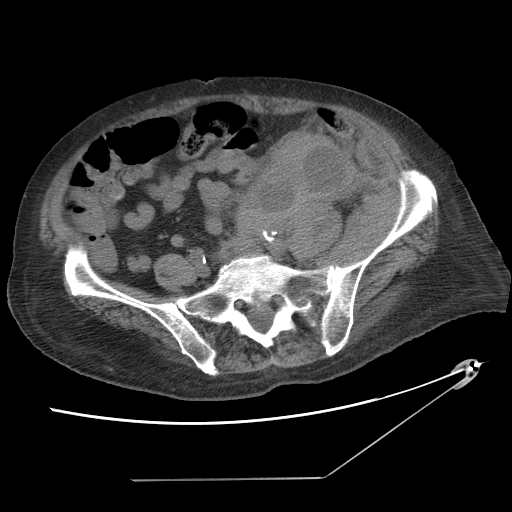
[im 43/86  soft-tissue]
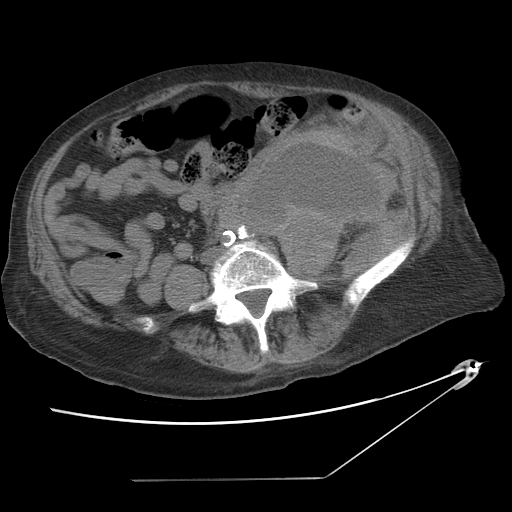
[im 50/86  soft-tissue]
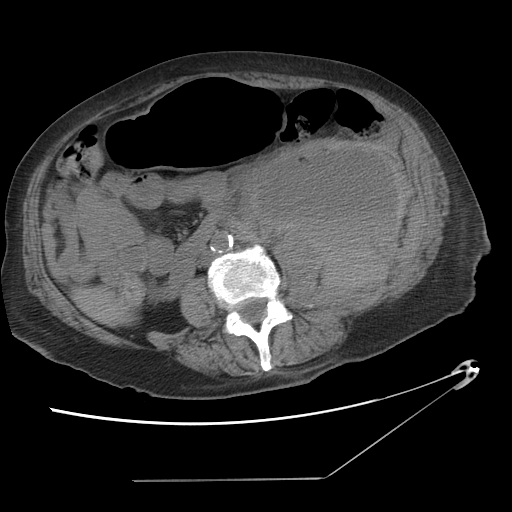
[im 57/86  soft-tissue]
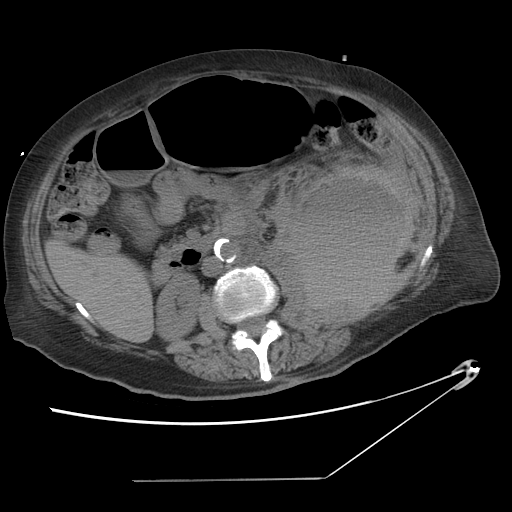
[im 57/86  bone]
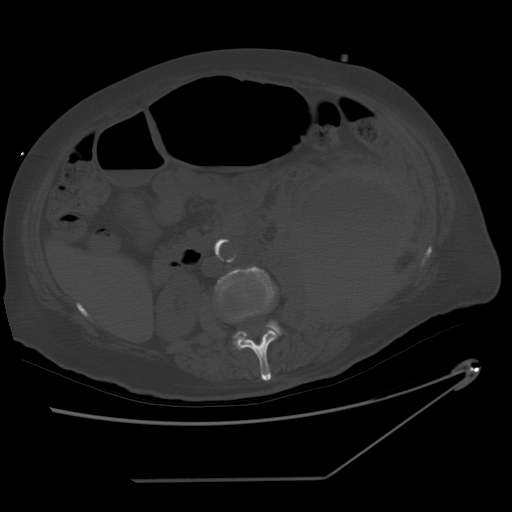
[im 61/86  soft-tissue]
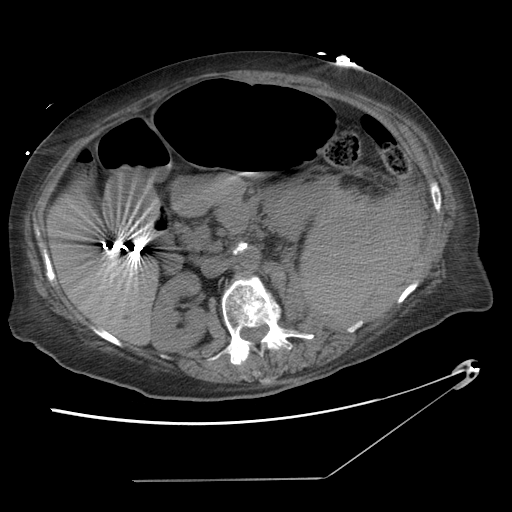
[im 68/86  soft-tissue]
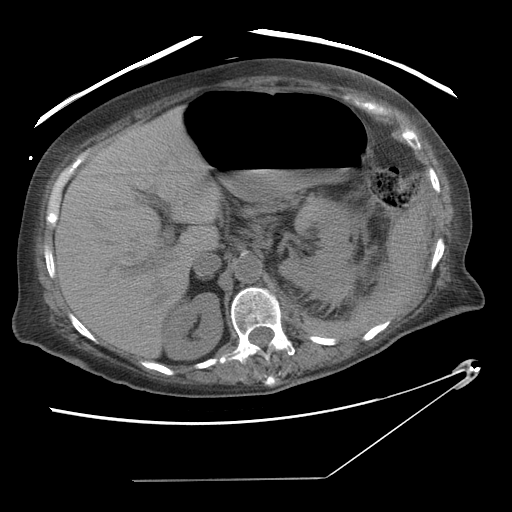
[im 75/86  soft-tissue]
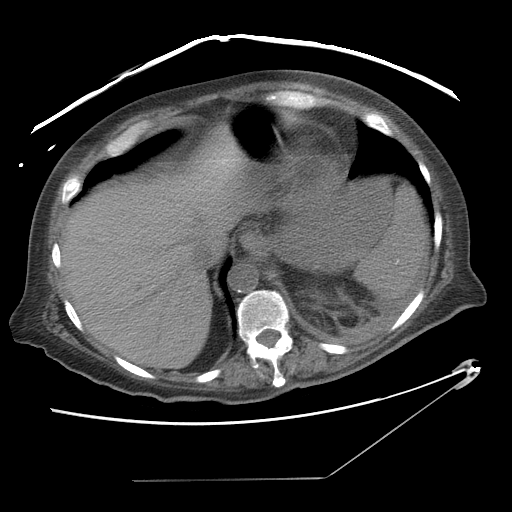
[im 82/86  soft-tissue]
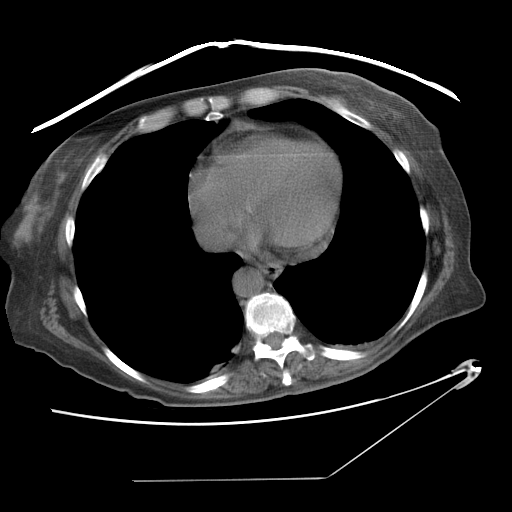

[Series 401: reformatted · coronal · 0.85mm/px · 3 of 98 slices shown]
[im 33/98  soft-tissue]
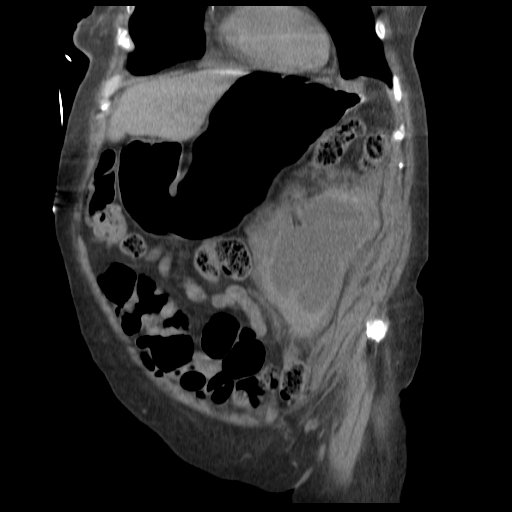
[im 44/98  soft-tissue]
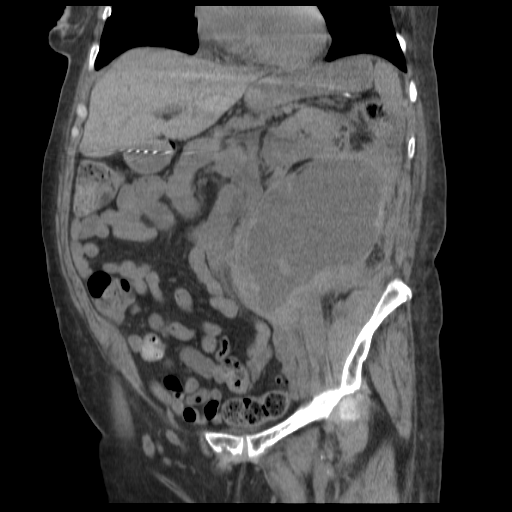
[im 54/98  soft-tissue]
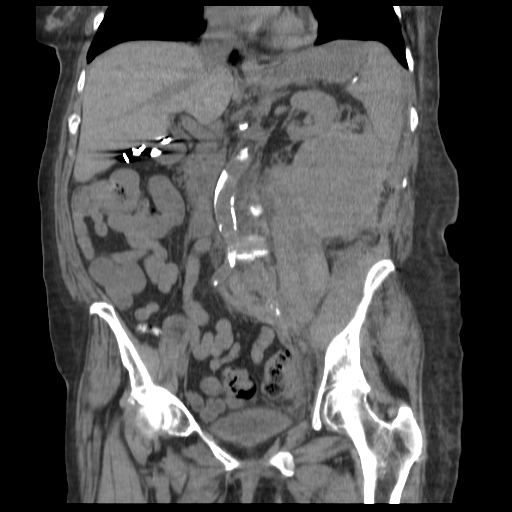

[16 of 46 positions shown; findings below may reference images not displayed]

FINDINGS: A 9 mm right middle lobe nodule (image 3) has decreased
in size since 09/12/2009.
A small pericardial effusion is present.

A moderate to large left retroperitoneal hemorrhage/hematoma is
identified measuring 10.5 x 10.8 cm in greatest transverse
dimension. Hemorrhage/hematoma within the left psoas muscles also
identified.

The liver, adrenal glands, right kidney, pancreas, and spleen are
unremarkable.
The left kidney is displaced anteriorly by the left retroperitoneal
hematoma.
A small amount of free fluid in the left abdomen is noted.

The patient status post cholecystectomy.

Please note that parenchymal abnormalities may be missed as
intravenous contrast was not administered.
There is no evidence of biliary dilatation, abdominal aortic
aneurysm, or definite enlarged lymph nodes.
The visualized bowel is within normal limits.
IMPRESSION: Large left retroperitoneal hematoma/hemorrhage measuring 10.5 x
10.8 cm in greatest transverse dimension.

Small pericardial effusion.

Decreasing size of 9 mm right middle lobe nodule likely
inflammatory or infectious.

CT PELVIS
FINDINGS: A left retroperitoneal/pelvic hematoma/hemorrhage is
identified extending from the abdomen.
A small amount of free fluid in the pelvis is noted.
The patient is status post hysterectomy.
The visualized bowel and appendix are unremarkable.
The bladder is within normal limits.

No acute or suspicious bony abnormalities are identified.
Compression fracture of the L2 superior endplate is unchanged.
IMPRESSION: Left retroperitoneal/pelvic hematoma/hemorrhage with small amount
of free pelvic fluid.

## 2009-10-26 IMAGING — CR DG CHEST 1V PORT
1 series · 1 of 1 positions shown · non-contrast
Comparison: 09/12/2009

CLINICAL DATA: Status post CPR.

PORTABLE CHEST - 1 VIEW

[view not recorded]
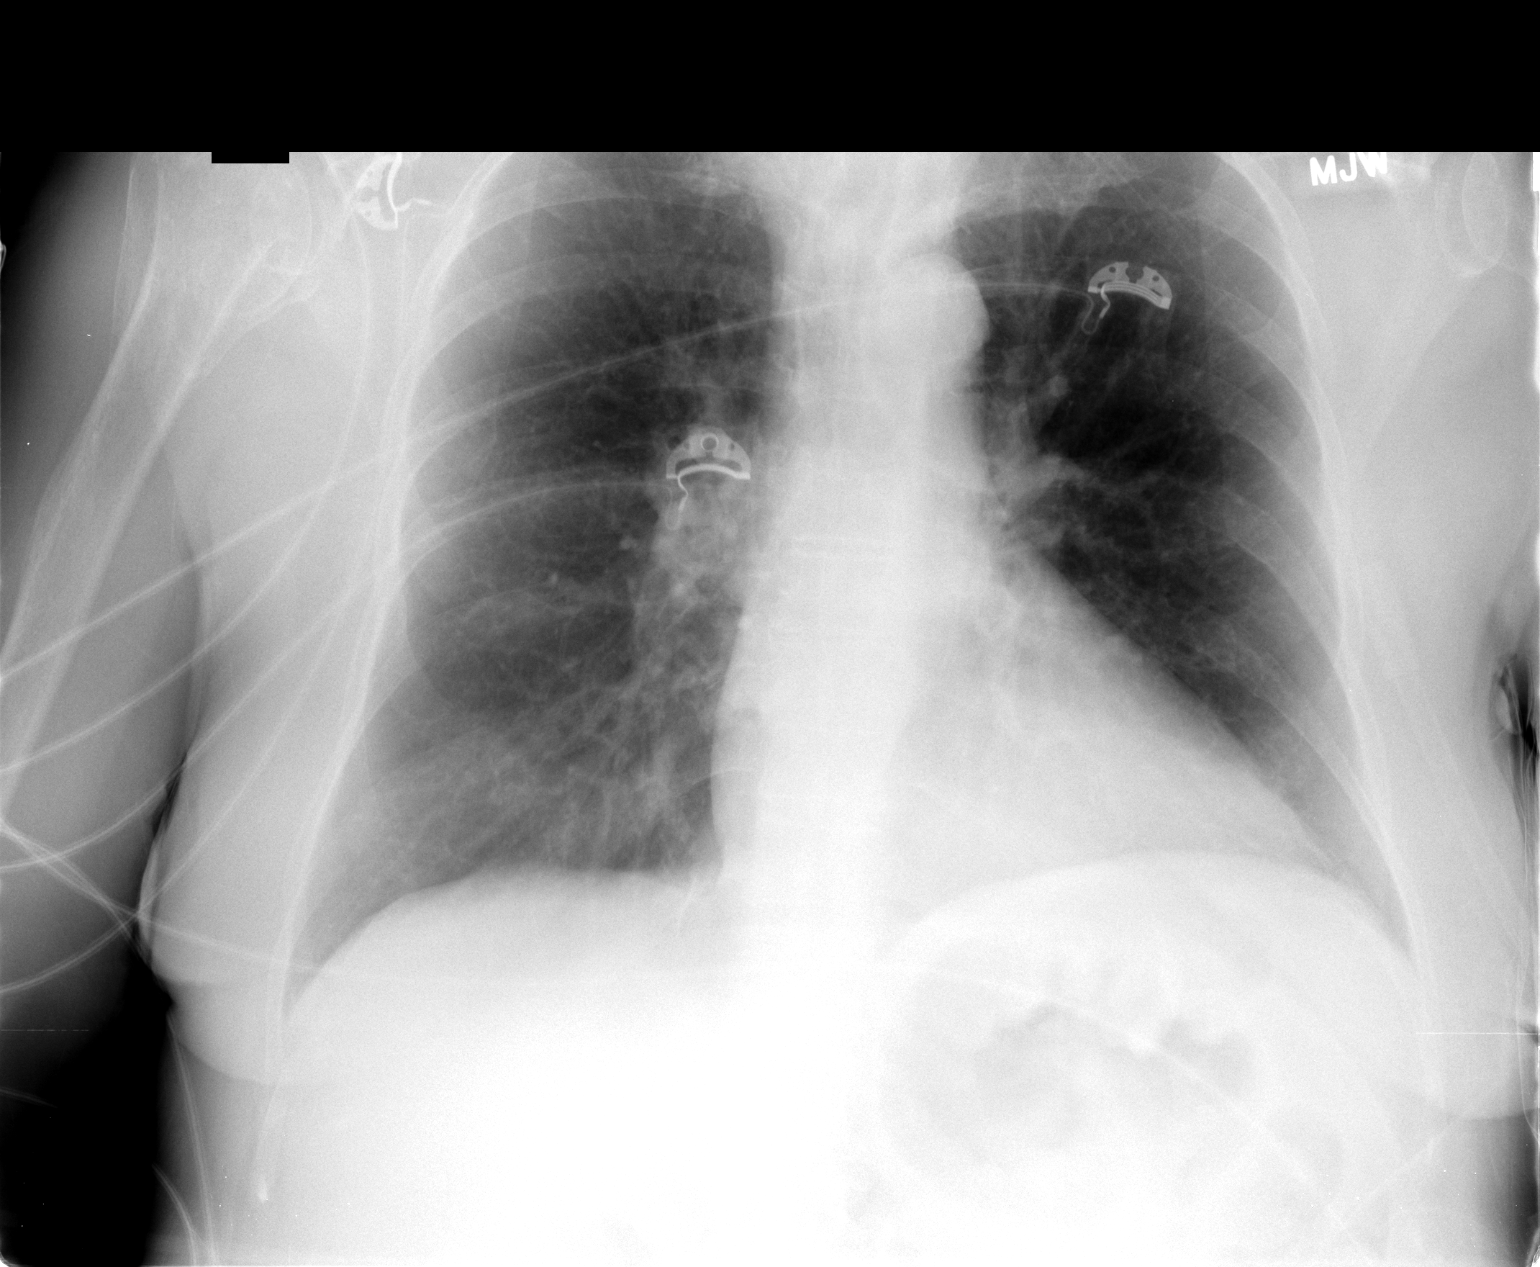

[1 of 1 positions shown; findings below may reference images not displayed]

FINDINGS: Heart size is normal.

No pleural effusion or pulmonary edema.

No airspace disease is identified.

Chronic fracture  involves the proximal right humerus.
IMPRESSION: 1.  No acute cardiopulmonary abnormalities.

## 2009-10-26 IMAGING — XA IR IVC FILTER PLACEMENT
1 series · 10 of 10 positions shown · IV contrast (IODINE)
Comparison: none

CLINICAL DATA: Bilateral acute pulmonary embolism and development
of large left retroperitoneal hemorrhage on anticoagulation.  The
patient requires an IVC filter.

[Series 300: sp ivc filter plc · 10 of 10 slices shown]
[im 1/10]
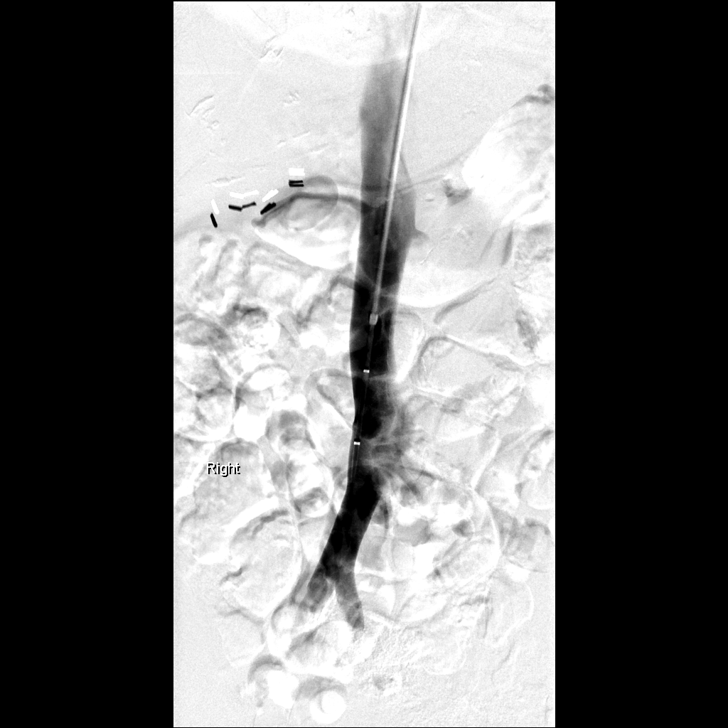
[im 2/10]
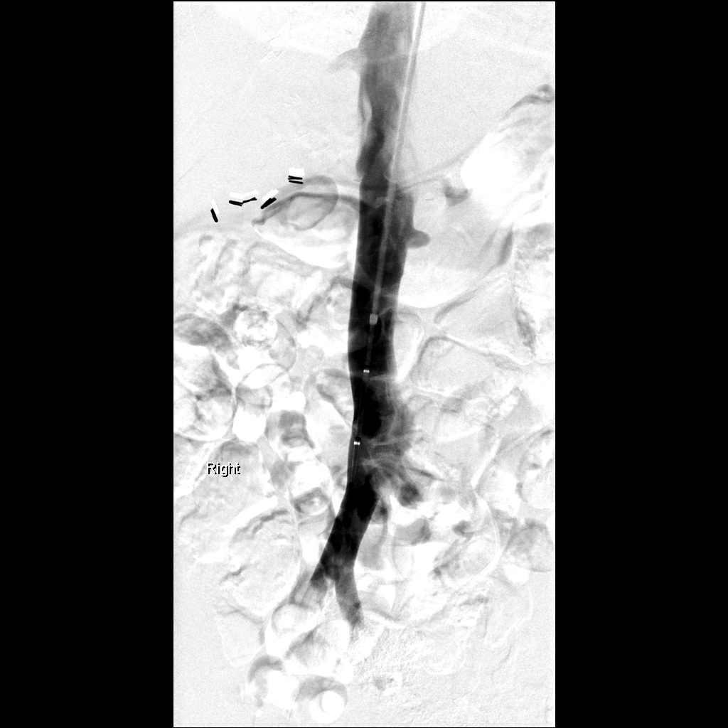
[im 3/10]
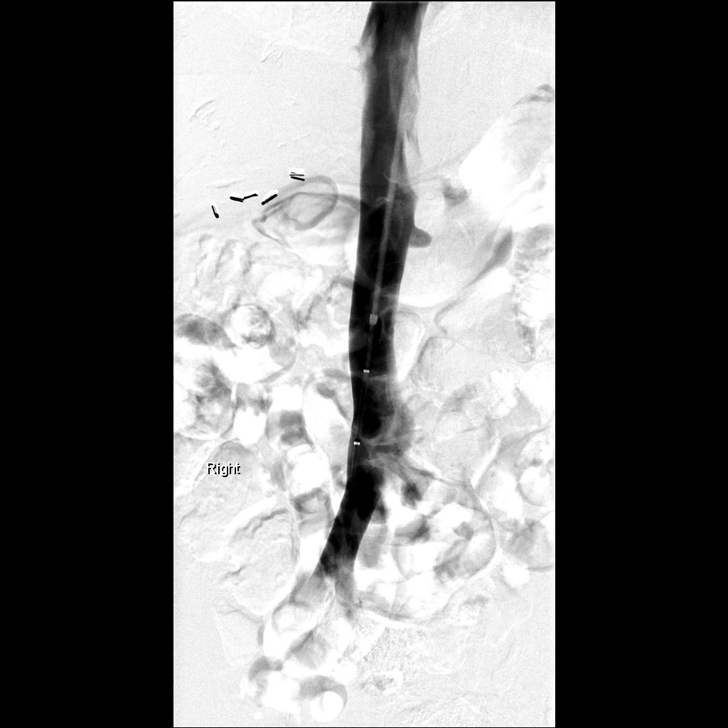
[im 4/10]
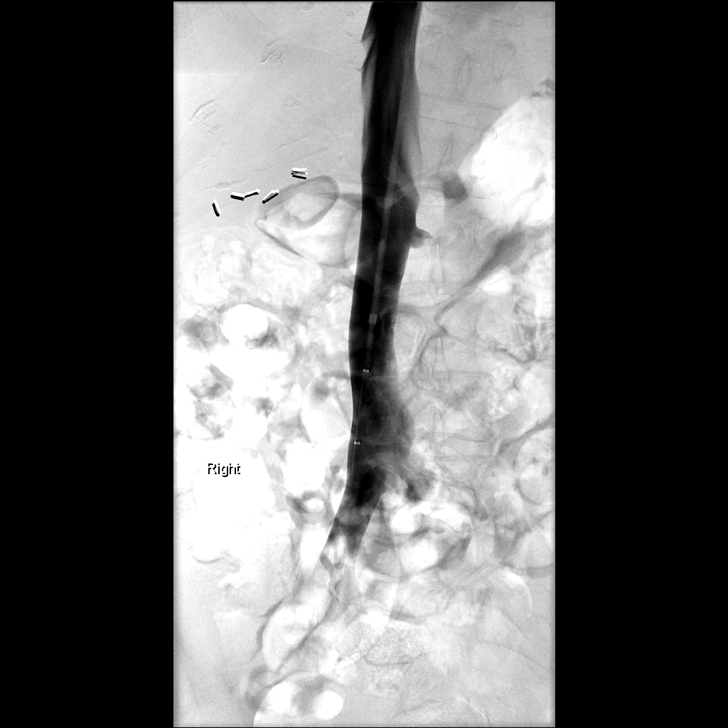
[im 5/10]
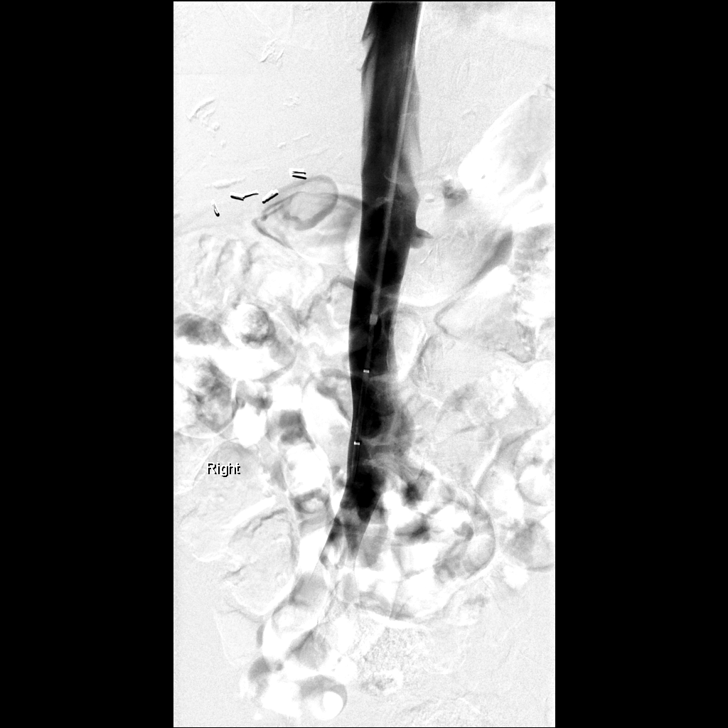
[im 6/10]
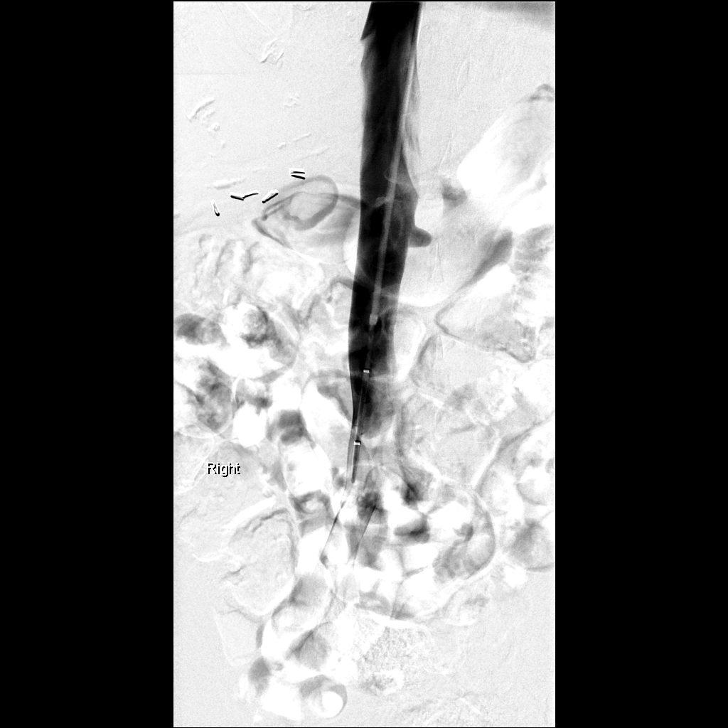
[im 7/10]
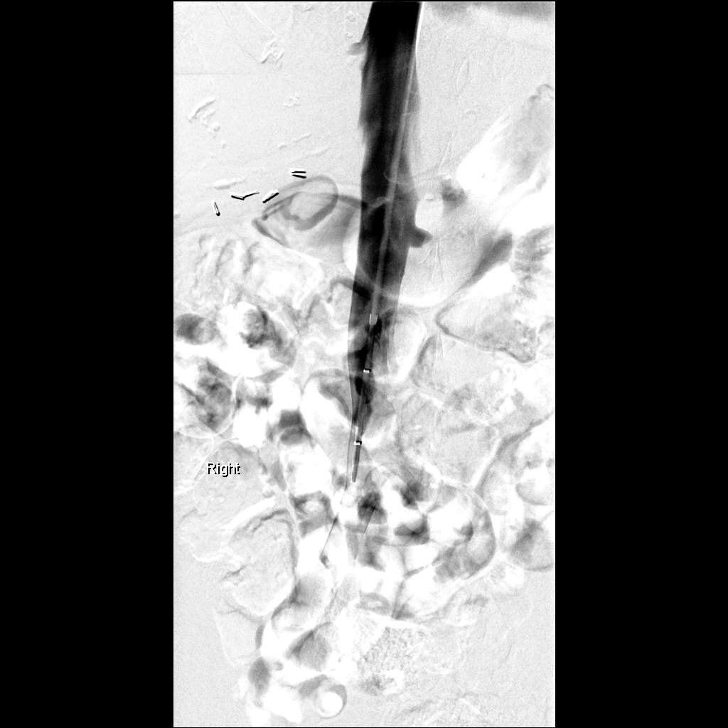
[im 8/10]
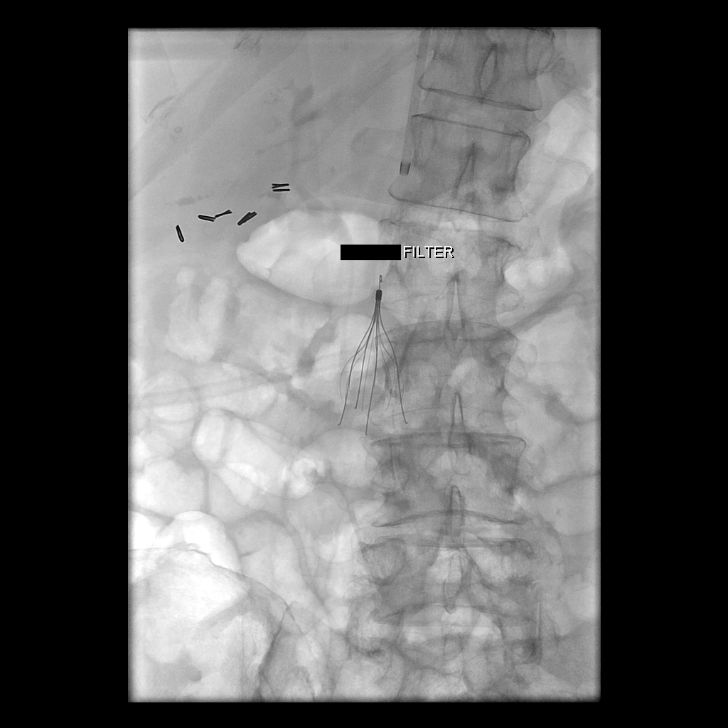
[im 9/10]
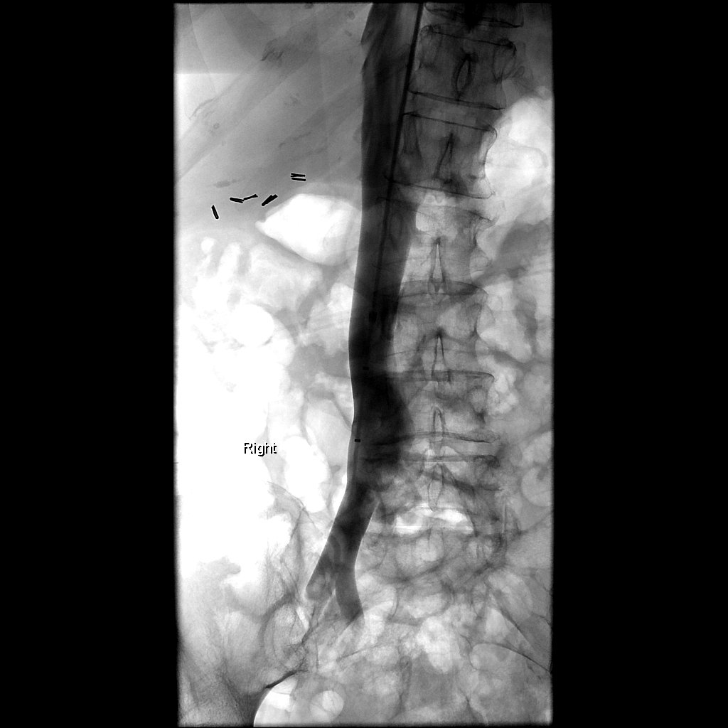
[im 10/10]
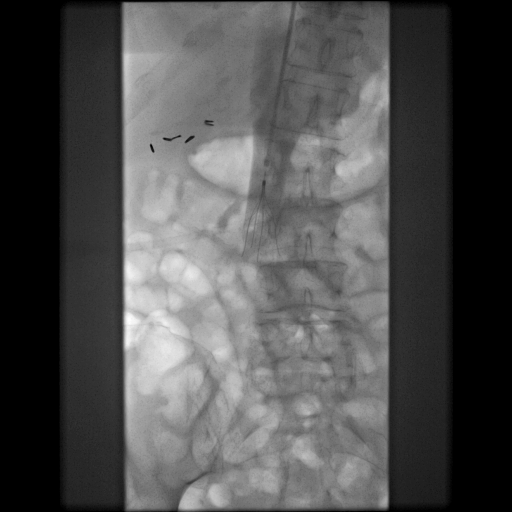

[10 of 10 positions shown; findings below may reference images not displayed]

1.  ULTRASOUND GUIDANCE FOR VASCULAR ACCESS OF THE RIGHT INTERNAL
JUGULAR VEIN.
2.  IVC VENOGRAM
3.  PERCUTANEOUS IVC FILTER PLACEMENT

Sedation:  2.0 mg IV Versed; 100 mcg IV Fentanyl.

Total Moderate Sedation Time:  20 minutes.

Contrast:  30 ml Hmnipaque-V33

Fluoroscopy Time: 1.4 minutes.

Procedure:  The procedure, risks, benefits, and alternatives were
explained to the patient.  Questions regarding the procedure were
encouraged and answered.  The patient understands and consents to
the procedure.

The right neck was prepped with Betadine in a sterile fashion, and
a sterile drape was applied covering the operative field.  A
sterile gown and sterile gloves were used for the procedure.  Local
anesthesia was provided with 1% Lidocaine.

Under direct ultrasound guidance, a 21 gauge needle was advanced
into the right internal jugular vein with ultrasound image
documentation performed.  After securing access with a
micropuncture dilator, a guidewire was advanced into the inferior
vena cava.  A deployment sheath was advanced over the guidewire.
This was utilized to perform IVC venography.

The deployment sheath was further positioned in an appropriate
location for filter deployment.  Nazareth Jumper IVC filter was then
advanced in the sheath.  This was then fully deployed in the
infrarenal IVC.  Final filter position was confirmed with a
fluoroscopic spot image.  Contrast injection was also performed
through the sheath under fluoroscopy to confirm patency of the IVC
at the level of the filter.  After the procedure the sheath was
removed and hemostasis obtained with manual compression.

Complications: None
FINDINGS: IVC venography demonstrates a normal caliber IVC with no
evidence of thrombus.  Renal veins are identified bilaterally.  The
IVC filter was successfully positioned below the level of the renal
veins and is appropriately oriented.  This IVC filter has both
permanent and retrievable indications.
IMPRESSION: Placement of percutaneous IVC filter in infrarenal IVC.  IVC
venogram shows no evidence of IVC thrombus and normal caliber of
the inferior vena cava.  This filter does have both permanent and
retrievable indications.

## 2009-11-10 ENCOUNTER — Encounter: Payer: Self-pay | Admitting: Family Medicine

## 2009-11-10 ENCOUNTER — Ambulatory Visit: Payer: Self-pay | Admitting: Family Medicine

## 2009-11-10 DIAGNOSIS — D37039 Neoplasm of uncertain behavior of the major salivary glands, unspecified: Secondary | ICD-10-CM | POA: Insufficient documentation

## 2009-11-10 LAB — CONVERTED CEMR LAB
BUN: 6 mg/dL (ref 6–23)
Calcium: 8.9 mg/dL (ref 8.4–10.5)
Creatinine, Ser: 0.44 mg/dL (ref 0.40–1.20)
Glucose, Bld: 99 mg/dL (ref 70–99)

## 2010-01-05 ENCOUNTER — Encounter: Payer: Self-pay | Admitting: Family Medicine

## 2010-01-12 ENCOUNTER — Ambulatory Visit: Payer: Self-pay | Admitting: Family Medicine

## 2010-01-12 ENCOUNTER — Encounter: Payer: Self-pay | Admitting: Family Medicine

## 2010-01-22 ENCOUNTER — Ambulatory Visit: Payer: Self-pay | Admitting: Pulmonary Disease

## 2010-01-22 ENCOUNTER — Encounter (INDEPENDENT_AMBULATORY_CARE_PROVIDER_SITE_OTHER): Payer: Self-pay | Admitting: *Deleted

## 2010-01-30 ENCOUNTER — Encounter: Payer: Self-pay | Admitting: Family Medicine

## 2010-02-09 ENCOUNTER — Telehealth: Payer: Self-pay | Admitting: Family Medicine

## 2010-02-17 ENCOUNTER — Encounter: Payer: Self-pay | Admitting: Family Medicine

## 2010-03-04 ENCOUNTER — Ambulatory Visit (HOSPITAL_COMMUNITY): Admission: RE | Admit: 2010-03-04 | Discharge: 2010-03-04 | Payer: Self-pay | Admitting: Family Medicine

## 2010-03-04 ENCOUNTER — Encounter: Payer: Self-pay | Admitting: Family Medicine

## 2010-03-04 ENCOUNTER — Ambulatory Visit: Payer: Self-pay | Admitting: Cardiology

## 2010-03-09 ENCOUNTER — Ambulatory Visit (HOSPITAL_COMMUNITY): Admission: RE | Admit: 2010-03-09 | Discharge: 2010-03-09 | Payer: Self-pay | Admitting: Family Medicine

## 2010-03-16 ENCOUNTER — Encounter: Payer: Self-pay | Admitting: Family Medicine

## 2010-04-11 IMAGING — CR DG CHEST 2V
1 series · 1 of 1 positions shown · non-contrast
Comparison: September 23, 2009

CLINICAL DATA: Pulmonary embolus

CHEST - 2 VIEW

[view not recorded]
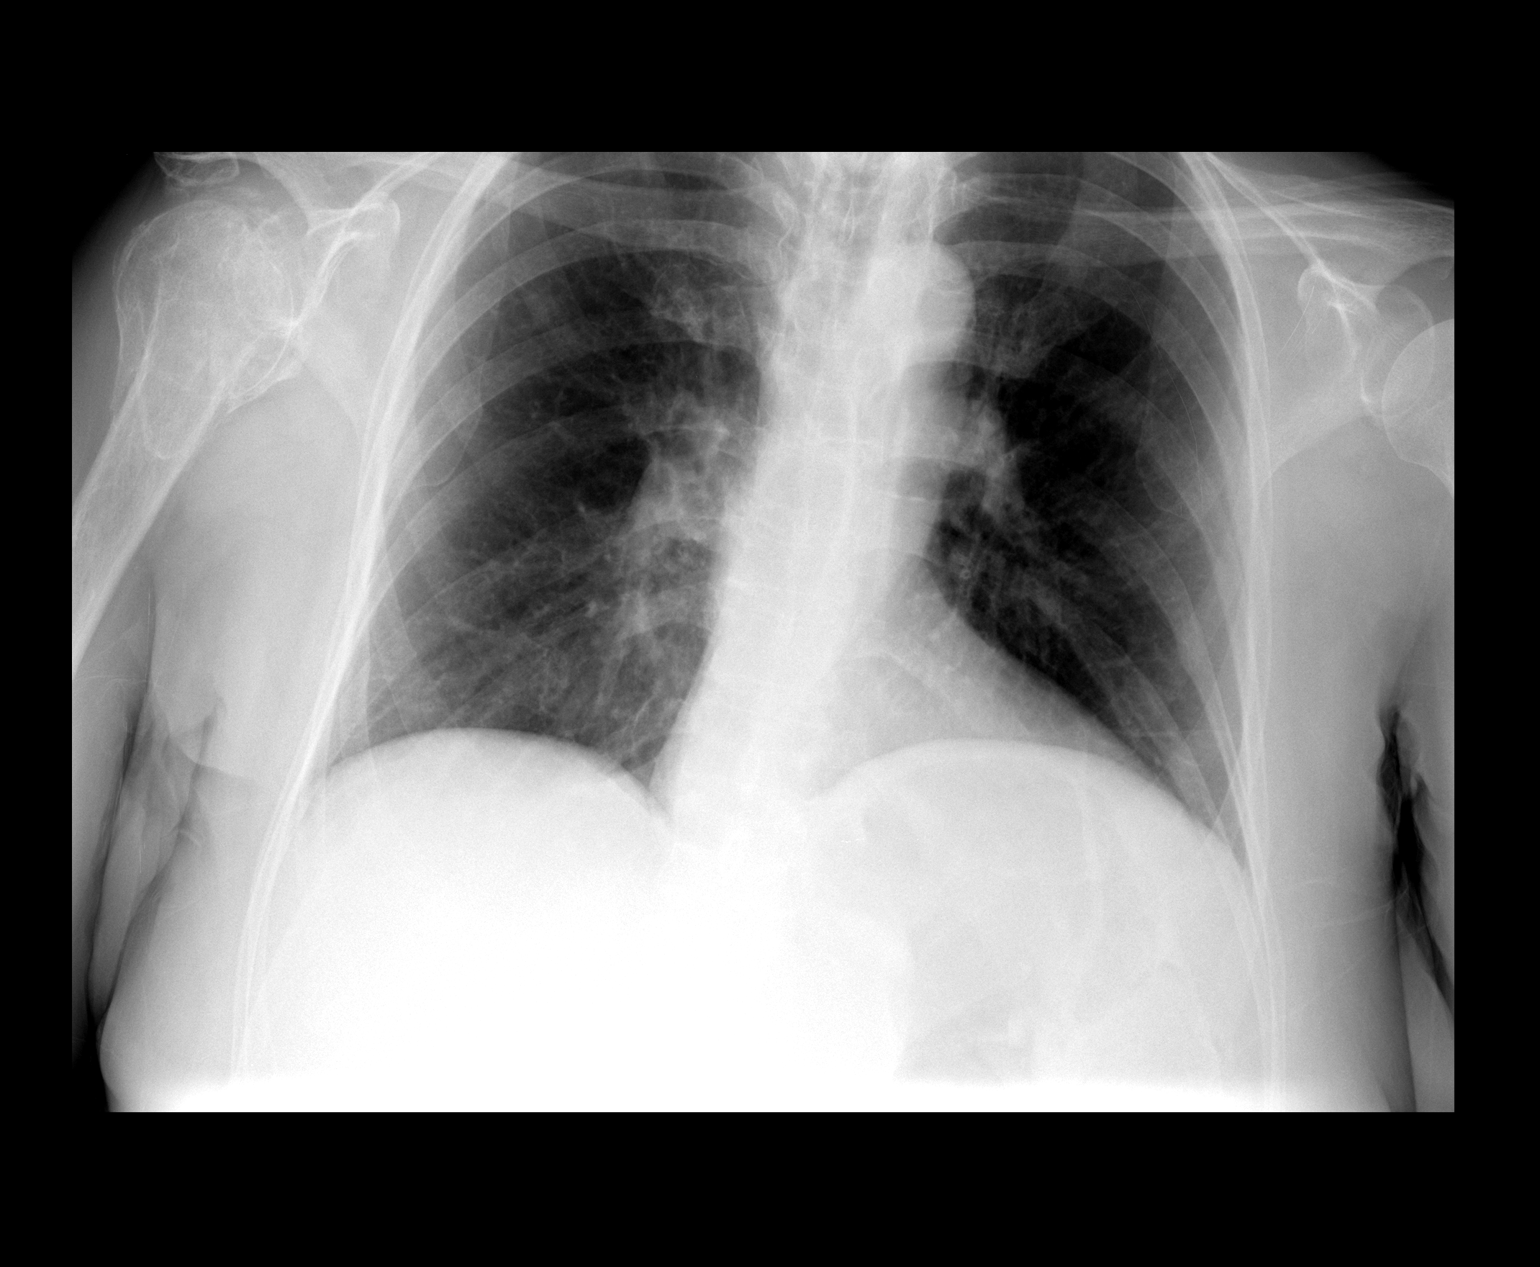

[1 of 1 positions shown; findings below may reference images not displayed]

FINDINGS: The cardiac silhouette, mediastinum, pulmonary
vasculature are within normal limits.  Both lungs are clear.  There
is osteopenia and marked degenerative change within the the mid
thoracic spine and right shoulder.
IMPRESSION: Stable , negative chest x-ray.

## 2010-04-11 IMAGING — NM NM PULM PERFUSION & VENT (REBREATHING & WASHOUT)
1 series · 12 of 12 positions shown · non-contrast
Comparison: Chest x-ray 03/09/2010.  Chest CT 09/12/2009

CLINICAL DATA: Pulmonary emboli.  Evaluate for progression.

NUCLEAR MEDICINE VENTILATION - PERFUSION LUNG SCAN
TECHNIQUE: Wash-in, equilibrium, and wash-out phase ventilation
images were obtained using 0e-O44 gas.  Perfusion images were
obtained in multiple projections after intravenous injection of Tc-
99m MAA.
Radiopharmaceuticals:  10 mCi 0e-O44 gas and 6 mCi Lc-HHm MAA.

[Series 0: vq scan · 2.52mm/px · 2 acquisitions, 12 frames shown]
[im 1/2  full-range]
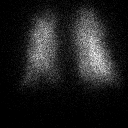
[im 1/2  full-range]
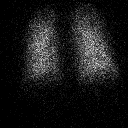
[im 1/2  full-range]
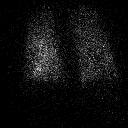
[im 1/2  full-range]
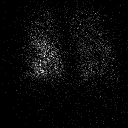
[im 1/2  full-range]
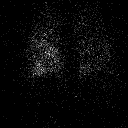
[im 1/2  full-range]
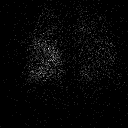
[im 2/2  full-range]
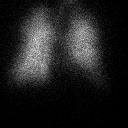
[im 2/2  full-range]
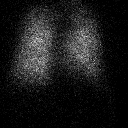
[im 2/2  full-range]
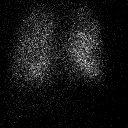
[im 2/2  full-range]
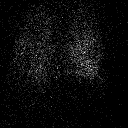
[im 2/2  full-range]
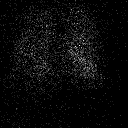
[im 2/2  full-range]
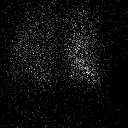

[12 of 12 positions shown; findings below may reference images not displayed]

FINDINGS: Both ventilation and perfusion portions of the study
demonstrate patchy nonsegmental defects.  This is most likely
related to obstructive lung disease.  No segmental VQ mismatches
are seen to suggest pulmonary emboli.
IMPRESSION: Low probability study for pulmonary embolus.

## 2010-05-25 ENCOUNTER — Encounter: Payer: Self-pay | Admitting: Family Medicine

## 2010-09-23 ENCOUNTER — Encounter: Payer: Self-pay | Admitting: Family Medicine

## 2010-09-23 ENCOUNTER — Inpatient Hospital Stay (HOSPITAL_COMMUNITY): Admission: EM | Admit: 2010-09-23 | Discharge: 2010-09-25 | Payer: Self-pay | Admitting: Family Medicine

## 2010-09-23 ENCOUNTER — Encounter: Payer: Self-pay | Admitting: Emergency Medicine

## 2010-09-23 ENCOUNTER — Ambulatory Visit: Payer: Self-pay | Admitting: Family Medicine

## 2010-09-30 ENCOUNTER — Ambulatory Visit: Payer: Self-pay | Admitting: Family Medicine

## 2010-10-12 ENCOUNTER — Telehealth: Payer: Self-pay | Admitting: *Deleted

## 2010-10-15 ENCOUNTER — Telehealth: Payer: Self-pay | Admitting: Family Medicine

## 2010-10-26 IMAGING — CR DG ABDOMEN ACUTE W/ 1V CHEST
3 series · 3 of 3 positions shown · non-contrast
Comparison: None.

CLINICAL DATA: Constipation and pain

ACUTE ABDOMEN SERIES (ABDOMEN 2 VIEW & CHEST 1 VIEW)

[w abdomen decub]
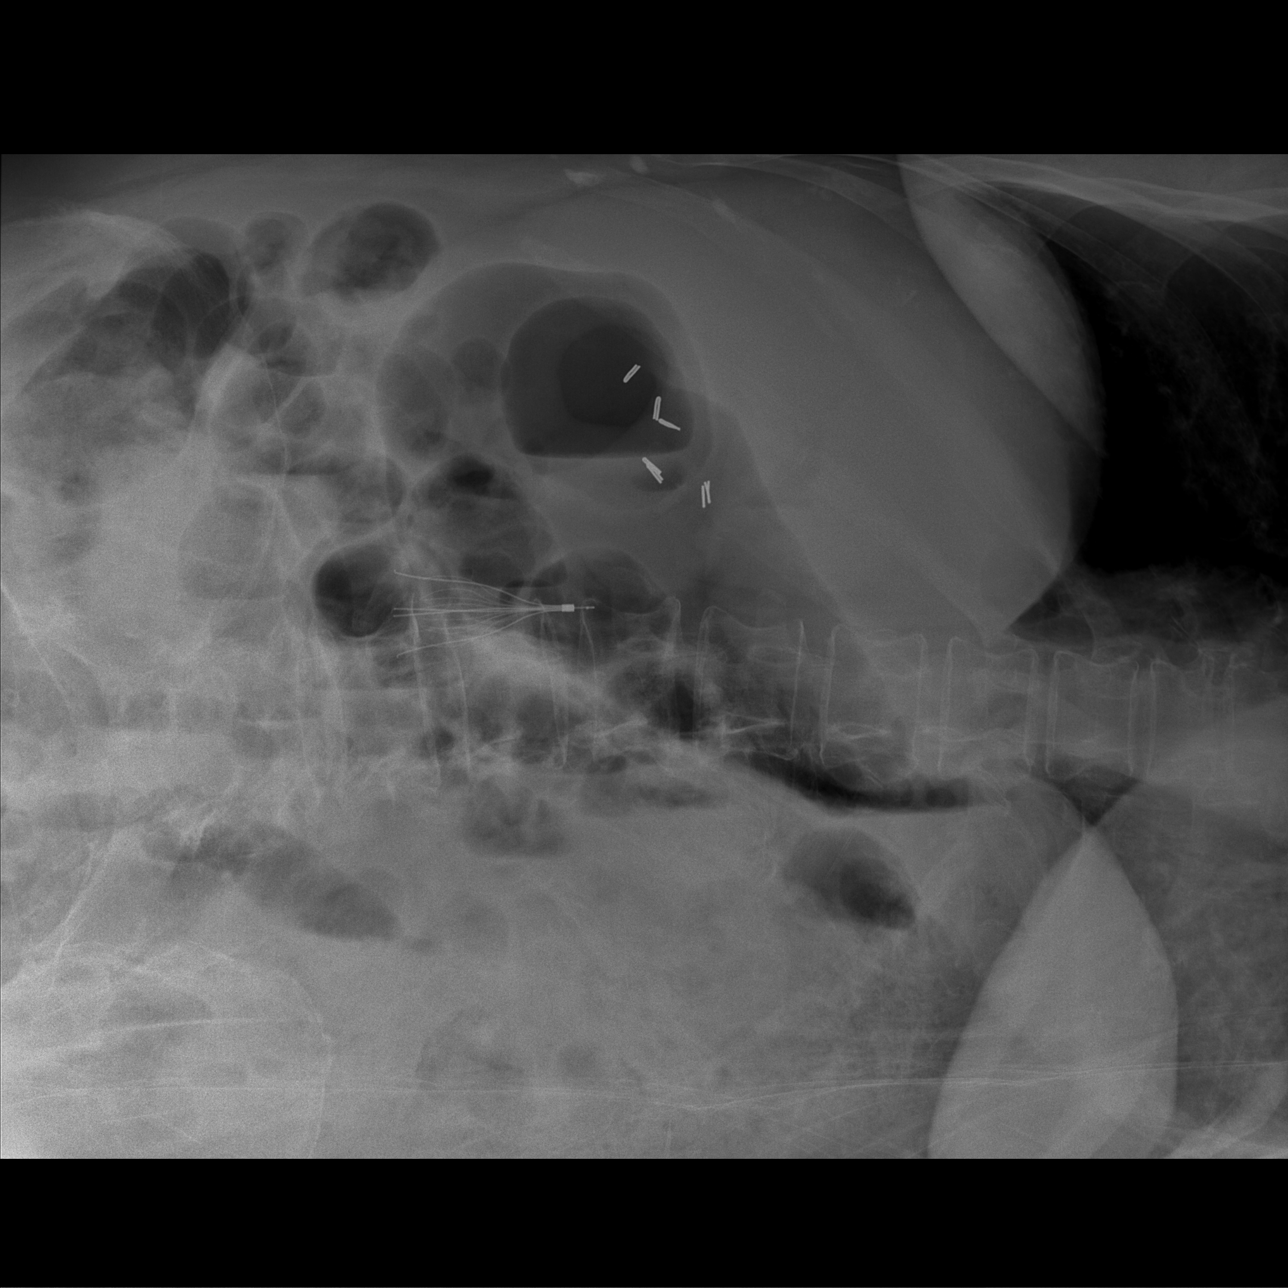

[view not recorded (1 of 2)]
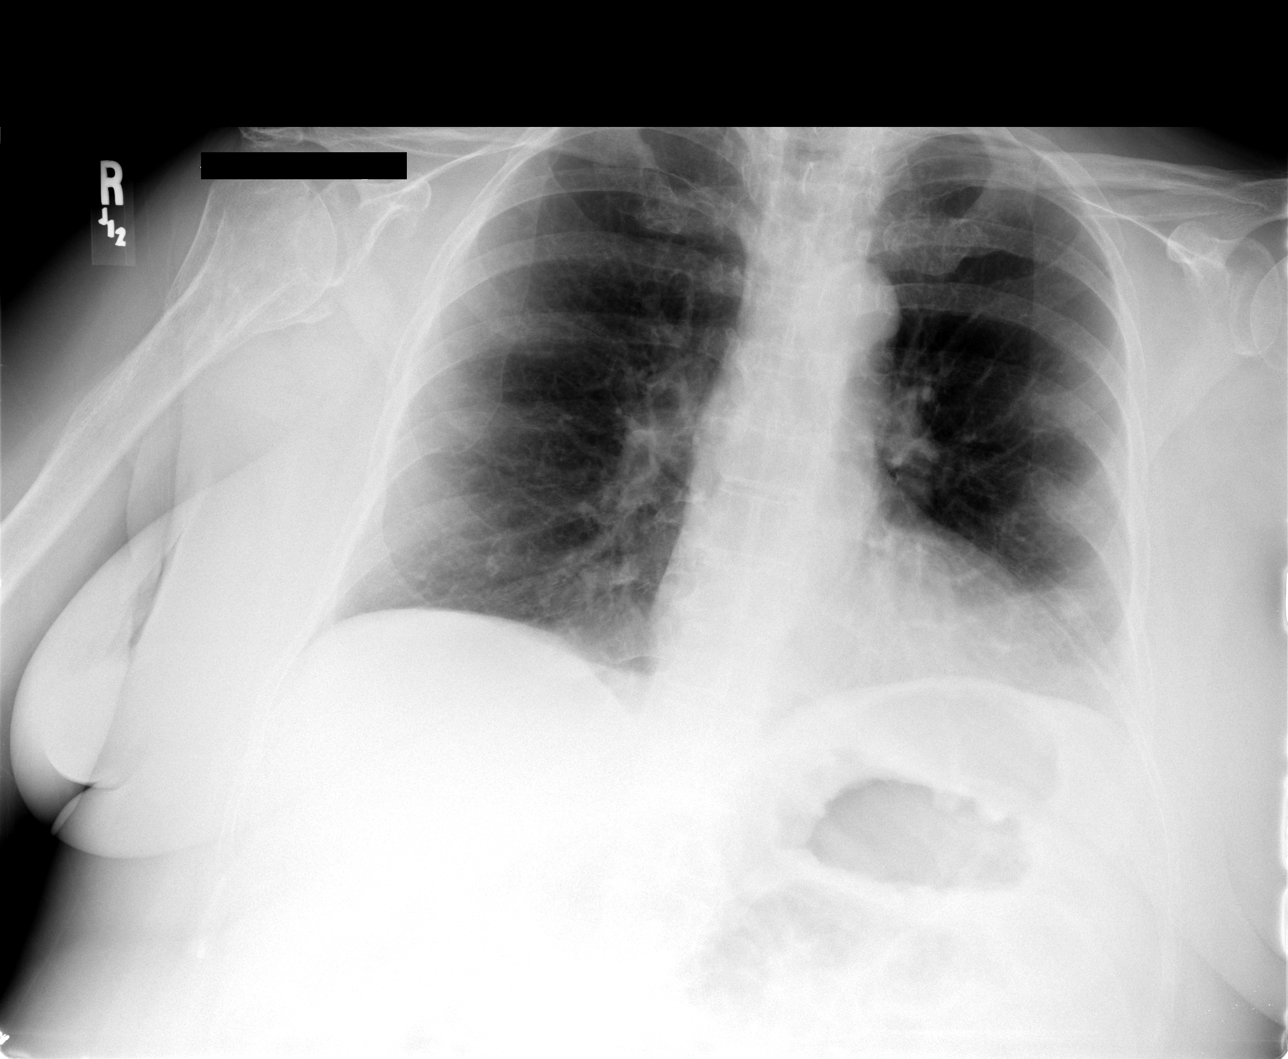

[view not recorded (2 of 2)]
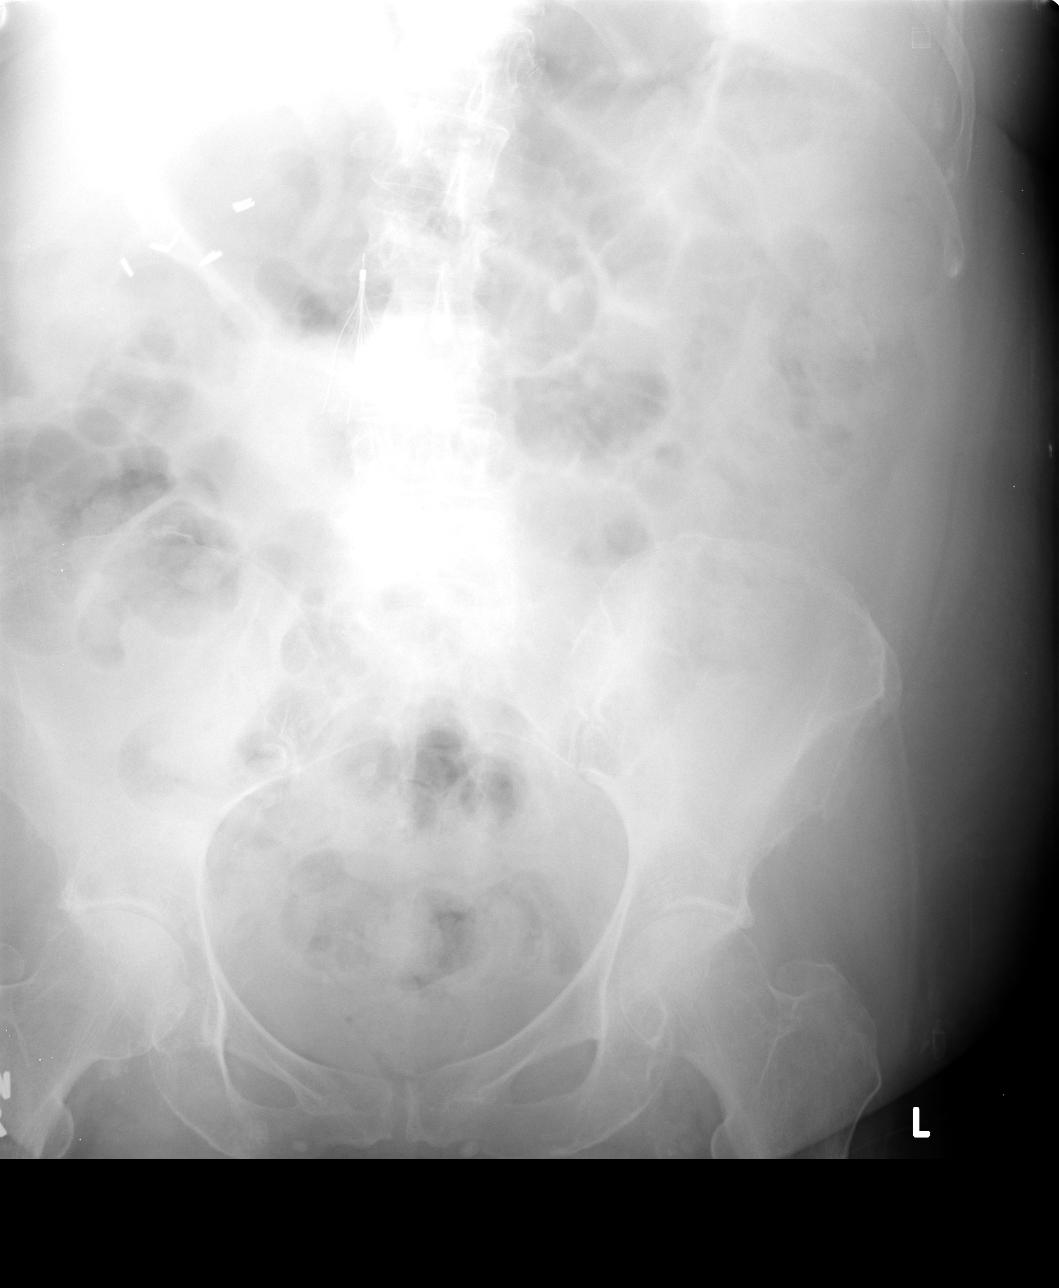

[3 of 3 positions shown; findings below may reference images not displayed]

FINDINGS: There is no intra-abdominal free air seen.  There are
prompt loops of small bowel seen in the midportion of the abdomen
measuring up to 3.5 cm.  There is gaseous distension seen in the
stomach.  There is some gas and stool seen in the colon.  The bones
are osteopenic and there is a compression fracture of the L2
vertebral body.  An IVC filter is seen projecting to the right of
the L3 vertebral body.
IMPRESSION: Findings concerning for early/developing small bowel obstruction.
Incidental findings detailed above.

## 2010-10-26 IMAGING — CR DG ABD PORTABLE 1V
1 series · 1 of 1 positions shown · non-contrast
Comparison: 09/23/2010

CLINICAL DATA: Nasogastric tube placement.

ABDOMEN - 1 VIEW

[series [date]]
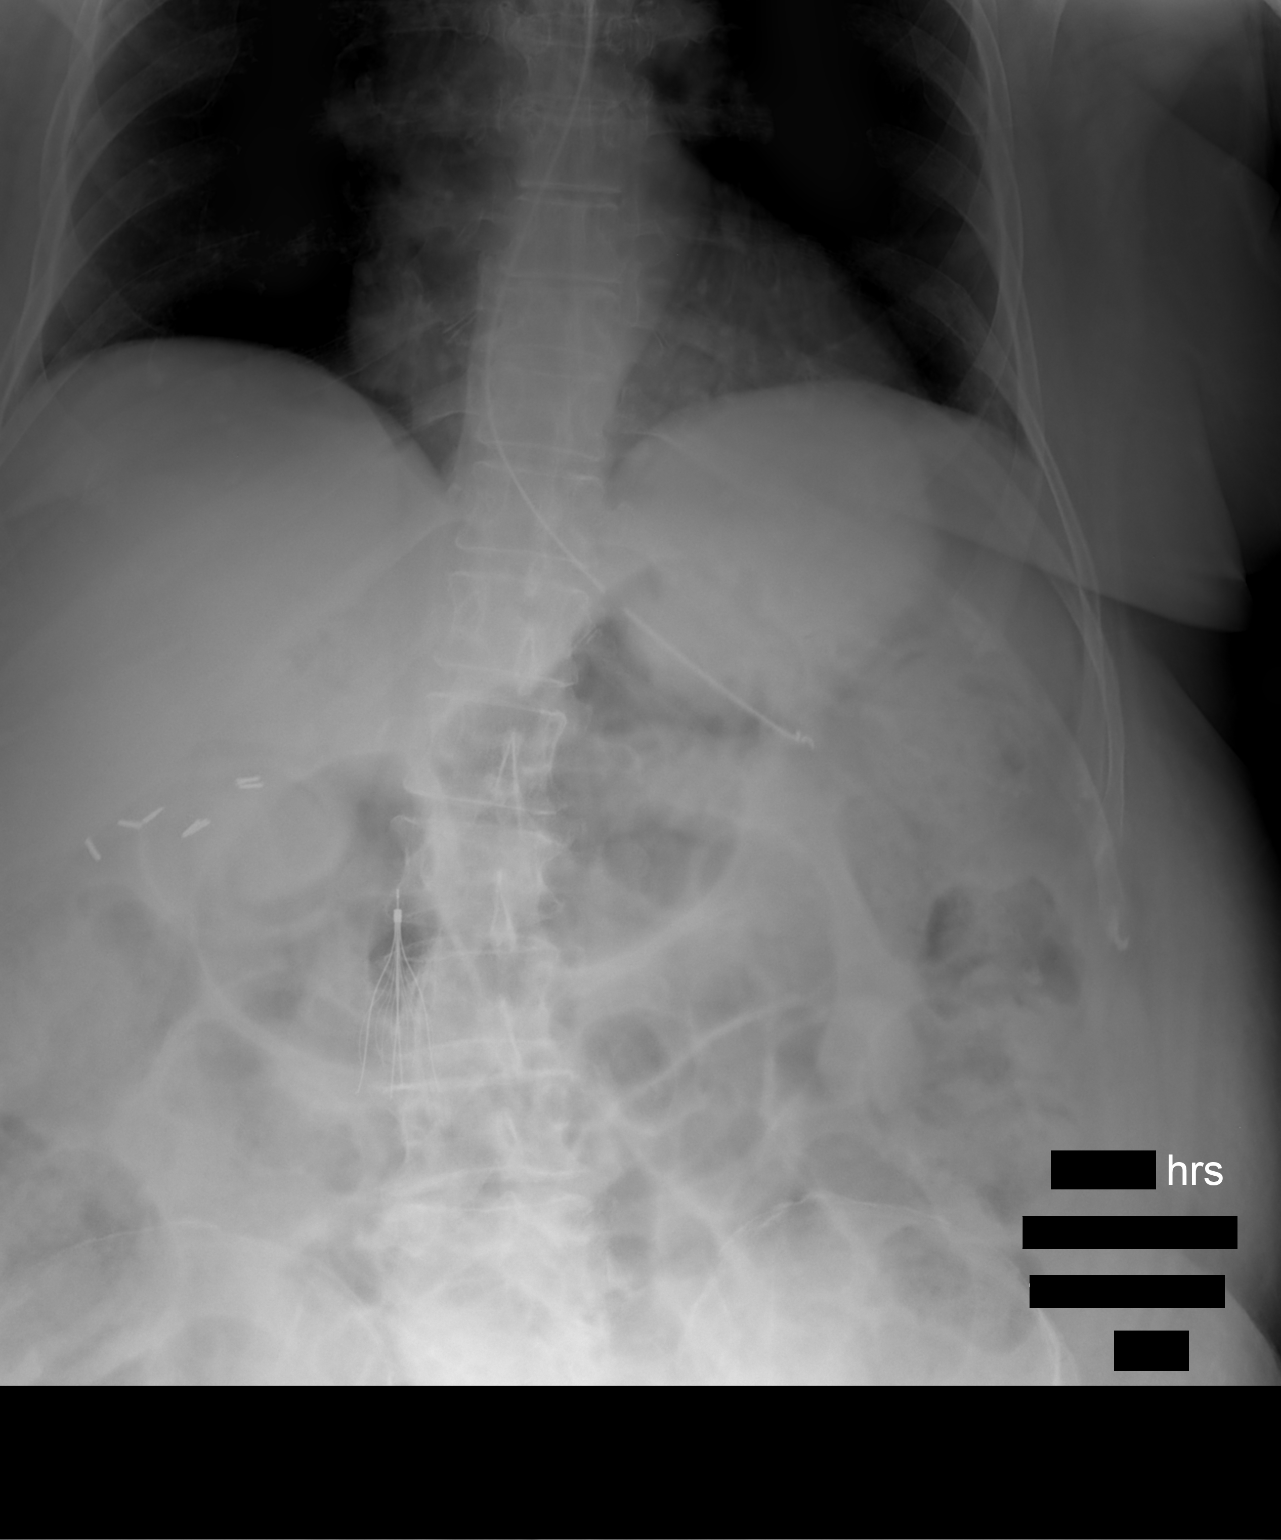

[1 of 1 positions shown; findings below may reference images not displayed]

FINDINGS: Nasogastric tube terminates in the stomach.  The side
port is at or just beyond the gastroesophageal junction.  There are
dilated loops of small bowel and colon in the visualized portion of
the abdomen.  IVC filter is noted.
IMPRESSION: 1.  Nasogastric tube terminates in the stomach.
2.  Gaseous distention of bowel.

## 2010-10-26 IMAGING — CR DG CHEST 1V PORT
1 series · 1 of 1 positions shown · non-contrast
Comparison: 03/09/2010

Addendum Begins

There is a typo in the second sentence of the findings paragraph.
There is no pulmonary consolidation identified.
Addendum Ends
CLINICAL DATA: Constipation.  Nasogastric tube placement
PORTABLE CHEST - 1 VIEW

[series 1]
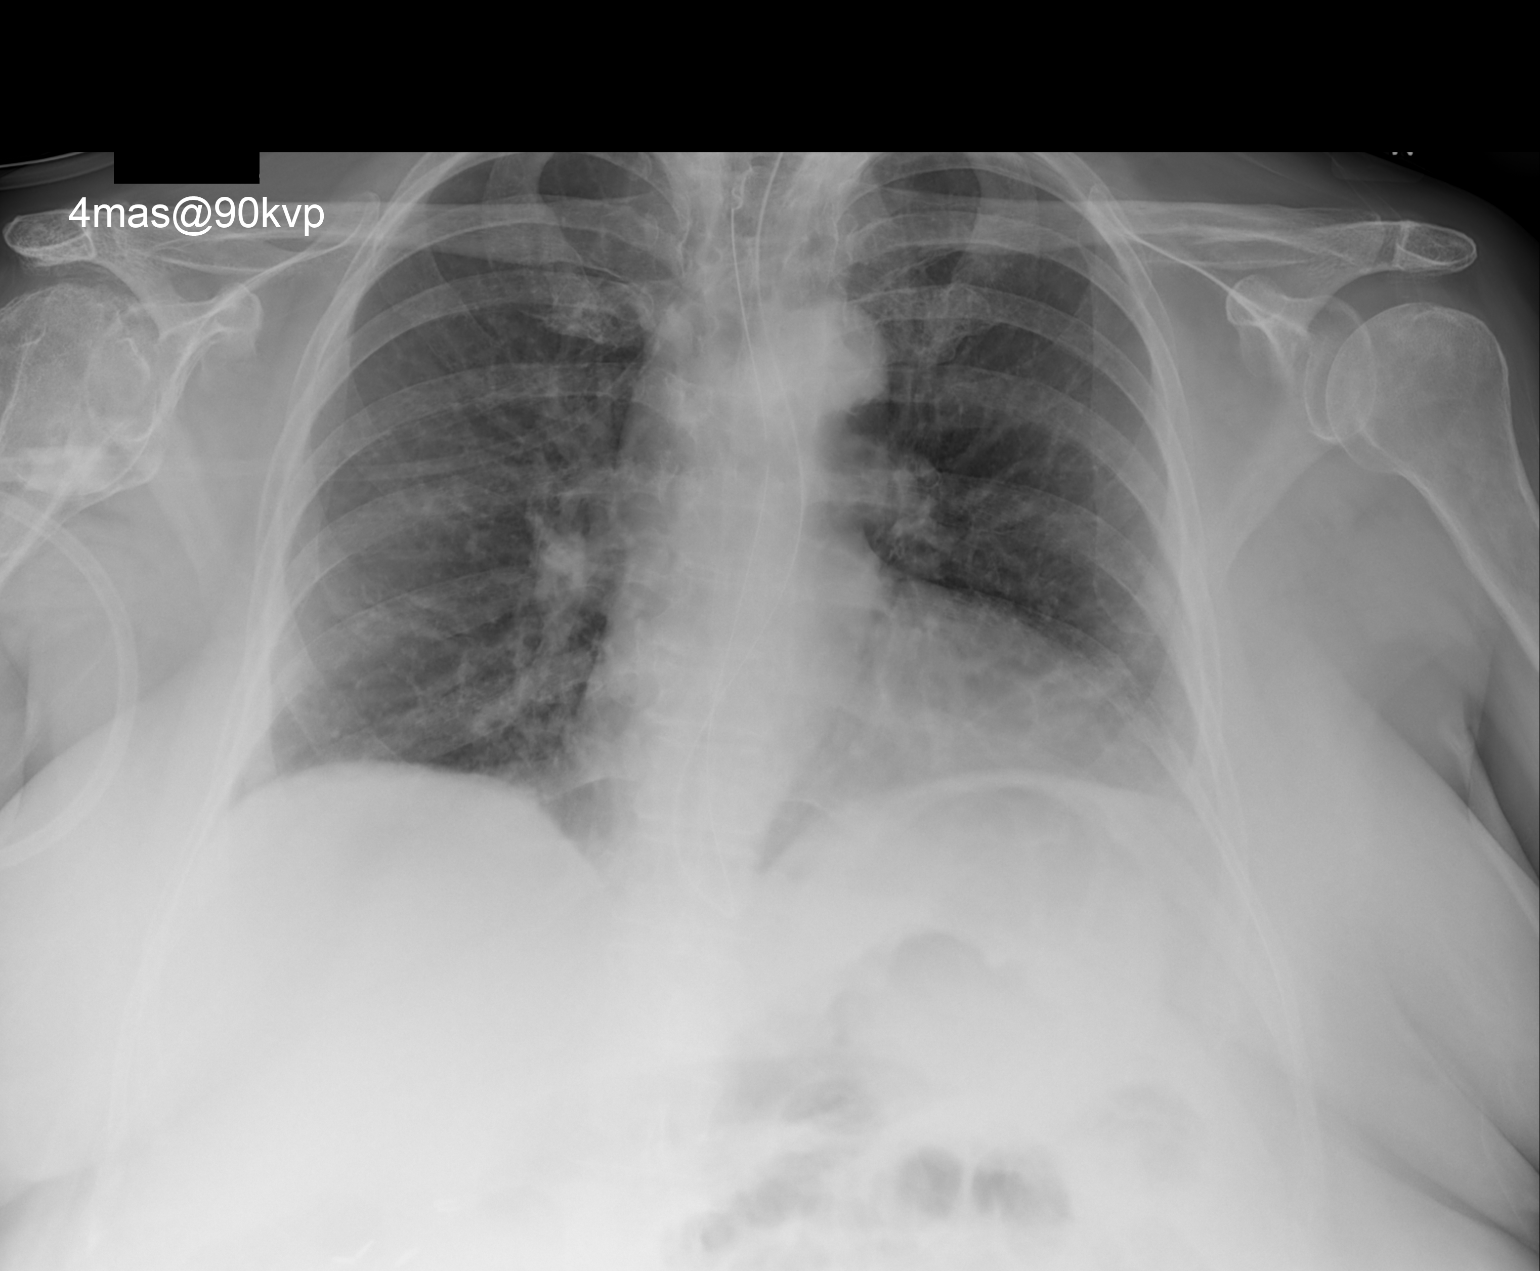

[1 of 1 positions shown; findings below may reference images not displayed]

FINDINGS: The lung volumes are low and there is chronic prominence
of the interstitial markings.  There is a consolidation are seen.
The heart is mildly enlarged.  A nasogastric tube is in place with
the tip coiled upon itself with the tip in the proximal esophagus.
Severe degenerative changes are seen in the right humeral head.,
small bowel loops are partially seen in the upper abdomen.
IMPRESSION: Nasogastric tube coiled upon itself in the esophagus.

## 2010-11-04 ENCOUNTER — Telehealth: Payer: Self-pay | Admitting: Family Medicine

## 2010-11-17 ENCOUNTER — Ambulatory Visit: Payer: Self-pay | Admitting: Family Medicine

## 2010-11-17 DIAGNOSIS — G4700 Insomnia, unspecified: Secondary | ICD-10-CM | POA: Insufficient documentation

## 2010-11-17 DIAGNOSIS — R609 Edema, unspecified: Secondary | ICD-10-CM

## 2010-11-17 DIAGNOSIS — G8929 Other chronic pain: Secondary | ICD-10-CM

## 2010-11-17 DIAGNOSIS — Z95828 Presence of other vascular implants and grafts: Secondary | ICD-10-CM

## 2010-11-17 LAB — CONVERTED CEMR LAB: Hgb A1c MFr Bld: 6.2 %

## 2010-11-23 ENCOUNTER — Telehealth: Payer: Self-pay | Admitting: Family Medicine

## 2010-11-23 ENCOUNTER — Ambulatory Visit
Admission: RE | Admit: 2010-11-23 | Discharge: 2010-11-23 | Payer: Self-pay | Source: Home / Self Care | Admitting: Family Medicine

## 2010-11-24 ENCOUNTER — Telehealth: Payer: Self-pay | Admitting: Family Medicine

## 2010-11-25 ENCOUNTER — Encounter: Payer: Self-pay | Admitting: Family Medicine

## 2010-11-25 ENCOUNTER — Emergency Department (HOSPITAL_COMMUNITY)
Admission: EM | Admit: 2010-11-25 | Discharge: 2010-11-25 | Payer: Self-pay | Source: Home / Self Care | Admitting: Family Medicine

## 2010-11-27 ENCOUNTER — Telehealth: Payer: Self-pay | Admitting: Family Medicine

## 2010-12-07 ENCOUNTER — Telehealth: Payer: Self-pay | Admitting: *Deleted

## 2010-12-10 ENCOUNTER — Telehealth: Payer: Self-pay | Admitting: Family Medicine

## 2010-12-17 ENCOUNTER — Emergency Department (HOSPITAL_COMMUNITY)
Admission: EM | Admit: 2010-12-17 | Discharge: 2010-12-17 | Payer: Self-pay | Source: Home / Self Care | Admitting: Emergency Medicine

## 2010-12-23 ENCOUNTER — Ambulatory Visit: Admit: 2010-12-23 | Payer: Self-pay | Admitting: Vascular Surgery

## 2010-12-28 IMAGING — CR DG CHEST 1V
1 series · 1 of 1 positions shown · non-contrast
Comparison: 09/23/2010

CLINICAL DATA: COPD, emphysema

CHEST - 1 VIEW

[view not recorded]
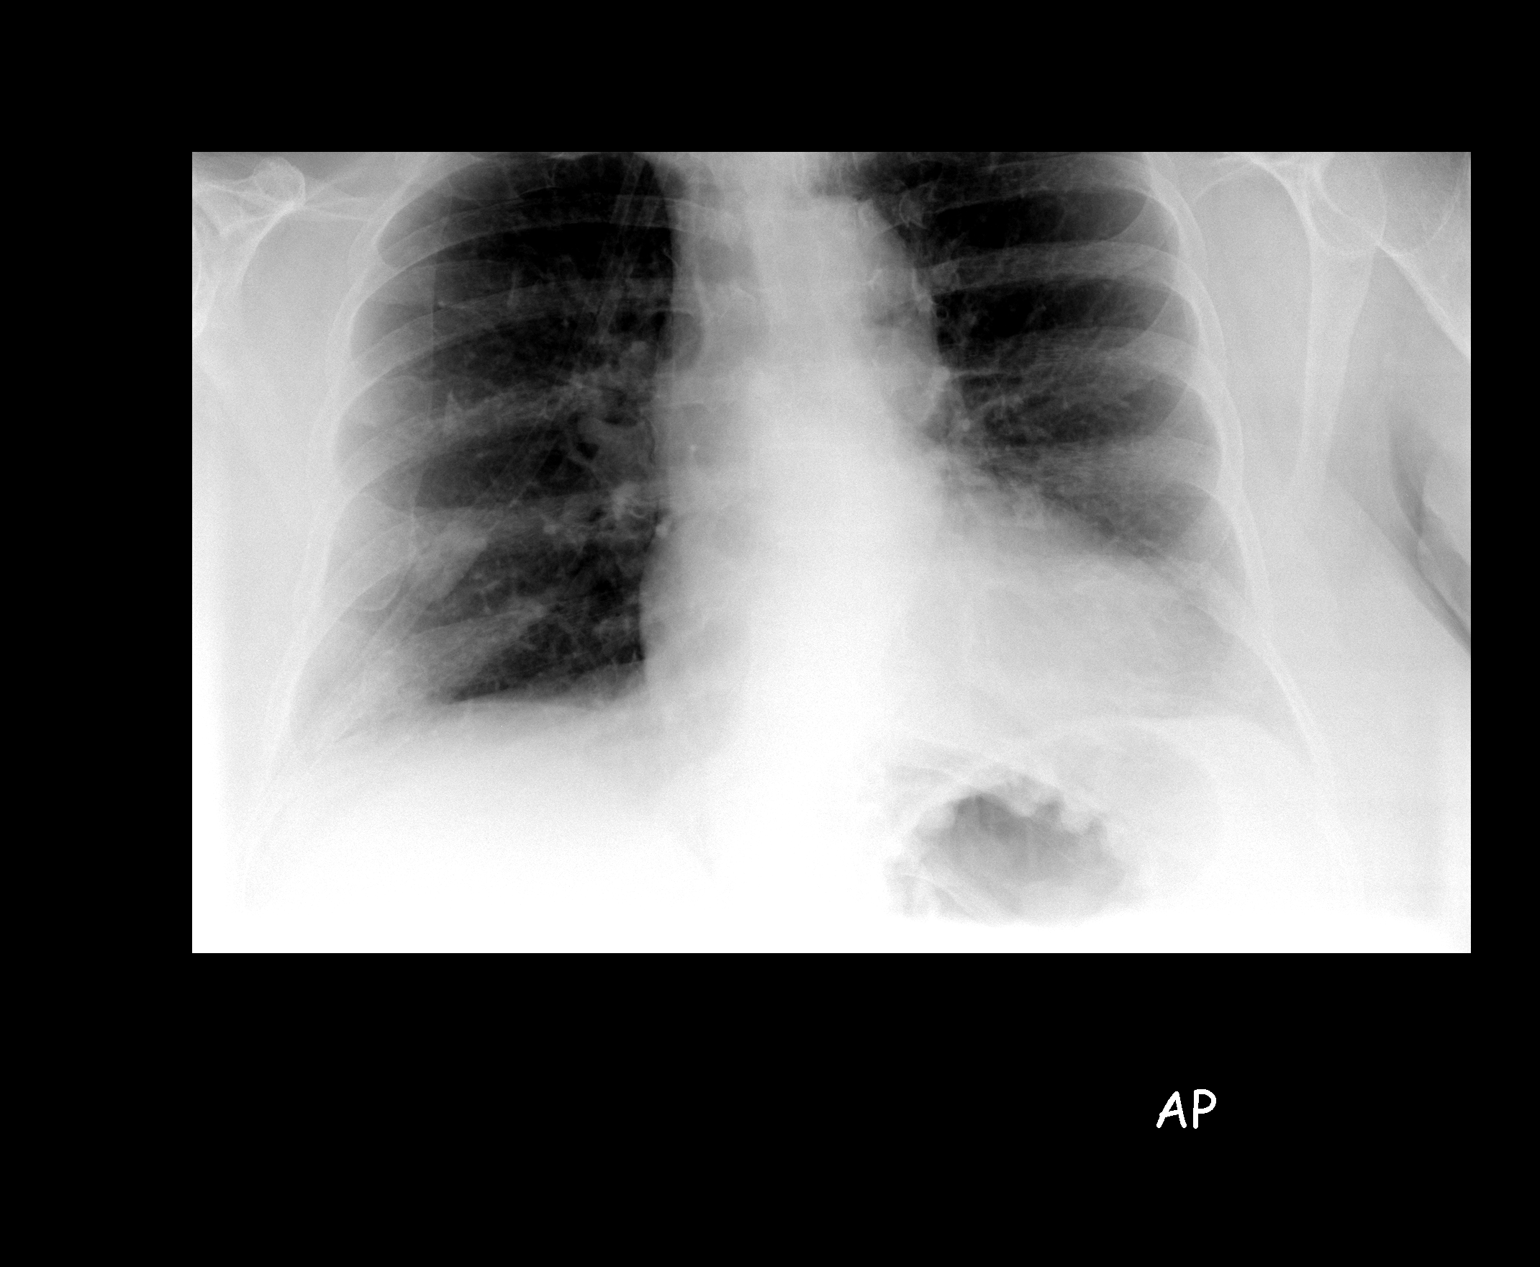

[1 of 1 positions shown; findings below may reference images not displayed]

FINDINGS: Cardiomediastinal silhouette is stable.  The study is
limited by poor inspiration.  Bilateral basilar atelectasis or
early infiltrate is noted.  No pulmonary edema.
IMPRESSION: Limited study by poor inspiration.  Bilateral basilar atelectasis
or infiltrate.

## 2011-01-07 ENCOUNTER — Telehealth: Payer: Self-pay | Admitting: Family Medicine

## 2011-01-09 ENCOUNTER — Encounter: Payer: Self-pay | Admitting: Family Medicine

## 2011-01-10 ENCOUNTER — Encounter: Payer: Self-pay | Admitting: Internal Medicine

## 2011-01-10 ENCOUNTER — Encounter: Payer: Self-pay | Admitting: Family Medicine

## 2011-01-13 ENCOUNTER — Encounter (INDEPENDENT_AMBULATORY_CARE_PROVIDER_SITE_OTHER): Payer: Self-pay | Admitting: *Deleted

## 2011-01-18 ENCOUNTER — Telehealth: Payer: Self-pay | Admitting: Family Medicine

## 2011-01-19 NOTE — Assessment & Plan Note (Signed)
Summary: f/u visit & flu shot/bmc   Vital Signs:  Patient profile:   70 year old female Height:      60.25 inches Weight:      178 pounds BMI:     34.60 Temp:     98.5 degrees F oral Pulse rate:   128 / minute BP sitting:   106 / 72  (left arm) Cuff size:   large  Vitals Entered By: [name] (November 17, 2010 1:47 PM) CC: f/u edema, insomnia, pain Is Patient Diabetic? Yes Did you bring your meter with you today? No Pain Assessment Patient in pain? yes     Location: legs   Primary Care Provider:  [name]  CC:  f/u edema, insomnia, and pain.  History of Present Illness: edema- left > right with some pain with movt and palpation.  DVT was on left 1 year ago.  compression hose help some but does not relieve swelling. is now walking with PT  insomnia- pt and daughter complain about her sleep issues.  pt goes to bed around 7:30 pm, has sleep latency of 30 mn to 1 hour, and wakes up at midnight, 2 and 5 to go to the bathroom.  stays in bed until 7 am.  takes multiple medications for sleep.  has not seen psychiatrist in over a year  pain- complains that she needs increased pain medication but reports taking her breakthrough only once a day.  pain is worst after PT.  complains of pain mostly in the legs.  Habits & Providers  Alcohol-Tobacco-Diet     Alcohol drinks/day: 0     Tobacco Status: quit     Pack years: 20  Current Medications (verified): 1)  Effexor Xr 150 Mg Xr24h-Cap (Venlafaxine Hcl) .... 2 Tabs By Mouth Daily 2)  Topiramate 200 Mg Tabs (Topiramate) .Marland Kitchen.. 1 Tab By Mouth At Bedtime 3)  Trazodone Hcl 150 Mg Tabs (Trazodone Hcl) .... 3 Tabs Po At Bedtime 4)  Spiriva Handihaler 18 Mcg Caps (Tiotropium Bromide Monohydrate) .Marland Kitchen.. 1 Inhalation Daily 5)  Advair Diskus 500-50 Mcg/dose Misc (Fluticasone-Salmeterol) .... Inhale 1 Puff Bid 6)  Gabapentin 600 Mg Tabs (Gabapentin) .... Take 1 Tablet By Mouth Three Times A Day and Take 2 By Mouth At Bedtime 7)   Prodigy Blood Glucose Monitor  Devi (Blood Glucose Monitoring Suppl) .... Dispense #1 For Glucose Monitoring. 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 Check Cbg Two Times A Day.  Disp Qs.  Dm, Type Ii, Icd 250.00. 9)  Prodigy Blood Glucose Test  Strp (Glucose Blood) .... Check Cbg Two Times A Day.  Disp Qs.  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 10)  Ensure  Liqd (Nutritional Supplements) .Marland Kitchen.. 1 Can Three Times A Day With Meals 11)  Dulcolax 5 Mg Tbec (Bisacodyl) .Marland Kitchen.. 1 Tab By Mouth As Needed If No Bowel Movement For 3 Days 12)  Prilosec 40 Mg Cpdr (Omeprazole) .... One By Mouth Q Day 13)  Zofran 4 Mg Tabs (Ondansetron Hcl) .... Take 1 Tablet By Mouth Every 6 Hours As Needed 14)  Seroquel 25 Mg Tabs (Quetiapine Fumarate) .... Take 1 Tab By Mouth At Bedtime 15)  Metformin Hcl 500 Mg Tabs (Metformin Hcl) .... Take 1 Tablet By Mouth Two Times A Day 16)  Alprazolam 1 Mg Tabs (Alprazolam) .Marland Kitchen.. 1 Tab By Mouth Four Times A Day 17)  Oxycontin 20 Mg Xr12h-Tab (Oxycodone Hcl) .... Take One  Po  Two Times A Day 18)  Oxycodone-Acetaminophen 5-325 Mg Tabs (Oxycodone-Acetaminophen) .... Take One Q6 Hours As Needed Breakthrough Pain 19)  Lasix 40 Mg Tabs (Furosemide) .... Take One By Mouth Bid 20)  K-Lor 20 Meq Pack (Potassium Chloride) .... One By Mouth Two Times A Day 21)  Trazodone Hcl 50 Mg Tabs (Trazodone Hcl) .... Take One With Usual Trazadone Dose For 7 Days, Then Take 2 With Dose For 7 Days, Then Take 3 of 150mg  Tabs 22)  Antipyrine-Benzocaine 5.4-1.4 % Soln (Benzocaine-Antipyrine) .... 2-4 Drops in Affected Ear Qid   As Needed Pain  Allergies (verified): 1)  ! Metformin Hcl 2)  Bactrim Ds (Sulfamethoxazole-Trimethoprim)  Past History:  Family History: Last updated: 01/22/2010 A with DM, F, Ax2 with CAD, M-died heart failure, No Ca, osteoporosis, U and M with strokes Family History Emphysema -uncles  Review of Systems  The patient denies anorexia, fever, and weight loss.     Physical Exam  General:  NAD Lungs:  Normal respiratory effort, chest expands symmetrically. Lungs are clear to auscultation, no crackles or wheezes. Heart:  Normal rate and regular rhythm. S1 and S2 normal without gallop, murmur, click, rub or other extra sounds. Abdomen:  obese, mild tenderness diffusely Extremities:  1 plus edema to thighs.  wearing compression hose.  painful to palpation   Impression & Recommendations:  Problem # 1:  EDEMA (ICD-782.3) Assessment Deteriorated gave script for fitted compression hose.  will call [facility] to see if SCDs at home are possible per daughters request.  will send for dopplers today as tender swollen with larger left leg.  will need to call [facility] to advise about filter ? remove.    Her updated medication list for this problem includes:    Lasix 40 Mg Tabs (Furosemide) .Marland Kitchen... Take one by mouth bid  Orders: LE Venous Duplex (DVT) (DVT) [facility]- Est  Level 4 (78295)  Problem # 2:  INSOMNIA, CHRONIC (ICD-307.42) Assessment: Unchanged no changes to meds.  polypharmacy and hasn't seen psychiatrist.  daughter agrees to get her to psychiatrist.  recommended a later bed time, also advised that pt will be unlikely to be able to stay in bed 10 hours without needing to urinate- normal for her. Orders: [facility]- Est  Level 4 (62130)  Problem # 3:  OTHER CHRONIC PAIN (ICD-338.29) Assessment: Unchanged pt with significant chronic pain issues.  gave 2 month refills on percocet, oxycontin, and xanax.  daughter aware needs appt for refills q 2 months Orders: [facility]- Est  Level 4 (86578)  Complete Medication List: 1)  Effexor Xr 150 Mg Xr24h-cap (Venlafaxine hcl) .... 2 tabs by mouth daily 2)  Topiramate 200 Mg Tabs (Topiramate) .Marland Kitchen.. 1 tab by mouth at bedtime 3)  Trazodone Hcl 150 Mg Tabs (Trazodone hcl) .... 3 tabs po at bedtime 4)  Spiriva Handihaler 18 Mcg Caps (Tiotropium bromide monohydrate) .Marland Kitchen.. 1 inhalation daily 5)  Advair Diskus 500-50 Mcg/dose Misc  (Fluticasone-salmeterol) .... Inhale 1 puff bid 6)  Gabapentin 600 Mg Tabs (Gabapentin) .... Take 1 tablet by mouth three times a day and take 2 by mouth at bedtime 7)  Prodigy Blood Glucose Monitor Devi (Blood glucose monitoring suppl) .... Dispense #1 for glucose monitoring. dm, type ii.  icd 250.00 8)  Prodigy Lancets 28g Misc (Lancets) .... Check cbg two times a day.  disp qs.  dm, type ii, icd 250.00. 9)  Prodigy Blood Glucose Test Strp (Glucose blood) .... Check cbg two times a day.  disp qs.  dm, type  ii.  icd 250.00. 10)  Ensure Liqd (Nutritional supplements) .Marland Kitchen.. 1 can three times a day with meals 11)  Dulcolax 5 Mg Tbec (Bisacodyl) .Marland Kitchen.. 1 tab by mouth as needed if no bowel movement for 3 days 12)  Prilosec 40 Mg Cpdr (Omeprazole) .... One by mouth q day 13)  Zofran 4 Mg Tabs (Ondansetron hcl) .... Take 1 tablet by mouth every 6 hours as needed 14)  Seroquel 25 Mg Tabs (Quetiapine fumarate) .... Take 1 tab by mouth at bedtime 15)  Metformin Hcl 500 Mg Tabs (Metformin hcl) .... Take 1 tablet by mouth two times a day 16)  Alprazolam 1 Mg Tabs (Alprazolam) .Marland Kitchen.. 1 tab by mouth four times a day 17)  Oxycontin 20 Mg Xr12h-tab (Oxycodone hcl) .... Take one  po  two times a day 18)  Oxycodone-acetaminophen 5-325 Mg Tabs (Oxycodone-acetaminophen) .... Take one q6 hours as needed breakthrough pain 19)  Lasix 40 Mg Tabs (Furosemide) .... Take one by mouth bid 20)  K-lor 20 Meq Pack (Potassium chloride) .... One by mouth two times a day 21)  Trazodone Hcl 50 Mg Tabs (Trazodone hcl) .... Take one with usual trazadone dose for 7 days, then take 2 with dose for 7 days, then take 3 of 150mg  tabs 22)  Antipyrine-benzocaine 5.4-1.4 % Soln (Benzocaine-antipyrine) .... 2-4 drops in affected ear qid   as needed pain  Other Orders: A1C-[facility] (16109) Pneumococcal Vaccine (60454) Admin 1st Vaccine (09811) Flu Vaccine 67yrs + (91478) Admin of Any Addtl Vaccine (29562) Prescriptions: K-LOR 20 MEQ PACK  (POTASSIUM CHLORIDE) one by mouth two times a day  #60 x 11   Entered by:   [name]   Authorized by:   [name]   Signed by:   [name] on 13/07/6577   Method used:   Electronically to        [facility]  [address] [id number] (retail)       [address]       [place], [place]  [address]       Ph: [phone] or [phone]       Fax: [phone]   RxID:   [id number]    Orders Added: 1)  A1C-[facility] [83036] 2)  Pneumococcal Vaccine [90732] 3)  Admin 1st Vaccine [90471] 4)  Flu Vaccine 17yrs + [06301] 5)  Admin of Any Addtl Vaccine [90472] 6)  LE Venous Duplex (DVT) [DVT] 7)  [facility]- Est  Level 4 [60109]   Immunizations Administered:  Pneumonia Vaccine:    Vaccine Type: Pneumovax    Site: left deltoid    Mfr: Merck    Dose: 0.5 ml    Route: IM    Given by: [name]    Exp. Date: 04/15/2012    Lot #: [id number]    VIS given: 11/24/09 version given November 17, 2010.  Influenza Vaccine # 1:    Vaccine Type: Fluvax 3+    Site: right deltoid    Mfr: GlaxoSmithKline    Dose: 0.5 ml    Route: IM    Given by: [name]    Exp. Date: 06/16/2011    Lot #: [id number]    VIS given: 07/14/10 version given November 17, 2010.  Flu Vaccine Consent Questions:    Do you have a history of severe allergic reactions to this vaccine? no    Any prior history of allergic reactions to egg and/or gelatin? no  Do you have a sensitivity to the preservative Thimersol? no    Do you have a past history of Guillan-Barre Syndrome? no    Do you currently have an acute febrile illness? no    Have you ever had a severe reaction to latex? no    Vaccine information given and explained to patient? yes    Are you currently pregnant? no   Immunizations Administered:  Pneumonia Vaccine:    Vaccine Type: Pneumovax    Site: left deltoid    Mfr: Merck    Dose: 0.5 ml    Route: IM    Given by: [name]    Exp. Date: 04/15/2012    Lot #: [id number]     VIS given: 11/24/09 version given November 17, 2010.  Influenza Vaccine # 1:    Vaccine Type: Fluvax 3+    Site: right deltoid    Mfr: GlaxoSmithKline    Dose: 0.5 ml    Route: IM    Given by: [name]    Exp. Date: 06/16/2011    Lot #: [id number]    VIS given: 07/14/10 version given November 17, 2010.  Laboratory Results   Blood Tests   Date/Time Received: November 17, 2010 1:40 PM  Date/Time Reported: November 17, 2010 2:54 PM   HGBA1C: 6.2%   (Normal Range: Non-Diabetic - 3-6%   Control Diabetic - 6-8%)  Comments: ...........test performed by...........Marland KitchenTerese Door, CMA      Orders Added: 1)  A1C-[facility] [83036] 2)  Pneumococcal Vaccine [90732] 3)  Admin 1st Vaccine [90471] 4)  Flu Vaccine 34yrs + [57846] 5)  Admin of Any Addtl Vaccine [90472] 6)  LE Venous Duplex (DVT) [DVT] 7)  [facility]- Est  Level 4 [96295]

## 2011-01-19 NOTE — Consult Note (Signed)
Summary: [facility]  [facility]   Imported By: Clydell Hakim 01/14/2010 12:20:06  _____________________________________________________________________  External Attachment:    Type:   Image     Comment:   External Document
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
## 2011-01-19 NOTE — Consult Note (Signed)
Summary: [facility]   Imported By: Bradly Bienenstock 03/24/2010 15:32:24  _____________________________________________________________________  External Attachment:    Type:   Image     Comment:   External Document  Appended Document: [facility] Will forward records to Dr. Claudie Fisherman.

## 2011-01-19 NOTE — Initial Assessments (Signed)
Summary: Nausea/Vomiting/Abd pain    Primary Care Provider:  [name]  CC:  Nausea/Vomiting/Abd pain.  History of Present Illness: 70 y/o F with severe abdominal pain.  Pt. has dementia and is a poor historian.  She claims she has not had a BM in 10 days.  She says her stomach hurts a lot.  She denies other complaints but it is difficult to assess more information because of her dementia.    Current Medications (verified): 1)  Effexor Xr 150 Mg Xr24h-Cap (Venlafaxine Hcl) .... 2 Tabs By Mouth Daily 2)  Topiramate 200 Mg Tabs (Topiramate) .Marland Kitchen.. 1 Tab By Mouth At Bedtime 3)  Trazodone Hcl 150 Mg Tabs (Trazodone Hcl) .... 2 Tabs Po At Bedtime 4)  Spiriva Handihaler 18 Mcg Caps (Tiotropium Bromide Monohydrate) .Marland Kitchen.. 1 Inhalation Daily 5)  Advair Diskus 500-50 Mcg/dose Misc (Fluticasone-Salmeterol) .... Inhale 1 Puff Bid 6)  Gabapentin 600 Mg Tabs (Gabapentin) .... Take 1 Tablet By Mouth Three Times A Day 7)  Prodigy Blood Glucose Monitor  Devi (Blood Glucose Monitoring Suppl) .... Dispense #1 For Glucose Monitoring. 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 Check Cbg Two Times A Day.  Disp Qs.  Dm, Type Ii, Icd 250.00. 9)  Prodigy Blood Glucose Test  Strp (Glucose Blood) .... Check Cbg Two Times A Day.  Disp Qs.  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 10)  Ensure  Liqd (Nutritional Supplements) .Marland Kitchen.. 1 Can Three Times A Day With Meals 11)  Dulcolax 5 Mg Tbec (Bisacodyl) .Marland Kitchen.. 1 Tab By Mouth As Needed If No Bowel Movement For 3 Days 12)  Prilosec 20 Mg Cpdr (Omeprazole) .... Take 1 Tablet By Mouth Two Times A Day 13)  Zofran 4 Mg Tabs (Ondansetron Hcl) .... Take 1 Tablet By Mouth Every 6 Hours As Needed 14)  Seroquel 25 Mg Tabs (Quetiapine Fumarate) .... Take 1 Tab By Mouth At Bedtime 15)  Depakote Sprinkles 125 Mg Cpsp (Divalproex Sodium) .... Once Daily 16)  Metformin Hcl 500 Mg Tabs (Metformin Hcl) .... Take 1 Tablet By Mouth Two Times A Day 17)  Alprazolam 1 Mg Tabs  (Alprazolam) .Marland Kitchen.. 1 Tab By Mouth Four Times A Day  Allergies (verified): 1)  ! Metformin Hcl 2)  Bactrim Ds (Sulfamethoxazole-Trimethoprim)   Past History:  Past Medical History: Last updated: 05/22/2008 Bipolar disease- managed by Dr. 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 Danielle Dess)  Past Surgical History: Last updated: 01/22/2010 cardiolite-07/2006-low risk see report - 08/31/2006 Cholecystectomy  Family History: Last updated: 01/22/2010 A with DM, F, Ax2 with CAD, M-died heart failure, No Ca, osteoporosis, U and M with strokes Family History Emphysema -uncles  Social History: Last updated: 09/23/2010 Disability due to COPD; Currently living with daughter.  Divorced -1983; 20 pack yr h/o tob, now no cigs sine 11/05; no EtOH, drugs; 1 sister in [place]; 2 dtrs in [place] ages 55 and 34;   Risk Factors: Alcohol Use: 0 (08/31/2009)  Risk Factors: Smoking Status: quit (10/06/2009)   Review of Systems       Pt. complains that she wet herself. She complains of chronic cough and leg pain.  ROS otherwise negative.  General:  Denies chills and fever.   Vital Signs:  Patient profile:   70 year old female Temp:     98.6 degrees F   Physical Exam  General:  Well-developed,well-nourished,in no acute distress; alert,appropriate and cooperative throughout examination Head:  Normocephalic and atraumatic without obvious abnormalities. Neck:  No deformities, masses, or tenderness noted. Lungs:  Normal respiratory effort, chest expands symmetrically. Lungs are clear to auscultation, no crackles or wheezes. Heart:  Normal rate and regular rhythm. S1 and S2  normal without gallop, murmur, click, rub or other extra sounds. Abdomen:  Bowel sounds positive,abdomen soft and non-tender without masses, organomegaly or hernias noted. Rectal:  Rectal exam positive for stool balls, no other abnormalities.  Pulses:  R and L ,radial,dorsalis pedis and posterior tibial pulses are full and equal bilaterally Neurologic:  Pt. understands why she is in the hospital but is disoriented to time.  Additional Exam:  1. Acute abd series:  Findings concerning for early/developing small bowel obstruction.  There is no intra-abdominal free air seen.  There are   prompt loops of small bowel seen in the midportion of the abdomen  measuring up to 3.5 cm.  There is gaseous distension seen in the   stomach.  There is some gas and stool seen in the colon.  The bones are osteopenic and there is a compression fracture of the L2   vertebral body.  An IVC filter is seen projecting to the right of  the L3 vertebral body. 2. CXR: No consolidation.  NGT present.  3. AXR: Gaseous distention of bowel 4. CT abd/pelvis:  Minimal bibasilar atelectasis.  Tiny right renal cyst.  Pneumobilia, new since previous exam; this can be seen with prior   ERCP with sphincterotomy as well as biliary bypass procedures,  though correlation with patient history recommended to exclude   other etiologies.  Increased stool load within colon. 5. ISTAT: Hb 13.9, HCT 41.0, Na 137, K 4.1, Cl 106, Gluc 169, BUN 6, Cr 0.5 6. CBC: 6.6 > 12.9/37.9 < 224  ANC 61% 7. LFT:  TB 0.7, DB 0.1, Ind B 0.6, Alp 77, AST 20, ALT 17, TP 7.1, Alb 3.3 8. Lipase 21 9. UA: negative 10. Lactic Acid: 1.9    There is no intra-abdominal free air seen.  There are   prompt loops of small bowel seen in the midportion of the abdomen  measuring up to 3.5 cm.  There is gaseous distension seen in the   stomach.  There is some gas and stool seen in the colon.  The bones are osteopenic and there is a compression fracture of the L2   vertebral  body.  An IVC filter is seen projecting to the right of  the L3 vertebral body. 2. CXR: No consolidation.  NGT present.  3. AXR: Gaseous distention of bowel 4. CT abd/pelvis:  Minimal bibasilar atelectasis.  Tiny right renal cyst.  Pneumobilia, new since previous exam; this can be seen with prior   ERCP with sphincterotomy as well as biliary bypass procedures,  though correlation with patient history recommended to exclude   other etiologies.  Increased stool load within colon. 5. ISTAT: Hb 13.9, HCT 41.0, Na 137, K 4.1, Cl 106, Gluc 169, BUN 6, Cr 0.5 6. CBC: 6.6 > 12.9/37.9 < 224  ANC 61% 7. LFT:  TB 0.7, DB 0.1, Ind B 0.6, Alp 77, AST 20, ALT 17, TP 7.1, Alb 3.3 8. Lipase 21 9. UA: negative 10. Lactic Acid: 1.9  Impression & Recommendations:  Problem # 1:  NAUSEA / Vomiting / Abd Pain  CT abd/pelvis showed pneumobilia and feces in colon.  Will give morphine, phenergan, and zofran for symptom control. Will d/c NG tube that pt. was transferred with as she is not actively nautious of vomiting.   Check heme occult.  Check CBC in am.   Problem # 2:  CONSTIPATION (ICD-564.00) No BMs x 10 days.  Will give ducolax suppository pr daily until soft stools.  will give colace 100 mg by mouth two times a day.   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  Will keep SCDs b/l while in bed.  Problem # 4:  SYMPTOM, INCONTINENCE, URINARY NOS (ICD-788.30) UA negative.  This is a chronic problem, we will continue adult diapers.   Problem # 5:  GERD (ICD-530.81) Continue prilosec.  Her updated medication list for this problem includes:    Prilosec 20 Mg Cpdr (Omeprazole) .Marland Kitchen... Take 1 tablet by mouth two times a day  Problem # 6:  DIABETES MELLITUS, TYPE II (ICD-250.00) most recent A1c 5.7, pt will be NPO for now, so no DM meds until she has a diet. Will Check an A1c.   Problem # 7:  BIPOLAR DISORDER (ICD-296.7) Continue Seroquel, depakote.    Problem # 8:  ANXIETY (ICD-300.00) Continue home medications Her updated medication list for this problem includes:    Effexor Xr 150 Mg Xr24h-cap (Venlafaxine hcl) .Marland Kitchen... 2 tabs by mouth daily    Trazodone Hcl 150 Mg Tabs (Trazodone hcl) .Marland Kitchen... 2 tabs po at bedtime    Alprazolam 1 Mg Tabs (Alprazolam) .Marland Kitchen... 1 tab by mouth four times a day  Problem # 9:  COPD (ICD-496) Continue home medications Her updated medication list for this problem includes:    Spiriva Handihaler 18 Mcg Caps (Tiotropium bromide monohydrate) .Marland Kitchen... 1 inhalation daily    Advair Diskus 500-50 Mcg/dose Misc (Fluticasone-salmeterol) ..... Inhale 1 puff bid  Problem # 10:  FEN/GI NS @125  cc/hr.  NPO for now.  Will check BMET in am.   Problem # 11:  Code Status pt not competent to make medical decisions, will address tomorrow when family is present.   Problem # 12:  Disposition Pending clinical improvement.   Complete Medication List: 1)  Effexor Xr 150 Mg Xr24h-cap (Venlafaxine hcl) .... 2 tabs by mouth daily 2)  Topiramate 200 Mg Tabs (Topiramate) .Marland Kitchen.. 1 tab by mouth at bedtime 3)  Trazodone Hcl 150 Mg Tabs (Trazodone hcl) .... 2 tabs po at bedtime 4)  Spiriva Handihaler 18 Mcg Caps (Tiotropium bromide monohydrate) .Marland Kitchen.. 1 inhalation daily 5)  Advair Diskus 500-50 Mcg/dose Misc (Fluticasone-salmeterol) .... Inhale 1 puff bid 6)  Gabapentin 600 Mg Tabs (Gabapentin) .... Take 1 tablet by mouth three times a day 7)  Prodigy Blood Glucose Monitor Devi (Blood glucose monitoring suppl) .... Dispense #1 for glucose monitoring. dm, type ii.  icd 250.00 8)  Prodigy Lancets 28g Misc (Lancets) .... Check cbg two times a day.  disp qs.  dm, type ii, icd 250.00. 9)  Prodigy Blood Glucose Test Strp (Glucose blood) .... Check cbg two times a day.  disp qs.  dm, type ii.  icd 250.00. 10)  Ensure Liqd (Nutritional supplements) .Marland Kitchen.. 1 can three times a day with meals 11)  Dulcolax 5 Mg Tbec (Bisacodyl) .Marland Kitchen.. 1 tab by mouth as needed  if no bowel movement for 3 days 12)  Prilosec 20  Mg Cpdr (Omeprazole) .... Take 1 tablet by mouth two times a day 13)  Zofran 4 Mg Tabs (Ondansetron hcl) .... Take 1 tablet by mouth every 6 hours as needed 14)  Seroquel 25 Mg Tabs (Quetiapine fumarate) .... Take 1 tab by mouth at bedtime 15)  Depakote Sprinkles 125 Mg Cpsp (Divalproex sodium) .... Once daily 16)  Metformin Hcl 500 Mg Tabs (Metformin hcl) .... Take 1 tablet by mouth two times a day 17)  Alprazolam 1 Mg Tabs (Alprazolam) .Marland Kitchen.. 1 tab by mouth four times a day   Social History: Disability due to COPD; Currently living with daughter.  Divorced -1983; 20 pack yr h/o tob, now no cigs sine 11/05; no EtOH, drugs; 1 sister in [place]; 2 dtrs in [place] ages 48 and 45;      Addendum to R1 Note by R3  cc: constipation, abd pain  hpi:  70 y/o F with multiple medical issues including bipolar do, chronic constipation, history of PE/DVT s/p IVC filter presented to [facility] for constipation and abdominal pain.  Unfortunately family is not present during our exam so some of our history if from EDP and pt.  Pt states that she has not had BM  for 10 days.  She started having nausea and vomiting today with some generalized abdominal pain.  She states that the last time she vomited was at home today.  She denies chest pain, fever, chills, loss of appetite, diarrhea.  In the ER she had work up with CT abd/pelvis that showed new pneumobilia and feces in colon.  General Surgery was consulted by EDP and Dr Dwain Sarna saw the patient.  Although he does not feel that pt has surgical abdomen at this time he believes that she warrants admission for monitoring.    ROS: as per hpi   PE: 98.6   20  68  122/80   98% 3L [place] Gen: NAD, cooperative and appropriate through exam.   HEENT: PERRLA, EOMI, MMM, No nuchal ridgidity or mass CVS: RRR, S1S2, no murmurs Resp: good respiratory effort, some rhonchi Abd: obese. soft, nontender, nondistended, +BS EXT: faint  but palpable pulses b/l, no edema GU: rectal exam showed good sphincter tone, and lots of small stool balls were felt Neuro: nonfocal  Labs/Studies:  1. Acute abd series:  Findings concerning for early/developing small bowel obstruction.  There is no intra-abdominal free air seen.  There are prompt loops of small bowel seen in the midportion of the abdomen  measuring up to 3.5 cm.  There is gaseous distension seen in the stomach.  There is some gas and stool seen in the colon.  The bones are osteopenic and there is a compression fracture of the L2 vertebral body.  An IVC filter is seen projecting to the right of  the L3 vertebral body. 2. CXR: No consolidation.  NGT present.  3. AXR: Gaseous distention of bowel 4. CT abd/pelvis:  Minimal bibasilar atelectasis.  Tiny right renal cyst.  Pneumobilia, new since previous exam; this can be seen with prior  ERCP with sphincterotomy as well as biliary bypass procedures,  though correlation with patient history recommended to exclude  other etiologies.  Increased stool load within colon. 5. ISTAT: Hb 13.9, HCT 41.0, Na 137, K 4.1, Cl 106, Gluc 169, BUN 6, Cr 0.5 6. CBC: 6.6 > 12.9/37.9 < 224  ANC 61% 7. LFT:  TB 0.7, DB 0.1, Ind B 0.6, Alp 77, AST 20, ALT 17, TP 7.1, Alb 3.3 8.  Lipase 21 9. UA: negative 10. Lactic Acid: 1.9  A/P: 70 y/o F here with nausea/vomiting, abdominal pain  1.  Abd pain:  LFT, CBC, Lipase, UA are wnl.  Discussed imagings with Radiologist, Dr Margo Aye.  He does not feel that pt has a small bowel obstruction.  CT abd/pelvis showed new pneumobilia, which may be from ERCP or fistula.  Pt also is s/p cholecystectomy Pt has been examined by Dr Rondel Baton, who recommends monitoring the patient, performing serial abd exams, and repeat KUB in AM.  Since pt is afebrile and WBC is wnl, negative UA I will not start pt on antibiotic, though will have low threshold to start atbx if pt becomes febrile.  Abd pain may be secondary to constipation.   Rectal exam performed showing lots of stool balls.  Will give suppository to assist with BM. Will also check for FOBT.  2. Nausea/Vomiting:  etiology abd pain and constipation?  See above.  Zofran and Phenergan as needed.  3.  History of PE/DVT s/p IVF.  No coumadin secondary to history of retroparetaneal bleed.  4.  Diabetes:  Pt has been off of Metformin since 09/2009 due to diarrhea.  Last A1C was 5.1 in 11/2009.   Will not resume Metformin at this time.  Will recheck A1C.  5.  GERD: Prilosec 20mg  two times a day  6.  Psych: Bipolar DO and Depression: cont home meds  7.  FEN/GI: 1/2 NS@ 125cc/hr.  NPO for now.   8.  Prophylaxis: B/L SCDs secondary to history of GI bleed.  10.  Disposition: clinical improvement.  Cat Ta MD  September 24, 2010 1:08 AM

## 2011-01-19 NOTE — Assessment & Plan Note (Signed)
Summary: [name],[name]   Vital Signs:  Patient profile:   70 year old female Height:      60.25 inches O2 Sat:      100 % on 3 L/min Pulse rate:   103 / minute BP sitting:   112 / 78  (left arm) Cuff size:   large  Vitals Entered By: [name] (September 30, 2010 1:47 PM)  O2 Flow:  3 L/min CC: ear pain, constipation, DM, Hypertension Management Is Patient Diabetic? Yes Did you bring your meter with you today? No Pain Assessment Patient in pain? no        Diabetic Foot Exam Last Podiatry Exam Date: 09/09/2010  Foot Inspection Is there a history of a foot ulcer?              No Is there a foot ulcer now?              No Can the patient see the bottom of their feet?          Yes Are the shoes appropriate in style and fit?          Yes Is there swelling or an abnormal foot shape?          Yes Are the toenails long?                No Are the toenails thick?                No Are the toenails ingrown?              No Is there heavy callous build-up?              No Is there pain in the calf muscle (Intermittent claudication) when walking?    NoIs there a claw toe deformity?              No Is there elevated skin temperature?            No Is there limited ankle dorsiflexion?            Yes Is there foot or ankle muscle weakness?            No  Diabetic Foot Care Education Patient educated on appropriate care of diabetic feet.   High Risk Feet? Yes   Primary Care Provider:  [name]  CC:  ear pain, constipation, DM, and Hypertension Management.  History of Present Illness: anxiety/trouble sleeping-  pt on mult psych meds and previously had a psychiatrist overseeing them.  went to NH and had meds changed.  havign trouble sleeping due to anxiety and pain in her legs.      constipation- recent hosp admit for abd pain, constipation.  has not had a BM in 3 days.  has not tried the medications prescribed at the hospital.  no abd pain  DM- taking metformin and  checking CBGs two times a day  no CBGs greater tahn 130.    HTN- at goal.  taking lasix mostly for swelling in legs.  no HA, CP, SOB, syncope  Hypertension History:      She denies headache, chest pain, and syncope.  She notes no problems with any antihypertensive medication side effects.        Positive major cardiovascular risk factors include female age 23 years old or older, diabetes, hyperlipidemia, and hypertension.  Negative major cardiovascular risk factors include non-tobacco-user status.      Habits & Providers  Alcohol-Tobacco-Diet     Alcohol drinks/day: 0     Tobacco Status: quit     Pack years: 20  Current Medications (verified): 1)  Effexor Xr 150 Mg Xr24h-Cap (Venlafaxine Hcl) .... 2 Tabs By Mouth Daily 2)  Topiramate 200 Mg Tabs (Topiramate) .Marland Kitchen.. 1 Tab By Mouth At Bedtime 3)  Trazodone Hcl 150 Mg Tabs (Trazodone Hcl) .... 2 Tabs Po At Bedtime 4)  Spiriva Handihaler 18 Mcg Caps (Tiotropium Bromide Monohydrate) .Marland Kitchen.. 1 Inhalation Daily 5)  Advair Diskus 500-50 Mcg/dose Misc (Fluticasone-Salmeterol) .... Inhale 1 Puff Bid 6)  Gabapentin 600 Mg Tabs (Gabapentin) .... Take 1 Tablet By Mouth Three Times A Day and Take 2 By Mouth At Bedtime 7)  Prodigy Blood Glucose Monitor  Devi (Blood Glucose Monitoring Suppl) .... Dispense #1 For Glucose Monitoring. 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 Check Cbg Two Times A Day.  Disp Qs.  Dm, Type Ii, Icd 250.00. 9)  Prodigy Blood Glucose Test  Strp (Glucose Blood) .... Check Cbg Two Times A Day.  Disp Qs.  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 10)  Ensure  Liqd (Nutritional Supplements) .Marland Kitchen.. 1 Can Three Times A Day With Meals 11)  Dulcolax 5 Mg Tbec (Bisacodyl) .Marland Kitchen.. 1 Tab By Mouth As Needed If No Bowel Movement For 3 Days 12)  Prilosec 40 Mg Cpdr (Omeprazole) .... One By Mouth Q Day 13)  Zofran 4 Mg Tabs (Ondansetron Hcl) .... Take 1 Tablet By Mouth Every 6 Hours As Needed 14)  Seroquel 25 Mg Tabs (Quetiapine  Fumarate) .... Take 1 Tab By Mouth At Bedtime 15)  Metformin Hcl 500 Mg Tabs (Metformin Hcl) .... Take 1 Tablet By Mouth Two Times A Day 16)  Alprazolam 1 Mg Tabs (Alprazolam) .Marland Kitchen.. 1 Tab By Mouth Four Times A Day 17)  Oxycontin 20 Mg Xr12h-Tab (Oxycodone Hcl) .... Take One  Po  Two Times A Day 18)  Oxycodone-Acetaminophen 5-325 Mg Tabs (Oxycodone-Acetaminophen) .... Take One Q6 Hours As Needed Breakthrough Pain 19)  Lasix 40 Mg Tabs (Furosemide) .... Take One By Mouth Bid 20)  K-Lor 20 Meq Pack (Potassium Chloride) .... One By Mouth Two Times A Day  Allergies (verified): 1)  ! Metformin Hcl 2)  Bactrim Ds (Sulfamethoxazole-Trimethoprim)  Review of Systems  The patient denies anorexia, fever, weight loss, chest pain, and syncope.    Physical Exam  General:  vs reviewed alert, well hydrated Head:  normocephalic and atraumatic.   Lungs:  Normal respiratory effort, chest expands symmetrically. Lungs are clear to auscultation, no crackles or wheezes. Heart:  normal rate and regular rhythm.   Abdomen:  soft, non-tender, normal bowel sounds, and no distention.   Extremities:  1 plus edema to mid calf   Impression & Recommendations:  Problem # 1:  CONSTIPATION (ICD-564.00) Assessment Unchanged f/u from hospital admission.  has not tied the perscribed meds.  asked her to use those meds.  see pt instructions.  Her updated medication list for this problem includes:    Dulcolax 5 Mg Tbec (Bisacodyl) .Marland Kitchen... 1 tab by mouth as needed if no bowel movement for 3 days  Orders: [facility]- Est  Level 4 ([address])  Problem # 2:  DIABETES MELLITUS, TYPE II (ICD-250.00) Assessment: Unchanged continue metformin.  reduce fingersticks to q day as only on metformin. Her updated medication list for this problem includes:    Metformin Hcl 500 Mg Tabs (Metformin hcl) .Marland Kitchen... Take 1 tablet by mouth two times  a day  Orders: [facility]- Est  Level 4 ([address])  Problem # 3:  HYPERTENSION, BENIGN SYSTEMIC  (ICD-401.1) Assessment: Unchanged at goal.  no changes to regimen Her updated medication list for this problem includes:    Lasix 40 Mg Tabs (Furosemide) .Marland Kitchen... Take one by mouth bid  Orders: [facility]- Est  Level 4 ([address])  Problem # 4:  ANXIETY (ICD-300.00) Assessment: Deteriorated refilled her medications.  asked her to see psych to give recs on medication list.  ?consider sending to pharm clinic to review meds.  Since pt complaining aboout not being able to sleep despite a large and varied list of sedating drugs, would make slow increases.  will start by increasing gabapentin.  consider changing trazadone if still needs more sedation. Her updated medication list for this problem includes:    Effexor Xr 150 Mg Xr24h-cap (Venlafaxine hcl) .Marland Kitchen... 2 tabs by mouth daily    Trazodone Hcl 150 Mg Tabs (Trazodone hcl) .Marland Kitchen... 2 tabs po at bedtime    Alprazolam 1 Mg Tabs (Alprazolam) .Marland Kitchen... 1 tab by mouth four times a day  Orders: [facility]- Est  Level 4 ([address])  Complete Medication List: 1)  Effexor Xr 150 Mg Xr24h-cap (Venlafaxine hcl) .... 2 tabs by mouth daily 2)  Topiramate 200 Mg Tabs (Topiramate) .Marland Kitchen.. 1 tab by mouth at bedtime 3)  Trazodone Hcl 150 Mg Tabs (Trazodone hcl) .... 2 tabs po at bedtime 4)  Spiriva Handihaler 18 Mcg Caps (Tiotropium bromide monohydrate) .Marland Kitchen.. 1 inhalation daily 5)  Advair Diskus 500-50 Mcg/dose Misc (Fluticasone-salmeterol) .... Inhale 1 puff bid 6)  Gabapentin 600 Mg Tabs (Gabapentin) .... Take 1 tablet by mouth three times a day and take 2 by mouth at bedtime 7)  Prodigy Blood Glucose Monitor Devi (Blood glucose monitoring suppl) .... Dispense #1 for glucose monitoring. dm, type ii.  icd 250.00 8)  Prodigy Lancets 28g Misc (Lancets) .... Check cbg two times a day.  disp qs.  dm, type ii, icd 250.00. 9)  Prodigy Blood Glucose Test Strp (Glucose blood) .... Check cbg two times a day.  disp qs.  dm, type ii.  icd 250.00. 10)  Ensure Liqd (Nutritional supplements) .Marland Kitchen.. 1 can  three times a day with meals 11)  Dulcolax 5 Mg Tbec (Bisacodyl) .Marland Kitchen.. 1 tab by mouth as needed if no bowel movement for 3 days 12)  Prilosec 40 Mg Cpdr (Omeprazole) .... One by mouth q day 13)  Zofran 4 Mg Tabs (Ondansetron hcl) .... Take 1 tablet by mouth every 6 hours as needed 14)  Seroquel 25 Mg Tabs (Quetiapine fumarate) .... Take 1 tab by mouth at bedtime 15)  Metformin Hcl 500 Mg Tabs (Metformin hcl) .... Take 1 tablet by mouth two times a day 16)  Alprazolam 1 Mg Tabs (Alprazolam) .Marland Kitchen.. 1 tab by mouth four times a day 17)  Oxycontin 20 Mg Xr12h-tab (Oxycodone hcl) .... Take one  po  two times a day 18)  Oxycodone-acetaminophen 5-325 Mg Tabs (Oxycodone-acetaminophen) .... Take one q6 hours as needed breakthrough pain 19)  Lasix 40 Mg Tabs (Furosemide) .... Take one by mouth bid 20)  K-lor 20 Meq Pack (Potassium chloride) .... One by mouth two times a day  Hypertension Assessment/Plan:      [name] hypertensive risk group is category C: Target organ damage and/or diabetes.  Her calculated 10 year risk of coronary heart disease is 8 %.  Today's blood pressure is 112/78.    Patient Instructions: 1)  It was nice to  see you again! 2)  I recommend that you call the psychiatrist you have seen before for a "medication consult" 3)  For now, we will go up on the neurontin.  I sent in the new perscription. 4)  In 1-2 weeks, if you still need more help, call and leave me a message.  I will send in a higher dose of trazadone. 5)  For the filter, it can no longer be removed.  We will just not worry about it. 6)  For the constipation-this is what they prescribed in teh hospital: 7)   1. Dulcolax 10 mg rectally daily as needed for severe constipation.  8)   2. Colace 100 mg p.o. twice daily.  9)   3. MiraLax 17 g p.o. b.i.d. p.r.n. constipation 10)  take the colace every day.  If one is not enough to keep you regular, take one two times a day. 11)  Take the dulcolax or miralax when you need it.   I would prefer the miralax for you.  You can take a dose every 4-6 hours until you have a bowel movement. 12)  For your ear pain, I don't think its an infection.  Try claritin daily to see if this helps. Prescriptions: K-LOR 20 MEQ PACK (POTASSIUM CHLORIDE) one by mouth two times a day  #60 x 11   Entered and Authorized by:   [name]   Signed by:   [name] on 09/30/2010   Method used:   [facility]  [address] [id number]       [address]       [place], [place]  [address]       Ph: [phone] or [phone]       Fax: [phone]   RxID:   [id number] LASIX 40 MG TABS (FUROSEMIDE) take one by mouth bid  #60 x 11   Entered and Authorized by:   [name]   Signed by:   [name] on 09/30/2010   Method used:   [facility]ically [facility]        [facility]  [address] [facility] ([facility]ail[facility]       [address]       [place], [place]  [address]       Ph: [phone] or [phone]       Fax: [phone]   RxID:   [id number] PRILOSEC 40 MG CPDR (OMEPRAZOLE) one by mouth q day  #30 x 11   Entered and Authorized by:   [name]   Signed by:   [name] on 09/30/2010   Method used:   [facility]ically [facility]        [facility]  [address] [facility] ([facility]       [address]       [place], [place]  [address]       Ph: [phone] or [phone]       Fax: [phone]   RxID:   [id number] GABAPENTIN 600 MG TABS (GABAPENTIN) Take 1 tablet by mouth three times a day and take 2 by mouth at bedtime  #150 x 11   Entered and Authorized by:   [name]   Signed by:   [name] on 09/30/2010   Method used:   [facility]  [facility]  [address] [facility] ([facility]ail[facility]       [address]       [place], [place]  [address]       Ph: [phone] or [phone]       Fax: [phone]   RxID:   [id number] METFORMIN HCL 500 MG TABS  (METFORMIN HCL) Take 1 tablet by mouth two times a day  #60 x 11   Entered and Authorized by:   [name]   Signed by:   [name] on 09/30/2010   Method used:   [facility]        [facility]  [address] [facility]       [address]       [place], [place]  [address]       Ph: [phone] or [phone]       Fax: [phone]   RxID:   [id number] SEROQUEL 25 MG TABS (QUETIAPINE FUMARATE) Take 1 tab by mouth at bedtime  #30 x 11   Entered and Authorized by:   [name]   Signed by:   [name] on 09/30/2010   Method used:   [facility]ically [facility]        [facility]  [address] [facility]       [address]       [place], [place]  [address]       Ph: [phone] or [phone]       Fax: [phone]   RxID:   [id number] ZOFRAN 4 MG TABS (ONDANSETRON HCL) Take 1 tablet by mouth every 6 hours as needed  #30 x 11   Entered and Authorized by:   [name]   Signed by:   [name] on 09/30/2010   Method used:   [facility]        [facility]  [address] [facility]       [address]       [place], [place]  [address]       Ph: [phone] or [phone]       Fax: [phone]   RxID:   [id number] PRODIGY BLOOD GLUCOSE TEST  STRP (GLUCOSE BLOOD) Check CBG two times a day.  Disp QS.  DM, Type II.  ICD 250.00.  #1 x 11   Entered and Authorized by:   [name]   Signed by:   [name] on 09/30/2010   Method used:   [facility]        [facility]  [address] [facility]       [address]       [place], [place]  [address]       Ph: [phone] or [phone]       Fax: [phone]   RxID:   [id number] PRODIGY LANCETS 28G  MISC (LANCETS) Check CBG two times a day.  Disp QS.  DM, Type II, ICD 250.00.  #1 x 11   Entered and Authorized by:   [name]   Signed by:   [name] on 09/30/2010   Method  used:   [facility]        [facility]  [address] [facility]       [address]  [place], [place]  [address]       Ph: [phone] or [phone]       Fax: [phone]   RxID:   [id number] ADVAIR DISKUS 500-50 MCG/DOSE MISC (FLUTICASONE-SALMETEROL) Inhale 1 puff bid  #60 x 12   Entered and Authorized by:   [name]   Signed by:   [name] on 09/30/2010   Method used:   [facility]ronically [facility]        [facility]  [address] [facility]       [address]       [place], [place]  [address]       Ph: [phone] or [phone]       Fax: [phone]   RxID:   [id number] SPIRIVA HANDIHALER 18 MCG CAPS (TIOTROPIUM BROMIDE MONOHYDRATE) 1 inhalation daily  #30 x 12   Entered and Authorized by:   [name]   Signed by:   [name] on 09/30/2010   Method used:   [facility]ically [facility]        [facility]  [address] [facility]       [address]       [place], [place]  [address]       Ph: [phone] or [phone]       Fax: [phone]   RxID:   [id number] TRAZODONE HCL 150 MG TABS (TRAZODONE HCL) 2 tabs po at bedtime  #60 x 12   Entered and Authorized by:   [name]   Signed by:   [name] on 09/30/2010   Method used:   [facility]        [facility]  [address] [facility]       [address]       [place], [place]  [address]       Ph: [phone] or [phone]       Fax: [phone]   RxID:   [id number] TOPIRAMATE 200 MG TABS (TOPIRAMATE) 1 tab by mouth at bedtime  #30 x 12   Entered and Authorized by:   [name]   Signed by:   [name] on 09/30/2010   Method used:   [facility]        [facility]  [address]       [address]       [place], [place]  [address]       Ph: [phone] or [phone]       Fax: [phone]   RxID:   [id number] EFFEXOR XR 150  MG XR24H-CAP (VENLAFAXINE HCL) 2 tabs by mouth daily  #60 x 12   Entered and Authorized by:   [name]   Signed by:   [name] on 09/30/2010   Method used:   [facility]        [facility]  [address]       [address]       [place], [place]  [address]       Ph: [phone] or [phone]       Fax: [phone]   RxID:   [id number] OXYCODONE-ACETAMINOPHEN 5-325 MG TABS (OXYCODONE-ACETAMINOPHEN) take one q6 hours as needed breakthrough pain  #90 x 0  Entered and Authorized by:   [name]   Signed by:   [name] on 09/30/2010   Method used:   Print then Give to Patient   RxID:   [id number] OXYCONTIN 20 MG XR12H-TAB (OXYCODONE HCL) take one  PO  two times a day  #60 x 0   Entered and Authorized by:   [name]   Signed by:   [name] on 09/30/2010   Method used:   Print then Give to Patient   RxID:   [id number] ALPRAZOLAM 1 MG TABS (ALPRAZOLAM) 1 tab by mouth four times a day  #120 x 0   Entered and Authorized by:   [name]   Signed by:   [name] on 09/30/2010   Method used:   Print then Give to Patient   RxID:   [id number] ALPRAZOLAM 1 MG TABS (ALPRAZOLAM) 1 tab by mouth four times a day  #120 x 0   Entered and Authorized by:   [name]   Signed by:   [name] on 09/30/2010   Method used:   Print then Give to Patient   RxID:   [id number] ALPRAZOLAM 1 MG TABS (ALPRAZOLAM) 1 tab by mouth four times a day  #120 x 0   Entered and Authorized by:   [name]   Signed by:   [name] on 09/30/2010   Method used:   Print then Give to Patient   RxID:   [id number] OXYCODONE-ACETAMINOPHEN 5-325 MG TABS (OXYCODONE-ACETAMINOPHEN) take one q6 hours as needed breakthrough pain  #90 x 0   Entered and Authorized by:   [name]   Signed by:   [name] on 09/30/2010   Method used:   Print then Give to  Patient   RxID:   [id number] OXYCONTIN 20 MG XR12H-TAB (OXYCODONE HCL) take one  PO  two times a day  #60 x 0   Entered and Authorized by:   [name]   Signed by:   [name] on 09/30/2010   Method used:   Print then Give to Patient   RxID:   [id number] OXYCODONE-ACETAMINOPHEN 5-325 MG TABS (OXYCODONE-ACETAMINOPHEN) take one q6 hours as needed breakthrough pain  #90 x 0   Entered and Authorized by:   [name]   Signed by:   [name] on 09/30/2010   Method used:   Print then Give to Patient   RxID:   [id number] OXYCONTIN 20 MG XR12H-TAB (OXYCODONE HCL) take one  PO  two times a day  #60 x 0   Entered and Authorized by:   [name]   Signed by:   [name] on 09/30/2010   Method used:   Print then Give to Patient   RxID:   [id number] GABAPENTIN 600 MG TABS (GABAPENTIN) Take 1 tablet by mouth three times a day and take 2 by mouth at bedtime  #150 x 11   Entered and Authorized by:   [name]   Signed by:   [name] on 09/30/2010   Method used:   Electronically to        [name] (retail)       [address]       [place], [place]  [address]       Ph: [phone]  Fax: [phone]   RxID:   [id number]   Prevention & Chronic Care Immunizations   Influenza vaccine: Fluvax MCR  (09/16/2008)   Influenza vaccine due: 10/04/2008    Tetanus booster: 09/20/2003: Done.   Tetanus booster due: 09/19/2013    Pneumococcal vaccine: Done.  (10/21/2003)   Pneumococcal vaccine due: None    H. zoster vaccine: Not documented  Colorectal Screening   Hemoccult: Not documented    Colonoscopy: Not documented   Colonoscopy action/deferral: Not indicated  (09/30/2010)  Other Screening   Pap smear: Done.  s/p hysto, but had cervical cancer, so needs yearly  (11/22/2004)   Pap smear action/deferral: Not indicated S/P hysterectomy  (09/30/2010)   Pap smear due:  11/22/2004    Mammogram: Done.  (03/20/2005)   Mammogram due: 03/20/2006    DXA bone density scan: Not documented   Smoking status: quit  (09/30/2010)  Diabetes Mellitus   HgbA1C: 5.1  (11/10/2009)   HgbA1C action/deferral: Ordered  (09/30/2010)   Hemoglobin A1C due: 04/27/2008    Eye exam: Not documented    Foot exam: Not documented   Foot exam action/deferral: Do today   High risk foot: Yes  (09/30/2010)   Foot care education: Done  (09/30/2010)    Urine microalbumin/creatinine ratio: Not documented    Diabetes flowsheet reviewed?: Yes   Progress toward A1C goal: At goal    Stage of readiness to change (diabetes management): Action  Lipids   Total Cholesterol: 117  (06/26/2009)   LDL: 40  (06/26/2009)   LDL Direct: 47  (05/11/2007)   HDL: 54  (06/26/2009)   Triglycerides: 115  (06/26/2009)    SGOT (AST): 15  (06/26/2009)   SGPT (ALT): 10  (06/26/2009)   Alkaline phosphatase: 43  (06/26/2009)   Total bilirubin: 0.3  (06/26/2009)  Hypertension   Last Blood Pressure: 112 / 78  (09/30/2010)   Serum creatinine: 0.44  (11/10/2009)   Serum potassium 4.1  (11/10/2009)  Self-Management Support :   Personal Goals (by the next clinic visit) :     Personal A1C goal: 7  (11/10/2009)     Personal blood pressure goal: 140/90  (11/10/2009)     Personal LDL goal: 70  (11/10/2009)    Diabetes self-management support: Not documented    Hypertension self-management support: Not documented    Lipid self-management support: Not documented    Nursing Instructions: Diabetic foot exam today

## 2011-01-19 NOTE — Progress Notes (Signed)
  Phone Note Call from Patient   Caller: Daughter-[name] Call For: [phone] Summary of Call: Daughter need to see if she can be seen for congestion. Initial call taken by: [name],  November 23, 2010 3:21 PM  Follow-up for Phone Call        Spoke with pt's daughter- she states she is congested, coughing up green phlegm.  States she would like her to be seen.  Offered her an appt for tomorrow morning, pt's daughter states she cannot bring pt tomorrow because she missed work today to take her dr appts.  She requests med be called in- abx and/or prednisone.  Advised pt must be seen before meds can be rx'd.  Advised she can take pt to UC to be seen today- pt's daughter agreeable.  Follow-up by: Rochele Pages RN,  November 23, 2010 3:35 PM

## 2011-01-19 NOTE — Progress Notes (Signed)
  Phone Note Call from Patient   Caller: Daughter-[name] Call For: [phone] Summary of Call: Took Ms. Morganti to Urgent Care Wed.  Was prescribed Prednisone and Doxycycline.  Want primary to call in a cough medicine to pharmacy.  [facility] [address] Would also like for Dr. Hulen Luster to request her records from Urgent Care.  Was told that they couldn't just send records, physician have to request. Initial call taken by: [name],  November 27, 2010 3:30 PM    New/Updated Medications: TESSALON PERLES 100 MG CAPS (BENZONATATE) take one q 4-6 hours as needed cough Prescriptions: TESSALON PERLES 100 MG CAPS (BENZONATATE) take one q 4-6 hours as needed cough  #15 x 1   Entered and Authorized by:   [name]   Signed by:   [name] on 11/28/2010   Method used:   Electronically to        [facility] (retail)       [address]       [place], [place]  [address]       Ph: [phone]       Fax: [phone]   RxID:   [id number]

## 2011-01-19 NOTE — Letter (Signed)
Summary: Generic Letter  [facility]  [address]   [place], [place] [address]   Phone: [phone]  Fax: [phone]    01/12/2010  [name] [address] [place], [place]  [address]  Please give nasal saline as needed. Please discontinue Zofran.  Sincerely,    Romero Belling MD

## 2011-01-19 NOTE — Progress Notes (Signed)
Summary: Rx Req  Phone Note Call from Patient Call back at [phone]   Caller: Barbra Sarks Summary of Call: Pt needs rx for shower chair to be faxed to [facility] [phone]. Initial call taken by: Clydell Hakim,  October 15, 2010 9:01 AM  Follow-up for Phone Call        done Follow-up by: [name],  October 16, 2010 1:47 PM

## 2011-01-19 NOTE — Miscellaneous (Signed)
Summary: Removing old acute medications.  Clinical Lists Changes  Medications: Removed medication of OXYCONTIN 20 MG XR12H-TAB (OXYCODONE HCL) Take 1 tablet by mouth every 12 hours Removed medication of OXYCODONE HCL 5 MG TABS (OXYCODONE HCL) Take 1 tablet by mouth every 4 hours as needed Removed medication of AVELOX 400 MG TABS (MOXIFLOXACIN HCL) 1 tab by mouth daily x7 days Changed medication from KLONOPIN 1 MG TABS (CLONAZEPAM) Take 1 tablet by mouth four times a day to ALPRAZOLAM 1 MG TABS (ALPRAZOLAM) 1 tab by mouth four times a day

## 2011-01-19 NOTE — Assessment & Plan Note (Signed)
Summary: f/u,df   Vital Signs:  Patient profile:   70 year old female Temp:     97.9 degrees F oral Pulse rate:   103 / minute BP sitting:   129 / 83 Cuff size:   regular  Vitals Entered By: Loralee Pacas CMA (January 12, 2010 4:03 PM) Comments daughter is questioning whether or not pt should go back on coumidin therapy, she's had headachsx 2 months, and states that the pt is requesting zofran more often and is concerned about this   Primary Care Provider:  Romero Belling MD   History of Present Illness: PULMONARY NODULE.  Noted on CT during prior hospitalization.  Increased labor of breathing at times.  URI SYMPTOMS.  Nasal congestion and couh for 2 weeks.  Has baseline dyspnea and O2 requirement, which has increased from 2L to 3L.  DVT/PE.  Decreased swelling.  Constant dyspnea, essentially at baseline except for some increased WOB at times.  No anticoagulation per [facility] vascular--had retroperitoneal bleed.  Has IVC filter.  Keeping legs elevated.  ANTICOAGULATION.  Physician at ALF recommends anticoagulation.  Wants to know if it is a good idea.  NAUSEA.  Has been taking Zofran two times a day x2 months.  Also on PPI two times a day.  Deneis melena, hematochezia.  HEADACHES.  Daily x2 months.  FOOT PROBLEMS.  Recently evaluated at [facility] for foot drop and bunion on RIGHT foot.  They are forwarding notes.  Current Medications (verified): 1)  Pantoprazole Sodium 40 Mg Tbec (Pantoprazole Sodium) .Marland Kitchen.. 1 Tab By Mouth Two Times A Day 2)  Aspirin 325 Mg Tabs (Aspirin) .Marland Kitchen.. 1 Tab By Mouth Daily 3)  Docusate Sodium 100 Mg Tabs (Docusate Sodium) .Marland Kitchen.. 1 Tab By Mouth Daily 4)  Effexor Xr 150 Mg Xr24h-Cap (Venlafaxine Hcl) .... 2 Tabs By Mouth Daily 5)  Topiramate 200 Mg Tabs (Topiramate) .Marland Kitchen.. 1 Tab By Mouth At Bedtime 6)  Trazodone Hcl 150 Mg Tabs (Trazodone Hcl) .... 2 Tabs Po At Bedtime 7)  Spiriva Handihaler 18 Mcg Caps (Tiotropium Bromide Monohydrate) .Marland Kitchen.. 1  Inhalation Daily 8)  Advair Diskus 500-50 Mcg/dose Misc (Fluticasone-Salmeterol) .... Inhale 1 Puff Bid 9)  Gabapentin 400 Mg Caps (Gabapentin) .Marland Kitchen.. 1 Tab By Mouth Three Times A Day 10)  Prodigy Blood Glucose Monitor  Devi (Blood Glucose Monitoring Suppl) .... Dispense #1 For Glucose Monitoring. 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 Check Cbg Two Times A Day.  Disp Qs.  Dm, Type Ii, Icd 250.00. 12)  Prodigy Blood Glucose Test  Strp (Glucose Blood) .... Check Cbg Two Times A Day.  Disp Qs.  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 13)  Zofran 4 Mg Tabs (Ondansetron Hcl) .Marland Kitchen.. 1 Tab By Mouth Every 6 Hours As Needed For Nausea 14)  Ensure  Liqd (Nutritional Supplements) .Marland Kitchen.. 1 Can Three Times A Day With Meals 15)  Dulcolax 5 Mg Tbec (Bisacodyl) .Marland Kitchen.. 1 Tab By Mouth As Needed If No Bowel Movement For 3 Days 16)  Vicodin 5-500 Mg Tabs (Hydrocodone-Acetaminophen) .... 2 Tablets By Mouth Every 6 Hours For Leg Pain 17)  Alprazolam 1 Mg Tabs (Alprazolam) .Marland Kitchen.. 1 Tab By Mouth 4 Times Per Day  Allergies (verified): 1)  ! 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  Orders: [facility]- Est  Level 4 (45409)  Problem # 2:  COPD (ICD-496) Assessment: Deteriorated Given increased cough, will do short course of Doxycycline, though doubt exacerbation based on exam.  Her updated medication list for this problem includes:    Spiriva Handihaler 18 Mcg Caps (Tiotropium bromide monohydrate) .Marland Kitchen... 1 inhalation daily    Advair Diskus 500-50 Mcg/dose Misc  (Fluticasone-salmeterol) ..... 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  Seems to be resolving.  Orders: [facility]- Est  Level 4 (81191)  Problem # 4:  HEADACHE (ICD-784.0) Assessment: New  Possibly secondary to Zofran.  D/c Zofran. Her updated medication list for this problem includes:    Aspirin 325 Mg Tabs (Aspirin) .Marland Kitchen... 1 tab by mouth daily    Vicodin 5-500 Mg Tabs (Hydrocodone-acetaminophen) .Marland Kitchen... 2 tablets by mouth every 6 hours for leg pain  Orders: [facility]- Est  Level 4 (47829)  Problem # 5:  NAUSEA (ICD-787.02) Assessment: New  Unclear if she still needs Zofran.  Have d/c'd will follow.  Orders: [facility]- Est  Level 4 (56213)  Complete Medication List: 1)  Pantoprazole Sodium 40 Mg Tbec (Pantoprazole sodium) .Marland Kitchen.. 1 tab by mouth two times a day 2)  Aspirin 325 Mg Tabs (Aspirin) .Marland Kitchen.. 1 tab by mouth daily 3)  Docusate Sodium 100 Mg Tabs (Docusate sodium) .Marland Kitchen.. 1 tab by mouth daily 4)  Effexor Xr 150 Mg Xr24h-cap (Venlafaxine hcl) .... 2 tabs by mouth daily 5)  Topiramate 200 Mg Tabs (Topiramate) .Marland Kitchen.. 1 tab by mouth at bedtime 6)  Trazodone Hcl 150 Mg Tabs (Trazodone hcl) .... 2 tabs po at bedtime 7)  Spiriva Handihaler 18 Mcg Caps (Tiotropium bromide monohydrate) .Marland Kitchen.. 1 inhalation daily 8)  Advair Diskus 500-50 Mcg/dose Misc (Fluticasone-salmeterol) .... Inhale 1 puff bid 9)  Gabapentin 400 Mg Caps (Gabapentin) .Marland Kitchen.. 1 tab by mouth three times a day 10)  Prodigy Blood Glucose Monitor Devi (Blood glucose monitoring suppl) .... Dispense #1 for glucose monitoring. dm, type ii.  icd 250.00 11)  Prodigy Lancets 28g Misc (Lancets) .... Check cbg two times a day.  disp qs.  dm, type ii, icd 250.00. 12)  Prodigy Blood Glucose Test Strp (Glucose blood) .... Check cbg two times a day.  disp qs.  dm, type ii.  icd 250.00. 13)  Zofran 4 Mg Tabs (Ondansetron hcl) .Marland Kitchen.. 1 tab by mouth every 6 hours as needed for  nausea 14)  Ensure Liqd (Nutritional supplements) .Marland Kitchen.. 1 can three times a day with meals 15)  Dulcolax 5 Mg Tbec (Bisacodyl) .Marland Kitchen.. 1 tab by mouth as needed if no bowel movement for 3 days 16)  Vicodin 5-500 Mg Tabs (Hydrocodone-acetaminophen) .... 2 tablets by mouth every 6 hours for leg pain 17)  Alprazolam 1 Mg Tabs (Alprazolam) .Marland Kitchen.. 1 tab by mouth 4 times per day  Other Orders: Pulmonary Referral (Pulmonary)  Patient Instructions: 1)  We are setting up an appointment with the pulmonologists. 2)  I have written a presription for Doxycycline. 3)  Stop taking Zofran. 4)  Use Nasal Saline as needed.  Appended Document: f/u,df    Clinical Lists Changes  Medications: Added new medication of DOXYCYCLINE HYCLATE 100 MG CAPS (DOXYCYCLINE HYCLATE) 1 cap by mouth two times a day x7 days -  Signed Added new medication of SEA SOFT NASAL MIST 0.65 % SOLN (SALINE) Use in nose bilateral as needed for nasal congestion - Signed Removed medication of ZOFRAN 4 MG TABS (ONDANSETRON HCL) 1 tab by mouth every 6 hours as needed for nausea Rx of DOXYCYCLINE HYCLATE 100 MG CAPS (DOXYCYCLINE HYCLATE) 1 cap by mouth two times a day x7 days;  #14 x 0;  Signed;  Entered by: Romero Belling MD;  Authorized by: Romero Belling MD;  Method used: Print then Give to Patient Rx of SEA SOFT NASAL MIST 0.65 % SOLN (SALINE) Use in nose bilateral as needed for nasal congestion;  #1 x 3;  Signed;  Entered by: Romero Belling MD;  Authorized by: Romero Belling MD;  Method used: Print then Give to Patient    Prescriptions: SEA SOFT NASAL MIST 0.65 % SOLN (SALINE) Use in nose bilateral as needed for nasal congestion  #1 x 3   Entered and Authorized by:   Romero Belling MD   Signed by:   Romero Belling MD on 01/12/2010   Method used:   Print then Give to Patient   RxID:   [id number] DOXYCYCLINE HYCLATE 100 MG CAPS (DOXYCYCLINE HYCLATE) 1 cap by mouth two times a day x7 days  #14 x 0   Entered and Authorized by:   Romero Belling MD   Signed by:   Romero Belling MD on 01/12/2010   Method used:   Print then Give to Patient   RxID:   [id number]    Appended Document: f/u,df    Clinical Lists Changes  Observations: Added new observation of HTN PROGRESS: At goal (01/13/2010 0:05) Added new observation of HTN FSREVIEW: Yes (01/13/2010 0:05) Added new observation of LIPID PROGRS: At goal (01/13/2010 0:05) Added new observation of LIPID FSREVW: Yes (01/13/2010 0:05) Added new observation of DM PROGRESS: At goal (01/13/2010 0:05) Added new observation of DM FSREVIEW: Yes (01/13/2010 0:05)       Prevention & Chronic Care Immunizations   Influenza vaccine: Fluvax MCR  (09/16/2008)   Influenza vaccine due: 10/04/2008    Tetanus booster: 09/20/2003: Done.   Tetanus booster due: 09/19/2013    Pneumococcal vaccine: Done.  (10/21/2003)   Pneumococcal vaccine due: None    H. zoster vaccine: Not documented  Colorectal Screening   Hemoccult: Not documented    Colonoscopy: Not documented  Other Screening   Pap smear: Done.  s/p hysto, but had cervical cancer, so needs yearly  (11/22/2004)   Pap smear due: 11/22/2004    Mammogram: Done.  (03/20/2005)   Mammogram due: 03/20/2006    DXA bone density scan: Not documented   Smoking status: quit  (10/06/2009)  Diabetes Mellitus   HgbA1C: 5.1  (11/10/2009)   Hemoglobin A1C due: 04/27/2008    Eye exam: Not documented    Foot exam: Not documented   High risk foot: Not documented   Foot care education: Not documented    Urine microalbumin/creatinine ratio: Not documented    Diabetes flowsheet reviewed?: Yes   Progress toward A1C goal: At goal  Lipids   Total Cholesterol: 117  (06/26/2009)   LDL: 40  (06/26/2009)   LDL Direct: 47  (05/11/2007)   HDL: 54  (06/26/2009)   Triglycerides: 115  (06/26/2009)    SGOT (AST): 15  (06/26/2009)   SGPT (ALT): 10  (06/26/2009)   Alkaline phosphatase: 43  (06/26/2009)   Total bilirubin: 0.3   (06/26/2009)    Lipid flowsheet reviewed?: Yes   Progress toward LDL  goal: At goal  Hypertension   Last Blood Pressure: 129 / 83  (01/12/2010)   Serum creatinine: 0.44  (11/10/2009)   Serum potassium 4.1  (11/10/2009)    Hypertension flowsheet reviewed?: Yes   Progress toward BP goal: At goal  Self-Management Support :   Personal Goals (by the next clinic visit) :     Personal A1C goal: 7  (11/10/2009)     Personal blood pressure goal: 140/90  (11/10/2009)     Personal LDL goal: 70  (11/10/2009)    Diabetes self-management support: Not documented    Hypertension self-management support: Not documented    Lipid self-management support: Not documented

## 2011-01-19 NOTE — Progress Notes (Signed)
Summary: Rx Req  Phone Note Call from Patient Call back at [phone]   Caller: Daughter- Summary of Call: Had been seen a couple of weeks ago and talked to Dr. Hulen Luster about an ear problem and a bump on inner thigh.  The ear and the bump are no better.  Wondering if she can call in something that would cover both problems? [facility] [address] Also can she up her Trazodone by one pill, if so will need new rx for this. Initial call taken by: Clydell Hakim,  October 12, 2010 10:40 AM  Follow-up for Phone Call        sent trazadone, and ear drops.  I didnt think the bump looked infected.  If daughter thinks it does, she may need to come in to have it drained.  otherwise if it is open she can use an antibiotic ointment Follow-up by: [name],  October 13, 2010 2:38 PM  Additional Follow-up for Phone Call Additional follow up Details #1::        Left messgae on vm for pt daughter to return call. Additional Follow-up by: [name],  October 16, 2010 2:26 PM    Additional Follow-up for Phone Call Additional follow up Details #2::    Tried calling agian left message on machine for daughter to return call. Follow-up by: [name],  October 19, 2010 11:58 AM  Additional Follow-up for Phone Call Additional follow up Details #3:: Details for Additional Follow-up Action Taken: Left another message for pt daughter, informed of above message from MD. Additional Follow-up by: [name],  October 20, 2010 9:39 AM  New/Updated Medications: TRAZODONE HCL 150 MG TABS (TRAZODONE HCL) 3 tabs po at bedtime TRAZODONE HCL 50 MG TABS (TRAZODONE HCL) take one with usual trazadone dose for 7 days, then take 2 with dose for 7 days, then take 3 of 150mg  tabs ANTIPYRINE-BENZOCAINE 5.4-1.4 % SOLN (BENZOCAINE-ANTIPYRINE) 2-4 drops in affected ear QID   as needed pain Prescriptions: ANTIPYRINE-BENZOCAINE 5.4-1.4 % SOLN (BENZOCAINE-ANTIPYRINE) 2-4 drops in affected ear QID   as  needed pain  #1 x 1   Entered and Authorized by:   [name]   Signed by:   [name] on 10/13/2010   Method used:   Electronically to        [facility] (retail)       [address]       [place], [place]  [address]       Ph: [phone]       Fax: [phone]   RxID:   [id number] TRAZODONE HCL 150 MG TABS (TRAZODONE HCL) 3 tabs po at bedtime  #90 x 1   Entered and Authorized by:   [name]   Signed by:   [name] on 10/13/2010   Method used:   Electronically to        [facility] (retail)       [address]       [place], [place]  [address]       Ph: [phone]       Fax: [phone]   RxID:   [id number] TRAZODONE HCL 50 MG TABS (TRAZODONE HCL) take one with usual trazadone dose for 7 days, then take 2 with dose for 7 days, then take 3 of 150mg  tabs  #21 x  0   Entered and Authorized by:   [name]   Signed by:   [name] on 10/13/2010   Method used:   Electronically to        [facility] (retail)       [address]       [place], [place]  [address]       Ph: [phone]       Fax: [phone]   RxID:   [id number]

## 2011-01-19 NOTE — Progress Notes (Signed)
Summary: PT continuation orders  Phone Note From Other Clinic   Caller: Amy, PT with [facility] [phone] Summary of Call: Amy with [facility] needs orders for continuation of Physical Therapy.  Please call with orders to [phone]. Initial call taken by: Terese Door,  November 04, 2010 5:36 PM  Follow-up for Phone Call        Phone call completed left VM to continue PT  Follow-up by: [name],  November 05, 2010 3:29 PM

## 2011-01-19 NOTE — Miscellaneous (Signed)
Summary: refer to [facility]  Clinical Lists Changes  Orders: Added new Referral order of Vascular Clinic (Vascular) - Signed

## 2011-01-19 NOTE — Miscellaneous (Signed)
Summary: PET Scan, 2D Echo  Per Dr. Teddy Spike recommendations:  - 2D Echo -- evaluate pulmonary artery pressure  - V/Q scan -- evaluate re-perfusion of PE  Patient will also need new pulmonology referral as she has been dismissed from [facility].  Orders Placed:  - 2D Echo  - V/Q scan  - Pulmonology referral  Appended Document: PET Scan, 2D Echo

## 2011-01-19 NOTE — Progress Notes (Signed)
Summary: meds  Phone Note Call from Patient Call back at Home Phone [phone]   Caller: Daughter-Melodie Summary of Call: coughing up yellow mucus - now doing PET scan - has a lot of appts this week and wants to know if meds can be [facility] Initial call taken by: De Nurse,  February 09, 2010 10:27 AM  Follow-up for Phone Call        left message for dtr that her mom will need an appt. to call back & make appt Follow-up by: Golden Circle RN,  February 09, 2010 10:45 AM  Additional Follow-up for Phone Call Additional follow up Details #1::        states "she was kicked out of the therapy" explained that she had plateued. has severe foot drop. unable to do any therapy. will be going to be moving in with dtr. currently at [facility].  they are not helpful at taking pts to md appts so dtr has to do it. no family support either. dtr is atretched to get her in due to her job. asks that md call antibiotic in uses [facility] [address] Additional Follow-up by: Golden Circle RN,  February 09, 2010 10:51 AM    Additional Follow-up for Phone Call Additional follow up Details #2::    Avelox sent to [facility].  MUST be seen if not better in 2 days. Follow-up by: Romero Belling MD,  February 09, 2010 11:36 AM  Additional Follow-up for Phone Call Additional follow up Details #3:: Details for Additional Follow-up Action Taken: lm Additional Follow-up by: Golden Circle RN,  February 09, 2010 11:48 AM  New/Updated Medications: AVELOX 400 MG TABS (MOXIFLOXACIN HCL) 1 tab by mouth daily x7 days Prescriptions: AVELOX 400 MG TABS (MOXIFLOXACIN HCL) 1 tab by mouth daily x7 days  #14 x 0   Entered and Authorized by:   Romero Belling MD   Signed by:   Romero Belling MD on 02/09/2010   Method used:   Electronically to        [facility] (retail)       [address]       [place], [place]  [address]       Ph: [phone]       Fax:  [phone]   RxID:   [id number]

## 2011-01-19 NOTE — Progress Notes (Signed)
  Phone Note Call from Patient   Caller: Daughter Call For: [phone] Summary of Call: Still have discoloration to mucous.  Unable to come back to see physician due to daughter not able to take off work.  Want something called in to pharmacy for cold.  Also the rx  for potassium was denied by insurance because rx was for packets.  Have to have rx for actual tablets. Initial call taken by: [name],  November 24, 2010 4:38 PM  Follow-up for Phone Call        pt should be seen. no abx over the phone.  [facility] will be open after hours.  pt should go there if there is a concern and she cannot see [place].  potassium changed to tabs, sent to pharmacy. Follow-up by: [name],  November 25, 2010 3:31 PM

## 2011-01-19 NOTE — Miscellaneous (Signed)
Summary: Chart Summary  Complicated patient.  Multiple pulmonary issues, complicated by psychiatric issues.  Dismissed from [facility].  Apparently being seen now at [facility], though last note is from 02/2010.

## 2011-01-19 NOTE — Miscellaneous (Signed)
Summary: PET Scan Order  Have read Dr. Teddy Spike note from pulmonology.  He recommends PET scan to f/u RML nodule.  Will place order now.  BLUE TEAM:  Please schedule PET scan and call patient.  PET SCAN INDICATION:  Evaluate RML nodule in former smoker.  Romero Belling MD  January 22, 2010 9:11 PM Appt. made, daughter notified. Starleen Blue RN  January 23, 2010 10:58 AM

## 2011-01-21 NOTE — Letter (Signed)
Summary: Generic Letter  [facility]  [address]   [place], [place] [address]   Phone: [phone]  Fax: [phone]    01/13/2011  [address] [place], [place]  [address]  Dear [name],  We are happy to let you know that since you are covered under Medicare you are able to have a FREE visit at the [facility] to discuss your HEALTH. This is a new benefit for Medicare.  There will be no co-payment.  At this visit you will meet with [name] an expert in wellness and the health coach at our clinic.  At this visit we will discuss ways to keep you healthy and feeling well.  This visit will not replace your regular doctor visit and we cannot refill medications.     You will need to plan to be here at least one hour to talk about your medical history, your current status, review all of your medications, and discuss your future plans for your health.  This information will be entered into your record for your doctor to have and review.  If you are interested in staying healthy, this type of visit can help.  Please call the office at: [phone], to schedule a "Medicare Wellness Visit".  The day of the visit you should bring in all of your medications, including any vitamins, herbs, over the counter products you take.  Make a list of all the other doctors that you see, so we know who they are. If you have any other health documents please bring them.  We look forward to helping you stay healthy.  Sincerely,   [name]  i[facility]

## 2011-01-21 NOTE — Progress Notes (Signed)
  Phone Note Call from Patient   Caller: Daughter-[name] Call For: [phone] Summary of Call: Moisure have created a yeast in the lower flap of abd.  Do not have refill on her nyastatin cream .  [facility] [address] [facility]  Follow-up for Phone Call        done. please let pt know Follow-up by: [name],  December 10, 2010 11:29 AM    New/Updated Medications: PRILOSEC 40 MG CPDR (OMEPRAZOLE) one by mouth BID KETOCONAZOLE 2 % CREA (KETOCONAZOLE) apply to affected area disp 30 gm tube Prescriptions: PRILOSEC 40 MG CPDR (OMEPRAZOLE) one by mouth BID  #60 x 6   Entered and Authorized by:   [name]   Signed by:   [name] on 12/30/2010   Method used:   [facility]  [address] [id number]       [address]       [place], [place]  [address]       Ph: [phone] or [phone]       Fax: [phone]   RxID:   [id number] KETOCONAZOLE 2 % CREA (KETOCONAZOLE) apply to affected area disp 30 gm tube  #1 x 3   Entered and Authorized by:   [name]   Signed by:   [name] on 12/10/2010   Method used:   [facility]        [facility]  [address] [id number]       [address]       [place], [place]  [address]       Ph: [phone] or [phone]       Fax: [phone]   RxID:   [id number]

## 2011-01-21 NOTE — Progress Notes (Signed)
  Phone Note Call from Patient   Caller: Daughter-[name] Call For: [phone] Summary of Call: [name] calling about request that was faxed several times for med refills.  Pharmacy says they still havent' received orders.  Daughter, [name] wanted to know when this is going to be sent in due to her mom being out of meds now.  Need  it asap.  Please call back to inform when sent.  Need  Klor-clon 20 mg tablets taken two times a day.  Insurance will not pay for the packets.  Need rx sent for tablets. Initial call taken by: [name],  December 07, 2010 11:15 AM  Follow-up for Phone Call        please see refill note from 12/7.  resent today. Follow-up by: [name],  December 07, 2010 11:24 AM  Additional Follow-up for Phone Call Additional follow up Details #1::        Left message with man for [name] to call back. Additional Follow-up by: [name],  December 07, 2010 1:46 PM    Additional Follow-up for Phone Call Additional follow up Details #2::    [name] informed. Follow-up by: [name],  December 08, 2010 10:17 AM  Prescriptions: KLOR-CON 10 10 MEQ CR-TABS (POTASSIUM CHLORIDE) take 2 daily  #60 x 11   Entered and Authorized by:   [name]   Signed by:   [name] on 12/07/2010   Method used:   Electronically to        [facility] (retail)       [address]       [place], [place]  [address]       Ph: [phone]       Fax: [phone]   RxID:   [id number] TRAZODONE HCL 150 MG TABS (TRAZODONE HCL) 3 tabs po at bedtime  #90 x 3   Entered and Authorized by:   [name]   Signed by:   [name] on 12/07/2010   Method used:   Electronically to        [facility] (retail)       [address]       [place], [place]  [address]       Ph: [phone]       Fax: [phone]   RxID:   [id number]

## 2011-01-21 NOTE — Progress Notes (Signed)
Summary: PT recertification  Phone Note Other Incoming   Caller: Amy with Physical Therapy [phone] Action Taken: Phone Call Completed Summary of Call: Amy calling requesting  recertification for Pysical Therapy on [name].  States patient is doing much better.  Able to get around with her walker.  Would like to continue PT for another 5 weeks.  Verbal okay given to continue PT. Initial call taken by: Terese Door,  January 07, 2011 3:41 PM  Follow-up for Phone Call        agreed.  do i need to do anything else? Follow-up by: [name],  January 11, 2011 8:40 AM

## 2011-01-25 ENCOUNTER — Telehealth: Payer: Self-pay | Admitting: Family Medicine

## 2011-01-25 ENCOUNTER — Encounter: Payer: Self-pay | Admitting: Family Medicine

## 2011-01-27 NOTE — Progress Notes (Signed)
Summary: update on pt  Phone Note Call from Patient   Caller: Daughter Summary of Call: Jan 2 - BP 120/88, HR 102, Pulse 16, O2 stat was 94, Jan 4 - BP 130/65, HR 116, Pulse 20, O2 stat was 94, O2 stat was 83 on Jan 2, O2 without oxygen on Jan 4 was 84 Initial call taken by: Knox Royalty,  January 18, 2011 4:41 PM  Follow-up for Phone Call        i have no idea what she would like me to do with this? Follow-up by: [name],  January 19, 2011 8:35 AM  Additional Follow-up for Phone Call Additional follow up Details #1::        I think it was just an FYI. Additional Follow-up by: [name],  January 19, 2011 8:50 AM

## 2011-02-04 NOTE — Progress Notes (Signed)
Summary: needs meds  Phone Note Call from Patient Call back at Home Phone [phone]   Caller: Daughter-[name] Summary of Call: is having constipation due to some of the meds she is on, she was given meds in hosp to help her and needs a refill on both: polyethylene glycol 3350nf powder bifacodyl 10mg  suppositories used only as needed [facility] [address]  Please call pt and let her know that both of these medications can be purchased over the counter at the pharmacy without a prescription.  Ellin Mayhew MD  January 25, 2011 2:14 PM  Initial call taken by: De Nurse,  January 25, 2011 12:18 PM  Follow-up for Phone Call        Patients daughter informed of above however she would like a rx for these meds so that ins will pay. Follow-up by: [name],  January 25, 2011 2:51 PM  Additional Follow-up for Phone Call Additional follow up Details #1::        please let them know that i sent in rx electronically Ellin Mayhew MD  January 26, 2011 2:21 PM     New/Updated Medications: MIRALAX  POWD (POLYETHYLENE GLYCOL 3350) 17 gram by mouth two times a day as needed for constipation DULCOLAX 10 MG SUPP (BISACODYL) use daily as needed for severe constipation Prescriptions: DULCOLAX 10 MG SUPP (BISACODYL) use daily as needed for severe constipation  #10 x 1   Entered and Authorized by:   Ellin Mayhew MD   Signed by:   Ellin Mayhew MD on 01/26/2011   Method used:   [facility]        [facility]  [address] [id number]       [address]       [place], [place]  [address]       Ph: [phone] or [phone]       Fax: [phone]   RxID:   [id number] MIRALAX  POWD (POLYETHYLENE GLYCOL 3350) 17 gram by mouth two times a day as needed for constipation  #30 x 1   Entered and Authorized by:   Ellin Mayhew MD   Signed by:   Ellin Mayhew MD on 01/26/2011   Method used:   [facility]        [facility]  [address] [id number]       [address]       [place], [place]  [address]       Ph: [phone] or [phone]       Fax: [phone]   RxID:   [id number]

## 2011-02-07 ENCOUNTER — Emergency Department (HOSPITAL_COMMUNITY)
Admission: EM | Admit: 2011-02-07 | Discharge: 2011-02-07 | Disposition: A | Discharge status: Home / Self Care | Payer: Medicare Other | Attending: Emergency Medicine | Admitting: Emergency Medicine

## 2011-02-07 ENCOUNTER — Emergency Department (HOSPITAL_COMMUNITY): Payer: Medicare Other

## 2011-02-07 DIAGNOSIS — I872 Venous insufficiency (chronic) (peripheral): Secondary | ICD-10-CM | POA: Insufficient documentation

## 2011-02-07 DIAGNOSIS — J438 Other emphysema: Secondary | ICD-10-CM | POA: Insufficient documentation

## 2011-02-07 DIAGNOSIS — E119 Type 2 diabetes mellitus without complications: Secondary | ICD-10-CM | POA: Insufficient documentation

## 2011-02-07 DIAGNOSIS — F0390 Unspecified dementia without behavioral disturbance: Secondary | ICD-10-CM | POA: Insufficient documentation

## 2011-02-07 DIAGNOSIS — F411 Generalized anxiety disorder: Secondary | ICD-10-CM | POA: Insufficient documentation

## 2011-02-07 DIAGNOSIS — Z79899 Other long term (current) drug therapy: Secondary | ICD-10-CM | POA: Insufficient documentation

## 2011-02-07 DIAGNOSIS — R059 Cough, unspecified: Secondary | ICD-10-CM | POA: Insufficient documentation

## 2011-02-07 DIAGNOSIS — J40 Bronchitis, not specified as acute or chronic: Secondary | ICD-10-CM | POA: Insufficient documentation

## 2011-02-07 DIAGNOSIS — R05 Cough: Secondary | ICD-10-CM | POA: Insufficient documentation

## 2011-02-07 DIAGNOSIS — K219 Gastro-esophageal reflux disease without esophagitis: Secondary | ICD-10-CM | POA: Insufficient documentation

## 2011-02-07 DIAGNOSIS — R0602 Shortness of breath: Secondary | ICD-10-CM | POA: Insufficient documentation

## 2011-02-07 DIAGNOSIS — R000 Tachycardia, unspecified: Secondary | ICD-10-CM | POA: Insufficient documentation

## 2011-02-07 DIAGNOSIS — R0989 Other specified symptoms and signs involving the circulatory and respiratory systems: Secondary | ICD-10-CM | POA: Insufficient documentation

## 2011-02-07 DIAGNOSIS — I10 Essential (primary) hypertension: Secondary | ICD-10-CM | POA: Insufficient documentation

## 2011-02-07 DIAGNOSIS — Z86718 Personal history of other venous thrombosis and embolism: Secondary | ICD-10-CM | POA: Insufficient documentation

## 2011-02-07 DIAGNOSIS — J3489 Other specified disorders of nose and nasal sinuses: Secondary | ICD-10-CM | POA: Insufficient documentation

## 2011-02-07 DIAGNOSIS — R0609 Other forms of dyspnea: Secondary | ICD-10-CM | POA: Insufficient documentation

## 2011-02-07 LAB — BASIC METABOLIC PANEL
Calcium: 8.9 mg/dL (ref 8.4–10.5)
Chloride: 106 mEq/L (ref 96–112)
Creatinine, Ser: 0.6 mg/dL (ref 0.4–1.2)
GFR calc Af Amer: >60 mL/min (ref >60)
GFR calc non Af Amer: >60 mL/min (ref >60)

## 2011-02-07 LAB — CBC
MCH: 30.8 pg (ref 26.0–34.0)
MCHC: 31.7 g/dL (ref 30.0–36.0)
Platelets: 150 10*3/uL (ref 150–400)
RBC: 4.06 MIL/uL (ref 3.87–5.11)
RDW: 13 % (ref 11.5–15.5)

## 2011-02-07 LAB — BRAIN NATRIURETIC PEPTIDE: Pro B Natriuretic peptide (BNP): 44 pg/mL (ref 0.0–100.0)

## 2011-02-07 LAB — DIFFERENTIAL
Basophils Relative: 1 % (ref 0–1)
Eosinophils Absolute: 0.2 10*3/uL (ref 0.0–0.7)
Eosinophils Relative: 6 % — ABNORMAL HIGH (ref 0–5)
Monocytes Relative: 11 % (ref 3–12)
Neutrophils Relative %: 42 % — ABNORMAL LOW (ref 43–77)

## 2011-02-08 ENCOUNTER — Telehealth: Payer: Self-pay | Admitting: Family Medicine

## 2011-02-08 ENCOUNTER — Encounter: Payer: Self-pay | Admitting: *Deleted

## 2011-02-08 ENCOUNTER — Encounter: Payer: Self-pay | Admitting: Surgery

## 2011-02-08 NOTE — Telephone Encounter (Signed)
Pt has been out of oxycontin all weekend because it needs to be prior approved.  Needs something done this am.

## 2011-02-08 NOTE — Progress Notes (Unsigned)
I spoke with Tedd Sias, her new insurance carrier for 2012. They did approve the oxycontin for 1 yr. I called & LM at her home as she had already left work

## 2011-02-08 NOTE — Telephone Encounter (Signed)
Spoke with dtr. She picked the rx up last wk[facility] & would need a prior auth. She will ask the pharmacy to fax the needed info here & we can do the prior auth

## 2011-02-08 NOTE — Telephone Encounter (Signed)
plz fill

## 2011-02-10 NOTE — Miscellaneous (Signed)
Summary: Meds  Clinical Lists Changes  Spoke with patients daughter [name], she states that [name] will be due for a refill on her Oxycodone, Oxycontin, and Xanax in the middle of the month and would like for [place] to call her [phone]) when these are ready for her to pick up. Message to MD.  Medications: Rx of OXYCODONE-ACETAMINOPHEN 5-325 MG TABS (OXYCODONE-ACETAMINOPHEN) take one q6 hours as needed breakthrough pain;  #90 x 0;  Signed;  Entered by: [name];  Authorized by: [name];  Method used: Print then Give to Patient Rx of OXYCONTIN 20 MG XR12H-TAB (OXYCODONE HCL) take one  PO  two times a day;  #60 x 0;  Signed;  Entered by: [name];  Authorized by: [name];  Method used: Print then Give to Patient Rx of ALPRAZOLAM 1 MG TABS (ALPRAZOLAM) 1 tab by mouth four times a day;  #120 x 0;  Signed;  Entered by: [name];  Authorized by: [name];  Method used: Print then Give to Patient Rx of ALPRAZOLAM 1 MG TABS (ALPRAZOLAM) 1 tab by mouth four times a day;  #120 x 0;  Signed;  Entered by: [name];  Authorized by: [name];  Method used: Print then Give to Patient Rx of OXYCONTIN 20 MG XR12H-TAB (OXYCODONE HCL) take one  PO  two times a day;  #60 x 0;  Signed;  Entered by: [name];  Authorized by: [name];  Method used: Print then Give to Patient Rx of OXYCODONE-ACETAMINOPHEN 5-325 MG TABS (OXYCODONE-ACETAMINOPHEN) take one q6 hours as needed breakthrough pain;  #90 x 0;  Signed;  Entered by: [name];  Authorized by: [name];  Method used: Print then Give to Patient    Prescriptions: OXYCODONE-ACETAMINOPHEN 5-325 MG TABS (OXYCODONE-ACETAMINOPHEN) take one q6 hours as needed breakthrough pain  #90 x 0   Entered and Authorized by:   [name]   Signed by:   [name] on 02/02/2011   Method used:   Print then Give to Patient   RxID:   [id number] OXYCONTIN 20  MG XR12H-TAB (OXYCODONE HCL) take one  PO  two times a day  #60 x 0   Entered and Authorized by:   [name]   Signed by:   [name] on 02/02/2011   Method used:   Print then Give to Patient   RxID:   [id number] ALPRAZOLAM 1 MG TABS (ALPRAZOLAM) 1 tab by mouth four times a day  #120 x 0   Entered and Authorized by:   [name]   Signed by:   [name] on 02/02/2011   Method used:   Print then Give to Patient   RxID:   [id number] ALPRAZOLAM 1 MG TABS (ALPRAZOLAM) 1 tab by mouth four times a day  #120 x 0   Entered and Authorized by:   [name]   Signed by:   [name] on 02/02/2011   Method used:   Print then Give to Patient   RxID:   [id number] OXYCONTIN 20 MG XR12H-TAB (OXYCODONE HCL) take one  PO  two times a day  #60 x 0   Entered and Authorized by:   [name]   Signed by:   [name] on 02/02/2011   Method used:   Print then Give to Patient   RxID:   [id number] OXYCODONE-ACETAMINOPHEN 5-325 MG  TABS (OXYCODONE-ACETAMINOPHEN) take one q6 hours as needed breakthrough pain  #90 x 0   Entered and Authorized by:   [name]   Signed by:   [name] on 02/02/2011   Method used:   Print then Give to Patient   RxID:   [id number]

## 2011-02-22 ENCOUNTER — Encounter (INDEPENDENT_AMBULATORY_CARE_PROVIDER_SITE_OTHER): Payer: Medicare Other | Admitting: Surgery

## 2011-02-22 DIAGNOSIS — I824Z9 Acute embolism and thrombosis of unspecified deep veins of unspecified distal lower extremity: Secondary | ICD-10-CM

## 2011-02-22 DIAGNOSIS — M7989 Other specified soft tissue disorders: Secondary | ICD-10-CM

## 2011-02-23 NOTE — Assessment & Plan Note (Signed)
OFFICE VISIT  [name], [name] DOB:  1941/06/08                                       02/22/2011 ZOXWR#:[id number]  REASON FOR VISIT:  Leg swelling and discussion of long term IVC filter.  HISTORY:  This is a 70 year old female I am seeing at the request of Dr. Ellery Plunk for evaluation of leg swelling as well as possible IVC filter removal.  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  The filter has remained in place.  They were concerns over whether or not it could be removed.  [name] biggest complaint to me today is leg swelling which is becoming unresponsive to Lasix and causing her to have significant pain throughout the day and at night.  It is worse when she gets up and walks with the therapy.  The patient has multiple medical comorbidities including limited mobility, diabetes, COPD, hypertension and all the above are medically managed.  REVIEW OF SYSTEMS:  GENERAL:  Positive for weight gain. VASCULAR:  Positive for pain in legs with walking and when lying flat, history of DVT. CARDIAC:  Positive for chest pain and tightness and pressure, shortness of breath when lying flat and with exertion. GI:  Positive for reflux and constipation. PULMONARY:  Positive for hemoptysis and productive cough and wheezing. HEME:  Positive for clotting disorder. GU:  Positive for difficulty urinating. ENT:  With recent change in eyesight. PSYCHIATRIC:  Positive for anxiety. All other review of systems are negative.  PAST MEDICAL HISTORY:  Bipolar disease, history of PE and DVT, anemia, COPD, diabetes, reflux disease, hypertension.  SOCIAL HISTORY:  She is single and disabled.  She has 2 children.  Does not smoke or drink.  FAMILY HISTORY:  Positive for congestive heart failure in her mother as well as stroke.  Her father  had congestive heart failure as well as blood clots.  ALLERGIES:  To Bactrim.  PHYSICAL EXAM:  Vital signs:  Heart rate 105, blood pressure 112/72, O2 saturation 98%.  General:  She is resting comfortably, in no acute distress.  Lungs:  Respirations are nonlabored.  Extremities:  Reveal 3+ pitting edema all the way above the knee.  No ulceration.  Pulses are not palpable likely secondary to her edema.  ASSESSMENT AND PLAN: 1. IVC filter:  With the patient's lack of mobility and her issues in     the past as well as the possibility of a clotting disorder that     runs in the family I would not recommend removal of her IVC filter.     The FDA indications recommend removing these within the first 3     months.  I think that in the right situation removal at 2 years can     be performed with some added risk.  However, in this situation I     feel like the patient would be better served keeping this in place.     I reiterated to her that the removal of filters are also designed     to be permanent filters. 2. Lower extremity swelling:  We  have discussed the potential     etiologies most likely in this case is either venous reflux disease  or lymphedema.  Regardless of the etiology the initial treatment is     going to be compression stockings.  I am giving her a prescription     for thigh-high 20 mm - 30 mm compression stockings.  I am going to     see her back in 2 months.  We will get a venous reflux evaluation     to see if she would be a candidate for laser ablation should venous     reflux be her etiology.  Again, I will see her back in 2 months.    Jorge Ny, MD Electronically Signed  VWB/MEDQ  D:  02/22/2011  T:  02/23/2011  Job:  [id number]  cc:   Ellery Plunk, MD

## 2011-03-01 LAB — URINALYSIS, ROUTINE W REFLEX MICROSCOPIC
Bilirubin Urine: NEGATIVE
Glucose, UA: NEGATIVE mg/dL
Hgb urine dipstick: NEGATIVE
Ketones, ur: NEGATIVE mg/dL
Nitrite: NEGATIVE
Protein, ur: NEGATIVE mg/dL
Specific Gravity, Urine: 1.02 (ref 1.005–1.030)
Urobilinogen, UA: 1 mg/dL (ref 0.0–1.0)
pH: 6.5 (ref 5.0–8.0)

## 2011-03-01 LAB — URINE CULTURE
Colony Count: 6000
Culture  Setup Time: 201112300224

## 2011-03-02 LAB — POCT I-STAT, CHEM 8
BUN: 7 mg/dL (ref 6–23)
Calcium, Ion: 1.19 mmol/L (ref 1.12–1.32)
Chloride: 103 mEq/L (ref 96–112)
Glucose, Bld: 178 mg/dL — ABNORMAL HIGH (ref 70–99)
HCT: 41 % (ref 36.0–46.0)
Potassium: 4.2 mEq/L (ref 3.5–5.1)

## 2011-03-02 LAB — CBC
HCT: 39.4 % (ref 36.0–46.0)
MCV: 95.4 fL (ref 78.0–100.0)
Platelets: 162 10*3/uL (ref 150–400)
RBC: 4.13 MIL/uL (ref 3.87–5.11)
WBC: 5.6 10*3/uL (ref 4.0–10.5)

## 2011-03-03 ENCOUNTER — Ambulatory Visit (INDEPENDENT_AMBULATORY_CARE_PROVIDER_SITE_OTHER): Payer: Medicare Other | Admitting: Family Medicine

## 2011-03-03 VITALS — BP 120/80 | HR 121 | Temp 98.5°F | Wt 191.0 lb

## 2011-03-03 DIAGNOSIS — E119 Type 2 diabetes mellitus without complications: Secondary | ICD-10-CM

## 2011-03-03 DIAGNOSIS — M7989 Other specified soft tissue disorders: Secondary | ICD-10-CM

## 2011-03-03 DIAGNOSIS — F411 Generalized anxiety disorder: Secondary | ICD-10-CM

## 2011-03-03 LAB — DIFFERENTIAL
Basophils Relative: 0 % (ref 0–1)
Lymphs Abs: 2.1 10*3/uL (ref 0.7–4.0)
Monocytes Absolute: 0.3 10*3/uL (ref 0.1–1.0)
Monocytes Relative: 4 % (ref 3–12)
Neutro Abs: 4 10*3/uL (ref 1.7–7.7)
Neutrophils Relative %: 61 % (ref 43–77)

## 2011-03-03 LAB — POCT I-STAT, CHEM 8
BUN: 6 mg/dL (ref 6–23)
Chloride: 106 mEq/L (ref 96–112)
Creatinine, Ser: 0.5 mg/dL (ref 0.4–1.2)
Glucose, Bld: 169 mg/dL — ABNORMAL HIGH (ref 70–99)
Hemoglobin: 13.9 g/dL (ref 12.0–15.0)
Potassium: 4.1 mEq/L (ref 3.5–5.1)
Sodium: 137 mEq/L (ref 135–145)

## 2011-03-03 LAB — CBC
HCT: 37.9 % (ref 36.0–46.0)
Hemoglobin: 12.9 g/dL (ref 12.0–15.0)
MCHC: 34 g/dL (ref 30.0–36.0)
RBC: 4.12 MIL/uL (ref 3.87–5.11)

## 2011-03-03 LAB — HEPATIC FUNCTION PANEL
AST: 20 U/L (ref 0–37)
Albumin: 3.3 g/dL — ABNORMAL LOW (ref 3.5–5.2)
Alkaline Phosphatase: 77 U/L (ref 39–117)
Total Bilirubin: 0.7 mg/dL (ref 0.3–1.2)
Total Protein: 7.1 g/dL (ref 6.0–8.3)

## 2011-03-03 LAB — LIPASE, BLOOD: Lipase: 21 U/L (ref 11–59)

## 2011-03-03 LAB — URINALYSIS, ROUTINE W REFLEX MICROSCOPIC
Bilirubin Urine: NEGATIVE
Glucose, UA: NEGATIVE mg/dL
Hgb urine dipstick: NEGATIVE
Specific Gravity, Urine: 1.03 (ref 1.005–1.030)
pH: 6.5 (ref 5.0–8.0)

## 2011-03-03 LAB — POCT GLYCOSYLATED HEMOGLOBIN (HGB A1C): Hemoglobin A1C: 7.1

## 2011-03-03 MED ORDER — V-4 HIGH COMPRESSION HOSE MISC: 1.0000 [IU] | Freq: Every day | Status: DC

## 2011-03-03 MED ORDER — METFORMIN HCL 500 MG PO TABS: 1000.0000 mg | ORAL_TABLET | Freq: Two times a day (BID) | ORAL | Status: DC

## 2011-03-03 MED ORDER — OXYCODONE-ACETAMINOPHEN 5-325 MG PO TABS: 1.0000 | ORAL_TABLET | Freq: Four times a day (QID) | ORAL | Status: DC | PRN

## 2011-03-03 MED ORDER — ALPRAZOLAM 1 MG PO TABS: 1.0000 mg | ORAL_TABLET | Freq: Four times a day (QID) | ORAL | Status: DC

## 2011-03-03 MED ORDER — OXYCODONE HCL 20 MG PO TB12: 20.0000 mg | ORAL_TABLET | Freq: Two times a day (BID) | ORAL | Status: DC

## 2011-03-03 MED ORDER — TORSEMIDE 20 MG PO TABS: 20.0000 mg | ORAL_TABLET | Freq: Two times a day (BID) | ORAL | Status: DC

## 2011-03-03 NOTE — Progress Notes (Signed)
  Subjective:    Patient ID: [name], female    DOB: [birth year], 70 y.o.   MRN: [id number]  HPI  1. LE swelling:  Her leg swelling has been getting worse.  She is basically wheel-chair bound now and never gets up to walk around.  She has been taking her medications as prescribed.  Bilateral leg swelling is equal.  2. DMII:  She has been taking her Metformin as prescribed.  She has been checking her CBGs and they have been running higher around 200-300.  3. Anxiety:  Using Xanax about 4 times a day.  Anxiety controlled.  Review of Systems  Constitutional: Negative for fever and chills.  HENT: Negative for congestion and facial swelling.   Respiratory: Negative for choking, chest tightness and wheezing.   Cardiovascular: Positive for leg swelling. Negative for chest pain.  Gastrointestinal: Negative for abdominal distention.  Genitourinary: Negative for difficulty urinating.  Musculoskeletal: Negative for arthralgias.  Neurological: Negative for dizziness, weakness, numbness and headaches.       Objective:   Physical Exam  Constitutional:       Obese.  No acute distress.  Sitting in a wheelchair.  HENT:  Head: Normocephalic and atraumatic.  Mouth/Throat: No oropharyngeal exudate.  Eyes: Conjunctivae are normal. Pupils are equal, round, and reactive to light.  Neck: Normal range of motion. Neck supple. No tracheal deviation present. No thyromegaly present.  Cardiovascular: Normal rate and regular rhythm.   Pulmonary/Chest:       Decreased inspiratory capacity.  Clear breath sounds.  Wearing O2.  Normal respiratory effort.  Abdominal: Soft. She exhibits no distension.  Musculoskeletal:       3+ LE edema  Skin: No rash noted.       No ulcers  Psychiatric:       Not anxious appearing.  Unpleasant.          Assessment & Plan:

## 2011-03-03 NOTE — Assessment & Plan Note (Signed)
Not controlled with lasix.  Will switch to Torsemide to see if it is more effective.  This is most likely venous insufficiency and the only thing that will help is leg elevation and compression stockings.  Wrote a Rx for compression stockings.

## 2011-03-03 NOTE — Patient Instructions (Signed)
I have refilled your medications I have switched the medicine from Lasix to Torsemide I am going to increase the Metformin from 500mg  to 1000mg  twice a day Please schedule a follow up appointment in 4 weeks

## 2011-03-03 NOTE — Assessment & Plan Note (Signed)
A1C increased.  Still controlled.  Will increased Metformin to 1000mg  twice a day.

## 2011-03-03 NOTE — Assessment & Plan Note (Signed)
Stable with current medications.  Refill today.  No changes.

## 2011-03-04 LAB — GLUCOSE, CAPILLARY
Glucose-Capillary: 125 mg/dL — ABNORMAL HIGH (ref 70–99)
Glucose-Capillary: 138 mg/dL — ABNORMAL HIGH (ref 70–99)
Glucose-Capillary: 151 mg/dL — ABNORMAL HIGH (ref 70–99)

## 2011-03-04 LAB — BASIC METABOLIC PANEL
CO2: 27 mEq/L (ref 19–32)
Chloride: 107 mEq/L (ref 96–112)
Chloride: 107 mEq/L (ref 96–112)
GFR calc Af Amer: >60 mL/min (ref >60)
GFR calc non Af Amer: >60 mL/min (ref >60)
Glucose, Bld: 169 mg/dL — ABNORMAL HIGH (ref 70–99)
Potassium: 3.5 mEq/L (ref 3.5–5.1)
Potassium: 3.7 mEq/L (ref 3.5–5.1)
Sodium: 139 mEq/L (ref 135–145)

## 2011-03-04 LAB — CBC
Hemoglobin: 11.9 g/dL — ABNORMAL LOW (ref 12.0–15.0)
MCV: 93 fL (ref 78.0–100.0)
Platelets: 182 10*3/uL (ref 150–400)
RBC: 3.99 MIL/uL (ref 3.87–5.11)
WBC: 7 10*3/uL (ref 4.0–10.5)

## 2011-03-04 LAB — HEMOGLOBIN A1C
Hgb A1c MFr Bld: 6.1 % — ABNORMAL HIGH (ref <5.7)
Mean Plasma Glucose: 128 mg/dL — ABNORMAL HIGH (ref <117)

## 2011-03-04 LAB — HEMOCCULT GUIAC POC 1CARD (OFFICE): Fecal Occult Bld: NEGATIVE

## 2011-03-12 IMAGING — CR DG CHEST 2V
2 series · 2 of 2 positions shown · non-contrast
Comparison: 11/25/2010

CLINICAL DATA: Shortness of breath, cough

CHEST - 2 VIEW

[w chest lat]
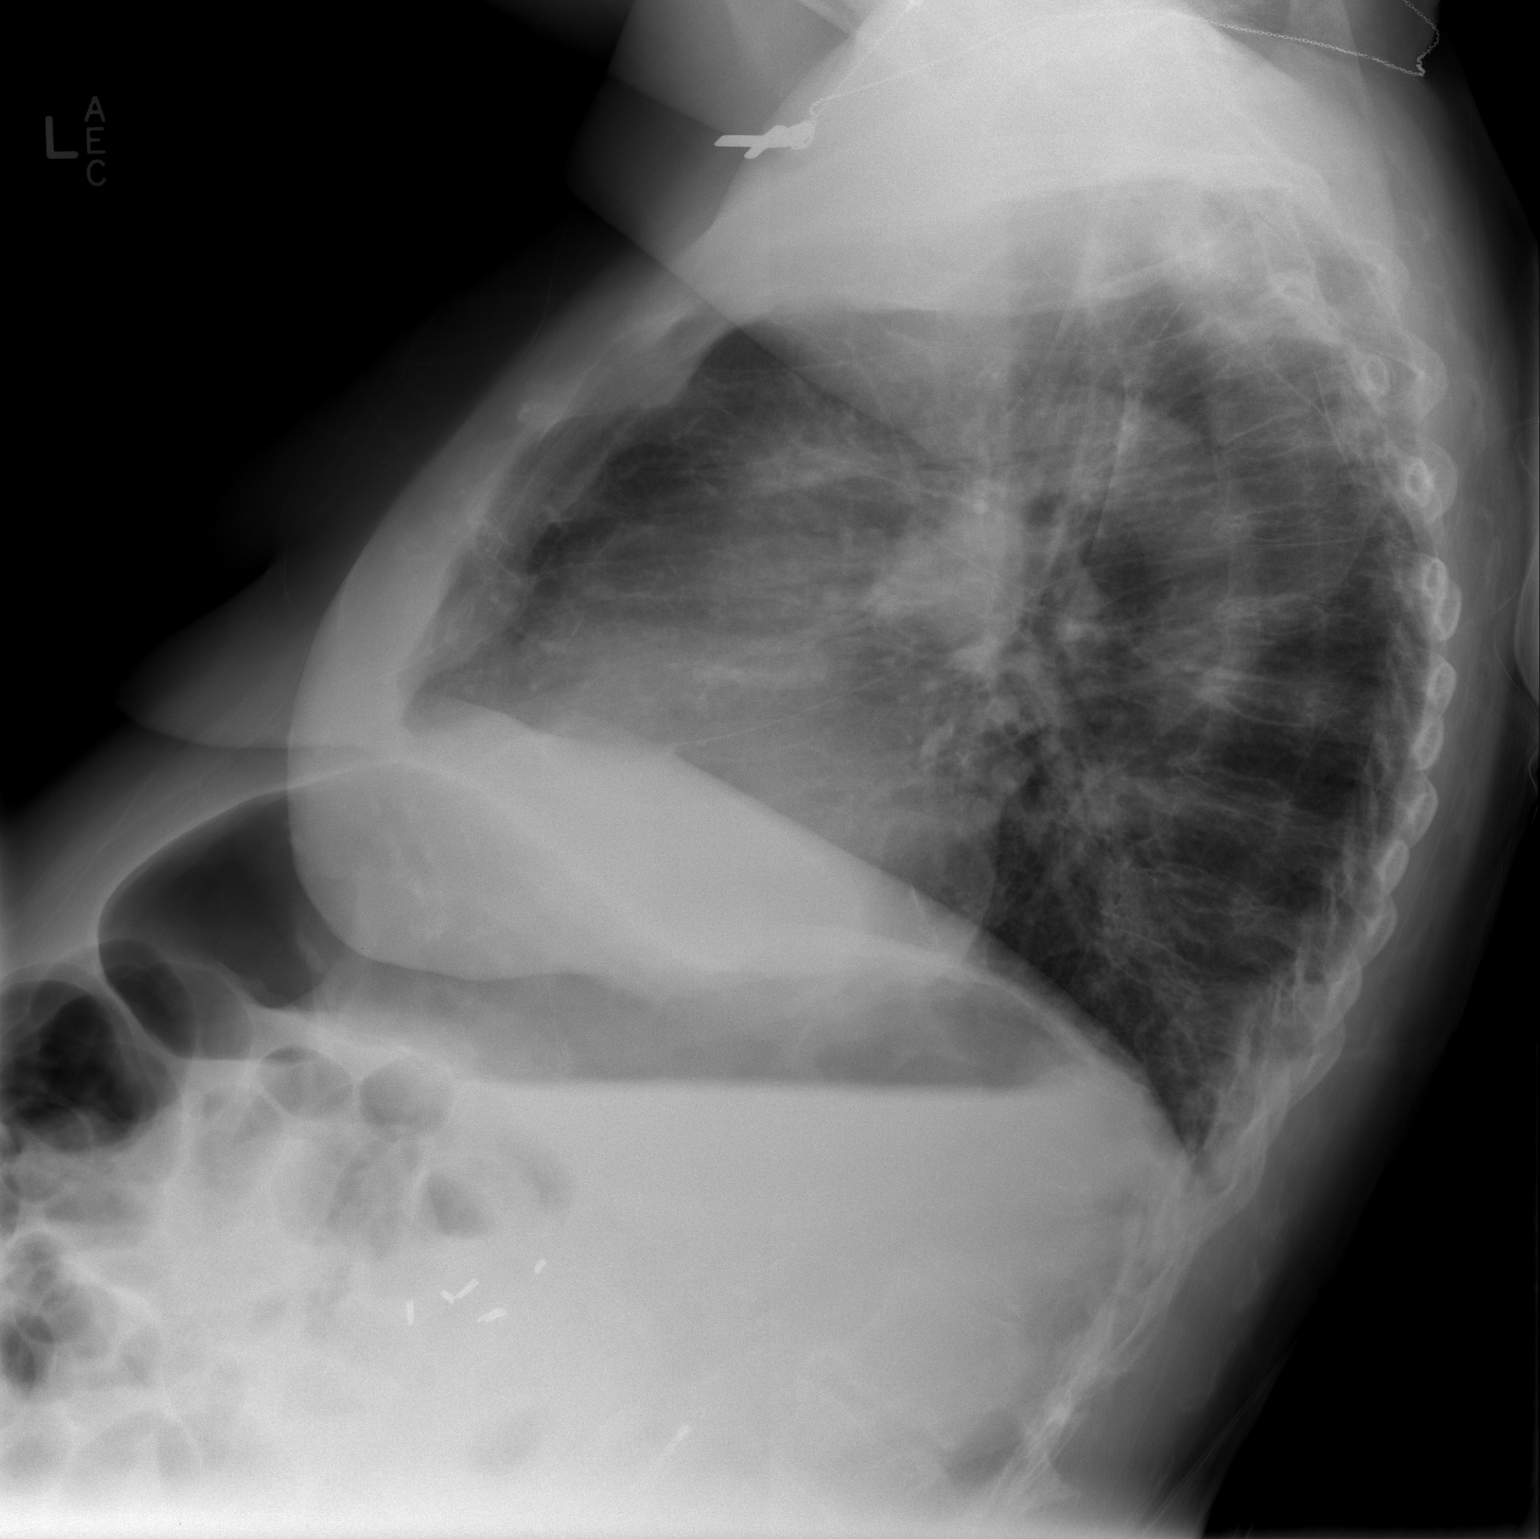

[view not recorded]
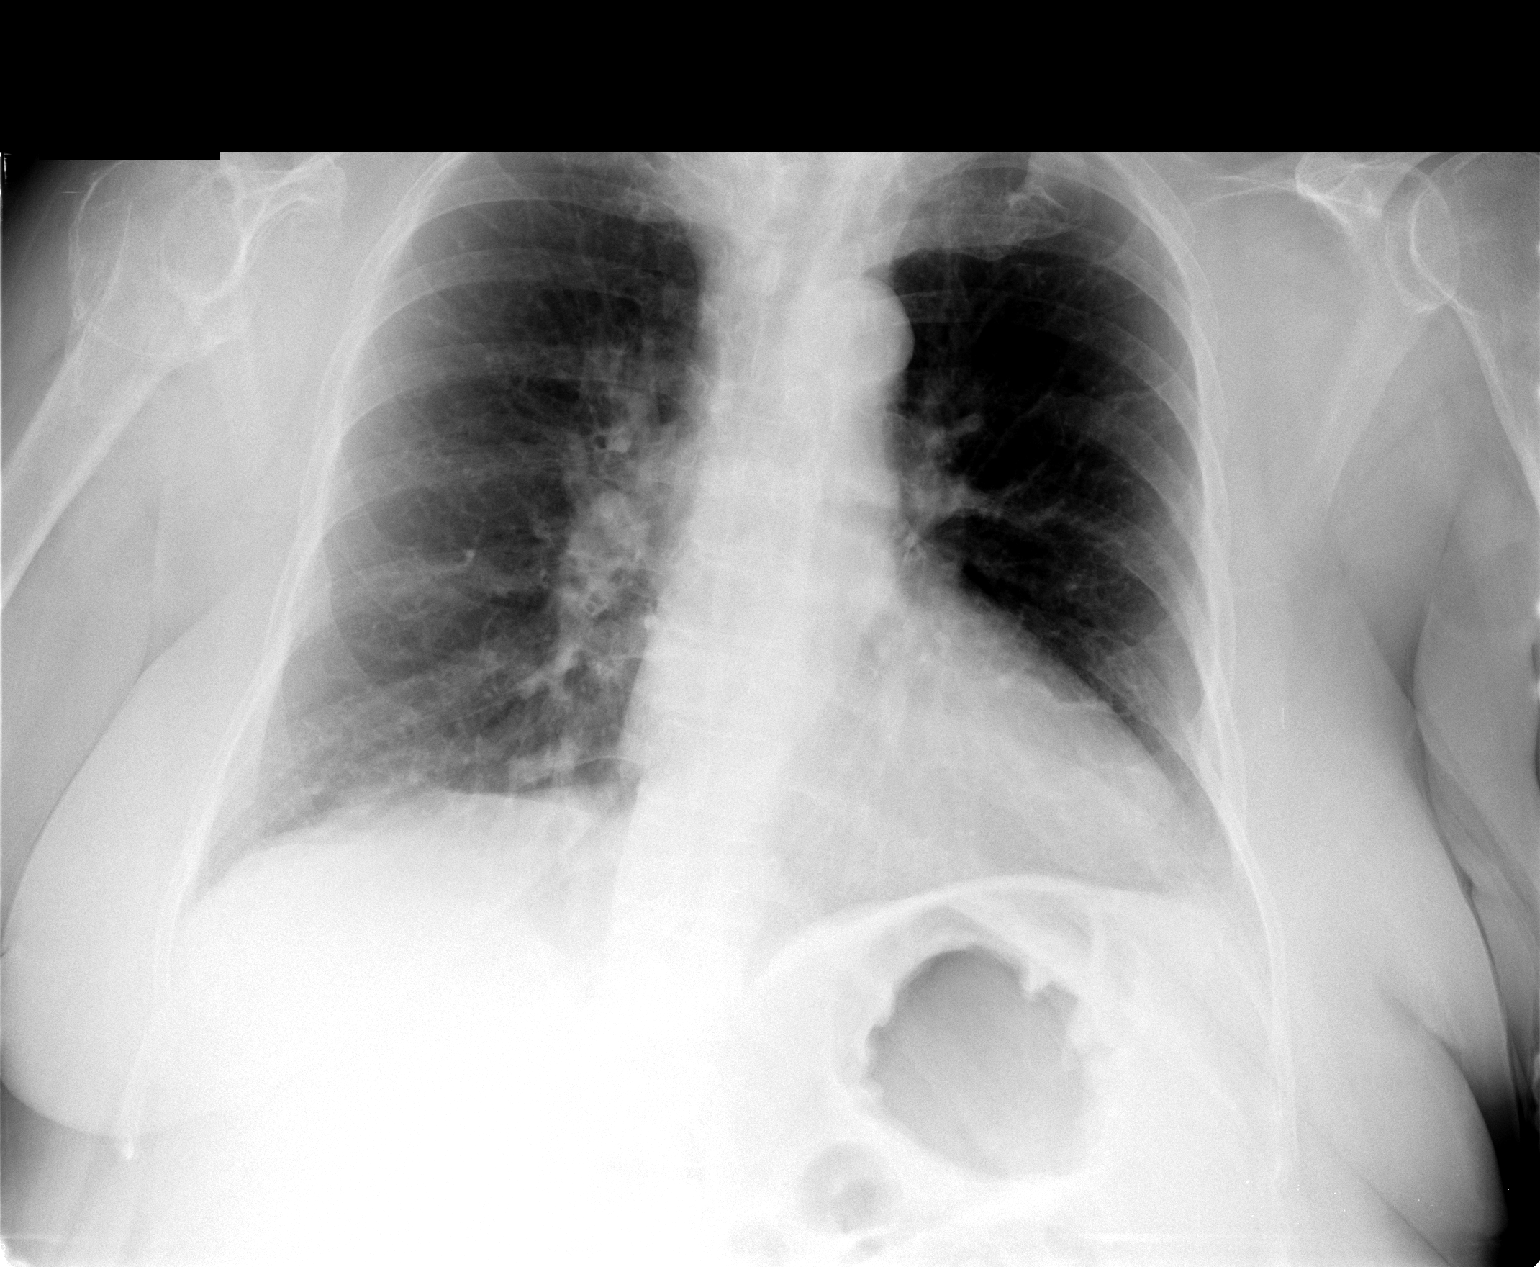

[2 of 2 positions shown; findings below may reference images not displayed]

FINDINGS: Cardiomegaly with central vascular prominence.  No
definite edema, pneumonia, effusion or pneumothorax.  Midline
trachea.  Degenerative thoracic spine.
IMPRESSION: Cardiomegaly without CHF or pneumonia.

## 2011-03-24 ENCOUNTER — Encounter: Payer: Self-pay | Admitting: Family Medicine

## 2011-03-24 ENCOUNTER — Ambulatory Visit (INDEPENDENT_AMBULATORY_CARE_PROVIDER_SITE_OTHER): Payer: Medicare Other | Admitting: Family Medicine

## 2011-03-24 VITALS — BP 127/89 | HR 95 | Temp 98.4°F | Wt 187.0 lb

## 2011-03-24 DIAGNOSIS — I10 Essential (primary) hypertension: Secondary | ICD-10-CM

## 2011-03-24 DIAGNOSIS — E785 Hyperlipidemia, unspecified: Secondary | ICD-10-CM

## 2011-03-24 DIAGNOSIS — F411 Generalized anxiety disorder: Secondary | ICD-10-CM

## 2011-03-24 DIAGNOSIS — G8929 Other chronic pain: Secondary | ICD-10-CM

## 2011-03-24 DIAGNOSIS — M7989 Other specified soft tissue disorders: Secondary | ICD-10-CM

## 2011-03-24 DIAGNOSIS — I872 Venous insufficiency (chronic) (peripheral): Secondary | ICD-10-CM

## 2011-03-24 LAB — COMPREHENSIVE METABOLIC PANEL
ALT: 27 U/L (ref 0–35)
AST: 24 U/L (ref 0–37)
Calcium: 8.4 mg/dL (ref 8.4–10.5)
Chloride: 100 mEq/L (ref 96–112)
Creat: 0.73 mg/dL (ref 0.40–1.20)
Total Bilirubin: 0.3 mg/dL (ref 0.3–1.2)

## 2011-03-24 MED ORDER — OXYCODONE HCL 20 MG PO TB12: 20.0000 mg | ORAL_TABLET | Freq: Two times a day (BID) | ORAL | Status: DC

## 2011-03-24 MED ORDER — OXYCODONE-ACETAMINOPHEN 5-325 MG PO TABS: 1.0000 | ORAL_TABLET | Freq: Four times a day (QID) | ORAL | Status: DC | PRN

## 2011-03-24 MED ORDER — ALPRAZOLAM 1 MG PO TABS: 1.0000 mg | ORAL_TABLET | Freq: Four times a day (QID) | ORAL | Status: DC

## 2011-03-24 MED ORDER — TRAMADOL HCL 50 MG PO TABS: 50.0000 mg | ORAL_TABLET | Freq: Four times a day (QID) | ORAL | Status: DC | PRN

## 2011-03-24 NOTE — Patient Instructions (Signed)
For pain- try the tramadol-  However, if you notice symptoms like those below, STOP the medicine and go to the ED Come back in 3 months for refills  Serotonin Syndrome  Taking some kinds of drugs increases the serotonin in your body. Serotonin is a brain chemical that regulates the nervous system. Drugs that increase the serotonin in your body include:  Anti-depressant medications.  St. John's wort.  Recreational drugs.  Migraine medicines.  Some pain medicines.   Combining these drugs increases the risk you will become ill with a toxic condition called serotonin syndrome.  Symptoms of too much serotonin include:  Confusion.  Agitation.  Weakness.  Insomnia.  Fever.  Sweats.   Other symptoms that may develop include:  Shakiness.  Muscle spasms.  Seizures.  Hospital treatment is often needed until the effects are controlled. Avoiding the combination of medicines listed above is recommended. Check with your doctor if you are concerned about your medicine or the side effects.

## 2011-03-26 LAB — GLUCOSE, CAPILLARY
Glucose-Capillary: 101 mg/dL — ABNORMAL HIGH (ref 70–99)
Glucose-Capillary: 101 mg/dL — ABNORMAL HIGH (ref 70–99)
Glucose-Capillary: 102 mg/dL — ABNORMAL HIGH (ref 70–99)
Glucose-Capillary: 103 mg/dL — ABNORMAL HIGH (ref 70–99)
Glucose-Capillary: 104 mg/dL — ABNORMAL HIGH (ref 70–99)
Glucose-Capillary: 105 mg/dL — ABNORMAL HIGH (ref 70–99)
Glucose-Capillary: 106 mg/dL — ABNORMAL HIGH (ref 70–99)
Glucose-Capillary: 109 mg/dL — ABNORMAL HIGH (ref 70–99)
Glucose-Capillary: 109 mg/dL — ABNORMAL HIGH (ref 70–99)
Glucose-Capillary: 114 mg/dL — ABNORMAL HIGH (ref 70–99)
Glucose-Capillary: 114 mg/dL — ABNORMAL HIGH (ref 70–99)
Glucose-Capillary: 116 mg/dL — ABNORMAL HIGH (ref 70–99)
Glucose-Capillary: 116 mg/dL — ABNORMAL HIGH (ref 70–99)
Glucose-Capillary: 120 mg/dL — ABNORMAL HIGH (ref 70–99)
Glucose-Capillary: 120 mg/dL — ABNORMAL HIGH (ref 70–99)
Glucose-Capillary: 121 mg/dL — ABNORMAL HIGH (ref 70–99)
Glucose-Capillary: 124 mg/dL — ABNORMAL HIGH (ref 70–99)
Glucose-Capillary: 124 mg/dL — ABNORMAL HIGH (ref 70–99)
Glucose-Capillary: 125 mg/dL — ABNORMAL HIGH (ref 70–99)
Glucose-Capillary: 125 mg/dL — ABNORMAL HIGH (ref 70–99)
Glucose-Capillary: 128 mg/dL — ABNORMAL HIGH (ref 70–99)
Glucose-Capillary: 129 mg/dL — ABNORMAL HIGH (ref 70–99)
Glucose-Capillary: 131 mg/dL — ABNORMAL HIGH (ref 70–99)
Glucose-Capillary: 131 mg/dL — ABNORMAL HIGH (ref 70–99)
Glucose-Capillary: 132 mg/dL — ABNORMAL HIGH (ref 70–99)
Glucose-Capillary: 134 mg/dL — ABNORMAL HIGH (ref 70–99)
Glucose-Capillary: 138 mg/dL — ABNORMAL HIGH (ref 70–99)
Glucose-Capillary: 140 mg/dL — ABNORMAL HIGH (ref 70–99)
Glucose-Capillary: 142 mg/dL — ABNORMAL HIGH (ref 70–99)
Glucose-Capillary: 143 mg/dL — ABNORMAL HIGH (ref 70–99)
Glucose-Capillary: 144 mg/dL — ABNORMAL HIGH (ref 70–99)
Glucose-Capillary: 145 mg/dL — ABNORMAL HIGH (ref 70–99)
Glucose-Capillary: 147 mg/dL — ABNORMAL HIGH (ref 70–99)
Glucose-Capillary: 149 mg/dL — ABNORMAL HIGH (ref 70–99)
Glucose-Capillary: 149 mg/dL — ABNORMAL HIGH (ref 70–99)
Glucose-Capillary: 150 mg/dL — ABNORMAL HIGH (ref 70–99)
Glucose-Capillary: 152 mg/dL — ABNORMAL HIGH (ref 70–99)
Glucose-Capillary: 155 mg/dL — ABNORMAL HIGH (ref 70–99)
Glucose-Capillary: 156 mg/dL — ABNORMAL HIGH (ref 70–99)
Glucose-Capillary: 156 mg/dL — ABNORMAL HIGH (ref 70–99)
Glucose-Capillary: 159 mg/dL — ABNORMAL HIGH (ref 70–99)
Glucose-Capillary: 160 mg/dL — ABNORMAL HIGH (ref 70–99)
Glucose-Capillary: 166 mg/dL — ABNORMAL HIGH (ref 70–99)
Glucose-Capillary: 173 mg/dL — ABNORMAL HIGH (ref 70–99)
Glucose-Capillary: 177 mg/dL — ABNORMAL HIGH (ref 70–99)
Glucose-Capillary: 179 mg/dL — ABNORMAL HIGH (ref 70–99)
Glucose-Capillary: 185 mg/dL — ABNORMAL HIGH (ref 70–99)
Glucose-Capillary: 186 mg/dL — ABNORMAL HIGH (ref 70–99)
Glucose-Capillary: 186 mg/dL — ABNORMAL HIGH (ref 70–99)
Glucose-Capillary: 195 mg/dL — ABNORMAL HIGH (ref 70–99)
Glucose-Capillary: 195 mg/dL — ABNORMAL HIGH (ref 70–99)
Glucose-Capillary: 196 mg/dL — ABNORMAL HIGH (ref 70–99)
Glucose-Capillary: 41 mg/dL — ABNORMAL LOW (ref 70–99)
Glucose-Capillary: 45 mg/dL — ABNORMAL LOW (ref 70–99)
Glucose-Capillary: 61 mg/dL — ABNORMAL LOW (ref 70–99)
Glucose-Capillary: 71 mg/dL (ref 70–99)
Glucose-Capillary: 80 mg/dL (ref 70–99)
Glucose-Capillary: 87 mg/dL (ref 70–99)
Glucose-Capillary: 88 mg/dL (ref 70–99)
Glucose-Capillary: 90 mg/dL (ref 70–99)
Glucose-Capillary: 90 mg/dL (ref 70–99)
Glucose-Capillary: 91 mg/dL (ref 70–99)
Glucose-Capillary: 93 mg/dL (ref 70–99)
Glucose-Capillary: 97 mg/dL (ref 70–99)
Glucose-Capillary: 97 mg/dL (ref 70–99)
Glucose-Capillary: 98 mg/dL (ref 70–99)
Glucose-Capillary: 98 mg/dL (ref 70–99)
Glucose-Capillary: 99 mg/dL (ref 70–99)

## 2011-03-26 LAB — CBC
HCT: 23 % — ABNORMAL LOW (ref 36.0–46.0)
HCT: 26.5 % — ABNORMAL LOW (ref 36.0–46.0)
HCT: 27.1 % — ABNORMAL LOW (ref 36.0–46.0)
HCT: 27.6 % — ABNORMAL LOW (ref 36.0–46.0)
HCT: 28.7 % — ABNORMAL LOW (ref 36.0–46.0)
HCT: 28.8 % — ABNORMAL LOW (ref 36.0–46.0)
HCT: 30.3 % — ABNORMAL LOW (ref 36.0–46.0)
HCT: 30.6 % — ABNORMAL LOW (ref 36.0–46.0)
HCT: 30.8 % — ABNORMAL LOW (ref 36.0–46.0)
HCT: 31.3 % — ABNORMAL LOW (ref 36.0–46.0)
HCT: 31.7 % — ABNORMAL LOW (ref 36.0–46.0)
HCT: 33 % — ABNORMAL LOW (ref 36.0–46.0)
HCT: 33.1 % — ABNORMAL LOW (ref 36.0–46.0)
Hemoglobin: 10 g/dL — ABNORMAL LOW (ref 12.0–15.0)
Hemoglobin: 10.4 g/dL — ABNORMAL LOW (ref 12.0–15.0)
Hemoglobin: 10.5 g/dL — ABNORMAL LOW (ref 12.0–15.0)
Hemoglobin: 10.9 g/dL — ABNORMAL LOW (ref 12.0–15.0)
Hemoglobin: 7.5 g/dL — CL (ref 12.0–15.0)
Hemoglobin: 9.1 g/dL — ABNORMAL LOW (ref 12.0–15.0)
Hemoglobin: 9.3 g/dL — ABNORMAL LOW (ref 12.0–15.0)
Hemoglobin: 9.5 g/dL — ABNORMAL LOW (ref 12.0–15.0)
Hemoglobin: 9.6 g/dL — ABNORMAL LOW (ref 12.0–15.0)
Hemoglobin: 9.7 g/dL — ABNORMAL LOW (ref 12.0–15.0)
Hemoglobin: 9.9 g/dL — ABNORMAL LOW (ref 12.0–15.0)
MCHC: 32.3 g/dL (ref 30.0–36.0)
MCHC: 32.5 g/dL (ref 30.0–36.0)
MCHC: 32.6 g/dL (ref 30.0–36.0)
MCHC: 32.7 g/dL (ref 30.0–36.0)
MCHC: 32.8 g/dL (ref 30.0–36.0)
MCHC: 32.8 g/dL (ref 30.0–36.0)
MCHC: 32.8 g/dL (ref 30.0–36.0)
MCHC: 32.8 g/dL (ref 30.0–36.0)
MCHC: 32.9 g/dL (ref 30.0–36.0)
MCHC: 33 g/dL (ref 30.0–36.0)
MCHC: 33 g/dL (ref 30.0–36.0)
MCHC: 33.6 g/dL (ref 30.0–36.0)
MCHC: 33.7 g/dL (ref 30.0–36.0)
MCHC: 34 g/dL (ref 30.0–36.0)
MCHC: 34.3 g/dL (ref 30.0–36.0)
MCHC: 34.3 g/dL (ref 30.0–36.0)
MCHC: 34.6 g/dL (ref 30.0–36.0)
MCV: 91.6 fL (ref 78.0–100.0)
MCV: 92.2 fL (ref 78.0–100.0)
MCV: 92.6 fL (ref 78.0–100.0)
MCV: 92.8 fL (ref 78.0–100.0)
MCV: 93.6 fL (ref 78.0–100.0)
MCV: 94.8 fL (ref 78.0–100.0)
MCV: 96.4 fL (ref 78.0–100.0)
MCV: 96.5 fL (ref 78.0–100.0)
MCV: 96.6 fL (ref 78.0–100.0)
MCV: 96.8 fL (ref 78.0–100.0)
MCV: 96.9 fL (ref 78.0–100.0)
MCV: 97 fL (ref 78.0–100.0)
MCV: 97.1 fL (ref 78.0–100.0)
MCV: 97.5 fL (ref 78.0–100.0)
MCV: 97.8 fL (ref 78.0–100.0)
Platelets: 145 10*3/uL — ABNORMAL LOW (ref 150–400)
Platelets: 146 10*3/uL — ABNORMAL LOW (ref 150–400)
Platelets: 155 10*3/uL (ref 150–400)
Platelets: 157 10*3/uL (ref 150–400)
Platelets: 157 10*3/uL (ref 150–400)
Platelets: 158 10*3/uL (ref 150–400)
Platelets: 161 10*3/uL (ref 150–400)
Platelets: 180 10*3/uL (ref 150–400)
Platelets: 193 10*3/uL (ref 150–400)
Platelets: 205 10*3/uL (ref 150–400)
Platelets: 227 10*3/uL (ref 150–400)
Platelets: 229 10*3/uL (ref 150–400)
Platelets: 260 10*3/uL (ref 150–400)
Platelets: 279 10*3/uL (ref 150–400)
Platelets: 279 K/uL (ref 150–400)
Platelets: 291 10*3/uL (ref 150–400)
RBC: 2.37 MIL/uL — ABNORMAL LOW (ref 3.87–5.11)
RBC: 2.92 MIL/uL — ABNORMAL LOW (ref 3.87–5.11)
RBC: 2.93 MIL/uL — ABNORMAL LOW (ref 3.87–5.11)
RBC: 2.94 MIL/uL — ABNORMAL LOW (ref 3.87–5.11)
RBC: 3.06 MIL/uL — ABNORMAL LOW (ref 3.87–5.11)
RBC: 3.15 MIL/uL — ABNORMAL LOW (ref 3.87–5.11)
RBC: 3.27 MIL/uL — ABNORMAL LOW (ref 3.87–5.11)
RBC: 3.29 MIL/uL — ABNORMAL LOW (ref 3.87–5.11)
RBC: 3.32 MIL/uL — ABNORMAL LOW (ref 3.87–5.11)
RBC: 3.43 MIL/uL — ABNORMAL LOW (ref 3.87–5.11)
RBC: 3.49 MIL/uL — ABNORMAL LOW (ref 3.87–5.11)
RBC: 3.56 MIL/uL — ABNORMAL LOW (ref 3.87–5.11)
RBC: 3.61 MIL/uL — ABNORMAL LOW (ref 3.87–5.11)
RDW: 13.8 % (ref 11.5–15.5)
RDW: 14.8 % (ref 11.5–15.5)
RDW: 14.9 % (ref 11.5–15.5)
RDW: 15 % (ref 11.5–15.5)
RDW: 15.1 % (ref 11.5–15.5)
RDW: 15.3 % (ref 11.5–15.5)
RDW: 15.3 % (ref 11.5–15.5)
RDW: 15.5 % (ref 11.5–15.5)
RDW: 15.5 % (ref 11.5–15.5)
RDW: 15.6 % — ABNORMAL HIGH (ref 11.5–15.5)
RDW: 15.8 % — ABNORMAL HIGH (ref 11.5–15.5)
WBC: 11 10*3/uL — ABNORMAL HIGH (ref 4.0–10.5)
WBC: 11 10*3/uL — ABNORMAL HIGH (ref 4.0–10.5)
WBC: 11.7 10*3/uL — ABNORMAL HIGH (ref 4.0–10.5)
WBC: 5.1 10*3/uL (ref 4.0–10.5)
WBC: 5.9 10*3/uL (ref 4.0–10.5)
WBC: 5.9 10*3/uL (ref 4.0–10.5)
WBC: 6.1 10*3/uL (ref 4.0–10.5)
WBC: 6.9 10*3/uL (ref 4.0–10.5)
WBC: 7.3 10*3/uL (ref 4.0–10.5)
WBC: 7.6 10*3/uL (ref 4.0–10.5)
WBC: 7.8 10*3/uL (ref 4.0–10.5)
WBC: 8.2 10*3/uL (ref 4.0–10.5)
WBC: 8.4 10*3/uL (ref 4.0–10.5)
WBC: 9.9 10*3/uL (ref 4.0–10.5)

## 2011-03-26 LAB — PROTIME-INR
INR: 1 (ref 0.00–1.49)
INR: 1.05 (ref 0.00–1.49)
INR: 1.15 (ref 0.00–1.49)
INR: 1.2 (ref 0.00–1.49)
INR: 1.2 (ref 0.00–1.49)
INR: 1.3 (ref 0.00–1.49)
INR: 1.3 (ref 0.00–1.49)
INR: 1.4 (ref 0.00–1.49)
INR: 1.4 (ref 0.00–1.49)
INR: 2.35 — ABNORMAL HIGH (ref 0.00–1.49)
Prothrombin Time: 13.3 seconds (ref 11.6–15.2)
Prothrombin Time: 13.6 seconds (ref 11.6–15.2)
Prothrombin Time: 14.6 seconds (ref 11.6–15.2)
Prothrombin Time: 14.6 seconds (ref 11.6–15.2)
Prothrombin Time: 16.1 seconds — ABNORMAL HIGH (ref 11.6–15.2)
Prothrombin Time: 16.4 seconds — ABNORMAL HIGH (ref 11.6–15.2)
Prothrombin Time: 17.2 seconds — ABNORMAL HIGH (ref 11.6–15.2)
Prothrombin Time: 20.6 seconds — ABNORMAL HIGH (ref 11.6–15.2)
Prothrombin Time: 25.5 seconds — ABNORMAL HIGH (ref 11.6–15.2)

## 2011-03-26 LAB — URINALYSIS, ROUTINE W REFLEX MICROSCOPIC
Bilirubin Urine: NEGATIVE
Bilirubin Urine: NEGATIVE
Glucose, UA: NEGATIVE mg/dL
Ketones, ur: 15 mg/dL — AB
Ketones, ur: NEGATIVE mg/dL
Nitrite: NEGATIVE
Protein, ur: 100 mg/dL — AB
Protein, ur: NEGATIVE mg/dL
Specific Gravity, Urine: 1.011 (ref 1.005–1.030)
Urobilinogen, UA: 1 mg/dL (ref 0.0–1.0)
pH: 8.5 — ABNORMAL HIGH (ref 5.0–8.0)
pH: 6.5 (ref 5.0–8.0)

## 2011-03-26 LAB — POCT I-STAT, CHEM 8
Calcium, Ion: 1.17 mmol/L (ref 1.12–1.32)
Calcium, Ion: 1.24 mmol/L (ref 1.12–1.32)
Glucose, Bld: 112 mg/dL — ABNORMAL HIGH (ref 70–99)
HCT: 35 % — ABNORMAL LOW (ref 36.0–46.0)
HCT: 38 % (ref 36.0–46.0)
Hemoglobin: 11.9 g/dL — ABNORMAL LOW (ref 12.0–15.0)
TCO2: 26 mmol/L (ref 0–100)
TCO2: 29 mmol/L (ref 0–100)

## 2011-03-26 LAB — DIFFERENTIAL
Basophils Absolute: 0 10*3/uL (ref 0.0–0.1)
Basophils Absolute: 0 10*3/uL (ref 0.0–0.1)
Basophils Absolute: 0 10*3/uL (ref 0.0–0.1)
Basophils Relative: 0 % (ref 0–1)
Eosinophils Absolute: 0.3 10*3/uL (ref 0.0–0.7)
Eosinophils Relative: 1 % (ref 0–5)
Eosinophils Relative: 4 % (ref 0–5)
Lymphocytes Relative: 12 % (ref 12–46)
Lymphocytes Relative: 14 % (ref 12–46)
Lymphocytes Relative: 21 % (ref 12–46)
Lymphocytes Relative: 37 % (ref 12–46)
Lymphs Abs: 1.3 10*3/uL (ref 0.7–4.0)
Lymphs Abs: 1.4 10*3/uL (ref 0.7–4.0)
Lymphs Abs: 1.9 10*3/uL (ref 0.7–4.0)
Monocytes Absolute: 0.4 10*3/uL (ref 0.1–1.0)
Monocytes Absolute: 0.9 10*3/uL (ref 0.1–1.0)
Monocytes Relative: 6 % (ref 3–12)
Monocytes Relative: 9 % (ref 3–12)
Neutro Abs: 2.5 10*3/uL (ref 1.7–7.7)
Neutro Abs: 7.1 10*3/uL (ref 1.7–7.7)
Neutrophils Relative %: 68 % (ref 43–77)
Neutrophils Relative %: 74 % (ref 43–77)
Neutrophils Relative %: 81 % — ABNORMAL HIGH (ref 43–77)

## 2011-03-26 LAB — BASIC METABOLIC PANEL
BUN: 11 mg/dL (ref 6–23)
BUN: 17 mg/dL (ref 6–23)
BUN: 2 mg/dL — ABNORMAL LOW (ref 6–23)
BUN: 2 mg/dL — ABNORMAL LOW (ref 6–23)
BUN: 25 mg/dL — ABNORMAL HIGH (ref 6–23)
BUN: 5 mg/dL — ABNORMAL LOW (ref 6–23)
BUN: 5 mg/dL — ABNORMAL LOW (ref 6–23)
BUN: 6 mg/dL (ref 6–23)
BUN: 6 mg/dL (ref 6–23)
BUN: <1 mg/dL — ABNORMAL LOW (ref 6–23)
CO2: 22 mEq/L (ref 19–32)
CO2: 24 mEq/L (ref 19–32)
CO2: 24 mEq/L (ref 19–32)
CO2: 24 mEq/L (ref 19–32)
CO2: 27 mEq/L (ref 19–32)
CO2: 27 mEq/L (ref 19–32)
CO2: 27 mEq/L (ref 19–32)
CO2: 29 mEq/L (ref 19–32)
Calcium: 8 mg/dL — ABNORMAL LOW (ref 8.4–10.5)
Calcium: 8.2 mg/dL — ABNORMAL LOW (ref 8.4–10.5)
Calcium: 8.5 mg/dL (ref 8.4–10.5)
Calcium: 8.7 mg/dL (ref 8.4–10.5)
Calcium: 8.7 mg/dL (ref 8.4–10.5)
Calcium: 8.8 mg/dL (ref 8.4–10.5)
Chloride: 105 mEq/L (ref 96–112)
Chloride: 107 mEq/L (ref 96–112)
Chloride: 109 mEq/L (ref 96–112)
Chloride: 110 mEq/L (ref 96–112)
Chloride: 110 mEq/L (ref 96–112)
Chloride: 111 mEq/L (ref 96–112)
Chloride: 113 mEq/L — ABNORMAL HIGH (ref 96–112)
Chloride: 114 mEq/L — ABNORMAL HIGH (ref 96–112)
Chloride: 119 mEq/L — ABNORMAL HIGH (ref 96–112)
Creatinine, Ser: 0.32 mg/dL — ABNORMAL LOW (ref 0.4–1.2)
Creatinine, Ser: 0.33 mg/dL — ABNORMAL LOW (ref 0.4–1.2)
Creatinine, Ser: 0.36 mg/dL — ABNORMAL LOW (ref 0.4–1.2)
Creatinine, Ser: 0.38 mg/dL — ABNORMAL LOW (ref 0.4–1.2)
Creatinine, Ser: 0.41 mg/dL (ref 0.4–1.2)
Creatinine, Ser: 0.44 mg/dL (ref 0.4–1.2)
Creatinine, Ser: 0.66 mg/dL (ref 0.4–1.2)
GFR calc Af Amer: >60 mL/min (ref >60)
GFR calc Af Amer: >60 mL/min (ref >60)
GFR calc Af Amer: >60 mL/min (ref >60)
GFR calc Af Amer: >60 mL/min (ref >60)
GFR calc Af Amer: >60 mL/min (ref >60)
GFR calc Af Amer: >60 mL/min (ref >60)
GFR calc Af Amer: >60 mL/min (ref >60)
GFR calc non Af Amer: >60 mL/min (ref >60)
GFR calc non Af Amer: >60 mL/min (ref >60)
GFR calc non Af Amer: >60 mL/min (ref >60)
GFR calc non Af Amer: >60 mL/min (ref >60)
GFR calc non Af Amer: >60 mL/min (ref >60)
GFR calc non Af Amer: >60 mL/min (ref >60)
GFR calc non Af Amer: >60 mL/min (ref >60)
GFR calc non Af Amer: >60 mL/min (ref >60)
Glucose, Bld: 110 mg/dL — ABNORMAL HIGH (ref 70–99)
Glucose, Bld: 115 mg/dL — ABNORMAL HIGH (ref 70–99)
Glucose, Bld: 120 mg/dL — ABNORMAL HIGH (ref 70–99)
Glucose, Bld: 121 mg/dL — ABNORMAL HIGH (ref 70–99)
Glucose, Bld: 121 mg/dL — ABNORMAL HIGH (ref 70–99)
Glucose, Bld: 121 mg/dL — ABNORMAL HIGH (ref 70–99)
Glucose, Bld: 166 mg/dL — ABNORMAL HIGH (ref 70–99)
Glucose, Bld: 176 mg/dL — ABNORMAL HIGH (ref 70–99)
Glucose, Bld: 86 mg/dL (ref 70–99)
Glucose, Bld: 88 mg/dL (ref 70–99)
Glucose, Bld: 94 mg/dL (ref 70–99)
Potassium: 2.7 mEq/L — CL (ref 3.5–5.1)
Potassium: 3.4 mEq/L — ABNORMAL LOW (ref 3.5–5.1)
Potassium: 3.6 mEq/L (ref 3.5–5.1)
Potassium: 3.8 mEq/L (ref 3.5–5.1)
Potassium: 4 mEq/L (ref 3.5–5.1)
Potassium: 4 mEq/L (ref 3.5–5.1)
Potassium: 4.1 mEq/L (ref 3.5–5.1)
Potassium: 4.1 mEq/L (ref 3.5–5.1)
Potassium: 4.2 mEq/L (ref 3.5–5.1)
Potassium: 4.3 mEq/L (ref 3.5–5.1)
Sodium: 140 mEq/L (ref 135–145)
Sodium: 140 mEq/L (ref 135–145)
Sodium: 141 mEq/L (ref 135–145)
Sodium: 143 mEq/L (ref 135–145)
Sodium: 143 mEq/L (ref 135–145)
Sodium: 145 mEq/L (ref 135–145)

## 2011-03-26 LAB — COMPREHENSIVE METABOLIC PANEL
ALT: 12 U/L (ref 0–35)
Albumin: 2.4 g/dL — ABNORMAL LOW (ref 3.5–5.2)
Albumin: 2.7 g/dL — ABNORMAL LOW (ref 3.5–5.2)
Alkaline Phosphatase: 49 U/L (ref 39–117)
BUN: 16 mg/dL (ref 6–23)
BUN: 5 mg/dL — ABNORMAL LOW (ref 6–23)
CO2: 21 mEq/L (ref 19–32)
Calcium: 8.8 mg/dL (ref 8.4–10.5)
Calcium: 8.2 mg/dL — ABNORMAL LOW (ref 8.4–10.5)
Chloride: 104 mEq/L (ref 96–112)
Chloride: 110 mEq/L (ref 96–112)
Creatinine, Ser: 1.21 mg/dL — ABNORMAL HIGH (ref 0.4–1.2)
Creatinine, Ser: 0.35 mg/dL — ABNORMAL LOW (ref 0.4–1.2)
GFR calc Af Amer: 54 mL/min — ABNORMAL LOW (ref >60)
GFR calc non Af Amer: 44 mL/min — ABNORMAL LOW (ref >60)
Glucose, Bld: 176 mg/dL — ABNORMAL HIGH (ref 70–99)
Glucose, Bld: 210 mg/dL — ABNORMAL HIGH (ref 70–99)
Potassium: 4.4 mEq/L (ref 3.5–5.1)
Sodium: 138 mEq/L (ref 135–145)
Sodium: 139 mEq/L (ref 135–145)
Total Bilirubin: 0.1 mg/dL — ABNORMAL LOW (ref 0.3–1.2)
Total Bilirubin: 0.4 mg/dL (ref 0.3–1.2)
Total Protein: 4.7 g/dL — ABNORMAL LOW (ref 6.0–8.3)
Total Protein: 6.2 g/dL (ref 6.0–8.3)

## 2011-03-26 LAB — CROSSMATCH
ABO/RH(D): O POS
Antibody Screen: NEGATIVE

## 2011-03-26 LAB — CLOSTRIDIUM DIFFICILE EIA: C difficile Toxins A+B, EIA: NEGATIVE

## 2011-03-26 LAB — PREPARE FRESH FROZEN PLASMA

## 2011-03-26 LAB — COMPREHENSIVE METABOLIC PANEL WITH GFR
AST: 16 U/L (ref 0–37)
CO2: 20 meq/L (ref 19–32)
Calcium: 8 mg/dL — ABNORMAL LOW (ref 8.4–10.5)
Creatinine, Ser: 0.8 mg/dL (ref 0.4–1.2)
GFR calc Af Amer: >60 mL/min (ref >60)
GFR calc non Af Amer: >60 mL/min (ref >60)

## 2011-03-26 LAB — PREPARE RBC (CROSSMATCH)

## 2011-03-26 LAB — HEPARIN LEVEL (UNFRACTIONATED)
Heparin Unfractionated: 0.21 IU/mL — ABNORMAL LOW (ref 0.30–0.70)
Heparin Unfractionated: 0.28 IU/mL — ABNORMAL LOW (ref 0.30–0.70)
Heparin Unfractionated: 0.34 IU/mL (ref 0.30–0.70)
Heparin Unfractionated: 0.51 IU/mL (ref 0.30–0.70)
Heparin Unfractionated: 0.65 IU/mL (ref 0.30–0.70)
Heparin Unfractionated: 0.79 IU/mL — ABNORMAL HIGH (ref 0.30–0.70)
Heparin Unfractionated: <0.1 IU/mL — ABNORMAL LOW (ref 0.30–0.70)

## 2011-03-26 LAB — URINE CULTURE
Colony Count: 2000
Colony Count: NO GROWTH
Culture: NO GROWTH

## 2011-03-26 LAB — CARDIAC PANEL(CRET KIN+CKTOT+MB+TROPI)
CK, MB: 1.4 ng/mL (ref 0.3–4.0)
CK, MB: 1.6 ng/mL (ref 0.3–4.0)
Relative Index: 0.9 (ref 0.0–2.5)
Relative Index: 1.1 (ref 0.0–2.5)
Total CK: 131 U/L (ref 7–177)
Total CK: 174 U/L (ref 7–177)
Troponin I: 0.01 ng/mL (ref 0.00–0.06)
Troponin I: 0.01 ng/mL (ref 0.00–0.06)
Troponin I: 0.01 ng/mL (ref 0.00–0.06)

## 2011-03-26 LAB — CK TOTAL AND CKMB (NOT AT ARMC)
CK, MB: 4.7 ng/mL — ABNORMAL HIGH (ref 0.3–4.0)
Relative Index: INVALID (ref 0.0–2.5)
Relative Index: INVALID (ref 0.0–2.5)
Total CK: 43 U/L (ref 7–177)
Total CK: 54 U/L (ref 7–177)

## 2011-03-26 LAB — APTT
aPTT: 58 seconds — ABNORMAL HIGH (ref 24–37)
aPTT: 62 seconds — ABNORMAL HIGH (ref 24–37)
aPTT: 64 seconds — ABNORMAL HIGH (ref 24–37)
aPTT: 65 seconds — ABNORMAL HIGH (ref 24–37)
aPTT: 89 seconds — ABNORMAL HIGH (ref 24–37)

## 2011-03-26 LAB — HEPARIN INDUCED THROMBOCYTOPENIA PNL: Patient O.D.: 0.119

## 2011-03-26 LAB — LIPID PANEL
Cholesterol: 121 mg/dL (ref 0–200)
HDL: 38 mg/dL — ABNORMAL LOW (ref >39)
Total CHOL/HDL Ratio: 3.2 RATIO
VLDL: 35 mg/dL (ref 0–40)

## 2011-03-26 LAB — TROPONIN I
Troponin I: 0.23 ng/mL — ABNORMAL HIGH (ref 0.00–0.06)
Troponin I: 0.35 ng/mL — ABNORMAL HIGH (ref 0.00–0.06)
Troponin I: 0.45 ng/mL — ABNORMAL HIGH (ref 0.00–0.06)

## 2011-03-26 LAB — D-DIMER, QUANTITATIVE: D-Dimer, Quant: 6.02 ug/mL-FEU — ABNORMAL HIGH (ref 0.00–0.48)

## 2011-03-26 LAB — URINE MICROSCOPIC-ADD ON

## 2011-03-26 LAB — MRSA PCR SCREENING: MRSA by PCR: NEGATIVE

## 2011-03-29 NOTE — Assessment & Plan Note (Signed)
Refilled chronic meds x 3months.  Asked again for pt to follow with psych given mult psych meds and complex history.

## 2011-03-29 NOTE — Progress Notes (Signed)
  Subjective:    Patient ID: [name], female    DOB: 1941-07-21, 70 y.o.   MRN: [id number]  HPI  Presents for f/u of chronic pain--unchanged.  Pt complains of pain that is not completely helped by current regimen, however not different than usual  Edema- unchanged, pt has seen vascular, they recommend compression hose for her. They will see her again in 3 months  HL- diet unchanged.  Taking meds as written.  Denies CP, HA, SOB.  Is wheelchair bound.  Review of Systems     Objective:   Physical Exam      Vital signs reviewed General appearance - alert,  and in no distress and oriented to person, place, and time. Wheelchair bound Chest - clear to auscultation, no wheezes, rales or rhonchi, symmetric air entry, no tachypnea, retractions or cyanosis. On 2L O2 Heart - normal rate, regular rhythm, normal S1, S2, no murmurs, rubs, clicks or gallops Extremities - peripheral pulses normal,1 plus ptting edema bilateral LE    Assessment & Plan:

## 2011-03-29 NOTE — Assessment & Plan Note (Signed)
Gave 3 months of narcotics refills.  Must return for refills

## 2011-03-29 NOTE — Assessment & Plan Note (Signed)
Gave script for graduated compression stockings that were also recommended by vascular.  Vascular will see her again in 3 months to reeval patency of filter Will check echo as had not had one for a while to assess CHF

## 2011-03-29 NOTE — Assessment & Plan Note (Signed)
At goal. Continue current regimen. 

## 2011-03-30 ENCOUNTER — Ambulatory Visit (HOSPITAL_COMMUNITY): Payer: Medicare Other | Attending: Family Medicine

## 2011-03-31 LAB — URINALYSIS, ROUTINE W REFLEX MICROSCOPIC
Glucose, UA: NEGATIVE mg/dL
Glucose, UA: NEGATIVE mg/dL
Hgb urine dipstick: NEGATIVE
Ketones, ur: NEGATIVE mg/dL
Nitrite: NEGATIVE
Protein, ur: NEGATIVE mg/dL
Specific Gravity, Urine: 1.02 (ref 1.005–1.030)
Urobilinogen, UA: 1 mg/dL (ref 0.0–1.0)
pH: 6.5 (ref 5.0–8.0)
pH: 7.5 (ref 5.0–8.0)

## 2011-03-31 LAB — DIFFERENTIAL
Basophils Absolute: 0 10*3/uL (ref 0.0–0.1)
Basophils Relative: 0 % (ref 0–1)
Eosinophils Absolute: 0.2 10*3/uL (ref 0.0–0.7)
Eosinophils Absolute: 0.2 10*3/uL (ref 0.0–0.7)
Eosinophils Relative: 4 % (ref 0–5)
Eosinophils Relative: 2 % (ref 0–5)
Eosinophils Relative: 3 % (ref 0–5)
Lymphocytes Relative: 38 % (ref 12–46)
Lymphs Abs: 1.4 10*3/uL (ref 0.7–4.0)
Lymphs Abs: 2.3 10*3/uL (ref 0.7–4.0)
Monocytes Absolute: 0.5 10*3/uL (ref 0.1–1.0)
Monocytes Absolute: 0.6 10*3/uL (ref 0.1–1.0)
Monocytes Relative: 8 % (ref 3–12)
Monocytes Relative: 8 % (ref 3–12)
Neutro Abs: 3.1 10*3/uL (ref 1.7–7.7)
Neutro Abs: 6.2 10*3/uL (ref 1.7–7.7)

## 2011-03-31 LAB — BRAIN NATRIURETIC PEPTIDE: Pro B Natriuretic peptide (BNP): 33.2 pg/mL (ref 0.0–100.0)

## 2011-03-31 LAB — CBC
HCT: 34.6 % — ABNORMAL LOW (ref 36.0–46.0)
HCT: 34.8 % — ABNORMAL LOW (ref 36.0–46.0)
Hemoglobin: 11.4 g/dL — ABNORMAL LOW (ref 12.0–15.0)
Hemoglobin: 11.1 g/dL — ABNORMAL LOW (ref 12.0–15.0)
Hemoglobin: 11.5 g/dL — ABNORMAL LOW (ref 12.0–15.0)
MCHC: 32.5 g/dL (ref 30.0–36.0)
MCV: 95.6 fL (ref 78.0–100.0)
RBC: 3.62 MIL/uL — ABNORMAL LOW (ref 3.87–5.11)
RBC: 3.73 MIL/uL — ABNORMAL LOW (ref 3.87–5.11)
RDW: 14.2 % (ref 11.5–15.5)
WBC: 6.5 10*3/uL (ref 4.0–10.5)
WBC: 6 10*3/uL (ref 4.0–10.5)

## 2011-03-31 LAB — D-DIMER, QUANTITATIVE: D-Dimer, Quant: 0.43 ug/mL-FEU (ref 0.00–0.48)

## 2011-03-31 LAB — BASIC METABOLIC PANEL
CO2: 24 mEq/L (ref 19–32)
Calcium: 8.8 mg/dL (ref 8.4–10.5)
GFR calc Af Amer: >60 mL/min (ref >60)
GFR calc non Af Amer: >60 mL/min (ref >60)
Glucose, Bld: 105 mg/dL — ABNORMAL HIGH (ref 70–99)
Glucose, Bld: 140 mg/dL — ABNORMAL HIGH (ref 70–99)
Potassium: 3.3 mEq/L — ABNORMAL LOW (ref 3.5–5.1)
Sodium: 141 mEq/L (ref 135–145)
Sodium: 141 mEq/L (ref 135–145)

## 2011-03-31 LAB — POCT I-STAT, CHEM 8
BUN: 11 mg/dL (ref 6–23)
Creatinine, Ser: 0.5 mg/dL (ref 0.4–1.2)
Sodium: 140 mEq/L (ref 135–145)
TCO2: 23 mmol/L (ref 0–100)

## 2011-04-01 ENCOUNTER — Telehealth: Payer: Self-pay | Admitting: Family Medicine

## 2011-04-01 LAB — CBC
HCT: 37.3 % (ref 36.0–46.0)
Hemoglobin: 12.2 g/dL (ref 12.0–15.0)
MCHC: 32.7 g/dL (ref 30.0–36.0)
RBC: 3.96 MIL/uL (ref 3.87–5.11)
RDW: 14.1 % (ref 11.5–15.5)

## 2011-04-01 LAB — DIFFERENTIAL
Basophils Absolute: 0 10*3/uL (ref 0.0–0.1)
Basophils Relative: 0 % (ref 0–1)
Eosinophils Relative: 2 % (ref 0–5)
Lymphocytes Relative: 12 % (ref 12–46)
Monocytes Absolute: 0.7 10*3/uL (ref 0.1–1.0)
Monocytes Relative: 9 % (ref 3–12)

## 2011-04-01 LAB — URINALYSIS, ROUTINE W REFLEX MICROSCOPIC
Nitrite: NEGATIVE
Protein, ur: NEGATIVE mg/dL
Specific Gravity, Urine: 1.022 (ref 1.005–1.030)
Urobilinogen, UA: 1 mg/dL (ref 0.0–1.0)

## 2011-04-01 LAB — BASIC METABOLIC PANEL
CO2: 22 mEq/L (ref 19–32)
Chloride: 104 mEq/L (ref 96–112)
Glucose, Bld: 124 mg/dL — ABNORMAL HIGH (ref 70–99)
Potassium: 3.5 mEq/L (ref 3.5–5.1)
Sodium: 135 mEq/L (ref 135–145)

## 2011-04-01 NOTE — Telephone Encounter (Addendum)
Ms. Everitt sister is requesting a rx for compression hoses.  She can pick it up or you can fax to [facility] on [address].  She will call back with the fax number.  The fax # is [phone]

## 2011-04-05 NOTE — Telephone Encounter (Signed)
Calling to see about this fax for her sister.  Wants to know if someone could call her when this is done.

## 2011-04-06 NOTE — Telephone Encounter (Addendum)
Sister calling back to request if any provider covering for primary could call her back regarding request for this supply.  Please call daughter [name] at [phone] at work.  Need 30/40 knee high graduated compression h osiery.  Would like to pick up Friday if possible/

## 2011-04-06 NOTE — Telephone Encounter (Signed)
Actually Dr is back in town as of yesterday, will forward to her.

## 2011-04-06 NOTE — Telephone Encounter (Signed)
MD on vacation but will be back next week

## 2011-04-09 NOTE — Telephone Encounter (Signed)
Put this rx at the desk

## 2011-04-26 ENCOUNTER — Ambulatory Visit: Payer: Medicare Other | Admitting: Surgery

## 2011-05-03 ENCOUNTER — Other Ambulatory Visit: Payer: Self-pay | Admitting: Family Medicine

## 2011-05-03 NOTE — Telephone Encounter (Signed)
Refill request

## 2011-05-07 NOTE — Consult Note (Signed)
NAMESHERITTA, [name]                ACCOUNT NO.:  [id number]   MEDICAL RECORD NO.:  [id number]          PATIENT TYPE:  INP   LOCATION:  5708                         FACILITY:  [facility]   PHYSICIAN:  Antonietta Breach, M.D.  DATE OF BIRTH:  08/11/1941   DATE OF CONSULTATION:  08/16/2006  DATE OF DISCHARGE:                                   CONSULTATION   REASON FOR CONSULTATION:  Severe mood changes.   HISTORY OF PRESENT ILLNESS:  [name] is a 70 year old female admitted to  the [facility] on the 23rd of August, due to shortness of  breath and frequent falls.   The patient had been coming to the emergency room frequently and was not  able to care for herself.  She has had ongoing labile mood, frequent periods  of sobbing, not being able to concentrate, insomnia and not being able to  perform her ADLs.  She requires assistance in her room for basic functioning  due to her poor concentration.  She has been crying almost incessantly.   PAST PSYCHIATRIC HISTORY:  The patient has been tried on a number of  psychotropics in the past including Depakote, Seroquel.  She has had trouble  with weight gain on Seroquel.  She has undergone multiple psychiatric  admissions including [facility] in 2001 and [facility] in 1998.   The patient does have a history of several episodes of expansive mood,  racing thoughts and tangential thought process along with pressured speech.  These periods have resulted in a disruption of her social functioning.   FAMILY PSYCHIATRIC HISTORY:  None.   SOCIAL HISTORY:  The patient has purchased benzodiazepines off the street in  the past.  She has gone to multiple pharmacies in the past for prescribed  controlled analgesics.  She has a history of escalating her own dosages of  these opiates.  She has required inpatient treatment for benzodiazepine and  detoxification.   The patient has a history of also excessive use of  over-the-counter  stimulants.   MARTIAL HISTORY:  Divorced 3 times.  The patient currently lives by herself.  She has 1 sibling, a sister in [place].   OCCUPATION:  Medically retired on disability.   GENERAL MEDICAL PROBLEMS:  Hypertension, diabetes type 2.   MEDICATIONS:  The MAR is reviewed.  Psychotropics include:  1. Klonopin 1 mg t.i.d.  2. Desyrel 200 mg q.h.s.  3. Effexor 225 mg daily.   ALLERGIES:  1. QUINIDINE.  2. TRIMETHOPRIM.  3. SULFAMETHOXAZOLE.  4. SULFA.   LABORATORY DATA:  The CBC is remarkable for a decreased but stable  hemoglobin at 10.5, white blood cell count is 5.3, platelet count 335,000.  The INR is 3.  Basic metabolic panel is remarkable for an elevated glucose  of 131, the SGOT was 27, SGPT 19, calcium 9.   REVIEW OF SYSTEMS:  CONSTITUTIONAL:  Afebrile.  HEAD:  No trauma.  NOSE:  No  rhinorrhea.  MOUTH/THROAT:  No sore throat.  NEUROLOGIC:  Unremarkable.  PSYCHIATRIC:  As above.  CARDIOVASCULAR:  The  patient has a history of  hyperlipidemia as well as a history of DVT and is on the warfarin pharmacy  protocol.  EYES:  The patient has a history of cataracts.  EARS:  No hearing  impairment.  RESPIRATORY:  COPD.  GASTROINTESTINAL:  History of  cholecystectomy.  GENITOURINARY:  History of hysterectomy.  SKIN:  Unremarkable.  ENDOCRINE/METABOLIC:  As above. HEMATOLOGIC/LYMPHATIC:  Anemia.  MUSCULOSKELETAL:  No deformities, weaknesses or atrophy.   EXAMINATION:  VITAL SIGNS:  Temperature is 98.3, pulse 109, respiration is  18, blood pressure 115/63.  CBGs have been running 247, 93 and 136.   MENTAL STATUS EXAM:  [name] is sobbing uncontrollably and she has very  depressed mood.  However, she is also pacing with some pressured speech.  Thought process is coherent.  Thought content:  No thoughts of harming  herself, no thoughts of harming others, no hallucinations, no delusions.  Concentration is poor.  The patient is highly distractible; however,  she is  oriented to all spheres including the floor of the hospital, which hospital,  the date and the year.  Her affect is labile.  Her judgment is impaired for  the ability to self-care.  Her insight is partial for recognizing her mood  symptoms and needing help.  Recall 3/3 immediate, 2/3 after 3 minutes.  Her  fund of knowledge and intelligence are average.   ASSESSMENT:  Axis I:  1. 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  2. Rule out bipolar disorder not otherwise specified, mixed.  3. Benzodiazepine dependence.  Axis II:  Deferred.  Axis III:  See General Medical Problems.  Axis IV:  Medical, primary support group.  Axis V:  Global Assessment of Functioning 35.   While the patient has no willed thoughts of harming herself or others, she  does have mood symptoms that compromise her ability to self-care.  She is  having very poor sleep and has not gotten any relief from her outpatient  care.   Therefore, the undersigned recommends inpatient psychiatric hospitalization  to stabilize the patient and allow her to return to the capacity for self-  care and independence as well as outpatient follow up.   RECOMMENDATION:  1. Acute psychotropic recommendations include starting Depakote as a mood      stabilizer at 250 mg b.i.d., starting Haldol for acute anticatastrophic      anxiety and acute tranquilization at 0.5 mg t.i.d. with Haldol 1 mg      b.i.d. p.r.n.  Would also reduce the Topamax to 150 mg q.h.s. to avoid      cognitive side effects.  2. Recommend admission to a psychiatric ward once medically cleared.      Options include [facility], [facility],      [facility] or [facility].  3. Regarding psychotherapy, would provide low stimulation ego supportive      therapy.      Antonietta Breach, M.D.  Electronically Signed    JW/MEDQ  D:  08/16/2006  T:  08/16/2006  Job:  [id number]

## 2011-05-07 NOTE — Discharge Summary (Signed)
[name], [name]                ACCOUNT NO.:  [id number]   MEDICAL RECORD NO.:  [id number]          PATIENT TYPE:  INP   LOCATION:  3741                         FACILITY:  [facility]   PHYSICIAN:  Benn Moulder, M.D.      DATE OF BIRTH:  [birth year]   DATE OF ADMISSION:  12/24/2005  DATE OF DISCHARGE:  12/27/2005                                 DISCHARGE SUMMARY   PRIMARY CARE PHYSICIAN:  Broadus John T. Pamalee Leyden, MD.   DISCHARGE DIAGNOSES:  1.  Chronic obstructive pulmonary disease exacerbation.  2.  Diabetes mellitus type 2.  3.  Hypertension.  4.  Depression/anxiety.  5.  History of pulmonary embolus.  6.  Tobacco abuse.  7.  Chronic anemia.   DISCHARGE MEDICATIONS:  1.  Avelox 400 mg by mouth once daily through December 30, 2005.  2.  Prednisone 40 mg by mouth once daily through January 02, 2006.  3.  Pulmicort 0.5 mg Respules inhaled two times daily.  Of note, this is a      new dose.  4.  Actos 45 mg by mouth once daily.  5.  Xopenex 1.25 mg inhaled four times per day.  6.  Altace 10 mg by mouth once daily.  7.  Amaryl 8 mg by mouth once daily.  8.  Aspirin 81 mg by mouth once daily.  9.  Coumadin dosing as directed.  Patient is to call the [facility] with INR results for daily dosing.  10. Flonase two puffs each nostril once daily.  11. Glucophage 1000 mg by mouth two times daily.  12. Klonopin 2 mg each morning, 1 mg with lunch, 1 mg with dinner by mouth.  13. Lasix 40 mg by mouth once daily.  14. Neurontin 400 mg by mouth three times per day.  15. Nexium 40 mg by mouth once daily.  16. Effexor 300 mg by mouth before bed.  17. Singulair 10 mg by mouth once daily.  18. Trazodone 300 mg by mouth q.h.s.  19. Vytorin 10/40 mg by mouth once daily.  20. Atrovent nebulizer 500 mcg inhaled four times per day.   PROCEDURES:  1.  EKG showed sinus tachycardia.  2.  Chest x-ray showed central airway thickening consistent with either the      bronchitis or reactive  airway disease.   ADMISSION LABORATORY DATA:  White blood cell count 10.0, hemoglobin 9.6,  hematocrit 30, platelets 304.  Sodium 137, potassium 3.2, chloride l02,  bicarb 29, BUN 7, creatinine 0.7, glucose 152.  Hemoglobin A1c was 8.3.  Initial ABG:  pH 7.36, pCO2 54.2, pO2 122, bicarb 29.8.   BRIEF HISTORY OF PRESENT ILLNESS:  The patient is a 70 year old female with  COPD, diabetes mellitus, and hypertension, who presented with a one-and-a-  half week history of increasing shortness of breath and dyspnea as well as  increased sputum production, who had presented to the ER on the date of  admission with worsening dyspnea despite treatment as an outpatient with  Solu-Medrol, Augmentin, and prednisone.  The patient has frequent COPD  exacerbations.   HOSPITAL COURSE:  1.  The patient was admitted and started on prednisone 40 mg once daily as      well as continuing her Xopenex 1.25 mg inhaled four times daily and      Atrovent 0.5 mg inhaled four times daily.  [name] Pulmicort dose      was clarified, and the patient will be discharged on 0.5 mg inhaled two      times a day.  In addition, the patient was started on Avelox 400 mg once      daily.  The patient received BiPAP on the night of admission but did not      require any further BiPAP throughout the rest of her hospital stay.  The      patient improved rapidly and at the time of discharge was ambulating      while maintaining her oxygen saturations on room air and was without an      oxygen requirement during the night.  The patient remained afebrile      throughout the hospital course.  The patient will finish a 10-day course      of prednisone 40 mg once daily as well as a 7-day course of Avelox 400      mg once daily.  2.  Diabetes mellitus:  The patient was started on her home meds, which      included Actos 45 mg once daily, Amaryl 8 mg once daily, Glucophage 1000      mg p.o. b.i.d.  The patient was also covered with  NovoLog sliding scale      while she was in the hospital due to the increase in blood sugars as      expected due to prednisone.  Of note, the patient's A1c was 8.3 during      the hospital admission.  The patient was discharged with her diabetes      medications unchanged.  3.  Hypertension:  [name] blood pressure was well-controlled during      her hospital stay on her home medication of Altace 10 mg by mouth once      daily.  4.  History of pulmonary embolus:  The patient was maintained on Coumadin      while in the hospital.  Pharmacy monitored the Coumadin dosing as the      patient's INR became increased likely secondary to Avelox.  The patient      received 10 mg of Warfarin, which is her home dose, on the day of      discharge, and her INR will have to be closely followed while she is on      the Avelox.  5.  Anxiety:  The patient was maintained on her home medications including      Klonopin 2 mg each morning as well as 1 mg with lunch and 1 mg with      dinner.  The patient was also continued on her Effexor.  Haldol was used      p.r.n. for periods of extreme agitation, which resulted in patient      having some respiratory distress.  6.  Pain control:  The patient with multiple complaints of pain.  The      patient was maintained on her home dose of Neurontin 400 mg t.i.d.      Tylenol and Toradol were also used p.r.n. as needed for her pain      control.   FOLLOWUP:  Follow up appointment is with Dr. Tanya Nones at [facility], phone number [phone], on December 30, 2005, at 11 a.m.   DISCHARGE INSTRUCTIONS:  Patient will have Home Health nurse and [facility] see her daily.  Instructions were given to have the patient's INR  checked daily and to call the [facility], phone number [phone],  with the results of the INR.  Dr. Tanya Nones will dose the Coumadin based on the INR results for the time being.      Benn Moulder,  M.D.     MR/MEDQ  D:  12/27/2005  T:  12/27/2005  Job:  [id number]   cc:   Broadus John T. Pamalee Leyden, MD  Fax: [phone]

## 2011-05-07 NOTE — H&P (Signed)
[name], [name]                ACCOUNT NO.:  [id number]   MEDICAL RECORD NO.:  [id number]          PATIENT TYPE:  INP   LOCATION:  5732                         FACILITY:  [facility]   PHYSICIAN:  Dwana Curd. Para March, M.D. DATE OF BIRTH:  [birth year]   DATE OF ADMISSION:  12/01/2006  DATE OF DISCHARGE:                              HISTORY & PHYSICAL   CHIEF COMPLAINT:  Shortness of breath.   HISTORY OF PRESENT ILLNESS:  The patient is a 70 year old female with a  history of multiple medical problems who went to the [facility] ED  today after 3 days of increasing cough with green sputum.  She has also  had a fever to 102 x24 hours.  She was given Rocephin and azithromycin  in the emergency room along with one nebulized treatment.  Blood  cultures were not obtained.  She had a slight decrease in symptoms, but  she needed to be transferred to [facility], per the opinion of the EDP,  for treatment, given her clinical appearance and her multiple comorbid  conditions.  She has chronic shortness of breath.  This is slightly  decreased today.  She has had no rash.  She did have a fall recently and  she was cleared in the ED without a fracture, but she did have multiple  facial bruises.  She is on Coumadin.   Note, the patient has multiple medical discrepancies from her bag of  medicines and the electronic medical record medication list.   REVIEW OF SYSTEMS:  As above.  Also, CONSTITUTIONAL:  Positive fever,  CARDIOVASCULAR:  No chest pain.  PULMONARY:  Positive shortness of  breath.  GI:  No nausea and vomiting.  GU:  No dysuria.  SKIN:  Increased and positive bruising.   PROBLEM LIST:  The patient has the following medical problems:  1. Diabetes type 2.  2. Hyperlipidemia.  3. Depression.  4. Anxiety.  5. Hypertension.  6. Chronic obstructive pulmonary disease.  7. Osteopenia.  8. Obesity.  9. Smoking.  10.Thrombophlebitis.  11.Cataract.  12.Bipolar disorder.  13.A history of  cholecystectomy.  14.A history of blood clots.  15.A history of cervical cancer.   MEDICATIONS:  Are listed as Actos, Advair, albuterol, Amaryl,  amitriptyline, aspirin, Coumadin, Effexor, enalapril, ferrous sulfate,  Glucophage, Klonopin, Lasix, Prilosec, Seroquel, Vytorin; however, there  are several medications in her bag that are not listed on this  electronic record, including Topamax, K-Dur, Atrovent, Neurontin,  Haldol, trazodone, and Phenergan.   ALLERGIES:  Include BACTRIM.   FAMILY HISTORY:  She has a father and two aunts with a history of  coronary artery disease.  She has an aunt with diabetes.  Her uncle and  mother both had a stroke.  Her mother died of heart failure.   SOCIAL HISTORY:  She is disabled due to COPD.  She chronically wears  oxygen.  She lives in an apartment in [place].  She is divorced.  She  has a 20-pack-year history of tobacco.  She does not use alcohol or  drugs.   PHYSICAL EXAMINATION:  VITAL SIGNS:  Temperature  98.5, pulse 129,  respiratory rate 22, blood pressure 133/79, SpO2 95% on 4 L.  GENERAL APPEARANCE:  She is anxious, but this is at her baseline.  MENTAL STATUS:  Alert and oriented.  HEENT:  Head:  Positive bruising under the eyes bilaterally.  Eyes:  Extraocular movements intact.  Pupils equally round and reactive.  Mouth:  Mucous membranes moist.  CHEST:  She has decreased breath sounds at the bases and polyphonic  wheeze with increased expiratory phase throughout.  She does have fair  air movement.  HEART:  Regular rate and rhythm.  ABDOMEN:  Soft, nontender, nondistended.  Positive bowel sounds.  Obese.  EXTREMITIES:  1+ edema in the bilateral lower extremities, 2+ dorsalis  pedis pulses.  RECTAL EXAM:  Deferred.  NEUROLOGIC:  Grossly intact.  Motor and sensation x4.  SKIN:  No rash.  ADENOPATHY:  None noted.   LABORATORY TESTS:  BNP 126, sodium 145, potassium 4.3, chloride 109,  bicarb 30, BUN 7, creatinine 0.5, glucose  168, white count 13.8,  hemoglobin 12.3, platelets 263, INR 1.6.  Point-of-care enzymes negative  x1.  Chest x-ray shows right lower lobe atelectasis.  CT was obtained to  look for PE.  This showed atelectasis versus pneumonia in the right  lower lobe.   ASSESSMENT AND PLAN:  The patient is a 70 year old female with the  following problems:  1. Chronic obstructive pulmonary disease exacerbation.  Patient has      been given Rocephin and azithromycin.  We will continue this along      with steroids, nebulized treatments, and incentive spirometry.      There is no reason to culture her blood now as she has gotten      previous antibiotics.  We will continue with Advair and follow her      symptomatically.  2. Diabetes.  We will follow her capillary blood glucose since she is      going to be on steroids and continue her home medications.  3. Psychiatric disease.  Continue Effexor, Seroquel, and Klonopin, and      follow up with her primary medical doctor.  4. Increased blood pressure.  Continue her angiotensin-converting      enzyme inhibitor along with her Lasix and K-Dur and follow her      creatinine and potassium.  5. Continue Coumadin per pharmacy with an a.m. INR.  6. PPI per home dose.   We need to find her home meds with her primary MD and eliminate these  discrepancies.  This is difficult given that she has multiple  medications prescribed through the family practice center and apparently  several others prescribed at a mental health facility.      Dwana Curd Para March, M.D.     GSD/MEDQ  D:  12/02/2006  T:  12/02/2006  Job:  [id number]   cc:   Broadus John T. Pamalee Leyden, MD

## 2011-05-07 NOTE — Discharge Summary (Signed)
NAMEBRIDGETTE, [name]                ACCOUNT NO.:  [id number]   MEDICAL RECORD NO.:  [id number]          PATIENT TYPE:  INP   LOCATION:  5114                         FACILITY:  [facility]   PHYSICIAN:  Zenaida Deed. Mayford Knife, M.D.DATE OF BIRTH:  [birth year]   DATE OF ADMISSION:  02/20/2007  DATE OF DISCHARGE:  03/03/2007                               DISCHARGE SUMMARY   PRIMARY CARE PHYSICIAN:  Broadus John T. Pamalee Leyden, M.D. at [facility]   DISCHARGE DIAGNOSES:  1. Stable complex C6 C-spine fracture currently being managed      conservatively with hard C collar stabilization.  2. Pneumonia, improving.  3. Escherichia coli urinary tract infection, improving.  4. Iron deficiency anemia with history of hemoccult positive stools.  5. Type 2 diabetes.  6. Hypertension.  7. Hyperlipidemia.  8. Bipolar disorder and anxiety disorder.  9. Severe chronic obstructive pulmonary disease on home oxygen.  10.History of deep venous thrombosis and pulmonary embolism, no longer      on chronic anticoagulation due to patient noncompliance.  11.Chronic constipation.   DISCHARGE MEDICATIONS:  1. Albuterol MDI one to two puffs every 4 hours as needed.  2. Advair 250/50 one puff inhaled twice daily.  3. Aspirin 81 mg p.o. daily.  4. Klonopin 1 mg p.o. three times daily.  5. Colace 100 mg p.o. twice daily.  6. Ensure one can three times daily 30 minutes prior to meals.  7. Vytorin 10/40 mg p.o. daily.  8. Neurontin 400 mg p.o. three times daily and 1200 mg p.o. at      bedtime.  9. Amaryl 8 mg p.o. daily.  10.Actos 45 mg p.o. daily.  11.Metformin 1000 mg p.o. twice daily.  12.Haldol 0.5 mg p.o. twice daily and 1 mg p.o. at bedtime.  13.Haldol 1 mg p.o. or IV every 6 hours as needed for agitation.  14.MS Contin 30 mg p.o. twice daily.  15.Avelox 400 mg p.o. daily through March 06, 2007, for a total of 7-      day course.  16.Multivitamin p.o. daily.  17.Prilosec 20 mg p.o. daily.  18.MiraLax 17 grams p.o. daily.  19.Trazodone 150 mg p.o. at bedtime (this is 1/2 of the patient's      previous dose).  20.Effexor 300 mg p.o. daily.  21.Topamax 100 mg p.o. at bedtime.  22.Ferrous sulfate 325 mg p.o. twice daily to be restarted after the      patient completes course of Avelox as above.  23.The patient is to hold enalapril 40 mg p.o. daily. This may be      restarted as needed after discharge.  24.The patient is to hold Lasix 40 mg p.o. daily.  This may be      restarted as needed after discharge.   CONSULTATIONS:  Stefani Dama, M.D. with neurosurgery.   PROCEDURE:  1. Preview of left shoulder plain films on February 20, 2007, was negative      for acute injury or fracture.  2. PA and lateral chest x-ray on February 20, 2007, showed no active lung      disease.  3. Head CT without contrast on February 20, 2007, showed no acute      intracranial findings and a high left frontal scalp hematoma.  4. CT of the cervical spine on February 20, 2007, showed acute C6      posterior vertebral body fracture through the posterior cortex      without significant compression or retropulsion and also an acute      right C6 fracture of the facet and lamina of C6 vertebra.  5. Abdominal and pelvic CT on February 20, 2007, showed no acute intra-      abdominal or intrapelvic findings and the patient is status post      cholecystectomy.  Remote L2 superior endplate fracture is present.      The patient has anterior left thigh subcutaneous bruising and      swelling.  6. C-spine plain films on February 21, 2007, showed confirmed C6 posterior      inferior vertebral fracture as well as right facet and laminar      fracture of C6 with diffuse osteopenia.  7. PA and lateral chest x-ray on February 22, 2007, showed no active      cardiopulmonary disease.  8. Modified barium swallow study on February 23, 2007, showed aspiration      with thin liquids.  9. CT angiogram of the chest on February 26, 2007, was negative  for      pulmonary embolism and showed stable mild precarinal enlarged lymph      nodes.  10.PA and lateral chest x-ray on February 27, 2007, showed left lower      lobe pneumonia.  11.The patient was transfused two units of packed red blood cells on      February 28, 2007.  12.Repeat C-spine plain films on March 01, 2007, showed persistence of      C6 vertebral, facet, and laminar fracture to be reviewed by      neurosurgeon.   LABORATORY DATA:  On admission, the patient's CBC revealed a white blood  cell count of 6.8, hemoglobin 9.5, hematocrit 29.1, and platelets 283.  [name] hemoglobin did reach a nadir of 7.9 at which point the  patient was transfused two units of packed red blood cells with an  appropriate rise in her hemoglobin to 9.4 post transfusion.  At the time  of discharge, the patient's CBC shows a white count of 7.9, hemoglobin  of 12.9, hematocrit 37.7, and platelets 250.  [name] hemoglobin  remained stable status post transfusion.  Complete metabolic panel on  admission showed sodium of 143, potassium 3.9, chloride 117, bicarb 19,  BUN 8, creatinine 0.71, glucose 124, calcium 8.3, total bilirubin 0.9,  alkaline phosphatase 52, AST 46, ALT 63, total protein 5.6, and albumin  3.0.  [name] LFT's did trend downward prior to discharge with AST  decreasing to 26 and ALT decreasing to 45.  Urinalysis upon admission  was negative except for an elevated specific gravity of 1.039 and 15  ketones.  Coags on admission were normal with a PT of 12.9 and INR of  1.0, PTT of 27.  Cardiac enzymes were cycled on admission and were  negative x3.  EKG showed no acute changes.  Anemia studies performed  during this admission revealed a normal ferritin of 153, total iron  binding capacity of 263, low iron of 14, low percent saturation of 5,  normal B12 of 638, and folate of greater than 20.0. TSH was found to be  mildly decreased during this hospitalization of 0.325, however,  T3 was  normal at 2.9 and free T4 normal at 1.01.  Blood cultures and urine  cultures were obtained on February 27, 2007, for fever.  Blood cultures  showed no growth at the time of discharge, urine culture was positive  for greater than 100,000 colonies of Escherichia coli which was  sensitive to all antibiotics except for ampicillin and cefazolin.  The  patient was fecal occult blood negative x3 during this hospitalization.   HOSPITAL COURSE:  Please see full dictated history and physical for full  details of admission presentation and initial workup.  In brief, this is  a 70 year old white female with bipolar disorder who was admitted with a  stable but acute C6 cervical spine fracture.   Problem 1.  C6 cervical spine fracture.  Stable.  Neurosurgery was  consulted on admission and recommended conservative management with a  hard C collar 24 hours daily for at least 6-8 weeks.  Repeat c-spine  films were performed prior to the patient's discharge and neurosurgery  will be needed for follow-up on these films.  Please see addendum for  timing of the patient's need to follow up with neurosurgery.  The  patient remained stable in hard C collar throughout admission and our  management of her C6 fracture was limited to pain control.   Problem 2.  Pain.  The patient does have underlying chronic pain,  however, her acute pain related to the C6 fracture was initially managed  with IV Dilaudid as needed which was then converted to long acting MS  Contin with morphine IR for breakthrough pain.  [name] MS Contin  was gradually decreased secondary to sedation from an initial dose of 60  mg p.o. b.i.d. to 30 mg p.o. b.i.d. prior to discharge.  The patient was  continued on morphine IR 15 mg p.o. every 4 hours as needed and required  minimal to no doses daily for breakthrough pain.   Problem 3.  Frequent falls.  The patient has been having increasing  number of falls at home prior to this  admission.  Falls are likely  multifactorial including dehydration and polypharmacy.  [name]  primary care physician had already initiated the process of skilled  nursing facility placement prior to this admission and this process was  facilitated during this admission.  The patient is felt unsafe at home.  Physical therapy and occupational therapy did evaluate this patient  recommend nursing home level care and follow the patient throughout  admission.   Problem 4.  Fever.  The patient developed fever during hospitalization  at which point cultures were obtained as well as a repeat chest x-ray.  Chest x-ray was consistent with a left lower lobe pneumonia and the  patient was begun on Avelox 400 mg p.o. daily on February 27, 2007.  Urine  cultures were eventually positive for Escherichia coli as well.  The  patient will be continued on a total of a 7-day course of Avelox for both her pneumonia and urinary tract infection.  The patient will need a  test of cure with repeat urine culture after successful completion of  treatment.   Problem 5.  Anemia, iron deficiency.  Iron deficiency anemia was  reconfirmed with anemia studies during this hospitalization.  I spoke  with the patient's primary M.D., Dr. Tanya Nones regarding previous workup  for iron deficiency anemia in this postmenopausal female.  The patient  had seen Asencion Partridge.  Medoff, M.D. with gastroenterology for initial  consult for outpatient colonoscopy, however, had not gone through the  procedure prior to admission.  I recontacted Dr. Kinnie Scales during this  hospitalization regarding her colonoscopy who recommended not performing  the procedure unless very urgent until after the patient is out of her  hard C collar.  The patient did receive transfusion of two units of  packed red blood cells during this admission for a nadir of hemoglobin  down to 7.9.  Her hemoglobin rose appropriately status post transfusion  and remained stable  at the time of discharge.  The patient has no  evidence of active acute bleeding as her fecal occult blood tests were  negative x3 during this hospitalization.  Given the patient's history of  heme positive stools, however, the patient's Lovenox was discontinued  and the patient's previous home aspirin dose of 325 mg p.o. daily was  decreased to 81 mg p.o. daily.  The patient will need outpatient  colonoscopy once her C-spine is cleared and her collar may be removed.   Problem 6.  Tachycardia.  The patient had persistent mild tachycardia  throughout her admission.  It was trending downward by the time of  discharge running in the low 100's; however, extensive workup failed to  reveal a cause other than agitation.  CT angiogram of the chest was  negative for evidence of pulmonary embolism.  Blood transfusion for  anemia failed to resolve tachycardia as well.  IV fluid hydration also  failed to cause significant improvement.  TSH was checked and although  mildly low T3 and free T4 were normal.   Problem 7.  Type 2 diabetes.  [name] blood glucose remained  relatively stable on her home oral hypoglycemics including Metformin  1000 mg p.o. b.i.d., Amaryl 8 mg p.o. daily, and Actos 45 mg p.o. daily.  These medications were not adjusted during this admission.  The  patient's last hemoglobin A1C in January of 2008 indicated good control  of her diabetes.   Problem 8.  COPD.  [name] COPD remained relatively stable on her  home medications of albuterol, Advair, and home oxygen.  The patient was  given p.r.n. nebulizers occasionally for increased wheezing.   Problem 9.  Hypertension.  [name] blood pressures remained stable  off of her home medications.  Her blood pressures remained too low to  consider restarting her blood pressure medicines prior to discharge  given her risk of falls.  Her blood pressures prior to discharge were ranging from the 90's to 120's systolics over  60's to 70's diastolics.  I would continue to follow in the outpatient setting and consider  restarting the patient's home dose of Enalapril 40 mg p.o. daily and  Lasix 40 mg p.o. daily if blood pressures will tolerate.   Problem 10.  Bipolar and anxiety disorders.  The patient was continued  on her multiple home psyche medications during this hospitalization  including Haldol, Klonopin, Trazodone, Effexor, and Topamax.  The  patient's medication doses were adjusted mildly given her initial  sedation and then increasing anxiety as her hospitalization drew on.  [name] Trazodone was decreased to half of her previous home dose  due to sedation on narcotic pain medications.  [name] Haldol was  increased from previous home dose for increasing agitation.   DISPOSITION:  Given the patient's multiple ED visits and medication  noncompliance in the past and the patient's increasing danger at home  without 24-hour care, nursing  home placement was undertaken during this  hospitalization.  The patient is not felt able to comply with 24-hour  hard C collar in the outpatient setting without supervision in the  nursing home.   DISCHARGE INSTRUCTIONS:  The patient is to follow a carbohydrate  modified low sodium dysphagia II diet which is the next step up from a  pureed diet.  She is also to have a honey-thickened liquids.   The patient is to continue physical therapy and occupational therapy for  increasing strength and activity.   The patient is to keep her hard C collar in place 24 hours daily until  cleared by neurosurgery.   FOLLOWUP:  The patient is to follow up with Dr. Danielle Dess with neurosurgery  in 1-2 months after discharge.  Based on her repeat C-spine films from  March 01, 2007, please see addendum for exact time of follow-up.   PENDING RESULTS AND ISSUES TO BE FOLLOWED AT DISCHARGE:  1. Neurosurgery follow-up for C-spine clearance.  2. Final read on blood cultures from  February 27, 2007.  3. Outpatient colonoscopy should be performed after the patient's hard      C collar is removed.  The patient may see Dr. Kinnie Scales for this.  4. The patient will need a repeat urine culture for test of cure for      current Escherichia coli urinary tract infection at the time of      discharge.   The patient was discharged to skilled nursing facility in stable medical  condition.     ______________________________  Drue Dun, M.D.    ______________________________  Zenaida Deed. Mayford Knife, M.D.    EE/MEDQ  D:  03/02/2007  T:  03/02/2007  Job:  [id number]

## 2011-05-07 NOTE — Discharge Summary (Signed)
[place]  Patient:    [name], [name] Visit Number: [id number] MRN: [id number]          Service Type: MED Location: [phone] Attending Physician:  Sanjuana Letters Dictated by:   Lucille Passy, M.D. Admit Date:  01/07/2002 Discharge Date: 01/10/2002   CC:         Andrey Spearman, M.D.             Daine Floras, M.D.                           Discharge Summary  DATE OF BIRTH:  Dec 04, 1941  PRIMARY CARE PHYSICIAN: Andrey Spearman, M.D. at the [facility].  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 3. Rule out myocardial infarction. 4. Tobacco abuse. 5. Chronic obstructive pulmonary disease. 6. Diabetes type 2.  CONSULTATIONS:  Psychiatry from behavioral health - Dr. Jeanie Sewer.  DISCHARGE MEDICATIONS:  1. Lipitor 10 mg two tablets p.o. q.d.  2. Albuterol and Atrovent nebulizer treatments q.i.d. as needed.  3. Altace 10 mg p.o. q.d.  4. Amaryl 4 mg p.o. q.d.  5. Enteric-coated aspirin 325 mg p.o. q.d.  6. Effexor 150 mg p.o. q.h.s.  7. K-Dur 20 mEq p.o. q.d.  8. Lasix 40 mg p.o. q.d.  9. Singulair 10 mg p.o. q.d. 10. Trazodone 100 mg p.o. q.h.s. 11. Flovent 220 mg four puffs b.i.d. 12. Nexium 20 mg p.o. b.i.d. 13. Nicotine patches 22 mg topically q.24h.  The patient was instructed not to     smoke while using these patches. 14. Prednisone taper 40 mg p.o. b.i.d. times one day, 20 mg p.o. b.i.d. times     one day, 20 mg q.d. times one day, and 10 mg q.d. times one day. 15. Ativan taper 1 mg p.o. t.i.d. times one day then 1 mg p.o. b.i.d. times     one day then 1 mg p.o. q.d. times one day.  DISCHARGE DIET:  Diabetic.  The patient was instructed not to take any Ephedrine Minithins.  She was instructed that these were very bad for her health.  FOLLOW-UP APPOINTMENT: 1. Dr. Betti Cruz at the [facility] on Tuesday, January 16, 2002, at    1:15  p.m. 2. Dr. Ardean Larsen at the [facility], the patient was instructed    to call for an appointment within one month.  HISTORY OF PRESENT ILLNESS:  The patient is a 70 year old Caucasian female with a history of diabetes type 2, hypertension, high cholesterol, anxiety, and COPD who presented to the emergency room with two or three weeks of sharp chest pain on and off, relieved by nitroglycerin.  She also stated that she had been taking up to 100 ephedrine tablets each day.  She also admitted to feeling extremely anxious, tremulous, and having hot and cold flashes.  The patient also reported nausea and vomiting up to five times a day for the two weeks prior to admission, but she had no episodes of emesis or nausea during hospitalization.  ADMISSION DIAGNOSTIC STUDIES:  A pH was 7.411, PCO2 46.3, _____ 30 on an i-STAT ABG.  Sodium 139, potassium 3.8, CO2 31, chloride 105, BUN 13, creatinine 0.7, glucose 67.  Hemoglobin 14 and hematocrit 41.  Urine drug screen positive for amphetamines and benzodiazepines.  The amphetamines are likely due to her over use of ephedrine, and the patient was given  Valium in the emergency room prior to the urine drug screen.  Admission EKG showed sinus tachycardia with a rate of 106, insignificant Q-waves in leads II, III, and aVF.  Admission chest x-ray showed COPD with no infiltrate.  HOSPITAL COURSE: #1 - EPHEDRINE ABUSE:  Of note, the patient was admitted to behavioral health on December 2001 for ephedrine abuse and was seen there by Dr. Betti Cruz.  All stimulants were discontinued during hospitalization, and the patient felt slightly less anxious and jittery throughout hospitalization.  She was seen by Dr. Jeanie Sewer from behavioral health for a consult.  Dr. Jeanie Sewer recommended intensive outpatient rehab therapy for drug abuse for this patient to start on the day of discharge at behavioral health.  However, the patient declined this as she is  caring for her terminally ill mother at home and is anxious to get back home to her mothers bedside.  He also recommended Neurontin to augment her Effexor to help her with anxiety and to decrease somatic pain, but the patient also declined this medicine.  Otherwise, he agreed with her current medicines and treatment and suggested very close follow up with the patients psychiatrist, Dr. Betti Cruz, at the [facility].  Dr. Jeanie Sewer also diagnosed with the patient with a major depression, recurrent, in partial remission, as well as the above-mentioned polysubstance dependence, and he was also unclear whether the patient has bipolar disorder or has manic episodes due to her stimulant abuse history.  The patient had no suicidal or homicidal ideation during hospitalization and did contract for safety.  The patient was instructed on discharge to not take any more ephedrine tablets.  #2 - HISTORY OF BENZODIAZEPINE ABUSE:  The patient has a known history of buying Xanax on the street and has a known benzodiazepine dependence.  She admitted to only taking small amounts of Ativan at home.  During hospitalization, she was treated with an Ativan taper which will be continued on discharge.  She had no seizure activity or other withdrawal symptoms during hospitalization.  #3 - RULE OUT MYOCARDIAL INFARCTION:  The patient had three sets of negative cardiac enzymes during hospitalization and no further episodes of chest pain. Her chest pain was atypical in nature and is likely secondary to ephedrine abuse.  She is already on an aspirin and an ACE inhibitor.  She is already on a proton pump inhibitor.  A TSH during hospitalization was within normal limits at 1.231.  A cholesterol panel during hospitalization showed a cholesterol of 219, triglycerides 121, HDL 44, LDL high at 151.  The patients home dose of Lipitor 10 mg a day was increased to 20 mg a day.  #4 - TOBACCO ABUSE:  The patient was  given nicotine patches, 22 mg q.24h.  during hospitalization.  She was also seen by the smoking cessation counselor who recommended continuing the nicotine replacement therapy patches as an outpatient.  The patient was advised to continue using these patches but was warned not to smoke while using them.  #5 - CHRONIC OBSTRUCTIVE PULMONARY DISEASE:  Stable during hospitalization. Oxygen saturations were stable in the low 90s on room air.  The patient was treated with her home regimen of p.r.n. albuterol and Atrovent nebulizer treatments as well as Flovent and Singulair during hospitalization.  Her white blood cell count did increase from 6.8 to 12.8 during hospitalization, but she was afebrile throughout hospitalization, and this was likely secondary to her treatment with steroids.  She did have diffuse expiratory wheezes and an increased expiratory  phase during hospitalization but did have adequate air movement.  She was treated with a prednisone taper and will be discharged on this taper, see above.  A chest x-ray did not show an infiltrate as stated above.  #6 - DIABETES TYPE 2:  Blood sugars during hospitalization ranged from the 80s to 200s.  She was continued on her home dose of Amaryl and covered with a sliding scale.  These elevated blood sugars may be due to her course of steroids, and these should be watched as an outpatient.  DISCHARGE DIAGNOSTIC STUDIES:  Urinalysis negative.  Hemoglobin 13.2, white blood cells 12.8, platelets 281.  Sodium 140, potassium 3.8, chloride 106, CO2 27, BUN 13, creatinine 0.6, glucose 96.  Normal sinus rhythm or sinus tachycardia on telemetry throughout hospitalization.  RECOMMENDATIONS FOR THE PATIENTS PRIMARY DOCTOR: 1. Please note that the patient now sees Dr. Betti Cruz as her outpatient    psychiatrist. 2. Please note the increase in the patients Lipitor dose. 3. Please note elevated CBGs during hospitalization which should be monitored    on  an outpatient basis. Dictated by:   Lucille Passy, M.D. Attending Physician:  Sanjuana Letters DD:  01/10/02 TD:  01/11/02 Job: [id number]

## 2011-05-07 NOTE — Discharge Summary (Signed)
NAMEJASA, DUNDON                          ACCOUNT NO.:  [id number]   MEDICAL RECORD NO.:  [id number]                   PATIENT TYPE:  PS   LOCATION:  0300                                 FACILITY:  [facility]   PHYSICIAN:  Haskel Khan, M.D.           DATE OF BIRTH:  12/20/1941   DATE OF ADMISSION:  05/05/2002  DATE OF DISCHARGE:  05/23/2002                                 DISCHARGE SUMMARY   IDENTIFYING DATA:  This is a 70 year old Caucasian female voluntarily  admitted, caring for elderly mother until she died last week.  The patient  had symptoms of anxiety.  Had taken 125 tablets of Ephedra over a couple of  days to have the strength to keep going.  As per patient, patient has a long  history of polysubstance abuse.  She has been unable to sleep or eat  properly due to anxiety and her nerves and has been confused about her  medications.  The patient has a history of abusing Xanax and amphetamines in  the past.   MEDICATIONS:  Seroquel 100 mg q.h.s., Effexor 150 mg b.i.d., Klonopin 1 mg  q.i.d., Altace, Amaryl, Flovent, Singular and possibly Zyprexa in the past.   ALLERGIES:  SULFA and BACTRIM.   PHYSICAL EXAMINATION:  Essentially unremarkable.  Neurologically nonfocal.   LABORATORY DATA:  Routine admission labs essentially within normal limits.  Mildly elevated glucose at 161, which was not fasting.   MENTAL STATUS EXAM:  The patient is a small-built female, mildly tremulous  with poor eye contact, tearful, anxious and depressed.  Thought process goal  directed, preoccupied with somatic complaints.  Thought content negative for  acute suicidal or homicidal ideation or psychotic symptoms.  Cognitively  intact.  Judgment and insight poor.   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  2. Depression not otherwise specified.   AXIS II:  None.   AXIS III:  1. Chronic obstructive pulmonary disease.  2. Diabetes mellitus, type 2.  3.  Hypertension.  4. Cellulose left lower leg.   AXIS IV:  Moderate (grief over death of the mother).   AXIS V:  36/66.   HOSPITAL COURSE:  The patient was admitted and ordered routine p.r.n.'s and  underwent further monitoring and medical stabilization.  The patient was  restarted on Amaryl, Singular, Keflex, Effexor and Nexium, Seroquel,  trazodone, Prilosec, Lipitor, Altace, Lasix, Klonopin, Vistaril, Flovent,  albuterol.  She was ordered TED hose for history of phlebitis, cellulitis  and lower extremity edema.  The patient was quite somatically preoccupied  initially.  The patient was titrated on Neurontin for control of pain and as  an adjunctive medicine for anxiety and Seroquel was further titrated for  agitation.  Klonopin was optimized as well as Lasix.  The patient was given  compression stockings and TED hose.  Neurontin was further titrated for  continued agitation and anxiety  with a somatic focus.  Aftercare planning  was scheduled as well as family meetings to increase family support.  The  patient reported a positive response to clinical intervention.   CONDITION ON DISCHARGE:  Improved.  Mood was less anxious, less depressed.  Affect brighter.  Thought processes  goal directed.  Thought content  negative for dangerous ideation or psychotic symptoms.  The patient reported  motivation to avoid stimulants and to take medications as prescribed.   DISCHARGE MEDICATIONS:  1. Vistaril 50 mg q.8h. p.r.n. anxiety.  2. Ventolin.  3. Klonopin 1 mg q.i.d.  4. Seroquel 200 mg q.h.s.  5. Lasix 40 mg, 1/2 q.a.m.  6. Neurontin 400 mg t.i.d.  7. Trazodone 100 mg, 2 q.h.s.  8. Lipitor 40 mg q.a.m.  9. Flovent.  10.      Aspirin.  11.      Flonase.  12.      Altace.  13.      Amaryl.  14.      Singular.  15.      Effexor 150 mg, 2 q.a.m.  16.      Protonix 40 mg q.a.m.   FOLLOW UP:  The patient was given two bus passes.  Was to follow up with NA  meetings.  Go to hospice for  grief counseling.  Transportation was set up.  The patient was to follow up with the mental health IOP on May 24, 2002 at 9  a.m.   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  2. Depression not otherwise specified.   AXIS II:  None.   AXIS III:  1. Chronic obstructive pulmonary disease.  2. Diabetes mellitus, type 2.  3. Hypertension.  4. Cellulose left lower leg.    AXIS IV:  Moderate (grief over death of the mother).   AXIS V:  Global Assessment of Functioning on discharge 50-55.                                                 Haskel Khan, M.D.    Lovie Macadamia  D:  07/25/2002  T:  07/29/2002  Job:  [id number]

## 2011-05-07 NOTE — Consult Note (Signed)
[name], [name]                ACCOUNT NO.:  [id number]   MEDICAL RECORD NO.:  [id number]          PATIENT TYPE:  INP   LOCATION:  5114                         FACILITY:  [facility]   PHYSICIAN:  Drue Dun, M.D.       DATE OF BIRTH:  22-Mar-1941   DATE OF CONSULTATION:  DATE OF DISCHARGE:  03/03/2007                                 CONSULTATION   DISCHARGE SUMMARY ADDENDUM:  The patient was discharged on March 03, 2007 to [facility] as previously planned.  The patient remains  in stable medical condition and is discharged on 4 liters of oxygen per  minute which is her previous home dose.  The patient did have a repeat  chest x-ray overnight prior to discharge which showed that her pneumonia  had improved and there was no acute cardiopulmonary process.  The  patient was also given 80 mEq of potassium prior to discharge for a  mildly low potassium on the morning of discharge.   The patient remains medically stable for discharge at this time.  Dr.  Danielle Dess with Neurosurgery has also seen and evaluated the patient.  Based  on her repeat C-spine films performed on March 01, 2007, we recommend  that the patient continue her C-collar use for at least 6 weeks with  followup with him in 6 weeks.           ______________________________  Drue Dun, M.D.     EE/MEDQ  D:  03/03/2007  T:  03/04/2007  Job:  [id number]   cc:   Broadus John T. Pamalee Leyden, MD

## 2011-05-07 NOTE — Discharge Summary (Signed)
[facility]  Patient:    Kristen, [name]                       MRN: [id number] Adm. Date:  [id number] Disc. Date: [id number] Attending:  Lorre Nick Dictator:   Johnella Moloney, MS, RN, NP                           Discharge Summary  HISTORY OF PRESENT ILLNESS:  Mrs. [name] is a 70 year old white divorced female, voluntary admitted to [facility] unit.  Chief complaint:  "I came here to get completely off Xanax and mini thins.  Patient reports she has been helping her 43 year old mother the past year, causing her increased stress.  She has been taking mini thins and over-the-counter diet pills with ephedrine per her description, also abusing Xanax which she obtained from an acquaintance in community, taking approximately 8 1 mg tablets daily for about one year of the Xanax.  Patient now wants to be able to bring her mother back to her mothers own home; however, this is following her mothers hip fracture in October.  Her daughter will not permit her to bring the mother back to the mothers home unless she is free from drugs and the daughter feels safe with her managing her mother.  She recognizes that she needs to be free of the Xanax and free of the other medication in order to be organized and effective.  Not any suicidal or homicidal ideations recently, but she did feel bad and depressed that she could not help her mother, so she wants to prove to herself that she can be off the Xanax.  She did sleep well last night, denied any auditory or visual hallucinations.  On closer questioning, she admits that she has seen some black spots floating around in her vision and they move constantly and they are presently occurring.  She denies seeing any colors, denies hearing any voices.  PAST PSYCHIATRIC HISTORY:  Patient has been treated by Dr. Wynonia Lawman for greater than 5 years for depression and mood swings, and most recent switched to Dr. Betti Cruz  as her attending physician.  Patient admitted to a history of self-inflicted wounds and cuts on herself at age 55.  SUBSTANCE ABUSE HISTORY:  PAST MEDICAL HISTORY:  Primary care physician is Dr. Ardean Larsen.  Medical problems:  Hypertension, diabetes, depression, asthma, COPD, poor circulation in her legs, particularly left leg.  History of pneumonia in November 2001. Current medications:  Altace 10 mg q.d., Amaryl 4 mg q.d., Effexor 150 mg in morning, Singulair 10 mg q.d., doxepin 75 mg 3 tabs at h.s., Topamax 25 mg 2 tablets at h.s., albuterol 4 puffs q.i.d., Lasix 20 mg daily, K-Dur 20 mEq daily, Flovent 220 mg 2 puffs q.a.m. and q.p.m., Nexium 40 mg b.i.d., Flonase nasal spray 2 puffs daily in each nostril.  DRUG ALLERGIES:  BACTRIM and SULFA.  PHYSICAL EXAMINATION:  Please refer to emergency department, which we will order for the records.  She was seen at [facility] on November 28, 2000.  Labs in the emergency department indicate that her alcohol level was less than 5, was positive for benzodiazepines, and positive for tricyclates.  Basically, all lab work was done at [facility].  SOCIAL HISTORY:  MENTAL STATUS EXAMINATION:  Casually dressed, appropriately groomed, 70 year old white female in no acute distress, constantly moving and fidgeting throughout the  interview but was otherwise cooperative and pleasant with good eye contact.  Affect mildly constricted.  Speech is slightly pressured with rapid and clipped responses.  Mood slightly depressed.  Thought process somewhat scattered with some tangential thinking present, but is otherwise coherent and relevant.  She is responsive to questions asked.  She is oriented x 3.  She is somewhat slow to remember the specific on both distant and most recent dates, but otherwise her cognitive function seems intact.  Negative suicidal or homicidal ideation.  Hallucinations seem limited to  presence of some floating black spots in her vision which we will explore later.  ADMISSION DIAGNOSES: Axis I:    1. Depression.            2. Rule out attention deficit hyperactivity disorder.            3. History of bipolar diagnosis. Axis II:   Deferred. Axis III:  Chronic obstructive pulmonary disease, hypertension, diabetes,            asthma. Axis IV:   Severe, related to problems with primary support group, social            environment and medical problems. Axis V:    Current global assessment of functioning 25, highest in past year            is 55.  HOSPITAL COURSE:  Patient was admitted to the [facility] unit for treatment of her depression and also to treat her benzodiazepine dependence.  We continued her regular medications, including Altace, Amaryl, Singulair, Lasix, K-Dur, doxepin 75 mg 3 at bedtime.  We also did a Klonopin taper, 0.5 mg p.o. q.i.d. x 2 days.  Doxepin level in the morning, Seroquel 25 mg p.o. h.s. p.r.n. sleep if absolutely necessary.  On December 30, we added Topamax 50 at h.s., Nexium 40 mg b.i.d.  We also felt like we would add Depakote ER 500 mg h.s. p.o., Zyprexa 2.5 mg p.o. h.s.  We stopped the Topamax, discontinued the Flovent order, and changed the Flovent to 20 mEq 2 puffs b.i.d., albuterol 90 mEq per puff, 2 puffs q.i.d. p.r.n. and we also ordered CBGs b.i.d. and we continued her nebulizer treatments.  On January 1, we gave her p.r.n. Seroquel 25 mg q.6h. for agitation, along with Trazodone 25 mg h.s. for sleep, if not asleep in one hour could repeat.  We increased the Trazodone to 150 at h.s. and the Zyprexa to 5 mg and did do a Depakote level. The continued to increase the Trazodone and Seroquel, gave her Nicoderm patch to help her not smoke.  While she was in the hospital, she slept well and she said she was feeling somewhat better.  Sometimes she was jittery and nervous. She was on amphetamines and Xanax for a year, "I was  distressed and I was and I tried to do so many things for so many people."  She feels like mother is stable and is expecting the patients daughter to help her.  She slept well.  We continued the detox of the benzodiazepines.  She did fairly well except for the problem sleeping, and we increased the Trazodone and Seroquel.  Sometimes she was tearful when talking about her family, but otherwise she was doing quite well.  She continued to do well with the detox, and she felt like she was feeling better.  She continued to become tearful when discussing her mothers medical problems.  Overall, she was less depressed.  No suicidal  ideation.  Speech was a little rapid.  Sleeping and eating well.  Her Depakote level was 36.7 and we discussed discharge plans, and she wanted to follow up again with Dr. Wynonia Lawman.  On January 8, it was felt like she was optimally improved and she was tolerating her medications well and with benefit, and it was felt that she could be managed on an outpatient basis.  Detoxed successfully.  Mood and affect were bright, denied further suicidal or homicidal ideations, felt she could be managed on an outpatient basis.  CONDITION ON DISCHARGE:  Discharged in improved condition, with improvement in her mood, sleep, appetite.  No suicidal or homicidal ideations.  Improvement in energy.  Detoxed successfully.  DISPOSITION:  Patient is discharged to home.  FOLLOW UP:  Patient is to follow up with Dr. Betti Cruz January 23 at 3:45 p.m.  We advised her to follow up with Depakote levels, CBC, liver profiles and serum amylase, and not to go back on the medications that she had been abusing.  DISCHARGE MEDICATIONS: 1. Nicoderm patch 7 mg daily. 2. Seroquel 100 mg once at bedtime. 3. Trazodone 100 mg two at bedtime. 4. Seroquel 15 mg 1 q.i.d. 5. Effexor XR 150 mg 1 daily. 6. Depakote ER 500 mg 1 at bedtime.  She is also to continue her regular    medical medications that her primary  care doctor had placed her on.  FINAL DIAGNOSES: Axis I:    1. Depression.            2. Rule out attention deficit hyperactivity disorder.            3. History of bipolar diagnosis.            4. Benzodiazepine dependence. Axis II:   Deferred. Axis III:  Chronic obstructive pulmonary disease, hypertension, diabetes,            asthma. Axis IV:   Moderate, related to problems with her mom being medically ill. Axis V:    Current global assessment of functioning at discharge 50, highest            in past year 55. DD:  01/19/01 TD:  01/19/01 Job: [id number]

## 2011-05-07 NOTE — H&P (Signed)
NAMECYNAI, SKEENS                          ACCOUNT NO.:  [id number]   MEDICAL RECORD NO.:  [id number]                   PATIENT TYPE:  INP   LOCATION:  5743                                 FACILITY:  [facility]   PHYSICIAN:  Sibyl Parr. Fields, M.D.                DATE OF BIRTH:  08-26-1941   DATE OF ADMISSION:  05/09/2003  DATE OF DISCHARGE:                                HISTORY & PHYSICAL   PRIMARY CARE PHYSICIAN:  Kevin Fenton, M.D.   CHIEF COMPLAINT:  Increasing shortness of breath and cough.   HISTORY OF PRESENT ILLNESS:  This 70 year old, white female with history of  COPD, anxiety, hypertension and diabetes presented to the [facility]  Emergency Department via EMS with complaints of increasing shortness of  breath and cough today.  She normally has wheeze and cough, but her  shortness of breath became worse today after shopping and became overheated  outside.  She attempted to walk home and stopped at the donut shop for a  glass of water and became more dyspneic and asked the manager to call EMS.  She does state that her sputum production has not increased in amount, but  has changed in color and is now yellowish-green for the past four days.  She  denies fever and states that she has had chills today.  The patient does not  use oxygen at home.  En route, the patient was given two albuterol nebulizer  treatments and in the emergency department, she was given Solu-Medrol,  Klonopin, Percocet and one albuterol and Atrovent treatment.   REVIEW OF SYMPTOMS:  Per HPI as well as positive for constipation, but the  patient had a bowel movement today, back pain with cough and history of  anxiety.  Negative for weight loss, chest pain, palpitations, dysuria or  active bleeding.   PAST MEDICAL HISTORY:  1. COPD.  2. Diabetes mellitus type 2.  3. Hyperlipidemia.  4. Depression.  5. Anxiety.  6. Hypertension.  7. Obesity.  8. Osteopenia.  9. Tobacco abuse.  10.      History  of DVT, currently on anticoagulation.   MEDICATIONS:  1. Altace 10 mg q.d.  2. Amaryl 4 mg q.d.  3. Aspirin 81 mg q.d.  4. Combivent two puffs q.i.d.  5. Coumadin 10 mg q.d. (has not taken yet today).  6. Effexor 300 mg q.d.  7. Klonopin 2 mg q.i.d.  8. Lipitor 40 mg q.d.  9. Neurontin 400 mg t.i.d.  10.      Nexium 40 mg q.d.  11.      Pulmicort inhaler plus Xopenex and Atrovent nebulizer.  12.      Seroquel 300 mg q.h.s.  13.      Singulair 10 mg q.d.  14.      Trazodone 200 mg p.o. q.h.s.  15.      Flonase two sprays q.d.  16.  Lasix 40 mg b.i.d.   ALLERGIES:  BACTRIM DS causes a rash.   SOCIAL HISTORY:  The patient lives alone in an apartment in [place] and  recently had a home health nurse who helped her with housework, but not for  the past week.  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  She is divorced and has a 20-pack-year history of tobacco and now  smokes three cigarettes per day.   FAMILY HISTORY:  Significant for coronary artery disease, diabetes and  strokes.  No cancer.   PHYSICAL EXAMINATION:  VITAL SIGNS:  Temperature 98.2, pulse 119,  respirations 24, blood pressure 113/65, oxygen saturation 88-92% on 2 L  nasal cannula.  GENERAL:  This is a well-nourished, well-developed, anxious, white female in  mild respiratory distress who speaks in short sentences who is alert and  oriented x3.  HEENT:  Normocephalic, atraumatic.  PERRLA.  EOMI.  Sclerae nonicteric.  Nares with nasal cannula in place.  Teeth are missing.  Tongue appears  fissured.  Oropharynx without erythema or exudate.  NECK:  Supple with no lymphadenopathy.  Full range of motion.  No  thyromegaly.  LUNGS:  The patient appears tachypneic, but no nasal flaring or accessory  muscle use.  Auscultation revealed expiratory wheezes bilaterally with  increased expiratory phase and decreased air exchange.  HEART:  Regular rate and rhythm with distant S1, S2 heard.   EXTREMITIES:  No clubbing or cyanosis.  Trace edema in bilateral lower  extremities.  ABDOMEN:  Obese, soft, nontender, nondistended, normoactive bowel sounds, no  hepatosplenomegaly appreciated.  SKIN:  Multiple ecchymoses on bilateral arms.  No active bleed.  NEUROLOGIC:  Cranial nerves 2-12 grossly intact.  Deep tendon reflexes 2+  bilaterally.  Finger-to-nose test within normal limits.  RECTAL:  Good tone.  Heme negative.   LABORATORY DATA AND X-RAY FINDINGS:  Sodium 138, potassium 3.3, chloride  103, bicarb 29, BUN 12, creatinine 0.7, glucose 177, calcium 8.2.  WBC 9.7,  hemoglobin 10.6, hematocrit 32.7, platelets 252 with 67% neutrophils, 23%  lymphs, MCV 86.3.  ABG revealed pH 7.39, pCO2 53.5, pO2 62 and bicarb 32.  PT 13.6, INR 1.0.   Chest x-ray shows bibasilar atelectasis versus infiltrate.  EKG shows sinus  tachycardia, but no ischemic changes similar to prior study.   ASSESSMENT/PLAN:  This is a 70 year old, white female with anxiety, diabetes  and hypertension who presents with shortness of breath and cough.  1. Pulmonary, decreased work of breathing appearing to be secondary to     exertion and dehydration today, but with history of sputum color change     and chest x-ray findings, will start azithromycin and ceftriaxone for     possible infectious etiology for this chronic obstructive pulmonary     disease exacerbation.  Continue Xopenex and Atrovent nebulizers.     Continue Pulmicort and the patient already received Solu-Medrol in the     emergency room.  The patient has been on multiple courses of steroids     recently for chronic obstructive pulmonary disease exacerbation and may     be a candidate for long-term oral steroids and will discuss this with Dr.     Darrick Penna in the morning.  2. Normocytic anemia, may be secondary blood loss as the patient is on     Coumadin.  Hemoglobin on Apr 21, 2003, was 11, but heme negative stool    today.  Check capillary blood glucose  in the  morning and follow trend.  3. Hyperkalemia.  Will orally replace and check a basic metabolic panel in     the morning.  4. Diabetes mellitus type 2.  Blood sugar was stable on basic metabolic     panel and will continue Amaryl.  5. Hypertension, well-controlled on Altace.  Will monitor blood pressure.  6. Anxiety, worsened with increased dyspnea and primary physician recently     increased dose of Klonopin.  Will continue Klonopin and other medications     used at home.  7. History of deep venous thrombosis, subtherapeutic on Coumadin 10 mg     daily.  It seems to be a very high dose, so will give first dose tonight     and adjust according to daily international normalized ratio.     Billey Gosling, M.D.                       Sibyl Parr. Darrick Penna, M.D.    AS/MEDQ  D:  05/09/2003  T:  05/10/2003  Job:  [id number]   cc:   Kevin Fenton, M.D.  [facility]  [place]  [place] [address]  Fax: [phone]

## 2011-05-07 NOTE — Discharge Summary (Signed)
NAMELAUREN, [name]                          ACCOUNT NO.:  [id number]   MEDICAL RECORD NO.:  [id number]                   PATIENT TYPE:  INP   LOCATION:  5707                                 FACILITY:  [facility]   PHYSICIAN:  [name], M.D.                DATE OF BIRTH:  1941/02/04   DATE OF ADMISSION:  11/19/2002  DATE OF DISCHARGE:  11/21/2002                                 DISCHARGE SUMMARY   DISCHARGE DIAGNOSES:  1. Chronic obstructive pulmonary disease exacerbation.  2. Diabetes mellitus type 2, stable.  3. Anxiety/depression disorder.  4. Hyperlipidemia.  5. Hypertension.  6. History of substance dependence (ephedrine abuse).   DISCHARGE MEDICATIONS:  1. Amoxicillin 500 mg one p.o. t.i.d. x5 days.  2. Prednisone 40 mg p.o. q.d. x2 additional days.  3. Tylenol No. 3 one to two q.4h. p.r.n. heel pain.  4. Klonopin 1 mg tablets may take 2 mg q.i.d. only while taking prednisone.  5. Albuterol nebulizer two puffs q.6h. p.r.n.  6. Atrovent nebulizer treatments q.6h.  7. The patient is instructed to continue the rest of her medications as     previously prescribed which include:  Altace, Amaryl, Effexor, Flovent, Lasix, Lipitor, Neurontin, Nexium,  trazodone, Seroquel, and Singulair.   DISCHARGE INSTRUCTIONS:  The patient is to continue her diabetic diet.  Otherwise no restrictions on activity.   FOLLOW UP:  1. The patient is instructed to call and make a follow-up appointment with     Dr. Susann [name] within one week.  Phone number was provided at [phone].  2. The patient was instructed to keep her previously scheduled appointment     with Dr. Betti [name].   HISTORY OF PRESENT ILLNESS:  The patient is a 70 year old female with  history of COPD, diabetes and anxiety/depression who presented with a four  day history of increased shortness of breath, cough and wheezing.  She was  admitted for management of an acute COPD exacerbation.  Her chest x-ray  showed no active disease.   She was initiated on prednisone (a first dose of  Solu-Medrol was given due to vomiting), amoxicillin, and albuterol nebulizer  treatments q.4, q.2 p.r.n., and Atrovent q.6h. nebulizer treatments.  At  admission, she had an oxygen requirement. She was 92% on two liters.  Examination significant for bilateral end expiratory wheezes throughout.   HOSPITAL COURSE:  The patient improved from a respiratory standpoint.  At  discharge she was saturating 99% on room air.   During the hospital course the patient complained of increasing anxiety.  With negotiation with Dr. Deirdre [name], her Klonopin which is 1 mg q.i.d. was  increased to 2 mg q.i.d. for only the time for which she is taking the  prednisone.  This was only a short term increase and she was only given a  prescription for the difference of this short term increase.  Otherwise she  was maintained on her  other psychiatric medications as previously.   Her diabetes and hypertension were without any active issues during the  hospital course.  A hemoglobin A1C was drawn and the results are pending at  discharge.   The patient was discharged to home. She said she was feeling better.  She  had some complaint of bilateral foot pain which we attributed to bone  spurs.  For this, she is given some Tylenol No. 3 at discharge.  Otherwise  she was given the prescriptions as above and to follow up with Dr. Susann [name]  in one week.                                               [name], M.D.    CH/MEDQ  D:  11/21/2002  T:  11/21/2002  Job:  [id number]   cc:   [name], M.D.  [facility]  [place]  [place] [address]  Fax: [phone]   [name], M.D.  [address]  [place]  [place] [address]  Fax: [phone]

## 2011-05-07 NOTE — H&P (Signed)
[name], [name]                ACCOUNT NO.:  [id number]   MEDICAL RECORD NO.:  [id number]          PATIENT TYPE:  INP   LOCATION:  2906                         FACILITY:  [facility]   PHYSICIAN:  Leighton Roach McDiarmid, M.D.DATE OF BIRTH:  [birth year]   DATE OF ADMISSION:  12/24/2005  DATE OF DISCHARGE:                                HISTORY & PHYSICAL   HISTORY OF PRESENT ILLNESS:  This is a 70 year old female with COPD,  diabetes mellitus and hypertension who presents with 1-1/2 week history of  increasing shortness of breath and dyspnea even at rest, increased sputum  production.  The patient was seen at the [facility] and was given Augmentin and  Prednisone.  Over the past week, the patient has been seen several times at  the [facility] ED because of persistence of symptoms.  She was given Solu-  Medrol and albuterol nebs x 5 at the [facility] ED and was subsequently  sent home.  However because of worsening dyspnea, the patient returned to  the [facility] ED today and was subsequently transferred to [facility].   PAST MEDICAL HISTORY:  1.  COPD.  The patient is on home O2 at 3 liters per minute at bedtime.  2.  Hypertension.  3.  Diabetes mellitus type 2.  4.  Hyperlipidemia.  5.  Depression and anxiety.  6.  History of pulmonary embolism.  7.  Cataracts.   MEDICATIONS:  1.  Actos 45 mg daily.  2.  Xopenex 1.25 mg q.i.d.  3.  Altace 10 mg p.o. daily.  4.  Amaryl 8 mg p.o. daily.  5.  Aspirin daily.  6.  Atrovent nebs 500 mcg q.i.d.  7.  Coumadin as directed.  8.  Flonase 2 puffs per nostril daily.  9.  Glucophage 1000 mg b.i.d.  10. Klonopin 2 mg in the a.m. and 1 mg at lunch and dinner.  11. Lasix 40 mg daily.  12. Neurontin 400 mg t.i.d.  13. Nexium 40 mg daily.  14. Nicotine patch 21 mg daily.  15. Pulmicort respules/per mg b.i.d.  16. Effexor 300 mg at bedtime.  17. Singulair 10 mg daily.  18. Trazodone 300 mg at bedtime.  19. Vytorin 10/40 mg p.o. daily.   ALLERGIES:  TRIMETHOPRIM.   PAST SURGICAL HISTORY:  1.  Cholecystectomy in 1981.  2.  Hysterectomy for cervical CA.   FAMILY HISTORY:  CAD in father.  Diabetes mellitus.  CVA.   PERSONAL/SOCIAL HISTORY:  The patient is on disability and lives by herself.  She has a caregiver who is close to her home several hours a day to assist  her.  Previously was a smoker, but has quit since November 2005.   REVIEW OF SYSTEMS:  Positive for dyspnea, shortness of breath, cough  productive of yellowish to greenish sputum, nausea.  Negative for fever,  chest pain, vomiting, lower extremity edema, urinary or bowel disturbances.   PHYSICAL EXAMINATION:  VITAL SIGNS:  Blood pressure 139/85, heart rate of  121.  Respiratory rate of 24.  Temperature of 99.3.  O2 saturation is 95%  at  2 liters per minute.  GENERAL:  The patient is awake, alert, coherent, oriented, speaking in short  sentences in moderate respiratory distress.  HEENT:  Slightly pale conjunctivae.  Anicteric sclerae.  No tonsillar or  pharyngeal congestion.  NECK:  No jugular venous distention.  No carotid bruit.  No thyromegaly.  RESPIRATORY:  Increased work of breathing with use of accessory muscles.  Poor air entry. Prolonged expiratory phase with diffuse wheezing.  No  crackles or rhonchi.  CVS:  Tachycardic.  Regular rhythm.  ABDOMEN:  Normal bowel sounds, soft.  No masses or tenderness.  EXTREMITIES:  No edema.  No cyanosis.  Pedal pulses on left.  NEUROLOGICAL:  Cranial nerves intact.  Motor strength 5/5 in both upper and  lower extremities.  Sensory intact.  Cerebellar and gait not assessed.   LABORATORY DATA:  Hemoglobin 9.6, hematocrit 30, WBC 10.0, platelets 304.  Sodium 137.  Potassium 3.2.  Chloride 102.  CO2 29.  BUN 7, creatinine 0.7,  glucose 152.  PT 29.3, INR 2.8.  EKG shows sinus tachycardia.  X-rays showed  borderline central airway thickening consistent with bronchitis or reactive  airway disease.  ABG pending.    ASSESSMENT:  This is a 70 year old female with COPD admitted for:  1.  COPD exacerbation.  The patient has failed outpatient treatment for COPD      exacerbation with persistence of dyspnea in the presence of increased      sputum production.  She has several risk factors for poor outcome namely      COPD frequent exacerbations about 10 x/year and recent antibiotic use.      Patient to undergo stratification to determine antibiotic use.  She      would be in group B.  Will start Avalox as patient's symptoms have not      improved with a previous course of Augmentin.  Will give prednisone 40      mg p.o. daily, Xopenex and Atrovent nebulization.  Will also check ABG      as patient's symptoms worsen and ABG shows acidosis and hypercapnia.      Will give a trial of BiPAP.  Will maintain O2 at 3 liters per minute and      keep O2 sats above 88%.  2.  Diabetes mellitus type 2.  Continue Actos, Amaryl and Glucophage at home      dose.  Will monitor CBG and cover with sliding scale insulin as patient      is on steroids.  Will check a hemoglobin A1C.  3.  Hypertension.  Continue Altace at home dose.  4.  Depression on Effexor 200 mg at bedtime.  5.  History of pulmonary embolism.  Continue Coumadin.  Will check INR in      a.m.  6.  Nicotine abuse. Continue nicotine patch 21 mg daily.  7.  Anemia with guaiac stools.  Repeat CBC in a.m.  8.  Hyperlipidemia.  Continue Vytorin at home dose.  9.  Fluids, electrolytes, nutrition.  May have clear liquids for now.  Will      replace potassium IVF/NSF at 100 cc/hour.  10. Code status will be discussed with the patient and family.      Lawerance Sabal, MD    ______________________________  Leighton Roach McDiarmid, M.D.    MC/MEDQ  D:  12/24/2005  T:  12/24/2005  Job:  [id number]   cc:   Broadus John T. Pamalee Leyden, MD  Fax: [phone]

## 2011-05-07 NOTE — H&P (Signed)
[name], [name]                ACCOUNT NO.:  [id number]   MEDICAL RECORD NO.:  [id number]          PATIENT TYPE:  INP   LOCATION:  5732                         FACILITY:  [facility]   PHYSICIAN:  Dwana Curd. Para March, M.D. DATE OF BIRTH:  August 17, 1941   DATE OF ADMISSION:  12/01/2006  DATE OF DISCHARGE:                              HISTORY & PHYSICAL   CANCELLED DICTATION      Dwana Curd. Para March, M.D.     GSD/MEDQ  D:  12/02/2006  T:  12/02/2006  Job:  [id number]

## 2011-05-07 NOTE — Discharge Summary (Signed)
NAMEKINNEDY, [name]                ACCOUNT NO.:  [id number]   MEDICAL RECORD NO.:  [id number]          PATIENT TYPE:  INP   LOCATION:  5740                         FACILITY:  [facility]   PHYSICIAN:  Dwana Curd. Para March, M.D. DATE OF BIRTH:  [birth year]   DATE OF ADMISSION:  11/06/2004  DATE OF DISCHARGE:                                 DISCHARGE SUMMARY   ANTICIPATED DATE OF DISCHARGE:  November 15, 2004.   DISCHARGE DIAGNOSES:  1.  Pulmonary embolus.  2.  Chronic obstructive pulmonary disease.  3.  Diabetes mellitus.  4.  Increased lipids.  5.  Anxiety.  6.  Tobacco abuse.   DISCHARGE MEDICATIONS:  1.  Actos 30 mg p.o. per day.  2.  Xopenex 1.25 mg nebulized inhaled four times a day.  3.  Altace 10 mg p.o. daily.  4.  Amaryl 8 mg p.o. daily.  5.  Aspirin 81 mg p.o. daily.  6.  Atrovent nebulizer 500 mcg used four times a day.  7.  Effexor 300 mg p.o. at night.  8.  Glucophage 1000 mg p.o. twice a day.  9.  Lasix 40 mg p.o. daily.  10. Lipitor 40 mg p.o. daily.  11. Neurontin 400 mg three times a day with an extra 1200 mg at night.  12. Nexium 40 mg p.o. daily.  13. Pulmicort Respules 2 mg p.o. daily.  14. Seroquel 400 mg p.o. at night.  15. Singulair 10 mg p.o. daily.  16. Klonopin 2 mg each morning, 1 mg before lunch and dinner.  17. Nicotine patch 14 mg per day, started November 06, 2004.  Please      consider taper to 7 mg per day patch on November 19, 2004.  18. The patient is to call for labs at [facility] on November 16, 2004, for a PT-      INR draw.  19. The patient also needs a hospital followup with her PCP, Dr. Virgia Land.      She is to call and schedule this.   PROCEDURES AND DIAGNOSTIC STUDIES:  1.  EKG - no ST changes.  2.  Chest CT - small right pulmonary embolus.  3.  Chest x-ray showing signs of COPD and bilateral atelectasis.   CONSULTATIONS:  None.   ADMISSION HISTORY AND PHYSICAL:  The patient is a 70 year old female with a  history of COPD and a history of  DVT, who presents to a local ER with  respiratory distress and shortness of breath that began at approximately 4  p.m. today.  She reported wheezing and overall feeling bad x1 week.  She  has a chronic cough, but no changes in phlegm, and she reports often with  shortness of breath with her COPD relieved by nebs.  However, she states  that the episode today she thought was slightly worse.  Initial laboratory  studies, as stated above, show a chest CT with a small, right, pulmonary  embolus.   HOSPITAL COURSE:  1.  Pulmonary embolus:  The patient was admitted and heparinized and started      on Coumadin.  [name]  INR was slow to trend up after six days of      therapy with Coumadin.  On the seventh day of therapy, the patient's INR      increased to 2.9.  [name] dose was then decreased to 5 mg of      Coumadin p.o. q.h.s. on November 14, 2004.  Dosing and management for      this drug were completed by Pharmacy.  There were no signs nor symptoms      consistent with bleeding during this hospitalization, and the patient's      H&H  were stable.  Risks and benefits of treatment with Coumadin were      discussed with the patient.  At time of dictation, it is anticipated the      patient will be discharged tomorrow with a stable PT-INR.  The patient      is to follow up at [facility] for a lab draw for      PT-INR, for the result to be called back to Dr. Virgia Land.  2.  COPD:  The patient has a history of COPD and was treated as if to have a      COPD exacerbation during this hospitalization.  She was treated with      initial bursts of steroids that did not require a taper.  Also, the      patient was treated with nebulizers during this hospitalization.  Her      pulmonary function recovered to baseline during this hospitalization.      At the time of dictation, the patient was stable.  3.  Diabetes mellitus:  The patient has a history of diabetes mellitus  and      was continued on her home medications.  The patient had moderate success      with her glycemic control during this hospitalization, though the      patient was certainly not at goal, though this may be due to the fact of      the steroid burst that the patient received during this hospitalization.      It is left up to the PCP for further management of the patient's      diabetic regimen.  4.  Increased lipids:  The patient has a history of increased lipids and was      continued on her home regimen during this hospitalization without      complication.  5.  The patient has a history of anxiety, receiving multiple psychiatric      drugs.  [name] Ativan was discontinued, and the patient was      started on Klonopin 2 mg each morning and 1 mg at lunch and dinner.  At      the time of dictation, the patient's anxiety was at a baseline level.  6.  Tobacco abuse:  The patient has a history of tobacco abuse and was      started on a nicotine replacement patch during this hospitalization at      14 mg per day.  It is suggested that the patient be tapered down to 7 mg      per day in December of 2005.   DISCHARGE LABORATORY DATA:  Notable for intake INR of 0.9, increasing to 1  on November 19th, increasing to 1.5 on November 21st, increasing to 1.7 on  November 23rd, 1.9 on the 25th, and 2.9 on the 26th.  GSD/MEDQ  D:  11/14/2004  T:  11/14/2004  Job:  [id number]   cc:   Alvira Philips, M.D.  [facility].  [facility] Resident  [place]  [place] [address]  Fax: [phone]

## 2011-05-07 NOTE — H&P (Signed)
[facility]  Patient:    DASHANTI, BURR Visit Number: [id number] MRN: [id number]          Service Type: EMS Location: [facility] [place] Attending Physician:  Carmelina Peal Dictated by:   Kevin Fenton, M.D. Admit Date:  04/28/2002 Discharge Date: 04/29/2002                           History and Physical  There was no dictation for this report. Dictated by:   Kevin Fenton, M.D. Attending Physician:  Carmelina Peal DD:  04/29/02 TD:  04/30/02 Job: [id number]

## 2011-05-07 NOTE — Consult Note (Signed)
[name], [name]                ACCOUNT NO.:  [id number]   MEDICAL RECORD NO.:  [id number]          PATIENT TYPE:  INP   LOCATION:  5511                         FACILITY:  [facility]   PHYSICIAN:  Antonietta Breach, M.D.  DATE OF BIRTH:  1941-04-23   DATE OF CONSULTATION:  01/13/2007  DATE OF DISCHARGE:  01/14/2007                                 CONSULTATION   REASON FOR CONSULTATION:  Suicidal thoughts; rule out risk of suicide.   HISTORY OF PRESENT ILLNESS:  [name] is a 70 year old female  admitted to the [facility] on 01/12/2007 due to a nosebleed.  The  patient has been plagued with frequent nose bleeds.  She was assessed to  have an INR of 10.2.  She has been given vitamin K and admitted to a  medical floor.   During the admission process, the patient verbalized suicidal thought.  However she currently states that this was only a figure of speech.  She  was expressing her frustration with the nose bleeds.  She does have  intact constructive future goals and a normal level of interests.  Her  energy has been mildly decreased lately.  Her sleep is within normal  limits.   She has been maintained on her psychotropic preventive medication  including:  1. Haldol 1 mg daily.  2. Trazodone 300 mg q.h.s.  3. Neurontin 400 mg t.i.d. and 1200 mg q.h.s.  4. Effexor XR 300 mg q.a.m.  5. Klonopin 1 mg t.i.d.  6. Topamax 200 mg q.h.s.   PAST PSYCHIATRIC HISTORY:  The patient does have a history of multiple  psychiatric admissions to the [facility] due to major  depressive episodes.   During September of 2004, the patient was admitted for Klonopin detox.  She was also depressed after her mother passed away.  She was treated  for sedative hypnotic dependence.  She underwent a Librium detox  protocol.  She also was treated for her depression and was discharged on  Effexor 300 mg q.a.m. along with trazodone 200 mg q.h.s.  She was on  Seroquel 300 mg q.h.s.  at bedtime along with Neurontin 400 mg t.i.d. and  300 mg q.h.s.   FAMILY PSYCHIATRIC HISTORY:  Unknown.   SOCIAL HISTORY:  [name] is retired.  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  Marital status divorced.  Children:  Two daughters.  The patient is on  disability and has been residing by herself in an apartment in  [place].  She is on medical disability due to chronic obstructive  pulmonary disease.  She does have CNA visits to her house.  She has no  alcohol use or illegal drug use.  Please see the discussion of sedative  hypnotic dependence above and her detox treatment.   PAST MEDICAL HISTORY:  1. Chronic anti-coagulation for a history of pulmonary embolism in      November of 2005.  2. Type- 2 diabetes.  3. Hyperlipidemia.  4. Chronic obstructive pulmonary disease.  5. Hypertension.  6. Osteopenia.  7. Obesity.  8. Thrombophlebitis.  9. History of cataracts.   SURGICAL  HISTORY:  Also includes cholecystectomy and hysterectomy for  cervical cancer.   MEDICATIONS:  The MAR is reviewed.  The patient is on psychotropics as  listed in the history of present illness.  She denies any adverse  effects.   ALLERGIES:  QUINIDINE, TRIMETHOPRIM, SULFAMETHOXAZOLE, SULFA.   LABORATORY DATA:  WBC 4, hemoglobin 9.5, platelet count 193, INR 1.2.  Metabolic panel shows the glucose elevated at 172, BUN 7, creatinine  0.57.  Head CT without contrast on December 24, 2006, showed no acute  intracranial abnormality.   REVIEW OF SYSTEMS:  CONSTITUTIONAL:  Afebrile.  No weight loss.  HEAD:  No trauma.  EYES:  No visual changes.  EARS:  No hearing impairment.  NOSE:  No rhinorrhea.  MOUTH/THROAT:  No sore throat.  The patients nose  bleeds have stopped.  NEUROLOGIC:  Unremarkable.  PSYCHIATRIC:  As  above.  CARDIOVASCULAR:  No chest pain, palpitations.  RESPIRATORY:  No  coughing or wheezing.  GASTROINTESTINAL:  No nausea, vomiting, diarrhea.  GENITOURINARY:  No dysuria.  SKIN:  Unremarkable.   HEMATOLOGIC/LYMPHATIC:  Anemia.  ENDOCRINE/METABOLIC:  As above.  MUSCULOSKELETAL:  No deformities.   PHYSICAL EXAMINATION:  VITAL SIGNS:  Temperature 98.2, pulse 94,  respiration 20, blood pressure 111/60, O2 saturation on 2 liters nasal  cannula 98%.  GENERAL APPEARANCE:  [name] is an elderly female, appearing her  chronologic age of 13, lying in a supine position in her hospital bed,  in no apparent distress.  Her eye contact is good.  She is socially  appropriate.  She has normal body habitus and no abnormal involuntary  movement.  She is well-groomed.  OTHER MENTAL STATUS:  Attention span is within normal limits.  She is  alert.  Her concentration is within normal limits.  She is oriented to  all spheres.  Her memory is intact to immediate, recent and remote.  Speech involves normal rate and prosody without dysarthria.  Fund of  knowledge and intelligence are within normal limits.  Affect is slightly  anxious at baseline but with a broad appropriate response.  Mood is  within normal limits.  Thought process logical, coherent, goal-directed.  No looseness of associations.  Calculation and abstraction ability are  intact.  Thought content reveals no thoughts of harming herself, no  thoughts of harming others, no delusions, no hallucinations.  Insight is  intact.  Judgment is intact.   ASSESSMENT:  AXIS I:  293.83, mood disorder not otherwise specified, depressed, stable  (functional and general medical factors with ongoing chronic obstructive  pulmonary disease).  293.84, anxiety disorder not otherwise specified.  History of sedative hypnotic dependence.  AXIS II:  Deferred.  AXIS III:  See general medical problems.  AXIS IV:  General medical primary support group.  AXIS V:  55.   [name] is not at risk to harm herself or others.  She agrees to call  emergency services immediately for any thoughts of harming herself,  thoughts of harming others or distress.   The  undersigned provided education and ego supportive psychotherapy.  Potential adverse effects were discussed regarding all of her  psychotropic medication.  The fact that she has required multiple trials  and revisions of her psychotropic regimen in order to achieve stability  is evident.  Also it is evident that a more simplified regimen has also  been tried without success achieved.  She is currently having a  consistent stability on this regimen.   However the undersigned did point  out that reducing trazodone to the  lowest dosage that will allow normal sleep would be indicated to avoid a  serotonin syndrome, given that trazodone and Effexor both have serotonin  re-uptake inhibition as part of their pharmacodynamics.  The patient  understands and would like to reduce her trazodone.   RECOMMENDATIONS:  Would reduce the trazodone to 200 mg q.h.s. at the  first step and then continued to try further reductions to achieve the  minimum trazodone doses that will maintain sleep.   The patient will continue on her Effexor at 300 mg q.a.m. for anti-  depression and anxiety.  Would ensure that this is not complicating her  blood pressure.   FOLLOWUP:  Have the patient see her psychiatrist within the first week  of discharge.      Antonietta Breach, M.D.  Electronically Signed     JW/MEDQ  D:  06/28/2007  T:  06/29/2007  Job:  [id number]

## 2011-05-07 NOTE — H&P (Signed)
[name], [name]                ACCOUNT NO.:  [id number]   MEDICAL RECORD NO.:  [id number]          PATIENT TYPE:  INP   LOCATION:  5511                         FACILITY:  [facility]   PHYSICIAN:  [name], M.D.DATE OF BIRTH:  04-May-1941   DATE OF ADMISSION:  01/12/2007  DATE OF DISCHARGE:                              HISTORY & PHYSICAL   PRIMARY CARE PHYSICIAN:  Kristen John T. Pamalee Leyden, MD at [facility].   HISTORY OF PRESENT ILLNESS:  This is a 70 year old white female with  multiple medical problems and a history of noncompliance with frequent  ED visits who presented to [facility] ED this morning with a nosebleed  that began this morning.  The patient was found there to have a  supratherapeutic INR of greater than 10.2 and was given 10 mg of vitamin  K IV and 1 unit of FFP prior to transfer for admission.  [name]  nosebleed has now stopped upon arrival at [facility].  The  patient, of note, also complains of chronic neck and back pain and has  had cold symptoms for a few days with sore throat and productive cough  but otherwise is in her usual state of health with her shortness of  breath being at baseline.   REVIEW OF SYSTEMS:  CONSTITUTIONAL:  The patient denies fever or chills.  CARDIOVASCULAR:  The patient denies chest pain, edema, or palpitations.  PULMONARY:  The patient denies shortness of breath above baseline.  Does  endorse sore throat and productive cough of yellowish sputum.  GASTROINTESTINAL:  Patient denies bright red blood per rectum, abdominal  pain.  Does endorse constipation and melanotic stools occasionally of  unknown duration.  GENITOURINARY:  Patient denies dysuria or hematuria.  SKIN:  Patient has multiple bruises.  MUSCULOSKELETAL:  The patient has  chronic neck and back pain secondary to degenerative joint disease.  The  remainder of review of systems is unremarkable.   PAST MEDICAL HISTORY:  1. Chronic  anticoagulation for a history of pulmonary embolism in      November of 2005.  2. Type 2 diabetes.  3. Hyperlipidemia.  4. Bipolar disorder.  5. Anxiety disorder.  6. Hypertension.  7. COPD.  8. Osteopenia.  9. Obesity.  10.Thrombophlebitis.  11.A history of cataracts.  12.Cardiolite scan in August of 2007 showed low risk.   PAST SURGICAL HISTORY:  1. Status post cholecystectomy.  2. Status post hysterectomy for cervical cancer.   HOME MEDICATIONS:  1. Actos 45 mg p.o. daily.  2. Advair Diskus 250/50 inhaled b.i.d.  3. Haldol 1 mg p.o. daily.  4. Trazodone 300 mg p.o. daily.  5. Neurontin 400 mg p.o. t.i.d. and 1200 mg p.o. q.h.s.  6. Effexor 300 mg p.o. every morning.  7. Klonopin 1 mg p.o. t.i.d.  8. Topamax 200 mg p.o. q.h.s.  9. Actos 45 mg p.o. daily.  10.Amaryl 8 mg p.o. daily.  11.Glucophage 1000 mg p.o. b.i.d.  12.Albuterol MDI one puff inhaled 4 times daily as needed.  13.Coumadin 10 mg p.o. daily.  14.Enalapril 40 mg  p.o. daily.  15.Ferrous sulfate 325 mg p.o. t.i.d.  16.Lasix 40 mg p.o. daily.  17.Prilosec 20 mg p.o. daily.  18.Vytorin 10/40 mg p.o. daily.   ALLERGIES:  Patient is ALLERGIC to BACTRIM which causes a rash.   FAMILY HISTORY:  Patient has multiple family members with coronary  artery disease and an aunt with diabetes.  The patient has mother and  uncle, both had suffered from a stroke.  No family history of cancer.  [name] mother died of heart failure.   SOCIAL HISTORY:  The patient lives alone in an apartment in [place].  She is on disability due to COPD.  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  She was divorced in 59.  She has a 20-pack-year of smoking but has been quit for over 1 year now.  The patient denies any alcohol or drug use.   PHYSICAL EXAM:  VITAL SIGNS:  Temperature 98.3, heart rate 105,  respiratory rate 20, blood pressure 111/75, CVG 185 and oxygen  saturation 97% on 4 L  nasal cannula.  GENERAL APPEARANCE:  The patient is awake, alert, in no acute distress,  she is oriented x3.  HEENT:  Head is normocephalic, atraumatic with pupils equal, round, and  reactive to light and accommodation.  Conjunctivae and lids are clear.  Nose is without current bleeding.  LUNGS:  Diffuse and expiratory wheezing but with adequate air movement.  HEART:  Regular rate and rhythm with no murmurs, rubs, or gallops and 2+  dorsalis pedis pulses bilaterally.  ABDOMEN:  Normoactive bowel sounds, soft, nontender, nondistended.  EXTREMITIES:  No clubbing, cyanosis, or edema but do have bilateral  lower extremity diffuse petechiae.  NEUROLOGICAL:  Cranial nerves II through XII are grossly intact.  The  patient is moving all 4 extremities and has normal strength.  SKIN:  The patient has petechiae bilaterally on the lower extremity  shins and multiple bruises on her arms bilaterally as well.   LABORATORY DATA FROM [facility] ED:  CBC shows a white count of 5.9,  hemoglobin of 8.8, hematocrit of 26.2, and platelets of 237.  The  patient's hemoglobin on December 02, 2006 was 9.9.  Her INR was  subtherapeutic at this time.  Basic metabolic panel at [facility] ED  showed a sodium of 136, potassium 4.7, chloride of 96, bicarb of 31, BUN  of 12, creatinine of 0.55, and glucose at 90.  PT was greater than 90.0,  and INR was greater than 10.2.   ASSESSMENT AND PLAN:  This is a 70 year old white female with multiple  medical problems, including bipolar disorder, type 2 diabetes, chronic  obstructive pulmonary disease, and hypertension who has a history of  noncompliance and presents today with epistaxis and supratherapeutic  INR.  Problem:  1. Supratherapeutic INR:  The patient is status post vitamin K 10 mg      intravenous x1 plus 1 unit fresh frozen plasma at [facility]      Emergency Department.  We will recheck CBC and coags now.     Epistaxis is currently stopped.  We will  monitor closely with q.4h.      vitals and for signs of anaphylaxis status post intravenous vitamin      K.  We will continue holding Coumadin and aspirin.  We will repeat      CBC and coags in the a.m. and consider a second dose of vitamin K.  We will hemoccult stools.  We will give Protonix p.o. b.i.d.  2. Type 2 diabetes:  Creatinine is normal, thus we will continue home      medications of Actos, Amaryl, and Glucophage with Accu-Cheks q.      a.c. and q.h.s.  We will place on ADA diet.  We will hold sliding      scale insulin coverage for now and to assess control with home      regimen.  We will check hemoglobin Alc in a.m.  3. Psychiatric:  We will continue the patient's multiple home      psychiatric medications at home doses, including Effexor, Klonopin,      trazodone, Topamax, Neurontin, and Haldol.  4. Hypertension.  We will continue the patient's home dose of      Enalapril and Lasix.  5. Chronic obstructive pulmonary disease:  We will continue the      patient's home dose of Advair and albuterol and give humidified      oxygen.  6. Constipation:  We will given MiraLax 17 g p.o. x1 now and then      given Senokot-S b.i.d. for loose stools.  7. FEN/GI:  Will KVO (keep vein open) intravenous fluids and place on      ADA diet.  Electrolytes are currently cycled and we will recheck      BMP in a.m.  8. Hyperlipidemia:  We will continue the patient's home dose of      Vytorin 10/40 mg p.o. daily.  9. Disposition:  Pending improved INR with no bleeding.  Social Work      will be consulted in the a.m. for transportation issues.     ______________________________  Drue Dun, M.D.    ______________________________  [name], M.D.    EE/MEDQ  D:  01/12/2007  T:  01/13/2007  Job:  [id number]

## 2011-05-07 NOTE — Discharge Summary (Signed)
[name], BHANDARI                ACCOUNT NO.:  [id number]   MEDICAL RECORD NO.:  [id number]          PATIENT TYPE:  INP   LOCATION:  3003                         FACILITY:  [facility]   PHYSICIAN:  [name], M.D.DATE OF BIRTH:  [birth year]   DATE OF ADMISSION:  12/15/2004  DATE OF DISCHARGE:  12/18/2004                                 DISCHARGE SUMMARY   ATTENDING PHYSICIAN:  [name], M.D.   DISCHARGE DIAGNOSES:  1.  Chronic obstructive pulmonary disease exacerbation.  2.  Diabetes mellitus type 2.  3.  Hyperlipidemia.  4.  Major depression, recurrent, anxiety.  5.  Hypertension.  6.  Obesity.  7.  Anticoagulation for pulmonary embolism diagnosed in November 2005.   DISCHARGE MEDICATIONS:  1.  Effexor 300 mg p.o. q.h.s.  2.  Neurontin 1200 mg p.o. q.h.s.  3.  Seroquel 300 mg p.o. q.h.s.  4.  Actos 30 mg p.o. daily.  5.  Altace 10 mg p.o. daily.  6.  Amaryl 8 mg p.o. q.a.m.  7.  Coumadin 7.5 mg daily.  8.  Metformin 1000 mg p.o. b.i.d.  9.  Lasix 40 mg p.o. daily.  10. Nexium 40 mg p.o. daily.  11. Singulair 10 mg p.o. daily.  12. Lipitor 40 mg p.o. daily.  13. Klonopin 10 mg q.a.m. with 1 mg at lunch and 1 mg at dinner.  14. Avalox 400 mg p.o. daily x4 days.  15. Xopenex 1.25 mg q.i.d.  16. Atrovent nebulizer 500 mcg q.i.d.  17. Tylenol 650 mg q.4-6h. p.r.n. pain.   DISCHARGE INSTRUCTIONS:  Diet:  Low carbohydrate.  Follow-up:  The patient  will follow-up at [facility] with primary care physician Dr.  Alvira [name] on January 12 at 2 p.m.  The patient will resume her INR  check schedule at [facility], first appointment being  Tuesday, January 2 for INR check.   HOSPITAL COURSE:  [name] is a 70 year old patient, well-known to [facility], reportedly with acute mental status changes for 2-  3 days.  The patient was incontinent of urine, per daughter, patient did not  answer phone the  day of admission so daughter called 911.  The patient was  reportedly very lethargic on presentation to [facility] ED and would not  stay awake. The patient reported feeling tired in the afternoon on the day  prior to admission, had visited the emergency department on December 18 and  December 24, and received Ativan, morphine, Phenergan both times in the ED  and sent home with Ativan and Tylox on December 18 and Ativan and Percocet  on December 24, on top of a long list of medications which are listed above.   ADMISSION LABORATORY DATA:  White count 17.2, hemoglobin 10.5, hematocrit  32, platelets 244, INR 2.9.  ETOH screen less than 5. Sodium 131, potassium  4.3, chloride 98, bicarb 28, BUN 15, creatinine 0.5, glucose 239.  Urinalysis:  Glucose greater than 1000, large blood, positive nitrate, red  blood cells 3-6, white blood cells 0-2, many bacteria.  Urine  drug screen  positive for benzodiazepines and opiates. Head CT:  No acute events. Chest x-  ray:  No acute changes, mild chronic bronchitic changes noted.   HOSPITAL COURSE:  PROBLEM NO. 1. ACUTE MENTAL STATUS CHANGES:  Given the  abundance of medications the patient had been given in the past week, in  addition to her home medication regimen, unintentional narcotics overdose is  the most likely explanation for patient's decreased level of consciousness.  The patient came back to baseline very quickly after narcotics were held.  We continued patient's home regimen as prescribed.  Patient was at baseline  at discharge.   PROBLEM NO. 2. CHRONIC OBSTRUCTIVE PULMONARY DISEASE EXACERBATION:  Unfortunately, the patient was requiring more oxygen per nasal cannula. At  the beginning of her admission, she was put on 3 L which is above her home  regimen of 2 L.  Repeat chest x-ray showed increased interstitial pulmonary  edema superimposed on interstitial bronchitic changes.  Due to patient's  elevated white count and low grade fever of  100.2, we opted to start patient  on antibiotics for community acquired pneumonia and possible aspiration when  unresponsive.  The patient received total of 3 days of antibiotics while in-  hospital. She will continue antibiotics therapy as an outpatient for 4 more  days.  The patient was on O2 requiring baseline of 2 L at discharge. Also  throughout admission, we continued the patient's albuterol and Atrovent  nebulizer treatments q.4-6h. and q.2h.  She will be discharged on these home  medications.   PROBLEM NO. 3. PULMONARY EMBOLISM:  Patient diagnosed with PE approximately  1 month ago, was started on Coumadin therapy.  The patient was maintained on  Coumadin, INR was followed daily.  The patient was discharged with an INR of  2.0 and will follow-up at [facility] on Monday,  January 2 for INR check.   PROBLEM NO. 4. ANXIETY:  Patient was maintained on all home medications  except Trazodone.  The patient remained stable with few bouts of emotional  agitation.  She will be discharged on her home medication regimen.   DISPOSITION:  The patient was discharged to home with resumption of elder  care assistance. Social worker was informed of discharge.  The patient may  have to be transported from hospital to home by transportation services due  to daughter's work schedule, daughter was called at place of employment, was  notified of patient's status by request of patient.  Patient and daughter  both voiced understanding to plan of care.       VRE/MEDQ  D:  12/18/2004  T:  12/18/2004  Job:  [id number]   cc:   [name], M.D.  [facility].  Family Prac. Resident  [place]  [place] [address]  Fax: [phone]

## 2011-05-07 NOTE — Discharge Summary (Signed)
[name], [name]                          ACCOUNT NO.:  [id number]   MEDICAL RECORD NO.:  [id number]                   PATIENT TYPE:  INP   LOCATION:  5743                                 FACILITY:  [facility]   PHYSICIAN:  Maryelizabeth Rowan, M.D.               DATE OF BIRTH:  1941/10/07   DATE OF ADMISSION:  05/09/2003  DATE OF DISCHARGE:  05/11/2003                                 DISCHARGE SUMMARY   ADMISSION DIAGNOSIS:  Chronic pulmonary obstructive disease exacerbation.   DISCHARGE DIAGNOSIS:  Chronic pulmonary obstructive disease exacerbation.   HOSPITAL COURSE:  This 70 year old patient of Dr. Abran Duke was admitted to  [facility] with COPD exacerbation.  She responded very well to  albuterol and Atrovent nebulizations as well as continuation of her  Pulmicort inhaler.  She did have O2 supplementation initially but on day two  of hospitalization had no need for oxygen supplement.  She did receive a  short course of antibiotics while in the hospital but never developed a  fever or productive cough.   DISCHARGE CONDITION:  The patient was discharged home in stable condition.  She was ambulating around the hospital prior to discharge and O2 saturations  were 90% prior to ambulation and 88 to 89% after ambulation.   DISCHARGE MEDICATIONS:  1. Seroquel 300 mg q.h.s.  2. Singulair 10 daily.  3. Trazodone 200 mg q.h.s.  4. Flonase two sprays daily.  5. Albuterol and Atrovent nebulizers.  6. Pulmicort inhaler two puffs b.i.d.  7. Coumadin 10 mg daily.  8. Altace 10 mg daily.  9. Amaryl 4 mg daily.  10.      Effexor 300 mg daily.  11.      Clonopin 2 mg q.i.d.  12.      Lipitor 40 mg q.h.s.  13.      Neurontin 400 mg t.i.d.  14.      Nexium 40 mg daily.  15.      Aspirin 81 mg daily.  16.      Lasix 40 mg b.i.d.   OF NOTE:  1. She has been changed to nebulizers from MDI.  2. The patient has a history of deep venous thromboses, she is unclear, but     her last  DVT was approximately a year ago.  She was sub-therapeutic on     admission and received a __________ load with an extra 5 mg of Coumadin     the day of discharge.  This needs to be followed up with an INR check and     also reevaluate for the length of therapy of Coumadin.    FOLLOW UP:  She is to follow up at [facility] in the lab for an INR  check on Monday, and with her primary M.D. next week.  Maryelizabeth Rowan, M.D.    ED/MEDQ  D:  05/11/2003  T:  05/11/2003  Job:  [id number]

## 2011-05-07 NOTE — Consult Note (Signed)
[name], [name]                ACCOUNT NO.:  [id number]   MEDICAL RECORD NO.:  [id number]          PATIENT TYPE:  INP   LOCATION:  4735                         FACILITY:  [facility]   PHYSICIAN:  Stefani Dama, M.D.  DATE OF BIRTH:  1941-06-27   DATE OF CONSULTATION:  DATE OF DISCHARGE:                                 CONSULTATION   REASON FOR REQUEST:  Right C6 fracture.   HISTORY OF PRESENT ILLNESS:  The patient is a 70 year old white female  who apparently had fallen down the stairs early this morning.  The  patient has a history of anxiety and bipolar disorder.  Apparently, has  had frequent visits to the emergency room in the recent past.  A CT scan  of the neck was performed on admission which demonstrates a right C6  facette laminar complex fracture.  There is a slight rotation of the C6  vertebrae on the C7 vertebra towards the left side.  There is slight  anterolisthesis.  The foramen appear widely patent.  There is a fracture  of the inferior margin of the body of C6.  [name] alignment by CT  scan, otherwise, is within the limits of normal.  A CT scan of the head  was also performed which demonstrates normal intracranial contents.  Clinically, the patient complains of neck pain.  She complains of pain  all over and loudly is requesting medications for pain, but otherwise,  appears somnolent when left alone.  Upon examination of her motor  strength, I note that she moves the deltoids with 4/5 strength.  Biceps  strength is 5/5 symmetric bilaterally, triceps strength is 5/5, grip  strength is 4/5, intrinsic strength is 5/5.  Sensation appears intact to  pin in both upper extremities, fairly consistently.  She does move both  lower extremities well.  Deep tendon reflexes were difficult to asses as  the patient has both arms wrapped with gauze.  She has marked  ecchymoses, more so over the left arm than the right arm.   IMPRESSION:  The patient has a right C6 complex  fracture through the  facette joint and into the vertebral body.  At this point, the bony  foramen appear to be widely patent and the alignment appears stable  enough that she may be immobilized in a hard cervical collar.  We will  follow the patient with plain x-rays.  Of concern is the patient's  ability be compliant with treatment of hard cervical collar.  At this  point, the other medical issues, namely her anxiety and bipolar disorder  will take precedence in her management.  We will follow her along  clinically, and only if there is evidence for instability of if the  patient is extremely noncompliant with conservative management, we will  consider surgical intervention.  The other concern that I have is the  patient's ability to make a rational decision regarding her treatment.  At this time, she is predominately concerned with pain.  It is my  understanding that she has been living independently up to this point.  However, the  history of bipolar disorder and issues of anxiety would  lead me to be concerned about the patient's capacity for rational  decision-making on her own behalf.      Stefani Dama, M.D.  Electronically Signed     HJE/MEDQ  D:  02/20/2007  T:  02/20/2007  Job:  [id number]

## 2011-05-07 NOTE — Discharge Summary (Signed)
[name], [name]                ACCOUNT NO.:  [id number]   MEDICAL RECORD NO.:  [id number]          PATIENT TYPE:  INP   LOCATION:  5708                         FACILITY:  [facility]   PHYSICIAN:  Johney Maine, M.D.   DATE OF BIRTH:  [birth year]   DATE OF ADMISSION:  08/10/2006  DATE OF DISCHARGE:                                 DISCHARGE SUMMARY   ADMITTING DIAGNOSES:  1. Lethargy.  2. Pain/weakness.  3. Diabetes type 2.  4. Bipolar disorder.  5. Chronic anticoagulation.  6. Hypertension.  7. Chronic obstructive pulmonary disease.   DISCHARGE DIAGNOSES:  1. Mood disorder,.  2. Diabetes type 2.  3. Chronic obstructive pulmonary disease.  4. History of deep vein thrombosis/anticoagulation.  5. Awaiting nursing home placement.   [name] is a 70 year old female who was admitted after almost daily  visits to the emergency room with complaints of shortness of breath and  falls.  She also on admission appeared to be lethargic __________  admitted  and had no __________  this hospital course.  Her initial __________    Dictation Ended At This Point           ______________________________  Johney Maine, M.D.     JT/MEDQ  D:  08/16/2006  T:  08/16/2006  Job:  [id number]

## 2011-05-07 NOTE — H&P (Signed)
[name], [name]                          ACCOUNT NO.:  [id number]   MEDICAL RECORD NO.:  [id number]                   PATIENT TYPE:  EMS   LOCATION:  MAJO                                 FACILITY:  [facility]   PHYSICIAN:  Silas Sacramento, M.D.                   DATE OF BIRTH:  02-16-1941   DATE OF ADMISSION:  08/12/2003  DATE OF DISCHARGE:                                HISTORY & PHYSICAL   SERVICE:  Conservation officer, historic buildings.   ATTENDING PHYSICIAN:  [name], M.D.   PRIMARY CARE PHYSICIAN:  Viviann Spare A. Waynette Buttery, M.D.   CHIEF COMPLAINT:  Shortness of breath.   HISTORY OF PRESENT ILLNESS:  The patient is a 70 year old white female with  history of COPD, anxiety disorder, depression and multiple ER visits who  presented by EMS for shortness of breath this morning. She states she has  been feeling sad for the past couple of days from lack of family contact and  things in general.  She called EMS this morning and she was slightly more  short of breath.  She had taken one albuterol treatment at home with minimal  relief of her shortness of breath by EMS report.  EMS found her to have O2  saturation of 90% on room air on presentation.  In the ER, her main  complaint was being anxious and still some shortness of breath; however, she  was seen after initial Xopenex nebulizer treatment x1 and an albuterol  continuous 10 mg treatment.  Her initial O2 saturation on presentation was  recorded as 99% on 15 liters of O2, then followed by 93% on two liters O2  nasal cannula.  She was initially given the continuous albuterol nebulizer  treatment for having continued wheezing.  The patient is not on home oxygen.  When seen in the ER, her O2 sat did drop to 85 to 86% on room air.  This was  when she was awake and comfortable.   REVIEW OF SYSTEMS:  Positive for fever and chills this morning only.  No  previous fever or chills.  She states she feels nervous all the time.  CARDIOVASCULAR:   No chest pain.  GI:  No nausea or vomiting, no diarrhea.  RESPIRATORY:  Positive for wheezing, some increased shortness of breath and  positive for cough but this is not productive.  SKIN:  No rash.  PSYCHIATRY:  Decreased anxiety.  Very sad and anxious about being alone and complains of  everything not going right.  She states that her children do not talk to her  anymore and she does not have anyone to call and feels very lonely and  without a home.  HEMATOLOGIC:  Some bruising of the left arm, no other  bruising.  GU:  Normal urine output.  No dysuria.   PAST MEDICAL HISTORY:  1. Diabetes mellitus type 2.  2.  Hyperlipidemia.  3. Major depression.  4. Anxiety disorder, not otherwise specified.  5. Hypertension.  6. COPD.  7. Osteopenia.  8. Obesity.  9. Smoking, tobacco abuse.  10.      DVT greater than three months ago.  Of note,  with her COPD she has     had some underlying wheezing in the past.  Her O2 saturation last     hospitalization ranged from 88% after ambulation to 90% at rest.  This     was when she was discharged home but her oxygen saturation apparently has     been in the 90s in office visits and previous ER visits.  Also of note,     she has had greater than three months of therapy for this and DVT and     planned on discontinuing Coumadin as an outpatient but the patient was     very reluctant to do so.  11.      Cholecystectomy in 1981.  12.      Hysterectomy for cervical cancer in 1969.  13.      History of ephedrine abuse.  14.      History of blood clots.  15.      History of superficial venous thrombosis in October 2003.   MEDICATIONS:  1. Xopenex 1.25 mg nebulizer q.i.d.  2. Altace 10 mg p.o. daily.  3. Amaryl 4 mg p.o. daily.  4. Aspirin daily but the patient is not taking currently.  5. Atrovent nebulizer 500 mcg q.i.d.  6. Coumadin as directed as an outpatient.  Last INR was apparently 1.6 but     this is on medication that was suppose to be  weaned off as an outpatient.  7. Effexor 300 mg p.o. q.a.m.  8. Klonopin 1 mg q.i.d.  9. Lipitor 40 mg daily.  10.      Neurontin 400 mg t.i.d. and 1200 mg q.h.s.  11.      Nexium 40 mg daily.  12.      Pulmicort Respules recorded as 2 mg b.i.d.  This is most likely 2     mL b.i.d.  13.      Seroquel 50 mg b.i.d. and 200 mg q.h.s.  14.      Singulair 10 mg daily.  15.      Trazodone 200 mg p.o. q.h.s.   ALLERGIES:  BACTRIM DS causes rash.   FAMILY HISTORY:  Positive for CAD and diabetes mellitus but no further  clarification on dates and ages.   SOCIAL HISTORY:  The patient is on disability due to COPD.  She lives in an  apartment in [place], [place], by herself.  She is divorced.  She  has greater than 20-pack-year history now to approximately half to one pack  per day of cigarettes.  No alcohol.  No drugs.  She has apparently a sister  in [place], [place], and two daughters in [place], with minimal contact.   PHYSICAL EXAMINATION:  GENERAL APPEARANCE:  Anxious, alert and oriented x3,  speaking in near full sentences.  VITAL SIGNS:  Temperature 99.1, blood pressure 126/76, pulse 127 to 136,  respiratory rate 34 down to 20s, 93% on 15 liters O2 down to 85 to 86% on  room air.  This had increased to approximately 92 to 94% on two liters O2  nasal cannula.  HEENT:  Normocephalic, atraumatic.  PERRL, EOMI.  TMs pearly gray  bilaterally.  O2 nasal cannula in place.  She is edentulous.  Oropharynx  shows slightly dry oral mucosa.  NECK:  No apparent thyromegaly or lymphadenopathy.  RESPIRATORY:  She had wheezing bilaterally with increased expiratory and  inspiratory ratio, increased AP diameter and some decreased breath sounds  throughout.  Respiratory effort was fair and minimal distress.  CARDIOVASCULAR:  Tachycardic with regular rhythm.  Palpitation showed no heave or lift.  There was no edema or cyanosis on extremities.  MUSCULOSKELETAL:   Normal muscle tone.  GI:  Normal active bowel sounds, nontender and nondistended, slightly obese,  no apparent hepatosplenomegaly.  SKIN:  Warm, no apparent rash, some bruising on left arm.  NEUROLOGIC:  Cranial nerves II-XII grossly intact.  Nonfocal examination.  RECTAL:  Deferred.  PSYCHIATRIC:  Fair judgment and insight.  She is anxious and tearful, no  suicidal or homicidal ideations.  No auditory, visual or tactile  hallucinations or delusions.   LABORATORY DATA:  Portable chest x-ray showed interstitial changes but no  acute disease.   ASSESSMENT AND PLAN:  A 70 year old white female with history of chronic  obstructive pulmonary disease, anxiety disorder, increased shortness of  breath.   1. Shortness of breath, likely chronic obstructive pulmonary disease     exacerbation, usually not oxygen requiring at home, although has required     some oxygen on hospitalizations prior.  Oxygen saturation decreased to 85     to 86% on room air here in the ER and this was when she was at rest.  She     has had some increased cough, shortness of breath and wheezing but cough     has been nonproductive so doubt antibiotic is needed at this point in     time.  Will continue nebulizer treatments in the form Atrovent and     Xopenex.  May need to change to albuterol if requires more frequency.     She received Xopenex and continue albuterol x1 in the emergency room.     Start Solu-Medrol 125 mg IM, then prednisone 60 mg p.o. daily for her     chronic obstructive pulmonary disease exacerbation.  Will continue her on     oxygen per nasal cannula at two liters as she is holding oxygen     saturations currently on this dose of oxygen.  Will attempt to wean this     off during hospitalization.  2. Anxiety disorder with concomitant history of depression.  She is very     tearful and __________ in the emergency room.  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     Therefore, will continue her Klonopin and other anxiolytics and     psychotropic medications at this point in time.  There was 1 mg of     Klonopin given in the ER with some improvement in her baseline mental     status.  She does have some anxiety due to living alone and distress over     lack of family contact and family support.  May need to discuss placement     options in the near future if this continues to be a significant stressor     that causes hospitalization as she has had 25 previous emergency room     visits listed on the computer. Will continue her Klonopin 1 mg q.i.d. and     continue her Seroquel, Effexor, and other psychotropics at previous     doses.  3.  Tobacco abuse or dependence.  She continues to smoke half to one pack per     day and wishes to have a nicotine patch while here as it helps her nerves    as she cannot smoke.  Will start nicotine patch during hospitalization.  4. History of deep venous thrombosis, distant by history.  Discussed with     primary M.D., states they are in the process of weaning the patient off     Coumadin, however, she is very reluctant to come off Coumadin.  Will hold     on Coumadin for now and let primary M.D. and team discuss with the     patient that no Coumadin is needed for distant deep venous thrombosis.     She has multiple bruises on the arm that were pointed out to her  as     possible reasons to discontinue her Coumadin.  5. Diabetes mellitus, type 2.  No known home CBGs.  Will check hemoglobin     A1C, check CBGs q.h.s. and q.a.c.  while in hospital secondary to her     being on prednisone as this may increase her glucose.  She may need     sliding scale insulin.  Will continue her Amaryl at 4 mg daily. Will     check repeat basic metabolic panel to evaluate     current glucose as well as to evaluate her potassium as she has had     continuous albuterol treatment here and will have frequent nebulizer      treatments.  6. Code status.  The patient wishes to be do not intubate, but otherwise     full code.                                                Silas Sacramento, M.D.    Jearld Pies  D:  08/12/2003  T:  08/12/2003  Job:  [id number]   cc:   William A. Hensel, M.D.  [address] [place]  [place] [address]  Fax: [phone]   Maylon Peppers. Waynette Buttery, M.D.  Fam. Med - Resident - [place], [place] [address]  Fax: [phone]

## 2011-05-07 NOTE — Discharge Summary (Signed)
[name], [name]                          ACCOUNT NO.:  [id number]   MEDICAL RECORD NO.:  [id number]                   PATIENT TYPE:  INP   LOCATION:  5733                                 FACILITY:  [facility]   PHYSICIAN:  [name], M.D.             DATE OF BIRTH:  [birth year]   DATE OF ADMISSION:  08/12/2003  DATE OF DISCHARGE:  08/14/2003                                 DISCHARGE SUMMARY   DISCHARGE DIAGNOSES:  1. Chronic obstructive pulmonary disease.  2. Anxiety.  3. Hypertension.  4. Hyperlipidemia.  5. Diabetes mellitus, steroid induced.  6. Depression.  7. History of deep vein thrombosis.   PROCEDURES:  Chest x-ray performed August 14, 2003 (PA and lateral).  Results: COPD with no evidence of active disease or significant change.   CONSULTATIONS:  None.   HISTORY OF PRESENT ILLNESS:  The patient is a 70 year old white female with  a very significant history of COPD and anxiety and multiple ER visits who  presented by EMS with shortness of breath and feeling sad for the last 2 or  3 days.  She was found to have an O2 saturation of approximately 90% on room  air.  She was given Xopenex and albuterol in the ER as well as placed on 15  L nasal cannula which brought O2 saturations to 99% however, despite  continuous nebulizer treatment she continued to have wheezing and  desaturations on room air.   PHYSICAL EXAMINATION:  VITAL SIGNS:  Stable with the exception of oxygen  saturation 85-86% on room air.  GENERAL:  Very anxious, appears older than stated age white female in no  acute distress.  RESPIRATORY:  Bilateral wheezing with increased AP diameter and decreased  breath sounds throughout but adequate effort and minimal distress.  CARDIOVASCULAR:  Tachycardic but a regular rhythm.  REMAINDER OF PHYSICAL EXAM:  Unremarkable.   Please see the admission H&P for full admission details.   HOSPITAL COURSE:  Diagnosis 1. CHRONIC OBSTRUCTIVE PULMONARY DISEASE.   As  stated earlier the patient had been placed on Xopenex and albuterol  continuous nebulizer treatment with minimal improvement.  The patient was  given Xopenex 1.25 mg nebulizer q.i.d. as well as Atrovent 500 mcg q.i.d.,  Solu-Medrol 125 mg IV x1 then followed by prednisone 60 mg p.o. once daily,  Pulmicort Respules 25 mg nebulized b.i.d. as well as Singulair 10 mg p.o. at  bedtime.  In addition, the patient was placed on all of her regular  outpatient medications for her other comorbidities.  Following regular  treatment with the nebulizers and inhalers the patient's respiratory status  was stable the following day however, given the patient's anxiety about her  condition the patient was maintained in the hospital, the Xopenex was  changed to albuterol 90 mcg MDI.  In addition, the patient was also placed  on an insulin sliding scale by Dr. Brantley [name], M.D. secondary  to  steroid-induced diabetes.  The patient was taken off of oxygen which she  tolerated without complication however, overnight the patient was noted to  choke on an ice chip at which time she became very anxious and she was  restarted on the oxygen however, the oxygen at 2 L nasal cannula had no  effect on her saturations which were in the mid 90s at time of discharge.   Diagnosis 2. ANXIETY.  The patient was maintained on her Klonopin q.i.d.,  this was changed to q.6h. as the patient was requesting her anxiety medicine  on a regular schedule.   DISCHARGE MEDICATIONS:  1. Albuterol 1.25 mg nebulizer q.4-6h. as needed for wheezing.  2. Pulmicort Respules 2 mg inhaler b.i.d.  3. Atrovent 18 mcg MDI two to three puffs three times a day.  4. Singulair 10 mg p.o. at bedtime.  5. Prednisone 60 mg p.o. once daily x2 additional days for a 5-day course of     treatment.  6. Altace 10 mg p.o. once daily.  7. Amaryl 4 mg with first meal of day.  8. Aspirin 81 mg p.o. once daily.  9. Neurontin 1200 mg at night and 400 mg  with meals.  10.      Trazodone 200 mg at night.  11.      Effexor 300 mg at night.  12.      Klonopin 1 mg every 6 hours.  13.      Seroquel 200 mg at night.  14.      Zocor 80 mg at 6 p.m.  15.      Nexium 40 mg p.o. once daily.   DIET:  The patient was discharged on a low salt/cardiac/diabetic diet.   DISCHARGE DESTINATION:  The patient was discharged home.   DISCHARGE INSTRUCTIONS:  The patient was instructed to call the family  practice center if she had any additional problems or needed additional  refills.  She was also instructed to call the family practice center for a  followup appointment with her primary care physician.      Kristen Mimes, MD                         [name], M.D.    TV/MEDQ  D:  08/22/2003  T:  08/23/2003  Job:  [id number]   cc:   [name], M.D.  Fam. Med - Resident - [place], [place] [address]  Fax: [phone]

## 2011-05-07 NOTE — Discharge Summary (Signed)
[name], [name]                ACCOUNT NO.:  [id number]   MEDICAL RECORD NO.:  [id number]          PATIENT TYPE:  INP   LOCATION:  5742                         FACILITY:  [facility]   PHYSICIAN:  Wayne A. Sheffield Slider, M.D.    DATE OF BIRTH:  [birth year]   DATE OF ADMISSION:  11/11/2005  DATE OF DISCHARGE:                                 DISCHARGE SUMMARY   DISCHARGE DIAGNOSES:  1.  Constipation.  2.  Chronic obstructive pulmonary disease.  3.  Diabetes mellitus type 2.  4.  Hyperlipidemia.  5.  Anxiety disorder.  6.  History of pulmonary embolus/deep venous thrombosis.   DISCHARGE MEDICATIONS:  1.  Actos (45 mg) one tablet p.o. daily.  2.  Amaryl (4 mg) two tablets p.o. daily.  3.  Glucophage (1000) one tablet p.o. b.i.d.  4.  Altace (10 mg) one tablet p.o. daily.  5.  Coumadin 10 mg on Fridays, 12.5 mg the rest of the days of the week.  6.  Pulmicort Respules (0.5 mg/5 mL) one dose b.i.d.  7.  Xopenex (1.25 mg) nebulization q.i.d. with Atrovent (0.5 mg)      nebulization q.i.d.  8.  Neurontin (400 mg) one tablet p.o. t.i.d.  9.  Trazodone (100 mg) one tablet p.o. q.h.s.  10. Effexor (300 mg) one tablet p.o. q.a.m.  11. Topamax (200 mg) one tablet p.o. q.h.s.  12. Restoril (30 mg) one tablet p.o. q.h.s. p.r.n. insomnia.  13. Lasix (20 mg) one tablet p.o. daily.  14. Vytorin (10/40) one tablet p.o. daily.  15. Nexium (40 mg) one tablet p.o. daily.  16. Klonopin (1 mg) one tablet p.o. q.a.m. and one tablet p.o. q.h.s.  17. Doxycycline (100 mg) one tablet p.o. b.i.d. for 10 days.  18. Senokot-S two tablets p.o. daily for constipation.  19. Fleets enema x1 for constipation.   CONSULTS:  Dr. Jeanie Sewer (psychiatry).   FOLLOW-UP APPOINTMENTS:  1.  The patient is to call Dr. Gilmore Laroche at the [facility] for      a follow-up appointment within one to two weeks.  2.  The patient is to call [facility] for an appointment on      November 27 or November 28 for  follow-up INR.   BRIEF HISTORY, PHYSICAL EXAMINATION, AND LABORATORY DATA ON ADMISSION:  A 70-  year-old white female with history of diabetes mellitus type 2,  hypertension, COPD, anxiety disorder, complaining of constipation for  approximately 3-4 weeks. The patient has been seen at the [facility] and [place] and treated with Colace, Glycolax, and enema x1 at  [facility] with no success. The patient came to the ED complaining of  abdominal pain 10/10, diffuse, for 1 week. Also, nausea and vomiting (2-3  times a day) for the last 4 days with decreased p.o. intake. Urine output  was within normal limits. The patient denies fever, chest pain, headache.  The patient has a history of COPD with chronic shortness of breath secondary  to COPD. [name] baseline is 3 L of oxygen q.h.s. The patient stated  that  she was diagnosed at [facility] on November 20 with  bronchitis and placed on doxycycline (100 mg) one tablet p.o. b.i.d. for 10  days. The patient states that she did not start this medication until  November 22 secondary to being unable to afford it. The patient stated she  is not passing gas for one to two weeks. The patient during the encounter  seems very anxious. Personally, I feel that the patient is accentuating her  symptoms. Given the history of constipation for 3-4 weeks with now worsening  of abdominal pain and nausea/vomiting, the patient was admitted to rule out  obstruction and treat constipation.   Physical examination:  On day of admission, the patient's abdomen was soft,  diffusely tender, bowel sounds were positive. No rebound, no guarding. The  patient was afebrile. Vital signs:  Pulse 104, respirations 20, blood  pressure 114/72, O2 saturation 97% on room air. Laboratory data on  admission:  WBC 14, hemoglobin 10.1, hematocrit 31.5, platelets 322. Sodium  132, potassium 3.7, chloride 97, CO2 24, BUN 20, creatinine 1, glucose 210,   calcium 8.7. INR was 3.2. A KUB ruled out obstruction and diagnosed  constipation.   HOSPITAL COURSE:  #1 - CONSTIPATION. Most likely secondary to the patient  started on calcium. The patient was treated with enema and clear liquid  diet. By November 24 the patient was clinically stable, bowel movements x3,  remarkably improved abdominal pain, tolerating p.o. A new enema was repeated  during the a.m. and a KUB was reordered for follow-up. Since the patient was  clinically stable, the patient was discharged during the afternoon with  follow-up appointment with family physician within 1-2 weeks and p.o.  Senokot-S. Enemas p.r.n.   #2 - ABDOMINAL PAIN. The patient was afebrile, WBC within normal limits, no  toxic appearance, lipase and amylase were normal. Acute abdomen and bowel  obstruction were ruled out. Abdominal pain most likely secondary to  constipation.   #3 - CHRONIC OBSTRUCTIVE PULMONARY DISEASE. The patient was placed on  Pulmicort Respules and Atrovent nebulizations. Throughout this  hospitalization the patient was clinically stable. The patient was diagnosed  with acute bronchitis on November 20 at the [facility]. The  patient did not start antibiotics until November 22 secondary to not being  able to afford them. The patient is to complete a 10-day course of  doxycycline (100 mg) one tablet p.o. b.i.d. The patient is to call the  [facility] for a follow-up appointment with family physician  within 1-2 weeks.   #4 - DIABETES MELLITUS TYPE 2. Stable throughout hospitalization. The  patient was placed on home medications. Hemoglobin A1c 7.1 for this  admission. This issue to be followed by primary physician.   #5 - HYPERLIPIDEMIA. Vytorin (10/40) one tablet p.o. daily. She will follow  up with primary physician.   #6 - ANXIETY DISORDER. This patient was known by our service. This patient has frequently visited to the ER and [facility]  secondary to  issues mostly related to her anxiety. Dr. Jeanie Sewer was consulted. Per Dr.  Jeanie Sewer, this patient has an anxiety disorder and she needs to be ruled  out for bipolar disorder and maniac. Per psychiatry, Effexor (300 mg) one  tablet p.o. q.a.m., Topamax (200 mg) one tablet p.o. q.h.s., trazodone (100  mg) one tablet p.o. q.h.s., and Restoril (200 mg) one tablet p.o. q.h.s.  p.r.n. insomnia were started. Per Dr. Jeanie Sewer, the sooner this medication  is started  the better control of this patient's mood disorder. This issue to  be followed by primary physician and psychiatrist.   #7 - HISTORY OF PULMONARY EMBOLUS/DEEP VENOUS THROMBOSIS. Coumadin was held  on day of admission secondary to the patient being n.p.o. secondary to the  patient being ruled out for bowel obstruction.  Coumadin was resumed on November 24. The patient received 10 mg p.o. during  the morning. The patient is currently on 10 mg p.o. one day of the week of  Coumadin, and 12.5 mg the rest of the days of the week. INR on admission was  3.2. By November 24 INR was 1.9. The patient is to come for an INR check on  November 27.      Henri Medal, MD    ______________________________  Arnette Norris Sheffield Slider, M.D.    Lendon Colonel  D:  11/12/2005  T:  11/12/2005  Job:  [id number]   cc:   Broadus John T. Pamalee Leyden, MD  Fax: [phone]

## 2011-05-07 NOTE — H&P (Signed)
[name], [name]                          ACCOUNT NO.:  [id number]   MEDICAL RECORD NO.:  [id number]                   PATIENT TYPE:  INP   LOCATION:  0448                                 FACILITY:  [facility]   PHYSICIAN:  Leonia Reeves, MD                 DATE OF BIRTH:  February 25, 1941   DATE OF ADMISSION:  01/16/2004  DATE OF DISCHARGE:                                HISTORY & PHYSICAL   ADMISSION DIAGNOSES AND PLAN:  1. Acute exacerbation of chronic obstructive pulmonary disease.     a. Nebulizer treatment with Atrovent and albuterol.     b. Intravenous Solu-Medrol.     c. Combivent inhaler.     d. Oxygen to titrate to a saturation of 92% or more.  2. Severe dyspnea with hypoxia of acute onset, rule out pulmonary embolism.     a. Protocols for rule out pulmonary embolism, including anticoagulation        with Lovenox.     b. CT scan of chest to rule out pulmonary embolism.  3. Probable pneumonia.     a. Chest x-ray.     b. Intravenous antibiotics.     c. Sputum culture.  4. Chronic anxiety disorder and history of major depression.     a. Continue with the patient's medications for anxiety and depression.     b. Consider psychiatry consult.  5. History of other multiple medical problems.     a. [name] home medications to be added as needed.   CHIEF COMPLAINT:  Shortness of breath, cough.   HISTORY OF PRESENT ILLNESS:  Ms. Jahr is a 70 year old white female with a  history of multiple medical problems, including COPD, hypertension, diabetes  mellitus, major depression, anxiety disorder, and DVT, who is also known for  multiple ER visits who presents with shortness of breath of sudden onset.  The patient is a poor historian and she has had cough productive of scanty  yellowish sputum.  She denied fever, chills, nausea, vomiting, and abdominal  pain.  She also denies chest pain.  The patient has been tearful and  occasionally crying, feeling neglected at home.  Her  speech is pressured and  somewhat __________incoherent.  In the ER, she was noted to be desaturating  at 80% on room air which improved to 90% on 3 L after Atrovent/albuterol  nebulization treatment.  Of note, she was seen yesterday in the ED with  respiratory symptoms, a chest x-ray was done which showed no evidence of  acute cardiopulmonary disease, but showed chronic interstitial changes and  mild cardiomegaly.  She was discharged, but her symptoms continued and  became worse for which the patient came back to the ED today.   PAST MEDICAL HISTORY:  1. Diabetes mellitus type 2.  2. Hyperlipidemia.  3. Major depression.  4. Anxiety disorder.  5. Tobacco abuse and tobacco dependence.  6. History of DVT.  7. COPD.  8. Hypertension.  9. History of frequent ER visits.   SOCIAL HISTORY:  Positive for tobacco abuse, denied alcohol or any other  recreational drug abuse.   ALLERGIES:  BACTRIM.   CURRENT MEDICATIONS:  (The patient could not list her medications, but from  previous admissions, the following are listed).  1. Xopenex 1.25 mg q.i.d.  2. Altace 10 mg daily.  3. Amaryl 4 mg once daily.  4. Aspirin 81 mg once daily.  5. Atrovent nebulizer four times as needed.  6. Effexor 300 mg once at night.  7. Klonopin 2 mg four times daily.  8. Lipitor 40 mg once daily.  9. Neurontin 400 mg three times daily.  10.      Nexium 40 mg once daily.  11.      Pulmicort 2 mg twice daily.  12.      Seroquel 50 mg twice daily and 200 mg at night.  13.      Singular 10 mg once daily.  14.      Trazodone 200 mg once at night.   REVIEW OF SYSTEMS:  CONSTITUTIONAL:  No fever.  CARDIOPULMONARY:  Shortness  of breath, cough with scanty sputum production.  No chest pain.  GASTROINTESTINAL:  No nausea, no vomiting, no abdominal pain.  EXTREMITIES:  Mild leg edema.   PHYSICAL EXAMINATION:  GENERAL:  A 70 year old white female in mild  respiratory distress.  VITAL SIGNS:  Temperature 98.3, heart  rate 109, respiratory rate 24, blood  pressure 157/91, oxygen saturation 93% on 3 L.  The patient is alert and  oriented x3.  HEENT:  Head is normocephalic, atraumatic.  Pupils equal, round, reactive to  light and accommodation.  Sclerae is anicteric.  Mucous membranes are moist.  No evidence of oropharyngeal lesions.  NECK:  Supple without adenopathy.  No jugular venous distention, no carotid  bruits.  CARDIOVASCULAR:  Tachycardia with heart rate of 109, normal S1 and S2, no S3  gallop, no rub appreciated.  RESPIRATORY:  Lungs significant bilateral wheezes, no obvious rhonchi  appreciated.  GASTROINTESTINAL:  Abdomen is soft, nontender, no palpable mass.  Bowel  sounds are normal.  EXTREMITIES:  1+ bilateral pedal edema, no calf tenderness, no cyanosis, no  clubbing.  NEUROLOGIC:  No focal neurological deficits.  Mood and affect flat.  The  patient appears depressed.  Cranial nerves II-XII are grossly intact.   LABORATORY DATA:  Chest x-ray done on January 15, 2004, no evidence of  active cardiopulmonary disease, chronic interstitial changes and mild  cardiomegaly.  Sodium 134, potassium 4.1, chloride 100, CO2 32, BUN 6,  creatinine 0.6, glucose 271, calcium 9.5.  White count 6.7, hemoglobin 10.5,  hematocrit __________, platelets 254.  Troponin less than 0.01, CK-MB 2.  EKG pending.   ASSESSMENT:  1. This is a 70 year old white female with a history of multiple medical     problems, including chronic obstructive pulmonary disease and a deep vein     thrombosis, who presents with severe shortness of breath of sudden onset,     lung examination significant bilateral wheezing noted.  Chest x-ray no     evidence of acute cardiopulmonary disease, but there are chronic     interstitial changes with mild cardiomegaly, the first set of cardiac     enzymes are unremarkable.  The patient will be admitted to the medical     floor and managed as stated above. 2. In this patient with previous  history of deep  vein thrombosis who     presents with sudden onset dyspnea and desaturation in the 80s, a     question of pulmonary embolism is being raised.  Although the patient has     anxiety disorder which may also show similar features, severe     desaturation on room air is a concern.  3. A CT scan of the chest to rule out pulmonary embolism is appropriate, and     the patient will be started on anticoagulation with Lovenox until     pulmonary embolism is ruled out.  4. For acute exacerbation of chronic obstructive pulmonary disease, the     patient has been started on intravenous Solu-Medrol, nebulizer     treatments, and also oxygen treatment.  She will benefit from intravenous     antibiotics as a part of regimen for management of chronic obstructive     pulmonary disease, and also for questionable pneumonia.  5. The patient appears depressed and she has obvious symptoms of     exacerbation of anxiety, she would benefit from psychiatric consult.  6. For diabetes mellitus, the patient will be continued with her home     medications and her blood glucoses will be closely monitored.  She will     also benefit from regular insulin by sliding scale for accurate control.     Home medications will be added as needed.                                               Leonia Reeves, MD    VO/MEDQ  D:  01/16/2004  T:  01/16/2004  Job:  [id number]

## 2011-05-07 NOTE — H&P (Signed)
[place]  Patient:    [name], FOLSON Visit Number: [id number] MRN: [id number]          Service Type: MED Location: [id number] Attending Physician:  Sanjuana Letters Dictated by:   Lucille Passy, M.D. Admit Date:  01/07/2002   CC:         Andrey Spearman, M.D. at the [facility]   History and Physical  DATE OF BIRTH:  1941-07-04  SERVICE:  [facility].  ATTENDING:  [name], M.D.  PRIMARY DOCTOR:  Andrey Spearman, M.D. at the family practice center.  PSYCHIATRIST:  Masoud S. Wynonia Lawman, M.D.  CHIEF COMPLAINT:  Chest pain.  HISTORY OF THE PRESENT ILLNESS:  A 70 year old white female with a history of diabetes type 2, hypertension, high cholesterol, anxiety, COPD, presents to the ER with two to three weeks of "sharp" chest pain.  The pain is substernal and left-sided, radiates to the jaw and the back, associated with diaphoresis and nausea, not associated with exertion.  Relieved by nitroglycerin per EMS. Episodes last approximately 10 minutes.  She also reports two weeks of nausea and vomiting up to five episodes a day which consist of the food in her stomach, not relived completely by Phenergan at home.  Patient reports breathing is at baseline.  Most notably, the patient has been taking up to 100 ephedrine tablets each day.  REVIEW OF SYSTEMS:  Positive for hot and cold flashes; numbness over the left side of the head; tremors; feeling very anxious, especially regarding her ill mother.  Denies dysuria, hematuria, urgency.  PAST MEDICAL HISTORY:  Diabetes type 2 with a hemoglobin A1c of 5.4 in February 2002.  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 Betti Cruz.  MEDICATIONS:  1. Mini Thin (ephedrine) 100 tablet p.o. q.d.  2.  Albuterol and Atrovent nebulizers q.i.d. p.r.n.  3. Altace 10 mg p.o. q.d.  4. Amaryl 4 mg p.o. q.d.  5. Enteric-coated aspirin 325 mg p.o. q.d.  6. Effexor 150 mg p.o. q.h.s.  7. K-Dur 20 mEq p.o. q.d.  8. Lasix 40 mg p.o. q.d.  9. Lipitor 10 mg p.o. q.d. 10. Singulair 10 mg p.o. q.d. 11. Trazodone 100 mg p.o. q.h.s. 12. Flovent four puffs q.a.m. and q.p.m. 13. Nexium 20 mg p.o. q.d. 14. Phenergan p.r.n. 15. Vistaril p.r.n.  ALLERGIES:  SULFA and BACTRIM.  FAMILY HISTORY:  Father had an MI at age 60.  Mother has CHF, CVA.  Aunt died of an MI at age 61.  Sister with hyperthyroidism.  SOCIAL HISTORY:  Patient is on disability for lung disease.  She stays home taking care of her sick mother.  Smokes four to five cigarettes a day.  Denies alcohol or drug abuse.  Divorced.  PHYSICAL EXAMINATION:  VITAL SIGNS:  Temperature 98.1, pulse 104, blood pressure 143/75, respirations 22, pulse oximetry 98% on room air.  HEENT:  Pupils are equal, round and reactive to light.  Slight left exotropia. Nares and oropharynx clear.  Few beats of nystagmus.  NECK:  No JVD, no lymphadenopathy, no thyromegaly.  CARDIOVASCULAR:  Regular rate and rhythm, tachycardic, no murmurs.  Peripheral pulses 2+.  LUNGS:  Diffuse expiratory wheezes throughout.  BACK:  No spine tenderness, question slight bilateral CVA tenderness.  ABDOMEN:  Positive bowel sounds, soft, nontender, nondistended, no masses.  EXTREMITIES:  Trace edema bilaterally.  Bilateral excoriations on shins.  GENITOURINARY:  Incontinent of urine.  LABORATORY DATA:  Reveals pH 7.411, PCO2 46.3, HCO3 30 on an i-STAT ABG. Sodium 139, potassium 3.8, CO2 31, chloride 105, BUN 13, creatinine 0.7, glucose 67.  Hemoglobin 14, hematocrit 41.  CK 162, MB 3.2, troponin 0.01.  Admission EKG shows a sinus tachycardia with a rate of 106, insignificant Q waves in leads II, III, and aVF.  Chest x-ray shows COPD with no infiltrate.  ASSESSMENT  AND PLAN:  A 70 year old Caucasian female with multiple medical problems and a history of stimulant and benzodiazepine abuse, now with chest pain.  1. Chest pain.  Differential diagnosis includes medication-induced anxiety,    cardiac, gastroesophageal reflux disease.  EKG and first set of enzymes    are unremarkable.  Will give GI cocktail, a.m. EKG, cycle enzymes, p.r.n.    nitroglycerin for further chest pain.  The patient is already on an aspirin    and an ACE inhibitor.  Question starting beta blocker in the setting of    COPD.  Cardiac risk is high probability with a score of 16.  Fasting lipid    panel and TSH.  Cardiac risk factors include age, high cholesterol,    hypertension, diabetes, family history. 2. Chronic obstructive pulmonary disease.  Continue albuterol and Atrovent,    Flovent, Singulair.  Oxygen to keep saturations in the low to mid 90s.    Prednisone p.o.  CBC.  Urge smoking cessation.  Consider antibiotics if    febrile or high white blood cell count.  Stable for now. 3. Nausea and vomiting.  Differential diagnosis includes viral    gastroenteritis, anxiety.  Patient already taking regular diet in the ER.    P.o. Phenergan p.r.n.  Check UA to urinary tract infection.  Bolus IV    fluids then Heplock. 4. Diabetes type 2.  Continue Amaryl, check CBGs, ADA diet. 5. Drug abuse.  The patient has already been given Valium in the ER so urine    toxicology screen will be less useful, but we will still obtain this.  We    will not give the patient any benzodiazepines and will stop her ephedrine.    Patient has known history of abuse with these drugs.  Consider psych    consult. Dictated by:   Lucille Passy, M.D. Attending Physician:  Sanjuana Letters DD:  01/08/02 TD:  01/08/02 Job: [id number]

## 2011-05-07 NOTE — Discharge Summary (Signed)
NAMEDEMA, TIMMONS                          ACCOUNT NO.:  [id number]   MEDICAL RECORD NO.:  [id number]                   PATIENT TYPE:  IPS   LOCATION:  0501                                 FACILITY:  [facility]   PHYSICIAN:  Jeanice Lim, M.D.              DATE OF BIRTH:  16-Sep-1941   DATE OF ADMISSION:  09/16/2003  DATE OF DISCHARGE:  09/24/2003                                 DISCHARGE SUMMARY   IDENTIFYING DATA:  This is a 70 year old Caucasian female, single,  voluntarily admitted requesting help getting off Klonopin and taking 93  tablets since 9/16 and 60 tablets of Vistaril.  The patient was depressed  since mother died; had COPD flare in Aug 17, 2023 and was on prednisone which  increased anxiety and started increasing Klonopin due to her depressed mood,  loneliness, helpless, hopeless and denied intentional suicidal ideation but  admits to being out of control.   DRUG ALLERGIES:  Bactrim.   MEDICATIONS:  1. Albuterol.  2. Pulmicort.  3. Xopenex nebulizer.  4. Atrovent nebulizer.  5. Singulair 10 mg q.a.m.  6. Amaryl 4 mg q.a.m.  7. Neurontin 1200 mg q.h.s. and 400 t.i.d.  8. Klonopin 1 mg q.6h. p.r.n.  9. Altace 10 mg daily.  10.      Coumadin 10 mg daily.  11.      Zocor 80 mg daily.  12.      Lipitor 40 mg.  13.      Effexor XR 300 mg q.h.s.  14.      Trazodone 200 mg q.h.s.   PHYSICAL EXAMINATION:  GENERAL:  Within normal limits.  NEUROLOGIC:  Nonfocal.   LABORATORY DATA:  Routine admission labs within normal limits.   MENTAL STATUS EXAM:  Slightly sedated, droopy eyes, depressed, hopeless,  helpless.  Positive suicidal ideation without plan.  No psychotic symptoms.  Cognitively intact.  Poor insight.  Judgment poor.   ADMISSION DIAGNOSES:   AXIS I:  Major depression, recurrent, severe, and benzodiazepine dependence.   AXIS II:  Deferred.   AXIS III:  1. Diabetes mellitus type 2, steroid induced.  2. Chronic obstructive pulmonary disease.  3.  Arthritis.  4. Deep vein thromboses by history.   AXIS IV:  Severe with minimal support system.   AXIS V:  28/60.   HOSPITAL COURSE:  The patient was admitted, ordered routine p.r.n.  medications, underwent further monitoring, and was encouraged to participate  in individual, group, and milieu therapy.  The patient was decreased on  Effexor and timing was changed and the patient was placed on the Librium  detox protocol due to clear inability to take benzodiazepines as prescribed  and history of abuse.  The patient was gradually stabilized on medications  targeting depressive symptoms and anxiety.  Effexor was reoptimized and  Neurontin optimized to control acute anxiety.  The patient was monitored for  safety and reported gradual improvement in mood  and anxiety.  Agitated at  times, manicy at times, somewhat somatically preoccupied but reported a  decrease in suicidal thoughts and a decrease in withdrawal symptoms as she  was detoxed.  The patient reported significant improvement and her condition  at discharge markedly improved.  She was more euthymic, affect brighter,  thought process goal directed, thought content negative for dangerous  ideation or psychotic symptoms.  There were no acute withdrawal symptoms and  she reported motivation to remain absent from benzodiazepines due to her  addiction.   DISCHARGE MEDICATIONS:  1. Darvocet.  2. Amaryl.  3. Lasix.  4. Glucophage.  5. Librium to taper over a one-week period.  6. Vistaril p.r.n.  7. Seroquel 25 mg 2 q.8h. p.r.n. agitation.  8. Lipitor.  9. Aspirin.  10.      Protonix.  11.      Effexor XR 150 mg 2 q.a.m.  12.      Neurontin 400 mg t.i.d. and 300 q.h.s.  13.      Xopenex nebulizer solution.  14.      Atrovent nebulizer solution.  15.      Trazodone 100 mg 2 q.h.s.  16.      Pulmicort.  17.      Singulair.  18.      Seroquel 300 mg q.h.s.   FOLLOW UP:  The patient was to follow up at [facility] and with Dr.  Meredith Mody,  Dr. Remo Lipps, and family practice clinic Monday, 10/11, at 9:55 and at  [facility] 10/7 at 10 a.m.   DISCHARGE DIAGNOSES:   AXIS I:  Major depression, recurrent, severe, and benzodiazepine dependence.   AXIS II:  Deferred.   AXIS III:  1. Diabetes mellitus type 2, steroid induced.  2. Chronic obstructive pulmonary disease.  3. Arthritis.  4. Deep vein thromboses by history.   AXIS IV:  Severe with minimal support system.   AXIS V:  GAF on discharge was 55.                                               Jeanice Lim, M.D.    JEM/MEDQ  D:  10/23/2003  T:  10/24/2003  Job:  [id number]

## 2011-05-07 NOTE — Discharge Summary (Signed)
[name], [name]                ACCOUNT NO.:  [id number]   MEDICAL RECORD NO.:  [id number]          PATIENT TYPE:  INP   LOCATION:  5740                         FACILITY:  [facility]   PHYSICIAN:  [name], M.D.DATE OF BIRTH:  [birth year]   DATE OF ADMISSION:  11/06/2004  DATE OF DISCHARGE:  11/15/2004                                 DISCHARGE SUMMARY   ADDENDUM:  1.  Pulmonary embolism requiring anticoagulation. [name] INR was 2.7      this morning, down from 2.9. This is stable and deemed appropriate for      discharge. The patient was discharged home on Coumadin 7.5 mg 1 tablet      p.o. q.h.s., #20, no refills. The patient is aware that she needs to      call for lab visit at [facility] on November 16, 2004.  2.  Nicotine replacement. The patient was given a prescription for nicotine      patch 14 mg. Please consider a taper.  3.  Anxiety. The patient was discontinued on Ativan during this      hospitalization and continued on Klonopin. The patient was given a      prescription for Klonopin 1 mg 2 tablets p.o. q.a.m. or 1 tablet p.o. at      lunch at dinner #60 with no refill. This was to last approximately 2      weeks if taken as directed.  4.  Other medication refills. The patient stated that she was out of Lasix      and Singulair. She was given prescription for Lasix 40 mg 1/2 p.o. q.d.      #30 no refill. She was given a prescription for Singulair 10 mg 1 tablet      p.o. q.d. #30 no refill. The patient also asked to have her Percocet      refilled. Per this H&P, this is filled by Dr. Prince Rome. The patient was      informed that she needs to discuss her Percocet refill with the      appropriate physician.       GSD/MEDQ  D:  11/15/2004  T:  11/15/2004  Job:  [id number]   cc:   Alvira Philips, M.D.  [facility].  Family Prac. Resident  [place]  [place] [address]  Fax: [phone]

## 2011-05-07 NOTE — Consult Note (Signed)
[name], [name]                          ACCOUNT NO.:  [id number]   MEDICAL RECORD NO.:  [id number]                   PATIENT TYPE:  IPS   LOCATION:  0501                                 FACILITY:  [facility]   PHYSICIAN:  Karlene Einstein, M.D.             DATE OF BIRTH:  1941-07-09   DATE OF CONSULTATION:  09/17/2003  DATE OF DISCHARGE:                                   CONSULTATION   CONSULTING PHYSICIAN:  Karlene Einstein, M.D.   REQUESTING PHYSICIAN:  Geoffery Lyons, M.D.   REASON FOR CONSULTATION:  Manage COPD, steroid induced diabetes mellitus,  hypertension, hyperlipidemia.   HISTORY OF PRESENT ILLNESS:  A 70 year old white female who was admitted  secondary to Klonopin abuse is requested for consultation of above medical  problems.  Per the patient, her COPD is usually very difficult to control.  She feels tight in her chest.  She is short of breath and she has wheezing.  She prefers the nebulizer treatments over the MDIs.  Her diabetes is steroid  induced secondary to being on prednisone multiple times for the difficult to  control COPD.  She takes Amaryl 4 mg every day at home which does not seem  to control her blood sugars well though.  She also states that she has had a  DVT recently and was hospitalized at [facility].  She was told that  she needed to be on Coumadin for life.   REVIEW OF SYSTEMS:  CONSTITUTIONAL:  No fever, chills.  EYES:  She does have  some blurry vision but no diplopia or dry eyes.  ENT:  No pain or tinnitus.  RESPIRATORY:  As in HPI.  CARDIOVASCULAR:  No palpitations.  She does have  edema.  GI:  No abdominal pain, nausea, vomiting.  GU:  No dysuria or  hematuria.  MUSCULOSKELETAL:  No pain or weakness.  SKIN:  A lot of bruising  otherwise no rash or pruritus.  NEUROLOGIC:  No weakness, numbness or  tingling.   PAST MEDICAL HISTORY:  1. COPD.  2. Anxiety.  3. Hypertension.  4. Hyperlipidemia.  5. Steroid induced diabetes mellitus.  6. Depression.  7. History of DVT.  8. Obesity.  9. Ongoing tobacco abuse.   PAST SURGICAL HISTORY:  1. Cholecystectomy, in 1981.  2. Hysterectomy for cervical cancer, in 1969.  3. History of ephedrine abuse.  4. History of superficial venous thrombosis, in 2003.   SOCIAL HISTORY:  The patient is on disability secondary to COPD.  She lives  by herself in an apartment.  She is divorced.  She has greater than 20 pack  a year history of tobacco, now approximately half to one pack per day.  Denies alcohol use or drug use.  She is estranged from her two daughters.   FAMILY HISTORY:  Coronary artery disease and diabetes mellitus.   ALLERGIES:  BACTRIM causes rash.   MEDICATIONS:  1. Xopenex  1.25 mg q.i.d.  2. Altace 10 mg every day.  3. Amaryl 4 mg every day.  4. Aspirin 81 mg every day.  5. Atrovent nebulizer 500 mcg q.i.d.  6. Effexor 300 mg q.h.s.  7. Klonopin 2 mg q.i.d.  8. Lipitor 40 mg every day.  9. Neurontin 400 mg t.i.d.  10.      Nexium 40 mg every day.  11.      Pulmicort Respules 2 mg b.i.d.  12.      Seroquel 50 mg b.i.d. and 200 mg q.h.s.  13.      Singulair 10 mg every day.  14.      Trazodone 200 mg q.h.s.   PHYSICAL EXAMINATION:  VITAL SIGNS:  Afebrile, pulse 123, respiratory rate  20, blood pressure 123/77, CBGs 248/282.  GENERAL:  No acute distress, cooperative, obese.  EYES:  Pupils equal, round reactive to light.  Extraocular muscles intact.  ENT:  Oropharynx moist and clear.  NECK:  Supple.  No JVD or thyromegaly.  NODES:  No cervical lymphadenopathy.  LUNGS:  Normal effort.  Diffuse intermittent wheezing.  No rales or rhonchi.  HEART:  Tachycardic.  Normal S1 and S2.  No murmurs, gallops or rubs.  ABDOMEN:  Soft, obese, nontender.  Positive bowel sounds.  No organomegaly.  EXTREMITIES:  Pulses +1.  Stockings in place.  SKIN:  Multiple ecchymoses diffusely.  No rashes.  MUSCULOSKELETAL:  Full range of motion.  No tenderness or deformities.  NEURO:   Alert and oriented  x 3.  No focal deficits.   RECENT LABORATORIES:  PT 18.3, INR 1.8.  Urine drug screen negative.  Alcohol level less than 5.  Glucose 191.  Creatinine 0.9.   ASSESSMENT/PLAN:  1. Chronic obstructive pulmonary disease.  I do not think she is currently     having an episode of exacerbation.  Check pulse oxygen.  We will continue     her nebulizers and Singulair.  Change albuterol to Xopenex.  We will     follow closely.   1. Steroid induced diabetes mellitus.  Capillary blood glucoses elevated.     We will restart Amaryl at 4 mg every day.  Check capillary blood glucose     before meals and every night.  Will also use sliding scale insulin for     better control.  Check hemoglobin A1c.  Will adjust medications based on     the capillary blood glucose.   1. History of deep vein thrombosis.  I am not sure when her last episode of     deep vein thrombosis is.  Her history is very unreliable and     inconsistent; however, since she says that she had a recent blood clot     and was hospitalized at [facility] and was told to be on permanent     anticoagulation, we will check her left lower extremity dopplers.  For     now it is okay to not give her Coumadin, since I also do not think there     is an indication for chronic Coumadin use.   1. Hypertension - stable.  Continue Altace.   1. Gastroesophageal reflux disease.  We will start Protonix, since she was     on Nexium.   1. Hyperlipidemia.  I will check a fasting lipid panel, start Lipitor.  Karlene Einstein, M.D.    GD/MEDQ  D:  09/17/2003  T:  09/17/2003  Job:  [id number]

## 2011-05-07 NOTE — H&P (Signed)
[facility]  Patient:    [name], [name] Visit Number: [id number] MRN: [id number]          Service Type: EMS Location: [facility] Attending Physician:  Pearletha Alfred Dictated by:   Candi Leash. Orsini, N.P. Admit Date:  07/01/2002                     Psychiatric Admission Assessment  IDENTIFYING INFORMATION:  A 70 year old single white female, voluntarily admitted on June 29, 2002.  HISTORY OF PRESENT ILLNESS:  The patient presents with a history of depression, reported to the emergency department stating that she had had a fall and some weakness at home.  The patient was medically cleared.  The emergency department records state that the patient was reporting depressive symptoms over her mothers death, lack of social support, feeling very lonely and could not stop crying.  The patient reports that she had taken a bottle of amphetamines "mini thins" every day to increase her energy.  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  She denies any homicidal or suicidal ideation.  Her sleep has decreased.  Her appetite has decreased.  The patient has hospice assisting with her grief of her mother.  She states her children never visit and feels very lonely.  The patient feels very isolated.  PAST PSYCHIATRIC HISTORY:  Second hospitalization to [facility], and was recently discharged.  Sees Dr. Betti Cruz as an outpatient and [facility].  No history of a suicide attempt.  SOCIAL HISTORY:   A 70 year old single female with 2 daughters.  She is on disability.  Her mother recently passed in May 2003.  FAMILY HISTORY:  Unknown.  ALCOHOL DRUG HISTORY:  The patient smokes, has a history of amphetamine use.  PAST MEDICAL HISTORY:  Primary care Dajane Valli is Dr. Lenard Galloway.  Medical problems are COPD, diabetes and hypertension.  MEDICATIONS:  Ventolin inhaler 2 puffs q.i.d. p.r.n., Klonopin 1 mg q.i.d., Seroquel 200 mg q.h.s. and Lasix 40 mg  1-1/2 tab p.o. q.a.m., Neurontin 400 mg t.i.d. and Lipitor 20 mg p.o. q.a.m., aspirin 325 mg 1 p.o. q.a.m., Altace 10 mg q.a.m., Amaryl 4 mg p.o. q.a.m., ______ 10 mg p.o. q.a.m., Protonix 40 mg p.o. q.a.m.  DRUG ALLERGIES:  SULFA, BACTRIM.  PHYSICAL EXAMINATION:  Performed at [facility].  CBC 11.9, glucose was 156. The patients alcohol level was less than 0.5.  Urinalysis was yellow.  C CAT scan negative.  Blood sugar was 104.  The patient has a bruise to her right arm.  Urine drug screen is positive for amphetamines and positive for tricyclics.  MENTAL STATUS EXAMINATION:  Alert, middle-aged, casually dressed, unsteady gait.  The patient is cooperative.  Speech is clear.  Mood is depressed and anxious, affect is depressed, anxious, somewhat labile.  Thought processes are coherent.  No evidence of psychosis, no auditory or visual hallucinations, no suicidal or homicidal ideation.  Cognitive:  Decreased concentration, decreased focusing.  Memory is fair, judgment is poor, insight is fair.  ADMISSION DIAGNOSES: Axis I:    1. Major depression, recurrent.            2. Rule out anxiety disorder secondary to medications, stimulant               abuse. Axis II:   Deferred. Axis III:  Asthma, diabetes, chronic obstructive pulmonary disease, and            hypertension. Axis IV:   Problems with primary support  group, lack of support, grief issues,            other psychosocial problems, medical problems. Axis V:    Current is 30, this past year 66.  PLAN:  Voluntary admission to [facility] for suicidal thoughts and depression.  Contract for safety, check every 15 minutes.  The patient is considered to be a fall risk.  We will resume her routine medications.  We will encourage fluids.  Will monitor her CBGs and put patient on diabetic diet.  Increase her coping skills.  Have Vistaril available for anxiety.  Case worker is to discuss living arrangements after  discharge.  TENTATIVE LENGTH OF CARE:  4-6 days. Dictated by:   Candi Leash. Orsini, N.P. Attending Physician:  [place] B DD:  07/01/02 TD:  07/02/02 Job: [id number]

## 2011-05-07 NOTE — Discharge Summary (Signed)
[name], [name]                ACCOUNT NO.:  [id number]   MEDICAL RECORD NO.:  [id number]          PATIENT TYPE:  INP   LOCATION:  5732                         FACILITY:  [facility]   PHYSICIAN:  Broadus John T. Pickard II, MDDATE OF BIRTH:  24-Mar-1941   DATE OF ADMISSION:  12/01/2006  DATE OF DISCHARGE:  12/03/2006                               DISCHARGE SUMMARY   DISCHARGE DIAGNOSES:  1. COPD exacerbation.  2. Diabetes.  3. Bipolar disorder.  4. Hypertension.  5. Generalized anxiety disorder.  6. Chronic Coumadin use secondary to a DVT.   PROCEDURES IN THE HOSPITAL:  Included:  1. A chest x-ray which shows right lower lobe atelectasis versus      infiltrate.  2. CT angiogram was performed which showed no PE, but it did show      possibly a right lower lobe pneumonia.  3. Chest x-ray on December 14 showed no acute abnormality, the right      lower lobe density had resolved and was likely atelectasis.   LABS:  White count 7.6, hemoglobin 9.9, this is chronic and patient is  scheduled for a colonoscopy was an outpatient, hematocrit is 30,  platelet count is 215, MCV is 88, RDW is 14.9, INR is 1.4, sodium is  140, potassium is 3.5, chloride is 110, bicarb is 24, glucose is 116,  BUN is 5, creatinine 0.4.  Cardiac markers were negative x1.  Point-of-  care:  BNP was 126.  Urinalysis was negative for infection.   HISTORY AND PHYSICAL:  Please see the dictated History and Physical on  the chart.   In short, patient is a 70 year old Caucasian female with a history of  generalized anxiety disorder and bipolar disorder poorly controlled with  diabetes mellitus type 2, COPD and hypertension who presented through  the emergency room with shortness of breath and was diagnosed with a  COPD exacerbation and possible right lower lobe pneumonia.  She was  admitted for management.   PROBLEM:  1. COPD exacerbation, right lower lobe pneumonia:  Patient was started      on prednisone 40 mg p.o.  q.day for a total of ten days,      Azithromycin as well as Rocephin.  Patient was afebrile in the      hospital and was monitored for 24 hours on p.o. antibiotics prior      to discharge home.  She was breathing comfortably, sating 98% on      her home dose of oxygen of four liters so her breathing had much      improved.  She is discharged home to finish a ten-day course of      prednisone, a five-day course of Azithromycin, p.r.n. albuterol and      Atrovent as needed.  2. Generalized anxiety disorder:  Patient is to continue her Klonopin      at the 1 mg p.o. t.i.d. dose.  3. Bipolar disorder:  Patient is instructed to continue her multiple      psych medications that include:  Haldol 1 mg daily, Trazodone 300  mg p.o. q.day, Neurontin 400 p.o. t.i.d. and 1200 at bedtime,      Effexor 300 daily, Klonopin 1 p.o. t.i.d., Topamax 200 mg at      bedtime.  4. Diabetes mellitus:  The patient is instructed to continue her      Glucophage 1000 mg p.o. b.i.d., Actos 45 mg q.day and Amaryl 8 mg      p.o. q.day.  5. COPD maintenance:  The patient is instructed to continue her Advair      250/50 one p.o. inhaled b.i.d.  6. Anemia baseline, chronic with a hemoglobin around 10:  She is      guaiac-positive and she is scheduled for an outpatient colonoscopy      with Dr. Kinnie Scales, that was scheduled as an outpatient to work that      up.  7. Hyperlipidemia:  Patient is instructed to continue her Vytorin      10/40 p.o. q.day.  8. Chronic renal insufficiency:  She is to continue her Lasix 40 mg      p.o. q.day.  9. GERD:  Patient is instructed to continue her Prilosec 20 mg p.o.      q.day.   DISCHARGE MEDICATIONS:  1. Prednisone 40 mg p.o. q.day until December 20.  2. Azithromycin 500 mg p.o. q.day until December 18.  3. Haldol 1 mg p.o. q.day.  4. Trazodone 300 mg p.o. q.day.  5. Neurontin 400 mg p.o. t.i.d. and 1200 p.o. q.h.s.  6. Effexor 300 mg p.o. q.day.  7. Klonopin 1 mg p.o.  t.i.d.  8. Topamax 200 mg p.o. q.h.s.  9. Actos 45 mg p.o. q.day.  10.Amaryl 8 mg p.o. q.day.  11.Glucophage 1000 mg p.o. b.i.d.  12.Advair 250/50 one inhaled b.i.d.  13.Albuterol one puff four times a day as needed.  14.Coumadin 2 mg a day.  15.Enalapril 40 mg p.o. q.day.  16.Ferrous sulfate 325 p.o. t.i.d.  17.Lasix 40 mg p.o. q.day.  18.Prilosec 20 mg p.o. q.day.  19.Vytorin 10/40 one p.o. q.day.   DISCHARGE INSTRUCTIONS:  She is instructed to follow up with Dr. Tanya Nones  on December 21 at 3:30 in the afternoon to check for resolution of  pneumonia.      Broadus John T. Pamalee Leyden, MD     WTP/MEDQ  D:  12/03/2006  T:  12/04/2006  Job:  [id number]

## 2011-05-07 NOTE — H&P (Signed)
[name], [name]                ACCOUNT NO.:  [id number]   MEDICAL RECORD NO.:  [id number]          PATIENT TYPE:  INP   LOCATION:  5708                         FACILITY:  [facility]   PHYSICIAN:  Adrian Blackwater, MDDATE OF BIRTH:  Mar 01, 1941   DATE OF ADMISSION:  08/10/2006  DATE OF DISCHARGE:  08/18/2006                                HISTORY & PHYSICAL   ADMISSION DATE:  August 10, 2006   ATTENDING PHYSICIAN:  Dr. Tawanna Cooler McDiarmid   RESIDENT:  Dr. Dorathy Daft   CHIEF COMPLAINT:  Unable to ambulate.   HISTORY OF PRESENT ILLNESS:  The patient transferred from [facility] after  being evaluated by Dr. Radford Pax.  The patient with almost daily visit to the  ER with complaint of shortness of breath, frequent falls.  Workup normal.  No acute findings, except bruises from trauma.  Hemodynamically stable.  The  patient states she feels very weak, unable to ambulate.  Will not be able to  live by herself.  She talked to her daughter already.  Will need to be  placed at SNF or other facility for assistance.   REVIEW OF SYSTEMS:  Generalized weakness.  Denies palpitation.  No shortness  of breath, nor chest pain.  Skin very tender on legs.   MEDICAL PROBLEMS:  1. Diabetes mellitus type 2, uncomplicated.  2. Hyperlipidemia.  3. Major recurring depression.  4. Anxiety.  5. Hypertension.  6. COPD.  7. Osteopenia.  8. Obesity.  9. Tobacco abuse.  10.History of deep leg thrombophlebitis.  11.History of cataracts.  12.History of allergic rhinitis.   CURRENT MEDICATIONS:  1. Actos 45 mg p.o. daily.  2. Advair Diskus 200/50 inhaled b.i.d.  3. Albuterol q.i.d. p.r.n.  4. Amaryl 8 mg p.o. daily.  5. Aspirin daily.  6. Atrovent 2 puffs inhaled q.i.d.  7. Coumadin, unsure of doses.  8. Effexor 300 mg p.o. nightly.  9. Enalapril 40 mg p.o. daily.  10.Flonase 2 puffs q. nostril daily.  11.Glucophage 1000 b.i.d.  12.Klonopin 1 mg p.o. t.i.d.  13.Lasix 20 mg p.o. daily.  14.Neurontin  400 mg t.i.d.  15.Prilosec 20 mg p.o. daily.  16.Singulair 10 mg p.o. daily.  17.Trazodone 200 mg p.o. nightly.  18.Vytorin 10/40 mg p.o. daily.   ALLERGIES:  BACTRIM DS CAUSES RASH.   OTHER PAST MEDICAL HISTORY:  1. Cholecystectomy in 1981.  2. Hysterectomy for cervical cancer in 1969.  3. Superficial venous thrombosis in October 2003.  4. History of blood clots and portal hypertension ?Marland Kitchen   FAMILY HISTORY:  Father:  Coronary artery disease.  Mother died of heart  failure.  Aunt with diabetes.  Uncle with multiple strokes.  No history of  cancer.   SOCIAL HISTORY:  Disabled due to COPD.  Lives in apartment in [place].  Divorced since 37.  Twenty pack per year history of tobacco, quit in  November 2005.  No alcohol.  No drugs.  One sister and 2 daughters in  [place].  The patient has home assistance through [facility],  [phone].  Care manager, Sheran Spine.   PHYSICAL EXAMINATION:  VITAL SIGNS:  Temperature 98.7, heart rate 88, blood  pressure 101/67, respiratory rate 16, O2 95% on 2 liters.  GENERAL APPEARANCE:  Lethargic, arousal to voice, answering questions with  repetition and following commands.  HEENT:  Normocephalic, PERRLA, dry mucous membranes.  LUNGS:  Clear to auscultation bilaterally.  Good respiratory effort.  CVS:  S1 and S2 normal, RRR.  No murmur, rubs, or gallops.  ABDOMEN:  Soft, positive bowel sounds, nontender, nondistended.  No  hepatosplenomegaly palpable.  EXTREMITIES:  With 1+ pitting edema on lower extremity up to knee level  bilaterally with multiple hematomas on right knee and leg.  RECTAL:  Deferred.  NEUROLOGICAL:  Cranial nerves intact II-XII.  Strength 5/5 bilateral lower  extremities and upper extremities.  Deep tendon reflexes decreased, unable  to obtain.  Balance/gait unexplored.  SKIN:  Bruises as described above.   LABORATORY TESTS AT ER:  BMP within normal limits.  Liver function test  normal.  CBC normal.  Chest  x-ray:  Cardiomegaly with small pleural  effusions.  Chest x-ray from August 06, 2006, COPD, acute right anterior  sixth rib fracture.  X-ray on August 12, 13, and 16, 2007 showed just COPD.  August 02, 2006, right knee soft tissue swelling anterior patella.  July 31, 2006, myocardial perfusion test with SPECT shows left ventricular  ejection fraction of 63% and a small area of ischemia in anterior wall.  July 22, 2006, CT scan of the head without contrast shows mild  degenerative changes.   ASSESSMENT AND PLAN:  1. Lethargy, medicament of medicine overdose, improvement, responsive to      voice.  Hemodynamically stable.  Normal respiration.  Good saturations      with O2 on 2 liters.  No distress.  Will give intravenous fluid      maintenance and keep n.p.o.  2. Lower extremity pain and weakness.  Multiple bruises causing pain,      unsure of reason for frequent falls.  Probably medication and      benzodiazepine overdose.  Almost daily emergency room visits during the      last couple of months.  Several chest x-rays with diagnosis of chronic      obstructive pulmonary disease and no acute findings.  CT scan of the      head with no acute findings.  Coronary origin of falls that cause the      trauma, rule out with myocardial perfusion test.  These episodes could      be deconditioning secondary to psychiatric illness and overdose.  The      patient verbally states she is no longer able to take care of herself      and will need facility for rehab.  3. Diabetes mellitus type 2.  Continue home medications.  4. Chronic anticoagulation.  PT/INR to be checked by pharmacy.  Coumadin      also per pharmacy.  No signs of active bleeding.  Rectal deferred at      this point due to patient sleeping.  5. Hypertension, well controlled.  Continue home medications.  6. Chronic obstructive pulmonary disease, well controlled.  Asymptomatic      at this point.     Adrian Blackwater,  MD     IM/MEDQ  D:  10/03/2006  T:  10/04/2006  Job:  [id number]

## 2011-05-07 NOTE — H&P (Signed)
[facility]  Patient:    [name], [name]                       MRN: [id number] Adm. Date:  [id number] Attending:  Donnetta Hutching Dictator:   Young Berry. Lorin Picket, N.P.                   Psychiatric Admission Assessment  DATE OF ADMISSION:  December 18, 2000  CHIEF COMPLAINT:  "I came in here to get completely off Xanax and Mini  PATIENT IDENTIFICATION:  This is a 70 year old white divorced female, voluntary admission to [facility].  REASON FOR ADMISSION AND SYMPTOMS:  The patient reports she has been helping her 63 year old mother over the past year, causing her increased stress.  Over this past year she has been taking Mini Thins and over-the-counter diet pill with ephedrine per her description, and also abusing Xanax which she obtained from acquaintances in the community.  She has been taking approximately 8 1-mg tablets daily for about a year.  Patient now wants to be able to bring her mother back to her mothers own home and wants to be able to help her; however, this is following her mothers hip fracture in October.  However, her daughter will not permit her to bring the mother back to the mothers home unless she is free of drugs and the daughter feels safe with her managing her mother.  She recognizes that she needs to be free of the Xanax  and free of the mother medications to have herself well organized in order to be effective.  She denies that she has had any suicidal or homicidal ideation recently, but did feel down and depressed that she could not help her mother. She says that she wants to prove to herself that she can be off the Xanax. She did sleep well last night.  She denies any auditory or visual hallucinations at this time; however, on closer questioning she admits that she is seeing some black spots floating around in her vision and they move constantly and they are presently current.  She denies seeing any colors, denies hearing any  voices.  PAST PSYCHIATRIC HISTORY:  Patient has been treated by Dr. Wynonia Lawman for about greater than 5 years for depression and mood swings, until he moved his office recently, so she has most recently switched to Dr. Betti Cruz as her attending physician, and most recently saw him this past November when he started her on Topamax for a bipolar illness that he mentioned to her, and he was due again to see her this February of 2002.  She has been treated in the past by Dr. Wynonia Lawman with both Neurontin and with Depakote, which she said made her too fatigued, and so she did not like those.  She has been hospitalized in the past in 1998 at [facility] for substance abuse and Xanax abuse, and for some other drug that she does not remember.  She was also treated at [facility] in 1997 for depression and for abuse of Xanax, Vicodin and Lorcet.  Patient admits to a history of self-inflicted wounds and cuts on herself at age 56; however, states she has not cut on herself since that time.  SUBSTANCE ABUSE HISTORY:  She denies any alcohol abuse.  She smokes 2 to 3 cigarettes a day but admits to a history of heavy smoking many years previous, and she admits to abusing Xanax and over-the-counter stimulants and history  of other prescription drug abuse as previously noted in her psychiatric history.  PAST MEDICAL HISTORY:  Her primary care physician is Dr. Lennette Bihari here in [place] who she last saw approximately 2 months ago for medication management with her inhalers.  She admits to medical problems of high blood pressure, diabetes, depression, asthma, COPD, and poor circulation in her legs, particularly in her left leg.  She was hospitalized in 1989 for a cholecystectomy and in 99 again for recurrence of gallstones and removal of the same.  She also had a hysterectomy in 1969 for cervical cancer with no history of recurrence.  She was hospitalized most recent November 20 at [facility] for  a bout of pneumonia in her right lung and was also admitted in 2000, also for a respiratory infection.  Current medications are Altace 10 mg q.d., Amaryl 4 mg q.d., Effexor 150 mg every morning, Singulair 10 mg q.d., Doxepin 75 mg 3 tablets at h.s., Topamax 25 mg 2 tablets at h.s., albuterol 4 puffs q.i.d., Lasix 40 mg daily, K-Dur 20 meq daily, Flovent 220 mg 2 puffs q.a.m. and q.p.m., Nexium 40 mg b.i.d., Flonase nasal spray 2 puffs daily in each nostril.  She gets her medications from [facility] on [address] phone [phone].  PERTINENT PHYSICAL FINDINGS:  Please refer to the Emergency Department note which we will order up for the records.  She was seen in [facility] Emergency Department on December 18, 2000.  Labs from the Emergency Department indicate that her alcohol level was less than 5, was positive for benzos, and positive for tricyclics.  SOCIAL HISTORY:  She was born and raised in [place], graduated from high school.  She is currently on disability since 1995 for COPD.  She was married 3 times, now divorced since 1983 from her most recent marriage.  She lives alone in her own home; however, she is currently staying in her mothers house here in [place].  She has 2 daughters, aged 44 and 45.  She describes her mother and her son-in-law as her best personal supports.  She denies any physical abuse in her childhood history.  FAMILY HISTORY:  Positive for father who was a heavy alcohol drinker and for one sister who has depression.  MENTAL STATUS EXAMINATION:  Patient is a casually dressed, appropriately groomed 70 year old white female in acute distress, constantly moving and fidgeting throughout the interview, but is otherwise cooperative and pleasant with good eye contact.  Affect is mildly constricted.  Speech is slightly pressured with a rapid pace and clipped responses.  Mood is slightly depressed.  Thought process is somewhat scattered with some  tangential thinking present, but it is otherwise coherent and relevant, and she is responsive to questions as asked.  She is oriented x 3.  She is somewhat slow  to remember the specifics on both distant and most recent dates, but otherwise her cognitive function seems intact.  She has negative suicidal or homicidal ideation, and hallucinations seem limited solely to the presence of some floating black spots in her vision which we will explore a little bit further.  ADMISSION DIAGNOSES: Axis I:    1. Depression.            2. Rule out attention deficit hyperactivity disorder.            3. History of bipolar diagnosis. Axis II:   Deferred. Axis III:  COPD, high blood pressure, diabetes, asthma. Axis IV:   Severe, related to problems with primary  support group, and            related to the social environment, and moderate medical problems. Axis V:    Current score is 25, past year 55.  INITIAL PLAN OF CARE:  To stabilize her mood and attempt to focus her thinking.  We will detox her off the benzodiazepines and begin this process. We have already started her on a Klonopin taper.  We will clarify the doses of her inhalers and get those straightened out and we will work on exploring and assisting her with her primary support group issues.  We will continue her antidepressants.  We will order the emergency room records from [facility]. DD:  12/19/00 TD:  12/19/00 Job: [id number]

## 2011-05-07 NOTE — Discharge Summary (Signed)
[name], [name]                ACCOUNT NO.:  [id number]   MEDICAL RECORD NO.:  [id number]          PATIENT TYPE:  INP   LOCATION:  5511                         FACILITY:  [facility]   PHYSICIAN:  Leighton Roach McDiarmid, M.D.DATE OF BIRTH:  [birth year]   DATE OF ADMISSION:  01/12/2007  DATE OF DISCHARGE:  01/14/2007                               DISCHARGE SUMMARY   DISCHARGE DIAGNOSES:  1. Supratherapeutic INR, resolved.  2. Epistaxis, resolved.  3. 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  4. Type 2 diabetes.  5. Hyperlipidemia.  6. Bipolar disorder.  7. Hypertension.  8. Chronic obstructive pulmonary disease with home oxygen requirement   CONSULTATIONS:  Antonietta Breach, M.D., psychiatry   PROCEDURE:  The patient received 1 unit of fresh frozen plasma at [facility] ED prior to transfer to [facility] for admission.  No  other procedures were performed.   DISCHARGE MEDICATIONS:  1. Haldol 1 mg p.o. daily at bedtime.  2. Trazodone 300 mg p.o. daily at bedtime.  3. Neurontin 400 mg p.o. three times daily and 1200 mg p.o. at      bedtime.  4. Effexor 300 mg p.o. daily in the morning.  5. Klonopin 1 mg p.o. three times daily.  6. Topamax 200 mg p.o. q.h.s.  7. Actos 45 mg p.o. daily with a meal.  8. Amaryl 8 mg p.o. daily with a meal.  9. Glucophage 1000 mg p.o. twice daily with food.  10.Advair 250/50, 1 inhalation twice daily.  11.Albuterol MDI 1-2 puffs every 4 hours as needed.  12.Enalapril 40 mg p.o. daily.  13.Ferrous sulfate 325 mg p.o. b.i.d.  14.Lasix 40 mg p.o. daily.  15.Prilosec 20 mg p.o. daily.  16.Vytorin 10/40 mg p.o. daily.  17.The patient is to stop taking Coumadin at the time of discharge.   PERTINENT LABORATORY DATA:  On initial presentation to [facility] ED,  the patient was found to have an INR of greater than 10.2 with a PT of  greater than 90.  The patient was given 1 unit of FFP as  well as 10 mg  of vitamin K IV at [facility] ED and upon arrival to [facility], the patient's INR had decreased to 1.8 and PT decreased of  21.4.  [name] initial CBC on presentation at [facility] ED  showed a white blood cell count of 5.9, hemoglobin of 8.8, hematocrit of  26.2, and platelet count of 237.  [name] hemoglobin dropped to a  nadir 8.2 during admission and increased to 9.5 by the time of  discharge.  At the time of discharge, the patient's PT was 15.7 and INR  1.2.  Hemoglobin A1c during admission was 6.1%.   BRIEF HOSPITAL COURSE:  Please see full dictated history and physical  for full details of admission presentation and initial workup.  In  brief, this is a 70 year old white female with multiple medical problems  and an extensive psychiatric history who presents presented to [facility] ED with a supratherapeutic INR  and nose bleed.  By the time of  transfer to [facility], the patient's INR had decreased to  below the therapeutic range and the patient's nosebleed had stopped.   1. Supratherapeutic INR:  The patient received vitamin K 10 mg IV plus      1 unit FFP at [facility] ED prior to transport.  Nose bleed and      INR remained resolved after arrival to [facility]. After a      discussion with the patient's primary MD, Dr. Lynnea Ferrier, the      patient does warrant need for chronic anticoagulation; however,      given her history of noncompliance and her presentation with a      supratherapeutic INR, it was decided to discontinue the patient's      anticoagulation upon discharge.  The patient had no evidence of      bleeding during or throughout her hospitalization.   1. Chest tightness:  The patient is status post Cardiolite in August      2007 showing low risk for significant cardiovascular event.  The      patient did not develop chest tightness until she was informed of      her possible discharge.  The patient  had a stat EKG and cardiac      enzymes which were both negative for signs of ischemia.  She was      given an albuterol breathing treatment as well as a GI cocktail      which resolved her chest pain or tightness.  Anxiety was suspected      as the primary etiology for the patient's chest tightness.   1. Psychiatric:  The patient was continued on her multiple home      psychiatric medications including Effexor, Klonopin, trazodone      Topamax, Neurontin and Haldol.  In addition to chest tightness as      mentioned above, when discharge was discussed with the patient, the      patient threatened that she would kill herself if she was      discharged home on that day.  Dr. Jeanie Sewer with psychiatry was      consulted to evaluate the patient and determined that the patient      was safe for discontinuation of suicide precautions and lonely but      not really suicidal and thus safe for discharge home.   1. Type 2 diabetes:  [name] CBGs remained relatively well      controlled during hospitalization.  A hemoglobin A1c was obtained      as mentioned above.  The patient was maintained on her home oral      hypoglycemics and a sliding scale insulin regimen was necessary.   1. Hypertension:  [name] blood pressures were well controlled      on her home dose of lisinopril and Lasix.   1. COPD:  The patient was continued on her home doses of Advair and      albuterol MDI as needed as well as her home oxygen.   1. Constipation:  The patient was given MiraLax x1 as well as Senokot      S b.i.d. for constipation.   1. Hyperlipidemia:  The patient was continued on home dose of Vytorin.   DISCHARGE INSTRUCTIONS:  The patient is to increase activity slowly.  The patient is to follow a diabetic diet.  The patient is to stop any  activity that causes chest pain, shortness of breath, dizziness,  sweating or excessive weakness.  FOLLOW-UP APPOINTMENTS:  The patient has a follow up  appointment with  Dr. Lynnea Ferrier at the [facility] on  Wednesday, January 30 at 10:00 a.m.   The patient was discharged home on home oxygen in stable condition with  home health aide to visit the patient daily during the week.     ______________________________  Drue Dun, M.D.    ______________________________  Leighton Roach McDiarmid, M.D.    EE/MEDQ  D:  01/19/2007  T:  01/19/2007  Job:  [id number]   cc:   Broadus John T. Pamalee Leyden, MD

## 2011-05-07 NOTE — Discharge Summary (Signed)
NAMESHIRELLE, TOOTLE                          ACCOUNT NO.:  [id number]   MEDICAL RECORD NO.:  [id number]                   PATIENT TYPE:  IPS   LOCATION:  0508                                 [facility]ACILITY:  [facility]   PHYSICIAN:  Jeanice Lim, MD                DATE OF BIRTH:  03-13-1941   DATE OF ADMISSION:  06/29/2002  DATE OF DISCHARGE:  07/12/2002                                 DISCHARGE SUMMARY   IDENTIFYING DATA:  This is a 70 year old single Caucasian female,  voluntarily admitted, presenting with a history of depression, reporting to  the emergency department with weakness, feeling lonely, unable to stop  crying, feeling unable to safely function on her own.   ADMISSION MEDICATIONS:  Ventolin, Klonopin, Seroquel, Lasix, Neurontin,  Lipitor, aspirin, Altace, Amaryl, Protonix.   ALLERGIES:  SULFA and BACTRIM.   PHYSICAL EXAMINATION:  Essentially within normal limits, neurologically  nonfocal.   ROUTINE ADMISSION LABS:  Alcohol level less than 5.  Glucose elevated at  156.  CT scan negative.  Urine drug screen positive for amphetamines,  positive for tricyclics.   MENTAL STATUS EXAM:  Alert, middle-aged, casually dressed female, with  somewhat unsteady gait.  Cooperative, speech clear, mood depressed, anxious,  dysphoric, labile.  Thought process goal directed.  Thought content negative  for psychotic symptoms.  The patient reported feeling that she could not go  on living like this.  Cognition was intact.  Judgment and insight poor.   ADMISSION DIAGNOSES:   AXIS I:  1. Major depression, recurrent, severe.  2. Generalized anxiety disorder.  3. Polysubstance abuse.   AXIS II:  None.   AXIS III:  Asthma, diabetes, chronic obstructive pulmonary disease and  hypertension.   AXIS IV:  Moderate problems with primary support group.   AXIS V:  30/55.   HOSPITAL COURSE:  The patient was admitted and ordered routine p.r.n.  medications, underwent further monitoring,  participated in individual, group  and milieu therapy including substance abuse treatment.  The patient was  restabilized on previous medications, ordered Doppler studies for complaint  of leg pain to rule out deep vein thrombosis, and eventually given  antibiotics for possible cellulitis.  The patient was titrated on Effexor,  targeting depressive symptoms and anxiety.  The patient became very labile  at the time of discharge, feeling that she was not ready to leave and unable  to care for herself by herself.  Discussion regarding possible group home or  assisted living pursued, however the patient was not willing to make this  change, wanted her independence, and decided that she was safe to be able to  be discharged after further monitoring.   CONDITION ON DISCHARGE:  Improved.  Mood was more euthymic, affect brighter,  thought process goal directed.  Thought content negative for dangerous  ideation or psychotic symptoms.  The patient reported motivation to be  compliant with followup  plan, take medications as directed, and to avoid  taking mini-thins or other substances of abuse.   DISCHARGE MEDICATIONS:  1. Klonopin 1 mg q.i.d.  2 .  Seroquel 200 mg q.h.s.  1. Lasix 40 mg 1-1/2 q.a.m.  2. Neurontin 400 mg t.i.d.  3. Trazodone 200 mg q.h.s.  4. Zocor 80 mg q.a.m.  5. Provigil 200 mg q.a.m.  6. Effexor XR 150 mg q.a.m. and q.3 p.m.  7. Atrovent 2.5 nebulizer q.i.d.  8. Zithromax 250 mg for 3 days.  9. Altace 10 mg q.a.m.  10.      Temaril 4 mg q.a.m.  11.      Singulair 10 mg q.a.m.  12.      Protonix 40 mg q.a.m.  13.      Flovent, Ventolin, and Flonase as previously prescribed.  14.      Aspirin 325 mg q.a.m.   DISPOSITION:  The patient was discharged to follow up with Dr. Betti Cruz August  5 at 12:30   DISCHARGE DIAGNOSES:   AXIS I:  1. Major depression, recurrent, severe.  2. Generalized anxiety disorder.  3. Polysubstance abuse.   AXIS II:  None.   AXIS III:   Asthma, diabetes, chronic obstructive pulmonary disease and  hypertension.   AXIS IV:  Moderate problems with primary support group.   AXIS V:  Global assessment of function on discharge was 55.                                               Jeanice Lim, MD    JEM/MEDQ  D:  08/15/2002  T:  08/17/2002  Job:  [id number]

## 2011-05-07 NOTE — H&P (Signed)
Kristen, [name]                          ACCOUNT NO.:  [id number]   MEDICAL RECORD NO.:  [id number]                   PATIENT TYPE:  INP   LOCATION:  4706                                 FACILITY:  [facility]   PHYSICIAN:  Maylon Peppers. Waynette Buttery, M.D.               DATE OF BIRTH:  December 21, 1940   DATE OF ADMISSION:  09/23/2002  DATE OF DISCHARGE:                                HISTORY & PHYSICAL   The patient is a 70 year old female who complained of chest pain in church  intermittent lasting 10 to 15 seconds, multiple episodes consisting of  diffuse substernal pain radiating to her back.  She denied fever, shortness  of breath, diaphoresis.  She states she has unlimited exercise tolerance.  Cardiac risk factors include diabetes mellitus, hyperlipidemia, age,  hypertension and tobacco use.   REVIEW OF SYSTEMS:  Negative for constitutional symptoms.  Negative for  respiratory symptoms other than her typical asthma and COPD symptoms.  Negative for skin symptoms.  Negative for GI complaints.  Positive for left  lower leg swelling alleviated by compression stocking.  Positive for  depression since her mother died in 05-28-2023.  Otherwise, negative.   PREVIOUS MEDICAL HISTORY:  Includes diabetes mellitus, hyperlipidemia,  depression, anxiety,  hypertension, asthma, COPD, and osteopenia.   ALLERGIES:  BACTRIM.   MEDICATIONS:  1. Albuterol.  2. Altace.  3. Amaryl.  4. Aspirin.  5. Atrovent.  6. Effexor.  7. Flovent.  8. Klonopin.  9. Lasix.  10.      Lipitor.  11.      Neurontin.  12.      Nexium.  13.      Singulair.  14.      Trazodone.   SURGICAL HISTORY:  Cholecystectomy in 05-27-80, cervical cancer in 1968/05/27,  ephedrine abuse, Xanax abuse, history of blood clots.   SOCIAL HISTORY:  The patient is disabled secondary to COPD.  Lives in  [place].  She is divorced.  She smokes three cigarettes a day, but has a  20-pack history aside from that.   FAMILY HISTORY:  Father and two aunts  with coronary artery disease, an aunt  with diabetes, an uncle and mother with strokes.  No cancer. Her mother died  in 05-28-2023 of congestive heart failure.   PHYSICAL EXAMINATION:  VITAL SIGNS:  Blood pressure  is 128/80, temperature  97.1, heart rate 89, respiratory rate 20. O2 saturation 96% on room air.  GENERAL:  The patient is well-developed, well-nourished female in no acute  distress.  HEENT:  AT, .  PERRLA.  EOMI.  The patient  is edentulous.  Mucous  membranes  moist.  Oropharynx is without erythema or exudate.  NECK:  Supple.  No masses.  No lymphadenopathy.  No thyromegaly.  LUNGS:  Have bilateral wheezing throughout with good respiratory effort.  CARDIOVASCULAR:  S1, S2 positive.  Regular rate and rhythm.  ABDOMEN:  Soft,  nontender, nondistended.  Bowel sounds positive. The patient  has multiple scars on her extremities.  RECTAL:  Hemoccult negative stool.   Labs include a white count of 7.5, hemoglobin 12.4, hematocrit 38.2,  platelet count 259, sodium 140, potassium 3.8, chloride 102, bicarb 31, BUN  13, creatinine 0.6, glucose 55, AST 24, ALT 26, alk phos 58, total Bili 0.4,  protein 6, albumin 3.4, CK 244, MB 6.7, troponin 0.03.  Chest x-ray showed  no acute disease.  EKG showed prolonged QT.  Normal sinus rhythm and some  nonspecific T wave changes.   ASSESSMENT/PLAN:  This is a 70 year old female with chest pain.  1. Chest pain.  Rule out myocardial infarction with enzymes and EKGs.  First     set of enzymes was mildly elevated.  However, will get a full set of     enzymes before consulting cardiology.  2. Hypertension.  Will continue Altace and Lasix.  Blood pressure  is well     controlled with these medicines.  3. Psych.  Will continue the patient's home medication regimen.  These     medicines are not to be altered except by her psychiatrist Dr. Betti Cruz.  4. Asthma and chronic obstructive pulmonary disease.  Will continue her home     meds for these.  5. Diabetes.   Will continue her Amaryl for this as it appears to be well     controlled.                                               Maylon Peppers Waynette Buttery, M.D.    SAG/MEDQ  D:  09/23/2002  T:  09/24/2002  Job:  [id number]

## 2011-05-07 NOTE — Discharge Summary (Signed)
NAMEGIANELLA, [name]                ACCOUNT NO.:  [id number]   MEDICAL RECORD NO.:  [id number]          PATIENT TYPE:  INP   LOCATION:  3737                         FACILITY:  [facility]   PHYSICIAN:  Broadus John T. Pickard II, MDDATE OF BIRTH:  March 10, 1941   DATE OF ADMISSION:  07/22/2006  DATE OF DISCHARGE:  07/31/2006                                 DISCHARGE SUMMARY   ADDENDUM FOR DISCHARGE SUMMARY   ATTENDING PHYSICIAN:  Dr. Pearlean Brownie.   Please see the previous discharge dated July 29, 2006 for details.  This  is an addendum.   ADMITTING DIAGNOSES:  1. Unresponsive.  2. Possible Klonopin overdose.  3. Chronic obstructive pulmonary disease.  4. Diabetes type 2.  5. Bipolar.  6. Depression.  7. History of  deep venous thromboses and pulmonary embolism x2.  8. Hypertension.  9. Hyperlipidemia.  10.Anemia.   HISTORY OF PRESENT ILLNESS:  1. Please see the dictated H&P on the chart.  In short, the patient is a      70 year old female who was found unresponsive.  Workup in the hospital,      included carotid Dopplers, which did not show any signs of  ICA      stenosis.  This was done to rule out possible etiologies of syncope.      Urine drug screen had benzos and amphetamines in it.  It was felt that      the patient likely had a syncopal episode secondary to medication      overdose/polypharmacy.  The patient also had a Cardiolite performed in      the hospital, which showed a small area of reversibility at the apex,      extending slightly into the anterior wall, concerning for a small area      of ischemia with an ejection fraction of 63%.  In consultation with Dr.      Tresa Endo of [facility] given the atypical nature of      the patient's chest pain and the likely cause for syncope, this was      likely a breast artifact and needed no further workup.  Therefore, for      the patient's syncope, it was felt to be secondary to medication      overdose,  likely Klonopin and also amphetamines, which the patient is      not prescribed.  2. UTI.  The patient completed a full course of Cipro during her hospital      stay and her symptoms improved.  3. COPD.  The patient was continued on her Advair, Atrovent and albuterol      inhalers, along with oxygen to keep her sats at 2% over night and the      patient did well with no complications.  4. Anemia.  Throughout the hospitalization, the patient had a hemoglobin      that was very low.  It appeared to be an iron deficiency anemia with a      low MCV.  She was started on iron sulfate b.i.d.  She was hemoccult  negative during her stay.  We will make her a followup appointment with      GI as an outpatient for a likely colonoscopy and possibly and EGD.  5. Anxiety and bipolar.  The patient was very agitated and almost manic      during the hospital stay.  This is baseline for the patient.      Psychiatry was consulted.  They recommended some medications including      some Haldol 0.5 mg p.o. b.i.d. and 1 mg p.o. q.h.s.  They also      recommended weaning off the Klonopin as an outpatient, which I will      gladly do as an outpatient.  6. History of DVTs and Pes.  The patient was continued on Coumadin at 10      mg a day.  This was done because she was started on fluoroquinolone.      She has made an appointment for blood work on August 04, 2006 for lab      work, at which time, she will check her INR and we will likely increase      her Coumadin back to her previous home regimen.  7. Diabetes mellitus.  She did have some elevated blood sugars.  We kept      her on Lantus and a sliding scale while she was an inpatient.  We also      continued her home medications, including Actos 45 mg p.o. daily,      metformin 1,000 p.o. b.i.d. and Amaryl.  She will continue with these      as an outpatient at discharge.  We did not feel she is a good Lantus      candidate given the fact she lives alone and  often self medicates      whenever she feels any symptoms and we think she can potentially cause      herself permanent harm with insulin.  Therefore, we will continue her      present oral medications.   DISCHARGE MEDICATIONS:  Have not changed.  They include:  1. Coumadin 10 mg p.o. daily.  2. Prilosec 20 mg p.o. daily.  3. Ultram 50 mg tablet p.o. q.6 hours.  4. Iron sulfate 325 mg p.o. b.i.d.  5. Topamax 200 mg p.o. q.h.s.  6. Enalapril 40 mg p.o. daily.  7. Trazodone 200 mg at bedtime.  8. Lasix 20 mg p.o. daily.  9. Vytorin 10/40 p.o. daily.  10.Haldol 0.5 mg one p.o. b.i.d. 2 tablets p.o. q.h.s.  11.Flonase 2 sprays each nostril daily.  12.Actos 45 mg one tablet q.a.m.  13.Amaryl 4 mg two tablets p.o. q.a.c.  14.Effexor 75 mg three capsules p.o. daily.  15.Singulair 10 mg p.o. daily.  16.Metformin 1,000 mg p.o. b.i.d.   DISCHARGE APPOINTMENTS:  Patient is to call [facility] at [phone] and schedule an appointment with Dr. Tanya Nones in the next  1 to 2 weeks.  She also instructed to followup with [facility] on August 17, 2006 at 8:45 a.m. and the phone number to that office is given to the  patient so that she can call and arrange the appointment.      Broadus John T. Kristen Leyden, MD     WTP/MEDQ  D:  07/31/2006  T:  07/31/2006  Job:  [id number]

## 2011-05-07 NOTE — Discharge Summary (Signed)
[name], [name]                ACCOUNT NO.:  [id number]   MEDICAL RECORD NO.:  [id number]          PATIENT TYPE:  INP   LOCATION:                               FACILITY:  [facility]   PHYSICIAN:  Johney Maine, M.D.   DATE OF BIRTH:  [birth year]   DATE OF ADMISSION:  DATE OF DISCHARGE:                                 DISCHARGE SUMMARY   ADDENDUM:  Please note that during this patient's hospital stay, she was  assessed for SNF placement, nursing home placement.  It was determined that  she qualified for a level II PASARR placement and was pending placement at  the time of her psychiatric evaluation.  The goal is to have her be admitted  to [facility] for voluntary psychiatric evaluation prior to her being  placed in a skilled nursing facility at a level II PSARR .   Also, again to go over medications:  Please make sure at [facility]  she stays on:  1. Enalapril 40 mg p.o. daily.  2. Vytorin 1 tab p.o. daily.  3. Amaryl 8 mg p.o. daily.  4. Lasix 20 mg p.o. daily.  5. Actos 45 mg p.o. daily.  These medications have been previously dictated.  I just wanted to make sure  she stays on her hypertensive and diabetes medications at her Behavioral  stay.   Thank you so much for your help __________.           ______________________________  Johney Maine, M.D.     JT/MEDQ  D:  08/16/2006  T:  08/16/2006  Job:  [id number]   cc:   Broadus John T. Pamalee Leyden, MD  Antonietta Breach, M.D.  Leighton Roach McDiarmid, M.D.

## 2011-05-07 NOTE — Discharge Summary (Signed)
[name], [name]                ACCOUNT NO.:  [id number]   MEDICAL RECORD NO.:  [id number]          PATIENT TYPE:  INP   LOCATION:  5708                         FACILITY:  [facility]   PHYSICIAN:  Johney Maine, M.D.   DATE OF BIRTH:  1941/03/18   DATE OF ADMISSION:  08/10/2006  DATE OF DISCHARGE:  08/18/2006                                 DISCHARGE SUMMARY   ADDENDUM:  Please note that the patient asked to have oxygen at behavioral  health.  Patient was discharged today, August 18, 2006, to [facility] facility.   This patient was voluntarily committed to the behavioral health center due  to her emotional liability.  In fact, in the past few days she has increased  with more rapid mood swings and patient has been talking to herself and  having inconsolable crying.  The patient refused her admission yesterday due  to the fact that she did not have any clothes.  However, clinical social  worker was kind enough to provide clothes for this patient.  The patient  agreed to go to behavioral health and was discharged today, August 18, 2006,  to [facility].   No additions to her medications.  Discharge summary has previously been  dictated with medications and problem list.           ______________________________  Johney Maine, M.D.     JT/MEDQ  D:  08/18/2006  T:  08/18/2006  Job:  [id number]   cc:   Antonietta Breach, M.D.  Leighton Roach McDiarmid, M.D.  Broadus John T. Pamalee Leyden, MD

## 2011-05-07 NOTE — Consult Note (Signed)
NAMETAYTEM, GHATTAS                ACCOUNT NO.:  [id number]   MEDICAL RECORD NO.:  [id number]          PATIENT TYPE:  INP   LOCATION:  3737                         FACILITY:  [facility]   PHYSICIAN:  Antonietta Breach, M.D.  DATE OF BIRTH:  Sep 12, 1941   DATE OF CONSULTATION:  07/28/2006  DATE OF DISCHARGE:                                   CONSULTATION   REFERRING PHYSICIAN:  Pearlean Brownie, M.D.   REASON FOR CONSULTATION:  Agitation and psychosis.   [name] is a 70 year old female admitted to the [facility] on July 22, 2006.  She has been seeing a local psychiatrist.  She  has a history of using multiple doctors for multiple controlled substances.  She states that she has not been abusing her Klonopin.  She has been very  difficult to redirect by the staff over her hospital course.  She has  pressured speech and insomnia, as well as agitation and tangential thought  process.   She has been suffering from urosepsis and was actually found unresponsive  prior to admission.   PAST PSYCHIATRIC HISTORY:  The patient has multiple episodes of periods of  expansive mood, racing thoughts, tangential thought process, pressured  speech disrupting her social functioning.  Her mood episodes have also  precipitated a number of psychiatric admissions including [facility] [place] in 1998 as well as [facility] in 2001.   Past psychotropic trials have not involved lithium or Tegretol.  The patient  states she gained weight on Seroquel.  She also has been tried on Depakote.   FAMILY PSYCHIATRIC HISTORY:  None known.   SOCIAL HISTORY:  Occupation:  On disability.  The patient lives by herself  in an apartment in [place].  She also has one sister in [place].  Marital status:  Divorced 3 times.   The patient has a history of buying benzodiazepines off the street.  She  also has a history of inpatient detoxification for benzodiazepines.   She  also has been known to escalate her self-dosing of opiates that have been  prescribed for pain.  She has gone to multiple pharmacies in the past for  prescribed opiates.   The patient also has a history of abusing over-the-counter stimulants.   GENERAL MEDICAL PROBLEMS:  Diabetes type 2, hyperlipidemia, COPD,  hypertension.   MEDICATIONS:  The MAR is reviewed.  Psychotropics, at this time, include  Klonopin 2 mg t.i.d., Neurontin 400 mg t.i.d., Topamax 200 mg q.h.s.,  Desyrel 200 mg q.h.s., Effexor 225 mg q.h.s.   LABORATORY DATA:  CBC is remarkable for a slight decreased hemoglobin at  11.8, which is increased from 8.1 after the patient had a transfusion  yesterday.  INR is 1.6.  Complete metabolic panel is unremarkable.  Her SGOT  was 53, SGPT 26.  TSH is within normal limits at 1.8.  Urine drug screen was  positive for benzodiazepines and amphetamines.   CT of the head, without contrast on July 22, 2006, showed mild atrophy  without acute findings.   REVIEW OF SYSTEMS:  CONSTITUTIONAL:  Afebrile.  EYES:  There is a history of  cataracts.  EARS:  No hearing changes.  NOSE:  No rhinorrhea.  MOUTH/THROAT:  No sore throat.  NEUROLOGIC:  Unremarkable.  PSYCHIATRIC:  As above.  The  patient's trazodone was increased to 600 mg q.h.s. as an outpatient.  Also,  she has recently been on much higher doses of Topamax as well as Neurontin.  It is unclear as to whether the patient is taking these appropriately.  GASTROINTESTINAL:  There is a history of cholecystectomy.  GENITOURINARY:  There is a history of hysterectomy.  SKIN:  Unremarkable.  ENDOCRINE/METABOLIC:  As above.  HEMATOLOGIC/LYMPHATIC:  As above.   PHYSICAL EXAMINATION:  VITAL SIGNS:  Temperature 98.2, pulse 100,  respirations 20, blood pressure 129/87, O2 saturation on room air 94%.  MENTAL STATUS EXAM:  [name] is alert.  She has agitated and expansive  affect.  Her speech is pressured.  She is pacing up and down  the hallway,  but is re-directable by the nurses.  She is not combative or agitated.  She  has increased speed on thought process as well as tangentiality.  Thought  content is delusional.  No thoughts of harming.  No thoughts of harming  others.  She is oriented to the year, the month, the day of the month, the  day of the week, time of day, place, and person.  Her insight is poor.  Her  judgment is impaired.  Her memory is grossly intact to immediate, recent,  and remote, although she is a difficult historian at this time.  Her mood is  labile.   ASSESSMENT:  AXIS I:  1. 293.83, mood disorder, not otherwise specified (functional and organic      components).  2. Bipolar I disorder, manic.  3. Anxiety disorder, not otherwise specified.  4. Polysubstance dependence.  AXIS II:  Deferred.  AXIS III:  See general medical problems.  AXIS IV:  General medical and primary support group.  AXIS V:  30.   RECOMMENDATIONS:  1. Recommend a regimen of Haldol, standing, for anti-psychosis starting      with 0.5 mg b.i.d. and 1 mg q.h.s.  The advocacy of this regimen with      depend up on keeping it in place over a number of days and it can be      adjusted accordingly.  2. The patient is not willing to take atypical antipsychotics, nor is she      willing to take some other standard mood stabilizers.  She obsesses on      information shared on TV commercials.  We will continue with Topamax      200 mg q.h.s. as a mood stabilizer.  Also, for anti-insomnia, Desyrel      will be continued at 200 mg q.h.s.  Effexor will be continued for      antidepressant, anxiety depression without altering the dose if      possible during her acute episode.  The current does of Effexor is 225      mg q.h.s.  3. Would utilize Ativan 1 to 2 mg q.6 to 8 hours p.r.n. agitation.  If the      agitation is severe, or if combativeness is involved, would utilize     Haldol 2 mg with Ativan 2 mg p.o. IM or slow push IV  q.4 hours p.r.n.  4. We will follow this patient during her general medical course.  Her  psychiatric disposition will depend on the progress of her symptoms as      she recovers from her general medical acute problems.      Antonietta Breach, M.D.  Electronically Signed    JW/MEDQ  D:  07/28/2006  T:  07/29/2006  Job:  [id number]

## 2011-05-07 NOTE — Discharge Summary (Signed)
[name], [name]                          ACCOUNT NO.:  [id number]   MEDICAL RECORD NO.:  [id number]                   PATIENT TYPE:  INP   LOCATION:  0469                                 FACILITY:  [facility]   PHYSICIAN:  Corinna L. Lendell Caprice, MD             DATE OF BIRTH:  05-09-1941   DATE OF ADMISSION:  01/16/2004  DATE OF DISCHARGE:  01/23/2004                                 DISCHARGE SUMMARY   DIAGNOSES:  1. Chronic obstructive pulmonary disease exacerbation and right-sided     pneumonia.  2. History of deep venous thrombosis.  3. Left foot pain.  Multiple x-rays negative for fracture.  4. Hyperlipidemia.  5. Hypertension.  6. History of depression and anxiety disorder.  7. Tobacco abuse.   DISCHARGE MEDICATIONS:  1. Amaryl 4 mg p.o. daily.  2. Klonopin 2 mg p.o. q.i.d.  3. Lipitor 40 mg p.o. q.h.s.  4. Neurontin 300 mg p.o. t.i.d.  5. Nexium 40 mg p.o. daily.  6. Combivent MDI two puffs q.i.d.  7. Advair 250/50 mg one puff b.i.d.  8. Aspirin 81 mg p.o. daily.  9. Percocet one p.o. t.i.d. as needed for pain or Tylenol 650 mg p.o. q.4h.     as needed for pain.  10.      Ativan 1 mg p.o. q.4h. as needed for anxiety.  11.      Effexor XR 300 mg p.o. daily.  12.      Singulair 10 mg p.o. daily.  13.      Trazodone 200 mg p.o. q.h.s.  14.      Seroquel 400 mg p.o. q.h.s.  15.      Lasix 40 mg p.o. daily.  16.      Metformin 1000 mg p.o. b.i.d.   FOLLOWUP:  Follow up with her primary care physician and psychiatrist in a  month.  Follow up with her podiatrist as needed for foot pain.   CONDITION ON DISCHARGE:  Stable.   ACTIVITY:  Ad lib.   DIET:  Diabetic, low salt, low cholesterol.   PERTINENT LABORATORIES:  UA negative for nitrites or leukocyte esterase.  LDL 95, HDL 40, triglycerides 141.  CPK-MB and troponin negative.  Basic  metabolic panel significant for a glucose of 271.  CBC significant for a  hemoglobin of 10.5, hematocrit 33, white count normal, and  platelet count  normal.   SPECIAL STUDIES AND RADIOLOGY:  Chest x-ray showed nothing acute.  CT scan  of the chest and legs showed no DVT, no evidence of pulmonary embolus, new  area of infiltrate within the right lung apex, parenchymal scarring at the  right lower lobe, and right hilar and mediastinal adenopathy.  Needs a  repeat CT scan in four to six months.   HISTORY AND HOSPITAL COURSE:  [name] is a 70 year old, white, female  smoker with COPD and severe anxiety, who presented to the hospital with  complaints of sudden shortness of breath.  She had oxygen saturations of 93%  on 3 L.  Apparently she uses oxygen as needed at home.  She had bilateral  wheezing.  She was ruled out for pulmonary embolus and started on steroids,  antibiotics, hand-held nebulizers, and oxygen.  Her shortness of breath and  wheezing improved and at the time of discharge, she was ambulating without  oxygen, without wheezing, and without complaints of shortness of breath.  Her hospitalization was significant for hyperglycemia due to the steroids  and she was covered with sliding scale insulin.  This has improved with  tapering the steroids.  At the time of discharge, she was only on 5 mg of  prednisone and this will not need to be continued further.   Also during her hospitalization, the patient had various complaints, one of  which was constant left foot pain.  She reported that she had a procedure  done by her podiatrist before Thanksgiving and since then she has had  continued foot pain.  X-rays done on previous admissions showed no fracture.  She was given Percocet as needed for pain.   Also during her hospitalization, she had a lot of problems with anxiety and  was given Ativan as needed.  Also, psychiatry was consulted and felt that  the patient was competent and that her medications should be continued.  Her  other medical problems remained stable during her hospitalization.                                                Corinna L. Lendell Caprice, MD    CLS/MEDQ  D:  01/23/2004  T:  01/23/2004  Job:  [id number]

## 2011-05-07 NOTE — Discharge Summary (Signed)
[name], [name]                ACCOUNT NO.:  [id number]   MEDICAL RECORD NO.:  [id number]          PATIENT TYPE:  INP   LOCATION:  5708                         FACILITY:  [facility]   PHYSICIAN:  Leighton Roach McDiarmid, M.D.DATE OF BIRTH:  [birth year]   DATE OF ADMISSION:  08/10/2006  DATE OF DISCHARGE:  08/16/2006                                 DISCHARGE SUMMARY   Please note, this is [facility]  [facility].   ADMISSION DIAGNOSES:  1. Pain/weakness.  2. Lethargy.  3. Diabetes type 2.  4. Chronic obstructive pulmonary disease.  5. History of deep vein thrombosis.  6. Hypertension.  7. Bipolar disorder/depression.   DISCHARGE DIAGNOSES:  1. Mood disorder, depression.  2. Benzodiazepine dependence.  3. Hypertension.  4. Diabetes type 2.  5. Chronic obstructive pulmonary disease.  6. History of deep vein thrombosis/anticoagulation.   BRIEF HISTORY AND PHYSICAL:  This is a 70 year old female who was admitted  after almost daily visits to the emergency room since her discharge in mid-  August 2007.  She repeatedly came to the emergency room for complaints of  shortness of breath, questionable suicidal ideation and frequent falls and  generalized weakness.  She was admitted on the 22nd for signs and symptoms  of weakness with multiple bruises, also signs and symptoms of lethargy,  questionable medication overdose.   DISCHARGE MEDICATIONS:  This patient will be discharged on her current  hospital regimen of:   1. Advair 250/50 Diskus one puff b.i.d.  2. Protonix 20 mg p.o. daily.  3. Ferrous sulfate 325 mg p.o. b.i.d.  4. Enalapril 40 mg p.o. daily.  5. Trazodone 200 mg p.o. q.h.s.  6. Lasix 20 mg p.o. daily.  7. Vytorin one tablet p.o. daily.  8. Actos 45 mg p.o. daily.  9. Amaryl 8 mg p.o. daily at breakfast.  10.Glucophage 1000 mg p.o. b.i.d.  11.Coumadin is being dosed per pharmacy currently.  12.Topamax 150 mg q.h.s.  13.Tylenol  650 mg p.o. q.6h.  14.Atrovent two puffs inhaled q.i.d.  15.Tramadol 50 mg p.o. q.6h. p.r.n.  16.Depakote 250 mg p.o. b.i.d.  17.Haldol 0.5 mg p.o. t.i.d.  18.Haldol 1 mg p.o. b.i.d. p.r.n. acute anxiety.  19.Effexor dose is currently 225 mg p.o. daily; however, per psychiatry      recommendations this may be changed to 150 mg p.o. daily.  This can be      changed once the patient is hospitalized at inpatient to [facility]      Medicine.   HOSPITAL COURSE:  Problem 1.  WEAKNESS:  This patient did well once she was admitted to the  hospital, and physical exam was normal with 5/5 strength on initial  assessment.  Except for bruises there appeared to be no extremity injuries  or problems.  The patient was admitted for some observation, possible  deconditioning secondary to psychiatric issues.  There were some problems  with her taking her medications and also over-self medicating herself and  under-self medicating herself.  Her weakness improved when she was admitted  and properly was taking her medications.  She  had no falls during this  hospital course.   Problem 2.  LETHARGY:  This also improved.  In fact, the patient has not  been sleeping.  Improvement was with control of her medications.   Problem 3.  DIABETES TYPE 2:  We continued her home medications and she had  fairly good glucose control.   Problem 4.  BIPOLAR/DEPRESSION:  We believe that the patient's main problem  is her psychiatric illnesses, which is attributed in her frequent emergency  room visits.  While she was here she was very anxious with extreme labile  emotion, becoming teary and crying throughout most of our daily interviews.  She also did not sleep.  She appeared to be mildly manic and was unable to  concentrate very well.  We consulted psychiatry.  Dr. Jeanie Sewer was kind  enough to assess the patient and recommended, among many medication  recommendations, a voluntary inpatient hospitalization at [facility].  The patient agreed to this hospitalization, which will help with, among many  things, benzodiazepine dependency, hopefully weaning her off some of her  benzodiazepines.  It will also help monitor some of her psychiatry  medications and started her on a mood stabilizer, Depakote, that will be  managed in-house.  We appreciate psychiatry's consult.  Medications were  changed.  Haldol was decreased to 0.5 mg t.i.d.  Effexor will be decreased  to 150 mg as previously stated.  Depakote was started at 250 mg prior to  this transfer.   Problem 5.  HISTORY OF DEEP VEIN THROMBOSIS:  The patient has been on  Coumadin to prevent DVT and pulmonary embolism.  This patient due to her  poor compliance has had INRs ranging from admitting INR elevated at 4.1 on  admission.  The patient is not overly compliant with her Coumadin.  The  patient's daily regimen was 10 mg p.o. daily.  However, we believe that she  was noncompliance with this regimen.  Once admitted, we held her Coumadin  initially and followed her INRs, which steadily improved.  Pharmacy was  consulted to dose the Coumadin and they followed daily and began Coumadin at  10 mg daily.  However, once the Coumadin was held the INR dropped to 2.7.  Coumadin was restarted at 10 mg and dosed daily per pharmacy.  On day of  discharge her INR is 3.  Pharmacy is still following her.   Problem 6.  HYPERTENSION:  The patient was continued on her home  medications.  The blood pressures were well-controlled.   Problem 7.  CHRONIC OBSTRUCTIVE PULMONARY DISEASE:  The patient was  continued on her home medications; however, on the day of discharge we  discontinued her albuterol p.r.n.  The patient did have tachycardia during  her hospital stay.  It is believed that the albuterol was attributing to  this tachycardia.  The patient was discharged to psychiatry with Advair  Diskus and Atrovent inhaler q.i.d.  Problem 8.  TACHYCARDIA:  The patient was  noted to have persistent  tachycardia throughout her hospital stay.  All labs, which I will mention  briefly in a little bit, were normal.  There was sinus tachycardia on EKG.  BNP was within normal limits.  The patient on previous admission had a TSH  which was normal.  All other workup was negative.  Once again as  aforementioned, we believe that the tachycardia might be related to  albuterol inhaler.  Also in the differential, agitation and anxiety and  pulmonary  embolism.  We do not believe this person has a pulmonary embolism  given that she has been anticoagulated.  Also, her oxygen levels have  maintained throughout her hospital stay.  Chest x-ray was improved from  prior.   PERTINENT LABS:  Besides the labs already discussed, the patient's white  blood cell count remained normal throughout her hospital stay.  Hemoglobin  was low at 9.9 on admission.  On day of discharge hemoglobin was 10.5. BNP  less than 30, which is normal.  Her BMET:  Sodium, potassium, chloride, CO2  and BUN and creatinine were normal, creatinine 0.6, calcium was 8.9.   IMAGING:  Chest x-ray was notable on admission for cardiomegaly and small  pleural effusions.  Unofficial read on the x-ray done today, August 28,  shows a small bilateral atelectasis.   This patient is going to be discharged to [facility] for a voluntary  psychiatric hospitalization.  She is medically stable from the family  medicine standpoint.  She will need to have pharmacy at [facility]  follow her INR and help dose her Coumadin.  [name] studies have shown  that INRs of 1.5-2.0 have been known to reduce the incidence of DVTs.  Pharmacy can help decide whether the patient needs to have an INR of 1.5-2  or more therapeutic at 2-3.     ______________________________  Johney Maine, M.D.    ______________________________  Leighton Roach McDiarmid, M.D.    JT/MEDQ  D:  08/16/2006  T:  08/16/2006  Job:  [id number]   cc:    Antonietta Breach, M.D.  Broadus John T. Pamalee Leyden, MD

## 2011-05-07 NOTE — Discharge Summary (Signed)
[name], [name]                          ACCOUNT NO.:  [id number]   MEDICAL RECORD NO.:  [id number]                   PATIENT TYPE:  INP   LOCATION:  4706                                 FACILITY:  [facility]   PHYSICIAN:  Maylon Peppers. Waynette Buttery, M.D.               DATE OF BIRTH:  1941/01/15   DATE OF ADMISSION:  09/23/2002  DATE OF DISCHARGE:  09/24/2002                                 DISCHARGE SUMMARY   DISCHARGE DIAGNOSES:  1. Chest pain.  2. Hypertension.  3. Hyperlipidemia.  4. Depression.  5. Anxiety.  6. Asthma/chronic obstructive pulmonary disease.  7. Diabetes mellitus.   DISCHARGE MEDICATIONS:  1. Albuterol.  2. Altace.  3. Amaryl.  4. Atrovent.  5. Effexor.  6. Flovent.  7. Klonopin.  8. Lasix.  9. Lipitor.  10.      Neurontin.  11.      Nexium.  12.      Trazodone.  13.      Singulair.   DIET:  The patient was discharged with instructions to adhere to an ADA  diet.   FOLLOW-UP:  Discharged with an appointment to see Kevin Fenton, M.D., on  October 02, 2002, at 1:30 p.m. at the family practice center.   HISTORY OF PRESENT ILLNESS:  The patient is a 70 year old female who  presented secondary to chest pain in church.  Please see the admission H&P  for details and admission physical.   LABORATORY DATA:  Labs on admission included a white count of 7.5,  hemoglobin 12.4, hematocrit 38.2, and platelet count 259.  Sodium 140,  potassium 3.8, chloride 102, bicarbonate 31, BUN 15, creatinine 0.6, glucose  55, AST 24, ALT 26, alkaline phosphatase 58, total bilirubin 0.4, protein 6,  albumin 3.4.  The EKG showed prolonged QT interval, normal sinus rhythm, and  nonspecific T-wave changes.  The chest x-ray showed no acute disease.  The  CK was 244, MB fraction 6.7, and troponin I 0.03.   HOSPITAL COURSE:  #1 - CHEST PAIN:  Enzymes were mildly elevated.  They did  not bump over the course of admission.  The troponin I remained negative  throughout.  Therefore the  patient was considered to be ruled out by serial  enzymes and EKGs.  Although the patient has risk factors including age,  diabetes, hypertension, hyperlipidemia, and family history, the pain that  the patient had was very unlikely to be cardiac.  Therefore, I would not  recommend risk stratifying her at this point.  However, if she does have  repeated symptoms, it would be a prudent road to travel.   #2 - HYPERTENSION:  The patient is well controlled on Altace and Lasix.   #3 - HYPERLIPIDEMIA:  The patient is currently controlled on Lipitor.   #4 - DEPRESSION:  This is followed by [name], M.D., and was not  an issue during this hospitalization.   #  5 - ANXIETY:  It is highly possible that the patient's chest pain is all  due to anxiety and it is recommended that she follow up with [name], M.D., in the near future.   #6 - ASTHMA/CHRONIC OBSTRUCTIVE PULMONARY DISEASE:  The patient was well  controlled on her home medications.   #7 - DIABETES MELLITUS:  The patient is to continue Amaryl as an outpatient.                                               Maylon Peppers Waynette Buttery, M.D.    SAG/MEDQ  D:  09/24/2002  T:  09/26/2002  Job:  [id number]   cc:   Kevin Fenton, M.D.  [facility]  [place]  [place] [address]  Fax: [phone]   [name], M.D.  [address]  [place]  [place] [address]  Fax: [phone]

## 2011-05-07 NOTE — Discharge Summary (Signed)
[facility]  Patient:    [name], [name]                       MRN: [id number] Adm. Date:  [id number] Disc. Date: [id number] Attending:  Garnette Scheuermann Dictator:   Melba Coon, M.D.                           Discharge Summary  DISCHARGE DIAGNOSES: 1. Left lower lobe pneumonia. 2. Type 2 diabetes. 3. Chronic obstructive pulmonary disease. 4. Hypertension. 5. Hyperlipidemia. 6. Drug-seeking behavior.  DISCHARGE MEDICATIONS:  1. Albuterol two puffs p.r.n.  2. Allegra one tablet p.o. q.d.  3. Altace 10 mg p.o. q.d.  4. Amaryl 4 mg one tablet p.o. q.d.  5. Flovent two puffs b.i.d.  6. K-Dur 20 mEq one tablet p.o. q.d.  7. Lasix 40 mg one tablet p.o. q.d.  8. Lipitor 10 mg one tablet p.o. q.d.  9. Singulair 10 mg one tablet p.o. q.d. 10. Effexor XR 150 mg one tablet p.o. q.d. 11. Doxepin 75 mg three tablets q.h.s. 12. Tequin 400 mg one tablet p.o. q.d. x 9 days.  SUMMARY:  Please see the admission H&P for more details and information. Briefly, this is a 70 year old white female with the above problem list, who presented at the [facility] Emergency Department on the evening of November 05, 2000, feeling malaise, shortness of breath, and a nonproductive cough.  The patient had a fever to 104 degrees with nausea and vomiting x 3 days.  She states that her oral intact was decreased as well.  LABORATORIES ON ADMISSION:  She was found to have a white blood cell count of 22.5, a hemoglobin of 11.5, a hematocrit of 32.8, platelets 230, and 91% neutrophils.  Sodium 128, potassium 3.3, chloride 99, bicarbonate 23, BUN 9, creatinine 0.7, glucose 259, calcium 8.2, AST 23, ALT 19, total bilirubin 0.7.  The chest x-ray revealed left lower lobe pneumonia.  HOSPITAL COURSE:  The patient was admitted to the family practice service for further treatment.  #1 - PNEUMONIA:  The patient was started on Tequin 400 mg p.o.  q.d.  The patient was also given IV fluids to help with dehydration issues secondary to her nausea and vomiting.  The patient remained afebrile during her hospital stay and had a temperature of 97.7 degrees at discharge.  She also continued to saturate well on room air.  She will be discharged home with a prescription for Tequin to continue it for nine more days.  #2 - TYPE 2 DIABETES:  The patients sugars have been very well controlled during her hospitalization and she was continued on her Amaryl here.  #3 - CHRONIC OBSTRUCTIVE PULMONARY DISEASE:  The patient was given some breathing treatments with good success.  She will be continued on her albuterol, her Flovent, and her Singulair on discharge.  #4 - HYPERTENSION:  The patients blood pressure ranged anywhere from 93-119/46-64.  She will be discharged on her ACE inhibitor.  Her BUN and creatinine remained within normal limits.  #5 - HYPERLIPIDEMIA:  The patient will be continued on her Lipitor as she was taking at home.  #6 - DRUG-SEEKING BEHAVIOR:  We talked to the family, who is very concerned about her behavior over the past two months of buying Vicodin and Xanax on the street.  She was seen by Daine Floras, M.D., at behavioral health,  who prescribed her Effexor and Doxepin, but no other medication at that time. They are very concerned with this and as such, she will be following up first thing tomorrow morning prior to discharge with Daine Floras, M.D.  DISCHARGE CONDITION:  Good.  DISPOSITION:  Discharged to home with her daughter.  ACTIVITY:  The patient was given no activity restrictions.  DIET:  She is to continue following a diabetic diet.  SPECIAL INSTRUCTIONS:  We also discussed only taking medications prescribed by a physician.  FOLLOW-UP:  The patient will follow up with Daine Floras, M.D., on Tuesday, November 08, 2000, at 10:30 a.m.  Ms. Kattner daughter will be calling for an appointment  with Andrey Spearman, M.D., within one to two weeks.  The patient and her daughter voiced agreement and understand of the above plan and had no further questions. DD:  11/07/00 TD:  11/08/00 Job: [id number]
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
## 2011-05-07 NOTE — Discharge Summary (Signed)
[name], [name]                ACCOUNT NO.:  [id number]   MEDICAL RECORD NO.:  [id number]          PATIENT TYPE:  INP   LOCATION:  3737                         FACILITY:  [facility]   PHYSICIAN:  Johney Maine, M.D.   DATE OF BIRTH:  09-May-1941   DATE OF ADMISSION:  07/22/2006  DATE OF DISCHARGE:                                 DISCHARGE SUMMARY   ADMITTING DIAGNOSES:  1. Unresponsive.  2. Possible Klonopin overdose.  3. Chronic obstructive pulmonary disease.  4. Diabetes type 2.  5. Bipolar.  6. Depression.  7. History of deep venous thrombosis.  8. Hypertension.  9. Hyperlipidemia.  10.Anemia.   HISTORY OF PRESENT ILLNESS:  This patient is a 70 year old female who was  found unresponsive in her home.  She has a lengthy medical history including  many problems with over self-medicating herself with her prescription  medications such as Klonopin and trazodone.  It was unknown why the patient  was found unresponsive.  She had been given medicines for presumed urosepsis  recently, with a positive UA and a positive culture.  The patient has been  to the emergency room multiple times over the past few months, most recently  for shortness of breath related to anxiety.   Once the patient was admitted to the hospital, she had a full workup for her  unresponsiveness.  Her head CT was negative.  Chest x-ray had no acute  findings.  Given the recent treatment of urosepsis that was high on the  differential, we began IV antibiotic of Cipro to treat her.  We also worked  the patient up for acute myocardial infarction.  Cardiac enzymes were  negative x3 and her EKG was within normal limits.  The patient has a history  of multiple PE, so we had to rule that out as a possible cause.  She is  therapeutic on her Coumadin, however, at this time.  The patient did awake  and was able to move all extremities in response eval in the emergency room.  She was alert and oriented x2.  Once we admitted  her, we continued her  antibiotics, continued to monitor her mental status.  The patient did not  need to be intubated at this time.  The initial urine drug screen that was  performed in the emergency room was positive for benzodiazepines which we  have prescribed and also positive for amphetamines which were not prescribed  by any physician.  The patient denies using amphetamines, denying over the  counter medications that contain amphetamine.  This patient continued to  improve while in the hospital.  She was alert and oriented, breathing fine,  was saturating well on oxygen.  The initial labs will be discussed later in  this dictation.   The patient was therapeutic, INR of 2.5 on admission.  However, her  hemoglobin had dropped down to 8.2.  There were no signs of active bleeding  and was Hemoccult negative, so unsure at this time of what caused this  significant drop.  The patient did well over the course.  Her mental status  improved.  She is alert and oriented; in fact, even became very anxious and  was somewhat agitated.  She does not remember what happened.  She said she  felt like she was sweaty before she passed out, although she is a poor  historian.  She denied slurred speech.  It was unknown what caused her  unresponsiveness.  However, we did do a full workup to explore the  possibility of syncope or cardiogenic origin of this loss of consciousness.  The full workup included an echocardiogram which was within normal limits.  Cardiology was consulted for this and they performed Dopplers of the carotid  arteries which did not show any sign of ICA stenosis.  They did this because  they felt they auscultated a bruit.  However, this was not the cause of her  syncope.  A Cardiolite stress test is pending at this moment.  An addendum  on discharge dictation will be made regarding that.  The cardiology workup  had, like I stated, been negative so far.   Problem with UTI:  The  patient completed a full dose of Cipro during her  hospital course and improved.  She has no symptoms at this time.   Problem with COPD:  The patient continued to be on her Advair, Atrovent and  albuterol inhalers along with oxygen to keep her sats above 92%, which she  has.  She did have some crackles initially and at various times during her  hospital stay.  Because of that, we continued to give her Lasix p.o. daily,  at two times giving her an extra dose to clear her edema.   Hypertension has been stable throughout this time.  In fact, there were a  couple of low blood pressures that we followed.  The patient normalized  without any change in medication.   Patient's anemia:  Throughout the hospitalization, we were concerned that  her anemia was very low.  It is an iron-deficiency anemia with a low MCV.  We began her on iron b.i.d.  She was Hemoccult negative during her stay for  the second time.  We did make an outpatient appointment with GI.  This  patient does need a colonoscopy in the very near future, and possibly an EGD  to determine if she is bleeding from the GI tract.   Anxiety and bipolar:  The patient was very agitated and almost manic during  her hospital stay.  She slept very little.  Did not hallucinate, although  she persevered on many subjects such as pain, was constantly pacing the  hallways.  She never became combative, however.  We continued her home dose  regimen.  Her psychiatry medications will be discussed later.   History of DVT:  [name] INR bumped to 3.5.  We held her Coumadin for  a day and restarted her on a lower than home dose at 10 mg p.o. daily.  We  were not concerned at any point for DVT during this hospital stay.  The  patient remained therapeutic or subtherapeutic throughout her stay.   This patient did have rhabdomyolysis secondary to being found down and loss of consciousness.  Her CKs were elevated initially.  We gave her IV fluids  and  this normalized.  Over the course of her hospital stay, patient  continued to improved.  Urinary symptoms improved. COPD was stable.  Her  main complaint was numbness of the head which we were unable to find any  cause for; most  likely it is somatization related to her psychiatric  disorders.   The patient received two units of packed red blood cells on July 26, 2006,  to increase her hemoglobin.  Cardiology would not perform a stress test with  her hemoglobin that low.  The patient responded well to the two units of  packed red blood cells, going from a hemoglobin of 7.9 to 11.1 after the  transfusion.  Once again, she will be worked up as an outpatient for her  anemia.   Over the course, patient clinically responded well.  Mental status was  within normal limits.  We continued her home medication regimen.   With her diabetes, however, she had some elevated blood sugars.  We had her  on Lantus and sliding scale insulin while in-patient.  We do not want to  send her home on this regimen because the patient tends to self-medicate  herself and we fear that she will have episodes of hypoglycemia if she self-  medicates herself with insulin.  Therefore, she will be going home on her  home medications which will be discussed later in this dictation.   Psychiatry was consulted on July 28, 2006, because the patient continued to  be agitated and very anxious.  Psychiatry did not think this patient needed  to be admitted to in-patient service.  However, they did change her  medications including giving her Haldol 0.5 b.i.d. and 1 mg in the evening,  and they decreased her Effexor dose.  They were concerned with this  patient's pharmacy shop giving various benzodiazepine prescriptions at  different pharmacies.  Due to this, we spoke with the pharmacy that she uses  for all medications and sent the prescriptions directly to the pharmacy.  Overall, psychiatry felt this patient needs additional  medications.  However, they were hesitant to put her on Lithium to control her bipolar  because of her tendency to self medicate. They felt the patient was stable  at this time.   DISCHARGE MEDICATIONS:  This patient was discharged on the following  medications which were faxed directly to [facility]  1. Coumadin 10 mg p.o. daily.  2. Prilosec 20 mg p.o. daily.  3. Ultram 50 mg 1 tablet p.o. every 6 hours p.r.n. pain.  4. Iron sulfate 325 mg 1 tablet p.o. b.i.d.  5. Topamax 200 mg 1 tablet p.o. at bedtime.  6. Enalapril 20 mg 2 tablets p.o. daily.  7. Trazodone 100 mg 2 tablets p.o. at bedtime.  8. Lasix 20 mg 1 tablet p.o. daily.  9. Vytorin 10/40, 1 tablet p.o. daily.  10.Haldol 0.5 mg.  Take 1 tablet p.o. b.i.d.  Take 2 tablets p.o. at      bedtime.  11.Flonase spray, 2 sprays each nostril daily.  12.Actos 45 mg 1 tablet every morning.  13.Amaryl 4 mg.  Take 2 tablets p.o. q.a.c. 14.Effexor 75 mg 3 capsules p.o. daily.  15.Singulair 10 mg 1 tablet p.o. daily.   DISCHARGE DIAGNOSES:  1. Anemia.  2. Questionable syncope.  3. Depression.  4. Bipolar disorder.  5. Diabetes.  6. Hypertension.  7. Chronic obstructive pulmonary disease.  8. Hyperlipidemia.  9. Polypharmacy.   DATE OF DISCHARGE:  August 11. 2007.  There will be addendum note to make  sure the patient will not be discharged today.  She is pending the results  of her Cardiolite stress test.  Patient will be discharged in improved and  stable condition.   She has an appointment with  [facility] on August 17, 2006, at 8:45 a.m.  She  will follow up with [facility] and Dr. Tanya Nones, her primary care  [facility], as needed.  Patient will call to make an appointment.  She will  also follow up her Coumadin on Thursday, August 04, 2006, to check her  Coumadin level, at 10 a.m.   CONSULTATIONS:  We consulted cardiology and psychiatry during this hospital  stay.   TESTS THAT WERE DONE:  Please note the  Cardiolite stress test results just  came back.  There is a small area of reversibility noted at the apex  extended slightly into the anterior wall, concerning for a small area of  ischemia, ejection fraction of 63%.   Other imaging:  Echocardiogram which is within normal limits, showing  ejection fraction 55% to 60%.  Dopplers of the carotids within normal  limits; bilaterally no significant ICA stenosis, right external carotid  artery stenosis, bilateral vertebral artery flow anterograde.  She had an x-  ray of her elbow which showed no acute fracture or abnormality.  She had a  CT of the head without contrast that showed no acute changes, just mild  degenerative change.  Also CT of the spine which showed no acute problems.  Her chest x-ray:  No acute cardiopulmonary disease.  She had two of these;  neither one showed any cardiopulmonary disease.   PERTINENT LABORATORIES:  This patient initially had hemoglobin of 10.7 which  trended down during her hospital stay.  We have already discussed this.  She  had a slight white count of 11.9.  Her urinalysis showed greater than  100,000 E. coli that was sensitive to Cipro.  Her ABG showed pH 7.233, CO2  45.5, bicarb 19.2.  Over the course, her labs changed.  Her white blood  cells trended down and were normal at discharge. Her bicarb also normalized  at 22 on the next day.  However, her CK was 1558 which is elevated.  It  trended down to 1172 and continued to trend down.  Troponin was 0.02 x3,  which is normal.  [name] labs continued to trend down as for CK.  Her  INR fluctuated some, reaching a high of 3.5.  We held it; now it has been  trending down.  Patient's most recent INR on discharge was 1.6,  subtherapeutic.  However, the patient is not being treated for an active  DVT.  She will be followed at Coumadin clinic as previously stated.  Labs remained within normal limits except for hemoglobin which reached a low of  7.9, and once  again, she was transfused on the day prior to discharge, today  hemoglobin is 10.6.  Patient will be discharged tomorrow.  To follow up with  GI clinic.   Once again, this patient will be discharged on July 30, 2006, in improved  and stable condition with the aforementioned discharge medications and  appointments.   DISCHARGE PHYSICIAN/ATTENDING:  Dr. Charissa Bash           ______________________________  Johney Maine, M.D.     JT/MEDQ  D:  07/29/2006  T:  07/30/2006  Job:  [id number]   cc:   Dr. Gilmore Laroche, patient's PCP  Zenaida Deed. Mayford Knife, M.D.

## 2011-05-07 NOTE — Discharge Summary (Signed)
Kristen, [name]                          ACCOUNT NO.:  [id number]   MEDICAL RECORD NO.:  [id number]                   PATIENT TYPE:  INP   LOCATION:  5021                                 FACILITY:  [facility]   PHYSICIAN:  Pearlean Brownie, M.D.            DATE OF BIRTH:  1941-10-08   DATE OF ADMISSION:  12/02/2003  DATE OF DISCHARGE:  12/03/2003                                 DISCHARGE SUMMARY   DISCHARGE DIAGNOSES:  1. Superficial venous thrombosis of greater saphenous vein.  2. Healthy wound status post bone spur removal.  3. Diabetes mellitus.  4. Hyperlipidemia.  5. Hypertension.  6. Anxiety.  7. Depression.   DISCHARGE MEDICATIONS:  1. Ibuprofen 600 mg p.o. q.6h. p.r.n. pain.  2. Lorcet 5/500 mg 1-2 tablets q.4h. for foot pain if ibuprofen does not     help.  3. Coumadin 5 mg tablets, one-and-a-half tablets on Tuesday and Thursday.     All other days one tablet.  4. Xopenex 1.25 mg nebulizers four times a day.  5. Pulmicort 2 mg nebulizers two times a day.  6. Seroquel 500 mg p.o. b.i.d.; 200 mg in addition at bedtime.  7. Altace 10 mg p.o. daily.  8. Amaryl 4 mg p.o. daily.  9. Aspirin 325 mg p.o. daily.  10.      Atrovent one nebulizer four times a day.  11.      Effexor 300 mg p.o. q.h.s.  12.      Glucophage 1000 mg p.o. b.i.d.  13.      Neurontin 400 mg p.o. t.i.d.  14.      Nexium 40 mg p.o. daily.  15.      Singulair 10 mg p.o. daily.  16.      Trazodone 200 mg p.o. q.h.s.  17.      Librium 25 mg p.o. t.i.d.  18.      Actos 15 mg p.o. daily.   PROCEDURES:  Lower extremity Dopplers:  SVT of greater saphenous vein, no  evidence of Baker's cyst, no evidence of deep vein thrombosis.   CONSULTS:  None.   FOLLOW UP:  1. Follow up appointment with Dr. Waynette Buttery on December 06, 2003 at 2:25 p.m.  2. Lab visit this Friday, December 17, for INR, PT, and PTT check for     Coumadin monitoring.  3. Follow up appointment with Dr. Raynald Kemp, the podiatrist, as  previously     scheduled.   HOSPITAL COURSE BY PROBLEM LIST:  #1.  Superficial venous thrombosis:  [name] is a 71 year old female with history of bone spur removal by Dr.  Raynald Kemp, a podiatrist, two weeks previously.  Beginning one week ago, the  patient began to have increased left calf and thigh pain, warmth and  erythema.  There was a concern that the patient had an infection, and had  been started by Dr. Raynald Kemp on Keflex, and referred to Dr. Tanda Rockers,  as  vascular surgeon.  As an outpatient, Dr. Tanda Rockers had initiated the patient  on Coumadin therapy, again at 5 mg p.o. daily.  The patient did continue to  have pain, and due to the concern of infection, the patient came to the  emergency room.  Initially, there was a concern for infection, and so the  patient was started on Zosyn in the emergency room.  The patient was  continued on this after admission for several hours.  Reevaluation of her  left leg showed no erythema and warmth associated with the patient's wound,  but instead warmth and erythema and pain associated with her calf and thigh.  Lower extremity Dopplers showed superficial venous thrombosis, and no  evidence of deep vein thrombosis.  The patient was taken off of antibiotics  because she had continued to be afebrile and with a normal white count.  She  was continued on Coumadin.  Initially, her INR was 1.6 on 5 mg tablets of  Coumadin for recurrent thrombosis.  This was increased to 7.5 mg on Tuesday  and Thursday, and 5 mg every other day.  [name] INR should be checked  at her next visit this Friday to monitor the changes in Coumadin dose.  The  patient's pain was controlled with ibuprofen scheduled, and Lorcet in  addition for heel pain.  Additionally, the patient should wear TED hose,  elevate her left leg, and use warm compresses applied to her left thigh and  calf to alleviate pain associated with superficial venous thrombosis.  #2.  Healthy wound status  post bone spur removal:  [name] pain and  tenderness was thought to be primarily secondary to #1.  The patient should  follow up with Dr. Raynald Kemp, her podiatrist, as previously scheduled for  further evaluation and maintenance of her healing wound.  #3.  Diabetes mellitus:  The patient was continued on her home medications  of Amaryl, Actos, and Glucophage throughout her hospital stay.  #4.  Chronic obstructive pulmonary disease:  The patient stated that her  shortness of breath and wheeze were within her baseline throughout her  hospital stay.  She was continued on her home doses of Xopenex, Atrovent,  Pulmicort, and Singulair, and remained stable.  #5.  Anxiety and depression:  The patient was continued on her home anxiety  and depressive medications.  It was noted that on admission the patient was  continuing Librium 25 mg three times a day, even though at her last hospital  discharge she had been intended to taper down off of Librium with the help  of Dr. Waynette Buttery.  It was noted that the Librium had been prescribed by Dr.  Tanda Rockers.  Because of the unclarity of whether the patient's plan was to  continue to be weaned off of Librium, or if the plan had changed, the  patient was continued on Librium at her home dose.  This should be  readdressed by the patient's primary care doctor.  [name] anxiety was  within control at discharge.  #6.  Hypertension.  The patient was continued on Altace without any changes.      Kerby Nora, M.D.                         Pearlean Brownie, M.D.    AB/MEDQ  D:  12/03/2003  T:  12/03/2003  Job:  [id number]   cc:   Viviann Spare A. Waynette Buttery, M.D.  [facility] - Resident -  [place], [place] [address]  Fax: [phone]

## 2011-05-07 NOTE — Discharge Summary (Signed)
NAMEMAELY, [name]                          ACCOUNT NO.:  [id number]   MEDICAL RECORD NO.:  [id number]                   PATIENT TYPE:  INP   LOCATION:  5004                                 FACILITY:  [facility]   PHYSICIAN:  Penni Bombard, MD                    DATE OF BIRTH:  [birth year]   DATE OF ADMISSION:  12/20/2003  DATE OF DISCHARGE:  12/24/2003                                 DISCHARGE SUMMARY   FINAL DIAGNOSES:  1. Tylenol overdose without further  complications.  2. Depression and anxiety disorder not otherwise specified.  3. Hypertension.  4. Chronic obstructive pulmonary disease.  5. Hyperlipidemia.  6. Osteopenia.  7. Obesity.  8. Tobacco abuse.  9. Status post recent  bone spur removal from left foot.  10.      Advanced directives none, do not resuscitate status full code.   PHYSICIANS:  1. Primary care physician Viviann Spare A. Waynette Buttery, M.D. at [facility].  2. Referring physician none.  3. Consulting physician Antonietta Breach, M.D., with psychiatry.   PROCEDURES PERFORMED:  None.   LABORATORY DATA:  Initial acetaminophen level 26.1. CMP at the time of  discharge:  Sodium  137, potassium  3.4, chloride 109, CO2 25, glucose 217,  BUN 7, creatinine 0.5. Total bilirubin 0.4, alkaline phosphatase 54, SGOT  14, SGPT  12, total protein 5.5, albumin 2.7, calcium 8.2. CBC:  WBC 6.8,  hemoglobin 9.8, hematocrit 30.9, platelets 355. Protime 13.0, INR 1.0.  Cardiac markers negative x3.   HISTORY OF PRESENT ILLNESS:  [name] is a patient well known to the  [facility]  who presented to the [facility] emergency  room stating that she had taken approximately  50 Tylenol over the previous  24 hours secondary to unremitting left foot pain that she attributed to a  bone spur removal that she had performed approximately 4 weeks ago. The  patient had called EMS when she ran out of her Tylenol  and the pain  persisted. She was found to have  an acetaminophen level of 26.1, however,  she was completely  asymptomatic. The patient was then transferred to [facility] for administration of Mucomyst and observation per poison  control center protocol.   HOSPITAL COURSE:  PROBLEM #1, TYLENOL OVERDOSE:  The patient received 140 mg  per kg p.o. of Mucomyst and then received 70 mg/kg p.o. of Mucomyst q.4h. x6  doses. At the time of admission her LFTs were all within normal range as  well as her PT and INR were also within normal limits at the time of  admission. Within the 24 hour time period her acetaminophen level dropped to  less than 10 and no further workup was needed, as her LFTs and INR were  normal and she had no further  symptoms.   PROBLEM #2, FOOT  PAIN:  The patient was started on Dilaudid at the time of  admission for her reported severe foot pain. She states that the pain is at  the site of removal of a bone spur approximately 4 weeks ago. She was  supplied with wooden foot boots for ambulating. Throughout her hospital  course the patient continually demonstrated drug seeking characteristics,  becoming quite irate and crying frequently when further  narcotics were  denied. However, the patient did refuse ibuprofen when offered. Of note the  patient does have a contract with the [facility]  that we will  not supply her with narcotics as she has a history of misusing these drugs  in the past. At the time of discharge the patient was still  requesting  extra narcotics and it was explained to her repeatedly that we could not  discharge her with excessive  amounts of narcotics.   PROBLEM #3, PSYCHIATRIC, ANXIETY DISORDERS:  Throughout her hospital stay  the patient demonstrated great anxiety and noncompliance with care and  treatment. She was often very demanding and was unreasonable with regard to  request. On her 4th day of hospital  admission, she was evaluated by Dr.  Jeanie Sewer after she was found  wandering the hall, stating that she wanted  to die and it wasn't worth living. Dr. Jeanie Sewer evaluated the patient and  felt that she could benefit from inpatient treatment at [facility]. The patient was willing to do this and also felt that voluntary  commitment would be of service to her. The patient was discharged from the  hospital  to [facility]. Throughout her hospital stay her  current home medications  for her anxiety disorder and her depressive  disorders were continued. These include Effexor, Librium, Seroquel and  Trazodone.   PROBLEM #4, CHRONIC OBSTRUCTIVE PULMONARY DISEASE:  The patient has a  prominent baseline wheeze that continued throughout her hospital course.  Throughout her stay her, she maintained her sats at approximately  the mid  80s and improved greatly on oxygen. Home oxygen was set up for her and she  has been on home oxygen in the past.   PROBLEM #5, HYPERTENSION:  This was stable and her home medications were  continued throughout her hospital stay.   FOLLOW UP:  The patient was  instructed to follow up with Dr. Waynette Buttery at [facility] upon leaving [facility]. She was also instructed to follow up with her outpatient  psychiatrist, Dr. Mila Homer upon leaving the [facility].   DISCHARGE INSTRUCTIONS:  She was instructed that we would not be able to  provide her with narcotics beyond her stay at [facility] and  that she is to use ibuprofen for pain control. She was instructed to wear  the wooden boots as needed to help with ambulation. The patient was  encouraged to ambulate as this would help with her pain.   DISCHARGE MEDICATIONS:  1. Xopenex 1.25 mg nebulizer q.i.d.  2. Actos 50 mg 1 tablet  p.o. every day.  3. Altace 10 mg 1 tablet  p.o. every day.  4. Amaryl 4 mg 1 tablet p.o. every day. 5. Aspirin 81 mg 1 tablet  p.o. every day.  6.  Atrovent 500 micrograms q.i.d.  7. Effexor 300 mg 1 tablet  q. h.s.  8. Glucophage 100 mg p.o. b.i.d.  9. Librium 25 mg p.o. t.i.d.  10.  Neurontin 400 mg p.o. t.i.d.  11.      Nexium 40 mg 1 tablet  p.o. every day.  12.      Pulmicort Respules 2 mg b.i.d.  13.      Seroquel 300 mg 1 tablet  p.o. q. h.s.  14.      Singulair 10 mg 1 tablet  p.o. every day.  15.      Trazodone 200 mg q. h.s. p.r.n. sleep.  16.      Ibuprofen 600 mg p.o. q.6h. p.r.n. pain.                                                Penni Bombard, MD    SJ/MEDQ  D:  12/24/2003  T:  12/24/2003  Job:  [id number]   cc:   Viviann Spare A. Waynette Buttery, M.D.  Fam. Med - Resident - [place], [place] [address]  Fax: [phone]

## 2011-05-07 NOTE — H&P (Signed)
[name], [name]                ACCOUNT NO.:  [id number]   MEDICAL RECORD NO.:  [id number]          PATIENT TYPE:  INP   LOCATION:  1823                         FACILITY:  [facility]   PHYSICIAN:  Pearlean Brownie, M.D.DATE OF BIRTH:  1941/09/05   DATE OF ADMISSION:  07/22/2006  DATE OF DISCHARGE:                                HISTORY & PHYSICAL   CHIEF COMPLAINT:  Unresponsive.   HISTORY OF PRESENT ILLNESS:  [name] is a 70 year old patient with a very  lengthy past medical history which will outlined below.  She was brought to  the emergency room via EMS after a family member found her unresponsive this  morning.  She was down for an unknown period of time, and there was no known  instigating cause.  In the emergency room, she has been given Ceftriaxone  for presumed urosepsis, given a positive UA and a recent positive urine  culture.   She has been in the emergency room multiple times over the last month, most  recently for shortness of breath which was felt to be anxiety-related.  She  is also on chronic Klonopin and has a known history of buying  benzodiazepines off the street.  She was in the emergency room on July 28th  for a headache, July 30th for low blood pressure and diarrhea, that is when  the urine specimen was collected that showed a UTI on July 31st for a  headache, and again on August 1st for shortness of breath which was felt to  be anxiety related.   REVIEW OF SYSTEMS:  Unable to obtain secondary to level of consciousness.   PAST MEDICAL HISTORY:  1.  Diabetes mellitus type 2.  2.  Hyperlipidemia.  3.  Major depression.  4.  Anxiety.  5.  Hypertension.  6.  COPD.  7.  Osteopenia.  8.  Obesity.  9.  Tobacco abuse.  10. History of DVT.  11. Cataract.  12. Chronic allergic rhinitis.   PAST SURGICAL HISTORY:  Cholecystectomy in 1981, hysterectomy 1969.   MEDICATIONS:  1.  Actos 45 mg, one p.o. daily.  2.  Advair 250/50 b.i.d.  3.  Albuterol q.i.d.  p.r.n.  4.  Amaryl 8 mg p.o. daily.  5.  Aspirin 1 p.o. daily, unsure dose.  6.  Atrovent 2 puffs inhaled q.i.d.  7.  Coumadin as directed.  8.  Effexor 300 mg p.o. q.h.s.  9.  Enalapril 40 mg p.o. daily.  10. Flonase 2 puffs q.nostril daily.  11. Glucophage 100 mg p.o. b.i.d.  12. Klonopin 1 mg p.o. t.i.d.  13. Lasix 20 mg daily.  14. Neurontin 400 mg p.o. t.i.d.  15. Prilosec 20 mg p.o. daily.  16. Singulair 10 mg p.o. daily.  17. Trazodone 200 mg p.o. q.h.s.  18. Vicodin 30 tablets per month per pain contrast, unsure how these are      taken.  19. Vytorin 10/40, one p.o. daily.   ALLERGIES:  BACTRIM.   FAMILY HISTORY:  Father and two aunts with coronary artery disease.  Aunt  with diabetes mellitus.  Uncle and mother with  stroke.  No history of  cancer.  Mother died of heart failure, osteoporosis.   SOCIAL HISTORY:  She is on disability due to COPD.  She lives in an  apartment in [place].  She is divorced.  She has a 20-pack year history  of tobacco use, but no cigarettes since November of 2005.  Denies alcohol or  drugs.  She has one sister in [place], two daughters in [place].  She  has home health assistance.   PHYSICAL EXAMINATION:  VITAL SIGNS:  Temp 97.4, heart rate range from 86 to  95, blood pressure range from 111/67 to 132/69, respirations 22, saturations  100% on room air and on 2 L nasal cannula.  GENERAL:  She is minimally responsive with a c-spine collar in place.  MENTAL STATUS:  She has a decreased level of consciousness when she gets her  name only, cannot give place of city, date, etc.  HEENT:  She is normocephalic, atraumatic.  Her pupils are equal but  sluggishly reactive.  Ears appear to be within normal limits.  Mouth:  She  is edentulous.  She has no lymphadenopathy or thyromegaly.  LUNGS:  She has scattered wheezes bilaterally.  I could not appreciate  crackles; however, could not get a thorough exam.  CARDIOVASCULAR:  Regular rate and  rhythm, no murmurs noted.  ABDOMEN:  Soft and obese.  She has positive bowel sounds, and it was  questionably tender to palpation with deep palpation only.  No rebound or  guarding.  EXTREMITIES:  She has multiple ecchymoses but no edema.  Pulses 2+ radial  and dorsalis pedis pulses.  NEUROLOGIC:  Pupils equal but sluggish, otherwise was unable to assess her  cranial nerves.  She appeared to have 2+ DTR's in her bilateral upper and  lower extremities which were equal and normal Babinski.  I could not assess  her strength or gait, secondary to her decreased level of consciousness.  SKIN:  She has multiple ecchymoses and no lymphadenopathy was noted.   LABORATORY:  Check blood cultures, and urine culture is pending; however  urine culture performed on July 18, 2006 showed greater than 100,000  colonies of E. Coli sensitive to Cipro, Ceftriaxone, Gentamicin,  Levofloxacin, nitrofurantoin and Tobramycin that was resistant to Septra,  Ampicillin and cefazolin.  CBC:  White count 11.9, H&H of 10.7 and 33.6,  platelets 230.  Neutrophils 81%, lymphocytes 12%, monocytes 5%, eosinophils  1%, basophils 1%.  I-Stat 8, pH 7.233, PCO2 45.5, bicarb 19.2, sodium 131,  potassium 4.3, chloride 114, bicarb 19.2, BUN 7, creatinine 0.4, glucose  187, H&H 12.6, 37.  Urinalysis showed yellow cloudy urine, specific gravity  1.021, pH 6.0, negative for glucose, negative for bilirubin, negative for  ketones, trace blood.  Negative for protein, 0.2 urobilinogen, positive for  nitrates, large leuk esterase, 21-50 white blood cells, 0-2 red blood cells  with many bacteria, PT 26.8, INR 2.3.  Chest x-ray showed no acute findings.  Head and neck CT show mild atrophy, no acute findings, and c-spine showed  mild degenerative changes but no acute findings.   ASSESSMENT/PLAN:  A 70 year old female with:  1.  Unresponsiveness.  Obviously, the list of possible causes here is huge.     Given her recent positive urine  culture without treatment, urosepsis is      high on the differential.  However, her vital signs are very stable, so      this is an odd picture.  However, we will start IV  antibiotics.  Her E.      Coli is sensitive to Cipro, as this seems like a reasonable choice.  We      will follow her cultures.   This could be a head injury or stroke, given she was found down and no one  is sure exactly what happened, but a CT scan revealed no acute findings, so  this seems less likely.  Chronic obstructive pulmonary disease exacerbation  is also a possibility, but her O2 sats were 100% on room air and on 2L nasal  cannula.   Diabetic complications such as HONK seem unlikely, as her glucose is only  187.   Myocardial infarction or a pulmonary embolus could also be possibilities, as  she is therapeutic on her Coumadin, leading me to believe that pulmonary  embolus would be less likely.  Her risks for coronary artery disease include  hypercholesterolemia, diabetes mellitus type 2, obesity, lengthy tobacco  history and hypertension as well as family history.  Will check cardiac  enzymes x3 sets, but her EKG was within normal limits.   Ingestion also seems like a very likely possibility, so will check urine  drug screen along with salicylates and Tylenol levels.  Patient has had a  lot of anxiety in the last couple of days, and she has a history of buying  benzodiazepines off the street.  She is also on chronic benzos.  Would  consider [place], if her urine drug screen is positive for benzos.   1.  Chronic obstructive pulmonary disease.  I am going to give albuterol and      Atrovent nebs q.4 h. until she is awake enough to resume her home meds.  2.  Diabetes mellitus type 2.  Because she is NPO, I will give her sliding      scale insulin with q.4 h. CBG checks.  Will resume her home medications      when she is awake.  3.  Depression and anxiety.  Will resume her home medications when she is       awake enough to take p.o.  4.  History of deep venous thrombosis.  I giver her Lovenox until she is      awake enough to resume her Coumadin.  She was therapeutic when she came      into the emergency department.  5.  Hypertension.  Her blood pressure is very stable, will resume her home      medications when she is awake.  6.  Hyperlipidemia.  Will resume on Vytorin when she is awake.  7.  Anemia.  Unsure of her baseline.  She is hemodynamically stable,      however, so will recheck in the a.m.  The inpatient team could consider      checking heme-occult on stool, if her hemoglobin continues to drop.      Ursula Beath, MD    ______________________________  Pearlean Brownie, M.D.    JT/MEDQ  D:  07/22/2006  T:  07/22/2006  Job:  [id number]

## 2011-05-07 NOTE — Discharge Summary (Signed)
[name], [name]                          ACCOUNT NO.:  [id number]   MEDICAL RECORD NO.:  [id number]                   PATIENT TYPE:  INP   LOCATION:  5004                                 FACILITY:  [facility]   PHYSICIAN:  Pearlean Brownie, M.D.            DATE OF BIRTH:  August 03, 1941   DATE OF ADMISSION:  12/20/2003  DATE OF DISCHARGE:  12/27/2003                                 DISCHARGE SUMMARY   ADDENDUM:   PRIMARY CARE PHYSICIAN:  Viviann Spare A. Waynette Buttery, M.D.   FINAL DIAGNOSES:  Unchanged from primary discharge.   PROCEDURES:  Procedures performed between December 24, 2003, and December 27, 2003, include x-ray of her left foot which was within normal limits and  chest x-ray which showed only mild to moderate chronic interstitial lung  disease.   HOSPITAL COURSE:  The patient was evaluated by Dr. Jeanie Sewer prior to  original dictation on December 24, 2003.  The patient was to be transferred to  [facility] for inpatient therapy of her anxiety disorder.  However,  Behavioral Health would not accept the patient.  Social work services worked  extremely hard to try to find the patient a bed at all of the local  behavioral health services in the area and the patient was denied admission  from all of them.  Extensive discussions were held with the patient to her  options with regard to discharge and the patient's final options were  admission to an assisted living facility or to go home with her current home  health aid setup.  The patient was unwilling to consider assisted living, so  she chose to go home.  Of note, her daughter has set up her house so that  she no longer needs to climb steps and everything she needs is available to  her on the lower level of her apartment.  Of note, on the day prior to  discharge, the patient had two falls during the night secondary to an  incontinence episode.  The patient slipped on her urine at the time of her  incontinence.  The patient only  sustained minor bruises and lacerations to  her right arm.  On the second fall, she sustained a small bump to her head.  Her vital signs and neural status were monitored overnight with no changes.  It was felt that no further workup was necessary.  The patient continued to  be very anxious over the past several days and her Librium was changed to  Klonopin, which seemed to help somewhat with her anxiety.  The patient was  not discharged on any narcotics and was discharged from the pharmacy with  one to two days of Klonopin, prednisone, and ibuprofen for pain.  Of note,  two days prior to discharge, the patient had decrease in oxygen saturations.  A chest x-ray was performed which showed only chronic disease.  Given the  patient's wheezing, she was  started on prednisone.  The patient denied any  sputum changes or any increase in sputum production and remained afebrile,  so she was not started on antibiotics.  This was thought to represent a mild  COPD exacerbation.  The patient was sent home with a five-day course of 30  mg of  prednisone.  The patient is to follow up with Viviann Spare A. Waynette Buttery, M.D., at  [facility] and also Dr. Mila Homer at [facility].  I am still working on scheduling those appointments at the time of  this dictation.      Penni Bombard, MD                          Pearlean Brownie, M.D.    SJ/MEDQ  D:  12/27/2003  T:  12/28/2003  Job:  [id number]   cc:   Ezzard Flax  [address] [place]  [place] [address]  Fax: [phone]   Maylon Peppers. Waynette Buttery, M.D.  Fam. Med - Resident - [place], [place] [address]  Fax: [phone]

## 2011-05-10 ENCOUNTER — Ambulatory Visit (INDEPENDENT_AMBULATORY_CARE_PROVIDER_SITE_OTHER): Payer: Medicare Other | Admitting: Surgery

## 2011-05-10 ENCOUNTER — Encounter (INDEPENDENT_AMBULATORY_CARE_PROVIDER_SITE_OTHER): Payer: Medicare Other

## 2011-05-10 DIAGNOSIS — I803 Phlebitis and thrombophlebitis of lower extremities, unspecified: Secondary | ICD-10-CM

## 2011-05-10 DIAGNOSIS — M7989 Other specified soft tissue disorders: Secondary | ICD-10-CM

## 2011-05-10 DIAGNOSIS — M79609 Pain in unspecified limb: Secondary | ICD-10-CM

## 2011-05-10 DIAGNOSIS — I743 Embolism and thrombosis of arteries of the lower extremities: Secondary | ICD-10-CM

## 2011-05-11 NOTE — Assessment & Plan Note (Signed)
OFFICE VISIT  [name], [name] DOB:  05/25/1941                                       05/10/2011 IONGE#:[id number]  The patient comes back today for followup.  I have been seeing her for leg swelling.  She has a history of a PE and DVT as well as a retroperitoneal bleed.  She had a Cook select filter placed in 2010 by radiology because she was unable to be anticoagulated.  She came to me with concerns of swelling and pain.  I placed her in compression stockings and brought her back today for an ultrasound.  She states the compression stockings have been helping to some extent.  PHYSICAL EXAMINATION:  Vital signs:  Her heart rate is 87, blood pressure 139/88, O2 sats 96% on 3 liters.  General:  She is resting in no acute distress.  Respirations:  Nonlabored.  Pedal pulses are not palpable.  The edema in her legs is decreased.  DIAGNOSTIC STUDIES:  Duplex ultrasound reveals a competent greater saphenous vein bilaterally.  The deep system is not competent and the left small saphenous vein is not competent.  ASSESSMENT:  Bilateral leg swelling,  PLAN:  I discussed the ultrasound findings today with the patient and her daughter.  I do not think she has options for laser ablation to help with her swelling.  This is predominately due to deep system reflux which is best treated with compression therapy.  I have recommended that we increase her compression to 30 mmHg to 40 mmHg as well as getting her into thigh high compression garments.  The patient understands this and I have given them a prescription for the stockings today.  She will see me back on a p.r.n. basis.    Jorge Ny, MD Electronically Signed  VWB/MEDQ  D:  05/10/2011  T:  05/11/2011  Job:  [id number]  cc:   Ellery Plunk, MD

## 2011-05-31 NOTE — Procedures (Unsigned)
LOWER EXTREMITY VENOUS REFLUX EXAM  INDICATION:  Edema.  EXAM:  Using color-flow imaging and pulse Doppler spectral analysis, the bilateral common femoral, femoral, popliteal, posterior tibial, great and small saphenous veins were evaluated.  There is evidence suggesting deep venous insufficiency in the bilateral popliteal veins of the lower extremity.  The bilateral saphenofemoral junction is competent.  The bilateral GSV is competent.  No calibers are described below.  The right proximal small saphenous vein demonstrates competency.  The left proximal small saphenous vein demonstrates incompetency with diameter measurements ranging from 0.32 cm to 0.42 cm.  GSV Diameter (used if found to be incompetent only)                                           Right    Left Proximal Greater Saphenous Vein           cm       cm Proximal-to-mid-thigh                     cm       cm Mid thigh                                 cm       cm Mid-distal thigh                          cm       cm Distal thigh                              cm       cm Knee                                      cm       cm  IMPRESSION: 1. 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 2. Poorly visualized calf veins due to edema and patient inability to     externally rotate extremities. 3. Left greater saphenous vein thrombosis which is nonoccluding from     the mid to distal thigh segment. 4. The bilateral great saphenous vein is competent. 5. The bilateral great saphenous vein is not tortuous. 6. The deep venous system is not competent with reflux of >500     milliseconds. 7. The right small saphenous vein is competent. 8. The left small saphenous vein is not competent with reflux of >500     milliseconds.  ___________________________________________ V. Charlena Cross, MD  SH/MEDQ  D:  05/10/2011  T:   05/10/2011  Job:  [id number]

## 2011-06-02 ENCOUNTER — Encounter: Payer: Self-pay | Admitting: Family Medicine

## 2011-06-02 ENCOUNTER — Ambulatory Visit (INDEPENDENT_AMBULATORY_CARE_PROVIDER_SITE_OTHER): Payer: Medicare Other | Admitting: Family Medicine

## 2011-06-02 VITALS — BP 116/71 | HR 103 | Temp 98.1°F | Wt 180.0 lb

## 2011-06-02 DIAGNOSIS — M25511 Pain in right shoulder: Secondary | ICD-10-CM

## 2011-06-02 DIAGNOSIS — E119 Type 2 diabetes mellitus without complications: Secondary | ICD-10-CM

## 2011-06-02 DIAGNOSIS — G8929 Other chronic pain: Secondary | ICD-10-CM

## 2011-06-02 DIAGNOSIS — M25519 Pain in unspecified shoulder: Secondary | ICD-10-CM

## 2011-06-02 MED ORDER — ALPRAZOLAM 1 MG PO TABS: 1.0000 mg | ORAL_TABLET | Freq: Four times a day (QID) | ORAL | Status: DC

## 2011-06-02 MED ORDER — OXYCODONE HCL 20 MG PO TB12
20.0000 mg | ORAL_TABLET | Freq: Two times a day (BID) | ORAL | Status: DC
Start: 2011-06-02 — End: 2011-07-28

## 2011-06-02 MED ORDER — OXYCODONE HCL 5 MG PO TABS: 5.0000 mg | ORAL_TABLET | Freq: Four times a day (QID) | ORAL | Status: DC | PRN

## 2011-06-02 MED ORDER — OXYCODONE HCL 20 MG PO TB12: 20.0000 mg | ORAL_TABLET | Freq: Two times a day (BID) | ORAL | Status: DC

## 2011-06-02 MED ORDER — OXYCODONE HCL 5 MG PO TABS
5.0000 mg | ORAL_TABLET | Freq: Four times a day (QID) | ORAL | Status: DC | PRN
Start: 2011-06-02 — End: 2011-07-28

## 2011-06-02 MED ORDER — METFORMIN HCL 1000 MG PO TABS: 1000.0000 mg | ORAL_TABLET | Freq: Two times a day (BID) | ORAL | Status: DC

## 2011-06-02 NOTE — Patient Instructions (Signed)
Please stop the percocet and switch to oxycodone (short acting) to take as needed every 6 hours up to 3 times/day Continue oxycontin  Go get the tylenol arthritis (slow release) and take 650mg  every 6 hours scheduled for pain  Take the new dose of metformin 1000 twice a day

## 2011-06-03 DIAGNOSIS — G8929 Other chronic pain: Secondary | ICD-10-CM | POA: Insufficient documentation

## 2011-06-03 DIAGNOSIS — M25511 Pain in right shoulder: Secondary | ICD-10-CM | POA: Insufficient documentation

## 2011-06-03 NOTE — Assessment & Plan Note (Signed)
Recent CBGs in 200s with no lows.   Lab Results  Component Value Date   HGBA1C 7.6 06/02/2011   Will increase metformin to 1000 mg BID

## 2011-06-03 NOTE — Assessment & Plan Note (Signed)
Pt complaining of pain in legs not helped by her oxycontin/percocet.  Discussed with Dr. Sheffield Slider.  Will separate oxycodone and tylenol.  Tylenol 650 mg q6 hours scheduled and oxycodone PRN.  Has tramadol.

## 2011-06-03 NOTE — Progress Notes (Signed)
  Subjective:    Patient ID: [name], female    DOB: 1941-09-23, 70 y.o.   MRN: [id number]  HPI  R Shoulder pain- chronic x several years.  Daughter thinks this is swelling more now than before. Painful to pt.  No recent injury but did have injury several years ago with chronic problems.  Leg pain bilateral- chronic leg pain, swelling, hx of DVT and IVC filter.  Has compression hose.  They are not helping much.  Wants to increase pain meds  DM- CBGs in 200-300.  Normally just on metformin 500.  No lows.    Review of Systems    denies HA , SOB, CP Objective:   Physical Exam     Vital signs reviewed General appearance - alert, well appearing, and in no distress and oriented to person, place, and time-- sensitive to light touch all over body.   Heart - normal rate, regular rhythm, normal S1, S2, no murmurs, rubs, clicks or gallops Chest - clear to auscultation, no wheezes, rales or rhonchi, symmetric air entry, no tachypnea, retractions or cyanosis Extremities - peripheral pulses normal, pink lower legs, 1 plus edema no lesions     Assessment & Plan:

## 2011-06-03 NOTE — Assessment & Plan Note (Signed)
Pt complains of swelling and pain in shoulder x several years.  She and daughter would like imaging.  No focal finding, full ROM.  Will get shoulder films to eval for OA>

## 2011-06-09 ENCOUNTER — Ambulatory Visit (HOSPITAL_COMMUNITY)
Admission: RE | Admit: 2011-06-09 | Discharge: 2011-06-09 | Disposition: A | Discharge status: Home / Self Care | Payer: Medicare Other | Source: Ambulatory Visit | Attending: Family Medicine | Admitting: Family Medicine

## 2011-06-09 DIAGNOSIS — J4489 Other specified chronic obstructive pulmonary disease: Secondary | ICD-10-CM | POA: Insufficient documentation

## 2011-06-09 DIAGNOSIS — E119 Type 2 diabetes mellitus without complications: Secondary | ICD-10-CM | POA: Insufficient documentation

## 2011-06-09 DIAGNOSIS — J449 Chronic obstructive pulmonary disease, unspecified: Secondary | ICD-10-CM | POA: Insufficient documentation

## 2011-06-09 DIAGNOSIS — R609 Edema, unspecified: Secondary | ICD-10-CM | POA: Insufficient documentation

## 2011-06-09 DIAGNOSIS — I517 Cardiomegaly: Secondary | ICD-10-CM

## 2011-07-08 ENCOUNTER — Telehealth: Payer: Self-pay | Admitting: Family Medicine

## 2011-07-08 NOTE — Telephone Encounter (Signed)
Spoke with [name], the form she is referring to is a certificate of medical necessity, she will refax to my attention and I will place in MD box.

## 2011-07-08 NOTE — Telephone Encounter (Signed)
Checking status of form that has been faxed to [place] several times, has been waiting since June to get back.

## 2011-07-14 ENCOUNTER — Telehealth: Payer: Self-pay | Admitting: Family Medicine

## 2011-07-14 NOTE — Telephone Encounter (Signed)
Called work, unable to reach melody.  Called home.  Left message that I agree with Jessica's statements and that if she had further questions she could call back again.

## 2011-07-14 NOTE — Telephone Encounter (Signed)
Form to faxed box

## 2011-07-14 NOTE — Telephone Encounter (Signed)
Need to speak with nurse regarding mother's sleeplessness.  Want to have Seroquel mg increased.  Daughter is at her wits end trying to cope with her issue.  Please call back asap

## 2011-07-14 NOTE — Telephone Encounter (Signed)
Back to MD.  As suspected dgt would like a call from MD.  She is aware of appt with psychiatrist, but states that she cant do that right now.  Gave extra dose of seroquel last night and "notice a difference, made mom more pleasant".  I am unable to do anything else as dgt is wanting to speak with MD.  Milas Gain, Maryjo Rochester

## 2011-07-14 NOTE — Telephone Encounter (Signed)
Psychiatrist needed to change psych meds.  i have told them this at previous visits.  I recommend that she be seen by psychiatrist for medication changes

## 2011-07-15 ENCOUNTER — Encounter: Payer: Self-pay | Admitting: Family Medicine

## 2011-07-21 ENCOUNTER — Emergency Department (HOSPITAL_COMMUNITY)
Admission: EM | Admit: 2011-07-21 | Discharge: 2011-07-21 | Disposition: A | Discharge status: Home / Self Care | Payer: Medicare Other | Attending: Emergency Medicine | Admitting: Emergency Medicine

## 2011-07-21 DIAGNOSIS — Z86718 Personal history of other venous thrombosis and embolism: Secondary | ICD-10-CM | POA: Insufficient documentation

## 2011-07-21 DIAGNOSIS — R609 Edema, unspecified: Secondary | ICD-10-CM

## 2011-07-21 DIAGNOSIS — I872 Venous insufficiency (chronic) (peripheral): Secondary | ICD-10-CM | POA: Insufficient documentation

## 2011-07-21 DIAGNOSIS — M79609 Pain in unspecified limb: Secondary | ICD-10-CM | POA: Insufficient documentation

## 2011-07-21 DIAGNOSIS — F319 Bipolar disorder, unspecified: Secondary | ICD-10-CM | POA: Insufficient documentation

## 2011-07-21 DIAGNOSIS — J449 Chronic obstructive pulmonary disease, unspecified: Secondary | ICD-10-CM | POA: Insufficient documentation

## 2011-07-21 DIAGNOSIS — Z79899 Other long term (current) drug therapy: Secondary | ICD-10-CM | POA: Insufficient documentation

## 2011-07-21 DIAGNOSIS — J4489 Other specified chronic obstructive pulmonary disease: Secondary | ICD-10-CM | POA: Insufficient documentation

## 2011-07-21 DIAGNOSIS — E119 Type 2 diabetes mellitus without complications: Secondary | ICD-10-CM | POA: Insufficient documentation

## 2011-07-21 DIAGNOSIS — G8929 Other chronic pain: Secondary | ICD-10-CM | POA: Insufficient documentation

## 2011-07-21 DIAGNOSIS — I10 Essential (primary) hypertension: Secondary | ICD-10-CM | POA: Insufficient documentation

## 2011-07-21 LAB — BASIC METABOLIC PANEL
BUN: 8 mg/dL (ref 6–23)
Chloride: 97 mEq/L (ref 96–112)
GFR calc Af Amer: >60 mL/min (ref >60)
Glucose, Bld: 163 mg/dL — ABNORMAL HIGH (ref 70–99)
Potassium: 3.9 mEq/L (ref 3.5–5.1)

## 2011-07-21 LAB — CBC
HCT: 41 % (ref 36.0–46.0)
MCH: 29.9 pg (ref 26.0–34.0)
MCHC: 31 g/dL (ref 30.0–36.0)
RDW: 12.6 % (ref 11.5–15.5)

## 2011-07-21 LAB — APTT: aPTT: 27 seconds (ref 24–37)

## 2011-07-21 LAB — DIFFERENTIAL
Basophils Absolute: 0 10*3/uL (ref 0.0–0.1)
Eosinophils Relative: 2 % (ref 0–5)
Lymphocytes Relative: 35 % (ref 12–46)
Monocytes Absolute: 0.6 10*3/uL (ref 0.1–1.0)
Monocytes Relative: 9 % (ref 3–12)

## 2011-07-28 ENCOUNTER — Encounter: Payer: Self-pay | Admitting: Family Medicine

## 2011-07-28 ENCOUNTER — Ambulatory Visit (INDEPENDENT_AMBULATORY_CARE_PROVIDER_SITE_OTHER): Payer: Medicare Other | Admitting: Family Medicine

## 2011-07-28 DIAGNOSIS — B379 Candidiasis, unspecified: Secondary | ICD-10-CM

## 2011-07-28 DIAGNOSIS — F319 Bipolar disorder, unspecified: Secondary | ICD-10-CM

## 2011-07-28 DIAGNOSIS — K219 Gastro-esophageal reflux disease without esophagitis: Secondary | ICD-10-CM

## 2011-07-28 DIAGNOSIS — G4700 Insomnia, unspecified: Secondary | ICD-10-CM

## 2011-07-28 DIAGNOSIS — M19079 Primary osteoarthritis, unspecified ankle and foot: Secondary | ICD-10-CM

## 2011-07-28 MED ORDER — NYSTATIN 100000 UNIT/GM EX POWD: CUTANEOUS | Status: DC

## 2011-07-28 MED ORDER — ESOMEPRAZOLE MAGNESIUM 40 MG PO CPDR: 40.0000 mg | DELAYED_RELEASE_CAPSULE | Freq: Every day | ORAL | Status: DC

## 2011-07-28 NOTE — Assessment & Plan Note (Signed)
Pt reports taht she still has reflux even with omeprazole and that she did better with nexium.  Will switch to nexium and may refer to GI if not improved on this medicaiton

## 2011-07-28 NOTE — Patient Instructions (Signed)
For the constipation  1. Dulcolax 10 mg rectally daily as needed for severe constipation.  2. Colace 100 mg p.o. twice daily.   3. MiraLax 17 g p.o. Twice a day   For the pain We will refer you to pain clinic to consider starting a stronger patch medication   For the preop evaluation: I think you are ok for surgery for this low risk procedure  For the psychiatric meds: Please call Dr. Pascal Lux at [phone] to make an appt This is for mood disorder clinic.   They may be able to help with your medicaitons

## 2011-07-28 NOTE — Assessment & Plan Note (Signed)
This is a major problem for daughter who has to stay up with pt to care for her at night.  They would like to increase seroquel.  I will defer to [facility] for management.

## 2011-07-28 NOTE — Assessment & Plan Note (Signed)
Advised careful drying of skin, nystatin powder

## 2011-07-28 NOTE — Progress Notes (Signed)
Addended by: Reginold Agent on: 07/28/2011 05:02 PM   Modules accepted: Orders

## 2011-07-28 NOTE — Progress Notes (Signed)
  Subjective:    Patient ID: [name], female    DOB: 03-22-1941, 70 y.o.   MRN: [id number]  HPI  GERD- taking omeprazole BID but still having reflux at meals, not burning.  Not feeling full, no weight loss, no blood in stool  Constipation-  No BM in 5 days, stool is hard, no blood, no fissure.  Not taking anything for bowel regimen.  Arthritis and chronic pain- pain in shoulder, ankle, hip and legs bilaterally.  Stable.  Pain in left foot has been increasing in importance in pt's opinion, so she has had surgery eval.  They are considering surgery for achilles release.    Surgery eval-  Pt needs preop eval for achilles heel release.  No CP, stable on her O2 requirement, no CHF.  DM fairly well controlled.  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  IVC filter in place.  Chronic pain on narcotics.  Psych hx of anxiety and Bipolar.  Not ambulating due to pain but is mostly in the wheelchair.  Skin rash-  Under left breast has red rash that is worse in the heat.  Better with careful drying and powder.   Insomnia/Anxiety - up at night and this is very bothersome to daughter who takes care of her.  No manic episodes but anxiety is increasing.  She is fixating on her pain and thinks that she needs increasing doses of narcotics.  She also prefers name brands meds and has trouble understanding that these are the same.  Review of Systems Denies CP, SOB, HA, N/V/D, fever     Objective:   Physical Exam  Vital signs reviewed General appearance - alert, well appearing, and in no distress and oriented to person, place, and time Wheelchair in office Heart - normal rate, regular rhythm, normal S1, S2, no murmurs, rubs, clicks or gallops Chest - clear to auscultation, no wheezes, rales or rhonchi, symmetric air entry, no tachypnea, retractions or cyanosis Abdomen - soft, nontender, nondistended, no masses or organomegaly Ext- left foot with foot drop, ankle with  limited ROM.  Legs with compression hose in place.  No pitting edema       Assessment & Plan:  BIPOLAR DISORDER Pt and daughter want meds adjusted. Mood has been fairly stable but sleep is a problem.  They have not gone to a psychiatrist yet and continue to list barriers to them going.  Will try to send to [facility].  Will forward note to Dr. Pascal Lux.   INSOMNIA, CHRONIC This is a major problem for daughter who has to stay up with pt to care for her at night.  They would like to increase seroquel.  I will defer to [facility] for management.  GERD Pt reports taht she still has reflux even with omeprazole and that she did better with nexium.  Will switch to nexium and may refer to GI if not improved on this medicaiton  Ankle arthritis Pt plans for achilles release by Dr. Victorino Dike.  She needs preop evaluation today.  Since she is getting a low risk procedure, she does not need any work up for this.  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    Will refer to pain management clinic today as pt is not happy with current regimen.

## 2011-07-28 NOTE — Assessment & Plan Note (Signed)
Pt plans for achilles release by Dr. Victorino Dike.  She needs preop evaluation today.  Since she is getting a low risk procedure, she does not need any work up for this.  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    Will refer to pain management clinic today as pt is not happy with current regimen.

## 2011-07-28 NOTE — Assessment & Plan Note (Signed)
Pt and daughter want meds adjusted. Mood has been fairly stable but sleep is a problem.  They have not gone to a psychiatrist yet and continue to list barriers to them going.  Will try to send to [facility].  Will forward note to Dr. Pascal Lux.

## 2011-07-29 ENCOUNTER — Telehealth: Payer: Self-pay | Admitting: Family Medicine

## 2011-07-29 ENCOUNTER — Telehealth: Payer: Self-pay | Admitting: Psychology

## 2011-07-29 NOTE — Telephone Encounter (Signed)
[name] states that she was trying to explain to Dr. Hulen Luster yesterday that [name] 30-day rx's for Xanax and Oxycontin runs out on August 11 and the new rx's says not to fill until August 13 and she doesn't want her mom to be out of these meds for those two days. States if MD re-writes rx's she will come by to pick them up. Message to MD

## 2011-07-29 NOTE — Telephone Encounter (Signed)
[name] would like to speak with someone because as the dates go on some of her meds, she will be out of meds for 2 days before the date on the Rx and that will not work.  Please give her a call.  If she cannot be reached on the number listed, please call her work # which is [phone].

## 2011-07-29 NOTE — Telephone Encounter (Signed)
Has another # to call her back at.  [phone].

## 2011-07-29 NOTE — Telephone Encounter (Signed)
[name] has called back.  Someone returned her call but she didn't know who.

## 2011-07-29 NOTE — Telephone Encounter (Signed)
She was given a script for 8/13.  She cannot have any more until 9/13

## 2011-07-29 NOTE — Telephone Encounter (Signed)
[name] called to request an appointment for [facility] as discussed with Dr. Hulen Luster.  Dr. Hulen Luster and I met to discuss Ms. Castelli's needs.  I determined that given her mental health and medical issues, she would likely need a more intensive mental health situation than what the [facility] can provide.  [name] talked about how Ms. Blasko used to see Dr. Wynonia Lawman at the [facility] counseling center.  She had not checked to see if anyone took over for him.  She thinks that the insurance is a barrier.  I did recommend [facility] as a possibility.  She said that they overmedicate people and that while she thinks her mom needs more medication, she doesn't want her overmedicated.  Empathized with her and the complexity of Ms. Kerlin's medical problems.  She said she would call to see if she could get her in with a psychiatrist.

## 2011-07-29 NOTE — Telephone Encounter (Signed)
[name] call back and wants to talk to nurse pls call [phone]

## 2011-07-30 ENCOUNTER — Telehealth: Payer: Self-pay | Admitting: Family Medicine

## 2011-07-30 ENCOUNTER — Other Ambulatory Visit: Payer: Self-pay | Admitting: Family Medicine

## 2011-07-30 MED ORDER — OXYCODONE HCL 20 MG PO TB12: 20.0000 mg | ORAL_TABLET | Freq: Two times a day (BID) | ORAL | Status: DC

## 2011-07-30 MED ORDER — ALPRAZOLAM 1 MG PO TABS: 1.0000 mg | ORAL_TABLET | Freq: Four times a day (QID) | ORAL | Status: DC

## 2011-07-30 NOTE — Telephone Encounter (Signed)
Spoke with [name] again and gave her the message from MD, she states that if MD counts on the calendar form 7/13 she will see that her 30-day supply ends on 8-11 because all that the pharmacy would give Ms Mervine was a 30 day supply because that's all her ins would pay for, confirmed this with pharmacy. [name] wants MD to call her today at [phone], message to MD

## 2011-07-30 NOTE — Telephone Encounter (Signed)
Case manager dropped off FL-2 to be filled out.  Please call her when completed.

## 2011-07-30 NOTE — Telephone Encounter (Signed)
Called melody but couldn't get toher.  If she brings the script for xanax and oxycontin in and gives them to me, I will give her a new script to start the 11th.  She absolutely must come in for any more refills.

## 2011-08-09 ENCOUNTER — Telehealth: Payer: Self-pay | Admitting: Family Medicine

## 2011-08-09 NOTE — Telephone Encounter (Signed)
Pt called to ask if  papers have been filled out for pre op surgery with Dr Caesar Chestnut - pls advise

## 2011-08-11 NOTE — Telephone Encounter (Signed)
Informed patient that I faxed last office note to dr. Angelica Pou office, patient expressed understanding.

## 2011-08-18 ENCOUNTER — Emergency Department (HOSPITAL_COMMUNITY)
Admission: EM | Admit: 2011-08-18 | Discharge: 2011-08-18 | Disposition: A | Discharge status: Home / Self Care | Payer: Medicare Other | Attending: Emergency Medicine | Admitting: Emergency Medicine

## 2011-08-18 ENCOUNTER — Emergency Department (HOSPITAL_COMMUNITY): Payer: Medicare Other

## 2011-08-18 DIAGNOSIS — I10 Essential (primary) hypertension: Secondary | ICD-10-CM | POA: Insufficient documentation

## 2011-08-18 DIAGNOSIS — Z86718 Personal history of other venous thrombosis and embolism: Secondary | ICD-10-CM | POA: Insufficient documentation

## 2011-08-18 DIAGNOSIS — M545 Low back pain, unspecified: Secondary | ICD-10-CM | POA: Insufficient documentation

## 2011-08-18 DIAGNOSIS — E119 Type 2 diabetes mellitus without complications: Secondary | ICD-10-CM | POA: Insufficient documentation

## 2011-08-24 ENCOUNTER — Emergency Department (HOSPITAL_COMMUNITY)
Admission: EM | Admit: 2011-08-24 | Discharge: 2011-08-24 | Disposition: A | Discharge status: Home / Self Care | Payer: Medicare Other | Attending: Emergency Medicine | Admitting: Emergency Medicine

## 2011-08-24 DIAGNOSIS — J4489 Other specified chronic obstructive pulmonary disease: Secondary | ICD-10-CM | POA: Insufficient documentation

## 2011-08-24 DIAGNOSIS — G8929 Other chronic pain: Secondary | ICD-10-CM | POA: Insufficient documentation

## 2011-08-24 DIAGNOSIS — E119 Type 2 diabetes mellitus without complications: Secondary | ICD-10-CM | POA: Insufficient documentation

## 2011-08-24 DIAGNOSIS — J449 Chronic obstructive pulmonary disease, unspecified: Secondary | ICD-10-CM | POA: Insufficient documentation

## 2011-08-24 DIAGNOSIS — M79609 Pain in unspecified limb: Secondary | ICD-10-CM | POA: Insufficient documentation

## 2011-08-24 DIAGNOSIS — R609 Edema, unspecified: Secondary | ICD-10-CM | POA: Insufficient documentation

## 2011-08-24 DIAGNOSIS — F411 Generalized anxiety disorder: Secondary | ICD-10-CM | POA: Insufficient documentation

## 2011-08-24 DIAGNOSIS — I10 Essential (primary) hypertension: Secondary | ICD-10-CM | POA: Insufficient documentation

## 2011-08-24 DIAGNOSIS — R300 Dysuria: Secondary | ICD-10-CM | POA: Insufficient documentation

## 2011-08-24 LAB — CBC
MCV: 96.1 fL (ref 78.0–100.0)
Platelets: 206 10*3/uL (ref 150–400)
RBC: 4.58 MIL/uL (ref 3.87–5.11)
WBC: 7.5 10*3/uL (ref 4.0–10.5)

## 2011-08-24 LAB — POCT I-STAT, CHEM 8
BUN: 5 mg/dL — ABNORMAL LOW (ref 6–23)
Calcium, Ion: 1.08 mmol/L — ABNORMAL LOW (ref 1.12–1.32)
Chloride: 102 mEq/L (ref 96–112)
Potassium: 3.8 mEq/L (ref 3.5–5.1)
Sodium: 138 mEq/L (ref 135–145)

## 2011-08-24 LAB — URINALYSIS, ROUTINE W REFLEX MICROSCOPIC
Glucose, UA: NEGATIVE mg/dL
Leukocytes, UA: NEGATIVE
Nitrite: NEGATIVE
Specific Gravity, Urine: 1.01 (ref 1.005–1.030)
pH: 7.5 (ref 5.0–8.0)

## 2011-08-24 LAB — DIFFERENTIAL
Basophils Absolute: 0 10*3/uL (ref 0.0–0.1)
Eosinophils Absolute: 0.2 10*3/uL (ref 0.0–0.7)
Lymphocytes Relative: 26 % (ref 12–46)
Lymphs Abs: 1.9 10*3/uL (ref 0.7–4.0)
Neutrophils Relative %: 65 % (ref 43–77)

## 2011-08-25 ENCOUNTER — Ambulatory Visit (INDEPENDENT_AMBULATORY_CARE_PROVIDER_SITE_OTHER): Payer: Medicare Other | Admitting: Family Medicine

## 2011-08-25 ENCOUNTER — Encounter: Payer: Self-pay | Admitting: Family Medicine

## 2011-08-25 VITALS — BP 149/80 | HR 116 | Temp 97.9°F

## 2011-08-25 DIAGNOSIS — M7989 Other specified soft tissue disorders: Secondary | ICD-10-CM

## 2011-08-25 DIAGNOSIS — F319 Bipolar disorder, unspecified: Secondary | ICD-10-CM

## 2011-08-25 DIAGNOSIS — E119 Type 2 diabetes mellitus without complications: Secondary | ICD-10-CM

## 2011-08-25 DIAGNOSIS — G8929 Other chronic pain: Secondary | ICD-10-CM

## 2011-08-25 LAB — POCT GLYCOSYLATED HEMOGLOBIN (HGB A1C): Hemoglobin A1C: 7.3

## 2011-08-25 MED ORDER — TIOTROPIUM BROMIDE MONOHYDRATE 18 MCG IN CAPS: 18.0000 ug | ORAL_CAPSULE | Freq: Every day | RESPIRATORY_TRACT | Status: AC

## 2011-08-25 MED ORDER — TORSEMIDE 20 MG PO TABS: 20.0000 mg | ORAL_TABLET | Freq: Two times a day (BID) | ORAL | Status: DC

## 2011-08-25 MED ORDER — FLUTICASONE-SALMETEROL 500-50 MCG/DOSE IN AEPB: 1.0000 | INHALATION_SPRAY | Freq: Two times a day (BID) | RESPIRATORY_TRACT | Status: DC

## 2011-08-25 MED ORDER — OXYCODONE HCL 20 MG PO TB12: 20.0000 mg | ORAL_TABLET | Freq: Two times a day (BID) | ORAL | Status: DC | PRN

## 2011-08-25 MED ORDER — POTASSIUM CHLORIDE CRYS ER 10 MEQ PO TBCR: 10.0000 meq | EXTENDED_RELEASE_TABLET | Freq: Two times a day (BID) | ORAL | Status: DC

## 2011-08-25 MED ORDER — HYDROCODONE-ACETAMINOPHEN 7.5-650 MG PO TABS: 1.0000 | ORAL_TABLET | Freq: Two times a day (BID) | ORAL | Status: DC | PRN

## 2011-08-25 MED ORDER — ONDANSETRON HCL 4 MG PO TABS: 4.0000 mg | ORAL_TABLET | Freq: Four times a day (QID) | ORAL | Status: DC | PRN

## 2011-08-25 MED ORDER — POLYETHYLENE GLYCOL 3350 17 GM/SCOOP PO POWD: 17.0000 g | Freq: Every day | ORAL | Status: DC

## 2011-08-25 MED ORDER — OXYCODONE HCL 20 MG PO TB12: 20.0000 mg | ORAL_TABLET | Freq: Two times a day (BID) | ORAL | Status: DC

## 2011-08-25 MED ORDER — BISACODYL 5 MG PO TBEC: 5.0000 mg | DELAYED_RELEASE_TABLET | ORAL | Status: DC | PRN

## 2011-08-25 MED ORDER — ESOMEPRAZOLE MAGNESIUM 40 MG PO CPDR: 40.0000 mg | DELAYED_RELEASE_CAPSULE | Freq: Every day | ORAL | Status: DC

## 2011-08-25 NOTE — Assessment & Plan Note (Signed)
Pt with appointment for psych this month, she will switch her psych meds to that doctor

## 2011-08-25 NOTE — Patient Instructions (Signed)
I refilled your oxycontin and your lorcet for 2 months.  You will be seeing the pain medication doctors before your supply is up and they will take over your management.

## 2011-08-25 NOTE — Progress Notes (Signed)
  Subjective:    Patient ID: [name], female    DOB: 1941-09-28, 70 y.o.   MRN: [id number]  HPI Pt with DM, foot pain, and bipolar, in addition to other complicated medical issues.    DM- taking metformin, changing her diet to have less sugar.  Not checking regularly but when she checks they have been better.  No lows.    Foot pain- ortho has seen her, plans surgical intervention.  She feels she is helped by lorcet.  Has pain clinic referral in, has not scheduled appt yet.  Occasionally is in tears due to pain but still has to assist with some transfers which is very painful  Bipolar-will begin seeing psychiatrist, psychologist soon.  They will start to manage her medications.  Is occasionally tearful but daughter thinks this is better and usually due to pain.   Review of Systems No fevers, HA, N/V    Objective:   Physical Exam Vital signs reviewed General appearance - alert, well appearing, and in no distress  Extremities- DP and PT pulses present bilaterally.  Trace edema with compression hose on.  Sees podiatrist so foot exam deferred due to pt preference        Assessment & Plan:  BIPOLAR DISORDER Pt with appointment for psych this month, she will switch her psych meds to that doctor  DIABETES MELLITUS, TYPE II Will check a1c today.  Pt's sugars have been better with change in diet.  Could consider adding another agent, but in this pt, would want to prevent low blood sugars.  OTHER CHRONIC PAIN Pt with appt with ortho for surgery on her right foot.  She is currently unable to walk and pain is such that she will be in tears through day.  Pt has to do some transfers with assistance and these are very painful.  She is making a pain clinic appt.  i will give 2 months of medication until then.  If she is unable to work with pain clinic, i advised daughter that we would not increase her medicaitons but would continue them on a pain contract.

## 2011-08-25 NOTE — Assessment & Plan Note (Signed)
Pt with appt with ortho for surgery on her right foot.  She is currently unable to walk and pain is such that she will be in tears through day.  Pt has to do some transfers with assistance and these are very painful.  She is making a pain clinic appt.  i will give 2 months of medication until then.  If she is unable to work with pain clinic, i advised daughter that we would not increase her medicaitons but would continue them on a pain contract.

## 2011-08-25 NOTE — Assessment & Plan Note (Signed)
Will check a1c today.  Pt's sugars have been better with change in diet.  Could consider adding another agent, but in this pt, would want to prevent low blood sugars.

## 2011-09-20 IMAGING — CR DG LUMBAR SPINE COMPLETE 4+V
6 series · 6 of 6 positions shown · non-contrast
Comparison: CT dated 09/23/2010

CLINICAL DATA: Low back pain

LUMBAR SPINE - COMPLETE 4+ VIEW

[t l-spine a.p. *]
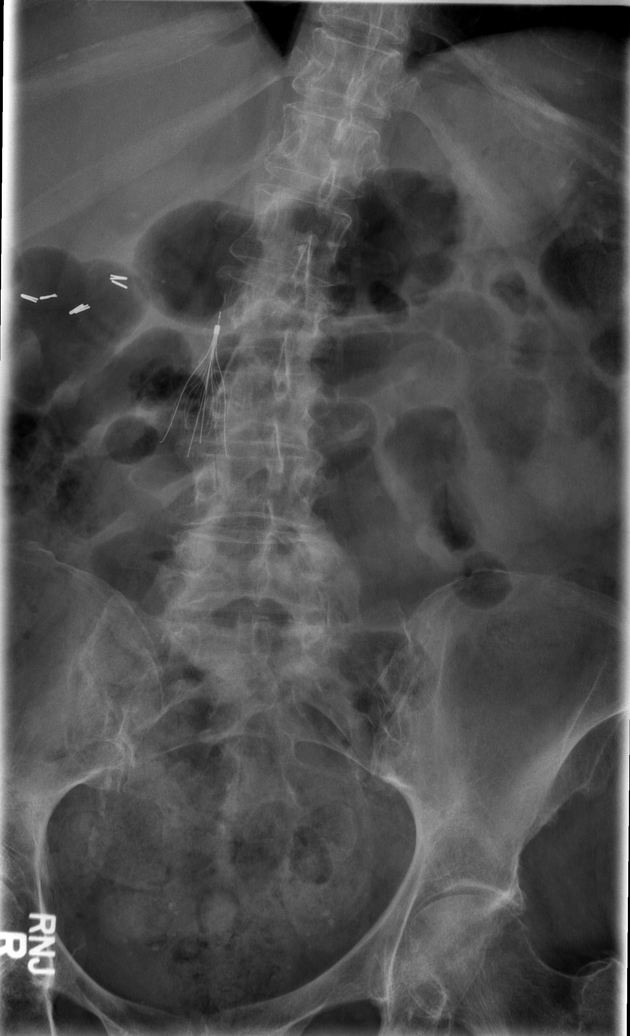

[t l-spine oblique exposure * (1 of 3)]
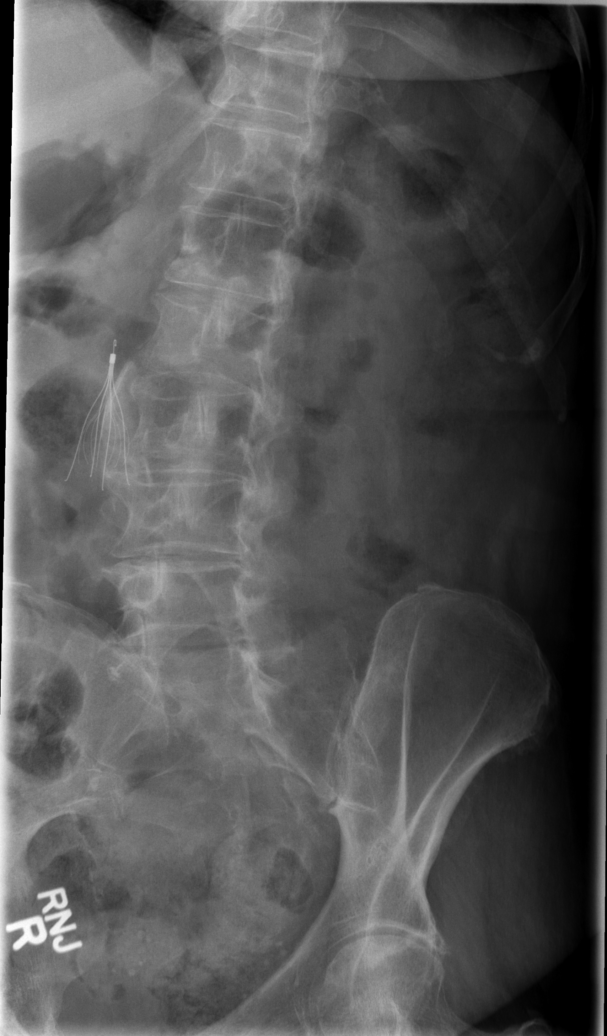

[t l-spine oblique exposure * (2 of 3)]
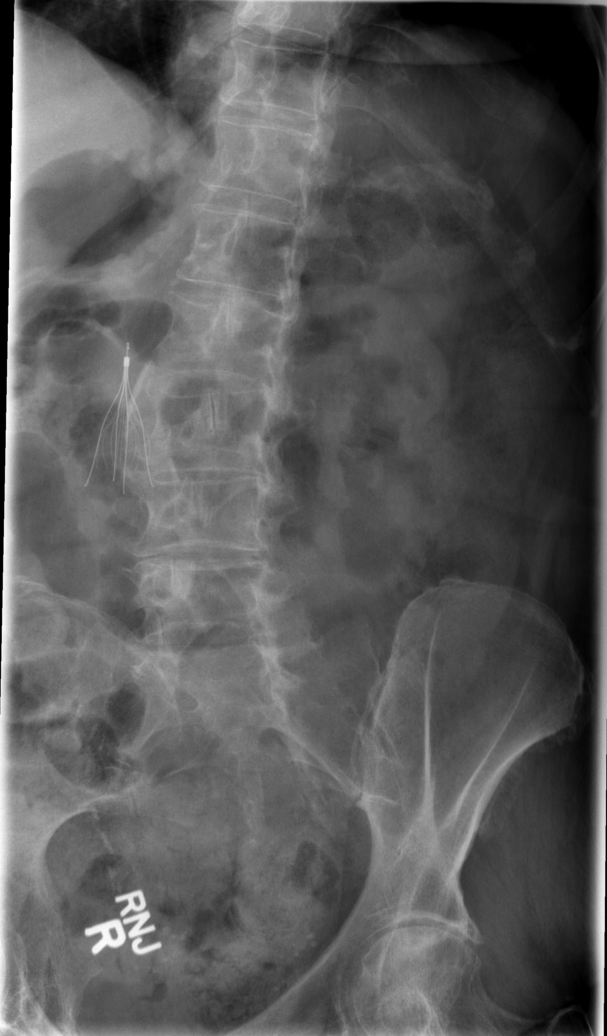

[t l-spine oblique exposure * (3 of 3)]
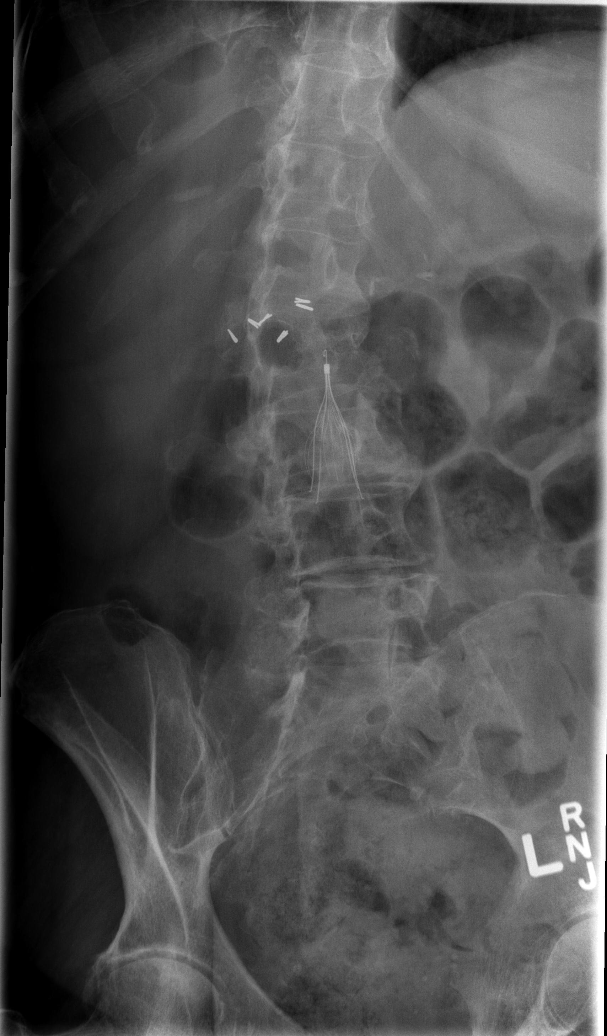

[t l-spine lat]
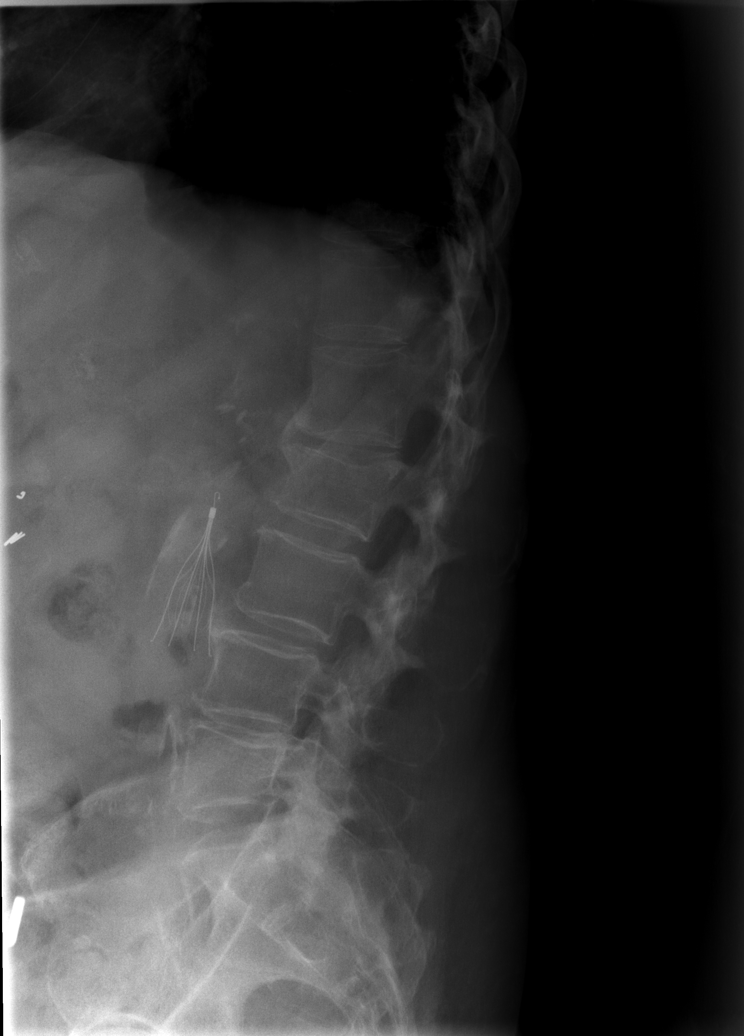

[t l-spine l5-s1 spot]
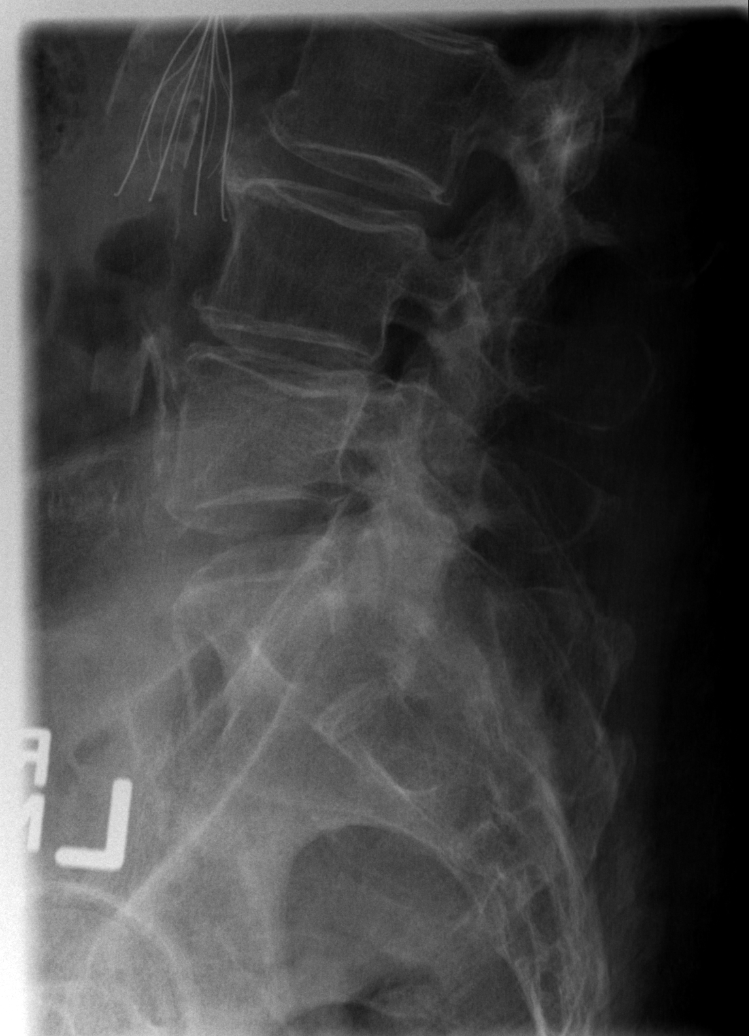

[6 of 6 positions shown; findings below may reference images not displayed]

FINDINGS: There are five lumbar-type vertebral bodies.

No evidence of acute fracture or dislocation.

Multilevel degenerative changes with levocurvature of the lumbar
spine.

Mild anterior compression of the L2 vertebral body, unchanged.
Possible mild posterior loss of height of the L5 vertebral body,
also obtained.

IVC filter.  Surgical clips in the right abdomen.  Vascular
calcifications.
IMPRESSION: No evidence of acute fracture or dislocation.

Stable mild compression of L2 and possibly L5.

Multilevel degenerative changes.

## 2011-09-24 ENCOUNTER — Other Ambulatory Visit: Payer: Self-pay | Admitting: Family Medicine

## 2011-09-24 MED ORDER — NYSTATIN 100000 UNIT/GM EX CREA: TOPICAL_CREAM | Freq: Two times a day (BID) | CUTANEOUS | Status: DC

## 2011-09-25 ENCOUNTER — Other Ambulatory Visit: Payer: Self-pay | Admitting: Family Medicine

## 2011-09-26 NOTE — Telephone Encounter (Signed)
Refill request

## 2011-09-27 ENCOUNTER — Telehealth: Payer: Self-pay | Admitting: Family Medicine

## 2011-09-27 NOTE — Telephone Encounter (Signed)
Pt is asking if Dr Hulen Luster rec'd request from her doctor at [facility] about her upcoming foot surgery.  She is anxious to get this done.

## 2011-09-27 NOTE — Telephone Encounter (Signed)
Will forward to MD, have you seen anything on this patient?

## 2011-09-28 NOTE — Telephone Encounter (Signed)
Patient informed, expressed understanding. 

## 2011-09-28 NOTE — Telephone Encounter (Signed)
I have filled this out and sent in.

## 2011-09-29 ENCOUNTER — Ambulatory Visit (INDEPENDENT_AMBULATORY_CARE_PROVIDER_SITE_OTHER): Payer: Medicare Other

## 2011-09-29 ENCOUNTER — Inpatient Hospital Stay (INDEPENDENT_AMBULATORY_CARE_PROVIDER_SITE_OTHER)
Admission: RE | Admit: 2011-09-29 | Discharge: 2011-09-29 | Disposition: A | Discharge status: Home / Self Care | Payer: Medicare Other | Source: Ambulatory Visit | Attending: Family Medicine | Admitting: Family Medicine

## 2011-09-29 DIAGNOSIS — J40 Bronchitis, not specified as acute or chronic: Secondary | ICD-10-CM

## 2011-10-01 ENCOUNTER — Ambulatory Visit (INDEPENDENT_AMBULATORY_CARE_PROVIDER_SITE_OTHER): Payer: Medicare Other | Admitting: Family Medicine

## 2011-10-01 ENCOUNTER — Encounter: Payer: Self-pay | Admitting: Family Medicine

## 2011-10-01 VITALS — BP 134/82 | HR 118 | Temp 98.3°F | Wt 173.0 lb

## 2011-10-01 DIAGNOSIS — J449 Chronic obstructive pulmonary disease, unspecified: Secondary | ICD-10-CM

## 2011-10-01 DIAGNOSIS — J441 Chronic obstructive pulmonary disease with (acute) exacerbation: Secondary | ICD-10-CM

## 2011-10-01 MED ORDER — PREDNISONE 50 MG PO TABS: 50.0000 mg | ORAL_TABLET | Freq: Every day | ORAL | Status: DC

## 2011-10-01 MED ORDER — IPRATROPIUM BROMIDE 0.02 % IN SOLN: 500.0000 ug | RESPIRATORY_TRACT | Status: DC

## 2011-10-01 MED ORDER — PREDNISONE 20 MG PO TABS
20.0000 mg | ORAL_TABLET | Freq: Once | ORAL | Status: AC
  Administered 2011-10-01: 20 mg via ORAL

## 2011-10-01 MED ORDER — CEFTRIAXONE SODIUM 1 G IJ SOLR
1.0000 g | Freq: Once | INTRAMUSCULAR | Status: AC
  Administered 2011-10-01: 1 g via INTRAMUSCULAR

## 2011-10-01 MED ORDER — ALBUTEROL SULFATE (5 MG/ML) 0.5% IN NEBU
2.5000 mg | INHALATION_SOLUTION | Freq: Once | RESPIRATORY_TRACT | Status: AC
  Administered 2011-10-01: 2.5 mg via RESPIRATORY_TRACT

## 2011-10-01 MED ORDER — CEPHALEXIN 500 MG PO CAPS: 500.0000 mg | ORAL_CAPSULE | Freq: Two times a day (BID) | ORAL | Status: AC

## 2011-10-01 MED ORDER — IPRATROPIUM BROMIDE 0.02 % IN SOLN
0.5000 mg | Freq: Once | RESPIRATORY_TRACT | Status: AC
  Administered 2011-10-01: 0.5 mg via RESPIRATORY_TRACT

## 2011-10-01 MED ORDER — AZITHROMYCIN 500 MG PO TABS: 500.0000 mg | ORAL_TABLET | Freq: Every day | ORAL | Status: AC

## 2011-10-01 NOTE — Progress Notes (Signed)
Addended by: Jone Baseman D on: 10/01/2011 02:17 PM   Modules accepted: Orders

## 2011-10-01 NOTE — Progress Notes (Signed)
  Subjective:    Patient ID: [name], female    DOB: [birth year], 70 y.o.   MRN: [id number]  HPI  Patient presents with daughter after 3 days of cough and chest congestion. Patient denies any fever. She has noticed that she is working harder to breathe. Her cough is productive of yellow sputum. She has increased her oxygen to 3 L per minute. Patient is on Advair and Spiriva for her COPD but she is not compliant with her Spiriva. Patient has had low appetite and low energy for the last 2 days.  Past medical history was reviewed. Patient is former smoker. Review of Systems Patient denies headache, fever, nausea, vomiting, diarrhea    Objective:   Physical Exam Gen.-patient is quieter than usual, however she is alert and interactive. She does not significant pain. HEENT-moist mucous membranes, sclera nonicteric, neck supple. Heart-normal rate and rhythm. No murmur. Lungs-low movement of air in the lower lung fields. No wheezes upper lung fields. No crackles, no rhonchi. Abdomen-soft and nontender. Bowel sounds positive.       Assessment & Plan:

## 2011-10-01 NOTE — Assessment & Plan Note (Signed)
Patient meets criteria for admission with her inability to eat, increased need for oxygen, and increasing feeling of gasping for breath. However her daughter [name] states that she has nursed her through similar episodes at home before. She states that she can meet all her needs at home. I feel that her daughter is very responsible for taking care of her mother, and has a good sense of when her mother will need to return to the ED. After discussion with Dr. Raymondo Band, I will start her on azithromycin 500 mg by mouth daily, Keflex 500 mg by mouth twice a day x7 days, Atrovent and Xopenex nebs at home every 4 hours, and prednisone 50 mg by mouth daily x5 days. While she was here she got an albuterol Atrovent neb with some improvement in her symptoms. I also gave her 60 mg of prednisone and 1 g IM ceftriaxone. Patient is to return on Monday for reevaluation. Patient plans to have surgery on her foot on Thursday, however if her lungs are not improved, it will have to be postponed.

## 2011-10-01 NOTE — Patient Instructions (Signed)
I am sorry you're not feeling well today. I am writing you for 2 different antibiotics in 2 different nebulizer treatments. The nebulizer treatments but I want you to use are the Xopenex and the Atrovent. I sent the Atrovent to your pharmacy. I want you to take nebulizer treatments every 4 hours while you're awake. The antibiotics are azithromycin and Keflex. Azithromycin as once a day for 5 days and Keflex twice a day for 7 days. I want to see you on Monday or Tuesday in clinic. If you start to feel worse and think that breathing is getting too hard, make sure you come to the emergency room or call the clinic.

## 2011-10-04 ENCOUNTER — Encounter: Payer: Self-pay | Admitting: Family Medicine

## 2011-10-04 ENCOUNTER — Ambulatory Visit (INDEPENDENT_AMBULATORY_CARE_PROVIDER_SITE_OTHER): Payer: Medicare Other | Admitting: Family Medicine

## 2011-10-04 ENCOUNTER — Encounter (HOSPITAL_COMMUNITY)
Admission: RE | Admit: 2011-10-04 | Discharge: 2011-10-04 | Disposition: A | Discharge status: Home / Self Care | Payer: Medicare Other | Source: Ambulatory Visit | Attending: Orthopedic Surgery | Admitting: Orthopedic Surgery

## 2011-10-04 DIAGNOSIS — J441 Chronic obstructive pulmonary disease with (acute) exacerbation: Secondary | ICD-10-CM

## 2011-10-04 DIAGNOSIS — Z01818 Encounter for other preprocedural examination: Secondary | ICD-10-CM | POA: Insufficient documentation

## 2011-10-04 LAB — BASIC METABOLIC PANEL
BUN: 10 mg/dL (ref 6–23)
CO2: 22 mEq/L (ref 19–32)
Chloride: 93 mEq/L — ABNORMAL LOW (ref 96–112)
Glucose, Bld: 430 mg/dL — ABNORMAL HIGH (ref 70–99)
Potassium: 3.9 mEq/L (ref 3.5–5.1)

## 2011-10-04 LAB — CBC
HCT: 41.7 % (ref 36.0–46.0)
Hemoglobin: 13.4 g/dL (ref 12.0–15.0)
RBC: 4.39 MIL/uL (ref 3.87–5.11)
WBC: 7.3 10*3/uL (ref 4.0–10.5)

## 2011-10-04 LAB — URINALYSIS, ROUTINE W REFLEX MICROSCOPIC
Glucose, UA: NEGATIVE
Ketones, ur: NEGATIVE
pH: 6.5

## 2011-10-04 NOTE — Progress Notes (Signed)
  Subjective:    Patient ID: [name], female    DOB: 1941-12-16, 70 y.o.   MRN: [id number]  HPI Pt presents for f/u of COPD exacerbation.  Feels much better.  Still using xopenex q6 hours, did not get atrovent.  Taking abx and prednisone.  Sleeping and eating much better.   Pt strongly desires her surgery for her foot and wants it as soon as possible.  We discussed that there would be additional risks which she thinks are worth it.  We also discussed that her anesthesiologist would get the final say.     Review of Systems Denies CP, SOB, HA, N/V/D, fever     Objective:   Physical Exam Vital signs reviewed General appearance - alert, well appearing, and in no distress and oriented to person, place, and time Heart - normal rate, regular rhythm, normal S1, S2, no murmurs, rubs, clicks or gallops Chest - clear to auscultation, no wheezes, nearly normal air movt        Assessment & Plan:  COPD exacerbation Much improved.  No wheeze.  O2 almost back to baseline.  Continue current treatment.  To see anesthesia for final clearance pre surgery.  I think that there is a strong chance she will be at her baseline then, but I warned her the surgery may be postponed.

## 2011-10-04 NOTE — Assessment & Plan Note (Signed)
Much improved.  No wheeze.  O2 almost back to baseline.  Continue current treatment.  To see anesthesia for final clearance pre surgery.  I think that there is a strong chance she will be at her baseline then, but I warned her the surgery may be postponed.

## 2011-10-05 ENCOUNTER — Other Ambulatory Visit (HOSPITAL_COMMUNITY): Payer: Medicare Other

## 2011-10-07 ENCOUNTER — Ambulatory Visit (HOSPITAL_COMMUNITY): Admission: RE | Admit: 2011-10-07 | Payer: Medicare Other | Source: Ambulatory Visit | Admitting: Orthopedic Surgery

## 2011-10-13 ENCOUNTER — Other Ambulatory Visit: Payer: Self-pay | Admitting: Family Medicine

## 2011-10-13 ENCOUNTER — Encounter: Payer: Self-pay | Admitting: Family Medicine

## 2011-10-13 ENCOUNTER — Emergency Department (HOSPITAL_COMMUNITY)
Admission: EM | Admit: 2011-10-13 | Discharge: 2011-10-13 | Disposition: A | Discharge status: Home / Self Care | Payer: Medicare Other | Attending: Emergency Medicine | Admitting: Emergency Medicine

## 2011-10-13 DIAGNOSIS — Z8541 Personal history of malignant neoplasm of cervix uteri: Secondary | ICD-10-CM | POA: Insufficient documentation

## 2011-10-13 DIAGNOSIS — E119 Type 2 diabetes mellitus without complications: Secondary | ICD-10-CM | POA: Insufficient documentation

## 2011-10-13 DIAGNOSIS — I10 Essential (primary) hypertension: Secondary | ICD-10-CM | POA: Insufficient documentation

## 2011-10-13 DIAGNOSIS — J449 Chronic obstructive pulmonary disease, unspecified: Secondary | ICD-10-CM | POA: Insufficient documentation

## 2011-10-13 DIAGNOSIS — Z79899 Other long term (current) drug therapy: Secondary | ICD-10-CM | POA: Insufficient documentation

## 2011-10-13 DIAGNOSIS — K219 Gastro-esophageal reflux disease without esophagitis: Secondary | ICD-10-CM | POA: Insufficient documentation

## 2011-10-13 DIAGNOSIS — J4489 Other specified chronic obstructive pulmonary disease: Secondary | ICD-10-CM | POA: Insufficient documentation

## 2011-10-13 DIAGNOSIS — F319 Bipolar disorder, unspecified: Secondary | ICD-10-CM | POA: Insufficient documentation

## 2011-10-13 DIAGNOSIS — R21 Rash and other nonspecific skin eruption: Secondary | ICD-10-CM | POA: Insufficient documentation

## 2011-10-13 DIAGNOSIS — G8929 Other chronic pain: Secondary | ICD-10-CM | POA: Insufficient documentation

## 2011-10-13 NOTE — Telephone Encounter (Signed)
Refill request

## 2011-10-13 NOTE — Progress Notes (Signed)
Consulted from ED for 2nd opinion on rash.  Patient has had long term issues back through September with erythema and tenderness under her breasts bilaterally and in her inguinal area. Has previously used Triamcinolone and Nystatin but only separately. She has not used nystatin for longer than 2-3 days due to burning. Never used in combination. Also patient has 2 erythematous lesions with central pustules on her right arm and 2 on her stomach suspicious for MRSA. Nurse Practioner Schorr wanted to treat patient with doxycycline but patient reported taking previously (on discussion with patient it had actually been 2 years since taking this for similar lesions which cleared them). Nurse Practioner also thought fungal infection under breast and in inguinal area but unsure about giving to patient due to poor compliance previously. Advised 7 days of Doxycycline for lesions suspicious for MRSA. Advised nystatin/triamcinolone mixture BID after cleaning and drying area due to poor compliance with nystatin alone. Patient agreeable to 2 week course of nystatin/triamcinolone. Has appointment with Dr. Hulen Luster on 10/31.

## 2011-10-20 ENCOUNTER — Ambulatory Visit (INDEPENDENT_AMBULATORY_CARE_PROVIDER_SITE_OTHER): Payer: Medicare Other | Admitting: Family Medicine

## 2011-10-20 ENCOUNTER — Encounter: Payer: Self-pay | Admitting: Family Medicine

## 2011-10-20 VITALS — BP 148/89 | HR 132 | Temp 97.9°F | Ht 60.25 in | Wt 176.0 lb

## 2011-10-20 DIAGNOSIS — F411 Generalized anxiety disorder: Secondary | ICD-10-CM

## 2011-10-20 DIAGNOSIS — B379 Candidiasis, unspecified: Secondary | ICD-10-CM

## 2011-10-20 DIAGNOSIS — Z23 Encounter for immunization: Secondary | ICD-10-CM

## 2011-10-20 DIAGNOSIS — M7989 Other specified soft tissue disorders: Secondary | ICD-10-CM

## 2011-10-20 DIAGNOSIS — J449 Chronic obstructive pulmonary disease, unspecified: Secondary | ICD-10-CM

## 2011-10-20 DIAGNOSIS — G8929 Other chronic pain: Secondary | ICD-10-CM

## 2011-10-20 DIAGNOSIS — J4489 Other specified chronic obstructive pulmonary disease: Secondary | ICD-10-CM

## 2011-10-20 MED ORDER — NYSTATIN-TRIAMCINOLONE 100000-0.1 UNIT/GM-% EX OINT: TOPICAL_OINTMENT | Freq: Two times a day (BID) | CUTANEOUS | Status: DC

## 2011-10-20 MED ORDER — ZOSTER VACCINE LIVE 19400 UNT/0.65ML ~~LOC~~ SOLR: 0.6500 mL | Freq: Once | SUBCUTANEOUS | Status: AC

## 2011-10-20 MED ORDER — OXYCODONE HCL 20 MG PO TB12: 20.0000 mg | ORAL_TABLET | Freq: Two times a day (BID) | ORAL | Status: DC | PRN

## 2011-10-20 MED ORDER — OXYCODONE HCL 20 MG PO TB12: 20.0000 mg | ORAL_TABLET | Freq: Two times a day (BID) | ORAL | Status: DC

## 2011-10-20 MED ORDER — HYDROCODONE-ACETAMINOPHEN 7.5-650 MG PO TABS: 1.0000 | ORAL_TABLET | Freq: Two times a day (BID) | ORAL | Status: DC | PRN

## 2011-10-20 NOTE — Patient Instructions (Addendum)
Please take your torsemide 1 tab twice a day Please take your advair twice a day and your spiriva daily  Please come back in 1 week to see Dr. Katrinka Blazing to recheck your breathing--Wednesday at 4:15 I am refilling your pain medication today

## 2011-10-21 NOTE — Assessment & Plan Note (Signed)
Managed by psychiatry no change

## 2011-10-21 NOTE — Assessment & Plan Note (Signed)
Unable to clear patient for surgery today. She is far better than she was for her last exacerbation. She agrees use her Advair and Spiriva for one week. At that time she will return to see Dr. Katrinka Blazing for recheck. If her lungs sound clear on that day, Dr. Katrinka Blazing will send a letter to her surgeon stating that she is high-risk for intubation with her oxygen-dependent COPD.

## 2011-10-21 NOTE — Assessment & Plan Note (Signed)
Continue triamcinolone-nystatin cream

## 2011-10-21 NOTE — Assessment & Plan Note (Signed)
Will give HER-2 months of pain medicine. Daughter is trying to set patient up at nursing home. Filled out FL2 today. Patient will likely have pain management taking care of at nursing home.

## 2011-10-21 NOTE — Assessment & Plan Note (Signed)
Better than at last check. Patient is currently taking one tablet in the morning and only one half a tablet of Demadex at night. Encouraged her to take her full dose of 1 tablet morning 1 tablet at night. At recheck, will look at her lower extremity swelling

## 2011-10-21 NOTE — Progress Notes (Signed)
  Subjective:    Patient ID: [name], female    DOB: 07/11/1941, 70 y.o.   MRN: [id number]  HPI  COPD-patient with a recent COPD exacerbation. She is currently on her baseline O2 of 3 L. She denies any increase shortness of breath. She denies any cough or sputum production. She denies fevers. She is not using her Advair or Spiriva at this time.  Preventative care-reviewed preventative care with patient. Patient has had a colonoscopy in the past but she does not remember  where was done. She gets mammograms but has not had one in several years she would be willing to get one this year. She is interested in getting the Zostavax.  Lower extremity edema-patient notes that she has increased lower extremity edema. Per caretaker, her daughter, the swelling is about the same. Patient does not keep her feet up. She is always in her wheelchair, and she does not walk at all. She has a IVC filter in place from 2010. She does not have pain or redness in that leg.  Rash-patient with candidal rash below her breasts. Her nystatin made the rash worse. She was given a nystatin-triamcinolone cream in the ED this helped to clear up the rash. She would like to continue using this.  Pain management-daughter has been unable to get an appointment with the pain clinic. Daughter would like [place] to continue managing her pain medicine. She is aware that we'll not increase her pain medicine at all. The pain medicine helps her to tolerate her transfers. She is completely dependent for ADLs. When she's not having her pain medicine, she tends to be tearful and more difficult.  Psychiatric issues-patient has been seeing psychiatrist. He psychiatrist is taken over prescribing her medications.  Review of Systems Denies SOB or CP.  Denies HA or fever    Objective:   Physical Exam Gen.-in wheelchair, alert oriented Heart-regular rate and rhythm, no murmur heard Lungs-small wheeze throughout all lung  fields. Abdomen-nontender nondistended Extremities-compression hose in place. Right greater than left lower extremity swelling 1+       Assessment & Plan:

## 2011-10-24 ENCOUNTER — Other Ambulatory Visit: Payer: Self-pay | Admitting: Family Medicine

## 2011-10-24 NOTE — Telephone Encounter (Signed)
Refill request

## 2011-10-25 ENCOUNTER — Other Ambulatory Visit: Payer: Self-pay | Admitting: Family Medicine

## 2011-10-26 ENCOUNTER — Encounter: Payer: Self-pay | Admitting: Home Health Services

## 2011-10-26 NOTE — Telephone Encounter (Signed)
Refill request

## 2011-10-27 ENCOUNTER — Encounter: Payer: Self-pay | Admitting: Family Medicine

## 2011-10-27 ENCOUNTER — Ambulatory Visit (INDEPENDENT_AMBULATORY_CARE_PROVIDER_SITE_OTHER): Payer: Medicare Other | Admitting: Family Medicine

## 2011-10-27 VITALS — BP 145/48 | HR 52 | Wt 177.0 lb

## 2011-10-27 DIAGNOSIS — Z01818 Encounter for other preprocedural examination: Secondary | ICD-10-CM

## 2011-10-27 MED ORDER — NYSTATIN-TRIAMCINOLONE 100000-0.1 UNIT/GM-% EX OINT: TOPICAL_OINTMENT | Freq: Two times a day (BID) | CUTANEOUS | Status: DC

## 2011-10-27 NOTE — Patient Instructions (Signed)
  I am clearing her for surgery but just a you know you are considered high risk surgical patient. I will send in my note to Dr. Victorino Dike stating that. Good luck and we should see you after surgery to make sure your breathing is doing well

## 2011-10-27 NOTE — Telephone Encounter (Signed)
Refill request

## 2011-10-27 NOTE — Progress Notes (Signed)
Subjective:    [name] is a 70 y.o. female who presents to the office today for a preoperative consultation at the request of surgeon Victorino Dike who plans on performing achilles cord lengthening and posterior tib tendon lengthening on soon. This consultation is requested for the specific conditions prompting preoperative evaluation (i.e. because of potential affect on operative risk): COPD end stage with 2L of oxygen at baseline. Planned anesthesia: unknown but would consider twilight with local . The patient has the following known anesthesia issues: none that she recalls. Patients bleeding risk: no recent abnormal bleeding. Patient does not have objections to receiving blood products if needed.  The following portions of the patient's history were reviewed and updated as appropriate: allergies, current medications, past family history, past medical history, past social history, past surgical history and problem list.  Review of Systems A comprehensive review of systems was negative.  From her baseline.    Objective:    BP 145/48  Pulse 52  Wt 177 lb (80.287 kg) General appearance: alert patient sits in her wheelchair Eyes: negative Throat: lips, mucosa, and tongue normal; teeth and gums normal patient does have dentures Lungs: diminished breath sounds bilaterally but no wheezes rales or rhonchi Heart: regular rate and rhythm, S1, S2 normal, no murmur, click, rub or gallop distant heart sounds Abdomen: soft, non-tender; bowel sounds normal; no masses,  no organomegaly Extremities: On right side patient's foot has extreme contracture and a plantar flex supinated position. Patient also has a large first metatarsal bunion with lateral deviation of the first and second toes. Patient though does have decent dorsalis pedis pulses Pulses: 2+ and symmetric  Patient Active Problem List  Diagnoses  . NEOPLASM UNCERTAIN BEHAVIOR MAJOR SALIV GLANDS  . DIABETES MELLITUS, TYPE II  .  HYPERLIPIDEMIA  . OBESITY, NOS  . ANEMIA-NOS  . BIPOLAR DISORDER  . ANXIETY  . INSOMNIA, CHRONIC  . OTHER CHRONIC PAIN  . HYPERTENSION, BENIGN SYSTEMIC  . PULMONARY EMBOLISM  . COR PULMONALE  . DEEP VENOUS THROMBOPHLEBITIS, LEG, LEFT  . VENOUS INSUFFICIENCY, CHRONIC  . COPD  . PULMONARY NODULE, SOLITARY  . GERD  . OSTEOPENIA  . SYMPTOM, INCONTINENCE, URINARY NOS  . TOBACCO ABUSE, HX OF  . GREENFIELD FILTER INSERTION, HX OF  . WHEELCHAIR DEPENDENCE  . Leg swelling  . Chronic right shoulder pain  . Ankle arthritis  . Candida albicans infection  . COPD exacerbation   Past Surgical History  Procedure Date  . Vena cava filter placement    Current Outpatient Prescriptions on File Prior to Visit  Medication Sig Dispense Refill  . ALPRAZolam (XANAX) 1 MG tablet Take 1 tablet (1 mg total) by mouth 4 (four) times daily. Fill 7/13  124 tablet  0  . bisacodyl (DULCOLAX) 5 MG EC tablet Take 1 tablet (5 mg total) by mouth as needed. Only if no bowel movements in 3 days  30 tablet  11  . Blood Glucose Monitoring Suppl (PRODIGY BLOOD GLUCOSE MONITOR) DEVI Dispense #1 for glucose monitoring. DM, Type II.  ICD 250.00       . Elastic Bandages & Supports (V-4 HIGH COMPRESSION HOSE) MISC 1 Units by Does not apply route daily.  2 each  1  . ENSURE (ENSURE) Take 1 Can by mouth 3 (three) times daily with meals.        Marland Kitchen esomeprazole (NEXIUM) 40 MG capsule Take 1 capsule (40 mg total) by mouth daily.  30 capsule  11  . Fluticasone-Salmeterol (ADVAIR  DISKUS) 500-50 MCG/DOSE AEPB Inhale 1 puff into the lungs 2 (two) times daily.  60 each  11  . gabapentin (NEURONTIN) 600 MG tablet TAKE 1 TABLET BY MOUTH THREE TIMES A DAY AND TAKE 2 BY MOUTH AT BEDTIME  150 tablet  11  . glucose blood (PRODIGY TEST) test strip Check CBG two times a day.  Disp QS.  DM, Type II, ICD 250.00.       . hydrocodone-acetaminophen (LORCET PLUS) 7.5-650 MG per tablet Take 1 tablet by mouth 2 (two) times daily as needed for pain.   60 tablet  1  . metFORMIN (GLUCOPHAGE) 1000 MG tablet Take 1 tablet (1,000 mg total) by mouth 2 (two) times daily with a meal.  64 tablet  11  . NEXIUM 40 MG capsule TAKE ONE CAPSULE BY MOUTH EVERY DAY  30 capsule  1  . nystatin-triamcinolone (MYCOLOG) ointment Apply topically 2 (two) times daily.  30 g  0  . ondansetron (ZOFRAN) 4 MG tablet Take 1 tablet (4 mg total) by mouth every 6 (six) hours as needed.  30 tablet  11  . oxyCODONE (OXYCONTIN) 20 MG 12 hr tablet Take 1 tablet (20 mg total) by mouth every 12 (twelve) hours. Fill 30 days from date on RX  60 tablet  0  . oxyCODONE (OXYCONTIN) 20 MG 12 hr tablet Take 1 tablet (20 mg total) by mouth 2 (two) times daily as needed for pain.  60 tablet  0  . polyethylene glycol powder (GLYCOLAX/MIRALAX) powder Take 17 g by mouth daily.  527 g  11  . potassium chloride (K-DUR,KLOR-CON) 10 MEQ tablet Take 1 tablet (10 mEq total) by mouth 2 (two) times daily.  60 tablet  11  . PRODIGY LANCETS 28G MISC Check CBG two times a day.  Disp QS.  DM, Type II, ICD 250.00.       Marland Kitchen QUEtiapine (SEROQUEL XR) 200 MG 24 hr tablet Take 100 mg by mouth at bedtime.        Marland Kitchen QUEtiapine (SEROQUEL) 25 MG tablet Take 25 mg by mouth at bedtime.        Marland Kitchen tiotropium (SPIRIVA) 18 MCG inhalation capsule Place 1 capsule (18 mcg total) into inhaler and inhale daily.  30 capsule  11  . topiramate (TOPAMAX) 200 MG tablet Take 200 mg by mouth at bedtime.        . torsemide (DEMADEX) 20 MG tablet Take 1 tablet (20 mg total) by mouth 2 (two) times daily.  60 tablet  6  . traZODone (DESYREL) 150 MG tablet Take 450 mg by mouth at bedtime.        . traZODone (DESYREL) 50 MG tablet take one with usual trazadone dose for 7 days, then take 2 with dose for 7 days, then take 3 of 150mg  tabs       . venlafaxine (EFFEXOR-XR) 150 MG 24 hr capsule Take 300 mg by mouth daily.           Predictors of intubation difficulty:  Morbid obesity? no  Anatomically abnormal facies? no  Prominent incisors?  no  Receding mandible? no  Short, thick neck? yes -   Neck range of motion: decreased  Cardiographics ECG: Needed . Echocardiogram: Patient did have recent one done in April please see computer for details  Imaging Chest x-ray:  Enlargement cardiac silhouette with question mildly enlarged  central pulmonary arteries.  No definite acute infiltrate identified.  Underlying COPD changes.   Lab Review  Chemistry      Component Value Date/Time   NA 135 10/04/2011 1642   K 3.9 10/04/2011 1642   CL 93* 10/04/2011 1642   CO2 22 10/04/2011 1642   BUN 10 10/04/2011 1642   CREATININE 0.72 10/04/2011 1642   CREATININE 0.73 03/24/2011 1614      Component Value Date/Time   CALCIUM 9.3 10/04/2011 1642   ALKPHOS 63 03/24/2011 1614   AST 24 03/24/2011 1614   ALT 27 03/24/2011 1614   BILITOT 0.3 03/24/2011 1614     Lab Results  Component Value Date   WBC 7.3 10/04/2011   HGB 13.4 10/04/2011   HCT 41.7 10/04/2011   MCV 95.0 10/04/2011   PLT 203 10/04/2011      Assessment:      70 y.o. female with planned surgery as above.   Known risk factors for perioperative complications: Anemia Chronic pulmonary disease Diabetes mellitus   Difficulty with intubation is anticipated.  Cardiac Risk Estimation: Patient would be considered in high-risk individual but is at optimal medical care for her comorbidities  Current medications which may produce withdrawal symptoms if withheld perioperatively: Beta blockers     Plan:    1. Preoperative workup as follows patient has had most clearance work done but would leave to Dr. Victorino Dike discretion. 2. Change in medication regimen before surgery: none, continue medication regimen including morning of surgery, with sip of water. 3. Prophylaxis for cardiac events with perioperative beta-blockers: should be considered, specific regimen per anesthesia. 4. Invasive hemodynamic monitoring perioperatively: at the discretion of anesthesiologist. 5. Deep vein  thrombosis prophylaxis postoperatively:regimen to be chosen by surgical team. 6. Surveillance for postoperative MI with ECG immediately postoperatively and on postoperative days 1 and 2 AND troponin levels 24 hours postoperatively and on day 4 or hospital discharge (whichever comes first): should be considered. 7. Other measures: Postoperative incentive spirometry to prevent pneumonia. Consultation with Family medicine for in-hospital postoperative management of Other chronic comorbidities diseases.

## 2011-11-01 IMAGING — CR DG CHEST 2V
2 series · 2 of 2 positions shown · non-contrast
Comparison: 02/07/2011

CLINICAL DATA: Cough, congestion, shortness of breath, former
smoker, COPD, emphysema, hypertension, diabetes

CHEST - 2 VIEW

[view not recorded (1 of 2)]
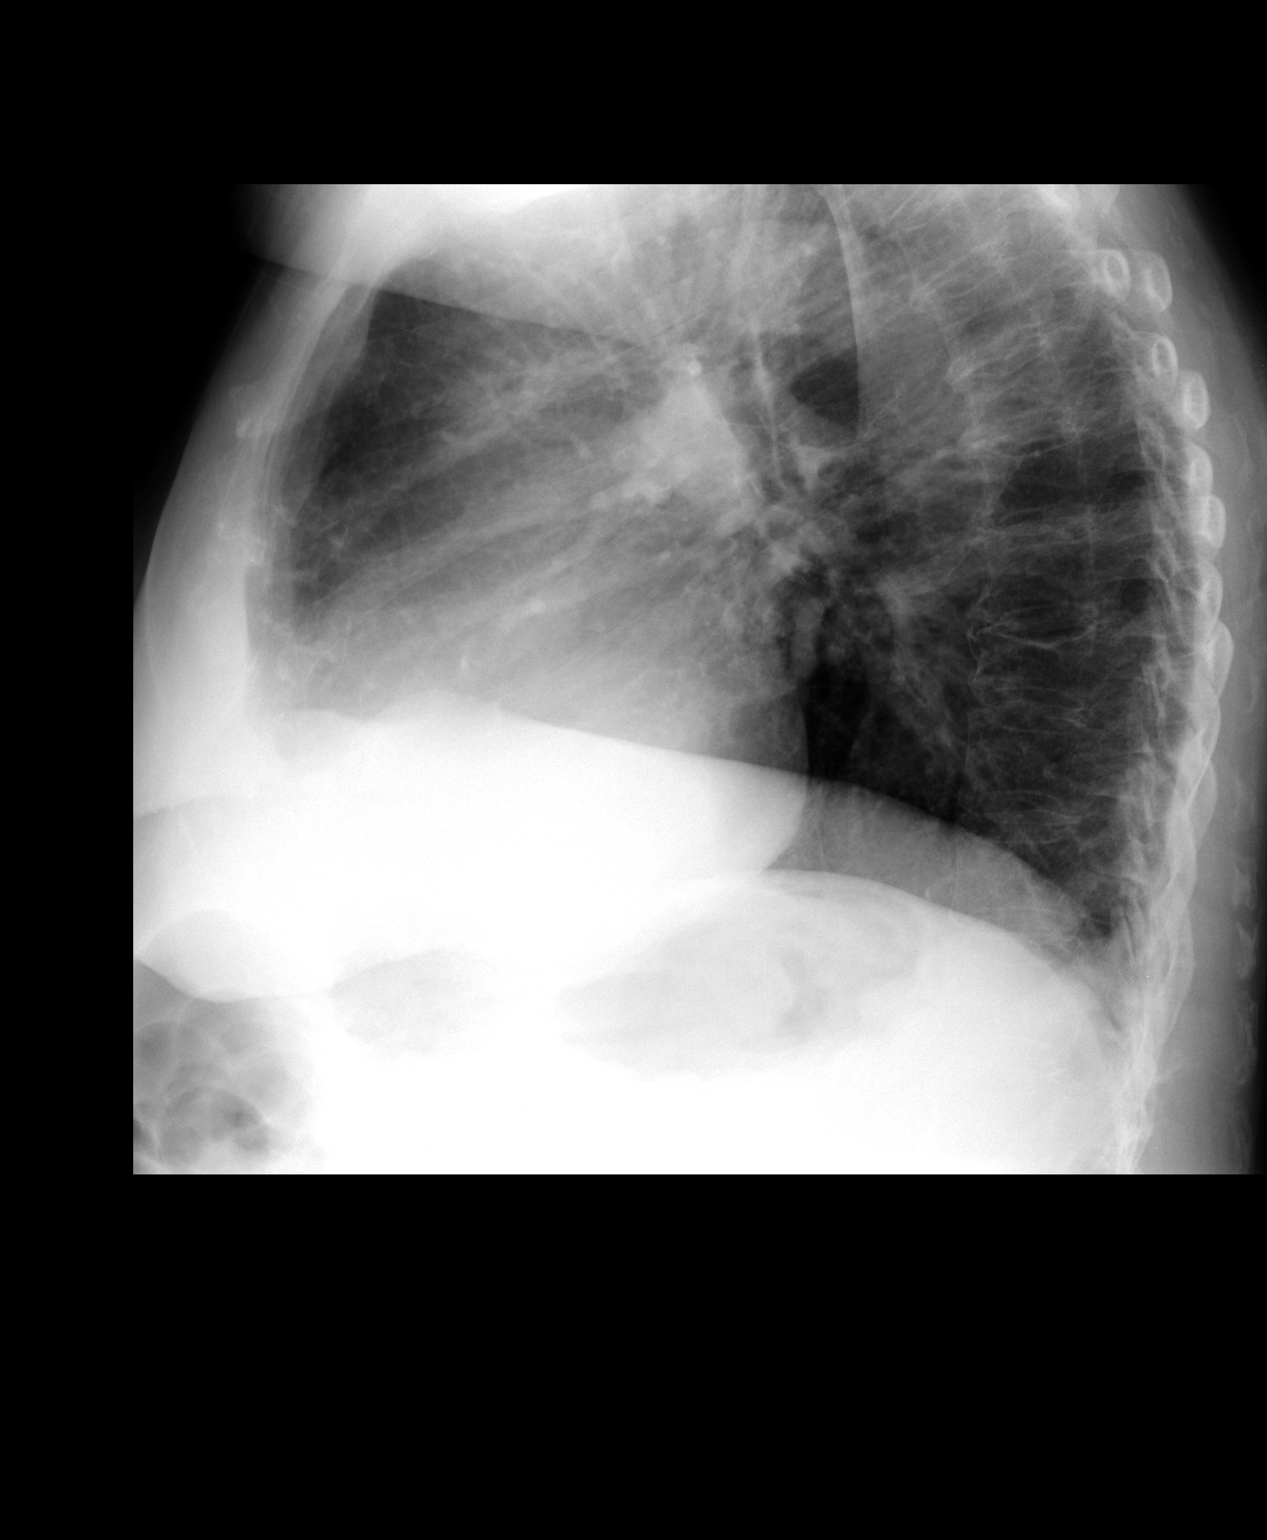

[view not recorded (2 of 2)]
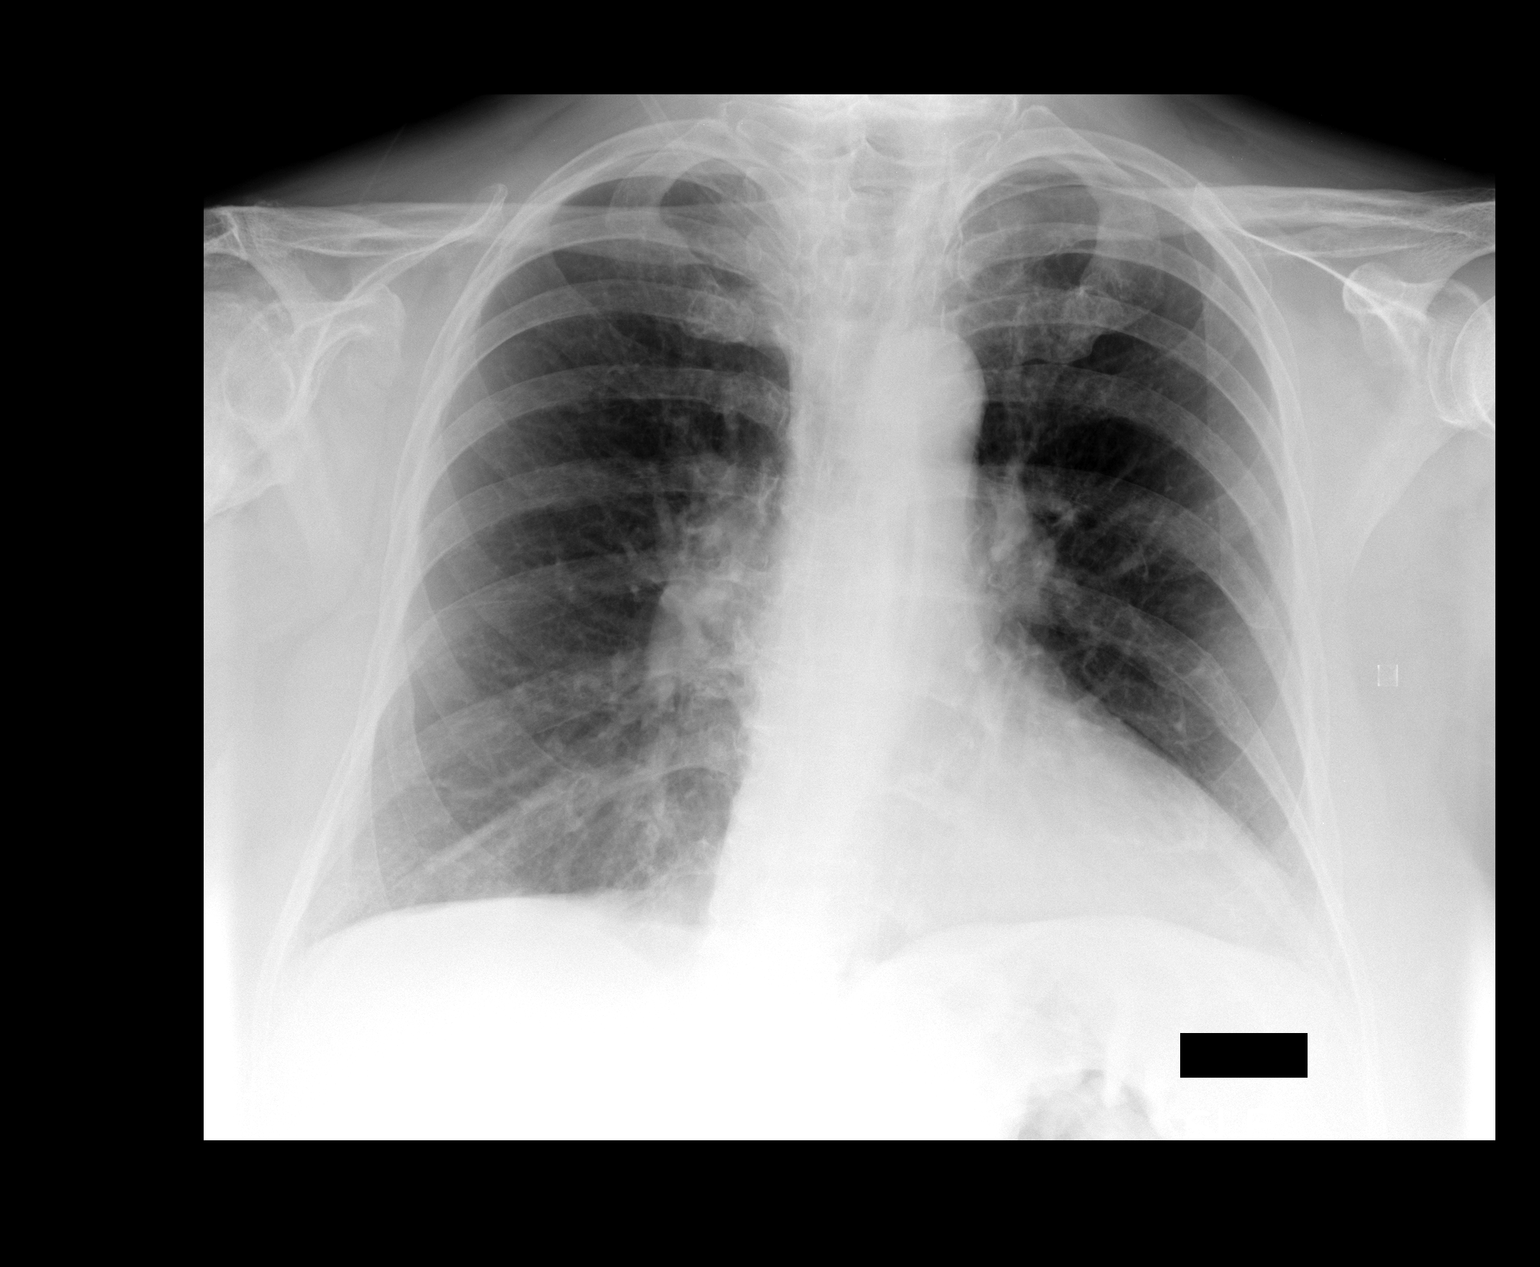

[2 of 2 positions shown; findings below may reference images not displayed]

FINDINGS: Minimally rotated exam.
Enlargement of cardiac silhouette.
Tortuous aorta.
Mild prominence of the hila noted, also present on previous exam,
question enlarged central pulmonary arteries.
No gross failure or consolidation.
Underlying emphysematous changes.
No pneumothorax or pleural effusion.
Bones diffusely demineralized with chronic anterior height loss of
a lower thoracic vertebra.
IMPRESSION: Enlargement cardiac silhouette with question mildly enlarged
central pulmonary arteries.
No definite acute infiltrate identified.
Underlying COPD changes.

## 2011-11-03 ENCOUNTER — Telehealth: Payer: Self-pay | Admitting: Family Medicine

## 2011-11-03 NOTE — Telephone Encounter (Signed)
Note refaxed to number provided

## 2011-11-03 NOTE — Telephone Encounter (Signed)
The last time [name] was seen was by Dr. Katrinka Blazing, she was give a surgical clearance letter, Dr. Angelica Pou office still does not have it.  The fax # is [phone].

## 2011-11-10 ENCOUNTER — Telehealth: Payer: Self-pay | Admitting: Family Medicine

## 2011-11-10 ENCOUNTER — Ambulatory Visit (INDEPENDENT_AMBULATORY_CARE_PROVIDER_SITE_OTHER): Payer: Medicare Other | Admitting: Family Medicine

## 2011-11-10 VITALS — BP 149/88 | HR 80 | Temp 98.2°F

## 2011-11-10 DIAGNOSIS — Z993 Dependence on wheelchair: Secondary | ICD-10-CM

## 2011-11-10 DIAGNOSIS — B379 Candidiasis, unspecified: Secondary | ICD-10-CM

## 2011-11-10 DIAGNOSIS — L089 Local infection of the skin and subcutaneous tissue, unspecified: Secondary | ICD-10-CM

## 2011-11-10 DIAGNOSIS — Z01818 Encounter for other preprocedural examination: Secondary | ICD-10-CM

## 2011-11-10 DIAGNOSIS — B370 Candidal stomatitis: Secondary | ICD-10-CM

## 2011-11-10 MED ORDER — NYSTATIN-TRIAMCINOLONE 100000-0.1 UNIT/GM-% EX CREA: TOPICAL_CREAM | Freq: Four times a day (QID) | CUTANEOUS | Status: DC

## 2011-11-10 MED ORDER — NYSTATIN 100000 UNIT/ML MT SUSP: 500000.0000 [IU] | Freq: Four times a day (QID) | OROMUCOSAL | Status: DC

## 2011-11-10 MED ORDER — CHLORHEXIDINE GLUCONATE 4 % EX LIQD: 60.0000 mL | Freq: Every day | CUTANEOUS | Status: DC | PRN

## 2011-11-10 NOTE — Telephone Encounter (Signed)
[facility] comes in needing multiple FL-2 Forms for placement into nursing home. (at least 6)

## 2011-11-10 NOTE — Patient Instructions (Addendum)
Use nystatin to help with your mouth tenderness.  If you continue to have hoarseness, you need to see your ENT to rule out cancer or growths  Use Hibiclens daily as a soap to clean areas.  Test a small area first to make sur eyour skin is not sensetive to it  I have refilled your mycolog cream

## 2011-11-10 NOTE — Assessment & Plan Note (Signed)
Patient planning on foot surgery Nov 29 and [facility] placed [facility] after this.  Patient asked me to inform her PCP and inquire if FL-2 has been completed.

## 2011-11-10 NOTE — Progress Notes (Signed)
  Subjective:    Patient ID: [name], female    DOB: May 06, 1941, 70 y.o.   MRN: [id number]  HPI 70 yo with multiple medical comorbdities here for evaluate of two weeks of mouth tenderness and hoarseness.  Last month, was placed on antibiotics and steroids for COPD exacerbation.  Has had thrush in the past.  Took one treatment of leftover nystatin which has helped.  Notes soreness with swallowing and hoarseness for 2 weeks.  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  Otherwise notes breathing is at baseline, no increased cough or sputum.  Review of Systemssee HPI     Objective:   Physical Exam GEN: Alert & Oriented, No acute distress Mouth:  Edentulous.  Appears erythematous and with abrasions on tongue and mucosa.  Noulcers or sores.  No evidence of bacterial infection. CV:  Regular Rate & Rhythm, no murmur Respiratory:  Normal work of breathing, CTAB Skin: one small uninfected pustule on abdomen at belt line.  Several small healed likely were pustules on abdomen.       Assessment & Plan:

## 2011-11-10 NOTE — Telephone Encounter (Signed)
Will forward to Dr Hulen Luster

## 2011-11-10 NOTE — Assessment & Plan Note (Signed)
Will treat with nystatin swish and swallow.  Advised if does not fully resolve, very low threshold for ENT follow-up to evaluate hoarseness in the setting of history of leukoplakia.

## 2011-11-10 NOTE — Assessment & Plan Note (Signed)
One isolated pustules, few others healing, no evidence of cellultis.  Advised chlorhexidine wash daily.

## 2011-11-10 NOTE — Assessment & Plan Note (Signed)
Not macerated today.  Will refill mycolog cream instead of ointment at family's request

## 2011-11-15 ENCOUNTER — Encounter (HOSPITAL_COMMUNITY): Payer: Self-pay

## 2011-11-15 ENCOUNTER — Other Ambulatory Visit: Payer: Self-pay

## 2011-11-15 ENCOUNTER — Encounter (HOSPITAL_COMMUNITY)
Admission: RE | Admit: 2011-11-15 | Discharge: 2011-11-15 | Disposition: A | Discharge status: Home / Self Care | Payer: Medicare Other | Source: Ambulatory Visit | Attending: Orthopedic Surgery | Admitting: Orthopedic Surgery

## 2011-11-15 LAB — PROTIME-INR: INR: 0.91 (ref 0.00–1.49)

## 2011-11-15 LAB — CBC
MCV: 97.1 fL (ref 78.0–100.0)
Platelets: 166 10*3/uL (ref 150–400)
RDW: 13.6 % (ref 11.5–15.5)
WBC: 11.3 10*3/uL — ABNORMAL HIGH (ref 4.0–10.5)

## 2011-11-15 LAB — BASIC METABOLIC PANEL
Calcium: 8.6 mg/dL (ref 8.4–10.5)
Creatinine, Ser: 0.46 mg/dL — ABNORMAL LOW (ref 0.50–1.10)
GFR calc Af Amer: >90 mL/min (ref >90)
GFR calc non Af Amer: >90 mL/min (ref >90)

## 2011-11-15 LAB — SURGICAL PCR SCREEN: MRSA, PCR: NEGATIVE

## 2011-11-15 LAB — APTT: aPTT: 28 seconds (ref 24–37)

## 2011-11-15 MED ORDER — CHLORHEXIDINE GLUCONATE 4 % EX LIQD: 60.0000 mL | Freq: Once | CUTANEOUS | Status: DC

## 2011-11-15 NOTE — Pre-Procedure Instructions (Signed)
20 [name]  11/15/2011   Your procedure is scheduled on:  Thursday, November 29TH                                                                                                                                                                 Report to [facility] at  10:45 AM  Call this number if you have problems the morning of surgery: [phone]   Remember:   Do not eat food:After Midnight. Wednesday   May have clear liquids: up to 4 Hours before arrival  UNTIL 6:45AM.  Clear liquids include soda, tea, black coffee, apple or grape juice, broth.   Take these medicines the morning of surgery with A SIP OF WATER:   EFFEXOR, XANAX,              SPIRIVA, NEXIUM, GABAPENTIN, ADVAIR, OXYCONTIN   Do not wear jewelry, make-up or nail polish.  Do not wear lotions, powders, or perfumes. You may wear deodorant.  Do not shave 48 hours prior to surgery.   Do not bring valuables to the hospital .  Contacts, dentures or bridgework may not be worn into surgery.  Leave suitcase in the car. After surgery it may be brought to your room.  For patients admitted to the hospital, checkout time is 11:00 AM the day of discharge.   Patients discharged the day of surgery will not be allowed to drive home.  Name and phone number of your driver:  [name] WRIGHT   DTR.                                                     Special Instructions: CHG Shower Use Special Wash: 1/2 bottle night before surgery and 1/2 bottle morning of surgery.   Please read over the following fact sheets that you were given: Pain Booklet, MRSA Information and Surgical Site Infection Prevention

## 2011-11-17 MED ORDER — CEFAZOLIN SODIUM-DEXTROSE 2-3 GM-% IV SOLR
2.0000 g | INTRAVENOUS | Status: AC
  Administered 2011-11-18: 2 g via INTRAVENOUS
  Filled 2011-11-17: qty 50

## 2011-11-17 NOTE — Telephone Encounter (Signed)
Calling again to say they need this FL2 faxed to [phone] attn: [facility] She is having surgery tomorrow and needs asap

## 2011-11-18 ENCOUNTER — Ambulatory Visit (HOSPITAL_COMMUNITY): Payer: Medicare Other | Admitting: Certified Registered"

## 2011-11-18 ENCOUNTER — Encounter (HOSPITAL_COMMUNITY): Payer: Self-pay | Admitting: *Deleted

## 2011-11-18 ENCOUNTER — Encounter (HOSPITAL_COMMUNITY)
Admission: RE | Disposition: A | Discharge status: Skilled Nursing Facility | Payer: Self-pay | Source: Ambulatory Visit | Attending: Orthopedic Surgery

## 2011-11-18 ENCOUNTER — Encounter (HOSPITAL_COMMUNITY): Payer: Self-pay | Admitting: Certified Registered"

## 2011-11-18 ENCOUNTER — Inpatient Hospital Stay (HOSPITAL_COMMUNITY)
Admission: RE | Admit: 2011-11-18 | Discharge: 2011-11-22 | DRG: 502 | Disposition: A | Discharge status: Skilled Nursing Facility | Payer: Medicare Other | Source: Ambulatory Visit | Attending: Orthopedic Surgery | Admitting: Orthopedic Surgery

## 2011-11-18 DIAGNOSIS — M7989 Other specified soft tissue disorders: Secondary | ICD-10-CM

## 2011-11-18 DIAGNOSIS — F411 Generalized anxiety disorder: Secondary | ICD-10-CM

## 2011-11-18 DIAGNOSIS — M810 Age-related osteoporosis without current pathological fracture: Secondary | ICD-10-CM | POA: Diagnosis present

## 2011-11-18 DIAGNOSIS — E119 Type 2 diabetes mellitus without complications: Secondary | ICD-10-CM | POA: Diagnosis present

## 2011-11-18 DIAGNOSIS — K5900 Constipation, unspecified: Secondary | ICD-10-CM | POA: Diagnosis not present

## 2011-11-18 DIAGNOSIS — F064 Anxiety disorder due to known physiological condition: Secondary | ICD-10-CM | POA: Diagnosis present

## 2011-11-18 DIAGNOSIS — E785 Hyperlipidemia, unspecified: Secondary | ICD-10-CM | POA: Diagnosis present

## 2011-11-18 DIAGNOSIS — M6240 Contracture of muscle, unspecified site: Secondary | ICD-10-CM | POA: Diagnosis present

## 2011-11-18 DIAGNOSIS — Z79899 Other long term (current) drug therapy: Secondary | ICD-10-CM

## 2011-11-18 DIAGNOSIS — B372 Candidiasis of skin and nail: Secondary | ICD-10-CM | POA: Diagnosis not present

## 2011-11-18 DIAGNOSIS — M216X9 Other acquired deformities of unspecified foot: Principal | ICD-10-CM | POA: Diagnosis present

## 2011-11-18 DIAGNOSIS — I890 Lymphedema, not elsewhere classified: Secondary | ICD-10-CM | POA: Diagnosis present

## 2011-11-18 DIAGNOSIS — F319 Bipolar disorder, unspecified: Secondary | ICD-10-CM | POA: Diagnosis present

## 2011-11-18 DIAGNOSIS — Z86718 Personal history of other venous thrombosis and embolism: Secondary | ICD-10-CM

## 2011-11-18 DIAGNOSIS — I10 Essential (primary) hypertension: Secondary | ICD-10-CM | POA: Diagnosis present

## 2011-11-18 DIAGNOSIS — J4489 Other specified chronic obstructive pulmonary disease: Secondary | ICD-10-CM | POA: Diagnosis present

## 2011-11-18 DIAGNOSIS — Z01812 Encounter for preprocedural laboratory examination: Secondary | ICD-10-CM

## 2011-11-18 DIAGNOSIS — K219 Gastro-esophageal reflux disease without esophagitis: Secondary | ICD-10-CM | POA: Diagnosis present

## 2011-11-18 DIAGNOSIS — R269 Unspecified abnormalities of gait and mobility: Secondary | ICD-10-CM

## 2011-11-18 DIAGNOSIS — J449 Chronic obstructive pulmonary disease, unspecified: Secondary | ICD-10-CM | POA: Diagnosis present

## 2011-11-18 DIAGNOSIS — Z87891 Personal history of nicotine dependence: Secondary | ICD-10-CM

## 2011-11-18 DIAGNOSIS — Z86711 Personal history of pulmonary embolism: Secondary | ICD-10-CM

## 2011-11-18 HISTORY — DX: Encounter for other specified aftercare: Z51.89

## 2011-11-18 HISTORY — DX: Reserved for inherently not codable concepts without codable children: IMO0001

## 2011-11-18 HISTORY — DX: Malignant (primary) neoplasm, unspecified: C80.1

## 2011-11-18 HISTORY — DX: Anemia, unspecified: D64.9

## 2011-11-18 HISTORY — PX: ACHILLES TENDON LENGTHENING: SUR826

## 2011-11-18 HISTORY — PX: ACHILLES TENDON SURGERY: SHX542

## 2011-11-18 HISTORY — DX: Pneumonia, unspecified organism: J18.9

## 2011-11-18 HISTORY — DX: Bronchitis, not specified as acute or chronic: J40

## 2011-11-18 SURGERY — REPAIR, TENDON, ACHILLES
Anesthesia: Spinal | Laterality: Right | Wound class: Clean

## 2011-11-18 MED ORDER — QUETIAPINE FUMARATE ER 50 MG PO TB24
150.0000 mg | ORAL_TABLET | Freq: Every day | ORAL | Status: DC
Start: 2011-11-18 — End: 2011-11-22
  Administered 2011-11-18 – 2011-11-21 (×4): 150 mg via ORAL
  Filled 2011-11-18 (×5): qty 3

## 2011-11-18 MED ORDER — PROPOFOL 10 MG/ML IV EMUL
INTRAVENOUS | Status: DC | PRN
  Administered 2011-11-18: 25 ug/kg/min via INTRAVENOUS

## 2011-11-18 MED ORDER — MIDAZOLAM HCL 5 MG/5ML IJ SOLN
INTRAMUSCULAR | Status: DC | PRN
  Administered 2011-11-18 (×2): 1 mg via INTRAVENOUS

## 2011-11-18 MED ORDER — FENTANYL CITRATE 0.05 MG/ML IJ SOLN
INTRAMUSCULAR | Status: DC | PRN
  Administered 2011-11-18 (×2): 50 ug via INTRAVENOUS

## 2011-11-18 MED ORDER — METFORMIN HCL 500 MG PO TABS
1000.0000 mg | ORAL_TABLET | Freq: Two times a day (BID) | ORAL | Status: DC
  Administered 2011-11-18 – 2011-11-22 (×9): 1000 mg via ORAL
  Filled 2011-11-18 (×10): qty 2

## 2011-11-18 MED ORDER — FLUTICASONE-SALMETEROL 500-50 MCG/DOSE IN AEPB
1.0000 | INHALATION_SPRAY | Freq: Two times a day (BID) | RESPIRATORY_TRACT | Status: DC
  Administered 2011-11-18 – 2011-11-22 (×8): 1 via RESPIRATORY_TRACT
  Filled 2011-11-18: qty 14

## 2011-11-18 MED ORDER — SODIUM CHLORIDE 0.9 % IV SOLN
INTRAVENOUS | Status: DC
  Administered 2011-11-19: 02:00:00 via INTRAVENOUS

## 2011-11-18 MED ORDER — BACITRACIN ZINC 500 UNIT/GM EX OINT
TOPICAL_OINTMENT | CUTANEOUS | Status: DC | PRN
  Administered 2011-11-18: 1 via TOPICAL

## 2011-11-18 MED ORDER — TIOTROPIUM BROMIDE MONOHYDRATE 18 MCG IN CAPS
18.0000 ug | ORAL_CAPSULE | Freq: Every day | RESPIRATORY_TRACT | Status: DC
  Administered 2011-11-19 – 2011-11-22 (×4): 18 ug via RESPIRATORY_TRACT
  Filled 2011-11-18: qty 5

## 2011-11-18 MED ORDER — HYDROMORPHONE HCL PF 1 MG/ML IJ SOLN
1.0000 mg | INTRAMUSCULAR | Status: DC | PRN
  Administered 2011-11-18 – 2011-11-21 (×16): 1 mg via INTRAVENOUS
  Filled 2011-11-18 (×17): qty 1

## 2011-11-18 MED ORDER — VENLAFAXINE HCL ER 150 MG PO CP24
300.0000 mg | ORAL_CAPSULE | Freq: Every day | ORAL | Status: DC
  Administered 2011-11-18 – 2011-11-22 (×5): 300 mg via ORAL
  Filled 2011-11-18 (×7): qty 2

## 2011-11-18 MED ORDER — INSULIN ASPART 100 UNIT/ML ~~LOC~~ SOLN
0.0000 [IU] | Freq: Three times a day (TID) | SUBCUTANEOUS | Status: DC
  Administered 2011-11-18: 5 [IU] via SUBCUTANEOUS
  Administered 2011-11-19: 2 [IU] via SUBCUTANEOUS
  Administered 2011-11-19: 3 [IU] via SUBCUTANEOUS
  Administered 2011-11-19 – 2011-11-20 (×2): 5 [IU] via SUBCUTANEOUS
  Administered 2011-11-20 – 2011-11-21 (×3): 3 [IU] via SUBCUTANEOUS
  Administered 2011-11-21: 5 [IU] via SUBCUTANEOUS
  Administered 2011-11-21: 2 [IU] via SUBCUTANEOUS
  Administered 2011-11-22: 3 [IU] via SUBCUTANEOUS
  Administered 2011-11-22: 5 [IU] via SUBCUTANEOUS
  Filled 2011-11-18: qty 3

## 2011-11-18 MED ORDER — PANTOPRAZOLE SODIUM 40 MG PO TBEC
40.0000 mg | DELAYED_RELEASE_TABLET | Freq: Every day | ORAL | Status: DC
  Administered 2011-11-18 – 2011-11-22 (×5): 40 mg via ORAL
  Filled 2011-11-18 (×5): qty 1

## 2011-11-18 MED ORDER — SODIUM CHLORIDE 0.9 % IR SOLN
Status: DC | PRN
  Administered 2011-11-18: 1000 mL

## 2011-11-18 MED ORDER — METOCLOPRAMIDE HCL 5 MG PO TABS
5.0000 mg | ORAL_TABLET | Freq: Three times a day (TID) | ORAL | Status: DC | PRN
  Filled 2011-11-18: qty 2

## 2011-11-18 MED ORDER — TRAZODONE HCL 150 MG PO TABS
300.0000 mg | ORAL_TABLET | Freq: Every day | ORAL | Status: DC
Start: 2011-11-18 — End: 2011-11-22
  Administered 2011-11-18 – 2011-11-21 (×4): 300 mg via ORAL
  Filled 2011-11-18 (×5): qty 2

## 2011-11-18 MED ORDER — TORSEMIDE 10 MG PO TABS
10.0000 mg | ORAL_TABLET | Freq: Two times a day (BID) | ORAL | Status: DC
  Administered 2011-11-18 – 2011-11-21 (×6): 20 mg via ORAL
  Administered 2011-11-21 – 2011-11-22 (×2): 10 mg via ORAL
  Filled 2011-11-18 (×10): qty 2

## 2011-11-18 MED ORDER — BUPIVACAINE IN DEXTROSE 0.75-8.25 % IT SOLN
INTRATHECAL | Status: DC | PRN
  Administered 2011-11-18: 12 mg via INTRATHECAL

## 2011-11-18 MED ORDER — TOPIRAMATE 100 MG PO TABS
200.0000 mg | ORAL_TABLET | Freq: Every day | ORAL | Status: DC
  Administered 2011-11-18 – 2011-11-21 (×4): 200 mg via ORAL
  Filled 2011-11-18 (×5): qty 2

## 2011-11-18 MED ORDER — POTASSIUM CHLORIDE CRYS ER 10 MEQ PO TBCR
5.0000 meq | EXTENDED_RELEASE_TABLET | Freq: Two times a day (BID) | ORAL | Status: DC
  Administered 2011-11-18: 5 meq via ORAL
  Administered 2011-11-19 – 2011-11-22 (×6): 10 meq via ORAL
  Filled 2011-11-18 (×11): qty 1

## 2011-11-18 MED ORDER — LACTATED RINGERS IV SOLN
INTRAVENOUS | Status: DC | PRN
  Administered 2011-11-18: 13:00:00 via INTRAVENOUS

## 2011-11-18 MED ORDER — ONDANSETRON HCL 4 MG PO TABS: 4.0000 mg | ORAL_TABLET | Freq: Four times a day (QID) | ORAL | Status: DC | PRN

## 2011-11-18 MED ORDER — METOCLOPRAMIDE HCL 5 MG/ML IJ SOLN
5.0000 mg | Freq: Three times a day (TID) | INTRAMUSCULAR | Status: DC | PRN
  Filled 2011-11-18: qty 2

## 2011-11-18 MED ORDER — HYDROCODONE-ACETAMINOPHEN 7.5-500 MG/15ML PO SOLN
15.0000 mL | ORAL | Status: DC | PRN
  Administered 2011-11-18 – 2011-11-22 (×16): 15 mL via ORAL
  Filled 2011-11-18 (×16): qty 15

## 2011-11-18 MED ORDER — ONDANSETRON HCL 4 MG/2ML IJ SOLN
4.0000 mg | Freq: Four times a day (QID) | INTRAMUSCULAR | Status: DC | PRN
  Administered 2011-11-18 – 2011-11-21 (×3): 4 mg via INTRAVENOUS
  Filled 2011-11-18 (×3): qty 2

## 2011-11-18 MED ORDER — OXYCODONE HCL 20 MG PO TB12
20.0000 mg | ORAL_TABLET | Freq: Two times a day (BID) | ORAL | Status: DC
  Administered 2011-11-18 – 2011-11-22 (×8): 20 mg via ORAL
  Filled 2011-11-18 (×8): qty 1

## 2011-11-18 MED ORDER — QUETIAPINE FUMARATE 25 MG PO TABS
25.0000 mg | ORAL_TABLET | Freq: Every day | ORAL | Status: DC
  Administered 2011-11-18 – 2011-11-21 (×4): 25 mg via ORAL
  Filled 2011-11-18 (×6): qty 1

## 2011-11-18 MED ORDER — GABAPENTIN 600 MG PO TABS
600.0000 mg | ORAL_TABLET | Freq: Four times a day (QID) | ORAL | Status: DC
  Administered 2011-11-18: 1200 mg via ORAL
  Administered 2011-11-18: 600 mg via ORAL
  Administered 2011-11-19 – 2011-11-20 (×5): 1200 mg via ORAL
  Administered 2011-11-20 (×2): 600 mg via ORAL
  Administered 2011-11-21 – 2011-11-22 (×7): 1200 mg via ORAL
  Filled 2011-11-18 (×19): qty 2

## 2011-11-18 MED ORDER — POLYETHYLENE GLYCOL 3350 17 G PO PACK
17.0000 g | PACK | Freq: Every day | ORAL | Status: DC
  Administered 2011-11-18 – 2011-11-22 (×4): 17 g via ORAL
  Filled 2011-11-18 (×6): qty 1

## 2011-11-18 MED ORDER — ALPRAZOLAM 1 MG PO TABS
1.0000 mg | ORAL_TABLET | Freq: Four times a day (QID) | ORAL | Status: DC
  Administered 2011-11-18 – 2011-11-19 (×4): 1 mg via ORAL
  Administered 2011-11-19: 0.5 mg via ORAL
  Administered 2011-11-20 – 2011-11-21 (×5): 1 mg via ORAL
  Administered 2011-11-21: 08:00:00 via ORAL
  Administered 2011-11-22 (×3): 1 mg via ORAL
  Filled 2011-11-18 (×14): qty 2

## 2011-11-18 MED ORDER — NYSTATIN-TRIAMCINOLONE 100000-0.1 UNIT/GM-% EX CREA
1.0000 "application " | TOPICAL_CREAM | Freq: Four times a day (QID) | CUTANEOUS | Status: DC
  Administered 2011-11-18 – 2011-11-22 (×14): 1 via TOPICAL
  Filled 2011-11-18: qty 15

## 2011-11-18 MED ORDER — SODIUM CHLORIDE 0.9 % IV SOLN: INTRAVENOUS | Status: DC

## 2011-11-18 SURGICAL SUPPLY — 52 items
BANDAGE ACE 4 STERILE (GAUZE/BANDAGES/DRESSINGS) ×1 IMPLANT
BANDAGE ESMARK 6X9 LF (GAUZE/BANDAGES/DRESSINGS) ×1 IMPLANT
BLADE SURG 10 STRL SS (BLADE) ×2 IMPLANT
BNDG CMPR 9X6 STRL LF SNTH (GAUZE/BANDAGES/DRESSINGS) ×1
BNDG COHESIVE 4X5 TAN STRL (GAUZE/BANDAGES/DRESSINGS) ×2 IMPLANT
BNDG COHESIVE 6X5 TAN STRL LF (GAUZE/BANDAGES/DRESSINGS) ×2 IMPLANT
BNDG ESMARK 6X9 LF (GAUZE/BANDAGES/DRESSINGS) ×2
CHLORAPREP W/TINT 26ML (MISCELLANEOUS) ×2 IMPLANT
CLOTH BEACON ORANGE TIMEOUT ST (SAFETY) ×2 IMPLANT
COVER SURGICAL LIGHT HANDLE (MISCELLANEOUS) ×2 IMPLANT
CUFF TOURNIQUET SINGLE 34IN LL (TOURNIQUET CUFF) ×2 IMPLANT
CUFF TOURNIQUET SINGLE 44IN (TOURNIQUET CUFF) IMPLANT
DRAPE C-ARM 42X72 X-RAY (DRAPES) ×1 IMPLANT
DRAPE U-SHAPE 47X51 STRL (DRAPES) ×2 IMPLANT
DRSG ADAPTIC 3X8 NADH LF (GAUZE/BANDAGES/DRESSINGS) ×1 IMPLANT
DRSG PAD ABDOMINAL 8X10 ST (GAUZE/BANDAGES/DRESSINGS) ×1 IMPLANT
ELECT REM PT RETURN 9FT ADLT (ELECTROSURGICAL) ×2
ELECTRODE REM PT RTRN 9FT ADLT (ELECTROSURGICAL) ×1 IMPLANT
GLOVE BIO SURGEON STRL SZ8 (GLOVE) ×2 IMPLANT
GLOVE BIOGEL PI IND STRL 8 (GLOVE) ×1 IMPLANT
GLOVE BIOGEL PI INDICATOR 8 (GLOVE) ×1
GLOVE SURG SS PI 6.5 STRL IVOR (GLOVE) ×1 IMPLANT
GOWN PREVENTION PLUS XLARGE (GOWN DISPOSABLE) ×2 IMPLANT
GOWN STRL NON-REIN LRG LVL3 (GOWN DISPOSABLE) ×4 IMPLANT
KIT BASIN OR (CUSTOM PROCEDURE TRAY) ×2 IMPLANT
KIT ROOM TURNOVER OR (KITS) ×2 IMPLANT
MANIFOLD NEPTUNE II (INSTRUMENTS) ×2 IMPLANT
NEEDLE 22X1 1/2 (OR ONLY) (NEEDLE) IMPLANT
NS IRRIG 1000ML POUR BTL (IV SOLUTION) ×2 IMPLANT
PACK ORTHO EXTREMITY (CUSTOM PROCEDURE TRAY) ×2 IMPLANT
PAD ARMBOARD 7.5X6 YLW CONV (MISCELLANEOUS) ×2 IMPLANT
PAD CAST 4YDX4 CTTN HI CHSV (CAST SUPPLIES) ×1 IMPLANT
PADDING CAST COTTON 4X4 STRL (CAST SUPPLIES) ×2
SOAP 2 % CHG 4 OZ (WOUND CARE) ×2 IMPLANT
SPONGE GAUZE 4X4 12PLY (GAUZE/BANDAGES/DRESSINGS) IMPLANT
SPONGE GAUZE 4X4 STERILE 39 (GAUZE/BANDAGES/DRESSINGS) ×1 IMPLANT
SPONGE LAP 18X18 X RAY DECT (DISPOSABLE) ×2 IMPLANT
STAPLER VISISTAT 35W (STAPLE) IMPLANT
SUCTION FRAZIER TIP 10 FR DISP (SUCTIONS) ×2 IMPLANT
SUT FIBERWIRE #2 38 T-5 BLUE (SUTURE) ×4
SUT MNCRL AB 3-0 PS2 18 (SUTURE) ×1 IMPLANT
SUT PROLENE 3 0 PS 2 (SUTURE) ×2 IMPLANT
SUT VIC AB 2-0 CT1 27 (SUTURE) ×4
SUT VIC AB 2-0 CT1 TAPERPNT 27 (SUTURE) ×2 IMPLANT
SUT VIC AB 3-0 PS2 18 (SUTURE) ×2
SUT VIC AB 3-0 PS2 18XBRD (SUTURE) ×1 IMPLANT
SUTURE FIBERWR #2 38 T-5 BLUE (SUTURE) IMPLANT
SYR CONTROL 10ML LL (SYRINGE) IMPLANT
TOWEL OR 17X24 6PK STRL BLUE (TOWEL DISPOSABLE) ×2 IMPLANT
TOWEL OR 17X26 10 PK STRL BLUE (TOWEL DISPOSABLE) ×2 IMPLANT
TUBE CONNECTING 12X1/4 (SUCTIONS) ×2 IMPLANT
WATER STERILE IRR 1000ML POUR (IV SOLUTION) ×2 IMPLANT

## 2011-11-18 NOTE — Consult Note (Signed)
[name] is an 70 y.o. female.   Chief Complaint: nursing home placement HPI: [name] with a history of anxiety/bipolar affect, COPD, HTN, DM and past history of PE who underwent elective right foot surgery today and who we were asked to consult on for possible nursing home placement.   I saw her after she had her surgery, and she seemed to be doing well.  Her sister was present at the bedside. Her sister is concerned because the patient lives with her daughter, her daughter's husband, and her daughter's son and the sister feels like the patient is not being well taken care of at home.  [name] sister would like her to go to [facility] skilled nursing facility and is in the process of having FL2 forms filled for this.   The patient was in an emotional mood at the time of this interview. She would change her mind when asked about whether she wanted to go to a nursing home.   Past Medical History  Diagnosis Date  . Anxiety   . Arthritis   . COPD (chronic obstructive pulmonary disease)   . GERD (gastroesophageal reflux disease)   . Hyperlipidemia   . Osteoporosis   . Bipolar affect, depressed   . DVT (deep venous thrombosis)   . PE (pulmonary thromboembolism)   . Hypertension      PT DENIES....ON NO  MEDS  . Diabetes mellitus   . No pertinent past medical history     daughter states she has place above buttocks that could be pressure ulcer beginning.    Past Surgical History  Procedure Date  . Vena cava filter placement   . Cholecystectomy   . 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 Tonsillectomy   . Eye surgery     CATARACTS  . Abdominal hysterectomy     SHE SAYS PARTIAL HYSTERECTOMY    No family history on file. Social History:  reports that she quit smoking about 25 years ago. She has never used smokeless tobacco. She reports that she does not drink alcohol or use illicit drugs.  Allergies:  Allergies  Allergen Reactions  .  Sulfamethoxazole W/Trimethoprim Hives  . Zyprexa     Hallucinations and disorientation    Medications Prior to Admission  Medication Dose Route Frequency Provider Last Rate Last Dose  . 0.9 %  sodium chloride infusion   Intravenous Continuous Toni Arthurs, MD      . ALPRAZolam Prudy Feeler) tablet 1 mg  1 mg Oral QID Toni Arthurs, MD      . ceFAZolin (ANCEF) IVPB 2 g/50 mL premix  2 g Intravenous 60 min Pre-Op Toni Arthurs, MD   2 g at 11/18/11 1304  . Fluticasone-Salmeterol (ADVAIR) 500-50 MCG/DOSE inhaler 1 puff  1 puff Inhalation BID Toni Arthurs, MD      . gabapentin (NEURONTIN) tablet 600-1,200 mg  600-1,200 mg Oral QID Toni Arthurs, MD      . HYDROcodone-acetaminophen (LORTAB) 7.5-500 MG/15ML solution 15 mL  15 mL Oral Q4H PRN Toni Arthurs, MD      . HYDROmorphone (DILAUDID) injection 1 mg  1 mg Intravenous Q2H PRN Toni Arthurs, MD      . insulin aspart (novoLOG) injection 0-15 Units  0-15 Units Subcutaneous TID WC Toni Arthurs, MD      . metFORMIN (GLUCOPHAGE) tablet 1,000 mg  1,000 mg Oral BID WC Toni Arthurs, MD      . metoCLOPramide (  REGLAN) tablet 5-10 mg  5-10 mg Oral Q8H PRN Toni Arthurs, MD       Or  . metoCLOPramide (REGLAN) injection 5-10 mg  5-10 mg Intravenous Q8H PRN Toni Arthurs, MD      . nystatin-triamcinolone (MYCOLOG II) cream 1 application  1 application Topical QID Toni Arthurs, MD      . ondansetron [facility]) tablet 4 mg  4 mg Oral Q6H PRN Toni Arthurs, MD       Or  . ondansetron [facility]) injection 4 mg  4 mg Intravenous Q6H PRN Toni Arthurs, MD      . oxyCODONE (OXYCONTIN) 12 hr tablet 20 mg  20 mg Oral Q12H Toni Arthurs, MD      . pantoprazole (PROTONIX) EC tablet 40 mg  40 mg Oral Daily Toni Arthurs, MD      . polyethylene glycol (MIRALAX / GLYCOLAX) packet 17 g  17 g Oral Daily Toni Arthurs, MD      . potassium chloride (K-DUR,KLOR-CON) CR tablet 5-10 mEq  5-10 mEq Oral BID Toni Arthurs, MD      . QUEtiapine (SEROQUEL XR) 24 hr tablet 150 mg  150 mg Oral QHS Toni Arthurs, MD      .  QUEtiapine (SEROQUEL) tablet 25 mg  25 mg Oral QHS Toni Arthurs, MD      . tiotropium [facility]) inhalation capsule 18 mcg  18 mcg Inhalation Daily Toni Arthurs, MD      . topiramate (TOPAMAX) tablet 200 mg  200 mg Oral QHS Toni Arthurs, MD      . torsemide (DEMADEX) tablet 10-20 mg  10-20 mg Oral BID Toni Arthurs, MD      . traZODone (DESYREL) tablet 300 mg  300 mg Oral QHS Toni Arthurs, MD      . venlafaxine (EFFEXOR-XR) 24 hr capsule 300 mg  300 mg Oral Daily Toni Arthurs, MD      . DISCONTD: 0.9 %  sodium chloride infusion   Intravenous Continuous Toni Arthurs, MD      . DISCONTD: bacitracin ointment    PRN Toni Arthurs, MD   1 application at 11/18/11 1347  . DISCONTD: sodium chloride irrigation 0.9 %    PRN Toni Arthurs, MD   1,000 mL at 11/18/11 1230   Medications Prior to Admission  Medication Sig Dispense Refill  . ALPRAZolam (XANAX) 1 MG tablet Take 1 tablet (1 mg total) by mouth 4 (four) times daily. Fill 7/13  124 tablet  0  . esomeprazole (NEXIUM) 40 MG capsule Take 1 capsule (40 mg total) by mouth daily.  30 capsule  11  . Fluticasone-Salmeterol (ADVAIR DISKUS) 500-50 MCG/DOSE AEPB Inhale 1 puff into the lungs 2 (two) times daily.  60 each  11  . metFORMIN (GLUCOPHAGE) 1000 MG tablet Take 1 tablet (1,000 mg total) by mouth 2 (two) times daily with a meal.  64 tablet  11  . nystatin-triamcinolone (MYCOLOG II) cream Apply 1 application topically 4 (four) times daily.        Marland Kitchen oxyCODONE (OXYCONTIN) 20 MG 12 hr tablet Take 1 tablet (20 mg total) by mouth every 12 (twelve) hours. Fill 30 days from date on RX  60 tablet  0  . potassium chloride (K-DUR,KLOR-CON) 10 MEQ tablet Take 5-10 mEq by mouth 2 (two) times daily. Takes in the am and 5 meq at bedtime      . QUEtiapine (SEROQUEL) 25 MG tablet Take 25 mg by mouth at bedtime.        Marland Kitchen  QUEtiapine Fumarate (SEROQUEL XR) 150 MG 24 hr tablet Take 150 mg by mouth at bedtime.        Marland Kitchen tiotropium (SPIRIVA) 18 MCG inhalation capsule Place 1 capsule  (18 mcg total) into inhaler and inhale daily.  30 capsule  11  . topiramate (TOPAMAX) 200 MG tablet Take 200 mg by mouth at bedtime.        . torsemide (DEMADEX) 20 MG tablet Take 10-20 mg by mouth 2 (two) times daily. Take 20 mg (1 tablet)  in the am and 10 mg (half tablet)  in the pm      . trazodone (DESYREL) 300 MG tablet Take 300 mg by mouth at bedtime.        Marland Kitchen venlafaxine (EFFEXOR-XR) 150 MG 24 hr capsule Take 300 mg by mouth daily.       . Blood Glucose Monitoring Suppl (PRODIGY BLOOD GLUCOSE MONITOR) DEVI Dispense #1 for glucose monitoring. DM, Type II.  ICD 250.00       . Elastic Bandages & Supports (V-4 HIGH COMPRESSION HOSE) MISC 1 Units by Does not apply route daily.  2 each  1  . ENSURE (ENSURE) Take 1 Can by mouth 3 (three) times daily with meals.        Marland Kitchen glucose blood (PRODIGY TEST) test strip Check CBG two times a day.  Disp QS.  DM, Type II, ICD 250.00.       Marland Kitchen PRODIGY LANCETS 28G MISC Check CBG two times a day.  Disp QS.  DM, Type II, ICD 250.00.         Results for orders placed during the hospital encounter of 11/18/11 (from the past 48 hour(s))  GLUCOSE, CAPILLARY     Status: Abnormal   Collection Time   11/18/11 10:52 AM      Component Value Range Comment   Glucose-Capillary 223 (*) 70 - 99 (mg/dL)   GLUCOSE, CAPILLARY     Status: Abnormal   Collection Time   11/18/11  2:25 PM      Component Value Range Comment   Glucose-Capillary 180 (*) 70 - 99 (mg/dL)    No results found.  ROS Denies chest pain Denies difficulty breathing Denies abdominal pain Her right foot is hurting her following the surgery. She is complaining of left leg pain that is unchanged from her chronic pain.  Complaining of constipation and needing a laxative  Blood pressure 125/74, pulse 81, temperature 97.2 F (36.2 C), temperature source Oral, resp. rate 23, SpO2 96.00%. Physical Exam  Gen: NAD Psych: engaged, appropriate, fully alert and oriented HEENT: dry MM, no thrush, no teeth CV:  RRR, no m/r/g Pulm: no increased WOB, CTAB, no w/r/r Abd: NABS, soft, NT, distended, no rebound/guarding Ext:    R leg: wrapped with good capillary refill, pink toes   L leg: moderate tenderness throughout without erythema, cords, or swelling; 1+ pre-tibial edema Neuro: no focal deficits  MMSE: could not be fully calculated due to the patient's refusing to write and copy, however, she was fully oriented to time and placed and had good registration/attention and calculation/language/repetition/and able to follow complex commands. The only area she had difficulty was recalling.  Assessment/Plan [name] with a history of anxiety/bipolar affect/chronic pain, COPD, HTN, DM and past history of PE who underwent elective right foot surgery today and who we were asked to consult on for possible nursing home placement following discharge from the hospital.   Psych: anxiety, bipolar disorder, chronic pain -  Continue home Xanax, Neurontin, Seroquel, topiramate, Effexor, trazodone -Pain medication per surgery (patient is on Lorcet and oxycodone at home). Currently getting home meds plus Dilaudid.   Social  -We will need to re-assess whether she wants to go a nursing home once she is less emotional and has had her chronic medications. -We will speak with her PCP tomorrow (she is off today) regarding possible placement  Achilles tendon contracture s/p percutaneous lengthening of Achilles tendon, right tibialis posterior, and flexor digitorum longus -Per surgery. On Ancef.   COPD -Continue home Advair, Spiriva  GI: GERD, constipation -Protonix 40 qd -Miralax 17g qd  HTN -On torsemide at home. Continue with home potassium supplements.  DM -SSI  FEN -Per surgery PPx SCD Dispo Pending clinical improvement and better assessment of social situation. -Will f/u PT/OT recommendations  Code FULL   OH PARK, ANGELA 11/18/2011, 4:15 PM

## 2011-11-18 NOTE — Progress Notes (Signed)
Notified dr Jacklynn Bue of pt drinking coffee with cream 400 am.

## 2011-11-18 NOTE — Preoperative (Signed)
Beta Blockers   Reason not to administer Beta Blockers:Not Applicable 

## 2011-11-18 NOTE — Progress Notes (Signed)
Report given to rebecca slade rn as caregiver 

## 2011-11-18 NOTE — Progress Notes (Signed)
Pts sister remains at bedside,  And daughter is now present.  Pt eating lunch, and more calm.

## 2011-11-18 NOTE — H&P (Signed)
[name] is an 70 y.o. female.   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  She has difficulty walking due to the deformity and desires correction.  She has failed bracing and shoewear modifications.  Past Medical History  Diagnosis Date  . Anxiety   . Arthritis   . COPD (chronic obstructive pulmonary disease)   . GERD (gastroesophageal reflux disease)   . Hyperlipidemia   . Osteoporosis   . Bipolar affect, depressed   . DVT (deep venous thrombosis)   . PE (pulmonary thromboembolism)   . Hypertension      PT DENIES....ON NO  MEDS  . Diabetes mellitus   . No pertinent past medical history     daughter states she has place above buttocks that could be pressure ulcer beginning.    Past Surgical History  Procedure Date  . Vena cava filter placement   . Cholecystectomy   . 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 Tonsillectomy   . Eye surgery     CATARACTS  . Abdominal hysterectomy     SHE SAYS PARTIAL HYSTERECTOMY    No family history on file. Social History:  reports that she quit smoking about 25 years ago. She has never used smokeless tobacco. She reports that she does not drink alcohol or use illicit drugs.  Allergies:  Allergies  Allergen Reactions  . Sulfamethoxazole W/Trimethoprim Hives  . Zyprexa     Hallucinations and disorientation    Medications Prior to Admission  Medication Dose Route Frequency Provider Last Rate Last Dose  . 0.9 %  sodium chloride infusion   Intravenous Continuous Toni Arthurs, MD      . ceFAZolin (ANCEF) IVPB 2 g/50 mL premix  2 g Intravenous 60 min Pre-Op Toni Arthurs, MD      . sodium chloride irrigation 0.9 %    PRN Toni Arthurs, MD   1,000 mL at 11/18/11 1230   Medications Prior to Admission  Medication Sig Dispense Refill  . ALPRAZolam (XANAX) 1 MG tablet Take 1 tablet (1 mg total) by mouth 4 (four) times daily. Fill 7/13  124 tablet  0  .  esomeprazole (NEXIUM) 40 MG capsule Take 1 capsule (40 mg total) by mouth daily.  30 capsule  11  . Fluticasone-Salmeterol (ADVAIR DISKUS) 500-50 MCG/DOSE AEPB Inhale 1 puff into the lungs 2 (two) times daily.  60 each  11  . metFORMIN (GLUCOPHAGE) 1000 MG tablet Take 1 tablet (1,000 mg total) by mouth 2 (two) times daily with a meal.  64 tablet  11  . nystatin-triamcinolone (MYCOLOG II) cream Apply 1 application topically 4 (four) times daily.        Marland Kitchen oxyCODONE (OXYCONTIN) 20 MG 12 hr tablet Take 1 tablet (20 mg total) by mouth every 12 (twelve) hours. Fill 30 days from date on RX  60 tablet  0  . potassium chloride (K-DUR,KLOR-CON) 10 MEQ tablet Take 5-10 mEq by mouth 2 (two) times daily. Takes in the am and 5 meq at bedtime      . QUEtiapine (SEROQUEL) 25 MG tablet Take 25 mg by mouth at bedtime.        Marland Kitchen QUEtiapine Fumarate (SEROQUEL XR) 150 MG 24 hr tablet Take 150 mg by mouth at bedtime.        Marland Kitchen tiotropium (SPIRIVA) 18 MCG inhalation capsule Place 1 capsule (18 mcg total) into inhaler and inhale daily.  30 capsule  11  . topiramate (TOPAMAX) 200 MG tablet Take 200 mg by mouth at bedtime.        . torsemide (DEMADEX) 20 MG tablet Take 10-20 mg by mouth 2 (two) times daily. Take 20 mg (1 tablet)  in the am and 10 mg (half tablet)  in the pm      . trazodone (DESYREL) 300 MG tablet Take 300 mg by mouth at bedtime.        Marland Kitchen venlafaxine (EFFEXOR-XR) 150 MG 24 hr capsule Take 300 mg by mouth daily.       . Blood Glucose Monitoring Suppl (PRODIGY BLOOD GLUCOSE MONITOR) DEVI Dispense #1 for glucose monitoring. DM, Type II.  ICD 250.00       . Elastic Bandages & Supports (V-4 HIGH COMPRESSION HOSE) MISC 1 Units by Does not apply route daily.  2 each  1  . ENSURE (ENSURE) Take 1 Can by mouth 3 (three) times daily with meals.        Marland Kitchen glucose blood (PRODIGY TEST) test strip Check CBG two times a day.  Disp QS.  DM, Type II, ICD 250.00.       Marland Kitchen PRODIGY LANCETS 28G MISC Check CBG two times a day.   Disp QS.  DM, Type II, ICD 250.00.         Results for orders placed during the hospital encounter of 12/06/11 (from the past 48 hour(s))  GLUCOSE, CAPILLARY     Status: Abnormal   Collection Time   December 06, 2011 10:52 AM      Component Value Range Comment   Glucose-Capillary 223 (*) 70 - 99 (mg/dL)    No results found.  ROS  No recent f/c/n/v/wt loss.  Blood pressure 119/72, temperature 99 F (37.2 C), temperature source Oral, resp. rate 18. Physical Exam wn wd female in nad.  Alert.  Oriented x 2.  EOMI.  Mood is labile.  Affect is flat.  Respirations are unlabored.  R foot had healthy skin without significant swelling.  Foot is in a plantar flexed position with a fixed heel cord contracture.  Varus position of hindfoot.  No lymphadenopathy. Brisk cap refill in toes.    Assessment/Plan 1.  Achilles tendon contracture - Perc tendo achilles lengthening. 2.  Varus hindfoot - open lengthening of post tib tendon  I spoke with the patient, her daughter and sister about the proposed treatment.  Given her baseline health and functional status, the goal is to correct the position of her foot to allow her to wear a cam boot or AFO.  This should assist with her ability to walk but will not likely help with her pain which I believe is primarily from neuropathy.  The proposed surgeries should help her foot position without undue risk from a bigger surgery.  They understand the risks of the alternative treatment options and would like to proceed.  They specifically understand risks of bleeding, infection, nerve damage, blood clots, need for additional surgery, amputation, continued pain and death.    Toni Arthurs 2011/12/06, 12:38 PM

## 2011-11-18 NOTE — Progress Notes (Signed)
.  Clinical social worker completed patient psychosocial assessment, please see assessment in patient shadow chart.  .Clinical social worker initiated skilled nursing facility search, see placement note in patient shadow chart.  Pt is open to rehab at [facility], after previously speaking with admissions at facility. Clinical social work continuing to follow to assist with patient dc plans.  Catha Gosselin, Theresia Majors  [phone] .11/18/2011 17:38

## 2011-11-18 NOTE — Brief Op Note (Signed)
11/18/2011  2:13 PM  PATIENT:  [name]  70 y.o. female  PRE-OPERATIVE DIAGNOSIS:  right Cavovarus Foot Deformity,  Right heelcord contracture   POST-OPERATIVE DIAGNOSIS:  same  Procedure(s): Right percutaneous tendoachilles lengthening Open lengthening right tibialis posterior and flexor digitorum longus SURGEON:  Toni Arthurs, MD  ASSISTANT: n/a  ANESTHESIA:   Spinal  EBL:  Minimal  TOURNIQUET:   Total Tourniquet Time Documented: Thigh (Right) - 20 minutes  COMPLICATIONS:  None apparent  DISPOSITION:  Extubated, awake and stable to recovery.  DICTATION ID:  [id number]

## 2011-11-18 NOTE — Anesthesia Preprocedure Evaluation (Addendum)
Anesthesia Evaluation  Patient identified by MRN, date of birth, ID band Patient awake    Reviewed: Allergy & Precautions, NPO status   Airway Mallampati: II  Neck ROM: Full    Dental  (+) Edentulous Upper and Edentulous Lower   Pulmonary COPD clear to auscultation        Cardiovascular hypertension, Regular Normal    Neuro/Psych PSYCHIATRIC DISORDERS Anxiety Depression    GI/Hepatic GERD-  ,  Endo/Other  Diabetes mellitus-  Renal/GU      Musculoskeletal   Abdominal   Peds  Hematology   Anesthesia Other Findings   Reproductive/Obstetrics                           Anesthesia Physical Anesthesia Plan  ASA: III  Anesthesia Plan: Spinal   Post-op Pain Management:    Induction:   Airway Management Planned: Simple Face Mask  Additional Equipment:   Intra-op Plan:   Post-operative Plan:   Informed Consent: I have reviewed the patients History and Physical, chart, labs and discussed the procedure including the risks, benefits and alternatives for the proposed anesthesia with the patient or authorized representative who has indicated his/her understanding and acceptance.     Plan Discussed with: CRNA and Surgeon  Anesthesia Plan Comments:         Anesthesia Quick Evaluation

## 2011-11-18 NOTE — Transfer of Care (Signed)
Immediate Anesthesia Transfer of Care Note  Patient: [name]  Procedure(s) Performed:  ACHILLES TENDON REPAIR - Right Posterior Tibial Tendon Lenghtening and Tendon Achilles Lenghtening   Patient Location: PACU  Anesthesia Type: MAC  Level of Consciousness: awake, alert  and oriented  Airway & Oxygen Therapy: Patient Spontanous Breathing and Patient connected to nasal cannula oxygen  Post-op Assessment: Report given to PACU RN  Post vital signs: Reviewed and stable  Complications: No apparent anesthesia complications

## 2011-11-18 NOTE — Progress Notes (Signed)
Pt very anxious,agitated,demanding to have daughter/sisiter in ,daughter called and here with pt,daughter not here at time,pt has calmed since sister at bedside

## 2011-11-18 NOTE — Anesthesia Procedure Notes (Addendum)
Spinal  Patient location during procedure: OR Start time: 11/18/2011 1:05 PM End time: 11/18/2011 1:15 PM Staffing Anesthesiologist: Ester Rink TERRILL Performed by: anesthesiologist  Preanesthetic Checklist Completed: patient identified, site marked, surgical consent, pre-op evaluation, timeout performed, IV checked and monitors and equipment checked Spinal Block Patient position: sitting Prep: Betadine Patient monitoring: heart rate, cardiac monitor, continuous pulse ox and blood pressure Approach: right paramedian Location: L3-4 Injection technique: single-shot Needle Needle type: Tuohy  Needle gauge: 22 G Needle length: 5 cm Needle insertion depth: 2 cm Assessment Sensory level: T6 Additional Notes Tolerated well. Pt then placed supine.  Procedure Name: MAC Date/Time: 11/18/2011 1:05 PM Performed by: De Nurse Pre-anesthesia Checklist: Patient identified, Emergency Drugs available, Suction available, Patient being monitored and Timeout performed Patient Re-evaluated:Patient Re-evaluated prior to inductionOxygen Delivery Method: Nasal Cannula Intubation Type: IV induction

## 2011-11-18 NOTE — Anesthesia Postprocedure Evaluation (Signed)
  Anesthesia Post-op Note  Patient: [name]  Procedure(s) Performed:  ACHILLES TENDON REPAIR - Right Posterior Tibial Tendon Lenghtening and Tendon Achilles Lenghtening   Patient Location: PACU  Anesthesia Type: Spinal  Level of Consciousness: awake  Airway and Oxygen Therapy: Patient Spontanous Breathing  Post-op Pain: none  Post-op Assessment: Post-op Vital signs reviewed  Post-op Vital Signs: stable  Complications: No apparent anesthesia complications

## 2011-11-19 ENCOUNTER — Encounter (HOSPITAL_COMMUNITY): Payer: Self-pay | Admitting: Orthopedic Surgery

## 2011-11-19 ENCOUNTER — Telehealth: Payer: Self-pay | Admitting: Family Medicine

## 2011-11-19 LAB — GLUCOSE, CAPILLARY
Glucose-Capillary: 265 mg/dL — ABNORMAL HIGH (ref 70–99)
Glucose-Capillary: 239 mg/dL — ABNORMAL HIGH (ref 70–99)
Glucose-Capillary: 228 mg/dL — ABNORMAL HIGH (ref 70–99)

## 2011-11-19 NOTE — Progress Notes (Signed)
PGY-1 Daily Progress Note- Consult Family Medicine Teaching Service Amber M. Hairford, MD Service Pager: [phone]  Subjective: No acute events overnight. Patient complaining of pain this morning.  Objective: Vital signs in last 24 hours: Temp:  [97.2 F (36.2 C)-99.8 F (37.7 C)] 98.6 F (37 C) (11/30 0450) Pulse Rate:  [81-137] 105  (11/30 0450) Resp:  [15-26] 18  (11/30 0450) BP: (102-190)/(65-166) 102/65 mmHg (11/30 0450) SpO2:  [94 %-99 %] 96 % (11/30 0450) Weight change:  Last BM Date: 11/18/11  Intake/Output from previous day: 11/29 0701 - 11/30 0700 In: 1060 [P.O.:360; I.V.:700] Out: 2501 [Urine:2500; Stool:1] Intake/Output this shift:   Physical Exam: General: Sitting in chair, NAD. Talkative HEENT: Atraumatic, normocephalic. Edentulous. Cardio: RRR. No murmur apprectiated Resp: Good effort. CTAB Abd: Soft, nontender Extremities: Post op boot in place Neuro: Grossly intact  Lab Results: No new lab results  Medications:  I have reviewed the patient's current medications. Scheduled:   . ALPRAZolam  1 mg Oral QID  . ceFAZolin (ANCEF) IV  2 g Intravenous 60 min Pre-Op  . Fluticasone-Salmeterol  1 puff Inhalation BID  . gabapentin  600-1,200 mg Oral QID  . insulin aspart  0-15 Units Subcutaneous TID WC  . metFORMIN  1,000 mg Oral BID WC  . nystatin-triamcinolone  1 application Topical QID  . oxyCODONE  20 mg Oral Q12H  . pantoprazole  40 mg Oral Daily  . polyethylene glycol  17 g Oral Daily  . potassium chloride  5-10 mEq Oral BID  . QUEtiapine Fumarate  150 mg Oral QHS  . QUEtiapine  25 mg Oral QHS  . tiotropium  18 mcg Inhalation Daily  . topiramate  200 mg Oral QHS  . torsemide  10-20 mg Oral BID  . trazodone  300 mg Oral QHS  . venlafaxine  300 mg Oral Daily   Continuous:   . sodium chloride 50 mL/hr at 11/19/11 0700  . DISCONTD: sodium chloride     AVW:[id number], HYDROmorphone, metoCLOPramide (REGLAN) injection,  metoCLOPramide, ondansetron (ZOFRAN) IV, ondansetron, DISCONTD: bacitracin, DISCONTD: sodium chloride irrigation  Assessment/Plan: [name] with a history of anxiety/bipolar affect/chronic pain, COPD, HTN, DM and past history of PE who underwent elective right foot surgery on 11/29 1. Achilles tendon contracture s/p percutaneous lengthening of Achilles tendon, right tibialis posterior, and flexor digitorum longus  -Per surgery.  2. Psych: anxiety, bipolar disorder, chronic pain  -Continue home Xanax, Neurontin, Seroquel, topiramate, Effexor, trazodone  -Pain medication per surgery (patient is on Lorcet and oxycodone at home).   3.COPD  - Stable. No SOB -Continue home Advair, Spiriva   4. GERD, constipation  -Protonix 40 qd  -Miralax 17g qd   5. HTN  -Stable, continue to monitor -On torsemide at home. Continue with home potassium supplements.   6. DM  -Continue SSI   7. Social/Dispo  -Patient states this morning that she does not wish to go back to her daughter's house where she is not welcome. She is unsure where she would like to go. CSW is involved in her case. -We agree with placement at SNF, if patient would like to go -We will follow up as an outpatient at our office.  Code FULL   LOS: 1 day   HAIRFORD, AMBER 11/19/2011, 8:46 AM

## 2011-11-19 NOTE — Progress Notes (Signed)
Inpatient Diabetes Program Recommendations  AACE/ADA: New Consensus Statement on Inpatient Glycemic Control (2009)  Target Ranges:  Prepandial:   less than 140 mg/dL      Peak postprandial:   less than 180 mg/dL (1-2 hours)      Critically ill patients:  140 - 180 mg/dL   Reason for Visit: hyperglycemia   Inpatient Diabetes Program Recommendations Insulin - Basal: Lantus 10 units  Note: If fasting CBGs continue greater than 200 consider adding Lantus. Thank you

## 2011-11-19 NOTE — Progress Notes (Signed)
Subjective: 1 Day Post-Op from achilles, tib post and FDL lengthening.  Pt c/o pain with WB.  Wants to keep her foley. Patient reports pain as mild.    Objective: Vital signs in last 24 hours: Temp:  [97.2 F (36.2 C)-99.8 F (37.7 C)] 98.6 F (37 C) (11/30 0450) Pulse Rate:  [81-137] 105  (11/30 0450) Resp:  [15-26] 18  (11/30 0450) BP: (102-190)/(65-166) 102/65 mmHg (11/30 0450) SpO2:  [94 %-99 %] 96 % (11/30 0940) FiO2 (%):  [36 %] 36 % (11/30 0940)  Intake/Output from previous day: 11/29 0701 - 11/30 0700 In: 1060 [P.O.:360; I.V.:700] Out: 2501 [Urine:2500; Stool:1] Intake/Output this shift: Total I/O In: -  Out: 550 [Urine:550]  PE:  Neurovascular intact Cam boot on with foot in plantgrade position.  Dressings dry and intact.  Assessment/Plan: 1 Day Post-Op from above surgery.    I had a long discussion with Dr. Deirdre Priest about the patient.  He declines to take the pt on family practice service and recommends d/c home for outpt placement in a snf.  Unfortunately PT recommends SNF placement acutely for rehab purposes.    I explained to the patient that she'll need to go to a snf at least on a short term basis until she's strong enough to get around on her own.  Apparently she needs to work out her living arrangements at home as well.  She verbalizes agreement with plan to d/c to snf.    SW to continue with SNF arrangements.  From my perspective, she is safe to transfer to snf as soon as a bed is available.  Medical issues to be managed by family practice teaching service.  D/c foley.  Continue PT.  WBAT on RLE.  [name] 11/19/2011, 1:50 PM

## 2011-11-19 NOTE — Telephone Encounter (Signed)
Wants to let Dr. Hulen Luster know that [name] is in the hospital, room 5025.  She had surgery yesterday and they said it went well.  She is wanting to have her go to rehab after the hospitalization so that she can hopefully get back to walking again.  [name] would like Dr. Hulen Luster to visit her, but they are not sure when she will be discharging.

## 2011-11-19 NOTE — Consult Note (Signed)
Family Medicine Teaching Service Attending Consult Note  I interviewed and examined patient  [name] and reviewed their tests and x-rays.  I discussed with Dr.  Madolyn Frieze and reviewed her consult note.  I agree with her assessment and plan.     [name] is status post elective foot surgery   She is medically stable.  She is unsure if she would like to return to her present living situation or go to a SNF.    She would like to discuss with her family.    We will be happy to consult in the hospital for medical issues as long as she needs to stay from a post surgical view point.   If she is discharged to home we will follow her as an outpatient and arrange SNF placement if she desires.  [name] L

## 2011-11-19 NOTE — Progress Notes (Addendum)
Clinical social worker met with pt to discuss dc plans further. Pt is open to participating in short term rehab but also realizes she may need long term care. Pt has been unable to walk and has been living with pt daughter for the past few years. Pt states her goal is to be able to walk again, however she realizes it may be a long process. Pt hopes to dc to [facility] but understands a plan b is also necessary in case [facility] does not have availability when pt is medically stable for dc. Patient agreed to [employer] skilled nursing facility search.  Csw also spoke with patient sister as requested by patient to discuss dc plans who confirmed. CSW attempted to reach pt daughter but unable to leave a message. CSW continuing to follow. .Clinical social worker initiated skilled nursing facility search, see placement note in patient shadow chart. FL2 completed and placed in patient shadow chart for MD signature.   Catha Gosselin, Theresia Majors  [phone] .11/19/2011 16:01

## 2011-11-19 NOTE — Progress Notes (Signed)
FMTS Attending Daily Note: Kristen Levy MD [phone] pager office [phone] I have discussed this patient with the resident and reviewed the assessment and plan as documented above. I agree wit the resident's findings and plan. Patient's medical status is stable.I have discussed  withDr Chambliss  who saw her this morning. Family Medicine is signing off. If you have further  needs for medical issues regarding this patient please call us.

## 2011-11-19 NOTE — Progress Notes (Signed)
Physical Therapy Evaluation Patient Details Name: [name] MRN: [id number] DOB: [birth year] Today's Date: 11/19/2011  Problem List:  Patient Active Problem List  Diagnoses  . NEOPLASM UNCERTAIN BEHAVIOR MAJOR SALIV GLANDS  . DIABETES MELLITUS, TYPE II  . HYPERLIPIDEMIA  . OBESITY, NOS  . ANEMIA-NOS  . BIPOLAR DISORDER  . ANXIETY  . INSOMNIA, CHRONIC  . OTHER CHRONIC PAIN  . HYPERTENSION, BENIGN SYSTEMIC  . PULMONARY EMBOLISM  . COR PULMONALE  . DEEP VENOUS THROMBOPHLEBITIS, LEG, LEFT  . VENOUS INSUFFICIENCY, CHRONIC  . COPD  . PULMONARY NODULE, SOLITARY  . GERD  . OSTEOPENIA  . SYMPTOM, INCONTINENCE, URINARY NOS  . TOBACCO ABUSE, HX OF  . GREENFIELD FILTER INSERTION, HX OF  . Wheelchair dependence  . Leg swelling  . Chronic right shoulder pain  . Ankle arthritis  . Candida albicans infection  . COPD exacerbation  . Thrush  . Skin pustule(s)    Past Medical History:  Past Medical History  Diagnosis Date  . Anxiety   . Arthritis   . COPD (chronic obstructive pulmonary disease)   . GERD (gastroesophageal reflux disease)   . Hyperlipidemia   . Osteoporosis   . DVT (deep venous thrombosis)   . PE (pulmonary thromboembolism)   . Hypertension      PT DENIES....ON NO  MEDS  . Diabetes mellitus   . Cancer 1969    cervical  . Pneumonia     h/o  . Bronchitis     h/o  . Blood transfusion   . Anemia   . Bipolar affect, depressed    Past Surgical History:  Past Surgical History  Procedure Date  . Vena cava filter placement   . 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 Eye surgery     CATARACTS  . Achilles tendon lengthening 11/18/11    and repair w/posterior tibial tendon lengthening; right  foot  . Cholecystectomy   . Abdominal hysterectomy 1969    "womb taken out for cervical cancer"  . Cataract extraction, bilateral ~ 2008  . Tonsillectomy     "as a child"    PT Assessment/Plan/Recommendation PT Assessment Clinical Impression Statement: Pt  presents with a medical diagnosis of achilles lengthening along with the following impairements/deficits and therapy diagnosis listed below. Pt will benefit from skilled PT in the acute care setting in order to maximize functional mobility to d/c to next venue of care PT Recommendation/Assessment: Patient will need skilled PT in the acute care venue PT Problem List: Decreased strength;Decreased range of motion;Decreased activity tolerance;Decreased balance;Decreased mobility;Decreased knowledge of use of DME;Decreased knowledge of precautions;Pain Barriers to Discharge: Inaccessible home environment;Decreased caregiver support Barriers to Discharge Comments: Family requesting pt go to SNF prior to returning home PT Therapy Diagnosis : Difficulty walking;Acute pain PT Plan PT Frequency: Min 4X/week PT Treatment/Interventions: DME instruction;Gait training;Functional mobility training;Therapeutic exercise;Patient/family education;Balance training PT Recommendation Follow Up Recommendations: Skilled nursing facility Equipment Recommended: Defer to next venue PT Goals  Acute Rehab PT Goals PT Goal Formulation: With patient Time For Goal Achievement: 2 weeks Pt will go Supine/Side to Sit: with modified independence PT Goal: Supine/Side to Sit - Progress: Progressing toward goal Pt will go Sit to Supine/Side: with modified independence PT Goal: Sit to Supine/Side - Progress: Progressing toward goal Pt will Transfer Sit to Stand/Stand to Sit: with supervision PT Transfer Goal: Sit to Stand/Stand to Sit - Progress: Progressing toward goal Pt will Transfer Bed to Chair/Chair to Bed: with supervision  PT Transfer Goal: Bed to Chair/Chair to Bed - Progress: Progressing toward goal Pt will Ambulate: 51 - 150 feet;with min assist;with rolling walker PT Goal: Ambulate - Progress: Other (comment) (Unassessed today)  PT Evaluation Precautions/Restrictions  Restrictions Weight Bearing Restrictions:  Yes RLE Weight Bearing: Weight bearing as tolerated Prior Functioning  Home Living Lives With: Daughter Receives Help From: Family Prior Function Level of Independence: Needs assistance with gait;Needs assistance with tranfers (Pt uses RW) Able to Take Stairs?: Yes Driving: No Vocation: Retired Financial risk analyst Arousal/Alertness: Awake/alert Overall Cognitive Status: Appears within functional limits for tasks assessed Orientation Level: Oriented X4 Sensation/Coordination Sensation Light Touch: Appears Intact Extremity Assessment RLE Assessment RLE Assessment: Exceptions to [facility] RLE AROM (degrees) Overall AROM Right Lower Extremity: Deficits;Due to pain (Hip and Knee WFL. Ankle unable to assess due to pain) RLE Strength RLE Overall Strength: Deficits;Due to pain (Hip and Knee >/= 3/5. Ankle unable to assess due to pain) LLE Assessment LLE Assessment: Within Functional Limits Mobility (including Balance) Bed Mobility Bed Mobility: Yes Supine to Sit: 4: Min assist Supine to Sit Details (indicate cue type and reason): VC for proper sequencing and safety. Pt required minimal trunk support and minimal support of the RLE into sitting Sitting - Scoot to Edge of Bed: 3: Mod assist;With rail Sitting - Scoot to [place] of Bed Details (indicate cue type and reason): Increased verbal cueing needed for sequencing and hand placement. Pt required minimal support through draw sheet due to difficulty with scooting. Transfers Transfers: Yes Sit to Stand: 4: Min assist;With upper extremity assist;From bed Sit to Stand Details (indicate cue type and reason): VC for hand placement and safety to RW. Pt required minimal assist for steadying once standing on RW. Stand to Sit: 4: Min assist;With upper extremity assist;To chair/3-in-1 Stand to Sit Details: VC for hand placement and safety. Pt with controlled descent into chair witih minimal assist. Stand Pivot Transfers: 3: Mod assist Stand Pivot  Transfer Details (indicate cue type and reason): VC for sequencing into recliner. Pt required assist to maintain balance and cueing for sequencing to ease pain in RLE and increased WB through UEs Ambulation/Gait Ambulation/Gait: No    Exercise    End of Session PT - End of Session Equipment Utilized During Treatment: Gait belt;Other (comment) (Right boot) Activity Tolerance: Patient limited by pain Patient left: in chair;with call bell in reach Nurse Communication: Mobility status for transfers;Mobility status for ambulation General Behavior During Session: [facility] for tasks performed Cognition: [facility] for tasks performed  Milana Kidney 11/19/2011, 11:09 AM  11/19/2011 Milana Kidney DPT PAGER: [phone] OFFICE: [phone]

## 2011-11-19 NOTE — Progress Notes (Signed)
Occupational Therapy Evaluation Patient Details Name: [name] MRN: [id number] DOB: 05-08-1941 Today's Date: 11/19/2011  Problem List:  Patient Active Problem List  Diagnoses  . NEOPLASM UNCERTAIN BEHAVIOR MAJOR SALIV GLANDS  . DIABETES MELLITUS, TYPE II  . HYPERLIPIDEMIA  . OBESITY, NOS  . ANEMIA-NOS  . BIPOLAR DISORDER  . ANXIETY  . INSOMNIA, CHRONIC  . OTHER CHRONIC PAIN  . HYPERTENSION, BENIGN SYSTEMIC  . PULMONARY EMBOLISM  . COR PULMONALE  . DEEP VENOUS THROMBOPHLEBITIS, LEG, LEFT  . VENOUS INSUFFICIENCY, CHRONIC  . COPD  . PULMONARY NODULE, SOLITARY  . GERD  . OSTEOPENIA  . SYMPTOM, INCONTINENCE, URINARY NOS  . TOBACCO ABUSE, HX OF  . GREENFIELD FILTER INSERTION, HX OF  . Wheelchair dependence  . Leg swelling  . Chronic right shoulder pain  . Ankle arthritis  . Candida albicans infection  . COPD exacerbation  . Thrush  . Skin pustule(s)    Past Medical History:  Past Medical History  Diagnosis Date  . Anxiety   . Arthritis   . COPD (chronic obstructive pulmonary disease)   . GERD (gastroesophageal reflux disease)   . Hyperlipidemia   . Osteoporosis   . DVT (deep venous thrombosis)   . PE (pulmonary thromboembolism)   . Hypertension      PT DENIES....ON NO  MEDS  . Diabetes mellitus   . Cancer 1969    cervical  . Pneumonia     h/o  . Bronchitis     h/o  . Blood transfusion   . Anemia   . Bipolar affect, depressed    Past Surgical History:  Past Surgical History  Procedure Date  . Vena cava filter placement   . 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 Eye surgery     CATARACTS  . Achilles tendon lengthening 11/18/11    and repair w/posterior tibial tendon lengthening; right  foot  . Cholecystectomy   . Abdominal hysterectomy 1969    "womb taken out for cervical cancer"  . Cataract extraction, bilateral ~ 2008  . Tonsillectomy     "as a child"    OT Assessment/Plan/Recommendation OT Assessment Clinical Impression  Statement: decreased balance OT Recommendation/Assessment: Patient will need skilled OT in the acute care venue OT Problem List: Decreased strength;Impaired balance (sitting and/or standing);Decreased activity tolerance;Decreased knowledge of use of DME or AE;Decreased safety awareness;Pain OT Plan OT Frequency: Min 2X/week OT Treatment/Interventions: Self-care/ADL training;DME and/or AE instruction;Energy conservation;Cognitive remediation/compensation;Therapeutic activities;Balance training OT Recommendation Follow Up Recommendations: Skilled nursing facility Equipment Recommended: Defer to next venue Individuals Consulted Consulted and Agree with Results and Recommendations: Patient OT Goals Acute Rehab OT Goals OT Goal Formulation: With patient Time For Goal Achievement: 2 weeks ADL Goals Pt Will Perform Upper Body Bathing: with supervision Pt Will Perform Lower Body Bathing: with min assist;Sit to stand from chair (AE PRN) Pt Will Perform Upper Body Dressing: with supervision;Sit to stand from chair;with adaptive equipment Pt Will Perform Lower Body Dressing: with min assist;with adaptive equipment;Sit to stand from chair Pt Will Transfer to Toilet: with min assist;3-in-1;Stand pivot transfer Pt Will Perform Toileting - Clothing Manipulation: with min assist;Sitting on 3-in-1 or toilet Pt Will Perform Toileting - Hygiene: with min assist;Sitting on 3-in-1 or toilet  OT Evaluation Precautions/Restrictions  Precautions Precautions: Fall Precaution Comments: pt uses oxygen at home  Restrictions Weight Bearing Restrictions: Yes RLE Weight Bearing: Weight bearing as tolerated Prior Functioning Home Living Lives With: Daughter Receives Help From: Family (sister  very involved in care) Type of Home: Other (Comment) (plans to d/c SNF) Prior Function Level of Independence: Needs assistance with gait;Needs assistance with tranfers (using w/c for 1 year) Able to Take Stairs?:  No Driving: No Vocation: Retired ADL ADL Eating/Feeding: Performed;Set up Where Assessed - Eating/Feeding: Bed level Vision/Perception    Cognition Cognition Arousal/Alertness: Awake/alert Overall Cognitive Status: Appears within functional limits for tasks assessed Orientation Level: Oriented X4 Sensation/Coordination Sensation Light Touch: Appears Intact Extremity Assessment RUE Assessment RUE Assessment: Exceptions to [facility] RUE AROM (degrees) Right Shoulder Flexion  0-170: 100 Degrees RUE Strength Right Shoulder Flexion: 3+/5 Right Elbow Flexion: 3+/5 Right Elbow Extension: 3/5 LUE Assessment LUE Assessment: Within Functional Limits LUE Strength Left Shoulder Flexion: 4/5 Left Elbow Flexion: 4/5 Left Elbow Extension: 4/5 Mobility  Bed Mobility Bed Mobility: Yes Supine to Sit: 4: Min assist;With rails;HOB flat (HOB 20%) Supine to Sit Details (indicate cue type and reason): v/c for safety. Pt required bed rail for stabilization of trunk at EOB. Pt required MIN A with RLE with bed mobility and to scoot to eob Sitting - Scoot to Edge of Bed: 3: Mod assist;With rail (required the use of pad to scoot forward) Sitting - Scoot to Edge of Bed Details (indicate cue type and reason): Increased verbal cueing needed for sequencing and hand placement. Pt required minimal support through draw sheet due to difficulty with scooting. Transfers Transfers: Yes Sit to Stand: 4: Min assist;With upper extremity assist;From bed Sit to Stand Details (indicate cue type and reason): v/c for hand placement. Pt reaching for RW and mod tactile cue to use bed surface. Pt required MIN A for static standing balance. pt c/o pain immediately with WBAT. Pt able to static stand min a. Stand to Sit: 4: Min assist;With upper extremity assist;To chair/3-in-1 Stand to Sit Details: pt uncontrolled descend and not utilizing UE to reach for chair. Pt holding onto RW. Exercises   End of Session OT - End of  Session Equipment Utilized During Treatment: Gait belt Activity Tolerance: Patient tolerated treatment well Patient left: in chair;with call bell in reach Nurse Communication: Mobility status for transfers;Mobility status for ambulation General Behavior During Session: [facility] for tasks performed Cognition: [facility] for tasks performed  Next session to address: 3N1 Transfer   Lucile Shutters 11/19/2011, 1:16 PM  Pager: [phone]

## 2011-11-19 NOTE — Op Note (Signed)
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  PROCEDURE: 1. Right percutaneous tendo-Achilles lengthening. 2. Open lengthening of right tibialis posterior tendon. 3. Open lengthening of right flexor digitorum longus tendon.  SURGEON:  Toni Arthurs, MD  ANESTHESIA:  Spinal.  IV FLUIDS:  See anesthesia record.  ESTIMATED BLOOD LOSS:  Minimal.  TOURNIQUET TIME:  20 minutes at 250 mmHg.  COMPLICATIONS:  None apparent.  DISPOSITION:  Extubated, awake, and stable to recovery.  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  She has failed treatment of this foot deformity with bracing, shoe wear modification, and physical therapy.  She presents now for open tibialis posterior and percutaneous tendo-Achilles lengthening to correct this tight heel cord and foot deformity.  She understands the risks and benefits of the procedure as well as the alternative treatment options.  She would like to proceed in spite of risks of bleeding, infection, nerve damage, blood clots, need for additional surgery, continued foot pain, amputation, and death.  PROCEDURE IN DETAIL:  After preoperative consent was obtained and the correct operative site was identified, the patient was brought to the operating room and placed supine on the operating table.  Spinal anesthesia was induced.  Preoperative antibiotics were  administered.  A surgical time-out was taken.  The right lower extremity was prepped and draped in standard sterile fashion with a tourniquet around the thigh. [name] plantar flexion contracture at the ankle was noted to be approximately 60 degrees.  A triple-hemisection percutaneous tendo- Achilles lengthening was performed.  The ankle could then be positioned in neutral dorsiflexion passively.  With the ankle positioned in neutral plantar flexion, the toes were all noted to be flexed rigidly into mallet toe positions.  A medial incision was marked on the leg over the posterior tibial tendon sheath.  The incision was made and sharp dissection was carried down through the skin.  Blunt dissection was carried down through the subcutaneous tissue to the level of posterior tibial tendon sheath.  The sheath was incised.  The posterior tibial tendon was then lengthened in Z fashion and repaired with 2-0 FiberWire. The flexor digitorum longus tendon was also lengthened in Z fashion given the flexion contractures at the toes.  This lengthening allowed the toes to passively correct to neutral as well as bring the hindfoot around the neutral.  The wound was irrigated copiously.  The start the FDL tendon had also been repaired with a 2-0 Vicryl.  The flexor tendon sheath was repaired with 2-0 Monocryl.  Subcutaneous tissue was closed with inverted simple sutures of 2-0 Monocryl.  The skin was closed with a running 3-0 Prolene suture.  Sterile dressings were applied followed by a well-padded compression wrap.  [name] right lower extremity was placed in a Cam walker boot with the ankle in neutral.  The patient was then awakened from anesthesia and transported to recovery in stable condition.  FOLLOWUP PLAN:  The patient will be observed overnight for pain control and for exacerbation of her COPD.  She will have physical therapy and occupational therapy consultation tomorrow.  She will  be discharged to home likely with PT and OT for home health.     Toni Arthurs, MD     JH/MEDQ  D:  11/18/2011  T:  11/18/2011  Job:  [id number]

## 2011-11-19 NOTE — Progress Notes (Deleted)
Clinical social worker completed patient psychosocial assessment. Pt plans to return to [facility] to continue short term rehab. CSW contacted pt son who agreed with dc plans. SNF also informed CSW that pt is ok return when medically stable.  CSW to continue to follow patient to assist with dc plans. Clinical social worker completed patient FL2 for MD signature and placed in pt shadow chart.   Catha Gosselin, Theresia Majors  [phone] .11/19/2011  12:19pm

## 2011-11-19 NOTE — Progress Notes (Signed)
Utilization review completed. Homar Weinkauf, RN, BSN. 11/19/11  

## 2011-11-19 NOTE — Plan of Care (Signed)
Problem: Phase I Progression Outcomes Goal: OOB as tolerated unless otherwise ordered Outcome: Progressing Recommend RN staff total +2 A for chair<>bed transfer. Pt able to pivot to the Lt side.

## 2011-11-20 LAB — GLUCOSE, CAPILLARY: Glucose-Capillary: 194 mg/dL — ABNORMAL HIGH (ref 70–99)

## 2011-11-20 NOTE — Progress Notes (Signed)
Subjective: 2 Days Post-Op Procedure(s) (LRB): ACHILLES TENDON REPAIR (Right) Patient reports pain as mild.    Objective: Vital signs in last 24 hours: Temp:  [97.2 F (36.2 C)-98.4 F (36.9 C)] 98 F (36.7 C) (12/01 0541) Pulse Rate:  [81-124] 124  (12/01 0808) Resp:  [18-20] 18  (12/01 0808) BP: (114-130)/(71-77) 114/74 mmHg (12/01 0541) SpO2:  [95 %-100 %] 96 % (12/01 0808) FiO2 (%):  [36 %] 36 % (11/30 0940)  Intake/Output from previous day: 11/30 0701 - 12/01 0700 In: -  Out: 1200 [Urine:1200] Intake/Output this shift:    No results found for this basename: HGB:5 in the last 72 hours No results found for this basename: WBC:2,RBC:2,HCT:2,PLT:2 in the last 72 hours No results found for this basename: NA:2,K:2,CL:2,CO2:2,BUN:2,CREATININE:2,GLUCOSE:2,CALCIUM:2 in the last 72 hours No results found for this basename: LABPT:2,INR:2 in the last 72 hours Toes Warm Sensation intact distally Dressing and Cam walker intact  Assessment/Plan: 2 Days Post-Op Procedure(s) (LRB): ACHILLES TENDON REPAIR (Right) Continue PT/OT Discharge to SNF  Keiasha Diep A 11/20/2011, 8:40 AM

## 2011-11-20 NOTE — Progress Notes (Signed)
CSW provided b/o to Pt.  Pt to discuss with family.  Pt primary choice has not yet made offer and Pt is hopeful they will. Milus Banister MSW,LCSW w/e Coverage [phone]

## 2011-11-21 NOTE — Progress Notes (Signed)
Subjective: Procedure(s) (LRB): ACHILLES TENDON REPAIR (Right) 3 Days Post-Op  Patient reports pain as 5 on 0-10 scale.  Positive void Positive bowel movement Positive flatus Negative chest pain or shortness of breath  Objective: Vital signs in last 24 hours: Temp:  [97.9 F (36.6 C)-98.8 F (37.1 C)] 98 F (36.7 C) (12/02 0615) Pulse Rate:  [124-130] 124  (12/02 0615) Resp:  [18-20] 18  (12/02 0615) BP: (111-126)/(68-80) 121/79 mmHg (12/02 0615) SpO2:  [93 %-97 %] 94 % (12/02 0902)  Intake/Output from previous day: 12/01 0701 - 12/02 0700 In: 3190 [P.O.:1040; I.V.:2150] Out: 801 [Urine:800; Stool:1]  No results found for this basename: WBC:2,RBC:2,HCT:2,PLT:2 in the last 72 hours No results found for this basename: NA:2,K:2,CL:2,CO2:2,BUN:2,CREATININE:2,GLUCOSE:2,CALCIUM:2 in the last 72 hours No results found for this basename: LABPT:2,INR:2 in the last 72 hours  ABD soft Neurovascular intact Sensation intact distally Compartment soft Dressing intact Moves toes Assessment/Plan: Patient stable  Continue PT/OT per protocol Plan at this time is to DC to SNF tomorrow pending bed placement.  This was the decision of the family and the patient per the nurse.  However, the patient questions whether or not she would qualify for rehab here at the hospital.  Per social work therapy recommends SNF at discharge. Continue care until discharge; likely tomorrow   Gwinda Maine 11/21/2011, 11:00 AM

## 2011-11-21 NOTE — Progress Notes (Signed)
Nsg note :  Pt's dtr called to mention that pt had fell onto a commode at home that resulted in abrasion or either may have been a  pressure ulcer prior to admission to the coccyx on either 11/07/11 or 11/11/11.  When assessing the site, a partial thickness is noted with no draining or odor, does not appeared tunneling or infected.  Area is cleaned and mepiliex applied. Will keep pt turned and continuously monitor

## 2011-11-22 LAB — GLUCOSE, CAPILLARY: Glucose-Capillary: 190 mg/dL — ABNORMAL HIGH (ref 70–99)

## 2011-11-22 MED ORDER — HYDROCODONE-ACETAMINOPHEN 7.5-325 MG PO TABS: 1.0000 | ORAL_TABLET | Freq: Four times a day (QID) | ORAL | Status: AC | PRN

## 2011-11-22 MED ORDER — ALPRAZOLAM 1 MG PO TABS: 1.0000 mg | ORAL_TABLET | Freq: Four times a day (QID) | ORAL | Status: DC
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
## 2011-11-22 NOTE — Progress Notes (Signed)
Patient having episodes of anxiety throughout day concerned with pain and family dynamics. Patient crying out at times and vicodin elixir 15ml given at 1221 and 1603 due to report of severe pain and unknown time of discharge . Reviewed with patient and sister diabetic management and how to care for stage II on coccycx area wound bed yellow in color extra protective barrier cream applied prn and reviewed how to boost and reposition patient prn to help circulation . Patient and sister reported " we will be ok" . EMS arrived and discharged patient at 66.                                                                                                                           Cleotilde Neer

## 2011-11-22 NOTE — Progress Notes (Signed)
Clinical social worker informed pt requested non ambulance transportation. Pt transportation provided by ptar with chart copy. No further csw needs, signing off.   Catha Gosselin, Theresia Majors  [phone] .11/22/2011 16:16pm

## 2011-11-22 NOTE — Progress Notes (Signed)
Daily Progress Note [name]. [name], M.D., M.B.A  Family Medicine PGY-1 Pager [phone]  Subjective: [name] is on post-op day 4 who needs medical management while awaiting SNF placement  This morning she complains for right foot pain. She notes a rash under her breast and in the anterior flexor surfaces of her of her upper leg, which she believes is improving with medication. Additionally, she denies cough or SOB. She is anxious regarding placement in a SNF.  She requests that Xanax be scheduled for 6:00 AM from 10:00 AM for better anxiety coverage   Objective: Vital signs in last 24 hours: Temp:  [98.7 F (37.1 C)-99.2 F (37.3 C)] 98.7 F (37.1 C) (12/03 0638) Pulse Rate:  [115-143] 143  (12/03 0638) Resp:  [18-24] 24  (12/03 1914) BP: (120-129)/(76-82) 120/82 mmHg (12/03 7829) SpO2:  [91 %-94 %] 91 % (12/03 5621) Weight change:  Last BM Date: 11/20/11  Intake/Output from previous day: 12/02 0701 - 12/03 0700 In: 1680 [P.O.:480; I.V.:1200] Out: -  Intake/Output this shift:    General: alert, oriented, moderate distress HEENT: NCAT, edentulous, nasal cannula in place on 2L O2 Cardiac: RRR, no murmurs Lungs: CTA- B, no wheezes, rhonchi or rales, symmetric air movement bilaterally Skin: candidal rash under breasts bilaterally and intertriginous areas of legs  Psych: tearful; anxious  Lab Results: No results found for this basename: WBC:2,HGB:2,HCT:2,PLT:2 in the last 72 hours BMET No results found for this basename: NA:2,K:2,CL:2,CO2:2,GLUCOSE:2,BUN:2,CREATININE:2,CALCIUM:2 in the last 72 hours  Studies/Results: No results found.  Medications:  I have reviewed the patient's current medications. Scheduled:   . ALPRAZolam  1 mg Oral QID  . Fluticasone-Salmeterol  1 puff Inhalation BID  . gabapentin  600-1,200 mg Oral QID  . insulin aspart  0-15 Units Subcutaneous TID WC  . metFORMIN  1,000 mg Oral BID WC  . nystatin-triamcinolone  1 application Topical QID    . oxyCODONE  20 mg Oral Q12H  . pantoprazole  40 mg Oral Daily  . polyethylene glycol  17 g Oral Daily  . potassium chloride  5-10 mEq Oral BID  . QUEtiapine Fumarate  150 mg Oral QHS  . QUEtiapine  25 mg Oral QHS  . tiotropium  18 mcg Inhalation Daily  . topiramate  200 mg Oral QHS  . torsemide  10-20 mg Oral BID  . trazodone  300 mg Oral QHS  . venlafaxine  300 mg Oral Daily   HYQ:[id number], HYDROmorphone, metoCLOPramide (REGLAN) injection, metoCLOPramide, ondansetron (ZOFRAN) IV, ondansetron  Assessment/Plan [name] with a history of anxiety/bipolar affect/chronic pain, COPD, HTN, DM and past history of PE who underwent elective right foot surgery on 11/29  1. Achilles tendon contracture s/p percutaneous lengthening of Achilles tendon, right tibialis posterior, and flexor digitorum longus  -Per surgery.  2. Psych: anxiety, bipolar disorder, chronic pain  - reschedule Xanax to 6:00 AM q day  -Continue home Xanax, Neurontin, Seroquel, topiramate, Effexor, trazodone  -Pain medication per surgery (patient is on Lorcet and oxycodone at home).  3.COPD  - Stable. No SOB  -Continue home Advair, Spiriva, 2L O2  4. Candidal Rash  - use home medication of nystatin-triamcinolone qid  5 GERD, constipation  -Protonix 40 qd  -Miralax 17g qd  6 HTN  -Stable, continue to monitor  -On torsemide at home. Continue with home potassium supplements.  7 DM  -Continue SSI  8.Social/Dispo  -Patient states this morning that she does not wish to go back to her daughter's house  where she is not welcome. She is unsure where she would like to go. CSW is involved in her case.  -We agree with placement at SNF, if patient would like to go  -We will follow up as an outpatient at our office.  Code FULL    LOS: 4 days   Mat Carne 11/22/2011, 8:24 AM
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
## 2011-11-22 NOTE — Progress Notes (Signed)
Physical Therapy Treatment Patient Details Name: GRICEL COPEN MRN: [id number] DOB: 08-12-1941 Today's Date: 11/22/2011  PT Assessment/Plan  PT - Assessment/Plan Comments on Treatment Session: Pt ambulated for the first time in over a year this session with min guard assist only. Pt with increased motivation to improve mobility. Plan to d/c to SNF this afternoon PT Plan: Discharge plan remains appropriate PT Frequency: Min 4X/week Follow Up Recommendations: Skilled nursing facility Equipment Recommended: Defer to next venue PT Goals  Acute Rehab PT Goals PT Goal Formulation: With patient PT Transfer Goal: Bed to Chair/Chair to Bed - Progress: Progressing toward goal PT Goal: Ambulate - Progress: Progressing toward goal  PT Treatment Precautions/Restrictions  Precautions Precautions: Fall Precaution Comments: pt uses oxygen at home  Restrictions Weight Bearing Restrictions: Yes RLE Weight Bearing: Weight bearing as tolerated Mobility (including Balance) Bed Mobility Bed Mobility: No Transfers Transfers: Yes Sit to Stand: 4: Min assist;3: Mod assist;From chair/3-in-1 (Mod assist for first stand, min for all others) Sit to Stand Details (indicate cue type and reason): VC for hand placement for safety to RW Stand to Sit: 4: Min assist;With upper extremity assist;To chair/3-in-1 Stand to Sit Details: VC for hand placement. Pt with uncontrolled descent into chair secondary to fatigue Ambulation/Gait Ambulation/Gait: Yes Ambulation/Gait Assistance: 4: Min assist (Minguard assist) Ambulation/Gait Assistance Details (indicate cue type and reason): VC for even step length and safety with RW distance. Pt takes large steps with flexed posture. Pt fatigued following ambulation. 2 session of 15 ft. Ambulation Distance (Feet): 15 Feet Assistive device: Rolling walker Gait Pattern: Step-to pattern;Trunk flexed Gait velocity: Normal gait speed Stairs: No    Exercise    End of  Session PT - End of Session Equipment Utilized During Treatment: Gait belt;Other (comment) Activity Tolerance: Patient limited by fatigue Patient left: in chair;with call bell in reach Nurse Communication: Mobility status for transfers;Mobility status for ambulation General Behavior During Session: [facility] for tasks performed Cognition: [facility] for tasks performed  Milana Kidney 11/22/2011, 1:45 PM  11/22/2011 Milana Kidney DPT PAGER: [phone] OFFICE: [phone]

## 2011-11-22 NOTE — Progress Notes (Signed)
Family Medicine Teaching Service Attending Note  I interviewed and examined patient  Recinos and reviewed their tests and x-rays.  I discussed with Dr.  Clinton Sawyer and reviewed their note for today.  I agree with their assessment and plan.     Her cough is much improved this afternoon  Her intertrigo should respond to antifungals  Her decubiti above her cleft is clean without signs of infection - continue barrier cream.  Should improve as she is more mobile  Anxiety - scheduled to go to SNF.  She will need to have Rx for this as she usually receives them from her psychiatrist but will not be able to get from him for several days.  She will have severe withdrawal if she goes without  Dyon Rotert L

## 2011-11-22 NOTE — Progress Notes (Signed)
Occupational Therapy Treatment Patient Details Name: [name] MRN: [id number] DOB: 26-Oct-1941 Today's Date: 11/22/2011  OT Assessment/Plan OT Assessment/Plan OT Plan: Discharge plan remains appropriate OT Frequency: Min 2X/week Follow Up Recommendations: Skilled nursing facility Equipment Recommended: Defer to next venue OT Goals Acute Rehab OT Goals Time For Goal Achievement: 2 weeks ADL Goals Pt Will Transfer to Toilet: Other (comment) (progressing towards goals) ADL Goal: Toilet Transfer - Progress: Other (comment) (progressing towards goals) Pt Will Perform Toileting - Clothing Manipulation: Other (comment) (progressing towards goals) ADL Goal: Toileting - Clothing Manipulation - Progress: Other (comment) (progressing towards goals) Pt Will Perform Toileting - Hygiene: Other (comment) ADL Goal: Toileting - Hygiene - Progress: Other (comment) (progressing towards goals)  OT Treatment Precautions/Restrictions  Precautions Precautions: Fall Restrictions Weight Bearing Restrictions: Yes RLE Weight Bearing: Weight bearing as tolerated   ADL ADL Toilet Transfer: Performed Toilet Transfer Method: Proofreader: Raised toilet seat with arms (or 3-in-1 over toilet) Toileting - Clothing Manipulation: Performed;Minimal assistance Toileting - Clothing Manipulation Details (indicate cue type and reason): cues to reach for gown and pull up and pull undergarments down, pt. nervous but was able to  Where Assessed - Toileting Clothing Manipulation: Sit to stand from 3-in-1 or toilet Toileting - Hygiene: Performed;Minimal assistance Toileting - Hygiene Details (indicate cue type and reason): pt. stood for toilet hygiene and was alble to lean forward to clean front peri-areas Where Assessed - Toileting Hygiene: Standing Equipment Used: Rolling walker ADL Comments: pt. amb. fast and required cues to slow down for safety and endurance, noted pt. became SOB and  un-safe when fatigue set in at end of tx. session Mobility  Bed Mobility Bed Mobility: No Transfers Transfers: Yes Sit to Stand: 4: Min assist;3: Mod assist;Other (comment) (mod a initially, then min a rest of tx.) Sit to Stand Details (indicate cue type and reason): inst. cues for hand placement for safety with rw Stand to Sit: 4: Min assist;With upper extremity assist;To chair/3-in-1 Stand to Sit Details: inst. cues for hand placement, uncontrolled descent to chair with fatigue Exercises    End of Session OT - End of Session Equipment Utilized During Treatment: Gait belt Activity Tolerance: Patient tolerated treatment well Patient left: in chair;with call bell in reach;with family/visitor present General Behavior During Session: [facility] for tasks performed Cognition: [facility] for tasks performed  Robet Leu COTA/L 11/22/2011, 2:35 PM

## 2011-11-22 NOTE — Discharge Summary (Signed)
Physician Discharge Summary  Patient ID: [name] MRN: [id number] DOB/AGE: 1941-11-25 70 y.o.  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  She tolerated the procedure well without evident complications.  She had PT and OT consults stating that she would benefit from continued PT / OT in the SNF setting. Otherwise her hospital stay was unremarkable.  Consults: PT, OT, FPTS  Significant Diagnostic Studies: none  Treatments: therapies: PT  Discharge Exam: Blood pressure 120/82, pulse 143, temperature 98.7 F (37.1 C), temperature source Oral, resp. rate 24, SpO2 91.00%. wounds are dressed and dry with cam boot in place.  Disposition: SNF  Discharge Orders    Future Orders Please Complete By Expires   Diet - low sodium heart healthy      Call MD / Call 911      Comments:   If you experience chest pain or shortness of breath, CALL 911 and be transported to the hospital emergency room.  If you develope a fever above 101 F, pus (white drainage) or increased drainage or redness at the wound, or calf pain, call your surgeon's office.   Constipation Prevention      Comments:   Drink plenty of fluids.  Prune juice may be helpful.  You may use a stool softener, such as Colace (over the counter) 100 mg twice a day.  Use MiraLax (over the counter) for constipation as needed.   Increase activity slowly as tolerated      Weight Bearing as taught in Physical Therapy      Comments:   Use a walker or crutches as instructed.     Current Discharge Medication List    START taking these medications     Details  HYDROcodone-acetaminophen (NORCO) 7.5-325 MG per tablet Take 1-2 tablets by mouth every 6 (six) hours as needed for pain. Qty: 50 tablet, Refills: 0      CONTINUE these medications which have NOT CHANGED   Details  ALPRAZolam (XANAX) 1 MG tablet Take 1 tablet (1 mg total) by mouth 4 (four) times daily. Fill 7/13 Qty: 124 tablet, Refills: 0    esomeprazole (NEXIUM) 40 MG capsule Take 1 capsule (40 mg total) by mouth daily. Qty: 30 capsule, Refills: 11    Fluticasone-Salmeterol (ADVAIR DISKUS) 500-50 MCG/DOSE AEPB Inhale 1 puff into the lungs 2 (two) times daily. Qty: 60 each, Refills: 11    gabapentin (NEURONTIN) 600 MG tablet Take 600-1,200 mg by mouth 4 (four) times daily. Take 1 tablet three times a day and 2 tablets at bedtime     hydrocodone-acetaminophen (LORCET PLUS) 7.5-650 MG per tablet Take 1 tablet by mouth 2 (two) times daily as needed. For pain     metFORMIN (GLUCOPHAGE) 1000 MG tablet Take 1 tablet (1,000 mg total) by mouth 2 (two) times daily with a meal. Qty: 64 tablet, Refills: 11   Associated Diagnoses: Type II or unspecified type diabetes mellitus without mention of complication, not stated as uncontrolled    nystatin-triamcinolone (MYCOLOG II) cream Apply 1 application topically 4 (four) times daily.      oxyCODONE (OXYCONTIN) 20 MG 12 hr tablet Take 1 tablet (20 mg total) by mouth  every 12 (twelve) hours. Fill 30 days from date on RX Qty: 60 tablet, Refills: 0    polyethylene glycol (MIRALAX / GLYCOLAX) packet Take 17 g by mouth daily.      potassium chloride (K-DUR,KLOR-CON) 10 MEQ tablet Take 5-10 mEq by mouth 2 (two) times daily. Takes in the am and 5 meq at bedtime    QUEtiapine (SEROQUEL) 25 MG tablet Take 25 mg by mouth at bedtime.      QUEtiapine Fumarate (SEROQUEL XR) 150 MG 24 hr tablet Take 150 mg by mouth at bedtime.      tiotropium (SPIRIVA) 18 MCG inhalation capsule Place 1 capsule (18 mcg total) into inhaler and inhale  daily. Qty: 30 capsule, Refills: 11    topiramate (TOPAMAX) 200 MG tablet Take 200 mg by mouth at bedtime.      torsemide (DEMADEX) 20 MG tablet Take 10-20 mg by mouth 2 (two) times daily. Take 20 mg (1 tablet)  in the am and 10 mg (half tablet)  in the pm    trazodone (DESYREL) 300 MG tablet Take 300 mg by mouth at bedtime.      venlafaxine (EFFEXOR-XR) 150 MG 24 hr capsule Take 300 mg by mouth daily.     Blood Glucose Monitoring Suppl (PRODIGY BLOOD GLUCOSE MONITOR) DEVI Dispense #1 for glucose monitoring. DM, Type II.  ICD 250.00     Elastic Bandages & Supports (V-4 HIGH COMPRESSION HOSE) MISC 1 Units by Does not apply route daily. Qty: 2 each, Refills: 1   Associated Diagnoses: Leg swelling    ENSURE (ENSURE) Take 1 Can by mouth 3 (three) times daily with meals.      glucose blood (PRODIGY TEST) test strip Check CBG two times a day.  Disp QS.  DM, Type II, ICD 250.00.     PRODIGY LANCETS 28G MISC Check CBG two times a day.  Disp QS.  DM, Type II, ICD 250.00.        Ms. Lueras will need PT / OT focusing on gait training and active / passive ROM of the right ankle.  She can have the cam boot off for PT and showers only.  She can have her dressing changed daily and as needed with dry gause and ace wrap.  She should follow up with me in clinic in 2-3 weeks.  Call [phone] for an appointment.  SignedToni Arthurs 11/22/2011, 7:39 AM

## 2011-12-08 ENCOUNTER — Telehealth: Payer: Self-pay | Admitting: Family Medicine

## 2011-12-08 ENCOUNTER — Ambulatory Visit (INDEPENDENT_AMBULATORY_CARE_PROVIDER_SITE_OTHER): Payer: Medicare Other | Admitting: Family Medicine

## 2011-12-08 VITALS — BP 139/84 | HR 121 | Temp 98.3°F

## 2011-12-08 DIAGNOSIS — L089 Local infection of the skin and subcutaneous tissue, unspecified: Secondary | ICD-10-CM

## 2011-12-08 DIAGNOSIS — L0291 Cutaneous abscess, unspecified: Secondary | ICD-10-CM

## 2011-12-08 MED ORDER — DOXYCYCLINE HYCLATE 100 MG PO TABS: 100.0000 mg | ORAL_TABLET | Freq: Two times a day (BID) | ORAL | Status: DC

## 2011-12-08 NOTE — Progress Notes (Signed)
  Subjective:    Patient ID: [name], female    DOB: Dec 16, 1941, 70 y.o.   MRN: [id number]  HPI Pt is here for acute work-in visit on skin boils. Pt reports multiple small skin boils on belly x 1 week. Pt states that she has a history of these in the past. Pt was seen for this recently and was placed on topical clorhexidine as there was one small localized lesion.  Pt states that this was minimally effective. Pt denies any systemic sxs including fever, nausea, or vomiting. Bumps have been somewhat itchy. Pt states that she was on a cream for this and it was very helpful in itching in the past. Pt is unaware of name of cream.    Review of Systems See HPI, otherwise ROS negative.     Objective:   Physical Exam Gen: in wheelchair, labile affect SKIN: 3-4  0.5-1 cm erythematous lesions with central area of pustular formation along diaper/belt line. Mildly crusted over. No active drainage from any sites.  PULM: Good overall air movement, faint end expiratory wheezes .     Assessment & Plan:

## 2011-12-08 NOTE — Patient Instructions (Signed)

## 2011-12-08 NOTE — Telephone Encounter (Signed)
pts daughter is asking to speak with RN before pts appt today, says pts aide is not coming with her since she is sick, there is a CNA coming but knows nothing about whats going on with pt, she is very sick and will not be able to communicate well with MD. Pts daughter would like to give some details to RN before pt gets here.

## 2011-12-08 NOTE — Assessment & Plan Note (Signed)
Small pustules that have progressed since last clinical visit. No signs of systemic infection. Will treat with oral doxy. Discussed infectious red flags with caregivers. Will follow prn.

## 2011-12-09 ENCOUNTER — Telehealth: Payer: Self-pay | Admitting: Family Medicine

## 2011-12-09 ENCOUNTER — Other Ambulatory Visit: Payer: Self-pay | Admitting: Family Medicine

## 2011-12-09 DIAGNOSIS — L0291 Cutaneous abscess, unspecified: Secondary | ICD-10-CM

## 2011-12-09 MED ORDER — DOXYCYCLINE HYCLATE 100 MG PO TABS: 100.0000 mg | ORAL_TABLET | Freq: Two times a day (BID) | ORAL | Status: AC

## 2011-12-09 NOTE — Telephone Encounter (Signed)
[name],  Nurse from nursing facility calls stating they need an order for there records for the antibiotic that MD prescribed and reason why it was prescribed. .  RX for doxycycline was sent electronically  by Dr. Alvester Morin and I called pharmacy and they have the Rx . Will be available tomorrow.   Consulted with Dr. Laural Benes  Preceptor and she printed off RX and signed and it was faxed to [name] at Nursing facility  along with office note stating why she is  being prescribed this medication.

## 2011-12-09 NOTE — Telephone Encounter (Signed)
Ms. Pickart daughter called to inquire about the Doxycycline rx that was sent to the pharmacy.  Wanted to know if it was sent electronically or given to caregiver that was with Ms. Colford at her appt with you.  Please call the facility she is currently staying for rehab and ask for the nurse that takes care of her.  Ms Andersson's room # is 52.  Their ph# is [phone]

## 2011-12-23 ENCOUNTER — Ambulatory Visit: Payer: Medicare Other | Admitting: Family Medicine

## 2011-12-27 ENCOUNTER — Ambulatory Visit (INDEPENDENT_AMBULATORY_CARE_PROVIDER_SITE_OTHER): Payer: Medicare Other | Admitting: Family Medicine

## 2011-12-27 VITALS — BP 143/91 | HR 122

## 2011-12-27 DIAGNOSIS — J449 Chronic obstructive pulmonary disease, unspecified: Secondary | ICD-10-CM

## 2011-12-27 DIAGNOSIS — M7989 Other specified soft tissue disorders: Secondary | ICD-10-CM

## 2011-12-27 DIAGNOSIS — E119 Type 2 diabetes mellitus without complications: Secondary | ICD-10-CM

## 2011-12-27 DIAGNOSIS — J4489 Other specified chronic obstructive pulmonary disease: Secondary | ICD-10-CM

## 2011-12-27 DIAGNOSIS — R000 Tachycardia, unspecified: Secondary | ICD-10-CM

## 2011-12-27 DIAGNOSIS — Z993 Dependence on wheelchair: Secondary | ICD-10-CM

## 2011-12-27 DIAGNOSIS — L089 Local infection of the skin and subcutaneous tissue, unspecified: Secondary | ICD-10-CM

## 2011-12-27 MED ORDER — METOPROLOL TARTRATE 25 MG PO TABS: 12.5000 mg | ORAL_TABLET | Freq: Two times a day (BID) | ORAL | Status: DC

## 2011-12-27 NOTE — Assessment & Plan Note (Signed)
Improved with elevation of legs and compression hose. Rewrote prescription for compression hose since one had not lost.

## 2011-12-27 NOTE — Patient Instructions (Signed)
Start 12.5 of metoprolol today. You'll take it twice a day. If your heart rate drops below 60, please stop the medication. Give me a call if that happens.

## 2011-12-27 NOTE — Assessment & Plan Note (Signed)
These are improved. I do not think that these the doxycycline today.

## 2011-12-27 NOTE — Assessment & Plan Note (Signed)
Currently relies on a wheelchair for mobility. Able to assist somewhat with transfers. Working on walking with physical therapy at the nursing home.

## 2011-12-27 NOTE — Assessment & Plan Note (Signed)
Likely due to deconditioning, having to move about in the wheelchair. She has had this tachycardia over several years. No concern for PE today. We'll try a very low dose of metoprolol and see if this helps her. I am attempting to lower blood pressure to prevent any concern at physical therapy that would prevent her from getting physical therapy on her foot. Gave a lower limit on heart rate to stop medication.

## 2011-12-27 NOTE — Progress Notes (Signed)
  Subjective:    Patient ID: [name], female    DOB: 03-Mar-1941, 71 y.o.   MRN: [id number]  HPI  Patient here for followup of her foot surgery. She is currently at [facility] with her rehabilitation. She is doing well in rehabilitation and wants to continue. Diabetes-she has had her Seroquel dose increased. Her doctor at the nursing home is following her diabetes. She thinks he is recently done and A1c. COPD-on 3 L oxygen this morning. She thinks that she missed one of her inhaler treatments. She denies any fever, cough, wheezing. Skin tear-patient had a skin tear before hospitalization. He is being treated in the nursing home with Tegaderm. It is healing well.  Review of Systems Denies CP, SOB, HA, N/V/D, fever     Objective:   Physical Exam  Vital signs reviewed General appearance - alert, well appearing, and in no distress and oriented to person, place, and time Heart - tachycardic, regular rhythm, normal S1, S2, no murmurs, rubs, clicks or gallops Chest - clear to auscultation, no wheezes, rales or rhonchi, symmetric air entry, no tachypnea, retractions or cyanosis Extremities-1+ pitting edema to knees. Walking cast on right leg, compression hose on left leg. Skin-exam and skin tear through Tegaderm. This is on the scan in her sacral area. This is covered and clean. There is no redness or drainage. I did not remove the bandage today.       Assessment & Plan:

## 2011-12-27 NOTE — Assessment & Plan Note (Signed)
Currently being followed by the nursing home. We'll check an A1c at next visit.

## 2011-12-27 NOTE — Assessment & Plan Note (Signed)
No wheezes rhonchi or cough today. On a slightly elevated amount of oxygen at 3 L. Will monitor.

## 2012-01-20 ENCOUNTER — Encounter (HOSPITAL_COMMUNITY): Payer: Self-pay | Admitting: Emergency Medicine

## 2012-01-20 ENCOUNTER — Emergency Department (HOSPITAL_COMMUNITY): Payer: Medicare Other

## 2012-01-20 ENCOUNTER — Inpatient Hospital Stay (HOSPITAL_COMMUNITY)
Admission: EM | Admit: 2012-01-20 | Discharge: 2012-01-31 | DRG: 292 | Disposition: A | Discharge status: Skilled Nursing Facility | Payer: Medicare Other | Attending: Family Medicine | Admitting: Family Medicine

## 2012-01-20 ENCOUNTER — Other Ambulatory Visit: Payer: Self-pay

## 2012-01-20 DIAGNOSIS — G8929 Other chronic pain: Secondary | ICD-10-CM | POA: Diagnosis present

## 2012-01-20 DIAGNOSIS — M899 Disorder of bone, unspecified: Secondary | ICD-10-CM | POA: Diagnosis present

## 2012-01-20 DIAGNOSIS — F1320 Sedative, hypnotic or anxiolytic dependence, uncomplicated: Secondary | ICD-10-CM | POA: Diagnosis present

## 2012-01-20 DIAGNOSIS — F319 Bipolar disorder, unspecified: Secondary | ICD-10-CM | POA: Diagnosis present

## 2012-01-20 DIAGNOSIS — M79609 Pain in unspecified limb: Secondary | ICD-10-CM

## 2012-01-20 DIAGNOSIS — E1142 Type 2 diabetes mellitus with diabetic polyneuropathy: Secondary | ICD-10-CM | POA: Diagnosis present

## 2012-01-20 DIAGNOSIS — M949 Disorder of cartilage, unspecified: Secondary | ICD-10-CM | POA: Diagnosis present

## 2012-01-20 DIAGNOSIS — W1809XA Striking against other object with subsequent fall, initial encounter: Secondary | ICD-10-CM | POA: Diagnosis present

## 2012-01-20 DIAGNOSIS — E1149 Type 2 diabetes mellitus with other diabetic neurological complication: Secondary | ICD-10-CM | POA: Diagnosis present

## 2012-01-20 DIAGNOSIS — E876 Hypokalemia: Secondary | ICD-10-CM | POA: Diagnosis not present

## 2012-01-20 DIAGNOSIS — G4700 Insomnia, unspecified: Secondary | ICD-10-CM | POA: Diagnosis present

## 2012-01-20 DIAGNOSIS — Z87891 Personal history of nicotine dependence: Secondary | ICD-10-CM

## 2012-01-20 DIAGNOSIS — S61409A Unspecified open wound of unspecified hand, initial encounter: Secondary | ICD-10-CM | POA: Diagnosis present

## 2012-01-20 DIAGNOSIS — I5032 Chronic diastolic (congestive) heart failure: Principal | ICD-10-CM | POA: Diagnosis present

## 2012-01-20 DIAGNOSIS — Z993 Dependence on wheelchair: Secondary | ICD-10-CM

## 2012-01-20 DIAGNOSIS — E669 Obesity, unspecified: Secondary | ICD-10-CM | POA: Diagnosis present

## 2012-01-20 DIAGNOSIS — F1120 Opioid dependence, uncomplicated: Secondary | ICD-10-CM | POA: Diagnosis present

## 2012-01-20 DIAGNOSIS — J449 Chronic obstructive pulmonary disease, unspecified: Secondary | ICD-10-CM | POA: Diagnosis present

## 2012-01-20 DIAGNOSIS — R197 Diarrhea, unspecified: Secondary | ICD-10-CM | POA: Diagnosis not present

## 2012-01-20 DIAGNOSIS — M7989 Other specified soft tissue disorders: Secondary | ICD-10-CM

## 2012-01-20 DIAGNOSIS — I10 Essential (primary) hypertension: Secondary | ICD-10-CM | POA: Insufficient documentation

## 2012-01-20 DIAGNOSIS — E785 Hyperlipidemia, unspecified: Secondary | ICD-10-CM | POA: Diagnosis present

## 2012-01-20 DIAGNOSIS — E119 Type 2 diabetes mellitus without complications: Secondary | ICD-10-CM

## 2012-01-20 DIAGNOSIS — J4489 Other specified chronic obstructive pulmonary disease: Secondary | ICD-10-CM | POA: Diagnosis present

## 2012-01-20 DIAGNOSIS — Z86711 Personal history of pulmonary embolism: Secondary | ICD-10-CM

## 2012-01-20 DIAGNOSIS — K219 Gastro-esophageal reflux disease without esophagitis: Secondary | ICD-10-CM | POA: Diagnosis present

## 2012-01-20 DIAGNOSIS — R000 Tachycardia, unspecified: Secondary | ICD-10-CM | POA: Diagnosis present

## 2012-01-20 DIAGNOSIS — Z79899 Other long term (current) drug therapy: Secondary | ICD-10-CM

## 2012-01-20 DIAGNOSIS — Y9210 Unspecified residential institution as the place of occurrence of the external cause: Secondary | ICD-10-CM | POA: Diagnosis present

## 2012-01-20 DIAGNOSIS — F411 Generalized anxiety disorder: Secondary | ICD-10-CM | POA: Diagnosis present

## 2012-01-20 DIAGNOSIS — Z8542 Personal history of malignant neoplasm of other parts of uterus: Secondary | ICD-10-CM

## 2012-01-20 DIAGNOSIS — M1990 Unspecified osteoarthritis, unspecified site: Secondary | ICD-10-CM | POA: Diagnosis present

## 2012-01-20 DIAGNOSIS — F1994 Other psychoactive substance use, unspecified with psychoactive substance-induced mood disorder: Secondary | ICD-10-CM | POA: Diagnosis present

## 2012-01-20 DIAGNOSIS — D649 Anemia, unspecified: Secondary | ICD-10-CM | POA: Diagnosis present

## 2012-01-20 DIAGNOSIS — I279 Pulmonary heart disease, unspecified: Secondary | ICD-10-CM | POA: Diagnosis present

## 2012-01-20 DIAGNOSIS — E871 Hypo-osmolality and hyponatremia: Secondary | ICD-10-CM | POA: Diagnosis not present

## 2012-01-20 DIAGNOSIS — IMO0002 Reserved for concepts with insufficient information to code with codable children: Secondary | ICD-10-CM | POA: Diagnosis present

## 2012-01-20 DIAGNOSIS — I509 Heart failure, unspecified: Secondary | ICD-10-CM | POA: Diagnosis present

## 2012-01-20 DIAGNOSIS — F418 Other specified anxiety disorders: Secondary | ICD-10-CM | POA: Diagnosis present

## 2012-01-20 DIAGNOSIS — I872 Venous insufficiency (chronic) (peripheral): Secondary | ICD-10-CM | POA: Diagnosis present

## 2012-01-20 DIAGNOSIS — Z86718 Personal history of other venous thrombosis and embolism: Secondary | ICD-10-CM

## 2012-01-20 LAB — DIFFERENTIAL
Eosinophils Absolute: 0.1 10*3/uL (ref 0.0–0.7)
Lymphocytes Relative: 35 % (ref 12–46)
Lymphs Abs: 2 10*3/uL (ref 0.7–4.0)
Neutro Abs: 3.1 10*3/uL (ref 1.7–7.7)
Neutrophils Relative %: 55 % (ref 43–77)

## 2012-01-20 LAB — URINALYSIS, ROUTINE W REFLEX MICROSCOPIC
Glucose, UA: NEGATIVE mg/dL
Protein, ur: NEGATIVE mg/dL
Urobilinogen, UA: 1 mg/dL (ref 0.0–1.0)

## 2012-01-20 LAB — CBC
MCH: 30.4 pg (ref 26.0–34.0)
MCH: 30.4 pg (ref 26.0–34.0)
MCV: 96.7 fL (ref 78.0–100.0)
Platelets: 188 10*3/uL (ref 150–400)
Platelets: 183 10*3/uL (ref 150–400)
RBC: 3.81 MIL/uL — ABNORMAL LOW (ref 3.87–5.11)
RBC: 3.65 MIL/uL — ABNORMAL LOW (ref 3.87–5.11)
WBC: 5.7 10*3/uL (ref 4.0–10.5)

## 2012-01-20 LAB — BASIC METABOLIC PANEL
GFR calc Af Amer: >90 mL/min (ref >90)
GFR calc non Af Amer: >90 mL/min (ref >90)
Glucose, Bld: 151 mg/dL — ABNORMAL HIGH (ref 70–99)
Potassium: 4.4 mEq/L (ref 3.5–5.1)
Sodium: 135 mEq/L (ref 135–145)

## 2012-01-20 LAB — URINE MICROSCOPIC-ADD ON

## 2012-01-20 LAB — CARDIAC PANEL(CRET KIN+CKTOT+MB+TROPI)
Relative Index: INVALID (ref 0.0–2.5)
Troponin I: <0.3 ng/mL (ref <0.30)

## 2012-01-20 LAB — POCT I-STAT TROPONIN I: Troponin i, poc: 0 ng/mL (ref 0.00–0.08)

## 2012-01-20 MED ORDER — TIOTROPIUM BROMIDE MONOHYDRATE 18 MCG IN CAPS
18.0000 ug | ORAL_CAPSULE | Freq: Every day | RESPIRATORY_TRACT | Status: DC
  Administered 2012-01-22 – 2012-01-31 (×10): 18 ug via RESPIRATORY_TRACT
  Filled 2012-01-20 (×2): qty 5

## 2012-01-20 MED ORDER — VANCOMYCIN HCL IN DEXTROSE 1-5 GM/200ML-% IV SOLN
1000.0000 mg | Freq: Once | INTRAVENOUS | Status: AC
  Administered 2012-01-20: 1000 mg via INTRAVENOUS
  Filled 2012-01-20: qty 200

## 2012-01-20 MED ORDER — METFORMIN HCL 500 MG PO TABS
1000.0000 mg | ORAL_TABLET | Freq: Two times a day (BID) | ORAL | Status: DC
  Administered 2012-01-21 (×2): 1000 mg via ORAL
  Filled 2012-01-20 (×3): qty 2

## 2012-01-20 MED ORDER — ALPRAZOLAM 0.5 MG PO TABS
1.0000 mg | ORAL_TABLET | Freq: Once | ORAL | Status: AC
  Administered 2012-01-20: 1 mg via ORAL

## 2012-01-20 MED ORDER — LORAZEPAM 2 MG/ML IJ SOLN
1.0000 mg | Freq: Once | INTRAMUSCULAR | Status: AC
  Administered 2012-01-20: 1 mg via INTRAVENOUS
  Filled 2012-01-20: qty 1

## 2012-01-20 MED ORDER — POLYETHYLENE GLYCOL 3350 17 G PO PACK
17.0000 g | PACK | Freq: Every day | ORAL | Status: DC
  Administered 2012-01-21 – 2012-01-27 (×5): 17 g via ORAL
  Filled 2012-01-20 (×11): qty 1

## 2012-01-20 MED ORDER — NITROGLYCERIN 2 % TD OINT
1.0000 [in_us] | TOPICAL_OINTMENT | Freq: Once | TRANSDERMAL | Status: DC
  Filled 2012-01-20: qty 30

## 2012-01-20 MED ORDER — QUETIAPINE FUMARATE ER 300 MG PO TB24
300.0000 mg | ORAL_TABLET | Freq: Every day | ORAL | Status: DC
  Administered 2012-01-20 – 2012-01-23 (×4): 300 mg via ORAL
  Filled 2012-01-20 (×5): qty 1

## 2012-01-20 MED ORDER — PANTOPRAZOLE SODIUM 40 MG PO TBEC
40.0000 mg | DELAYED_RELEASE_TABLET | Freq: Every day | ORAL | Status: DC
  Administered 2012-01-21 – 2012-01-31 (×11): 40 mg via ORAL
  Filled 2012-01-20 (×11): qty 1

## 2012-01-20 MED ORDER — OXYCODONE HCL 10 MG PO TB12
20.0000 mg | ORAL_TABLET | Freq: Two times a day (BID) | ORAL | Status: DC
  Administered 2012-01-20 – 2012-01-24 (×8): 20 mg via ORAL
  Filled 2012-01-20 (×2): qty 2
  Filled 2012-01-20 (×2): qty 1
  Filled 2012-01-20 (×5): qty 2

## 2012-01-20 MED ORDER — FUROSEMIDE 10 MG/ML IJ SOLN
40.0000 mg | Freq: Once | INTRAMUSCULAR | Status: AC
  Administered 2012-01-20: 40 mg via INTRAVENOUS
  Filled 2012-01-20: qty 4

## 2012-01-20 MED ORDER — ALPRAZOLAM 0.5 MG PO TABS
1.0000 mg | ORAL_TABLET | Freq: Four times a day (QID) | ORAL | Status: DC
  Administered 2012-01-20 – 2012-01-23 (×13): 1 mg via ORAL
  Filled 2012-01-20 (×14): qty 2

## 2012-01-20 MED ORDER — TORSEMIDE 20 MG PO TABS
80.0000 mg | ORAL_TABLET | Freq: Two times a day (BID) | ORAL | Status: DC
  Filled 2012-01-20 (×2): qty 4

## 2012-01-20 MED ORDER — DEXTROSE 5 % IV SOLN
1.0000 g | Freq: Once | INTRAVENOUS | Status: AC
  Administered 2012-01-20: 1 g via INTRAVENOUS
  Filled 2012-01-20: qty 10

## 2012-01-20 MED ORDER — DIVALPROEX SODIUM ER 500 MG PO TB24
750.0000 mg | ORAL_TABLET | Freq: Two times a day (BID) | ORAL | Status: DC
  Administered 2012-01-20 – 2012-01-22 (×5): 750 mg via ORAL
  Administered 2012-01-23: 500 mg via ORAL
  Administered 2012-01-23: 750 mg via ORAL
  Administered 2012-01-23: 250 mg via ORAL
  Administered 2012-01-24 – 2012-01-31 (×15): 750 mg via ORAL
  Filled 2012-01-20 (×24): qty 1

## 2012-01-20 MED ORDER — FLUTICASONE-SALMETEROL 500-50 MCG/DOSE IN AEPB
1.0000 | INHALATION_SPRAY | Freq: Two times a day (BID) | RESPIRATORY_TRACT | Status: DC
  Administered 2012-01-20 – 2012-01-31 (×18): 1 via RESPIRATORY_TRACT
  Filled 2012-01-20 (×2): qty 14

## 2012-01-20 MED ORDER — ALPRAZOLAM 0.5 MG PO TABS
ORAL_TABLET | ORAL | Status: AC
  Filled 2012-01-20: qty 2

## 2012-01-20 MED ORDER — ENOXAPARIN SODIUM 40 MG/0.4ML ~~LOC~~ SOLN
40.0000 mg | SUBCUTANEOUS | Status: DC
  Administered 2012-01-20 – 2012-01-30 (×11): 40 mg via SUBCUTANEOUS
  Filled 2012-01-20 (×12): qty 0.4

## 2012-01-20 MED ORDER — POTASSIUM CHLORIDE CRYS ER 20 MEQ PO TBCR
20.0000 meq | EXTENDED_RELEASE_TABLET | Freq: Two times a day (BID) | ORAL | Status: AC
  Administered 2012-01-20 – 2012-01-21 (×3): 20 meq via ORAL
  Filled 2012-01-20 (×4): qty 1

## 2012-01-20 MED ORDER — MUPIROCIN 2 % EX OINT
TOPICAL_OINTMENT | Freq: Two times a day (BID) | CUTANEOUS | Status: DC
  Administered 2012-01-21 – 2012-01-31 (×18): via NASAL
  Filled 2012-01-20: qty 22

## 2012-01-20 MED ORDER — MORPHINE SULFATE 4 MG/ML IJ SOLN
4.0000 mg | Freq: Once | INTRAMUSCULAR | Status: AC
  Administered 2012-01-20: 4 mg via INTRAVENOUS
  Filled 2012-01-20: qty 1

## 2012-01-20 MED ORDER — GABAPENTIN 600 MG PO TABS
600.0000 mg | ORAL_TABLET | Freq: Three times a day (TID) | ORAL | Status: DC
  Administered 2012-01-20 – 2012-01-22 (×5): 600 mg via ORAL
  Filled 2012-01-20 (×7): qty 1

## 2012-01-20 NOTE — Progress Notes (Signed)
Received pt from [facility] ED per ambulance. Setteled in bed and oriented to room and call light. Pt has her cell phone calling relatives to let them know which room she is in.Placed on Telemetry monitor.  Report was called by ED RN prior to pt arrival. Marisa Cyphers RN

## 2012-01-20 NOTE — ED Notes (Signed)
To ED via [facility] from [facility] on [address]. States is short of breath, leg swelling from toes to hips. Pt. Crying, states that she can't breathe,

## 2012-01-20 NOTE — ED Notes (Signed)
QIO:NG29<BM> Expected date:01/20/12<BR> Expected time:11:47 AM<BR> Means of arrival:Ambulance<BR> Comments:<BR> EMS 40 GC, bilateral edema, w psych. eval

## 2012-01-20 NOTE — ED Notes (Signed)
Spoke with daughter Windell Moulding [phone]. States that her mom has had blood clots, legs looked more swollen two days ago. Is at nursing home for rehab. And exercises.

## 2012-01-20 NOTE — Progress Notes (Signed)
VASCULAR LAB PRELIMINARY  PRELIMINARY  PRELIMINARY  PRELIMINARY  Bilateral lower extremity venous duplex  completed.    Preliminary report:  Bilateral:  No obvious evidence of DVT, superficial thrombosis, or Baker's Cyst.  Technically difficult study due to edema.     Terance Hart, RVT 01/20/2012, 1:56 PM

## 2012-01-20 NOTE — ED Provider Notes (Signed)
Medical screening examination/treatment/procedure(s) were conducted as a shared visit with non-physician practitioner(s) and myself.  I personally evaluated the patient during the encounter  Increasing bilateral lower terminates swelling for the past 5 days. Was found have an oxygen requirement  Pitting edema, crackles  Labs, chest x-ray, Doppler, antibiotics, IV Lasix. He'll require admission to the family medicine  Dayton Bailiff, MD 01/20/12 1546

## 2012-01-20 NOTE — H&P (Signed)
Family Medicine Teaching [facility] Admission History and Physical  Patient name: [name] Medical record number: [id number] Date of birth: [birth year] Age: 71 y.o. Gender: female  Primary Care Provider: Ellery Plunk, MD, MD  Chief Complaint: "not being treated well at the nursing home" History obtained: from patient, her daughter and her nurse at [facility] nursing home.  History of Present Illness: [name] is a 71 y.o. year old female  with a past medical history significant for DVT status post [place] filter placement, grade 1 diastolic heart failure, COPD (2-3 L baseline O2 requirement), chronic venous insufficiency, bipolar disorder , anxiety and chronic pain presents as a transfer from [facility] ED where she was brought over from [facility] nursing home earlier today with complaint of bilateral lower extremity swelling and pain. Per her daughter, she has had multiple medication changes in the last two months in an attempt to manage her anxiety, bipolar disorder and chronic pain. She was increased to 750 mg of Depakote twice a day and was started on Abilify 5 mg daily which seemed to cause significant sedation. She was sedated for the past 3 days. This morning she was more alert and began complaining of bilateral lower extremity pain and swelling. The patient has chronic pain but denies chest pain, shortness of breath, cough, dysuria. Per her daughter she does not a lot of fluids.  She is not very mobile as she  mostly ambulates via wheelchair but does do some transferring. Patient's daughter states that her legs are 3x normal size. Patient denies pain in her legs at rest but does admit to pain on palpation.  Of note the patient had a fall while transferring to the commode yesterday during which time she hit the dorsum of her right hand that resulted in a thin walled hematoma that has since ruptured. Patient reports pain on the dorsum of her right hand that is  exacerbated by palpation.   ED course: patient received CTX IV x 1 for ? UTI, POC trop neg, LE venous doppler negative, pro BNP 516.   Patient Active Problem List  Diagnoses  . NEOPLASM UNCERTAIN BEHAVIOR MAJOR SALIV GLANDS  . DIABETES MELLITUS, TYPE II  . HYPERLIPIDEMIA  . OBESITY, NOS  . ANEMIA-NOS  . BIPOLAR DISORDER  . ANXIETY  . INSOMNIA, CHRONIC  . Other chronic pain  . HYPERTENSION, BENIGN SYSTEMIC  . PULMONARY EMBOLISM  . COR PULMONALE  . DEEP VENOUS THROMBOPHLEBITIS, LEG, LEFT  . VENOUS INSUFFICIENCY, CHRONIC  . COPD  . PULMONARY NODULE, SOLITARY  . GERD  . OSTEOPENIA  . SYMPTOM, INCONTINENCE, URINARY NOS  . TOBACCO ABUSE, HX OF  . GREENFIELD FILTER INSERTION, HX OF  . Wheelchair dependence  . Leg swelling  . Chronic right shoulder pain  . Ankle arthritis  . Candida albicans infection  . Thrush  . Skin pustule(s)  . Tachycardia  . Skin tear   Past Medical History: Past Medical History  Diagnosis Date  . Anxiety   . Arthritis   . COPD (chronic obstructive pulmonary disease)   . GERD (gastroesophageal reflux disease)   . Hyperlipidemia   . Osteoporosis   . DVT (deep venous thrombosis)   . PE (pulmonary thromboembolism)   . Hypertension      PT DENIES....ON NO  MEDS  . Diabetes mellitus   . Cancer 1969    cervical  . Pneumonia     h/o  . Bronchitis     h/o  . Blood transfusion   .  Anemia   . Bipolar affect, depressed     Past Surgical History: Past Surgical History  Procedure Date  . Vena cava filter placement   . 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 Eye surgery     CATARACTS  . Achilles tendon lengthening 11/18/11    and repair w/posterior tibial tendon lengthening; right  foot  . Cholecystectomy   . Abdominal hysterectomy 1969    "womb taken out for cervical cancer"  . Cataract extraction, bilateral ~ 2008  . Tonsillectomy     "as a child"  . Achilles tendon surgery 11/18/2011    Procedure: ACHILLES TENDON REPAIR;  Surgeon:  Toni Arthurs, MD;  Location: [facility] OR;  Service: Orthopedics;  Laterality: Right;  Right Posterior Tibial Tendon Lenghtening and Tendon Achilles Lenghtening     Social History: History   Social History  . Marital Status: Divorced    Spouse Name: N/A    Number of Children: N/A  . Years of Education: N/A   Social History Main Topics  . Smoking status: Former Smoker -- 1.0 packs/day    Types: Cigarettes    Quit date: 11/14/1986  . Smokeless tobacco: Never Used  . Alcohol Use: No  . Drug Use: No  . Sexually Active: No   Other Topics Concern  . None   Social History Narrative  . None    Family History: History reviewed. No pertinent family history.  Allergies: Allergies  Allergen Reactions  . Sulfamethoxazole W/Trimethoprim Hives  . Zyprexa     Hallucinations and disorientation   Prescriptions prior to admission  Medication Sig Dispense Refill  . ALPRAZolam (XANAX) 1 MG tablet Take 1 tablet (1 mg total) by mouth 4 (four) times daily. Fill 7/13  124 tablet  0  . ARIPiprazole (ABILIFY) 5 MG tablet Take 5 mg by mouth at bedtime.      . divalproex (DEPAKOTE ER) 250 MG 24 hr tablet Take 750 mg by mouth 2 (two) times daily.       Marland Kitchen ENSURE (ENSURE) Take 1 Can by mouth 3 (three) times daily with meals.        . Fluticasone-Salmeterol (ADVAIR DISKUS) 500-50 MCG/DOSE AEPB Inhale 1 puff into the lungs 2 (two) times daily.  60 each  11  . gabapentin (NEURONTIN) 600 MG tablet Take 600 mg by mouth 3 (three) times daily.       . Melatonin 3 MG CAPS Take 6 mg by mouth at bedtime.      . metFORMIN (GLUCOPHAGE) 1000 MG tablet Take 1 tablet (1,000 mg total) by mouth 2 (two) times daily with a meal.  64 tablet  11  . metoprolol tartrate (LOPRESSOR) 25 MG tablet Take 0.5 tablets (12.5 mg total) by mouth 2 (two) times daily.  30 tablet  11  . nystatin-triamcinolone (MYCOLOG II) cream Apply 1 application topically 4 (four) times daily.        Marland Kitchen omeprazole (PRILOSEC) 20 MG capsule Take 20 mg by  mouth daily.      Marland Kitchen oxycodone (OXY-IR) 5 MG capsule Take 5 mg by mouth every 4 (four) hours as needed.      Marland Kitchen oxyCODONE (OXYCONTIN) 20 MG 12 hr tablet Take 1 tablet (20 mg total) by mouth every 12 (twelve) hours. Fill 30 days from date on RX  60 tablet  0  . polyethylene glycol (MIRALAX / GLYCOLAX) packet Take 17 g by mouth daily.        . potassium  chloride (K-DUR,KLOR-CON) 10 MEQ tablet Take 5-10 mEq by mouth 2 (two) times daily. Takes in the am and 5 meq at bedtime      . promethazine (PHENERGAN) 25 MG tablet Take 25 mg by mouth every 6 (six) hours as needed. nausea      . QUEtiapine (SEROQUEL XR) 300 MG 24 hr tablet Take 300 mg by mouth at bedtime.      Marland Kitchen tiotropium (SPIRIVA) 18 MCG inhalation capsule Place 1 capsule (18 mcg total) into inhaler and inhale daily.  30 capsule  11  . torsemide (DEMADEX) 10 MG tablet Take 20 mg by mouth 2 (two) times daily. Takes 2 tablets every morning for 20mg  dosage and takes 1 tablet at night for 10mg  dosage      . trazodone (DESYREL) 300 MG tablet Take 150 mg by mouth at bedtime.       Marland Kitchen venlafaxine (EFFEXOR-XR) 150 MG 24 hr capsule Take 300 mg by mouth daily.       . vitamin C (ASCORBIC ACID) 500 MG tablet Take 500 mg by mouth 2 (two) times daily.        Current Facility-Administered Medications  Medication Dose Route Frequency Provider Last Rate Last Dose  . ALPRAZolam (XANAX) 0.5 MG tablet           . ALPRAZolam Prudy Feeler) tablet 1 mg  1 mg Oral Once Otilio Miu, PA   1 mg at 01/20/12 1737  . ALPRAZolam Prudy Feeler) tablet 1 mg  1 mg Oral QID Dessa Phi, MD   1 mg at 01/20/12 2108  . cefTRIAXone (ROCEPHIN) 1 g in dextrose 5 % 50 mL IVPB  1 g Intravenous Once Otilio Miu, PA   1 g at 01/20/12 1645  . divalproex (DEPAKOTE ER) 24 hr tablet 750 mg  750 mg Oral BID Dessa Phi, MD   750 mg at 01/20/12 2201  . enoxaparin (LOVENOX) injection 40 mg  40 mg Subcutaneous Q24H Dorothe Elmore, MD   40 mg at 01/20/12 2201  .  Fluticasone-Salmeterol (ADVAIR) 500-50 MCG/DOSE inhaler 1 puff  1 puff Inhalation BID Virjean Boman, MD      . furosemide (LASIX) injection 40 mg  40 mg Intravenous Once [employer], PA   40 mg at 01/20/12 1410  . gabapentin (NEURONTIN) tablet 600 mg  600 mg Oral TID Dessa Phi, MD   600 mg at 01/20/12 2201  . LORazepam (ATIVAN) injection 1 mg  1 mg Intravenous Once Otilio Miu, PA   1 mg at 01/20/12 1632  . metFORMIN (GLUCOPHAGE) tablet 1,000 mg  1,000 mg Oral BID WC Tyeesha Riker, MD      . morphine 4 MG/ML injection 4 mg  4 mg Intravenous Once Otilio Miu, PA   4 mg at 01/20/12 1612  . nitroGLYCERIN (NITROGLYN) 2 % ointment 1 inch  1 inch Topical Once Dayton Bailiff, MD      . oxyCODONE (OXYCONTIN) 12 hr tablet 20 mg  20 mg Oral Q12H Leomia Blake, MD   20 mg at 01/20/12 2108  . pantoprazole (PROTONIX) EC tablet 40 mg  40 mg Oral Q1200 Hazelene Doten, MD      . polyethylene glycol (MIRALAX / GLYCOLAX) packet 17 g  17 g Oral Daily Blessing Zaucha, MD      . potassium chloride SA (K-DUR,KLOR-CON) CR tablet 20 mEq  20 mEq Oral BID Dessa Phi, MD   20 mEq at 01/20/12 2108  . QUEtiapine (SEROQUEL XR) 24 hr  tablet 300 mg  300 mg Oral QHS Amogh Komatsu, MD   300 mg at 01/20/12 2201  . tiotropium (SPIRIVA) inhalation capsule 18 mcg  18 mcg Inhalation Daily Dionel Archey, MD      . torsemide (DEMADEX) tablet 80 mg  80 mg Oral BID Rheagan Nayak, MD      . vancomycin (VANCOCIN) IVPB 1000 mg/200 mL premix  1,000 mg Intravenous Once Dayton Bailiff, MD   1,000 mg at 01/20/12 1357   Review Of Systems: Per HPI with the following additions: rash (abdominal intertriginous candidal rash)  Physical Exam: Pulse: 93-124  Blood Pressure: 129/82 RR: 17   O2: 98%on 2 L Temp: 98.3  General: alert, cooperative, distracted and intermittently distressed requesting not to be left alone, asking for pain medication and sleep medication.  HEENT: extra ocular movement intact,  sclera clear, anicteric and neck supple with midline trachea Heart: S1, S2 normal, no murmur, rub or gallop, regular rate and rhythm Lungs: no wheezes or rales and unlabored breathing, slight dullness to percussion bilateral bases.  Abdomen: abdomen is soft without significant tenderness, masses, organomegaly or guarding Extremities: 2+ LE edema, warm and dry. symmetrically enlarged. No palpable cords. No skin breakdown. 2+ DP pulses.  2+ radial pulses bilaterally.  Skin:ecchymoses on dorsum of R hand with lateral tear in epidermis, small amount of bleeding.  Neurology: grossly normal without focal findings. Patient alert and oriented to person, place and situation.    Pertinent Labs/Imaging/Studies:  proBNP 519.9 (44 on 01/28/11) K 4.4 Cr 0.49 CO2 32 Glucose 151 WBC 5.8 POC trop 0.00 Trop < 0.3 x 1  UA: sp grav 1.016, negative for blood, protein, nitrite. Large LE, TNTC WBC.   LE Doppler: preliminary read negative.  EKG: Sinus tachycardia, rate of 115,  Normal axis, normal QTc (437), no ST elevation or depression.  CXR: There are small bilateral pleural effusions and pulmonary venous congestion. Atelectasis is noted in the left base.   Assessment and Plan: ROSAURA BOLON is a 71 y.o. year old female presenting with bilateral LE edema.  1. LE edema:  multifactorial from chronic venous insufficiency and mild diastolic congestive heart failure. Posterior Dopplers are negative for DVT. Have considered cellulitis of this is less likely given that the findings are bilateral the patient is afebrile and has a normal white count.   Plan:  - Increase torsemide to 80 mg twice a day x3 doses   -Increase potassium chloride to 20 mEq twice a day x3 doses   -Strict ins and outs and daily weights. Maintain Foley catheter to accurately quantify urine output.  -Obtain echo and cycle cardiac enzymes to evaluate LV and RV function and to rule out new onset valvular heart disease and acute coronary  syndrome.  -Keep legs elevated   -Obtain sedimentation rate and repeat white blood cell count in the morning   -Obtain lower extremity arterial duplex to rule out arterial insufficiency prior to applying a compression i.e. Unna boots.   2. Pyuria: Mostly sterile pyuria patient denies dysuria and has no blood protein or nitrates on UA. Will not treat for UTI asthma suspicion is low.  3. Polypharmacy: Patient taking multiple medications for pain anxiety, bipolar depression, insomnia and chronic pain. I suspect that the combination of medications is actually worsening her anxiety and causing some delirium. Plan to continue Xanax, Depakote, gabapentin, Seroquel and OxyContin. Plan to hold prn oxycodone, Abilify, trazodone, Phenergan, Effexor and melatonin.   4. R hand wound: Wound cleaned by nurse  tech. I advised use of Bactroban ointment, twice daily dressing change and ice prn. Wound care consult ordered.   5. FEN/GI: carb modified diet. Daily CBGs.   6. Prophylaxis:  Lovenox 40 mg [place] q daily.   7. Disposition: to SNF pending work-up. SW consult needed has I suspect patient and family will decline returing to [facility]

## 2012-01-20 NOTE — ED Provider Notes (Signed)
History     CSN: [id number]  Arrival date & time 01/20/12  1144   First MD Initiated Contact with Patient 01/20/12 1157      Chief Complaint  Patient presents with  . Shortness of Breath  . Anxiety  . Leg Swelling    (Consider location/radiation/quality/duration/timing/severity/associated sxs/prior treatment) HPI History provided by pt and patient's daughter.  Pt comes from nursing home.  She has been complaining of bilateral LE pain for the past 5 days.  Associated w/ edema.  No trauma.  No associated fever.  Pt has h/o DVT as well as PE.  She has not complained of CP/SOB and she denies these symptoms now.  She has a h/o anxiety and was recently started on abilify to supplement her effexor and seroquel.  The combination of these medications was sedating her.  The only time she spoke to her daughter prior to abilify being d/c'd 2 days ago, was to tell her that her legs hurt.  Pt has a very high pain threshold.  Her mentation is gradually improving now.    Past Medical History  Diagnosis Date  . Anxiety   . Arthritis   . COPD (chronic obstructive pulmonary disease)   . GERD (gastroesophageal reflux disease)   . Hyperlipidemia   . Osteoporosis   . DVT (deep venous thrombosis)   . PE (pulmonary thromboembolism)   . Hypertension      PT DENIES....ON NO  MEDS  . Diabetes mellitus   . Cancer 1969    cervical  . Pneumonia     h/o  . Bronchitis     h/o  . Blood transfusion   . Anemia   . Bipolar affect, depressed     Past Surgical History  Procedure Date  . Vena cava filter placement   . 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 Eye surgery     CATARACTS  . Achilles tendon lengthening 11/18/11    and repair w/posterior tibial tendon lengthening; right  foot  . Cholecystectomy   . Abdominal hysterectomy 1969    "womb taken out for cervical cancer"  . Cataract extraction, bilateral ~ 2008  . Tonsillectomy     "as a child"  . Achilles tendon surgery 11/18/2011   Procedure: ACHILLES TENDON REPAIR;  Surgeon: Toni Arthurs, MD;  Location: [facility] OR;  Service: Orthopedics;  Laterality: Right;  Right Posterior Tibial Tendon Lenghtening and Tendon Achilles Lenghtening     No family history on file.  History  Substance Use Topics  . Smoking status: Former Smoker -- 1.0 packs/day    Types: Cigarettes    Quit date: 11/14/1986  . Smokeless tobacco: Never Used  . Alcohol Use: No    OB History    Grav Para Term Preterm Abortions TAB SAB Ect Mult Living                  Review of Systems  All other systems reviewed and are negative.    Allergies  Sulfamethoxazole w/trimethoprim and Zyprexa  Home Medications   Current Outpatient Rx  Name Route Sig Dispense Refill  . ALPRAZOLAM 1 MG PO TABS Oral Take 1 tablet (1 mg total) by mouth 4 (four) times daily. Fill 7/13 124 tablet 0  . ALPRAZOLAM 1 MG PO TABS Oral Take 1 tablet (1 mg total) by mouth 4 (four) times daily. 30 tablet 0  . PRODIGY BLOOD GLUCOSE MONITOR DEVI  Dispense #1 for glucose monitoring. DM, Type II.  ICD 250.00     . V-4 HIGH COMPRESSION HOSE MISC Does not apply 1 Units by Does not apply route daily. 2 each 1  . ENSURE PO LIQD Oral Take 1 Can by mouth 3 (three) times daily with meals.      Marland Kitchen ESOMEPRAZOLE MAGNESIUM 40 MG PO CPDR Oral Take 1 capsule (40 mg total) by mouth daily. 30 capsule 11  . FLUTICASONE-SALMETEROL 500-50 MCG/DOSE IN AEPB Inhalation Inhale 1 puff into the lungs 2 (two) times daily. 60 each 11  . GABAPENTIN 600 MG PO TABS Oral Take 600-1,200 mg by mouth 4 (four) times daily. Take 1 tablet three times a day and 2 tablets at bedtime     . GLUCOSE BLOOD VI STRP  Check CBG two times a day.  Disp QS.  DM, Type II, ICD 250.00.     . HYDROCODONE-ACETAMINOPHEN 7.5-650 MG PO TABS Oral Take 1 tablet by mouth 2 (two) times daily as needed. For pain     . METFORMIN HCL 1000 MG PO TABS Oral Take 1 tablet (1,000 mg total) by mouth 2 (two) times daily with a meal. 64 tablet 11  .  METOPROLOL TARTRATE 25 MG PO TABS Oral Take 0.5 tablets (12.5 mg total) by mouth 2 (two) times daily. 30 tablet 11  . NYSTATIN-TRIAMCINOLONE 100000-0.1 UNIT/GM-% EX CREA Topical Apply 1 application topically 4 (four) times daily.      . OXYCODONE HCL ER 20 MG PO TB12 Oral Take 1 tablet (20 mg total) by mouth every 12 (twelve) hours. Fill 30 days from date on RX 60 tablet 0  . POLYETHYLENE GLYCOL 3350 PO PACK Oral Take 17 g by mouth daily.      Marland Kitchen POTASSIUM CHLORIDE CRYS ER 10 MEQ PO TBCR Oral Take 5-10 mEq by mouth 2 (two) times daily. Takes in the am and 5 meq at bedtime    . PRODIGY LANCETS 28G MISC  Check CBG two times a day.  Disp QS.  DM, Type II, ICD 250.00.     Marland Kitchen QUETIAPINE FUMARATE 25 MG PO TABS Oral Take 25 mg by mouth at bedtime.      Marland Kitchen QUETIAPINE FUMARATE ER 150 MG PO TB24 Oral Take 150 mg by mouth at bedtime.      Marland Kitchen TIOTROPIUM BROMIDE MONOHYDRATE 18 MCG IN CAPS Inhalation Place 1 capsule (18 mcg total) into inhaler and inhale daily. 30 capsule 11  . TOPIRAMATE 200 MG PO TABS Oral Take 200 mg by mouth at bedtime.      . TORSEMIDE 20 MG PO TABS Oral Take 10-20 mg by mouth 2 (two) times daily. Take 20 mg (1 tablet)  in the am and 10 mg (half tablet)  in the pm    . TRAZODONE HCL 300 MG PO TABS Oral Take 300 mg by mouth at bedtime.      . VENLAFAXINE HCL ER 150 MG PO CP24 Oral Take 300 mg by mouth daily.       BP 129/66  Pulse 115  Temp(Src) 98.6 F (37 C) (Oral)  Resp 20  SpO2 93%  Physical Exam  Nursing note and vitals reviewed. Constitutional: She is oriented to person, place, and time. She appears well-developed and well-nourished. No distress.  HENT:  Head: Normocephalic and atraumatic.  Eyes:       Normal appearance  Neck: Normal range of motion.  Cardiovascular: Normal rate and regular rhythm.   Pulmonary/Chest: Effort normal and breath sounds normal.  Abdominal: Soft. Bowel sounds are  normal. She exhibits no distension. There is no tenderness.       obese    Musculoskeletal:       Bilateral, 2+ pitting edema LEs; left worse than right.  Erythema and warm to touch from knees down.  Both ttp.  2+ DP pulses and distal sensation intact.   Neurological: She is alert and oriented to person, place, and time.  Skin: Skin is warm and dry. No rash noted.  Psychiatric: She has a normal mood and affect. Her behavior is normal.    ED Course  Procedures (including critical care time)   Date: 01/20/2012  Rate: 115  Rhythm: sinus tachycardia  QRS Axis: normal  Intervals: normal  ST/T Wave abnormalities: normal  Conduction Disutrbances:none  Narrative Interpretation:  PVC  Old EKG Reviewed: unchanged   Labs Reviewed  CBC - Abnormal; Notable for the following:    RBC 3.81 (*)    Hemoglobin 11.6 (*)    All other components within normal limits  BASIC METABOLIC PANEL - Abnormal; Notable for the following:    Chloride 92 (*)    Glucose, Bld 151 (*)    All other components within normal limits  PRO B NATRIURETIC PEPTIDE - Abnormal; Notable for the following:    Pro B Natriuretic peptide (BNP) 519.9 (*)    All other components within normal limits  URINALYSIS, ROUTINE W REFLEX MICROSCOPIC - Abnormal; Notable for the following:    APPearance CLOUDY (*)    Ketones, ur TRACE (*)    Leukocytes, UA LARGE (*)    All other components within normal limits  URINE MICROSCOPIC-ADD ON - Abnormal; Notable for the following:    Squamous Epithelial / LPF FEW (*)    Bacteria, UA FEW (*)    All other components within normal limits  DIFFERENTIAL  POCT I-STAT TROPONIN I   Dg Chest 2 View  01/20/2012  *RADIOLOGY REPORT*  Clinical Data: SOB.  Leg swelling and anxiety.  CHEST - 2 VIEW  Comparison: 09/29/2011  Findings: Heart size is normal.  There are small bilateral pleural effusions and pulmonary venous congestion.  Atelectasis is noted in the left base.  Old right posterior lateral rib fracture deformity noted.  IMPRESSION:  1.  Suspect mild CHF. 2.  Left base  atelectasis.  Original Report Authenticated By: Rosealee Albee, M.D.     1. Congestive heart failure       MDM  Pt sent from nursing home for bilateral LE pain/edema.  On exam, afebrile, NAD, tachycardic, lungs CTA, bilateral LE pitting edema, erythema, warmth and ttp.  PMH DVT; dopplers neg today.   EKG non-ischemic. CXR shows mild CHF and BNP elevated.  Labs are otherwise unremarkable w/ exception of troponin and U/A which are pending.  Pt has received 40mg  IV lasix as well as vancomycin for possible but less likely cellulitis.  Troponin neg.  U/A pos for infection.  IV rocephin ordered.  VS are currently stable and pt sleeping.  Dr. Brooke Dare has consulted Family Practice for admission.           Otilio Miu, [place] 01/20/12 1531

## 2012-01-21 ENCOUNTER — Other Ambulatory Visit: Payer: Self-pay

## 2012-01-21 ENCOUNTER — Encounter (HOSPITAL_COMMUNITY): Payer: Self-pay | Admitting: General Practice

## 2012-01-21 DIAGNOSIS — R404 Transient alteration of awareness: Secondary | ICD-10-CM

## 2012-01-21 DIAGNOSIS — R609 Edema, unspecified: Secondary | ICD-10-CM

## 2012-01-21 DIAGNOSIS — I509 Heart failure, unspecified: Secondary | ICD-10-CM

## 2012-01-21 DIAGNOSIS — F411 Generalized anxiety disorder: Secondary | ICD-10-CM

## 2012-01-21 DIAGNOSIS — J449 Chronic obstructive pulmonary disease, unspecified: Secondary | ICD-10-CM

## 2012-01-21 LAB — CARDIAC PANEL(CRET KIN+CKTOT+MB+TROPI)
CK, MB: 1.4 ng/mL (ref 0.3–4.0)
Total CK: 50 U/L (ref 7–177)
Troponin I: <0.3 ng/mL (ref <0.30)

## 2012-01-21 LAB — GLUCOSE, CAPILLARY
Glucose-Capillary: 147 mg/dL — ABNORMAL HIGH (ref 70–99)
Glucose-Capillary: 182 mg/dL — ABNORMAL HIGH (ref 70–99)

## 2012-01-21 LAB — BASIC METABOLIC PANEL
CO2: 35 mEq/L — ABNORMAL HIGH (ref 19–32)
Calcium: 8.9 mg/dL (ref 8.4–10.5)
Glucose, Bld: 148 mg/dL — ABNORMAL HIGH (ref 70–99)
Sodium: 134 mEq/L — ABNORMAL LOW (ref 135–145)

## 2012-01-21 LAB — CBC
Hemoglobin: 11.2 g/dL — ABNORMAL LOW (ref 12.0–15.0)
MCH: 29.9 pg (ref 26.0–34.0)
MCV: 97.3 fL (ref 78.0–100.0)
Platelets: 185 10*3/uL (ref 150–400)
RBC: 3.74 MIL/uL — ABNORMAL LOW (ref 3.87–5.11)

## 2012-01-21 LAB — VALPROIC ACID LEVEL: Valproic Acid Lvl: 64.7 ug/mL (ref 50.0–100.0)

## 2012-01-21 MED ORDER — CHLORHEXIDINE GLUCONATE CLOTH 2 % EX PADS
6.0000 | MEDICATED_PAD | Freq: Every day | CUTANEOUS | Status: AC
  Administered 2012-01-21 – 2012-01-25 (×5): 6 via TOPICAL

## 2012-01-21 MED ORDER — INSULIN ASPART 100 UNIT/ML ~~LOC~~ SOLN
0.0000 [IU] | Freq: Every day | SUBCUTANEOUS | Status: DC
  Administered 2012-01-22 – 2012-01-24 (×2): 2 [IU] via SUBCUTANEOUS
  Administered 2012-01-25: 3 [IU] via SUBCUTANEOUS
  Administered 2012-01-26: 2 [IU] via SUBCUTANEOUS
  Filled 2012-01-21: qty 3

## 2012-01-21 MED ORDER — HALOPERIDOL 0.5 MG PO TABS
0.5000 mg | ORAL_TABLET | Freq: Every evening | ORAL | Status: DC | PRN
  Administered 2012-01-22 – 2012-01-24 (×2): 0.5 mg via ORAL
  Filled 2012-01-21 (×2): qty 1

## 2012-01-21 MED ORDER — TORSEMIDE 20 MG PO TABS
80.0000 mg | ORAL_TABLET | Freq: Two times a day (BID) | ORAL | Status: DC
  Administered 2012-01-21 – 2012-01-23 (×5): 80 mg via ORAL
  Filled 2012-01-21 (×7): qty 4

## 2012-01-21 MED ORDER — OXYCODONE HCL 5 MG PO TABS
5.0000 mg | ORAL_TABLET | Freq: Four times a day (QID) | ORAL | Status: DC | PRN
  Administered 2012-01-21 – 2012-01-24 (×6): 5 mg via ORAL
  Filled 2012-01-21 (×7): qty 1

## 2012-01-21 MED ORDER — MUPIROCIN 2 % EX OINT
1.0000 "application " | TOPICAL_OINTMENT | Freq: Two times a day (BID) | CUTANEOUS | Status: AC
  Administered 2012-01-21 – 2012-01-25 (×7): 1 via NASAL
  Filled 2012-01-21 (×2): qty 22

## 2012-01-21 MED ORDER — INSULIN ASPART 100 UNIT/ML ~~LOC~~ SOLN
0.0000 [IU] | Freq: Three times a day (TID) | SUBCUTANEOUS | Status: DC
  Administered 2012-01-22: 3 [IU] via SUBCUTANEOUS
  Administered 2012-01-22: 2 [IU] via SUBCUTANEOUS
  Administered 2012-01-22 – 2012-01-23 (×2): 5 [IU] via SUBCUTANEOUS
  Administered 2012-01-23: 2 [IU] via SUBCUTANEOUS
  Administered 2012-01-23: 8 [IU] via SUBCUTANEOUS
  Administered 2012-01-24: 5 [IU] via SUBCUTANEOUS
  Administered 2012-01-24: 8 [IU] via SUBCUTANEOUS
  Administered 2012-01-24 – 2012-01-25 (×2): 3 [IU] via SUBCUTANEOUS
  Administered 2012-01-25: 8 [IU] via SUBCUTANEOUS
  Administered 2012-01-25 – 2012-01-26 (×2): 5 [IU] via SUBCUTANEOUS
  Administered 2012-01-26: 3 [IU] via SUBCUTANEOUS
  Administered 2012-01-26 – 2012-01-27 (×4): 5 [IU] via SUBCUTANEOUS
  Administered 2012-01-28 – 2012-01-29 (×4): 3 [IU] via SUBCUTANEOUS
  Administered 2012-01-29: 5 [IU] via SUBCUTANEOUS
  Administered 2012-01-29: 3 [IU] via SUBCUTANEOUS
  Administered 2012-01-30: 2 [IU] via SUBCUTANEOUS
  Administered 2012-01-30: 3 [IU] via SUBCUTANEOUS
  Administered 2012-01-30 – 2012-01-31 (×3): 2 [IU] via SUBCUTANEOUS
  Filled 2012-01-21 (×2): qty 3

## 2012-01-21 NOTE — Progress Notes (Signed)
CRITICAL VALUE ALERT  Critical value received: + MRSA PCR Date of notification: 01/21/2012 Time of notification:  0100  Critical value read back: YES  Nurse who received alert:  Jethro Poling MD notified (1st page):  Time of first page:   MD notified (2nd page):  Time of second page:  Responding MD Time MD responded:

## 2012-01-21 NOTE — Progress Notes (Signed)
*  PRELIMINARY RESULTS* Echocardiogram 2D Echocardiogram has been performed.  Glean Salen [facility] 01/21/2012, 11:39 AM

## 2012-01-21 NOTE — Progress Notes (Signed)
Pt's  HR 120-130' last BP 163/103.  Pt with constant yelling & sreaming out for various things.  "Pull me up in the bed, fix my pillows, want to make a phone call."  Dr Konrad Dolores informed.  Also gave him pt's daughter Stanton Kidney = to call for req. Update.  Will cont. To monitor.  Amanda Pea, RN

## 2012-01-21 NOTE — Progress Notes (Signed)
I have seen and examined this patient with Dr Konrad Dolores.  I agree with their findings and plans as documented in their progress note for today.

## 2012-01-21 NOTE — Progress Notes (Addendum)
Pt's CBG 235 at dinner time  & with no insulin coverage.  Dr Sondra Come made aware.  [name] [employer].

## 2012-01-21 NOTE — Progress Notes (Signed)
01/21/12 1430 UR Completed. Tera Mater, RN, BSN

## 2012-01-21 NOTE — Progress Notes (Signed)
Family Medicine Teaching Service [facility] Progress Note  Patient name: [name] Medical record number: [id number] Date of birth: [birth year] Age: 71 y.o. Gender: female    LOS: 1 day   Primary Care Provider: Ellery Plunk, MD, MD  Overnight Events: No acute events overnight. Continues to complain of bilat LE pain that is somewhat worse today. Pt requesting pain Rx as she says it helps her with everything. She says she's a very anxious person. Pt states that she used to be mobile in her wheelchair at the nursing home, but is now unable to get out of bed due to leg pain/stiffness.   Denies: CP, SOB (above baseline), n/v/d, constipation, abdominal pain, syncope, HA   Objective: Vital signs in last 24 hours: Temp:  [97.4 F (36.3 C)-98.6 F (37 C)] 97.4 F (36.3 C) (02/01 0458) Pulse Rate:  [93-124] 100  (02/01 0458) Resp:  [14-28] 19  (02/01 0458) BP: (111-129)/(66-82) 129/67 mmHg (02/01 0458) SpO2:  [93 %-100 %] 95 % (02/01 0458) FiO2 (%):  [28 %] 28 % (01/31 2217) Weight:  [179 lb 7.3 oz (81.4 kg)] 179 lb 7.3 oz (81.4 kg) (02/01 0227)  Wt Readings from Last 3 Encounters:  01/21/12 179 lb 7.3 oz (81.4 kg)  11/15/11 180 lb (81.647 kg)  10/27/11 177 lb (80.287 kg)    Intake/Output Summary (Last 24 hours) at 01/21/12 0931 Last data filed at 01/21/12 0617  Gross per 24 hour  Intake    700 ml  Output    825 ml  Net   -125 ml     Current Facility-Administered Medications  Medication Dose Route Frequency Provider Last Rate Last Dose  . ALPRAZolam (XANAX) 0.5 MG tablet           . ALPRAZolam Prudy Feeler) tablet 1 mg  1 mg Oral Once Otilio Miu, PA   1 mg at 01/20/12 1737  . ALPRAZolam Prudy Feeler) tablet 1 mg  1 mg Oral QID Dessa Phi, MD   1 mg at 01/20/12 2108  . cefTRIAXone (ROCEPHIN) 1 g in dextrose 5 % 50 mL IVPB  1 g Intravenous Once Otilio Miu, PA   1 g at 01/20/12 1645  . Chlorhexidine Gluconate Cloth 2 % PADS 6 each  6 each Topical Q0600 Leighton Roach  McDiarmid, MD      . divalproex (DEPAKOTE ER) 24 hr tablet 750 mg  750 mg Oral BID Dessa Phi, MD   750 mg at 01/20/12 2201  . enoxaparin (LOVENOX) injection 40 mg  40 mg Subcutaneous Q24H Josalyn Funches, MD   40 mg at 01/20/12 2201  . Fluticasone-Salmeterol (ADVAIR) 500-50 MCG/DOSE inhaler 1 puff  1 puff Inhalation BID Dessa Phi, MD   1 puff at 01/20/12 2214  . furosemide (LASIX) injection 40 mg  40 mg Intravenous Once [employer], PA   40 mg at 01/20/12 1410  . gabapentin (NEURONTIN) tablet 600 mg  600 mg Oral TID Dessa Phi, MD   600 mg at 01/20/12 2201  . LORazepam (ATIVAN) injection 1 mg  1 mg Intravenous Once Otilio Miu, PA   1 mg at 01/20/12 1632  . metFORMIN (GLUCOPHAGE) tablet 1,000 mg  1,000 mg Oral BID WC Josalyn Funches, MD   1,000 mg at 01/21/12 0610  . morphine 4 MG/ML injection 4 mg  4 mg Intravenous Once Otilio Miu, PA   4 mg at 01/20/12 1612  . mupirocin ointment (BACTROBAN) 2 % 1 application  1 application Nasal BID Leighton Roach  McDiarmid, MD      . mupirocin ointment (BACTROBAN) 2 %   Nasal BID Josalyn Funches, MD      . nitroGLYCERIN (NITROGLYN) 2 % ointment 1 inch  1 inch Topical Once [place] Bailiff, MD      . oxyCODONE (OXYCONTIN) 12 hr tablet 20 mg  20 mg Oral Q12H Josalyn Funches, MD   20 mg at 01/20/12 2108  . pantoprazole (PROTONIX) EC tablet 40 mg  40 mg Oral Q1200 Josalyn Funches, MD      . polyethylene glycol (MIRALAX / GLYCOLAX) packet 17 g  17 g Oral Daily Josalyn Funches, MD      . potassium chloride SA (K-DUR,KLOR-CON) CR tablet 20 mEq  20 mEq Oral BID Dessa Phi, MD   20 mEq at 01/20/12 2108  . QUEtiapine (SEROQUEL XR) 24 hr tablet 300 mg  300 mg Oral QHS Josalyn Funches, MD   300 mg at 01/20/12 2201  . tiotropium (SPIRIVA) inhalation capsule 18 mcg  18 mcg Inhalation Daily Josalyn Funches, MD      . torsemide (DEMADEX) tablet 80 mg  80 mg Oral BID Josalyn Funches, MD      . vancomycin (VANCOCIN) IVPB 1000 mg/200 mL  premix  1,000 mg Intravenous Once Dayton Bailiff, MD   1,000 mg at 01/20/12 1357     PE: Gen:NAD  HEENT: MMM CV: RRR, no m/r/g OZH:YQMV, mild increased work of breathing, no tachypnea, on 1.5L [place] Abd: Soft non-tender Ext/Musc: 3+ LE pitting edema bilaterally. Mild erythema on L. Pain on moderate palpation of LLE. Unable to bend greater than approximately 15 degrees by self, but 90 degrees w/ passive ROM w/o pain. Rt hand dressing CDI Neuro: Anxious  Labs/Studies:  Basic Metabolic Panel:    Component Value Date/Time   NA 134* 01/21/2012 0500   K 4.5 01/21/2012 0500   CL 94* 01/21/2012 0500   CO2 35* 01/21/2012 0500   BUN 7 01/21/2012 0500   CREATININE 0.44* 01/21/2012 0500   CREATININE 0.73 03/24/2011 1614   GLUCOSE 148* 01/21/2012 0500   CALCIUM 8.9 01/21/2012 0500   Lab Results  Component Value Date   CKTOTAL PENDING 01/21/2012   CKMB 1.4 01/21/2012   TROPONINI <0.30 01/21/2012   CBC:    Component Value Date/Time   WBC 4.7 01/21/2012 0500   HGB 11.2* 01/21/2012 0500   HCT 36.4 01/21/2012 0500   PLT 185 01/21/2012 0500   MCV 97.3 01/21/2012 0500   NEUTROABS 3.1 01/20/2012 1235   LYMPHSABS 2.0 01/20/2012 1235   MONOABS 0.5 01/20/2012 1235   EOSABS 0.1 01/20/2012 1235   BASOSABS 0.0 01/20/2012 1235    EKG: nonspecific ST and T waves changes, sinus tachycardia.    Assessment/Plan: [name] is a 71 y.o. F w/ continued bilateral LE edema.   1. LE edema: Likely multifactorial from worsening chronic venous insufficiency and mild diastolic congestive heart failure. Dopplers negative for DVT. Net down , but has only gotten Lasix x1at 1400 yesterday.  - continue torsemide 80 BID   - continue KDur  - continue strict ins and outs and daily weights. Maintain Foley catheter to accurately quantify urine output.  - Echo today to evaluate Cardiac status.  - F/u w/ 3rd troponins  -Keep legs elevated  -F/u Sed rate  -Arterial duplex pending to rule out arterial insufficiency prior to applying a  compression i.e. Unna boots.  - will monitor LLE pain erythema for cellulitis, but unlikely given Vanc and Rocephin treatments yesterday.  -  PT/OT today  2. CV: Previous h/o diastolic HF. Possibly contributing to LE edema. troponins negative x2. BNP 516 on admission.  - Echo today - continue torsemide  3. Polypharmacy: Patient taking multiple medications for pain anxiety, bipolar depression, insomnia and chronic pain. Continue to report being anxious and needing pain medications.  - Will continue to evaluate med list w/ pharmacy and make changes.   4. COPD: Doing well on 1.5L [place]. Home O2 of 2-3L - Continue O2 as needed. - continue spiriva  4. R hand wound: Wound dressing CDI.  - Continue current wound care BID per wound care team   5. FEN/GI: Tolerating carb modified diet.  - Contiue carb modified diet - Daily CBGs.  - continue metformin  6. Prophylaxis: Lovenox 40 mg [place] q daily.   7. Disposition: SNF pending work-up and placement by SW (family declining returin to [facility]).        Signed: Shelly Flatten, MD Family Medicine Resident PGY-1 01/21/2012 9:09 AM

## 2012-01-21 NOTE — Consult Note (Signed)
WOC consult Note Reason for Consult: Consult requested for right hand wound. Pt fell prior to admission  And has a skin tear to right hand. Wound type: Partial thickness Measurement: 4X6X.1cm cresent shaped skin tear, 2X.1X.1cm Wound bed: Pink and dry, skin approximated over 90% of wounds. Drainage (amount, consistency, odor) Scant pink, no odor Dressing procedure/placement/frequency: Foam dressing to protect and promote healing. Will not plan to follow further unless re-consulted.  [address] RN, MSN, [employer]  [phone]

## 2012-01-21 NOTE — Progress Notes (Signed)
Pt with 2Decho w/contast today.  Dr Sondra Come made aware.  Instructed will d/c metformin and start pt on SSI.  Amanda Pea, RN

## 2012-01-21 NOTE — Progress Notes (Signed)
Clinical Child psychotherapist (CSW) completed psychosocial assessment with pt. Pt at bedside who was alert however appeared to have some confusion at times. Pt confirmed she was from [facility] however did not believe she wanted to return due to falling at the facility. CSW observed that pt has a Comptroller as pt behavior has been inappropriate. CSW also spoke to [facility] who stated pt behavior is inappropriate at the facility and they have concerns with pt returning back when dc. CSW informed that pt constantly screams when wanting something, scares other residents with her yelling out and also stated that before pt was admitted she continued to scream in physical therapy disturbed residents in therapy. CSW also spoke to pt RN who suggested a psych consult. CSW spoke with pt daugter [name] [phone] who stated she did not want pt to return to [facility] and is agreeable to CSW faxing her out to other facilities.  Theresia Bough, MSW, Theresia Majors [phone]

## 2012-01-21 NOTE — H&P (Signed)
I have seen and examined this patient. I have discussed with Dr Armen Pickup.  I agree with their findings and plans as documented in their admission note for today.  Acute Issues 1. Leg pain, bilateral -Stocking distribuion, (+) 1-2 bilateral edema L > R, No consistent tenderness to palpation of legs, faint background erythema of left anterior foreleg.  No skin deficits. Good Cap refill. No joint swelling. - Recent right Achilles tendon lengthening procedure -Good passive ROM bilateral knees without pain. -limited right AROM secondary to pain  - Afebrile, no leukocytosis, ESR = 12 mm/hr WNL. - Venous compression dopplers without evidence of DVT. - Working diagnosis of diabetic neuropathic pain. Other possiblities of pain from other causes of neuropathy,  leg edema from chronic venous insufficiency, osteoathritis  2. "Calling Out" behavior - This is a behavior that [name] has demonstrated on prior hospitalizations.  I suspect that some of her SNF psychotropic medications may be targeting this behavior, including the recent Valproate and addition of Abilify to her Zyprexa that may have lead to over sedation.   Plan: - Will restart her immediate release oxycodone, and continue her scheduled oxycontin.  Check Valproate trough level Will continue her home Zyprexa, may titrate up. Continue Alprazolam 1 mg QID scheduled  Will check response to torsemide. Restart Effexor XR for chronic pain treatment.  Restart Trazodone 150 mg at bedtime if LOC allows Continue Gabapentin for neuropathic pain, may titrate up is LOC allows.  -Check A1c, TSH, Vit B12 -

## 2012-01-22 LAB — BASIC METABOLIC PANEL
CO2: 35 mEq/L — ABNORMAL HIGH (ref 19–32)
Chloride: 95 mEq/L — ABNORMAL LOW (ref 96–112)
Glucose, Bld: 128 mg/dL — ABNORMAL HIGH (ref 70–99)
Potassium: 3.8 mEq/L (ref 3.5–5.1)
Sodium: 138 mEq/L (ref 135–145)

## 2012-01-22 LAB — COMPREHENSIVE METABOLIC PANEL
AST: 10 U/L (ref 0–37)
Alkaline Phosphatase: 50 U/L (ref 39–117)
CO2: 33 mEq/L — ABNORMAL HIGH (ref 19–32)
Chloride: 92 mEq/L — ABNORMAL LOW (ref 96–112)
Creatinine, Ser: 0.45 mg/dL — ABNORMAL LOW (ref 0.50–1.10)
GFR calc non Af Amer: >90 mL/min (ref >90)
Potassium: 3.6 mEq/L (ref 3.5–5.1)
Total Bilirubin: 0.2 mg/dL — ABNORMAL LOW (ref 0.3–1.2)

## 2012-01-22 LAB — GLUCOSE, CAPILLARY
Glucose-Capillary: 136 mg/dL — ABNORMAL HIGH (ref 70–99)
Glucose-Capillary: 192 mg/dL — ABNORMAL HIGH (ref 70–99)
Glucose-Capillary: 218 mg/dL — ABNORMAL HIGH (ref 70–99)

## 2012-01-22 LAB — HEMOGLOBIN A1C: Mean Plasma Glucose: 151 mg/dL — ABNORMAL HIGH (ref <117)

## 2012-01-22 MED ORDER — TRAZODONE HCL 150 MG PO TABS
150.0000 mg | ORAL_TABLET | Freq: Every day | ORAL | Status: DC
  Filled 2012-01-22: qty 1

## 2012-01-22 MED ORDER — GABAPENTIN 300 MG PO CAPS
900.0000 mg | ORAL_CAPSULE | Freq: Three times a day (TID) | ORAL | Status: DC
  Administered 2012-01-22 – 2012-01-31 (×28): 900 mg via ORAL
  Filled 2012-01-22 (×30): qty 3

## 2012-01-22 MED ORDER — GABAPENTIN 600 MG PO TABS: 900.0000 mg | ORAL_TABLET | Freq: Three times a day (TID) | ORAL | Status: DC

## 2012-01-22 NOTE — Evaluation (Signed)
Physical Therapy Evaluation Patient Details Name: [name] MRN: [id number] DOB: Jun 11, 1941 Today's Date: 01/22/2012  Problem List:  Patient Active Problem List  Diagnoses  . NEOPLASM UNCERTAIN BEHAVIOR MAJOR SALIV GLANDS  . DIABETES MELLITUS, TYPE II  . HYPERLIPIDEMIA  . OBESITY, NOS  . ANEMIA-NOS  . BIPOLAR DISORDER  . ANXIETY  . INSOMNIA, CHRONIC  . Other chronic pain  . HYPERTENSION, BENIGN SYSTEMIC  . PULMONARY EMBOLISM  . COR PULMONALE  . DEEP VENOUS THROMBOPHLEBITIS, LEG, LEFT  . VENOUS INSUFFICIENCY, CHRONIC  . COPD  . PULMONARY NODULE, SOLITARY  . GERD  . OSTEOPENIA  . SYMPTOM, INCONTINENCE, URINARY NOS  . TOBACCO ABUSE, HX OF  . GREENFIELD FILTER INSERTION, HX OF  . Wheelchair dependence  . Leg swelling  . Chronic right shoulder pain  . Ankle arthritis  . Candida albicans infection  . Thrush  . Skin pustule(s)  . Tachycardia  . Skin tear    Past Medical History:  Past Medical History  Diagnosis Date  . Anxiety   . Arthritis   . COPD (chronic obstructive pulmonary disease)   . GERD (gastroesophageal reflux disease)   . Hyperlipidemia   . Osteoporosis   . DVT (deep venous thrombosis)   . PE (pulmonary thromboembolism)   . Hypertension      PT DENIES....ON NO  MEDS  . Diabetes mellitus   . Cancer 1969    cervical  . Pneumonia     h/o  . Bronchitis     h/o  . Blood transfusion   . Anemia   . Bipolar affect, depressed    Past Surgical History:  Past Surgical History  Procedure Date  . Vena cava filter placement   . 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 Eye surgery     CATARACTS  . Achilles tendon lengthening 11/18/11    and repair w/posterior tibial tendon lengthening; right  foot  . Cholecystectomy   . Abdominal hysterectomy 1969    "womb taken out for cervical cancer"  . Cataract extraction, bilateral ~ 2008  . Tonsillectomy     "as a child"  . Achilles tendon surgery 11/18/2011    Procedure: ACHILLES TENDON REPAIR;   Surgeon: Toni Arthurs, MD;  Location: [facility] OR;  Service: Orthopedics;  Laterality: Right;  Right Posterior Tibial Tendon Lenghtening and Tendon Achilles Lenghtening     PT Assessment/Plan/Recommendation PT Assessment Clinical Impression Statement: pt is a 71 y/o female admitted from SNF with Bil LE swelling and pain.  On eval patient is weak and deconditioned, as well as limited in mobility due to pain.  pt could benefit from further PT at SNF to address these deficits. PT Recommendation/Assessment: Patient will need skilled PT in the acute care venue PT Problem List: Decreased strength;Decreased activity tolerance;Decreased balance;Decreased mobility;Decreased cognition;Decreased safety awareness;Pain PT Therapy Diagnosis : Difficulty walking;Acute pain;Generalized weakness PT Plan PT Frequency: Min 3X/week PT Treatment/Interventions: Gait training;DME instruction;Stair training;Therapeutic activities;Therapeutic exercise;Balance training;Patient/family education PT Recommendation Follow Up Recommendations: Skilled nursing facility Equipment Recommended: Defer to next venue PT Goals  Acute Rehab PT Goals PT Goal Formulation: With patient Time For Goal Achievement: 7 days Pt will go Supine/Side to Sit: with supervision PT Goal: Supine/Side to Sit - Progress: Goal set today Pt will go Sit to Stand: with supervision PT Goal: Sit to Stand - Progress: Goal set today Pt will Transfer Bed to Chair/Chair to Bed: with supervision PT Transfer Goal: Bed to Chair/Chair to Bed - Progress:  Goal set today Pt will Ambulate: >150 feet;with supervision;with least restrictive assistive device PT Goal: Ambulate - Progress: Goal set today  PT Evaluation Precautions/Restrictions  Precautions Precautions: Fall Prior Functioning  Home Living Lives With: Other (Comment) (at SNF and daughter prior to tendon release surgery) Receives Help From: Family;Personal care attendant Type of Home: Skilled Nursing  Facility Prior Function Level of Independence: Needs assistance with ADLs;Needs assistance with tranfers;Needs assistance with gait Cognition Cognition Arousal/Alertness: Awake/alert Overall Cognitive Status: Impaired Orientation Level: Oriented to person;Oriented to place;Oriented to situation Sensation/Coordination Coordination Gross Motor Movements are Fluid and Coordinated: No Coordination and Movement Description: movements are stiff Extremity Assessment RLE Assessment RLE Assessment: Within Functional Limits LLE Assessment LLE Assessment: Within Functional Limits (general weaknes at ~4/5) Mobility (including Balance) Bed Mobility Supine to Sit: 4: Min assist;With rails;HOB elevated (Comment degrees) (25 degrees) Supine to Sit Details (indicate cue type and reason): vc's for technique, hand placement; truncal assist Sitting - Scoot to Edge of Bed: 3: Mod assist Transfers Transfers: Yes Sit to Stand: 4: Min assist;With upper extremity assist;From bed Sit to Stand Details (indicate cue type and reason): vc for hand placement Stand to Sit: 4: Min assist;To chair/3-in-1;With armrests Stand to Sit Details: vc's for hand placement Ambulation/Gait Ambulation/Gait: Yes Ambulation/Gait Assistance: 4: Min assist Ambulation/Gait Assistance Details (indicate cue type and reason): tc for postural care,  Ambulation Distance (Feet): 80 Feet Assistive device: Rolling walker Gait Pattern: Step-to pattern;Decreased step length - left;Decreased stance time - right;Trunk flexed Stairs: No  Posture/Postural Control Posture/Postural Control: No significant limitations Balance Balance Assessed: Yes Static Sitting Balance Static Sitting - Balance Support: Right upper extremity supported;Left upper extremity supported;Feet supported Static Sitting - Level of Assistance: 5: Stand by assistance Exercise    End of Session PT - End of Session Activity Tolerance: Patient tolerated treatment  well;Patient limited by pain Patient left: in bed;with call bell in reach Nurse Communication: Mobility status for ambulation;Mobility status for transfers General Behavior During Session: [facility] for tasks performed (a bit agitated with concerns of pain) Cognition: Impaired (labile and tangetial of thought)  Siddhartha Hoback, Eliseo Gum 01/22/2012, 3:55 PM  01/22/2012  Papaikou Bing, PT [phone] (pager)

## 2012-01-22 NOTE — Progress Notes (Signed)
Family Medicine Teaching Service [facility] Progress Note  Patient name: [name] Medical record number: [id number] Date of birth: 1941-03-07 Age: 71 y.o. Gender: female    LOS: 2 days   Primary Care Provider: Ellery Plunk, MD, MD  no o/n issues.   Objective: Vital signs in last 24 hours: Temp:  [97.8 F (36.6 C)-98.8 F (37.1 C)] 97.9 F (36.6 C) (02/02 0400) Pulse Rate:  [98-128] 98  (02/02 0400) Resp:  [20-22] 20  (02/02 0400) BP: (110-163)/(60-103) 110/60 mmHg (02/02 0400) SpO2:  [93 %-99 %] 95 % (02/02 0917) Weight:  [168 lb 14 oz (76.6 kg)] 168 lb 14 oz (76.6 kg) (02/02 0400)  Wt Readings from Last 3 Encounters:  01/22/12 168 lb 14 oz (76.6 kg)  11/15/11 180 lb (81.647 kg)  10/27/11 177 lb (80.287 kg)    Intake/Output Summary (Last 24 hours) at 01/22/12 1048 Last data filed at 01/22/12 0851  Gross per 24 hour  Intake    440 ml  Output   5350 ml  Net  -4910 ml     Current Facility-Administered Medications  Medication Dose Route Frequency Provider Last Rate Last Dose  . ALPRAZolam (XANAX) tablet 1 mg  1 mg Oral QID Dessa Phi, MD   1 mg at 01/22/12 1001  . Chlorhexidine Gluconate Cloth 2 % PADS 6 each  6 each Topical Q0600 Leighton Roach McDiarmid, MD   6 each at 01/21/12 2200  . divalproex (DEPAKOTE ER) 24 hr tablet 750 mg  750 mg Oral BID Josalyn Funches, MD   750 mg at 01/22/12 1001  . enoxaparin (LOVENOX) injection 40 mg  40 mg Subcutaneous Q24H Josalyn Funches, MD   40 mg at 01/21/12 2200  . Fluticasone-Salmeterol (ADVAIR) 500-50 MCG/DOSE inhaler 1 puff  1 puff Inhalation BID Dessa Phi, MD   1 puff at 01/22/12 0914  . gabapentin (NEURONTIN) tablet 600 mg  600 mg Oral TID Dessa Phi, MD   600 mg at 01/22/12 1002  . haloperidol (HALDOL) tablet 0.5 mg  0.5 mg Oral QHS PRN Ivy Tye Savoy, MD      . insulin aspart (novoLOG) injection 0-15 Units  0-15 Units Subcutaneous TID WC Ivy Tye Savoy, MD   2 Units at 01/22/12 [phone]  . insulin aspart (novoLOG)  injection 0-5 Units  0-5 Units Subcutaneous QHS Ivy de Lawson Radar, MD      . mupirocin ointment (BACTROBAN) 2 % 1 application  1 application Nasal BID Leighton Roach McDiarmid, MD   1 application at 01/21/12 2200  . mupirocin ointment (BACTROBAN) 2 %   Nasal BID Josalyn Funches, MD      . nitroGLYCERIN (NITROGLYN) 2 % ointment 1 inch  1 inch Topical Once Dayton Bailiff, MD      . oxyCODONE (Oxy IR/ROXICODONE) immediate release tablet 5 mg  5 mg Oral Q6H PRN Leighton Roach McDiarmid, MD   5 mg at 01/21/12 1302  . oxyCODONE (OXYCONTIN) 12 hr tablet 20 mg  20 mg Oral Q12H Josalyn Funches, MD   20 mg at 01/22/12 1001  . pantoprazole (PROTONIX) EC tablet 40 mg  40 mg Oral Q1200 Josalyn Funches, MD   40 mg at 01/22/12 1001  . polyethylene glycol (MIRALAX / GLYCOLAX) packet 17 g  17 g Oral Daily Josalyn Funches, MD   17 g at 01/22/12 1014  . potassium chloride SA (K-DUR,KLOR-CON) CR tablet 20 mEq  20 mEq Oral BID Dessa Phi, MD   20 mEq at 01/21/12 2200  .  QUEtiapine (SEROQUEL XR) 24 hr tablet 300 mg  300 mg Oral QHS Josalyn Funches, MD   300 mg at 01/21/12 2200  . tiotropium (SPIRIVA) inhalation capsule 18 mcg  18 mcg Inhalation Daily Dessa Phi, MD   18 mcg at 01/22/12 0914  . torsemide (DEMADEX) tablet 80 mg  80 mg Oral BID Shelly Flatten, MD   80 mg at 01/22/12 0818  . DISCONTD: metFORMIN (GLUCOPHAGE) tablet 1,000 mg  1,000 mg Oral BID WC Josalyn Funches, MD   1,000 mg at 01/21/12 1604   Filed Vitals:   01/21/12 2000 01/21/12 2122 01/22/12 0400 01/22/12 0917  BP: 127/62  110/60   Pulse: 102  98   Temp: 97.8 F (36.6 C)  97.9 F (36.6 C)   TempSrc: Oral  Axillary   Resp: 20  20   Weight:   168 lb 14 oz (76.6 kg)   SpO2: 99% 98% 93% 95%   PE: Gen:NAD  HEENT: MMM CV: RRR, no m/r/g Lungs:  Normal respiratory effort, chest expands symmetrically. Lungs are clear to auscultation, no crackles or wheezes. Abd: Soft non-tender Ext/Musc: 3+ LE pitting edema bilaterally. Mild erythema on L. Pain on moderate  palpation of LLE. Neuro: Anxious  Labs/Studies:  Basic Metabolic Panel:    Component Value Date/Time   NA 138 01/22/2012 0500   K 3.8 01/22/2012 0500   CL 95* 01/22/2012 0500   CO2 35* 01/22/2012 0500   BUN 8 01/22/2012 0500   CREATININE 0.48* 01/22/2012 0500   CREATININE 0.73 03/24/2011 1614   GLUCOSE 128* 01/22/2012 0500   CALCIUM 8.8 01/22/2012 0500   Lab Results  Component Value Date   CKTOTAL 39 01/21/2012   CKMB 1.5 01/21/2012   TROPONINI <0.30 01/21/2012   CBC:    Component Value Date/Time   WBC 4.7 01/21/2012 0500   HGB 11.2* 01/21/2012 0500   HCT 36.4 01/21/2012 0500   PLT 185 01/21/2012 0500   MCV 97.3 01/21/2012 0500   NEUTROABS 3.1 01/20/2012 1235   LYMPHSABS 2.0 01/20/2012 1235   MONOABS 0.5 01/20/2012 1235   EOSABS 0.1 01/20/2012 1235   BASOSABS 0.0 01/20/2012 1235   EKG: nonspecific ST and T waves changes, sinus tachycardia.  Assessment/Plan: [name] is a 71 y.o. F w/ continued bilateral LE edema.   1. LE edema: Likely multifactorial from worsening chronic venous insufficiency and mild diastolic congestive heart failure. Dopplers negative for DVT. - continue torsemide 80 BID   - continue KDur  - continue strict ins and outs and daily weights. Maintain Foley catheter to accurately quantify urine output.  - PT/OT   2. CV: Previous h/o diastolic HF. Possibly contributing to LE edema. troponins negative x3 BNP 516 on admission.  - Echo 60-75% - continue torsemide  3. Polypharmacy: Patient taking multiple medications for pain anxiety, bipolar depression, insomnia and chronic pain. Continue to report being anxious and needing pain medications.  - Will continue to evaluate med list w/ pharmacy and make changes.   4. COPD: Doing well on 1.5L [place]. Home O2 of 2-3L - Continue O2 as needed. - continue spiriva  4. R hand wound: Wound dressing CDI.  - Continue current wound care BID per wound care team   5. FEN/GI: Tolerating carb modified diet.  - Contiue carb modified diet - Daily  CBGs.  - metformin dc'd SSI started  6. Prophylaxis: Lovenox 40 mg [place] q daily.   7. Disposition: SNF pending work-up and placement by SW (family declining return to [facility]).   SignedEdd Arbour, MD 01/22/2012 10:48 AM

## 2012-01-22 NOTE — Progress Notes (Signed)
I have seen and examined this patient. I have discussed with Dr Rivka Safer.  I agree with their findings and plans as documented in their progress note for today.  Acute Issues  1. Leg pain, bilateral - Improved, though patient continues to ask for increase in her opiate pain medication. - Will increase her Gabapentin to 900 mg TID  From 600 mg TID and monitor for tolerance and improvement in leg pain. Continue Oxycontin 20 m)g BID and oxycodone IR 5 mg QID PRN (patient took just one dose oxycodone IR 5 mg po yesterday  2. Leg Edema, bilateral - Improved. Improvement in leg pain appears to correspond to reduction in edema with active diuresis.   - Await Echocardiogram interpretation.   3. "Calling Out" behavior - Some improvement with in-room sitter. - Checking trough Valproate trough level before this tomorrow morning 1000 hrs dose.  - Continue Zyprexa at current dose. Continue Xanax 1 mg QID.  - Will consult Psychiatry for suggestions for pharmacologic management of behavior that was problematic for patient at her SNF. -Will need to restart her home Effexor XR and Trazodone if tolerates the increase in her Gabapentin dose without excess sedation.

## 2012-01-22 NOTE — Progress Notes (Signed)
Family Medicine MD notified of HR 130-140.  No new orders noted.  Client at that time was agitated and anxious before 10 am meds.  At this time she is up in chair and although calling out at times she is calmer and quieter, more cooperative.  Spoke with charge nurse regarding someone sitting with her.  Zella Ball called a niece for her.  Daughter called prior to am med about repositioning her, however, that had already been done.  Currently up in chair.  Monitoring fluids. Thanks

## 2012-01-22 NOTE — Progress Notes (Signed)
Occupational Therapy Evaluation Patient Details Name: [name] MRN: [id number] DOB: 1941-05-03 Today's Date: 01/22/2012  Problem List:  Patient Active Problem List  Diagnoses  . NEOPLASM UNCERTAIN BEHAVIOR MAJOR SALIV GLANDS  . DIABETES MELLITUS, TYPE II  . HYPERLIPIDEMIA  . OBESITY, NOS  . ANEMIA-NOS  . BIPOLAR DISORDER  . ANXIETY  . INSOMNIA, CHRONIC  . Other chronic pain  . HYPERTENSION, BENIGN SYSTEMIC  . PULMONARY EMBOLISM  . COR PULMONALE  . DEEP VENOUS THROMBOPHLEBITIS, LEG, LEFT  . VENOUS INSUFFICIENCY, CHRONIC  . COPD  . PULMONARY NODULE, SOLITARY  . GERD  . OSTEOPENIA  . SYMPTOM, INCONTINENCE, URINARY NOS  . TOBACCO ABUSE, HX OF  . GREENFIELD FILTER INSERTION, HX OF  . Wheelchair dependence  . Leg swelling  . Chronic right shoulder pain  . Ankle arthritis  . Candida albicans infection  . Thrush  . Skin pustule(s)  . Tachycardia  . Skin tear    Past Medical History:  Past Medical History  Diagnosis Date  . Anxiety   . Arthritis   . COPD (chronic obstructive pulmonary disease)   . GERD (gastroesophageal reflux disease)   . Hyperlipidemia   . Osteoporosis   . DVT (deep venous thrombosis)   . PE (pulmonary thromboembolism)   . Hypertension      PT DENIES....ON NO  MEDS  . Diabetes mellitus   . Cancer 1969    cervical  . Pneumonia     h/o  . Bronchitis     h/o  . Blood transfusion   . Anemia   . Bipolar affect, depressed    Past Surgical History:  Past Surgical History  Procedure Date  . Vena cava filter placement   . 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 Eye surgery     CATARACTS  . Achilles tendon lengthening 11/18/11    and repair w/posterior tibial tendon lengthening; right  foot  . Cholecystectomy   . Abdominal hysterectomy 1969    "womb taken out for cervical cancer"  . Cataract extraction, bilateral ~ 2008  . Tonsillectomy     "as a child"  . Achilles tendon surgery 11/18/2011    Procedure: ACHILLES TENDON REPAIR;   Surgeon: Toni Arthurs, MD;  Location: [facility] OR;  Service: Orthopedics;  Laterality: Right;  Right Posterior Tibial Tendon Lenghtening and Tendon Achilles Lenghtening     OT Assessment/Plan/Recommendation OT Assessment Clinical Impression Statement: 71 yo admitted for c/o B leg pain. PT is currently being seen by psych to adjust medications which may be amplifying her pain and behavioral issues. Pt will benefit from OT in the acute setting to assist in continuing to assess her functional level as her medications are regulated and reach below established goals. Pt will need SNF for continued rehab after D/C. OT Recommendation/Assessment: Patient will need skilled OT in the acute care venue OT Problem List: Decreased strength;Decreased activity tolerance;Impaired balance (sitting and/or standing);Decreased cognition;Decreased safety awareness;Decreased knowledge of use of DME or AE;Pain;Increased edema Barriers to Discharge: Decreased caregiver support OT Therapy Diagnosis : Cognitive deficits;Generalized weakness;Altered mental status OT Plan OT Frequency: Min 1X/week OT Treatment/Interventions: Self-care/ADL training;Therapeutic exercise;Energy conservation;DME and/or AE instruction;Therapeutic activities;Cognitive remediation/compensation;Patient/family education;Balance training OT Recommendation Follow Up Recommendations: Skilled nursing facility Equipment Recommended: Defer to next venue Individuals Consulted Consulted and Agree with Results and Recommendations: Patient unable/family or caregiver not available OT Goals Acute Rehab OT Goals OT Goal Formulation: Patient unable to participate in goal setting Time For Goal Achievement:  2 weeks ADL Goals Pt Will Perform Grooming: Standing at sink;with supervision ADL Goal: Grooming - Progress: Goal set today Pt Will Perform Upper Body Bathing: with supervision;Sitting at sink ADL Goal: Upper Body Bathing - Progress: Goal set today Pt Will  Perform Lower Body Bathing: with mod assist;Sitting at sink;Standing at sink;with cueing (comment type and amount) (min vc for redirection) ADL Goal: Lower Body Bathing - Progress: Goal set today Pt Will Perform Upper Body Dressing: with set-up;with supervision;with cueing (comment type and amount);Sitting, chair ADL Goal: Upper Body Dressing - Progress: Goal set today Pt Will Perform Lower Body Dressing: with mod assist;Sit to stand from bed;with supervision ADL Goal: Lower Body Dressing - Progress: Goal set today Pt Will Transfer to Toilet: with min assist;with DME;Ambulation;3-in-1;with cueing (comment type and amount) (min vc) ADL Goal: Toilet Transfer - Progress: Goal set today Pt Will Perform Toileting - Clothing Manipulation: with min assist;Standing;with cueing (comment type and amount) ADL Goal: Toileting - Clothing Manipulation - Progress: Goal set today Pt Will Perform Toileting - Hygiene: with supervision;with cueing (comment type and amount) ADL Goal: Toileting - Hygiene - Progress: Goal set today  OT Evaluation Precautions/Restrictions  Precautions Precautions: Fall Required Braces or Orthoses: No Restrictions Weight Bearing Restrictions: No Prior Functioning Home Living Lives With: Other (Comment) ([facility] prior to admission, live with daughter bf SNF) Receives Help From: Family;Personal care attendant Type of Home: Skilled Nursing Facility Prior Function Level of Independence: Needs assistance with ADLs;Needs assistance with homemaking;Requires assistive device for independence;Needs assistance with gait;Needs assistance with tranfers Bath: Unable to assess Toileting: Unable to assess Dressing: Unable to assess Homemaking Assistance Comments: total A Able to Take Stairs?: No Driving: No Vocation: Retired ADL ADL Eating/Feeding: Simulated;Modified independent Where Assessed - Eating/Feeding: Chair Grooming: Set up;Supervision/safety;Performed Where Assessed  - Grooming: Standing at sink Upper Body Bathing: Simulated;Minimal assistance Where Assessed - Upper Body Bathing: Standing at sink Lower Body Bathing: Simulated;Maximal assistance Where Assessed - Lower Body Bathing: Sit to stand from bed Upper Body Dressing: Simulated;Minimal assistance Where Assessed - Upper Body Dressing: Sitting, bed Lower Body Dressing: Maximal assistance Where Assessed - Lower Body Dressing: Sit to stand from bed Toilet Transfer: Simulated;Maximal assistance Toilet Transfer Details (indicate cue type and reason): vc for safety Toilet Transfer Method: Ambulating Toilet Transfer Equipment: Bedside commode Toileting - Clothing Manipulation: Moderate assistance Where Assessed - Toileting Clothing Manipulation: Sit to stand from 3-in-1 or toilet;Standing Toileting - Hygiene: Supervision/safety Where Assessed - Toileting Hygiene: Standing Tub/Shower Transfer: Not assessed Tub/Shower Transfer Method: Not assessed Equipment Used: Rolling walker Ambulation Related to ADLs: MinA ADL Comments: Pt with apparent psychoemotional issues. Pt labile throughout session. Unable to accurately assess premorbid functional level. It appears that pt had assisance [facility] and then before with personal care attendant when she lived with daughter. Per converation with MD, medications are beign changed. will follow to assess level of independence as pt is weaned off meds . Pt will need SNF at D/C. Vision/Perception  Vision - History Baseline Vision: Wears glasses all the time Perception Perception: Within Functional Limits Praxis Praxis: Intact Cognition Cognition Arousal/Alertness: Awake/alert Overall Cognitive Status: Impaired Attention: Impaired Current Attention Level: Sustained Memory: Appears impaired Memory Deficits: Poor STM Orientation Level: Oriented to person;Oriented to place;Oriented to situation Safety/Judgement: Decreased awareness of safety  precautions Decreased Safety/Judgement: Other (comment);Impulsive (impulsive at times. Poor judgement) Awareness of Errors: Decreased awareness of errors made Decreased Awareness of Errors: Assistance required to correct errors made Awareness of Deficits: Decreased awareness  of deficits Problem Solving: Requires assistance for problem solving Sensation/Coordination Sensation Light Touch: Appears Intact Stereognosis: Appears Intact Proprioception: Appears Intact Coordination Gross Motor Movements are Fluid and Coordinated: No Fine Motor Movements are Fluid and Coordinated: No Coordination and Movement Description: Apparent dystonia type movements Extremity Assessment RUE Assessment RUE Assessment: Within Functional Limits LUE Assessment LUE Assessment: Within Functional Limits Mobility  Bed Mobility Bed Mobility: Yes Supine to Sit:  (sit - supine with +2) Supine to Sit Details (indicate cue type and reason): vc's for technique, hand placement; truncal assist Sitting - Scoot to Edge of Bed: 3: Mod assist Sit to Supine: 1: +2 Total assist (Pt refused to lie down with only 1 person) Transfers Transfers: Yes Sit to Stand: 3: Mod assist Sit to Stand Details (indicate cue type and reason): lifting assistance form chair Stand to Sit: 4: Min assist (vc for safety) Stand to Sit Details: vc's for hand placement Exercises   End of Session OT - End of Session Equipment Utilized During Treatment: Gait belt Activity Tolerance: Patient limited by pain;Patient limited by fatigue;Treatment limited secondary to agitation Patient left: in bed;with call bell in reach;with bed alarm set Nurse Communication: Mobility status for transfers General Behavior During Session: Agitated Cognition: Impaired   [name],[name] 01/22/2012, 4:57 PM  [name], OTR/L  [phone] 01/22/2012

## 2012-01-22 NOTE — Progress Notes (Signed)
Patient ID: [name], female   DOB: Jun 07, 1941, 71 y.o.   MRN: [id number] Patient Identification:  [name] Date of Evaluation:  01/22/2012 Chief Complaint:Placement capacity   History of Present Illness::  Pt has history of Bipolar Disorder who had been on Zyprexa and is now on Haldol and Seroquel.  She is from a nursing home where her behavior is out of control.  She is also on Xanax 1 mg QID and Oxycodone IR and 12hr forms.  Each of these are addictive, cloud mentation, and lead to disinhibited behaviors often related to seeking more drugs and more drugs without symptom relief.    Past Medical History:     Past Medical History  Diagnosis Date  . Anxiety   . Arthritis   . COPD (chronic obstructive pulmonary disease)   . GERD (gastroesophageal reflux disease)   . Hyperlipidemia   . Osteoporosis   . DVT (deep venous thrombosis)   . PE (pulmonary thromboembolism)   . Hypertension      PT DENIES....ON NO  MEDS  . Diabetes mellitus   . Cancer 1969    cervical  . Pneumonia     h/o  . Bronchitis     h/o  . Blood transfusion   . Anemia   . Bipolar affect, depressed        Past Surgical History  Procedure Date  . Vena cava filter placement   . 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 Eye surgery     CATARACTS  . Achilles tendon lengthening 11/18/11    and repair w/posterior tibial tendon lengthening; right  foot  . Cholecystectomy   . Abdominal hysterectomy 1969    "womb taken out for cervical cancer"  . Cataract extraction, bilateral ~ 2008  . Tonsillectomy     "as a child"  . Achilles tendon surgery 11/18/2011    Procedure: ACHILLES TENDON REPAIR;  Surgeon: Toni Arthurs, MD;  Location: [facility] OR;  Service: Orthopedics;  Laterality: Right;  Right Posterior Tibial Tendon Lenghtening and Tendon Achilles Lenghtening     Allergies:  Allergies  Allergen Reactions  . Sulfamethoxazole W/Trimethoprim Hives  . Zyprexa     Hallucinations and disorientation    Current  Medications:  Prior to Admission medications   Medication Sig Start Date End Date Taking? Authorizing Provider  ALPRAZolam Prudy Feeler) 1 MG tablet Take 1 tablet (1 mg total) by mouth 4 (four) times daily. Fill 7/13 06/02/11 06/01/12 Yes Ellery Plunk, MD  ARIPiprazole (ABILIFY) 5 MG tablet Take 5 mg by mouth at bedtime.   Yes Historical Provider, MD  divalproex (DEPAKOTE ER) 250 MG 24 hr tablet Take 750 mg by mouth 2 (two) times daily.    Yes Historical Provider, MD  ENSURE (ENSURE) Take 1 Can by mouth 3 (three) times daily with meals.     Yes Historical Provider, MD  Fluticasone-Salmeterol (ADVAIR DISKUS) 500-50 MCG/DOSE AEPB Inhale 1 puff into the lungs 2 (two) times daily. 08/25/11  Yes Ellery Plunk, MD  gabapentin (NEURONTIN) 600 MG tablet Take 600 mg by mouth 3 (three) times daily.    Yes Historical Provider, MD  Melatonin 3 MG CAPS Take 6 mg by mouth at bedtime.   Yes Historical Provider, MD  metFORMIN (GLUCOPHAGE) 1000 MG tablet Take 1 tablet (1,000 mg total) by mouth 2 (two) times daily with a meal. 06/02/11  Yes Ellery Plunk, MD  metoprolol tartrate (LOPRESSOR) 25 MG tablet Take 0.5 tablets (12.5 mg total)  by mouth 2 (two) times daily. 12/27/11 12/26/12 Yes Ellery Plunk, MD  nystatin-triamcinolone (MYCOLOG II) cream Apply 1 application topically 4 (four) times daily.     Yes Historical Provider, MD  omeprazole (PRILOSEC) 20 MG capsule Take 20 mg by mouth daily.   Yes Historical Provider, MD  oxycodone (OXY-IR) 5 MG capsule Take 5 mg by mouth every 4 (four) hours as needed.   Yes Historical Provider, MD  oxyCODONE (OXYCONTIN) 20 MG 12 hr tablet Take 1 tablet (20 mg total) by mouth every 12 (twelve) hours. Fill 30 days from date on RX 10/20/11 10/19/12 Yes Ellery Plunk, MD  polyethylene glycol [facility] / GLYCOLAX) packet Take 17 g by mouth daily.     Yes Historical Provider, MD  potassium chloride (K-DUR,KLOR-CON) 10 MEQ tablet Take 5-10 mEq by mouth 2 (two) times daily. Takes in the am and  5 meq at bedtime 08/25/11  Yes Ellery Plunk, MD  promethazine (PHENERGAN) 25 MG tablet Take 25 mg by mouth every 6 (six) hours as needed. nausea   Yes Historical Provider, MD  QUEtiapine (SEROQUEL XR) 300 MG 24 hr tablet Take 300 mg by mouth at bedtime.   Yes Historical Provider, MD  tiotropium (SPIRIVA) 18 MCG inhalation capsule Place 1 capsule (18 mcg total) into inhaler and inhale daily. 08/25/11  Yes Ellery Plunk, MD  torsemide (DEMADEX) 10 MG tablet Take 20 mg by mouth 2 (two) times daily. Takes 2 tablets every morning for 20mg  dosage and takes 1 tablet at night for 10mg  dosage   Yes Historical Provider, MD  trazodone (DESYREL) 300 MG tablet Take 150 mg by mouth at bedtime.    Yes Historical Provider, MD  venlafaxine (EFFEXOR-XR) 150 MG 24 hr capsule Take 300 mg by mouth daily.    Yes Historical Provider, MD  vitamin C (ASCORBIC ACID) 500 MG tablet Take 500 mg by mouth 2 (two) times daily.   Yes Historical Provider, MD    Social History:    reports that she quit smoking about 25 years ago. Her smoking use included Cigarettes. She smoked 1 pack per day. She has never used smokeless tobacco. She reports that she does not drink alcohol or use illicit drugs.   Family History:    History reviewed. No pertinent family history.  Mental Status Examination/Evaluation: Objective:  Appearance: Disheveled  Eye Contact::  Fair  Speech:  Garbled and Pressured  Volume:  Increased  Mood:  Seems in pretty good mood, perhaps too good mood.  Affect:  Congruent  Thought Process:  Disorganized and goal directed only at keeping on her Xanax and Oxycotin  Orientation:  Full  Thought Content:  WDL  Suicidal Thoughts:  No  Homicidal Thoughts:  No  Judgement:  Poor  Insight:  Lacking  Psychomotor Activity:  Decreased  Able to subtract 7 from 100 immediately, but never was able to subtract 7 from 93.  Pt describes moods being better during the sunny months and during sunny weather.  Pt does not describe  mood dysregulation worse during the spring and fall typical of Bipolar Disorder.      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  Perhaps using Elavil, Motrin, Mobic, Lidoderm patches and/or analgesic balm.  2 Consult pharmacist on the most effective way to immediately cover her for seizures so she can be completely and totally stopped Xanax (to clear her mentation) with either Tegretol or possibly Neurontin AND substituting her anxiety management with Thorazine, Neurontin, Vistaril, BuSpar, and/ or Inderal.  3 Mark patient's chart with an allergy to both Opiates (mood dysregulation) and Benzodiazepines (disinhibition and cloudy mentation).  Thank you for the opportunity to consult on this patient.  Will follow to assist with the medica ition transitions and making sure that the patient's chart is marked to prevent her from getting back on the opiates which make her pain perception worse by disrupting the mu receptors and the behavioral disinhibition and cloudy mentation from the Benzos.  Dorian Heckle Dan Humphreys, MD 01/22/2012 16:20

## 2012-01-23 LAB — GLUCOSE, CAPILLARY
Glucose-Capillary: 124 mg/dL — ABNORMAL HIGH (ref 70–99)
Glucose-Capillary: 248 mg/dL — ABNORMAL HIGH (ref 70–99)
Glucose-Capillary: 252 mg/dL — ABNORMAL HIGH (ref 70–99)
Glucose-Capillary: 142 mg/dL — ABNORMAL HIGH (ref 70–99)

## 2012-01-23 MED ORDER — MUPIROCIN 2 % EX OINT
TOPICAL_OINTMENT | CUTANEOUS | Status: DC | PRN
  Filled 2012-01-23: qty 22

## 2012-01-23 MED ORDER — TRAZODONE HCL 150 MG PO TABS
150.0000 mg | ORAL_TABLET | Freq: Every day | ORAL | Status: DC
  Administered 2012-01-23 – 2012-01-29 (×7): 150 mg via ORAL
  Filled 2012-01-23 (×8): qty 1

## 2012-01-23 MED ORDER — TORSEMIDE 20 MG PO TABS
20.0000 mg | ORAL_TABLET | Freq: Two times a day (BID) | ORAL | Status: DC
  Administered 2012-01-23 – 2012-01-28 (×10): 20 mg via ORAL
  Filled 2012-01-23 (×12): qty 1

## 2012-01-23 NOTE — Progress Notes (Signed)
Family Medicine Teaching Service [facility] Progress Note  Patient name: [name] Medical record number: [id number] Date of birth: 10-27-1941 Age: 71 y.o. Gender: female    LOS: 3 days   Primary Care Provider: Ellery Plunk, MD, MD Received haldol x1 last night. Mentioned she has trouble sleeping and asked about her home trazadone which has helped her sleep in the past. Says her lower extremity swelling is improved.   Objective: Vital signs in last 24 hours: Temp:  [97.9 F (36.6 C)-98.4 F (36.9 C)] 97.9 F (36.6 C) (02/03 0600) Pulse Rate:  [97-124] 97  (02/03 0600) Resp:  [18-22] 22  (02/03 0600) BP: (115-132)/(71-83) 115/71 mmHg (02/03 0600) SpO2:  [96 %-98 %] 96 % (02/03 0729) Weight:  [170 lb 6.7 oz (77.3 kg)] 170 lb 6.7 oz (77.3 kg) (02/03 0335)  Wt Readings from Last 3 Encounters:  01/23/12 170 lb 6.7 oz (77.3 kg)  11/15/11 180 lb (81.647 kg)  10/27/11 177 lb (80.287 kg)    Intake/Output Summary (Last 24 hours) at 01/23/12 1221 Last data filed at 01/23/12 1034  Gross per 24 hour  Intake   1340 ml  Output   3250 ml  Net  -1910 ml   24hrs: In: Out: 2650 Total: -690  Current Facility-Administered Medications  Medication Dose Route Frequency Provider Last Rate Last Dose  . ALPRAZolam (XANAX) tablet 1 mg  1 mg Oral QID Dessa Phi, MD   1 mg at 01/23/12 1004  . Chlorhexidine Gluconate Cloth 2 % PADS 6 each  6 each Topical Q0600 Leighton Roach McDiarmid, MD   6 each at 01/22/12 2113  . divalproex (DEPAKOTE ER) 24 hr tablet 750 mg  750 mg Oral BID Dessa Phi, MD   500 mg at 01/23/12 1006  . enoxaparin (LOVENOX) injection 40 mg  40 mg Subcutaneous Q24H Josalyn Funches, MD   40 mg at 01/22/12 2113  . Fluticasone-Salmeterol (ADVAIR) 500-50 MCG/DOSE inhaler 1 puff  1 puff Inhalation BID Dessa Phi, MD   1 puff at 01/22/12 1807  . gabapentin (NEURONTIN) capsule 900 mg  900 mg Oral TID Leighton Roach McDiarmid, MD   900 mg at 01/23/12 1008  . haloperidol (HALDOL)  tablet 0.5 mg  0.5 mg Oral QHS PRN Barnabas Lister, MD   0.5 mg at 01/22/12 2111  . insulin aspart (novoLOG) injection 0-15 Units  0-15 Units Subcutaneous TID WC Ivy Tye Savoy, MD   5 Units at 01/23/12 1151  . insulin aspart (novoLOG) injection 0-5 Units  0-5 Units Subcutaneous QHS Barnabas Lister, MD   2 Units at 01/22/12 2116  . mupirocin ointment (BACTROBAN) 2 % 1 application  1 application Nasal BID Leighton Roach McDiarmid, MD   1 application at 01/23/12 1010  . mupirocin ointment (BACTROBAN) 2 %   Nasal BID Josalyn Funches, MD      . nitroGLYCERIN (NITROGLYN) 2 % ointment 1 inch  1 inch Topical Once [place] Bailiff, MD      . oxyCODONE (Oxy IR/ROXICODONE) immediate release tablet 5 mg  5 mg Oral Q6H PRN Leighton Roach McDiarmid, MD   5 mg at 01/23/12 0609  . oxyCODONE (OXYCONTIN) 12 hr tablet 20 mg  20 mg Oral Q12H Josalyn Funches, MD   20 mg at 01/23/12 1009  . pantoprazole (PROTONIX) EC tablet 40 mg  40 mg Oral Q1200 Josalyn Funches, MD   40 mg at 01/23/12 1151  . polyethylene glycol (MIRALAX / GLYCOLAX) packet 17 g  17 g  Oral Daily Dessa Phi, MD   17 g at 01/22/12 1014  . QUEtiapine (SEROQUEL XR) 24 hr tablet 300 mg  300 mg Oral QHS Josalyn Funches, MD   300 mg at 01/22/12 2108  . tiotropium (SPIRIVA) inhalation capsule 18 mcg  18 mcg Inhalation Daily Dessa Phi, MD   18 mcg at 01/23/12 0728  . torsemide (DEMADEX) tablet 80 mg  80 mg Oral BID Shelly Flatten, MD   80 mg at 01/23/12 0812   Filed Vitals:   01/22/12 2154 01/23/12 0335 01/23/12 0600 01/23/12 0729  BP: 120/83  115/71   Pulse: 110  97   Temp: 98.4 F (36.9 C)  97.9 F (36.6 C)   TempSrc: Oral  Oral   Resp: 22  22   Weight:  170 lb 6.7 oz (77.3 kg)    SpO2: 97%  98% 96%   PE: Gen:NAD  HEENT: MMM CV: RRR, no m/r/g Lungs:  Normal respiratory effort, chest expands symmetrically. Lungs are clear to auscultation, no crackles or wheezes. Abd: Soft non-tender Ext/Musc: 2+ LE pitting edema bilaterally. Tenderness to moderate palpation.   Neuro: Anxious  Labs/Studies:  Basic Metabolic Panel:    Component Value Date/Time   NA 138 01/22/2012 0500   K 3.8 01/22/2012 0500   CL 95* 01/22/2012 0500   CO2 35* 01/22/2012 0500   BUN 8 01/22/2012 0500   CREATININE 0.48* 01/22/2012 0500   CREATININE 0.73 03/24/2011 1614   GLUCOSE 128* 01/22/2012 0500   CALCIUM 8.8 01/22/2012 0500   Lab Results  Component Value Date   CKTOTAL 39 01/21/2012   CKMB 1.5 01/21/2012   TROPONINI <0.30 01/21/2012   CBC:    Component Value Date/Time   WBC 4.7 01/21/2012 0500   HGB 11.2* 01/21/2012 0500   HCT 36.4 01/21/2012 0500   PLT 185 01/21/2012 0500   MCV 97.3 01/21/2012 0500   NEUTROABS 3.1 01/20/2012 1235   LYMPHSABS 2.0 01/20/2012 1235   MONOABS 0.5 01/20/2012 1235   EOSABS 0.1 01/20/2012 1235   BASOSABS 0.0 01/20/2012 1235   EKG: nonspecific ST and T waves changes, sinus tachycardia.  Assessment/Plan: [name] is a 71 y.o. F w/ continued bilateral LE edema.   1. LE edema: Likely multifactorial from worsening chronic venous insufficiency and mild diastolic congestive heart failure. Dopplers negative for DVT. - was on torsemide 80mg  bid, with an increase in bicarb. Will change to torsemide 20mg  bid (home dose is torsemide 20mg  in morning and 10mg  at night).    - continue KDur  - continue strict ins and outs and daily weights. Maintain Foley catheter to accurately quantify urine output.  - PT/OT   2. CV: Previous h/o diastolic HF. Possibly contributing to LE edema. troponins negative x3 BNP 516 on admission.  - Echo 60-75% - torsemide 20mg  po bid  3. Polypharmacy: Patient taking multiple medications for pain anxiety, bipolar depression, insomnia and chronic pain. Continue to report being anxious and needing pain medications.  - psychiatry was consulted and recommended weaning her off xanax and stopping oxycodone. This will likely be difficult given patient's dependence and refusal to change medication regimen. - start back on home dose trazadone and monitor  sedation  4. COPD: Doing well on 2L [place]. Home O2 of 2-3L - Continue O2 as needed. - continue spiriva  4. R hand wound: Wound dressing CDI.  - Continue current wound care BID per wound care team   5. FEN/GI: Tolerating carb modified diet.  - Contiue carb modified  diet - Daily CBGs.  - metformin dc'd SSI started  6. Prophylaxis: Lovenox 40 mg [place] q daily.   7. Disposition: SNF pending work-up and placement by SW (family declining return to [facility]).   Signed: Marena Chancy, MD 01/23/2012 12:21 PM

## 2012-01-23 NOTE — Discharge Summary (Signed)
Entered in Error

## 2012-01-24 LAB — GLUCOSE, CAPILLARY
Glucose-Capillary: 208 mg/dL — ABNORMAL HIGH (ref 70–99)
Glucose-Capillary: 241 mg/dL — ABNORMAL HIGH (ref 70–99)

## 2012-01-24 LAB — BASIC METABOLIC PANEL
BUN: 12 mg/dL (ref 6–23)
Calcium: 9.4 mg/dL (ref 8.4–10.5)
Chloride: 88 mEq/L — ABNORMAL LOW (ref 96–112)
Creatinine, Ser: 0.47 mg/dL — ABNORMAL LOW (ref 0.50–1.10)
GFR calc Af Amer: >90 mL/min (ref >90)
GFR calc non Af Amer: >90 mL/min (ref >90)

## 2012-01-24 MED ORDER — ALPRAZOLAM 0.5 MG PO TABS
0.5000 mg | ORAL_TABLET | Freq: Four times a day (QID) | ORAL | Status: DC
  Administered 2012-01-24: 0.5 mg via ORAL

## 2012-01-24 MED ORDER — QUETIAPINE FUMARATE ER 50 MG PO TB24: 50.0000 mg | ORAL_TABLET | Freq: Every day | ORAL | Status: DC

## 2012-01-24 MED ORDER — ALPRAZOLAM 0.5 MG PO TABS
1.0000 mg | ORAL_TABLET | Freq: Three times a day (TID) | ORAL | Status: DC
  Administered 2012-01-24 (×2): 1 mg via ORAL
  Filled 2012-01-24: qty 2
  Filled 2012-01-24 (×2): qty 1

## 2012-01-24 MED ORDER — CHLORPROMAZINE HCL 25 MG PO TABS: 25.0000 mg | ORAL_TABLET | Freq: Three times a day (TID) | ORAL | Status: DC

## 2012-01-24 MED ORDER — CLONIDINE HCL 0.2 MG/24HR TD PTWK
0.2000 mg | MEDICATED_PATCH | TRANSDERMAL | Status: DC
  Administered 2012-01-24 – 2012-01-31 (×2): 0.2 mg via TRANSDERMAL
  Filled 2012-01-24 (×2): qty 1

## 2012-01-24 MED ORDER — HYDROXYZINE HCL 50 MG PO TABS
50.0000 mg | ORAL_TABLET | Freq: Three times a day (TID) | ORAL | Status: DC | PRN
  Administered 2012-01-26 – 2012-01-31 (×15): 50 mg via ORAL
  Filled 2012-01-24 (×18): qty 1

## 2012-01-24 MED ORDER — BUSPIRONE HCL 10 MG PO TABS
10.0000 mg | ORAL_TABLET | Freq: Two times a day (BID) | ORAL | Status: DC
  Administered 2012-01-24 – 2012-01-31 (×14): 10 mg via ORAL
  Filled 2012-01-24 (×15): qty 1

## 2012-01-24 MED ORDER — CHLORPROMAZINE HCL 10 MG PO TABS
10.0000 mg | ORAL_TABLET | Freq: Three times a day (TID) | ORAL | Status: DC
  Filled 2012-01-24 (×3): qty 1

## 2012-01-24 MED ORDER — QUETIAPINE FUMARATE ER 50 MG PO TB24
150.0000 mg | ORAL_TABLET | Freq: Every day | ORAL | Status: DC
  Filled 2012-01-24: qty 3

## 2012-01-24 MED ORDER — CHLORPROMAZINE HCL 25 MG PO TABS
25.0000 mg | ORAL_TABLET | Freq: Three times a day (TID) | ORAL | Status: DC
  Administered 2012-01-24 – 2012-01-31 (×22): 25 mg via ORAL
  Filled 2012-01-24 (×23): qty 1

## 2012-01-24 MED ORDER — FLUTICASONE PROPIONATE 50 MCG/ACT NA SUSP
1.0000 | Freq: Every day | NASAL | Status: DC
  Administered 2012-01-24 – 2012-01-31 (×8): 1 via NASAL
  Filled 2012-01-24 (×2): qty 16

## 2012-01-24 NOTE — Progress Notes (Signed)
3:09 PM 01/24/2012 Edd Arbour MD  Spoke with Dr. Dan Humphreys on the phone concerning medication management for patient.  Advised  - wean off benzos with seizure coverage - patient on neurontin - dropping xanax to 1mg  TID, tomorrow 1mg  BID - DC all opioids - and add clonidine patch for withdraw al symptoms over the next three days. - add thorazine 25 mg TID, risk of TD negligible.  - start Buspar/vistaril prn -will continue trazodone at night for sleep. - discussed plan with Dr. Joline Maxcy 3:12 PM

## 2012-01-24 NOTE — Progress Notes (Signed)
Secretary called RT to go into room because patient says Oxygen is not working.  RT has removed oxygen and readjusted several times for patient and assured that it is working properly.  O2 sats are good.  Patient is still not satisfied that O2 is working correctly.  RN notified

## 2012-01-24 NOTE — Progress Notes (Signed)
Clinical Child psychotherapist (CSW) spoke with pt sister [name] and pt daughter [name] who stated they would like pt to dc to either [facility] or [facility]. CSW informed them that pt maybe limited to offers as pt needing long term care and pt psych history. Family understandable. CSW will follow up with offers.  Theresia Bough, MSW, Theresia Majors [phone]

## 2012-01-24 NOTE — Progress Notes (Signed)
FMTS Attending Note  Patient seen and examined by me, discussed with resident team. The patient is very awake and alert, voices several concerns and appears quite anxious.  Among her greatest concerns are poor sleep, and nasal dryness associated with nasal canula oxygen.   Patient takes several sedative medications at admission; concerns about oversedation.  Appreciate psychiatry plan as outlined in note.  Among the issues we need to clarify are whether psychiatry will be managing her pych medications, including Thorazine, after discharge.  If note, then I would favor scaling back on the medications she was taking at admission and adjusting dosages to avoid oversedation.  Slow taper for BNZ that can be established before discharge and carried out as an outpatient.   Paula Compton, M.D.

## 2012-01-24 NOTE — Consult Note (Signed)
Follow-up to conversation with resident on the risk of Tardive Dyskinesia risk for Thorazine. The risk of TD with Thorazine is more similar to that of Prozac than to the TD risk with the other typical or atypical antipsychotics. Thank you. Dan Humphreys, Mercadez Heitman 01/24/2012 2:35 PM

## 2012-01-24 NOTE — Progress Notes (Signed)
Family Medicine Teaching Service [facility] Progress Note  Patient name: [name] Medical record number: [id number] Date of birth: October 30, 1941 Age: 71 y.o. Gender: female    LOS: 4 days   Primary Care Provider: Ellery Plunk, MD, MD  Overnight Events: NAEO. Did not sleep well. Continues to complain of some LE pain, and wanting her trazadone for sleep.    Objective: Vital signs in last 24 hours: Temp:  [97.4 F (36.3 C)-99.7 F (37.6 C)] 97.4 F (36.3 C) (02/04 0429) Pulse Rate:  [103-116] 109  (02/04 0429) Resp:  [20] 20  (02/04 0429) BP: (117-135)/(65-73) 135/72 mmHg (02/04 0429) SpO2:  [92 %-99 %] 97 % (02/04 0745) Weight:  [169 lb 8 oz (76.885 kg)] 169 lb 8 oz (76.885 kg) (02/04 0429)  Wt Readings from Last 3 Encounters:  01/24/12 169 lb 8 oz (76.885 kg)  11/15/11 180 lb (81.647 kg)  10/27/11 177 lb (80.287 kg)     Current Facility-Administered Medications  Medication Dose Route Frequency Provider Last Rate Last Dose  . ALPRAZolam Prudy Feeler) tablet 0.5 mg  0.5 mg Oral QID Shelly Flatten, MD      . Chlorhexidine Gluconate Cloth 2 % PADS 6 each  6 each Topical Q0600 Leighton Roach McDiarmid, MD   6 each at 01/23/12 2131  . chlorproMAZINE (THORAZINE) tablet 10 mg  10 mg Oral TID Shelly Flatten, MD      . chlorproMAZINE (THORAZINE) tablet 25 mg  25 mg Oral TID Shelly Flatten, MD      . divalproex (DEPAKOTE ER) 24 hr tablet 750 mg  750 mg Oral BID Dessa Phi, MD   750 mg at 01/23/12 2130  . enoxaparin (LOVENOX) injection 40 mg  40 mg Subcutaneous Q24H Josalyn Funches, MD   40 mg at 01/23/12 2131  . Fluticasone-Salmeterol (ADVAIR) 500-50 MCG/DOSE inhaler 1 puff  1 puff Inhalation BID Dessa Phi, MD   1 puff at 01/24/12 0743  . gabapentin (NEURONTIN) capsule 900 mg  900 mg Oral TID Leighton Roach McDiarmid, MD   900 mg at 01/23/12 2129  . haloperidol (HALDOL) tablet 0.5 mg  0.5 mg Oral QHS PRN Barnabas Lister, MD   0.5 mg at 01/24/12 0243  . insulin aspart (novoLOG) injection 0-15 Units   0-15 Units Subcutaneous TID WC Ivy Tye Savoy, MD   3 Units at 01/24/12 0615  . insulin aspart (novoLOG) injection 0-5 Units  0-5 Units Subcutaneous QHS Barnabas Lister, MD   2 Units at 01/22/12 2116  . mupirocin ointment (BACTROBAN) 2 % 1 application  1 application Nasal BID Leighton Roach McDiarmid, MD   1 application at 01/23/12 2140  . mupirocin ointment (BACTROBAN) 2 %   Nasal BID Josalyn Funches, MD      . mupirocin ointment (BACTROBAN) 2 %   Topical PRN Marena Chancy, MD      . nitroGLYCERIN (NITROGLYN) 2 % ointment 1 inch  1 inch Topical Once [place] Bailiff, MD      . oxyCODONE (Oxy IR/ROXICODONE) immediate release tablet 5 mg  5 mg Oral Q6H PRN Leighton Roach McDiarmid, MD   5 mg at 01/24/12 0932  . oxyCODONE (OXYCONTIN) 12 hr tablet 20 mg  20 mg Oral Q12H Josalyn Funches, MD   20 mg at 01/23/12 2129  . pantoprazole (PROTONIX) EC tablet 40 mg  40 mg Oral Q1200 Josalyn Funches, MD   40 mg at 01/23/12 1151  . polyethylene glycol (MIRALAX / GLYCOLAX) packet 17 g  17 g Oral  Daily Dessa Phi, MD   17 g at 01/22/12 1014  . QUEtiapine (SEROQUEL XR) 24 hr tablet 150 mg  150 mg Oral QHS Shelly Flatten, MD      . QUEtiapine (SEROQUEL XR) 24 hr tablet 50 mg  50 mg Oral QHS Shelly Flatten, MD      . tiotropium [facility]) inhalation capsule 18 mcg  18 mcg Inhalation Daily Dessa Phi, MD   18 mcg at 01/24/12 0743  . torsemide (DEMADEX) tablet 20 mg  20 mg Oral BID Marena Chancy, MD   20 mg at 01/24/12 0817  . traZODone (DESYREL) tablet 150 mg  150 mg Oral QHS Marena Chancy, MD   150 mg at 01/23/12 2236  . DISCONTD: ALPRAZolam Prudy Feeler) tablet 1 mg  1 mg Oral QID Dessa Phi, MD   1 mg at 01/23/12 2130  . DISCONTD: QUEtiapine (SEROQUEL XR) 24 hr tablet 300 mg  300 mg Oral QHS Josalyn Funches, MD   300 mg at 01/23/12 2128  . DISCONTD: torsemide (DEMADEX) tablet 80 mg  80 mg Oral BID Shelly Flatten, MD   80 mg at 01/23/12 1610     PE: Gen:  HEENT: MMM no cervical lymphadenopathy CV:  RRR RUE:AVWU JWJ:XBJY Ext/Musc: 1+ LE edema Neuro: CN grossly intact  Labs/Studies:  CBG (last 3)   Basename 01/24/12 0607 01/23/12 2125 01/23/12 1643  GLUCAP 191* 142* 252*        Assessment/Plan: [name] is a 71 y.o. F w/ continued bilateral LE edema.   1. LE edema: Likely multifactorial from worsening chronic venous insufficiency and mild diastolic congestive heart failure. Dopplers negative for DVT. Was on torsemide 80mg  bid, with an increase in bicarb. Changed to torsemide 20mg  bid (home dose is torsemide 20mg  in morning and 10mg  at night). Net down 6.5L since admission - Continue torsemide - continue KDur  - continue strict ins and outs and daily weights. Maintain Foley catheter to accurately quantify urine output.  - PT/OT  - BMET tomorrow - ambulate pt.   2. CV: Previous h/o diastolic HF. Possibly contributing to LE edema. troponins negative x3 BNP 516 on admission.  - Echo 60-75%  - torsemide 20mg  po bid   3. Polypharmacy: Patient taking multiple medications for pain anxiety, bipolar depression, insomnia and chronic pain. Continue to report being anxious and needing pain medications.  - psychiatry was consulted and recommended weaning her off xanax and stopping oxycodone. Recommended starting Thorazine and taper seroquel.  - Thorazine 10mg  TId today then 25mg  TID tomorrow - taper to xanax 0.5mg  today QID, +/- DC tomorrow as Thorazine increases - Taper Seroquel to 150 mg today then 50mg  tomorrow then DC.  - continue w/ trazadone 150mg   - monitor sedation  - will further review condition w/ psych  4. COPD: Doing well on 2L [place]. Home O2 of 2-3L  - Continue O2 as needed.  - continue spiriva   4. R hand wound: Wound dressing CDI.  - Continue current wound care BID per wound care team   5. FEN/GI: Tolerating carb modified diet.  - Contiue carb modified diet  - Daily CBGs.  - metformin dc'd SSI started   6. Prophylaxis: Lovenox 40 mg [place] q daily.   7.  Disposition: SNF pending work-up and placement by SW (family declining return to [facility]).         Signed: Shelly Flatten, MD Family Medicine Resident PGY-1 01/24/2012 10:09 AM

## 2012-01-24 NOTE — Progress Notes (Signed)
I have seen and examined this patient. I have discussed with Dr Losq.  I agree with their findings and plans as documented in their progress note for today.  

## 2012-01-25 ENCOUNTER — Telehealth: Payer: Self-pay | Admitting: Family Medicine

## 2012-01-25 LAB — GLUCOSE, CAPILLARY: Glucose-Capillary: 173 mg/dL — ABNORMAL HIGH (ref 70–99)

## 2012-01-25 MED ORDER — POTASSIUM CHLORIDE CRYS ER 20 MEQ PO TBCR
40.0000 meq | EXTENDED_RELEASE_TABLET | Freq: Once | ORAL | Status: AC
  Administered 2012-01-25: 40 meq via ORAL
  Filled 2012-01-25: qty 2

## 2012-01-25 MED ORDER — ALPRAZOLAM 0.5 MG PO TABS
0.5000 mg | ORAL_TABLET | Freq: Three times a day (TID) | ORAL | Status: DC
  Administered 2012-01-25 (×3): 0.5 mg via ORAL
  Filled 2012-01-25 (×3): qty 1

## 2012-01-25 MED ORDER — POTASSIUM CHLORIDE CRYS ER 20 MEQ PO TBCR
20.0000 meq | EXTENDED_RELEASE_TABLET | Freq: Two times a day (BID) | ORAL | Status: DC
  Administered 2012-01-25 – 2012-01-31 (×12): 20 meq via ORAL
  Filled 2012-01-25 (×14): qty 1

## 2012-01-25 MED ORDER — ACETAMINOPHEN 325 MG PO TABS
325.0000 mg | ORAL_TABLET | ORAL | Status: DC | PRN
  Administered 2012-01-25 – 2012-01-26 (×2): 325 mg via ORAL
  Filled 2012-01-25 (×2): qty 2

## 2012-01-25 MED ORDER — IBUPROFEN 400 MG PO TABS
400.0000 mg | ORAL_TABLET | Freq: Four times a day (QID) | ORAL | Status: DC | PRN
  Administered 2012-01-25 (×2): 400 mg via ORAL
  Filled 2012-01-25 (×2): qty 1

## 2012-01-25 NOTE — Telephone Encounter (Signed)
Needs to speak with MD asap (before lunch time today), pt went into the hospital for leg swelling and now the hospital docs are trying to take her off her meds that she has been on for several years, discontinued pts oxycontin, also told pt they were discontinuing her xanax in 2 days, daughter says she didn't go to the hospital for detox, she went for a medical condition. Says pt has not abused the meds and takes them like shes suppose to and wants to know why they are trying to take her off.

## 2012-01-25 NOTE — Progress Notes (Signed)
Alert and oriented x 3 and restless at time. Calls for assist per call bell and tends to yell from her bed. Pt advised the nurse that she is upset and concerned about her nerve medication reduction. Nurse to follow pt progress.

## 2012-01-25 NOTE — Progress Notes (Signed)
Nurse spoke with daughter concerning pt condition. After conversing with MD the daughter cont to express concerns. Nursing to follow pt progress.

## 2012-01-25 NOTE — Progress Notes (Signed)
Family Medicine Teaching Service [facility] Progress Note  Patient name: [name] Medical record number: [id number] Date of birth: December 05, 1941 Age: 71 y.o. Gender: female    LOS: 5 days   Primary Care Provider: Ellery Plunk, MD, MD  Overnight Events: NAEO. Pt reports sleeping 9 hours w/o awakening. Feels much better today. Pt enjoyed ambulating yesterday. LE pain improved today.  Denies CP, SOB, n/v/d, syncope, HA, lightheadedness, Abd pain, tremors   Objective: Vital signs in last 24 hours: Temp:  [97.5 F (36.4 C)-98.4 F (36.9 C)] 97.5 F (36.4 C) (02/05 0650) Pulse Rate:  [88-125] 88  (02/05 0650) Resp:  [20-24] 20  (02/05 0650) BP: (104-133)/(53-82) 122/82 mmHg (02/05 0650) SpO2:  [95 %-100 %] 95 % (02/05 0650) Weight:  [170 lb 10.2 oz (77.4 kg)] 170 lb 10.2 oz (77.4 kg) (02/05 0337)  Wt Readings from Last 3 Encounters:  01/25/12 170 lb 10.2 oz (77.4 kg)  11/15/11 180 lb (81.647 kg)  10/27/11 177 lb (80.287 kg)     Current Facility-Administered Medications  Medication Dose Route Frequency Provider Last Rate Last Dose  . ALPRAZolam Prudy Feeler) tablet 1 mg  1 mg Oral TID Kristen Arbour, MD   1 mg at 01/24/12 2135  . busPIRone (BUSPAR) tablet 10 mg  10 mg Oral BID Kristen Arbour, MD   10 mg at 01/24/12 1534  . Chlorhexidine Gluconate Cloth 2 % PADS 6 each  6 each Topical Q0600 Kristen Roach McDiarmid, MD   6 each at 01/24/12 2136  . chlorproMAZINE (THORAZINE) tablet 25 mg  25 mg Oral TID Kristen Arbour, MD   25 mg at 01/24/12 2134  . cloNIDine (CATAPRES - Dosed in mg/24 hr) patch 0.2 mg  0.2 mg Transdermal Weekly Kristen Arbour, MD   0.2 mg at 01/24/12 1532  . divalproex (DEPAKOTE ER) 24 hr tablet 750 mg  750 mg Oral BID Kristen Phi, MD   750 mg at 01/24/12 2135  . enoxaparin (LOVENOX) injection 40 mg  40 mg Subcutaneous Q24H Kristen Funches, MD   40 mg at 01/24/12 2135  . fluticasone (FLONASE) 50 MCG/ACT nasal spray 1 spray  1 spray Each Nare Daily Kristen Arbour, MD   1 spray  at 01/24/12 1623  . Fluticasone-Salmeterol (ADVAIR) 500-50 MCG/DOSE inhaler 1 puff  1 puff Inhalation BID Kristen Phi, MD   1 puff at 01/24/12 2121  . gabapentin (NEURONTIN) capsule 900 mg  900 mg Oral TID Kristen Roach McDiarmid, MD   900 mg at 01/24/12 2135  . hydrOXYzine (ATARAX/VISTARIL) tablet 50 mg  50 mg Oral TID PRN Kristen Arbour, MD      . insulin aspart (novoLOG) injection 0-15 Units  0-15 Units Subcutaneous TID WC Kristen Tye Savoy, MD   3 Units at 01/25/12 [phone]  . insulin aspart (novoLOG) injection 0-5 Units  0-5 Units Subcutaneous QHS Kristen Lister, MD   2 Units at 01/24/12 2137  . mupirocin ointment (BACTROBAN) 2 % 1 application  1 application Nasal BID Kristen Roach McDiarmid, MD   1 application at 01/24/12 2136  . mupirocin ointment (BACTROBAN) 2 %   Nasal BID Kristen Funches, MD      . mupirocin ointment (BACTROBAN) 2 %   Topical PRN Kristen Chancy, MD      . nitroGLYCERIN (NITROGLYN) 2 % ointment 1 inch  1 inch Topical Once Kristen Bailiff, MD      . pantoprazole (PROTONIX) EC tablet 40 mg  40 mg Oral Q1200 Kristen Phi, MD   40  mg at 01/24/12 1238  . polyethylene glycol (MIRALAX / GLYCOLAX) packet 17 g  17 g Oral Daily Kristen Funches, MD   17 g at 01/24/12 1012  . tiotropium (SPIRIVA) inhalation capsule 18 mcg  18 mcg Inhalation Daily Kristen Phi, MD   18 mcg at 01/24/12 0743  . torsemide (DEMADEX) tablet 20 mg  20 mg Oral BID Kristen Chancy, MD   20 mg at 01/25/12 0825  . traZODone (DESYREL) tablet 150 mg  150 mg Oral QHS Kristen Chancy, MD   150 mg at 01/24/12 2135  . DISCONTD: ALPRAZolam Prudy Feeler) tablet 0.5 mg  0.5 mg Oral QID Kristen Flatten, MD   0.5 mg at 01/24/12 1012  . DISCONTD: ALPRAZolam Prudy Feeler) tablet 1 mg  1 mg Oral QID Kristen Phi, MD   1 mg at 01/23/12 2130  . DISCONTD: chlorproMAZINE (THORAZINE) tablet 10 mg  10 mg Oral TID Kristen Flatten, MD      . DISCONTD: chlorproMAZINE (THORAZINE) tablet 25 mg  25 mg Oral TID Kristen Flatten, MD      . DISCONTD: haloperidol (HALDOL)  tablet 0.5 mg  0.5 mg Oral QHS PRN Kristen Lister, MD   0.5 mg at 01/24/12 0243  . DISCONTD: oxyCODONE (Oxy IR/ROXICODONE) immediate release tablet 5 mg  5 mg Oral Q6H PRN Kristen Roach McDiarmid, MD   5 mg at 01/24/12 4098  . DISCONTD: oxyCODONE (OXYCONTIN) 12 hr tablet 20 mg  20 mg Oral Q12H Kristen Funches, MD   20 mg at 01/24/12 1012  . DISCONTD: QUEtiapine (SEROQUEL XR) 24 hr tablet 150 mg  150 mg Oral QHS Kristen Flatten, MD      . DISCONTD: QUEtiapine (SEROQUEL XR) 24 hr tablet 300 mg  300 mg Oral QHS Kristen Funches, MD   300 mg at 01/23/12 2128  . DISCONTD: QUEtiapine (SEROQUEL XR) 24 hr tablet 50 mg  50 mg Oral QHS Kristen Flatten, MD         PE: Gen: NAD HEENT: MMM CV: RRR  [id number]  Ext/Musc: compression stockings in place. Non-painful to palpation. Neuro: CN grossly intact   Labs/Studies:  CBG (last 3)   Basename 01/25/12 0628 01/24/12 2129 01/24/12 1557  GLUCAP 173* 241* 208*   Basic Metabolic Panel:    Component Value Date/Time   NA 132* 01/24/2012 1007   K 3.4* 01/24/2012 1007   CL 88* 01/24/2012 1007   CO2 34* 01/24/2012 1007   BUN 12 01/24/2012 1007   CREATININE 0.47* 01/24/2012 1007   CREATININE 0.73 03/24/2011 1614   GLUCOSE 233* 01/24/2012 1007   CALCIUM 9.4 01/24/2012 1007     Assessment/Plan: [name] is a 71 y.o. F w/ continued bilateral LE edema.   1. LE edema: Likely multifactorial from worsening chronic venous insufficiency and mild diastolic congestive heart failure. Dopplers negative for DVT. Was on torsemide 80mg  bid, with an increase in bicarb. Changed to torsemide 20mg  bid (home dose is torsemide 20mg  in morning and 10mg  at night). Net down 7.4L since admission  - Continue home torsemide dose   - continue strict ins and outs and daily weights. Maintain Foley catheter to accurately quantify urine output.  - PT/OT  - BMET tomorrow  - ambulate pt.   2. CV: Previous h/o diastolic HF. Possibly contributing to LE edema. troponins negative x3 BNP 516 on admission.    - Echo 60-75%  - torsemide 20mg  po bid   3. Hyponatremia/Hypokelemic: Pts home Kdur not prescribed. On significant diuresis plan since admission.  Hyponatremia and hypokalemia will likely improve now that diuresis has been tapered to home dose. Home dose Kdur of Qam and Kdur QHS  - K-dur x1 then K-dur BID - BMET in am  3. Polypharmacy: Patient taking multiple medications for pain anxiety, bipolar depression, insomnia and chronic pain. Psych is managing and wants pt to undergo withdrawal here in hospital.  All opiods and Seroquel were DCd yesterday afternoon.Buspar was started yesterday. On depakote and neurontin still, this will provide some seizure coverage.  - Thorazine 125mg  TID - xanax decreased to 0.5mg  today TID -  Continue Buspar 10mg  BID - continue w/ trazadone 150mg   - monitor sedation  - will further review condition w/ psych  -   4. COPD: Doing well on 2L [place]. Home O2 of 2-3L  - Continue O2 as needed.  - continue spiriva   4. R hand wound: Wound dressing CDI.  - Continue current wound care BID per wound care team   5. FEN/GI: Tolerating carb modified diet.  - Contiue carb modified diet  - Daily CBGs.  - metformin dc'd SSI started   6. Prophylaxis: Lovenox 40 mg [place] q daily.   7. Disposition: SNF pending work-up and placement by SW (family declining return to [facility]).     Signed: Shelly Flatten, MD Family Medicine Resident PGY-1 01/25/2012 8:28 AM

## 2012-01-25 NOTE — Progress Notes (Signed)
Clinical Child psychotherapist (CSW) left a message for pt daughter Gavin Pound regarding bed offers: [facility]. CSW informed both pt sister Velna Hatchet and daughter [name] of updates. CSW will try to call pt other daughter today and CSW will continue to follow. Theresia Bough, MSW, Theresia Majors [phone]

## 2012-01-25 NOTE — Telephone Encounter (Signed)
Talked to [name], patient's daughter, about her concerns. It seemed like she needed to vent. I told her that while she was in the hospital, I would defer to the hospital team's care. I told her that I had spoken with the attending on service about her care and it did not sound like to be discharged in the next day or so. Once she was discharged, in her followup appointment, we can negotiate her medications.

## 2012-01-25 NOTE — Progress Notes (Signed)
Physical Therapy Treatment Patient Details Name: [name] MRN: [id number] DOB: July 14, 1941 Today's Date: 01/25/2012  PT Assessment/Plan  PT - Assessment/Plan Comments on Treatment Session: pt having trouble as usual staying focused on task.  She is consumed with her pain and other things making her uncomfortable.  Clearly she is lonely which contributes to her misery PT Plan: Discharge plan remains appropriate Follow Up Recommendations: Skilled nursing facility Equipment Recommended: Defer to next venue PT Goals  Acute Rehab PT Goals PT Goal Formulation: With patient PT Goal: Sit to Stand - Progress: Progressing toward goal PT Transfer Goal: Bed to Chair/Chair to Bed - Progress: Progressing toward goal PT Goal: Ambulate - Progress: Progressing toward goal  PT Treatment Precautions/Restrictions  Precautions Precautions: Fall Required Braces or Orthoses: No Restrictions Weight Bearing Restrictions: No Mobility (including Balance) Transfers Transfers: Yes Sit to Stand: 4: Min assist Sit to Stand Details (indicate cue type and reason): vc's for hand placement, redirecting to task Stand to Sit: 4: Min assist;To chair/3-in-1 Stand to Sit Details: vc for hand placement and technique to control descent/ min assist to control descent Ambulation/Gait Ambulation/Gait: Yes Ambulation/Gait Assistance Details (indicate cue type and reason): vc/tc's for improved use of RW and postural checks; manual A to help maneuver RW Ambulation Distance (Feet): 90 Feet (then 30 after sitting rest break) Assistive device: Rolling walker Gait Pattern: Step-through pattern;Step-to pattern;Decreased step length - right;Decreased step length - left;Decreased stance time - right;Decreased stance time - left;Antalgic (irregular antalgic gait, painful more on R foot )  Posture/Postural Control Posture/Postural Control: No significant limitations Balance Balance Assessed: No Exercise    End of Session PT  - End of Session Activity Tolerance: Patient tolerated treatment well;Patient limited by pain Patient left: in chair;with call bell in reach;with family/visitor present Nurse Communication: Mobility status for transfers;Mobility status for ambulation General Behavior During Session: Restless Cognition: Impaired  Jayen Bromwell, Eliseo Gum 01/25/2012, 12:14 PM  01/25/2012  [place] Bing, PT [phone] (pager)

## 2012-01-25 NOTE — Progress Notes (Signed)
Clinical Child psychotherapist (CSW) visited pt room to discuss disposition. Pt stated she does not want to go to a nursing facility but prefers to go home with her daughter [name] when medically ready. CSW asked pt where she will go if daughter [name] unable to care for pt. Pt stated she did not understand why her daughter would not allow pt to go home with her. CSW will contact pt daughter to discuss pt desire to go home with PT.   Theresia Bough, MSW, Theresia Majors [phone]

## 2012-01-25 NOTE — Plan of Care (Signed)
Problem: Phase I Progression Outcomes Goal: EF % per last Echo/documented,Core Reminder form on chart Outcome: Completed/Met Date Met:  01/25/12 EF = 65-70% per ECHO performed on 01/21/12

## 2012-01-25 NOTE — Progress Notes (Signed)
FMTS Attending Note  Patient seen and examined by me today; she continues with mood lability, becomes tearful when discussing leg pain in R ankle, to upbeat when talking about her restful sleep last night.  Compression stockings in place.   Agree with Dr. Satira Sark assessment and plan as documented above; plan to continue changing her medication regimen for anxiety and mood stabilizer as per psychiatry recommendations.  Has diureses significantly; edema thought to be from venous insufficiency, without demonstrable systolic heart failure on ECHO.  Paula Compton, M.D.

## 2012-01-26 LAB — BASIC METABOLIC PANEL
BUN: 12 mg/dL (ref 6–23)
Chloride: 90 mEq/L — ABNORMAL LOW (ref 96–112)
Creatinine, Ser: 0.43 mg/dL — ABNORMAL LOW (ref 0.50–1.10)
GFR calc Af Amer: >90 mL/min (ref >90)
GFR calc non Af Amer: >90 mL/min (ref >90)
Glucose, Bld: 223 mg/dL — ABNORMAL HIGH (ref 70–99)
Potassium: 3.9 mEq/L (ref 3.5–5.1)

## 2012-01-26 LAB — GLUCOSE, CAPILLARY
Glucose-Capillary: 220 mg/dL — ABNORMAL HIGH (ref 70–99)
Glucose-Capillary: 225 mg/dL — ABNORMAL HIGH (ref 70–99)

## 2012-01-26 MED ORDER — ALPRAZOLAM 0.5 MG PO TABS
0.5000 mg | ORAL_TABLET | Freq: Two times a day (BID) | ORAL | Status: DC
  Administered 2012-01-26 (×2): 0.5 mg via ORAL
  Filled 2012-01-26 (×2): qty 1

## 2012-01-26 MED ORDER — METFORMIN HCL 500 MG PO TABS
1000.0000 mg | ORAL_TABLET | Freq: Two times a day (BID) | ORAL | Status: DC
  Administered 2012-01-27 – 2012-01-31 (×9): 1000 mg via ORAL
  Filled 2012-01-26 (×11): qty 2

## 2012-01-26 MED ORDER — METFORMIN HCL 500 MG PO TABS
1000.0000 mg | ORAL_TABLET | Freq: Two times a day (BID) | ORAL | Status: DC
  Filled 2012-01-26 (×2): qty 2

## 2012-01-26 MED ORDER — PNEUMOCOCCAL VAC POLYVALENT 25 MCG/0.5ML IJ INJ
0.5000 mL | INJECTION | INTRAMUSCULAR | Status: AC
  Administered 2012-01-27: 0.5 mL via INTRAMUSCULAR
  Filled 2012-01-26: qty 0.5

## 2012-01-26 MED ORDER — IBUPROFEN 400 MG PO TABS
400.0000 mg | ORAL_TABLET | ORAL | Status: DC | PRN
  Administered 2012-01-26 – 2012-01-30 (×9): 400 mg via ORAL
  Filled 2012-01-26 (×12): qty 1

## 2012-01-26 MED ORDER — ACETAMINOPHEN 325 MG PO TABS
650.0000 mg | ORAL_TABLET | ORAL | Status: DC | PRN
  Administered 2012-01-27 – 2012-01-31 (×11): 650 mg via ORAL
  Filled 2012-01-26 (×10): qty 2

## 2012-01-26 NOTE — Progress Notes (Signed)
Clinical Child psychotherapist (CSW) spoke to pt daughter [name] who stated that she is unable to care for pt at home. CSW also spoke to pt daughter Gavin Pound who confirmed she is also unable to care for pt at home and therefore pt will need to dc to a facility. CSW will speak to pt about bed offers and encourage to make a decision today. CSW will follow.  Also, daughter [name] would like to be contacted between 11:30am-2pm today at work [phone]) to discuss pt medications. CSW will inform MD.

## 2012-01-26 NOTE — Progress Notes (Signed)
FMTS Attending Note  Patient seen and examined by me; less labile than previously.  Is concerned about the changes in her medication regimen.  Appears more appropriate in her questioning and thought process today.   WIll continue with med changes as per psychiatry recommendations, with likely discharge from inpatient hospital stay tomorrow or Friday (Feb 8th).  Paula Compton, M.D.

## 2012-01-26 NOTE — Progress Notes (Addendum)
01/26/12 @ 1400.  Horizontal skin tear to left posterior leg, app 2 cm - occurred when leg was pinched in BSC.  I was with patient when this occurred and  assisted her to bedside commode. Aris Georgia, NT stayed for completion of toileting and reported skin tear to me with small amount of bleeding from site.  He cleaned with soap and water.   Skin intact around tear.  Margaretmary Bayley, RN  02/06 13 @ 1500. I asissted pt to bedside commode during shift report and observed dried blood to skin tear, posterior leg.  Mepilex applied by Nino Glow, RN @ report hand-off from self to her.  Pt assisted to bedside commode @ present without pinching.  Margaretmary Bayley, RN

## 2012-01-26 NOTE — Progress Notes (Signed)
Family Medicine Teaching Service [facility] Progress Note  Patient name: [name] Medical record number: [id number] Date of birth: 07/22/1941 Age: 71 y.o. Gender: female    LOS: 6 days   Primary Care Provider: Ellery Plunk, MD, MD  Overnight Events: Pt reports not sleeping well last night and having multiple bouts of loose stool (nursing states pt had 2-3 formed BMs). Pt denies any CP, SOB, syncope, tremors, HA, lightheadedness. Continues to have some LE pain on ambulation (R>L). Taking good PO   Objective: Vital signs in last 24 hours: Temp:  [98 F (36.7 C)-98.5 F (36.9 C)] 98 F (36.7 C) (02/06 0340) Pulse Rate:  [103-122] 103  (02/06 0340) Resp:  [20-21] 20  (02/06 0340) BP: (118-133)/(68-88) 133/69 mmHg (02/06 0340) SpO2:  [95 %-98 %] 98 % (02/06 0340) Weight:  [172 lb 6.4 oz (78.2 kg)] 172 lb 6.4 oz (78.2 kg) (02/06 0340)  Wt Readings from Last 3 Encounters:  01/26/12 172 lb 6.4 oz (78.2 kg)  11/15/11 180 lb (81.647 kg)  10/27/11 177 lb (80.287 kg)     Current Facility-Administered Medications  Medication Dose Route Frequency Provider Last Rate Last Dose  . acetaminophen (TYLENOL) tablet 325 mg  325 mg Oral Q4H PRN Edd Arbour, MD   325 mg at 01/26/12 0420  . ALPRAZolam Prudy Feeler) tablet 0.5 mg  0.5 mg Oral TID Edd Arbour, MD   0.5 mg at 01/25/12 2154  . busPIRone (BUSPAR) tablet 10 mg  10 mg Oral BID Edd Arbour, MD   10 mg at 01/25/12 2127  . Chlorhexidine Gluconate Cloth 2 % PADS 6 each  6 each Topical Q0600 Leighton Roach McDiarmid, MD   6 each at 01/25/12 2155  . chlorproMAZINE (THORAZINE) tablet 25 mg  25 mg Oral TID Edd Arbour, MD   25 mg at 01/25/12 2126  . cloNIDine (CATAPRES - Dosed in mg/24 hr) patch 0.2 mg  0.2 mg Transdermal Weekly Edd Arbour, MD   0.2 mg at 01/24/12 1532  . divalproex (DEPAKOTE ER) 24 hr tablet 750 mg  750 mg Oral BID Dessa Phi, MD   750 mg at 01/25/12 2129  . enoxaparin (LOVENOX) injection 40 mg  40 mg Subcutaneous Q24H  Josalyn Funches, MD   40 mg at 01/25/12 2123  . fluticasone (FLONASE) 50 MCG/ACT nasal spray 1 spray  1 spray Each Nare Daily Edd Arbour, MD   1 spray at 01/25/12 1047  . Fluticasone-Salmeterol (ADVAIR) 500-50 MCG/DOSE inhaler 1 puff  1 puff Inhalation BID Dessa Phi, MD   1 puff at 01/25/12 2220  . gabapentin (NEURONTIN) capsule 900 mg  900 mg Oral TID Leighton Roach McDiarmid, MD   900 mg at 01/25/12 2125  . hydrOXYzine (ATARAX/VISTARIL) tablet 50 mg  50 mg Oral TID PRN Edd Arbour, MD      . ibuprofen (ADVIL,MOTRIN) tablet 400 mg  400 mg Oral Q6H PRN Edd Arbour, MD   400 mg at 01/25/12 2131  . insulin aspart (novoLOG) injection 0-15 Units  0-15 Units Subcutaneous TID WC Ivy Tye Savoy, MD   5 Units at 01/26/12 [phone]  . insulin aspart (novoLOG) injection 0-5 Units  0-5 Units Subcutaneous QHS Barnabas Lister, MD   3 Units at 01/25/12 2220  . mupirocin ointment (BACTROBAN) 2 % 1 application  1 application Nasal BID Leighton Roach McDiarmid, MD   1 application at 01/25/12 1047  . mupirocin ointment (BACTROBAN) 2 %   Nasal BID Dessa Phi, MD      .  mupirocin ointment (BACTROBAN) 2 %   Topical PRN Marena Chancy, MD      . nitroGLYCERIN (NITROGLYN) 2 % ointment 1 inch  1 inch Topical Once [place] Bailiff, MD      . pantoprazole (PROTONIX) EC tablet 40 mg  40 mg Oral Q1200 Dessa Phi, MD   40 mg at 01/25/12 1212  . polyethylene glycol (MIRALAX / GLYCOLAX) packet 17 g  17 g Oral Daily Josalyn Funches, MD   17 g at 01/25/12 1038  . potassium chloride SA (K-DUR,KLOR-CON) CR tablet 20 mEq  20 mEq Oral BID Shelly Flatten, MD   20 mEq at 01/25/12 2119  . potassium chloride SA (K-DUR,KLOR-CON) CR tablet 40 mEq  40 mEq Oral Once Shelly Flatten, MD   40 mEq at 01/25/12 1037  . tiotropium (SPIRIVA) inhalation capsule 18 mcg  18 mcg Inhalation Daily Dessa Phi, MD   18 mcg at 01/25/12 0846  . torsemide (DEMADEX) tablet 20 mg  20 mg Oral BID Marena Chancy, MD   20 mg at 01/25/12 1718  . traZODone (DESYREL)  tablet 150 mg  150 mg Oral QHS Marena Chancy, MD   150 mg at 01/25/12 2128  . DISCONTD: ALPRAZolam Prudy Feeler) tablet 1 mg  1 mg Oral TID Edd Arbour, MD   1 mg at 01/24/12 2135     PE: Gen: NAD  HEENT: MMM  CV: RRR, 2+ pulses in all extremities RUE:AVWU  Ext/Musc: 1+ LE edema. . Non-painful to palpation.  Neuro: CN grossly intact Psych: Pt alert and oriented x3. Mentation better than previously.    Labs/Studies:  Results for orders placed during the hospital encounter of 01/20/12 (from the past 24 hour(s))  GLUCOSE, CAPILLARY     Status: Abnormal   Collection Time   01/25/12 11:01 AM      Component Value Range   Glucose-Capillary 243 (*) 70 - 99 (mg/dL)   Comment 1 Documented in Chart     Comment 2 Notify RN    GLUCOSE, CAPILLARY     Status: Abnormal   Collection Time   01/25/12  4:01 PM      Component Value Range   Glucose-Capillary 254 (*) 70 - 99 (mg/dL)  GLUCOSE, CAPILLARY     Status: Abnormal   Collection Time   01/25/12  9:23 PM      Component Value Range   Glucose-Capillary 280 (*) 70 - 99 (mg/dL)  BASIC METABOLIC PANEL     Status: Abnormal   Collection Time   01/26/12  5:15 AM      Component Value Range   Sodium 131 (*) 135 - 145 (mEq/L)   Potassium 3.9  3.5 - 5.1 (mEq/L)   Chloride 90 (*) 96 - 112 (mEq/L)   CO2 29  19 - 32 (mEq/L)   Glucose, Bld 223 (*) 70 - 99 (mg/dL)   BUN 12  6 - 23 (mg/dL)   Creatinine, Ser 9.81 (*) 0.50 - 1.10 (mg/dL)   Calcium 9.8  8.4 - 19.1 (mg/dL)   GFR calc non Af Amer >90  >90 (mL/min)   GFR calc Af Amer >90  >90 (mL/min)  GLUCOSE, CAPILLARY     Status: Abnormal   Collection Time   01/26/12  6:38 AM      Component Value Range   Glucose-Capillary 246 (*) 70 - 99 (mg/dL)   Comment 1 Notify RN      CBG (last 3)   Basename 01/26/12 0638 01/25/12 2123 01/25/12 1601  GLUCAP 246* 280* 254*  Assessment/Plan: CHELCY BOLDA is a 71 y.o. F w/ continued bilateral LE edema and improving sedation  1. LE edema: Likely  multifactorial from worsening chronic venous insufficiency and mild diastolic congestive heart failure. Dopplers negative for DVT. Was on torsemide 80mg  bid, with an increase in bicarb. Changed to torsemide 20mg  bid (home dose is torsemide 20mg  in morning and 10mg  at night). Net down 7.4L since admission  - Continue home torsemide dose  - continue strict ins and outs and daily weights. Maintain Foley catheter to accurately quantify urine output.  - PT/OT  - BMET tomorrow  - ambulate pt.   2. CV: Previous h/o diastolic HF. Possibly contributing to LE edema. troponins negative x3 BNP 516 on admission.  - Echo 60-75%  - torsemide 20mg  po bid   3. Hyponatremia/: On significant diuresis plan since admission. Hyponatremia and will likely improve now that diuresis has been tapered to home dose.  - BMET in am   3. Polypharmacy: Patient taking multiple medications for pain anxiety, bipolar depression, insomnia and chronic pain. Psych is managing and wants pt to undergo withdrawal here in hospital. All opiods and Seroquel were DCd yesterday afternoon.Buspar was started yesterday. On depakote and neurontin still, this will provide some seizure coverage.  - Thorazine 25mg  TID  - xanax decreased to 0.5mg  BID today then Qday tomorrow and DC - Continue Buspar 10mg  BID  - continue w/ trazadone 150mg   - monitor sedation  - will further review condition w/ psych  -  4. COPD: Doing well on 2L [place]. Home O2 of 2-3L  - Continue O2 as needed.  - continue spiriva   4. R hand wound: Wound dressing CDI.  - Continue current wound care BID per wound care team   5. DM: CBG continues to be elevated - SSI - will add home metformin   5. FEN/GI: Tolerating carb modified diet. Hyponatremia as above.  - Contiue carb modified diet  - Daily CBGs.  - Kdur BID  - metformin dc'd SSI started   6. Prophylaxis: Lovenox 40 mg [place] q daily.   7. Disposition: SNF pending work-up and placement by SW (family declining  return to [facility]). Likely DC tomorrow   Signed: Shelly Flatten, MD Family Medicine Resident PGY-1 01/26/2012 7:50 AM

## 2012-01-26 NOTE — Progress Notes (Signed)
Inpatient Diabetes Program Recommendations  AACE/ADA: New Consensus Statement on Inpatient Glycemic Control (2009)  Target Ranges:  Prepandial:   less than 140 mg/dL      Peak postprandial:   less than 180 mg/dL (1-2 hours)      Critically ill patients:  140 - 180 mg/dL   Results for [name], [name] (MRN [id number]) as of 01/26/2012 14:52  Ref. Range 01/25/2012 11:01 01/25/2012 16:01 01/25/2012 21:23 01/26/2012 06:38 01/26/2012 11:44  Glucose-Capillary Latest Range: 70-99 mg/dL 409 (H) 811 (H) 914 (H) 246 (H) 220 (H)    Inpatient Diabetes Program Recommendations Insulin - Basal: Lantus 10 units

## 2012-01-26 NOTE — Progress Notes (Signed)
Physical Therapy Treatment Patient Details Name: [name] MRN: [id number] DOB: 1941-03-10 Today's Date: 01/26/2012  PT Assessment/Plan  PT - Assessment/Plan Comments on Treatment Session: mentation improved, but pt can still not focus on a task for long periods and can escalate if she gets focused on her pain or medicines PT Plan: Discharge plan remains appropriate Follow Up Recommendations: Skilled nursing facility Equipment Recommended: Defer to next venue PT Goals  Acute Rehab PT Goals PT Goal Formulation: With patient PT Goal: Sit to Stand - Progress: Progressing toward goal PT Transfer Goal: Bed to Chair/Chair to Bed - Progress: Progressing toward goal PT Goal: Ambulate - Progress: Progressing toward goal  PT Treatment Precautions/Restrictions  Precautions Precautions: Fall Required Braces or Orthoses: No Restrictions Weight Bearing Restrictions: No Mobility (including Balance) Bed Mobility Bed Mobility: No Transfers Transfers: Yes Sit to Stand: 4: Min assist Sit to Stand Details (indicate cue type and reason): vc's for hand placement; assist to come forward Ambulation/Gait Ambulation/Gait: Yes Ambulation/Gait Assistance: 4: Min assist Ambulation/Gait Assistance Details (indicate cue type and reason): vc's to get up closer to the RW; Ambulation Distance (Feet): 140 Feet Assistive device: Rolling walker Gait Pattern: Decreased step length - right;Decreased step length - left;Decreased stride length;Trunk flexed Stairs: No  Posture/Postural Control Posture/Postural Control: No significant limitations Exercise    End of Session PT - End of Session Activity Tolerance: Patient tolerated treatment well;Patient limited by pain Patient left: in chair;with call bell in reach;with family/visitor present Nurse Communication: Mobility status for transfers;Mobility status for ambulation General Behavior During Session: Restless Cognition: Impaired  Christophe Rising, Eliseo Gum 01/26/2012, 5:14 PM  01/26/2012  Le Claire Bing, PT [phone] (pager)

## 2012-01-26 NOTE — Clinical Documentation Improvement (Signed)
RESPIRATORY FAILURE DOCUMENTATION CLARIFICATION QUERY   THIS DOCUMENT IS NOT A PERMANENT PART OF THE MEDICAL RECORD  Please update your documentation within the medical record to reflect your response to this query.                                                                                     01/26/12  Dr. Konrad Dolores and/orAssociates,  In a better effort to capture your patient's severity of illness, reflect appropriate length of stay and utilization of resources, a review of the patient medical record has revealed the following indicators.    Based on your clinical judgment, please document in the progress notes and discharge summary if a condition below provides greater specificity regarding the patient's chronic respiratory status:   - Chronic Respiratory Failure   - Other Condition   - Unable to Clinically Determine  Supporting Clinical Information:  COPD: Doing well on 1.5L [place]. Home O2 of 2-3L  - Continue O2 as needed.  - continue Elsie Ra, MD  01/22/2012  10:48 AM  Medications prior to admission include:  - Advair 1 puff 2x daily  - Spiriva inhalation capsule daily  In responding to this query please exercise your independent judgment.  The fact that a query is asked, does not imply that any particular answer is desired or expected.   Reviewed: 02/07/12   Query never addressed - [email or web address].  Mathis Dad   Thank You,  Jerral Ralph  RN BSN Certified Clinical Documentation Specialist: Cell   [phone]  Health Information Management [place]  TO RESPOND TO THE THIS QUERY, FOLLOW THE INSTRUCTIONS BELOW:  1. If needed, update documentation for the patient's encounter via the notes activity.  2. Access this query again and click edit on the In [employer].  3. After updating, or not, click F2 to complete all highlighted (required) fields concerning your review. Select "additional documentation in the medical record" OR "no additional  documentation provided".  4. Click Sign note button.  5. The deficiency will fall out of your In Basket *Please let us know if you are not able to complete this workflow by phone or e-mail (listed below).

## 2012-01-26 NOTE — Progress Notes (Signed)
01/26/12  Patient ambulated in hallway with PT this afternoon and tolerated well. Margaretmary Bayley, RN

## 2012-01-27 LAB — BASIC METABOLIC PANEL
CO2: 17 mEq/L — ABNORMAL LOW (ref 19–32)
Calcium: 9.1 mg/dL (ref 8.4–10.5)
Creatinine, Ser: 0.38 mg/dL — ABNORMAL LOW (ref 0.50–1.10)
GFR calc non Af Amer: >90 mL/min (ref >90)
Glucose, Bld: 253 mg/dL — ABNORMAL HIGH (ref 70–99)
Sodium: 124 mEq/L — ABNORMAL LOW (ref 135–145)

## 2012-01-27 LAB — GLUCOSE, CAPILLARY

## 2012-01-27 MED ORDER — VENLAFAXINE HCL ER 150 MG PO CP24
300.0000 mg | ORAL_CAPSULE | Freq: Every day | ORAL | Status: DC
  Administered 2012-01-27 – 2012-01-31 (×4): 300 mg via ORAL
  Filled 2012-01-27 (×5): qty 2

## 2012-01-27 MED ORDER — ALBUTEROL SULFATE (5 MG/ML) 0.5% IN NEBU: 2.5000 mg | INHALATION_SOLUTION | RESPIRATORY_TRACT | Status: DC | PRN

## 2012-01-27 MED ORDER — ALPRAZOLAM 0.5 MG PO TABS
0.5000 mg | ORAL_TABLET | Freq: Every day | ORAL | Status: DC
  Administered 2012-01-27 – 2012-01-28 (×2): 0.5 mg via ORAL
  Filled 2012-01-27 (×2): qty 1

## 2012-01-27 NOTE — Progress Notes (Signed)
Discussed removing foley catheter with patient and patient's daughter.  Patient refused to have foley removed.  Stated she has "such a hard time at the nursing home getting to the bathroom."  Discussed the risk for infection with patient and daughter, no change in decision.  Foley catheter still remains in place at present. Idolina Primer RN

## 2012-01-27 NOTE — Progress Notes (Signed)
Chaplain's Note:  Pt requested visit from chaplain.  Pt was in distress.  Pt complains of being anxious.  Pt's daughter is not able to take pt back into her home and pt feels afraid of future.  Pt complains of pain in foot.  Provided spiritual and emotional support.  Please page if needed or requested. Dellie Catholic  [phone]  01/27/12 1613  Clinical Encounter Type  Visited With Patient  Visit Type Spiritual support;Psychological support  Referral From Nurse  Spiritual Encounters  Spiritual Needs Emotional;Prayer  Stress Factors  Patient Stress Factors Health changes;Lack of caregivers;Loss of control;Major life changes  Family Stress Factors Family relationships;Major life changes

## 2012-01-27 NOTE — Progress Notes (Signed)
FMTS Attending Note  Patient seen and examined by me today; she is awake and alert and asking questions about her medication changes.  Of note, she does have substantial resting tremor noted today, more noticeable than yesterday.  She reports feeling of anxiety to be worse today.  Plan to reinstate her Effexor, continue rapid wean off Xanax as per Psychiatry plan (today is her last dose); patient remains off opioids as well.  Physical therapy to continue to ambulate patient.  D/C foley catheter out of concern for development of UTI.  Plans for transfer to SNF tomorrow, 2/08. Paula Compton, MD

## 2012-01-27 NOTE — Progress Notes (Signed)
Nursing Note: Meds decreased due to oversedation the patient restless,calling on call bell frequently.Pt with numerous requests but unsatisfied though all requests completed.Yells out,don't leave me.support and assurance offered.wbb

## 2012-01-27 NOTE — Progress Notes (Signed)
Clinical Child psychotherapist (CSW) spoke with pt regarding her bed offers ([facility]). CSW informed the pt that CSW would need to have a decision today regarding which facility she would like to go to. The pt appeared anxious and upset as she stated she could not make a decision and did not feel she was ready to go. CSW informed that if MD determines she is stable and ready to go we would need a decision today for possible dc today or tomorrow. The pt contacted her daughter [name] while CSW was on the phone, CSW could here that daughter was encouraging mother to make a decision and consider going back to [facility] as they are familiar with her there, however stated it was her decision. CSW noticed pt was very upset and hung the phone. Pt stated she would like CSW to leave and return later. CSW informed pt that she would meet with the medical team and return between 12-12:30pm for a decision. CSW contacted pt sister Velna Hatchet who stated she does not want pt to go to [facility] but would contact pt now to encourage her to make a decision.  CSW received a call from Dr. Gwenlyn Saran informing that pt will not dc today however potential dc for tomorrow. CSW will still visit pt later today to obtain a decision regarding where she would like to dc to as CSW would like to have a plan in place for pt today.  Theresia Bough, MSW, Theresia Majors [phone]

## 2012-01-27 NOTE — Progress Notes (Signed)
Clinical Child psychotherapist (CSW) returned to speak to pt regarding bed offers however pt would not respond as she was asleep. CSW will return at a later time.

## 2012-01-27 NOTE — Progress Notes (Signed)
01/27/12 1530  CSW working to discharge pt. back to [facility], however, pt. does not want to return there and the family is encouraging her to go back.  May discharge tomorrow 2/8.  UR Completed. Tera Mater, RN, BSN

## 2012-01-27 NOTE — Progress Notes (Signed)
Clinical Child psychotherapist (CSW) returned to speak to pt to discuss placement however pt was at chair side asleep. CSW will return later to speak with pt. CSW also informed by MD that pt has designated a main contact person regarding pt care which is [name] (contact information on face sheet.)  Theresia Bough, MSW, Theresia Majors [phone]

## 2012-01-27 NOTE — Progress Notes (Signed)
Family Medicine Teaching Service [facility] Progress Note  Patient name: [name] Medical record number: [id number] Date of birth: [birth year] Age: 71 y.o. Gender: female    LOS: 7 days   Primary Care Provider: Ellery Plunk, MD, MD  Overnight Events:  NAEO. Slept 6 straight hours before being awoken for morning nursing check. Unable to go back to sleep after that time. Pt also complainaing of SOB that comes on when agitated or anxious. Increased "nerves" today which has contributed to her SOB. Not associated w/ ambulation. Resolution when pt is able to calm down. Also complaining of some LE pain on ambulation which is her baseline.   Pt still convinced she was on Trazadone 300mg  QHS prior to admission. Medical records reviewed from PCP. Most recent clinic visit on 12/27/11 lists Trazadone dose of 150mg  QHS  Denies nausea, vomiting, constipation, HA, CP, syncope, fever, rash, lightheadedness.   Objective: Vital signs in last 24 hours: Temp:  [97.3 F (36.3 C)-98 F (36.7 C)] 97.7 F (36.5 C) (02/07 0438) Pulse Rate:  [88-98] 98  (02/07 0438) Resp:  [18-22] 18  (02/07 0438) BP: (122-155)/(67-90) 125/90 mmHg (02/07 0438) SpO2:  [98 %-100 %] 99 % (02/07 0438) Weight:  [170 lb 13.7 oz (77.5 kg)] 170 lb 13.7 oz (77.5 kg) (02/07 0438)  Wt Readings from Last 3 Encounters:  01/27/12 170 lb 13.7 oz (77.5 kg)  11/15/11 180 lb (81.647 kg)  10/27/11 177 lb (80.287 kg)     Current Facility-Administered Medications  Medication Dose Route Frequency Provider Last Rate Last Dose  . acetaminophen (TYLENOL) tablet 650 mg  650 mg Oral Q4H PRN Shelly Flatten, MD   650 mg at 01/27/12 0556  . albuterol (PROVENTIL) (5 MG/ML) 0.5% nebulizer solution 2.5 mg  2.5 mg Nebulization Q4H PRN Leighton Roach McDiarmid, MD      . ALPRAZolam Prudy Feeler) tablet 0.5 mg  0.5 mg Oral BID Shelly Flatten, MD   0.5 mg at 01/26/12 2121  . busPIRone (BUSPAR) tablet 10 mg  10 mg Oral BID Edd Arbour, MD   10 mg at 01/26/12 2120  .  chlorproMAZINE (THORAZINE) tablet 25 mg  25 mg Oral TID Edd Arbour, MD   25 mg at 01/26/12 2116  . cloNIDine (CATAPRES - Dosed in mg/24 hr) patch 0.2 mg  0.2 mg Transdermal Weekly Edd Arbour, MD   0.2 mg at 01/24/12 1532  . divalproex (DEPAKOTE ER) 24 hr tablet 750 mg  750 mg Oral BID Dessa Phi, MD   750 mg at 01/26/12 2117  . enoxaparin (LOVENOX) injection 40 mg  40 mg Subcutaneous Q24H Josalyn Funches, MD   40 mg at 01/26/12 2124  . fluticasone (FLONASE) 50 MCG/ACT nasal spray 1 spray  1 spray Each Nare Daily Edd Arbour, MD   1 spray at 01/26/12 1044  . Fluticasone-Salmeterol (ADVAIR) 500-50 MCG/DOSE inhaler 1 puff  1 puff Inhalation BID Dessa Phi, MD   1 puff at 01/27/12 0801  . gabapentin (NEURONTIN) capsule 900 mg  900 mg Oral TID Leighton Roach McDiarmid, MD   900 mg at 01/26/12 2121  . hydrOXYzine (ATARAX/VISTARIL) tablet 50 mg  50 mg Oral TID PRN Edd Arbour, MD   50 mg at 01/27/12 0615  . ibuprofen (ADVIL,MOTRIN) tablet 400 mg  400 mg Oral Q4H PRN Shelly Flatten, MD   400 mg at 01/27/12 0615  . insulin aspart (novoLOG) injection 0-15 Units  0-15 Units Subcutaneous TID WC Ivy Tye Savoy, MD   5 Units at 01/27/12 [phone]  .  insulin aspart (novoLOG) injection 0-5 Units  0-5 Units Subcutaneous QHS Barnabas Lister, MD   2 Units at 01/26/12 2155  . metFORMIN (GLUCOPHAGE) tablet 1,000 mg  1,000 mg Oral BID WC Ivy Tye Savoy, MD   1,000 mg at 01/27/12 [phone]  . mupirocin ointment (BACTROBAN) 2 % 1 application  1 application Nasal BID Leighton Roach McDiarmid, MD   1 application at 01/25/12 1047  . mupirocin ointment (BACTROBAN) 2 %   Nasal BID Josalyn Funches, MD      . mupirocin ointment (BACTROBAN) 2 %   Topical PRN Marena Chancy, MD      . nitroGLYCERIN (NITROGLYN) 2 % ointment 1 inch  1 inch Topical Once Dayton Bailiff, MD      . pantoprazole (PROTONIX) EC tablet 40 mg  40 mg Oral Q1200 Josalyn Funches, MD   40 mg at 01/26/12 1200  . pneumococcal 23 valent vaccine (PNU-IMMUNE) injection 0.5  mL  0.5 mL Intramuscular Tomorrow-1000 Todd D McDiarmid, MD      . polyethylene glycol (MIRALAX / GLYCOLAX) packet 17 g  17 g Oral Daily Josalyn Funches, MD   17 g at 01/25/12 1038  . potassium chloride SA (K-DUR,KLOR-CON) CR tablet 20 mEq  20 mEq Oral BID Shelly Flatten, MD   20 mEq at 01/26/12 2121  . tiotropium (SPIRIVA) inhalation capsule 18 mcg  18 mcg Inhalation Daily Dessa Phi, MD   18 mcg at 01/27/12 0801  . torsemide (DEMADEX) tablet 20 mg  20 mg Oral BID Marena Chancy, MD   20 mg at 01/26/12 2024  . traZODone (DESYREL) tablet 150 mg  150 mg Oral QHS Marena Chancy, MD   150 mg at 01/26/12 2120  . DISCONTD: acetaminophen (TYLENOL) tablet 325 mg  325 mg Oral Q4H PRN Edd Arbour, MD   325 mg at 01/26/12 0420  . DISCONTD: ALPRAZolam Prudy Feeler) tablet 0.5 mg  0.5 mg Oral TID Edd Arbour, MD   0.5 mg at 01/25/12 2154  . DISCONTD: ibuprofen (ADVIL,MOTRIN) tablet 400 mg  400 mg Oral Q6H PRN Edd Arbour, MD   400 mg at 01/25/12 2131  . DISCONTD: metFORMIN (GLUCOPHAGE) tablet 1,000 mg  1,000 mg Oral BID WC Shelly Flatten, MD         PE: Gen: NAD  HEENT: MMM  CV: RRR, 2+ pulses in all extremities  JXB:JYNW, normal effort. 02 sats 100% on 2L [place], normal effort.  Ext/Musc: 2+ LE edema. No calf swelling. Non-painful to palpation.  Neuro: CN grossly intact  Psych: Pt alert and oriented x3. Mentation better than previously.    Labs/Studies:  CBG (last 3)   Basename 01/27/12 0624 01/26/12 2132 01/26/12 1613  GLUCAP 201* 225* 191*   BMET pending    Assessment/Plan:  [name] is a 70 y.o. F w/ continued bilateral LE edema and improving sedation   1. LE edema: Likely multifactorial from worsening chronic venous insufficiency and mild diastolic congestive heart failure. Dopplers negative for DVT. Was on torsemide 80mg  bid, with an increase in bicarb. Changed to torsemide 20mg  bid (home dose is torsemide 20mg  in morning and 10mg  at night). Net down 10.1L since admission  -  Continue home torsemide dose  - continue strict ins and outs and daily weights.  - DC foley catheter  - PT/OT  - BMET pending  - ambulate pt.   2. CV: Previous h/o diastolic HF. Possibly contributing to LE edema. troponins negative x3 BNP 516 on admission. Echo 60-75%  -  torsemide 20mg  po bid   3. Hyponatremia/: On significant diuresis plan since admission. Hyponatremia and will likely improve now that diuresis has been tapered to home dose.  - BMET pending   3. Polypharmacy: Patient taking multiple medications for pain anxiety, bipolar depression, insomnia and chronic pain. Psych is managing and wants pt to undergo withdrawal here in hospital. All opiods and Seroquel were DCd yesterday afternoon.Buspar was started yesterday. On depakote and neurontin still, this will provide some seizure coverage. Reviewed home dose of Trazadone and confirmed 150mg  prior to admission. - Thorazine 25mg  TID  - xanax decreased to 0.5mg  Qday x1 then DC  - Continue Buspar 10mg  BID  - continue w/ trazadone 150mg   - monitor sedation  - will further review condition w/ psych   4. COPD: Doing well on 2L [place]. Home O2 of 2-3L. 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 Will monitor.  - Continue O2 as needed.  - continue spiriva   5. R hand wound: Wound dressing CDI.  - Continue current wound care BID per wound care team   6. DM: CBG continues to be elevated, w/ some improvement since starting metformin. Will consider Lantus if no further improvement w/ Metformin - SSI  - metformin    7. FEN/GI: Tolerating carb modified diet. Hyponatremia as above.  - Contiue carb modified diet  - Daily CBGs.  - Kdur BID   8. Prophylaxis: Lovenox 40 mg [place] q daily.   9. Disposition: SNF pending work-up and placement by SW (family declining return to [facility]). Likely DC today/tomorrow       Signed: Shelly Flatten, MD Family Medicine Resident PGY-1 01/27/2012 9:04  AM

## 2012-01-27 NOTE — Progress Notes (Signed)
Nursing Note: Bedtime meds were given early and pt has been calm and then sound asleep since meds were given.wbb

## 2012-01-27 NOTE — Plan of Care (Signed)
Problem: Phase I Progression Outcomes Goal: Voiding-avoid urinary catheter unless indicated Outcome: Not Progressing Pt refuses to have the foley catheter removed see progress notes.

## 2012-01-27 NOTE — Progress Notes (Signed)
Inpatient Diabetes Program Recommendations  AACE/ADA: New Consensus Statement on Inpatient Glycemic Control (2009)  Target Ranges:  Prepandial:   less than 140 mg/dL      Peak postprandial:   less than 180 mg/dL (1-2 hours)      Critically ill patients:  140 - 180 mg/dL   Results for [name], [name] (MRN [id number]) as of 01/27/2012 14:45  Ref. Range 01/26/2012 11:44 01/26/2012 16:13 01/26/2012 21:32 01/27/2012 06:24 01/27/2012 11:10  Glucose-Capillary Latest Range: 70-99 mg/dL 409 (H) 811 (H) 914 (H) 201 (H) 223 (H)    Inpatient Diabetes Program Recommendations Insulin - Basal: Lantus 10 units

## 2012-01-27 NOTE — Progress Notes (Signed)
1700 pt refused to have foley discontinued as ordered. Made her aware of order from MD . prevention of infection , and preparation to be discharged. But still no avail. Daughter aware

## 2012-01-27 NOTE — Progress Notes (Signed)
Nursing Note: [email or web address]

## 2012-01-27 NOTE — Plan of Care (Signed)
Problem: Phase I Progression Outcomes Goal: Up in chair, BRP Outcome: Completed/Met Date Met:  01/27/12 Able to complete with one person assist using the walker.

## 2012-01-27 NOTE — Progress Notes (Signed)
Nursing Note: aleep,re-positioned by staffwbb

## 2012-01-28 LAB — GLUCOSE, CAPILLARY
Glucose-Capillary: 181 mg/dL — ABNORMAL HIGH (ref 70–99)
Glucose-Capillary: 174 mg/dL — ABNORMAL HIGH (ref 70–99)
Glucose-Capillary: 186 mg/dL — ABNORMAL HIGH (ref 70–99)
Glucose-Capillary: 161 mg/dL — ABNORMAL HIGH (ref 70–99)

## 2012-01-28 LAB — BASIC METABOLIC PANEL
BUN: 14 mg/dL (ref 6–23)
CO2: 22 mEq/L (ref 19–32)
Calcium: 9 mg/dL (ref 8.4–10.5)
Creatinine, Ser: 0.43 mg/dL — ABNORMAL LOW (ref 0.50–1.10)
GFR calc Af Amer: >90 mL/min (ref >90)

## 2012-01-28 MED ORDER — LOPERAMIDE HCL 2 MG PO CAPS
2.0000 mg | ORAL_CAPSULE | ORAL | Status: DC | PRN
  Administered 2012-01-28 – 2012-01-30 (×2): 2 mg via ORAL
  Filled 2012-01-28 (×2): qty 1

## 2012-01-28 MED ORDER — SODIUM CHLORIDE 0.9 % IV SOLN
INTRAVENOUS | Status: DC
  Administered 2012-01-28 – 2012-01-29 (×3): via INTRAVENOUS

## 2012-01-28 NOTE — Progress Notes (Signed)
Occupational Therapy Treatment Patient Details Name: [name] MRN: [id number] DOB: 1941-02-22 Today's Date: 01/28/2012 9:52-10:39   OT Assessment/Plan OT Assessment/Plan Comments on Treatment Session: This 71 yo female making progress but is somewhat also self limiting due to psy issues. Will continue to benefit from acute and follow-up OT at SNF. OT Plan: Discharge plan remains appropriate OT Frequency: Min 1X/week Follow Up Recommendations: Skilled nursing facility Equipment Recommended: Defer to next venue OT Goals ADL Goals ADL Goal: Grooming - Progress: Progressing toward goals ADL Goal: Upper Body Bathing - Progress: Progressing toward goals ADL Goal: Lower Body Bathing - Progress: Progressing toward goals ADL Goal: Upper Body Dressing - Progress: Progressing toward goals ADL Goal: Lower Body Dressing - Progress: Not progressing  OT Treatment Precautions/Restrictions  Precautions Precautions: Fall Required Braces or Orthoses: Yes Other Brace/Splint: Cam walker on RLE--per pt her daughter brought it in yesterday. Restrictions Other Position/Activity Restrictions: Evidently needs cam boot on with ambulation   ADL ADL Eating/Feeding: Not assessed Grooming: Performed;Wash/dry face;Set up Where Assessed - Grooming: Sitting, chair;Supported Upper Body Bathing: Performed;Supervision/safety;Set up Where Assessed - Upper Body Bathing: Supported;Sitting, chair Lower Body Bathing: Performed;Moderate assistance (needs A for lower legs and feet) Where Assessed - Lower Body Bathing: Supported;Sit to stand from chair Upper Body Dressing: Simulated;Set up Where Assessed - Upper Body Dressing: Supported;Sitting, chair Lower Body Dressing: Simulated;Maximal assistance Where Assessed - Lower Body Dressing: Supported;Sit to stand from chair Toilet Transfer: Not assessed Toileting - Clothing Manipulation: Not assessed Where Assessed - Toileting Clothing Manipulation: Not  assessed Toileting - Hygiene: Not assessed Tub/Shower Transfer: Not assessed Tub/Shower Transfer Method: Not assessed Ambulation Related to ADLs: Min A sit to stand and stand to sit chair ADL Comments: Pt definitely has anxiety issues and was getting quite unhappy that the nurse had not brought her meds yet. Pt asked me put her compression stockings on her and I told her that it would be also good if she would recline with her legs up--she declined having her legs up. Before I left the room she insisted on me taking her compression stockings off. Mobility  Bed Mobility Bed Mobility: No Transfers Transfers: No Sit to Stand: 4: Min assist;With upper extremity assist;From chair/3-in-1;With armrests Stand to Sit: 4: Min assist;With upper extremity assist;To chair/3-in-1;With armrests Exercises    End of Session OT - End of Session Activity Tolerance: Patient tolerated treatment well Patient left: in chair;with call bell in reach;Other (comment) (phone within reach) General Behavior During Session: Restless Cognition: Impaired  Evette Georges [phone] 01/28/2012, 2:13 PM

## 2012-01-28 NOTE — Progress Notes (Signed)
Family Medicine Teaching Service [facility] Progress Note  Patient name: [name] Medical record number: [id number] Date of birth: 1941/05/09 Age: 71 y.o. Gender: female    LOS: 8 days   Primary Care Provider: Ellery Plunk, MD, MD  Overnight Events:  Complaining of multiple bouts of diarrhea which kept pt awake,( confirmed by nursing as loose nonbloody, nonmucousy diarrhea). Pt able to sleep 6 hrs continuously. Pt significantly less tearful today. Pt still resistant to having foley taken out. Discussed risks of foley. Pt agreable to having foley taken out. Pt will need assistance to use the bathroom.   Denies CP, SOB, nausea, vomiting, fever, rash, syncope, HA   Objective: Vital signs in last 24 hours: Temp:  [97.6 F (36.4 C)-98.5 F (36.9 C)] 97.8 F (36.6 C) (02/08 0414) Pulse Rate:  [89-107] 107  (02/08 0414) Resp:  [19-22] 20  (02/08 0414) BP: (122-146)/(75-92) 145/83 mmHg (02/08 0414) SpO2:  [98 %-100 %] 98 % (02/08 0946) Weight:  [170 lb 6.7 oz (77.3 kg)] 170 lb 6.7 oz (77.3 kg) (02/08 0414)  Wt Readings from Last 3 Encounters:  01/28/12 170 lb 6.7 oz (77.3 kg)  11/15/11 180 lb (81.647 kg)  10/27/11 177 lb (80.287 kg)     Current Facility-Administered Medications  Medication Dose Route Frequency Provider Last Rate Last Dose  . acetaminophen (TYLENOL) tablet 650 mg  650 mg Oral Q4H PRN Shelly Flatten, MD   650 mg at 01/27/12 1952  . albuterol (PROVENTIL) (5 MG/ML) 0.5% nebulizer solution 2.5 mg  2.5 mg Nebulization Q4H PRN Leighton Roach McDiarmid, MD      . ALPRAZolam Prudy Feeler) tablet 0.5 mg  0.5 mg Oral Daily Shelly Flatten, MD   0.5 mg at 01/27/12 1013  . busPIRone (BUSPAR) tablet 10 mg  10 mg Oral BID Edd Arbour, MD   10 mg at 01/27/12 2110  . chlorproMAZINE (THORAZINE) tablet 25 mg  25 mg Oral TID Edd Arbour, MD   25 mg at 01/27/12 2108  . cloNIDine (CATAPRES - Dosed in mg/24 hr) patch 0.2 mg  0.2 mg Transdermal Weekly Edd Arbour, MD   0.2 mg at 01/24/12 1532    . divalproex (DEPAKOTE ER) 24 hr tablet 750 mg  750 mg Oral BID Dessa Phi, MD   750 mg at 01/27/12 2108  . enoxaparin (LOVENOX) injection 40 mg  40 mg Subcutaneous Q24H Josalyn Funches, MD   40 mg at 01/27/12 2114  . fluticasone (FLONASE) 50 MCG/ACT nasal spray 1 spray  1 spray Each Nare Daily Edd Arbour, MD   1 spray at 01/27/12 1021  . Fluticasone-Salmeterol (ADVAIR) 500-50 MCG/DOSE inhaler 1 puff  1 puff Inhalation BID Dessa Phi, MD   1 puff at 01/28/12 0945  . gabapentin (NEURONTIN) capsule 900 mg  900 mg Oral TID Leighton Roach McDiarmid, MD   900 mg at 01/27/12 2109  . hydrOXYzine (ATARAX/VISTARIL) tablet 50 mg  50 mg Oral TID PRN Edd Arbour, MD   50 mg at 01/28/12 0645  . ibuprofen (ADVIL,MOTRIN) tablet 400 mg  400 mg Oral Q4H PRN Shelly Flatten, MD   400 mg at 01/28/12 4098  . insulin aspart (novoLOG) injection 0-15 Units  0-15 Units Subcutaneous TID WC Ivy Tye Savoy, MD   3 Units at 01/28/12 0645  . insulin aspart (novoLOG) injection 0-5 Units  0-5 Units Subcutaneous QHS Barnabas Lister, MD   2 Units at 01/26/12 2155  . metFORMIN (GLUCOPHAGE) tablet 1,000 mg  1,000 mg Oral BID WC Ivy  Tye Savoy, MD   1,000 mg at 01/28/12 0645  . mupirocin ointment (BACTROBAN) 2 %   Nasal BID Josalyn Funches, MD      . mupirocin ointment (BACTROBAN) 2 %   Topical PRN Marena Chancy, MD      . nitroGLYCERIN (NITROGLYN) 2 % ointment 1 inch  1 inch Topical Once Dayton Bailiff, MD      . pantoprazole (PROTONIX) EC tablet 40 mg  40 mg Oral Q1200 Josalyn Funches, MD   40 mg at 01/27/12 1401  . pneumococcal 23 valent vaccine (PNU-IMMUNE) injection 0.5 mL  0.5 mL Intramuscular Tomorrow-1000 Leighton Roach McDiarmid, MD   0.5 mL at 01/27/12 1642  . polyethylene glycol (MIRALAX / GLYCOLAX) packet 17 g  17 g Oral Daily Josalyn Funches, MD   17 g at 01/27/12 1013  . potassium chloride SA (K-DUR,KLOR-CON) CR tablet 20 mEq  20 mEq Oral BID Shelly Flatten, MD   20 mEq at 01/27/12 2109  . tiotropium (SPIRIVA) inhalation  capsule 18 mcg  18 mcg Inhalation Daily Dessa Phi, MD   18 mcg at 01/28/12 0945  . torsemide (DEMADEX) tablet 20 mg  20 mg Oral BID Marena Chancy, MD   20 mg at 01/28/12 0912  . traZODone (DESYREL) tablet 150 mg  150 mg Oral QHS Marena Chancy, MD   150 mg at 01/27/12 2109  . venlafaxine (EFFEXOR-XR) 24 hr capsule 300 mg  300 mg Oral QHS Shelly Flatten, MD   300 mg at 01/27/12 2109     PE: Gen: NAD  HEENT: MMM  CV: RRR, 2+ pulses in all extremities  WUJ:WJXB, normal effort. 02 sats 100% on 2L [place], normal effort.  Ext/Musc: 1+ LE edema. No calf swelling. Non-painful to palpation. R orthotic boot in place.  Neuro: CN grossly intact  Psych: Pt alert and oriented x3. Mentation better than previously.   Labs/Studies:  Basic Metabolic Panel:    Component Value Date/Time   NA 124* 01/27/2012 0959   K 5.2* 01/27/2012 0959   CL 90* 01/27/2012 0959   CO2 17* 01/27/2012 0959   BUN 12 01/27/2012 0959   CREATININE 0.38* 01/27/2012 0959   CREATININE 0.73 03/24/2011 1614   GLUCOSE 253* 01/27/2012 0959   CALCIUM 9.1 01/27/2012 0959    CBG (last 3)   Basename 01/28/12 0601 01/27/12 2101 01/27/12 1612  GLUCAP 181* 146* 201*        Assessment/Plan:  [name] is a 72 y.o. F w/ improved bilateral LE edema and improving sedation after weaning from benzodiazepine and opioids.   1. LE edema: Likely multifactorial from worsening chronic venous insufficiency and mild diastolic congestive heart failure. Dopplers negative for DVT. Was on torsemide 80mg  bid, with an increase in bicarb. Changed to torsemide 20mg  bid (home dose is torsemide 20mg  in morning and 10mg  at night). Net down 10L since admission  - DC home torsemide due to no further diuresis and electrolyte abnormalities.   - continue strict ins and outs and daily weights.  - DC foley catheter. Discussed w/ pt and she is amenable.  - PT/OT  - ambulate pt.   2. CV: Previous h/o diastolic HF. Possibly contributing to LE edema. troponins  negative x3 BNP 516 on admission. Echo 60-75%  - Will consider restarting torsemide later   3. Hyponatremia/: On significant diuresis plan since admission. Hyponatremia likely from diuresis despite putting pt on reduced dose.  - DC torsemide.  - NS 176ml/hr - BMET in the pm/am  3. Polypharmacy: On admission patient taking multiple medications for pain anxiety, bipolar depression, insomnia and chronic pain. Psych assisted w/ wean process. All opiods and Xanax have been DCd. Buspar was started yesterday. On depakote and neurontin still, this will provide some seizure coverage. Effexor started again yesterday. Reviewed home dose of Trazadone and confirmed 150mg  prior to admission.  - Thorazine 25mg  TID  - Effexor 300mg  Qhs  - Continue Buspar 10mg  BID  - continue w/ trazadone 150mg   - monitor sedation   4. COPD: Doing well on 2L [place]. Home O2 of 2-3L. 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 Will monitor.  - Continue O2 as needed.  - continue spiriva   5. R hand wound: Wound dressing CDI.  - Continue current wound care BID per wound care team   6. DM: CBG continues to be elevated, w/ some improvement since starting metformin. Will consider Lantus if no further improvement w/ Metformin  - SSI  - metformin   7. FEN/GI: Tolerating carb modified diet. Hyponatremia as above. Hyponatremia as above. Hyperkalemia likely from hemolysis - Contiue carb modified diet  - Daily CBGs.  - Kdur BID   8. Prophylaxis: Lovenox 40 mg [place] q daily.   9. Disposition: SNF pending work-up and placement by SW (family declining return to [facility]). Likely DC today/tomorrow    Signed: Shelly Flatten, MD Family Medicine Resident PGY-1 01/28/2012 10:03 AM

## 2012-01-28 NOTE — Progress Notes (Signed)
Physical Therapy Treatment Patient Details Name: [name] MRN: [id number] DOB: 15-Apr-1941 Today's Date: 01/28/2012  PT Assessment/Plan  PT - Assessment/Plan Comments on Treatment Session: pt stil unable to stop perseverating on pain and medicines, but mentation appears improved, outbursts are minimized. PT Plan: Discharge plan remains appropriate Follow Up Recommendations: Skilled nursing facility Equipment Recommended: Defer to next venue PT Goals  Acute Rehab PT Goals PT Goal: Supine/Side to Sit - Progress: Progressing toward goal PT Goal: Sit to Stand - Progress: Progressing toward goal PT Transfer Goal: Bed to Chair/Chair to Bed - Progress: Progressing toward goal PT Goal: Ambulate - Progress: Progressing toward goal  PT Treatment Precautions/Restrictions  Precautions Precautions: Fall Required Braces or Orthoses: Yes Other Brace/Splint: Cam walker on RLE--per pt her daughter brought it in yesterday. Restrictions Weight Bearing Restrictions: No Other Position/Activity Restrictions: Evidently needs cam boot on with ambulation Mobility (including Balance) Bed Mobility Bed Mobility: Yes Supine to Sit: 5: Supervision Supine to Sit Details (indicate cue type and reason): cues to bridge to the edge first before getting up Sitting - Scoot to Edge of Bed: 5: Supervision Sit to Supine: 4: Min assist;HOB flat Sit to Supine - Details (indicate cue type and reason): need very minimal assist to get one leg into the bed Transfers Transfers: Yes Sit to Stand: 4: Min assist;From bed;From chair/3-in-1 Sit to Stand Details (indicate cue type and reason): vc's for hand placement; assist to come forward over her BOS Stand to Sit: 4: Min assist;With upper extremity assist;To chair/3-in-1;To bed Stand to Sit Details: assist to control descent, vc's to reach back to armrests Ambulation/Gait Ambulation/Gait: Yes Ambulation/Gait Assistance: Other (comment) (min guard A) Ambulation/Gait  Assistance Details (indicate cue type and reason): vc's to stay closer in to the RW and for postural checks Ambulation Distance (Feet): 260 Feet Assistive device: Rolling walker Gait Pattern: Step-through pattern;Trunk flexed (variable step and stride lengths, irradic use of the RW) Gait velocity: much faster speed with the camwalker boot on Stairs: No  Posture/Postural Control Posture/Postural Control: No significant limitations Balance Balance Assessed: No Exercise    End of Session PT - End of Session Equipment Utilized During Treatment: Right ankle foot orthosis;Other (comment) (camwalker) Activity Tolerance: Patient tolerated treatment well Patient left: in chair;with call bell in reach Nurse Communication: Mobility status for transfers;Mobility status for ambulation General Behavior During Session: Restless Cognition: Impaired  Brienne Liguori, Eliseo Gum 01/28/2012, 4:29 PM  01/28/2012  [place] Bing, PT [phone] (pager)

## 2012-01-28 NOTE — Progress Notes (Signed)
FMTS Attending Note  Patient seen and examined by me today, she is much less labile than in previous days.  Resting tremor is improved as well.  She is resistant to removal of urinary catheter today.    Of note, her serum sodium is low today (124); likely secondary to aggressive diuresis with torsemide.    Plan to correct her hyponatremia today and consider for discharge to SNF facility tomorrow.  The patient is resistant to the idea of discharge to SNF; will reassess her sodium and ensure it is moving toward normality before discharge. Paula Compton, MD

## 2012-01-28 NOTE — Progress Notes (Signed)
Clinical Child psychotherapist (CSW) visited pt room. Pt stated she would be agreeable to going to [place] however she is not agreeable to going today or tomorrow. Pt states she feels weak and does not feel she'll be ready to dc until Monday. CSW informed that MD will determine that and that CSW will inform pt when pt would dc. CSW confirmed with facility that pt can dc tomorrow to [facility] if stable. CSW will continue to follow.  Theresia Bough, MSW, Theresia Majors [phone]

## 2012-01-28 NOTE — Progress Notes (Addendum)
Clinical Child psychotherapist (CSW) just informed by Delaney Meigs at [facility] that they will no longer be able to offer pt placement due to pt previous admissions at their facility. CSW will contact pt daughter to provide update. CSW will refax pt out to facilities in [facility] for possible new offers.  Theresia Bough, MSW, Theresia Majors [phone]

## 2012-01-28 NOTE — Discharge Summary (Signed)
Family Medicine Resident Discharge Summary  Patient ID: [name] [id number] 70 y.o. 20-Apr-1941  Admit date: 01/20/2012  Discharge date and time: No discharge date for patient encounter.   Admitting Physician: Leighton Roach McDiarmid, MD   Discharge Physician: Doralee Albino, MD  Admission Diagnoses:  R hand laceration,  sedation,  V70.1/LEG SWELLING  Discharge Diagnoses:  - Leg swelling - R hand laceration - sedation secondary to overmedication  Active Problems: Anxiety Other chronic pain HTN Chronic venous insufficiency.  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   1. Bilateral leg swelling:  Lower extremity doppler was negative for DVT. With history of diastolic congestive heart failure, an echo and cardiac enzymes were obtained to evaluate right ventricle and left ventricle function and to rule out new onset valvular disease and acute coronary syndrome. Cardiac echo and cardiac enzymes were all within normal limits. With normal WBC and patient being afebrile, cellulitis was less likely cause for edema. Torsemide was increased to 80mg  twice daily with improvement of swelling. Given that patient's bicarb was increasing, torsemide was reduced to 20mg  twice daily and lower extremity swelling remained stable. Torsemide was discontinued just prior to DC due to hyponatremia and the fact that the Pts total net fluid output was 7 liters  2. Polypharmacy and sedation: Patient was on multiple medications for bipolar disorder, anxiety and pain. Patient was kept on xanax, depakote, gabapentin, seroquel and oxycontin. Trazadone, phenergan, abilify and  melatonin were initially stopped. Patient returned to baseline mental status and trazadone was reintroduced to help with sleeping. Psychiatry was consulted who recommended stopping benzos and opiates as the dependence may be a contributing factor in her heightened anxiety and pain. Pt underwent wean very well w/o seizure or agitation. Pt mentation and depression significantly improved by DC. Pt Rx at DC included Thorazine 25mg TID, trazadone 50, Seroquel 25mg  QHS Effexor 300mg QHS, Buspar 10mg BID, depakote 750mg  BID, and neurontin 900 TID.     3. COPD: Doing well on 2L [place]. Home O2 of 2-3L  - Continue O2 as needed.  - continue spiriva   4. DM: glucose well controlled on SSI and Home metformin.   5. Electrolytes: Pt noted to be mildly hypokalemic and hyponatremic at times during admission. These values were monitored and treated w/ replacement therapies during admission.   Consults:  Psychiatry  Significant Diagnostic Studies:  2D cardiac Echo with contrast:  - Left ventricle: The cavity size was normal. Wall thickness was normal. Systolic function was vigorous. The estimated ejection fraction was in the range of 65% to 70%. - Right ventricle: The cavity size was mildly dilated.  LE Doppler: No obvious evidence of deep vein or superficial thrombosis involving the right lower extremity and left lower extremity.  Troponins: negative  HgBA1c: 6.9  Vit B12: 298   Discharge Exam: Gen: NAD, smiling  HEENT: MMM  CV: RRR, 2+ pulses in all extremities  KVQ:QVZD, normal effort. 02 sats 100% on 1L [place], normal effort.  Ext/Musc: 1+ LE edema. No calf swelling. Non-painful to palpation. R orthotic boot in place.  Neuro: CN grossly intact  Psych: Pt alert and oriented x3. Mentation better than previously. Non-labile. No crying   Disposition: long term care facility  Discharge  Medications:  [name], [name]  Home Medication Instructions [id number]   Printed on:01/31/12 1348  Medication  Information                    ENSURE (ENSURE) Take 1 Can by mouth 3 (three) times daily with meals.             metFORMIN (GLUCOPHAGE) 1000 MG tablet Take 1 tablet (1,000 mg total) by mouth 2 (two) times daily with a meal.           Fluticasone-Salmeterol (ADVAIR DISKUS) 500-50 MCG/DOSE AEPB Inhale 1 puff into the lungs 2 (two) times daily.           tiotropium (SPIRIVA) 18 MCG inhalation capsule Place 1 capsule (18 mcg total) into inhaler and inhale daily.           potassium chloride (K-DUR,KLOR-CON) 10 MEQ tablet Take 5-10 mEq by mouth 2 (two) times daily. Takes in the am and 5 meq at bedtime           polyethylene glycol (MIRALAX / GLYCOLAX) packet Take 17 g by mouth daily.             nystatin-triamcinolone (MYCOLOG II) cream Apply 1 application topically 4 (four) times daily.             metoprolol tartrate (LOPRESSOR) 25 MG tablet Take 0.5 tablets (12.5 mg total) by mouth 2 (two) times daily.           promethazine (PHENERGAN) 25 MG tablet Take 25 mg by mouth every 6 (six) hours as needed. nausea           divalproex (DEPAKOTE ER) 250 MG 24 hr tablet Take 750 mg by mouth 2 (two) times daily.            vitamin C (ASCORBIC ACID) 500 MG tablet Take 500 mg by mouth 2 (two) times daily.           omeprazole (PRILOSEC) 20 MG capsule Take 20 mg by mouth daily.           acetaminophen (TYLENOL) 325 MG tablet Take 2 tablets (650 mg total) by mouth every 6 (six) hours as needed for pain.           busPIRone (BUSPAR) 10 MG tablet Take 1 tablet (10 mg total) by mouth 2 (two) times daily.           chlorproMAZINE (THORAZINE) 25 MG tablet Take 1 tablet (25 mg total) by mouth 3 (three) times daily.           gabapentin (NEURONTIN) 300 MG capsule Take 3 capsules (900 mg total) by mouth 3 (three) times daily.           hydrOXYzine (ATARAX/VISTARIL) 50 MG tablet Take 1 tablet (50 mg total) by mouth every 6 (six) hours as needed for anxiety.           ibuprofen  (ADVIL,MOTRIN) 400 MG tablet Take 1 tablet (400 mg total) by mouth every 6 (six) hours as needed.           loperamide (IMODIUM) 2 MG capsule Take 1 capsule (2 mg total) by mouth as needed for diarrhea or loose stools.           QUEtiapine (SEROQUEL) 25 MG tablet Take 1 tablet (25 mg total) by mouth at bedtime.           torsemide (DEMADEX) 10 MG tablet Take 2 tablets (20 mg total) by mouth  2 (two) times daily as needed (lower extremity swelling). Takes 2 tablets every morning for 20mg  dosage and takes 1 tablet at night for 10mg  dosage           traZODone (DESYREL) 50 MG tablet Take 1 tablet (50 mg total) by mouth at bedtime.           venlafaxine (EFFEXOR-XR) 150 MG 24 hr capsule Take 2 capsules (300 mg total) by mouth at bedtime.              Activity: activity as tolerated Diet: regular diet Wound Care: keep wound clean and dry  Follow-up with PCP, Dr Ayesha Mohair as needed  Follow-up Items: 1. LE swelilng. Ambulate pt regularly and apply compression stockings as tolerated and elevate legs  Signed: Shelly Flatten, MD Family Medicine Resident PGY-1 01/31/2012 1:46 PM

## 2012-01-29 LAB — CBC
Hemoglobin: 11.8 g/dL — ABNORMAL LOW (ref 12.0–15.0)
MCH: 30.2 pg (ref 26.0–34.0)
Platelets: 189 10*3/uL (ref 150–400)
RBC: 3.91 MIL/uL (ref 3.87–5.11)
WBC: 4.6 10*3/uL (ref 4.0–10.5)

## 2012-01-29 LAB — BASIC METABOLIC PANEL
CO2: 25 mEq/L (ref 19–32)
Calcium: 9 mg/dL (ref 8.4–10.5)
Chloride: 96 mEq/L (ref 96–112)
Glucose, Bld: 162 mg/dL — ABNORMAL HIGH (ref 70–99)
Sodium: 131 mEq/L — ABNORMAL LOW (ref 135–145)

## 2012-01-29 LAB — GLUCOSE, CAPILLARY
Glucose-Capillary: 221 mg/dL — ABNORMAL HIGH (ref 70–99)
Glucose-Capillary: 158 mg/dL — ABNORMAL HIGH (ref 70–99)
Glucose-Capillary: 151 mg/dL — ABNORMAL HIGH (ref 70–99)
Glucose-Capillary: 109 mg/dL — ABNORMAL HIGH (ref 70–99)

## 2012-01-29 MED ORDER — ONDANSETRON HCL 4 MG PO TABS
4.0000 mg | ORAL_TABLET | Freq: Three times a day (TID) | ORAL | Status: DC | PRN
  Administered 2012-01-29: 4 mg via ORAL
  Filled 2012-01-29: qty 1

## 2012-01-29 NOTE — Progress Notes (Signed)
FMTS Attending Note  Patient seen and examined by me today, discussed with Dr. Konrad Dolores and I agree with his assessment and plan. Patient appears much calmer than previously, eating breakfast and in no apparent distress.  Has many questions about discharge plan; states she will not go to [place] or [facility].  She is medically appropriate for discharge, pending placement issue resolution.   Paula Compton, MD Saturday, Feb 9th, 2013

## 2012-01-29 NOTE — Progress Notes (Signed)
Family Medicine Teaching Service [facility] Progress Note  Patient name: [name] Medical record number: [id number] Date of birth: 1941-05-19 Age: 71 y.o. Gender: female    LOS: 9 days   Primary Care Provider: Ellery Plunk, MD, MD  Overnight Events:    Objective: Vital signs in last 24 hours: Temp:  [98.5 F (36.9 C)-98.8 F (37.1 C)] 98.5 F (36.9 C) (02/09 0550) Pulse Rate:  [89-111] 96  (02/09 0550) Resp:  [16-22] 18  (02/09 0550) BP: (130-139)/(77-83) 130/83 mmHg (02/09 0550) SpO2:  [93 %-100 %] 98 % (02/09 0757) Weight:  [174 lb 6.1 oz (79.1 kg)] 174 lb 6.1 oz (79.1 kg) (02/09 0550)  Wt Readings from Last 3 Encounters:  01/29/12 174 lb 6.1 oz (79.1 kg)  11/15/11 180 lb (81.647 kg)  10/27/11 177 lb (80.287 kg)     Current Facility-Administered Medications  Medication Dose Route Frequency Provider Last Rate Last Dose  . 0.9 %  sodium chloride infusion   Intravenous Continuous Kristen Flatten, MD 125 mL/hr at 01/29/12 0349    . acetaminophen (TYLENOL) tablet 650 mg  650 mg Oral Q4H PRN Kristen Flatten, MD   650 mg at 01/28/12 1222  . albuterol (PROVENTIL) (5 MG/ML) 0.5% nebulizer solution 2.5 mg  2.5 mg Nebulization Q4H PRN Kristen Roach McDiarmid, MD      . busPIRone (BUSPAR) tablet 10 mg  10 mg Oral BID Kristen Arbour, MD   10 mg at 01/28/12 2127  . chlorproMAZINE (THORAZINE) tablet 25 mg  25 mg Oral TID Kristen Arbour, MD   25 mg at 01/28/12 2129  . cloNIDine (CATAPRES - Dosed in mg/24 hr) patch 0.2 mg  0.2 mg Transdermal Weekly Kristen Arbour, MD   0.2 mg at 01/24/12 1532  . divalproex (DEPAKOTE ER) 24 hr tablet 750 mg  750 mg Oral BID Kristen Phi, MD   750 mg at 01/28/12 2128  . enoxaparin (LOVENOX) injection 40 mg  40 mg Subcutaneous Q24H Kristen Funches, MD   40 mg at 01/28/12 2127  . fluticasone (FLONASE) 50 MCG/ACT nasal spray 1 spray  1 spray Each Nare Daily Kristen Arbour, MD   1 spray at 01/28/12 1058  . Fluticasone-Salmeterol (ADVAIR) 500-50 MCG/DOSE inhaler 1  puff  1 puff Inhalation BID Kristen Phi, MD   1 puff at 01/29/12 0757  . gabapentin (NEURONTIN) capsule 900 mg  900 mg Oral TID Kristen Roach McDiarmid, MD   900 mg at 01/28/12 2125  . hydrOXYzine (ATARAX/VISTARIL) tablet 50 mg  50 mg Oral TID PRN Kristen Arbour, MD   50 mg at 01/28/12 2233  . ibuprofen (ADVIL,MOTRIN) tablet 400 mg  400 mg Oral Q4H PRN Kristen Flatten, MD   400 mg at 01/28/12 2233  . insulin aspart (novoLOG) injection 0-15 Units  0-15 Units Subcutaneous TID WC Kristen Tye Savoy, MD   5 Units at 01/29/12 [phone]  . insulin aspart (novoLOG) injection 0-5 Units  0-5 Units Subcutaneous QHS Kristen Lister, MD   2 Units at 01/26/12 2155  . loperamide (IMODIUM) capsule 2 mg  2 mg Oral PRN Kristen Flatten, MD   2 mg at 01/28/12 1053  . metFORMIN (GLUCOPHAGE) tablet 1,000 mg  1,000 mg Oral BID WC Kristen Tye Savoy, MD   1,000 mg at 01/29/12 0630  . mupirocin ointment (BACTROBAN) 2 %   Nasal BID Kristen Funches, MD      . mupirocin ointment (BACTROBAN) 2 %   Topical PRN Kristen Chancy, MD      .  nitroGLYCERIN (NITROGLYN) 2 % ointment 1 inch  1 inch Topical Once Kristen Bailiff, MD      . pantoprazole (PROTONIX) EC tablet 40 mg  40 mg Oral Q1200 Kristen Funches, MD   40 mg at 01/28/12 1224  . polyethylene glycol (MIRALAX / GLYCOLAX) packet 17 g  17 g Oral Daily Kristen Funches, MD   17 g at 01/27/12 1013  . potassium chloride SA (K-DUR,KLOR-CON) CR tablet 20 mEq  20 mEq Oral BID Kristen Flatten, MD   20 mEq at 01/28/12 2127  . tiotropium (SPIRIVA) inhalation capsule 18 mcg  18 mcg Inhalation Daily Kristen Phi, MD   18 mcg at 01/29/12 0757  . traZODone (DESYREL) tablet 150 mg  150 mg Oral QHS Kristen Chancy, MD   150 mg at 01/28/12 2128  . venlafaxine (EFFEXOR-XR) 24 hr capsule 300 mg  300 mg Oral QHS Kristen Flatten, MD   300 mg at 01/28/12 2128  . DISCONTD: ALPRAZolam Prudy Feeler) tablet 0.5 mg  0.5 mg Oral Daily Kristen Flatten, MD   0.5 mg at 01/28/12 1120  . DISCONTD: torsemide (DEMADEX) tablet 20 mg  20 mg Oral  BID Kristen Chancy, MD   20 mg at 01/28/12 0912     PE: Gen: NAD  HEENT: MMM  CV: RRR, 2+ pulses in all extremities  ZOX:WRUE, normal effort. 02 sats 100% on 1L [place], normal effort.  Ext/Musc: 2+ LE edema. No calf swelling. Non-painful to palpation. R orthotic boot in place.  Neuro: CN grossly intact  Psych: Pt alert and oriented x3. Mentation better than previously. Non-labile. No crying   Labs/Studies:  Basic Metabolic Panel:    Component Value Date/Time   NA 131* 01/29/2012 0600   K 4.0 01/29/2012 0600   CL 96 01/29/2012 0600   CO2 25 01/29/2012 0600   BUN 10 01/29/2012 0600   CREATININE 0.44* 01/29/2012 0600   CREATININE 0.73 03/24/2011 1614   GLUCOSE 162* 01/29/2012 0600   CALCIUM 9.0 01/29/2012 0600   Lab Results  Component Value Date   WBC 4.6 01/29/2012   HGB 11.8* 01/29/2012   HCT 36.3 01/29/2012   MCV 92.8 01/29/2012   PLT 189 01/29/2012       Assessment/Plan:  [name] is a 71 y.o. F w/ improved bilateral LE edema and improving sedation after weaning from benzodiazepine and opioids.   1. LE edema: Likely multifactorial from worsening chronic venous insufficiency and mild diastolic congestive heart failure. Dopplers negative for DVT. Was on torsemide 80mg  bid, with an increase in bicarb. Changed to torsemide 20mg  bid (home dose is torsemide 20mg  in morning and 10mg  at night). Slightly worse today. Net down 7L since admission (was down 10L previously during admission  - DC home torsemide due to no further diuresis and electrolyte abnormalities.  - continue strict ins and outs and daily weights.  - compression hose - PT/OT  - ambulate pt.   2. CV: Previous h/o diastolic HF. Possibly contributing to LE edema. troponins negative x3 BNP 516 on admission. Echo 60-75%  - Will consider restarting torsemide later   3. Hyponatremia/: 131 today. Significant improvement on NS while off loop diuretic.  - DC NS 118ml/hr due to LE edema worsening  - BMET tomorrow  3.  Polypharmacy: On admission patient taking multiple medications for pain anxiety, bipolar depression, insomnia and chronic pain. Psych assisted w/ wean process. All opiods and Xanax have been DCd. Buspar was started yesterday. On depakote and neurontin still, this will provide some  seizure coverage. Effexor started again yesterday. Reviewed home dose of Trazadone and confirmed 150mg  prior to admission.  - Thorazine 25mg  TID  - Effexor 300mg  Qhs  - Continue Buspar 10mg  BID  - continue w/ trazadone 150mg   - Vistaril 50mg  TID - monitor sedation   4. COPD: Doing well on 2L [place]. Home O2 of 2-3L. 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 Will monitor.  - Continue O2 as needed.  - continue spiriva   5. R hand wound: Wound dressing CDI.  - Continue current wound care BID per wound care team   6. DM: CBG continues to be elevated, w/ some improvement since starting metformin. Will consider Lantus if no further improvement w/ Metformin  - SSI  - metformin   7. FEN/GI: Tolerating carb modified diet. Hyponatremia as above. Hyponatremia as above. Hyperkalemia likely from hemolysis  - Contiue carb modified diet  - Daily CBGs.  - Kdur BID   8. Prophylaxis: Lovenox 40 mg [place] q daily.   9. Disposition: SNF pending work-up and placement by SW (family declining return to [facility]). Likely DC today/tomorrow          Signed: Shelly Flatten, MD Family Medicine Resident PGY-1 01/29/2012 8:33 AM

## 2012-01-30 LAB — BASIC METABOLIC PANEL
BUN: 9 mg/dL (ref 6–23)
Calcium: 9 mg/dL (ref 8.4–10.5)
Creatinine, Ser: 0.44 mg/dL — ABNORMAL LOW (ref 0.50–1.10)
GFR calc Af Amer: >90 mL/min (ref >90)
GFR calc non Af Amer: >90 mL/min (ref >90)
Glucose, Bld: 157 mg/dL — ABNORMAL HIGH (ref 70–99)
Potassium: 4.6 mEq/L (ref 3.5–5.1)

## 2012-01-30 LAB — GLUCOSE, CAPILLARY

## 2012-01-30 MED ORDER — TORSEMIDE 20 MG PO TABS
20.0000 mg | ORAL_TABLET | Freq: Every morning | ORAL | Status: DC
  Administered 2012-01-30 – 2012-01-31 (×2): 20 mg via ORAL
  Filled 2012-01-30 (×2): qty 1

## 2012-01-30 MED ORDER — TRAZODONE HCL 100 MG PO TABS
200.0000 mg | ORAL_TABLET | Freq: Every day | ORAL | Status: DC
  Administered 2012-01-30: 200 mg via ORAL
  Filled 2012-01-30 (×3): qty 2

## 2012-01-30 NOTE — Progress Notes (Signed)
Family Medicine Teaching Service [facility] Progress Note  Patient name: [name] Medical record number: [id number] Date of birth: 1941/12/17 Age: 71 y.o. Gender: female    LOS: 10 days   Primary Care Provider: Ellery Plunk, MD, MD  Overnight Events: Patient screaming out overnight during a code in another room. Otherwise no acute events.    Objective: Vital signs in last 24 hours: Temp:  [97.7 F (36.5 C)-98.8 F (37.1 C)] 97.7 F (36.5 C) (02/10 0556) Pulse Rate:  [87-110] 110  (02/10 0556) Resp:  [18-19] 19  (02/10 0556) BP: (135-158)/(69-95) 158/95 mmHg (02/10 0556) SpO2:  [96 %-100 %] 98 % (02/10 0808) Weight:  [174 lb 6.1 oz (79.1 kg)-177 lb 7.5 oz (80.5 kg)] 177 lb 7.5 oz (80.5 kg) (02/10 0556)  Wt Readings from Last 3 Encounters:  01/30/12 177 lb 7.5 oz (80.5 kg)  11/15/11 180 lb (81.647 kg)  10/27/11 177 lb (80.287 kg)     Current Facility-Administered Medications  Medication Dose Route Frequency Provider Last Rate Last Dose  . acetaminophen (TYLENOL) tablet 650 mg  650 mg Oral Q4H PRN Shelly Flatten, MD   650 mg at 01/30/12 0750  . albuterol (PROVENTIL) (5 MG/ML) 0.5% nebulizer solution 2.5 mg  2.5 mg Nebulization Q4H PRN Leighton Roach McDiarmid, MD      . busPIRone (BUSPAR) tablet 10 mg  10 mg Oral BID Edd Arbour, MD   10 mg at 01/30/12 0939  . chlorproMAZINE (THORAZINE) tablet 25 mg  25 mg Oral TID Edd Arbour, MD   25 mg at 01/30/12 0939  . cloNIDine (CATAPRES - Dosed in mg/24 hr) patch 0.2 mg  0.2 mg Transdermal Weekly Edd Arbour, MD   0.2 mg at 01/24/12 1532  . divalproex (DEPAKOTE ER) 24 hr tablet 750 mg  750 mg Oral BID Dessa Phi, MD   750 mg at 01/30/12 0939  . enoxaparin (LOVENOX) injection 40 mg  40 mg Subcutaneous Q24H Dusty Wagoner, MD   40 mg at 01/29/12 2132  . fluticasone (FLONASE) 50 MCG/ACT nasal spray 1 spray  1 spray Each Nare Daily Edd Arbour, MD   1 spray at 01/29/12 0950  . Fluticasone-Salmeterol (ADVAIR) 500-50 MCG/DOSE  inhaler 1 puff  1 puff Inhalation BID Dessa Phi, MD   1 puff at 01/30/12 0802  . gabapentin (NEURONTIN) capsule 900 mg  900 mg Oral TID Leighton Roach McDiarmid, MD   900 mg at 01/30/12 0939  . hydrOXYzine (ATARAX/VISTARIL) tablet 50 mg  50 mg Oral TID PRN Edd Arbour, MD   50 mg at 01/30/12 7829  . ibuprofen (ADVIL,MOTRIN) tablet 400 mg  400 mg Oral Q4H PRN Shelly Flatten, MD   400 mg at 01/29/12 1612  . insulin aspart (novoLOG) injection 0-15 Units  0-15 Units Subcutaneous TID WC Ivy Tye Savoy, MD   2 Units at 01/30/12 [phone]  . insulin aspart (novoLOG) injection 0-5 Units  0-5 Units Subcutaneous QHS Barnabas Lister, MD   2 Units at 01/26/12 2155  . loperamide (IMODIUM) capsule 2 mg  2 mg Oral PRN Shelly Flatten, MD   2 mg at 01/28/12 1053  . metFORMIN (GLUCOPHAGE) tablet 1,000 mg  1,000 mg Oral BID WC Ivy Tye Savoy, MD   1,000 mg at 01/30/12 0750  . mupirocin ointment (BACTROBAN) 2 %   Nasal BID Rodriques Badie, MD      . mupirocin ointment (BACTROBAN) 2 %   Topical PRN Marena Chancy, MD      . nitroGLYCERIN (  NITROGLYN) 2 % ointment 1 inch  1 inch Topical Once Dayton Bailiff, MD      . ondansetron [facility]) tablet 4 mg  4 mg Oral Q8H PRN Shelly Flatten, MD   4 mg at 01/29/12 1951  . pantoprazole (PROTONIX) EC tablet 40 mg  40 mg Oral Q1200 Aayansh Codispoti, MD   40 mg at 01/29/12 1227  . polyethylene glycol (MIRALAX / GLYCOLAX) packet 17 g  17 g Oral Daily Jaylan Hinojosa, MD   17 g at 01/27/12 1013  . potassium chloride SA (K-DUR,KLOR-CON) CR tablet 20 mEq  20 mEq Oral BID Shelly Flatten, MD   20 mEq at 01/30/12 0939  . tiotropium (SPIRIVA) inhalation capsule 18 mcg  18 mcg Inhalation Daily Dessa Phi, MD   18 mcg at 01/30/12 0803  . traZODone (DESYREL) tablet 150 mg  150 mg Oral QHS Marena Chancy, MD   150 mg at 01/29/12 2131  . venlafaxine (EFFEXOR-XR) 24 hr capsule 300 mg  300 mg Oral QHS Shelly Flatten, MD   300 mg at 01/29/12 2131   PE: Gen: NAD. Sitting in chair. Sitter present.  HEENT:  MMM  CV: RRR, 2+ pulses in all extremities  ZOX:WRUE, normal effort. 02 sats 100% on 1L [place], normal effort.  Ext/Musc: 1+ LE edema. No calf swelling. Non-painful to palpation. R orthotic boot in place.  Neuro: CN grossly intact  Psych: Pt alert and oriented x3. Mentation better than previously. Non-labile. No crying   Labs/Studies:  Basic Metabolic Panel:    Component Value Date/Time   NA 129* 01/30/2012 0650   K 4.6 01/30/2012 0650   CL 94* 01/30/2012 0650   CO2 22 01/30/2012 0650   BUN 9 01/30/2012 0650   CREATININE 0.44* 01/30/2012 0650   CREATININE 0.73 03/24/2011 1614   GLUCOSE 157* 01/30/2012 0650   CALCIUM 9.0 01/30/2012 0650   Lab Results  Component Value Date   WBC 4.6 01/29/2012   HGB 11.8* 01/29/2012   HCT 36.3 01/29/2012   MCV 92.8 01/29/2012   PLT 189 01/29/2012   Assessment/Plan:  [name] is a 71 y.o. F w/ improved bilateral LE edema and improving sedation after weaning from benzodiazepine and opioids.   1. LE edema: Likely multifactorial from worsening chronic venous insufficiency and mild diastolic congestive heart failure. Dopplers negative for DVT. Was on torsemide 80mg  bid, with an increase in bicarb. Home dose is torsemide 20mg  in morning and 10mg  at night.  -Weight up 3 lbs from yesterday.  - Will restart home dose torsemide 20 q AM and 10 q PM as weight is trending up.  - continue strict ins and outs and daily weights.  - compression hose - PT/OT  - ambulate pt.   2. CV: Previous h/o diastolic HF. Possibly contributing to LE edema. troponins negative x3. BNP 516 on admission. Echo 60-75%  - restarting torsemide at home dose.   3. Hyponatremia: Hypervolemic hyponatremia.  -Restart torsemide 20 mg q AM  for diuresis, may add additional prn dose depending on response.   -F/u BMET in the AM.  3. Polypharmacy: On admission patient taking multiple medications for pain anxiety, bipolar depression, insomnia and chronic pain. Psych assisted w/ wean process. All opiods  and Xanax have been DCd. Buspar was started yesterday. On depakote and neurontin still, this will provide some seizure coverage. Effexor started again yesterday. Reviewed home dose of Trazadone and confirmed 150mg  prior to admission.  - Thorazine 25mg  TID  - Effexor 300mg  Qhs  -  Continue Buspar 10mg  BID  - continue w/ trazadone 150mg   - Vistaril 50mg  TID - monitor sedation  -Therapeuticsoothing  and Haldol prn anxiety.   4. COPD: Doing well on 2L [place]. Home O2 of 2-3L. 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 Will monitor.  - continue O2 as needed.  - continue spiriva   5. R hand wound: Wound dressing CDI.  - Continue current wound care BID per wound care team   6. DM: CBG continues to be elevated, w/ some improvement since starting metformin. Will consider Lantus if no further improvement w/ Metformin  - SSI  - metformin   7. FEN/GI: Tolerating carb modified diet. Hyponatremia as above.  - Contiue carb modified diet  - Daily CBGs.  - Kdur BID   8. Prophylaxis: Lovenox 40 mg [place] q daily.   9. Disposition: SNF placement by CSW. Sitter discontinued in anticipation of placement.     SignedDessa Phi, MD Family Medicine Resident PGY-1 01/30/2012 11:25 AM

## 2012-01-30 NOTE — Progress Notes (Signed)
FMTS Attending Note  Patient seen and examined by me this morning, I discussed her care with the resident team and I agree with the note above.  Ms. Vokes continues to be disturbed by the radical change in her medications that we have instituted on this hospital stay.  She has been more lucid and collected (although continues to be anxious) in the past several days.  She was changed from 4700 unit to 5100 last night, and is a challenging personality for those who do not know her.  She is insistent that her trazodone be increased to 300mg  nightly for sleep.  She is taking 150mg /noc, which is the only dose that has been recorded in available records.  She indicates that her daughter had given her 300/noc at home before going to [facility], where she says her "meds were screwed up."  May consider slight titration up on her trazodone at bedtime if this will indeed make her feel better and not oversedate her.  Plans for dispo clarification tomorrow (Monday).  Paula Compton, MD

## 2012-01-31 LAB — BASIC METABOLIC PANEL
BUN: 10 mg/dL (ref 6–23)
Calcium: 9.6 mg/dL (ref 8.4–10.5)
Creatinine, Ser: 0.46 mg/dL — ABNORMAL LOW (ref 0.50–1.10)
GFR calc Af Amer: >90 mL/min (ref >90)
GFR calc non Af Amer: >90 mL/min (ref >90)
Potassium: 4.4 mEq/L (ref 3.5–5.1)

## 2012-01-31 LAB — GLUCOSE, CAPILLARY
Glucose-Capillary: 135 mg/dL — ABNORMAL HIGH (ref 70–99)
Glucose-Capillary: 123 mg/dL — ABNORMAL HIGH (ref 70–99)

## 2012-01-31 MED ORDER — TRAZODONE HCL 50 MG PO TABS: 50.0000 mg | ORAL_TABLET | Freq: Every day | ORAL | Status: DC

## 2012-01-31 MED ORDER — IBUPROFEN 400 MG PO TABS: 400.0000 mg | ORAL_TABLET | Freq: Four times a day (QID) | ORAL | Status: AC | PRN

## 2012-01-31 MED ORDER — QUETIAPINE FUMARATE 25 MG PO TABS
25.0000 mg | ORAL_TABLET | Freq: Every day | ORAL | Status: DC
  Filled 2012-01-31: qty 1

## 2012-01-31 MED ORDER — BUSPIRONE HCL 10 MG PO TABS: 10.0000 mg | ORAL_TABLET | Freq: Two times a day (BID) | ORAL | Status: DC

## 2012-01-31 MED ORDER — LOPERAMIDE HCL 2 MG PO CAPS: 2.0000 mg | ORAL_CAPSULE | ORAL | Status: AC | PRN

## 2012-01-31 MED ORDER — QUETIAPINE FUMARATE 25 MG PO TABS: 25.0000 mg | ORAL_TABLET | Freq: Every day | ORAL | Status: DC

## 2012-01-31 MED ORDER — HYDROXYZINE HCL 50 MG PO TABS: 50.0000 mg | ORAL_TABLET | Freq: Four times a day (QID) | ORAL | Status: AC | PRN

## 2012-01-31 MED ORDER — ACETAMINOPHEN 325 MG PO TABS: 650.0000 mg | ORAL_TABLET | Freq: Four times a day (QID) | ORAL | Status: AC | PRN

## 2012-01-31 MED ORDER — GABAPENTIN 300 MG PO CAPS: 900.0000 mg | ORAL_CAPSULE | Freq: Three times a day (TID) | ORAL | Status: DC

## 2012-01-31 MED ORDER — TRAZODONE HCL 50 MG PO TABS
50.0000 mg | ORAL_TABLET | Freq: Every day | ORAL | Status: DC
  Filled 2012-01-31: qty 1

## 2012-01-31 MED ORDER — TORSEMIDE 10 MG PO TABS: 20.0000 mg | ORAL_TABLET | Freq: Two times a day (BID) | ORAL | Status: DC | PRN

## 2012-01-31 MED ORDER — CHLORPROMAZINE HCL 25 MG PO TABS: 25.0000 mg | ORAL_TABLET | Freq: Three times a day (TID) | ORAL | Status: DC

## 2012-01-31 MED ORDER — VENLAFAXINE HCL ER 150 MG PO CP24: 300.0000 mg | ORAL_CAPSULE | Freq: Every day | ORAL | Status: DC

## 2012-01-31 NOTE — Progress Notes (Signed)
OT Cancellation Note  Treatment cancelled today due to patient's refusal to participate due to expected transfer to SNF today. [name],[name].01/31/2012, 3:12 PM [facility], OTR/L  [phone] 01/31/2012

## 2012-01-31 NOTE — Progress Notes (Signed)
Physical Therapy Treatment Patient Details Name: [name] MRN: [id number] DOB: 02-Jan-1941 Today's Date: 01/31/2012  PT Assessment/Plan  PT - Assessment/Plan Comments on Treatment Session: Patient really eager to ambulate however still perservation on pain meds and discharge/  PT Plan: Discharge plan remains appropriate PT Frequency: Min 3X/week Follow Up Recommendations: Skilled nursing facility Equipment Recommended: Defer to next venue PT Goals  Acute Rehab PT Goals PT Goal: Sit to Stand - Progress: Progressing toward goal PT Transfer Goal: Bed to Chair/Chair to Bed - Progress: Progressing toward goal PT Goal: Ambulate - Progress: Progressing toward goal  PT Treatment Precautions/Restrictions  Precautions Precautions: Fall Required Braces or Orthoses: Yes Other Brace/Splint: CAM walker on RLE Restrictions Weight Bearing Restrictions: Yes Other Position/Activity Restrictions: Evidently needs cam boot on with ambulation Mobility (including Balance) Bed Mobility Bed Mobility: No Transfers Sit to Stand: 3: Mod assist Sit to Stand Details (indicate cue type and reason): A to initiate stand and balance as patient unable to shift weight anteriorly to get over BOS. Cues for safe hand placement Stand to Sit: 4: Min assist Stand to Sit Details: A to control descent into chair.  Ambulation/Gait Ambulation/Gait Assistance: 4: Min assist Ambulation/Gait Assistance Details (indicate cue type and reason): Cues for safe positioning of RW Ambulation Distance (Feet): 210 Feet Assistive device: Rolling walker Gait Pattern: Step-to pattern;Trunk flexed    Exercise  General Exercises - Lower Extremity Ankle Circles/Pumps: AROM;10 reps Long Arc Quad: AROM;10 reps End of Session PT - End of Session Equipment Utilized During Treatment: Gait belt Activity Tolerance: Patient tolerated treatment well Patient left: in chair Nurse Communication: Mobility status for transfers;Mobility  status for ambulation General Behavior During Session: Restless Cognition: WFL for tasks performed  Fredrich Birks 01/31/2012, 12:48 PM 01/31/2012 Fredrich Birks PTA [phone] pager [phone] office

## 2012-01-31 NOTE — Progress Notes (Addendum)
Clinical Child psychotherapist (CSW) informed today that [facility] is able to offer short term placement for pt. CSW contacted pt daughter [name] to inform her. She stated she would speak with her mother and contact CSW back, however is agreeable and would like her mother to accept bed offer. CSW will speak with pt and provide bed offer, CSW will also speak with MD to determine when pt will be medically ready for dc.  Theresia Bough, MSW, Theresia Majors [phone]

## 2012-01-31 NOTE — Progress Notes (Signed)
  Subjective:    Patient ID: [name], female    DOB: 06/28/1941, 71 y.o.   MRN: [id number]  Shortness of Breath  Anxiety Symptoms include shortness of breath.     Seen and examined.  Discussed with Dr. Konrad Dolores.  I agree with DC today to SNF provided bed is available.  She is as medically stable as she is going to be.  She seems at peace with this plan despite her chronic anxiety and other psych issues.     Review of Systems  Respiratory: Positive for shortness of breath.        Objective:   Physical Exam        Assessment & Plan:

## 2012-01-31 NOTE — Progress Notes (Signed)
Brief Nutrition Note:  Chart reviewed and patient's status noted.  Plans for discharge to SNF today.    RD received page from [facility].  Patient has been asking for more food and going over her therapeutic diet restrictions (Carb Modified, 2 gm Na, 1500 ml fluid restriction).  Meal completions of 75-100% most meals recorded, which is adequate to meet estimated nutrition needs.  No nutrition intervention at this time as patient will not need to order any additional meals during this admission.  Sanjuan Dame, [employer] Pager [phone]

## 2012-01-31 NOTE — Progress Notes (Signed)
Family Medicine Teaching Service [facility] Progress Note  Patient name: [name] Medical record number: [id number] Date of birth: October 19, 1941 Age: 71 y.o. Gender: female    LOS: 11 days   Primary Care Provider: Ellery Plunk, MD, MD  Overnight Events:  Pt reports only sleeping 30 minutes last night. Feels well this morning. Pt seen just after ambulating w/ PT. W/o LE pain this morning.   Denies nausea, vomiting, diarrhea, HA, CP, SOB, fever, rash.   Objective: Vital signs in last 24 hours: Temp:  [98.1 F (36.7 C)-98.8 F (37.1 C)] 98.3 F (36.8 C) (02/11 0513) Pulse Rate:  [94-101] 99  (02/11 0513) Resp:  [18-20] 18  (02/11 0513) BP: (133-142)/(67-73) 142/73 mmHg (02/11 0513) SpO2:  [99 %-100 %] 99 % (02/11 0513) Weight:  [175 lb 3.2 oz (79.47 kg)] 175 lb 3.2 oz (79.47 kg) (02/11 0513)  Wt Readings from Last 3 Encounters:  01/31/12 175 lb 3.2 oz (79.47 kg)  11/15/11 180 lb (81.647 kg)  10/27/11 177 lb (80.287 kg)     Current Facility-Administered Medications  Medication Dose Route Frequency Provider Last Rate Last Dose  . acetaminophen (TYLENOL) tablet 650 mg  650 mg Oral Q4H PRN Kristen Flatten, MD   650 mg at 01/31/12 0645  . albuterol (PROVENTIL) (5 MG/ML) 0.5% nebulizer solution 2.5 mg  2.5 mg Nebulization Q4H PRN Kristen Roach McDiarmid, MD      . busPIRone (BUSPAR) tablet 10 mg  10 mg Oral BID Kristen Arbour, MD   10 mg at 01/30/12 2238  . chlorproMAZINE (THORAZINE) tablet 25 mg  25 mg Oral TID Kristen Arbour, MD   25 mg at 01/30/12 2239  . cloNIDine (CATAPRES - Dosed in mg/24 hr) patch 0.2 mg  0.2 mg Transdermal Weekly Kristen Arbour, MD   0.2 mg at 01/24/12 1532  . divalproex (DEPAKOTE ER) 24 hr tablet 750 mg  750 mg Oral BID Kristen Phi, MD   750 mg at 01/30/12 2239  . enoxaparin (LOVENOX) injection 40 mg  40 mg Subcutaneous Q24H Kristen Funches, MD   40 mg at 01/30/12 2241  . fluticasone (FLONASE) 50 MCG/ACT nasal spray 1 spray  1 spray Each Nare Daily Kristen Arbour, MD   1 spray at 01/30/12 1246  . Fluticasone-Salmeterol (ADVAIR) 500-50 MCG/DOSE inhaler 1 puff  1 puff Inhalation BID Kristen Phi, MD   1 puff at 01/31/12 0837  . gabapentin (NEURONTIN) capsule 900 mg  900 mg Oral TID Kristen Roach McDiarmid, MD   900 mg at 01/30/12 2240  . hydrOXYzine (ATARAX/VISTARIL) tablet 50 mg  50 mg Oral TID PRN Kristen Arbour, MD   50 mg at 01/31/12 0600  . ibuprofen (ADVIL,MOTRIN) tablet 400 mg  400 mg Oral Q4H PRN Kristen Flatten, MD   400 mg at 01/30/12 1825  . insulin aspart (novoLOG) injection 0-15 Units  0-15 Units Subcutaneous TID WC Kristen Tye Savoy, MD   2 Units at 01/31/12 [phone]  . insulin aspart (novoLOG) injection 0-5 Units  0-5 Units Subcutaneous QHS Kristen Lister, MD   2 Units at 01/26/12 2155  . loperamide (IMODIUM) capsule 2 mg  2 mg Oral PRN Kristen Flatten, MD   2 mg at 01/30/12 1720  . metFORMIN (GLUCOPHAGE) tablet 1,000 mg  1,000 mg Oral BID WC Kristen Tye Savoy, MD   1,000 mg at 01/31/12 0820  . mupirocin ointment (BACTROBAN) 2 %   Nasal BID Kristen Phi, MD      . mupirocin ointment (BACTROBAN)  2 %   Topical PRN Kristen Chancy, MD      . nitroGLYCERIN (NITROGLYN) 2 % ointment 1 inch  1 inch Topical Once Kristen Bailiff, MD      . ondansetron [facility]) tablet 4 mg  4 mg Oral Q8H PRN Kristen Flatten, MD   4 mg at 01/29/12 1951  . pantoprazole (PROTONIX) EC tablet 40 mg  40 mg Oral Q1200 Kristen Funches, MD   40 mg at 01/30/12 1247  . polyethylene glycol (MIRALAX / GLYCOLAX) packet 17 g  17 g Oral Daily Kristen Funches, MD   17 g at 01/27/12 1013  . potassium chloride SA (K-DUR,KLOR-CON) CR tablet 20 mEq  20 mEq Oral BID Kristen Flatten, MD   20 mEq at 01/30/12 2240  . tiotropium (SPIRIVA) inhalation capsule 18 mcg  18 mcg Inhalation Daily Kristen Phi, MD   18 mcg at 01/31/12 0840  . torsemide (DEMADEX) tablet 20 mg  20 mg Oral q morning - 10a Kristen Funches, MD   20 mg at 01/30/12 1247  . traZODone (DESYREL) tablet 200 mg  200 mg Oral QHS Kristen Hairford, MD    200 mg at 01/30/12 2240  . venlafaxine (EFFEXOR-XR) 24 hr capsule 300 mg  300 mg Oral QHS Kristen Flatten, MD   300 mg at 01/31/12 0039  . DISCONTD: traZODone (DESYREL) tablet 150 mg  150 mg Oral QHS Kristen Chancy, MD   150 mg at 01/29/12 2131     PE: Gen: NAD, smiling HEENT: MMM  CV: RRR, 2+ pulses in all extremities  WUJ:WJXB, normal effort. 02 sats 100% on 1L [place], normal effort.  Ext/Musc: 1+ LE edema. No calf swelling. Non-painful to palpation. R orthotic boot in place.  Neuro: CN grossly intact  Psych: Pt alert and oriented x3. Mentation better than previously. Non-labile. No crying   Labs/Studies:  CBG (last 3)   Basename 01/31/12 0805 01/30/12 2217 01/30/12 1646  GLUCAP 135* 134* 139*    Basic Metabolic Panel:    Component Value Date/Time   NA 130* 01/31/2012 0655   K 4.4 01/31/2012 0655   CL 92* 01/31/2012 0655   CO2 24 01/31/2012 0655   BUN 10 01/31/2012 0655   CREATININE 0.46* 01/31/2012 0655   CREATININE 0.73 03/24/2011 1614   GLUCOSE 143* 01/31/2012 0655   CALCIUM 9.6 01/31/2012 0655     Assessment/Plan: [name] is a 71 y.o. F w/ improved bilateral LE edema and improving sedation after weaning from benzodiazepine and opioids.   1. LE edema: Likely multifactorial from worsening chronic venous insufficiency and mild diastolic congestive heart failure. Dopplers negative for DVT.  Net down 7.2L since admission (was down 10L previously during admission). Received one dose of torsemide 20mg  yesterday.   - Hold torsemide due to hyponatremia - elevate legs.  - continue strict ins and outs and daily weights.  - compression hose  - PT/OT  - ambulate pt.   2. CV: Previous h/o diastolic HF. Possibly contributing to LE edema. troponins negative x3 BNP 516 on admission. Echo 60-75%. Mild grade 1 diastolic heart failure - Will consider restarting torsemide later   3. Hyponatremia/: Torsemide x1 yesterday. Improved today to 130 today. No fluids due to LE edema.   3.  Polypharmacy: On admission patient taking multiple medications for pain anxiety, bipolar depression, insomnia and chronic pain. Psych assisted w/ wean process. All opiods and Xanax have been DCd. Buspar was started yesterday. On depakote and neurontin still, this will provide some seizure coverage. Effexor  started again. Discussed sleep w/ pharmacy and agree to taper down and eventually DC Trazadone and place on seroquel 25mg  QHS.  - Thorazine 25mg  TID  - Effexor 300mg  Qhs  - Continue Buspar 10mg  BID  - continue w/ trazadone 150mg   - Vistaril 50mg  TID - seroquel 25mg  QHS  - monitor sedation   4. COPD: Doing well on 2L [facility]. Home O2 of 2-3L. 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 Will monitor.  - Continue O2 as needed.  - continue spiriva   5. R hand wound: Wound dressing CDI.  - Continue current wound care BID per wound care team   6. DM: CBG continues to be elevated, w/ some improvement since starting metformin. Will consider Lantus if no further improvement w/ Metformin  - SSI  - metformin   7. FEN/GI: Tolerating carb modified diet. Hyponatremia as above. Hyponatremia as above. Hyperkalemia likely from hemolysis  - Contiue carb modified diet  - Daily CBGs.  - Kdur BID   8. Prophylaxis: Lovenox 40 mg [place] q daily.   9. Disposition: SNF pending work-up and placement by SW (family declining return to [facility]). Likely DC today/tomorrow    Signed: Shelly Flatten, MD Family Medicine Resident PGY-1 01/31/2012 9:09 AM

## 2012-01-31 NOTE — Progress Notes (Signed)
Clinical Child psychotherapist (CSW) faxed pt dc summary to [facility]. CSW placed pt dc packet in [place], confirmed with daughter [name] and pt okay to dc to [facility] today. CSW has contacted [facility] for transportation and CSW is signing off.   Theresia Bough, MSW, Theresia Majors [phone]

## 2012-01-31 NOTE — Progress Notes (Signed)
Discharged to [facility] transported by [place]. Daughter in tp pick up patients belonging. Patient  Is alert snd oriented X 3, not in any distress.

## 2012-01-31 NOTE — Progress Notes (Signed)
Clinical Child psychotherapist (CSW) visited pt room to inform pt that she would be medically ready to go. Pt stated she did not want to leave today and was not informed by MD that she would be ready to go. CSW informed pt that if MD states she is stable we will need to proceed with dc. Pt stated she is agreeable to [facility] skilled nursing facility however would like to wait for tomorrow. CSW informed pt that pt will not be able to stay another day. Pt asked for CSW to speak to her sister Velna Hatchet. CSW spoke to [place] and informed her that pt was given a form from CM on Friday to appeal with Medicare and pt did not appeal. CSW informed Velna Hatchet that if pt does not dc today we will have to have other administrators involved as pt will be ready today. Velna Hatchet stated she understood and would speak to pt. CSW contact pt daughter who stated she is agreeable, will speak to pt and assist with getting pt things to the facility. CSW has paged MD as pt would like to hear from MD that she will dc today.  Theresia Bough, MSW, Theresia Majors [phone]

## 2012-02-01 NOTE — Progress Notes (Signed)
Late entry.   This CM met with pt on Friday, 01/28/2012 pm and presented IM letter, explaining in detail the pt option to Appeal d/c, if she felt that she was unable to d/c to SNF. Pt was very agitated and declined to sign the form, however the form was left with the pt and a very thorough explanation was given to the pt re her options.  Pt was also very agitated with staff and chose to dismiss this CM and discussion of options.  Johny Shock RN MPH Case Manager [phone]

## 2012-02-04 ENCOUNTER — Telehealth: Payer: Self-pay | Admitting: Family Medicine

## 2012-02-04 NOTE — Telephone Encounter (Signed)
Pt should go through her doctor at the nursing home for this.  That doctor should be doing the majority of her management while she is there.

## 2012-02-04 NOTE — Telephone Encounter (Signed)
Daughter is calling trying to get her mom in to see a Dr. Laroy Apple, Dr.JG. [facility]/ [facility] - fax # [phone].  They just need a fax of what her current medical problems are and what she needs to see a Solicitor.  She has an appt on Monday.

## 2012-02-09 ENCOUNTER — Telehealth: Payer: Self-pay | Admitting: Family Medicine

## 2012-02-09 NOTE — Telephone Encounter (Signed)
Want to have referrals put in to [facility] and [facility].  Have to return to see them both.  Call daughter when referrals have been completed.

## 2012-02-21 ENCOUNTER — Ambulatory Visit (INDEPENDENT_AMBULATORY_CARE_PROVIDER_SITE_OTHER): Payer: Medicare Other | Admitting: Family Medicine

## 2012-02-21 ENCOUNTER — Encounter: Payer: Self-pay | Admitting: Family Medicine

## 2012-02-21 ENCOUNTER — Telehealth: Payer: Self-pay | Admitting: Family Medicine

## 2012-02-21 VITALS — BP 121/74 | HR 92 | Ht 62.0 in | Wt 178.0 lb

## 2012-02-21 DIAGNOSIS — F319 Bipolar disorder, unspecified: Secondary | ICD-10-CM

## 2012-02-21 DIAGNOSIS — J441 Chronic obstructive pulmonary disease with (acute) exacerbation: Secondary | ICD-10-CM

## 2012-02-21 MED ORDER — LEVALBUTEROL TARTRATE 45 MCG/ACT IN AERO: 2.0000 | INHALATION_SPRAY | RESPIRATORY_TRACT | Status: DC | PRN

## 2012-02-21 MED ORDER — PREDNISONE 50 MG PO TABS: 50.0000 mg | ORAL_TABLET | Freq: Every day | ORAL | Status: DC

## 2012-02-21 MED ORDER — DOXYCYCLINE HYCLATE 100 MG PO TABS: 100.0000 mg | ORAL_TABLET | Freq: Two times a day (BID) | ORAL | Status: AC

## 2012-02-21 NOTE — Progress Notes (Signed)
  Subjective:    Patient ID: [name], female    DOB: June 18, 1941, 71 y.o.   MRN: [id number]  HPI Pt here today for 1 week of increased cough, increased sputum, change in sputum color.  She is on her regular 2L O2.  She denies fevers, CP.  She is taking advair but does not have xopenex at the NH.    Bipolar- pt's mood is much better on thorizine and xanax.  She is not crying out or having issues in the NH.     Review of Systems Denies CP, SOB, HA, N/V/D, fever     Objective:   Physical Exam  Vital signs reviewed General appearance - alert, well appearing, and in no distress and oriented to person, place, and time Heart - normal rate, regular rhythm, normal S1, S2, no murmurs, rubs, clicks or gallops Lungs- coarse throughout.  No wheeze, no focal findings Abdomen - soft, nontender, nondistended, no masses or organomegaly Extremities - peripheral pulses normal, 2 plus symmetric LE edema       Assessment & Plan:

## 2012-02-21 NOTE — Patient Instructions (Signed)
I am glad you're doing so well Please take the doxycycline twice a day for 7 days Take the prednisone if you do not get better and your breathing worsens Use the albuterol as needed for wheezing Make sure your nursing home doctor sees you at the end of the week\   You need to wear your compression hose

## 2012-02-21 NOTE — Assessment & Plan Note (Signed)
Pt is not wheezing on exam today but is coarse.  She is on her usual 2L.  She has increased cough, increased sputum and change in sputum.  Will treat with doxy, gave script for prednisone but pt would like to wait and see if she improves without it.

## 2012-02-21 NOTE — Telephone Encounter (Signed)
The doctor from the rehab center is asking for OV notes from today pls fax to: [phone]

## 2012-02-21 NOTE — Assessment & Plan Note (Signed)
Pt doing well on thorizine and xanax.  Her behaviour is well controlled and she is doing well at the nursing home.

## 2012-02-22 IMAGING — CR DG CHEST 2V
2 series · 2 of 2 positions shown · non-contrast
Comparison: 09/29/2011

CLINICAL DATA: SOB.  Leg swelling and anxiety.

CHEST - 2 VIEW

[w chest lat]
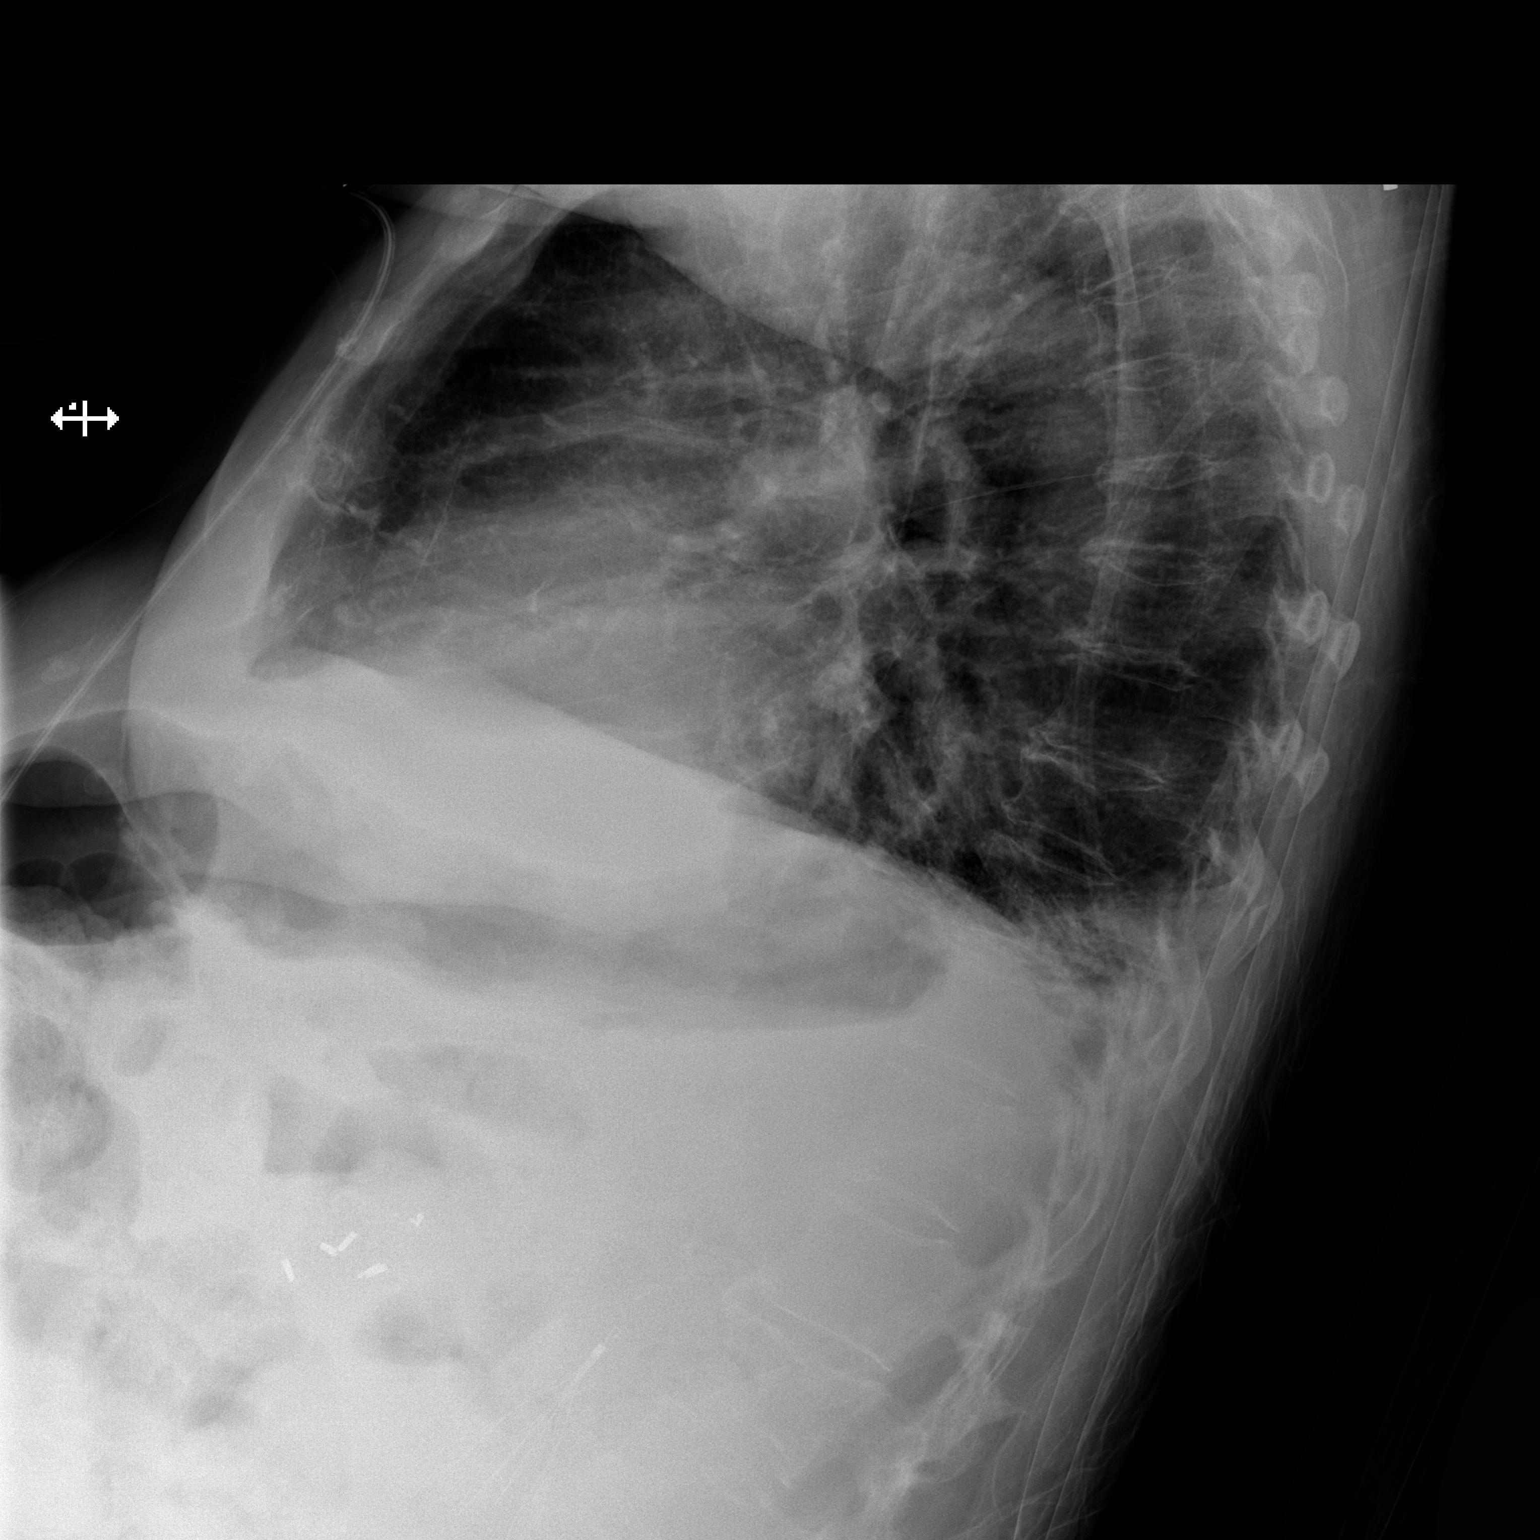

[x chest ap]
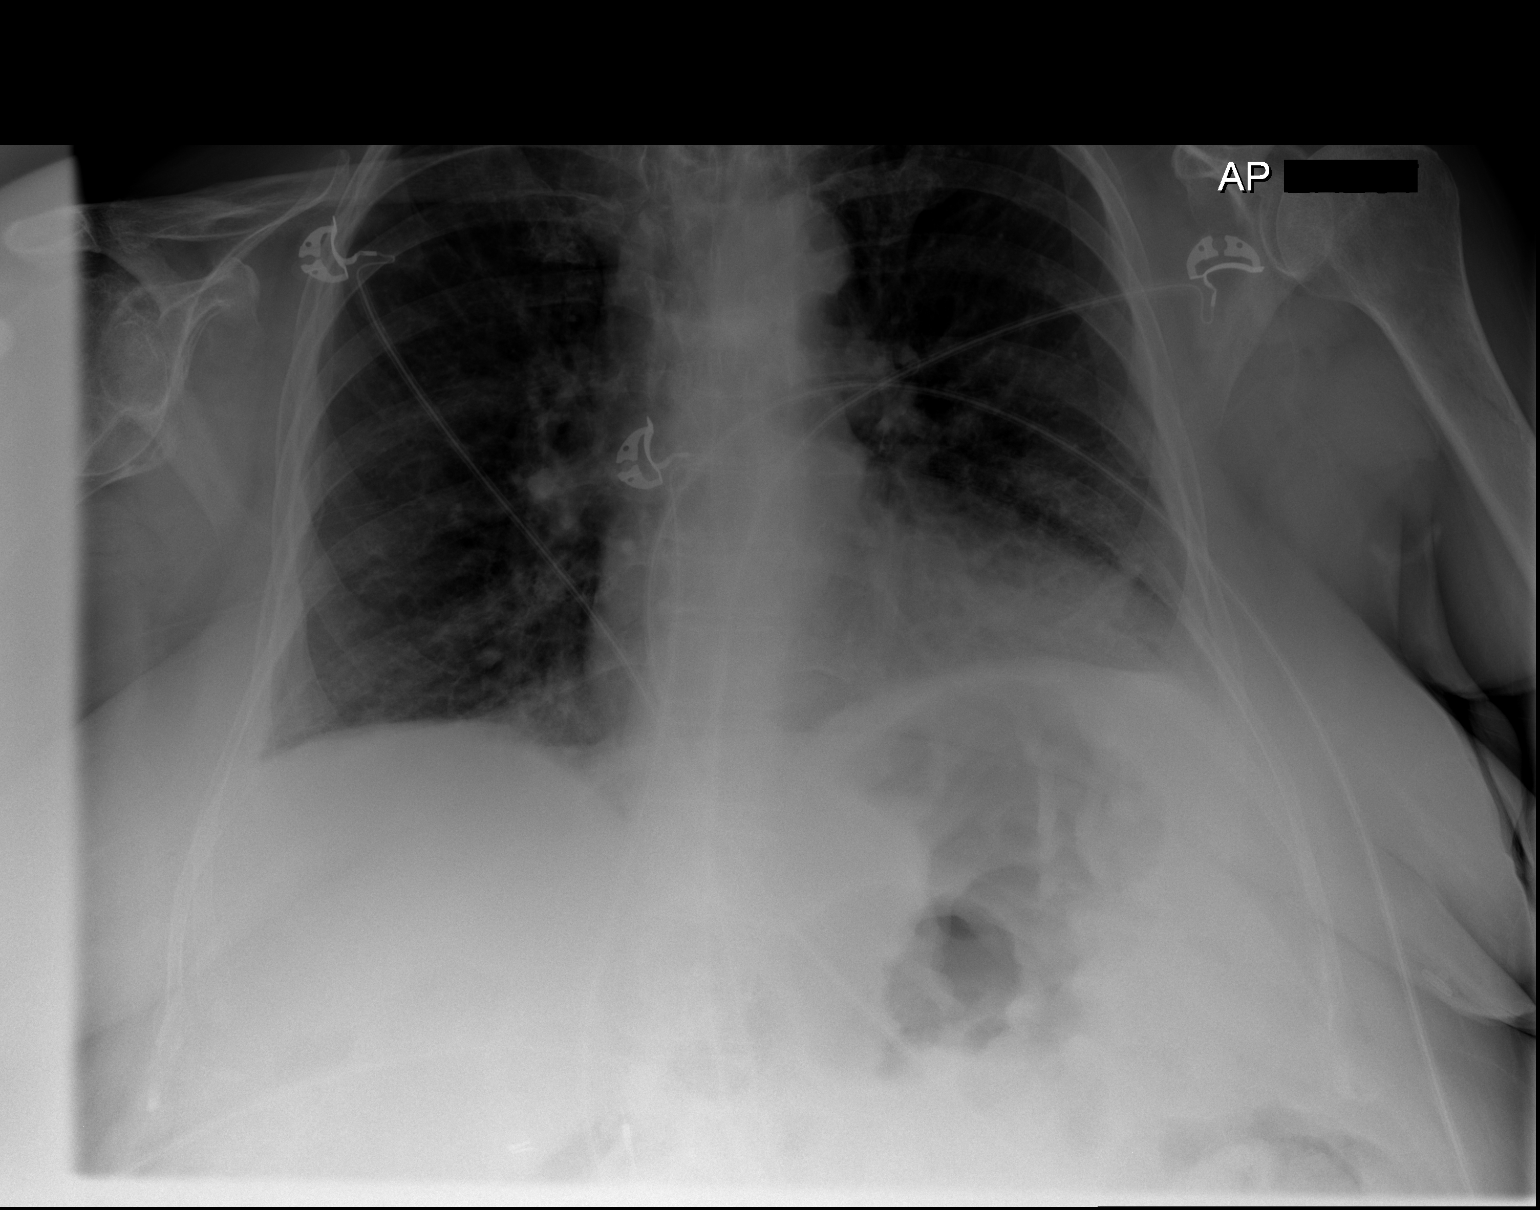

[2 of 2 positions shown; findings below may reference images not displayed]

FINDINGS: Heart size is normal.

There are small bilateral pleural effusions and pulmonary venous
congestion.

Atelectasis is noted in the left base.

Old right posterior lateral rib fracture deformity noted.
IMPRESSION: 1.  Suspect mild CHF.
2.  Left base atelectasis.

## 2012-02-22 NOTE — Telephone Encounter (Signed)
Notes faxed.

## 2012-03-08 ENCOUNTER — Telehealth: Payer: Self-pay | Admitting: *Deleted

## 2012-03-08 NOTE — Telephone Encounter (Signed)
Spoke with pt's daughter to see if pt can come in earlier due to the type of problems that she is having, and in case she has to have any testing done for this. She is not able to bring her any sooner. Told that if the pt is having any difficulty breathing or anything gets worse to take pt to ED to be seen.Dr. Earnest Bailey informed of this. Pt to come in at scheduled time.Laureen Ochs, Viann Shove

## 2012-03-09 ENCOUNTER — Encounter: Payer: Self-pay | Admitting: Family Medicine

## 2012-03-09 ENCOUNTER — Ambulatory Visit (INDEPENDENT_AMBULATORY_CARE_PROVIDER_SITE_OTHER): Payer: Medicare Other | Admitting: Family Medicine

## 2012-03-09 VITALS — BP 145/85 | HR 100 | Temp 98.0°F | Ht 62.0 in | Wt 185.0 lb

## 2012-03-09 DIAGNOSIS — I872 Venous insufficiency (chronic) (peripheral): Secondary | ICD-10-CM

## 2012-03-09 NOTE — Assessment & Plan Note (Signed)
Placed bilateral Unna boots today- hope to reduce pain and skin breakdown- will follow-up in 1 week with PCP for removal.  Patient had previously had knee high compressions stockings difficult to tolerate as band would roll down at the knee and swelling would be present above the knee.  Gave Rx for Grade 2 (20-30 mm) thigh high compression stocking- discussed with patient grade 3 may be more appropriate but daughter feels it may not be tolerable to patient and is willing to buy another pair of increased compression if needed.  Will wait to get measured until unna boots off and LE edema improved.

## 2012-03-09 NOTE — Patient Instructions (Signed)
Follow-up Next Wednesday with Dr. Hulen Luster  Prescription for compression stockings

## 2012-03-09 NOTE — Progress Notes (Signed)
  Subjective:    Patient ID: [name], female    DOB: 08-07-1941, 70 y.o.   MRN: [id number]  HPI  71 yo here for evaluation of chronic LE edema  Has been worsening in past 4-6 weeks.  Notes was hospitalized end of January and has been in a nursing home for physical therapy.  Increasing time upright has worsened LE edema.  Now right leg becoming painful and skin tight.  No calf pain, redness, pus  Review of Systemssee HPI    Objective:   Physical Exam GEN: Alert & Oriented, No acute distress CV:  Regular Rate & Rhythm, no murmur Respiratory:  Normal work of breathing, CTAB Ext: Bilateral LE pitting edema Right 3+, left 2+ up to knees Right LE tight, skin shiny, small area on lateral anterior shin skin appears thin and fragile.  Tender bilaterally.  No erythema.        Assessment & Plan:

## 2012-03-10 ENCOUNTER — Telehealth: Payer: Self-pay | Admitting: Family Medicine

## 2012-03-10 NOTE — Telephone Encounter (Signed)
[facility] need faxed to them statement to wrap patients legs for reduction of swelling at their facility to avoid skin tear.  Their fax number is [phone].

## 2012-03-12 NOTE — Telephone Encounter (Signed)
Pt to return one week from last visit to have unna boot removed and then will be measured for compression stockings

## 2012-03-13 NOTE — Telephone Encounter (Signed)
Attempted to call daughter, the only number in the chart is her work number.  But when I called the lady who answered the phone states that the daughter was not there today.  Will await callback.  Please let her know message from Dr. Hulen Luster when she calls back. [name], Maryjo Rochester

## 2012-03-14 ENCOUNTER — Telehealth: Payer: Self-pay | Admitting: Family Medicine

## 2012-03-14 NOTE — Telephone Encounter (Signed)
Order to have measurement of compression stockings given to facility.  Please fax or call verbal order in.

## 2012-03-14 NOTE — Telephone Encounter (Signed)
Ok for verbal order to measure for stockings.

## 2012-03-14 NOTE — Telephone Encounter (Signed)
received fax from melody Delford Field (daughter).  Asked for order to have unna boot removed at nursing home.  Will fax back ok.

## 2012-04-01 ENCOUNTER — Encounter (HOSPITAL_COMMUNITY): Payer: Self-pay

## 2012-04-01 ENCOUNTER — Emergency Department (HOSPITAL_COMMUNITY): Payer: Medicare Other

## 2012-04-01 ENCOUNTER — Inpatient Hospital Stay (HOSPITAL_COMMUNITY)
Admission: EM | Admit: 2012-04-01 | Discharge: 2012-04-04 | DRG: 293 | Disposition: A | Discharge status: Nursing Home (Includes ICF) | Payer: Medicare Other | Attending: Family Medicine | Admitting: Family Medicine

## 2012-04-01 DIAGNOSIS — I509 Heart failure, unspecified: Principal | ICD-10-CM | POA: Diagnosis present

## 2012-04-01 DIAGNOSIS — I279 Pulmonary heart disease, unspecified: Secondary | ICD-10-CM | POA: Diagnosis present

## 2012-04-01 DIAGNOSIS — J439 Emphysema, unspecified: Secondary | ICD-10-CM | POA: Diagnosis present

## 2012-04-01 DIAGNOSIS — J4489 Other specified chronic obstructive pulmonary disease: Secondary | ICD-10-CM | POA: Diagnosis present

## 2012-04-01 DIAGNOSIS — Z86718 Personal history of other venous thrombosis and embolism: Secondary | ICD-10-CM

## 2012-04-01 DIAGNOSIS — K219 Gastro-esophageal reflux disease without esophagitis: Secondary | ICD-10-CM | POA: Diagnosis present

## 2012-04-01 DIAGNOSIS — I872 Venous insufficiency (chronic) (peripheral): Secondary | ICD-10-CM | POA: Diagnosis present

## 2012-04-01 DIAGNOSIS — F418 Other specified anxiety disorders: Secondary | ICD-10-CM | POA: Diagnosis present

## 2012-04-01 DIAGNOSIS — Z993 Dependence on wheelchair: Secondary | ICD-10-CM

## 2012-04-01 DIAGNOSIS — Z8541 Personal history of malignant neoplasm of cervix uteri: Secondary | ICD-10-CM

## 2012-04-01 DIAGNOSIS — E785 Hyperlipidemia, unspecified: Secondary | ICD-10-CM | POA: Diagnosis present

## 2012-04-01 DIAGNOSIS — J449 Chronic obstructive pulmonary disease, unspecified: Secondary | ICD-10-CM | POA: Diagnosis present

## 2012-04-01 DIAGNOSIS — Z9849 Cataract extraction status, unspecified eye: Secondary | ICD-10-CM

## 2012-04-01 DIAGNOSIS — Z86711 Personal history of pulmonary embolism: Secondary | ICD-10-CM

## 2012-04-01 DIAGNOSIS — Z79899 Other long term (current) drug therapy: Secondary | ICD-10-CM

## 2012-04-01 DIAGNOSIS — F411 Generalized anxiety disorder: Secondary | ICD-10-CM | POA: Diagnosis present

## 2012-04-01 DIAGNOSIS — M810 Age-related osteoporosis without current pathological fracture: Secondary | ICD-10-CM | POA: Diagnosis present

## 2012-04-01 DIAGNOSIS — G4700 Insomnia, unspecified: Secondary | ICD-10-CM | POA: Diagnosis present

## 2012-04-01 DIAGNOSIS — E669 Obesity, unspecified: Secondary | ICD-10-CM | POA: Diagnosis present

## 2012-04-01 DIAGNOSIS — J441 Chronic obstructive pulmonary disease with (acute) exacerbation: Secondary | ICD-10-CM

## 2012-04-01 DIAGNOSIS — R000 Tachycardia, unspecified: Secondary | ICD-10-CM

## 2012-04-01 DIAGNOSIS — E119 Type 2 diabetes mellitus without complications: Secondary | ICD-10-CM | POA: Diagnosis present

## 2012-04-01 DIAGNOSIS — F319 Bipolar disorder, unspecified: Secondary | ICD-10-CM | POA: Diagnosis present

## 2012-04-01 DIAGNOSIS — Z888 Allergy status to other drugs, medicaments and biological substances status: Secondary | ICD-10-CM

## 2012-04-01 LAB — BASIC METABOLIC PANEL
CO2: 36 mEq/L — ABNORMAL HIGH (ref 19–32)
GFR calc non Af Amer: >90 mL/min (ref >90)
Glucose, Bld: 142 mg/dL — ABNORMAL HIGH (ref 70–99)
Potassium: 4.2 mEq/L (ref 3.5–5.1)
Sodium: 138 mEq/L (ref 135–145)

## 2012-04-01 LAB — PRO B NATRIURETIC PEPTIDE: Pro B Natriuretic peptide (BNP): 1023 pg/mL — ABNORMAL HIGH (ref 0–125)

## 2012-04-01 LAB — URINALYSIS, ROUTINE W REFLEX MICROSCOPIC
Bilirubin Urine: NEGATIVE
Hgb urine dipstick: NEGATIVE
Ketones, ur: NEGATIVE mg/dL
Nitrite: NEGATIVE
Specific Gravity, Urine: 1.01 (ref 1.005–1.030)
pH: 6 (ref 5.0–8.0)

## 2012-04-01 LAB — CBC
Platelets: 191 10*3/uL (ref 150–400)
RBC: 4.16 MIL/uL (ref 3.87–5.11)
RDW: 14 % (ref 11.5–15.5)
WBC: 6.2 10*3/uL (ref 4.0–10.5)

## 2012-04-01 LAB — DIFFERENTIAL
Basophils Absolute: 0 10*3/uL (ref 0.0–0.1)
Lymphocytes Relative: 42 % (ref 12–46)
Lymphs Abs: 2.6 10*3/uL (ref 0.7–4.0)
Neutrophils Relative %: 42 % — ABNORMAL LOW (ref 43–77)

## 2012-04-01 MED ORDER — FUROSEMIDE 10 MG/ML IJ SOLN
40.0000 mg | Freq: Once | INTRAMUSCULAR | Status: AC
  Administered 2012-04-01: 40 mg via INTRAVENOUS
  Filled 2012-04-01: qty 4

## 2012-04-01 MED ORDER — HYDROCODONE-ACETAMINOPHEN 5-325 MG PO TABS
1.0000 | ORAL_TABLET | Freq: Once | ORAL | Status: AC
  Administered 2012-04-01: 1 via ORAL
  Filled 2012-04-01: qty 1

## 2012-04-01 MED ORDER — KETOROLAC TROMETHAMINE 30 MG/ML IJ SOLN
30.0000 mg | Freq: Once | INTRAMUSCULAR | Status: AC
  Administered 2012-04-01: 30 mg via INTRAVENOUS
  Filled 2012-04-01: qty 1

## 2012-04-01 NOTE — ED Notes (Signed)
Pt in via [facility] from [facility] with bilateral leg edema for several weeks denies sob was recently seen by pcp pt on continuous 2L [place] for copd

## 2012-04-01 NOTE — ED Notes (Signed)
IV team will collect labs with the start of IV

## 2012-04-01 NOTE — ED Provider Notes (Signed)
History     CSN: [id number]  Arrival date & time 04/01/12  1755   First MD Initiated Contact with Patient 04/01/12 2008      Chief Complaint  Patient presents with  . Leg Swelling     HPI  History provided by the patient. Patient is a 71 year old female with history of hypertension, hyperlipidemia, COPD, DVT and PE status post [place] filter placed presents with complaints of increased bilateral lower extremity swelling, erythema and pain for the past 3 weeks. Patient reports chronic problems with lower x-ray swelling. She has been taking her normal fluid pills regularly. She currently taking torsemide 20 mg twice a day. Patient has not changed the suture missed any doses. Patient denies any fever, chills, sweats. She denies any aggravating or alleviating factors. She denies any chest pain, shortness of breath, or palpitations. Patient does wear home oxygen at 2 L. Symptoms are described as moderate. She has no other complaints.    Past Medical History  Diagnosis Date  . Anxiety   . Arthritis   . COPD (chronic obstructive pulmonary disease)   . GERD (gastroesophageal reflux disease)   . Hyperlipidemia   . Osteoporosis   . DVT (deep venous thrombosis)   . PE (pulmonary thromboembolism)   . Hypertension      PT DENIES....ON NO  MEDS  . Diabetes mellitus   . Cancer 1969    cervical  . Pneumonia     h/o  . Bronchitis     h/o  . Blood transfusion   . Anemia   . Bipolar affect, depressed     Past Surgical History  Procedure Date  . Vena cava filter placement   . 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 Eye surgery     CATARACTS  . Achilles tendon lengthening 11/18/11    and repair w/posterior tibial tendon lengthening; right  foot  . Cholecystectomy   . Abdominal hysterectomy 1969    "womb taken out for cervical cancer"  . Cataract extraction, bilateral ~ 2008  . Tonsillectomy     "as a child"  . Achilles tendon surgery 11/18/2011    Procedure: ACHILLES  TENDON REPAIR;  Surgeon: Toni Arthurs, MD;  Location: [facility] OR;  Service: Orthopedics;  Laterality: Right;  Right Posterior Tibial Tendon Lenghtening and Tendon Achilles Lenghtening     No family history on file.  History  Substance Use Topics  . Smoking status: Former Smoker -- 1.0 packs/day    Types: Cigarettes    Quit date: 11/14/1986  . Smokeless tobacco: Never Used  . Alcohol Use: No    OB History    Grav Para Term Preterm Abortions TAB SAB Ect Mult Living                  Review of Systems  Constitutional: Negative for fever and chills.  Respiratory: Negative for cough and shortness of breath.   Cardiovascular: Negative for chest pain and palpitations.  Gastrointestinal: Negative for nausea and vomiting.  Neurological: Negative for weakness and numbness.  Psychiatric/Behavioral: Negative for confusion.    Allergies  Morphine and related; Sulfamethoxazole w/trimethoprim; and Zyprexa  Home Medications   Current Outpatient Rx  Name Route Sig Dispense Refill  . ACETAMINOPHEN 325 MG PO TABS Oral Take 2 tablets (650 mg total) by mouth every 6 (six) hours as needed for pain. 30 tablet 0  . ALPRAZOLAM 1 MG PO TABS Oral Take 1 mg by mouth 3 (three) times  daily.    Marland Kitchen DIVALPROEX SODIUM ER 250 MG PO TB24 Oral Take 750 mg by mouth 2 (two) times daily.     Marland Kitchen ENSURE PO LIQD Oral Take 1 Can by mouth 3 (three) times daily with meals.      Marland Kitchen FLUTICASONE-SALMETEROL 500-50 MCG/DOSE IN AEPB Inhalation Inhale 1 puff into the lungs 2 (two) times daily. 60 each 11  . GABAPENTIN 300 MG PO CAPS Oral Take 3 capsules (900 mg total) by mouth 3 (three) times daily. 270 capsule 3  . LEVALBUTEROL TARTRATE 45 MCG/ACT IN AERO Inhalation Inhale 2 puffs into the lungs every 4 (four) hours as needed for wheezing. 1 Inhaler 1  . METFORMIN HCL 1000 MG PO TABS Oral Take 1 tablet (1,000 mg total) by mouth 2 (two) times daily with a meal. 64 tablet 11  . METOPROLOL TARTRATE 25 MG PO TABS Oral Take 0.5 tablets  (12.5 mg total) by mouth 2 (two) times daily. 30 tablet 11  . NYSTATIN-TRIAMCINOLONE 100000-0.1 UNIT/GM-% EX CREA Topical Apply 1 application topically 4 (four) times daily.      Marland Kitchen OMEPRAZOLE 20 MG PO CPDR Oral Take 20 mg by mouth daily.    Marland Kitchen POLYETHYLENE GLYCOL 3350 PO PACK Oral Take 17 g by mouth daily.      Marland Kitchen POTASSIUM CHLORIDE CRYS ER 10 MEQ PO TBCR Oral Take 5-10 mEq by mouth 2 (two) times daily. Takes in the am and 5 meq at bedtime    . PREDNISONE 50 MG PO TABS Oral Take 1 tablet (50 mg total) by mouth daily. 5 tablet 0  . PROMETHAZINE HCL 25 MG PO TABS Oral Take 25 mg by mouth every 6 (six) hours as needed. nausea    . TIOTROPIUM BROMIDE MONOHYDRATE 18 MCG IN CAPS Inhalation Place 1 capsule (18 mcg total) into inhaler and inhale daily. 30 capsule 11  . TORSEMIDE 10 MG PO TABS Oral Take 2 tablets (20 mg total) by mouth 2 (two) times daily as needed (lower extremity swelling). Takes 2 tablets every morning for 20mg  dosage and takes 1 tablet at night for 10mg  dosage 30 tablet 3  . VENLAFAXINE HCL ER 150 MG PO CP24 Oral Take 2 capsules (300 mg total) by mouth at bedtime. 60 capsule 3  . VITAMIN C 500 MG PO TABS Oral Take 500 mg by mouth 2 (two) times daily.    . CHLORPROMAZINE HCL 25 MG PO TABS Oral Take 1 tablet (25 mg total) by mouth 3 (three) times daily. 90 tablet 3  . QUETIAPINE FUMARATE 25 MG PO TABS Oral Take 1 tablet (25 mg total) by mouth at bedtime. 30 tablet 3  . TRAZODONE HCL 50 MG PO TABS Oral Take 1 tablet (50 mg total) by mouth at bedtime. 30 tablet 3    BP 131/63  Pulse 99  Temp(Src) 98.1 F (36.7 C) (Oral)  Resp 18  SpO2 98%  Physical Exam  Nursing note and vitals reviewed. Constitutional: She is oriented to person, place, and time. She appears well-developed and well-nourished. No distress.  HENT:  Head: Normocephalic and atraumatic.  Neck:       No meningeal signs  Cardiovascular: Normal rate and regular rhythm.   Pulmonary/Chest: Effort normal and breath  sounds normal. No respiratory distress. She has no wheezes. She has no rales.  Abdominal: Soft. She exhibits no distension. There is no tenderness.  Musculoskeletal: She exhibits edema and tenderness.       Pitting edema bilateral lower extremities extending to  knees. Increased erythema to bilateral lower legs. Dorsal pedal pulses difficult to locate by palpation bilaterally. Good pulses by Doppler. Feet are warm with cap refill to toes <2 sec. Lower leg slightly warm to palpation. No lesions to skin. No induration.  Neurological: She is alert and oriented to person, place, and time.  Skin: Skin is warm and dry. No rash noted.  Psychiatric: She has a normal mood and affect. Her behavior is normal.    ED Course  Procedures  Results for orders placed during the hospital encounter of 04/01/12  CBC      Component Value Range   WBC 6.2  4.0 - 10.5 (K/uL)   RBC 4.16  3.87 - 5.11 (MIL/uL)   Hemoglobin 12.5  12.0 - 15.0 (g/dL)   HCT 78.2  95.6 - 21.3 (%)   MCV 95.4  78.0 - 100.0 (fL)   MCH 30.0  26.0 - 34.0 (pg)   MCHC 31.5  30.0 - 36.0 (g/dL)   RDW 08.6  57.8 - 46.9 (%)   Platelets 191  150 - 400 (K/uL)  DIFFERENTIAL      Component Value Range   Neutrophils Relative 42 (*) 43 - 77 (%)   Neutro Abs 2.6  1.7 - 7.7 (K/uL)   Lymphocytes Relative 42  12 - 46 (%)   Lymphs Abs 2.6  0.7 - 4.0 (K/uL)   Monocytes Relative 12  3 - 12 (%)   Monocytes Absolute 0.8  0.1 - 1.0 (K/uL)   Eosinophils Relative 3  0 - 5 (%)   Eosinophils Absolute 0.2  0.0 - 0.7 (K/uL)   Basophils Relative 1  0 - 1 (%)   Basophils Absolute 0.0  0.0 - 0.1 (K/uL)  BASIC METABOLIC PANEL      Component Value Range   Sodium 138  135 - 145 (mEq/L)   Potassium 4.2  3.5 - 5.1 (mEq/L)   Chloride 95 (*) 96 - 112 (mEq/L)   CO2 36 (*) 19 - 32 (mEq/L)   Glucose, Bld 142 (*) 70 - 99 (mg/dL)   BUN 11  6 - 23 (mg/dL)   Creatinine, Ser 6.29  0.50 - 1.10 (mg/dL)   Calcium 9.1  8.4 - 52.8 (mg/dL)   GFR calc non Af Amer >90  >90  (mL/min)   GFR calc Af Amer >90  >90 (mL/min)  URINALYSIS, ROUTINE W REFLEX MICROSCOPIC      Component Value Range   Color, Urine YELLOW  YELLOW    APPearance CLEAR  CLEAR    Specific Gravity, Urine 1.010  1.005 - 1.030    pH 6.0  5.0 - 8.0    Glucose, UA NEGATIVE  NEGATIVE (mg/dL)   Hgb urine dipstick NEGATIVE  NEGATIVE    Bilirubin Urine NEGATIVE  NEGATIVE    Ketones, ur NEGATIVE  NEGATIVE (mg/dL)   Protein, ur NEGATIVE  NEGATIVE (mg/dL)   Urobilinogen, UA 0.2  0.0 - 1.0 (mg/dL)   Nitrite NEGATIVE  NEGATIVE    Leukocytes, UA NEGATIVE  NEGATIVE   PRO B NATRIURETIC PEPTIDE      Component Value Range   Pro B Natriuretic peptide (BNP) 1023.0 (*) 0 - 125 (pg/mL)  MRSA PCR SCREENING      Component Value Range   MRSA by PCR NEGATIVE  NEGATIVE   RAPID STREP SCREEN      Component Value Range   Streptococcus, Group A Screen (Direct) NEGATIVE  NEGATIVE   BASIC METABOLIC PANEL      Component Value  Range   Sodium 134 (*) 135 - 145 (mEq/L)   Potassium 4.3  3.5 - 5.1 (mEq/L)   Chloride 93 (*) 96 - 112 (mEq/L)   CO2 33 (*) 19 - 32 (mEq/L)   Glucose, Bld 184 (*) 70 - 99 (mg/dL)   BUN 11  6 - 23 (mg/dL)   Creatinine, Ser 9.60 (*) 0.50 - 1.10 (mg/dL)   Calcium 8.9  8.4 - 45.4 (mg/dL)   GFR calc non Af Amer >90  >90 (mL/min)   GFR calc Af Amer >90  >90 (mL/min)  GLUCOSE, CAPILLARY      Component Value Range   Glucose-Capillary 161 (*) 70 - 99 (mg/dL)  GLUCOSE, CAPILLARY      Component Value Range   Glucose-Capillary 162 (*) 70 - 99 (mg/dL)   Comment 1 Documented in Chart     Comment 2 Notify RN    GLUCOSE, CAPILLARY      Component Value Range   Glucose-Capillary 195 (*) 70 - 99 (mg/dL)   Comment 1 Documented in Chart          Dg Chest 2 View  04/01/2012  *RADIOLOGY REPORT*  Clinical Data: Leg swelling  CHEST - 2 VIEW  Comparison: 01/20/2012  Findings: Increased interstitial markings, suspicious for mild interstitial edema. No pleural lesion.  No pneumothorax.  The heart is  normal in size.  Degenerative changes of the visualized thoracolumbar spine.  IMPRESSION: Suspected mild interstitial edema.  Original Report Authenticated By: Charline Bills, M.D.     1. CHF (congestive heart failure)   2. COPD exacerbation   3. Tachycardia   4. Type II or unspecified type diabetes mellitus without mention of complication, not stated as uncontrolled       MDM  8:10PM patient seen and evaluated. Patient in no acute distress.  Pt seen and discussed with attending physician.  Pt with signs for CHF.  Lasix given.  Will plan for admission.  I have spoke with [facility] hospitalist.  They will see pt and admit.      Angus Seller, [place] 04/02/12 1540

## 2012-04-01 NOTE — ED Notes (Signed)
IV team to obtain labs with the start of IV, RN aware

## 2012-04-01 NOTE — ED Notes (Signed)
MD/PA at bedside. 

## 2012-04-01 NOTE — ED Notes (Signed)
Pt in from home with bilateral leg edema and pain in legs for several weeks states unable to ambulate d/t pain and swelling hx of copd

## 2012-04-02 ENCOUNTER — Telehealth: Payer: Self-pay | Admitting: Family Medicine

## 2012-04-02 DIAGNOSIS — I509 Heart failure, unspecified: Secondary | ICD-10-CM | POA: Diagnosis present

## 2012-04-02 DIAGNOSIS — R609 Edema, unspecified: Secondary | ICD-10-CM

## 2012-04-02 LAB — BASIC METABOLIC PANEL
BUN: 11 mg/dL (ref 6–23)
GFR calc Af Amer: >90 mL/min (ref >90)
GFR calc non Af Amer: >90 mL/min (ref >90)
Potassium: 4.3 mEq/L (ref 3.5–5.1)
Sodium: 134 mEq/L — ABNORMAL LOW (ref 135–145)

## 2012-04-02 LAB — RAPID STREP SCREEN (MED CTR MEBANE ONLY): Streptococcus, Group A Screen (Direct): NEGATIVE

## 2012-04-02 LAB — GLUCOSE, CAPILLARY
Glucose-Capillary: 161 mg/dL — ABNORMAL HIGH (ref 70–99)
Glucose-Capillary: 162 mg/dL — ABNORMAL HIGH (ref 70–99)
Glucose-Capillary: 272 mg/dL — ABNORMAL HIGH (ref 70–99)

## 2012-04-02 MED ORDER — ENOXAPARIN SODIUM 40 MG/0.4ML ~~LOC~~ SOLN
40.0000 mg | SUBCUTANEOUS | Status: DC
  Administered 2012-04-02 – 2012-04-04 (×3): 40 mg via SUBCUTANEOUS
  Filled 2012-04-02 (×4): qty 0.4

## 2012-04-02 MED ORDER — QUETIAPINE FUMARATE 25 MG PO TABS
25.0000 mg | ORAL_TABLET | Freq: Every day | ORAL | Status: DC
  Administered 2012-04-03: 25 mg via ORAL
  Filled 2012-04-02 (×3): qty 1

## 2012-04-02 MED ORDER — FLUTICASONE-SALMETEROL 500-50 MCG/DOSE IN AEPB
1.0000 | INHALATION_SPRAY | Freq: Two times a day (BID) | RESPIRATORY_TRACT | Status: DC
Start: 2012-04-02 — End: 2012-04-04
  Administered 2012-04-02 – 2012-04-04 (×5): 1 via RESPIRATORY_TRACT
  Filled 2012-04-02: qty 14

## 2012-04-02 MED ORDER — POTASSIUM CHLORIDE CRYS ER 10 MEQ PO TBCR
10.0000 meq | EXTENDED_RELEASE_TABLET | Freq: Every day | ORAL | Status: DC
  Administered 2012-04-02: 10 meq via ORAL
  Filled 2012-04-02: qty 1

## 2012-04-02 MED ORDER — CHLORPROMAZINE HCL 25 MG PO TABS
25.0000 mg | ORAL_TABLET | Freq: Three times a day (TID) | ORAL | Status: DC
  Administered 2012-04-02 – 2012-04-04 (×7): 25 mg via ORAL
  Filled 2012-04-02 (×9): qty 1

## 2012-04-02 MED ORDER — VENLAFAXINE HCL ER 150 MG PO CP24
300.0000 mg | ORAL_CAPSULE | Freq: Every day | ORAL | Status: DC
  Administered 2012-04-03 (×2): 300 mg via ORAL
  Filled 2012-04-02 (×3): qty 2

## 2012-04-02 MED ORDER — TRAZODONE HCL 150 MG PO TABS
300.0000 mg | ORAL_TABLET | Freq: Every day | ORAL | Status: DC
  Administered 2012-04-03 (×2): 300 mg via ORAL
  Filled 2012-04-02 (×3): qty 2

## 2012-04-02 MED ORDER — PANTOPRAZOLE SODIUM 40 MG PO TBEC
40.0000 mg | DELAYED_RELEASE_TABLET | Freq: Every day | ORAL | Status: DC
  Administered 2012-04-03 – 2012-04-04 (×2): 40 mg via ORAL
  Filled 2012-04-02 (×2): qty 1

## 2012-04-02 MED ORDER — DIVALPROEX SODIUM ER 500 MG PO TB24
750.0000 mg | ORAL_TABLET | Freq: Two times a day (BID) | ORAL | Status: DC
  Administered 2012-04-02 – 2012-04-04 (×6): 750 mg via ORAL
  Filled 2012-04-02 (×7): qty 1

## 2012-04-02 MED ORDER — POTASSIUM CHLORIDE CRYS ER 20 MEQ PO TBCR
20.0000 meq | EXTENDED_RELEASE_TABLET | Freq: Every day | ORAL | Status: DC
  Administered 2012-04-03 – 2012-04-04 (×2): 20 meq via ORAL
  Filled 2012-04-02 (×3): qty 1

## 2012-04-02 MED ORDER — FUROSEMIDE 10 MG/ML IJ SOLN
40.0000 mg | Freq: Two times a day (BID) | INTRAMUSCULAR | Status: DC
  Administered 2012-04-02 – 2012-04-03 (×3): 40 mg via INTRAVENOUS
  Filled 2012-04-02 (×5): qty 4

## 2012-04-02 MED ORDER — TIOTROPIUM BROMIDE MONOHYDRATE 18 MCG IN CAPS
18.0000 ug | ORAL_CAPSULE | Freq: Every day | RESPIRATORY_TRACT | Status: DC
  Administered 2012-04-02 – 2012-04-04 (×3): 18 ug via RESPIRATORY_TRACT
  Filled 2012-04-02: qty 5

## 2012-04-02 MED ORDER — INSULIN ASPART 100 UNIT/ML ~~LOC~~ SOLN: 0.0000 [IU] | Freq: Every day | SUBCUTANEOUS | Status: DC

## 2012-04-02 MED ORDER — ACETAMINOPHEN 325 MG PO TABS
650.0000 mg | ORAL_TABLET | Freq: Four times a day (QID) | ORAL | Status: DC | PRN
  Administered 2012-04-02 – 2012-04-03 (×2): 650 mg via ORAL
  Filled 2012-04-02 (×2): qty 2

## 2012-04-02 MED ORDER — DOCUSATE SODIUM 100 MG PO CAPS
100.0000 mg | ORAL_CAPSULE | Freq: Two times a day (BID) | ORAL | Status: DC
  Administered 2012-04-02 – 2012-04-04 (×5): 100 mg via ORAL
  Filled 2012-04-02 (×6): qty 1

## 2012-04-02 MED ORDER — ENOXAPARIN SODIUM 40 MG/0.4ML ~~LOC~~ SOLN: 40.0000 mg | SUBCUTANEOUS | Status: DC

## 2012-04-02 MED ORDER — METOPROLOL TARTRATE 12.5 MG HALF TABLET
12.5000 mg | ORAL_TABLET | Freq: Two times a day (BID) | ORAL | Status: DC
  Administered 2012-04-02 – 2012-04-04 (×5): 12.5 mg via ORAL
  Filled 2012-04-02 (×6): qty 1

## 2012-04-02 MED ORDER — INSULIN ASPART 100 UNIT/ML ~~LOC~~ SOLN
0.0000 [IU] | Freq: Three times a day (TID) | SUBCUTANEOUS | Status: DC
  Administered 2012-04-02: 8 [IU] via SUBCUTANEOUS
  Administered 2012-04-02 – 2012-04-04 (×6): 3 [IU] via SUBCUTANEOUS

## 2012-04-02 MED ORDER — BIOTENE DRY MOUTH MT LIQD
15.0000 mL | Freq: Two times a day (BID) | OROMUCOSAL | Status: DC
  Administered 2012-04-02 – 2012-04-03 (×3): 15 mL via OROMUCOSAL

## 2012-04-02 MED ORDER — POTASSIUM CHLORIDE CRYS ER 10 MEQ PO TBCR
10.0000 meq | EXTENDED_RELEASE_TABLET | Freq: Every day | ORAL | Status: DC
  Administered 2012-04-03 (×2): 10 meq via ORAL
  Filled 2012-04-02 (×3): qty 1

## 2012-04-02 MED ORDER — ALPRAZOLAM 0.25 MG PO TABS
1.0000 mg | ORAL_TABLET | Freq: Three times a day (TID) | ORAL | Status: DC
  Administered 2012-04-02 – 2012-04-04 (×7): 1 mg via ORAL
  Filled 2012-04-02 (×2): qty 2
  Filled 2012-04-02: qty 4
  Filled 2012-04-02 (×4): qty 2

## 2012-04-02 MED ORDER — ALPRAZOLAM 0.5 MG PO TABS: 1.0000 mg | ORAL_TABLET | Freq: Three times a day (TID) | ORAL | Status: DC

## 2012-04-02 MED ORDER — TRAZODONE HCL 50 MG PO TABS
50.0000 mg | ORAL_TABLET | Freq: Every day | ORAL | Status: DC
  Filled 2012-04-02: qty 1

## 2012-04-02 MED ORDER — POTASSIUM CHLORIDE CRYS ER 10 MEQ PO TBCR
5.0000 meq | EXTENDED_RELEASE_TABLET | Freq: Every day | ORAL | Status: DC
  Filled 2012-04-02: qty 1

## 2012-04-02 MED ORDER — OXYCODONE HCL 5 MG PO TABS
10.0000 mg | ORAL_TABLET | ORAL | Status: DC | PRN
  Administered 2012-04-02 – 2012-04-04 (×10): 10 mg via ORAL
  Filled 2012-04-02 (×10): qty 2

## 2012-04-02 MED ORDER — ALPRAZOLAM 0.5 MG PO TABS
0.5000 mg | ORAL_TABLET | Freq: Three times a day (TID) | ORAL | Status: DC
  Administered 2012-04-02 (×3): 0.5 mg via ORAL
  Filled 2012-04-02 (×3): qty 1

## 2012-04-02 MED ORDER — GABAPENTIN 300 MG PO CAPS
900.0000 mg | ORAL_CAPSULE | Freq: Three times a day (TID) | ORAL | Status: DC
  Administered 2012-04-02 – 2012-04-04 (×7): 900 mg via ORAL
  Filled 2012-04-02 (×9): qty 3

## 2012-04-02 MED ORDER — INSULIN ASPART 100 UNIT/ML ~~LOC~~ SOLN
0.0000 [IU] | SUBCUTANEOUS | Status: DC
  Administered 2012-04-02: 3 [IU] via SUBCUTANEOUS

## 2012-04-02 MED ORDER — LEVALBUTEROL TARTRATE 45 MCG/ACT IN AERO
2.0000 | INHALATION_SPRAY | RESPIRATORY_TRACT | Status: DC | PRN
  Administered 2012-04-03: 2 via RESPIRATORY_TRACT
  Filled 2012-04-02: qty 15

## 2012-04-02 MED ORDER — ACETAMINOPHEN 650 MG RE SUPP: 650.0000 mg | Freq: Four times a day (QID) | RECTAL | Status: DC | PRN

## 2012-04-02 MED ORDER — POTASSIUM CHLORIDE CRYS ER 10 MEQ PO TBCR
5.0000 meq | EXTENDED_RELEASE_TABLET | Freq: Two times a day (BID) | ORAL | Status: DC
  Filled 2012-04-02 (×2): qty 1

## 2012-04-02 NOTE — Progress Notes (Signed)
I saw Ms. Rish this morning as her PCP. She has been living at [facility] and I believe has seen a doctor there as well as continued to follow with [facility].   She was seen by Dr. Earnest Bailey on 3/21 for evaluation of LE edema.  She was treated with Unna boots and instructed to return in one week.  The staff of the NH removed the unna boots and did not feel it was necessary for her to return for recheck.  I believe that her medications have changed since I last saw her and her daughter and reconciled her medications.  I looked for a packet from her NH, but this was not in the chart or on the floor.  I have called her daughter and left a message to have her call me and go over her medication.   Galya Dunnigan 11:36 AM

## 2012-04-02 NOTE — Progress Notes (Signed)
Clinical Social Work Department BRIEF PSYCHOSOCIAL ASSESSMENT 04/02/2012  Patient:  [name], [name]     Account Number:  [id number]     Admit date:  04/01/2012  Clinical Social Worker:  Skip Mayer  Date/Time:  04/02/2012 01:00 PM  Referred by:  Physician  Date Referred:  04/02/2012 Referred for  SNF Placement   Other Referral:   Interview type:  Family Other interview type:    PSYCHOSOCIAL DATA Living Status:  FACILITY Admitted from facility:  [facility] Level of care:  Skilled Nursing Facility Primary support name:  [name] Primary support relationship to patient:  CHILD, ADULT Degree of support available:   Adequate    CURRENT CONCERNS Current Concerns  Post-Acute Placement   Other Concerns:    SOCIAL WORK ASSESSMENT / PLAN CSW spoke with pt's dtr, [name], who confirmed pt admitted from [facility] where she is a long term SNF resident and plan is for her to return when stable. CSW spoke with w/e RN supervisor at [facility] who also confirmed pt is LT SNF resident and able to return at d/c.   Assessment/plan status:  Information/Referral to [facility] Other assessment/ plan:   Information/referral to community resources:   SNF    PATIENT'S/FAMILY'S RESPONSE TO PLAN OF CARE: Pt's dtr reports pt and pt's family satisfied with care at [facility] and are agreeable with her return.        Dellie Burns, MSW, [place] [phone] (weekend)

## 2012-04-02 NOTE — ED Provider Notes (Signed)
Medical screening examination/treatment/procedure(s) were conducted as a shared visit with non-physician practitioner(s) and myself.  I personally evaluated the patient during the encounter  Min SOB. Bibasilar crackles. Mild pul edema. Significant b/l LE edema with +warmth LLE > RLE. Min ttp. No fever/chills. Doubt cellulitis. Admit for gentle diuresis.  Forbes Cellar, MD 04/02/12 [phone]

## 2012-04-02 NOTE — Progress Notes (Addendum)
Family Medicine Teaching [facility] Admission History and Physical  Patient name: [name] Medical record number: [id number] Date of birth: [birth year] Age: 71 y.o. Gender: female  Primary Care Provider: Ellery Plunk, MD, MD  Chief Complaint: Bilateral leg pain  History of Present Illness: [name] is a 71 y.o. year old female presenting with worsening bilateral lower extremity edema and pain, as well as an elevated Pro-BNP. The patient was hospitalized for this same problem on 01/20/12. It was treated successfully with loop diuretics. Since that time the fluid in her legs has re-accumulated. It was treated on 3/21 with compression stocking, which helped mildly. The main reason that the patient was brought to the ED from her ALF today was secondary to the pain associated with the swelling. The patient denies   In the ED at [facility] a Pro-BNP was elevated to > 1000, which is up from 520 on 01/19/11. Additionally, a chest x-ray demonstrated mild pulmonary congestion. Therefore, the patient was given 40 mg of furosemide in the ED and transferred to [facility] for further evaluation and care.   Review Of Systems: Per HPI with the following additions: Sore throat, severe anxiety Negative: dyspnea, orthopnea, dizziness, syncope, chest pain, fever, chills, adbominal pain, dysuria, decreased appetite   Patient Active Problem List  Diagnoses  . NEOPLASM UNCERTAIN BEHAVIOR MAJOR SALIV GLANDS  . DIABETES MELLITUS, TYPE II  . HYPERLIPIDEMIA  . OBESITY, NOS  . ANEMIA-NOS  . BIPOLAR DISORDER  . ANXIETY  . INSOMNIA, CHRONIC  . Other chronic pain  . HYPERTENSION, BENIGN SYSTEMIC  . PULMONARY EMBOLISM  . COR PULMONALE  . DEEP VENOUS THROMBOPHLEBITIS, LEG, LEFT  . VENOUS INSUFFICIENCY, CHRONIC  . COPD  . PULMONARY NODULE, SOLITARY  . GERD  . OSTEOPENIA  . SYMPTOM, INCONTINENCE, URINARY NOS  . TOBACCO ABUSE, HX OF  . GREENFIELD FILTER INSERTION, HX OF  . Wheelchair dependence    . Leg swelling  . Chronic right shoulder pain  . Ankle arthritis  . Candida albicans infection  . Thrush  . Skin pustule(s)  . Tachycardia  . Skin tear  . COPD exacerbation   Past Medical History: Past Medical History  Diagnosis Date  . Anxiety   . Arthritis   . COPD (chronic obstructive pulmonary disease)   . GERD (gastroesophageal reflux disease)   . Hyperlipidemia   . Osteoporosis   . DVT (deep venous thrombosis)   . PE (pulmonary thromboembolism)   . Hypertension      PT DENIES....ON NO  MEDS  . Diabetes mellitus   . Cancer 1969    cervical  . Pneumonia     h/o  . Bronchitis     h/o  . Blood transfusion   . Anemia   . Bipolar affect, depressed     Past Surgical History: Past Surgical History  Procedure Date  . Vena cava filter placement   . 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 Eye surgery     CATARACTS  . Achilles tendon lengthening 11/18/11    and repair w/posterior tibial tendon lengthening; right  foot  . Cholecystectomy   . Abdominal hysterectomy 1969    "womb taken out for cervical cancer"  . Cataract extraction, bilateral ~ 2008  . Tonsillectomy     "as a child"  . Achilles tendon surgery 11/18/2011    Procedure: ACHILLES TENDON REPAIR;  Surgeon: Toni Arthurs, MD;  Location: [facility] OR;  Service: Orthopedics;  Laterality: Right;  Right Posterior Tibial Tendon Lenghtening and Tendon Achilles Lenghtening     Social History: History   Social History  . Marital Status: Divorced    Spouse Name: N/A    Number of Children: N/A  . Years of Education: N/A   Social History Main Topics  . Smoking status: Former Smoker -- 1.0 packs/day    Types: Cigarettes    Quit date: 11/14/1986  . Smokeless tobacco: Never Used  . Alcohol Use: No  . Drug Use: No  . Sexually Active: No   Other Topics Concern  . None   Social History Narrative  . None    Family History: No family history on file.  Allergies: Allergies  Allergen Reactions  .  Morphine And Related Other (See Comments)    Pain dysregulation Mood disruption  . Sulfamethoxazole W/Trimethoprim Hives  . Zyprexa     Hallucinations and disorientation    Current Facility-Administered Medications  Medication Dose Route Frequency Provider Last Rate Last Dose  . furosemide (LASIX) injection 40 mg  40 mg Intravenous Once Angus Seller, PA   40 mg at 04/01/12 2154  . HYDROcodone-acetaminophen (NORCO) 5-325 MG per tablet 1 tablet  1 tablet Oral Once Angus Seller, PA   1 tablet at 04/01/12 2210  . ketorolac (TORADOL) 30 MG/ML injection 30 mg  30 mg Intravenous Once Angus Seller, PA   30 mg at 04/01/12 2216    No current facility-administered medications on file prior to encounter.   Current Outpatient Prescriptions on File Prior to Encounter  Medication Sig Dispense Refill  . acetaminophen (TYLENOL) 325 MG tablet Take 2 tablets (650 mg total) by mouth every 6 (six) hours as needed for pain.  30 tablet  0  . ALPRAZolam (XANAX) 1 MG tablet Take 1 mg by mouth 3 (three) times daily.      . divalproex (DEPAKOTE ER) 250 MG 24 hr tablet Take 750 mg by mouth 2 (two) times daily.       Marland Kitchen ENSURE (ENSURE) Take 1 Can by mouth 3 (three) times daily with meals.        . Fluticasone-Salmeterol (ADVAIR DISKUS) 500-50 MCG/DOSE AEPB Inhale 1 puff into the lungs 2 (two) times daily.  60 each  11  . gabapentin (NEURONTIN) 300 MG capsule Take 3 capsules (900 mg total) by mouth 3 (three) times daily.  270 capsule  3  . levalbuterol (XOPENEX HFA) 45 MCG/ACT inhaler Inhale 2 puffs into the lungs every 4 (four) hours as needed for wheezing.  1 Inhaler  1  . metFORMIN (GLUCOPHAGE) 1000 MG tablet Take 1 tablet (1,000 mg total) by mouth 2 (two) times daily with a meal.  64 tablet  11  . metoprolol tartrate (LOPRESSOR) 25 MG tablet Take 0.5 tablets (12.5 mg total) by mouth 2 (two) times daily.  30 tablet  11  . nystatin-triamcinolone (MYCOLOG II) cream Apply 1 application topically 4 (four) times  daily.        Marland Kitchen omeprazole (PRILOSEC) 20 MG capsule Take 20 mg by mouth daily.      . polyethylene glycol (MIRALAX / GLYCOLAX) packet Take 17 g by mouth daily.        . potassium chloride (K-DUR,KLOR-CON) 10 MEQ tablet Take 5-10 mEq by mouth 2 (two) times daily. Takes in the am and 5 meq at bedtime      . predniSONE (DELTASONE) 50 MG tablet Take 1 tablet (50 mg total) by mouth daily.  5 tablet  0  .  promethazine (PHENERGAN) 25 MG tablet Take 25 mg by mouth every 6 (six) hours as needed. nausea      . tiotropium (SPIRIVA) 18 MCG inhalation capsule Place 1 capsule (18 mcg total) into inhaler and inhale daily.  30 capsule  11  . torsemide (DEMADEX) 10 MG tablet Take 2 tablets (20 mg total) by mouth 2 (two) times daily as needed (lower extremity swelling). Takes 2 tablets every morning for 20mg  dosage and takes 1 tablet at night for 10mg  dosage  30 tablet  3  . venlafaxine (EFFEXOR-XR) 150 MG 24 hr capsule Take 2 capsules (300 mg total) by mouth at bedtime.  60 capsule  3  . vitamin C (ASCORBIC ACID) 500 MG tablet Take 500 mg by mouth 2 (two) times daily.      . chlorproMAZINE (THORAZINE) 25 MG tablet Take 1 tablet (25 mg total) by mouth 3 (three) times daily.  90 tablet  3  . QUEtiapine (SEROQUEL) 25 MG tablet Take 1 tablet (25 mg total) by mouth at bedtime.  30 tablet  3  . traZODone (DESYREL) 50 MG tablet Take 1 tablet (50 mg total) by mouth at bedtime.  30 tablet  3     Physical Exam: BP 139/84  Pulse 85  Temp(Src) 97.8 F (36.6 C) (Oral)  Resp 21  Wt 194 lb 14.2 oz (88.4 kg)  SpO2 97% General: alert, appears stated age, moderate distress and mildly obese HEENT: PERRLA, extra ocular movement intact, sclera clear, anicteric and OP dry; Erythema and yellow film on tonsils and uvula Heart: Distant heart sounds, no JVD; no hepatojugular reflex  Lungs: bibasilar rales Abdomen: abdomen is soft without significant tenderness, masses, organomegaly or guarding Extremities: edema 3+  bilaterally and venous stasis dermatitis noted; exquisite tenderness to palpation Skin: upper extremity ecchymoses  Neurology: normal without focal findings Psych: mildly agitated, persistently asking for Xanax  Labs and Imaging:  Results for orders placed during the hospital encounter of 04/01/12 (from the past 24 hour(s))  URINALYSIS, ROUTINE W REFLEX MICROSCOPIC     Status: Normal   Collection Time   04/01/12  9:06 PM      Component Value Range   Color, Urine YELLOW  YELLOW    APPearance CLEAR  CLEAR    Specific Gravity, Urine 1.010  1.005 - 1.030    pH 6.0  5.0 - 8.0    Glucose, UA NEGATIVE  NEGATIVE (mg/dL)   Hgb urine dipstick NEGATIVE  NEGATIVE    Bilirubin Urine NEGATIVE  NEGATIVE    Ketones, ur NEGATIVE  NEGATIVE (mg/dL)   Protein, ur NEGATIVE  NEGATIVE (mg/dL)   Urobilinogen, UA 0.2  0.0 - 1.0 (mg/dL)   Nitrite NEGATIVE  NEGATIVE    Leukocytes, UA NEGATIVE  NEGATIVE   CBC     Status: Normal   Collection Time   04/01/12  9:59 PM      Component Value Range   WBC 6.2  4.0 - 10.5 (K/uL)   RBC 4.16  3.87 - 5.11 (MIL/uL)   Hemoglobin 12.5  12.0 - 15.0 (g/dL)   HCT 16.1  09.6 - 04.5 (%)   MCV 95.4  78.0 - 100.0 (fL)   MCH 30.0  26.0 - 34.0 (pg)   MCHC 31.5  30.0 - 36.0 (g/dL)   RDW 40.9  81.1 - 91.4 (%)   Platelets 191  150 - 400 (K/uL)  DIFFERENTIAL     Status: Abnormal   Collection Time   04/01/12  9:59 PM  Component Value Range   Neutrophils Relative 42 (*) 43 - 77 (%)   Neutro Abs 2.6  1.7 - 7.7 (K/uL)   Lymphocytes Relative 42  12 - 46 (%)   Lymphs Abs 2.6  0.7 - 4.0 (K/uL)   Monocytes Relative 12  3 - 12 (%)   Monocytes Absolute 0.8  0.1 - 1.0 (K/uL)   Eosinophils Relative 3  0 - 5 (%)   Eosinophils Absolute 0.2  0.0 - 0.7 (K/uL)   Basophils Relative 1  0 - 1 (%)   Basophils Absolute 0.0  0.0 - 0.1 (K/uL)  BASIC METABOLIC PANEL     Status: Abnormal   Collection Time   04/01/12  9:59 PM      Component Value Range   Sodium 138  135 - 145 (mEq/L)    Potassium 4.2  3.5 - 5.1 (mEq/L)   Chloride 95 (*) 96 - 112 (mEq/L)   CO2 36 (*) 19 - 32 (mEq/L)   Glucose, Bld 142 (*) 70 - 99 (mg/dL)   BUN 11  6 - 23 (mg/dL)   Creatinine, Ser 1.61  0.50 - 1.10 (mg/dL)   Calcium 9.1  8.4 - 09.6 (mg/dL)   GFR calc non Af Amer >90  >90 (mL/min)   GFR calc Af Amer >90  >90 (mL/min)  PRO B NATRIURETIC PEPTIDE     Status: Abnormal   Collection Time   04/01/12  9:59 PM      Component Value Range   Pro B Natriuretic peptide (BNP) 1023.0 (*) 0 - 125 (pg/mL)     Assessment and Plan: [name] is a 71 y.o. year old female presenting with bilateral lower extremity edema and pain that is possibly related to a CHF exacerbation.  1. Bilateral Leg Edema - cause is venous insufficiency vs CHF or combination of both; Although an echo was normal 2 months ago, the Pro-BNP has doubled since then, given concern for right-sided CHF since no dyspnea or increased O2 requirement  - Start Lasix 40 mg IV BID  - repeat Echo  - strict I/O  - pain control wit Tylenol and oxycodone 10 mg for breakthrough and home neurontin  2. Psych - continue home meds of thorazine, seroquel, depakote, effexor, trazadone, xanax; hold buspar for now as unclear whether patient was taking this at home  3. COPD - continue O2, advair, and PRN levalbuterol  4. DM - hold home metformin, using moderate sliding scale  5. FENGI - SLIV; carb modified diet  6. PPX - Lovenox 40mg  daily  7. Disposition - Admit to [facility] Medicine Teaching Service under Dr. Mauricio Po; consider d/c when stable   Roslynn Amble MD  R3 Addendum 04/02/2012 Kristen Arbour, MD  Patient seen and examined seperately from Dr. Clinton Sawyer. I agree with his note above.  Briefly: 71 y/o female with extensive psychiatric history who went to [facility] ed for worsening leg pain. Found to have slightly worsened CHF with mild exacerbation. BNP 1000, mild pulmonary congestion on cxray. She has pain in both her lower extremities from venous  stasis dermatitis.   Filed Vitals:   04/01/12 2300 04/01/12 2330 04/02/12 0030 04/02/12 0213  BP: 105/51 105/52 112/55 139/84  Pulse:    85  Temp:    97.8 F (36.6 C)  TempSrc:    Oral  Resp:    21  Weight:    194 lb 14.2 oz (88.4 kg)  SpO2:    97%  General: alert, appears stated age, moderate  distress and mildly obese  Heart - Regular rate and rhythm.  No murmurs, gallops or rubs.    Lungs: bibasilar rales  Abdomen: soft and non-tender without masses, organomegaly or hernias noted.  No guarding or rebound Extremities: edema 3+ bilaterally and venous stasis dermatitis noted, tender to palpation bilaterally, negative homen's Skin: upper extremity ecchymoses  Neurology: normal without focal findings. Some dysarthria 2nd to dentures out.   A/P: 71 y/o c/f presenting with bilateral lower extremity edema and pain 2nd to CHF fluid accumulation and venous stasis.   1. Bilateral Leg Edema - cause is venous insufficiency and worsening CHF; Although an echo was normal  months ago, the Pro-BNP has doubled since then, given concern for right-sided CHF since no dyspnea or increased O2 requirement. Do not think this is cellulitis because it is bilateral, but does have a hot red appearance.   - Start Lasix 40 mg IV BID  - repeat Echo  - strict I/O  - pain control wit Tylenol and oxycodone 10 mg for breakthrough and home neurontin  2. Psych - continue home meds of thorazine, seroquel, depakote, effexor, trazadone, xanax; hold buspar for now as unclear whether patient was taking this at home.  - Patient is known to call out and be belligerent with the nursing staff. She was yelling this morning when I was on the floor. We had this issue with her last admission.   3. COPD - continue O2, advair, and PRN levalbuterol  4. DM - hold home metformin, using moderate sliding scale  5. FENGI - SLIV; carb modified diet  6. PPX - Lovenox 40mg  daily  7. Disposition - Patient can be discharged after some  diuresis. I do not think her leg pain will resolve quickly.   Kristen Arbour, MD 04/02/2012  FMTS Attending Admission Note  Patient seen and examined by me, known from prior admission as well.  She complains of pain and increased swelling in legs, which supercedes complaints of dyspnea.  She has been living in ALF since last admission.  She has had chronic complaints of bilateral leg pain.  I agree with plan for diuresis, consider Unna boot to both legs to help with leg edema. Of note, she had been weaned from opioids on last admission, which appear to have been reinstated.  I do not have strong feelings about making acute changes in her home medication regimen at this point.  Paula Compton, MD

## 2012-04-02 NOTE — Telephone Encounter (Signed)
Left message for melody to call [phone] to help [place] clarify medications.

## 2012-04-02 NOTE — ED Notes (Signed)
Pt unable to ambulate. 

## 2012-04-02 NOTE — H&P (Signed)
Family Medicine Teaching [facility] Admission History and Physical   Patient name: [name]          Medical record number: [id number] Date of birth: [birth year]        Age: 71 y.o.     Gender: female   Primary Care Provider: Ellery Plunk, MD, MD   Chief Complaint: Bilateral leg pain  History of Present Illness: [name] is a 71 y.o. year old female presenting with worsening bilateral lower extremity edema and pain, as well as an elevated Pro-BNP. The patient was hospitalized for this same problem on 01/20/12. It was treated successfully with loop diuretics. Since that time the fluid in her legs has re-accumulated. It was treated on 3/21 with compression stocking, which helped mildly. The main reason that the patient was brought to the ED from her ALF today was secondary to the pain associated with the swelling. The patient denies    In the ED at [facility] a Pro-BNP was elevated to > 1000, which is up from 520 on 01/19/11. Additionally, a chest x-ray demonstrated mild pulmonary congestion. Therefore, the patient was given 40 mg of furosemide in the ED and transferred to [facility] for further evaluation and care.    Review Of Systems: Per HPI with the following additions: Sore throat, severe anxiety Negative: dyspnea, orthopnea, dizziness, syncope, chest pain, fever, chills, adbominal pain, dysuria, decreased appetite      Patient Active Problem List   Diagnoses   .  NEOPLASM UNCERTAIN BEHAVIOR MAJOR SALIV GLANDS   .  DIABETES MELLITUS, TYPE II   .  HYPERLIPIDEMIA   .  OBESITY, NOS   .  ANEMIA-NOS   .  BIPOLAR DISORDER   .  ANXIETY   .  INSOMNIA, CHRONIC   .  Other chronic pain   .  HYPERTENSION, BENIGN SYSTEMIC   .  PULMONARY EMBOLISM   .  COR PULMONALE   .  DEEP VENOUS THROMBOPHLEBITIS, LEG, LEFT   .  VENOUS INSUFFICIENCY, CHRONIC   .  COPD   .  PULMONARY NODULE, SOLITARY   .  GERD   .  OSTEOPENIA   .  SYMPTOM, INCONTINENCE, URINARY NOS   .  TOBACCO  ABUSE, HX OF   .  GREEN[place] FILTER INSERTION, HX OF   .  Wheelchair dependence   .  Leg swelling   .  Chronic right shoulder pain   .  Ankle arthritis   .  Candida albicans infection   .  Thrush   .  Skin pustule(s)   .  Tachycardia   .  Skin tear   .  COPD exacerbation    Past Medical History: Diagnosis  Date   .  Anxiety     .  Arthritis     .  COPD (chronic obstructive pulmonary disease)     .  GERD (gastroesophageal reflux disease)     .  Hyperlipidemia     .  Osteoporosis     .  DVT (deep venous thrombosis)     .  PE (pulmonary thromboembolism)     .  Hypertension          PT DENIES....ON NO  MEDS   .  Diabetes mellitus     .  Cancer  1969       cervical   .  Pneumonia         h/o   .  Bronchitis  h/o   .  Blood transfusion     .  Anemia     .  Bipolar affect, depressed        Past Surgical History: Procedure  Date   .  Vena cava filter placement     .  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  Eye surgery         CATARACTS   .  Achilles tendon lengthening  11/18/11       and repair w/posterior tibial tendon lengthening; right  foot   .  Cholecystectomy     .  Abdominal hysterectomy  1969       "womb taken out for cervical cancer"   .  Cataract extraction, bilateral  ~ 2008   .  Tonsillectomy         "as a child"   .  Achilles tendon surgery  11/18/2011       Procedure: ACHILLES TENDON REPAIR;  Surgeon: Toni Arthurs, MD;  Location: [facility] OR;  Service: Orthopedics;  Laterality: Right;  Right Posterior Tibial Tendon Lenghtening and Tendon Achilles Lenghtening       Social History: Living arrangements - the patient lives in an assisted living facility     Social History   .  Marital Status:  Divorced       Spouse Name:  N/A       Number of Children:  N/A   .  Years of Education:  N/A       Social History Main Topics   .  Smoking status:  Former Smoker -- 1.0 packs/day       Types:  Cigarettes       Quit date:  11/14/1986   .   Smokeless tobacco:  Never Used   .  Alcohol Use:  No   .  Drug Use:  No   .  Sexually Active:  No    Family History: No family history on file.   Allergies: Allergen  Reactions   .  Morphine And Related  Other (See Comments)       Pain dysregulation  Mood disruption   .  Sulfamethoxazole W/Trimethoprim  Hives   .  Zyprexa         Hallucinations and disorientation          Current Outpatient Prescriptions on File Prior to Encounter   Medication  Sig  Dispense  Refill   .  acetaminophen (TYLENOL) 325 MG tablet  Take 2 tablets (650 mg total) by mouth every 6 (six) hours as needed for pain.   30 tablet   0   .  ALPRAZolam (XANAX) 1 MG tablet  Take 1 mg by mouth 3 (three) times daily.         .  divalproex (DEPAKOTE ER) 250 MG 24 hr tablet  Take 750 mg by mouth 2 (two) times daily.          Marland Kitchen  ENSURE (ENSURE)  Take 1 Can by mouth 3 (three) times daily with meals.           .  Fluticasone-Salmeterol (ADVAIR DISKUS) 500-50 MCG/DOSE AEPB  Inhale 1 puff into the lungs 2 (two) times daily.   60 each   11   .  gabapentin (NEURONTIN) 300 MG capsule  Take 3 capsules (900 mg total) by mouth 3 (three) times daily.   270 capsule   3   .  levalbuterol (XOPENEX HFA) 45 MCG/ACT inhaler  Inhale 2 puffs into the lungs every 4 (four) hours as needed for wheezing.   1 Inhaler   1   .  metFORMIN (GLUCOPHAGE) 1000 MG tablet  Take 1 tablet (1,000 mg total) by mouth 2 (two) times daily with a meal.   64 tablet   11   .  metoprolol tartrate (LOPRESSOR) 25 MG tablet  Take 0.5 tablets (12.5 mg total) by mouth 2 (two) times daily.   30 tablet   11   .  nystatin-triamcinolone (MYCOLOG II) cream  Apply 1 application topically 4 (four) times daily.           Marland Kitchen  omeprazole (PRILOSEC) 20 MG capsule  Take 20 mg by mouth daily.         .  polyethylene glycol (MIRALAX / GLYCOLAX) packet  Take 17 g by mouth daily.           .  potassium chloride (K-DUR,KLOR-CON) 10 MEQ tablet  Take 5-10 mEq by mouth 2 (two) times daily.  Takes in the am and 5 meq at bedtime         .  predniSONE (DELTASONE) 50 MG tablet  Take 1 tablet (50 mg total) by mouth daily.   5 tablet   0   .  promethazine (PHENERGAN) 25 MG tablet  Take 25 mg by mouth every 6 (six) hours as needed. nausea         .  tiotropium (SPIRIVA) 18 MCG inhalation capsule  Place 1 capsule (18 mcg total) into inhaler and inhale daily.   30 capsule   11   .  torsemide (DEMADEX) 10 MG tablet  Take 2 tablets (20 mg total) by mouth 2 (two) times daily as needed (lower extremity swelling). Takes 2 tablets every morning for 20mg  dosage and takes 1 tablet at night for 10mg  dosage   30 tablet   3   .  venlafaxine (EFFEXOR-XR) 150 MG 24 hr capsule  Take 2 capsules (300 mg total) by mouth at bedtime.   60 capsule   3   .  vitamin C (ASCORBIC ACID) 500 MG tablet  Take 500 mg by mouth 2 (two) times daily.         .  chlorproMAZINE (THORAZINE) 25 MG tablet  Take 1 tablet (25 mg total) by mouth 3 (three) times daily.   90 tablet   3   .  QUEtiapine (SEROQUEL) 25 MG tablet  Take 1 tablet (25 mg total) by mouth at bedtime.   30 tablet   3   .  traZODone (DESYREL) 50 MG tablet  Take 1 tablet (50 mg total) by mouth at bedtime.   30 tablet   3        Physical Exam: BP 139/84  Pulse 85  Temp(Src) 97.8 F (36.6 C) (Oral)  Resp 21  Wt 194 lb 14.2 oz (88.4 kg)  SpO2 97% General: alert, appears stated age, moderate distress and mildly obese HEENT: PERRLA, extra ocular movement intact, sclera clear, anicteric and OP dry; Erythema and yellow film on tonsils and uvula Heart: Distant heart sounds, no JVD; no hepatojugular reflex   Lungs: bibasilar rales Abdomen: abdomen is soft without significant tenderness, masses, organomegaly or guarding Extremities: edema 3+ bilaterally and venous stasis dermatitis noted; exquisite tenderness to palpation Skin: upper extremity ecchymoses   Neurology: normal without focal findings Psych: mildly agitated, persistently asking for  Xanax   Labs and Imaging:  Results for orders placed during the hospital encounter of 04/01/12 (from the past 24 hour(s))   URINALYSIS, ROUTINE W REFLEX MICROSCOPIC     Status: Normal     Collection Time     04/01/12  9:06 PM       Component  Value  Range     Color, Urine  YELLOW   YELLOW      APPearance  CLEAR   CLEAR      Specific Gravity, Urine  1.010   1.005 - 1.030      pH  6.0   5.0 - 8.0      Glucose, UA  NEGATIVE   NEGATIVE (mg/dL)     Hgb urine dipstick  NEGATIVE   NEGATIVE      Bilirubin Urine  NEGATIVE   NEGATIVE      Ketones, ur  NEGATIVE   NEGATIVE (mg/dL)     Protein, ur  NEGATIVE   NEGATIVE (mg/dL)     Urobilinogen, UA  0.2   0.0 - 1.0 (mg/dL)     Nitrite  NEGATIVE   NEGATIVE      Leukocytes, UA  NEGATIVE   NEGATIVE    CBC     Status: Normal     Collection Time     04/01/12  9:59 PM       Component  Value  Range     WBC  6.2   4.0 - 10.5 (K/uL)     RBC  4.16   3.87 - 5.11 (MIL/uL)     Hemoglobin  12.5   12.0 - 15.0 (g/dL)     HCT  40.9   81.1 - 46.0 (%)     MCV  95.4   78.0 - 100.0 (fL)     MCH  30.0   26.0 - 34.0 (pg)     MCHC  31.5   30.0 - 36.0 (g/dL)     RDW  91.4   78.2 - 15.5 (%)     Platelets  191   150 - 400 (K/uL)   DIFFERENTIAL     Status: Abnormal     Collection Time     04/01/12  9:59 PM       Component  Value  Range     Neutrophils Relative  42 (*)  43 - 77 (%)     Neutro Abs  2.6   1.7 - 7.7 (K/uL)     Lymphocytes Relative  42   12 - 46 (%)     Lymphs Abs  2.6   0.7 - 4.0 (K/uL)     Monocytes Relative  12   3 - 12 (%)     Monocytes Absolute  0.8   0.1 - 1.0 (K/uL)     Eosinophils Relative  3   0 - 5 (%)     Eosinophils Absolute  0.2   0.0 - 0.7 (K/uL)     Basophils Relative  1   0 - 1 (%)     Basophils Absolute  0.0   0.0 - 0.1 (K/uL)   BASIC METABOLIC PANEL     Status: Abnormal     Collection Time     04/01/12  9:59 PM       Component  Value  Range     Sodium  138   135 - 145 (mEq/L)     Potassium  4.2   3.5 - 5.1 (mEq/L)     Chloride   95 (*)  96 -  112 (mEq/L)     CO2  36 (*)  19 - 32 (mEq/L)     Glucose, Bld  142 (*)  70 - 99 (mg/dL)     BUN  11   6 - 23 (mg/dL)     Creatinine, Ser  4.09   0.50 - 1.10 (mg/dL)     Calcium  9.1   8.4 - 10.5 (mg/dL)     GFR calc non Af Amer  >90   >90 (mL/min)     GFR calc Af Amer  >90   >90 (mL/min)   PRO B NATRIURETIC PEPTIDE     Status: Abnormal     Collection Time     04/01/12  9:59 PM       Component  Value  Range     Pro B Natriuretic peptide (BNP)  1023.0 (*)  0 - 125 (pg/mL)        Assessment and Plan: ANKITA NEWCOMER is a 71 y.o. year old female presenting with bilateral lower extremity edema and pain that is possibly related to a CHF exacerbation.    1. Bilateral Leg Edema - cause is venous insufficiency vs CHF or combination of both; Although an echo was normal 2 months ago, the Pro-BNP has doubled since then, given concern for right-sided CHF since no dyspnea or increased O2 requirement       - Start Lasix 40 mg IV BID             - repeat Echo             - strict I/O             - pain control wit Tylenol and oxycodone 10 mg for breakthrough and home neurontin   2. Psych - continue home meds of thorazine, seroquel, depakote, effexor, trazadone, xanax; hold buspar for now as unclear whether patient was taking this at home   3. COPD - continue O2, advair, and PRN levalbuterol   4. DM - hold home metformin, using moderate sliding scale   5. FENGI - SLIV; carb modified diet   6. PPX - Lovenox 40mg  daily   7. Disposition - Admit to [facility] Medicine Teaching Service under Dr. Mauricio Po; consider d/c when stable    Roslynn Amble MD   R3 Addendum 04/02/2012 Edd Arbour, MD   Patient seen and examined seperately from Dr. Clinton Sawyer. I agree with his note above.   Briefly: 71 y/o female with extensive psychiatric history who went to [facility] ed for worsening leg pain. Found to have slightly worsened CHF with mild exacerbation. BNP 1000, mild pulmonary congestion  on cxray. She has pain in both her lower extremities from venous stasis dermatitis.     Filed Vitals:     04/01/12 2300  04/01/12 2330  04/02/12 0030  04/02/12 0213   BP:  105/51  105/52  112/55  139/84   Pulse:        85   Temp:        97.8 F (36.6 C)   TempSrc:        Oral   Resp:        21   Weight:        194 lb 14.2 oz (88.4 kg)   SpO2:        97%    General: alert, appears stated age, moderate distress and mildly obese  Heart - Regular rate and rhythm.  No murmurs, gallops or rubs.  Lungs: bibasilar rales  Abdomen: soft and non-tender without masses, organomegaly or hernias noted.  No guarding or rebound Extremities: edema 3+ bilaterally and venous stasis dermatitis noted, tender to palpation bilaterally, negative homen's Skin: upper extremity ecchymoses   Neurology: normal without focal findings. Some dysarthria 2nd to dentures out.    A/P: 71 y/o c/f presenting with bilateral lower extremity edema and pain 2nd to CHF fluid accumulation and venous stasis.    1. Bilateral Leg Edema - cause is venous insufficiency and worsening CHF; Although an echo was normal  months ago, the Pro-BNP has doubled since then, given concern for right-sided CHF since no dyspnea or increased O2 requirement. Do not think this is cellulitis because it is bilateral, but does have a hot red appearance.              - Start Lasix 40 mg IV BID             - repeat Echo             - strict I/O             - pain control wit Tylenol and oxycodone 10 mg for breakthrough and home neurontin   2. Psych - continue home meds of thorazine, seroquel, depakote, effexor, trazadone, xanax; hold buspar for now as unclear whether patient was taking this at home.   - Patient is known to call out and be belligerent with the nursing staff. She was yelling this morning when I was on the floor. We had this issue with her last admission.    3. COPD - continue O2, advair, and PRN levalbuterol   4. DM - hold home  metformin, using moderate sliding scale   5. FENGI - SLIV; carb modified diet   6. PPX - Lovenox 40mg  daily   7. Disposition - Patient can be discharged after some diuresis. I do not think her leg pain will resolve quickly.    Edd Arbour, MD 04/02/2012   FMTS Attending Admission Note   Patient seen and examined by me, known from prior admission as well.  She complains of pain and increased swelling in legs, which supercedes complaints of dyspnea.  She has been living in ALF since last admission.  She has had chronic complaints of bilateral leg pain.  I agree with plan for diuresis, consider Unna boot to both legs to help with leg edema. Of note, she had been weaned from opioids on last admission, which appear to have been reinstated.  I do not have strong feelings about making acute changes in her home medication regimen at this point.   Paula Compton, MD

## 2012-04-02 NOTE — Progress Notes (Signed)
Materials management is currently out of unaboots, will try again in am.

## 2012-04-03 DIAGNOSIS — I872 Venous insufficiency (chronic) (peripheral): Secondary | ICD-10-CM | POA: Diagnosis present

## 2012-04-03 LAB — URINE CULTURE: Culture  Setup Time: 201304141134

## 2012-04-03 LAB — GLUCOSE, CAPILLARY
Glucose-Capillary: 192 mg/dL — ABNORMAL HIGH (ref 70–99)
Glucose-Capillary: 184 mg/dL — ABNORMAL HIGH (ref 70–99)

## 2012-04-03 MED ORDER — TORSEMIDE 20 MG PO TABS
20.0000 mg | ORAL_TABLET | Freq: Two times a day (BID) | ORAL | Status: DC
  Administered 2012-04-03: 20 mg via ORAL
  Filled 2012-04-03 (×3): qty 1

## 2012-04-03 MED ORDER — SENNOSIDES-DOCUSATE SODIUM 8.6-50 MG PO TABS
1.0000 | ORAL_TABLET | Freq: Two times a day (BID) | ORAL | Status: DC | PRN
  Filled 2012-04-03: qty 1

## 2012-04-03 NOTE — Progress Notes (Signed)
Orthopedic Tech Progress Note Patient Details:  [name] [birth year] [id number]  Other Ortho Devices Type of Ortho Device: Roland Rack boot Ortho Device Location: unna boots x2 Ortho Device Interventions: Application   Shawnie Pons 04/03/2012, 11:28 AM

## 2012-04-03 NOTE — Progress Notes (Signed)
Clinical Child psychotherapist (CSW) visited pt room and informed pt and pt sister Maud Deed that CSW will facilitate with dc when pt stable. Pt and sister appreciative of CSW visit.  Theresia Bough, MSW, Theresia Majors [phone]

## 2012-04-03 NOTE — Progress Notes (Signed)
  Echocardiogram 2D Echocardiogram has been performed.  Ray Glacken, Real Cons 04/03/2012, 11:02 AM

## 2012-04-03 NOTE — Progress Notes (Signed)
Daily Progress Note [name]. [name], M.D., M.B.A  Family Medicine PGY-1 Pager [phone]  Subjective: Pain - persistent, no relieved by oxycodone, would like something stronger Breathing - denies dyspnea, but is now on 5L of O2, which is greater than the 2L which she came on, not records of desats however Per nursing, the patient was mildly obtunded last night, so Seroquel and Ativan not given at bedtime   Objective: Vital signs in last 24 hours: Temp:  [97.6 F (36.4 C)-98.3 F (36.8 C)] 98.3 F (36.8 C) (04/14 2200) Pulse Rate:  [85-102] 86  (04/14 2200) Resp:  [20-21] 20  (04/14 2200) BP: (118-141)/(70-84) 118/70 mmHg (04/14 2200) SpO2:  [96 %-98 %] 98 % (04/14 2200) Weight:  [194 lb 14.2 oz (88.4 kg)] 194 lb 14.2 oz (88.4 kg) (04/14 0213) Weight change:  Last BM Date: 04/01/12  Intake/Output from previous day: 04/14 0701 - 04/15 0700 In: 240 [P.O.:240] Out: 1850 [Urine:1850] Intake/Output this shift:    General: alert, appears stated age, moderate distress and mildly obese  Heart - Regular rate and rhythm. No murmurs, gallops or rubs.  Lungs: bibasilar rales  Abdomen: soft and non-tender without masses, organomegaly or hernias noted. No guarding or rebound  Extremities: edema 3+ bilaterally and improving venous stasis dermatitis, tender to palpation bilaterally Skin: upper extremity ecchymoses  Neurology: normal without focal findings. Some dysarthria 2nd to dentures out   Lab Results:  [facility] 04/01/12 2159  WBC 6.2  HGB 12.5  HCT 39.7  PLT 191   BMET  Basename 04/02/12 0720 04/01/12 2159  NA 134* 138  K 4.3 4.2  CL 93* 95*  CO2 33* 36*  GLUCOSE 184* 142*  BUN 11 11  CREATININE 0.48* 0.51  CALCIUM 8.9 9.1   CBG (last 3)   Basename 04/02/12 2159 04/02/12 1621 04/02/12 1153  GLUCAP 124* 272* 195*      Studies/Results: Dg Chest 2 View  04/01/2012  *RADIOLOGY REPORT*  Clinical Data: Leg swelling  CHEST - 2 VIEW  Comparison: 01/20/2012   Findings: Increased interstitial markings, suspicious for mild interstitial edema. No pleural lesion.  No pneumothorax.  The heart is normal in size.  Degenerative changes of the visualized thoracolumbar spine.  IMPRESSION: Suspected mild interstitial edema.  Original Report Authenticated By: Charline Bills, M.D.    Medications: I have reviewed the patient's current medications.  Assessment/Plan: 71 year old with worsening bilateral lower extremity edema and elevated pro-BNP > 1000.   1. Bilateral Leg Edema - cause is venous insufficiency vs CHF or combination of both;  - Place unna boots bilaterally; continue Lasix 40 mg IV BID  - f/u Echo  - strict I/O (net - 2,510 mL since admission)  - pain control wit Tylenol and oxycodone 10 mg for breakthrough and home neurontin   2. Psych - continue home meds of thorazine, seroquel, depakote, effexor, trazadone, xanax; consider removing Seroquel if patient continues to be sedated at night   3. COPD - try to wean O2; continue advair, and PRN levalbuterol   4. DM - hold home metformin, using moderate sliding scale   5. FENGI - SLIV; carb modified diet   6. PPX - Lovenox 40mg  daily   7. Disposition - d/c to [facility] when clinically stable     LOS: 2 days   Mat Carne 04/03/2012, 1:56 AM

## 2012-04-03 NOTE — Discharge Summary (Signed)
Physician Discharge Summary  Patient ID: [name] MRN: [id number] DOB/AGE: [birth year] 71 y.o.  Admit date: 04/01/2012 Discharge date: 04/04/2012  PCP: Ellery Plunk, MD   Admission Diagnoses: Bilateral Lower Extremity Edema Venous Stasis Dermatitis   Discharge Diagnoses:  Principal Problem:  *Venous stasis dermatitis Active Problems:  DIABETES MELLITUS, TYPE II  BIPOLAR DISORDER  ANXIETY  COR PULMONALE  COPD   Discharged Condition: good  Hospital Course:  Mrs. Reise is a 71 year old female with chronic venous stasis who presented with worsening swelling in her lower extremities and an elevated pro-BNP concerning for CHF.   1. Bilateral Lower Extremity Edema: This problem is persistent and was recently treated with [employer]. She also was discharged on torsemide 20 mg daily for swelling, but it was not clear that she had been taking the diuretic prior to admission. Upon admission, her pro-BMP was greater than 1,000, which is twice the amount from two months ago. Therefore, we were concerned that venous stasis and possible right-sided CHF were contributing to the swelling. An echocardiogram demonstrated that she did not have heart failure (EF 55-65%). Therefore, her venous stasis was treated with furosemide 40 mg BID for diuresis and unna boots were also placed. She will follow-up with Dr. Hulen Luster in one week for follow-up.    2. Psych: The patient was continued on her home regimen of Depakote 750 mg q 24, Thorazine 25 mg TID, Seroquel 25 mg nightly, Effexor 300 mg q 24 , Trazodone 300 mg nightly, and Xanax. Upon admission she was started on Xanax 0.5 mg QID, which is was she was discharged on after her last admission for this problem in February. However, the patient claimed that her physician at [facility] increased the dose to 1 mg QID. Therefore, she was given this amount to quell her agitation. Given that she has been receiving opioids through the physician at her SNF,  the [facility] will no longer be prescribing this medication for the patient. This will prevent the patient from obtaining multiple prescriptions for a controlled substance that could be potentially harmful for the patient.    3. COPD - Her respiratory status was stable throughout her hospitalization. Therefore, she was maintained on her home medications of tiotropium and fluticasone-salmeterol. She did not require any levalbuterol PRN.   4. Diabetes Mellitus - Her home metformin was held in favor of sliding scale insulin. The metformin was restarted upon discharge.  CBG (last 3)   Basename 04/04/12 0740 04/03/12 2040 04/03/12 1636  GLUCAP 165* 184* 168*    Consults: None  Significant Diagnostic Studies:  Pro BNP: 1023  *RADIOLOGY REPORT*  Clinical Data: Leg swelling  CHEST - 2 VIEW  Comparison: 01/20/2012 .  IMPRESSION:  Suspected mild interstitial edema.  Echocardiogram: normal LV function; EF 55-65%; mildly dilated left atrium   Discharge Exam: Blood pressure 141/78, pulse 104, temperature 97.5 F (36.4 C), temperature source Oral, resp. rate 20, weight 194 lb 14.2 oz (88.4 kg), SpO2 96.00%. General: alert, appears stated age, moderate distress and mildly obese  Heart - Regular rate and rhythm. No murmurs, gallops or rubs.  Lungs: bibasilar rales  Abdomen: soft and non-tender without masses, organomegaly or hernias noted. No guarding or rebound  Extremities: legs wrapped in Unna dressing distal to knee bilaterally  Skin: upper extremity ecchymoses  Neurology: normal without focal findings   Disposition: 03-Skilled Nursing Facility  Discharge Orders    Future Appointments: Provider: Department: Dept Phone: Center:  04/11/2012 2:00 PM Reginold Agent, MD [facility] [phone] [facility]     Future Orders Please Complete By Expires   Discharge to SNF when bed available        Medication List  As of 04/04/2012 11:32 AM   STOP taking these medications          busPIRone 10 MG tablet      predniSONE 50 MG tablet         TAKE these medications         acetaminophen 325 MG tablet   Commonly known as: TYLENOL   Take 2 tablets (650 mg total) by mouth every 6 (six) hours as needed for pain.      ALPRAZolam 1 MG tablet   Commonly known as: XANAX   Take 1 mg by mouth 3 (three) times daily.      chlorproMAZINE 25 MG tablet   Commonly known as: THORAZINE   Take 1 tablet (25 mg total) by mouth 3 (three) times daily.      divalproex 250 MG 24 hr tablet   Commonly known as: DEPAKOTE ER   Take 750 mg by mouth 2 (two) times daily.      ENSURE   Take 1 Can by mouth 3 (three) times daily with meals.      Fluticasone-Salmeterol 500-50 MCG/DOSE Aepb   Commonly known as: ADVAIR   Inhale 1 puff into the lungs 2 (two) times daily.      gabapentin 300 MG capsule   Commonly known as: NEURONTIN   Take 3 capsules (900 mg total) by mouth 3 (three) times daily.      levalbuterol 45 MCG/ACT inhaler   Commonly known as: XOPENEX HFA   Inhale 2 puffs into the lungs every 4 (four) hours as needed for wheezing.      metFORMIN 1000 MG tablet   Commonly known as: GLUCOPHAGE   Take 1 tablet (1,000 mg total) by mouth 2 (two) times daily with a meal.      metoprolol tartrate 25 MG tablet   Commonly known as: LOPRESSOR   Take 0.5 tablets (12.5 mg total) by mouth 2 (two) times daily.      nystatin-triamcinolone cream   Commonly known as: MYCOLOG II   Apply 1 application topically 4 (four) times daily.      omeprazole 20 MG capsule   Commonly known as: PRILOSEC   Take 20 mg by mouth daily.      polyethylene glycol packet   Commonly known as: MIRALAX / GLYCOLAX   Take 17 g by mouth daily.      potassium chloride 10 MEQ tablet   Commonly known as: K-DUR,KLOR-CON   Take 5-10 mEq by mouth 2 (two) times daily. Takes in the am and 5 meq at bedtime      promethazine 25 MG tablet   Commonly known as: PHENERGAN   Take 25 mg by mouth every 6  (six) hours as needed. nausea      QUEtiapine 25 MG tablet   Commonly known as: SEROQUEL   Take 1 tablet (25 mg total) by mouth at bedtime.      tiotropium 18 MCG inhalation capsule   Commonly known as: SPIRIVA   Place 1 capsule (18 mcg total) into inhaler and inhale daily.      torsemide 10 MG tablet   Commonly known as: DEMADEX   Take 2 tablets (20 mg total) by mouth daily.      traZODone 50  MG tablet   Commonly known as: DESYREL   Take 1 tablet (50 mg total) by mouth at bedtime.      venlafaxine XR 150 MG 24 hr capsule   Commonly known as: EFFEXOR-XR   Take 2 capsules (300 mg total) by mouth at bedtime.      vitamin C 500 MG tablet   Commonly known as: ASCORBIC ACID   Take 500 mg by mouth 2 (two) times daily.           Follow-up Information    Follow up with Ellery Plunk, MD on 04/11/2012. (Appointment at 2:00 PM)          Signed: Mat Carne 04/04/2012, 11:32 AM

## 2012-04-03 NOTE — Progress Notes (Signed)
Family Medicine Teaching Service Attending Note  I interviewed and examined patient [name] and reviewed their tests and x-rays.  I discussed with Dr. Clinton Sawyer and reviewed their note for today.  I agree with their assessment and plan.     Additionally  Stable Edema improving For echo Seems to not be intravascularly overload - change to oral diuretics.  If not systolic dysfunction on echo the low dose diuretics and control swelling with elevation and compression

## 2012-04-04 LAB — URINE MICROSCOPIC-ADD ON

## 2012-04-04 LAB — GLUCOSE, CAPILLARY: Glucose-Capillary: 168 mg/dL — ABNORMAL HIGH (ref 70–99)

## 2012-04-04 LAB — URINALYSIS, ROUTINE W REFLEX MICROSCOPIC
Glucose, UA: 100 mg/dL — AB
Ketones, ur: 15 mg/dL — AB
Protein, ur: >300 mg/dL — AB
pH: 6.5 (ref 5.0–8.0)

## 2012-04-04 MED ORDER — TORSEMIDE 10 MG PO TABS: 20.0000 mg | ORAL_TABLET | Freq: Every day | ORAL | Status: DC

## 2012-04-04 NOTE — Progress Notes (Signed)
Clinical Child psychotherapist (CSW) informed pt ready for dc back to [facility]. CSW submitted pt dc summary to facility, prepared and placed dc packet in pt wall-a-roo and contacted [facility] for a 2pm pick up. Pt aware and agreeable to transport. No further CSW concerns addressed. CSW sigining off.  Theresia Bough, MSW, Theresia Majors [phone]

## 2012-04-04 NOTE — Discharge Summary (Signed)
I have reviewed this discharge summary and agree.    

## 2012-04-04 NOTE — Progress Notes (Signed)
Transferred per bed awake alert oriented, on room air, with peripheral IV on right arm- saline locked, with bandage dressing on both lower leg clean dry intact. Report given by night shift nurse. Transferred accordingly.

## 2012-04-05 ENCOUNTER — Ambulatory Visit: Payer: Medicare Other | Admitting: Family Medicine

## 2012-04-11 ENCOUNTER — Inpatient Hospital Stay: Payer: Medicare Other | Admitting: Family Medicine

## 2012-05-04 IMAGING — CR DG CHEST 2V
2 series · 2 of 2 positions shown · non-contrast
Comparison: 01/20/2012

CLINICAL DATA: Leg swelling

CHEST - 2 VIEW

[x chest ap]
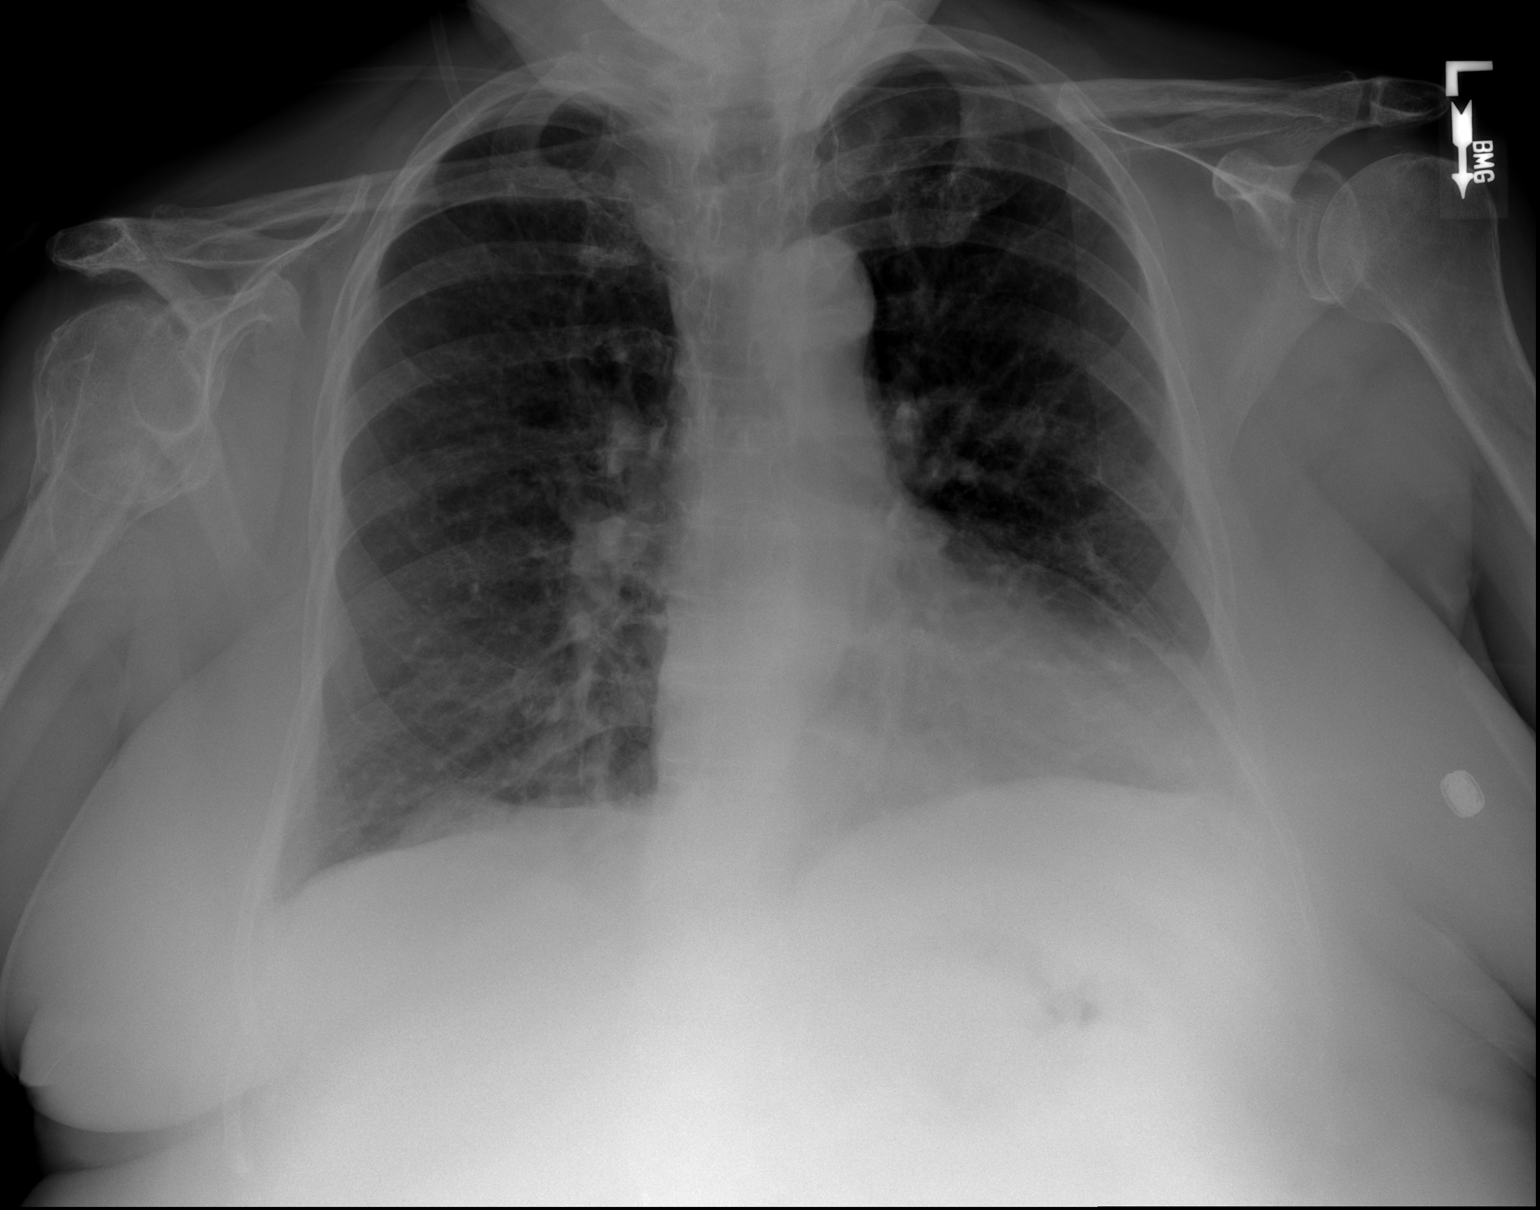

[w chest lat]
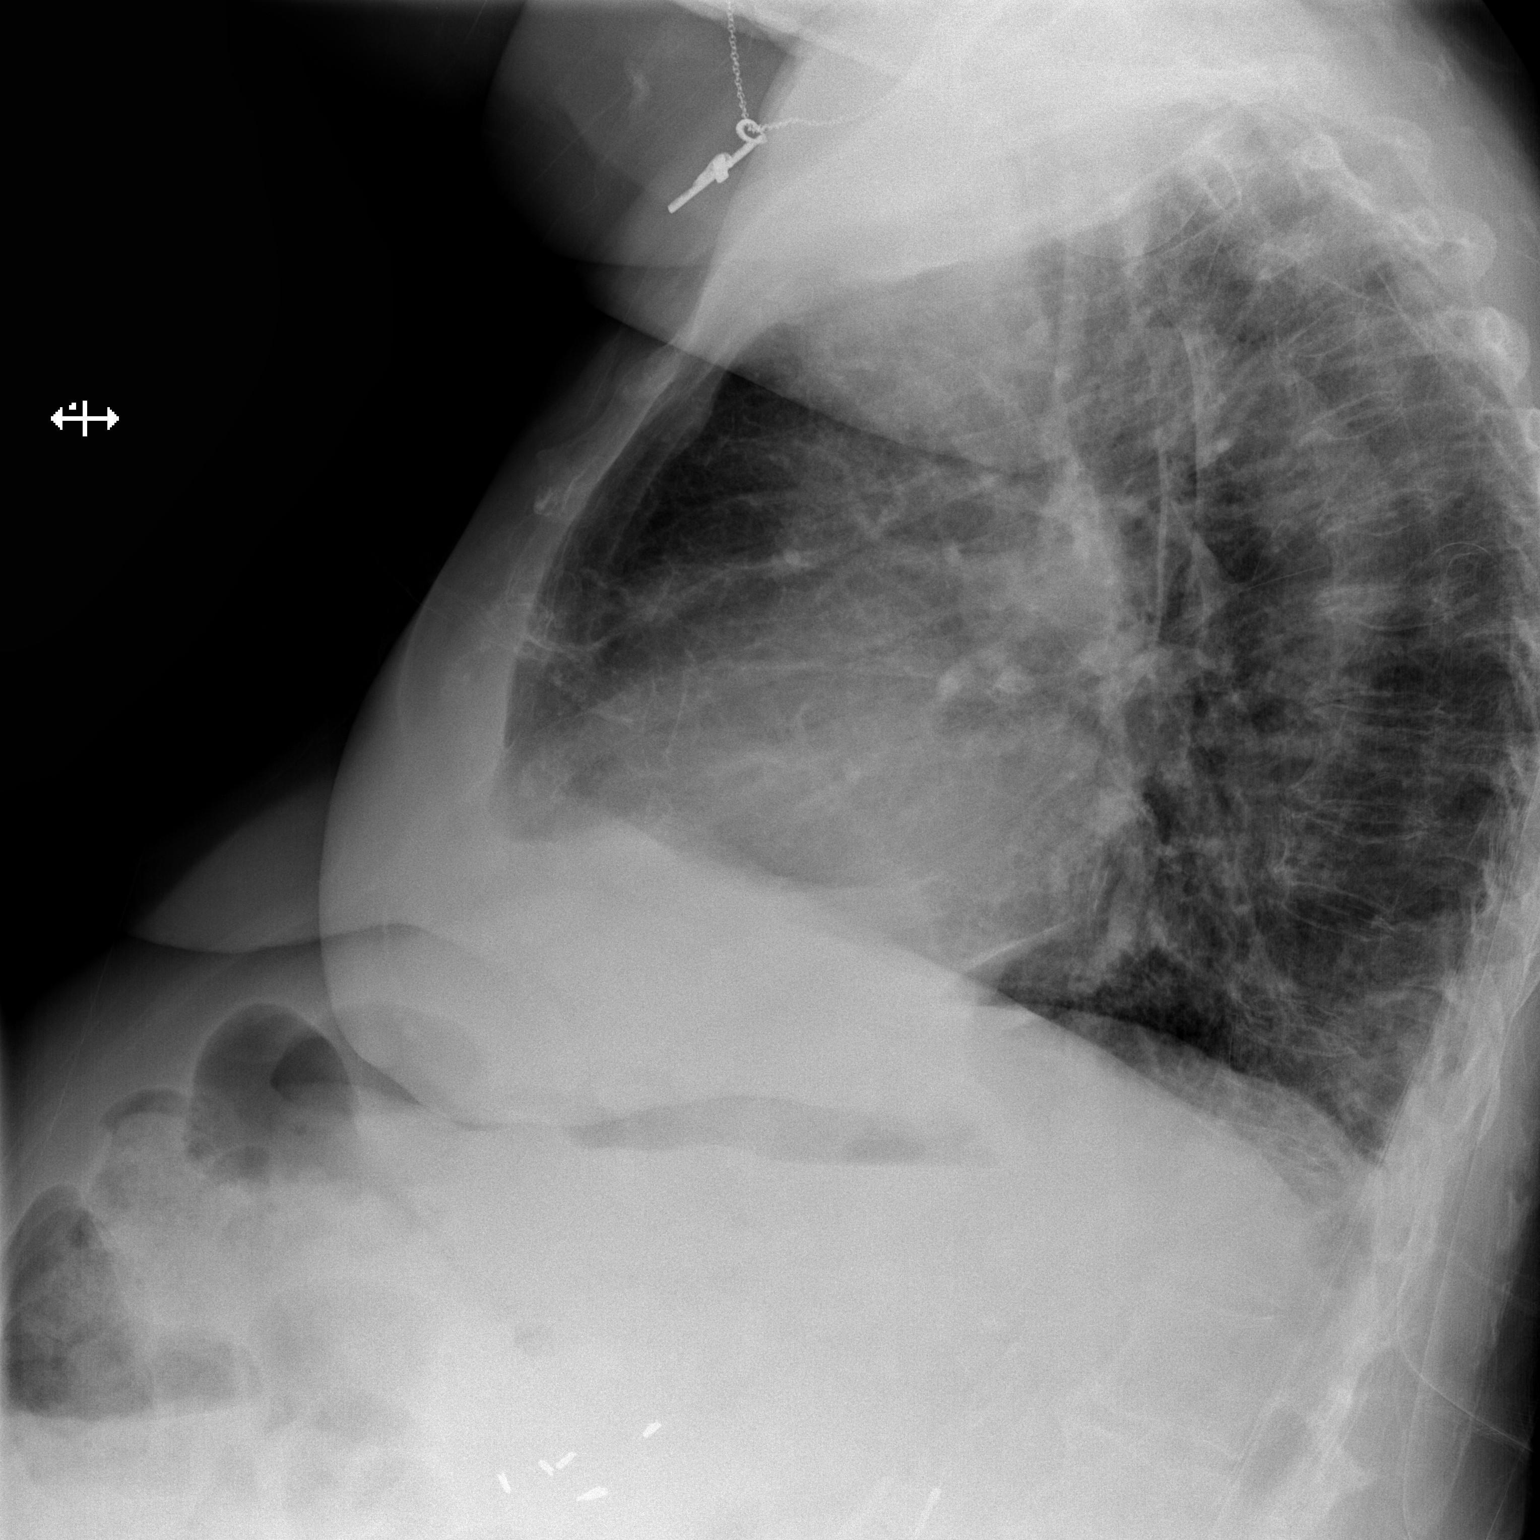

[2 of 2 positions shown; findings below may reference images not displayed]

FINDINGS: Increased interstitial markings, suspicious for mild
interstitial edema. No pleural lesion.  No pneumothorax.

The heart is normal in size.

Degenerative changes of the visualized thoracolumbar spine.
IMPRESSION: Suspected mild interstitial edema.

## 2012-05-30 ENCOUNTER — Encounter: Payer: Self-pay | Admitting: Family Medicine

## 2012-07-06 ENCOUNTER — Other Ambulatory Visit (HOSPITAL_COMMUNITY): Payer: Self-pay | Admitting: Nurse Practitioner

## 2012-07-06 ENCOUNTER — Ambulatory Visit (HOSPITAL_COMMUNITY)
Admission: RE | Admit: 2012-07-06 | Discharge: 2012-07-06 | Disposition: A | Discharge status: Home / Self Care | Payer: Medicare Other | Source: Ambulatory Visit | Attending: Internal Medicine | Admitting: Internal Medicine

## 2012-07-06 DIAGNOSIS — T17308A Unspecified foreign body in larynx causing other injury, initial encounter: Secondary | ICD-10-CM

## 2012-07-06 DIAGNOSIS — IMO0002 Reserved for concepts with insufficient information to code with codable children: Secondary | ICD-10-CM | POA: Insufficient documentation

## 2012-07-06 NOTE — Procedures (Signed)
Objective Swallowing Evaluation: Modified Barium Swallowing Study  Patient Details  Name: [name] MRN: [id number] Date of Birth: 1941-07-15  Today's Date: 07/06/2012 Time: 1003-1038 SLP Time Calculation (min): 35 min  Past Medical History:  Past Medical History  Diagnosis Date  . Anxiety   . Arthritis   . COPD (chronic obstructive pulmonary disease)   . GERD (gastroesophageal reflux disease)   . Hyperlipidemia   . Osteoporosis   . DVT (deep venous thrombosis)   . PE (pulmonary thromboembolism)   . Hypertension      PT DENIES....ON NO  MEDS  . Diabetes mellitus   . Cancer 1969    cervical  . Pneumonia     h/o  . Bronchitis     h/o  . Blood transfusion   . Anemia   . Bipolar affect, depressed    Past Surgical History:  Past Surgical History  Procedure Date  . Vena cava filter placement   . 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 Eye surgery     CATARACTS  . Achilles tendon lengthening 11/18/11    and repair w/posterior tibial tendon lengthening; right  foot  . Cholecystectomy   . Abdominal hysterectomy 1969    "womb taken out for cervical cancer"  . Cataract extraction, bilateral ~ 2008  . Tonsillectomy     "as a child"  . Achilles tendon surgery 11/18/2011    Procedure: ACHILLES TENDON REPAIR;  Surgeon: Toni Arthurs, MD;  Location: [facility] OR;  Service: Orthopedics;  Laterality: Right;  Right Posterior Tibial Tendon Lenghtening and Tendon Achilles Lenghtening    HPI:  71 yo female resident of [facility] referred for MBS due to pt having frequent choking episodes, pt reports mostly on meat.  Pt PMH (per hospital d/c summary 03/03/2007) includes Cspine C6 fracture in 2008, pna, bipolar disorder, ecoli UTI, anemia, DM2, severe COPD.Marland Kitchenon home oxygen at that time, chronic constipation, htn, hyperlipidemia, DVT and pulmonary embolism.  Medication list includes but not limited to:  biotene, magic mouthwash, nystatin, mylanta, miralax, immodium, depakote, seroquel,  trazodone, effexor, xanax, omeprazole.  Pt states xerostomia has worsened in the last six months but  she and her sister denied medicine or medical changes at that time.       Assessment / Plan / Recommendation Clinical Impression  Dysphagia Diagnosis: Moderate oral phase dysphagia;Moderate pharyngeal phase dysphagia Clinical impression: Pt presents with moderate oral motor based dysphagia likely due to suspected oral dyskinesia, exacerbated by severe xerostomia.  Moderate sensorimotor pharyngeal dysphagia evidenced by muscular weakness resulting in pharyngeal stasis (without adequate awareness) and silent aspiration of thin liquid via straw.  Small cup sips o thin resulted in deep penetration but fortunately approx 95% of barium penetrates was removed with same swallow during laryngeal elevation.  Cued cough cleared remainder of penetrates.  Pt sensed only severe pharyngeal stasis, requesting liquids to clear.  Liquids and dry swallows cleared approx 90% of barium, but 10% remained at vallecular space throughout evaluation.  Please note, pt's sister stated "She does not need pureed foods" and pt repeatedly stated, "I am not going to eat pureed foods" throughout evaluation.  Recommend to evaluate medicaiton list for possible source of worsening xerostomia, note pt is receiving biotene, magic mouthwash and nystatin.  Did not test chin tuck due to oral deficits and knowh h/o C6 fx.  Thanks for this referral.        Treatment Recommendation       Diet  Recommendation Thin liquid (defer to snf slp for solids recommendations)   Liquid Administration via: No straw;Cup Medication Administration:  (DNT solid tablet due to level of dysphagia) Supervision: Full supervision/cueing for compensatory strategies Compensations: Slow rate;Small sips/bites;Follow solids with liquid;Multiple dry swallows after each bite/sip;Hard cough after swallow Postural Changes and/or Swallow Maneuvers: Seated upright 90  degrees;Upright 30-60 min after meal    Other  Recommendations Oral Care Recommendations: Oral care before and after PO   Follow Up Recommendations  Skilled Nursing facility    Frequency and Duration        Pertinent Vitals/Pain Pt on room air, appeared comfortable, arrived in wheelchair    SLP Swallow Goals     General Date of Onset: 07/06/12 HPI: 71 yo female resident of [facility] referred for MBS due to pt having frequent choking episodes, pt reports mostly on meat.  Pt PMH (per hospital d/c summary 03/03/2007) includes Cspine C6 fracture in 2008, pna, bipolar disorder, ecoli UTI, anemia, DM2, severe COPD.Marland Kitchenon home oxygen at that time, chronic constipation, htn, hyperlipidemia, DVT and pulmonary embolism.  Medication list includes but not limited to:  biotene, magic mouthwash, nystatin, mylanta, miralax, immodium, depakote, seroquel, trazodone, effexor, xanax, omeprazole.  Pt states xerostomia has worsened in the last six months but  she and her sister denied medicine or medical changes at that time.   Type of Study: Modified Barium Swallowing Study Reason for Referral: Objectively evaluate swallowing function Previous Swallow Assessment: MBS February 23, 2007  aspiration of thin liquid, recommended dys2/honey diet. Diet Prior to this Study: Other (Comment) (unknown) Respiratory Status: Room air History of Recent Intubation: No Behavior/Cognition: Alert;Impulsive;Requires cueing;Distractible;Decreased sustained attention;Hard of hearing Oral Cavity - Dentition: Edentulous (says she has partial at daughter's house, but does not use) Oral Motor / Sensory Function: Impaired motor Oral impairment:  (excessive oral movement, ? oral dyskinesia) Self-Feeding Abilities: Able to feed self Patient Positioning: Upright in chair Baseline Vocal Quality: Clear Volitional Cough: Strong Volitional Swallow: Able to elicit Anatomy: Within functional limits Pharyngeal Secretions: Not observed  secondary MBS    Reason for Referral Objectively evaluate swallowing function   Oral Phase   Oral Phase: Impaired  Pharyngeal Phase Pharyngeal Phase: Impaired   Cervical Esophageal Phase Cervical Esophageal Phase: NOT Impaired     Donavan Burnet, MS [facility] SLP [phone]

## 2012-07-20 ENCOUNTER — Encounter (HOSPITAL_COMMUNITY): Payer: Medicare Other

## 2012-07-24 ENCOUNTER — Encounter (HOSPITAL_COMMUNITY): Payer: Medicare Other

## 2012-07-24 ENCOUNTER — Other Ambulatory Visit (HOSPITAL_COMMUNITY): Payer: Self-pay | Admitting: Radiology

## 2012-07-25 ENCOUNTER — Encounter (HOSPITAL_COMMUNITY): Payer: Medicare Other

## 2012-07-27 ENCOUNTER — Encounter (HOSPITAL_COMMUNITY): Payer: Medicare Other

## 2012-07-28 ENCOUNTER — Encounter (HOSPITAL_COMMUNITY): Payer: Medicare Other

## 2012-08-07 ENCOUNTER — Ambulatory Visit (HOSPITAL_COMMUNITY)
Admission: RE | Admit: 2012-08-07 | Discharge: 2012-08-07 | Disposition: A | Discharge status: Home / Self Care | Payer: Medicare Other | Source: Ambulatory Visit | Attending: Internal Medicine | Admitting: Internal Medicine

## 2012-08-07 ENCOUNTER — Other Ambulatory Visit (HOSPITAL_BASED_OUTPATIENT_CLINIC_OR_DEPARTMENT_OTHER): Payer: Self-pay | Admitting: Internal Medicine

## 2012-08-07 DIAGNOSIS — R05 Cough: Secondary | ICD-10-CM | POA: Insufficient documentation

## 2012-08-07 DIAGNOSIS — R0602 Shortness of breath: Secondary | ICD-10-CM

## 2012-08-07 DIAGNOSIS — R059 Cough, unspecified: Secondary | ICD-10-CM | POA: Insufficient documentation

## 2012-08-07 LAB — BLOOD GAS, ARTERIAL
Bicarbonate: 27.9 mEq/L — ABNORMAL HIGH (ref 20.0–24.0)
Drawn by: 33100
O2 Content: 2 L/min
pH, Arterial: 7.383 (ref 7.350–7.450)
pO2, Arterial: 83.2 mmHg (ref 80.0–100.0)

## 2012-09-09 IMAGING — CR DG CHEST 2V
1 series · 1 of 1 positions shown · non-contrast
Comparison: 04/01/2012.

CLINICAL DATA: Shortness of breath and cough.

CHEST - 2 VIEW

[view not recorded]
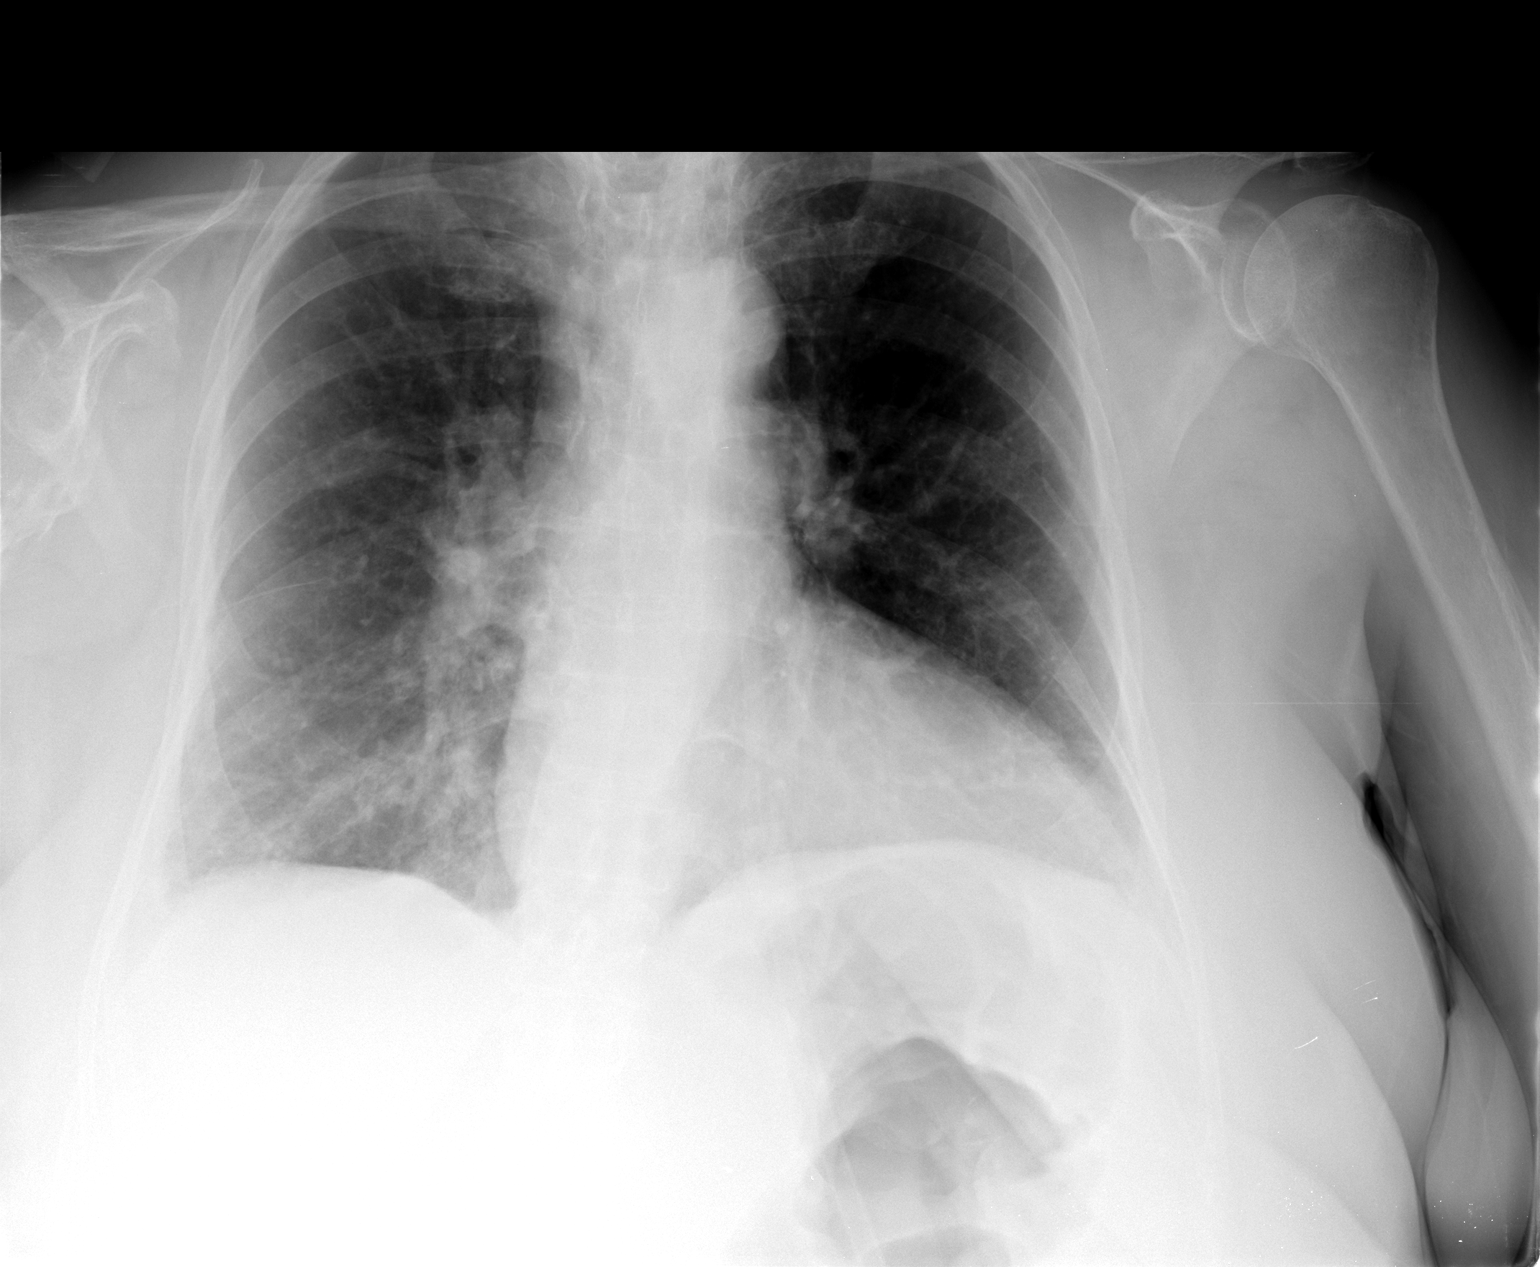

[1 of 1 positions shown; findings below may reference images not displayed]

FINDINGS: Trachea is midline.  Heart size stable.  Suspect mild air
space disease in the medial right lower lobe.  Chronic changes are
seen in the lungs.  No pleural fluid.  At least one old left rib
fracture.  Lower thoracic compression fracture is unchanged.
IMPRESSION: Probable right lower lobe air space disease, worrisome for
pneumonia

## 2013-03-12 ENCOUNTER — Non-Acute Institutional Stay (SKILLED_NURSING_FACILITY): Payer: Medicare Other | Admitting: Adult Health

## 2013-03-12 ENCOUNTER — Encounter: Payer: Self-pay | Admitting: Adult Health

## 2013-03-12 DIAGNOSIS — E118 Type 2 diabetes mellitus with unspecified complications: Secondary | ICD-10-CM

## 2013-03-12 DIAGNOSIS — R609 Edema, unspecified: Secondary | ICD-10-CM

## 2013-03-15 ENCOUNTER — Non-Acute Institutional Stay (SKILLED_NURSING_FACILITY): Payer: Medicare Other | Admitting: Internal Medicine

## 2013-03-15 DIAGNOSIS — I80299 Phlebitis and thrombophlebitis of other deep vessels of unspecified lower extremity: Secondary | ICD-10-CM

## 2013-03-19 NOTE — Progress Notes (Signed)
Subjective:    Patient ID: [name], female    DOB: February 10, 1941, 72 y.o.   MRN: [id number] Chief Complaint  Patient presents with  . Edema  . Diabetes Mellitus    HPI Her edema is worse on her left leg; she does have a history of dvt in that leg in the distant past. Her diabetes is slightly worse with an hgb a1c of 9.0 however; she has recently had her medications adjusted.   Past Medical History  Diagnosis Date  . Anxiety   . Arthritis   . COPD (chronic obstructive pulmonary disease)   . GERD (gastroesophageal reflux disease)   . Hyperlipidemia   . Osteoporosis   . DVT (deep venous thrombosis)   . PE (pulmonary thromboembolism)   . Hypertension      PT DENIES....ON NO  MEDS  . Diabetes mellitus   . Cancer 1969    cervical  . Pneumonia     h/o  . Bronchitis     h/o  . Blood transfusion   . Anemia   . Bipolar affect, depressed    Past Surgical History  Procedure Laterality Date  . Vena cava filter placement    . 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 Eye surgery      CATARACTS  . Achilles tendon lengthening  11/18/11    and repair w/posterior tibial tendon lengthening; right  foot  . Cholecystectomy    . Abdominal hysterectomy  1969    "womb taken out for cervical cancer"  . Cataract extraction, bilateral  ~ 2008  . Tonsillectomy      "as a child"  . Achilles tendon surgery  11/18/2011    Procedure: ACHILLES TENDON REPAIR;  Surgeon: Toni Arthurs, MD;  Location: [facility] OR;  Service: Orthopedics;  Laterality: Right;  Right Posterior Tibial Tendon Lenghtening and Tendon Achilles Lenghtening    Current Outpatient Prescriptions on File Prior to Visit  Medication Sig Dispense Refill  . ALPRAZolam (XANAX) 1 MG tablet Take 1 mg by mouth 4 (four) times daily.       . divalproex (DEPAKOTE ER) 250 MG 24 hr tablet Take 250 mg by mouth 2 (two) times daily.       . Fluticasone-Salmeterol (ADVAIR DISKUS) 500-50 MCG/DOSE AEPB Inhale 1 puff into the lungs 2 (two) times  daily.  60 each  11  . omeprazole (PRILOSEC) 20 MG capsule Take 20 mg by mouth daily.      . polyethylene glycol (MIRALAX / GLYCOLAX) packet Take 17 g by mouth daily.        . potassium chloride (K-DUR,KLOR-CON) 10 MEQ tablet Take 5 mEq by mouth daily. Takes in the am and 5 meq at bedtime      . promethazine (PHENERGAN) 25 MG tablet Take 25 mg by mouth every 6 (six) hours as needed. nausea      . tiotropium (SPIRIVA) 18 MCG inhalation capsule Place 1 capsule (18 mcg total) into inhaler and inhale daily.  30 capsule  11  . torsemide (DEMADEX) 10 MG tablet Take 2 tablets (20 mg total) by mouth daily.  30 tablet  3  . ENSURE (ENSURE) Take 1 Can by mouth 3 (three) times daily with meals.        . nystatin-triamcinolone (MYCOLOG II) cream Apply 1 application topically 4 (four) times daily.        . vitamin C (ASCORBIC ACID) 500 MG tablet Take 500 mg by mouth 2 (  two) times daily.       No current facility-administered medications on file prior to visit.    Filed Vitals:   03/12/13 1424  BP: 148/86  Pulse: 70  Height: 5\' 2"  (1.575 m)  Weight: 201 lb (91.173 kg)    Review of Systems  Constitutional: Negative.   Respiratory: Negative for cough and shortness of breath.   Cardiovascular: Positive for leg swelling. Negative for chest pain.  Gastrointestinal: Negative for abdominal pain and constipation.  Musculoskeletal: Negative for myalgias and arthralgias.  Skin: Negative.   Psychiatric/Behavioral: Negative.        Objective:   Physical Exam  Constitutional: She is oriented to person, place, and time. She appears well-developed and well-nourished.  Neck: Neck supple.  Cardiovascular: Normal rate, regular rhythm and intact distal pulses.   Pulmonary/Chest: Effort normal and breath sounds normal.  Abdominal: Soft. Bowel sounds are normal.  Musculoskeletal: She exhibits edema.  Has 2+ edema on left lower extremity has splints to lower extremities; full rom in upper extremities.    Neurological: She is alert and oriented to person, place, and time.  Skin: Skin is warm and dry.  Psychiatric: She has a normal mood and affect.   Lab reivewed:  03-12-13: wbc 7.9; hgb 11.8; hct 33.7; mcv 83.6; plt 264; hgb a1c 9.0       Assessment & Plan:  Will increase her Demadex to 40 mg daily will check bmp in one week; will set up a doppler of her left lower extremity for dvt; I have instructed her that she is to take a nap for one hour per day  And elevate her legs she has agreed to this plan. Will continue to monitor her status

## 2013-03-21 NOTE — Progress Notes (Signed)
Patient ID: KEA CALLAN, female   DOB: 05-10-1941, 72 y.o.   MRN: [id number]        PROGRESS NOTE  DATE:  03.27.2014    FACILITY: [facility]    LEVEL OF CARE: SNF  Acute Visit  CHIEF COMPLAINT:  Manage left lower extremity DVT.  HISTORY OF PRESENT ILLNESS:  On 03.25.2014, patient's venous doppler ultrasound of the left lower extremity showed partially occlusive DVT of the common femoral and proximal femoral vein.  She is complaining of calf pain, but denies shortness of breath or chest pain.  She is complaining of increased left lower extremity swelling.  Patient states that she had an IVC filter placed after a life-threatening bleed on anticoagulation previously.     REVIEW OF SYSTEMS:  GENERAL: no change in appetite, no fatigue, no weight changes, no fever, chills or weakness RESPIRATORY: no cough, SOB, DOE,, wheezing, hemoptysis CARDIAC: no chest pain or palpitations, increasing left lower extremity swelling and chronic lower extremity swelling GI: no abdominal pain, diarrhea, constipation, heart burn, nausea or vomiting  PHYSICAL EXAMINATION  GENERAL: morbidly obese, no acute distress EYES: conjunctivae normal, sclerae normal, normal eye lids NECK: supple, trachea midline, no neck masses, no thyroid tenderness, no thyromegaly LYMPHATICS: no LAN in the neck, no supraclavicular LAN RESPIRATORY: breathing is even & unlabored, BS CTAB CARDIAC: RRR, no murmur,no extra heart sounds.  left lower extremity has +3 edema and right lower extremity has +2 edema, pedal pulses nonpalpable. GI: abdomen soft, normal BS, no masses, no tenderness, no hepatomegaly, no splenomegaly PSYCHIATRIC: the patient is alert & oriented to person, affect & behavior appropriate   ASSESSMENT/PLAN:  Left lower extremity DVT.  Significant, new problem.  Very likely new DVT due to increased edema in the left lower extremity.    On review of medical records, per note by Dr. Leanord Hawking on 02.15.2013, she has a  history of PE with DVTs.  At that time, she was anticoagulated.  However, she had a life-threatening retroperitoneal hemorrhage and almost expired from it.  Therefore, an IVC filter was placed and anticoagulation was discontinued.  Therefore, we will discontinue Xarelto which was started on 03.25.2014.  Patient is in agreement.    CPT CODE: 40981.

## 2013-03-23 ENCOUNTER — Other Ambulatory Visit: Payer: Self-pay | Admitting: *Deleted

## 2013-03-23 MED ORDER — ALPRAZOLAM 1 MG PO TABS: 1.0000 mg | ORAL_TABLET | Freq: Four times a day (QID) | ORAL | Status: DC

## 2013-03-25 ENCOUNTER — Non-Acute Institutional Stay (SKILLED_NURSING_FACILITY): Payer: Medicare Other | Admitting: Internal Medicine

## 2013-03-25 DIAGNOSIS — M545 Low back pain, unspecified: Secondary | ICD-10-CM

## 2013-03-25 DIAGNOSIS — I82412 Acute embolism and thrombosis of left femoral vein: Secondary | ICD-10-CM

## 2013-03-25 DIAGNOSIS — M533 Sacrococcygeal disorders, not elsewhere classified: Secondary | ICD-10-CM

## 2013-03-25 DIAGNOSIS — I8010 Phlebitis and thrombophlebitis of unspecified femoral vein: Secondary | ICD-10-CM

## 2013-03-25 NOTE — Progress Notes (Signed)
Patient ID: [name], female   DOB: [birth year], 72 y.o.   MRN: [id number] Chief complaint; low back pain History; the patient today tells me that she has had a constant steady low back pain for most of the last month. This is not radiating and not particularly relieved by rest, back she states that this is worse at night and makes it difficult for her to sleep. She does have a history of chronic pain and has been on narcotics in the past in fact her narcotics have proven to be a difficult management problem. I have reviewed the radiology on [facility] and have not found any relevant imaging studies of the last 2 years.  The other issue that is, that is the patient's tendency towards venous thromboembolism. In fact she has been seen by both of my colleagues in this building with an ultrasound on 325 showing a partially occlusive DVT of the common femoral and proximal femoral vein. She was temporarily put on anticoagulants however this has been appropriately stopped. I again tried to talk to the patient about this. Her original retroperitoneal hemorrhage I believe was in the hospital at time of acute illness. This was on a combination of Lovenox transitioning to Coumadin for a DVT/PE. She had an IVC filter placed at that time which I don't believe he has ever been changed, harshly because the patient is almost incapable of motion when psychiatrically to make complex medical decision. Certainly anticoagulation in this patient would be reasonable and I have rediscussed this with her however I cannot get her to make a risk-benefit type decision.   Review of system; Respiratory-no cough, no change in her chronic shortness of breath Cardiac-no chest pain or palpitation. Increased edema GI- major constipation issues Musculoskeletal-low back pain which is lumbar/sacral. No radiation. No fever.   Physical examination; Respiratory-decreased air entry bilaterally but no wheezing and work of breathing is  normal Cardiac-heart sounds are normal no increase in JVP Abdomen- distended however no masses and no tenderness Musculoskeletal- she states there is focal tenderness over other lumbar spine all the area seems to shift from exam to exam. No obvious neurologic sequela.  Impression/plan Lumbar low back pain. Although this patient has a long history of chronic low chronic pain related issues, which has led to narcotic use, abuse. Will attempt to look into this issue. Start with plain film.  DVT; this is in the face of a chronic IVC filter. This probably needs to be changed. I will need to try to talk to her daughter about this I simply cannot get this patient to make any reasonable decisions. Nor can she I really get her to make a cost-benefit subtype analysis about ongoing anticoagulant. I think she would probably do fine however she is not willing to even talk about any risk  At some point I probably going to need to call her daughter to discuss some of this.

## 2013-03-26 ENCOUNTER — Other Ambulatory Visit (HOSPITAL_BASED_OUTPATIENT_CLINIC_OR_DEPARTMENT_OTHER): Payer: Self-pay | Admitting: Internal Medicine

## 2013-03-26 DIAGNOSIS — T148XXA Other injury of unspecified body region, initial encounter: Secondary | ICD-10-CM

## 2013-03-27 ENCOUNTER — Other Ambulatory Visit (HOSPITAL_COMMUNITY): Payer: Medicare Other

## 2013-04-04 ENCOUNTER — Non-Acute Institutional Stay (SKILLED_NURSING_FACILITY): Payer: Medicare Other | Admitting: Adult Health

## 2013-04-04 ENCOUNTER — Encounter: Payer: Self-pay | Admitting: Adult Health

## 2013-04-04 DIAGNOSIS — K219 Gastro-esophageal reflux disease without esophagitis: Secondary | ICD-10-CM

## 2013-04-04 DIAGNOSIS — E119 Type 2 diabetes mellitus without complications: Secondary | ICD-10-CM

## 2013-04-04 DIAGNOSIS — K5900 Constipation, unspecified: Secondary | ICD-10-CM | POA: Insufficient documentation

## 2013-04-04 DIAGNOSIS — F319 Bipolar disorder, unspecified: Secondary | ICD-10-CM

## 2013-04-04 DIAGNOSIS — I10 Essential (primary) hypertension: Secondary | ICD-10-CM

## 2013-04-04 DIAGNOSIS — J449 Chronic obstructive pulmonary disease, unspecified: Secondary | ICD-10-CM

## 2013-04-04 DIAGNOSIS — I279 Pulmonary heart disease, unspecified: Secondary | ICD-10-CM

## 2013-04-04 DIAGNOSIS — G4700 Insomnia, unspecified: Secondary | ICD-10-CM

## 2013-04-04 DIAGNOSIS — I82409 Acute embolism and thrombosis of unspecified deep veins of unspecified lower extremity: Secondary | ICD-10-CM

## 2013-04-04 DIAGNOSIS — E785 Hyperlipidemia, unspecified: Secondary | ICD-10-CM

## 2013-04-04 DIAGNOSIS — G8929 Other chronic pain: Secondary | ICD-10-CM

## 2013-04-04 MED ORDER — INSULIN GLARGINE 100 UNIT/ML ~~LOC~~ SOLN: 20.0000 [IU] | Freq: Every day | SUBCUTANEOUS | Status: DC

## 2013-04-04 MED ORDER — INSULIN ASPART 100 UNIT/ML ~~LOC~~ SOLN: 3.0000 [IU] | Freq: Three times a day (TID) | SUBCUTANEOUS | Status: DC

## 2013-04-04 MED ORDER — METHOCARBAMOL 500 MG PO TABS: 500.0000 mg | ORAL_TABLET | Freq: Four times a day (QID) | ORAL | Status: DC

## 2013-04-04 NOTE — Assessment & Plan Note (Signed)
She is complaining of increased lower back pain is taking vicodin 5/325 mg 2 tabs every 8 hours routinely. She states she is not getting adequate pain relief.

## 2013-04-04 NOTE — Progress Notes (Signed)
Patient ID: [name], female   DOB: Apr 23, 1941, 72 y.o.   MRN: [id number]  Chief Complaint  Patient presents with  . 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; has an ivc filter placed; and is taking asa 81 mg daily   COPD Is stable is on chronic O2 takes advair 500/50 twice daily takes xopenex 45 mcg inhalrr 2 puffs every 4 hours as needed takes spiriva daily   DIABETES MELLITUS, TYPE II Her cbg's continue to remain elevated is taking metformin 1 gm twice daily with lantus 10 units daily and novolog 5 units prior to meals for cbg >=150  Unspecified constipation She is stable is taking miralax daily takes senna s two tabs daily and daily as needed   HYPERTENSION, BENIGN SYSTEMIC Is stable is taking lopressor 12.5 mg twice daily   BIPOLAR DISORDER She is presently stable is taking depakote 250 mg twice daily to help stabilize mood; takes seroquel 50 mg in the am and 200 mg in the pm and thorazine 25 mg three times daily she has failed several times coming off the thorazine. She is having fewer episodes of yelling and acting out with anxiety in recent weeks is taking xanax  1 mg four times daily and takes effexor xr 300 mg daily   COR PULMONALE She is presently stable is taking demadex 40 mg daily and k+ 5 meq daily she has less edema in her lower extremities  INSOMNIA, CHRONIC Is stable is taking trazodone 100 mg nightly   HYPERLIPIDEMIA She is without change is taking zocor 10 mg daily takes fish oil 1 gm daily   GERD Is stable is taking prilosec 20 mg daily   Other chronic pain She is complaining of increased lower back pain is taking vicodin 5/325 mg 2 tabs every 8 hours routinely. She states she is not getting adequate pain relief.    Past Medical History  Diagnosis Date  . Anxiety   . Arthritis   . COPD (chronic obstructive pulmonary disease)   . GERD  (gastroesophageal reflux disease)   . Hyperlipidemia   . Osteoporosis   . DVT (deep venous thrombosis)   . PE (pulmonary thromboembolism)   . Hypertension      PT DENIES....ON NO  MEDS  . Diabetes mellitus   . Cancer 1969    cervical  . Pneumonia     h/o  . Bronchitis     h/o  . Blood transfusion   . Anemia   . Bipolar affect, depressed     Past Surgical History  Procedure Laterality Date  . Vena cava filter placement    . 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 Eye surgery      CATARACTS  . Achilles tendon lengthening  11/18/11    and repair w/posterior tibial tendon lengthening; right  foot  . Cholecystectomy    . Abdominal hysterectomy  1969    "womb taken out for cervical cancer"  . Cataract extraction, bilateral  ~ 2008  . Tonsillectomy      "as a child"  . Achilles tendon surgery  11/18/2011    Procedure: ACHILLES TENDON REPAIR;  Surgeon: Toni Arthurs, MD;  Location: [facility] OR;  Service: Orthopedics;  Laterality: Right;  Right Posterior Tibial Tendon Lenghtening and Tendon Achilles Lenghtening     VITAL SIGNS BP 127/77  Pulse 71  Ht 5\' 2"  (1.575 m)  Wt 200 lb (90.719 kg)  BMI 36.57 kg/m2   Patient's Medications  New Prescriptions   No medications on file  Previous Medications   ALPRAZOLAM (XANAX) 1 MG TABLET    Take 1 tablet (1 mg total) by mouth 4 (four) times daily.   ASPIRIN 81 MG TABLET    Take 81 mg by mouth daily.   CHLORPROMAZINE (THORAZINE) 25 MG TABLET    Take 25 mg by mouth 3 (three) times daily.   DIVALPROEX (DEPAKOTE ER) 250 MG 24 HR TABLET    Take 250 mg by mouth 2 (two) times daily.    FISH OIL-OMEGA-3 FATTY ACIDS 1000 MG CAPSULE    Take 1 g by mouth daily.   FLUTICASONE-SALMETEROL (ADVAIR DISKUS) 500-50 MCG/DOSE AEPB    Inhale 1 puff into the lungs 2 (two) times daily.   GABAPENTIN (NEURONTIN) 300 MG CAPSULE    Take 900 mg by mouth 3 (three) times daily.   HYDROCODONE-ACETAMINOPHEN (NORCO/VICODIN) 5-325 MG PER TABLET    Take 2 tablets by  mouth every 8 (eight) hours.   INSULIN ASPART (NOVOLOG) 100 UNIT/ML INJECTION    Inject 5 Units into the skin 3 (three) times daily before meals. For cbg >=150   INSULIN GLARGINE (LANTUS) 100 UNIT/ML INJECTION    Inject 10 Units into the skin at bedtime.   LEVALBUTEROL (XOPENEX HFA) 45 MCG/ACT INHALER    Inhale 2 puffs into the lungs every 4 (four) hours as needed for wheezing.   METFORMIN (GLUCOPHAGE) 1000 MG TABLET    Take 1,000 mg by mouth 2 (two) times daily with a meal. 1500 mg in the am and and 1 gm in the pm   METOPROLOL TARTRATE (LOPRESSOR) 25 MG TABLET    Take 12.5 mg by mouth 2 (two) times daily.   OMEPRAZOLE (PRILOSEC) 20 MG CAPSULE    Take 20 mg by mouth daily.   POLYETHYLENE GLYCOL (MIRALAX / GLYCOLAX) PACKET    Take 17 g by mouth daily.     POTASSIUM CHLORIDE (K-DUR,KLOR-CON) 10 MEQ TABLET    Take 5 mEq by mouth daily. Takes in the am and 5 meq at bedtime   PROMETHAZINE (PHENERGAN) 25 MG TABLET    Take 25 mg by mouth every 6 (six) hours as needed. nausea   QUETIAPINE (SEROQUEL) 25 MG TABLET    Take 50 mg by mouth 2 (two) times daily. Takes 200 mg nightly and 50 mg in the am   SENNOSIDES-DOCUSATE SODIUM (SENOKOT-S) 8.6-50 MG TABLET    Take 2 tablets by mouth daily. And one tab daily as needed   SIMVASTATIN (ZOCOR) 10 MG TABLET    Take 10 mg by mouth at bedtime.   TIOTROPIUM (SPIRIVA) 18 MCG INHALATION CAPSULE    Place 1 capsule (18 mcg total) into inhaler and inhale daily.   TRAZODONE (DESYREL) 50 MG TABLET    Take 100 mg by mouth at bedtime.   VENLAFAXINE XR (EFFEXOR-XR) 150 MG 24 HR CAPSULE    Take 300 mg by mouth at bedtime.  Modified Medications   Modified Medication Previous Medication   TORSEMIDE (DEMADEX) 10 MG TABLET torsemide (DEMADEX) 10 MG tablet      Take 40 mg by mouth daily.    Take 2 tablets (20 mg total) by mouth daily.  Discontinued Medications   ENSURE (ENSURE)    Take 1 Can by mouth 3 (three) times daily with meals.     LUBIPROSTONE (AMITIZA) 24  MCG CAPSULE     Take 24 mcg by mouth 2 (two) times daily with a meal.   NYSTATIN-TRIAMCINOLONE (MYCOLOG II) CREAM    Apply 1 application topically 4 (four) times daily.     VITAMIN C (ASCORBIC ACID) 500 MG TABLET    Take 500 mg by mouth 2 (two) times daily.    SIGNIFICANT DIAGNOSTIC EXAMS   03-25-13: lumbar spine x-ray: demineralization multilevel disc space narrowing and compression Deformities    LAB REVIEWED: 11-10-12: glucose 202; bun 10; creat 0.58; k+ 3.9; na++138; liver normal albumin 3.9 Chol 241; trig 457 12-07-12: chol 209; ldl 118; trig 275 12-27-12: urine culture: e-coli (esbl) macrobid 03-12-13: wbc 7.9; hgb 11.8; hct 33.7; mcv 83.6; plt 264; hgb a1c 9.0 03-20-13: glucose 226; bun 7; creat 0.56; k+ 4.3; na++138    Review of Systems  Respiratory: Negative for cough and shortness of breath.   Cardiovascular: Negative for chest pain and leg swelling.  Gastrointestinal: Negative for heartburn, abdominal pain and constipation.  Musculoskeletal: Positive for back pain. Negative for joint pain.  Skin: Negative.   Neurological: Negative for weakness.  Psychiatric/Behavioral: Negative.      Physical Exam  Constitutional: She is oriented to person, place, and time. She appears well-developed and well-nourished.  obese  Neck: Neck supple.  Cardiovascular: Normal rate, regular rhythm and intact distal pulses.   Respiratory: Effort normal and breath sounds normal.  GI: Soft. Bowel sounds are normal.  Musculoskeletal: Normal range of motion.  Has trace edema; has splints to lower extremities  Neurological: She is alert and oriented to person, place, and time.  Skin: Skin is warm.  Psychiatric: She has a normal mood and affect.       ASSESSMENT/ PLAN:   Low back pain; diabetes; hyperlipidemia; bipolar disease; dvt; corpulmonale; copd  FUTURE ORDERS:  Will increase her lantus to 20 units nightly; and will increase novolog to 3 units prior to meals with the additional 5 units as  needed; will have therapy see patient for back pain management and will begin robaxin 500 mg four times daily for 2 weeks then four times daily as needed will monitor her status

## 2013-04-04 NOTE — Assessment & Plan Note (Signed)
She is without change is taking zocor 10 mg daily takes fish oil 1 gm daily

## 2013-04-04 NOTE — Assessment & Plan Note (Signed)
She is stable is taking miralax daily takes senna s two tabs daily and daily as needed

## 2013-04-04 NOTE — Assessment & Plan Note (Addendum)
She is presently stable is taking depakote 250 mg twice daily to help stabilize mood; takes seroquel 50 mg in the am and 200 mg in the pm and thorazine 25 mg three times daily she has failed several times coming off the thorazine. She is having fewer episodes of yelling and acting out with anxiety in recent weeks is taking xanax  1 mg four times daily and takes effexor xr 300 mg daily

## 2013-04-04 NOTE — Assessment & Plan Note (Signed)
Is stable is taking prilosec 20 mg daily  

## 2013-04-04 NOTE — Assessment & Plan Note (Signed)
Her cbg's continue to remain elevated is taking metformin 1 gm twice daily with lantus 10 units daily and novolog 5 units prior to meals for cbg >=150

## 2013-04-04 NOTE — Assessment & Plan Note (Signed)
She is presently stable is taking demadex 40 mg daily and k+ 5 meq daily she has less edema in her lower extremities

## 2013-04-04 NOTE — Assessment & Plan Note (Addendum)
Is stable is on chronic O2 takes advair 500/50 twice daily takes xopenex 45 mcg inhalrr 2 puffs every 4 hours as needed takes spiriva daily

## 2013-04-04 NOTE — Assessment & Plan Note (Signed)
Is stable is taking trazodone 100 mg nightly

## 2013-04-04 NOTE — Assessment & Plan Note (Signed)
Is stable is taking lopressor 12.5 mg twice daily

## 2013-04-04 NOTE — Assessment & Plan Note (Signed)
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

## 2013-05-03 ENCOUNTER — Non-Acute Institutional Stay (SKILLED_NURSING_FACILITY): Payer: Medicare Other | Admitting: Internal Medicine

## 2013-05-03 DIAGNOSIS — J449 Chronic obstructive pulmonary disease, unspecified: Secondary | ICD-10-CM

## 2013-05-03 DIAGNOSIS — I10 Essential (primary) hypertension: Secondary | ICD-10-CM

## 2013-05-03 DIAGNOSIS — G894 Chronic pain syndrome: Secondary | ICD-10-CM

## 2013-05-03 DIAGNOSIS — E1065 Type 1 diabetes mellitus with hyperglycemia: Secondary | ICD-10-CM

## 2013-05-03 DIAGNOSIS — E1049 Type 1 diabetes mellitus with other diabetic neurological complication: Secondary | ICD-10-CM

## 2013-05-04 DIAGNOSIS — G894 Chronic pain syndrome: Secondary | ICD-10-CM | POA: Insufficient documentation

## 2013-05-04 DIAGNOSIS — E1149 Type 2 diabetes mellitus with other diabetic neurological complication: Secondary | ICD-10-CM | POA: Insufficient documentation

## 2013-05-04 NOTE — Progress Notes (Signed)
PROGRESS NOTE  DATE: 05/03/2013  FACILITY: Nursing Home Location: [facility]  LEVEL OF CARE: SNF (31)  Routine Visit  CHIEF COMPLAINT:  Manage diabetes mellitus, COPD and hypertension  HISTORY OF PRESENT ILLNESS:  REASSESSMENT OF ONGOING PROBLEM(S):  1. HTN: Pt 's HTN remains stable.  Denies CP, sob, DOE, headaches, dizziness or visual disturbances.  No complications from the medications currently being used.  Last BP : 138/62.  2. DM:pt's DM is  unstable.  Pt denies polyuria, polydipsia, polyphagia, changes in vision or hypoglycemic episodes.  No complications noted from the medication presently being used.  Last hemoglobin A1c is: 9 in 3/14.  COPD: the COPD remains stable.  Pt denies sob, cough, wheezing or declining exercise tolerance.  No complications from the medications presently being used.  PAST MEDICAL HISTORY : Reviewed.  No changes.  CURRENT MEDICATIONS: Reviewed per [facility]  REVIEW OF SYSTEMS:  GENERAL: no change in appetite, no fatigue, no weight changes, no fever, chills or weakness RESPIRATORY: no cough, SOB, DOE, wheezing, hemoptysis CARDIAC: no chest pain, or palpitations, complains of lower extremity edema GI: no abdominal pain, diarrhea, constipation, heart burn, nausea or vomiting  PHYSICAL EXAMINATION  VS:  T 97.9       P 72     RR 22      BP 138/62     POX %     WT (Lb) 197.6  GENERAL: no acute distress, obese body habitus EYES: conjunctivae normal, sclerae normal, normal eye lids NECK: supple, trachea midline, no neck masses, no thyroid tenderness, no thyromegaly LYMPHATICS: no LAN in the neck, no supraclavicular LAN RESPIRATORY: breathing is even & unlabored, BS CTAB CARDIAC: RRR, no murmur,no extra heart sounds, +2 bilateral lower extremity edema GI: abdomen soft, normal BS, no masses, no tenderness, no hepatomegaly, no splenomegaly PSYCHIATRIC: the patient is alert & oriented to person, affect & behavior  appropriate  LABS/RADIOLOGY:  4/14 glucose 226 otherwise BMP normal 3/14 hemoglobin 11.8, MCV 83.6 otherwise CBC normal 11/13 glucose 202 otherwise CMP normal, Depakote 38.8 Toe/13 triglycerides 275, LDL 118, total cholesterol 209  ASSESSMENT/PLAN:  1. COPD-well compensated. 2. diabetes mellitus with neuropathy-uncontrolled. Lantus was increased. 3. hypertension-well-controlled. 4. chronic pain-denies ongoing pain. 5. peripheral neuropathy-denies symptoms. 6. hyperlipidemia-recheck fasting lipid panel in one month. 7. check liver profile.  CPT CODE: 16109

## 2013-05-07 ENCOUNTER — Other Ambulatory Visit: Payer: Self-pay | Admitting: *Deleted

## 2013-05-07 MED ORDER — ALPRAZOLAM 1 MG PO TABS: ORAL_TABLET | ORAL | Status: DC

## 2013-05-22 ENCOUNTER — Non-Acute Institutional Stay (SKILLED_NURSING_FACILITY): Payer: Medicare Other | Admitting: Internal Medicine

## 2013-05-22 DIAGNOSIS — E1049 Type 1 diabetes mellitus with other diabetic neurological complication: Secondary | ICD-10-CM

## 2013-05-22 DIAGNOSIS — E1065 Type 1 diabetes mellitus with hyperglycemia: Secondary | ICD-10-CM

## 2013-05-22 DIAGNOSIS — I10 Essential (primary) hypertension: Secondary | ICD-10-CM

## 2013-05-22 DIAGNOSIS — G8929 Other chronic pain: Secondary | ICD-10-CM

## 2013-05-22 DIAGNOSIS — J449 Chronic obstructive pulmonary disease, unspecified: Secondary | ICD-10-CM

## 2013-05-24 NOTE — Progress Notes (Signed)
PROGRESS NOTE  DATE: 05-22-13  FACILITY: Nursing Home Location: [facility]  LEVEL OF CARE: SNF (31)  Routine Visit  CHIEF COMPLAINT:  Manage diabetes mellitus, COPD and hypertension  HISTORY OF PRESENT ILLNESS:  REASSESSMENT OF ONGOING PROBLEM(S):  1. HTN: Pt 's HTN remains stable.  Denies CP, sob, DOE, headaches, dizziness or visual disturbances.  No complications from the medications currently being used.  Last BP : 138/62.  2. DM:pt's DM is  unstable.  Pt denies polyuria, polydipsia, polyphagia, changes in vision or hypoglycemic episodes.  No complications noted from the medication presently being used.  Last hemoglobin A1c is: 9 in 3/14.  COPD: the COPD remains stable.  Pt denies sob, cough, wheezing or declining exercise tolerance.  No complications from the medications presently being used.  PAST MEDICAL HISTORY : Reviewed.  No changes.  CURRENT MEDICATIONS: Reviewed per [facility]  REVIEW OF SYSTEMS:  GENERAL: no change in appetite, no fatigue, no weight changes, no fever, chills or weakness RESPIRATORY: no cough, SOB, DOE, wheezing, hemoptysis CARDIAC: no chest pain, or palpitations, complains of lower extremity edema GI: no abdominal pain, diarrhea, constipation, heart burn, nausea or vomiting  PHYSICAL EXAMINATION  VS:  T 97.6      P 64     RR 18      BP 138/62     POX %     WT (Lb) 197.6  GENERAL: no acute distress, obese body habitus NECK: supple, trachea midline, no neck masses, no thyroid tenderness, no thyromegaly RESPIRATORY: breathing is even & unlabored, BS CTAB CARDIAC: RRR, no murmur,no extra heart sounds, +2 bilateral lower extremity edema GI: abdomen soft, normal BS, no masses, no tenderness, no hepatomegaly, no splenomegaly PSYCHIATRIC: the patient is alert & oriented to person, affect & behavior appropriate  LABS/RADIOLOGY:  5-14 CBC normal  4/14 glucose 226 otherwise BMP normal 3/14 hemoglobin 11.8, MCV 83.6 otherwise  CBC normal 11/13 glucose 202 otherwise CMP normal, Depakote 38.8 Toe/13 triglycerides 275, LDL 118, total cholesterol 209  ASSESSMENT/PLAN:  1. COPD-well compensated. 2. diabetes mellitus with neuropathy-uncontrolled. Lantus was increased. 3. hypertension-well-controlled. 4. chronic pain-denies ongoing pain. 5. peripheral neuropathy-denies symptoms. 6. hyperlipidemia-recheck fasting lipid panel. 7. check liver profile.  CPT CODE: 78295

## 2013-05-26 ENCOUNTER — Non-Acute Institutional Stay (SKILLED_NURSING_FACILITY): Payer: Medicare Other | Admitting: Internal Medicine

## 2013-05-26 DIAGNOSIS — B9789 Other viral agents as the cause of diseases classified elsewhere: Secondary | ICD-10-CM

## 2013-05-26 DIAGNOSIS — J449 Chronic obstructive pulmonary disease, unspecified: Secondary | ICD-10-CM

## 2013-05-26 DIAGNOSIS — J029 Acute pharyngitis, unspecified: Secondary | ICD-10-CM

## 2013-05-26 DIAGNOSIS — B349 Viral infection, unspecified: Secondary | ICD-10-CM

## 2013-05-29 ENCOUNTER — Non-Acute Institutional Stay (SKILLED_NURSING_FACILITY): Payer: Medicare Other | Admitting: Internal Medicine

## 2013-05-29 DIAGNOSIS — E781 Pure hyperglyceridemia: Secondary | ICD-10-CM

## 2013-06-06 NOTE — Progress Notes (Shared)
Patient ID: [name], female   DOB: 1941-11-22, 72 y.o.   MRN: [id number]           PROGRESS NOTE  DATE:  05/25/2013  FACILITY: [facility]   LEVEL OF CARE:   SNF   Acute Visit   CHIEF COMPLAINT:  Sore throat/throat pain.    HISTORY OF PRESENT ILLNESS:  [name] has been complaining every time I am in the building for the last several weeks of pain in her throat.  I actually peeked at this in the hallway two weeks ago and I did not see anything ominous at the time.  She appeared to have a laryngitis which probably was viral.  That issue has largely cleared, but she continues to complain of pain in her throat which is especially worse when she swallows.  There is no clear dysphagia.  She has not been systemically unwell.  She does have a history of gastroesophageal reflux disease for which she takes Mylanta p.r.n.  She also takes omeprazole 20 mg daily.  I note that she is on Advair Diskus 500/50.    PHYSICAL EXAMINATION:   HEENT:  MOUTH/THROAT:   Her oropharynx looks fine.  I see no lesions.  In particular, no evidence of thrush.  CHEST/RESPIRATORY:  Shallow air entry compatible with her known COPD.   Her work of breathing is not excessive.   CARDIOVASCULAR:  CARDIAC:   No clear evidence of congestive heart failure.    ASSESSMENT/PLAN:  Sore throat.  This started with some form of viral accompaniment that seems to have resolved.  This all could be a postviral syndrome.   I wonder about gastroesophageal reflux disease and/or thrust that I cannot see.  I am going to give her oral Nystatin as a trial for a week.  I am going to increase her omeprazole.  If the complaint continues for the next month, then we may need to have somebody look at the oropharynx and laryngeal area.    CPT CODE: 65784

## 2013-06-08 ENCOUNTER — Other Ambulatory Visit: Payer: Self-pay | Admitting: Geriatric Medicine

## 2013-06-08 MED ORDER — OXYCODONE HCL ER 10 MG PO T12A: 10.0000 mg | EXTENDED_RELEASE_TABLET | Freq: Two times a day (BID) | ORAL | Status: DC

## 2013-06-18 DIAGNOSIS — E781 Pure hyperglyceridemia: Secondary | ICD-10-CM | POA: Insufficient documentation

## 2013-06-18 NOTE — Progress Notes (Signed)
Patient ID: [name], female   DOB: [birth year], 72 y.o.   MRN: [id number]        PROGRESS NOTE  DATE: 05/29/2013  FACILITY:  [facility]  LEVEL OF CARE: SNF (31)  Acute Visit  CHIEF COMPLAINT:  Manage hypertriglyceridemia.    HISTORY OF PRESENT ILLNESS: I was requested by the staff to assess the patient regarding above problem(s):  HYPERTRIGLYCERIDEMIA:  On 05/28/2013, patient's triglycerides were 299, otherwise fasting lipid panel normal.  She is  currently on a statin only.  She denies any  side effects from the statin.    PAST MEDICAL HISTORY : Reviewed.  No changes.  CURRENT MEDICATIONS: Reviewed per [facility]  REVIEW OF SYSTEMS:  GENERAL: no change in appetite, no fatigue, no weight changes, no fever, chills or weakness RESPIRATORY: no cough, SOB, DOE,, wheezing, hemoptysis CARDIAC: no chest pain, edema or palpitations GI: no abdominal pain, diarrhea, constipation, heart burn, nausea or vomiting  PHYSICAL EXAMINATION  GENERAL: no acute distress, moderately obese body habitus NECK: supple, trachea midline, no neck masses, no thyroid tenderness, no thyromegaly RESPIRATORY: breathing is even & unlabored, BS CTAB CARDIAC: RRR, no murmur,no extra heart sounds EDEMA/VARICOSITIES:  +2 bilateral lower extremity edema  ARTERIAL:  pedal pulses nonpalpable   GI: abdomen soft, normal BS, no masses, no tenderness, no hepatomegaly, no splenomegaly PSYCHIATRIC: the patient is alert & oriented to person, affect & behavior appropriate  LABS/RADIOLOGY: 05/28/2013:  Liver profile normal.   ASSESSMENT/PLAN:  Hypertriglyceridemia.  Unstable problem.  Her  hemoglobin A1C was 9 in March.  Uncontrolled hypertriglyceridemia likely secondary to uncontrolled diabetes mellitus.  Therefore, we will not add fenofibrate.  We will check a  hemoglobin A1C and try to obtain better glycemic control.   CPT CODE: 95621

## 2013-06-25 ENCOUNTER — Non-Acute Institutional Stay (SKILLED_NURSING_FACILITY): Payer: Medicare Other | Admitting: Adult Health

## 2013-06-25 DIAGNOSIS — E1149 Type 2 diabetes mellitus with other diabetic neurological complication: Secondary | ICD-10-CM

## 2013-06-25 DIAGNOSIS — I279 Pulmonary heart disease, unspecified: Secondary | ICD-10-CM

## 2013-06-25 DIAGNOSIS — K219 Gastro-esophageal reflux disease without esophagitis: Secondary | ICD-10-CM

## 2013-06-25 DIAGNOSIS — G894 Chronic pain syndrome: Secondary | ICD-10-CM

## 2013-06-25 DIAGNOSIS — F319 Bipolar disorder, unspecified: Secondary | ICD-10-CM

## 2013-06-25 DIAGNOSIS — E785 Hyperlipidemia, unspecified: Secondary | ICD-10-CM

## 2013-06-25 DIAGNOSIS — J449 Chronic obstructive pulmonary disease, unspecified: Secondary | ICD-10-CM

## 2013-06-25 DIAGNOSIS — I10 Essential (primary) hypertension: Secondary | ICD-10-CM

## 2013-06-28 ENCOUNTER — Other Ambulatory Visit: Payer: Self-pay

## 2013-06-29 ENCOUNTER — Other Ambulatory Visit: Payer: Self-pay | Admitting: Geriatric Medicine

## 2013-06-29 MED ORDER — HYDROCODONE-ACETAMINOPHEN 5-325 MG PO TABS: 2.0000 | ORAL_TABLET | Freq: Three times a day (TID) | ORAL | Status: DC

## 2013-08-07 ENCOUNTER — Other Ambulatory Visit: Payer: Self-pay | Admitting: Geriatric Medicine

## 2013-08-07 MED ORDER — OXYCODONE HCL ER 10 MG PO T12A: 10.0000 mg | EXTENDED_RELEASE_TABLET | Freq: Two times a day (BID) | ORAL | Status: DC

## 2013-08-14 ENCOUNTER — Non-Acute Institutional Stay (SKILLED_NURSING_FACILITY): Payer: Medicare Other | Admitting: Internal Medicine

## 2013-08-14 DIAGNOSIS — J449 Chronic obstructive pulmonary disease, unspecified: Secondary | ICD-10-CM

## 2013-08-14 DIAGNOSIS — G8929 Other chronic pain: Secondary | ICD-10-CM

## 2013-08-14 DIAGNOSIS — E1149 Type 2 diabetes mellitus with other diabetic neurological complication: Secondary | ICD-10-CM

## 2013-08-14 DIAGNOSIS — I10 Essential (primary) hypertension: Secondary | ICD-10-CM

## 2013-08-14 NOTE — Progress Notes (Signed)
PROGRESS NOTE  DATE: 08-14-13  FACILITY: Nursing Home Location: [facility]  LEVEL OF CARE: SNF (31)  Routine Visit  CHIEF COMPLAINT:  Manage diabetes mellitus, COPD and hypertension  HISTORY OF PRESENT ILLNESS:  REASSESSMENT OF ONGOING PROBLEM(S):  HTN: Pt 's HTN remains stable.  Denies CP, sob, DOE, headaches, dizziness or visual disturbances.  No complications from the medications currently being used.  Last BP : 138/62, 155/80.  DM:pt's DM is  unstable.  Pt denies polyuria, polydipsia, polyphagia, changes in vision or hypoglycemic episodes.  No complications noted from the medication presently being used.  Last hemoglobin A1c is: 9 in 3/14, in 7/14 hemoglobin A1c 6.9.  COPD: the COPD remains stable.  Pt denies sob, cough, wheezing or declining exercise tolerance.  No complications from the medications presently being used.  PAST MEDICAL HISTORY : Reviewed.  No changes.  CURRENT MEDICATIONS: Reviewed per [facility]  REVIEW OF SYSTEMS:  GENERAL: no change in appetite, no fatigue, no weight changes, no fever, chills or weakness RESPIRATORY: no cough, SOB, DOE, wheezing, hemoptysis CARDIAC: no chest pain, or palpitations, complains of lower extremity edema GI: no abdominal pain, diarrhea, constipation, heart burn, nausea or vomiting  PHYSICAL EXAMINATION  VS:  T 97.7      P 64     RR 20      BP 155/80    POX %     WT (Lb) 200.4  GENERAL: no acute distress, obese body habitus NECK: supple, trachea midline, no neck masses, no thyroid tenderness, no thyromegaly RESPIRATORY: breathing is even & unlabored, BS CTAB CARDIAC: RRR, no murmur,no extra heart sounds, +2 bilateral lower extremity edema GI: abdomen soft, normal BS, no masses, no tenderness, no hepatomegaly, no splenomegaly PSYCHIATRIC: the patient is alert & oriented to person, affect & behavior appropriate  LABS/RADIOLOGY:  6/14 triglycerides 299, HDL 33 otherwise Fasting lipid panel normal,  liver profile normal  5-14 CBC normal  4/14 glucose 226 otherwise BMP normal 3/14 hemoglobin 11.8, MCV 83.6 otherwise CBC normal 11/13 glucose 202 otherwise CMP normal, Depakote 38.8 Toe/13 triglycerides 275, LDL 118, total cholesterol 209  ASSESSMENT/PLAN:  COPD-well compensated. diabetes mellitus with neuropathy-well controlled hypertension-blood pressure elevated. Will review a log. chronic pain-denies ongoing pain. peripheral neuropathy-denies symptoms. hyperlipidemia-triglycerides elevated secondary to uncontrolled diabetes before. check Depakote level.  CPT CODE: 16109

## 2013-08-21 ENCOUNTER — Non-Acute Institutional Stay (SKILLED_NURSING_FACILITY): Payer: Medicare Other | Admitting: Internal Medicine

## 2013-08-21 DIAGNOSIS — R300 Dysuria: Secondary | ICD-10-CM

## 2013-09-04 ENCOUNTER — Non-Acute Institutional Stay (SKILLED_NURSING_FACILITY): Payer: Medicare Other | Admitting: Internal Medicine

## 2013-09-04 DIAGNOSIS — J449 Chronic obstructive pulmonary disease, unspecified: Secondary | ICD-10-CM

## 2013-09-04 DIAGNOSIS — G8929 Other chronic pain: Secondary | ICD-10-CM

## 2013-09-04 DIAGNOSIS — J4489 Other specified chronic obstructive pulmonary disease: Secondary | ICD-10-CM

## 2013-09-04 DIAGNOSIS — E1149 Type 2 diabetes mellitus with other diabetic neurological complication: Secondary | ICD-10-CM

## 2013-09-04 DIAGNOSIS — I10 Essential (primary) hypertension: Secondary | ICD-10-CM

## 2013-09-05 NOTE — Progress Notes (Signed)
PROGRESS NOTE  DATE: 09-04-13  FACILITY: Nursing Home Location: [facility]  LEVEL OF CARE: SNF (31)  Routine Visit  CHIEF COMPLAINT:  Manage diabetes mellitus, COPD and hypertension  HISTORY OF PRESENT ILLNESS:  REASSESSMENT OF ONGOING PROBLEM(S):  HTN: Pt 's HTN remains stable.  Denies CP, sob, DOE, headaches, dizziness or visual disturbances.  No complications from the medications currently being used.  Last BP : 138/62, 155/80, 143/71.  DM:pt's DM is  unstable.  Pt denies polyuria, polydipsia, polyphagia, changes in vision or hypoglycemic episodes.  No complications noted from the medication presently being used.  Last hemoglobin A1c is: 9 in 3/14, in 7/14 hemoglobin A1c 6.9.  COPD: the COPD remains stable.  Pt denies sob, cough, wheezing or declining exercise tolerance.  No complications from the medications presently being used.  PAST MEDICAL HISTORY : Reviewed.  No changes.  CURRENT MEDICATIONS: Reviewed per [facility]  REVIEW OF SYSTEMS:  GENERAL: no change in appetite, no fatigue, no weight changes, no fever, chills or weakness RESPIRATORY: no cough, SOB, DOE, wheezing, hemoptysis CARDIAC: no chest pain, or palpitations, complains of lower extremity edema GI: no abdominal pain, diarrhea, constipation, heart burn, nausea or vomiting  PHYSICAL EXAMINATION  VS:  T 98.1      P 110     RR 20      BP 143/71    POX %     WT (Lb) 199.6  GENERAL: no acute distress, obese body habitus EYES: Normal conjunctivae, normal sclerae, no discharge NECK: supple, trachea midline, no neck masses, no thyroid tenderness, no thyromegaly LYMPHATICS: No supraclavicular lymphadenopathy, no cervical lymphadenopathy RESPIRATORY: breathing is even & unlabored, BS CTAB CARDIAC: RRR, no murmur,no extra heart sounds, +2 bilateral lower extremity edema GI: abdomen soft, normal BS, no masses, no tenderness, no hepatomegaly, no splenomegaly PSYCHIATRIC: the patient is alert &  oriented to person, affect & behavior appropriate  LABS/RADIOLOGY:  8-14 Depakote level 59.4  6/14 triglycerides 299, HDL 33 otherwise Fasting lipid panel normal, liver profile normal  5-14 CBC normal  4/14 glucose 226 otherwise BMP normal 3/14 hemoglobin 11.8, MCV 83.6 otherwise CBC normal 11/13 glucose 202 otherwise CMP normal, Depakote 38.8 Toe/13 triglycerides 275, LDL 118, total cholesterol 209  ASSESSMENT/PLAN:  COPD-well compensated. diabetes mellitus with neuropathy-well controlled hypertension-blood pressure elevated. Increase metoprolol to 25 mg twice a day chronic pain-denies ongoing pain. peripheral neuropathy-denies symptoms. hyperlipidemia-triglycerides elevated secondary to uncontrolled diabetes before.  CPT CODE: 53664

## 2013-09-06 ENCOUNTER — Other Ambulatory Visit: Payer: Self-pay | Admitting: *Deleted

## 2013-09-06 MED ORDER — OXYCODONE HCL ER 10 MG PO T12A: 10.0000 mg | EXTENDED_RELEASE_TABLET | Freq: Two times a day (BID) | ORAL | Status: DC

## 2013-09-17 DIAGNOSIS — R300 Dysuria: Secondary | ICD-10-CM | POA: Insufficient documentation

## 2013-09-17 NOTE — Progress Notes (Signed)
Patient ID: [name], female   DOB: Jul 18, 1941, 72 y.o.   MRN: [id number]        PROGRESS NOTE  DATE: 08/21/2013  FACILITY:  [facility]  LEVEL OF CARE: SNF (31)  Acute Visit  CHIEF COMPLAINT:  Manage dysuria.    HISTORY OF PRESENT ILLNESS: I was requested by the staff to assess the patient regarding above problem(s):  The patient is complaining of pain and burning with urination for one day.  She denies suprapubic pain or worsening low back pain.  She denies hematuria.    PAST MEDICAL HISTORY : Reviewed.  No changes.  CURRENT MEDICATIONS: Reviewed per [facility]  REVIEW OF SYSTEMS:  GENERAL: no change in appetite, no fatigue, no weight changes, no fever, chills or weakness RESPIRATORY: no cough, SOB, DOE,, wheezing, hemoptysis CARDIAC: no chest pain, edema or palpitations GI: no abdominal pain, diarrhea, constipation, heart burn, nausea or vomiting  PHYSICAL EXAMINATION  VS:  T 97.8       P 95      RR 20      BP 136/80     POX %       WT (Lb)  GENERAL: no acute distress, morbidly obese body habitus NECK: supple, trachea midline, no neck masses, no thyroid tenderness, no thyromegaly RESPIRATORY: breathing is even & unlabored, BS CTAB CARDIAC: RRR, no murmur,no extra heart sounds EDEMA/VARICOSITIES:  +2 bilateral lower extremity edema  ARTERIAL:  pedal pulses nonpalpable   GI: abdomen soft, normal BS, no masses, no tenderness, no hepatomegaly, no splenomegaly PSYCHIATRIC: the patient is alert & oriented to person, affect & behavior appropriate  ASSESSMENT/PLAN:  Dysuria.  New problem.   Check UA, culture and sensitivities.    CPT CODE: 78295

## 2013-10-02 ENCOUNTER — Non-Acute Institutional Stay (SKILLED_NURSING_FACILITY): Payer: Medicare Other | Admitting: Internal Medicine

## 2013-10-02 ENCOUNTER — Other Ambulatory Visit: Payer: Self-pay | Admitting: *Deleted

## 2013-10-02 DIAGNOSIS — E1049 Type 1 diabetes mellitus with other diabetic neurological complication: Secondary | ICD-10-CM

## 2013-10-02 DIAGNOSIS — J4489 Other specified chronic obstructive pulmonary disease: Secondary | ICD-10-CM

## 2013-10-02 DIAGNOSIS — G8929 Other chronic pain: Secondary | ICD-10-CM

## 2013-10-02 DIAGNOSIS — J449 Chronic obstructive pulmonary disease, unspecified: Secondary | ICD-10-CM

## 2013-10-02 DIAGNOSIS — I10 Essential (primary) hypertension: Secondary | ICD-10-CM

## 2013-10-02 MED ORDER — OXYCODONE HCL ER 10 MG PO T12A: 10.0000 mg | EXTENDED_RELEASE_TABLET | Freq: Two times a day (BID) | ORAL | Status: DC

## 2013-10-06 NOTE — Progress Notes (Signed)
PROGRESS NOTE  DATE: 10-02-13  FACILITY: Nursing Home Location: [facility]  LEVEL OF CARE: SNF (31)  Routine Visit  CHIEF COMPLAINT:  Manage diabetes mellitus, COPD and hypertension  HISTORY OF PRESENT ILLNESS:  REASSESSMENT OF ONGOING PROBLEM(S):  HTN: Pt 's HTN remains stable.  Denies CP, sob, DOE, headaches, dizziness or visual disturbances.  No complications from the medications currently being used.  Last BP : 138/62, 155/80, 143/71, 137/80.  DM:pt's DM is  unstable.  Pt denies polyuria, polydipsia, polyphagia, changes in vision or hypoglycemic episodes.  No complications noted from the medication presently being used.  Last hemoglobin A1c is: 9 in 3/14, in 7/14 hemoglobin A1c 6.9.  COPD: the COPD remains stable.  Pt denies sob, cough, wheezing or declining exercise tolerance.  No complications from the medications presently being used.  PAST MEDICAL HISTORY : Reviewed.  No changes.  CURRENT MEDICATIONS: Reviewed per [facility]  REVIEW OF SYSTEMS:  GENERAL: no change in appetite, no fatigue, no weight changes, no fever, chills or weakness RESPIRATORY: no cough, SOB, DOE, wheezing, hemoptysis CARDIAC: no chest pain, or palpitations, complains of lower extremity edema GI: no abdominal pain, diarrhea, constipation, heart burn, nausea or vomiting  PHYSICAL EXAMINATION  VS:  T 97      P 80     RR 18      BP 137/80    POX %     WT (Lb) 200  GENERAL: no acute distress, obese body habitus EYES: Normal conjunctivae, normal sclerae, no discharge NECK: supple, trachea midline, no neck masses, no thyroid tenderness, no thyromegaly LYMPHATICS: No supraclavicular lymphadenopathy, no cervical lymphadenopathy RESPIRATORY: breathing is even & unlabored, BS CTAB CARDIAC: RRR, no murmur,no extra heart sounds, +2 bilateral lower extremity edema GI: abdomen soft, normal BS, no masses, no tenderness, no hepatomegaly, no splenomegaly PSYCHIATRIC: the patient is  alert & oriented to person, affect & behavior appropriate  LABS/RADIOLOGY:  8-14 Depakote level 59.4  6/14 triglycerides 299, HDL 33 otherwise Fasting lipid panel normal, liver profile normal  5-14 CBC normal  4/14 glucose 226 otherwise BMP normal 3/14 hemoglobin 11.8, MCV 83.6 otherwise CBC normal 11/13 glucose 202 otherwise CMP normal, Depakote 38.8 Toe/13 triglycerides 275, LDL 118, total cholesterol 209  ASSESSMENT/PLAN:  COPD-well compensated. diabetes mellitus with neuropathy-well controlled hypertension-blood pressure improved. chronic pain-c/o uncontrolled LBP & requests an increase in narcotics.  Pt is on high dose of narcotics, therefore will differ decision to primary MD Dr. Leanord Hawking. peripheral neuropathy-denies symptoms. hyperlipidemia-triglycerides elevated secondary to uncontrolled diabetes before.  CPT CODE: 16109

## 2013-10-23 ENCOUNTER — Encounter: Payer: Self-pay | Admitting: Adult Health

## 2013-10-23 NOTE — Progress Notes (Signed)
Patient ID: [name], female   DOB: 1941/06/04, 72 y.o.   MRN: [id number]  [facility]  Allergies  Allergen Reactions  . Avelox [Moxifloxacin Hcl In Nacl]   . Morphine And Related Other (See Comments)    Pain dysregulation Mood disruption  . Olanzapine     Hallucinations and disorientation  . Sulfamethoxazole-Trimethoprim Hives    Chief Complaint  Patient presents with  . Medical Managment of Chronic Issues    HPI She is being seen for the management of her chronic illnesses. Overall her status remains without change; she has been treated for an uti with levaquin ; tolerated this medication easily and has resolved her infection. There are no concerns being voiced by nursing staff; and she is not voicing any new concerns.   Past Medical History  Diagnosis Date  . Anxiety   . Arthritis   . COPD (chronic obstructive pulmonary disease)   . GERD (gastroesophageal reflux disease)   . Hyperlipidemia   . Osteoporosis   . DVT (deep venous thrombosis)   . PE (pulmonary thromboembolism)   . Hypertension      PT DENIES....ON NO  MEDS  . Diabetes mellitus   . Cancer 1969    cervical  . Pneumonia     h/o  . Bronchitis     h/o  . Blood transfusion   . Anemia   . Bipolar affect, depressed     Past Surgical History  Procedure Laterality Date  . Vena cava filter placement    . 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 Eye surgery      CATARACTS  . Achilles tendon lengthening  11/18/11    and repair w/posterior tibial tendon lengthening; right  foot  . Cholecystectomy    . Abdominal hysterectomy  1969    "womb taken out for cervical cancer"  . Cataract extraction, bilateral  ~ 2008  . Tonsillectomy      "as a child"  . Achilles tendon surgery  11/18/2011    Procedure: ACHILLES TENDON REPAIR;  Surgeon: Toni Arthurs, MD;  Location: [facility] OR;  Service: Orthopedics;  Laterality: Right;  Right Posterior Tibial Tendon Lenghtening and Tendon Achilles Lenghtening     Filed  Vitals:   06/25/13 1416  BP: 139/83  Pulse: 100  Height: 5\' 2"  (1.575 m)  Weight: 202 lb 6.4 oz (91.808 kg)    Current Outpatient Prescriptions on File Prior to Visit  Medication Sig Dispense Refill  . ALPRAZolam (XANAX) 1 MG tablet Take 1 tablet four times a day . Take 1 tablet daily at 3 PM as needed for increased anxiety  210 tablet  5  . aspirin 81 MG tablet Take 81 mg by mouth daily.      . chlorproMAZINE (THORAZINE) 25 MG tablet Take 25 mg by mouth 3 (three) times daily.      . divalproex (DEPAKOTE ER) 250 MG 24 hr tablet Take 250 mg by mouth 2 (two) times daily.       . fish oil-omega-3 fatty acids 1000 MG capsule Take 1 g by mouth daily.      . Fluticasone-Salmeterol (ADVAIR DISKUS) 500-50 MCG/DOSE AEPB Inhale 1 puff into the lungs 2 (two) times daily.  60 each  11  . gabapentin (NEURONTIN) 300 MG capsule Take 900 mg by mouth 3 (three) times daily.      . insulin aspart (NOVOLOG) 100 UNIT/ML injection Inject 3 Units into the skin 3 (three) times  daily before meals. 3 units prior to meals with an additional 5 units For cbg >=150  1 vial  99  . insulin glargine (LANTUS) 100 UNIT/ML injection Inject 0.2 mLs (20 Units total) into the skin at bedtime.  10 mL  99  . levalbuterol (XOPENEX HFA) 45 MCG/ACT inhaler Inhale 2 puffs into the lungs every 4 (four) hours as needed for wheezing.      . metFORMIN (GLUCOPHAGE) 1000 MG tablet Take 1,000 mg by mouth 2 (two) times daily with a meal. 1500 mg in the am and and 1 gm in the pm      . metoprolol tartrate (LOPRESSOR) 25 MG tablet Take 12.5 mg by mouth 2 (two) times daily.      Marland Kitchen omeprazole (PRILOSEC) 20 MG capsule Take 20 mg by mouth daily.      . polyethylene glycol (MIRALAX / GLYCOLAX) packet Take 17 g by mouth daily.        . potassium chloride (K-DUR,KLOR-CON) 10 MEQ tablet Take 5 mEq by mouth daily. Takes in the am and 5 meq at bedtime      . promethazine (PHENERGAN) 25 MG tablet Take 25 mg by mouth every 6 (six) hours as needed.  nausea      . QUEtiapine (SEROQUEL) 25 MG tablet Take 50-200 mg by mouth 2 (two) times daily. Takes 200 mg nightly and 50 mg in the am      . sennosides-docusate sodium (SENOKOT-S) 8.6-50 MG tablet Take 2 tablets by mouth daily. And one tab daily as needed      . simvastatin (ZOCOR) 10 MG tablet Take 10 mg by mouth at bedtime.      Marland Kitchen tiotropium (SPIRIVA) 18 MCG inhalation capsule Place 1 capsule (18 mcg total) into inhaler and inhale daily.  30 capsule  11  . torsemide (DEMADEX) 10 MG tablet Take 40 mg by mouth daily.      . traZODone (DESYREL) 50 MG tablet Take 100 mg by mouth at bedtime. May have an additional 25 mg nightly as needed      . venlafaxine XR (EFFEXOR-XR) 150 MG 24 hr capsule Take 300 mg by mouth at bedtime.       No current facility-administered medications on file prior to visit.      SIGNIFICANT DIAGNOSTIC EXAMS   03-25-13: lumbar spine x-ray: demineralization multilevel disc space narrowing and compression Deformities    LAB REVIEWED: 11-10-12: glucose 202; bun 10; creat 0.58; k+ 3.9; na++138; liver normal albumin 3.9 Chol 241; trig 457 12-07-12: chol 209; ldl 118; trig 275 12-27-12: urine culture: e-coli (esbl) macrobid 03-12-13: wbc 7.9; hgb 11.8; hct 33.7; mcv 83.6; plt 264; hgb a1c 9.0 03-20-13: glucose 226; bun 7; creat 0.56; k+ 4.3; na++138  05-14-13: wbc 6.8; hgb 12.8; hct 38.4; mcv 87.9; plt 245;  05-28-13: chol 169; ldl 76; trig 299; liver normal albumin 3.9 06-11-13: urine culture: k pneumoniae: levaquin     Review of Systems  Respiratory: Negative for cough and shortness of breath.   Cardiovascular: Negative for chest pain and leg swelling.  Gastrointestinal: Negative for heartburn, abdominal pain and constipation.  Musculoskeletal: Positive for back pain. Negative for joint pain.  Skin: Negative.   Neurological: Negative for weakness.  Psychiatric/Behavioral: Negative.      Physical Exam  Constitutional: She is oriented to person, place, and time. She  appears well-developed and well-nourished.  obese  Neck: Neck supple.  Cardiovascular: Normal rate, regular rhythm and intact distal pulses.   Respiratory: Effort  normal and breath sounds normal.  GI: Soft. Bowel sounds are normal.  Musculoskeletal: Normal range of motion.  Has trace edema; has splints to lower extremities  Neurological: She is alert and oriented to person, place, and time.  Skin: Skin is warm.  Psychiatric: She has a normal mood and affect.     ASSESSMENT/ PLAN:  1. Diabetes: will continue lantus 20 units metformin 1500 mg in the am and 1 gm in the pm and novolog 3 units prior to meals with an additional 5 units for cbg >=150   2. Copd: will continue advair 500/50 twice daily; spiriva daily; xopenex 45 mcg 2 puffs every 4 hours as needed and her O2 will monitor her status  3. Hypertension: will continue lopressor 12.5 mg twice daily; asa 81 mg daily   4.  Cor pulmonale: is stable will continue demadex 40 mg daily with k+ 5 meq daily   5. Chronic pain: her pain is adequately managed; will continue her oxycontin 10 mg twice daily; nueruontin 900 mg three times daily; and vicodin 5/325 mg 2 tabs every 8 hours as needed will monitor  6. Dyslipidemia: will continue zocor 10 mg daily and fish oil 1 gm twice daily  7. Constipation: will continue senna s 2 tabs daily and daily as needed; miralax daily; dulcolax supp three days per week  8. Gerd: will continue prilosec 20 mg daily  9. Bipolar disorder: will continue thorazine 25 mg three times daily; depakote 250 mg twice daily to help stabilize mood; seroquel 50 mg in the am and 200 mg nightly; and effexor xr 300 mg daily and xanax 1 mg four times daily for help with anxiety. Will continue to monitor her status   Will check hgb a1c next blood draw  Time spent with patient 50 minutes.

## 2013-10-29 ENCOUNTER — Non-Acute Institutional Stay (SKILLED_NURSING_FACILITY): Payer: Medicare Other | Admitting: Internal Medicine

## 2013-10-29 DIAGNOSIS — E1149 Type 2 diabetes mellitus with other diabetic neurological complication: Secondary | ICD-10-CM

## 2013-10-29 DIAGNOSIS — J4489 Other specified chronic obstructive pulmonary disease: Secondary | ICD-10-CM

## 2013-10-29 DIAGNOSIS — I10 Essential (primary) hypertension: Secondary | ICD-10-CM

## 2013-10-29 DIAGNOSIS — G8929 Other chronic pain: Secondary | ICD-10-CM

## 2013-10-29 DIAGNOSIS — J449 Chronic obstructive pulmonary disease, unspecified: Secondary | ICD-10-CM

## 2013-11-02 ENCOUNTER — Other Ambulatory Visit: Payer: Self-pay | Admitting: *Deleted

## 2013-11-02 MED ORDER — OXYCODONE HCL ER 10 MG PO T12A: 10.0000 mg | EXTENDED_RELEASE_TABLET | Freq: Two times a day (BID) | ORAL | Status: DC

## 2013-11-02 NOTE — Progress Notes (Signed)
PROGRESS NOTE  DATE: 10-29-13  FACILITY: Nursing Home Location: [facility]  LEVEL OF CARE: SNF (31)  Routine Visit  CHIEF COMPLAINT:  Manage diabetes mellitus, COPD and hypertension  HISTORY OF PRESENT ILLNESS:  REASSESSMENT OF ONGOING PROBLEM(S):  HTN: Pt 's HTN remains stable.  Denies CP, sob, DOE, headaches, dizziness or visual disturbances.  No complications from the medications currently being used.  Last BP : 138/62, 155/80, 143/71, 137/80, 122/69.  DM:pt's DM is  unstable.  Pt denies polyuria, polydipsia, polyphagia, changes in vision or hypoglycemic episodes.  No complications noted from the medication presently being used.  Last hemoglobin A1c is: 9 in 3/14, in 7/14 hemoglobin A1c 6.9.  COPD: the COPD remains stable.  Pt denies sob, cough, wheezing or declining exercise tolerance.  No complications from the medications presently being used.  PAST MEDICAL HISTORY : Reviewed.  No changes.  CURRENT MEDICATIONS: Reviewed per [facility]  REVIEW OF SYSTEMS:  GENERAL: no change in appetite, no fatigue, no weight changes, no fever, chills or weakness RESPIRATORY: no cough, SOB, DOE, wheezing, hemoptysis CARDIAC: no chest pain, or palpitations, complains of lower extremity edema GI: no abdominal pain, diarrhea, constipation, heart burn, nausea or vomiting  PHYSICAL EXAMINATION  VS:  T 97.2      P 75     RR 18      BP 122/69    POX %     WT (Lb) 200.4  GENERAL: no acute distress, obese body habitus NECK: supple, trachea midline, no neck masses, no thyroid tenderness, no thyromegaly RESPIRATORY: breathing is even & unlabored, BS CTAB CARDIAC: RRR, no murmur,no extra heart sounds, +2 bilateral lower extremity edema GI: abdomen soft, normal BS, no masses, no tenderness, no hepatomegaly, no splenomegaly PSYCHIATRIC: the patient is alert & oriented to person, affect & behavior appropriate  LABS/RADIOLOGY:  8-14 Depakote level 59.4  6/14 triglycerides  299, HDL 33 otherwise Fasting lipid panel normal, liver profile normal  5-14 CBC normal  4/14 glucose 226 otherwise BMP normal 3/14 hemoglobin 11.8, MCV 83.6 otherwise CBC normal 11/13 glucose 202 otherwise CMP normal, Depakote 38.8 Toe/13 triglycerides 275, LDL 118, total cholesterol 209  ASSESSMENT/PLAN:  COPD-well compensated. diabetes mellitus with neuropathy-well controlled hypertension-well controlled. chronic pain-no pain c/o today. hyperlipidemia-triglycerides elevated secondary to uncontrolled diabetes before. Check cbc & cmp.  CPT CODE: 40981

## 2013-11-05 ENCOUNTER — Non-Acute Institutional Stay (SKILLED_NURSING_FACILITY): Payer: Medicare Other | Admitting: Internal Medicine

## 2013-11-05 DIAGNOSIS — B3749 Other urogenital candidiasis: Secondary | ICD-10-CM

## 2013-11-05 DIAGNOSIS — B372 Candidiasis of skin and nail: Secondary | ICD-10-CM

## 2013-11-09 ENCOUNTER — Non-Acute Institutional Stay (SKILLED_NURSING_FACILITY): Payer: Medicare Other | Admitting: Internal Medicine

## 2013-11-09 DIAGNOSIS — J441 Chronic obstructive pulmonary disease with (acute) exacerbation: Secondary | ICD-10-CM

## 2013-11-09 NOTE — Progress Notes (Signed)
Patient ID: [name], female   DOB: 1941/01/09, 72 y.o.   MRN: [id number] Facility; [facility] Chief complaint cough productive of green sputum and increasing shortness of breath History; this is a 72 year old woman who is a long-standing resident of this facility. She has underlying COPD on chronic oxygen. 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 He tells me the last 48 hours she has been bothered by cough productive of greenish sputum and increasing shortness of breath she is not running a fever she does not complain of chest pain  Review of systems Cardiac no exertional chest pain Respiratory she does not complain of pleuritic chest pain GI no dysphagia no abdominal pain  Physical examination; Gen. the patient does not appear to be in any acute distress. Fairly marked tardive dyskinesia Respiratory few crackles in the right lower lobe, shallow air entry in the left lower lobe but no crackles or wheezes Cardiac heart sounds are normal no murmurs no increase in jugular venous pressure.  Impression/plan #1 probable acute bronchitis mild exacerbation of COPD. She already has Xopenex ordered when necessary she is allergic to quinolones but will start her on Augmentin. A chest x-ray seems indicated.

## 2013-11-12 ENCOUNTER — Non-Acute Institutional Stay (SKILLED_NURSING_FACILITY): Payer: Medicare Other | Admitting: Internal Medicine

## 2013-11-12 DIAGNOSIS — J189 Pneumonia, unspecified organism: Secondary | ICD-10-CM

## 2013-11-12 DIAGNOSIS — R05 Cough: Secondary | ICD-10-CM

## 2013-11-12 DIAGNOSIS — R197 Diarrhea, unspecified: Secondary | ICD-10-CM

## 2013-11-12 DIAGNOSIS — R059 Cough, unspecified: Secondary | ICD-10-CM

## 2013-11-19 ENCOUNTER — Encounter: Payer: Self-pay | Admitting: Internal Medicine

## 2013-11-19 ENCOUNTER — Non-Acute Institutional Stay (SKILLED_NURSING_FACILITY): Payer: Medicare Other | Admitting: Internal Medicine

## 2013-11-19 DIAGNOSIS — G8929 Other chronic pain: Secondary | ICD-10-CM

## 2013-11-19 DIAGNOSIS — E1149 Type 2 diabetes mellitus with other diabetic neurological complication: Secondary | ICD-10-CM

## 2013-11-19 DIAGNOSIS — I10 Essential (primary) hypertension: Secondary | ICD-10-CM

## 2013-11-19 DIAGNOSIS — J449 Chronic obstructive pulmonary disease, unspecified: Secondary | ICD-10-CM

## 2013-11-19 NOTE — Progress Notes (Signed)
PROGRESS NOTE  DATE: 11-19-13  FACILITY: Nursing Home Location: [facility]  LEVEL OF CARE: SNF (31)  Routine Visit  CHIEF COMPLAINT:  Manage diabetes mellitus, COPD and hypertension  HISTORY OF PRESENT ILLNESS:  REASSESSMENT OF ONGOING PROBLEM(S):  HTN: Pt 's HTN remains stable.  Denies CP, sob, DOE, headaches, dizziness or visual disturbances.  No complications from the medications currently being used.  Last BP : 138/62, 155/80, 143/71, 137/80, 122/69, 141/80.  DM:pt's DM is  unstable.  Pt denies polyuria, polydipsia, polyphagia, changes in vision or hypoglycemic episodes.  No complications noted from the medication presently being used.  Last hemoglobin A1c is: 9 in 3/14, in 7/14 hemoglobin A1c 6.9.  COPD: the COPD remains stable.  Pt denies sob, cough, wheezing or declining exercise tolerance.  No complications from the medications presently being used.  PAST MEDICAL HISTORY : Reviewed.  No changes.  CURRENT MEDICATIONS: Reviewed per [facility]  REVIEW OF SYSTEMS:  GENERAL: no change in appetite, no fatigue, no weight changes, no fever, chills or weakness RESPIRATORY: no cough, SOB, DOE, wheezing, hemoptysis CARDIAC: no chest pain, or palpitations, complains of lower extremity edema GI: no abdominal pain, diarrhea, constipation, heart burn, nausea or vomiting  PHYSICAL EXAMINATION  VS:  T 97      P 68     RR 18      BP 141/80   POX %     WT (Lb) 200.4  GENERAL: no acute distress, obese body habitus NECK: supple, trachea midline, no neck masses, no thyroid tenderness, no thyromegaly RESPIRATORY: breathing is even & unlabored, BS CTAB CARDIAC: RRR, no murmur,no extra heart sounds, +2 bilateral lower extremity edema GI: abdomen soft, normal BS, no masses, no tenderness, no hepatomegaly, no splenomegaly PSYCHIATRIC: the patient is alert & oriented to person, affect & behavior appropriate  LABS/RADIOLOGY:  8-14 Depakote level 59.4  6/14  triglycerides 299, HDL 33 otherwise Fasting lipid panel normal, liver profile normal  5-14 CBC normal  4/14 glucose 226 otherwise BMP normal 3/14 hemoglobin 11.8, MCV 83.6 otherwise CBC normal 11/13 glucose 202 otherwise CMP normal, Depakote 38.8 Toe/13 triglycerides 275, LDL 118, total cholesterol 209  ASSESSMENT/PLAN:  COPD-well compensated. diabetes mellitus with neuropathy-well controlled hypertension-well controlled. chronic pain-no pain c/o today. hyperlipidemia-triglycerides elevated secondary to uncontrolled diabetes before. Recheck fasting lipid panel Pneumonia-completed antibiotics Check cbc & cmp.  CPT CODE: 40981

## 2013-12-07 ENCOUNTER — Other Ambulatory Visit: Payer: Self-pay | Admitting: *Deleted

## 2013-12-07 MED ORDER — OXYCODONE HCL ER 10 MG PO T12A: 10.0000 mg | EXTENDED_RELEASE_TABLET | Freq: Two times a day (BID) | ORAL | Status: DC

## 2013-12-17 ENCOUNTER — Encounter: Payer: Self-pay | Admitting: Internal Medicine

## 2013-12-17 DIAGNOSIS — B372 Candidiasis of skin and nail: Secondary | ICD-10-CM | POA: Insufficient documentation

## 2013-12-17 NOTE — Progress Notes (Signed)
Patient ID: [name], female   DOB: [birth year], 72 y.o.   MRN: [id number]        PROGRESS NOTE  DATE: 11/05/2013    FACILITY:  [facility]  LEVEL OF CARE: SNF (31)  Acute Visit   CHIEF COMPLAINT:  Manage rash.    HISTORY OF PRESENT ILLNESS: I was requested by the staff to assess the patient regarding above problem(s):  Treatment nurse reports that patient has erythematous areas under breasts and under abdominal folds today.  Patient is complaining of burning and pain in the areas.    PAST MEDICAL HISTORY : Reviewed.  No changes.  CURRENT MEDICATIONS: Reviewed per [facility]  PHYSICAL EXAMINATION  GENERAL: no acute distress, moderately obese body habitus SKIN:  INSPECTION: there is erythema under breasts and bilateral abdominal folds    PSYCHIATRIC: the patient is alert & oriented to person, affect & behavior appropriate  ASSESSMENT/PLAN:  Cutaneous candidiasis of under-breasts and abdominal folds.  New problem.  Start Nystatin cream q.d. to areas until resolution.    THN Metrics:   Blood pressure:  131/80.   Aspirin 81 mg q.d.   No current smoking.    CPT CODE: 78295

## 2013-12-20 NOTE — Progress Notes (Signed)
Patient ID: [name], female   DOB: 1941/11/01, 73 y.o.   MRN: [id number]        PROGRESS NOTE  DATE: 11/12/2013    FACILITY:  [facility]  LEVEL OF CARE: SNF (31)  Acute Visit  CHIEF COMPLAINT:  Manage pneumonia.    HISTORY OF PRESENT ILLNESS: I was requested by the staff to assess the patient regarding above problem(s):  This patient had a chest x-ray done on 11/09/2013 that shows a pneumonia.  She is complaining of a cough, but denies increased shortness of breath.   She was started on Augmentin empirically, but she is having diarrhea.  She denies abdominal pain, nausea or vomiting.     PAST MEDICAL HISTORY : Reviewed.  No changes.  CURRENT MEDICATIONS: Reviewed per [facility]  REVIEW OF SYSTEMS:  GENERAL: no change in appetite, no fatigue, no weight changes, no fever, chills or weakness RESPIRATORY: cough; no SOB, DOE,, wheezing, hemoptysis CARDIAC: no chest pain or palpitations;  chronic lower extremity swelling   GI: diarrhea; no abdominal pain, constipation, heart burn, nausea or vomiting  PHYSICAL EXAMINATION  VS:  T 98.2       P 85      RR 18      BP 133/87     POX 97%       WT (Lb)  GENERAL: no acute distress, moderately obese body habitus EYES: conjunctivae normal, sclerae normal, normal eye lids NECK: supple, trachea midline, no neck masses, no thyroid tenderness, no thyromegaly LYMPHATICS: no LAN in the neck, no supraclavicular LAN RESPIRATORY: decreased breath sounds   CARDIAC: RRR, no murmur,no extra heart sounds EDEMA/VARICOSITIES:  +2 bilateral lower extremity edema  ARTERIAL:  pedal pulses nonpalpable   GI: abdomen soft, normal BS, no masses, no tenderness, no hepatomegaly, no splenomegaly PSYCHIATRIC: the patient is alert & oriented to person, affect & behavior appropriate  ASSESSMENT/PLAN:  Pneumonia.  New onset.  Significant problem.  Discontinue Augmentin.  Start ceftriaxone 1 g q.d. x5 days and probiotics b.i.d. x7 days.     Cough.  We will start Robitussin 10 mL t.i.d. for five days.    Diarrhea.  Likely secondary to Augmentin.  We will check stool for C.diff toxin.    CPT CODE: 45625

## 2013-12-21 ENCOUNTER — Encounter: Payer: Self-pay | Admitting: Internal Medicine

## 2013-12-21 DIAGNOSIS — R059 Cough, unspecified: Secondary | ICD-10-CM | POA: Insufficient documentation

## 2013-12-21 DIAGNOSIS — R197 Diarrhea, unspecified: Secondary | ICD-10-CM | POA: Insufficient documentation

## 2013-12-21 DIAGNOSIS — J189 Pneumonia, unspecified organism: Secondary | ICD-10-CM | POA: Insufficient documentation

## 2013-12-21 DIAGNOSIS — R05 Cough: Secondary | ICD-10-CM | POA: Insufficient documentation

## 2013-12-25 ENCOUNTER — Inpatient Hospital Stay (HOSPITAL_COMMUNITY): Payer: Medicare Other

## 2013-12-25 ENCOUNTER — Emergency Department (HOSPITAL_COMMUNITY): Payer: Medicare Other

## 2013-12-25 ENCOUNTER — Encounter (HOSPITAL_COMMUNITY): Payer: Self-pay | Admitting: Emergency Medicine

## 2013-12-25 ENCOUNTER — Inpatient Hospital Stay (HOSPITAL_COMMUNITY)
Admission: EM | Admit: 2013-12-25 | Discharge: 2014-01-01 | DRG: 871 | Disposition: A | Discharge status: Skilled Nursing Facility | Payer: Medicare Other | Attending: Internal Medicine | Admitting: Internal Medicine

## 2013-12-25 DIAGNOSIS — E1065 Type 1 diabetes mellitus with hyperglycemia: Secondary | ICD-10-CM

## 2013-12-25 DIAGNOSIS — Z882 Allergy status to sulfonamides status: Secondary | ICD-10-CM

## 2013-12-25 DIAGNOSIS — R300 Dysuria: Secondary | ICD-10-CM

## 2013-12-25 DIAGNOSIS — J984 Other disorders of lung: Secondary | ICD-10-CM

## 2013-12-25 DIAGNOSIS — J449 Chronic obstructive pulmonary disease, unspecified: Secondary | ICD-10-CM | POA: Diagnosis present

## 2013-12-25 DIAGNOSIS — I10 Essential (primary) hypertension: Secondary | ICD-10-CM

## 2013-12-25 DIAGNOSIS — Z87891 Personal history of nicotine dependence: Secondary | ICD-10-CM

## 2013-12-25 DIAGNOSIS — M899 Disorder of bone, unspecified: Secondary | ICD-10-CM

## 2013-12-25 DIAGNOSIS — K219 Gastro-esophageal reflux disease without esophagitis: Secondary | ICD-10-CM

## 2013-12-25 DIAGNOSIS — Z9981 Dependence on supplemental oxygen: Secondary | ICD-10-CM

## 2013-12-25 DIAGNOSIS — A498 Other bacterial infections of unspecified site: Secondary | ICD-10-CM | POA: Diagnosis present

## 2013-12-25 DIAGNOSIS — R6520 Severe sepsis without septic shock: Secondary | ICD-10-CM

## 2013-12-25 DIAGNOSIS — A419 Sepsis, unspecified organism: Secondary | ICD-10-CM

## 2013-12-25 DIAGNOSIS — Z885 Allergy status to narcotic agent status: Secondary | ICD-10-CM

## 2013-12-25 DIAGNOSIS — I82409 Acute embolism and thrombosis of unspecified deep veins of unspecified lower extremity: Secondary | ICD-10-CM

## 2013-12-25 DIAGNOSIS — M19079 Primary osteoarthritis, unspecified ankle and foot: Secondary | ICD-10-CM

## 2013-12-25 DIAGNOSIS — D649 Anemia, unspecified: Secondary | ICD-10-CM

## 2013-12-25 DIAGNOSIS — D37039 Neoplasm of uncertain behavior of the major salivary glands, unspecified: Secondary | ICD-10-CM

## 2013-12-25 DIAGNOSIS — F411 Generalized anxiety disorder: Secondary | ICD-10-CM

## 2013-12-25 DIAGNOSIS — L22 Diaper dermatitis: Secondary | ICD-10-CM

## 2013-12-25 DIAGNOSIS — Z86711 Personal history of pulmonary embolism: Secondary | ICD-10-CM

## 2013-12-25 DIAGNOSIS — Z794 Long term (current) use of insulin: Secondary | ICD-10-CM

## 2013-12-25 DIAGNOSIS — E785 Hyperlipidemia, unspecified: Secondary | ICD-10-CM

## 2013-12-25 DIAGNOSIS — R059 Cough, unspecified: Secondary | ICD-10-CM

## 2013-12-25 DIAGNOSIS — G8929 Other chronic pain: Secondary | ICD-10-CM

## 2013-12-25 DIAGNOSIS — N390 Urinary tract infection, site not specified: Secondary | ICD-10-CM

## 2013-12-25 DIAGNOSIS — E1149 Type 2 diabetes mellitus with other diabetic neurological complication: Secondary | ICD-10-CM

## 2013-12-25 DIAGNOSIS — E669 Obesity, unspecified: Secondary | ICD-10-CM

## 2013-12-25 DIAGNOSIS — Z993 Dependence on wheelchair: Secondary | ICD-10-CM

## 2013-12-25 DIAGNOSIS — M25511 Pain in right shoulder: Secondary | ICD-10-CM

## 2013-12-25 DIAGNOSIS — G9340 Encephalopathy, unspecified: Secondary | ICD-10-CM | POA: Diagnosis present

## 2013-12-25 DIAGNOSIS — Z95818 Presence of other cardiac implants and grafts: Secondary | ICD-10-CM

## 2013-12-25 DIAGNOSIS — B372 Candidiasis of skin and nail: Secondary | ICD-10-CM

## 2013-12-25 DIAGNOSIS — J441 Chronic obstructive pulmonary disease with (acute) exacerbation: Secondary | ICD-10-CM

## 2013-12-25 DIAGNOSIS — J4489 Other specified chronic obstructive pulmonary disease: Secondary | ICD-10-CM

## 2013-12-25 DIAGNOSIS — R05 Cough: Secondary | ICD-10-CM

## 2013-12-25 DIAGNOSIS — F319 Bipolar disorder, unspecified: Secondary | ICD-10-CM

## 2013-12-25 DIAGNOSIS — M129 Arthropathy, unspecified: Secondary | ICD-10-CM | POA: Diagnosis present

## 2013-12-25 DIAGNOSIS — Z888 Allergy status to other drugs, medicaments and biological substances status: Secondary | ICD-10-CM

## 2013-12-25 DIAGNOSIS — Z79899 Other long term (current) drug therapy: Secondary | ICD-10-CM

## 2013-12-25 DIAGNOSIS — Z7982 Long term (current) use of aspirin: Secondary | ICD-10-CM

## 2013-12-25 DIAGNOSIS — I279 Pulmonary heart disease, unspecified: Secondary | ICD-10-CM

## 2013-12-25 DIAGNOSIS — E119 Type 2 diabetes mellitus without complications: Secondary | ICD-10-CM

## 2013-12-25 DIAGNOSIS — R6521 Severe sepsis with septic shock: Secondary | ICD-10-CM

## 2013-12-25 DIAGNOSIS — J189 Pneumonia, unspecified organism: Secondary | ICD-10-CM

## 2013-12-25 DIAGNOSIS — B370 Candidal stomatitis: Secondary | ICD-10-CM

## 2013-12-25 DIAGNOSIS — E1049 Type 1 diabetes mellitus with other diabetic neurological complication: Secondary | ICD-10-CM

## 2013-12-25 DIAGNOSIS — K5900 Constipation, unspecified: Secondary | ICD-10-CM

## 2013-12-25 DIAGNOSIS — M7989 Other specified soft tissue disorders: Secondary | ICD-10-CM

## 2013-12-25 DIAGNOSIS — Z8542 Personal history of malignant neoplasm of other parts of uterus: Secondary | ICD-10-CM

## 2013-12-25 DIAGNOSIS — R32 Unspecified urinary incontinence: Secondary | ICD-10-CM

## 2013-12-25 DIAGNOSIS — R000 Tachycardia, unspecified: Secondary | ICD-10-CM

## 2013-12-25 DIAGNOSIS — N319 Neuromuscular dysfunction of bladder, unspecified: Secondary | ICD-10-CM | POA: Diagnosis present

## 2013-12-25 DIAGNOSIS — R197 Diarrhea, unspecified: Secondary | ICD-10-CM

## 2013-12-25 DIAGNOSIS — M949 Disorder of cartilage, unspecified: Secondary | ICD-10-CM

## 2013-12-25 DIAGNOSIS — G4700 Insomnia, unspecified: Secondary | ICD-10-CM

## 2013-12-25 DIAGNOSIS — G894 Chronic pain syndrome: Secondary | ICD-10-CM

## 2013-12-25 DIAGNOSIS — E781 Pure hyperglyceridemia: Secondary | ICD-10-CM

## 2013-12-25 DIAGNOSIS — I872 Venous insufficiency (chronic) (peripheral): Secondary | ICD-10-CM

## 2013-12-25 DIAGNOSIS — M810 Age-related osteoporosis without current pathological fracture: Secondary | ICD-10-CM | POA: Diagnosis present

## 2013-12-25 DIAGNOSIS — I2699 Other pulmonary embolism without acute cor pulmonale: Secondary | ICD-10-CM

## 2013-12-25 DIAGNOSIS — Z86718 Personal history of other venous thrombosis and embolism: Secondary | ICD-10-CM

## 2013-12-25 HISTORY — DX: Sepsis, unspecified organism: A41.9

## 2013-12-25 LAB — CBC WITH DIFFERENTIAL/PLATELET
BASOS ABS: 0 10*3/uL (ref 0.0–0.1)
BASOS PCT: 0 % (ref 0–1)
Eosinophils Absolute: 0 10*3/uL (ref 0.0–0.7)
Eosinophils Relative: 0 % (ref 0–5)
HCT: 33.6 % — ABNORMAL LOW (ref 36.0–46.0)
Hemoglobin: 10.6 g/dL — ABNORMAL LOW (ref 12.0–15.0)
Lymphocytes Relative: 10 % — ABNORMAL LOW (ref 12–46)
Lymphs Abs: 1.4 10*3/uL (ref 0.7–4.0)
MCH: 28.6 pg (ref 26.0–34.0)
MCHC: 31.5 g/dL (ref 30.0–36.0)
MCV: 90.6 fL (ref 78.0–100.0)
Monocytes Absolute: 1.9 10*3/uL — ABNORMAL HIGH (ref 0.1–1.0)
Monocytes Relative: 14 % — ABNORMAL HIGH (ref 3–12)
NEUTROS PCT: 76 % (ref 43–77)
Neutro Abs: 10.3 10*3/uL — ABNORMAL HIGH (ref 1.7–7.7)
PLATELETS: 151 10*3/uL (ref 150–400)
RBC: 3.71 MIL/uL — AB (ref 3.87–5.11)
RDW: 15.3 % (ref 11.5–15.5)
WBC: 13.6 10*3/uL — ABNORMAL HIGH (ref 4.0–10.5)

## 2013-12-25 LAB — URINE MICROSCOPIC-ADD ON

## 2013-12-25 LAB — URINALYSIS, ROUTINE W REFLEX MICROSCOPIC
BILIRUBIN URINE: NEGATIVE
GLUCOSE, UA: NEGATIVE mg/dL
Ketones, ur: NEGATIVE mg/dL
Nitrite: NEGATIVE
Protein, ur: 30 mg/dL — AB
Specific Gravity, Urine: 1.012 (ref 1.005–1.030)
Urobilinogen, UA: 0.2 mg/dL (ref 0.0–1.0)
pH: 5.5 (ref 5.0–8.0)

## 2013-12-25 LAB — COMPREHENSIVE METABOLIC PANEL
ALBUMIN: 3 g/dL — AB (ref 3.5–5.2)
ALK PHOS: 58 U/L (ref 39–117)
ALT: 10 U/L (ref 0–35)
AST: 12 U/L (ref 0–37)
BUN: 14 mg/dL (ref 6–23)
CO2: 28 mEq/L (ref 19–32)
Calcium: 8.3 mg/dL — ABNORMAL LOW (ref 8.4–10.5)
Chloride: 93 mEq/L — ABNORMAL LOW (ref 96–112)
Creatinine, Ser: 0.83 mg/dL (ref 0.50–1.10)
GFR calc Af Amer: 80 mL/min — ABNORMAL LOW (ref >90)
GFR calc non Af Amer: 69 mL/min — ABNORMAL LOW (ref >90)
Glucose, Bld: 297 mg/dL — ABNORMAL HIGH (ref 70–99)
POTASSIUM: 4.3 meq/L (ref 3.7–5.3)
Sodium: 135 mEq/L — ABNORMAL LOW (ref 137–147)
TOTAL PROTEIN: 7.1 g/dL (ref 6.0–8.3)
Total Bilirubin: 0.4 mg/dL (ref 0.3–1.2)

## 2013-12-25 LAB — MRSA PCR SCREENING: MRSA BY PCR: POSITIVE — AB

## 2013-12-25 LAB — PROTIME-INR
INR: 1.08 (ref 0.00–1.49)
Prothrombin Time: 13.8 seconds (ref 11.6–15.2)

## 2013-12-25 LAB — GLUCOSE, CAPILLARY: GLUCOSE-CAPILLARY: 239 mg/dL — AB (ref 70–99)

## 2013-12-25 LAB — INFLUENZA PANEL BY PCR (TYPE A & B)
H1N1FLUPCR: NOT DETECTED
Influenza A By PCR: NEGATIVE
Influenza B By PCR: NEGATIVE

## 2013-12-25 LAB — TROPONIN I

## 2013-12-25 LAB — CG4 I-STAT (LACTIC ACID): Lactic Acid, Venous: 2.33 mmol/L — ABNORMAL HIGH (ref 0.5–2.2)

## 2013-12-25 LAB — APTT: APTT: 33 s (ref 24–37)

## 2013-12-25 MED ORDER — ACETAMINOPHEN 650 MG RE SUPP
650.0000 mg | Freq: Once | RECTAL | Status: AC
  Administered 2013-12-25: 650 mg via RECTAL
  Filled 2013-12-25: qty 1

## 2013-12-25 MED ORDER — SODIUM CHLORIDE 0.9 % IV BOLUS (SEPSIS): 1000.0000 mL | INTRAVENOUS | Status: DC | PRN

## 2013-12-25 MED ORDER — SODIUM CHLORIDE 0.9 % IV SOLN: 250.0000 mL | INTRAVENOUS | Status: DC | PRN

## 2013-12-25 MED ORDER — SODIUM CHLORIDE 0.9 % IV SOLN
1000.0000 mL | Freq: Once | INTRAVENOUS | Status: AC
  Administered 2013-12-25: 1000 mL via INTRAVENOUS

## 2013-12-25 MED ORDER — HEPARIN SODIUM (PORCINE) 5000 UNIT/ML IJ SOLN
5000.0000 [IU] | Freq: Three times a day (TID) | INTRAMUSCULAR | Status: DC
  Administered 2013-12-25 – 2014-01-01 (×19): 5000 [IU] via SUBCUTANEOUS
  Filled 2013-12-25 (×23): qty 1

## 2013-12-25 MED ORDER — NOREPINEPHRINE BITARTRATE 1 MG/ML IJ SOLN
5.0000 ug/min | INTRAMUSCULAR | Status: DC
  Administered 2013-12-25: 5 ug/min via INTRAVENOUS
  Filled 2013-12-25: qty 4

## 2013-12-25 MED ORDER — ASPIRIN 81 MG PO CHEW: 324.0000 mg | CHEWABLE_TABLET | ORAL | Status: DC

## 2013-12-25 MED ORDER — DEXTROSE 5 % IV SOLN
1.0000 g | Freq: Three times a day (TID) | INTRAVENOUS | Status: DC
  Administered 2013-12-25 – 2013-12-28 (×7): 1 g via INTRAVENOUS
  Filled 2013-12-25 (×12): qty 1

## 2013-12-25 MED ORDER — IPRATROPIUM-ALBUTEROL 0.5-2.5 (3) MG/3ML IN SOLN
3.0000 mL | Freq: Four times a day (QID) | RESPIRATORY_TRACT | Status: DC
  Administered 2013-12-25 – 2013-12-29 (×12): 3 mL via RESPIRATORY_TRACT
  Filled 2013-12-25 (×40): qty 3

## 2013-12-25 MED ORDER — OSELTAMIVIR PHOSPHATE 75 MG PO CAPS
75.0000 mg | ORAL_CAPSULE | Freq: Once | ORAL | Status: DC
  Filled 2013-12-25: qty 1

## 2013-12-25 MED ORDER — SODIUM CHLORIDE 0.9 % IV SOLN
Freq: Once | INTRAVENOUS | Status: AC
  Administered 2013-12-25: 16:00:00 via INTRAVENOUS

## 2013-12-25 MED ORDER — ACETAMINOPHEN 650 MG RE SUPP
650.0000 mg | Freq: Once | RECTAL | Status: AC
Start: 2013-12-25 — End: 2013-12-25
  Administered 2013-12-25: 650 mg via RECTAL
  Filled 2013-12-25: qty 1

## 2013-12-25 MED ORDER — VANCOMYCIN HCL 10 G IV SOLR
1500.0000 mg | Freq: Once | INTRAVENOUS | Status: AC
  Administered 2013-12-25: 1500 mg via INTRAVENOUS
  Filled 2013-12-25: qty 1500

## 2013-12-25 MED ORDER — DEXTROSE 5 % IV SOLN
1.0000 g | INTRAVENOUS | Status: AC
  Administered 2013-12-25: 1 g via INTRAVENOUS
  Filled 2013-12-25: qty 1

## 2013-12-25 MED ORDER — NOREPINEPHRINE BITARTRATE 1 MG/ML IJ SOLN
2.0000 ug/min | INTRAMUSCULAR | Status: DC
  Administered 2013-12-25: 2 ug/min via INTRAVENOUS
  Filled 2013-12-25: qty 4

## 2013-12-25 MED ORDER — ASPIRIN 300 MG RE SUPP: 300.0000 mg | RECTAL | Status: DC

## 2013-12-25 MED ORDER — VANCOMYCIN HCL IN DEXTROSE 1-5 GM/200ML-% IV SOLN
1000.0000 mg | Freq: Two times a day (BID) | INTRAVENOUS | Status: DC
  Administered 2013-12-26 (×2): 1000 mg via INTRAVENOUS
  Filled 2013-12-25 (×4): qty 200

## 2013-12-25 MED ORDER — ACETAMINOPHEN 325 MG PO TABS: 650.0000 mg | ORAL_TABLET | Freq: Once | ORAL | Status: DC

## 2013-12-25 MED ORDER — SODIUM CHLORIDE 0.9 % IV SOLN
1000.0000 mL | INTRAVENOUS | Status: DC
  Administered 2013-12-25: 1000 mL via INTRAVENOUS

## 2013-12-25 MED ORDER — SODIUM CHLORIDE 0.9 % IV SOLN
INTRAVENOUS | Status: DC
Start: 2013-12-25 — End: 2013-12-27
  Administered 2013-12-25 – 2013-12-27 (×2): via INTRAVENOUS

## 2013-12-25 MED ORDER — DEXTROSE 5 % IV SOLN
1.0000 g | Freq: Two times a day (BID) | INTRAVENOUS | Status: DC
  Filled 2013-12-25: qty 1

## 2013-12-25 NOTE — Progress Notes (Signed)
Unit CM UR Completed by [facility] ED CM  W. Coreyon Nicotra RN  

## 2013-12-25 NOTE — Procedures (Signed)
Central Venous Catheter Insertion Procedure Note [name] [id number] 1941-11-17  Procedure: Insertion of Central Venous Catheter Indications: Drug and/or fluid administration  Procedure Details Consent: Risks of procedure as well as the alternatives and risks of each were explained to the (patient/caregiver).  Consent for procedure obtained. Time Out: Verified patient identification, verified procedure, site/side was marked, verified correct patient position, special equipment/implants available, medications/allergies/relevent history reviewed, required imaging and test results available.  Performed  Maximum sterile technique was used including antiseptics, gloves, gown, hand hygiene, mask and sheet. Skin prep: Chlorhexidine; local anesthetic administered A antimicrobial bonded/coated triple lumen catheter was placed in the left internal jugular vein using the Seldinger technique.  Ultrasound was used to verify the patency of the vein and for real time needle guidance.  Evaluation Blood flow good Complications: No apparent complications Patient did tolerate procedure well. Chest X-ray ordered to verify placement.  CXR: pending.  MCQUAID, DOUGLAS 12/25/2013, 6:06 PM

## 2013-12-25 NOTE — ED Notes (Signed)
Timeout done by Danie Binder and Lake Bells MD

## 2013-12-25 NOTE — ED Notes (Signed)
Pt arrives via EMS from [place] living, facility states that pt has had a fever and productive cough since last night. Pt also has been tachycardic 120 HR and facility reports tht pt oxygen saturation was 70% on RA

## 2013-12-25 NOTE — Progress Notes (Addendum)
ANTIBIOTIC CONSULT NOTE - INITIAL  Pharmacy Consult:  Cefepime Indication:  Sepsis  Allergies  Allergen Reactions  . Avelox [Moxifloxacin Hcl In Nacl]   . Morphine And Related Other (See Comments)    Pain dysregulation Mood disruption  . Olanzapine     Hallucinations and disorientation  . Sulfamethoxazole-Trimethoprim Hives    Patient Measurements: Height: 5' 1.81" (157 cm) Weight: 202 lb 6.1 oz (91.8 kg) IBW/kg (Calculated) : 49.67  Vital Signs: Temp: 99.1 F (37.3 C) (01/06 1558) Temp src: Rectal (01/06 1434) BP: 79/40 mmHg (01/06 1615) Pulse Rate: 97 (01/06 1615)  Labs:  Recent Labs  12/25/13 1424  WBC 13.6*  HGB 10.6*  PLT 151  CREATININE 0.83   Estimated Creatinine Clearance: 65.3 ml/min (by C-G formula based on Cr of 0.83). No results found for this basename: VANCOTROUGH, VANCOPEAK, VANCORANDOM, GENTTROUGH, GENTPEAK, GENTRANDOM, TOBRATROUGH, TOBRAPEAK, TOBRARND, AMIKACINPEAK, AMIKACINTROU, AMIKACIN,  in the last 72 hours   Microbiology: No results found for this or any previous visit (from the past 720 hour(s)).  Medical History: Past Medical History  Diagnosis Date  . Anxiety   . Arthritis   . COPD (chronic obstructive pulmonary disease)   . GERD (gastroesophageal reflux disease)   . Hyperlipidemia   . Osteoporosis   . DVT (deep venous thrombosis)   . PE (pulmonary thromboembolism)   . Hypertension      PT DENIES....ON NO  MEDS  . Diabetes mellitus   . Cancer 1969    cervical  . Pneumonia     h/o  . Bronchitis     h/o  . Blood transfusion   . Anemia   . Bipolar affect, depressed       Assessment: 72 YOF from [place] presented to [facility] ED with fever and productive cough.  Pharmacy consulted to manage cefepime for sepsis.  Baseline labs reviewed.   Goal of Therapy:  Clearance of infection   Plan:  - Cefepime 1gm IV Q8H - Monitor renal fxn, clinical course - F/U Gram positive coverage    Deamber Buckhalter D. Mina Marble, PharmD,  BCPS Pager:  [phone] 12/25/2013, 4:32 PM   ========================================  Addendum: Add vancomycin to broaden coverage   Goal of Therapy: Vanc trough 15-20 mcg/mL   Plan: - Vanc 1500mg  IV x 1, then 1gm IV Q12H - Monitor renal fxn, vanc trough at Css    Drystan Reader D. Mina Marble, PharmD, BCPS Pager:  [phone] 12/25/2013, 8:06 PM

## 2013-12-25 NOTE — ED Notes (Signed)
NOTIFIED DR. Eulis Foster OF PATIENTS LAB RESULTS OF CG4 LACTIC ACID @15 :13 PM , 12/25/2013.

## 2013-12-25 NOTE — ED Notes (Addendum)
[name] (daughter) [phone]

## 2013-12-25 NOTE — H&P (Signed)
Name: [name] MRN: [id number] DOB: [birth year]    ADMISSION DATE:  12/25/2013 CONSULTATION DATE:  12/25/2013  REFERRING MD :  Daleen Bo, MD. PRIMARY SERVICE: PCCM  CHIEF COMPLAINT:  Weakness and fever.  BRIEF PATIENT DESCRIPTION: 73 Y O F from a nursing home was admitted on 1/7 with sepsis of a urinary source.  SIGNIFICANT EVENTS / STUDIES:   LINES / TUBES: Central Line- placed 12/25/2013.  CULTURES: Blood cultures- 12/25/2012. Urine cultures- 12/25/2012.  ANTIBIOTICS: IV cefepime  HISTORY OF PRESENT ILLNESS:  Hx could not be ascertained, as family was not present and patient was lethargic. Patient was brought in from a skilled nursing home- [facility], with complaints of fever and generalized weakness which started today. As per nursing home notes today patient had no complaints and was apparently doing ok. Patient got 4L of N/s in the Ed, without appreciable increase in blood pressure and so as started on Levofed infusion.  PAST MEDICAL HISTORY :  Past Medical History  Diagnosis Date  . Anxiety   . Arthritis   . COPD (chronic obstructive pulmonary disease)   . GERD (gastroesophageal reflux disease)   . Hyperlipidemia   . Osteoporosis   . DVT (deep venous thrombosis)   . PE (pulmonary thromboembolism)   . Hypertension      PT DENIES....ON NO  MEDS  . Diabetes mellitus   . Cancer 1969    cervical  . Pneumonia     h/o  . Bronchitis     h/o  . Blood transfusion   . Anemia   . Bipolar affect, depressed    Past Surgical History  Procedure Laterality Date  . Vena cava filter placement    . 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 Eye surgery      CATARACTS  . Achilles tendon lengthening  11/18/11    and repair w/posterior tibial tendon lengthening; right  foot  . Cholecystectomy    . Abdominal hysterectomy  1969    "womb taken out for cervical cancer"  . Cataract extraction, bilateral  ~ 2008  . Tonsillectomy      "as a  child"  . Achilles tendon surgery  11/18/2011    Procedure: ACHILLES TENDON REPAIR;  Surgeon: Wylene Simmer, MD;  Location: [facility];  Service: Orthopedics;  Laterality: Right;  Right Posterior Tibial Tendon Lenghtening and Tendon Achilles Lenghtening    Prior to Admission medications   Medication Sig Start Date End Date Taking? Authorizing Provider  acetaminophen (TYLENOL) 325 MG tablet Take 650 mg by mouth every 4 (four) hours as needed for mild pain.   Yes Historical Provider, MD  ALPRAZolam Duanne Moron) 1 MG tablet Take 1 mg by mouth 4 (four) times daily. At 6AM, 10AM, 2PM, and 6PM   Yes Historical Provider, MD  antiseptic oral rinse (BIOTENE) LIQD 15 mLs by Mouth Rinse route every 4 (four) hours as needed for dry mouth.   Yes Historical Provider, MD  Artificial Tear Ointment (REFRESH LACRI-LUBE) OINT Place 1 application into both eyes at bedtime.   Yes Historical Provider, MD  aspirin 81 MG chewable tablet Chew 81 mg by mouth daily.   Yes Historical Provider, MD  bisacodyl (DULCOLAX) 10 MG suppository Place 10 mg rectally 3 (three) times a week. On Monday, Wednesday, and Friday   Yes Historical Provider, MD  Calcium & Magnesium Carbonates (MYLANTA PO) Take 30 mLs by mouth every 4 (four) hours as needed (  indigestion).   Yes Historical Provider, MD  Carboxymeth-Glycerin-Polysorb (REFRESH OPTIVE ADVANCED OP) Place 1 drop into both eyes 4 (four) times daily.   Yes Historical Provider, MD  divalproex (DEPAKOTE ER) 250 MG 24 hr tablet Take 250 mg by mouth 2 (two) times daily.    Yes Historical Provider, MD  fish oil-omega-3 fatty acids 1000 MG capsule Take 1 g by mouth 2 (two) times daily.    Yes Historical Provider, MD  Fluticasone-Salmeterol (ADVAIR DISKUS) 500-50 MCG/DOSE AEPB Inhale 1 puff into the lungs 2 (two) times daily. 08/25/11  Yes Judithann Sheen, MD  gabapentin (NEURONTIN) 300 MG capsule Take 900 mg by mouth 3 (three) times daily. 01/31/12  Yes Waldemar Dickens, MD  HYDROcodone-acetaminophen  (NORCO/VICODIN) 5-325 MG per tablet Take 2 tablets by mouth every 8 (eight) hours. 06/29/13  Yes Claudette Jeri Cos, NP  insulin aspart (NOVOLOG) 100 UNIT/ML injection Inject 3 Units into the skin 3 (three) times daily before meals. 3 units prior to meals with an additional 5 units For cbg >=150 04/04/13  Yes Gerlene Fee, NP  insulin glargine (LANTUS) 100 UNIT/ML injection Inject 0.2 mLs (20 Units total) into the skin at bedtime. 04/04/13  Yes Gerlene Fee, NP  ipratropium-albuterol (DUONEB) 0.5-2.5 (3) MG/3ML SOLN Take 3 mLs by nebulization every 6 (six) hours.   Yes Historical Provider, MD  levalbuterol [facility] HFA) 45 MCG/ACT inhaler Inhale 2 puffs into the lungs every 4 (four) hours as needed for wheezing. 02/21/12  Yes Judithann Sheen, MD  loperamide (IMODIUM) 2 MG capsule Take 2 mg by mouth See admin instructions. Give 1 tablet after each loose stool PRN. Not to exceed 8mg  in 24 hours.   Yes Historical Provider, MD  Loperamide HCl (IMODIUM A-D PO) Take 4 mg by mouth See admin instructions. Give 1 tablet after first loose stool PRN   Yes Historical Provider, MD  Magnesium Hydroxide (MILK OF MAGNESIA PO) Take 30 mLs by mouth See admin instructions. If no BM in 3 days, give 95ml in a 24 hours PRN.   Yes Historical Provider, MD  menthol-cetylpyridinium (CEPACOL) 3 MG lozenge Take 1 lozenge by mouth every 4 (four) hours.   Yes Historical Provider, MD  metFORMIN (GLUCOPHAGE) 1000 MG tablet Take 1,000-1,500 mg by mouth 2 (two) times daily with a meal. 1500 mg in the am with breakfast and and 1000mg  before supper 06/02/11  Yes Judithann Sheen, MD  metoprolol tartrate (LOPRESSOR) 25 MG tablet Take 25 mg by mouth 2 (two) times daily.  12/27/11  Yes Judithann Sheen, MD  omeprazole (PRILOSEC) 20 MG capsule Take 20 mg by mouth 2 (two) times daily.    Yes Historical Provider, MD  OxyCODONE (OXYCONTIN) 10 mg T12A 12 hr tablet Take 1 tablet (10 mg total) by mouth every 12 (twelve) hours. 12/07/13  Yes Tiffany L  Reed, DO  polyethylene glycol (MIRALAX / GLYCOLAX) packet Take 17 g by mouth daily.     Yes Historical Provider, MD  potassium chloride (K-DUR,KLOR-CON) 10 MEQ tablet Take 5 mEq by mouth every evening.  08/25/11  Yes Judithann Sheen, MD  promethazine (PHENERGAN) 25 MG tablet Take 25 mg by mouth every 6 (six) hours as needed. nausea   Yes Historical Provider, MD  QUEtiapine (SEROQUEL) 50 MG tablet Take 75-200 mg by mouth 2 (two) times daily. 75mg  in the morning and 200mg  at bedtime.   Yes Historical Provider, MD  sennosides-docusate sodium (SENOKOT-S) 8.6-50 MG tablet Take 1 tablet by mouth  daily as needed for constipation.    Yes Historical Provider, MD  sennosides-docusate sodium (SENOKOT-S) 8.6-50 MG tablet Take 2 tablets by mouth at bedtime.   Yes Historical Provider, MD  simvastatin (ZOCOR) 10 MG tablet Take 10 mg by mouth at bedtime.   Yes Historical Provider, MD  sodium phosphate (FLEET) enema Place 1 enema rectally daily as needed (constipation). follow package directions   Yes Historical Provider, MD  tiotropium (SPIRIVA) 18 MCG inhalation capsule Place 1 capsule (18 mcg total) into inhaler and inhale daily. 08/25/11  Yes Judithann Sheen, MD  torsemide (DEMADEX) 20 MG tablet Take 40 mg by mouth every morning.   Yes Historical Provider, MD  traZODone (DESYREL) 100 MG tablet Take 100 mg by mouth at bedtime.   Yes Historical Provider, MD  traZODone (DESYREL) 50 MG tablet Take 25 mg by mouth daily as needed (anxiety).  01/31/12  Yes Waldemar Dickens, MD  venlafaxine XR (EFFEXOR-XR) 150 MG 24 hr capsule Take 150 mg by mouth every morning.  01/31/12  Yes Waldemar Dickens, MD   Allergies  Allergen Reactions  . Benzodiazepines Other (See Comments)    Mood dysregulation  (on MAR)  . Morphine And Related Other (See Comments)    Pain dysregulation Mood disruption  . Olanzapine     Hallucinations and disorientation  . Sulfamethoxazole-Trimethoprim Hives    FAMILY HISTORY:  No family history on  file. SOCIAL HISTORY:  reports that she quit smoking about 27 years ago. Her smoking use included Cigarettes. She smoked 1.00 pack per day. She has never used smokeless tobacco. She reports that she does not drink alcohol or use illicit drugs.  REVIEW OF SYSTEMS:  Could not be ascertained.   VITAL SIGNS: Temp:  [96.2 F (35.7 C)-102.4 F (39.1 C)] 96.2 F (35.7 C) (01/06 2015) Pulse Rate:  [86-127] 87 (01/06 2015) Resp:  [13-28] 15 (01/06 2015) BP: (74-117)/(33-65) 115/52 mmHg (01/06 2015) SpO2:  [96 %-100 %] 100 % (01/06 2015) Weight:  [202 lb (91.627 kg)-207 lb 0.2 oz (93.9 kg)] 207 lb 0.2 oz (93.9 kg) (01/06 1900) HEMODYNAMICS:   VENTILATOR SETTINGS:   INTAKE / OUTPUT: Intake/Output   None     PHYSICAL EXAMINATION: GENERAL- Lethargic, lying in bed, does not appear to be in any distress. HEENT- NCAT, EOMI, oral mucosa appears dry. CARDIAC- RRR, no murmurs, rubs or gallops. RESP- Moving equal volumes of air, and clear to auscultation bilaterally- No rales or wheezes. ABDOMEN- Soft,non tender,no palpable masses or organomegaly, bowel sounds reduced. NEURO- No obvious facial assymmetry EXTREMITIES- pulse 2+, symmetric. SKIN- Warm, dry, No rash or lesion.  LABS:  CBC  Recent Labs Lab 12/25/13 1424  WBC 13.6*  HGB 10.6*  HCT 33.6*  PLT 151   Coag's  Recent Labs Lab 12/25/13 1746  APTT 33  INR 1.08   BMET  Recent Labs Lab 12/25/13 1424  NA 135*  K 4.3  CL 93*  CO2 28  BUN 14  CREATININE 0.83  GLUCOSE 297*   Electrolytes  Recent Labs Lab 12/25/13 1424  CALCIUM 8.3*   Sepsis Markers  Recent Labs Lab 12/25/13 1510  LATICACIDVEN 2.33*   ABG No results found for this basename: PHART, PCO2ART, PO2ART,  in the last 168 hours Liver Enzymes  Recent Labs Lab 12/25/13 1424  AST 12  ALT 10  ALKPHOS 58  BILITOT 0.4  ALBUMIN 3.0*   Cardiac Enzymes No results found for this basename: TROPONINI, PROBNP,  in the last 168  hours  Glucose  Recent Labs Lab 12/25/13 1930  GLUCAP 239*    Imaging Dg Chest Port 1 View  12/25/2013   CLINICAL DATA:  Code sepsis.  EXAM: PORTABLE CHEST - 1 VIEW  COMPARISON:  Portable chest x-ray of December 25, 2013 at 1434 hr.  FINDINGS: The lungs remain mildly hypoinflated. The interstitial markings remain mildly increased. The hemidiaphragms are less well demonstrated currently. The cardiac silhouette remains enlarged. The pulmonary vascularity is somewhat indistinct though stable. No alveolar infiltrate is demonstrated. No large pleural effusion is demonstrated. There is a left internal jugular venous catheter in place whose tip lies in the region of the mid to distal SVC.  IMPRESSION: There has not been significant interval change in the appearance of the chest since the earlier study. Bilateral pulmonary hypo inflation accentuates the interstitial markings. No discrete alveolar pneumonia is demonstrated.   Electronically Signed   By: David  Martinique   On: 12/25/2013 18:39   Dg Chest Port 1 View  12/25/2013   CLINICAL DATA:  Fever and weakness.  EXAM: PORTABLE CHEST - 1 VIEW  COMPARISON:  PA and lateral chest 08/07/2012.  FINDINGS: Lung volumes are low with mild atelectasis in the bases. There is cardiomegaly without edema. No pneumothorax or pleural effusion.  IMPRESSION: No acute finding in a low volume chest.   Electronically Signed   By: Inge Rise M.D.   On: 12/25/2013 15:02    CXR: No acute abnormality.  ASSESSMENT / PLAN:  PULMONARY A: COPD not in exacerbation  P:   - Continue home meds- Duonebs- Q6H   CARDIOVASCULAR A: Septic shock- Focus- Urinary. P:  - Central line, to moniotor CVPs.  - IVF n/s - Continue levophed for MAP > 65 per sepsis protocol. - Call family and nursing home to ascertain history.  RENAL A:   P:    GASTROINTESTINAL A:  No abnormality. P:   NPO for now, considering Acute encephalopaghy  HEMATOLOGIC A: Leukocytosis- likely due to  sepsis- from UTI. - Anemia- appears chronic and at baseline. P:  - Follow CBCs  INFECTIOUS A:  Septic shock from UTI  P:   - Early goal directed treatment via Sepsis protocol - Urine cultures pending  - Blood cultures pending - Influenza panel  ENDOCRINE A: DM   P:   Hyperglycemic protocol  NEUROLOGIC A:  Acute encephalopthy- Likely due to Sepsis Vs poly pharmacy (Patient is on a lot of psychoactive medications) P:   - Hold home psych meds. - Treat acute infection.  TODAY'S SUMMARY: Patient is been treated for septic shock for UTI, with IV cefepime. Central line placed to appropriately monitor CVPs. Patient is on pressors- Levofed.  Attending:  I have seen and examined the patient with nurse practitioner/resident and agree with the note above.    I have personally obtained a history, examined the patient, evaluated laboratory and imaging results, formulated the assessment and plan and placed orders. CRITICAL CARE: The patient is critically ill with multiple organ systems failure and requires high complexity decision making for assessment and support, frequent evaluation and titration of therapies, application of advanced monitoring technologies and extensive interpretation of multiple databases. Critical Care Time devoted to patient care services described in this note is 35 minutes.   Jillyn Hidden PCCM Pager: [phone] Cell: [phone] If no response, call [phone]   12/25/2013, 8:24 PM

## 2013-12-25 NOTE — ED Notes (Signed)
Patient belongings bagged up and labeled sent to floor with pt, pt necklace taken and put inside specimen container and labeled.

## 2013-12-25 NOTE — ED Provider Notes (Signed)
CSN: [id number]     Arrival date & time 12/25/13  1410 History   First MD Initiated Contact with Patient 12/25/13 1423     Chief Complaint  Patient presents with  . Code Sepsis   (Consider location/radiation/quality/duration/timing/severity/associated sxs/prior Treatment) HPI Comments: [name] is a 73 y.o. female sent in for evaluation of fever. She apparently developed a fever today and has generalized weakness. She lives in a skilled nursing facility. She is unable to give history.   Level V caveat: Severe illness   The history is provided by the patient and the nursing home.    Past Medical History  Diagnosis Date  . Anxiety   . Arthritis   . COPD (chronic obstructive pulmonary disease)   . GERD (gastroesophageal reflux disease)   . Hyperlipidemia   . Osteoporosis   . DVT (deep venous thrombosis)   . PE (pulmonary thromboembolism)   . Hypertension      PT DENIES....ON NO  MEDS  . Diabetes mellitus   . Cancer 1969    cervical  . Pneumonia     h/o  . Bronchitis     h/o  . Blood transfusion   . Anemia   . Bipolar affect, depressed    Past Surgical History  Procedure Laterality Date  . Vena cava filter placement    . 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 Eye surgery      CATARACTS  . Achilles tendon lengthening  11/18/11    and repair w/posterior tibial tendon lengthening; right  foot  . Cholecystectomy    . Abdominal hysterectomy  1969    "womb taken out for cervical cancer"  . Cataract extraction, bilateral  ~ 2008  . Tonsillectomy      "as a child"  . Achilles tendon surgery  11/18/2011    Procedure: ACHILLES TENDON REPAIR;  Surgeon: Wylene Simmer, MD;  Location: [facility];  Service: Orthopedics;  Laterality: Right;  Right Posterior Tibial Tendon Lenghtening and Tendon Achilles Lenghtening    No family history on file. History  Substance Use Topics  . Smoking status: Former Smoker -- 1.00 packs/day    Types: Cigarettes    Quit date: 11/14/1986   . Smokeless tobacco: Never Used  . Alcohol Use: No   OB History   Grav Para Term Preterm Abortions TAB SAB Ect Mult Living                 Review of Systems  All other systems reviewed and are negative.    Allergies  Avelox; Morphine and related; Olanzapine; and Sulfamethoxazole-trimethoprim  Home Medications   Current Outpatient Rx  Name  Route  Sig  Dispense  Refill  . ALPRAZolam (XANAX) 1 MG tablet      Take 1 tablet four times a day . Take 1 tablet daily at 3 PM as needed for increased anxiety   210 tablet   5   . aspirin 81 MG tablet   Oral   Take 81 mg by mouth daily.         . bisacodyl (DULCOLAX) 10 MG suppository   Rectal   Place 10 mg rectally 3 (three) times a week.         . chlorproMAZINE (THORAZINE) 25 MG tablet   Oral   Take 25 mg by mouth 3 (three) times daily.         . divalproex (DEPAKOTE ER) 250 MG 24 hr tablet  Oral   Take 250 mg by mouth 2 (two) times daily.          . fish oil-omega-3 fatty acids 1000 MG capsule   Oral   Take 1 g by mouth daily.         . Fluticasone-Salmeterol (ADVAIR DISKUS) 500-50 MCG/DOSE AEPB   Inhalation   Inhale 1 puff into the lungs 2 (two) times daily.   60 each   11   . gabapentin (NEURONTIN) 300 MG capsule   Oral   Take 900 mg by mouth 3 (three) times daily.         Marland Kitchen HYDROcodone-acetaminophen (NORCO/VICODIN) 5-325 MG per tablet   Oral   Take 2 tablets by mouth every 8 (eight) hours.   90 tablet   5   . insulin aspart (NOVOLOG) 100 UNIT/ML injection   Subcutaneous   Inject 3 Units into the skin 3 (three) times daily before meals. 3 units prior to meals with an additional 5 units For cbg >=150   1 vial   99   . insulin glargine (LANTUS) 100 UNIT/ML injection   Subcutaneous   Inject 0.2 mLs (20 Units total) into the skin at bedtime.   10 mL   99   . levalbuterol (XOPENEX HFA) 45 MCG/ACT inhaler   Inhalation   Inhale 2 puffs into the lungs every 4 (four) hours as needed for  wheezing.         . metFORMIN (GLUCOPHAGE) 1000 MG tablet   Oral   Take 1,000 mg by mouth 2 (two) times daily with a meal. 1500 mg in the am and and 1 gm in the pm         . methocarbamol (ROBAXIN) 500 MG tablet   Oral   Take 500 mg by mouth every 6 (six) hours as needed.         . metoprolol tartrate (LOPRESSOR) 25 MG tablet   Oral   Take 12.5 mg by mouth 2 (two) times daily.         Marland Kitchen omeprazole (PRILOSEC) 20 MG capsule   Oral   Take 20 mg by mouth daily.         . OxyCODONE (OXYCONTIN) 10 mg T12A 12 hr tablet   Oral   Take 1 tablet (10 mg total) by mouth every 12 (twelve) hours.   60 tablet   0   . polyethylene glycol (MIRALAX / GLYCOLAX) packet   Oral   Take 17 g by mouth daily.           . potassium chloride (K-DUR,KLOR-CON) 10 MEQ tablet   Oral   Take 5 mEq by mouth daily. Takes 89meq in the am and 5 meq at bedtime         . promethazine (PHENERGAN) 25 MG tablet   Oral   Take 25 mg by mouth every 6 (six) hours as needed. nausea         . QUEtiapine (SEROQUEL) 25 MG tablet   Oral   Take 50-200 mg by mouth 2 (two) times daily. Takes 200 mg nightly and 50 mg in the am         . sennosides-docusate sodium (SENOKOT-S) 8.6-50 MG tablet   Oral   Take 2 tablets by mouth daily. And one tab daily as needed         . simvastatin (ZOCOR) 10 MG tablet   Oral   Take 10 mg by mouth at bedtime.         Marland Kitchen  tiotropium (SPIRIVA) 18 MCG inhalation capsule   Inhalation   Place 1 capsule (18 mcg total) into inhaler and inhale daily.   30 capsule   11   . torsemide (DEMADEX) 10 MG tablet   Oral   Take 40 mg by mouth daily.         . traZODone (DESYREL) 50 MG tablet   Oral   Take 100 mg by mouth at bedtime. May have an additional 25 mg nightly as needed         . venlafaxine XR (EFFEXOR-XR) 150 MG 24 hr capsule   Oral   Take 300 mg by mouth at bedtime.          BP 82/44  Pulse 92  Temp(Src) 99.1 F (37.3 C) (Rectal)  Resp 16  Ht 5\' 2"   (1.575 m)  Wt 202 lb (91.627 kg)  BMI 36.94 kg/m2  SpO2 99% Physical Exam  Nursing note and vitals reviewed. Constitutional: She appears well-developed.  Obese, frail, appears older than stated age  HENT:  Head: Normocephalic and atraumatic.  Dry mucous membranes  Eyes: Conjunctivae and EOM are normal. Pupils are equal, round, and reactive to light.  Neck: Normal range of motion and phonation normal. Neck supple.  Cardiovascular: Normal rate, regular rhythm and intact distal pulses.   Pulmonary/Chest: Effort normal and breath sounds normal. She exhibits no tenderness.  Abdominal: Soft. She exhibits no distension and no mass. There is no tenderness. There is no guarding.  Musculoskeletal: Normal range of motion. She exhibits edema (Bilateral).  Neurological: She is alert. She exhibits normal muscle tone.  She is lethargic. She is communicative, and answers questions accurately. There is no dysarthria, or dysphasia.  Skin: Skin is warm and dry.  Psychiatric: She has a normal mood and affect. Her behavior is normal.    ED Course  Procedures (including critical care time)  Medications  0.9 %  sodium chloride infusion (0 mLs Intravenous Stopped 12/25/13 1707)    Followed by  0.9 %  sodium chloride infusion (0 mLs Intravenous Stopped 12/25/13 1707)    Followed by  0.9 %  sodium chloride infusion (1,000 mLs Intravenous New Bag/Given 12/25/13 1706)  oseltamivir (TAMIFLU) capsule 75 mg (75 mg Oral Not Given 12/25/13 1610)  ceFEPIme (MAXIPIME) 1 g in dextrose 5 % 50 mL IVPB (not administered)  norepinephrine (LEVOPHED) 4 mg in dextrose 5 % 250 mL infusion (not administered)  ceFEPIme (MAXIPIME) 1 g in dextrose 5 % 50 mL IVPB (0 g Intravenous Stopped 12/25/13 1524)  acetaminophen (TYLENOL) suppository 650 mg (650 mg Rectal Given 12/25/13 1502)  0.9 %  sodium chloride infusion ( Intravenous New Bag/Given 12/25/13 1548)  acetaminophen (TYLENOL) suppository 650 mg (650 mg Rectal Given 12/25/13 1546)  0.9 %   sodium chloride infusion ( Intravenous New Bag/Given 12/25/13 1557)     Patient Vitals for the past 24 hrs:  BP Temp Temp src Pulse Resp SpO2 Height Weight  12/25/13 1700 82/44 mmHg - - 92 16 99 % - -  12/25/13 1649 - - - - - - 5\' 2"  (1.575 m) 202 lb (91.627 kg)  12/25/13 1645 86/45 mmHg - - 93 16 99 % - -  12/25/13 1630 83/41 mmHg - - 95 17 99 % - -  12/25/13 1615 79/40 mmHg - - 97 17 98 % 5' 1.81" (1.57 m) 202 lb 6.1 oz (91.8 kg)  12/25/13 1600 92/65 mmHg - - 101 18 97 % - -  12/25/13 1558 - 99.1  F (37.3 C) - - - - - -  12/25/13 1555 82/41 mmHg - - 102 18 96 % - -  12/25/13 1552 85/44 mmHg - - 101 17 96 % - -  12/25/13 1545 81/41 mmHg - - 102 17 97 % - -  12/25/13 1540 74/33 mmHg - - 102 19 97 % - -  12/25/13 1539 78/42 mmHg - - - - - - -  12/25/13 1520 90/41 mmHg - - 104 - 98 % - -  12/25/13 1512 92/40 mmHg - - 103 26 96 % - -  12/25/13 1502 - - - - - 98 % - -  12/25/13 1434 - 102.4 F (39.1 C) Rectal - - - - -  12/25/13 1412 116/43 mmHg 102.4 F (39.1 C) Oral 127 28 100 % - -   3:40 PM Reevaluation with update and discussion. After initial assessment and treatment, an updated evaluation reveals she remains alert and states that she feels better. Blood pressure is now 74/42 in the right arm, automated. Chest x-ray is normal. Lactate is reassuring. I do not believe that she needs vasopressors, at this time. Amandeep Hogston L   IV fluids, ordered for volume depletion and hypotension- 4 L  16:40- she remains hypotensive after 4 L of normal saline. We'll start vasopressors  Case discussed with pulmonary critical care, to arrange admission to the intensive care unit  CRITICAL CARE Performed by: Richarda Blade Total critical care time: 55 minutes Critical care time was exclusive of separately billable procedures and treating other patients. Critical care was necessary to treat or prevent imminent or life-threatening deterioration. Critical care was time spent personally by me on  the following activities: development of treatment plan with patient and/or surrogate as well as nursing, discussions with consultants, evaluation of patient's response to treatment, examination of patient, obtaining history from patient or surrogate, ordering and performing treatments and interventions, ordering and review of laboratory studies, ordering and review of radiographic studies, pulse oximetry and re-evaluation of patient's condition.   Labs Review Labs Reviewed  CBC WITH DIFFERENTIAL - Abnormal; Notable for the following:    WBC 13.6 (*)    RBC 3.71 (*)    Hemoglobin 10.6 (*)    HCT 33.6 (*)    Neutro Abs 10.3 (*)    Lymphocytes Relative 10 (*)    Monocytes Relative 14 (*)    Monocytes Absolute 1.9 (*)    All other components within normal limits  COMPREHENSIVE METABOLIC PANEL - Abnormal; Notable for the following:    Sodium 135 (*)    Chloride 93 (*)    Glucose, Bld 297 (*)    Calcium 8.3 (*)    Albumin 3.0 (*)    GFR calc non Af Amer 69 (*)    GFR calc Af Amer 80 (*)    All other components within normal limits  URINALYSIS, ROUTINE W REFLEX MICROSCOPIC - Abnormal; Notable for the following:    APPearance TURBID (*)    Hgb urine dipstick LARGE (*)    Protein, ur 30 (*)    Leukocytes, UA LARGE (*)    All other components within normal limits  URINE MICROSCOPIC-ADD ON - Abnormal; Notable for the following:    Bacteria, UA MANY (*)    All other components within normal limits  CG4 I-STAT (LACTIC ACID) - Abnormal; Notable for the following:    Lactic Acid, Venous 2.33 (*)    All other components within normal limits  CULTURE, BLOOD (ROUTINE X 2)  CULTURE, BLOOD (ROUTINE X 2)  URINE CULTURE  INFLUENZA PANEL BY PCR   Imaging Review Dg Chest Port 1 View  12/25/2013   CLINICAL DATA:  Fever and weakness.  EXAM: PORTABLE CHEST - 1 VIEW  COMPARISON:  PA and lateral chest 08/07/2012.  FINDINGS: Lung volumes are low with mild atelectasis in the bases. There is cardiomegaly  without edema. No pneumothorax or pleural effusion.  IMPRESSION: No acute finding in a low volume chest.   Electronically Signed   By: Inge Rise M.D.   On: 12/25/2013 15:02    EKG Interpretation    Date/Time:  Tuesday December 25 2013 14:30:53 EST Ventricular Rate:  120 PR Interval:  157 QRS Duration: 87 QT Interval:  329 QTC Calculation: 465 R Axis:   28 Text Interpretation:  Sinus tachycardia Low voltage, precordial leads since last tracing no significant change Confirmed by Chrishawn Kring  MD, Bear Osten ([id number]) on 12/25/2013 5:08:11 PM            MDM   1. Urosepsis   2. Septic shock     Nursing Notes Reviewed/ Care Coordinated, and agree without changes. Applicable Imaging Reviewed.  Interpretation of Laboratory Data incorporated into ED treatment  Plan: Admit    Richarda Blade, MD 12/25/13 1710

## 2013-12-26 DIAGNOSIS — N390 Urinary tract infection, site not specified: Secondary | ICD-10-CM

## 2013-12-26 LAB — CBC
HEMATOCRIT: 31.8 % — AB (ref 36.0–46.0)
HEMOGLOBIN: 9.7 g/dL — AB (ref 12.0–15.0)
MCH: 28.1 pg (ref 26.0–34.0)
MCHC: 30.5 g/dL (ref 30.0–36.0)
MCV: 92.2 fL (ref 78.0–100.0)
Platelets: 139 10*3/uL — ABNORMAL LOW (ref 150–400)
RBC: 3.45 MIL/uL — ABNORMAL LOW (ref 3.87–5.11)
RDW: 15.4 % (ref 11.5–15.5)
WBC: 9.3 10*3/uL (ref 4.0–10.5)

## 2013-12-26 LAB — BASIC METABOLIC PANEL
BUN: 8 mg/dL (ref 6–23)
CHLORIDE: 106 meq/L (ref 96–112)
CO2: 29 meq/L (ref 19–32)
CREATININE: 0.52 mg/dL (ref 0.50–1.10)
Calcium: 7.9 mg/dL — ABNORMAL LOW (ref 8.4–10.5)
GFR calc non Af Amer: >90 mL/min (ref >90)
GLUCOSE: 274 mg/dL — AB (ref 70–99)
POTASSIUM: 3.6 meq/L — AB (ref 3.7–5.3)
Sodium: 144 mEq/L (ref 137–147)

## 2013-12-26 LAB — GLUCOSE, CAPILLARY
Glucose-Capillary: 141 mg/dL — ABNORMAL HIGH (ref 70–99)
Glucose-Capillary: 165 mg/dL — ABNORMAL HIGH (ref 70–99)
Glucose-Capillary: 213 mg/dL — ABNORMAL HIGH (ref 70–99)
Glucose-Capillary: 216 mg/dL — ABNORMAL HIGH (ref 70–99)
Glucose-Capillary: 249 mg/dL — ABNORMAL HIGH (ref 70–99)
Glucose-Capillary: 258 mg/dL — ABNORMAL HIGH (ref 70–99)

## 2013-12-26 LAB — CARBOXYHEMOGLOBIN
CARBOXYHEMOGLOBIN: 1.2 % (ref 0.5–1.5)
Methemoglobin: 0.6 % (ref 0.0–1.5)
O2 SAT: 71 %
TOTAL HEMOGLOBIN: 11.2 g/dL — AB (ref 12.0–16.0)

## 2013-12-26 LAB — CLOSTRIDIUM DIFFICILE BY PCR: Toxigenic C. Difficile by PCR: NEGATIVE

## 2013-12-26 MED ORDER — LORAZEPAM 2 MG/ML IJ SOLN
0.5000 mg | Freq: Once | INTRAMUSCULAR | Status: AC
  Administered 2013-12-26: 0.5 mg via INTRAVENOUS

## 2013-12-26 MED ORDER — ACETAMINOPHEN 10 MG/ML IV SOLN: 500.0000 mg | Freq: Four times a day (QID) | INTRAVENOUS | Status: DC | PRN

## 2013-12-26 MED ORDER — LORAZEPAM 2 MG/ML IJ SOLN
2.0000 mg | INTRAMUSCULAR | Status: DC | PRN
  Administered 2013-12-26: 2 mg via INTRAVENOUS
  Filled 2013-12-26 (×2): qty 1

## 2013-12-26 MED ORDER — ALPRAZOLAM 0.5 MG PO TABS
0.5000 mg | ORAL_TABLET | Freq: Four times a day (QID) | ORAL | Status: DC
Start: 2013-12-26 — End: 2013-12-27
  Administered 2013-12-26 – 2013-12-27 (×3): 0.5 mg via ORAL
  Filled 2013-12-26 (×3): qty 1

## 2013-12-26 MED ORDER — INSULIN ASPART 100 UNIT/ML ~~LOC~~ SOLN: 0.0000 [IU] | SUBCUTANEOUS | Status: DC | PRN

## 2013-12-26 MED ORDER — LORAZEPAM 2 MG/ML IJ SOLN
INTRAMUSCULAR | Status: AC
  Administered 2013-12-26: 1 mg
  Filled 2013-12-26: qty 1

## 2013-12-26 MED ORDER — QUETIAPINE FUMARATE 200 MG PO TABS
200.0000 mg | ORAL_TABLET | Freq: Every day | ORAL | Status: DC
  Administered 2013-12-26 – 2013-12-31 (×6): 200 mg via ORAL
  Filled 2013-12-26 (×7): qty 1

## 2013-12-26 MED ORDER — GABAPENTIN 300 MG PO CAPS
900.0000 mg | ORAL_CAPSULE | Freq: Three times a day (TID) | ORAL | Status: DC
  Administered 2013-12-26 – 2014-01-01 (×18): 900 mg via ORAL
  Filled 2013-12-26 (×21): qty 3

## 2013-12-26 MED ORDER — LORAZEPAM 2 MG/ML IJ SOLN
INTRAMUSCULAR | Status: AC
  Filled 2013-12-26: qty 1

## 2013-12-26 MED ORDER — OXYCODONE HCL 5 MG PO TABS
5.0000 mg | ORAL_TABLET | Freq: Four times a day (QID) | ORAL | Status: DC | PRN
  Administered 2013-12-27 – 2014-01-01 (×10): 5 mg via ORAL
  Filled 2013-12-26 (×11): qty 1

## 2013-12-26 MED ORDER — TRAZODONE HCL 100 MG PO TABS
100.0000 mg | ORAL_TABLET | Freq: Every day | ORAL | Status: DC
  Administered 2013-12-26 – 2013-12-31 (×6): 100 mg via ORAL
  Filled 2013-12-26 (×7): qty 1

## 2013-12-26 MED ORDER — FENTANYL CITRATE 0.05 MG/ML IJ SOLN
INTRAMUSCULAR | Status: AC
  Filled 2013-12-26: qty 2

## 2013-12-26 MED ORDER — DIVALPROEX SODIUM ER 250 MG PO TB24
250.0000 mg | ORAL_TABLET | Freq: Two times a day (BID) | ORAL | Status: DC
  Administered 2013-12-26 – 2014-01-01 (×13): 250 mg via ORAL
  Filled 2013-12-26 (×14): qty 1

## 2013-12-26 MED ORDER — FENTANYL CITRATE 0.05 MG/ML IJ SOLN
50.0000 ug | INTRAMUSCULAR | Status: DC | PRN
  Administered 2013-12-26 (×2): 100 ug via INTRAVENOUS
  Administered 2013-12-26: 50 ug via INTRAVENOUS
  Administered 2013-12-27: 100 ug via INTRAVENOUS
  Filled 2013-12-26 (×4): qty 2

## 2013-12-26 MED ORDER — QUETIAPINE FUMARATE 50 MG PO TABS
75.0000 mg | ORAL_TABLET | Freq: Two times a day (BID) | ORAL | Status: DC
  Filled 2013-12-26 (×2): qty 4

## 2013-12-26 MED ORDER — LORAZEPAM 2 MG/ML IJ SOLN
2.0000 mg | Freq: Once | INTRAMUSCULAR | Status: AC
  Administered 2013-12-26: 2 mg via INTRAVENOUS

## 2013-12-26 MED ORDER — FENTANYL CITRATE 0.05 MG/ML IJ SOLN
50.0000 ug | INTRAMUSCULAR | Status: DC | PRN
  Administered 2013-12-26: 50 ug via INTRAVENOUS

## 2013-12-26 MED ORDER — LORAZEPAM 2 MG/ML IJ SOLN
1.0000 mg | Freq: Four times a day (QID) | INTRAMUSCULAR | Status: DC | PRN
  Administered 2013-12-26: 1 mg via INTRAVENOUS

## 2013-12-26 MED ORDER — ACETAMINOPHEN 650 MG RE SUPP
650.0000 mg | Freq: Four times a day (QID) | RECTAL | Status: DC | PRN
  Administered 2013-12-26: 650 mg via RECTAL
  Filled 2013-12-26: qty 1

## 2013-12-26 MED ORDER — INSULIN ASPART 100 UNIT/ML ~~LOC~~ SOLN
0.0000 [IU] | SUBCUTANEOUS | Status: DC
  Administered 2013-12-26: 7 [IU] via SUBCUTANEOUS
  Administered 2013-12-26: 4 [IU] via SUBCUTANEOUS
  Administered 2013-12-26: 18:00:00 3 [IU] via SUBCUTANEOUS
  Administered 2013-12-26 – 2013-12-27 (×2): 7 [IU] via SUBCUTANEOUS
  Administered 2013-12-27: 3 [IU] via SUBCUTANEOUS

## 2013-12-26 MED ORDER — QUETIAPINE FUMARATE 50 MG PO TABS
75.0000 mg | ORAL_TABLET | Freq: Every day | ORAL | Status: DC
  Administered 2013-12-26 – 2014-01-01 (×7): 75 mg via ORAL
  Filled 2013-12-26 (×7): qty 1

## 2013-12-26 MED ORDER — VENLAFAXINE HCL ER 150 MG PO CP24
150.0000 mg | ORAL_CAPSULE | ORAL | Status: DC
  Administered 2013-12-26 – 2014-01-01 (×7): 150 mg via ORAL
  Filled 2013-12-26 (×8): qty 1

## 2013-12-26 NOTE — Progress Notes (Addendum)
NURSING PROGRESS NOTE  [name] [id number] Transfer Data: 12/26/2013 2:08 PM Attending Provider: Juanito Doom, MD PCP:No primary provider on file. Code Status: full   [name] is a 73 y.o. female patient transferred from [facility]  -No acute distress noted.  -No complaints of shortness of breath.  -No complaints of chest pain.   Cardiac Monitoring: Box # [id number] in place. Cardiac monitor yields:normal sinus rhythm.  Blood pressure 175/71, pulse 141, temperature 98.1 F (36.7 C), temperature source Oral, resp. rate 20, height 5\' 2"  (1.575 m), weight 93.7 kg (206 lb 9.1 oz), SpO2 95.00%.   IV Fluids:  IV in place, occlusive dsg intact without redness, IV cath internal jugular left, condition patent and no redness normal saline.   Allergies:  Benzodiazepines; Morphine and related; Olanzapine; and Sulfamethoxazole-trimethoprim  Past Medical History:   has a past medical history of Anxiety; Arthritis; COPD (chronic obstructive pulmonary disease); GERD (gastroesophageal reflux disease); Hyperlipidemia; Osteoporosis; DVT (deep venous thrombosis); PE (pulmonary thromboembolism); Hypertension; Diabetes mellitus; Cancer (1969); Pneumonia; Bronchitis; Blood transfusion; Anemia; and Bipolar affect, depressed.  Past Surgical History:   has past surgical history that includes Vena cava filter placement; CANCER OF WOMB; Eye surgery; Achilles tendon lengthening (11/18/11); Cholecystectomy; Abdominal hysterectomy (1969); Cataract extraction, bilateral (~ 2008); Tonsillectomy; and Achilles tendon surgery (11/18/2011).  Social History:   reports that she quit smoking about 27 years ago. Her smoking use included Cigarettes. She smoked 1.00 pack per day. She has never used smokeless tobacco. She reports that she does not drink alcohol or use illicit drugs.  Skin: Sacrum is red but blanchable, groin is red  Patient/Family orientated to room. Information packet given to patient/family. Admission  inpatient armband information verified with patient/family to include name and date of birth and placed on patient arm. Side rails up x 2, fall assessment and education completed with patient/family. Patient/family able to verbalize understanding of risk associated with falls and verbalized understanding to call for assistance before getting out of bed. Call light within reach. Patient/family able to voice and demonstrate understanding of unit orientation instructions.    Will continue to evaluate and treat per MD orders.  Joslyn Hy, MSN, RN, [employer]

## 2013-12-26 NOTE — Clinical Social Work Psychosocial (Signed)
Clinical Social Work Department BRIEF PSYCHOSOCIAL ASSESSMENT 12/26/2013  Patient:  [name], [name]     Account Number:  [id number]     Admit date:  12/25/2013  Clinical Social Worker:  Wylene Men  Date/Time:  12/26/2013 01:44 PM  Referred by:  Physician  Date Referred:  12/26/2013 Referred for  SNF Placement   Other Referral:   none   Interview type:  Other - See comment Other interview type:   patient and sister [name]    PSYCHOSOCIAL DATA Living Status:  FACILITY Admitted from facility:  [facility] Level of care:  [place] Primary support name:  [name] Primary support relationship to patient:  SIBLING Degree of support available:   strong    CURRENT CONCERNS Current Concerns  Post-Acute Placement   Other Concerns:   none    SOCIAL WORK ASSESSMENT / PLAN CSW assessed pt at bedside.  Pt was alert and oriented x4 throughout the assessment.  Pt sister, [name] was at bedside and remained in room and active in the assessment per pt request.  Pt was pleasant, but often demanding of sister re: pt wants (i.e. Puffs tissues, Extra gum, and soda).  Pt was also requesting that sister go to [employer] to shop for her prior to sister going home to rest.    Sister, [name], is main caregiver for pt.  [name] has a husband who is in his 26s and has right sided neuropathy. She takes care of him at home.  [name] stated that she had been awake since 3am this morning caring for both her husband and pt.  If [name] tries to leave, pt screams out. CSW encouraged [name] in self-care and that the hospital was equipped to care for pt needs.    Pt reports that she is from [facility] and has been there for a couple of years now.  Pt reports that upon dc, she will return to [facility].    CSW will contact [facility] to confirm this disposition.   Assessment/plan status:  Psychosocial Support/Ongoing Assessment of Needs Other assessment/ plan:   none    Information/referral to community resources:   SNF  caregiver support resources given to pt sister, [name]    PATIENT'S/FAMILY'S RESPONSE TO PLAN OF CARE: Pt and sister, [name] was appreciative of CSW assistance.

## 2013-12-26 NOTE — Progress Notes (Signed)
Inpatient Diabetes Program Recommendations  AACE/ADA: New Consensus Statement on Inpatient Glycemic Control (2013)  Target Ranges:  Prepandial:   less than 140 mg/dL      Peak postprandial:   less than 180 mg/dL (1-2 hours)      Critically ill patients:  140 - 180 mg/dL   Results for LAURELL, COALSON (MRN [id number]) as of 12/26/2013 14:00  Ref. Range 12/25/2013 19:30 12/25/2013 23:44 12/26/2013 04:37 12/26/2013 08:44 12/26/2013 11:22  Glucose-Capillary Latest Range: 70-99 mg/dL 239 (H) 249 (H) 258 (H) 216 (H) 213 (H)    Inpatient Diabetes Program Recommendations Insulin - Basal: Please consider ordering Lantus 15 units Q24H (starting now). HgbA1C: Please consider ordering an A1C to evaluate glycemic control over the past 2-3 months.  Thanks, Barnie Alderman, RN, MSN, CCRN Diabetes Coordinator Inpatient Diabetes Program [phone] (Team Pager) [phone] ([place] office) [phone] [facility] office)

## 2013-12-26 NOTE — Progress Notes (Signed)
Name: [name] MRN: [id number] DOB: 1941-11-13    ADMISSION DATE:  12/25/2013 CONSULTATION DATE:  12/25/2013  REFERRING MD :  Mancel Bale, MD. PRIMARY SERVICE: PCCM  CHIEF COMPLAINT:  Weakness and fever.  BRIEF PATIENT DESCRIPTION: 73 Y O F from a nursing home was admitted on 1/7 with severe sepsis of a urinary source. PMH - bipolar, chronic pain, COPD on O2  SIGNIFICANT EVENTS / STUDIES:   LINES / TUBES: Central Line- placed 12/25/2013.  CULTURES: Blood cultures- 12/25/2012. Urine cultures- 12/25/2012.  ANTIBIOTICS: IV cefepime  HISTORY OF PRESENT ILLNESS:  Hx could not be ascertained, as family was not present and patient was lethargic. Patient was brought in from a skilled nursing home- [facility], with complaints of fever and generalized weakness which started today. As per nursing home notes today patient had no complaints and was apparently doing ok. Patient got 4L of N/s in the Ed, without appreciable increase in blood pressure and so as started on Levofed infusion.     VITAL SIGNS: Temp:  [96.1 F (35.6 C)-102.4 F (39.1 C)] 99.6 F (37.6 C) (01/07 0700) Pulse Rate:  [86-137] 117 (01/07 0700) Resp:  [13-28] 15 (01/07 0700) BP: (74-155)/(33-108) 126/71 mmHg (01/07 0700) SpO2:  [96 %-100 %] 99 % (01/07 0700) Weight:  [202 lb (91.627 kg)-207 lb 0.2 oz (93.9 kg)] 206 lb 9.1 oz (93.7 kg) (01/07 0500) HEMODYNAMICS: CVP:  [10 mmHg-14 mmHg] 14 mmHg VENTILATOR SETTINGS:   INTAKE / OUTPUT: Intake/Output     01/06 0701 - 01/07 0700 01/07 0701 - 01/08 0700   I.V. (mL/kg) 2326.7 (24.8)    Total Intake(mL/kg) 2326.7 (24.8)    Urine (mL/kg/hr) 3700    Total Output 3700     Net -1373.3            PHYSICAL EXAMINATION: GENERAL- Awake, alert, in no acute distress.  HEENT- NCAT, EOMI. LIJ line in place.  CARDIAC- RRR, no murmurs, rubs or gallops. RESP- Moving equal volumes of air, and clear to auscultation bilaterally- No rales or  wheezes. ABDOMEN- Soft,non tender,no palpable masses or organomegaly, bowel sounds reduced. NEURO- No obvious facial assymmetry EXTREMITIES- pulse 2+, symmetric. +1 upper and lower extr edema. SKIN- Warm, dry, No rash or lesion.  LABS:  CBC  Recent Labs Lab 12/25/13 1424 12/26/13 0500  WBC 13.6* 9.3  HGB 10.6* 9.7*  HCT 33.6* 31.8*  PLT 151 139*   Coag's  Recent Labs Lab 12/25/13 1746  APTT 33  INR 1.08   BMET  Recent Labs Lab 12/25/13 1424 12/26/13 0500  NA 135* 144  K 4.3 3.6*  CL 93* 106  CO2 28 29  BUN 14 8  CREATININE 0.83 0.52  GLUCOSE 297* 274*   Electrolytes  Recent Labs Lab 12/25/13 1424 12/26/13 0500  CALCIUM 8.3* 7.9*   Sepsis Markers  Recent Labs Lab 12/25/13 1510  LATICACIDVEN 2.33*   ABG No results found for this basename: PHART, PCO2ART, PO2ART,  in the last 168 hours Liver Enzymes  Recent Labs Lab 12/25/13 1424  AST 12  ALT 10  ALKPHOS 58  BILITOT 0.4  ALBUMIN 3.0*   Cardiac Enzymes  Recent Labs Lab 12/25/13 2100  TROPONINI <0.30   Glucose  Recent Labs Lab 12/25/13 1930 12/25/13 2344 12/26/13 0437  GLUCAP 239* 249* 258*   Urinalysis    Component Value Date/Time   COLORURINE YELLOW 12/25/2013 1519   APPEARANCEUR TURBID* 12/25/2013 1519   LABSPEC 1.012 12/25/2013 1519  PHURINE 5.5 12/25/2013 1519   GLUCOSEU NEGATIVE 12/25/2013 1519   HGBUR LARGE* 12/25/2013 1519   HGBUR small 05/11/2007 1429   BILIRUBINUR NEGATIVE 12/25/2013 1519   KETONESUR NEGATIVE 12/25/2013 1519   PROTEINUR 30* 12/25/2013 1519   UROBILINOGEN 0.2 12/25/2013 1519   NITRITE NEGATIVE 12/25/2013 1519   LEUKOCYTESUR LARGE* 12/25/2013 1519    Imaging Dg Chest Port 1 View  12/25/2013   CLINICAL DATA:  Code sepsis.  EXAM: PORTABLE CHEST - 1 VIEW  COMPARISON:  Portable chest x-ray of December 25, 2013 at 1434 hr.  FINDINGS: The lungs remain mildly hypoinflated. The interstitial markings remain mildly increased. The hemidiaphragms are less well demonstrated  currently. The cardiac silhouette remains enlarged. The pulmonary vascularity is somewhat indistinct though stable. No alveolar infiltrate is demonstrated. No large pleural effusion is demonstrated. There is a left internal jugular venous catheter in place whose tip lies in the region of the mid to distal SVC.  IMPRESSION: There has not been significant interval change in the appearance of the chest since the earlier study. Bilateral pulmonary hypo inflation accentuates the interstitial markings. No discrete alveolar pneumonia is demonstrated.   Electronically Signed   By: David  [place]   On: 12/25/2013 18:39   Dg Chest Port 1 View  12/25/2013   CLINICAL DATA:  Fever and weakness.  EXAM: PORTABLE CHEST - 1 VIEW  COMPARISON:  PA and lateral chest 08/07/2012.  FINDINGS: Lung volumes are low with mild atelectasis in the bases. There is cardiomegaly without edema. No pneumothorax or pleural effusion.  IMPRESSION: No acute finding in a low volume chest.   Electronically Signed   By: Drusilla Kanner M.D.   On: 12/25/2013 15:02    CXR: No acute abnormality.  ASSESSMENT / PLAN:  PULMONARY A: COPD not in exacerbation  P:   -Continue home meds- Duonebs- Q6H   CARDIOVASCULAR A: Septic shock- Focus- Urinary. P:  - IVF n/s - Levophed off  RENAL A:  ? Neurogenic bladder (daughter concerned about urine retention) P:   - Consider bladder ultrasound after foley removed -urology input  GASTROINTESTINAL A:  No abnormality. P:   - Restart diet and home po medications  HEMATOLOGIC A: Leukocytosis- likely due to sepsis- from UTI. Resolved.  - Anemia- appears chronic and at baseline. P:  - Follow CBCs  INFECTIOUS A:  Septic shock from UTI. Resolved.   P:   - Continue Cefipime - Urine cultures pending  - Blood cultures pending - Influenza panel -ve  ENDOCRINE A: DM   P:   - Lantus 10 units (takes 20 at home) - ISS q4h - CBG's q4h  NEUROLOGIC A:  Acute encephalopthy- Likely due to  Sepsis Vs poly pharmacy (Patient is on a lot of psychoactive medications). Baseline unknown, appears improved from previous. P:   - Restarted home psych meds. Only started half dose of Xanax & at this time.  - Continue treating infection.     Signed: Lars Masson, MD 12/26/2013 7:51 AM  TODAY'S SUMMARY: Leukocytosis resolved, pressures stable off of Levophed. Will transfer to med-surg & to [facility] in am.  I have personally obtained a history, examined the patient, evaluated laboratory and imaging results, formulated the assessment and plan and placed orders. CRITICAL CARE: The patient is critically ill with multiple organ systems failure and requires high complexity decision making for assessment and support, frequent evaluation and titration of therapies, application of advanced monitoring technologies and extensive interpretation of multiple databases. Critical Care Time devoted to patient  care services described in this note is 31 minutes   Saory Carriero V.

## 2013-12-27 DIAGNOSIS — F319 Bipolar disorder, unspecified: Secondary | ICD-10-CM

## 2013-12-27 LAB — TROPONIN I: Troponin I: <0.3 ng/mL (ref <0.30)

## 2013-12-27 LAB — GLUCOSE, CAPILLARY
GLUCOSE-CAPILLARY: 207 mg/dL — AB (ref 70–99)
GLUCOSE-CAPILLARY: 227 mg/dL — AB (ref 70–99)
Glucose-Capillary: 149 mg/dL — ABNORMAL HIGH (ref 70–99)
Glucose-Capillary: 175 mg/dL — ABNORMAL HIGH (ref 70–99)
Glucose-Capillary: 213 mg/dL — ABNORMAL HIGH (ref 70–99)
Glucose-Capillary: 186 mg/dL — ABNORMAL HIGH (ref 70–99)
Glucose-Capillary: 223 mg/dL — ABNORMAL HIGH (ref 70–99)

## 2013-12-27 MED ORDER — INSULIN ASPART 100 UNIT/ML ~~LOC~~ SOLN
0.0000 [IU] | Freq: Every day | SUBCUTANEOUS | Status: DC
  Administered 2013-12-27: 22:00:00 2 [IU] via SUBCUTANEOUS
  Administered 2013-12-29 – 2013-12-30 (×2): 3 [IU] via SUBCUTANEOUS
  Administered 2013-12-31: 2 [IU] via SUBCUTANEOUS

## 2013-12-27 MED ORDER — INSULIN ASPART 100 UNIT/ML ~~LOC~~ SOLN
0.0000 [IU] | Freq: Three times a day (TID) | SUBCUTANEOUS | Status: DC
  Administered 2013-12-27: 13:00:00 5 [IU] via SUBCUTANEOUS
  Administered 2013-12-27 – 2013-12-28 (×2): 3 [IU] via SUBCUTANEOUS
  Administered 2013-12-28: 2 [IU] via SUBCUTANEOUS
  Administered 2013-12-28: 5 [IU] via SUBCUTANEOUS
  Administered 2013-12-29: 18:00:00 2 [IU] via SUBCUTANEOUS
  Administered 2013-12-29: 8 [IU] via SUBCUTANEOUS
  Administered 2013-12-29: 09:00:00 3 [IU] via SUBCUTANEOUS
  Administered 2013-12-30: 18:00:00 8 [IU] via SUBCUTANEOUS
  Administered 2013-12-30 – 2014-01-01 (×6): 5 [IU] via SUBCUTANEOUS
  Administered 2014-01-01: 8 [IU] via SUBCUTANEOUS

## 2013-12-27 MED ORDER — ALPRAZOLAM 0.5 MG PO TABS
1.0000 mg | ORAL_TABLET | Freq: Four times a day (QID) | ORAL | Status: DC
  Administered 2013-12-27 – 2014-01-01 (×21): 1 mg via ORAL
  Filled 2013-12-27 (×22): qty 2

## 2013-12-27 MED ORDER — METOPROLOL TARTRATE 1 MG/ML IV SOLN
5.0000 mg | Freq: Once | INTRAVENOUS | Status: AC
  Administered 2013-12-27: 01:00:00 5 mg via INTRAVENOUS
  Filled 2013-12-27: qty 5

## 2013-12-27 MED ORDER — INSULIN GLARGINE 100 UNIT/ML ~~LOC~~ SOLN
10.0000 [IU] | Freq: Every day | SUBCUTANEOUS | Status: DC
  Administered 2013-12-27 – 2013-12-28 (×2): 10 [IU] via SUBCUTANEOUS
  Filled 2013-12-27 (×2): qty 0.1

## 2013-12-27 MED ORDER — METOPROLOL TARTRATE 25 MG PO TABS
25.0000 mg | ORAL_TABLET | Freq: Two times a day (BID) | ORAL | Status: DC
  Administered 2013-12-27 – 2014-01-01 (×11): 25 mg via ORAL
  Filled 2013-12-27 (×13): qty 1

## 2013-12-27 NOTE — Progress Notes (Signed)
[facility] Progress Note Patient Name: [name] DOB: February 27, 1941 MRN: [id number]  Date of Service  12/27/2013   HPI/Events of Note   RN called with persistent tachycardia EKG >>> sinus tachycardia, non specific ST-T changes Metoprolol preadmission, not ordered currently Last SBP 150   eICU Interventions   Metoprolol 5 IV x 1 Metoprolol 25 PO bid Check troponin    Intervention Category Major Interventions: Arrhythmia - evaluation and management  Ashley Bultema 12/27/2013, 12:04 AM

## 2013-12-27 NOTE — Progress Notes (Signed)
Per MD order, central line removed. IV cathter intact. Vaseline pressure gauze to site, pressure held x 5 min, no bleeding to site. Pt instructed not to get out of bed for 30 min after the removal of the central line. Instucted to keep dressing CDI x 24 hours. If it starts to bleed hold pressure and contact the nurse. Pt verbalized understanding and did not have any questions. Catalina Pizza

## 2013-12-27 NOTE — Care Management Note (Unsigned)
    Page 1 of 1   12/27/2013     4:27:23 PM   CARE MANAGEMENT NOTE 12/27/2013  Patient:  [name], [name]   Account Number:  [id number]  Date Initiated:  12/26/2013  Documentation initiated by:  [facility]  Subjective/Objective Assessment:   Admitted from SNF - encephalopic - hypotensive.  On pressors.     Action/Plan:   Anticipated DC Date:  01/02/2014   Anticipated DC Plan:  SKILLED NURSING FACILITY  In-house referral  Clinical Social Worker      DC Planning Services  CM consult      Choice offered to / List presented to:             Status of service:  In process, will continue to follow Medicare Important Message given?   (If response is "NO", the following Medicare IM given date fields will be blank) Date Medicare IM given:   Date Additional Medicare IM given:    Discharge Disposition:    Per UR Regulation:  Reviewed for med. necessity/level of care/duration of stay  If discussed at [place] of Stay Meetings, dates discussed:    Comments:  Contact:  Wright,[name] Daughter   [phone]                 Dennie Fetters Sister [phone]                 Dunford,Denesha Brouse Daughter [phone]  12/27/13 16:26 Tomi Bamberger RN, BSN [phone] Patient is from [facility], and plans to return per CSW note.  CSW following.

## 2013-12-27 NOTE — Progress Notes (Signed)
[facility] PROGRESS NOTE  [name]:[id number] DOB: August 04, 1941 DOA: 12/25/2013 PCP: No primary provider on file.  Assessment/Plan: COPD not in exacerbation  -Continue home meds- Duonebs- Q6H   Septic shock- Focus- Urinary- e coli - d/c IVF - Levophed off -Continue Cefipime  - Urine cultures pending  - Blood cultures pending  - Influenza panel -ve   Neurogenic bladder (daughter concerned about urine retention)  - Consider bladder ultrasound after foley removed  -urology input if retaining    Leukocytosis- likely due to sepsis- from UTI. Resolved.  - Anemia- appears chronic and at baseline.  - Follow CBCs      DM  - Lantus 10 units (takes 20 at home)  -SSI TID with meald  Acute encephalopthy- Likely due to Sepsis Vs poly pharmacy (Patient is on a lot of psychoactive medications). Baseline unknown, appears improved from previous.  - Restarted home psych meds. Only started half dose of Xanax & at this time.  - Continue treating infection.   HTN/tachycardia -resume metoprolol -may need to increase xanax   Code Status: full Family Communication:  Disposition Plan: SNF   Consultants:    Procedures:    Antibiotics:  Cefepime 1/6  vanc 1/6-1/8  HPI/Subjective: Anxious about central line being in neck  Objective: Filed Vitals:   12/27/13 0630  BP: 149/95  Pulse: 123  Temp: 98.6 F (37 C)  Resp: 22    Intake/Output Summary (Last 24 hours) at 12/27/13 0812 Last data filed at 12/27/13 8657  Gross per 24 hour  Intake 2265.42 ml  Output   2075 ml  Net 190.42 ml   Filed Weights   12/25/13 1900 12/26/13 0500 12/27/13 0630  Weight: 93.9 kg (207 lb 0.2 oz) 93.7 kg (206 lb 9.1 oz) 95.5 kg (210 lb 8.6 oz)    Exam:  GENERAL- Awake, alert, in no acute distress.  CARDIAC- RRR, no murmurs, rubs or gallops.  RESP- decreased b/l bilaterally- No rales or wheezes.  ABDOMEN- Soft,non tender,no palpable masses or organomegaly, bowel sounds  reduced.  NEURO- No obvious facial assymmetry  EXTREMITIES- pulse 2+, symmetric. +1 upper and lower extr edema.  SKIN- Warm, dry, No rash or lesion.   Data Reviewed: Basic Metabolic Panel:  Recent Labs Lab 12/25/13 1424 12/26/13 0500  NA 135* 144  K 4.3 3.6*  CL 93* 106  CO2 28 29  GLUCOSE 297* 274*  BUN 14 8  CREATININE 0.83 0.52  CALCIUM 8.3* 7.9*   Liver Function Tests:  Recent Labs Lab 12/25/13 1424  AST 12  ALT 10  ALKPHOS 58  BILITOT 0.4  PROT 7.1  ALBUMIN 3.0*   No results found for this basename: LIPASE, AMYLASE,  in the last 168 hours No results found for this basename: AMMONIA,  in the last 168 hours CBC:  Recent Labs Lab 12/25/13 1424 12/26/13 0500  WBC 13.6* 9.3  NEUTROABS 10.3*  --   HGB 10.6* 9.7*  HCT 33.6* 31.8*  MCV 90.6 92.2  PLT 151 139*   Cardiac Enzymes:  Recent Labs Lab 12/25/13 2100 12/27/13 0050 12/27/13 0552  TROPONINI <0.30 <0.30 <0.30   BNP (last 3 results) No results found for this basename: PROBNP,  in the last 8760 hours CBG:  Recent Labs Lab 12/26/13 1122 12/26/13 1749 12/26/13 2011 12/27/13 0043 12/27/13 0443  GLUCAP 213* 141* 165* 227* 149*    Recent Results (from the past 240 hour(s))  CULTURE, BLOOD (ROUTINE X 2)     Status: None  Collection Time    12/25/13  2:45 PM      Result Value Range Status   Specimen Description BLOOD HAND RIGHT   Final   Special Requests     Final   Value: BOTTLES DRAWN AEROBIC AND ANAEROBIC 10CCBLUE 9CCRED   Culture  Setup Time     Final   Value: 12/25/2013 20:23     Performed at [facility]   Culture     Final   Value:        BLOOD CULTURE RECEIVED NO GROWTH TO DATE CULTURE WILL BE HELD FOR 5 DAYS BEFORE ISSUING A FINAL NEGATIVE REPORT     Performed at [facility]   Report Status PENDING   Incomplete  CULTURE, BLOOD (ROUTINE X 2)     Status: None   Collection Time    12/25/13  2:52 PM      Result Value Range Status   Specimen Description BLOOD  HAND LEFT   Final   Special Requests BOTTLES DRAWN AEROBIC ONLY 10CC   Final   Culture  Setup Time     Final   Value: 12/25/2013 20:23     Performed at [facility]   Culture     Final   Value:        BLOOD CULTURE RECEIVED NO GROWTH TO DATE CULTURE WILL BE HELD FOR 5 DAYS BEFORE ISSUING A FINAL NEGATIVE REPORT     Performed at [facility]   Report Status PENDING   Incomplete  URINE CULTURE     Status: None   Collection Time    12/25/13  3:19 PM      Result Value Range Status   Specimen Description URINE, CATHETERIZED   Final   Special Requests NONE   Final   Culture  Setup Time     Final   Value: 12/25/2013 20:28     Performed at [facility] Count     Final   Value: >=100,000 COLONIES/ML     Performed at [facility]   Culture     Final   Value: ESCHERICHIA COLI     Performed at [facility]   Report Status PENDING   Incomplete  MRSA PCR SCREENING     Status: Abnormal   Collection Time    12/25/13  7:38 PM      Result Value Range Status   MRSA by PCR POSITIVE (*) NEGATIVE Final   Comment:            The GeneXpert MRSA Assay (FDA     approved for NASAL specimens     only), is one component of a     comprehensive MRSA colonization     surveillance program. It is not     intended to diagnose MRSA     infection nor to guide or     monitor treatment for     MRSA infections.     RESULT CALLED TO, READ BACK BY AND VERIFIED WITH:     Graylin Shiver RN 1610 12/25/13 A BROWNING  CLOSTRIDIUM DIFFICILE BY PCR     Status: None   Collection Time    12/26/13  2:34 PM      Result Value Range Status   C difficile by pcr NEGATIVE  NEGATIVE Final     Studies: Dg Chest Port 1 View  12/25/2013   CLINICAL DATA:  Code sepsis.  EXAM: PORTABLE CHEST - 1 VIEW  COMPARISON:  Portable chest x-ray of December 25, 2013 at 1434 hr.  FINDINGS: The lungs remain mildly hypoinflated. The interstitial markings remain mildly increased. The hemidiaphragms are  less well demonstrated currently. The cardiac silhouette remains enlarged. The pulmonary vascularity is somewhat indistinct though stable. No alveolar infiltrate is demonstrated. No large pleural effusion is demonstrated. There is a left internal jugular venous catheter in place whose tip lies in the region of the mid to distal SVC.  IMPRESSION: There has not been significant interval change in the appearance of the chest since the earlier study. Bilateral pulmonary hypo inflation accentuates the interstitial markings. No discrete alveolar pneumonia is demonstrated.   Electronically Signed   By: David  [place]   On: 12/25/2013 18:39   Dg Chest Port 1 View  12/25/2013   CLINICAL DATA:  Fever and weakness.  EXAM: PORTABLE CHEST - 1 VIEW  COMPARISON:  PA and lateral chest 08/07/2012.  FINDINGS: Lung volumes are low with mild atelectasis in the bases. There is cardiomegaly without edema. No pneumothorax or pleural effusion.  IMPRESSION: No acute finding in a low volume chest.   Electronically Signed   By: Drusilla Kanner M.D.   On: 12/25/2013 15:02    Scheduled Meds: . ALPRAZolam  0.5 mg Oral QID  . ceFEPime (MAXIPIME) IV  1 g Intravenous Q8H  . divalproex  250 mg Oral BID  . gabapentin  900 mg Oral TID  . heparin  5,000 Units Subcutaneous Q8H  . insulin aspart  0-15 Units Subcutaneous TID WC  . insulin aspart  0-5 Units Subcutaneous QHS  . insulin glargine  10 Units Subcutaneous Daily  . ipratropium-albuterol  3 mL Nebulization Q6H  . metoprolol tartrate  25 mg Oral BID  . QUEtiapine  200 mg Oral QHS  . QUEtiapine  75 mg Oral Daily  . traZODone  100 mg Oral QHS  . venlafaxine XR  150 mg Oral BH-q7a   Continuous Infusions: . sodium chloride 75 mL/hr at 12/27/13 1610    Principal Problem:   Septic shock Active Problems:   DIABETES MELLITUS, TYPE II   UTI   Acute encephalopathy    Time spent: 35 min    [name]  [facility] Pager [phone] If 7PM-7AM, please contact  night-coverage at [email or web address], password [facility] 12/27/2013, 8:12 AM  LOS: 2 days

## 2013-12-28 DIAGNOSIS — J441 Chronic obstructive pulmonary disease with (acute) exacerbation: Secondary | ICD-10-CM

## 2013-12-28 LAB — GLUCOSE, CAPILLARY
GLUCOSE-CAPILLARY: 130 mg/dL — AB (ref 70–99)
GLUCOSE-CAPILLARY: 161 mg/dL — AB (ref 70–99)
Glucose-Capillary: 191 mg/dL — ABNORMAL HIGH (ref 70–99)
Glucose-Capillary: 217 mg/dL — ABNORMAL HIGH (ref 70–99)

## 2013-12-28 LAB — CBC
HCT: 32.9 % — ABNORMAL LOW (ref 36.0–46.0)
Hemoglobin: 9.9 g/dL — ABNORMAL LOW (ref 12.0–15.0)
MCH: 28 pg (ref 26.0–34.0)
MCHC: 30.1 g/dL (ref 30.0–36.0)
MCV: 93.2 fL (ref 78.0–100.0)
Platelets: 172 10*3/uL (ref 150–400)
RBC: 3.53 MIL/uL — ABNORMAL LOW (ref 3.87–5.11)
RDW: 15.1 % (ref 11.5–15.5)
WBC: 4.7 10*3/uL (ref 4.0–10.5)

## 2013-12-28 LAB — URINE CULTURE: Colony Count: >=100000

## 2013-12-28 LAB — BASIC METABOLIC PANEL
BUN: 3 mg/dL — AB (ref 6–23)
CALCIUM: 8.3 mg/dL — AB (ref 8.4–10.5)
CO2: 30 mEq/L (ref 19–32)
Chloride: 104 mEq/L (ref 96–112)
Creatinine, Ser: 0.46 mg/dL — ABNORMAL LOW (ref 0.50–1.10)
GFR calc Af Amer: >90 mL/min (ref >90)
GLUCOSE: 209 mg/dL — AB (ref 70–99)
Potassium: 3.5 mEq/L — ABNORMAL LOW (ref 3.7–5.3)
SODIUM: 143 meq/L (ref 137–147)

## 2013-12-28 LAB — HEMOGLOBIN A1C
Hgb A1c MFr Bld: 8.1 % — ABNORMAL HIGH (ref <5.7)
Mean Plasma Glucose: 186 mg/dL — ABNORMAL HIGH (ref <117)

## 2013-12-28 MED ORDER — CIPROFLOXACIN HCL 500 MG PO TABS
500.0000 mg | ORAL_TABLET | Freq: Two times a day (BID) | ORAL | Status: DC
  Administered 2013-12-28 – 2014-01-01 (×9): 500 mg via ORAL
  Filled 2013-12-28 (×11): qty 1

## 2013-12-28 MED ORDER — INSULIN GLARGINE 100 UNIT/ML ~~LOC~~ SOLN
14.0000 [IU] | Freq: Every day | SUBCUTANEOUS | Status: DC
  Administered 2013-12-29 – 2013-12-31 (×3): 14 [IU] via SUBCUTANEOUS
  Filled 2013-12-28 (×4): qty 0.14

## 2013-12-28 MED ORDER — GLUCERNA SHAKE PO LIQD
237.0000 mL | Freq: Three times a day (TID) | ORAL | Status: DC
  Administered 2013-12-28 – 2014-01-01 (×13): 237 mL via ORAL

## 2013-12-28 NOTE — Progress Notes (Signed)
INITIAL NUTRITION ASSESSMENT  DOCUMENTATION CODES Per approved criteria  -Obesity Unspecified   INTERVENTION:  Glucerna Shake PO TID, each supplement provides 220 kcal and 10 grams of protein  NUTRITION DIAGNOSIS: Inadequate oral intake related to poor appetite as evidenced by 5-10% meal completion.   Goal: Intake to meet >90% of estimated nutrition needs.  Monitor:  PO intake, labs, weight trend.  Reason for Assessment: Low [place]  73 y.o. female  Admitting Dx: Septic shock  ASSESSMENT: 73 Y O [name] from a nursing home was admitted on 1/7 with sepsis of a urinary source.  Patient reports poor intake related to poor appetite. She has been drinking liquids very well, but eating very poorly. Consuming 5-10% of meals. Patient seemed somewhat confused during RD visit. She agreed to try Glucerna vanilla supplement. RD provided a Glucerna supplement and patient drank the entire supplement in < 1 minute.  Nutrition focused physical exam completed.  No muscle or subcutaneous fat depletion noticed.  Height: Ht Readings from Last 1 Encounters:  12/25/13 5\' 2"  (1.575 m)    Weight: Wt Readings from Last 1 Encounters:  12/28/13 203 lb 11.3 oz (92.4 kg)    Ideal Body Weight: 50 kg  % Ideal Body Weight: 185%  Wt Readings from Last 10 Encounters:  12/28/13 203 lb 11.3 oz (92.4 kg)  06/25/13 202 lb 6.4 oz (91.808 kg)  04/04/13 200 lb (90.719 kg)  03/12/13 201 lb (91.173 kg)  04/02/12 194 lb 14.2 oz (88.4 kg)  03/09/12 185 lb (83.915 kg)  02/21/12 178 lb (80.74 kg)  01/31/12 175 lb 3.2 oz (79.47 kg)  11/15/11 180 lb (81.647 kg)  10/27/11 177 lb (80.287 kg)    Usual Body Weight: 200 lb  % Usual Body Weight: 102%  BMI:  Body mass index is 37.25 kg/(m^2). class 2 obesity  Estimated Nutritional Needs: Kcal: 1500-1700  Protein: 80-90 gm Fluid: 1.5-1.7 L  Skin: no wounds  Diet Order: Carb Control  EDUCATION NEEDS: -Education not appropriate at this  time   Intake/Output Summary (Last 24 hours) at 12/28/13 1004 Last data filed at 12/28/13 0935  Gross per 24 hour  Intake    600 ml  Output      0 ml  Net    600 ml    Last BM: 1/7   Labs:   Recent Labs Lab 12/25/13 1424 12/26/13 0500 12/28/13 0651  NA 135* 144 143  K 4.3 3.6* 3.5*  CL 93* 106 104  CO2 28 29 30   BUN 14 8 3*  CREATININE 0.83 0.52 0.46*  CALCIUM 8.3* 7.9* 8.3*  GLUCOSE 297* 274* 209*    CBG (last 3)   Recent Labs  12/27/13 2013 12/27/13 2140 12/28/13 0752  GLUCAP 207* 223* 191*    Scheduled Meds: . ALPRAZolam  1 mg Oral QID  . ceFEPime (MAXIPIME) IV  1 g Intravenous Q8H  . divalproex  250 mg Oral BID  . gabapentin  900 mg Oral TID  . heparin  5,000 Units Subcutaneous Q8H  . insulin aspart  0-15 Units Subcutaneous TID WC  . insulin aspart  0-5 Units Subcutaneous QHS  . insulin glargine  10 Units Subcutaneous Daily  . ipratropium-albuterol  3 mL Nebulization Q6H  . metoprolol tartrate  25 mg Oral BID  . QUEtiapine  200 mg Oral QHS  . QUEtiapine  75 mg Oral Daily  . traZODone  100 mg Oral QHS  . venlafaxine XR  150 mg Oral BH-q7a    Continuous  Infusions:   Past Medical History  Diagnosis Date  . Anxiety   . Arthritis   . COPD (chronic obstructive pulmonary disease)   . GERD (gastroesophageal reflux disease)   . Hyperlipidemia   . Osteoporosis   . DVT (deep venous thrombosis)   . PE (pulmonary thromboembolism)   . Hypertension      PT DENIES....ON NO  MEDS  . Diabetes mellitus   . Cancer 1969    cervical  . Pneumonia     h/o  . Bronchitis     h/o  . Blood transfusion   . Anemia   . Bipolar affect, depressed     Past Surgical History  Procedure Laterality Date  . Vena cava filter placement    . 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 Eye surgery      CATARACTS  . Achilles tendon lengthening  11/18/11    and repair w/posterior tibial tendon lengthening; right  foot  . Cholecystectomy    . Abdominal hysterectomy   1969    "womb taken out for cervical cancer"  . Cataract extraction, bilateral  ~ 2008  . Tonsillectomy      "as a child"  . Achilles tendon surgery  11/18/2011    Procedure: ACHILLES TENDON REPAIR;  Surgeon: Wylene Simmer, MD;  Location: [place];  Service: Orthopedics;  Laterality: Right;  Right Posterior Tibial Tendon Lenghtening and Tendon Achilles Lenghtening     Molli Barrows, RD, LDN, [place] Pager [phone] After Hours Pager [phone]

## 2013-12-28 NOTE — Evaluation (Signed)
Physical Therapy Evaluation Patient Details Name: [name] MRN: [id number] DOB: 1941/09/14 Today's Date: 12/28/2013 Time: 7425-9563 PT Time Calculation (min): 26 min  PT Assessment / Plan / Recommendation History of Present Illness  73 Y O F from a nursing home was admitted on 1/7 with sepsis of a urinary source.  Clinical Impression  Pt presents with decreased strength, mobility and activity tolerance.  Pt will benefit from acute PT services to decrease burden of care for d/c to SNF.    PT Assessment  Patient needs continued PT services    Follow Up Recommendations  SNF    Does the patient have the potential to tolerate intense rehabilitation      Barriers to Discharge        Equipment Recommendations  None recommended by PT    Recommendations for Other Services     Frequency Min 2X/week    Precautions / Restrictions Precautions Precautions: Fall Restrictions Weight Bearing Restrictions: No   Pertinent Vitals/Pain No c/o pain.  Pt on 2L O2 [place] throughout session      Mobility  Bed Mobility Overal bed mobility: Needs Assistance Bed Mobility: Supine to Sit;Sit to Supine;Rolling Rolling: Mod assist Supine to sit: Mod assist Sit to supine: Mod assist General bed mobility comments: assist for moving LEs in and out of  bed and for lifting trunk.  rolling both ways for positioning in bed with mod A, cues and handrails Transfers Overall transfer level: Needs assistance Equipment used: Rolling walker (2 wheeled) Transfers: Sit to/from Stand Sit to Stand: Mod assist General transfer comment: pt performed sit to stand x 3 with RW and mod A, cues for hand placement.  Pt able to stand x 3 minutes each time for hygiene after episode of urinary incontinence    Exercises     PT Diagnosis: Difficulty walking;Generalized weakness  PT Problem List: Decreased balance;Decreased activity tolerance;Decreased strength;Decreased mobility;Decreased safety awareness;Decreased  knowledge of use of DME PT Treatment Interventions: DME instruction;Therapeutic exercise;Gait training;Balance training;Neuromuscular re-education;Modalities;Functional mobility training;Therapeutic activities;Patient/family education     PT Goals(Current goals can be found in the care plan section) Acute Rehab PT Goals Patient Stated Goal: get better PT Goal Formulation: With patient Time For Goal Achievement: 01/04/14 Potential to Achieve Goals: Good  Visit Information  Last PT Received On: 12/28/13 Assistance Needed: +1 (needs +2 for gait) History of Present Illness: 18 Y O F from a nursing home was admitted on 1/7 with sepsis of a urinary source.       Prior [place] expects to be discharged to:: Skilled nursing facility Prior Function Level of Independence: Needs assistance Communication Communication: No difficulties    Cognition  Cognition Arousal/Alertness: Awake/alert Behavior During Therapy: WFL for tasks assessed/performed Overall Cognitive Status: Within Functional Limits for tasks assessed    Extremity/Trunk Assessment Upper Extremity Assessment Upper Extremity Assessment: Generalized weakness Lower Extremity Assessment Lower Extremity Assessment: Generalized weakness Cervical / Trunk Assessment Cervical / Trunk Assessment: Kyphotic   Balance Balance Overall balance assessment: Needs assistance Dynamic Sitting - Comments: able to sit edge of bed with supervision Standing balance comment: able to stand x 3 minutes with min guard with B UE support  End of Session PT - End of Session Equipment Utilized During Treatment: Gait belt;Oxygen Activity Tolerance: Patient tolerated treatment well Patient left: in bed;with bed alarm set;with call bell/phone within reach;with nursing/sitter in room Nurse Communication: Mobility status  GP     Neo Yepiz 12/28/2013, 1:10 PM

## 2013-12-28 NOTE — Progress Notes (Signed)
[facility] PROGRESS NOTE  Kristen ARMBRECHT UJW:[id number] DOB: 1941-06-01 DOA: 12/25/2013 PCP: No primary provider on file.  Assessment/Plan: COPD not in exacerbation  -Continue home meds- Duonebs- Q6H   Septic shock- Focus- Urinary- e coli - d/c IVF - Levophed off -Continue Cefipime day #4-- change to cipro - Urine cultures pending  - Blood cultures pending  - Influenza panel -ve   Neurogenic bladder (daughter concerned about urine retention)  - Consider bladder ultrasound after foley removed  -urology input if retaining    Leukocytosis- likely due to sepsis- from UTI. Resolved.  - Anemia- appears chronic and at baseline.  - Follow CBCs      DM  - Lantus-- increase -SSI TID with meald  Acute encephalopthy- Likely due to Sepsis Vs poly pharmacy (Patient is on a lot of psychoactive medications). Baseline unknown, appears improved from previous.  - Restarted home psych meds. - Continue treating infection.   HTN/tachycardia -resume metoprolol    Code Status: full Family Communication:  Disposition Plan: SNF- PT eval and back to SNF   Consultants:    Procedures:    Antibiotics:  Cefepime 1/6  vanc 1/6-1/8  HPI/Subjective: Wants to get up Says she was walking at her SNF  Objective: Filed Vitals:   12/28/13 0636  BP: 136/88  Pulse: 105  Temp: 97.6 F (36.4 C)  Resp: 22    Intake/Output Summary (Last 24 hours) at 12/28/13 0813 Last data filed at 12/27/13 1900  Gross per 24 hour  Intake    720 ml  Output      0 ml  Net    720 ml   Filed Weights   12/26/13 0500 12/27/13 0630 12/28/13 0500  Weight: 93.7 kg (206 lb 9.1 oz) 95.5 kg (210 lb 8.6 oz) 92.4 kg (203 lb 11.3 oz)    Exam:  GENERAL- Awake, alert, in no acute distress.  CARDIAC- RRR, no murmurs, rubs or gallops.  RESP- decreased b/l bilaterally- No rales or wheezes.  ABDOMEN- Soft,non tender,no palpable masses or organomegaly, bowel sounds reduced.  NEURO- No obvious facial  assymmetry  EXTREMITIES- pulse 2+, symmetric. +1 upper and lower extr edema.  SKIN- Warm, dry, No rash or lesion.   Data Reviewed: Basic Metabolic Panel:  Recent Labs Lab 12/25/13 1424 12/26/13 0500 12/28/13 0651  NA 135* 144 143  K 4.3 3.6* 3.5*  CL 93* 106 104  CO2 28 29 30   GLUCOSE 297* 274* 209*  BUN 14 8 3*  CREATININE 0.83 0.52 0.46*  CALCIUM 8.3* 7.9* 8.3*   Liver Function Tests:  Recent Labs Lab 12/25/13 1424  AST 12  ALT 10  ALKPHOS 58  BILITOT 0.4  PROT 7.1  ALBUMIN 3.0*   No results found for this basename: LIPASE, AMYLASE,  in the last 168 hours No results found for this basename: AMMONIA,  in the last 168 hours CBC:  Recent Labs Lab 12/25/13 1424 12/26/13 0500 12/28/13 0651  WBC 13.6* 9.3 4.7  NEUTROABS 10.3*  --   --   HGB 10.6* 9.7* 9.9*  HCT 33.6* 31.8* 32.9*  MCV 90.6 92.2 93.2  PLT 151 139* 172   Cardiac Enzymes:  Recent Labs Lab 12/25/13 2100 12/27/13 0050 12/27/13 0552 12/27/13 1250  TROPONINI <0.30 <0.30 <0.30 <0.30   BNP (last 3 results) No results found for this basename: PROBNP,  in the last 8760 hours CBG:  Recent Labs Lab 12/27/13 1142 12/27/13 1732 12/27/13 2013 12/27/13 2140 12/28/13 0752  GLUCAP 213* 186* 207* 223*  191*    Recent Results (from the past 240 hour(s))  CULTURE, BLOOD (ROUTINE X 2)     Status: None   Collection Time    12/25/13  2:45 PM      Result Value Range Status   Specimen Description BLOOD HAND RIGHT   Final   Special Requests     Final   Value: BOTTLES DRAWN AEROBIC AND ANAEROBIC 10CCBLUE 9CCRED   Culture  Setup Time     Final   Value: 12/25/2013 20:23     Performed at [facility]   Culture     Final   Value:        BLOOD CULTURE RECEIVED NO GROWTH TO DATE CULTURE WILL BE HELD FOR 5 DAYS BEFORE ISSUING A FINAL NEGATIVE REPORT     Performed at [facility]   Report Status PENDING   Incomplete  CULTURE, BLOOD (ROUTINE X 2)     Status: None   Collection Time     12/25/13  2:52 PM      Result Value Range Status   Specimen Description BLOOD HAND LEFT   Final   Special Requests BOTTLES DRAWN AEROBIC ONLY 10CC   Final   Culture  Setup Time     Final   Value: 12/25/2013 20:23     Performed at [facility]   Culture     Final   Value:        BLOOD CULTURE RECEIVED NO GROWTH TO DATE CULTURE WILL BE HELD FOR 5 DAYS BEFORE ISSUING A FINAL NEGATIVE REPORT     Performed at [facility]   Report Status PENDING   Incomplete  URINE CULTURE     Status: None   Collection Time    12/25/13  3:19 PM      Result Value Range Status   Specimen Description URINE, CATHETERIZED   Final   Special Requests NONE   Final   Culture  Setup Time     Final   Value: 12/25/2013 20:28     Performed at [facility] Count     Final   Value: >=100,000 COLONIES/ML     Performed at [facility]   Culture     Final   Value: ESCHERICHIA COLI     Performed at [facility]   Report Status 12/28/2013 FINAL   Final   Organism ID, Bacteria ESCHERICHIA COLI   Final  MRSA PCR SCREENING     Status: Abnormal   Collection Time    12/25/13  7:38 PM      Result Value Range Status   MRSA by PCR POSITIVE (*) NEGATIVE Final   Comment:            The GeneXpert MRSA Assay (FDA     approved for NASAL specimens     only), is one component of a     comprehensive MRSA colonization     surveillance program. It is not     intended to diagnose MRSA     infection nor to guide or     monitor treatment for     MRSA infections.     RESULT CALLED TO, READ BACK BY AND VERIFIED WITH:     Graylin Shiver RN [id number] 12/25/13 A BROWNING  CLOSTRIDIUM DIFFICILE BY PCR     Status: None   Collection Time    12/26/13  2:34 PM      Result Value Range Status  C difficile by pcr NEGATIVE  NEGATIVE Final     Studies: No results found.  Scheduled Meds: . ALPRAZolam  1 mg Oral QID  . ceFEPime (MAXIPIME) IV  1 g Intravenous Q8H  . divalproex  250 mg Oral BID  .  gabapentin  900 mg Oral TID  . heparin  5,000 Units Subcutaneous Q8H  . insulin aspart  0-15 Units Subcutaneous TID WC  . insulin aspart  0-5 Units Subcutaneous QHS  . insulin glargine  10 Units Subcutaneous Daily  . ipratropium-albuterol  3 mL Nebulization Q6H  . metoprolol tartrate  25 mg Oral BID  . QUEtiapine  200 mg Oral QHS  . QUEtiapine  75 mg Oral Daily  . traZODone  100 mg Oral QHS  . venlafaxine XR  150 mg Oral BH-q7a   Continuous Infusions:    Principal Problem:   Septic shock Active Problems:   DIABETES MELLITUS, TYPE II   UTI   Acute encephalopathy    Time spent: 35 min    [name]  [facility] Pager [phone] If 7PM-7AM, please contact night-coverage at [email or web address], password [facility] 12/28/2013, 8:13 AM  LOS: 3 days

## 2013-12-28 NOTE — Clinical Social Work Note (Signed)
CSW introduced self to daughter via phone as patient transferred to 56W. CSW will follow.  Liz [place], [place], [place], [phone]

## 2013-12-29 LAB — GLUCOSE, CAPILLARY
GLUCOSE-CAPILLARY: 271 mg/dL — AB (ref 70–99)
Glucose-Capillary: 173 mg/dL — ABNORMAL HIGH (ref 70–99)
Glucose-Capillary: 202 mg/dL — ABNORMAL HIGH (ref 70–99)
Glucose-Capillary: 146 mg/dL — ABNORMAL HIGH (ref 70–99)
Glucose-Capillary: 267 mg/dL — ABNORMAL HIGH (ref 70–99)

## 2013-12-29 MED ORDER — IPRATROPIUM BROMIDE 0.02 % IN SOLN
0.5000 mg | Freq: Four times a day (QID) | RESPIRATORY_TRACT | Status: DC
  Filled 2013-12-29: qty 2.5

## 2013-12-29 MED ORDER — ALBUTEROL SULFATE (2.5 MG/3ML) 0.083% IN NEBU
2.5000 mg | INHALATION_SOLUTION | RESPIRATORY_TRACT | Status: DC | PRN
  Administered 2013-12-31 – 2014-01-01 (×2): 2.5 mg via RESPIRATORY_TRACT
  Filled 2013-12-29 (×2): qty 3

## 2013-12-29 MED ORDER — TRAZODONE 25 MG HALF TABLET
25.0000 mg | ORAL_TABLET | Freq: Once | ORAL | Status: AC
  Administered 2013-12-29: 18:00:00 25 mg via ORAL
  Filled 2013-12-29: qty 1

## 2013-12-29 MED ORDER — POTASSIUM CHLORIDE CRYS ER 20 MEQ PO TBCR
40.0000 meq | EXTENDED_RELEASE_TABLET | Freq: Once | ORAL | Status: AC
  Administered 2013-12-29: 40 meq via ORAL
  Filled 2013-12-29: qty 2

## 2013-12-29 MED ORDER — IPRATROPIUM BROMIDE 0.02 % IN SOLN
0.5000 mg | RESPIRATORY_TRACT | Status: DC
  Administered 2013-12-29 (×2): 0.5 mg via RESPIRATORY_TRACT
  Filled 2013-12-29 (×2): qty 2.5

## 2013-12-29 MED ORDER — LEVALBUTEROL HCL 1.25 MG/0.5ML IN NEBU
1.2500 mg | INHALATION_SOLUTION | Freq: Four times a day (QID) | RESPIRATORY_TRACT | Status: DC
  Administered 2013-12-29 (×2): 1.25 mg via RESPIRATORY_TRACT
  Filled 2013-12-29 (×6): qty 0.5

## 2013-12-29 MED ORDER — IPRATROPIUM BROMIDE 0.02 % IN SOLN
0.5000 mg | Freq: Four times a day (QID) | RESPIRATORY_TRACT | Status: DC
  Administered 2013-12-29: 13:00:00 0.5 mg via RESPIRATORY_TRACT
  Filled 2013-12-29: qty 2.5

## 2013-12-29 MED ORDER — FUROSEMIDE 10 MG/ML IJ SOLN
20.0000 mg | Freq: Once | INTRAMUSCULAR | Status: AC
  Administered 2013-12-29: 16:00:00 20 mg via INTRAVENOUS
  Filled 2013-12-29: qty 2

## 2013-12-29 NOTE — Progress Notes (Signed)
[facility] PROGRESS NOTE  RIJA SHAMBAUGH HQI:[id number] DOB: 1941-05-28 DOA: 12/25/2013 PCP: No primary provider on file.  Assessment/Plan: COPD not in exacerbation  -Continue home meds- nebs- Q6H  Add PRN albuterol -wheezing some- refusing steroids -try lasix dose x 1  Septic shock- Focus- Urinary- e coli - d/c IVF - Levophed off -Continue Cefipime day #4-- change to cipro for 3 more days - Urine cultures ecoli  - Blood cultures NGTD  - Influenza panel -ve   Neurogenic bladder (daughter concerned about urine retention)  -patient voiding   Leukocytosis- likely due to sepsis- from UTI. Resolved.  - Anemia- appears chronic and at baseline.  - Follow CBCs    DM  - Lantus-- increase -SSI TID with meals  Acute encephalopthy- Likely due to Sepsis Vs poly pharmacy (Patient is on a lot of psychoactive medications). Baseline unknown, appears improved from previous.  - Restarted home psych meds. - Continue treating infection.   HTN/tachycardia -resume metoprolol    Code Status: full Family Communication:  Disposition Plan: SNF- PT eval and back to SNF   Consultants:    Procedures:    Antibiotics:  Cefepime 1/6  vanc 1/6-1/8  HPI/Subjective: C/o not getting breathing treatments fast enough  Objective: Filed Vitals:   12/29/13 1028  BP: 138/117  Pulse:   Temp:   Resp:     Intake/Output Summary (Last 24 hours) at 12/29/13 1056 Last data filed at 12/29/13 0600  Gross per 24 hour  Intake    840 ml  Output      0 ml  Net    840 ml   Filed Weights   12/27/13 0630 12/28/13 0500 12/29/13 0702  Weight: 95.5 kg (210 lb 8.6 oz) 92.4 kg (203 lb 11.3 oz) 89.6 kg (197 lb 8.5 oz)    Exam:  GENERAL- Awake, alert, in no acute distress.  CARDIAC- RRR, no murmurs, rubs or gallops.  RESP- decreased b/l bilaterally- No rales or wheezes.  ABDOMEN- Soft,non tender,no palpable masses or organomegaly, bowel sounds reduced.  EXTREMITIES- pulse 2+, symmetric.  +1 upper and lower extr edema.  SKIN- Warm, dry, No rash or lesion.   Data Reviewed: Basic Metabolic Panel:  Recent Labs Lab 12/25/13 1424 12/26/13 0500 12/28/13 0651  NA 135* 144 143  K 4.3 3.6* 3.5*  CL 93* 106 104  CO2 28 29 30   GLUCOSE 297* 274* 209*  BUN 14 8 3*  CREATININE 0.83 0.52 0.46*  CALCIUM 8.3* 7.9* 8.3*   Liver Function Tests:  Recent Labs Lab 12/25/13 1424  AST 12  ALT 10  ALKPHOS 58  BILITOT 0.4  PROT 7.1  ALBUMIN 3.0*   No results found for this basename: LIPASE, AMYLASE,  in the last 168 hours No results found for this basename: AMMONIA,  in the last 168 hours CBC:  Recent Labs Lab 12/25/13 1424 12/26/13 0500 12/28/13 0651  WBC 13.6* 9.3 4.7  NEUTROABS 10.3*  --   --   HGB 10.6* 9.7* 9.9*  HCT 33.6* 31.8* 32.9*  MCV 90.6 92.2 93.2  PLT 151 139* 172   Cardiac Enzymes:  Recent Labs Lab 12/25/13 2100 12/27/13 0050 12/27/13 0552 12/27/13 1250  TROPONINI <0.30 <0.30 <0.30 <0.30   BNP (last 3 results) No results found for this basename: PROBNP,  in the last 8760 hours CBG:  Recent Labs Lab 12/28/13 0752 12/28/13 1203 12/28/13 1708 12/28/13 2137 12/29/13 0902  GLUCAP 191* 217* 130* 161* 173*    Recent Results (from the past 240  hour(s))  CULTURE, BLOOD (ROUTINE X 2)     Status: None   Collection Time    12/25/13  2:45 PM      Result Value Range Status   Specimen Description BLOOD HAND RIGHT   Final   Special Requests     Final   Value: BOTTLES DRAWN AEROBIC AND ANAEROBIC 10CCBLUE 9CCRED   Culture  Setup Time     Final   Value: 12/25/2013 20:23     Performed at [facility]   Culture     Final   Value:        BLOOD CULTURE RECEIVED NO GROWTH TO DATE CULTURE WILL BE HELD FOR 5 DAYS BEFORE ISSUING A FINAL NEGATIVE REPORT     Performed at [facility]   Report Status PENDING   Incomplete  CULTURE, BLOOD (ROUTINE X 2)     Status: None   Collection Time    12/25/13  2:52 PM      Result Value Range Status    Specimen Description BLOOD HAND LEFT   Final   Special Requests BOTTLES DRAWN AEROBIC ONLY 10CC   Final   Culture  Setup Time     Final   Value: 12/25/2013 20:23     Performed at [facility]   Culture     Final   Value:        BLOOD CULTURE RECEIVED NO GROWTH TO DATE CULTURE WILL BE HELD FOR 5 DAYS BEFORE ISSUING A FINAL NEGATIVE REPORT     Performed at [facility]   Report Status PENDING   Incomplete  URINE CULTURE     Status: None   Collection Time    12/25/13  3:19 PM      Result Value Range Status   Specimen Description URINE, CATHETERIZED   Final   Special Requests NONE   Final   Culture  Setup Time     Final   Value: 12/25/2013 20:28     Performed at [facility] Count     Final   Value: >=100,000 COLONIES/ML     Performed at [facility]   Culture     Final   Value: ESCHERICHIA COLI     Performed at [facility]   Report Status 12/28/2013 FINAL   Final   Organism ID, Bacteria ESCHERICHIA COLI   Final  MRSA PCR SCREENING     Status: Abnormal   Collection Time    12/25/13  7:38 PM      Result Value Range Status   MRSA by PCR POSITIVE (*) NEGATIVE Final   Comment:            The GeneXpert MRSA Assay (FDA     approved for NASAL specimens     only), is one component of a     comprehensive MRSA colonization     surveillance program. It is not     intended to diagnose MRSA     infection nor to guide or     monitor treatment for     MRSA infections.     RESULT CALLED TO, READ BACK BY AND VERIFIED WITH:     Graylin Shiver RN [id number] 12/25/13 A BROWNING  CLOSTRIDIUM DIFFICILE BY PCR     Status: None   Collection Time    12/26/13  2:34 PM      Result Value Range Status   C difficile by pcr NEGATIVE  NEGATIVE Final  Studies: No results found.  Scheduled Meds: . ALPRAZolam  1 mg Oral QID  . ciprofloxacin  500 mg Oral BID  . divalproex  250 mg Oral BID  . feeding supplement (GLUCERNA SHAKE)  237 mL Oral TID BM  .  gabapentin  900 mg Oral TID  . heparin  5,000 Units Subcutaneous Q8H  . insulin aspart  0-15 Units Subcutaneous TID WC  . insulin aspart  0-5 Units Subcutaneous QHS  . insulin glargine  14 Units Subcutaneous Daily  . ipratropium-albuterol  3 mL Nebulization Q6H  . metoprolol tartrate  25 mg Oral BID  . QUEtiapine  200 mg Oral QHS  . QUEtiapine  75 mg Oral Daily  . traZODone  100 mg Oral QHS  . venlafaxine XR  150 mg Oral BH-q7a   Continuous Infusions:    Principal Problem:   Septic shock Active Problems:   DIABETES MELLITUS, TYPE II   UTI   Acute encephalopathy    Time spent: 35 min    Satomi Buda  [facility] Pager [phone] If 7PM-7AM, please contact night-coverage at [email or web address], password [facility] 12/29/2013, 10:56 AM  LOS: 4 days

## 2013-12-29 NOTE — Progress Notes (Signed)
Patient called her daughter. Daughter called RN to state that her mother would like to go to bed. RN went into patient's room and explained that she would be receiving a bath and then she would be put in the bed. Will continue to monitor

## 2013-12-30 ENCOUNTER — Inpatient Hospital Stay (HOSPITAL_COMMUNITY): Payer: Medicare Other

## 2013-12-30 DIAGNOSIS — R000 Tachycardia, unspecified: Secondary | ICD-10-CM

## 2013-12-30 DIAGNOSIS — I10 Essential (primary) hypertension: Secondary | ICD-10-CM

## 2013-12-30 LAB — GLUCOSE, CAPILLARY
GLUCOSE-CAPILLARY: 264 mg/dL — AB (ref 70–99)
Glucose-Capillary: 211 mg/dL — ABNORMAL HIGH (ref 70–99)
Glucose-Capillary: 212 mg/dL — ABNORMAL HIGH (ref 70–99)
Glucose-Capillary: 264 mg/dL — ABNORMAL HIGH (ref 70–99)

## 2013-12-30 LAB — PRO B NATRIURETIC PEPTIDE: Pro B Natriuretic peptide (BNP): 1309 pg/mL — ABNORMAL HIGH (ref 0–125)

## 2013-12-30 MED ORDER — LEVALBUTEROL HCL 1.25 MG/0.5ML IN NEBU
1.2500 mg | INHALATION_SOLUTION | Freq: Four times a day (QID) | RESPIRATORY_TRACT | Status: DC
  Administered 2013-12-30 – 2014-01-01 (×11): 1.25 mg via RESPIRATORY_TRACT
  Filled 2013-12-30 (×13): qty 0.5

## 2013-12-30 MED ORDER — IPRATROPIUM BROMIDE 0.02 % IN SOLN
0.5000 mg | Freq: Four times a day (QID) | RESPIRATORY_TRACT | Status: DC
  Administered 2013-12-30 – 2014-01-01 (×11): 0.5 mg via RESPIRATORY_TRACT
  Filled 2013-12-30 (×11): qty 2.5

## 2013-12-30 MED ORDER — NYSTATIN 100000 UNIT/ML MT SUSP
5.0000 mL | Freq: Four times a day (QID) | OROMUCOSAL | Status: DC
  Administered 2013-12-30 – 2014-01-01 (×10): 500000 [IU] via ORAL
  Filled 2013-12-30 (×12): qty 5

## 2013-12-30 NOTE — Progress Notes (Addendum)
[facility] PROGRESS NOTE  LARIYAH GLASPY AOZ:[id number] DOB: November 29, 1941 DOA: 12/25/2013 PCP: No primary provider on file.  Assessment/Plan: COPD not in exacerbation  -Continue home meds- nebs- Q6H  Add PRN albuterol -try lasix dose x 1- wheezing better -wean off O2- not weaning may need CTA to r/o PE -check x ray  Septic shock- Focus- Urinary- e coli - d/c IVF - Levophed off -Cefepime day #4-- change to cipro for 3 more days - Urine cultures ecoli  - Blood cultures NGTD  - Influenza panel -ve   Neurogenic bladder (daughter concerned about urine retention)  -patient voiding   Leukocytosis- likely due to sepsis- from UTI. Resolved.  - Anemia- appears chronic and at baseline.  - Follow CBCs    DM  - Lantus-- increase -SSI TID with meals  Acute encephalopthy- Likely due to Sepsis Vs poly pharmacy (Patient is on a lot of psychoactive medications). Baseline unknown, appears improved from previous.  - Restarted home psych meds. - Continue treating infection.   HTN/tachycardia -resume metoprolol  Anxiety -on xanax   Code Status: full Family Communication:  Disposition Plan: SNF- PT eval and back to SNF   Consultants:    Procedures:    Antibiotics:  Cefepime 1/6  vanc 1/6-1/8  HPI/Subjective: Tearful Speaking in sentences- hard to understand has mouth movement associated with thorazine  Objective: Filed Vitals:   12/30/13 1335  BP: 148/121  Pulse: 120  Temp: 97.6 F (36.4 C)  Resp: 22    Intake/Output Summary (Last 24 hours) at 12/30/13 1345 Last data filed at 12/30/13 1331  Gross per 24 hour  Intake    960 ml  Output      0 ml  Net    960 ml   Filed Weights   12/28/13 0500 12/29/13 0702 12/30/13 0615  Weight: 92.4 kg (203 lb 11.3 oz) 89.6 kg (197 lb 8.5 oz) 89.7 kg (197 lb 12 oz)    Exam:  GENERAL- Awake, alert, in no acute distress.  CARDIAC- RRR, no murmurs, rubs or gallops.  RESP- decreased b/l bilaterally- No rales or  wheezes.  ABDOMEN- Soft,non tender,no palpable masses or organomegaly, bowel sounds reduced.  EXTREMITIES- pulse 2+, symmetric. +1 upper and lower extr edema.  SKIN- Warm, dry, No rash or lesion.   Data Reviewed: Basic Metabolic Panel:  Recent Labs Lab 12/25/13 1424 12/26/13 0500 12/28/13 0651  NA 135* 144 143  K 4.3 3.6* 3.5*  CL 93* 106 104  CO2 28 29 30   GLUCOSE 297* 274* 209*  BUN 14 8 3*  CREATININE 0.83 0.52 0.46*  CALCIUM 8.3* 7.9* 8.3*   Liver Function Tests:  Recent Labs Lab 12/25/13 1424  AST 12  ALT 10  ALKPHOS 58  BILITOT 0.4  PROT 7.1  ALBUMIN 3.0*   No results found for this basename: LIPASE, AMYLASE,  in the last 168 hours No results found for this basename: AMMONIA,  in the last 168 hours CBC:  Recent Labs Lab 12/25/13 1424 12/26/13 0500 12/28/13 0651  WBC 13.6* 9.3 4.7  NEUTROABS 10.3*  --   --   HGB 10.6* 9.7* 9.9*  HCT 33.6* 31.8* 32.9*  MCV 90.6 92.2 93.2  PLT 151 139* 172   Cardiac Enzymes:  Recent Labs Lab 12/25/13 2100 12/27/13 0050 12/27/13 0552 12/27/13 1250  TROPONINI <0.30 <0.30 <0.30 <0.30   BNP (last 3 results) No results found for this basename: PROBNP,  in the last 8760 hours CBG:  Recent Labs Lab 12/29/13 1435 12/29/13  1731 12/29/13 2119 12/30/13 0755 12/30/13 1156  GLUCAP 271* 146* 267* 211* 212*    Recent Results (from the past 240 hour(s))  CULTURE, BLOOD (ROUTINE X 2)     Status: None   Collection Time    12/25/13  2:45 PM      Result Value Range Status   Specimen Description BLOOD HAND RIGHT   Final   Special Requests     Final   Value: BOTTLES DRAWN AEROBIC AND ANAEROBIC 10CCBLUE 9CCRED   Culture  Setup Time     Final   Value: 12/25/2013 20:23     Performed at [facility]   Culture     Final   Value:        BLOOD CULTURE RECEIVED NO GROWTH TO DATE CULTURE WILL BE HELD FOR 5 DAYS BEFORE ISSUING A FINAL NEGATIVE REPORT     Performed at [facility]   Report Status PENDING    Incomplete  CULTURE, BLOOD (ROUTINE X 2)     Status: None   Collection Time    12/25/13  2:52 PM      Result Value Range Status   Specimen Description BLOOD HAND LEFT   Final   Special Requests BOTTLES DRAWN AEROBIC ONLY 10CC   Final   Culture  Setup Time     Final   Value: 12/25/2013 20:23     Performed at [facility]   Culture     Final   Value:        BLOOD CULTURE RECEIVED NO GROWTH TO DATE CULTURE WILL BE HELD FOR 5 DAYS BEFORE ISSUING A FINAL NEGATIVE REPORT     Performed at [facility]   Report Status PENDING   Incomplete  URINE CULTURE     Status: None   Collection Time    12/25/13  3:19 PM      Result Value Range Status   Specimen Description URINE, CATHETERIZED   Final   Special Requests NONE   Final   Culture  Setup Time     Final   Value: 12/25/2013 20:28     Performed at [facility] Count     Final   Value: >=100,000 COLONIES/ML     Performed at [facility]   Culture     Final   Value: ESCHERICHIA COLI     Performed at [facility]   Report Status 12/28/2013 FINAL   Final   Organism ID, Bacteria ESCHERICHIA COLI   Final  MRSA PCR SCREENING     Status: Abnormal   Collection Time    12/25/13  7:38 PM      Result Value Range Status   MRSA by PCR POSITIVE (*) NEGATIVE Final   Comment:            The GeneXpert MRSA Assay (FDA     approved for NASAL specimens     only), is one component of a     comprehensive MRSA colonization     surveillance program. It is not     intended to diagnose MRSA     infection nor to guide or     monitor treatment for     MRSA infections.     RESULT CALLED TO, READ BACK BY AND VERIFIED WITHGraylin Shiver RN 0981 12/25/13 A BROWNING  CLOSTRIDIUM DIFFICILE BY PCR     Status: None   Collection Time    12/26/13  2:34 PM      Result Value Range Status   C difficile by pcr NEGATIVE  NEGATIVE Final     Studies: No results found.  Scheduled Meds: . ALPRAZolam  1 mg Oral QID   . ciprofloxacin  500 mg Oral BID  . divalproex  250 mg Oral BID  . feeding supplement (GLUCERNA SHAKE)  237 mL Oral TID BM  . gabapentin  900 mg Oral TID  . heparin  5,000 Units Subcutaneous Q8H  . insulin aspart  0-15 Units Subcutaneous TID WC  . insulin aspart  0-5 Units Subcutaneous QHS  . insulin glargine  14 Units Subcutaneous Daily  . ipratropium  0.5 mg Nebulization QID  . levalbuterol  1.25 mg Nebulization QID  . metoprolol tartrate  25 mg Oral BID  . nystatin  5 mL Oral QID  . QUEtiapine  200 mg Oral QHS  . QUEtiapine  75 mg Oral Daily  . traZODone  100 mg Oral QHS  . venlafaxine XR  150 mg Oral BH-q7a   Continuous Infusions:    Principal Problem:   Septic shock Active Problems:   DIABETES MELLITUS, TYPE II   UTI   Acute encephalopathy    Time spent: 35 min    Ayvah Caroll  [facility] Pager [phone] If 7PM-7AM, please contact night-coverage at [email or web address], password [facility] 12/30/2013, 1:45 PM  LOS: 5 days

## 2013-12-30 NOTE — Progress Notes (Signed)
RN assessed patient and heard her wheezing. Respiratory has been called to give patient a breathing treatment. Will continue to monitor

## 2013-12-31 ENCOUNTER — Inpatient Hospital Stay (HOSPITAL_COMMUNITY): Payer: Medicare Other

## 2013-12-31 ENCOUNTER — Encounter (HOSPITAL_COMMUNITY): Payer: Self-pay | Admitting: Radiology

## 2013-12-31 DIAGNOSIS — F411 Generalized anxiety disorder: Secondary | ICD-10-CM

## 2013-12-31 LAB — URINALYSIS, ROUTINE W REFLEX MICROSCOPIC
BILIRUBIN URINE: NEGATIVE
Glucose, UA: 500 mg/dL — AB
KETONES UR: NEGATIVE mg/dL
NITRITE: POSITIVE — AB
Protein, ur: >300 mg/dL — AB
Specific Gravity, Urine: 1.044 — ABNORMAL HIGH (ref 1.005–1.030)
UROBILINOGEN UA: 0.2 mg/dL (ref 0.0–1.0)
pH: 8 (ref 5.0–8.0)

## 2013-12-31 LAB — CULTURE, BLOOD (ROUTINE X 2)
CULTURE: NO GROWTH
Culture: NO GROWTH

## 2013-12-31 LAB — URINE MICROSCOPIC-ADD ON

## 2013-12-31 LAB — GLUCOSE, CAPILLARY
GLUCOSE-CAPILLARY: 206 mg/dL — AB (ref 70–99)
GLUCOSE-CAPILLARY: 232 mg/dL — AB (ref 70–99)
GLUCOSE-CAPILLARY: 205 mg/dL — AB (ref 70–99)
Glucose-Capillary: 246 mg/dL — ABNORMAL HIGH (ref 70–99)

## 2013-12-31 LAB — BASIC METABOLIC PANEL
BUN: 8 mg/dL (ref 6–23)
CHLORIDE: 96 meq/L (ref 96–112)
CO2: 32 mEq/L (ref 19–32)
CREATININE: 0.46 mg/dL — AB (ref 0.50–1.10)
Calcium: 9.2 mg/dL (ref 8.4–10.5)
GFR calc non Af Amer: >90 mL/min (ref >90)
Glucose, Bld: 264 mg/dL — ABNORMAL HIGH (ref 70–99)
Potassium: 5.2 mEq/L (ref 3.7–5.3)
Sodium: 140 mEq/L (ref 137–147)

## 2013-12-31 MED ORDER — INSULIN GLARGINE 100 UNIT/ML ~~LOC~~ SOLN
22.0000 [IU] | Freq: Every day | SUBCUTANEOUS | Status: DC
  Administered 2014-01-01: 22 [IU] via SUBCUTANEOUS
  Filled 2013-12-31: qty 0.22

## 2013-12-31 MED ORDER — IOHEXOL 350 MG/ML SOLN
100.0000 mL | Freq: Once | INTRAVENOUS | Status: AC | PRN
  Administered 2013-12-31: 100 mL via INTRAVENOUS

## 2013-12-31 NOTE — Progress Notes (Signed)
[facility] PROGRESS NOTE  [name] QIO:[id number] DOB: Nov 18, 1941 DOA: 12/25/2013 PCP: No primary provider on file.  Assessment/Plan: COPD not in exacerbation  -Continue home meds- nebs- Q6H  Add PRN albuterol -try lasix dose x 1- wheezing better -wean off O2- not weaning will get CTA to r/o PE -chest x ray doe snot show active disease -suspect some is psych as patient keeps saying she is lonely  Septic shock- Focus- Urinary- e coli - d/c IVF - Levophed off -Cefepime day #4-- change to cipro for 3 more days- d/c in AM - Urine cultures ecoli  - Blood cultures NGTD  - Influenza panel -ve   Neurogenic bladder (daughter concerned about urine retention)  -patient voiding   Leukocytosis- likely due to sepsis- from UTI. Resolved.  - Anemia- appears chronic and at baseline.  - Follow CBCs    DM  - Lantus-- increase -SSI TID with meals  Acute encephalopthy- Likely due to Sepsis Vs poly pharmacy (Patient is on a lot of psychoactive medications). Baseline unknown, appears improved from previous.  - Restarted home psych meds. - Continue treating infection.   HTN/tachycardia -resume metoprolol  Anxiety -on xanax -trying to avoid sedating meds  Code Status: full Family Communication:  Disposition Plan: SNF- in AM   Consultants:    Procedures:    Antibiotics:  Cefepime 1/6  vanc 1/6-1/8  HPI/Subjective: When asked how she is doing, she says "loney"  Objective: Filed Vitals:   12/31/13 0552  BP:   Pulse: 106  Temp:   Resp: 20    Intake/Output Summary (Last 24 hours) at 12/31/13 0818 Last data filed at 12/30/13 1331  Gross per 24 hour  Intake    480 ml  Output      0 ml  Net    480 ml   Filed Weights   12/29/13 0702 12/30/13 0615 12/31/13 0431  Weight: 89.6 kg (197 lb 8.5 oz) 89.7 kg (197 lb 12 oz) 89.7 kg (197 lb 12 oz)    Exam:  GENERAL- Awake, alert, in no acute distress.  CARDIAC- RRR, no murmurs, rubs or gallops.  RESP-  decreased b/l bilaterally- No rales or wheezes.  ABDOMEN- Soft,non tender,no palpable masses or organomegaly, bowel sounds reduced.  EXTREMITIES- pulse 2+, symmetric. +1 upper and lower extr edema.  SKIN- Warm, dry, No rash or lesion.   Data Reviewed: Basic Metabolic Panel:  Recent Labs Lab 12/25/13 1424 12/26/13 0500 12/28/13 0651  NA 135* 144 143  K 4.3 3.6* 3.5*  CL 93* 106 104  CO2 28 29 30   GLUCOSE 297* 274* 209*  BUN 14 8 3*  CREATININE 0.83 0.52 0.46*  CALCIUM 8.3* 7.9* 8.3*   Liver Function Tests:  Recent Labs Lab 12/25/13 1424  AST 12  ALT 10  ALKPHOS 58  BILITOT 0.4  PROT 7.1  ALBUMIN 3.0*   No results found for this basename: LIPASE, AMYLASE,  in the last 168 hours No results found for this basename: AMMONIA,  in the last 168 hours CBC:  Recent Labs Lab 12/25/13 1424 12/26/13 0500 12/28/13 0651  WBC 13.6* 9.3 4.7  NEUTROABS 10.3*  --   --   HGB 10.6* 9.7* 9.9*  HCT 33.6* 31.8* 32.9*  MCV 90.6 92.2 93.2  PLT 151 139* 172   Cardiac Enzymes:  Recent Labs Lab 12/25/13 2100 12/27/13 0050 12/27/13 0552 12/27/13 1250  TROPONINI <0.30 <0.30 <0.30 <0.30   BNP (last 3 results)  Recent Labs  12/30/13 1326  PROBNP 1309.0*  CBG:  Recent Labs Lab 12/30/13 0755 12/30/13 1156 12/30/13 1749 12/30/13 2233 12/31/13 0750  GLUCAP 211* 212* 264* 264* 206*    Recent Results (from the past 240 hour(s))  CULTURE, BLOOD (ROUTINE X 2)     Status: None   Collection Time    12/25/13  2:45 PM      Result Value Range Status   Specimen Description BLOOD HAND RIGHT   Final   Special Requests     Final   Value: BOTTLES DRAWN AEROBIC AND ANAEROBIC 10CCBLUE 9CCRED   Culture  Setup Time     Final   Value: 12/25/2013 20:23     Performed at [facility]   Culture     Final   Value:        BLOOD CULTURE RECEIVED NO GROWTH TO DATE CULTURE WILL BE HELD FOR 5 DAYS BEFORE ISSUING A FINAL NEGATIVE REPORT     Performed at [facility]    Report Status PENDING   Incomplete  CULTURE, BLOOD (ROUTINE X 2)     Status: None   Collection Time    12/25/13  2:52 PM      Result Value Range Status   Specimen Description BLOOD HAND LEFT   Final   Special Requests BOTTLES DRAWN AEROBIC ONLY 10CC   Final   Culture  Setup Time     Final   Value: 12/25/2013 20:23     Performed at [facility]   Culture     Final   Value:        BLOOD CULTURE RECEIVED NO GROWTH TO DATE CULTURE WILL BE HELD FOR 5 DAYS BEFORE ISSUING A FINAL NEGATIVE REPORT     Performed at [facility]   Report Status PENDING   Incomplete  URINE CULTURE     Status: None   Collection Time    12/25/13  3:19 PM      Result Value Range Status   Specimen Description URINE, CATHETERIZED   Final   Special Requests NONE   Final   Culture  Setup Time     Final   Value: 12/25/2013 20:28     Performed at [facility] Count     Final   Value: >=100,000 COLONIES/ML     Performed at [facility]   Culture     Final   Value: ESCHERICHIA COLI     Performed at [facility]   Report Status 12/28/2013 FINAL   Final   Organism ID, Bacteria ESCHERICHIA COLI   Final  MRSA PCR SCREENING     Status: Abnormal   Collection Time    12/25/13  7:38 PM      Result Value Range Status   MRSA by PCR POSITIVE (*) NEGATIVE Final   Comment:            The GeneXpert MRSA Assay (FDA     approved for NASAL specimens     only), is one component of a     comprehensive MRSA colonization     surveillance program. It is not     intended to diagnose MRSA     infection nor to guide or     monitor treatment for     MRSA infections.     RESULT CALLED TO, READ BACK BY AND VERIFIED WITHGraylin Shiver RN [id number] 12/25/13 A BROWNING  CLOSTRIDIUM DIFFICILE BY PCR     Status: None  Collection Time    12/26/13  2:34 PM      Result Value Range Status   C difficile by pcr NEGATIVE  NEGATIVE Final     Studies: Dg Chest Port 1 View  12/30/2013   CLINICAL  DATA:  Hypoxia  EXAM: PORTABLE CHEST - 1 VIEW  COMPARISON:  12/25/2013  FINDINGS: Cardiac shadow is stable. The lungs are well aerated bilaterally. Diffuse interstitial changes are again identified. The previously seen left-sided central venous line is no longer visualized. No acute bony abnormality is seen. Chronic changes about the proximal right humerus are seen.  IMPRESSION: Chronic changes without acute abnormality.   Electronically Signed   By: Alcide Clever M.D.   On: 12/30/2013 14:44    Scheduled Meds: . ALPRAZolam  1 mg Oral QID  . ciprofloxacin  500 mg Oral BID  . divalproex  250 mg Oral BID  . feeding supplement (GLUCERNA SHAKE)  237 mL Oral TID BM  . gabapentin  900 mg Oral TID  . heparin  5,000 Units Subcutaneous Q8H  . insulin aspart  0-15 Units Subcutaneous TID WC  . insulin aspart  0-5 Units Subcutaneous QHS  . insulin glargine  14 Units Subcutaneous Daily  . ipratropium  0.5 mg Nebulization QID  . levalbuterol  1.25 mg Nebulization QID  . metoprolol tartrate  25 mg Oral BID  . nystatin  5 mL Oral QID  . QUEtiapine  200 mg Oral QHS  . QUEtiapine  75 mg Oral Daily  . traZODone  100 mg Oral QHS  . venlafaxine XR  150 mg Oral BH-q7a   Continuous Infusions:    Principal Problem:   Septic shock Active Problems:   DIABETES MELLITUS, TYPE II   UTI   Acute encephalopathy    Time spent: 25 min    Benjamine Mola Lucero Ide  [facility] Pager [phone] If 7PM-7AM, please contact night-coverage at [email or web address], password [facility] 12/31/2013, 8:18 AM  LOS: 6 days

## 2013-12-31 NOTE — Clinical Social Work Note (Signed)
Per RNCM, patient is for possible DC on 01/01/14. CSW will assist with DC back to [facility]. CSW tried to contact [name] to update, but she was not available at work, and her mobile number is disconnected. CSW contacted other daughter Johna Sheriff, and spoke with the patient's granddaughter. She states that she will try to update [name], but states that the two sisters have a very tenuous relationship at this time.   Liz Beach, [place], [place], [phone]

## 2013-12-31 NOTE — Progress Notes (Signed)
Inpatient Diabetes Program Recommendations  AACE/ADA: New Consensus Statement on Inpatient Glycemic Control (2013)  Target Ranges:  Prepandial:   less than 140 mg/dL      Peak postprandial:   less than 180 mg/dL (1-2 hours)      Critically ill patients:  140 - 180 mg/dL   Reason for Visit: Results for [name], [name] (MRN [id number]) as of 12/31/2013 12:09  Ref. Range 12/30/2013 07:55 12/30/2013 11:56 12/30/2013 17:49 12/30/2013 22:33 12/31/2013 07:50  Glucose-Capillary Latest Range: 70-99 mg/dL 211 (H) 212 (H) 264 (H) 264 (H) 206 (H)   Please increase Lantus to 22 units daily.  CBG's continue to be greater than goal.  Thanks, Adah Perl, RN, BC-ADM Inpatient Diabetes Coordinator Pager [phone]

## 2013-12-31 NOTE — Progress Notes (Signed)
Patient asked for a breathing treatment and RN called resp. Awaiting therapist

## 2014-01-01 DIAGNOSIS — A419 Sepsis, unspecified organism: Secondary | ICD-10-CM

## 2014-01-01 LAB — GLUCOSE, CAPILLARY
Glucose-Capillary: 222 mg/dL — ABNORMAL HIGH (ref 70–99)
Glucose-Capillary: 260 mg/dL — ABNORMAL HIGH (ref 70–99)

## 2014-01-01 MED ORDER — LEVALBUTEROL HCL 1.25 MG/0.5ML IN NEBU: 1.2500 mg | INHALATION_SOLUTION | Freq: Four times a day (QID) | RESPIRATORY_TRACT | Status: DC

## 2014-01-01 MED ORDER — POLYETHYLENE GLYCOL 3350 17 G PO PACK
17.0000 g | PACK | Freq: Every day | ORAL | Status: DC
  Administered 2014-01-01: 13:00:00 17 g via ORAL
  Filled 2014-01-01 (×2): qty 1

## 2014-01-01 MED ORDER — GLUCERNA SHAKE PO LIQD: 237.0000 mL | Freq: Three times a day (TID) | ORAL | Status: DC

## 2014-01-01 MED ORDER — METOPROLOL TARTRATE 25 MG PO TABS: 25.0000 mg | ORAL_TABLET | Freq: Two times a day (BID) | ORAL | Status: DC

## 2014-01-01 MED ORDER — NYSTATIN 100000 UNIT/ML MT SUSP: 5.0000 mL | Freq: Four times a day (QID) | OROMUCOSAL | Status: DC

## 2014-01-01 MED ORDER — ALBUTEROL SULFATE (2.5 MG/3ML) 0.083% IN NEBU: 2.5000 mg | INHALATION_SOLUTION | RESPIRATORY_TRACT | Status: DC | PRN

## 2014-01-01 MED ORDER — HYDROCODONE-ACETAMINOPHEN 5-325 MG PO TABS: 2.0000 | ORAL_TABLET | Freq: Three times a day (TID) | ORAL | Status: DC

## 2014-01-01 MED ORDER — GABAPENTIN 300 MG PO CAPS: 900.0000 mg | ORAL_CAPSULE | Freq: Three times a day (TID) | ORAL | Status: AC

## 2014-01-01 MED ORDER — INSULIN GLARGINE 100 UNIT/ML ~~LOC~~ SOLN: 22.0000 [IU] | Freq: Every day | SUBCUTANEOUS | Status: DC

## 2014-01-01 MED ORDER — CALCIUM CARBONATE ANTACID 500 MG PO CHEW
1.0000 | CHEWABLE_TABLET | Freq: Three times a day (TID) | ORAL | Status: DC
  Administered 2014-01-01: 13:00:00 200 mg via ORAL
  Filled 2014-01-01 (×3): qty 1

## 2014-01-01 MED ORDER — ALPRAZOLAM 1 MG PO TABS: 1.0000 mg | ORAL_TABLET | Freq: Four times a day (QID) | ORAL | Status: DC

## 2014-01-01 MED ORDER — PANTOPRAZOLE SODIUM 40 MG PO TBEC
40.0000 mg | DELAYED_RELEASE_TABLET | Freq: Every day | ORAL | Status: DC
Start: 2014-01-01 — End: 2014-01-01
  Administered 2014-01-01: 10:00:00 40 mg via ORAL
  Filled 2014-01-01: qty 1

## 2014-01-01 MED ORDER — CIPROFLOXACIN HCL 500 MG PO TABS: 500.0000 mg | ORAL_TABLET | Freq: Two times a day (BID) | ORAL | Status: DC

## 2014-01-01 NOTE — Progress Notes (Signed)
Pt prepared for d/c to SNF. IV d/c'd. Skin intact except as most recently charted. Vitals are stable. Report called to receiving facility. Pt to be transported by ambulance service. 

## 2014-01-01 NOTE — Progress Notes (Signed)
Physical Therapy Treatment Patient Details Name: [name] MRN: [id number] DOB: 12-15-1941 Today's Date: 01/01/2014 Time: 5784-6962 PT Time Calculation (min): 37 min  PT Assessment / Plan / Recommendation  History of Present Illness 73 Y O F from a nursing home was admitted on 1/7 with sepsis of a urinary source.   PT Comments   Pt very anxious when up today with PT and only able to take several steps forward to perform stand pivot transfer x 2 reps.  She was able to perform seated therex.  Pt to D/C today to SNF.   Follow Up Recommendations  SNF     Does the patient have the potential to tolerate intense rehabilitation     Barriers to Discharge        Equipment Recommendations  None recommended by PT    Recommendations for Other Services    Frequency Min 2X/week   Progress towards PT Goals Progress towards PT goals: Progressing toward goals  Plan Current plan remains appropriate    Precautions / Restrictions Restrictions Weight Bearing Restrictions: No   Pertinent Vitals/Pain No stated pain, performed all mobility on 3L O2    Mobility  Bed Mobility Overal bed mobility:  (Pt OOB in recliner when PT arrived) Transfers Overall transfer level: Needs assistance Equipment used: Rolling walker (2 wheeled) Transfers: Sit to/from [facility] Sit to Stand: Mod assist Stand pivot transfers: Mod assist General transfer comment: Pt agreeable to stand and take several steps forward.  Requires mod assist to fully attain standing position and to ensure controlled descent.  Note once pt up standing, pt got very anxious and was only able to take several steps forward, then quickly stepped backwards to chair.  Performed stand pivot transfer x 2 reps in order to increase strength, activity tolerance and functional mobility Ambulation/Gait Ambulation/Gait assistance: Mod assist Ambulation Distance (Feet): 4 Feet Assistive device: Rolling walker (2 wheeled) Gait  Pattern/deviations: Step-to pattern;Ataxic;Shuffle    Exercises General Exercises - Lower Extremity Ankle Circles/Pumps: AROM;Both;10 reps Heel Slides: AAROM;Both;10 reps Hip ABduction/ADduction: AAROM;Both;10 reps Straight Leg Raises: AAROM;Both;10 reps   PT Diagnosis:    PT Problem List:   PT Treatment Interventions:     PT Goals (current goals can now be found in the care plan section) Acute Rehab PT Goals PT Goal Formulation: With patient Time For Goal Achievement: 01/04/14 Potential to Achieve Goals: Good  Visit Information  Last PT Received On: 01/01/14 Assistance Needed: +2 (+2 if amb) History of Present Illness: 22 Y O F from a nursing home was admitted on 1/7 with sepsis of a urinary source.    Subjective Data      Cognition  Cognition Arousal/Alertness: Awake/alert Behavior During Therapy: Anxious Overall Cognitive Status: No family/caregiver present to determine baseline cognitive functioning    Balance     End of Session PT - End of Session Equipment Utilized During Treatment: Gait belt;Oxygen Activity Tolerance: Patient tolerated treatment well (anxious) Patient left: in chair;with call bell/phone within reach;with chair alarm set Nurse Communication: Mobility status   GP     Denice Bors 01/01/2014, 1:58 PM

## 2014-01-01 NOTE — Clinical Social Work Note (Signed)
Per MD patient ready to DC back to [facility] today. RN, patient, daughter, and facility notified of DC. DC packet left on chart. Ambulance transport requested for patient. CSW signing off at this time.   Liz Beach, [place], [place], [phone]

## 2014-01-01 NOTE — Progress Notes (Signed)
Pt's daughter reported that the ambulance service from [facility] had an old meds list, not current doses. Also she reports that the patient was febrile at [facility] with painful urination since 12/31.

## 2014-01-01 NOTE — Discharge Summary (Signed)
Physician Discharge Summary  [name] GEX:[id number] DOB: [birth year] DOA: 12/25/2013  PCP: No primary provider on file.  Admit date: 12/25/2013 Discharge date: 01/01/2014  Time spent: 35 minutes  Recommendations for Outpatient Follow-up:  1. Urology follow up for bladder emptying studies 2. Pulmonary follow up of nodules 3. Outpatient PET scan 4. Repeat CT scan of chest 4-6 weeks re nodules 5. Please get patient a recliner for room 6. Wean off O2 as tolerated- 2L 7. D/C cipro after 3 days   Discharge Diagnoses:  Principal Problem:   Septic shock Active Problems:   DIABETES MELLITUS, TYPE II   UTI   Acute encephalopathy   Discharge Condition: improved  Diet recommendation: carb mod  Filed Weights   12/30/13 0615 12/31/13 0431 01/01/14 0611  Weight: 89.7 kg (197 lb 12 oz) 89.7 kg (197 lb 12 oz) 88.9 kg (195 lb 15.8 oz)    History of present illness:  Hx could not be ascertained, as family was not present and patient was lethargic. Patient was brought in from a skilled nursing home- [facility], with complaints of fever and generalized weakness which started today. As per nursing home notes today patient had no complaints and was apparently doing ok. Patient got 4L of N/s in the Ed, without appreciable increase in blood pressure and so as started on Levofed infusion   Hospital Course:  COPD not in exacerbation  -Continue home meds- nebs- Q6H  Add PRN albuterol  -CTA shows pulm nodules- PET and CT scan follow up -chest x ray does not show active disease  -suspect some is psych as patient keeps saying she is lonely    Septic shock- Focus- Urinary- e coli  - d/c IVF  - Levophed off  -Cefepime day #4-- change to cipro for 10 days total - Urine cultures ecoli  - Blood cultures NGTD  - Influenza panel -ve   Neurogenic bladder (daughter concerned about urine retention)  -patient voiding  -outpatient urology  Leukocytosis- likely due to  sepsis- from UTI. Resolved.   - Anemia- appears chronic and at baseline.   DM  - Lantus-- increase  -SSI   Acute encephalopthy- Likely due to Sepsis Vs poly pharmacy (Patient is on a lot of psychoactive medications). Baseline unknown, appears improved from previous.  - Restarted home psych meds.  - Continue treating infection.   HTN/tachycardia  -resume metoprolol   Anxiety  -on xanax  -trying to avoid sedating meds      Procedures:  CTA  Consultations:  tx out ICU  Discharge Exam: Filed Vitals:   01/01/14 1002  BP: 135/74  Pulse: 117  Temp:   Resp:     General: tearful Cardiovascular: rrr Respiratory: decreased b/l no wheezing  Discharge Instructions     Medication List    ASK your doctor about these medications       acetaminophen 325 MG tablet  Commonly known as:  TYLENOL  Take 650 mg by mouth every 4 (four) hours as needed for mild pain.     ALPRAZolam 1 MG tablet  Commonly known as:  XANAX  Take 1 mg by mouth 4 (four) times daily. At 6AM, 10AM, 2PM, and 6PM     antiseptic oral rinse Liqd  15 mLs by Mouth Rinse route every 4 (four) hours as needed for dry mouth.     aspirin 81 MG chewable tablet  Chew 81 mg by mouth daily.     bisacodyl 10 MG suppository  Commonly  known as:  DULCOLAX  Place 10 mg rectally 3 (three) times a week. On Monday, Wednesday, and Friday     divalproex 250 MG 24 hr tablet  Commonly known as:  DEPAKOTE ER  Take 250 mg by mouth 2 (two) times daily.     fish oil-omega-3 fatty acids 1000 MG capsule  Take 1 g by mouth 2 (two) times daily.     Fluticasone-Salmeterol 500-50 MCG/DOSE Aepb  Commonly known as:  ADVAIR DISKUS  Inhale 1 puff into the lungs 2 (two) times daily.     gabapentin 300 MG capsule  Commonly known as:  NEURONTIN  Take 900 mg by mouth 3 (three) times daily.     HYDROcodone-acetaminophen 5-325 MG per tablet  Commonly known as:  NORCO/VICODIN  Take 2 tablets by mouth every 8 (eight) hours.      insulin aspart 100 UNIT/ML injection  Commonly known as:  novoLOG  Inject 3 Units into the skin 3 (three) times daily before meals. 3 units prior to meals with an additional 5 units For cbg >=150     insulin glargine 100 UNIT/ML injection  Commonly known as:  LANTUS  Inject 0.2 mLs (20 Units total) into the skin at bedtime.     ipratropium-albuterol 0.5-2.5 (3) MG/3ML Soln  Commonly known as:  DUONEB  Take 3 mLs by nebulization every 6 (six) hours.     levalbuterol 45 MCG/ACT inhaler  Commonly known as:  XOPENEX HFA  Inhale 2 puffs into the lungs every 4 (four) hours as needed for wheezing.     loperamide 2 MG capsule  Commonly known as:  IMODIUM  Take 2 mg by mouth See admin instructions. Give 1 tablet after each loose stool PRN. Not to exceed 8mg  in 24 hours.     IMODIUM A-D PO  Take 4 mg by mouth See admin instructions. Give 1 tablet after first loose stool PRN     menthol-cetylpyridinium 3 MG lozenge  Commonly known as:  CEPACOL  Take 1 lozenge by mouth every 4 (four) hours.     metFORMIN 1000 MG tablet  Commonly known as:  GLUCOPHAGE  Take 1,000-1,500 mg by mouth 2 (two) times daily with a meal. 1500 mg in the am with breakfast and and 1000mg  before supper     metoprolol tartrate 25 MG tablet  Commonly known as:  LOPRESSOR  Take 25 mg by mouth 2 (two) times daily.     MILK OF MAGNESIA PO  Take 30 mLs by mouth See admin instructions. If no BM in 3 days, give 30ml in a 24 hours PRN.     MYLANTA PO  Take 30 mLs by mouth every 4 (four) hours as needed (indigestion).     omeprazole 20 MG capsule  Commonly known as:  PRILOSEC  Take 20 mg by mouth 2 (two) times daily.     OxyCODONE 10 mg T12a 12 hr tablet  Commonly known as:  OXYCONTIN  Take 1 tablet (10 mg total) by mouth every 12 (twelve) hours.     polyethylene glycol packet  Commonly known as:  MIRALAX / GLYCOLAX  Take 17 g by mouth daily.     potassium chloride 10 MEQ tablet  Commonly known as:   K-DUR,KLOR-CON  Take 5 mEq by mouth every evening.     promethazine 25 MG tablet  Commonly known as:  PHENERGAN  Take 25 mg by mouth every 6 (six) hours as needed. nausea     QUEtiapine 50 MG tablet  Commonly known as:  SEROQUEL  Take 75-200 mg by mouth 2 (two) times daily. 75mg  in the morning and 200mg  at bedtime.     REFRESH LACRI-LUBE Oint  Place 1 application into both eyes at bedtime.     REFRESH OPTIVE ADVANCED OP  Place 1 drop into both eyes 4 (four) times daily.     sennosides-docusate sodium 8.6-50 MG tablet  Commonly known as:  SENOKOT-S  Take 2 tablets by mouth at bedtime.     sennosides-docusate sodium 8.6-50 MG tablet  Commonly known as:  SENOKOT-S  Take 1 tablet by mouth daily as needed for constipation.     simvastatin 10 MG tablet  Commonly known as:  ZOCOR  Take 10 mg by mouth at bedtime.     sodium phosphate enema  Commonly known as:  FLEET  Place 1 enema rectally daily as needed (constipation). follow package directions     tiotropium 18 MCG inhalation capsule  Commonly known as:  SPIRIVA  Place 1 capsule (18 mcg total) into inhaler and inhale daily.     torsemide 20 MG tablet  Commonly known as:  DEMADEX  Take 40 mg by mouth every morning.     traZODone 100 MG tablet  Commonly known as:  DESYREL  Take 100 mg by mouth at bedtime.     traZODone 50 MG tablet  Commonly known as:  DESYREL  Take 25 mg by mouth daily as needed (anxiety).     venlafaxine XR 150 MG 24 hr capsule  Commonly known as:  EFFEXOR-XR  Take 150 mg by mouth every morning.       Allergies  Allergen Reactions  . Benzodiazepines Other (See Comments)    Mood dysregulation  (on MAR)  . Morphine And Related Other (See Comments)    Pain dysregulation Mood disruption  . Olanzapine     Hallucinations and disorientation  . Sulfamethoxazole-Trimethoprim Hives      The results of significant diagnostics from this hospitalization (including imaging, microbiology, ancillary  and laboratory) are listed below for reference.    Significant Diagnostic Studies: Ct Angio Chest Pe W/cm &/or Wo Cm  12/31/2013   CLINICAL DATA:  Hypoxia  EXAM: CT ANGIOGRAPHY CHEST WITH CONTRAST  TECHNIQUE: Multidetector CT imaging of the chest was performed using the standard protocol during bolus administration of intravenous contrast. Multiplanar CT image reconstructions including MIPs were obtained to evaluate the vascular anatomy.  CONTRAST:  OMNIPAQUE IOHEXOL 350 MG/ML SOLN  COMPARISON:  Chest radiograph December 30, 2013  FINDINGS: There is no demonstrable pulmonary embolus. There is no thoracic aortic aneurysm or dissection.  There is underlying emphysema. There is focal opacity in the right apex anteriorly measuring 2.7 x 1.6 cm. There is a nearby nodular opacity in the right apex measuring 1.1 x 1.1 cm. On axial slice 37, series 6, there is a 6 x 6 cm mass in the anterior segment of the left upper lobe.  There is consolidation in the posterior segment of the right lower lobe. There is left base atelectatic change.  There are multiple small mediastinal lymph nodes but no frank adenopathy by size criteria.  There is a small pericardial effusion. There are scattered foci of coronary artery calcification.  Visualized upper abdominal structures appear normal except for atherosclerotic change in the aorta and fatty change in the liver. There is degenerative change in the thoracic spine. There is slight anterior wedging of a lower thoracic vertebral body. There are no blastic or lytic bone lesions.  Thyroid appears normal.  Review of the MIP images confirms the above findings.  IMPRESSION: No demonstrable pulmonary embolus.  Underlying emphysema. There is airspace consolidation in the right base. There is left base atelectasis.  There are two somewhat nodular appearing opacities in the right apex as well as a smaller nodular opacity in the anterior segment of the left upper lobe. Neoplasm arising from  the right apex cannot be excluded on this study. In this circumstance, close clinical and imaging surveillance are warranted. If there is strong clinical concern for neoplasia, preceding with PET study at this time would be reasonable. At a minimum, a followup chest CT and 4-6 weeks to assess for potential interval clearing would be advised.  There is a small pericardial effusion.  There are multiple small mediastinal lymph nodes but no frank adenopathy on this study.   Electronically Signed   By: Bretta Bang M.D.   On: 12/31/2013 10:56   Dg Chest Port 1 View  12/30/2013   CLINICAL DATA:  Hypoxia  EXAM: PORTABLE CHEST - 1 VIEW  COMPARISON:  12/25/2013  FINDINGS: Cardiac shadow is stable. The lungs are well aerated bilaterally. Diffuse interstitial changes are again identified. The previously seen left-sided central venous line is no longer visualized. No acute bony abnormality is seen. Chronic changes about the proximal right humerus are seen.  IMPRESSION: Chronic changes without acute abnormality.   Electronically Signed   By: Alcide Clever M.D.   On: 12/30/2013 14:44   Dg Chest Port 1 View  12/25/2013   CLINICAL DATA:  Code sepsis.  EXAM: PORTABLE CHEST - 1 VIEW  COMPARISON:  Portable chest x-ray of December 25, 2013 at 1434 hr.  FINDINGS: The lungs remain mildly hypoinflated. The interstitial markings remain mildly increased. The hemidiaphragms are less well demonstrated currently. The cardiac silhouette remains enlarged. The pulmonary vascularity is somewhat indistinct though stable. No alveolar infiltrate is demonstrated. No large pleural effusion is demonstrated. There is a left internal jugular venous catheter in place whose tip lies in the region of the mid to distal SVC.  IMPRESSION: There has not been significant interval change in the appearance of the chest since the earlier study. Bilateral pulmonary hypo inflation accentuates the interstitial markings. No discrete alveolar pneumonia is  demonstrated.   Electronically Signed   By: David  [place]   On: 12/25/2013 18:39   Dg Chest Port 1 View  12/25/2013   CLINICAL DATA:  Fever and weakness.  EXAM: PORTABLE CHEST - 1 VIEW  COMPARISON:  PA and lateral chest 08/07/2012.  FINDINGS: Lung volumes are low with mild atelectasis in the bases. There is cardiomegaly without edema. No pneumothorax or pleural effusion.  IMPRESSION: No acute finding in a low volume chest.   Electronically Signed   By: Drusilla Kanner M.D.   On: 12/25/2013 15:02    Microbiology: Recent Results (from the past 240 hour(s))  CULTURE, BLOOD (ROUTINE X 2)     Status: None   Collection Time    12/25/13  2:45 PM      Result Value Range Status   Specimen Description BLOOD HAND RIGHT   Final   Special Requests     Final   Value: BOTTLES DRAWN AEROBIC AND ANAEROBIC 10CCBLUE 9CCRED   Culture  Setup Time     Final   Value: 12/25/2013 20:23     Performed at [facility]   Culture     Final   Value: NO GROWTH 5 DAYS  Performed at [facility]   Report Status 12/31/2013 FINAL   Final  CULTURE, BLOOD (ROUTINE X 2)     Status: None   Collection Time    12/25/13  2:52 PM      Result Value Range Status   Specimen Description BLOOD HAND LEFT   Final   Special Requests BOTTLES DRAWN AEROBIC ONLY 10CC   Final   Culture  Setup Time     Final   Value: 12/25/2013 20:23     Performed at [facility]   Culture     Final   Value: NO GROWTH 5 DAYS     Performed at [facility]   Report Status 12/31/2013 FINAL   Final  URINE CULTURE     Status: None   Collection Time    12/25/13  3:19 PM      Result Value Range Status   Specimen Description URINE, CATHETERIZED   Final   Special Requests NONE   Final   Culture  Setup Time     Final   Value: 12/25/2013 20:28     Performed at [facility] Count     Final   Value: >=100,000 COLONIES/ML     Performed at [facility]   Culture     Final   Value: ESCHERICHIA COLI      Performed at [facility]   Report Status 12/28/2013 FINAL   Final   Organism ID, Bacteria ESCHERICHIA COLI   Final  MRSA PCR SCREENING     Status: Abnormal   Collection Time    12/25/13  7:38 PM      Result Value Range Status   MRSA by PCR POSITIVE (*) NEGATIVE Final   Comment:            The GeneXpert MRSA Assay (FDA     approved for NASAL specimens     only), is one component of a     comprehensive MRSA colonization     surveillance program. It is not     intended to diagnose MRSA     infection nor to guide or     monitor treatment for     MRSA infections.     RESULT CALLED TO, READ BACK BY AND VERIFIED WITH:     Graylin Shiver RN [id number] 12/25/13 A BROWNING  CLOSTRIDIUM DIFFICILE BY PCR     Status: None   Collection Time    12/26/13  2:34 PM      Result Value Range Status   C difficile by pcr NEGATIVE  NEGATIVE Final  URINE CULTURE     Status: None   Collection Time    12/31/13  2:48 PM      Result Value Range Status   Specimen Description URINE, RANDOM   Final   Special Requests NONE   Final   Culture  Setup Time     Final   Value: 12/31/2013 22:02     Performed at [facility] Count     Final   Value: >=100,000 COLONIES/ML     Performed at [facility]   Culture     Final   Value: ESCHERICHIA COLI     Performed at [facility]   Report Status PENDING   Incomplete     Labs: Basic Metabolic Panel:  Recent Labs Lab 12/25/13 1424 12/26/13 0500 12/28/13 0651 12/31/13 0902  NA 135* 144 143 140  K 4.3 3.6* 3.5* 5.2  CL 93* 106 104 96  CO2 28 29 30  32  GLUCOSE 297* 274* 209* 264*  BUN 14 8 3* 8  CREATININE 0.83 0.52 0.46* 0.46*  CALCIUM 8.3* 7.9* 8.3* 9.2   Liver Function Tests:  Recent Labs Lab 12/25/13 1424  AST 12  ALT 10  ALKPHOS 58  BILITOT 0.4  PROT 7.1  ALBUMIN 3.0*   No results found for this basename: LIPASE, AMYLASE,  in the last 168 hours No results found for this basename: AMMONIA,  in the last  168 hours CBC:  Recent Labs Lab 12/25/13 1424 12/26/13 0500 12/28/13 0651  WBC 13.6* 9.3 4.7  NEUTROABS 10.3*  --   --   HGB 10.6* 9.7* 9.9*  HCT 33.6* 31.8* 32.9*  MCV 90.6 92.2 93.2  PLT 151 139* 172   Cardiac Enzymes:  Recent Labs Lab 12/25/13 2100 12/27/13 0050 12/27/13 0552 12/27/13 1250  TROPONINI <0.30 <0.30 <0.30 <0.30   BNP: BNP (last 3 results)  Recent Labs  12/30/13 1326  PROBNP 1309.0*   CBG:  Recent Labs Lab 12/31/13 0750 12/31/13 1151 12/31/13 1744 12/31/13 2123 01/01/14 0755  GLUCAP 206* 246* 232* 205* 222*       Signed:  Theophil Thivierge  [facility] 01/01/2014, 12:23 PM

## 2014-01-02 ENCOUNTER — Other Ambulatory Visit: Payer: Self-pay | Admitting: *Deleted

## 2014-01-02 LAB — URINE CULTURE: Colony Count: >=100000

## 2014-01-02 MED ORDER — OXYCODONE HCL ER 10 MG PO T12A: EXTENDED_RELEASE_TABLET | ORAL | Status: DC

## 2014-01-05 ENCOUNTER — Non-Acute Institutional Stay (SKILLED_NURSING_FACILITY): Payer: Medicare Other | Admitting: Internal Medicine

## 2014-01-05 DIAGNOSIS — R6520 Severe sepsis without septic shock: Secondary | ICD-10-CM

## 2014-01-05 DIAGNOSIS — R918 Other nonspecific abnormal finding of lung field: Secondary | ICD-10-CM

## 2014-01-05 DIAGNOSIS — J189 Pneumonia, unspecified organism: Secondary | ICD-10-CM

## 2014-01-05 DIAGNOSIS — J449 Chronic obstructive pulmonary disease, unspecified: Secondary | ICD-10-CM

## 2014-01-05 DIAGNOSIS — R6521 Severe sepsis with septic shock: Principal | ICD-10-CM

## 2014-01-05 DIAGNOSIS — A419 Sepsis, unspecified organism: Secondary | ICD-10-CM

## 2014-01-07 ENCOUNTER — Non-Acute Institutional Stay (SKILLED_NURSING_FACILITY): Payer: Medicare Other | Admitting: Internal Medicine

## 2014-01-07 DIAGNOSIS — B372 Candidiasis of skin and nail: Secondary | ICD-10-CM

## 2014-01-07 DIAGNOSIS — L22 Diaper dermatitis: Secondary | ICD-10-CM

## 2014-01-11 NOTE — Progress Notes (Signed)
         PROGRESS NOTE  DATE: 01/07/2014  FACILITY:  [facility]  LEVEL OF CARE: SNF (31)  Acute Visit  CHIEF COMPLAINT:  Manage rash under the breasts and abdominal folds  HISTORY OF PRESENT ILLNESS: I was requested by the staff to assess the patient regarding above problem(s):  Patient is having a rash under her bilateral breasts and abdominal folds for he for several days. Patient is complaining of itching. There are no other associated signs and symptoms. There is no temporal relationship.  PAST MEDICAL HISTORY : Reviewed.  No changes.  CURRENT MEDICATIONS: Reviewed per [facility]  PHYSICAL EXAMINATION  GENERAL: no acute distress, moderately obese body habitus SKIN: Erythema under the bilateral breasts and bilateral abdominal folds PSYCHIATRIC: the patient is alert & oriented to person, affect & behavior appropriate  ASSESSMENT/PLAN:  Candidiasis-new problem. Start nystatin cream daily to affected areas until resolution.  CPT CODE: 53646

## 2014-01-15 ENCOUNTER — Other Ambulatory Visit: Payer: Self-pay | Admitting: *Deleted

## 2014-01-15 MED ORDER — ALPRAZOLAM 1 MG PO TABS: ORAL_TABLET | ORAL | Status: DC

## 2014-01-21 ENCOUNTER — Emergency Department (HOSPITAL_COMMUNITY): Payer: Medicare Other

## 2014-01-21 ENCOUNTER — Inpatient Hospital Stay (HOSPITAL_COMMUNITY)
Admission: EM | Admit: 2014-01-21 | Discharge: 2014-02-05 | DRG: 870 | Disposition: A | Discharge status: Skilled Nursing Facility | Payer: Medicare Other | Attending: Internal Medicine | Admitting: Internal Medicine

## 2014-01-21 ENCOUNTER — Non-Acute Institutional Stay (SKILLED_NURSING_FACILITY): Payer: Medicare Other | Admitting: Internal Medicine

## 2014-01-21 ENCOUNTER — Encounter (HOSPITAL_COMMUNITY): Payer: Self-pay | Admitting: Emergency Medicine

## 2014-01-21 ENCOUNTER — Inpatient Hospital Stay (HOSPITAL_COMMUNITY): Payer: Medicare Other

## 2014-01-21 DIAGNOSIS — Z1612 Extended spectrum beta lactamase (ESBL) resistance: Secondary | ICD-10-CM

## 2014-01-21 DIAGNOSIS — J9600 Acute respiratory failure, unspecified whether with hypoxia or hypercapnia: Secondary | ICD-10-CM

## 2014-01-21 DIAGNOSIS — E1165 Type 2 diabetes mellitus with hyperglycemia: Secondary | ICD-10-CM

## 2014-01-21 DIAGNOSIS — G589 Mononeuropathy, unspecified: Secondary | ICD-10-CM | POA: Diagnosis present

## 2014-01-21 DIAGNOSIS — E1049 Type 1 diabetes mellitus with other diabetic neurological complication: Secondary | ICD-10-CM | POA: Diagnosis present

## 2014-01-21 DIAGNOSIS — Z9849 Cataract extraction status, unspecified eye: Secondary | ICD-10-CM

## 2014-01-21 DIAGNOSIS — J449 Chronic obstructive pulmonary disease, unspecified: Secondary | ICD-10-CM | POA: Diagnosis present

## 2014-01-21 DIAGNOSIS — F411 Generalized anxiety disorder: Secondary | ICD-10-CM

## 2014-01-21 DIAGNOSIS — R197 Diarrhea, unspecified: Secondary | ICD-10-CM | POA: Diagnosis present

## 2014-01-21 DIAGNOSIS — Z86711 Personal history of pulmonary embolism: Secondary | ICD-10-CM

## 2014-01-21 DIAGNOSIS — E872 Acidosis, unspecified: Secondary | ICD-10-CM | POA: Diagnosis present

## 2014-01-21 DIAGNOSIS — F418 Other specified anxiety disorders: Secondary | ICD-10-CM | POA: Diagnosis present

## 2014-01-21 DIAGNOSIS — Z8541 Personal history of malignant neoplasm of cervix uteri: Secondary | ICD-10-CM

## 2014-01-21 DIAGNOSIS — M129 Arthropathy, unspecified: Secondary | ICD-10-CM | POA: Diagnosis present

## 2014-01-21 DIAGNOSIS — I10 Essential (primary) hypertension: Secondary | ICD-10-CM | POA: Diagnosis present

## 2014-01-21 DIAGNOSIS — Z66 Do not resuscitate: Secondary | ICD-10-CM | POA: Diagnosis present

## 2014-01-21 DIAGNOSIS — E1159 Type 2 diabetes mellitus with other circulatory complications: Secondary | ICD-10-CM

## 2014-01-21 DIAGNOSIS — A498 Other bacterial infections of unspecified site: Secondary | ICD-10-CM

## 2014-01-21 DIAGNOSIS — R7989 Other specified abnormal findings of blood chemistry: Secondary | ICD-10-CM

## 2014-01-21 DIAGNOSIS — R000 Tachycardia, unspecified: Secondary | ICD-10-CM

## 2014-01-21 DIAGNOSIS — M810 Age-related osteoporosis without current pathological fracture: Secondary | ICD-10-CM | POA: Diagnosis present

## 2014-01-21 DIAGNOSIS — E1149 Type 2 diabetes mellitus with other diabetic neurological complication: Secondary | ICD-10-CM | POA: Diagnosis present

## 2014-01-21 DIAGNOSIS — N319 Neuromuscular dysfunction of bladder, unspecified: Secondary | ICD-10-CM | POA: Diagnosis present

## 2014-01-21 DIAGNOSIS — Z9981 Dependence on supplemental oxygen: Secondary | ICD-10-CM

## 2014-01-21 DIAGNOSIS — Z86718 Personal history of other venous thrombosis and embolism: Secondary | ICD-10-CM

## 2014-01-21 DIAGNOSIS — D638 Anemia in other chronic diseases classified elsewhere: Secondary | ICD-10-CM | POA: Diagnosis present

## 2014-01-21 DIAGNOSIS — IMO0002 Reserved for concepts with insufficient information to code with codable children: Secondary | ICD-10-CM | POA: Diagnosis present

## 2014-01-21 DIAGNOSIS — G8929 Other chronic pain: Secondary | ICD-10-CM | POA: Diagnosis present

## 2014-01-21 DIAGNOSIS — R32 Unspecified urinary incontinence: Secondary | ICD-10-CM | POA: Diagnosis present

## 2014-01-21 DIAGNOSIS — F319 Bipolar disorder, unspecified: Secondary | ICD-10-CM

## 2014-01-21 DIAGNOSIS — Z993 Dependence on wheelchair: Secondary | ICD-10-CM

## 2014-01-21 DIAGNOSIS — E876 Hypokalemia: Secondary | ICD-10-CM | POA: Diagnosis present

## 2014-01-21 DIAGNOSIS — J4489 Other specified chronic obstructive pulmonary disease: Secondary | ICD-10-CM | POA: Diagnosis present

## 2014-01-21 DIAGNOSIS — E669 Obesity, unspecified: Secondary | ICD-10-CM | POA: Diagnosis present

## 2014-01-21 DIAGNOSIS — Z7982 Long term (current) use of aspirin: Secondary | ICD-10-CM

## 2014-01-21 DIAGNOSIS — N390 Urinary tract infection, site not specified: Secondary | ICD-10-CM

## 2014-01-21 DIAGNOSIS — R6521 Severe sepsis with septic shock: Secondary | ICD-10-CM

## 2014-01-21 DIAGNOSIS — J9601 Acute respiratory failure with hypoxia: Secondary | ICD-10-CM | POA: Insufficient documentation

## 2014-01-21 DIAGNOSIS — R6520 Severe sepsis without septic shock: Secondary | ICD-10-CM

## 2014-01-21 DIAGNOSIS — I152 Hypertension secondary to endocrine disorders: Secondary | ICD-10-CM | POA: Diagnosis present

## 2014-01-21 DIAGNOSIS — F332 Major depressive disorder, recurrent severe without psychotic features: Secondary | ICD-10-CM | POA: Diagnosis present

## 2014-01-21 DIAGNOSIS — Z6835 Body mass index (BMI) 35.0-35.9, adult: Secondary | ICD-10-CM

## 2014-01-21 DIAGNOSIS — G4700 Insomnia, unspecified: Secondary | ICD-10-CM | POA: Diagnosis present

## 2014-01-21 DIAGNOSIS — R509 Fever, unspecified: Secondary | ICD-10-CM | POA: Insufficient documentation

## 2014-01-21 DIAGNOSIS — Z87891 Personal history of nicotine dependence: Secondary | ICD-10-CM

## 2014-01-21 DIAGNOSIS — G9341 Metabolic encephalopathy: Secondary | ICD-10-CM | POA: Diagnosis present

## 2014-01-21 DIAGNOSIS — D649 Anemia, unspecified: Secondary | ICD-10-CM

## 2014-01-21 DIAGNOSIS — E1065 Type 1 diabetes mellitus with hyperglycemia: Secondary | ICD-10-CM | POA: Diagnosis present

## 2014-01-21 DIAGNOSIS — E119 Type 2 diabetes mellitus without complications: Secondary | ICD-10-CM | POA: Diagnosis present

## 2014-01-21 DIAGNOSIS — Z794 Long term (current) use of insulin: Secondary | ICD-10-CM

## 2014-01-21 DIAGNOSIS — Z87440 Personal history of urinary (tract) infections: Secondary | ICD-10-CM

## 2014-01-21 DIAGNOSIS — G9340 Encephalopathy, unspecified: Secondary | ICD-10-CM

## 2014-01-21 DIAGNOSIS — A419 Sepsis, unspecified organism: Principal | ICD-10-CM

## 2014-01-21 DIAGNOSIS — J439 Emphysema, unspecified: Secondary | ICD-10-CM | POA: Diagnosis present

## 2014-01-21 DIAGNOSIS — E1169 Type 2 diabetes mellitus with other specified complication: Secondary | ICD-10-CM | POA: Diagnosis present

## 2014-01-21 DIAGNOSIS — K219 Gastro-esophageal reflux disease without esophagitis: Secondary | ICD-10-CM | POA: Diagnosis present

## 2014-01-21 DIAGNOSIS — Z515 Encounter for palliative care: Secondary | ICD-10-CM

## 2014-01-21 DIAGNOSIS — E785 Hyperlipidemia, unspecified: Secondary | ICD-10-CM | POA: Diagnosis present

## 2014-01-21 LAB — MRSA PCR SCREENING: MRSA by PCR: POSITIVE — AB

## 2014-01-21 LAB — COMPREHENSIVE METABOLIC PANEL
ALBUMIN: 3.2 g/dL — AB (ref 3.5–5.2)
ALK PHOS: 57 U/L (ref 39–117)
ALT: 14 U/L (ref 0–35)
AST: 30 U/L (ref 0–37)
BILIRUBIN TOTAL: 0.2 mg/dL — AB (ref 0.3–1.2)
BUN: 9 mg/dL (ref 6–23)
CHLORIDE: 98 meq/L (ref 96–112)
CO2: 28 meq/L (ref 19–32)
CREATININE: 0.52 mg/dL (ref 0.50–1.10)
Calcium: 8.8 mg/dL (ref 8.4–10.5)
GFR calc Af Amer: >90 mL/min (ref >90)
GFR calc non Af Amer: >90 mL/min (ref >90)
Glucose, Bld: 239 mg/dL — ABNORMAL HIGH (ref 70–99)
POTASSIUM: 5 meq/L (ref 3.7–5.3)
Sodium: 141 mEq/L (ref 137–147)
Total Protein: 7.1 g/dL (ref 6.0–8.3)

## 2014-01-21 LAB — GLUCOSE, CAPILLARY
GLUCOSE-CAPILLARY: 189 mg/dL — AB (ref 70–99)
Glucose-Capillary: 231 mg/dL — ABNORMAL HIGH (ref 70–99)
Glucose-Capillary: 183 mg/dL — ABNORMAL HIGH (ref 70–99)

## 2014-01-21 LAB — POCT I-STAT TROPONIN I: TROPONIN I, POC: 0 ng/mL (ref 0.00–0.08)

## 2014-01-21 LAB — CBC WITH DIFFERENTIAL/PLATELET
BASOS ABS: 0 10*3/uL (ref 0.0–0.1)
Basophils Relative: 0 % (ref 0–1)
Eosinophils Absolute: 0.1 10*3/uL (ref 0.0–0.7)
Eosinophils Relative: 1 % (ref 0–5)
HEMATOCRIT: 35.1 % — AB (ref 36.0–46.0)
Hemoglobin: 11 g/dL — ABNORMAL LOW (ref 12.0–15.0)
LYMPHS PCT: 7 % — AB (ref 12–46)
Lymphs Abs: 1 10*3/uL (ref 0.7–4.0)
MCH: 28.9 pg (ref 26.0–34.0)
MCHC: 31.3 g/dL (ref 30.0–36.0)
MCV: 92.4 fL (ref 78.0–100.0)
MONOS PCT: 6 % (ref 3–12)
Monocytes Absolute: 0.8 10*3/uL (ref 0.1–1.0)
NEUTROS ABS: 11.8 10*3/uL — AB (ref 1.7–7.7)
Neutrophils Relative %: 86 % — ABNORMAL HIGH (ref 43–77)
Platelets: ADEQUATE 10*3/uL (ref 150–400)
RBC: 3.8 MIL/uL — AB (ref 3.87–5.11)
RDW: 15.7 % — AB (ref 11.5–15.5)
WBC: 13.7 10*3/uL — ABNORMAL HIGH (ref 4.0–10.5)

## 2014-01-21 LAB — BLOOD GAS, ARTERIAL
ACID-BASE EXCESS: 3 mmol/L — AB (ref 0.0–2.0)
ACID-BASE EXCESS: 1 mmol/L (ref 0.0–2.0)
BICARBONATE: 29 meq/L — AB (ref 20.0–24.0)
Bicarbonate: 27 mEq/L — ABNORMAL HIGH (ref 20.0–24.0)
Drawn by: 295031
Drawn by: 11249
O2 Content: 3 L/min
O2 Content: 8 L/min
O2 SAT: 92.2 %
O2 Saturation: 93.9 %
PATIENT TEMPERATURE: 98.6
PATIENT TEMPERATURE: 38.9
TCO2: 27.1 mmol/L (ref 0–100)
TCO2: 25.5 mmol/L (ref 0–100)
pCO2 arterial: 54.9 mmHg — ABNORMAL HIGH (ref 35.0–45.0)
pCO2 arterial: 58.6 mmHg (ref 35.0–45.0)
pH, Arterial: 7.344 — ABNORMAL LOW (ref 7.350–7.450)
pH, Arterial: 7.297 — ABNORMAL LOW (ref 7.350–7.450)
pO2, Arterial: 67.3 mmHg — ABNORMAL LOW (ref 80.0–100.0)
pO2, Arterial: 81.2 mmHg (ref 80.0–100.0)

## 2014-01-21 LAB — URINALYSIS, ROUTINE W REFLEX MICROSCOPIC
Bilirubin Urine: NEGATIVE
GLUCOSE, UA: NEGATIVE mg/dL
KETONES UR: NEGATIVE mg/dL
Nitrite: POSITIVE — AB
PROTEIN: NEGATIVE mg/dL
Specific Gravity, Urine: 1.014 (ref 1.005–1.030)
UROBILINOGEN UA: 0.2 mg/dL (ref 0.0–1.0)
pH: 5.5 (ref 5.0–8.0)

## 2014-01-21 LAB — LIPASE, BLOOD: LIPASE: 14 U/L (ref 11–59)

## 2014-01-21 LAB — URINE MICROSCOPIC-ADD ON

## 2014-01-21 LAB — LACTIC ACID, PLASMA: Lactic Acid, Venous: 4.8 mmol/L — ABNORMAL HIGH (ref 0.5–2.2)

## 2014-01-21 LAB — PROTIME-INR
INR: 0.92 (ref 0.00–1.49)
PROTHROMBIN TIME: 12.2 s (ref 11.6–15.2)

## 2014-01-21 LAB — PROCALCITONIN: PROCALCITONIN: 2.2 ng/mL

## 2014-01-21 LAB — CG4 I-STAT (LACTIC ACID): Lactic Acid, Venous: 5 mmol/L — ABNORMAL HIGH (ref 0.5–2.2)

## 2014-01-21 MED ORDER — DEXTROSE 5 % IV SOLN
2.0000 g | Freq: Once | INTRAVENOUS | Status: AC
  Administered 2014-01-21: 2 g via INTRAVENOUS
  Filled 2014-01-21: qty 2

## 2014-01-21 MED ORDER — ALBUTEROL (5 MG/ML) CONTINUOUS INHALATION SOLN
10.0000 mg/h | INHALATION_SOLUTION | Freq: Once | RESPIRATORY_TRACT | Status: AC
  Administered 2014-01-21: 10 mg/h via RESPIRATORY_TRACT
  Filled 2014-01-21: qty 20

## 2014-01-21 MED ORDER — SODIUM CHLORIDE 0.9 % IV BOLUS (SEPSIS)
500.0000 mL | Freq: Once | INTRAVENOUS | Status: AC
  Administered 2014-01-21: 500 mL via INTRAVENOUS

## 2014-01-21 MED ORDER — ASPIRIN 81 MG PO CHEW
324.0000 mg | CHEWABLE_TABLET | ORAL | Status: AC
  Administered 2014-01-21: 324 mg via ORAL
  Filled 2014-01-21: qty 4

## 2014-01-21 MED ORDER — FUROSEMIDE 10 MG/ML IJ SOLN
20.0000 mg | Freq: Once | INTRAMUSCULAR | Status: AC
  Administered 2014-01-21: 20 mg via INTRAVENOUS
  Filled 2014-01-21: qty 2

## 2014-01-21 MED ORDER — FENTANYL CITRATE 0.05 MG/ML IJ SOLN
25.0000 ug | Freq: Once | INTRAMUSCULAR | Status: AC
  Administered 2014-01-21: 25 ug via INTRAVENOUS

## 2014-01-21 MED ORDER — HALOPERIDOL LACTATE 5 MG/ML IJ SOLN
5.0000 mg | Freq: Once | INTRAMUSCULAR | Status: AC
  Administered 2014-01-21: 5 mg via INTRAVENOUS

## 2014-01-21 MED ORDER — FAMOTIDINE IN NACL 20-0.9 MG/50ML-% IV SOLN
20.0000 mg | Freq: Two times a day (BID) | INTRAVENOUS | Status: DC
  Administered 2014-01-21 – 2014-01-26 (×10): 20 mg via INTRAVENOUS
  Filled 2014-01-21 (×14): qty 50

## 2014-01-21 MED ORDER — SODIUM CHLORIDE 0.9 % IV SOLN
1000.0000 mL | Freq: Once | INTRAVENOUS | Status: AC
  Administered 2014-01-21: 1000 mL via INTRAVENOUS

## 2014-01-21 MED ORDER — VANCOMYCIN HCL IN DEXTROSE 1-5 GM/200ML-% IV SOLN
1000.0000 mg | Freq: Once | INTRAVENOUS | Status: AC
  Administered 2014-01-21: 1000 mg via INTRAVENOUS
  Filled 2014-01-21 (×2): qty 200

## 2014-01-21 MED ORDER — SODIUM CHLORIDE 0.9 % IV SOLN
250.0000 mL | INTRAVENOUS | Status: DC | PRN
  Administered 2014-01-24: 500 mL via INTRAVENOUS
  Administered 2014-01-25: 250 mL via INTRAVENOUS

## 2014-01-21 MED ORDER — FENTANYL CITRATE 0.05 MG/ML IJ SOLN
INTRAMUSCULAR | Status: AC
  Administered 2014-01-21: 25 ug
  Filled 2014-01-21: qty 2

## 2014-01-21 MED ORDER — ACETAMINOPHEN 325 MG PO TABS
650.0000 mg | ORAL_TABLET | Freq: Once | ORAL | Status: AC
  Administered 2014-01-21: 650 mg via ORAL
  Filled 2014-01-21: qty 2

## 2014-01-21 MED ORDER — HALOPERIDOL LACTATE 5 MG/ML IJ SOLN
INTRAMUSCULAR | Status: AC
  Filled 2014-01-21: qty 1

## 2014-01-21 MED ORDER — HEPARIN SODIUM (PORCINE) 5000 UNIT/ML IJ SOLN
5000.0000 [IU] | Freq: Three times a day (TID) | INTRAMUSCULAR | Status: DC
  Administered 2014-01-21 – 2014-01-28 (×20): 5000 [IU] via SUBCUTANEOUS
  Filled 2014-01-21 (×23): qty 1

## 2014-01-21 MED ORDER — FENTANYL CITRATE 0.05 MG/ML IJ SOLN
25.0000 ug | Freq: Once | INTRAMUSCULAR | Status: DC
  Filled 2014-01-21: qty 2

## 2014-01-21 MED ORDER — MIDAZOLAM HCL 2 MG/2ML IJ SOLN
2.0000 mg | Freq: Once | INTRAMUSCULAR | Status: AC
  Administered 2014-01-21: 2 mg via INTRAVENOUS

## 2014-01-21 MED ORDER — CHLORHEXIDINE GLUCONATE CLOTH 2 % EX PADS
6.0000 | MEDICATED_PAD | Freq: Every day | CUTANEOUS | Status: AC
  Administered 2014-01-22 – 2014-01-26 (×5): 6 via TOPICAL

## 2014-01-21 MED ORDER — ACETAMINOPHEN 160 MG/5ML PO SOLN
650.0000 mg | Freq: Once | ORAL | Status: DC
  Filled 2014-01-21: qty 20.3

## 2014-01-21 MED ORDER — INSULIN ASPART 100 UNIT/ML ~~LOC~~ SOLN
0.0000 [IU] | Freq: Three times a day (TID) | SUBCUTANEOUS | Status: DC
  Administered 2014-01-21: 3 [IU] via SUBCUTANEOUS
  Filled 2014-01-21: qty 1

## 2014-01-21 MED ORDER — ASPIRIN 300 MG RE SUPP: 300.0000 mg | RECTAL | Status: AC

## 2014-01-21 MED ORDER — MIDAZOLAM HCL 2 MG/2ML IJ SOLN
INTRAMUSCULAR | Status: AC
  Filled 2014-01-21: qty 2

## 2014-01-21 MED ORDER — TIOTROPIUM BROMIDE MONOHYDRATE 18 MCG IN CAPS
18.0000 ug | ORAL_CAPSULE | Freq: Every day | RESPIRATORY_TRACT | Status: DC
  Administered 2014-01-22: 18 ug via RESPIRATORY_TRACT
  Filled 2014-01-21: qty 5

## 2014-01-21 MED ORDER — IPRATROPIUM-ALBUTEROL 0.5-2.5 (3) MG/3ML IN SOLN
3.0000 mL | RESPIRATORY_TRACT | Status: AC
  Administered 2014-01-21: 3 mL via RESPIRATORY_TRACT

## 2014-01-21 MED ORDER — SODIUM CHLORIDE 0.9 % IV SOLN
1000.0000 mL | INTRAVENOUS | Status: DC
  Administered 2014-01-21 – 2014-01-23 (×4): 1000 mL via INTRAVENOUS

## 2014-01-21 MED ORDER — INSULIN ASPART 100 UNIT/ML ~~LOC~~ SOLN
0.0000 [IU] | SUBCUTANEOUS | Status: DC
  Administered 2014-01-21 – 2014-01-22 (×3): 2 [IU] via SUBCUTANEOUS
  Administered 2014-01-22: 1 [IU] via SUBCUTANEOUS
  Administered 2014-01-22 – 2014-01-23 (×6): 2 [IU] via SUBCUTANEOUS
  Administered 2014-01-23: 3 [IU] via SUBCUTANEOUS
  Administered 2014-01-23: 2 [IU] via SUBCUTANEOUS
  Administered 2014-01-23: 3 [IU] via SUBCUTANEOUS
  Administered 2014-01-23 – 2014-01-24 (×3): 2 [IU] via SUBCUTANEOUS
  Administered 2014-01-24 – 2014-01-25 (×5): 3 [IU] via SUBCUTANEOUS
  Administered 2014-01-25: 2 [IU] via SUBCUTANEOUS
  Administered 2014-01-25: 3 [IU] via SUBCUTANEOUS

## 2014-01-21 MED ORDER — SODIUM CHLORIDE 0.9 % IV BOLUS (SEPSIS)
1000.0000 mL | INTRAVENOUS | Status: AC
  Administered 2014-01-21: 1000 mL via INTRAVENOUS

## 2014-01-21 MED ORDER — MUPIROCIN 2 % EX OINT
1.0000 "application " | TOPICAL_OINTMENT | Freq: Two times a day (BID) | CUTANEOUS | Status: AC
  Administered 2014-01-22 – 2014-01-26 (×10): 1 via NASAL
  Filled 2014-01-21: qty 22

## 2014-01-21 MED ORDER — SODIUM CHLORIDE 0.9 % IV SOLN
500.0000 mg | INTRAVENOUS | Status: AC
  Administered 2014-01-21: 500 mg via INTRAVENOUS
  Filled 2014-01-21 (×2): qty 500

## 2014-01-21 MED ORDER — ACETAMINOPHEN 650 MG RE SUPP
650.0000 mg | Freq: Once | RECTAL | Status: AC
  Administered 2014-01-21: 650 mg via RECTAL
  Filled 2014-01-21: qty 1

## 2014-01-21 MED ORDER — ALBUTEROL SULFATE (2.5 MG/3ML) 0.083% IN NEBU
2.5000 mg | INHALATION_SOLUTION | RESPIRATORY_TRACT | Status: DC | PRN
  Administered 2014-01-22: 2.5 mg via RESPIRATORY_TRACT
  Filled 2014-01-21: qty 3

## 2014-01-21 MED ORDER — MIDAZOLAM HCL 2 MG/2ML IJ SOLN
INTRAMUSCULAR | Status: AC
  Administered 2014-01-21: 2 mg
  Filled 2014-01-21: qty 2

## 2014-01-21 MED ORDER — SODIUM CHLORIDE 0.9 % IV SOLN: INTRAVENOUS | Status: DC

## 2014-01-21 MED ORDER — SODIUM CHLORIDE 0.9 % IV BOLUS (SEPSIS): 1000.0000 mL | INTRAVENOUS | Status: DC | PRN

## 2014-01-21 MED ORDER — MIDAZOLAM HCL 10 MG/2ML IJ SOLN
2.0000 mg | Freq: Once | INTRAMUSCULAR | Status: AC
  Administered 2014-01-21: 2 mg via INTRAVENOUS

## 2014-01-21 MED ORDER — FENTANYL CITRATE 0.05 MG/ML IJ SOLN
INTRAMUSCULAR | Status: AC
  Filled 2014-01-21: qty 2

## 2014-01-21 MED ORDER — NOREPINEPHRINE BITARTRATE 1 MG/ML IJ SOLN
5.0000 ug/min | INTRAVENOUS | Status: DC
  Filled 2014-01-21: qty 4

## 2014-01-21 MED ORDER — VANCOMYCIN HCL IN DEXTROSE 1-5 GM/200ML-% IV SOLN
1000.0000 mg | Freq: Two times a day (BID) | INTRAVENOUS | Status: DC
  Administered 2014-01-21 – 2014-01-23 (×5): 1000 mg via INTRAVENOUS
  Filled 2014-01-21 (×6): qty 200

## 2014-01-21 MED ORDER — SODIUM CHLORIDE 0.9 % IV SOLN
500.0000 mg | Freq: Three times a day (TID) | INTRAVENOUS | Status: DC
  Administered 2014-01-21 – 2014-02-02 (×35): 500 mg via INTRAVENOUS
  Filled 2014-01-21 (×37): qty 500

## 2014-01-21 MED ORDER — SODIUM CHLORIDE 0.9 % IV SOLN
500.0000 mg | Freq: Four times a day (QID) | INTRAVENOUS | Status: DC
  Filled 2014-01-21 (×2): qty 500

## 2014-01-21 MED ORDER — MOMETASONE FURO-FORMOTEROL FUM 200-5 MCG/ACT IN AERO
2.0000 | INHALATION_SPRAY | Freq: Two times a day (BID) | RESPIRATORY_TRACT | Status: DC
  Administered 2014-01-22: 2 via RESPIRATORY_TRACT
  Filled 2014-01-21: qty 8.8

## 2014-01-21 NOTE — ED Notes (Signed)
MD Titus Mould  At bedside for central line placement.

## 2014-01-21 NOTE — Procedures (Signed)
Central Venous Catheter Insertion Procedure Note [name] [id number] 1941-12-02  Procedure: Insertion of Central Venous Catheter Indications: Assessment of intravascular volume, Drug and/or fluid administration and Frequent blood sampling shock  Procedure Details Consent: Unable to obtain consent because of emergent medical necessity. Time Out: Verified patient identification, verified procedure, site/side was marked, verified correct patient position, special equipment/implants available, medications/allergies/relevent history reviewed, required imaging and test results available.  Performed  Maximum sterile technique was used including antiseptics, cap, gloves, gown, hand hygiene, mask and sheet. Skin prep: Chlorhexidine; local anesthetic administered A antimicrobial bonded/coated triple lumen catheter was placed in the left internal jugular vein using the Seldinger technique.  Evaluation Blood flow good Complications: No apparent complications Patient did tolerate procedure well. Chest X-ray ordered to verify placement.  CXR: pending.  [name] 01/21/2014, 3:53 PM   agitated during case [place]  Kristen J. Titus Mould, MD, [name]: [facility]

## 2014-01-21 NOTE — ED Provider Notes (Signed)
CSN: [id number]     Arrival date & time 01/21/14  1048 History   First MD Initiated Contact with Patient 01/21/14 1110     Chief Complaint  Patient presents with  . Fever  . Shortness of Breath   (Consider location/radiation/quality/duration/timing/severity/associated sxs/prior Treatment) HPI Comments: Level 5 caveat due to acuity of condition.   Patient is a 73 y.o. female presenting with fever and shortness of breath. The history is provided by the patient.  Fever Temp source:  Subjective Severity:  Moderate Onset quality:  Sudden Timing:  Constant Progression:  Unchanged Chronicity:  New Relieved by:  Nothing Worsened by:  Nothing tried Ineffective treatments:  None tried Associated symptoms: cough   Associated symptoms: no chest pain, no congestion, no diarrhea, no dysuria, no headaches, no nausea and no vomiting   Shortness of Breath Associated symptoms: cough and fever   Associated symptoms: no abdominal pain, no chest pain, no headaches, no neck pain and no vomiting     Past Medical History  Diagnosis Date  . Anxiety   . Arthritis   . COPD (chronic obstructive pulmonary disease)   . GERD (gastroesophageal reflux disease)   . Hyperlipidemia   . Osteoporosis   . DVT (deep venous thrombosis)   . PE (pulmonary thromboembolism)   . Hypertension      PT DENIES....ON NO  MEDS  . Diabetes mellitus   . Cancer 1969    cervical  . Pneumonia     h/o  . Bronchitis     h/o  . Blood transfusion   . Anemia   . Bipolar affect, depressed    Past Surgical History  Procedure Laterality Date  . Vena cava filter placement    . 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 Eye surgery      CATARACTS  . Achilles tendon lengthening  11/18/11    and repair w/posterior tibial tendon lengthening; right  foot  . Cholecystectomy    . Abdominal hysterectomy  1969    "womb taken out for cervical cancer"  . Cataract extraction, bilateral  ~ 2008  . Tonsillectomy      "as a child"   . Achilles tendon surgery  11/18/2011    Procedure: ACHILLES TENDON REPAIR;  Surgeon: Wylene Simmer, MD;  Location: [facility];  Service: Orthopedics;  Laterality: Right;  Right Posterior Tibial Tendon Lenghtening and Tendon Achilles Lenghtening    No family history on file. History  Substance Use Topics  . Smoking status: Former Smoker -- 1.00 packs/day    Types: Cigarettes    Quit date: 11/14/1986  . Smokeless tobacco: Never Used  . Alcohol Use: No   OB History   Grav Para Term Preterm Abortions TAB SAB Ect Mult Living                 Review of Systems  Constitutional: Positive for fever. Negative for fatigue.  HENT: Negative for congestion and drooling.   Eyes: Negative for pain.  Respiratory: Positive for cough and shortness of breath.   Cardiovascular: Negative for chest pain.  Gastrointestinal: Negative for nausea, vomiting, abdominal pain and diarrhea.  Genitourinary: Negative for dysuria and hematuria.  Musculoskeletal: Negative for back pain, gait problem and neck pain.  Skin: Negative for color change.  Neurological: Negative for dizziness and headaches.  Hematological: Negative for adenopathy.  Psychiatric/Behavioral: Negative for behavioral problems.  All other systems reviewed and are negative.    Allergies  Benzodiazepines; Morphine  and related; Olanzapine; and Sulfamethoxazole-trimethoprim  Home Medications   Current Outpatient Rx  Name  Route  Sig  Dispense  Refill  . metFORMIN (GLUCOPHAGE) 1000 MG tablet   Oral   Take 1,000 mg by mouth daily before supper.         . potassium chloride (K-DUR,KLOR-CON) 10 MEQ tablet   Oral   Take 5 mEq by mouth every evening.          Marland Kitchen QUEtiapine (SEROQUEL) 200 MG tablet   Oral   Take 200 mg by mouth at bedtime.         . simvastatin (ZOCOR) 10 MG tablet   Oral   Take 10 mg by mouth at bedtime.         . traZODone (DESYREL) 100 MG tablet   Oral   Take 100 mg by mouth at bedtime.         Marland Kitchen venlafaxine  XR (EFFEXOR-XR) 150 MG 24 hr capsule   Oral   Take 150 mg by mouth every morning.          Marland Kitchen acetaminophen (TYLENOL) 325 MG tablet   Oral   Take 650 mg by mouth every 4 (four) hours as needed for mild pain.         Marland Kitchen albuterol (PROVENTIL) (2.5 MG/3ML) 0.083% nebulizer solution   Nebulization   Take 3 mLs (2.5 mg total) by nebulization every 2 (two) hours as needed for wheezing or shortness of breath.   75 mL   12   . ALPRAZolam (XANAX) 1 MG tablet      Take one tablet by mouth four times daily   120 tablet   5   . antiseptic oral rinse (BIOTENE) LIQD   Mouth Rinse   15 mLs by Mouth Rinse route every 4 (four) hours as needed for dry mouth.         . Artificial Tear Ointment (REFRESH LACRI-LUBE) OINT   Both Eyes   Place 1 application into both eyes at bedtime.         Marland Kitchen aspirin 81 MG chewable tablet   Oral   Chew 81 mg by mouth daily.         . bisacodyl (DULCOLAX) 10 MG suppository   Rectal   Place 10 mg rectally 3 (three) times a week. On Monday, Wednesday, and Friday         . Calcium & Magnesium Carbonates (MYLANTA PO)   Oral   Take 30 mLs by mouth every 4 (four) hours as needed (indigestion).         . ciprofloxacin (CIPRO) 500 MG tablet   Oral   Take 1 tablet (500 mg total) by mouth 2 (two) times daily.         . feeding supplement, GLUCERNA SHAKE, (GLUCERNA SHAKE) LIQD   Oral   Take 237 mLs by mouth 3 (three) times daily between meals.      0   . fish oil-omega-3 fatty acids 1000 MG capsule   Oral   Take 1 g by mouth 2 (two) times daily.          . Fluticasone-Salmeterol (ADVAIR DISKUS) 500-50 MCG/DOSE AEPB   Inhalation   Inhale 1 puff into the lungs 2 (two) times daily.   60 each   11   . gabapentin (NEURONTIN) 300 MG capsule   Oral   Take 3 capsules (900 mg total) by mouth 3 (three) times daily.         Marland Kitchen  HYDROcodone-acetaminophen (NORCO/VICODIN) 5-325 MG per tablet   Oral   Take 2 tablets by mouth every 8 (eight) hours.    15 tablet   0   . insulin aspart (NOVOLOG) 100 UNIT/ML FlexPen   Subcutaneous   Inject 3-5 Units into the skin 3 (three) times daily before meals. Give 3 units before meals with an additional 5 units for CBG> or < 150         . insulin glargine (LANTUS) 100 UNIT/ML injection   Subcutaneous   Inject 0.22 mLs (22 Units total) into the skin daily.   10 mL   11   . levalbuterol (XOPENEX HFA) 45 MCG/ACT inhaler   Inhalation   Inhale 2 puffs into the lungs every 4 (four) hours as needed for wheezing.         . levalbuterol (XOPENEX) 1.25 MG/0.5ML nebulizer solution   Nebulization   Take 1.25 mg by nebulization 4 (four) times daily.   1 each   12   . Magnesium Hydroxide (MILK OF MAGNESIA PO)   Oral   Take 30 mLs by mouth See admin instructions. If no BM in 3 days, give 47ml in a 24 hours PRN.         . metoprolol tartrate (LOPRESSOR) 25 MG tablet   Oral   Take 1 tablet (25 mg total) by mouth 2 (two) times daily.         Marland Kitchen nystatin (MYCOSTATIN) 100000 UNIT/ML suspension   Oral   Take 5 mLs (500,000 Units total) by mouth 4 (four) times daily.   60 mL   0   . omeprazole (PRILOSEC OTC) 20 MG tablet   Oral   Take 20 mg by mouth 2 (two) times daily.         . OxyCODONE (OXYCONTIN) 10 mg T12A 12 hr tablet      Take one tablet by mouth every 12 hours. Do not crush   60 tablet   0   . polyethylene glycol (MIRALAX / GLYCOLAX) packet   Oral   Take 17 g by mouth daily.           . promethazine (PHENERGAN) 25 MG tablet   Oral   Take 25 mg by mouth every 6 (six) hours as needed. nausea         . sodium phosphate (FLEET) enema   Rectal   Place 1 enema rectally daily as needed (constipation). follow package directions         . tiotropium (SPIRIVA) 18 MCG inhalation capsule   Inhalation   Place 1 capsule (18 mcg total) into inhaler and inhale daily.   30 capsule   11   . torsemide (DEMADEX) 20 MG tablet   Oral   Take 40 mg by mouth every morning.          . traZODone (DESYREL) 50 MG tablet   Oral   Take 25 mg by mouth daily as needed (anxiety).           BP 87/69  Pulse 138  Temp(Src) 104.2 F (40.1 C) (Rectal)  Resp 24  Ht 5\' 2"  (1.575 m)  Wt 180 lb (81.647 kg)  BMI 32.91 kg/m2  SpO2 100% Physical Exam  Nursing note and vitals reviewed. Constitutional: She is oriented to person, place, and time. She appears well-developed and well-nourished.  HENT:  Head: Normocephalic.  Mouth/Throat: Oropharynx is clear and moist. No oropharyngeal exudate.  Eyes: Conjunctivae and EOM are normal. Pupils are equal, round, and  reactive to light.  Neck: Normal range of motion. Neck supple.  Cardiovascular: Regular rhythm, normal heart sounds and intact distal pulses.  Exam reveals no gallop and no friction rub.   No murmur heard. HR 130s  Pulmonary/Chest: Effort normal. No respiratory distress. She has wheezes (mild wheezing heard in bilateral lung bases ).  Abdominal: Soft. Bowel sounds are normal. There is tenderness (nonspecific nonfocal mild tenderness to palpation of abdomen). There is no rebound and no guarding.  Musculoskeletal: Normal range of motion. She exhibits no edema and no tenderness.  Neurological: She is alert and oriented to person, place, and time.  Mild confusion. Will only answer some questions.   Skin: Skin is warm and dry.  Psychiatric: She has a normal mood and affect. Her behavior is normal.    ED Course  Procedures (including critical care time) Labs Review Labs Reviewed  CBC WITH DIFFERENTIAL - Abnormal; Notable for the following:    WBC 13.7 (*)    RBC 3.80 (*)    Hemoglobin 11.0 (*)    HCT 35.1 (*)    RDW 15.7 (*)    Neutrophils Relative % 86 (*)    Lymphocytes Relative 7 (*)    Neutro Abs 11.8 (*)    All other components within normal limits  COMPREHENSIVE METABOLIC PANEL - Abnormal; Notable for the following:    Glucose, Bld 239 (*)    Albumin 3.2 (*)    Total Bilirubin 0.2 (*)    All other  components within normal limits  URINALYSIS, ROUTINE W REFLEX MICROSCOPIC - Abnormal; Notable for the following:    APPearance CLOUDY (*)    Hgb urine dipstick MODERATE (*)    Nitrite POSITIVE (*)    Leukocytes, UA LARGE (*)    All other components within normal limits  BLOOD GAS, ARTERIAL - Abnormal; Notable for the following:    pH, Arterial 7.344 (*)    pCO2 arterial 54.9 (*)    pO2, Arterial 67.3 (*)    Bicarbonate 29.0 (*)    Acid-Base Excess 3.0 (*)    All other components within normal limits  URINE MICROSCOPIC-ADD ON - Abnormal; Notable for the following:    Bacteria, UA MANY (*)    Casts HYALINE CASTS (*)    All other components within normal limits  CG4 I-STAT (LACTIC ACID) - Abnormal; Notable for the following:    Lactic Acid, Venous 5.00 (*)    All other components within normal limits  CULTURE, BLOOD (ROUTINE X 2)  CULTURE, BLOOD (ROUTINE X 2)  URINE CULTURE  CULTURE, EXPECTORATED SPUTUM-ASSESSMENT  LIPASE, BLOOD  PROTIME-INR  LACTIC ACID, PLASMA  POCT I-STAT TROPONIN I   Imaging Review Dg Chest Port 1 View  01/21/2014   CLINICAL DATA:  Fever with shortness of breath.  EXAM: PORTABLE CHEST - 1 VIEW  COMPARISON:  CT ANGIO CHEST W/CM &/OR WO/CM dated 12/31/2013; DG CHEST 1V PORT dated 12/30/2013  FINDINGS: 1206 hr. Two views were obtained. There are lower lung volumes with stable patchy bibasilar pulmonary opacities based on the second view. No progressive airspace disease or significant pleural effusion is identified. The heart size and mediastinal contours are stable. Deformity of the proximal right humerus appears stable.  IMPRESSION: Stable patchy bibasilar airspace opacities compared with recent prior studies. No new findings demonstrated.   Electronically Signed   By: Camie Patience M.D.   On: 01/21/2014 12:37    EKG Interpretation    Date/Time:  Monday January 21 2014 11:02:24 EST Ventricular  Rate:  132 PR Interval:  155 QRS Duration: 82 QT Interval:  304 QTC  Calculation: 450 R Axis:   45 Text Interpretation:  Sinus tachycardia Low voltage, precordial leads Borderline T wave abnormalities No significant change since last tracing Confirmed by Ayaan Ringle  MD, Jenisse Vullo ([id number]) on 01/21/2014 12:45:13 PM           CRITICAL CARE Performed by: Pamella Pert, S Total critical care time: 30 min Critical care time was exclusive of separately billable procedures and treating other patients. Critical care was necessary to treat or prevent imminent or life-threatening deterioration. Critical care was time spent personally by me on the following activities: development of treatment plan with patient and/or surrogate as well as nursing, discussions with consultants, evaluation of patient's response to treatment, examination of patient, obtaining history from patient or surrogate, ordering and performing treatments and interventions, ordering and review of laboratory studies, ordering and review of radiographic studies, pulse oximetry and re-evaluation of patient's condition.   MDM   1. Septic shock   2. Elevated lactic acid level   3. Sepsis secondary to UTI   4. Acute encephalopathy   5. 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 The patient is presenting from [facility] where EMS is reporting that the patient was found febrile and short of breath this morning. On exam she is alert and oriented x3, but sob and only answering some questions. She is a poor historian w/ some mild confusion. She states she has had a cough. She is febrile, tachycardic, hypotensive. She was seen approximately one month ago for urosepsis with septic shock. Will initiate sepsis protocol and treat empirically. Will get labs, IV fluids, breathing treatment, and and antibiotics.   12:31 PM VS improving w/ IVF and breathing tx. Discussed w/ pharmacy who is  recommending Vanc and primaxin d/t recent urine culture results and drug resistance to cefepime w/ possible resistance to previous therapy.   BP decreasing now. Will give 3rd L IVF bolus. Discussed case w/ CC team who will admit.   The patient will be admitted to the ICU under CC. Any medications given in the ED during this visit are listed below:  Medications  0.9 %  sodium chloride infusion (0 mLs Intravenous Stopped 01/21/14 1349)    Followed by  0.9 %  sodium chloride infusion (0 mLs Intravenous Stopped 01/21/14 1349)    Followed by  0.9 %  sodium chloride infusion (1,000 mLs Intravenous New Bag/Given 01/21/14 1223)  vancomycin (VANCOCIN) IVPB 1000 mg/200 mL premix (not administered)  imipenem-cilastatin (PRIMAXIN) 500 mg in sodium chloride 0.9 % 100 mL IVPB (not administered)  albuterol (PROVENTIL) (2.5 MG/3ML) 0.083% nebulizer solution 2.5 mg (not administered)  mometasone-formoterol (DULERA) 200-5 MCG/ACT inhaler 2 puff (not administered)  tiotropium (SPIRIVA) inhalation capsule 18 mcg (not administered)  insulin aspart (novoLOG) injection 0-9 Units (not administered)  fentaNYL (SUBLIMAZE) injection 25 mcg (25 mcg Intravenous Not Given 01/21/14 1600)  ceFEPIme (MAXIPIME) 2 g in dextrose 5 % 50 mL IVPB (0 g Intravenous Stopped 01/21/14 1347)  vancomycin (VANCOCIN) IVPB 1000 mg/200 mL premix (0 mg Intravenous Stopped 01/21/14 1347)  albuterol (PROVENTIL,VENTOLIN) solution continuous neb (10 mg/hr Nebulization Given 01/21/14 1153)  acetaminophen (TYLENOL) tablet 650 mg (650 mg Oral Given 01/21/14 1226)  imipenem-cilastatin (PRIMAXIN) 500 mg in sodium chloride 0.9 % 100 mL IVPB (0 mg Intravenous Stopped  01/21/14 1347)  sodium chloride 0.9 % bolus 1,000 mL (1,000 mLs Intravenous New Bag/Given 01/21/14 1346)  fentaNYL (SUBLIMAZE) 0.05 MG/ML injection (25 mcg  Given 01/21/14 1525)  fentaNYL (SUBLIMAZE) injection 25 mcg (25 mcg Intravenous Given 01/21/14 1529)  fentaNYL (SUBLIMAZE) injection 25 mcg (25 mcg  Intravenous Given 01/21/14 1531)      Blanchard Kelch, MD 01/22/14 1233

## 2014-01-21 NOTE — ED Notes (Signed)
Portable CXR at bedside.

## 2014-01-21 NOTE — Progress Notes (Signed)
PROGRESS NOTE  DATE: 01/21/2014   FACILITY: [facility]  LEVEL OF CARE: SNF (31)  ALLERGIES:  Allergies  Allergen Reactions  . Benzodiazepines Other (See Comments)    Mood dysregulation  (on MAR)  . Morphine And Related Other (See Comments)    Pain dysregulation Mood disruption  . Olanzapine     Hallucinations and disorientation  . Sulfamethoxazole-Trimethoprim Hives    CHIEF COMPLAINT:  Manage acute respiratory failure  HISTORY OF PRESENT ILLNESS: Staff reports that patient is increasingly agitated, anxious and is in severe respiratory distress. Symptoms onset is this morning. They cannot identify precipitating or alleviating factors. Symptoms have been worsening. Patient is a poor historian due to agitation and anxiety. She has a history of COPD.  PAST MEDICAL HISTORY :  Past Medical History  Diagnosis Date  . Anxiety   . Arthritis   . COPD (chronic obstructive pulmonary disease)   . GERD (gastroesophageal reflux disease)   . Hyperlipidemia   . Osteoporosis   . DVT (deep venous thrombosis)   . PE (pulmonary thromboembolism)   . Hypertension      PT DENIES....ON NO  MEDS  . Diabetes mellitus   . Cancer 1969    cervical  . Pneumonia     h/o  . Bronchitis     h/o  . Blood transfusion   . Anemia   . Bipolar affect, depressed     PAST SURGICAL HISTORY: Past Surgical History  Procedure Laterality Date  . Vena cava filter placement    . 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 Eye surgery      CATARACTS  . Achilles tendon lengthening  11/18/11    and repair w/posterior tibial tendon lengthening; right  foot  . Cholecystectomy    . Abdominal hysterectomy  1969    "womb taken out for cervical cancer"  . Cataract extraction, bilateral  ~ 2008  . Tonsillectomy      "as a child"  . Achilles tendon surgery  11/18/2011    Procedure: ACHILLES TENDON REPAIR;  Surgeon: Wylene Simmer, MD;  Location: [facility];  Service:  Orthopedics;  Laterality: Right;  Right Posterior Tibial Tendon Lenghtening and Tendon Achilles Lenghtening     SOCIAL HISTORY:  reports that she quit smoking about 27 years ago. Her smoking use included Cigarettes. She smoked 1.00 pack per day. She has never used smokeless tobacco. She reports that she does not drink alcohol or use illicit drugs.  FAMILY HISTORY: None  CURRENT MEDICATIONS: Reviewed per [facility]  REVIEW OF SYSTEMS:  Unobtainable due to agitation and anxiety  PHYSICAL EXAMINATION  VS:  T 102.1       P 154       RR 36      BP 140/76       POX%  77 in room air air which increased to 86% on nonrebreather  mask  GENERAL: Severe acute distress, obese body habitus EYES: conjunctivae normal, sclerae normal, normal eye lids MOUTH/THROAT: Unable to assess on nonrebreather mask NECK: supple, trachea midline, no neck masses, no thyroid tenderness, no thyromegaly LYMPHATICS: no LAN in the neck, no supraclavicular LAN RESPIRATORY: Tachypnic and in severe acute respiratory distress CARDIAC: Tachycardia , +2 bilateral lower extremity edema GI:  ABDOMEN: abdomen soft, no BS, no masses, no tenderness  LIVER/SPLEEN: no hepatomegaly, no splenomegaly MUSCULOSKELETAL: EXTREMITIES: Unable to assess PSYCHIATRIC: the patient is alert & oriented to  person, decreased affect & agitated, restless and anxious  LABS/RADIOLOGY:  Labs reviewed: Basic Metabolic Panel:  Recent Labs  12/28/13 0651 12/31/13 0902 01/21/14 1139  NA 143 140 141  K 3.5* 5.2 5.0  CL 104 96 98  CO2 30 32 28  GLUCOSE 209* 264* 239*  BUN 3* 8 9  CREATININE 0.46* 0.46* 0.52  CALCIUM 8.3* 9.2 8.8   Liver Function Tests:  Recent Labs  12/25/13 1424 01/21/14 1139  AST 12 30  ALT 10 14  ALKPHOS 58 57  BILITOT 0.4 0.2*  PROT 7.1 7.1  ALBUMIN 3.0* 3.2*    Recent Labs  01/21/14 1139  LIPASE 14   CBC:  Recent Labs  12/25/13 1424 12/26/13 0500 12/28/13 0651 01/21/14 1139  WBC 13.6* 9.3 4.7 13.7*    NEUTROABS 10.3*  --   --  11.8*  HGB 10.6* 9.7* 9.9* 11.0*  HCT 33.6* 31.8* 32.9* 35.1*  MCV 90.6 92.2 93.2 92.4  PLT 151 139* 172 PLATELET CLUMPS NOTED ON SMEAR, COUNT APPEARS ADEQUATE   Cardiac Enzymes:  Recent Labs  12/27/13 0050 12/27/13 0552 12/27/13 1250  TROPONINI <0.30 <0.30 <0.30   CBG:  Recent Labs  01/01/14 1239 01/21/14 1635 01/21/14 1759  GLUCAP 260* 189* 231*     ASSESSMENT/PLAN:  Severe acute respiratory distress Fever  Hypoxemia Tachycardia new onset significant problems. Patient is unstable. Will send to the emergency room for immediate stabilization and management. She will need BiPAP, chest x-ray, EKG, CBC with differential, CMP, IV fluids and IV antibiotics, cardiac monitor.  CPT CODE: 22979

## 2014-01-21 NOTE — ED Notes (Addendum)
Per EMS pt from [facility] and staff went in to check on her and found her with high fever low O2 sats. Pt on Non Re breather and is 100%. Hr 142, BP 124/90. Pt is anxious but EMS reports she is calmer since O2 sats have come up. 5mg  of albuterol  given in route by EMS

## 2014-01-21 NOTE — Progress Notes (Signed)
Spoke with daughter, [name] who stated that pt [name], uses Xanax 1mg  po four times daily and Oxycontin 10mg  po two times daily and has for two and one-half years.  She was concerned about her mother withdrawing from meds. Spoke with elink who said they are keeping a close watch on her and will give her meds PRN based on the patient's status until am.

## 2014-01-21 NOTE — Progress Notes (Signed)
Earlier this evening, pt became dyspneic at rest, RR 35, lung sounds diminished throughout, pt very anxious yelling "help me" and c/o SOB.  Elink notified with orders received.  Pt now resting on Bipap, febrile and BP stable.

## 2014-01-21 NOTE — Progress Notes (Signed)
Tremors +, ? baseline Febrile +  Plan Tylenol 1gm po x 1  Dr. Brand Males, M.D., [facility].C.P Pulmonary and Critical Care Medicine Staff Physician [facility] Pager: [phone], If no answer or between  15:00h - 7:00h: call [phone]  01/21/2014 9:22 PM

## 2014-01-21 NOTE — Progress Notes (Addendum)
Now dyspneic and anxiou per RN. Wheezig +  Plan Stat abg, cxr and neb  fent and versed reeassess    ---------------------> start bipap due to resp acidpsis on abg                   --------------------> cxr looks wet, bp ok, Rx lasix 20mg  IV x 1     ->>>>>>>>>>>>>>check resp virus panel PCR         Dr. Brand Males, M.D., [facility].C.P Pulmonary and Critical Care Medicine Staff Physician [place] [facility] Pager: [phone], If no answer or between  15:00h - 7:00h: call [phone]  01/21/2014 10:02 PM

## 2014-01-21 NOTE — H&P (Signed)
Name: [name] MRN: [id number] DOB: [birth year]    ADMISSION DATE:  01/21/2014 CONSULTATION DATE:  01/21/14  REFERRING MD :  Gabriel Cirri PRIMARY SERVICE: PCCM  CHIEF COMPLAINT:  Fever, hypoxia  BRIEF PATIENT DESCRIPTION: 73 yo caucasian female resident of [facility] found by staff with fever and hypoxia. Taken to ED on NRB 100% with improvement. PCCM asked to see patient for respiratory needs and hypotension refractory to IVF. Recent admission to hospital 12/25/13 for ESBL E. Coli>>Urosepsis.  SIGNIFICANT EVENTS / STUDIES:  12/31/13 - CT angio chest  - Emphysema. Right consolidation. 2 nodules in right apex (f/u CT 4-6 weeks recommended) 2/2 >> Admitted to PCCM, LIJ placed, shock  LINES / TUBES: PIV 2/2>>> LIJ 2/2>>> Foley 2/2>>>  CULTURES: Blood 2/2>>> Urine 2/2>>> (recent ESBL E. Coli isolated 12/31/13)  ANTIBIOTICS: Vancomycin 2/2>>> Primaxin 2/2>>>  HISTORY OF PRESENT ILLNESS:  73 yo caucasian female resident of [facility] with PMH cervical cancer, DM, HTN, anxiety, COPD, bipolar and recent admission 12/25/13-01/01/14 to hospital with acute encephalopathy due to urosepsis due to E.Coli ESBL. Treated with cefepime narrowed to cipro for 10 days total (ESBL resistant to both). Records report that staff went to check on patient and she was found to be febrile with low O2 sats. Patient was put on non-rebreather @100 % with some anxiety but with some resolution after O2 sats improved. Brought to [facility] ED for workup of possible sepsis.  PAST MEDICAL HISTORY :  Past Medical History  Diagnosis Date  . Anxiety   . Arthritis   . COPD (chronic obstructive pulmonary disease)   . GERD (gastroesophageal reflux disease)   . Hyperlipidemia   . Osteoporosis   . DVT (deep venous thrombosis)   . PE (pulmonary thromboembolism)   . Hypertension      PT DENIES....ON NO  MEDS  . Diabetes mellitus   . Cancer 1969    cervical  . Pneumonia     h/o  . Bronchitis     h/o  . Blood  transfusion   . Anemia   . Bipolar affect, depressed    Past Surgical History  Procedure Laterality Date  . Vena cava filter placement    . 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 Eye surgery      CATARACTS  . Achilles tendon lengthening  11/18/11    and repair w/posterior tibial tendon lengthening; right  foot  . Cholecystectomy    . Abdominal hysterectomy  1969    "womb taken out for cervical cancer"  . Cataract extraction, bilateral  ~ 2008  . Tonsillectomy      "as a child"  . Achilles tendon surgery  11/18/2011    Procedure: ACHILLES TENDON REPAIR;  Surgeon: Wylene Simmer, MD;  Location: [facility];  Service: Orthopedics;  Laterality: Right;  Right Posterior Tibial Tendon Lenghtening and Tendon Achilles Lenghtening    Prior to Admission medications   Medication Sig Start Date End Date Taking? Authorizing Provider  acetaminophen (TYLENOL) 325 MG tablet Take 650 mg by mouth every 4 (four) hours as needed for mild pain.   Yes Historical Provider, MD  albuterol (PROVENTIL) (2.5 MG/3ML) 0.083% nebulizer solution Take 3 mLs (2.5 mg total) by nebulization every 2 (two) hours as needed for wheezing or shortness of breath. 01/01/14  Yes Geradine Girt, DO  ALPRAZolam Duanne Moron) 1 MG tablet Take one tablet by mouth four times daily 01/15/14  Yes Blanchie Serve, MD  antiseptic oral rinse (BIOTENE) LIQD 15 mLs by Mouth Rinse route every 4 (four) hours as needed for dry mouth.   Yes Historical Provider, MD  Artificial Tear Ointment (REFRESH LACRI-LUBE) OINT Place 1 application into both eyes at bedtime.   Yes Historical Provider, MD  aspirin 81 MG chewable tablet Chew 81 mg by mouth daily.   Yes Historical Provider, MD  bisacodyl (DULCOLAX) 10 MG suppository Place 10 mg rectally 3 (three) times a week. On Monday, Wednesday, and Friday   Yes Historical Provider, MD  Calcium & Magnesium Carbonates (MYLANTA PO) Take 30 mLs by mouth every 4 (four) hours as needed (indigestion).   Yes Historical Provider,  MD  feeding supplement, GLUCERNA SHAKE, (GLUCERNA SHAKE) LIQD Take 237 mLs by mouth 3 (three) times daily between meals. 01/01/14  Yes Geradine Girt, DO  fish oil-omega-3 fatty acids 1000 MG capsule Take 1 g by mouth 2 (two) times daily.    Yes Historical Provider, MD  Fluticasone-Salmeterol (ADVAIR DISKUS) 500-50 MCG/DOSE AEPB Inhale 1 puff into the lungs 2 (two) times daily. 08/25/11  Yes Judithann Sheen, MD  gabapentin (NEURONTIN) 300 MG capsule Take 3 capsules (900 mg total) by mouth 3 (three) times daily. 01/01/14  Yes Geradine Girt, DO  HYDROcodone-acetaminophen (NORCO/VICODIN) 5-325 MG per tablet Take 2 tablets by mouth every 8 (eight) hours. 01/01/14  Yes Geradine Girt, DO  insulin aspart (NOVOLOG) 100 UNIT/ML FlexPen Inject 3-5 Units into the skin 3 (three) times daily before meals. Give 3 units before meals with an additional 5 units for CBG> or < 150   Yes Historical Provider, MD  insulin glargine (LANTUS) 100 UNIT/ML injection Inject 0.22 mLs (22 Units total) into the skin daily. 01/01/14  Yes Geradine Girt, DO  levalbuterol (XOPENEX HFA) 45 MCG/ACT inhaler Inhale 2 puffs into the lungs every 4 (four) hours as needed for wheezing. 02/21/12  Yes Judithann Sheen, MD  levalbuterol (XOPENEX) 1.25 MG/0.5ML nebulizer solution Take 1.25 mg by nebulization 4 (four) times daily. 01/01/14  Yes Geradine Girt, DO  Magnesium Hydroxide (MILK OF MAGNESIA PO) Take 30 mLs by mouth See admin instructions. If no BM in 3 days, give 74ml in a 24 hours PRN.   Yes Historical Provider, MD  metFORMIN (GLUCOPHAGE) 1000 MG tablet Take 1,000 mg by mouth daily before supper.   Yes Historical Provider, MD  metoprolol tartrate (LOPRESSOR) 25 MG tablet Take 1 tablet (25 mg total) by mouth 2 (two) times daily. 01/01/14  Yes Geradine Girt, DO  nystatin (MYCOSTATIN) 100000 UNIT/ML suspension Take 5 mLs (500,000 Units total) by mouth 4 (four) times daily. 01/01/14  Yes Geradine Girt, DO  omeprazole (PRILOSEC OTC) 20 MG tablet  Take 20 mg by mouth 2 (two) times daily.   Yes Historical Provider, MD  OxyCODONE (OXYCONTIN) 10 mg T12A 12 hr tablet Take one tablet by mouth every 12 hours. Do not crush 01/02/14  Yes Pricilla Larsson, NP  polyethylene glycol (MIRALAX / GLYCOLAX) packet Take 17 g by mouth daily.     Yes Historical Provider, MD  potassium chloride (K-DUR,KLOR-CON) 10 MEQ tablet Take 5 mEq by mouth every evening.  08/25/11  Yes Judithann Sheen, MD  promethazine (PHENERGAN) 25 MG tablet Take 25 mg by mouth every 6 (six) hours as needed. nausea   Yes Historical Provider, MD  QUEtiapine (SEROQUEL) 200 MG tablet Take 200 mg by mouth at bedtime.   Yes Historical Provider, MD  simvastatin (ZOCOR) 10 MG  tablet Take 10 mg by mouth at bedtime.   Yes Historical Provider, MD  sodium phosphate (FLEET) enema Place 1 enema rectally daily as needed (constipation). follow package directions   Yes Historical Provider, MD  tiotropium (SPIRIVA) 18 MCG inhalation capsule Place 1 capsule (18 mcg total) into inhaler and inhale daily. 08/25/11  Yes Judithann Sheen, MD  torsemide (DEMADEX) 20 MG tablet Take 40 mg by mouth every morning.   Yes Historical Provider, MD  traZODone (DESYREL) 100 MG tablet Take 100 mg by mouth at bedtime.   Yes Historical Provider, MD  traZODone (DESYREL) 50 MG tablet Take 25 mg by mouth daily as needed (anxiety).  01/31/12  Yes Waldemar Dickens, MD  venlafaxine XR (EFFEXOR-XR) 150 MG 24 hr capsule Take 150 mg by mouth every morning.  01/31/12  Yes Waldemar Dickens, MD   Allergies  Allergen Reactions  . Benzodiazepines Other (See Comments)    Mood dysregulation  (on MAR)  . Morphine And Related Other (See Comments)    Pain dysregulation Mood disruption  . Olanzapine     Hallucinations and disorientation  . Sulfamethoxazole-Trimethoprim Hives    FAMILY HISTORY:  History reviewed. No pertinent family history. SOCIAL HISTORY:  reports that she quit smoking about 27 years ago. Her smoking use included  Cigarettes. She smoked 1.00 pack per day. She has never used smokeless tobacco. She reports that she does not drink alcohol or use illicit drugs.  REVIEW OF SYSTEMS:  Unable to assess due to encephalopathic  SUBJECTIVE:   VITAL SIGNS: Temp:  [99.2 F (37.3 C)-104.2 F (40.1 C)] 99.6 F (37.6 C) (02/02 1340) Pulse Rate:  [113-138] 113 (02/02 1420) Resp:  [18-26] 22 (02/02 1420) BP: (73-121)/(32-69) 92/44 mmHg (02/02 1420) SpO2:  [94 %-100 %] 100 % (02/02 1420) Weight:  [81.647 kg (180 lb)] 81.647 kg (180 lb) (02/02 1130) HEMODYNAMICS:   VENTILATOR SETTINGS:   INTAKE / OUTPUT: Intake/Output   None     PHYSICAL EXAMINATION:  General appearance - in mild to moderate distress, ill-appearing Mental status - not oriented to person, place, time, or setting Eyes - PERRL Mouth - mucous membranes moist, pharynx normal without lesions Neck - supple, no significant adenopathy, LIJ in place Chest - distant breath sounds bilaterally Heart - normal rate, regular rhythm, normal S1, S2, no murmurs, rubs, clicks or gallops Abdomen - soft, nontender, nondistended, no masses or organomegaly Neurological - not oriented, encephalopathic "please help me" Musculoskeletal - no joint tenderness, deformity or swelling Extremities - peripheral pulses normal, no pedal edema, no clubbing or cyanosis Skin - normal coloration and turgor, no rashes, no suspicious skin lesions noted   LABS:  CBC  Recent Labs Lab 01/21/14 1139  WBC 13.7*  HGB 11.0*  HCT 35.1*  PLT PLATELET CLUMPS NOTED ON SMEAR, COUNT APPEARS ADEQUATE   Coag's  Recent Labs Lab 01/21/14 1139  INR 0.92   BMET  Recent Labs Lab 01/21/14 1139  NA 141  K 5.0  CL 98  CO2 28  BUN 9  CREATININE 0.52  GLUCOSE 239*   Electrolytes  Recent Labs Lab 01/21/14 1139  CALCIUM 8.8   Sepsis Markers  Recent Labs Lab 01/21/14 1207  LATICACIDVEN 5.00*   ABG  Recent Labs Lab 01/21/14 1138  PHART 7.344*  PCO2ART 54.9*    PO2ART 67.3*   Liver Enzymes  Recent Labs Lab 01/21/14 1139  AST 30  ALT 14  ALKPHOS 57  BILITOT 0.2*  ALBUMIN 3.2*   Cardiac Enzymes  No results found for this basename: TROPONINI, PROBNP,  in the last 168 hours Glucose No results found for this basename: GLUCAP,  in the last 168 hours  Imaging Dg Chest Port 1 View  01/21/2014   CLINICAL DATA:  Fever with shortness of breath.  EXAM: PORTABLE CHEST - 1 VIEW  COMPARISON:  CT ANGIO CHEST W/CM &/OR WO/CM dated 12/31/2013; DG CHEST 1V PORT dated 12/30/2013  FINDINGS: 1206 hr. Two views were obtained. There are lower lung volumes with stable patchy bibasilar pulmonary opacities based on the second view. No progressive airspace disease or significant pleural effusion is identified. The heart size and mediastinal contours are stable. Deformity of the proximal right humerus appears stable.  IMPRESSION: Stable patchy bibasilar airspace opacities compared with recent prior studies. No new findings demonstrated.   Electronically Signed   By: Camie Patience M.D.   On: 01/21/2014 12:37     CXR 2/215 - stable bibasilar opacities, patchy infiltrates?  ASSESSMENT / PLAN:  PULMONARY A: Respiratory distress COPD R/o HCAP early P:   Goal SpO2 >92% Continue home albuterol neb, advair, and spiriva pcxr in am   CARDIOVASCULAR A: Septic shock Hypotension (hx HTN) Tachycardia P:  S/p 2L NS bolus Continue NS @ 125, obtain cvp 1 L bolus, if no BP response st EGDT Levophed to map 60 Place CVL STAT Repeat lactic acid Hold home anti-hypertensives (lopressor) cortisol  RENAL A:   UTI AG metabolic acidosis, 15 Hx E. Coli urosepsis, ESBL - undertreated from last admission P:   Check lactic acid Monitor BMET Abx as below  GASTROINTESTINAL A:   Hx GERD SUP P:   Protonix Npo lft  HEMATOLOGIC A:  Anemia, stable (asymptomatic), dvt prevention P:  Follow CBC Sub  q heparin  INFECTIOUS A:  Suspected urosepsis, ESBL source- not  treated fully  Febrile (104.2) Leukocytosis P:   Blood, urine cultures Trend fever curve Vanc and STAT Primaxin per rx Ensure no old foley  ENDOCRINE A:   DMII (01/07/14 A1C = 8.1) Hyperglycemia R/o rel AI P:   Cont IVF CBG checks SSI - sensitive Dc metformin, lactic acid Lantus hold as npo Cortisol, stress roids possible  NEUROLOGIC A:  Acute encephalopathy Anxiety Bipolar Hx acute encephalopathy 2/2 urosepsis P:   Hold benzos, oxycodone, seroquel, trazodone, and venlafaxine fent Treat infection  TODAY'S SUMMARY: ESBL reoccurrence, STAT imi, vanc, hcap on top?, egdt, levo, cvl, cortisol  I have personally obtained a history, examined the patient, evaluated laboratory and imaging results, formulated the assessment and plan and placed orders. CRITICAL CARE: The patient is critically ill with multiple organ systems failure and requires high complexity decision making for assessment and support, frequent evaluation and titration of therapies, application of advanced monitoring technologies and extensive interpretation of multiple databases. Critical Care Time devoted to patient care services described in this note is 45 minutes.   Lavon Paganini. Titus Mould, MD, [place] Pgr: [facility]  Pulmonary and [place] Pager: [phone]  01/21/2014, 2:27 PM

## 2014-01-21 NOTE — Progress Notes (Signed)
On arrival camera exam from [facility]  BP now normal CVP 8 HR 113 Not itnubatee but  RASS +2 and delirious  - normal renal functiopn  - tons of CNS meds - xanax 4 times daily, norco 5/325 - 2 tab tid, neurontin 900mg  total per day, oxycontin 10mg  q12h, seroquel 200mg  qhs, trazadone 100mg  qhs, effecor-xr 150mg  qhs   Delrium can easily be withdrawal from these Can be opioid induced neurotoxicyt too  Plan Haldol 5mg  Iv x 1 Fentanyl 66mcg x 1 Versed 2mg  x 1  And reassess. Might need to start all oral meds   Dr. Brand Males, M.D., [facility].C.P Pulmonary and Critical Care Medicine Staff Physician [place] [facility] Pager: [phone], If no answer or between  15:00h - 7:00h: call [phone]  01/21/2014 6:01 PM

## 2014-01-21 NOTE — Progress Notes (Signed)
Inpatient Diabetes Program Recommendations  AACE/ADA: New Consensus Statement on Inpatient Glycemic Control (2013)  Target Ranges:  Prepandial:   less than 140 mg/dL      Peak postprandial:   less than 180 mg/dL (1-2 hours)      Critically ill patients:  140 - 180 mg/dL   Reason for Assessment: Hyperglycemi  Diabetes history: Type 2 DM Outpatient Diabetes medications: Lantus 22 units QD, metformin 1000 mg bid, Novolog 3 units tidwc with additional 5 units if CBG >150 mg/dL Current orders for Inpatient glycemic control: None  73 Y O F from a nursing home was admitted on 1/7 with sepsis of a urinary source.  Results for ARTISHA, CAPRI (MRN [id number]) as of 01/21/2014 14:38  Ref. Range 12/28/2013 06:51  Hemoglobin A1C Latest Range: <5.7 % 8.1 (H)   Recommendations: Add 1/2 Lantus home dose - 10 units QHS Add Novolog mod Q4 hours.  Will continue to follow. Thank you. Lorenda Peck, RD, LDN, CDE Inpatient Diabetes Coordinator [phone]

## 2014-01-21 NOTE — Progress Notes (Addendum)
ANTIBIOTIC CONSULT NOTE - INITIAL  Pharmacy Consult for Vancomycin, Primaxin Indication: sepsis, recent ESBL E.coli UTI  Allergies  Allergen Reactions  . Benzodiazepines Other (See Comments)    Mood dysregulation  (on MAR)  . Morphine And Related Other (See Comments)    Pain dysregulation Mood disruption  . Olanzapine     Hallucinations and disorientation  . Sulfamethoxazole-Trimethoprim Hives   Patient Measurements: Height: 5\' 2"  (157.5 cm) Weight: 180 lb (81.647 kg) IBW/kg (Calculated) : 50.1  Vital Signs: Temp: 104.2 F (40.1 C) (02/02 1105) Temp src: Rectal (02/02 1105) BP: 115/56 mmHg (02/02 1230) Pulse Rate: 124 (02/02 1230) Intake/Output from previous day:   Intake/Output from this shift:    Assessment: 45 yoF [facility]1/13/15 with E.coli urosepsis with septic shock.  Per notes from SNF on 1/23, pt was found with rash under breast and abdominal folds treated with nystatin cream. Pt presented to ED 2/2 with reported fever and SOB at SNF. Pt found febrile, tachycardic, hypotensive. Sepsis protocol started and empiric abx with Vanc, Cefepime. Of note, recent E.coli from urine culture was an ESBL producer and patient was treated with Cefepime inpatient, changed to Cipro at discharge (not susceptible). Per communication with Dr. Aline Brochure, change abx to Vanc and Primaxin for now.   2/2 >> Vanc >>  2/2 >> Cefepime x 1  2/2 >> Primaxin >>   Tmax: 104.2 WBCs: pending Renal: Scr pending  2/2 urine >> ordered 2/2 blood x 2 >> ordered   Goal of Therapy:  Vancomycin trough level 15-20 mcg/ml  Plan:   Awaiting BMET to result for Scr  Vancomycin 1g IV x 1   Primaxin 500 mg IV x 1   F/u Scr and redose  Vanessa Montevallo, PharmD, BCPS Pager: [phone] 12:40 PM Pharmacy #: [id number]   Addendum:  Scr 0.52, CG 64, N 73 (using Scr 0.8)  Plan: Continue abx with Vancomycin 1g IV q12h and Primaxin 500 mg IV q6h. Pharmacy will f/u  Vanessa Shelton, PharmD,  BCPS Pager: [phone] 1:41 PM Pharmacy #: [id number]

## 2014-01-21 NOTE — Progress Notes (Signed)
   CARE MANAGEMENT ED NOTE 01/21/2014  Patient:  [name], [name]   Account Number:  [id number]  Date Initiated:  01/21/2014  Documentation initiated by:  Jackelyn Poling  Subjective/Objective Assessment:   73 yr old female medicare pt [facility] Information sheet with pt reports pcp is Dr Linton Ham Dx Septic Shock to ICU sbp <100 HR in 100s Pt screaming out     Subjective/Objective Assessment Detail:     Action/Plan:   UR completed EPIC updated   Action/Plan Detail:   Anticipated DC Date:  01/24/2014     Status Recommendation to Physician:   Result of Recommendation:    Other ED Services  Consult [facility] [place]  Other  PCP issues  Outpatient Services - Pt will follow up    Choice offered to / List presented to:            Status of service:  Completed, signed off  ED Comments:   ED Comments Detail:

## 2014-01-21 NOTE — Progress Notes (Signed)
Patient much improved CNS status. RASS -2 with versed, fentanyl and haldol all IV. Did have transient hypotension as a result as well. Daughter had called RN about home po meds  Plan D/w RN - agree patient at risk for withdrawal without home meds but best current plan right now is prn IV equivalents with RN to call [facility] at times of problems.   Bedside MD to address patient CNS med regimen  Dr. Brand Males, M.D., [facility].C.P Pulmonary and Critical Care Medicine Staff Physician [place] [facility] Pager: [phone], If no answer or between  15:00h - 7:00h: call [phone]  01/21/2014 9:04 PM

## 2014-01-22 ENCOUNTER — Inpatient Hospital Stay (HOSPITAL_COMMUNITY): Payer: Medicare Other

## 2014-01-22 DIAGNOSIS — J9600 Acute respiratory failure, unspecified whether with hypoxia or hypercapnia: Secondary | ICD-10-CM

## 2014-01-22 LAB — GLUCOSE, CAPILLARY
GLUCOSE-CAPILLARY: 181 mg/dL — AB (ref 70–99)
GLUCOSE-CAPILLARY: 169 mg/dL — AB (ref 70–99)
Glucose-Capillary: 149 mg/dL — ABNORMAL HIGH (ref 70–99)
Glucose-Capillary: 164 mg/dL — ABNORMAL HIGH (ref 70–99)
Glucose-Capillary: 185 mg/dL — ABNORMAL HIGH (ref 70–99)
Glucose-Capillary: 189 mg/dL — ABNORMAL HIGH (ref 70–99)

## 2014-01-22 LAB — BLOOD GAS, ARTERIAL
ACID-BASE EXCESS: 3.8 mmol/L — AB (ref 0.0–2.0)
ACID-BASE EXCESS: 0.2 mmol/L (ref 0.0–2.0)
BICARBONATE: 26.6 meq/L — AB (ref 20.0–24.0)
Bicarbonate: 28.5 mEq/L — ABNORMAL HIGH (ref 20.0–24.0)
DELIVERY SYSTEMS: POSITIVE
DRAWN BY: 11249
Drawn by: 103701
FIO2: 0.4 %
FIO2: 1 %
LHR: 15 {breaths}/min
MECHVT: 400 mL
O2 Saturation: 96.3 %
O2 Saturation: 99.6 %
PEEP: 5 cmH2O
PO2 ART: 293 mmHg — AB (ref 80.0–100.0)
Patient temperature: 102.7
Patient temperature: 98.6
Pressure control: 8 cmH2O
RATE: 14 resp/min
TCO2: 26.4 mmol/L (ref 0–100)
TCO2: 24.9 mmol/L (ref 0–100)
pCO2 arterial: 51.5 mmHg — ABNORMAL HIGH (ref 35.0–45.0)
pCO2 arterial: 54.8 mmHg — ABNORMAL HIGH (ref 35.0–45.0)
pH, Arterial: 7.374 (ref 7.350–7.450)
pH, Arterial: 7.307 — ABNORMAL LOW (ref 7.350–7.450)
pO2, Arterial: 90.2 mmHg (ref 80.0–100.0)

## 2014-01-22 LAB — RESPIRATORY VIRUS PANEL
ADENOVIRUS: NOT DETECTED
INFLUENZA A H3: NOT DETECTED
Influenza A H1: NOT DETECTED
Influenza A: NOT DETECTED
Influenza B: NOT DETECTED
Metapneumovirus: NOT DETECTED
PARAINFLUENZA 1 A: NOT DETECTED
Parainfluenza 2: NOT DETECTED
Parainfluenza 3: NOT DETECTED
RESPIRATORY SYNCYTIAL VIRUS A: NOT DETECTED
RESPIRATORY SYNCYTIAL VIRUS B: NOT DETECTED
Rhinovirus: NOT DETECTED

## 2014-01-22 LAB — CBC
HEMATOCRIT: 31.6 % — AB (ref 36.0–46.0)
HEMOGLOBIN: 9.8 g/dL — AB (ref 12.0–15.0)
MCH: 28.7 pg (ref 26.0–34.0)
MCHC: 31 g/dL (ref 30.0–36.0)
MCV: 92.4 fL (ref 78.0–100.0)
Platelets: 129 10*3/uL — ABNORMAL LOW (ref 150–400)
RBC: 3.42 MIL/uL — ABNORMAL LOW (ref 3.87–5.11)
RDW: 16 % — ABNORMAL HIGH (ref 11.5–15.5)
WBC: 16.6 10*3/uL — AB (ref 4.0–10.5)

## 2014-01-22 LAB — CORTISOL: Cortisol, Plasma: 16.6 ug/dL

## 2014-01-22 LAB — PROCALCITONIN: PROCALCITONIN: 1.71 ng/mL

## 2014-01-22 LAB — INFLUENZA PANEL BY PCR (TYPE A & B)
H1N1FLUPCR: NOT DETECTED
Influenza A By PCR: NEGATIVE
Influenza B By PCR: NEGATIVE

## 2014-01-22 LAB — BASIC METABOLIC PANEL
BUN: 7 mg/dL (ref 6–23)
CHLORIDE: 103 meq/L (ref 96–112)
CO2: 29 mEq/L (ref 19–32)
Calcium: 7.5 mg/dL — ABNORMAL LOW (ref 8.4–10.5)
Creatinine, Ser: 0.52 mg/dL (ref 0.50–1.10)
Glucose, Bld: 200 mg/dL — ABNORMAL HIGH (ref 70–99)
POTASSIUM: 3.8 meq/L (ref 3.7–5.3)
SODIUM: 143 meq/L (ref 137–147)

## 2014-01-22 LAB — LACTIC ACID, PLASMA: LACTIC ACID, VENOUS: 1 mmol/L (ref 0.5–2.2)

## 2014-01-22 MED ORDER — SODIUM CHLORIDE 0.9 % IV SOLN
0.0000 ug/h | INTRAVENOUS | Status: DC
  Administered 2014-01-22: 100 ug/h via INTRAVENOUS
  Administered 2014-01-23 – 2014-01-24 (×3): 200 ug/h via INTRAVENOUS
  Filled 2014-01-22 (×4): qty 50

## 2014-01-22 MED ORDER — ETOMIDATE 2 MG/ML IV SOLN
INTRAVENOUS | Status: AC
  Filled 2014-01-22: qty 20

## 2014-01-22 MED ORDER — ETOMIDATE 2 MG/ML IV SOLN
20.0000 mg | Freq: Once | INTRAVENOUS | Status: AC
  Administered 2014-01-22: 20 mg via INTRAVENOUS

## 2014-01-22 MED ORDER — PANTOPRAZOLE SODIUM 40 MG IV SOLR
40.0000 mg | Freq: Every day | INTRAVENOUS | Status: DC
  Administered 2014-01-22 – 2014-01-26 (×5): 40 mg via INTRAVENOUS
  Filled 2014-01-22 (×5): qty 40

## 2014-01-22 MED ORDER — SUCCINYLCHOLINE CHLORIDE 20 MG/ML IJ SOLN
INTRAMUSCULAR | Status: AC
Start: 2014-01-22 — End: 2014-01-23
  Filled 2014-01-22: qty 1

## 2014-01-22 MED ORDER — CHLORHEXIDINE GLUCONATE 0.12 % MT SOLN
15.0000 mL | Freq: Two times a day (BID) | OROMUCOSAL | Status: DC
  Administered 2014-01-22 – 2014-02-05 (×28): 15 mL via OROMUCOSAL
  Filled 2014-01-22 (×31): qty 15

## 2014-01-22 MED ORDER — FENTANYL CITRATE 0.05 MG/ML IJ SOLN
50.0000 ug | Freq: Once | INTRAMUSCULAR | Status: AC
  Administered 2014-01-22: 50 ug via INTRAVENOUS

## 2014-01-22 MED ORDER — ROCURONIUM BROMIDE 50 MG/5ML IV SOLN
INTRAVENOUS | Status: AC
  Filled 2014-01-22: qty 2

## 2014-01-22 MED ORDER — ALBUTEROL SULFATE (2.5 MG/3ML) 0.083% IN NEBU
2.5000 mg | INHALATION_SOLUTION | Freq: Four times a day (QID) | RESPIRATORY_TRACT | Status: DC
  Administered 2014-01-22 – 2014-01-23 (×3): 2.5 mg via RESPIRATORY_TRACT
  Filled 2014-01-22 (×3): qty 3

## 2014-01-22 MED ORDER — BIOTENE DRY MOUTH MT LIQD
15.0000 mL | Freq: Two times a day (BID) | OROMUCOSAL | Status: DC
  Administered 2014-01-22 (×2): 15 mL via OROMUCOSAL

## 2014-01-22 MED ORDER — FENTANYL CITRATE 0.05 MG/ML IJ SOLN
INTRAMUSCULAR | Status: AC
  Administered 2014-01-22: 100 ug
  Filled 2014-01-22: qty 2

## 2014-01-22 MED ORDER — DEXMEDETOMIDINE HCL IN NACL 400 MCG/100ML IV SOLN
0.2000 ug/kg/h | INTRAVENOUS | Status: AC
  Administered 2014-01-22: 0.7 ug/kg/h via INTRAVENOUS
  Administered 2014-01-22: 1.2 ug/kg/h via INTRAVENOUS
  Administered 2014-01-22 (×2): 0.7 ug/kg/h via INTRAVENOUS
  Administered 2014-01-22 (×2): 0.4 ug/kg/h via INTRAVENOUS
  Administered 2014-01-22: 1.2 ug/kg/h via INTRAVENOUS
  Filled 2014-01-22: qty 100
  Filled 2014-01-22: qty 50
  Filled 2014-01-22 (×2): qty 100
  Filled 2014-01-22 (×2): qty 50
  Filled 2014-01-22: qty 100
  Filled 2014-01-22: qty 50
  Filled 2014-01-22: qty 100

## 2014-01-22 MED ORDER — DEXMEDETOMIDINE HCL IN NACL 200 MCG/50ML IV SOLN
INTRAVENOUS | Status: AC
  Filled 2014-01-22: qty 50

## 2014-01-22 MED ORDER — BUDESONIDE 0.25 MG/2ML IN SUSP
0.2500 mg | Freq: Four times a day (QID) | RESPIRATORY_TRACT | Status: DC
Start: 2014-01-22 — End: 2014-01-23
  Administered 2014-01-22: 0.25 mg via RESPIRATORY_TRACT
  Filled 2014-01-22 (×2): qty 2

## 2014-01-22 MED ORDER — ALBUTEROL SULFATE (2.5 MG/3ML) 0.083% IN NEBU
2.5000 mg | INHALATION_SOLUTION | RESPIRATORY_TRACT | Status: DC | PRN
  Administered 2014-02-02: 2.5 mg via RESPIRATORY_TRACT
  Filled 2014-01-22: qty 3

## 2014-01-22 MED ORDER — LIDOCAINE HCL (CARDIAC) 20 MG/ML IV SOLN
INTRAVENOUS | Status: AC
Start: 2014-01-22 — End: 2014-01-23
  Filled 2014-01-22: qty 5

## 2014-01-22 MED ORDER — FENTANYL CITRATE 0.05 MG/ML IJ SOLN
INTRAMUSCULAR | Status: AC
  Filled 2014-01-22: qty 2

## 2014-01-22 MED ORDER — MIDAZOLAM HCL 2 MG/2ML IJ SOLN
1.0000 mg | INTRAMUSCULAR | Status: DC | PRN
  Administered 2014-01-22 – 2014-01-25 (×17): 4 mg via INTRAVENOUS
  Administered 2014-01-25: 2 mg via INTRAVENOUS
  Administered 2014-01-25 – 2014-01-26 (×7): 4 mg via INTRAVENOUS
  Administered 2014-01-26: 2 mg via INTRAVENOUS
  Administered 2014-01-26 – 2014-01-29 (×10): 4 mg via INTRAVENOUS
  Administered 2014-01-29 – 2014-01-30 (×6): 2 mg via INTRAVENOUS
  Administered 2014-01-30: 3 mg via INTRAVENOUS
  Administered 2014-01-30 – 2014-01-31 (×4): 2 mg via INTRAVENOUS
  Administered 2014-01-31: 4 mg via INTRAVENOUS
  Administered 2014-01-31 (×2): 2 mg via INTRAVENOUS
  Filled 2014-01-22 (×2): qty 2
  Filled 2014-01-22 (×2): qty 4
  Filled 2014-01-22: qty 2
  Filled 2014-01-22: qty 4
  Filled 2014-01-22 (×2): qty 2
  Filled 2014-01-22 (×4): qty 4
  Filled 2014-01-22: qty 2
  Filled 2014-01-22: qty 4
  Filled 2014-01-22: qty 2
  Filled 2014-01-22 (×6): qty 4
  Filled 2014-01-22: qty 2
  Filled 2014-01-22 (×2): qty 4
  Filled 2014-01-22: qty 2
  Filled 2014-01-22: qty 4
  Filled 2014-01-22: qty 2
  Filled 2014-01-22: qty 4
  Filled 2014-01-22: qty 2
  Filled 2014-01-22 (×3): qty 4
  Filled 2014-01-22: qty 2
  Filled 2014-01-22: qty 4
  Filled 2014-01-22 (×2): qty 2
  Filled 2014-01-22 (×2): qty 4
  Filled 2014-01-22: qty 2
  Filled 2014-01-22 (×2): qty 4
  Filled 2014-01-22: qty 2
  Filled 2014-01-22 (×2): qty 4
  Filled 2014-01-22 (×2): qty 2
  Filled 2014-01-22: qty 4
  Filled 2014-01-22: qty 2
  Filled 2014-01-22 (×6): qty 4
  Filled 2014-01-22: qty 2

## 2014-01-22 MED ORDER — IPRATROPIUM BROMIDE 0.02 % IN SOLN
0.5000 mg | Freq: Four times a day (QID) | RESPIRATORY_TRACT | Status: DC
  Administered 2014-01-22 – 2014-01-23 (×3): 0.5 mg via RESPIRATORY_TRACT
  Filled 2014-01-22 (×3): qty 2.5

## 2014-01-22 MED ORDER — MIDAZOLAM HCL 2 MG/2ML IJ SOLN
INTRAMUSCULAR | Status: AC
  Administered 2014-01-22: 4 mg
  Filled 2014-01-22: qty 4

## 2014-01-22 MED ORDER — ETOMIDATE 2 MG/ML IV SOLN: 20.0000 mg/kg | Freq: Once | INTRAVENOUS | Status: DC

## 2014-01-22 MED ORDER — MIDAZOLAM HCL 2 MG/2ML IJ SOLN
INTRAMUSCULAR | Status: AC
  Filled 2014-01-22: qty 4

## 2014-01-22 NOTE — H&P (Signed)
Name: [name] MRN: [id number] DOB: 26-Nov-1941    ADMISSION DATE:  01/21/2014 CONSULTATION DATE:  01/21/14  REFERRING MD :  Gabriel Cirri PRIMARY SERVICE: PCCM  CHIEF COMPLAINT:  Fever, hypoxia  BRIEF PATIENT DESCRIPTION: 73 yo caucasian female resident of [place] found by staff with fever and hypoxia. Taken to ED on NRB 100% with improvement. PCCM asked to see patient for respiratory needs and hypotension refractory to IVF. Recent admission to hospital 12/25/13 for ESBL E. Coli>>Urosepsis.  SIGNIFICANT EVENTS / STUDIES:  12/31/13 - CT angio chest  - Emphysema. Right consolidation. 2 nodules in right apex (f/u CT 4-6 weeks recommended) 2/2 >> Admitted to PCCM, LIJ placed, shock  LINES / TUBES: PIV 2/2>>> LIJ 2/2>>> Foley 2/2>>>  CULTURES: Blood 2/2>>> Urine 2/2>>> (recent ESBL E. Coli isolated 12/31/13)>> resp virus 2/2>>  ANTIBIOTICS: Vancomycin 2/2>>> Primaxin 2/2>>>  HISTORY OF PRESENT ILLNESS:  73 yo caucasian female resident of [facility] with PMH cervical cancer, DM, HTN, anxiety, COPD, bipolar and recent admission 12/25/13-01/01/14 to hospital with acute encephalopathy due to urosepsis due to E.Coli ESBL. Treated with cefepime narrowed to cipro for 10 days total (ESBL resistant to both). Records report that staff went to check on patient and she was found to be febrile with low O2 sats. Patient was put on non-rebreather @100 % with some anxiety but with some resolution after O2 sats improved. Brought to [facility] ED for workup of possible sepsis.  SUBJECTIVE:  Awake and follows commands despite being on NIMVS  VITAL SIGNS: Temp:  [99.2 F (37.3 C)-104.2 F (40.1 C)] 101.3 F (38.5 C) (02/03 0600) Pulse Rate:  [92-138] 107 (02/03 0600) Resp:  [15-35] 23 (02/03 0600) BP: (73-165)/(32-124) 147/57 mmHg (02/03 0600) SpO2:  [88 %-100 %] 99 % (02/03 0600) FiO2 (%):  [40 %] 40 % (02/03 0920) Weight:  [180 lb (81.647 kg)-205 lb 11 oz (93.3 kg)] 205 lb 11 oz (93.3 kg)  (02/03 0359) HEMODYNAMICS: CVP:  [8 mmHg-16 mmHg] 13 mmHg VENTILATOR SETTINGS: Vent Mode:  [-] BIPAP FiO2 (%):  [40 %] 40 % Set Rate:  [15 bmp] 15 bmp PEEP:  [6 cmH20] 6 cmH20 INTAKE / OUTPUT: Intake/Output     02/02 0701 - 02/03 0700 02/03 0701 - 02/04 0700   I.V. (mL/kg) 2249.5 (24.1) 19 (0.2)   IV Piggyback 2000    Total Intake(mL/kg) 4249.5 (45.5) 19 (0.2)   Urine (mL/kg/hr) 3326    Total Output 3326     Net +923.5 +19          PHYSICAL EXAMINATION:  General appearance - in mild to moderate distress, ill-appearing Mental status - follows commands Eyes - PERRL Mouth - mucous membranes moist, pharynx normal without lesions Neck - supple, no significant adenopathy, LIJ in place Chest - distant breath sounds bilaterally Heart - normal rate, regular rhythm, normal S1, S2, no murmurs, rubs, clicks or gallops Abdomen - soft, nontender, nondistended, no masses or organomegaly Neurological - more alert but confused at times Musculoskeletal - no joint tenderness, deformity or swelling Extremities - peripheral pulses normal, no pedal edema, no clubbing or cyanosis Skin - normal coloration and turgor, no rashes, no suspicious skin lesions noted   LABS:  CBC  Recent Labs Lab 01/21/14 1139 01/22/14 0422  WBC 13.7* 16.6*  HGB 11.0* 9.8*  HCT 35.1* 31.6*  PLT PLATELET CLUMPS NOTED ON SMEAR, COUNT APPEARS ADEQUATE 129*   Coag's  Recent Labs Lab 01/21/14 1139  INR 0.92   BMET  Recent Labs Lab 01/21/14 1139 01/22/14 0422  NA 141 143  K 5.0 3.8  CL 98 103  CO2 28 29  BUN 9 7  CREATININE 0.52 0.52  GLUCOSE 239* 200*   Electrolytes  Recent Labs Lab 01/21/14 1139 01/22/14 0422  CALCIUM 8.8 7.5*   Sepsis Markers  Recent Labs Lab 01/21/14 1207 01/21/14 1640 01/21/14 1820 01/22/14 0420  LATICACIDVEN 5.00* 4.8*  --   --   PROCALCITON  --   --  2.20 1.71   ABG  Recent Labs Lab 01/21/14 1138 01/21/14 2150 01/22/14 0101  PHART 7.344* 7.297* 7.374   PCO2ART 54.9* 58.6* 51.5*  PO2ART 67.3* 81.2 90.2   Liver Enzymes  Recent Labs Lab 01/21/14 1139  AST 30  ALT 14  ALKPHOS 57  BILITOT 0.2*  ALBUMIN 3.2*   Cardiac Enzymes No results found for this basename: TROPONINI, PROBNP,  in the last 168 hours Glucose  Recent Labs Lab 01/21/14 1635 01/21/14 1759 01/21/14 2002 01/22/14 0003 01/22/14 0338 01/22/14 0750  GLUCAP 189* 231* 183* 149* 181* 189*    Imaging Dg Chest Port 1 View  01/22/2014   CLINICAL DATA:  Airspace disease.  Hospital acquired pneumonia.  EXAM: PORTABLE CHEST - 1 VIEW  COMPARISON:  DG CHEST 1V PORT dated 01/21/2014; DG CHEST 1V PORT dated 01/21/2014; DG CHEST 1V PORT dated 01/21/2014; DG CHEST 1V PORT dated 12/30/2013; CT ANGIO CHEST W/CM &/OR WO/CM dated 12/31/2013  FINDINGS: Left IJ central line is unchanged. Monitoring leads project over the chest. Right basilar airspace disease is present in the right infrahilar region, slightly more pronounced than on yesterday's examination. Less pronounced airspace disease in the left lung appears slightly improved compared to prior. Monitoring leads project over the chest. Cardiopericardial silhouette unchanged. No pneumothorax.  IMPRESSION: Slight worsening of airspace disease in the right infrahilar region, probably representing fluctuating superimposed pulmonary edema. Mild improvement in airspace disease in the left lung. Unchanged left IJ central line with the tip in the mid SVC.   Electronically Signed   By: Dereck Ligas M.D.   On: 01/22/2014 07:32   Dg Chest Port 1 View  01/21/2014   CLINICAL DATA:  Respiratory distress.  EXAM: PORTABLE CHEST - 1 VIEW  COMPARISON:  Plain film of the chest earlier this same day. CT chest 12/31/2012.  FINDINGS: Left IJ central venous catheter is identified. Bilateral airspace disease, worst in the right lung base, persists. Airspace opacity in the left lung has increased. No pneumothorax identified. Heart size is enlarged.  IMPRESSION: Increased  bilateral airspace disease likely due to pulmonary edema.   Electronically Signed   By: Inge Rise M.D.   On: 01/21/2014 23:22   Dg Chest Port 1 View  01/21/2014   CLINICAL DATA:  Left IJ central line placement  EXAM: PORTABLE CHEST - 1 VIEW  COMPARISON:  Earlier the same day  FINDINGS: 1609 hrs. Left IJ central line is new in the interval. Catheter tip projects at the level of the mid SVC. No evidence for left-sided pneumothorax.  Diffuse interstitial and patchy bilateral airspace disease persists with a basilar predominance of the airspace involvement. Cardiopericardial silhouette is borderline increased and stable in appearance. Telemetry leads overlie the chest.  IMPRESSION: The left IJ central line tip overlies the mid SVC.   Electronically Signed   By: Misty Stanley M.D.   On: 01/21/2014 16:15   Dg Chest Port 1 View  01/21/2014   CLINICAL DATA:  Fever with shortness of breath.  EXAM:  PORTABLE CHEST - 1 VIEW  COMPARISON:  CT ANGIO CHEST W/CM &/OR WO/CM dated 12/31/2013; DG CHEST 1V PORT dated 12/30/2013  FINDINGS: 1206 hr. Two views were obtained. There are lower lung volumes with stable patchy bibasilar pulmonary opacities based on the second view. No progressive airspace disease or significant pleural effusion is identified. The heart size and mediastinal contours are stable. Deformity of the proximal right humerus appears stable.  IMPRESSION: Stable patchy bibasilar airspace opacities compared with recent prior studies. No new findings demonstrated.   Electronically Signed   By: Camie Patience M.D.   On: 01/21/2014 12:37     ASSESSMENT / PLAN:  PULMONARY A: Respiratory distress COPD R/o early HCAP vs ARDS P:   Goal SpO2 >92% Continue home albuterol neb, advair, and spiriva pcxr in am  PRN NIMVS, may need intubation. She is hypercarbic on bipap but was given versed for severe agitation. If pna is from aspiration then NIMVS will exacerbate. Monitor c x r for co existing pna (?aspiration)  and UTI Need to consider and discuss code status - she is a poor candidate for MV due to overall functional status  CARDIOVASCULAR A: Septic shock Hypotension (hx HTN) Tachycardia P:  Continue NS @ 125, obtained cvp 10 EGDT Levophed to map 60 (currently off) Follow  lactic acid ordered for 2/3 and 2/4 Hold home anti-hypertensives (lopressor) Cortisol 16(but off pressor therefore no steroids, esp in bipolar individual)   RENAL A:   UTI AG metabolic acidosis, 15 Hx E. Coli urosepsis, ESBL - undertreated from last admission P:   Check lactic acid (4.8) Monitor BMET Abx as below  GASTROINTESTINAL A:   Hx GERD SUP P:   Protonix Npo lft  HEMATOLOGIC A:  Anemia, stable (asymptomatic), dvt prevention P:  Follow CBC Sub  q heparin  INFECTIOUS A:  Suspected urosepsis, ESBL source- not treated fully  Febrile (104.2) Leukocytosis P:   Imipenem + vanco   ENDOCRINE A:   DMII (01/07/14 A1C = 8.1) Hyperglycemia R/o rel AI P:   Cont IVF CBG checks SSI - sensitive Dc metformin, lactic acid Lantus hold as npo   NEUROLOGIC A:  Acute encephalopathy Anxiety Bipolar Hx acute encephalopathy 2/2 urosepsis P:   Hold benzos, oxycodone, seroquel, trazodone, and venlafaxine fent Treat infection  TODAY'S SUMMARY: On NIMVS and precedex and is calmer. Fio2 0% with O2 sat 100%. Treat presumed recurrent UTI, avoid intubation if possible. Underlying anxiety/bipolar make her appears sicker than she might be. If she is aspirating then NIMVS is poor choice. At high risk ARDS/ALI.   50 minutes CC time  [facility] Minor ACNP Maryanna Shape PCCM Pager [phone] till 3 pm If no answer page [phone] 01/22/2014, 9:52 AM   Baltazar Apo, MD, PhD 01/22/2014, 10:24 AM [place] [facility] [phone] or if no answer [phone]

## 2014-01-22 NOTE — Progress Notes (Signed)
[facility] Progress Note Patient Name: [name] DOB: [birth year] MRN: [id number]  Date of Service  01/22/2014   HPI/Events of Note  Continued issues of agitation/anxiety on BiPAP.  Had received fentanyl/versed earlier which helped briefly but now calling out, dyssynchronous on BiPAP.   eICU Interventions  Plan: Will try precedex  Continue to monitor patient   Intervention Category Major Interventions: Delirium, psychosis, severe agitation - evaluation and management  Simcha Speir 01/22/2014, 12:38 AM

## 2014-01-22 NOTE — Progress Notes (Signed)
CARE MANAGEMENT NOTE 01/22/2014  Patient:  [name], [name]   Account Number:  [id number]  Date Initiated:  01/22/2014  Documentation initiated by:  DAVIS,RHONDA  Subjective/Objective Assessment:   pt with urosepsis admitted with fever /required intubation due to hypoxia     Action/Plan:   snf   Anticipated DC Date:  01/25/2014   Anticipated DC Plan:  SKILLED NURSING FACILITY  In-house referral  Clinical Social Worker      DC Planning Services  NA      [facility] Choice  NA   Choice offered to / List presented to:  NA   DME arranged  NA      DME agency  NA     [place] arranged  NA      [place] agency  NA   Status of service:  In process, will continue to follow Medicare Important Message given?  NA - LOS <3 / Initial given by admissions (If response is "NO", the following Medicare IM given date fields will be blank) Date Medicare IM given:   Date Additional Medicare IM given:    Discharge Disposition:    Per UR Regulation:  Reviewed for med. necessity/level of care/duration of stay  If discussed at [place] of Stay Meetings, dates discussed:    Comments:  [id number]/Rhonda Eldridge Dace, BSN, [place] [phone] Chart Reviewed for discharge and hospital needs. Discharge needs at time of review:  None present will follow for needs. Review of patient progress due on [id number].

## 2014-01-22 NOTE — Progress Notes (Signed)
Repeat cxr also show ET tube 61mm above carina  Plan Pull back et tube 1.5cm  Dr. Brand Males, M.D., [facility].C.P Pulmonary and Critical Care Medicine Staff Physician [place] [facility] Pager: [phone], If no answer or between  15:00h - 7:00h: call [phone]  01/22/2014 6:48 PM

## 2014-01-22 NOTE — Procedures (Signed)
Intubation Procedure Note ELLAMAY FORS [id number] [birth year]  Procedure: Intubation Indications: Respiratory Failure  Procedure Details  Consent: Time Out: Verified patient identification, verified procedure, site/side was marked, verified correct patient position, special equipment/implants available, medications/allergies/relevent history reviewed, required imaging and test results available.   .Pt intubated per Marni Griffon, NP with good sterile technique. Intubated using the glide scope and #7.5 ETT.  Good color change with CO2 detector. ETT secured at 23 @ lip. CXR obtained. ETT was pulled back 2 cm, and secured at 21 cm @ lip.   Evaluation Hemodynamic Status: BP stable throughout; O2 sats: stable throughoutPatient's Current Condition: stable Complications: No apparent complications Patient did tolerate procedure well. Chest X-ray ordered to verify placement.  CXR: tube position low-repostitioned.   Nelida Gores, Madeleine Fenn L 01/22/2014

## 2014-01-22 NOTE — Procedures (Signed)
Intubation Procedure Note [name] [id number] 1941-06-04  Procedure: Intubation Indications: Respiratory insufficiency  Procedure Details Consent: Unable to obtain consent because of emergent medical necessity. Time Out: Verified patient identification, verified procedure, site/side was marked, verified correct patient position, special equipment/implants available, medications/allergies/relevent history reviewed, required imaging and test results available.  Performed  MAC/ 3 glide scope     Evaluation Hemodynamic Status: BP stable throughout; O2 sats: stable throughout Patient's Current Condition: stable Complications: No apparent complications Patient did tolerate procedure well. Chest X-ray ordered to verify placement.  CXR: ETT just above carina. Order to retract 2 cm given verbally   BABCOCK,PETE 01/22/2014

## 2014-01-22 NOTE — Progress Notes (Signed)
Variable  0 Points 1 Point 2 Points Total  Heart rate per minute  <90 beats 90-109 beats >110 beats 2  Respiratory  Rate per minute < 18 breaths 19-30 breaths  >30 breaths 2  Restlessness; nonpurposeful movements None  occas slight movement Frequent movement 2  Paradoxical breathing pattern: None  Present 1  Accessory muscle use: rise in clavicle during inspiration None Slight rise Pronounced rise 2  Grunting at end-expiration: guttural sound None  Present 2  Nasal flaring: involuntary movement of nares None  Present 1  Look of fear None  Eyes wide 2  Overall total out of 16    14    Respiratory Distress Observation Scale Journal of Palliative Medicine Vol. 13, Number 3, 2010 Campbell et al.   A Severe Resp distress  P Dr Alva Garnet bedside MD and Marni Griffon NP informed of need for itnubation; they are tied up in procedure at ER but will be at bedside shortly RN insturcted to get intubation RSI kit ready and consent daughter  Dr. Brand Males, M.D., [facility].C.P Pulmonary and Critical Care Medicine Staff Physician [place] [facility] Pager: [phone], If no answer or between  15:00h - 7:00h: call [phone]  01/22/2014 4:31 PM

## 2014-01-22 NOTE — Progress Notes (Signed)
Agitated despite fent 200 and precedex 1.2  Plan Start versed prn  Dr. Brand Males, M.D., [facility].C.P Pulmonary and Critical Care Medicine Staff Physician [place] [facility] Pager: [phone], If no answer or between  15:00h - 7:00h: call [phone]  01/22/2014 10:34 PM

## 2014-01-22 NOTE — Progress Notes (Addendum)
Patient ID: [name], female   DOB: 24-Feb-1941, 73 y.o.   MRN: [id number]                HISTORY & PHYSICAL  DATE:  01/04/2014     FACILITY:  [facility]    LEVEL OF CARE:   SNF   CHIEF COMPLAINT:    Readmission to SNF, post stay at [facility], 12/25/2013 through 01/01/2014.  HISTORY OF PRESENT ILLNESS:  [name] is a 73 year-old chronic patient in this facility.    She has known COPD, a history of pulmonary emboli with an IVC filter in place.  Several years ago, she had a retroperitoneal hemorrhage at the time of acute anticoagulation for pulmonary embolism.  This was the reason for the IVC filter.    She also has bipolar affective disorder, which is probably one of the more difficult things to care for in this patient.      On this occasion, she was sent to hospital with lethargy.  I gather she was found to be hypotensive in the ER and received copious amounts of IV fluid.  She was felt to be in septic shock.  A urine culture grew E.coli.  Her blood cultures were negative.  She received cefepime, changing to Cipro for 10 days.  Her influenza panel was negative.    In the course of the work-up of her hypotension, she had a CT scan of the chest.  This showed a focal opacity in the right apex measuring 2.7 x 1.6, with a nearby nodular opacity in the right apex measuring 1.1 x 1.1.  There was also a 6.6 cm mass in the anterior segment of the left upper lobe.  There was consolidation in the posterior segment of the right lower lobe.    A baseline portable chest x-ray did not show any abnormalities in the hospital.    PAST MEDICAL HISTORY/PROBLEM LIST:  COPD.    Type 2 diabetes.    Hypertension.    Chronic pain syndrome.    Peripheral neuropathy.     Hyperlipidemia.    History of a DVT/PE, status post IVC filter placement.  See discussion above.    History of cervical cancer dating back remotely.    PAST SURGICAL HISTORY:  IVC filter placement.    Cataract  extraction.    Achilles tendon surgery.    Cholecystectomy.    Abdominal hysterectomy.    Tonsillectomy.    CURRENT MEDICATIONS:  Discharge medications include:    Tylenol 650 q.4 p.r.n.    Xanax 1 mg four times daily.    ASA 81 q.d.    Dulcolax 10 mg rectally three times a week on Monday, Wednesday, and Friday.    Depakote ER 250 mg two times daily.    Fish oil 1 g two times daily.    Advair Diskus 500/50 b.i.d.    Neurontin 900 three times daily.    Norco 5/325, 2 tablets q.8 h p.r.n.    NovoLog 3 U before meals.    Lantus 20 U at bedtime.    DuoNebs 3 mL every 6 hours.    Xopenex 2 puffs every 4 hours as needed.    Glucophage 1500 mg in the morning and 1000 with supper.    Lopressor 25 b.i.d.    Prozac 20 b.i.d.    OxyContin 10 mg every 12 hours.    Spiriva 18 mcg daily.    Demadex 40 mg in the morning.  Desyrel 100 mg at bedtime.    Effexor XR 150 mg in the morning.    REVIEW OF SYSTEMS:   GENERAL:  The patient states she feels a lot better.   She states she lost 4 lbs in the hospital.   CHEST/RESPIRATORY:  States her shortness of breath is at baseline.  There is no cough.   CARDIAC:   No exertional chest pain.    GI:  No abdominal pain.   GU:  No dysuria.    PHYSICAL EXAMINATION:   VITAL SIGNS:   RESPIRATIONS:  24.   PULSE:  76.   O2 SATURATIONS:  92% on 2 L.    GENERAL APPEARANCE:  She does not look to be in any distress.   CHEST/RESPIRATORY:  Fairly clear air entry bilaterally.  No wheezing.   Her work of breathing is normal.   CARDIOVASCULAR:  CARDIAC:   Heart sounds are normal.  No increase in jugular venous pressure.  No clear evidence of heart failure.   GASTROINTESTINAL:  ABDOMEN:   Mildly distended.   LIVER/SPLEEN/KIDNEYS:  No liver, no spleen.  No tenderness.   GENITOURINARY:  BLADDER:   Not enlarged.   CIRCULATION:   EDEMA/VARICOSITIES:  Extremities:  She has edema in her legs up to her posterior thighs.  She has compression  stockings on the lower legs.  I did not remove these.   PSYCHIATRIC:   MENTAL STATUS:   Appears to be back to baseline.    ASSESSMENT/PLAN:  Felt to have septic shock secondary to pyelonephritis and probably prerenal factors.  She required temporary pressor support and copious amounts of IV fluid.  She is completing 10 days with ciprofloxacin, although I do not see the ciprofloxacin on her medication list.     COPD.  This appears to be stable.    Pulmonary nodules in the right upper lobe and the superior segment of the left upper lobe.  Suggestions were made for either a PET scan or a repeat CT scan in 4-6 weeks.    Question urinary retention.  I will need to look into this further.  An outpatient Urology appointment was suggested.    Bipolar affective disorder.  She does not appear to be different from previously.    Type 2 diabetes.  On insulin and metformin.  This does not appear to be unstable.    The major issue I see here is the issue of follow-up for her pulmonary nodules.  I will need to discuss this with her daughter at some point.  This patient certainly could not take any form of surgical therapy if these turned out to be malignant.  She will not be able to tolerate this physically, or psychiatrically for that matter.

## 2014-01-22 NOTE — Procedures (Signed)
Intubation Procedure Note [name] [id number] April 03, 1941  Procedure: Intubation Indications: Respiratory insufficiency   Procedure Details Consent: Risks of procedure as well as the alternatives and risks of each were explained to the (patient/caregiver).  Consent for procedure obtained. Time Out: Verified patient identification, verified procedure, site/side was marked, verified correct patient position, special equipment/implants available, medications/allergies/relevent history reviewed, required imaging and test results available.  Performed   Maximum sterile technique was used including gloves, gown and hand hygiene.  MAC and 3    Evaluation Hemodynamic Status: BP stable throughout; O2 sats: stable throughout Patient's Current Condition: stable Complications: No apparent complications Patient did tolerate procedure well. Chest X-ray ordered to verify placement.  CXR: tube position low-repostitioned.   [name], Leroi Haque L 01/22/2014

## 2014-01-22 NOTE — Progress Notes (Signed)
Called to room. Pt in acute distress. Failing NIPPV. Intubated per request of Dr Chase Caller.   Marni Griffon ACNP-BC [place] Pager # [phone] OR # [phone] if no answer

## 2014-01-23 ENCOUNTER — Inpatient Hospital Stay (HOSPITAL_COMMUNITY): Payer: Medicare Other

## 2014-01-23 LAB — BLOOD GAS, ARTERIAL
Acid-Base Excess: 0.5 mmol/L (ref 0.0–2.0)
BICARBONATE: 25.4 meq/L — AB (ref 20.0–24.0)
Drawn by: 11249
FIO2: 0.6 %
MECHVT: 400 mL
O2 SAT: 93.9 %
PATIENT TEMPERATURE: 99.6
PEEP: 5 cmH2O
RATE: 14 resp/min
TCO2: 23.8 mmol/L (ref 0–100)
pCO2 arterial: 46.3 mmHg — ABNORMAL HIGH (ref 35.0–45.0)
pH, Arterial: 7.361 (ref 7.350–7.450)
pO2, Arterial: 70.7 mmHg — ABNORMAL LOW (ref 80.0–100.0)

## 2014-01-23 LAB — GLUCOSE, CAPILLARY
GLUCOSE-CAPILLARY: 192 mg/dL — AB (ref 70–99)
GLUCOSE-CAPILLARY: 174 mg/dL — AB (ref 70–99)
GLUCOSE-CAPILLARY: 198 mg/dL — AB (ref 70–99)
GLUCOSE-CAPILLARY: 184 mg/dL — AB (ref 70–99)
GLUCOSE-CAPILLARY: 168 mg/dL — AB (ref 70–99)
Glucose-Capillary: 208 mg/dL — ABNORMAL HIGH (ref 70–99)

## 2014-01-23 LAB — BASIC METABOLIC PANEL
BUN: 6 mg/dL (ref 6–23)
CHLORIDE: 105 meq/L (ref 96–112)
CO2: 26 meq/L (ref 19–32)
Calcium: 7.6 mg/dL — ABNORMAL LOW (ref 8.4–10.5)
Creatinine, Ser: 0.4 mg/dL — ABNORMAL LOW (ref 0.50–1.10)
GFR calc Af Amer: >90 mL/min (ref >90)
GFR calc non Af Amer: >90 mL/min (ref >90)
Glucose, Bld: 208 mg/dL — ABNORMAL HIGH (ref 70–99)
Potassium: 3.3 mEq/L — ABNORMAL LOW (ref 3.7–5.3)
SODIUM: 141 meq/L (ref 137–147)

## 2014-01-23 LAB — CBC
HCT: 31.1 % — ABNORMAL LOW (ref 36.0–46.0)
Hemoglobin: 9.6 g/dL — ABNORMAL LOW (ref 12.0–15.0)
MCH: 28.5 pg (ref 26.0–34.0)
MCHC: 30.9 g/dL (ref 30.0–36.0)
MCV: 92.3 fL (ref 78.0–100.0)
Platelets: 122 10*3/uL — ABNORMAL LOW (ref 150–400)
RBC: 3.37 MIL/uL — AB (ref 3.87–5.11)
RDW: 15.8 % — ABNORMAL HIGH (ref 11.5–15.5)
WBC: 13.6 10*3/uL — AB (ref 4.0–10.5)

## 2014-01-23 LAB — URINE CULTURE
Colony Count: >=100000
Special Requests: NORMAL

## 2014-01-23 LAB — MAGNESIUM: MAGNESIUM: 1.4 mg/dL — AB (ref 1.5–2.5)

## 2014-01-23 LAB — PHOSPHORUS: PHOSPHORUS: 2 mg/dL — AB (ref 2.3–4.6)

## 2014-01-23 LAB — LACTIC ACID, PLASMA: Lactic Acid, Venous: 0.9 mmol/L (ref 0.5–2.2)

## 2014-01-23 LAB — PROCALCITONIN: Procalcitonin: 0.68 ng/mL

## 2014-01-23 MED ORDER — DEXMEDETOMIDINE HCL IN NACL 400 MCG/100ML IV SOLN
0.2000 ug/kg/h | INTRAVENOUS | Status: AC
  Administered 2014-01-23 (×2): 1.2 ug/kg/h via INTRAVENOUS
  Administered 2014-01-23: 0.999 ug/kg/h via INTRAVENOUS
  Administered 2014-01-23 – 2014-01-24 (×4): 1.2 ug/kg/h via INTRAVENOUS
  Filled 2014-01-23 (×7): qty 100

## 2014-01-23 MED ORDER — IPRATROPIUM-ALBUTEROL 0.5-2.5 (3) MG/3ML IN SOLN
3.0000 mL | Freq: Four times a day (QID) | RESPIRATORY_TRACT | Status: DC
  Administered 2014-01-23 – 2014-02-04 (×46): 3 mL via RESPIRATORY_TRACT
  Filled 2014-01-23 (×47): qty 3

## 2014-01-23 MED ORDER — SODIUM CHLORIDE 0.9 % IV SOLN
1000.0000 mL | INTRAVENOUS | Status: DC
  Administered 2014-01-23 (×2): 1000 mL via INTRAVENOUS

## 2014-01-23 MED ORDER — BUDESONIDE 0.25 MG/2ML IN SUSP
0.2500 mg | Freq: Four times a day (QID) | RESPIRATORY_TRACT | Status: DC
  Administered 2014-01-23 – 2014-01-24 (×6): 0.25 mg via RESPIRATORY_TRACT
  Filled 2014-01-23 (×13): qty 2

## 2014-01-23 MED ORDER — VITAL HIGH PROTEIN PO LIQD
1000.0000 mL | ORAL | Status: DC
  Administered 2014-01-23 – 2014-01-24 (×2): 1000 mL
  Filled 2014-01-23 (×3): qty 1000

## 2014-01-23 MED ORDER — POTASSIUM CHLORIDE 10 MEQ/100ML IV SOLN
10.0000 meq | INTRAVENOUS | Status: AC
  Administered 2014-01-23 (×4): 10 meq via INTRAVENOUS
  Filled 2014-01-23 (×4): qty 100

## 2014-01-23 MED ORDER — OXEPA PO LIQD: 1000.0000 mL | ORAL | Status: DC

## 2014-01-23 MED ORDER — BIOTENE DRY MOUTH MT LIQD
15.0000 mL | Freq: Four times a day (QID) | OROMUCOSAL | Status: DC
  Administered 2014-01-23 – 2014-02-01 (×37): 15 mL via OROMUCOSAL

## 2014-01-23 MED ORDER — ADULT MULTIVITAMIN LIQUID CH
5.0000 mL | Freq: Every day | ORAL | Status: DC
  Administered 2014-01-23 – 2014-02-01 (×10): 5 mL
  Filled 2014-01-23 (×11): qty 5

## 2014-01-23 NOTE — Progress Notes (Addendum)
INITIAL NUTRITION ASSESSMENT  DOCUMENTATION CODES Per approved criteria  -Obesity Unspecified   INTERVENTION: - Initiate TF via OGT of Vital High Protein at 32ml/hr, increase by 44ml every 12 hours to goal rate of 25ml/hr. Advancing TF slowly due to pt at refeeding risk. Goal rate will provide 1200 calories, 105g protein, and 108ml free water and meet 65% estimated calorie needs and 100% estimated protein needs, based on ASPEN guidelines for permissive underfeeding in critically ill obese individuals.  - Multivitamin 33ml via OGT daily - Initiate adult enteral protocol - Will continue to monitor   NUTRITION DIAGNOSIS: Inadequate oral intake related to inability to eat as evidenced by NPO.   Goal: Enteral nutrition to provide 60-70% of estimated calorie needs (22-25 kcals/kg ideal body weight) and 100% of estimated protein needs, based on ASPEN guidelines for permissive underfeeding in critically ill obese individuals.  Monitor:  Weights, labs, TF advancement/tolerance, vent  Reason for Assessment: Ventilated pt, low braden, consult to manage TF  73 y.o. female  Admitting Dx: Septic shock  ASSESSMENT: Admitted with fever, shortness of breath, and altered mental status. From [place] facility. Pt seen by inpatient RD at [facility] last month who noted pt was eating very poorly at her nursing home and weighed 203 pounds. Intubated yesterday for respiratory failure. Received consult for TF management. Due to pt's low potassium, magnesium, and phosphorus and very poor intake last admission, pt at risk of refeeding syndrome. Will advance TF very slowly.   Patient is currently intubated on ventilator support.  MV: 8.3 L/min Temp (24hrs), Avg:99.5 F (37.5 C), Min:98.4 F (36.9 C), Max:100.2 F (37.9 C)  Propofol: off  Potassium, magnesium, and phosphorus low - Getting IV potassium replacement   Height: Ht Readings from Last 1 Encounters:  01/21/14 5\' 2"  (1.575 m)     Weight: Wt Readings from Last 1 Encounters:  01/23/14 205 lb 0.4 oz (93 kg)    Ideal Body Weight: 110 lb  % Ideal Body Weight: 186%  Wt Readings from Last 10 Encounters:  01/23/14 205 lb 0.4 oz (93 kg)  01/01/14 195 lb 15.8 oz (88.9 kg)  06/25/13 202 lb 6.4 oz (91.808 kg)  04/04/13 200 lb (90.719 kg)  03/12/13 201 lb (91.173 kg)  04/02/12 194 lb 14.2 oz (88.4 kg)  03/09/12 185 lb (83.915 kg)  02/21/12 178 lb (80.74 kg)  01/31/12 175 lb 3.2 oz (79.47 kg)  11/15/11 180 lb (81.647 kg)    Usual Body Weight: 203 lb last month  % Usual Body Weight: 101%  BMI:  Body mass index is 37.49 kg/(m^2). Class II obesity  Estimated Nutritional Needs: Kcal: 1837 Protein: 100-120g Fluid: >1.8L/day  Skin: +1 generalized edema, non-pitting RUE, RLE, LLE edema, stage I sacral pressure ulcer, skin tear left elbow  Diet Order: NPO  EDUCATION NEEDS: -No education needs identified at this time   Intake/Output Summary (Last 24 hours) at 01/23/14 0915 Last data filed at 01/23/14 0600  Gross per 24 hour  Intake 4121.79 ml  Output   2000 ml  Net 2121.79 ml    Last BM: PTA  Labs:   Recent Labs Lab 01/21/14 1139 01/22/14 0422 01/23/14 0415  NA 141 143 141  K 5.0 3.8 3.3*  CL 98 103 105  CO2 28 29 26   BUN 9 7 6   CREATININE 0.52 0.52 0.40*  CALCIUM 8.8 7.5* 7.6*  MG  --   --  1.4*  PHOS  --   --  2.0*  GLUCOSE 239* 200* 208*    CBG (last 3)   Recent Labs  01/22/14 2353 01/23/14 0409 01/23/14 0747  GLUCAP 208* 192* 174*    Scheduled Meds: . acetaminophen (TYLENOL) oral liquid 160 mg/5 mL  650 mg Oral Once  . albuterol  2.5 mg Nebulization Q6H  . antiseptic oral rinse  15 mL Mouth Rinse QID  . budesonide  0.25 mg Nebulization Q6H  . chlorhexidine  15 mL Mouth Rinse BID  . Chlorhexidine Gluconate Cloth  6 each Topical Q0600  . famotidine (PEPCID) IV  20 mg Intravenous Q12H  . heparin  5,000 Units Subcutaneous Q8H  . imipenem-cilastatin  500 mg Intravenous  Q8H  . insulin aspart  0-9 Units Subcutaneous Q4H  . ipratropium  0.5 mg Nebulization Q6H  . midazolam      . mupirocin ointment  1 application Nasal BID  . pantoprazole (PROTONIX) IV  40 mg Intravenous Daily  . vancomycin  1,000 mg Intravenous Q12H    Continuous Infusions: . sodium chloride 1,000 mL (01/23/14 0757)  . dexmedetomidine 1.2 mcg/kg/hr (01/23/14 0523)  . fentaNYL infusion INTRAVENOUS 200 mcg/hr (01/23/14 0453)  . norepinephrine (LEVOPHED) Adult infusion      Past Medical History  Diagnosis Date  . Anxiety   . Arthritis   . COPD (chronic obstructive pulmonary disease)   . GERD (gastroesophageal reflux disease)   . Hyperlipidemia   . Osteoporosis   . DVT (deep venous thrombosis)   . PE (pulmonary thromboembolism)   . Hypertension      PT DENIES....ON NO  MEDS  . Diabetes mellitus   . Cancer 1969    cervical  . Pneumonia     h/o  . Bronchitis     h/o  . Blood transfusion   . Anemia   . Bipolar affect, depressed     Past Surgical History  Procedure Laterality Date  . Vena cava filter placement    . 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 Eye surgery      CATARACTS  . Achilles tendon lengthening  11/18/11    and repair w/posterior tibial tendon lengthening; right  foot  . Cholecystectomy    . Abdominal hysterectomy  1969    "womb taken out for cervical cancer"  . Cataract extraction, bilateral  ~ 2008  . Tonsillectomy      "as a child"  . Achilles tendon surgery  11/18/2011    Procedure: ACHILLES TENDON REPAIR;  Surgeon: Wylene Simmer, MD;  Location: [facility];  Service: Orthopedics;  Laterality: Right;  Right Posterior Tibial Tendon Lenghtening and Tendon Achilles Lenghtening     Mikey College MS, RD, LDN [phone] Pager [phone] After Hours Pager

## 2014-01-23 NOTE — Progress Notes (Signed)
ANTIBIOTIC CONSULT NOTE - FOLLOW UP  Pharmacy Consult for Vancomycin, Primaxin Indication: Sepsis, recent ESBL E.coli UTI  Allergies  Allergen Reactions  . Benzodiazepines Other (See Comments)    Mood dysregulation  (on MAR)  . Morphine And Related Other (See Comments)    Pain dysregulation Mood disruption  . Olanzapine     Hallucinations and disorientation  . Sulfamethoxazole-Trimethoprim Hives    Patient Measurements: Height: 5\' 2"  (157.5 cm) Weight: 205 lb 0.4 oz (93 kg) IBW/kg (Calculated) : 50.1  Vital Signs: Temp: 98.1 F (36.7 C) (02/04 1200) Temp src: Core (Comment) (02/04 0800) BP: 161/60 mmHg (02/04 1200) Pulse Rate: 83 (02/04 1200) Intake/Output from previous day: 02/03 0701 - 02/04 0700 In: 4468.7 [I.V.:3418.7; NG/GT:30; IV Piggyback:800] Out: 2150 [Urine:1950; Emesis/NG output:200] Intake/Output from this shift: Total I/O In: 976.9 [I.V.:726.9; IV Piggyback:250] Out: 1000 [Urine:1000]  Labs:  Recent Labs  01/21/14 1139 01/22/14 0422 01/23/14 0415  WBC 13.7* 16.6* 13.6*  HGB 11.0* 9.8* 9.6*  PLT PLATELET CLUMPS NOTED ON SMEAR, COUNT APPEARS ADEQUATE 129* 122*  CREATININE 0.52 0.52 0.40*   Estimated Creatinine Clearance: 68.5 ml/min (by C-G formula based on Cr of 0.4). 58 ml/min/1.16m2 (normalized)  No results found for this basename: VANCOTROUGH, VANCOPEAK, VANCORANDOM, GENTTROUGH, GENTPEAK, GENTRANDOM, TOBRATROUGH, TOBRAPEAK, TOBRARND, AMIKACINPEAK, AMIKACINTROU, AMIKACIN,  in the last 72 hours   Microbiology: 1/6 urine >> pan-sensitive E. Coli 1/12 urine >100k ESBL E. Coli 2/2 urine >100k ESBL E.coli 2/2 blood x 2 >> ngtd  Anti-infectives: 2/2 >> Cefepime x 1  2/2 >> Vanc >>  2/2 >> Primaxin >>   Assessment: 50 yoF hospitalized at [facility] 1/7-1/13/15 with E.coli urosepsis with septic shock. Per notes from SNF on 1/23, pt was found with rash under breast and abdominal folds treated with nystatin cream. Pt presented to ED 2/2 with reported  fever and SOB at SNF. Pt found febrile, tachycardic, hypotensive. Sepsis protocol started and empiric abx with Vanc, Cefepime. Of note, recent E.coli from urine culture was an ESBL producer and patient was treated with Cefepime inpatient, changed to Cipro at discharge (not susceptible).   Day #3 Vancomycin 1g IV q12h and Primaxin 500mg  IV q8h  Tm 100.2  WBC remain elevated 13.6k  SCr wnl and stable, normalized CrCl ~58 ml/min (rounding SCr up to 1.0)  E.coli +ESBL growing from urine  Goal of Therapy:  Vancomycin trough level 15-20 mcg/ml  Primaxin dose per weight/renal function  Plan:   Check vancomycin trough tonight prior to 6th dose  Continue Primaxin 500mg  IV q8h  Can vancomycin be dc'd now that infecting organism has been identified?   Peggyann Juba, PharmD, BCPS Pager: [phone] 01/23/2014,1:07 PM

## 2014-01-23 NOTE — Progress Notes (Signed)
RN calling with vent dysnchrony  PRVC: flow starvation  Chane to PCV with same R R 14  Dr. Brand Males, M.D., [facility].C.P Pulmonary and Critical Care Medicine Staff Physician [place] [facility] Pager: [phone], If no answer or between  15:00h - 7:00h: call [phone]  01/23/2014 5:44 PM

## 2014-01-23 NOTE — Progress Notes (Signed)
Per [name] Minor NP O.K. not to do aline. Pt BP 166/73 and not on pressors.

## 2014-01-23 NOTE — Progress Notes (Signed)
  Comments:  [id number] Rosana Hoes RN, BSN, [place], [phone] Chart reviewed for update of needs and condition. Required intubation 2/3. + Ecoli this admit SS pending. Off pressors.. patient is from alf at [facility].

## 2014-01-23 NOTE — Progress Notes (Signed)
PT Cancellation Note  Patient Details Name: [name] MRN: [id number] DOB: May 24, 1941   Cancelled Treatment:    Reason Eval/Treat Not Completed: Medical issues which prohibited therapy (pt on vent.)   [name] 01/23/2014, 7:55 AM Tresa Endo PT [phone]

## 2014-01-23 NOTE — Progress Notes (Signed)
Name: [name] MRN: [id number] DOB: [birth year]    ADMISSION DATE:  01/21/2014 CONSULTATION DATE:  01/21/14  REFERRING MD :  Gabriel Cirri PRIMARY SERVICE: PCCM  CHIEF COMPLAINT:  Fever, hypoxia  BRIEF PATIENT DESCRIPTION: 73 yo caucasian female resident of [facility] found by staff with fever and hypoxia. Taken to ED on NRB 100% with improvement. PCCM asked to see patient for respiratory needs and hypotension refractory to IVF. Recent admission to hospital 12/25/13 for ESBL E. Coli>>Urosepsis.  SIGNIFICANT EVENTS / STUDIES:  12/31/13 - CT angio chest  - Emphysema. Right consolidation. 2 nodules in right apex (f/u CT 4-6 weeks recommended) 2/2 >> Admitted to PCCM, LIJ placed, shock 2/3 intubated for resp distress LINES / TUBES: PIV 2/2>>> LIJ 2/2>>> Foley 2/2>>> 2/3 OTT>>  CULTURES: Blood 2/2>>> Urine 2/2>>> (recent ESBL E. Coli isolated 12/31/13)>>e coli>> resp virus 2/2>>neg  ANTIBIOTICS: Vancomycin 2/2>>> Primaxin 2/2>>>  HISTORY OF PRESENT ILLNESS:  73 yo caucasian female resident of [facility] with PMH cervical cancer, DM, HTN, anxiety, COPD, bipolar and recent admission 12/25/13-01/01/14 to hospital with acute encephalopathy due to urosepsis due to E.Coli ESBL. Treated with cefepime narrowed to cipro for 10 days total (ESBL resistant to both). Records report that staff went to check on patient and she was found to be febrile with low O2 sats. Patient was put on non-rebreather @100 % with some anxiety but with some resolution after O2 sats improved. Brought to [facility] ED for workup of possible sepsis.  SUBJECTIVE:  Sedated on vent  VITAL SIGNS: Temp:  [98.4 F (36.9 C)-100.2 F (37.9 C)] 99.3 F (37.4 C) (02/04 0800) Pulse Rate:  [67-109] 94 (02/04 0800) Resp:  [13-37] 16 (02/04 0800) BP: (137-171)/(55-108) 169/88 mmHg (02/04 0800) SpO2:  [93 %-100 %] 100 % (02/04 0905) FiO2 (%):  [60 %-100 %] 60 % (02/04 0900) Weight:  [205 lb 0.4 oz (93 kg)] 205 lb 0.4 oz (93  kg) (02/04 0546) HEMODYNAMICS: CVP:  [27 mmHg] 27 mmHg VENTILATOR SETTINGS: Vent Mode:  [-] PRVC FiO2 (%):  [60 %-100 %] 60 % Set Rate:  [14 bmp] 14 bmp Vt Set:  [400 mL] 400 mL PEEP:  [5 cmH20] 5 cmH20 Plateau Pressure:  [14 cmH20-21 cmH20] 21 cmH20 INTAKE / OUTPUT: Intake/Output     02/03 0701 - 02/04 0700 02/04 0701 - 02/05 0700   I.V. (mL/kg) 3370.7 (36.2)    Other 220    NG/GT 30    IV Piggyback 800    Total Intake(mL/kg) 4420.7 (47.5)    Urine (mL/kg/hr) 1950 (0.9)    Emesis/NG output 200 (0.1)    Total Output 2150     Net +2270.7            PHYSICAL EXAMINATION:  General appearance -sedated on vent Mental status - sedated Eyes - PERRL Neck - supple, no significant adenopathy, LIJ in place Chest - distant breath sounds bilaterally Heart - normal rate, regular rhythm, normal S1, S2, no murmurs, rubs, clicks or gallops Abdomen - soft, nontender, nondistended, no masses or organomegaly Neurological -sedated Musculoskeletal - no joint tenderness, deformity or swelling Extremities - peripheral pulses normal, no pedal edema, no clubbing or cyanosis Skin - normal coloration and turgor, no rashes, no suspicious skin lesions noted   LABS:  CBC  Recent Labs Lab 01/21/14 1139 01/22/14 0422 01/23/14 0415  WBC 13.7* 16.6* 13.6*  HGB 11.0* 9.8* 9.6*  HCT 35.1* 31.6* 31.1*  PLT PLATELET CLUMPS NOTED ON SMEAR, COUNT APPEARS  ADEQUATE 129* 122*   Coag's  Recent Labs Lab 01/21/14 1139  INR 0.92   BMET  Recent Labs Lab 01/21/14 1139 01/22/14 0422 01/23/14 0415  NA 141 143 141  K 5.0 3.8 3.3*  CL 98 103 105  CO2 28 29 26   BUN 9 7 6   CREATININE 0.52 0.52 0.40*  GLUCOSE 239* 200* 208*   Electrolytes  Recent Labs Lab 01/21/14 1139 01/22/14 0422 01/23/14 0415  CALCIUM 8.8 7.5* 7.6*  MG  --   --  1.4*  PHOS  --   --  2.0*   Sepsis Markers  Recent Labs Lab 01/21/14 1640 01/21/14 1820 01/22/14 0420 01/22/14 1100 01/23/14 0415 01/23/14 0645   LATICACIDVEN 4.8*  --   --  1.0  --  0.9  PROCALCITON  --  2.20 1.71  --  0.68  --    ABG  Recent Labs Lab 01/22/14 0101 01/22/14 1815 01/23/14 0352  PHART 7.374 7.307* 7.361  PCO2ART 51.5* 54.8* 46.3*  PO2ART 90.2 293.0* 70.7*   Liver Enzymes  Recent Labs Lab 01/21/14 1139  AST 30  ALT 14  ALKPHOS 57  BILITOT 0.2*  ALBUMIN 3.2*   Cardiac Enzymes No results found for this basename: TROPONINI, PROBNP,  in the last 168 hours Glucose  Recent Labs Lab 01/22/14 1129 01/22/14 1540 01/22/14 1913 01/22/14 2353 01/23/14 0409 01/23/14 0747  GLUCAP 164* 169* 185* 208* 192* 174*    Imaging Portable Chest Xray In Am  01/23/2014   CLINICAL DATA:  Followup pneumonia  EXAM: PORTABLE CHEST - 1 VIEW  COMPARISON:  Chest x-ray from yesterday  FINDINGS: Endotracheal tube ends in the mid lower thoracic trachea, 2.3 cm above the carina. New orogastric tube, tip in the proximal stomach. IVC filter noted. Left IJ catheter in stable position.  Cardiomegaly which is stable from prior. Dense bibasilar opacities. The left mid lung is more densely consolidated today. Interstitial markings are diffusely coarsened, with cephalized blood flow. There are probable small bilateral pleural effusions. No pneumothorax.  IMPRESSION: 1. More extensive bilateral lung consolidation. 2. Suspect edema superimposed on the patient's pneumonia. 3. Tubes and lines are in good position, including the new orogastric tube.   Electronically Signed   By: Jorje Guild M.D.   On: 01/23/2014 06:44   Dg Chest Port 1 View  01/22/2014   CLINICAL DATA:  Intubation.  EXAM: PORTABLE CHEST - 1 VIEW  COMPARISON:  DG CHEST 1V PORT dated 01/22/2014  FINDINGS: Endotracheal tube 6 mm above the carina, proximal repositioning may prove useful. Left IJ line in good anatomic in stable position. Poor inspiration with dense bilateral pulmonary infiltrates . Small pleural effusion on the left. No pneumothorax. Heart size normal.  IMPRESSION: 1.  Interval intubation. Endotracheal tube 6 mm above the carina, proximal repositioning may prove useful. Left IJ line in stable position.  2. Dense bilateral pulmonary infiltrates.  Poor inspiration.  3. Small left pleural effusion.  These results will be called to the ordering clinician or representative by the Radiologist Assistant, and communication documented in the PACS Dashboard.   Electronically Signed   By: Marcello Moores  Register   On: 01/22/2014 17:26   Dg Chest Port 1 View  01/22/2014   CLINICAL DATA:  Airspace disease.  Hospital acquired pneumonia.  EXAM: PORTABLE CHEST - 1 VIEW  COMPARISON:  DG CHEST 1V PORT dated 01/21/2014; DG CHEST 1V PORT dated 01/21/2014; DG CHEST 1V PORT dated 01/21/2014; DG CHEST 1V PORT dated 12/30/2013; CT ANGIO CHEST W/CM &/OR  WO/CM dated 12/31/2013  FINDINGS: Left IJ central line is unchanged. Monitoring leads project over the chest. Right basilar airspace disease is present in the right infrahilar region, slightly more pronounced than on yesterday's examination. Less pronounced airspace disease in the left lung appears slightly improved compared to prior. Monitoring leads project over the chest. Cardiopericardial silhouette unchanged. No pneumothorax.  IMPRESSION: Slight worsening of airspace disease in the right infrahilar region, probably representing fluctuating superimposed pulmonary edema. Mild improvement in airspace disease in the left lung. Unchanged left IJ central line with the tip in the mid SVC.   Electronically Signed   By: Dereck Ligas M.D.   On: 01/22/2014 07:32   Dg Chest Port 1 View  01/21/2014   CLINICAL DATA:  Respiratory distress.  EXAM: PORTABLE CHEST - 1 VIEW  COMPARISON:  Plain film of the chest earlier this same day. CT chest 12/31/2012.  FINDINGS: Left IJ central venous catheter is identified. Bilateral airspace disease, worst in the right lung base, persists. Airspace opacity in the left lung has increased. No pneumothorax identified. Heart size is enlarged.   IMPRESSION: Increased bilateral airspace disease likely due to pulmonary edema.   Electronically Signed   By: Inge Rise M.D.   On: 01/21/2014 23:22   Dg Chest Port 1 View  01/21/2014   CLINICAL DATA:  Left IJ central line placement  EXAM: PORTABLE CHEST - 1 VIEW  COMPARISON:  Earlier the same day  FINDINGS: 1609 hrs. Left IJ central line is new in the interval. Catheter tip projects at the level of the mid SVC. No evidence for left-sided pneumothorax.  Diffuse interstitial and patchy bilateral airspace disease persists with a basilar predominance of the airspace involvement. Cardiopericardial silhouette is borderline increased and stable in appearance. Telemetry leads overlie the chest.  IMPRESSION: The left IJ central line tip overlies the mid SVC.   Electronically Signed   By: Misty Stanley M.D.   On: 01/21/2014 16:15   Dg Chest Port 1 View  01/21/2014   CLINICAL DATA:  Fever with shortness of breath.  EXAM: PORTABLE CHEST - 1 VIEW  COMPARISON:  CT ANGIO CHEST W/CM &/OR WO/CM dated 12/31/2013; DG CHEST 1V PORT dated 12/30/2013  FINDINGS: 1206 hr. Two views were obtained. There are lower lung volumes with stable patchy bibasilar pulmonary opacities based on the second view. No progressive airspace disease or significant pleural effusion is identified. The heart size and mediastinal contours are stable. Deformity of the proximal right humerus appears stable.  IMPRESSION: Stable patchy bibasilar airspace opacities compared with recent prior studies. No new findings demonstrated.   Electronically Signed   By: Camie Patience M.D.   On: 01/21/2014 12:37     ASSESSMENT / PLAN:  PULMONARY A: Respiratory distress COPD R/o early HCAP vs ARDS P:   Intubated 2/3 Goal SpO2 >92% BD's Vent bundle  CARDIOVASCULAR A: Septic shock Hypotension (hx HTN) Tachycardia P:  NS @ 125 decrease to 75 EGDT Levophed to map 60 (currently off) Follow  lactic acid ordered for 2/3 and 2/4 Hold home  anti-hypertensives (lopressor) Cortisol 16(but off pressor therefore no steroids, esp in bipolar individual)   RENAL A:   UTI AG metabolic acidosis, 15 Hx E. Coli urosepsis, ESBL - undertreated from last admission. +Ecoli this admit P:   Check lactic acid (4.8)->(0.9) Monitor BMET Abx as below  GASTROINTESTINAL A:   Hx GERD SUP P:   Protonix Npo lft  HEMATOLOGIC A:  Anemia, stable (asymptomatic), dvt prevention P:  Follow  CBC Sub  q heparin  INFECTIOUS A:  Suspected urosepsis, ESBL source- not treated fully +e coli this admit Febrile (104.2) Leukocytosis P:   Imipenem + vanco   ENDOCRINE A:   DMII (01/07/14 A1C = 8.1) Hyperglycemia R/o rel AI P:   Cont IVF CBG checks SSI - sensitive Dc metformin, lactic acid Lantus hold as npo   NEUROLOGIC A:  Acute encephalopathy Anxiety Bipolar Hx acute encephalopathy 2/2 urosepsis P:   Sedation per protocol  TODAY'S SUMMARY: Required intubation 2/3. + Ecoli this admit SS pending. Off pressor therefore no push for aline at this time.  40 minutes CC time.   [name] Minor ACNP Maryanna Shape PCCM Pager [phone] till 3 pm If no answer page [phone] 01/23/2014, 9:52 AM  Baltazar Apo, MD, PhD 01/23/2014, 3:00 PM [place] [facility] [phone] or if no answer [phone]

## 2014-01-23 NOTE — Progress Notes (Addendum)
Inpatient Diabetes Program Recommendations  AACE/ADA: New Consensus Statement on Inpatient Glycemic Control (2013)  Target Ranges:  Prepandial:   less than 140 mg/dL      Peak postprandial:   less than 180 mg/dL (1-2 hours)      Critically ill patients:  140 - 180 mg/dL   Reason for Visit: Hyperglycemia  Diabetes history: Type 2 Outpatient Diabetes medications: Metformin 1000mg  QD, Lantus 22 units QHS and Novolog s/s Current orders for Inpatient glycemic control: Novolog sensitive Q4 hours  Recommendations:  Please consider ICU Hyperglycemia Protocol. If not, please add Lantus 10 units QHS and Novolog 4 units Q4 hours for TF coverage.  Will follow. Thank you. Lorenda Peck, RD, LDN, CDE Inpatient Diabetes Coordinator [phone]

## 2014-01-23 NOTE — Progress Notes (Signed)
OT Cancellation Note  Patient Details Name: [name] MRN: [id number] DOB: [birth year]   Cancelled Treatment:    Reason Eval/Treat Not Completed: Medical issues which prohibited therapy.  Pt sedated/on vent.  Will check back.    Teesha Ohm 01/23/2014, 8:08 AM Lesle Chris, OTR/L [phone] 01/23/2014

## 2014-01-24 ENCOUNTER — Inpatient Hospital Stay (HOSPITAL_COMMUNITY): Payer: Medicare Other

## 2014-01-24 DIAGNOSIS — N390 Urinary tract infection, site not specified: Secondary | ICD-10-CM

## 2014-01-24 LAB — GLUCOSE, CAPILLARY
GLUCOSE-CAPILLARY: 222 mg/dL — AB (ref 70–99)
GLUCOSE-CAPILLARY: 203 mg/dL — AB (ref 70–99)
Glucose-Capillary: 185 mg/dL — ABNORMAL HIGH (ref 70–99)
Glucose-Capillary: 188 mg/dL — ABNORMAL HIGH (ref 70–99)
Glucose-Capillary: 223 mg/dL — ABNORMAL HIGH (ref 70–99)
Glucose-Capillary: 217 mg/dL — ABNORMAL HIGH (ref 70–99)

## 2014-01-24 LAB — CBC
HCT: 30.4 % — ABNORMAL LOW (ref 36.0–46.0)
Hemoglobin: 9.2 g/dL — ABNORMAL LOW (ref 12.0–15.0)
MCH: 28.1 pg (ref 26.0–34.0)
MCHC: 30.3 g/dL (ref 30.0–36.0)
MCV: 93 fL (ref 78.0–100.0)
Platelets: 146 10*3/uL — ABNORMAL LOW (ref 150–400)
RBC: 3.27 MIL/uL — AB (ref 3.87–5.11)
RDW: 15.7 % — AB (ref 11.5–15.5)
WBC: 10.2 10*3/uL (ref 4.0–10.5)

## 2014-01-24 LAB — BASIC METABOLIC PANEL
BUN: 5 mg/dL — AB (ref 6–23)
BUN: 6 mg/dL (ref 6–23)
CHLORIDE: 102 meq/L (ref 96–112)
CHLORIDE: 98 meq/L (ref 96–112)
CO2: 27 mEq/L (ref 19–32)
CO2: 29 meq/L (ref 19–32)
CREATININE: 0.44 mg/dL — AB (ref 0.50–1.10)
Calcium: 7.7 mg/dL — ABNORMAL LOW (ref 8.4–10.5)
Calcium: 7.6 mg/dL — ABNORMAL LOW (ref 8.4–10.5)
Creatinine, Ser: 0.42 mg/dL — ABNORMAL LOW (ref 0.50–1.10)
GFR calc Af Amer: >90 mL/min (ref >90)
GFR calc non Af Amer: >90 mL/min (ref >90)
GFR calc non Af Amer: >90 mL/min (ref >90)
GLUCOSE: 246 mg/dL — AB (ref 70–99)
Glucose, Bld: 269 mg/dL — ABNORMAL HIGH (ref 70–99)
POTASSIUM: 2.6 meq/L — AB (ref 3.7–5.3)
Potassium: 3.5 mEq/L — ABNORMAL LOW (ref 3.7–5.3)
SODIUM: 142 meq/L (ref 137–147)
Sodium: 139 mEq/L (ref 137–147)

## 2014-01-24 LAB — MAGNESIUM: Magnesium: 1.6 mg/dL (ref 1.5–2.5)

## 2014-01-24 LAB — PHOSPHORUS
PHOSPHORUS: 5.6 mg/dL — AB (ref 2.3–4.6)
Phosphorus: 1.9 mg/dL — ABNORMAL LOW (ref 2.3–4.6)

## 2014-01-24 LAB — VANCOMYCIN, TROUGH: Vancomycin Tr: 7.6 ug/mL — ABNORMAL LOW (ref 10.0–20.0)

## 2014-01-24 MED ORDER — POTASSIUM CHLORIDE 10 MEQ/50ML IV SOLN
INTRAVENOUS | Status: AC
  Administered 2014-01-24: 10 meq via INTRAVENOUS
  Filled 2014-01-24: qty 50

## 2014-01-24 MED ORDER — POTASSIUM CHLORIDE 10 MEQ/50ML IV SOLN
INTRAVENOUS | Status: AC
  Administered 2014-01-25: 10 meq via INTRAVENOUS
  Filled 2014-01-24: qty 50

## 2014-01-24 MED ORDER — VENLAFAXINE HCL 75 MG PO TABS
75.0000 mg | ORAL_TABLET | Freq: Two times a day (BID) | ORAL | Status: DC
  Administered 2014-01-24 – 2014-02-05 (×25): 75 mg
  Filled 2014-01-24 (×26): qty 1

## 2014-01-24 MED ORDER — FUROSEMIDE 10 MG/ML IJ SOLN
40.0000 mg | Freq: Two times a day (BID) | INTRAMUSCULAR | Status: AC
  Administered 2014-01-24 – 2014-01-25 (×2): 40 mg via INTRAVENOUS
  Filled 2014-01-24 (×2): qty 4

## 2014-01-24 MED ORDER — DEXMEDETOMIDINE HCL IN NACL 400 MCG/100ML IV SOLN
0.2000 ug/kg/h | INTRAVENOUS | Status: AC
  Administered 2014-01-24 (×2): 2 ug/kg/h via INTRAVENOUS
  Administered 2014-01-24: 2.002 ug/kg/h via INTRAVENOUS
  Administered 2014-01-24: 1.2 ug/kg/h via INTRAVENOUS
  Administered 2014-01-24: 1.6 ug/kg/h via INTRAVENOUS
  Administered 2014-01-24: 0.6 ug/kg/h via INTRAVENOUS
  Administered 2014-01-25 (×3): 2.6 ug/kg/h via INTRAVENOUS
  Administered 2014-01-25: 2.2 ug/kg/h via INTRAVENOUS
  Administered 2014-01-25: 2.4 ug/kg/h via INTRAVENOUS
  Administered 2014-01-25: 2 ug/kg/h via INTRAVENOUS
  Administered 2014-01-25: 2.6 ug/kg/h via INTRAVENOUS
  Administered 2014-01-25: 2.4 ug/kg/h via INTRAVENOUS
  Administered 2014-01-25: 2.6 ug/kg/h via INTRAVENOUS
  Administered 2014-01-25: 2.4 ug/kg/h via INTRAVENOUS
  Administered 2014-01-25: 2.6 ug/kg/h via INTRAVENOUS
  Administered 2014-01-25 (×2): 2.4 ug/kg/h via INTRAVENOUS
  Administered 2014-01-26: 2.6 ug/kg/h via INTRAVENOUS
  Filled 2014-01-24 (×25): qty 100

## 2014-01-24 MED ORDER — POTASSIUM CHLORIDE 10 MEQ/50ML IV SOLN
10.0000 meq | INTRAVENOUS | Status: AC
  Administered 2014-01-24 – 2014-01-25 (×6): 10 meq via INTRAVENOUS

## 2014-01-24 MED ORDER — HYDRALAZINE HCL 20 MG/ML IJ SOLN
10.0000 mg | INTRAMUSCULAR | Status: DC | PRN
  Administered 2014-01-24 – 2014-01-26 (×3): 20 mg via INTRAVENOUS
  Filled 2014-01-24: qty 2
  Filled 2014-01-24: qty 1
  Filled 2014-01-24: qty 2

## 2014-01-24 MED ORDER — SODIUM PHOSPHATE 3 MMOLE/ML IV SOLN
30.0000 mmol | Freq: Once | INTRAVENOUS | Status: AC
  Administered 2014-01-24: 30 mmol via INTRAVENOUS
  Filled 2014-01-24: qty 10

## 2014-01-24 MED ORDER — ROCURONIUM BROMIDE 50 MG/5ML IV SOLN
INTRAVENOUS | Status: AC
  Filled 2014-01-24: qty 2

## 2014-01-24 MED ORDER — MAGNESIUM SULFATE 40 MG/ML IJ SOLN
2.0000 g | Freq: Once | INTRAMUSCULAR | Status: AC
  Administered 2014-01-24: 2 g via INTRAVENOUS
  Filled 2014-01-24: qty 50

## 2014-01-24 MED ORDER — GABAPENTIN 250 MG/5ML PO SOLN
300.0000 mg | Freq: Three times a day (TID) | ORAL | Status: DC
  Administered 2014-01-24 – 2014-01-26 (×6): 300 mg
  Filled 2014-01-24 (×12): qty 6

## 2014-01-24 MED ORDER — LIDOCAINE HCL (CARDIAC) 20 MG/ML IV SOLN
INTRAVENOUS | Status: AC
  Filled 2014-01-24: qty 5

## 2014-01-24 MED ORDER — ETOMIDATE 2 MG/ML IV SOLN
INTRAVENOUS | Status: AC
  Administered 2014-01-24: 10 mg
  Filled 2014-01-24: qty 20

## 2014-01-24 MED ORDER — QUETIAPINE FUMARATE 100 MG PO TABS
100.0000 mg | ORAL_TABLET | Freq: Every day | ORAL | Status: DC
  Administered 2014-01-24 – 2014-01-25 (×2): 100 mg
  Filled 2014-01-24 (×3): qty 1

## 2014-01-24 MED ORDER — SUCCINYLCHOLINE CHLORIDE 20 MG/ML IJ SOLN
INTRAMUSCULAR | Status: AC
  Filled 2014-01-24: qty 1

## 2014-01-24 MED ORDER — SODIUM CHLORIDE 0.9 % IV SOLN
1500.0000 mg | Freq: Two times a day (BID) | INTRAVENOUS | Status: DC
  Administered 2014-01-24: 1500 mg via INTRAVENOUS
  Filled 2014-01-24 (×2): qty 1500

## 2014-01-24 MED ORDER — BUDESONIDE 0.25 MG/2ML IN SUSP
0.2500 mg | Freq: Two times a day (BID) | RESPIRATORY_TRACT | Status: DC
  Administered 2014-01-24 – 2014-02-05 (×24): 0.25 mg via RESPIRATORY_TRACT
  Filled 2014-01-24 (×47): qty 2

## 2014-01-24 MED ORDER — TRAZODONE HCL 50 MG PO TABS
50.0000 mg | ORAL_TABLET | Freq: Every day | ORAL | Status: DC
  Administered 2014-01-24 – 2014-01-25 (×2): 50 mg
  Filled 2014-01-24 (×3): qty 1

## 2014-01-24 MED ORDER — FENTANYL CITRATE 0.05 MG/ML IJ SOLN
25.0000 ug | INTRAMUSCULAR | Status: DC | PRN
  Administered 2014-01-24 – 2014-01-30 (×20): 100 ug via INTRAVENOUS
  Administered 2014-01-30: 50 ug via INTRAVENOUS
  Administered 2014-01-30 – 2014-01-31 (×6): 100 ug via INTRAVENOUS
  Filled 2014-01-24 (×27): qty 2

## 2014-01-24 NOTE — Procedures (Signed)
Intubation Procedure Note MARAI TEEHAN [id number] 1941-02-07  Procedure: Intubation Indications: Respiratory insufficiency  Procedure Details Consent: Risks of procedure as well as the alternatives and risks of each were explained to the (patient/caregiver).  Consent for procedure obtained. Time Out: Verified patient identification, verified procedure, site/side was marked, verified correct patient position, special equipment/implants available, medications/allergies/relevent history reviewed, required imaging and test results available.  Performed  Maximum sterile technique was used including antiseptics, cap, gloves, gown, hand hygiene, mask and sheet.  MAC and 3    Evaluation Hemodynamic Status: BP stable throughout; O2 sats: stable throughout Patient's Current Condition: stable Complications: No apparent complications Patient did tolerate procedure well. Chest X-ray ordered to verify placement.  CXR: pending.   Darlina Sicilian, NP 01/24/2014  Baltazar Apo, MD, PhD 01/24/2014, 2:10 PM [place] [facility] [phone] or if no answer [phone]

## 2014-01-24 NOTE — Progress Notes (Signed)
High bp - Ordered Prn hydralazine  No labs for tomorrow - ordered bmet, phos and mag  Dr. Brand Males, M.D., [facility].C.P Pulmonary and Critical Care Medicine Staff Physician [place] [facility] Pager: [phone], If no answer or between  15:00h - 7:00h: call [phone]  01/24/2014 8:53 PM

## 2014-01-24 NOTE — Progress Notes (Signed)
[facility] Progress Note Patient Name: [name] DOB: Jul 19, 1941 MRN: [id number]  Date of Service  01/24/2014   HPI/Events of Note  Hypophosphatemia   eICU Interventions  Phos replaced   Intervention Category Intermediate Interventions: Electrolyte abnormality - evaluation and management  DETERDING,ELIZABETH 01/24/2014, 5:37 AM

## 2014-01-24 NOTE — Progress Notes (Signed)
NUTRITION FOLLOW UP  Intervention:   - Keep TF of Vital High Protein at 30m/hr via OGT until phosphorus and potassium WNL. Discussed with nursing.  - Will continue to monitor   Nutrition Dx:   Inadequate oral intake related to inability to eat as evidenced by NPO - ongoing   Goal:   Enteral nutrition to provide 60-70% of estimated calorie needs (22-25 kcals/kg ideal body weight) and 100% of estimated protein needs, based on ASPEN guidelines for permissive underfeeding in critically ill obese individuals - not met, TF rate remains low due to refeeding risk   Monitor:   Weights, labs, TF advancement/tolerance, vent status   Assessment:   Admitted with fever, shortness of breath, and altered mental status[facility] and weighed 203 pounds. Intubated yesterday for respiratory failure.   2/4 - Received consult for TF management. Due to pt's low potassium, magnesium, and phosphorus and very poor intake last admission, pt at risk of refeeding syndrome. Will advance TF very slowly.   2/5 - Remains intubated. Per conversation with RN, pt had 1588mTF residuals last night and 15077mF residuals this morning. Magnesium WNL, however potassium and phosphorus remain low. Phosphorus down slightly compared to yesterday, likely due to refeeding. Getting 4 runs of KCl and IV sodium phosphate.   Patient is currently intubated on ventilator support.  MV: 7 L/min Temp (24hrs), Avg:99.8 F (37.7 C), Min:97.9 F (36.6 C), Max:101.7 F (38.7 C)  Propofol: off   Potassium  Date/Time Value Range Status  01/24/2014  4:50 AM 3.5* 3.7 - 5.3 mEq/L Final  01/23/2014  4:15 AM 3.3* 3.7 - 5.3 mEq/L Final  01/22/2014  4:22 AM 3.8  3.7 - 5.3 mEq/L Final     DELTA CHECK NOTED    Phosphorus  Date/Time Value Range Status  01/24/2014  4:50 AM 1.9* 2.3 - 4.6 mg/dL Final  01/23/2014  4:15 AM 2.0*  2.3 - 4.6 mg/dL Final    Magnesium  Date/Time Value Range Status  01/24/2014  4:50 AM 1.6  1.5 - 2.5 mg/dL Final  01/23/2014  4:15 AM 1.4* 1.5 - 2.5 mg/dL Final      Height: Ht Readings from Last 1 Encounters:  01/21/14 _0  (1.575 m)    Weight Status:   Wt Readings from Last 1 Encounters:  01/24/14 208 lb 5.4 oz (94.5 kg)  Admit wt:        180 lb (81.6 kg) Net I/Os: +4.6L  Re-estimated needs:  Kcal: 1568 Protein: 100-120g Fluid: >1.5L/day  Skin: +1 generalized edema, non-pitting RUE, RLE, LLE edema  Diet Order: NPO   Intake/Output Summary (Last 24 hours) at 01/24/14 0943 Last data filed at 01/24/14 0900  Gross per 24 hour  Intake 3986.2 ml  Output   2635 ml  Net 1351.2 ml    Last BM: PTA   Labs:   Recent Labs Lab 01/22/14 0422 01/23/14 0415 01/24/14 0450  NA 143 141 139  K 3.8 3.3* 3.5*  CL 103 105 102  CO2 _1 BUN 7 6 5*  CREATININE 0.52 0.40* 0.42*  CALCIUM 7.5* 7.6* 7.7*  MG  --  1.4* 1.6  PHOS  --  2.0* 1.9*  GLUCOSE 200* 208* 246*    CBG (last 3)   Recent Labs  01/23/14 2315 01/24/14 0339 01/24/14 0736  GLUCAP 185* 222* 188*  Scheduled Meds: . acetaminophen (TYLENOL) oral liquid 160 mg/5 mL  650 mg Oral Once  . antiseptic oral rinse  15 mL Mouth Rinse QID  . budesonide  0.25 mg Nebulization Q6H  . chlorhexidine  15 mL Mouth Rinse BID  . Chlorhexidine Gluconate Cloth  6 each Topical Q0600  . famotidine (PEPCID) IV  20 mg Intravenous Q12H  . feeding supplement (VITAL HIGH PROTEIN)  1,000 mL Per Tube Q24H  . heparin  5,000 Units Subcutaneous Q8H  . imipenem-cilastatin  500 mg Intravenous Q8H  . insulin aspart  0-9 Units Subcutaneous Q4H  . ipratropium-albuterol  3 mL Nebulization Q6H  . multivitamin  5 mL Per Tube Daily  . mupirocin ointment  1 application Nasal BID  . pantoprazole (PROTONIX) IV  40 mg Intravenous Daily  . sodium phosphate  Dextrose 5% IVPB  30 mmol Intravenous Once  . vancomycin  1,500 mg Intravenous  Q12H    Continuous Infusions: . sodium chloride 1,000 mL (01/23/14 2157)  . dexmedetomidine 0.6 mcg/kg/hr (01/24/14 0749)  . fentaNYL infusion INTRAVENOUS 200 mcg/hr (01/24/14 0341)  . norepinephrine (LEVOPHED) Adult infusion      Mikey College MS, RD, LDN [phone] Pager [phone] After Hours Pager

## 2014-01-24 NOTE — Progress Notes (Signed)
PT Cancellation Note  Patient Details Name: [name] MRN: [id number] DOB: 1940-12-31   Cancelled Treatment:    Reason Eval/Treat Not Completed: Medical issues which prohibited therapy-remains on vent.   [name] 01/24/2014, 7:06 AM Tresa Endo PT [phone]

## 2014-01-24 NOTE — Progress Notes (Signed)
  Recent Labs Lab 01/21/14 1139 01/22/14 0422 01/23/14 0415 01/24/14 0450 01/24/14 1730  NA 141 143 141 139 142  K 5.0 3.8 3.3* 3.5* 2.6*  CL 98 103 105 102 98  CO2 28 29 26 27 29   GLUCOSE 239* 200* 208* 246* 269*  BUN 9 7 6  5* 6  CREATININE 0.52 0.52 0.40* 0.42* 0.44*  CALCIUM 8.8 7.5* 7.6* 7.7* 7.6*  MG  --   --  1.4* 1.6  --   PHOS  --   --  2.0* 1.9* 5.6*    A Low K Low Mag  P KCl 70meq x 6 runs Mag sulfate 2gm IV x 1  Dr. Brand Males, M.D., [facility].C.P Pulmonary and Critical Care Medicine Staff Physician [place] [facility] Pager: [phone], If no answer or between  15:00h - 7:00h: call [phone]  01/24/2014 7:27 PM

## 2014-01-24 NOTE — Progress Notes (Signed)
ANTIBIOTIC CONSULT NOTE - FOLLOW UP  Pharmacy Consult for Vancomycin Indication: Sepsis  Allergies  Allergen Reactions  . Benzodiazepines Other (See Comments)    Mood dysregulation  (on MAR)  . Morphine And Related Other (See Comments)    Pain dysregulation Mood disruption  . Olanzapine     Hallucinations and disorientation  . Sulfamethoxazole-Trimethoprim Hives    Patient Measurements: Height: 5\' 2"  (157.5 cm) Weight: 205 lb 0.4 oz (93 kg) IBW/kg (Calculated) : 50.1 Adjusted Body Weight:   Vital Signs: Temp: 101.1 F (38.4 C) (02/05 0400) Temp src: Core (Comment) (02/04 1954) BP: 153/71 mmHg (02/05 0400) Pulse Rate: 101 (02/05 0400) Intake/Output from previous day: 02/04 0701 - 02/05 0700 In: 2972.2 [I.V.:1952.2; NG/GT:120; IV Piggyback:900] Out: 1945 [TKWIO:9735] Intake/Output from this shift: Total I/O In: 609 [I.V.:369; NG/GT:90; IV Piggyback:150] Out: 435 [Urine:435]  Labs:  Recent Labs  01/22/14 0422 01/23/14 0415 01/24/14 0450  WBC 16.6* 13.6* 10.2  HGB 9.8* 9.6* 9.2*  PLT 129* 122* 146*  CREATININE 0.52 0.40* 0.42*   Estimated Creatinine Clearance: 68.5 ml/min (by C-G formula based on Cr of 0.42).  Recent Labs  01/23/14 2330  VANCOTROUGH 7.6*     Microbiology: Recent Results (from the past 720 hour(s))  CULTURE, BLOOD (ROUTINE X 2)     Status: None   Collection Time    12/25/13  2:45 PM      Result Value Range Status   Specimen Description BLOOD HAND RIGHT   Final   Special Requests     Final   Value: BOTTLES DRAWN AEROBIC AND ANAEROBIC 10CCBLUE 9CCRED   Culture  Setup Time     Final   Value: 12/25/2013 20:23     Performed at [facility]   Culture     Final   Value: NO GROWTH 5 DAYS     Performed at [facility]   Report Status 12/31/2013 FINAL   Final  CULTURE, BLOOD (ROUTINE X 2)     Status: None   Collection Time    12/25/13  2:52 PM      Result Value Range Status   Specimen Description BLOOD HAND LEFT   Final    Special Requests BOTTLES DRAWN AEROBIC ONLY 10CC   Final   Culture  Setup Time     Final   Value: 12/25/2013 20:23     Performed at [facility]   Culture     Final   Value: NO GROWTH 5 DAYS     Performed at [facility]   Report Status 12/31/2013 FINAL   Final  URINE CULTURE     Status: None   Collection Time    12/25/13  3:19 PM      Result Value Range Status   Specimen Description URINE, CATHETERIZED   Final   Special Requests NONE   Final   Culture  Setup Time     Final   Value: 12/25/2013 20:28     Performed at [place]     Final   Value: >=100,000 COLONIES/ML     Performed at [facility]   Culture     Final   Value: ESCHERICHIA COLI     Performed at [facility]   Report Status 12/28/2013 FINAL   Final   Organism ID, Bacteria ESCHERICHIA COLI   Final  MRSA PCR SCREENING     Status: Abnormal   Collection Time    12/25/13  7:38  PM      Result Value Range Status   MRSA by PCR POSITIVE (*) NEGATIVE Final   Comment:            The GeneXpert MRSA Assay (FDA     approved for NASAL specimens     only), is one component of a     comprehensive MRSA colonization     surveillance program. It is not     intended to diagnose MRSA     infection nor to guide or     monitor treatment for     MRSA infections.     RESULT CALLED TO, READ BACK BY AND VERIFIED WITH:     Louretta Parma RN [id number] 12/25/13 A BROWNING  CLOSTRIDIUM DIFFICILE BY PCR     Status: None   Collection Time    12/26/13  2:34 PM      Result Value Range Status   C difficile by pcr NEGATIVE  NEGATIVE Final  URINE CULTURE     Status: None   Collection Time    12/31/13  2:48 PM      Result Value Range Status   Specimen Description URINE, RANDOM   Final   Special Requests NONE   Final   Culture  Setup Time     Final   Value: 12/31/2013 22:02     Performed at [place]     Final   Value: >=100,000 COLONIES/ML     Performed at  [facility]   Culture     Final   Value: ESCHERICHIA COLI     Note: Confirmed Extended Spectrum Beta-Lactamase Producer (ESBL)     Performed at [facility]   Report Status 01/02/2014 FINAL   Final   Organism ID, Bacteria ESCHERICHIA COLI   Final  CULTURE, BLOOD (ROUTINE X 2)     Status: None   Collection Time    01/21/14 11:39 AM      Result Value Range Status   Specimen Description BLOOD RIGHT ANTECUBITAL   Final   Special Requests BOTTLES DRAWN AEROBIC AND ANAEROBIC 5CC   Final   Culture  Setup Time     Final   Value: 01/21/2014 14:05     Performed at [facility]   Culture     Final   Value:        BLOOD CULTURE RECEIVED NO GROWTH TO DATE CULTURE WILL BE HELD FOR 5 DAYS BEFORE ISSUING A FINAL NEGATIVE REPORT     Performed at [facility]   Report Status PENDING   Incomplete  CULTURE, BLOOD (ROUTINE X 2)     Status: None   Collection Time    01/21/14 12:00 PM      Result Value Range Status   Specimen Description BLOOD LEFT SHOULDER   Final   Special Requests BOTTLES DRAWN AEROBIC AND ANAEROBIC 5ML   Final   Culture  Setup Time     Final   Value: 01/21/2014 14:07     Performed at [facility]   Culture     Final   Value:        BLOOD CULTURE RECEIVED NO GROWTH TO DATE CULTURE WILL BE HELD FOR 5 DAYS BEFORE ISSUING A FINAL NEGATIVE REPORT     Performed at [facility]   Report Status PENDING   Incomplete  URINE CULTURE     Status: None   Collection Time    01/21/14 12:24  PM      Result Value Range Status   Specimen Description URINE, CATHETERIZED   Final   Special Requests Normal   Final   Culture  Setup Time     Final   Value: 01/21/2014 18:46     Performed at [place]     Final   Value: >=100,000 COLONIES/ML     Performed at [facility]   Culture     Final   Value: ESCHERICHIA COLI     Note: Confirmed Extended Spectrum Beta-Lactamase Producer (ESBL) CRITICAL RESULT CALLED TO, READ  BACK BY AND VERIFIED WITH: PAM W. AT 12:06PM ON 01/23/2014 HAJAM     Performed at [facility]   Report Status 01/23/2014 FINAL   Final   Organism ID, Bacteria ESCHERICHIA COLI   Final  MRSA PCR SCREENING     Status: Abnormal   Collection Time    01/21/14  5:25 PM      Result Value Range Status   MRSA by PCR POSITIVE (*) NEGATIVE Final   Comment:            The GeneXpert MRSA Assay (FDA     approved for NASAL specimens     only), is one component of a     comprehensive MRSA colonization     surveillance program. It is not     intended to diagnose MRSA     infection nor to guide or     monitor treatment for     MRSA infections.     RESULT CALLED TO, READ BACK BY AND VERIFIED WITH:     S.SHAFFER RN AT 1857 ON [id number] BY C.BONGEL  RESPIRATORY VIRUS PANEL     Status: None   Collection Time    01/22/14  1:35 AM      Result Value Range Status   Source - RVPAN NOSE   Final   Respiratory Syncytial Virus A NOT DETECTED   Final   Respiratory Syncytial Virus B NOT DETECTED   Final   Influenza A NOT DETECTED   Final   Influenza B NOT DETECTED   Final   Parainfluenza 1 NOT DETECTED   Final   Parainfluenza 2 NOT DETECTED   Final   Parainfluenza 3 NOT DETECTED   Final   Metapneumovirus NOT DETECTED   Final   Rhinovirus NOT DETECTED   Final   Adenovirus NOT DETECTED   Final   Influenza A H1 NOT DETECTED   Final   Influenza A H3 NOT DETECTED   Final   Comment: (NOTE)           Normal Reference Range for each Analyte: NOT DETECTED     Testing performed using the Luminex xTAG Respiratory Viral Panel test     kit.     This test was developed and its performance characteristics determined     by [facility]. It has not been cleared or approved by the [place]     Food and Drug Administration. This test is used for clinical purposes.     It should not be regarded as investigational or for research. This     laboratory is certified under the [place] (CLIA) as qualified to perform high complexity     clinical laboratory testing.     Performed at [facility]  Frequency Ordered Stop   01/24/14 0800  vancomycin (VANCOCIN) 1,500 mg in sodium chloride 0.9 % 500 mL IVPB     1,500 mg 250 mL/hr over 120 Minutes Intravenous Every 12 hours 01/24/14 0549     01/22/14 0000  vancomycin (VANCOCIN) IVPB 1000 mg/200 mL premix  Status:  Discontinued     1,000 mg 200 mL/hr over 60 Minutes Intravenous Every 12 hours 01/21/14 1343 01/24/14 0551   01/21/14 2000  imipenem-cilastatin (PRIMAXIN) 500 mg in sodium chloride 0.9 % 100 mL IVPB     500 mg 200 mL/hr over 30 Minutes Intravenous 3 times per day 01/21/14 1625     01/21/14 1800  imipenem-cilastatin (PRIMAXIN) 500 mg in sodium chloride 0.9 % 100 mL IVPB  Status:  Discontinued     500 mg 200 mL/hr over 30 Minutes Intravenous Every 6 hours 01/21/14 1343 01/21/14 1625   01/21/14 1245  imipenem-cilastatin (PRIMAXIN) 500 mg in sodium chloride 0.9 % 100 mL IVPB     500 mg 200 mL/hr over 30 Minutes Intravenous NOW 01/21/14 1232 01/21/14 1347   01/21/14 1130  ceFEPIme (MAXIPIME) 2 g in dextrose 5 % 50 mL IVPB     2 g 100 mL/hr over 30 Minutes Intravenous  Once 01/21/14 1125 01/21/14 1347   01/21/14 1130  vancomycin (VANCOCIN) IVPB 1000 mg/200 mL premix     1,000 mg 200 mL/hr over 60 Minutes Intravenous  Once 01/21/14 1125 01/21/14 1347      Assessment: Patient with low vancomycin level.    Goal of Therapy:  Vancomycin trough level 15-20 mcg/ml  Plan:  Measure antibiotic drug levels at steady state Follow up culture results Change vancomycin to $RemoveBefor'1500mg'GZihCdOukvDS$  iv q12hr, next dose at 0800  Tyler Deis, [facility] [place] 01/24/2014,5:52 AM

## 2014-01-24 NOTE — Progress Notes (Signed)
cxr - et tube 1cm above carina  Plan Retract et tube by 1cm   Dr. Brand Males, M.D., [facility].C.P Pulmonary and Critical Care Medicine Staff Physician [place] [facility] Pager: [phone], If no answer or between  15:00h - 7:00h: call [phone]  01/24/2014 4:04 PM

## 2014-01-24 NOTE — Progress Notes (Signed)
Narcotic Waste  Wasted 66ml of fentanyl (257ml bag)  Wasted with Erich Montane, RN

## 2014-01-24 NOTE — Progress Notes (Signed)
Name: [name] MRN: [id number] DOB: 03-26-1941    ADMISSION DATE:  01/21/2014 CONSULTATION DATE:  01/21/14  REFERRING MD :  Gabriel Cirri PRIMARY SERVICE: PCCM  CHIEF COMPLAINT:  Fever, hypoxia  BRIEF PATIENT DESCRIPTION: 73 yo resident of [facility] found by staff with fever and hypoxia. Taken to ED on NRB 100% with improvement. PCCM asked to see patient for respiratory needs and hypotension refractory to IVF. Recent admission to hospital 12/25/13 for ESBL E. Coli>>Urosepsis.  SIGNIFICANT EVENTS / STUDIES:  12/31/13 - CT angio chest  - Emphysema. Right consolidation. 2 nodules in right apex (f/u CT 4-6 weeks recommended) 2/2 >> Admitted to PCCM, LIJ placed, shock 2/3 intubated for resp distress  LINES / TUBES: PIV 2/2>>> LIJ 2/2>>> Foley 2/2>>> 2/3 OTT>>  CULTURES: Blood 2/2>>> Urine 2/2>>> (recent ESBL E. Coli isolated 12/31/13)>>e coli>> ESBL positive, sens to zosyn and imipenem resp virus 2/2>>negative  ANTIBIOTICS: Vancomycin 2/2>>> 2/5 Primaxin 2/2>>>  HISTORY OF PRESENT ILLNESS:  73 yo caucasian female resident of [facility] with PMH cervical cancer, DM, HTN, anxiety, COPD, bipolar and recent admission 12/25/13-01/01/14 to hospital with acute encephalopathy due to urosepsis due to E.Coli ESBL. Treated with cefepime narrowed to cipro for 10 days total (ESBL resistant to both). Records report that staff went to check on patient and she was found to be febrile with low O2 sats. Patient was put on non-rebreather @100 % with some anxiety but with some resolution after O2 sats improved. Brought to [facility] ED for workup of possible sepsis.  SUBJECTIVE:   Sedated on vent Was changed to PCV last night Pressors off  VITAL SIGNS: Temp:  [97.9 F (36.6 C)-101.7 F (38.7 C)] 101.7 F (38.7 C) (02/05 0600) Pulse Rate:  [80-107] 96 (02/05 0600) Resp:  [12-27] 15 (02/05 0600) BP: (118-177)/(54-97) 177/76 mmHg (02/05 0600) SpO2:  [94 %-100 %] 94 % (02/05 0929) FiO2 (%):  [40 %-50  %] 40 % (02/05 0929) Weight:  [94.5 kg (208 lb 5.4 oz)] 94.5 kg (208 lb 5.4 oz) (02/05 0601) HEMODYNAMICS:   VENTILATOR SETTINGS: Vent Mode:  [-] PCV FiO2 (%):  [40 %-50 %] 40 % Set Rate:  [14 bmp-18 bmp] 18 bmp Vt Set:  [400 mL] 400 mL PEEP:  [5 cmH20] 5 cmH20 Plateau Pressure:  [7 cmH20-25 cmH20] 12 cmH20 INTAKE / OUTPUT: Intake/Output     02/04 0701 - 02/05 0700 02/05 0701 - 02/06 0700   I.V. (mL/kg) 2336.2 (24.7) 102 (1.1)   Other     NG/GT 120    IV Piggyback 1250 500   Total Intake(mL/kg) 3706.2 (39.2) 602 (6.4)   Urine (mL/kg/hr) 2835 (1.3) 375 (1.3)   Emesis/NG output     Total Output 2835 375   Net +871.2 +227          PHYSICAL EXAMINATION: General appearance -sedated on vent Mental status - woke and moved purposefully on WUA, did not follow commands Eyes - PERRL Neck - supple, no significant adenopathy, LIJ in place Chest - distant breath sounds bilaterally Heart - normal rate, regular rhythm, normal S1, S2, no murmurs, rubs, clicks or gallops Abdomen - soft, nontender, nondistended, no masses or organomegaly Neurological -sedated Musculoskeletal - no joint tenderness, deformity or swelling Extremities - peripheral pulses normal, no pedal edema, no clubbing or cyanosis Skin - normal coloration and turgor, no rashes, no suspicious skin lesions noted   LABS:  CBC  Recent Labs Lab 01/22/14 0422 01/23/14 0415 01/24/14 0450  WBC 16.6* 13.6* 10.2  HGB 9.8* 9.6* 9.2*  HCT 31.6* 31.1* 30.4*  PLT 129* 122* 146*   Coag's  Recent Labs Lab 01/21/14 1139  INR 0.92   BMET  Recent Labs Lab 01/22/14 0422 01/23/14 0415 01/24/14 0450  NA 143 141 139  K 3.8 3.3* 3.5*  CL 103 105 102  CO2 29 26 27   BUN 7 6 5*  CREATININE 0.52 0.40* 0.42*  GLUCOSE 200* 208* 246*   Electrolytes  Recent Labs Lab 01/22/14 0422 01/23/14 0415 01/24/14 0450  CALCIUM 7.5* 7.6* 7.7*  MG  --  1.4* 1.6  PHOS  --  2.0* 1.9*   Sepsis Markers  Recent Labs Lab  01/21/14 1640 01/21/14 1820 01/22/14 0420 01/22/14 1100 01/23/14 0415 01/23/14 0645  LATICACIDVEN 4.8*  --   --  1.0  --  0.9  PROCALCITON  --  2.20 1.71  --  0.68  --    ABG  Recent Labs Lab 01/22/14 0101 01/22/14 1815 01/23/14 0352  PHART 7.374 7.307* 7.361  PCO2ART 51.5* 54.8* 46.3*  PO2ART 90.2 293.0* 70.7*   Liver Enzymes  Recent Labs Lab 01/21/14 1139  AST 30  ALT 14  ALKPHOS 57  BILITOT 0.2*  ALBUMIN 3.2*   Cardiac Enzymes No results found for this basename: TROPONINI, PROBNP,  in the last 168 hours Glucose  Recent Labs Lab 01/23/14 1128 01/23/14 1547 01/23/14 1938 01/23/14 2315 01/24/14 0339 01/24/14 0736  GLUCAP 198* 184* 168* 185* 222* 188*    Imaging Dg Chest Port 1 View  01/24/2014   CLINICAL DATA:  Endotracheal tube positioned  EXAM: PORTABLE CHEST - 1 VIEW  COMPARISON:  January 23, 2014  FINDINGS: The heart size and mediastinal contours are stable. The heart size is enlarged. Endotracheal tube, left jugular central venous line, nasogastric tube are identified in good position without change. Diffuse patchy opacity are identified throughout bilateral lungs minimally worse compared to prior exam. There are bilateral pleural effusions. Consolidation of bilateral lung bases are stable. The visualized skeletal structures are stable.  IMPRESSION: Congestive heart failure slightly worse compared to prior exam. Bilateral pleural effusions. Persistent consolidation of both lung bases.  Life supporting devices in good position without interval change.   Electronically Signed   By: Abelardo Diesel M.D.   On: 01/24/2014 07:17   Portable Chest Xray In Am  01/23/2014   CLINICAL DATA:  Followup pneumonia  EXAM: PORTABLE CHEST - 1 VIEW  COMPARISON:  Chest x-ray from yesterday  FINDINGS: Endotracheal tube ends in the mid lower thoracic trachea, 2.3 cm above the carina. New orogastric tube, tip in the proximal stomach. IVC filter noted. Left IJ catheter in stable position.   Cardiomegaly which is stable from prior. Dense bibasilar opacities. The left mid lung is more densely consolidated today. Interstitial markings are diffusely coarsened, with cephalized blood flow. There are probable small bilateral pleural effusions. No pneumothorax.  IMPRESSION: 1. More extensive bilateral lung consolidation. 2. Suspect edema superimposed on the patient's pneumonia. 3. Tubes and lines are in good position, including the new orogastric tube.   Electronically Signed   By: Jorje Guild M.D.   On: 01/23/2014 06:44   Dg Chest Port 1 View  01/22/2014   CLINICAL DATA:  Intubation.  EXAM: PORTABLE CHEST - 1 VIEW  COMPARISON:  DG CHEST 1V PORT dated 01/22/2014  FINDINGS: Endotracheal tube 6 mm above the carina, proximal repositioning may prove useful. Left IJ line in good anatomic in stable position. Poor inspiration with dense bilateral pulmonary infiltrates .  Small pleural effusion on the left. No pneumothorax. Heart size normal.  IMPRESSION: 1. Interval intubation. Endotracheal tube 6 mm above the carina, proximal repositioning may prove useful. Left IJ line in stable position.  2. Dense bilateral pulmonary infiltrates.  Poor inspiration.  3. Small left pleural effusion.  These results will be called to the ordering clinician or representative by the Radiologist Assistant, and communication documented in the PACS Dashboard.   Electronically Signed   By: Marcello Moores  Register   On: 01/22/2014 17:26     ASSESSMENT / PLAN:  PULMONARY A: Respiratory distress COPD R/o early HCAP vs ARDS vs cardiogenic edema; suspect ALI P:   Intubated 2/3 Goal SpO2 >92% BD's > duonebs + pulmicort bid Vent bundle Diuresis when BP can tolerate to goal CVP 5-7  CARDIOVASCULAR A: Septic shock Hypotension (hx HTN) Tachycardia P:  KVO IVF's, EGDT completed and pressors off Hold home anti-hypertensives (lopressor) Cortisol 16 but off pressor therefore no steroids, espec in bipolar individual  RENAL A:   E  coli ESBL UTI AG metabolic acidosis 15 resolved Hx E. Coli urosepsis, ESBL - undertreated from last admission.  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  VDRF w infiltrates. Now off pressors will need some diuresis and to be back on her psych meds before good candidate for extubation  40 minutes CC time.    Baltazar Apo, MD, PhD 01/24/2014, 10:07 AM [facility] [phone] or if no answer [phone]

## 2014-01-24 NOTE — Progress Notes (Signed)
OT Cancellation Note  Patient Details Name: ZNYA ALBINO MRN: [id number] DOB: [birth year]   Cancelled Treatment:    Reason Eval/Treat Not Completed: Medical issues which prohibited therapy pt remains on vent.  Will check back.   Canda Podgorski 01/24/2014, 7:13 AM Lesle Chris, OTR/L [phone] 01/24/2014

## 2014-01-25 ENCOUNTER — Inpatient Hospital Stay (HOSPITAL_COMMUNITY): Payer: Medicare Other

## 2014-01-25 LAB — BASIC METABOLIC PANEL
BUN: 6 mg/dL (ref 6–23)
BUN: 10 mg/dL (ref 6–23)
CHLORIDE: 100 meq/L (ref 96–112)
CO2: 31 meq/L (ref 19–32)
CO2: 31 meq/L (ref 19–32)
CREATININE: 0.43 mg/dL — AB (ref 0.50–1.10)
Calcium: 7.8 mg/dL — ABNORMAL LOW (ref 8.4–10.5)
Calcium: 8.1 mg/dL — ABNORMAL LOW (ref 8.4–10.5)
Chloride: 97 mEq/L (ref 96–112)
Creatinine, Ser: 0.42 mg/dL — ABNORMAL LOW (ref 0.50–1.10)
GFR calc Af Amer: >90 mL/min (ref >90)
GFR calc Af Amer: >90 mL/min (ref >90)
GFR calc non Af Amer: >90 mL/min (ref >90)
GFR calc non Af Amer: >90 mL/min (ref >90)
GLUCOSE: 276 mg/dL — AB (ref 70–99)
GLUCOSE: 169 mg/dL — AB (ref 70–99)
Potassium: 2.8 mEq/L — CL (ref 3.7–5.3)
Potassium: 3.1 mEq/L — ABNORMAL LOW (ref 3.7–5.3)
Sodium: 140 mEq/L (ref 137–147)
Sodium: 141 mEq/L (ref 137–147)

## 2014-01-25 LAB — GLUCOSE, CAPILLARY
GLUCOSE-CAPILLARY: 240 mg/dL — AB (ref 70–99)
GLUCOSE-CAPILLARY: 234 mg/dL — AB (ref 70–99)
GLUCOSE-CAPILLARY: 232 mg/dL — AB (ref 70–99)
GLUCOSE-CAPILLARY: 259 mg/dL — AB (ref 70–99)
Glucose-Capillary: 192 mg/dL — ABNORMAL HIGH (ref 70–99)
Glucose-Capillary: 127 mg/dL — ABNORMAL HIGH (ref 70–99)

## 2014-01-25 LAB — CBC
HCT: 29.4 % — ABNORMAL LOW (ref 36.0–46.0)
Hemoglobin: 9.4 g/dL — ABNORMAL LOW (ref 12.0–15.0)
MCH: 28.7 pg (ref 26.0–34.0)
MCHC: 32 g/dL (ref 30.0–36.0)
MCV: 89.9 fL (ref 78.0–100.0)
PLATELETS: 182 10*3/uL (ref 150–400)
RBC: 3.27 MIL/uL — AB (ref 3.87–5.11)
RDW: 15.4 % (ref 11.5–15.5)
WBC: 6.7 10*3/uL (ref 4.0–10.5)

## 2014-01-25 LAB — BLOOD GAS, ARTERIAL
Acid-Base Excess: 6.6 mmol/L — ABNORMAL HIGH (ref 0.0–2.0)
Bicarbonate: 30.6 mEq/L — ABNORMAL HIGH (ref 20.0–24.0)
DRAWN BY: 308601
FIO2: 0.4 %
LHR: 16 {breaths}/min
O2 Saturation: 94.3 %
PEEP: 5 cmH2O
PH ART: 7.47 — AB (ref 7.350–7.450)
Patient temperature: 98.6
TCO2: 28 mmol/L (ref 0–100)
VT: 500 mL
pCO2 arterial: 42.6 mmHg (ref 35.0–45.0)
pO2, Arterial: 67.5 mmHg — ABNORMAL LOW (ref 80.0–100.0)

## 2014-01-25 LAB — MAGNESIUM: Magnesium: 1.8 mg/dL (ref 1.5–2.5)

## 2014-01-25 LAB — PHOSPHORUS
PHOSPHORUS: 1.9 mg/dL — AB (ref 2.3–4.6)
Phosphorus: 3.1 mg/dL (ref 2.3–4.6)

## 2014-01-25 MED ORDER — FUROSEMIDE 10 MG/ML IJ SOLN
40.0000 mg | Freq: Two times a day (BID) | INTRAMUSCULAR | Status: AC
  Administered 2014-01-25 (×2): 40 mg via INTRAVENOUS
  Filled 2014-01-25 (×2): qty 4

## 2014-01-25 MED ORDER — SODIUM CHLORIDE 0.9 % IV SOLN
1.0000 g | Freq: Once | INTRAVENOUS | Status: AC
  Administered 2014-01-25: 1 g via INTRAVENOUS
  Filled 2014-01-25: qty 10

## 2014-01-25 MED ORDER — POTASSIUM CHLORIDE 10 MEQ/50ML IV SOLN
10.0000 meq | INTRAVENOUS | Status: AC
  Administered 2014-01-25 (×6): 10 meq via INTRAVENOUS
  Filled 2014-01-25 (×6): qty 50

## 2014-01-25 MED ORDER — POTASSIUM CHLORIDE 20 MEQ/15ML (10%) PO LIQD
40.0000 meq | Freq: Two times a day (BID) | ORAL | Status: AC
  Administered 2014-01-25 (×2): 40 meq via ORAL
  Filled 2014-01-25 (×2): qty 30

## 2014-01-25 MED ORDER — INSULIN ASPART 100 UNIT/ML ~~LOC~~ SOLN
0.0000 [IU] | SUBCUTANEOUS | Status: DC
  Administered 2014-01-25: 11 [IU] via SUBCUTANEOUS
  Administered 2014-01-25: 7 [IU] via SUBCUTANEOUS
  Administered 2014-01-26: 3 [IU] via SUBCUTANEOUS
  Administered 2014-01-26 (×2): 4 [IU] via SUBCUTANEOUS
  Administered 2014-01-26: 3 [IU] via SUBCUTANEOUS
  Administered 2014-01-26 – 2014-01-27 (×4): 7 [IU] via SUBCUTANEOUS
  Administered 2014-01-27: 3 [IU] via SUBCUTANEOUS
  Administered 2014-01-27: 4 [IU] via SUBCUTANEOUS
  Administered 2014-01-27 – 2014-01-28 (×4): 7 [IU] via SUBCUTANEOUS
  Administered 2014-01-28: 11 [IU] via SUBCUTANEOUS
  Administered 2014-01-28: 7 [IU] via SUBCUTANEOUS
  Administered 2014-01-28 (×3): 4 [IU] via SUBCUTANEOUS
  Administered 2014-01-29: 7 [IU] via SUBCUTANEOUS
  Administered 2014-01-29 (×2): 4 [IU] via SUBCUTANEOUS
  Administered 2014-01-29 – 2014-01-30 (×3): 3 [IU] via SUBCUTANEOUS
  Administered 2014-01-30: 4 [IU] via SUBCUTANEOUS
  Administered 2014-01-30 (×2): 3 [IU] via SUBCUTANEOUS
  Administered 2014-01-30 – 2014-01-31 (×4): 4 [IU] via SUBCUTANEOUS
  Administered 2014-01-31: 7 [IU] via SUBCUTANEOUS
  Administered 2014-01-31 (×2): 11 [IU] via SUBCUTANEOUS
  Administered 2014-01-31: 4 [IU] via SUBCUTANEOUS
  Administered 2014-02-01: 11 [IU] via SUBCUTANEOUS
  Administered 2014-02-01 (×2): 7 [IU] via SUBCUTANEOUS
  Administered 2014-02-01: 4 [IU] via SUBCUTANEOUS
  Administered 2014-02-01: 7 [IU] via SUBCUTANEOUS
  Administered 2014-02-02: 3 [IU] via SUBCUTANEOUS
  Administered 2014-02-02: 7 [IU] via SUBCUTANEOUS
  Administered 2014-02-02: 5 [IU] via SUBCUTANEOUS

## 2014-01-25 MED ORDER — VITAL HIGH PROTEIN PO LIQD
1000.0000 mL | ORAL | Status: DC
  Administered 2014-01-25 – 2014-01-31 (×7): 1000 mL
  Filled 2014-01-25 (×7): qty 1000

## 2014-01-25 MED ORDER — PRO-STAT SUGAR FREE PO LIQD
30.0000 mL | Freq: Four times a day (QID) | ORAL | Status: DC
  Administered 2014-01-25 – 2014-02-01 (×28): 30 mL
  Filled 2014-01-25 (×35): qty 30

## 2014-01-25 MED ORDER — SODIUM PHOSPHATE 3 MMOLE/ML IV SOLN
30.0000 mmol | Freq: Once | INTRAVENOUS | Status: AC
  Administered 2014-01-25: 30 mmol via INTRAVENOUS
  Filled 2014-01-25: qty 10

## 2014-01-25 NOTE — Progress Notes (Signed)
OT Cancellation Note and Discharge  Patient Details Name: [name] MRN: [id number] DOB: 06-Mar-1941   Cancelled Treatment:    Reason Eval/Treat Not Completed: Other (comment)  Pt remains on vent.  Please reorder when pt is extubated.  Thank you!    Shameika Speelman 01/25/2014, 8:03 AM Lesle Chris, OTR/L [phone] 01/25/2014

## 2014-01-25 NOTE — Progress Notes (Signed)
  Comments:  [id number] Rosana Hoes RN, BSN, [place], [phone] Chart reviewed for update of needs and condition. patient self extubated self on [phone] could not tolerate bipap reintubated and iv sedation.

## 2014-01-25 NOTE — Progress Notes (Signed)
NUTRITION FOLLOW UP  Intervention:   - Increase TF of Vital High Protein to 26m/hr via OGT with Prostat 370mQID. This will provide 1120 calories, 123g protein, and 60243mree water and meet 60% estimated calorie needs and 100% estimated protein needs.  - Recommend close monitoring and replacement of refeeding labs - potassium, phosphorus, and magnesium  - Will continue to monitor   Nutrition Dx:   Inadequate oral intake related to inability to eat as evidenced by NPO - ongoing   Goal:   Enteral nutrition to provide 60-70% of estimated calorie needs (22-25 kcals/kg ideal body weight) and 100% of estimated protein needs, based on ASPEN guidelines for permissive underfeeding in critically ill obese individuals - not met, TF rate remains low due to refeeding risk   Monitor:   Weights, labs, TF advancement/tolerance, vent status   Assessment:   Admitted with fever, shortness of breath, and altered mental status[facility] very poorly [facility] and weighed 203 pounds. Intubated yesterday for respiratory failure.   2/4 - Received consult for TF management. Due to pt's low potassium, magnesium, and phosphorus and very poor intake last admission, pt at risk of refeeding syndrome. Will advance TF very slowly.   2/5 - Remains intubated. Per conversation with RN, pt had 150m73m residuals last night and 150ml55mresiduals this morning. Magnesium WNL, however potassium and phosphorus remain low. Phosphorus down slightly compared to yesterday, likely due to refeeding. Getting 4 runs of KCl and IV sodium phosphate.   2/6 - Remains intubated. Started on TF of Vital High Protein 2/4 via OGT at 20ml/29mdid not advance due to refeeding risk. Potassium and phosphorus remain low. Discussed with pharmacist - pt getting aggressive replacement, IV and oral potassium replacement and IV phosphorus replacement -  expect potassium and phosphorus to improve. Pt's weight up 17 pounds since admission. Per conversation with RN, pt tolerating TF well with only 70ml r44muals this morning.   Patient is currently intubated on ventilator support.  MV: 12 L/min Temp (24hrs), Avg:100.4 F (38 C), Min:99.7 F (37.6 C), Max:100.9 F (38.3 C)  Propofol: off    Potassium  Date/Time Value Range Status  01/25/2014  3:54 AM 2.8* 3.7 - 5.3 mEq/L Final     CRITICAL RESULT CALLED TO, READ BACK BY AND VERIFIED WITH:     AYERS,C/2W _0  ON 01/25/14 BY KARCZEWSKI,S.  01/24/2014  5:30 PM 2.6* 3.7 - 5.3 mEq/L Final     CRITICAL RESULT CALLED TO, READ BACK BY AND VERIFIED WITH:     N RICHARDSON RN [phone] 01/24/14 A NAVARRO  01/24/2014  4:50 AM 3.5* 3.7 - 5.3 mEq/L Final    Phosphorus  Date/Time Value Range Status  01/25/2014  3:54 AM 1.9* 2.3 - 4.6 mg/dL Final  01/24/2014  5:30 PM 5.6* 2.3 - 4.6 mg/dL Final  01/24/2014  4:50 AM 1.9* 2.3 - 4.6 mg/dL Final    Magnesium  Date/Time Value Range Status  01/25/2014  3:54 AM 1.8  1.5 - 2.5 mg/dL Final  01/24/2014  4:50 AM 1.6  1.5 - 2.5 mg/dL Final  01/23/2014  4:15 AM 1.4* 1.5 - 2.5 mg/dL Final      Height: Ht Readings from Last 1 Encounters:  01/21/14 _1  (1.575 m)    Weight Status:   Wt Readings from Last 1 Encounters:  01/25/14 197 lb 15.6 oz (89.8 kg)  Admit wt:        180 lb (81.6 kg) Net I/Os: 1.4L  Re-estimated needs:  Kcal: 1854 Protein: 100-120g Fluid: >1.8L/day  Skin: +1 generalized edema, non-pitting RUE, +1 RLE, LLE edema  Diet Order: NPO   Intake/Output Summary (Last 24 hours) at 01/25/14 1008 Last data filed at 01/25/14 0800  Gross per 24 hour  Intake 2663.94 ml  Output   8355 ml  Net -5691.06 ml    Last BM: PTA   Labs:   Recent Labs Lab 01/23/14 0415 01/24/14 0450 01/24/14 1730 01/25/14 0354  NA 141 139 142 140  K 3.3* 3.5* 2.6* 2.8*  CL 105 102 98 97  CO2 _0 BUN 6 5* 6 6  CREATININE 0.40* 0.42* 0.44* 0.42*  CALCIUM  7.6* 7.7* 7.6* 7.8*  MG 1.4* 1.6  --  1.8  PHOS 2.0* 1.9* 5.6* 1.9*  GLUCOSE 208* 246* 269* 276*    CBG (last 3)   Recent Labs  01/24/14 2353 01/25/14 0336 01/25/14 0802  GLUCAP 192* 240* 234*    Scheduled Meds: . acetaminophen (TYLENOL) oral liquid 160 mg/5 mL  650 mg Oral Once  . antiseptic oral rinse  15 mL Mouth Rinse QID  . budesonide  0.25 mg Nebulization BID  . calcium gluconate  1 g Intravenous Once  . chlorhexidine  15 mL Mouth Rinse BID  . Chlorhexidine Gluconate Cloth  6 each Topical Q0600  . famotidine (PEPCID) IV  20 mg Intravenous Q12H  . feeding supplement (VITAL HIGH PROTEIN)  1,000 mL Per Tube Q24H  . furosemide  40 mg Intravenous BID  . gabapentin  300 mg Per Tube Q8H  . heparin  5,000 Units Subcutaneous Q8H  . imipenem-cilastatin  500 mg Intravenous Q8H  . insulin aspart  0-20 Units Subcutaneous Q4H  . ipratropium-albuterol  3 mL Nebulization Q6H  . multivitamin  5 mL Per Tube Daily  . mupirocin ointment  1 application Nasal BID  . pantoprazole (PROTONIX) IV  40 mg Intravenous Daily  . potassium chloride  10 mEq Intravenous Q1 Hr x 6  . potassium chloride  40 mEq Oral BID  . QUEtiapine  100 mg Per Tube QHS  . sodium phosphate  Dextrose 5% IVPB  30 mmol Intravenous Once  . traZODone  50 mg Per Tube QHS  . venlafaxine  75 mg Per Tube BID    Continuous Infusions: . dexmedetomidine 2.2 mcg/kg/hr (01/25/14 0800)  . norepinephrine (LEVOPHED) Adult infusion      Mikey College MS, RD, LDN [phone] Pager [phone] After Hours Pager

## 2014-01-25 NOTE — Progress Notes (Signed)
[facility] Progress Note Patient Name: [name] DOB: [birth year] MRN: [id number]  Date of Service  01/25/2014   HPI/Events of Note  Low K  eICU Interventions  Replaced      YACOUB,WESAM 01/25/2014, 4:46 AM

## 2014-01-25 NOTE — Progress Notes (Signed)
Name: [name] MRN: [id number] DOB: 1941/08/29    ADMISSION DATE:  01/21/2014 CONSULTATION DATE:  01/21/14  REFERRING MD :  Gabriel Cirri PRIMARY SERVICE: PCCM  CHIEF COMPLAINT:  Fever, hypoxia  BRIEF PATIENT DESCRIPTION: 73 yo resident of [facility] found by staff with fever and hypoxia. Taken to ED on NRB 100% with improvement. PCCM asked to see patient for respiratory needs and hypotension refractory to IVF. Recent admission to hospital 12/25/13 for ESBL E. Coli>>Urosepsis.  SIGNIFICANT EVENTS / STUDIES:  12/31/13 - CT angio chest  - Emphysema. Right consolidation. 2 nodules in right apex (f/u CT 4-6 weeks recommended) 2/2 >> Admitted to PCCM, LIJ placed, shock 2/3 intubated for resp distress  LINES / TUBES: PIV 2/2>>> LIJ 2/2>>> Foley 2/2>>> 2/3 ETT>> 2/5 (self extub) 2/5 ETT >>   CULTURES: Blood 2/2>>> Urine 2/2>>> (recent ESBL E. Coli isolated 12/31/13)>>e coli>> ESBL positive, sens to zosyn and imipenem resp virus 2/2>>negative  ANTIBIOTICS: Vancomycin 2/2>>> 2/5 Primaxin 2/2>>>  HISTORY OF PRESENT ILLNESS:  73 yo caucasian female resident of [facility] with PMH cervical cancer, DM, HTN, anxiety, COPD, bipolar and recent admission 12/25/13-01/01/14 to hospital with acute encephalopathy due to urosepsis due to E.Coli ESBL. Treated with cefepime narrowed to cipro for 10 days total (ESBL resistant to both). Records report that staff went to check on patient and she was found to be febrile with low O2 sats. Patient was put on non-rebreather @100 % with some anxiety but with some resolution after O2 sats improved. Brought to [facility] ED for workup of possible sepsis.  SUBJECTIVE:   Followed commands this am but did not tolerate PSV due to tachypnea  VITAL SIGNS: Temp:  [99.7 F (37.6 C)-100.9 F (38.3 C)] 100.9 F (38.3 C) (02/06 0800) Pulse Rate:  [81-121] 97 (02/06 0835) Resp:  [16-25] 24 (02/06 0835) BP: (144-192)/(43-142) 146/43 mmHg (02/06 0800) SpO2:  [93 %-100 %]  97 % (02/06 0835) FiO2 (%):  [40 %-100 %] 40 % (02/06 0835) Weight:  [89.8 kg (197 lb 15.6 oz)] 89.8 kg (197 lb 15.6 oz) (02/06 0400) HEMODYNAMICS:   VENTILATOR SETTINGS: Vent Mode:  [-] PRVC FiO2 (%):  [40 %-100 %] 40 % Set Rate:  [16 bmp] 16 bmp Vt Set:  [500 mL] 500 mL PEEP:  [5 cmH20] 5 cmH20 Plateau Pressure:  [19 cmH20-23 cmH20] 19 cmH20 INTAKE / OUTPUT: Intake/Output     02/05 0701 - 02/06 0700 02/06 0701 - 02/07 0700   I.V. (mL/kg) 1766.2 (19.7) 67.8 (0.8)   NG/GT 120    IV Piggyback 1350 50   Total Intake(mL/kg) 3236.2 (36) 117.8 (1.3)   Urine (mL/kg/hr) 8655 (4) 350 (1.4)   Total Output 8655 350   Net -5418.8 -232.2          PHYSICAL EXAMINATION: General appearance -sedated on vent Mental status - woke and moved purposefully on WUA, followed commands Eyes - PERRL Neck - supple, no significant adenopathy, LIJ in place Chest - distant breath sounds bilaterally Heart - normal rate, regular rhythm, normal S1, S2, no murmurs, rubs, clicks or gallops Abdomen - soft, nontender, nondistended, no masses or organomegaly Neurological -sedated Musculoskeletal - no joint tenderness, deformity or swelling Extremities - peripheral pulses normal, no pedal edema, no clubbing or cyanosis Skin - normal coloration and turgor, no rashes, no suspicious skin lesions noted   LABS:  CBC  Recent Labs Lab 01/23/14 0415 01/24/14 0450 01/25/14 0354  WBC 13.6* 10.2 6.7  HGB 9.6* 9.2* 9.4*  HCT 31.1* 30.4* 29.4*  PLT 122* 146* 182   Coag's  Recent Labs Lab 01/21/14 1139  INR 0.92   BMET  Recent Labs Lab 01/24/14 0450 01/24/14 1730 01/25/14 0354  NA 139 142 140  K 3.5* 2.6* 2.8*  CL 102 98 97  CO2 27 29 31   BUN 5* 6 6  CREATININE 0.42* 0.44* 0.42*  GLUCOSE 246* 269* 276*   Electrolytes  Recent Labs Lab 01/23/14 0415 01/24/14 0450 01/24/14 1730 01/25/14 0354  CALCIUM 7.6* 7.7* 7.6* 7.8*  MG 1.4* 1.6  --  1.8  PHOS 2.0* 1.9* 5.6* 1.9*   Sepsis  Markers  Recent Labs Lab 01/21/14 1640 01/21/14 1820 01/22/14 0420 01/22/14 1100 01/23/14 0415 01/23/14 0645  LATICACIDVEN 4.8*  --   --  1.0  --  0.9  PROCALCITON  --  2.20 1.71  --  0.68  --    ABG  Recent Labs Lab 01/22/14 1815 01/23/14 0352 01/25/14 0433  PHART 7.307* 7.361 7.470*  PCO2ART 54.8* 46.3* 42.6  PO2ART 293.0* 70.7* 67.5*   Liver Enzymes  Recent Labs Lab 01/21/14 1139  AST 30  ALT 14  ALKPHOS 57  BILITOT 0.2*  ALBUMIN 3.2*   Cardiac Enzymes No results found for this basename: TROPONINI, PROBNP,  in the last 168 hours Glucose  Recent Labs Lab 01/24/14 1138 01/24/14 1616 01/24/14 1938 01/24/14 2353 01/25/14 0336 01/25/14 0802  GLUCAP 203* 223* 217* 192* 240* 234*    Imaging Dg Chest Port 1 View  01/25/2014   CLINICAL DATA:  Endotracheal tube position.  EXAM: PORTABLE CHEST - 1 VIEW  COMPARISON:  Chest x-ray from yesterday  FINDINGS: Endotracheal tube ends in the mid thoracic trachea, improved from prior. Left IJ catheter, tip at the upper SVC. Enteric tube ends in the proximal stomach.  Given probable differences in positioning, unchanged appearance of bilateral pleural effusions which are small to moderate. There is pulmonary edema which also appear stable. Stable cardiomegaly. Mediastinal contours distorted by rightward rotation. No evidence of pneumothorax.  IMPRESSION: 1. Improved positioning of endotracheal tube. 2. No significant change in pulmonary edema and pleural effusions.   Electronically Signed   By: Jorje Guild M.D.   On: 01/25/2014 05:29   Dg Chest Port 1 View  01/24/2014   CLINICAL DATA:  Endotracheal tube positioned  EXAM: PORTABLE CHEST - 1 VIEW  COMPARISON:  January 23, 2014  FINDINGS: The heart size and mediastinal contours are stable. The heart size is enlarged. Endotracheal tube, left jugular central venous line, nasogastric tube are identified in good position without change. Diffuse patchy opacity are identified throughout  bilateral lungs minimally worse compared to prior exam. There are bilateral pleural effusions. Consolidation of bilateral lung bases are stable. The visualized skeletal structures are stable.  IMPRESSION: Congestive heart failure slightly worse compared to prior exam. Bilateral pleural effusions. Persistent consolidation of both lung bases.  Life supporting devices in good position without interval change.   Electronically Signed   By: Abelardo Diesel M.D.   On: 01/24/2014 07:17   Dg Chest Port 1v Same Day  01/24/2014   CLINICAL DATA:  Status post intubation  EXAM: PORTABLE CHEST - 1 VIEW SAME DAY  COMPARISON:  DG CHEST 1V PORT dated 01/24/2014  FINDINGS: The endotracheal tube tip lies approximately 1.1 cm above the crotch of the carina. The lungs are reasonably well inflated. The interstitial markings are increased diffusely but have improved somewhat since the earlier study. The cardiopericardial silhouette is mildly enlarged and  the left hemidiaphragm remains obscured. An esophagogastric tube tip projects below the inferior margin of the film. A left internal jugular venous catheter tip lies in the midportion of the SVC.  IMPRESSION: 1. The endotracheal tube tip lies approximately 1.1 cm above the crotch of the carina and withdrawal by 3-4 cm is recommended. 2. The esophagogastric tube and left internal jugular venous catheter appear to be in appropriate position. 3. There are persistently increased interstitial markings bilaterally which have improved somewhat since the previous study. The cardiopericardial silhouette remains mildly enlarged and the central pulmonary vascularity remains prominent though it has improved since yesterday's study. 4. These results were called by telephone at the time of interpretation on 01/24/2014 at 2:26 PM to [facility], who verbally acknowledged these results.   Electronically Signed   By: David  Martinique   On: 01/24/2014 14:28     ASSESSMENT /  PLAN:  PULMONARY A: Respiratory distress COPD R/o early HCAP vs ARDS vs cardiogenic edema; suspect ALI P:   Intubated 2/3 Goal SpO2 >92% BD's > duonebs + pulmicort bid Vent bundle Diuresis to goal CVP 5-7, repeat lasix 40mg  q12 on 2/6  CARDIOVASCULAR A: Septic shock Hypotension (hx HTN) Tachycardia P:  KVO IVF's, EGDT completed and pressors off Hold home anti-hypertensives (lopressor) Cortisol 16 but off pressor therefore no steroids, espec in bipolar individual  RENAL A:   E coli ESBL UTI AG metabolic acidosis 15 resolved Hx E. Coli urosepsis, ESBL - undertreated from last admission.  Hypophosphatemia P:   Monitor BMET Phos, Ca, K replaced Abx as below  GASTROINTESTINAL A:   Hx GERD SUP P:   Pepcid Npo  HEMATOLOGIC A:  Anemia, stable (asymptomatic), dvt prevention P:  Follow CBC Sub  q heparin  INFECTIOUS A:  Septic shock resolved, secondary to e coli UTI Leukocytosis, improving P:   Imipenem D/c'd vanco on 2/5 and following  ENDOCRINE A:   DMII (01/07/14 A1C = 8.1) Hyperglycemia P:   CBG checks 200+ SSI - change to resistant scale 2/6 Held metformin, will restart when taking PO's Lantus held, consider restart   NEUROLOGIC A:   Acute encephalopathy and agitation Anxiety Bipolar P:   -precedex + prn fentanyl and versed -restarted effexor, seroquel at 1/2 dose (100), trazadone at 1/2 dose (50) neurontin, to hopefully allow a smoother wake-up and facilitate extubation; possibly increase further on 2/7 if still requiring pushes of versed  TODAY'S SUMMARY: Septic shock due to ESBL + E coli.  VDRF w infiltrates. Now off pressors will need some diuresis and to be back on her psych meds before good candidate for extubation  40 minutes CC time.    Baltazar Apo, MD, PhD 01/25/2014, 9:50 AM [facility] [phone] or if no answer [phone]

## 2014-01-25 NOTE — Progress Notes (Signed)
Inpatient Diabetes Program Recommendations  AACE/ADA: New Consensus Statement on Inpatient Glycemic Control (2013)  Target Ranges:  Prepandial:   less than 140 mg/dL      Peak postprandial:   less than 180 mg/dL (1-2 hours)      Critically ill patients:  140 - 180 mg/dL   Reason for Visit: Hyperglycemia  Diabetes history: Type 2  Outpatient Diabetes medications: Metformin 1000mg  QD, Lantus 22 units QHS and Novolog s/s  Current orders for Inpatient glycemic control: Novolog sensitive Q4 hours  Results for [name], [name] (MRN [id number]) as of 01/25/2014 13:08  Ref. Range 01/24/2014 11:38 01/24/2014 16:16 01/24/2014 19:38 01/24/2014 23:53 01/25/2014 03:36 01/25/2014 08:02 01/25/2014 12:35  Glucose-Capillary Latest Range: 70-99 mg/dL 203 (H) 223 (H) 217 (H) 192 (H) 240 (H) 234 (H) 232 (H)   Uncontrolled blood sugars.  Recommendations  Please consider ICU Hyperglycemia Protocol.  If not, please add Lantus 10 units QHS and Novolog 4 units Q4 hours for TF coverage.  Will follow. Thank you. Lorenda Peck, RD, LDN, CDE Inpatient Diabetes Coordinator [phone]

## 2014-01-25 NOTE — Progress Notes (Signed)
PT Cancellation Note / Discharge  Patient Details Name: REGINAE WOLFREY MRN: [id number] DOB: 10-24-1941   Cancelled Treatment:    Reason Eval/Treat Not Completed: Patient not medically ready Pt remains on vent.  Please re-order when appropriate to mobilize.  PT to sign off at this time.   Silvina Hackleman,KATHrine E 01/25/2014, 12:05 PM Carmelia Bake, PT, DPT 01/25/2014 Pager: [phone]

## 2014-01-26 ENCOUNTER — Inpatient Hospital Stay (HOSPITAL_COMMUNITY): Payer: Medicare Other

## 2014-01-26 LAB — BASIC METABOLIC PANEL WITH GFR
BUN: 10 mg/dL (ref 6–23)
CO2: 30 meq/L (ref 19–32)
Calcium: 8.4 mg/dL (ref 8.4–10.5)
Chloride: 101 meq/L (ref 96–112)
Creatinine, Ser: 0.42 mg/dL — ABNORMAL LOW (ref 0.50–1.10)
GFR calc Af Amer: >90 mL/min
GFR calc non Af Amer: >90 mL/min
Glucose, Bld: 210 mg/dL — ABNORMAL HIGH (ref 70–99)
Potassium: 3.3 meq/L — ABNORMAL LOW (ref 3.7–5.3)
Sodium: 141 meq/L (ref 137–147)

## 2014-01-26 LAB — GLUCOSE, CAPILLARY
GLUCOSE-CAPILLARY: 211 mg/dL — AB (ref 70–99)
GLUCOSE-CAPILLARY: 150 mg/dL — AB (ref 70–99)
Glucose-Capillary: 189 mg/dL — ABNORMAL HIGH (ref 70–99)
Glucose-Capillary: 187 mg/dL — ABNORMAL HIGH (ref 70–99)
Glucose-Capillary: 148 mg/dL — ABNORMAL HIGH (ref 70–99)
Glucose-Capillary: 212 mg/dL — ABNORMAL HIGH (ref 70–99)

## 2014-01-26 LAB — CBC
HCT: 29.4 % — ABNORMAL LOW (ref 36.0–46.0)
Hemoglobin: 9.4 g/dL — ABNORMAL LOW (ref 12.0–15.0)
MCH: 28.6 pg (ref 26.0–34.0)
MCHC: 32 g/dL (ref 30.0–36.0)
MCV: 89.4 fL (ref 78.0–100.0)
Platelets: 209 K/uL (ref 150–400)
RBC: 3.29 MIL/uL — ABNORMAL LOW (ref 3.87–5.11)
RDW: 15.9 % — ABNORMAL HIGH (ref 11.5–15.5)
WBC: 7.3 K/uL (ref 4.0–10.5)

## 2014-01-26 LAB — CULTURE, RESPIRATORY W GRAM STAIN

## 2014-01-26 LAB — MAGNESIUM: Magnesium: 1.8 mg/dL (ref 1.5–2.5)

## 2014-01-26 LAB — CULTURE, RESPIRATORY

## 2014-01-26 LAB — PHOSPHORUS: Phosphorus: 2.9 mg/dL (ref 2.3–4.6)

## 2014-01-26 MED ORDER — METOPROLOL TARTRATE 25 MG PO TABS
25.0000 mg | ORAL_TABLET | Freq: Two times a day (BID) | ORAL | Status: DC
  Administered 2014-01-26 (×2): 25 mg via ORAL
  Filled 2014-01-26 (×4): qty 1

## 2014-01-26 MED ORDER — FUROSEMIDE 10 MG/ML IJ SOLN
40.0000 mg | Freq: Every day | INTRAMUSCULAR | Status: DC
  Administered 2014-01-26 – 2014-02-04 (×10): 40 mg via INTRAVENOUS
  Filled 2014-01-26 (×12): qty 4

## 2014-01-26 MED ORDER — DEXMEDETOMIDINE HCL IN NACL 400 MCG/100ML IV SOLN
0.4000 ug/kg/h | INTRAVENOUS | Status: DC
  Administered 2014-01-26 (×2): 2.5 ug/kg/h via INTRAVENOUS
  Administered 2014-01-26: 1.2 ug/kg/h via INTRAVENOUS
  Administered 2014-01-26: 2.5 ug/kg/h via INTRAVENOUS
  Filled 2014-01-26 (×3): qty 100

## 2014-01-26 MED ORDER — HALOPERIDOL LACTATE 5 MG/ML IJ SOLN
5.0000 mg | Freq: Once | INTRAMUSCULAR | Status: AC
  Administered 2014-01-26: 5 mg via INTRAVENOUS

## 2014-01-26 MED ORDER — OXYCODONE HCL 5 MG PO TABS
5.0000 mg | ORAL_TABLET | Freq: Two times a day (BID) | ORAL | Status: DC
  Administered 2014-01-26 – 2014-02-02 (×14): 5 mg via ORAL
  Filled 2014-01-26 (×14): qty 1

## 2014-01-26 MED ORDER — PROPOFOL 10 MG/ML IV EMUL
INTRAVENOUS | Status: AC
  Administered 2014-01-26: 1000 mg
  Filled 2014-01-26: qty 100

## 2014-01-26 MED ORDER — METOPROLOL TARTRATE 1 MG/ML IV SOLN
5.0000 mg | Freq: Once | INTRAVENOUS | Status: AC
  Administered 2014-01-26: 5 mg via INTRAVENOUS
  Filled 2014-01-26: qty 5

## 2014-01-26 MED ORDER — FAMOTIDINE 40 MG/5ML PO SUSR
20.0000 mg | Freq: Two times a day (BID) | ORAL | Status: DC
  Filled 2014-01-26 (×2): qty 2.5

## 2014-01-26 MED ORDER — HALOPERIDOL LACTATE 5 MG/ML IJ SOLN
5.0000 mg | Freq: Once | INTRAMUSCULAR | Status: AC
  Administered 2014-01-26: 5 mg via INTRAVENOUS
  Filled 2014-01-26: qty 1

## 2014-01-26 MED ORDER — TRAZODONE HCL 100 MG PO TABS
100.0000 mg | ORAL_TABLET | Freq: Every day | ORAL | Status: DC
  Administered 2014-01-26 – 2014-01-28 (×3): 100 mg
  Filled 2014-01-26 (×4): qty 1

## 2014-01-26 MED ORDER — POTASSIUM CHLORIDE 20 MEQ/15ML (10%) PO LIQD
20.0000 meq | ORAL | Status: AC
  Administered 2014-01-26 (×2): 20 meq
  Filled 2014-01-26 (×2): qty 15

## 2014-01-26 MED ORDER — PROPOFOL 10 MG/ML IV EMUL
5.0000 ug/kg/min | INTRAVENOUS | Status: DC
  Administered 2014-01-26: 15 ug/kg/min via INTRAVENOUS
  Administered 2014-01-26: 70 ug/kg/min via INTRAVENOUS
  Administered 2014-01-27: 60 ug/kg/min via INTRAVENOUS
  Administered 2014-01-27: 70 ug/kg/min via INTRAVENOUS
  Administered 2014-01-27: 60 ug/kg/min via INTRAVENOUS
  Administered 2014-01-27: 70 ug/kg/min via INTRAVENOUS
  Administered 2014-01-27: 60 ug/kg/min via INTRAVENOUS
  Administered 2014-01-27: 65 ug/kg/min via INTRAVENOUS
  Administered 2014-01-27: 60 ug/kg/min via INTRAVENOUS
  Administered 2014-01-28 (×4): 65 ug/kg/min via INTRAVENOUS
  Administered 2014-01-28: 30 ug/kg/min via INTRAVENOUS
  Administered 2014-01-28 – 2014-01-30 (×13): 65 ug/kg/min via INTRAVENOUS
  Administered 2014-01-30: 70 ug/kg/min via INTRAVENOUS
  Administered 2014-01-30: 65 ug/kg/min via INTRAVENOUS
  Administered 2014-01-30: 70 ug/kg/min via INTRAVENOUS
  Administered 2014-01-30 (×2): 65 ug/kg/min via INTRAVENOUS
  Administered 2014-01-31: 60 ug/kg/min via INTRAVENOUS
  Administered 2014-01-31: 45 ug/kg/min via INTRAVENOUS
  Administered 2014-01-31: 50 ug/kg/min via INTRAVENOUS
  Administered 2014-01-31: 30 ug/kg/min via INTRAVENOUS
  Administered 2014-01-31 (×3): 65 ug/kg/min via INTRAVENOUS
  Administered 2014-02-01: 30 ug/kg/min via INTRAVENOUS
  Administered 2014-02-01: 60 ug/kg/min via INTRAVENOUS
  Administered 2014-02-01: 59.947 ug/kg/min via INTRAVENOUS
  Filled 2014-01-26 (×47): qty 100

## 2014-01-26 MED ORDER — METOCLOPRAMIDE HCL 5 MG/ML IJ SOLN
10.0000 mg | Freq: Three times a day (TID) | INTRAMUSCULAR | Status: AC
  Administered 2014-01-26 – 2014-01-27 (×4): 10 mg via INTRAVENOUS
  Filled 2014-01-26 (×4): qty 2

## 2014-01-26 MED ORDER — DEXMEDETOMIDINE HCL IN NACL 400 MCG/100ML IV SOLN
INTRAVENOUS | Status: AC
  Filled 2014-01-26: qty 100

## 2014-01-26 MED ORDER — QUETIAPINE FUMARATE 100 MG PO TABS
100.0000 mg | ORAL_TABLET | Freq: Two times a day (BID) | ORAL | Status: DC
  Administered 2014-01-26 (×2): 100 mg
  Filled 2014-01-26 (×4): qty 1

## 2014-01-26 MED ORDER — RANITIDINE HCL 150 MG/10ML PO SYRP
150.0000 mg | ORAL_SOLUTION | Freq: Two times a day (BID) | ORAL | Status: DC
  Administered 2014-01-26 – 2014-02-02 (×13): 150 mg
  Filled 2014-01-26 (×15): qty 10

## 2014-01-26 MED ORDER — METOPROLOL TARTRATE 1 MG/ML IV SOLN
5.0000 mg | INTRAVENOUS | Status: DC | PRN
  Administered 2014-01-27 (×3): 5 mg via INTRAVENOUS
  Filled 2014-01-26 (×4): qty 5

## 2014-01-26 MED ORDER — GABAPENTIN 250 MG/5ML PO SOLN
600.0000 mg | Freq: Three times a day (TID) | ORAL | Status: DC
  Administered 2014-01-26 – 2014-02-03 (×23): 600 mg
  Filled 2014-01-26 (×32): qty 12

## 2014-01-26 MED ORDER — INSULIN GLARGINE 100 UNIT/ML ~~LOC~~ SOLN
10.0000 [IU] | Freq: Every day | SUBCUTANEOUS | Status: DC
  Administered 2014-01-26: 10 [IU] via SUBCUTANEOUS
  Filled 2014-01-26 (×2): qty 0.1

## 2014-01-26 MED ORDER — DEXMEDETOMIDINE HCL IN NACL 200 MCG/50ML IV SOLN: 0.4000 ug/kg/h | INTRAVENOUS | Status: DC

## 2014-01-26 MED ORDER — HALOPERIDOL LACTATE 5 MG/ML IJ SOLN
INTRAMUSCULAR | Status: AC
  Filled 2014-01-26: qty 1

## 2014-01-26 NOTE — Progress Notes (Signed)
[facility] Progress Note Patient Name: [name] DOB: 07/03/1941 MRN: [id number]  Date of Service  01/26/2014   HPI/Events of Note   Precedex expired.  eICU Interventions  Renewed.      YACOUB,WESAM 01/26/2014, 6:38 AM

## 2014-01-26 NOTE — Progress Notes (Signed)
[facility] Progress Note Patient Name: [name] DOB: 14-Apr-1941 MRN: [id number]  Date of Service  01/26/2014   HPI/Events of Note  RVR - EKG appears AF   eICU Interventions  Lopressor now & prn   Intervention Category Intermediate Interventions: Arrhythmia - evaluation and management  ALVA,RAKESH V. 01/26/2014, 10:41 PM

## 2014-01-26 NOTE — Progress Notes (Signed)
Patient very agitated during the day, requiring Versed 4 mg IV and Fentanyl 100 mcg IV for agitation prn.  Gave Versed 4 mg IV at 1440 and by 1515 patient was agitated, tonguing ETT and OG tube trying to displace the tubes.  Dr. Elsworth Soho notified and Halol 5 mg IV given without any change.  Diprivan drip started for agitation and drip up to 40 mcg/kg given, then decreased to 30 mcg/kg for sedation.  Continue to monitor patient for agitation and titrate Diprivan as needed.  Rhoderick Farrel Roselie Awkward RN

## 2014-01-26 NOTE — Progress Notes (Signed)
ANTIBIOTIC CONSULT NOTE - FOLLOW UP  Pharmacy Consult for Primaxin Indication: Sepsis, ESBL E.coli UTI  Allergies  Allergen Reactions  . Benzodiazepines Other (See Comments)    Mood dysregulation  (on MAR)  . Morphine And Related Other (See Comments)    Pain dysregulation Mood disruption  . Olanzapine     Hallucinations and disorientation  . Sulfamethoxazole-Trimethoprim Hives    Patient Measurements: Height: 5\' 2"  (157.5 cm) Weight: 192 lb 7.4 oz (87.3 kg) IBW/kg (Calculated) : 50.1  Vital Signs: Temp: 99.3 F (37.4 C) (02/07 1200) Temp src: Core (Comment) (02/07 0400) BP: 128/57 mmHg (02/07 1200) Pulse Rate: 86 (02/07 1200) Intake/Output from previous day: 02/06 0701 - 02/07 0700 In: 3932.6 [I.V.:2118.6; NG/GT:760; IV Piggyback:1054] Out: 6175 [Urine:6175]  Labs:  Recent Labs  01/24/14 0450  01/25/14 0354 01/25/14 2000 01/26/14 0450  WBC 10.2  --  6.7  --  7.3  HGB 9.2*  --  9.4*  --  9.4*  PLT 146*  --  182  --  209  CREATININE 0.42*  < > 0.42* 0.43* 0.42*  < > = values in this interval not displayed. Estimated Creatinine Clearance: 66.2 ml/min (by C-G formula based on Cr of 0.42).   Recent Labs  01/23/14 2330  VANCOTROUGH 7.6*     Microbiology: 1/6 urine >> pan-sensitive E. Coli 1/12 urine >> E. Coli +ESBL+ 2/2 urine >100k E.coli +ESBL+ 2/2 blood x 2 >> ngtd 2/2 MRSA PCR (+) 2/3 RSV panel: neg  Anti-infectives: 2/2 >> Cefepime x 1  2/2 >> Vanc >> 2/5 2/2 >> Primaxin >>   Assessment: 33 yoF hospitalized at [facility] 1/7-1/13/15 with E.coli urosepsis with septic shock. Per notes from SNF on 1/23, pt was found with rash under breast and abdominal folds treated with nystatin cream. Pt presented to ED 2/2 with reported fever and SOB at SNF. Pt found febrile, tachycardic, hypotensive. Sepsis protocol started and empiric abx with Vanc, Cefepime. Of note, recent E.coli from urine culture was an ESBL producer and patient was treated with Cefepime inpatient,  changed to Cipro at discharge (not susceptible).   Day #6 Primaxin 500mg  IV q8h (vancomycin d/c)  Tm 100.9  WBC improved to WNL.  SCr wnl and stable, normalized CrCl ~58 ml/min (rounding SCr up to 1.0)  E.coli +ESBL growing from urine  Goal of Therapy:  Primaxin dose per weight/renal function  Plan:   Continue Primaxin 500mg  IV q8h  Continue to follow up renal function and cultures as available.   Gretta Arab PharmD, BCPS Pager [phone] 01/26/2014 1:06 PM

## 2014-01-26 NOTE — Progress Notes (Signed)
[facility] ADULT ICU REPLACEMENT PROTOCOL FOR AM LAB REPLACEMENT ONLY  The patient does apply for the [facility] Adult ICU Electrolyte Replacment Protocol based on the criteria listed below:   1. Is GFR >/= 40 ml/min? yes  Patient's GFR today is >90 2. Is urine output >/= 0.5 ml/kg/hr for the last 6 hours? yes Patient's UOP is 2.7 ml/kg/hr 3. Is BUN < 60 mg/dL? yes  Patient's BUN today is 10 4. Abnormal electrolyte(s): K 3.3, Mag 1.8 5. Ordered repletion with: per protocol 6. If a panic level lab has been reported, has the CCM MD in charge been notified? no.   Physician:    Ronda Fairly A 01/26/2014 5:41 AM

## 2014-01-26 NOTE — Progress Notes (Signed)
Name: [name] MRN: [id number] DOB: Jun 18, 1941    ADMISSION DATE:  01/21/2014 CONSULTATION DATE:  01/21/14  REFERRING MD :  Cynda Acres PRIMARY SERVICE: PCCM  CHIEF COMPLAINT:  Fever, hypoxia  BRIEF PATIENT DESCRIPTION: 73 yo resident of [facility] found by staff with fever and hypoxia. Taken to ED on NRB 100% with improvement. PCCM asked to see patient for respiratory needs and hypotension refractory to IVF. Recent admission to hospital 12/25/13 for ESBL E. Coli>>Urosepsis.  SIGNIFICANT EVENTS / STUDIES:  12/31/13 - CT angio chest  - Emphysema. Right consolidation. 2 nodules in right apex (f/u CT 4-6 weeks recommended) 2/2 >> Admitted to PCCM, LIJ placed, shock 2/3 intubated for resp distress  LINES / TUBES: PIV 2/2>>> LIJ 2/2>>> Foley 2/2>>> 2/3 ETT>> 2/5 (self extub) 2/5 ETT >>   CULTURES: Blood 2/2>>> Urine 2/2>>> (recent ESBL E. Coli isolated 12/31/13)>>e coli>> ESBL positive, sens to zosyn and imipenem resp virus 2/2>>negative  ANTIBIOTICS: Vancomycin 2/2>>> 2/5 Primaxin 2/2>>>  HISTORY OF PRESENT ILLNESS:  73 yo caucasian female resident of [facility] with PMH cervical cancer, DM, HTN, anxiety, COPD, bipolar and recent admission 12/25/13-01/01/14 to hospital with acute encephalopathy due to urosepsis due to E.Coli ESBL. Treated with cefepime narrowed to cipro for 10 days total (ESBL resistant to both). Records report that staff went to check on patient and she was found to be febrile with low O2 sats. Patient was put on non-rebreather @100 % with some anxiety but with some resolution after O2 sats improved. Brought to [facility] ED for workup of possible sepsis.  SUBJECTIVE:   Int agitation Afebrile copiuos secretions Good UO  VITAL SIGNS: Temp:  [98.8 F (37.1 C)-100.4 F (38 C)] 99.3 F (37.4 C) (02/07 0800) Pulse Rate:  [68-97] 90 (02/07 0800) Resp:  [16-22] 22 (02/07 0800) BP: (134-175)/(50-80) 156/72 mmHg (02/07 0800) SpO2:  [94 %-100 %] 98 % (02/07  0831) FiO2 (%):  [40 %] 40 % (02/07 0846) Weight:  [87.3 kg (192 lb 7.4 oz)] 87.3 kg (192 lb 7.4 oz) (02/07 0200) HEMODYNAMICS:   VENTILATOR SETTINGS: Vent Mode:  [-] CPAP FiO2 (%):  [40 %] 40 % Set Rate:  [16 bmp] 16 bmp Vt Set:  [500 mL] 500 mL PEEP:  [5 cmH20] 5 cmH20 Pressure Support:  [5 cmH20] 5 cmH20 Plateau Pressure:  [16 cmH20-20 cmH20] 19 cmH20 INTAKE / OUTPUT: Intake/Output     02/06 0701 - 02/07 0700 02/07 0701 - 02/08 0700   I.V. (mL/kg) 2118.6 (24.3) 94.6 (1.1)   NG/GT 730 60   IV Piggyback 1054    Total Intake(mL/kg) 3902.6 (44.7) 154.6 (1.8)   Urine (mL/kg/hr) 6175 (2.9) 150 (0.6)   Total Output 6175 150   Net -2272.4 +4.6          PHYSICAL EXAMINATION: General appearance -sedated on vent Mental status - woke and moved purposefully on WUA, followed commands Eyes - PERRL Neck - supple, no significant adenopathy, LIJ in place Chest - distant breath sounds bilaterally Heart - normal rate, regular rhythm, normal S1, S2, no murmurs, rubs, clicks or gallops Abdomen - soft, nontender, nondistended, no masses or organomegaly Neurological -sedated Musculoskeletal - no joint tenderness, deformity or swelling Extremities - peripheral pulses normal, no pedal edema, no clubbing or cyanosis Skin - normal coloration and turgor, no rashes, no suspicious skin lesions noted   LABS:  CBC  Recent Labs Lab 01/24/14 0450 01/25/14 0354 01/26/14 0450  WBC 10.2 6.7 7.3  HGB 9.2* 9.4* 9.4*  HCT 30.4* 29.4* 29.4*  PLT 146* 182 209   Coag's  Recent Labs Lab 01/21/14 1139  INR 0.92   BMET  Recent Labs Lab 01/25/14 0354 01/25/14 2000 01/26/14 0450  NA 140 141 141  K 2.8* 3.1* 3.3*  CL 97 100 101  CO2 31 31 30   BUN 6 10 10   CREATININE 0.42* 0.43* 0.42*  GLUCOSE 276* 169* 210*   Electrolytes  Recent Labs Lab 01/24/14 0450  01/25/14 0354 01/25/14 2000 01/26/14 0450  CALCIUM 7.7*  < > 7.8* 8.1* 8.4  MG 1.6  --  1.8  --  1.8  PHOS 1.9*  < > 1.9* 3.1  2.9  < > = values in this interval not displayed. Sepsis Markers  Recent Labs Lab 01/21/14 1640 01/21/14 1820 01/22/14 0420 01/22/14 1100 01/23/14 0415 01/23/14 0645  LATICACIDVEN 4.8*  --   --  1.0  --  0.9  PROCALCITON  --  2.20 1.71  --  0.68  --    ABG  Recent Labs Lab 01/22/14 1815 01/23/14 0352 01/25/14 0433  PHART 7.307* 7.361 7.470*  PCO2ART 54.8* 46.3* 42.6  PO2ART 293.0* 70.7* 67.5*   Liver Enzymes  Recent Labs Lab 01/21/14 1139  AST 30  ALT 14  ALKPHOS 57  BILITOT 0.2*  ALBUMIN 3.2*   Cardiac Enzymes No results found for this basename: TROPONINI, PROBNP,  in the last 168 hours Glucose  Recent Labs Lab 01/25/14 0802 01/25/14 1235 01/25/14 1701 01/25/14 2051 01/26/14 0355 01/26/14 0806  GLUCAP 234* 232* 259* 127* 189* 187*    Imaging Dg Chest Port 1 View  01/26/2014   CLINICAL DATA:  73 year old female intubated. Sepsis. Initial encounter.  EXAM: PORTABLE CHEST - 1 VIEW  COMPARISON:  01/25/2014 and earlier.  FINDINGS: Portable AP semi upright view at 0444 hrs. Stable endotracheal tube, tip 19 mm above the carina. Stable left IJ central line. Stable enteric tube, side hole projects over the gastric air bubble.  Bilateral veiling basilar pulmonary opacity not significantly changed compatible with pleural effusions. Stable cardiac size and mediastinal contours. Superimposed streaky and interstitial pulmonary opacity not significantly changed. No pneumothorax.  IMPRESSION: 1.  Stable lines and tubes. 2. Stable with bilateral pleural effusions and streaky perihilar opacity.   Electronically Signed   By: Augusto Gamble M.D.   On: 01/26/2014 06:59   Dg Chest Port 1 View  01/25/2014   CLINICAL DATA:  Endotracheal tube position.  EXAM: PORTABLE CHEST - 1 VIEW  COMPARISON:  Chest x-ray from yesterday  FINDINGS: Endotracheal tube ends in the mid thoracic trachea, improved from prior. Left IJ catheter, tip at the upper SVC. Enteric tube ends in the proximal stomach.   Given probable differences in positioning, unchanged appearance of bilateral pleural effusions which are small to moderate. There is pulmonary edema which also appear stable. Stable cardiomegaly. Mediastinal contours distorted by rightward rotation. No evidence of pneumothorax.  IMPRESSION: 1. Improved positioning of endotracheal tube. 2. No significant change in pulmonary edema and pleural effusions.   Electronically Signed   By: Tiburcio Pea M.D.   On: 01/25/2014 05:29   Dg Chest Port 1v Same Day  01/24/2014   CLINICAL DATA:  Status post intubation  EXAM: PORTABLE CHEST - 1 VIEW SAME DAY  COMPARISON:  DG CHEST 1V PORT dated 01/24/2014  FINDINGS: The endotracheal tube tip lies approximately 1.1 cm above the crotch of the carina. The lungs are reasonably well inflated. The interstitial markings are increased diffusely but have improved somewhat  since the earlier study. The cardiopericardial silhouette is mildly enlarged and the left hemidiaphragm remains obscured. An esophagogastric tube tip projects below the inferior margin of the film. A left internal jugular venous catheter tip lies in the midportion of the SVC.  IMPRESSION: 1. The endotracheal tube tip lies approximately 1.1 cm above the crotch of the carina and withdrawal by 3-4 cm is recommended. 2. The esophagogastric tube and left internal jugular venous catheter appear to be in appropriate position. 3. There are persistently increased interstitial markings bilaterally which have improved somewhat since the previous study. The cardiopericardial silhouette remains mildly enlarged and the central pulmonary vascularity remains prominent though it has improved since yesterday's study. 4. These results were called by telephone at the time of interpretation on 01/24/2014 at 2:26 PM to [facility], who verbally acknowledged these results.   Electronically Signed   By: David  [place]   On: 01/24/2014 14:28     ASSESSMENT / PLAN:  PULMONARY A: Acute  Respiratory Failure COPD R/o early HCAP vs ARDS vs cardiogenic edema; suspect ALI P:   Intubated 2/3 Goal SpO2 >92% BD's > duonebs + pulmicort bid Vent bundle Diuresis to goal CVP 5-7, repeat lasix 40mg  q12 on 2/6  CARDIOVASCULAR A: Septic shock (hx HTN)  P:  pressors off resume home anti-hypertensives (lopressor) Cortisol 16 but off pressor therefore no steroids, espec in bipolar individual  RENAL A:   E coli ESBL UTI AG metabolic acidosis 15 resolved Hx E. Coli urosepsis, ESBL - undertreated from last admission.  Hypophosphatemia/ hypokalemia P:   Monitor BMET Phos, Ca, K replaced Abx as below  GASTROINTESTINAL A:   Hx GERD SUP P:   Pepcid TFs  HEMATOLOGIC A:  Anemia, stable (asymptomatic), dvt prevention P:  Follow CBC Sub  q heparin  INFECTIOUS A:  Septic shock resolved, secondary to e coli UTI Leukocytosis, improving P:   Imipenem D/c'd vanco on 2/5 and following  ENDOCRINE A:   DMII (01/07/14 A1C = 8.1) Hyperglycemia P:   CBG checks 200+ SSI - change to resistant scale 2/6 Held metformin, will restart when taking PO's Lantus restart   NEUROLOGIC A:   Acute encephalopathy and agitation Anxiety Bipolar P:   -precedex + prn fentanyl and versed -restarted effexor, increased seroquel 200, trazadone 100,neurontin 600 q 8h, to hopefully allow a smoother wake-up and facilitate extubation  TODAY'S SUMMARY: Septic shock due to ESBL + E coli.  VDRF w infiltrates. Now off pressors will need some diuresis and to be back on her psych meds before  extubation  Care during the described time interval was provided by me and/or other providers on the critical care team.  I have reviewed this patient's available data, including medical history, events of note, physical examination and test results as part of my evaluation  CC time x 3m  Riggin Cuttino V. [phone] 01/26/2014, 9:44 AM

## 2014-01-27 ENCOUNTER — Inpatient Hospital Stay (HOSPITAL_COMMUNITY): Payer: Medicare Other

## 2014-01-27 LAB — CULTURE, BLOOD (ROUTINE X 2)
CULTURE: NO GROWTH
Culture: NO GROWTH

## 2014-01-27 LAB — BASIC METABOLIC PANEL
BUN: 18 mg/dL (ref 6–23)
CO2: 27 mEq/L (ref 19–32)
CREATININE: 0.53 mg/dL (ref 0.50–1.10)
Calcium: 8.5 mg/dL (ref 8.4–10.5)
Chloride: 103 mEq/L (ref 96–112)
Glucose, Bld: 293 mg/dL — ABNORMAL HIGH (ref 70–99)
Potassium: 3.2 mEq/L — ABNORMAL LOW (ref 3.7–5.3)
Sodium: 143 mEq/L (ref 137–147)

## 2014-01-27 LAB — GLUCOSE, CAPILLARY
GLUCOSE-CAPILLARY: 203 mg/dL — AB (ref 70–99)
Glucose-Capillary: 225 mg/dL — ABNORMAL HIGH (ref 70–99)
Glucose-Capillary: 234 mg/dL — ABNORMAL HIGH (ref 70–99)
Glucose-Capillary: 219 mg/dL — ABNORMAL HIGH (ref 70–99)
Glucose-Capillary: 176 mg/dL — ABNORMAL HIGH (ref 70–99)
Glucose-Capillary: 146 mg/dL — ABNORMAL HIGH (ref 70–99)

## 2014-01-27 LAB — CBC
HCT: 29 % — ABNORMAL LOW (ref 36.0–46.0)
Hemoglobin: 8.9 g/dL — ABNORMAL LOW (ref 12.0–15.0)
MCH: 27.7 pg (ref 26.0–34.0)
MCHC: 30.7 g/dL (ref 30.0–36.0)
MCV: 90.3 fL (ref 78.0–100.0)
PLATELETS: 258 10*3/uL (ref 150–400)
RBC: 3.21 MIL/uL — ABNORMAL LOW (ref 3.87–5.11)
RDW: 16.6 % — AB (ref 11.5–15.5)
WBC: 7.4 10*3/uL (ref 4.0–10.5)

## 2014-01-27 LAB — MAGNESIUM: Magnesium: 1.9 mg/dL (ref 1.5–2.5)

## 2014-01-27 IMAGING — CR DG CHEST 1V PORT
1 series · 1 of 1 positions shown · non-contrast
Comparison: PA and lateral chest 08/07/2012.

CLINICAL DATA: Fever and weakness.

EXAM:
PORTABLE CHEST - 1 VIEW

[AP]
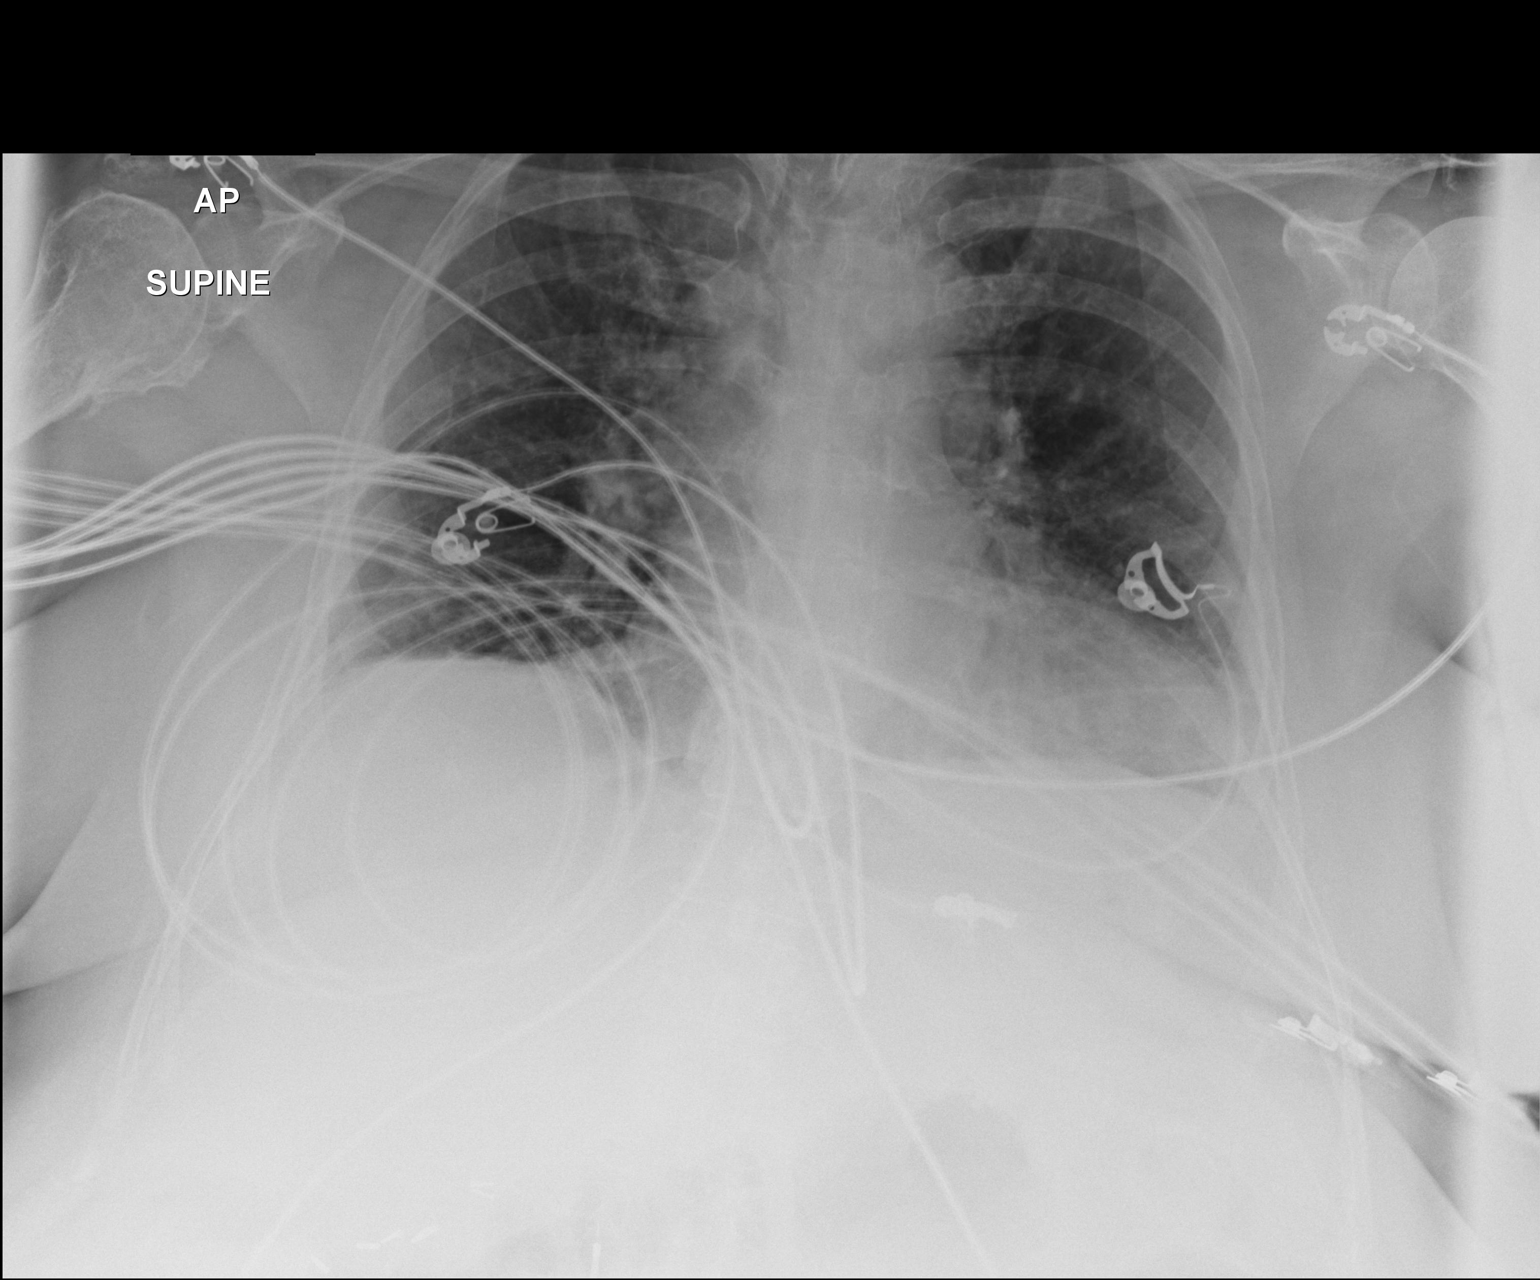

[1 of 1 positions shown; findings below may reference images not displayed]

FINDINGS: Lung volumes are low with mild atelectasis in the bases. There is
cardiomegaly without edema. No pneumothorax or pleural effusion.
IMPRESSION: No acute finding in a low volume chest.

## 2014-01-27 IMAGING — CR DG CHEST 1V PORT
1 series · 1 of 1 positions shown · non-contrast
Comparison: Portable chest x-ray December 25, 2013 at 2262 hr.

CLINICAL DATA: Code sepsis.

EXAM:
PORTABLE CHEST - 1 VIEW

[AP]
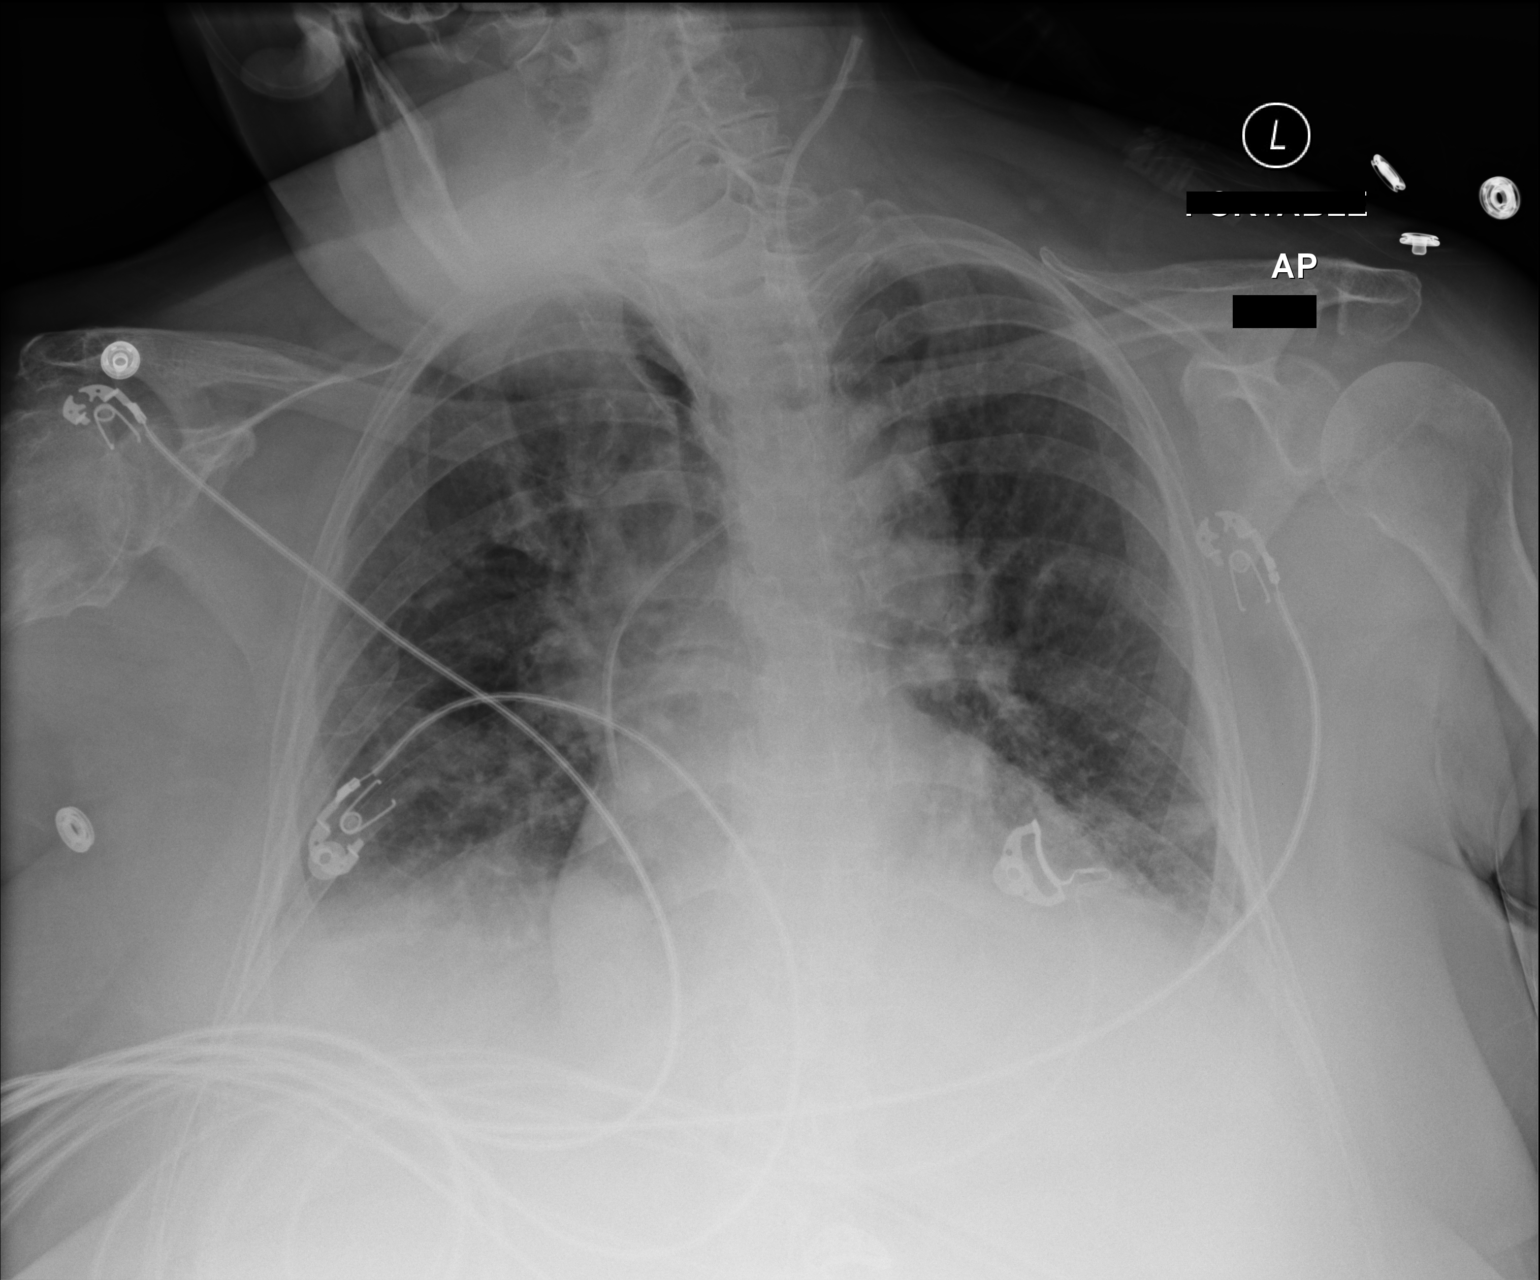

[1 of 1 positions shown; findings below may reference images not displayed]

FINDINGS: The lungs remain mildly hypoinflated. The interstitial markings
remain mildly increased. The hemidiaphragms are less well
demonstrated currently. The cardiac silhouette remains enlarged. The
pulmonary vascularity is somewhat indistinct though stable. No
alveolar infiltrate is demonstrated. No large pleural effusion is
demonstrated. There is a left internal jugular venous catheter in
place whose tip lies in the region of the mid to distal SVC.
IMPRESSION: There has not been significant interval change in the appearance of
the chest since the earlier study. Bilateral pulmonary hypo
inflation accentuates the interstitial markings. No discrete
alveolar pneumonia is demonstrated.

## 2014-01-27 MED ORDER — POTASSIUM CHLORIDE 20 MEQ/15ML (10%) PO LIQD
30.0000 meq | ORAL | Status: AC
  Administered 2014-01-27 (×2): 30 meq
  Filled 2014-01-27 (×2): qty 22.5

## 2014-01-27 MED ORDER — QUETIAPINE FUMARATE 200 MG PO TABS
200.0000 mg | ORAL_TABLET | Freq: Two times a day (BID) | ORAL | Status: DC
  Administered 2014-01-27 – 2014-01-30 (×7): 200 mg
  Filled 2014-01-27 (×8): qty 1

## 2014-01-27 MED ORDER — INSULIN GLARGINE 100 UNIT/ML ~~LOC~~ SOLN
20.0000 [IU] | Freq: Every day | SUBCUTANEOUS | Status: DC
  Administered 2014-01-27 – 2014-02-05 (×10): 20 [IU] via SUBCUTANEOUS
  Filled 2014-01-27 (×11): qty 0.2

## 2014-01-27 MED ORDER — POTASSIUM CHLORIDE 20 MEQ/15ML (10%) PO LIQD
ORAL | Status: AC
  Administered 2014-01-27: 20 meq
  Filled 2014-01-27: qty 30

## 2014-01-27 MED ORDER — METOPROLOL TARTRATE 50 MG PO TABS
50.0000 mg | ORAL_TABLET | Freq: Two times a day (BID) | ORAL | Status: DC
  Administered 2014-01-27 – 2014-02-05 (×19): 50 mg via ORAL
  Filled 2014-01-27 (×20): qty 1

## 2014-01-27 MED ORDER — ACETAMINOPHEN 160 MG/5ML PO SOLN
650.0000 mg | Freq: Four times a day (QID) | ORAL | Status: DC | PRN
  Administered 2014-01-27: 650 mg
  Filled 2014-01-27: qty 20.3

## 2014-01-27 NOTE — Progress Notes (Signed)
Name: [name] MRN: [id number] DOB: [birth year]    ADMISSION DATE:  01/21/2014 CONSULTATION DATE:  01/21/14  REFERRING MD :  Cynda Acres PRIMARY SERVICE: PCCM  CHIEF COMPLAINT:  Fever, hypoxia  BRIEF PATIENT DESCRIPTION: 73 yo resident of [facility] found by staff with fever and hypoxia. Taken to ED on NRB 100% with improvement. PCCM asked to see patient for respiratory needs and hypotension refractory to IVF. Recent admission to hospital 12/25/13 for ESBL E. Coli>>Urosepsis.  SIGNIFICANT EVENTS / STUDIES:  12/31/13 - CT angio chest  - Emphysema. Right consolidation. 2 nodules in right apex (f/u CT 4-6 weeks recommended) 2/2 >> Admitted to PCCM, LIJ placed, shock 2/3 intubated for resp distress 2/7 AF-RVR  LINES / TUBES: PIV 2/2>>> LIJ 2/2>>> Foley 2/2>>> 2/3 ETT>> 2/5 (self extub) 2/5 ETT >>   CULTURES: Blood 2/2>>>ng Urine 2/2>>> (recent ESBL E. Coli isolated 12/31/13)>>e coli>> ESBL positive, sens to zosyn and imipenem resp virus 2/2>>negative  ANTIBIOTICS: Vancomycin 2/2>>> 2/5 Primaxin 2/2>>>  HISTORY OF PRESENT ILLNESS:  73 yo caucasian female resident of [facility] with PMH cervical cancer, DM, HTN, anxiety, COPD, bipolar and recent admission 12/25/13-01/01/14 to hospital with acute encephalopathy due to urosepsis due to E.Coli ESBL. Treated with cefepime narrowed to cipro for 10 days total (ESBL resistant to both). Records report that staff went to check on patient and she was found to be febrile with low O2 sats. Patient was put on non-rebreather @100 % with some anxiety but with some resolution after O2 sats improved. Brought to [facility] ED for workup of possible sepsis.  SUBJECTIVE:   Int agitation continues Afebrile copiuos frothy secretions Good UO No resiudlas  VITAL SIGNS: Temp:  [98.8 F (37.1 C)-101.1 F (38.4 C)] 99.7 F (37.6 C) (02/08 0700) Pulse Rate:  [84-167] 122 (02/08 0700) Resp:  [16-29] 21 (02/08 0700) BP: (93-157)/(44-81) 111/48 mmHg (02/08  0700) SpO2:  [94 %-98 %] 97 % (02/08 0700) FiO2 (%):  [40 %] 40 % (02/08 0900) Weight:  [87.7 kg (193 lb 5.5 oz)] 87.7 kg (193 lb 5.5 oz) (02/08 0500) HEMODYNAMICS:   VENTILATOR SETTINGS: Vent Mode:  [-] CPAP FiO2 (%):  [40 %] 40 % Set Rate:  [16 bmp] 16 bmp Vt Set:  [500 mL] 500 mL PEEP:  [5 cmH20] 5 cmH20 Pressure Support:  [5 cmH20] 5 cmH20 Plateau Pressure:  [15 cmH20-20 cmH20] 17 cmH20 INTAKE / OUTPUT: Intake/Output     02/07 0701 - 02/08 0700 02/08 0701 - 02/09 0700   I.V. (mL/kg) 1008.1 (11.5) 51.4 (0.6)   NG/GT 850 30   IV Piggyback 450    Total Intake(mL/kg) 2308.1 (26.3) 81.4 (0.9)   Urine (mL/kg/hr) 3450 (1.6)    Total Output 3450     Net -1141.9 +81.4          PHYSICAL EXAMINATION: General appearance -anxious aoppearing, wide awake on propofol Mental status - woke and moved purposefully on WUA, followed commands Eyes - PERRL Neck - supple, no significant adenopathy, LIJ in place Chest - distant breath sounds bilaterally Heart - normal rate, regular rhythm, normal S1, S2, no murmurs, rubs, clicks or gallops Abdomen - soft, nontender, nondistended, no masses or organomegaly Neurological -anxious, non focal, tremor + Musculoskeletal - no joint tenderness, deformity or swelling Extremities - peripheral pulses normal, no pedal edema, no clubbing or cyanosis Skin - normal coloration and turgor, no rashes, no suspicious skin lesions noted   LABS:  CBC  Recent Labs Lab 01/25/14 0354 01/26/14 0450  01/27/14 0530  WBC 6.7 7.3 7.4  HGB 9.4* 9.4* 8.9*  HCT 29.4* 29.4* 29.0*  PLT 182 209 258   Coag's  Recent Labs Lab 01/21/14 1139  INR 0.92   BMET  Recent Labs Lab 01/25/14 2000 01/26/14 0450 01/27/14 0530  NA 141 141 143  K 3.1* 3.3* 3.2*  CL 100 101 103  CO2 31 30 27   BUN 10 10 18   CREATININE 0.43* 0.42* 0.53  GLUCOSE 169* 210* 293*   Electrolytes  Recent Labs Lab 01/25/14 0354 01/25/14 2000 01/26/14 0450 01/27/14 0530  CALCIUM 7.8*  8.1* 8.4 8.5  MG 1.8  --  1.8 1.9  PHOS 1.9* 3.1 2.9  --    Sepsis Markers  Recent Labs Lab 01/21/14 1640 01/21/14 1820 01/22/14 0420 01/22/14 1100 01/23/14 0415 01/23/14 0645  LATICACIDVEN 4.8*  --   --  1.0  --  0.9  PROCALCITON  --  2.20 1.71  --  0.68  --    ABG  Recent Labs Lab 01/22/14 1815 01/23/14 0352 01/25/14 0433  PHART 7.307* 7.361 7.470*  PCO2ART 54.8* 46.3* 42.6  PO2ART 293.0* 70.7* 67.5*   Liver Enzymes  Recent Labs Lab 01/21/14 1139  AST 30  ALT 14  ALKPHOS 57  BILITOT 0.2*  ALBUMIN 3.2*   Cardiac Enzymes No results found for this basename: TROPONINI, PROBNP,  in the last 168 hours Glucose  Recent Labs Lab 01/26/14 0806 01/26/14 1203 01/26/14 1617 01/26/14 2038 01/27/14 0014 01/27/14 0357  GLUCAP 187* 211* 148* 150* 225* 234*    Imaging Dg Chest Port 1 View  01/27/2014   CLINICAL DATA:  Pneumonia  EXAM: PORTABLE CHEST - 1 VIEW  COMPARISON:  01/26/2014  FINDINGS: An endotracheal tube is noted approximately 1 cm above the carina. This could be withdrawn 2-3 cm. A nasogastric catheter is noted within the stomach and stable. A left central venous line is again seen and stable. The cardiac shadow is at the upper limits of normal. Patchy infiltrates are identified bilaterally but worse in the right mid and lower lung. No to focal abnormality is same.  IMPRESSION: Endotracheal tube just above the carina. This could be withdrawn 2-3 cm.  Patchy infiltrates bilaterally.  No new focal abnormality is noted.   Electronically Signed   By: Alcide Clever M.D.   On: 01/27/2014 07:19   Dg Chest Port 1 View  01/26/2014   CLINICAL DATA:  73 year old female intubated. Sepsis. Initial encounter.  EXAM: PORTABLE CHEST - 1 VIEW  COMPARISON:  01/25/2014 and earlier.  FINDINGS: Portable AP semi upright view at 0444 hrs. Stable endotracheal tube, tip 19 mm above the carina. Stable left IJ central line. Stable enteric tube, side hole projects over the gastric air bubble.   Bilateral veiling basilar pulmonary opacity not significantly changed compatible with pleural effusions. Stable cardiac size and mediastinal contours. Superimposed streaky and interstitial pulmonary opacity not significantly changed. No pneumothorax.  IMPRESSION: 1.  Stable lines and tubes. 2. Stable with bilateral pleural effusions and streaky perihilar opacity.   Electronically Signed   By: Augusto Gamble M.D.   On: 01/26/2014 06:59     ASSESSMENT / PLAN:  PULMONARY A: Acute Respiratory Failure COPD R/o early HCAP vs ARDS vs cardiogenic edema; suspect ALI P:   Intubated 2/3 Goal SpO2 >92% BD's > duonebs + pulmicort bid Vent bundle Diuresis to goal CVP 5-7, repeat lasix 40mg  q12 on 2/6  CARDIOVASCULAR A: Septic shock (hx HTN)  P:  pressors off Increase lopressor  50 bid & use 5 q6h prn Cortisol 16 but off pressor therefore no steroids, espec in bipolar individual  RENAL A:   E coli ESBL UTI AG metabolic acidosis 15 resolved Hypophosphatemia/ hypokalemia P:   Monitor BMET Phos, Ca, K replaced   GASTROINTESTINAL A:   Hx GERD SUP P:   Pepcid TFs reglan x 4 doses -helped  HEMATOLOGIC A:  Anemia, stable (asymptomatic), dvt prevention P:  Follow CBC Sub  q heparin  INFECTIOUS A:  Septic shock resolved, secondary to e coli UTI Leukocytosis, improving Hx E. Coli urosepsis, ESBL - undertreated from last admission.  P:   Imipenem D/c'd vanco on 2/5   ENDOCRINE A:   DMII (01/07/14 A1C = 8.1) Hyperglycemia P:   CBG checks 200+ SSI - change to resistant scale 2/6 Held metformin, will restart when taking PO's Lantus restarted   NEUROLOGIC A:   Acute encephalopathy and agitation Anxiety Bipolar P:   -propofol + prn fentanyl and versed -restarted effexor, increased seroquel 200 bid, trazadone 100,neurontin 600 q 8h, to hopefully allow a smoother wake-up and facilitate extubation  TODAY'S SUMMARY: Septic shock due to ESBL + E coli.  VDRF w infiltrates. Now  off pressors will need better HR control,  more diuresis and to be back on her psych meds before  Extubation, hope in 24h   Care during the described time interval was provided by me and/or other providers on the critical care team.  I have reviewed this patient's available data, including medical history, events of note, physical examination and test results as part of my evaluation  CC time x 71m  Ivis Nicolson V. [phone] 01/27/2014, 9:27 AM

## 2014-01-27 NOTE — Progress Notes (Signed)
[facility] ADULT ICU REPLACEMENT PROTOCOL FOR AM LAB REPLACEMENT ONLY  The patient does apply for the [facility] Adult ICU Electrolyte Replacment Protocol based on the criteria listed below:   1. Is GFR >/= 40 ml/min? yes  Patient's GFR today is >90 2. Is urine output >/= 0.5 ml/kg/hr for the last 6 hours? yes Patient's UOP is 0.76 ml/kg/hr 3. Is BUN < 60 mg/dL? yes  Patient's BUN today is 18 4. Abnormal electrolyte(s): Potassium 5. Ordered repletion with: Potassium per Protocol  Charlye Spare P 01/27/2014 6:17 AM

## 2014-01-28 ENCOUNTER — Inpatient Hospital Stay (HOSPITAL_COMMUNITY): Payer: Medicare Other

## 2014-01-28 DIAGNOSIS — Z515 Encounter for palliative care: Secondary | ICD-10-CM

## 2014-01-28 DIAGNOSIS — F411 Generalized anxiety disorder: Secondary | ICD-10-CM

## 2014-01-28 DIAGNOSIS — F319 Bipolar disorder, unspecified: Secondary | ICD-10-CM

## 2014-01-28 LAB — GLUCOSE, CAPILLARY
GLUCOSE-CAPILLARY: 264 mg/dL — AB (ref 70–99)
GLUCOSE-CAPILLARY: 238 mg/dL — AB (ref 70–99)
Glucose-Capillary: 183 mg/dL — ABNORMAL HIGH (ref 70–99)
Glucose-Capillary: 191 mg/dL — ABNORMAL HIGH (ref 70–99)
Glucose-Capillary: 197 mg/dL — ABNORMAL HIGH (ref 70–99)
Glucose-Capillary: 217 mg/dL — ABNORMAL HIGH (ref 70–99)

## 2014-01-28 LAB — CBC
HCT: 29.2 % — ABNORMAL LOW (ref 36.0–46.0)
Hemoglobin: 8.9 g/dL — ABNORMAL LOW (ref 12.0–15.0)
MCH: 28.1 pg (ref 26.0–34.0)
MCHC: 30.5 g/dL (ref 30.0–36.0)
MCV: 92.1 fL (ref 78.0–100.0)
Platelets: 276 10*3/uL (ref 150–400)
RBC: 3.17 MIL/uL — ABNORMAL LOW (ref 3.87–5.11)
RDW: 16.9 % — ABNORMAL HIGH (ref 11.5–15.5)
WBC: 6.2 10*3/uL (ref 4.0–10.5)

## 2014-01-28 LAB — BASIC METABOLIC PANEL
BUN: 16 mg/dL (ref 6–23)
CALCIUM: 8.5 mg/dL (ref 8.4–10.5)
CO2: 28 meq/L (ref 19–32)
Chloride: 104 mEq/L (ref 96–112)
Creatinine, Ser: 0.47 mg/dL — ABNORMAL LOW (ref 0.50–1.10)
GFR calc Af Amer: >90 mL/min (ref >90)
GFR calc non Af Amer: >90 mL/min (ref >90)
Glucose, Bld: 240 mg/dL — ABNORMAL HIGH (ref 70–99)
Potassium: 3.1 mEq/L — ABNORMAL LOW (ref 3.7–5.3)
Sodium: 143 mEq/L (ref 137–147)

## 2014-01-28 MED ORDER — ENOXAPARIN SODIUM 40 MG/0.4ML ~~LOC~~ SOLN
40.0000 mg | SUBCUTANEOUS | Status: DC
  Administered 2014-01-28 – 2014-02-04 (×8): 40 mg via SUBCUTANEOUS
  Filled 2014-01-28 (×9): qty 0.4

## 2014-01-28 MED ORDER — POTASSIUM CHLORIDE 20 MEQ/15ML (10%) PO LIQD
30.0000 meq | ORAL | Status: AC
  Administered 2014-01-28 (×2): 30 meq
  Filled 2014-01-28 (×2): qty 30

## 2014-01-28 MED ORDER — POTASSIUM CHLORIDE 20 MEQ/15ML (10%) PO LIQD
40.0000 meq | Freq: Every day | ORAL | Status: DC
Start: 2014-01-28 — End: 2014-02-02
  Administered 2014-01-29 – 2014-02-02 (×5): 40 meq
  Filled 2014-01-28 (×5): qty 30

## 2014-01-28 NOTE — Consult Note (Signed)
Patient [name]      DOB: 1941/07/19      QIO:[id number]     Consult Note from the Palliative Medicine Team at [facility]    Consult Requested by: Dr. Bonney Aid    PCP: Terald Sleeper, MD Reason for Consultation: [facility]     Phone Number:[phone]  Assessment of patients Current state: 73 yr white female oxygen dependent prior to admission but failure functional.  She has been in and out of assisted settings as she needs some assistance and her daughter works.  Family updated on medical facts.  She my note form date of service.  Family feels that patient's anxiety is the limiting factor. They relate that she is very sensitive to medications and has failed standard drugs like ativan and klonopin but does very well with xanax.  Family would like to continue to treat the treatable .  They seem to understand that if all fails that they would likely not progress on to trach or LTAC, but they are not ready to jump to DNR and extubate to comfort yet.  They would like to see how she does with her wean in the am and make decisions from there.   Goals of Care: 1.  Code Status: Full code   2. Scope of Treatment: Treat the treatable . Continue to try to optimize for extubation.   4. Disposition: to be determined   3. Symptom Management:   1. Anxiety/Agitation: currently on all home meds except xanax. Consider restarting place of Versed,might help 2. Pain:Fentany prn 3. Fever: continue antibioitics and tylenol prn 4. Continue tube feeds   4. Psychosocial: described as very high strung and spunky, very anxious at baseline,very close to her granddaughters.  Two daughters [name] and Eunice Blase.  5. Spiritual: offered Spiritual care.        Patient Documents Completed or Given: Document Given Completed  Advanced Directives Pkt    MOST    DNR    Gone from My Sight    Hard Choices      Brief HPI:  73 yr old white female oxygen dependent on intermittent pulse oxygen at  home. She has a serious history of anxiety.  We have been asked to assist with goals of care as patient is having difficulty weaning from vent .   ROS: unable to do so due to intubated    PMH:  Past Medical History  Diagnosis Date  . Anxiety   . Arthritis   . COPD (chronic obstructive pulmonary disease)   . GERD (gastroesophageal reflux disease)   . Hyperlipidemia   . Osteoporosis   . DVT (deep venous thrombosis)   . PE (pulmonary thromboembolism)   . Hypertension      PT DENIES....ON NO  MEDS  . Diabetes mellitus   . Cancer 1969    cervical  . Pneumonia     h/o  . Bronchitis     h/o  . Blood transfusion   . Anemia   . Bipolar affect, depressed      PSH: Past Surgical History  Procedure Laterality Date  . Vena cava filter placement    . 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 Eye surgery      CATARACTS  . Achilles tendon lengthening  11/18/11    and repair w/posterior tibial tendon lengthening; right  foot  . Cholecystectomy    . Abdominal hysterectomy  1969    "womb taken out for cervical  cancer"  . Cataract extraction, bilateral  ~ 2008  . Tonsillectomy      "as a child"  . Achilles tendon surgery  11/18/2011    Procedure: ACHILLES TENDON REPAIR;  Surgeon: Toni Arthurs, MD;  Location: [facility] OR;  Service: Orthopedics;  Laterality: Right;  Right Posterior Tibial Tendon Lenghtening and Tendon Achilles Lenghtening    I have reviewed the FH and SH and  If appropriate update it with new information. Allergies  Allergen Reactions  . Benzodiazepines Other (See Comments)    Mood dysregulation  (on MAR)  . Morphine And Related Other (See Comments)    Pain dysregulation Mood disruption  . Olanzapine     Hallucinations and disorientation  . Sulfamethoxazole-Trimethoprim Hives   Scheduled Meds: . acetaminophen (TYLENOL) oral liquid 160 mg/5 mL  650 mg Oral Once  . antiseptic oral rinse  15 mL Mouth Rinse QID  . budesonide  0.25 mg Nebulization BID  .  chlorhexidine  15 mL Mouth Rinse BID  . enoxaparin (LOVENOX) injection  40 mg Subcutaneous Q24H  . feeding supplement (PRO-STAT SUGAR FREE 64)  30 mL Per Tube QID  . feeding supplement (VITAL HIGH PROTEIN)  1,000 mL Per Tube Q24H  . furosemide  40 mg Intravenous Daily  . gabapentin  600 mg Per Tube Q8H  . imipenem-cilastatin  500 mg Intravenous Q8H  . insulin aspart  0-20 Units Subcutaneous Q4H  . insulin glargine  20 Units Subcutaneous Daily  . ipratropium-albuterol  3 mL Nebulization Q6H  . metoprolol tartrate  50 mg Oral BID  . multivitamin  5 mL Per Tube Daily  . oxyCODONE  5 mg Oral BID  . potassium chloride  40 mEq Per Tube Daily  . QUEtiapine  200 mg Per Tube BID  . ranitidine  150 mg Per Tube BID  . traZODone  100 mg Per Tube QHS  . venlafaxine  75 mg Per Tube BID   Continuous Infusions: . propofol 65 mcg/kg/min (01/28/14 1633)   PRN Meds:.sodium chloride, acetaminophen (TYLENOL) oral liquid 160 mg/5 mL, albuterol, fentaNYL, hydrALAZINE, metoprolol, midazolam    BP 103/49  Pulse 105  Temp(Src) 99.3 F (37.4 C) (Core (Comment))  Resp 16  Ht 5\' 2"  (1.575 m)  Wt 87.7 kg (193 lb 5.5 oz)  BMI 35.35 kg/m2  SpO2 92%   PPS: can't determine at this time, functional at least 50-60 % prior to admit   Intake/Output Summary (Last 24 hours) at 01/28/14 1736 Last data filed at 01/28/14 1426  Gross per 24 hour  Intake 1835.6 ml  Output   3320 ml  Net -1484.4 ml   LBM: 2/9  Physical Exam:  General: wide eyed, slightly agitated, intubated, can shake head yes and no HEENT: Perrl, eomi, anicteric, mmm Chest:   Reasonable air entry ,no wheezing CVS: tachy , S1, S2 Abdomen:soft not tender not distended Ext: warm, trace edema, no mottling Neuro:anxious appearing  Labs: CBC    Component Value Date/Time   WBC 6.2 01/28/2014 0540   RBC 3.17* 01/28/2014 0540   HGB 8.9* 01/28/2014 0540   HCT 29.2* 01/28/2014 0540   PLT 276 01/28/2014 0540   MCV 92.1 01/28/2014 0540   MCH 28.1  01/28/2014 0540   MCHC 30.5 01/28/2014 0540   RDW 16.9* 01/28/2014 0540   LYMPHSABS 1.0 01/21/2014 1139   MONOABS 0.8 01/21/2014 1139   EOSABS 0.1 01/21/2014 1139   BASOSABS 0.0 01/21/2014 1139     CMP     Component Value Date/Time  NA 143 01/28/2014 0540   K 3.1* 01/28/2014 0540   CL 104 01/28/2014 0540   CO2 28 01/28/2014 0540   GLUCOSE 240* 01/28/2014 0540   BUN 16 01/28/2014 0540   CREATININE 0.47* 01/28/2014 0540   CREATININE 0.73 03/24/2011 1614   CALCIUM 8.5 01/28/2014 0540   PROT 7.1 01/21/2014 1139   ALBUMIN 3.2* 01/21/2014 1139   AST 30 01/21/2014 1139   ALT 14 01/21/2014 1139   ALKPHOS 57 01/21/2014 1139   BILITOT 0.2* 01/21/2014 1139   GFRNONAA >90 01/28/2014 0540   GFRAA >90 01/28/2014 0540    Chest Xray Reviewed/Impressions: No apparent change in life-support lines, endotracheal tube tip  projects at the carina, consider 2 cm of retraction.  Slightly decreased airspace opacities may reflect improving  pneumonia with trace residual pleural effusions.    Time In Time Out Total Time Spent with Patient Total Overall Time  400 pm 545pm  20 min 95 min    Greater than 50%  of this time was spent counseling and coordinating care related to the above assessment and plan.   Raley Novicki L. Ladona Ridgel, MD MBA The Palliative Medicine Team at [facility] Phone: [phone] Pager: [phone]

## 2014-01-28 NOTE — Progress Notes (Signed)
Utilization review completed.  

## 2014-01-28 NOTE — Consult Note (Signed)
Patient VQ:[facility] [name]      DOB: [birth year]      TVM:[id number]   Summary of Goals of Care, full note to follow:  Met with two daughters- [name] and Jackelyn Poling and two granddaughters Myriam Jacobson and [place]  Family describes [name] and strong willed but anxious woman who up until the last two admissions had required minimal assistance with ADL. She used pulse oxygen and was able to get around with her family.    [name] had recent hospital stay for ESBL UTI and returned with sepsis.  She was intubated and self extubated Wed. She was reintubated and is being evaluated for extubation potential. I met with family regarding realistic goals of care.   At this time, since the patients chest xray is starting to clear and they were able to start her back on home meds for anxiety and bipolar disease.  Family would like to collaborate with the pulmonary team on optimizing her health toward extubation.  They understand that she might need a longer time and that an LTAC might be suggested . Overall, they thin if one of the family could be with her when the do her weans that she might be less anxious and more successful.  They would like to talk with pulmonary in that am about where we might be and will be thinking about possibly accepting extubate ultimately with no reintubation and change to DNR status.  We will talk through that all again in the am when I get an update from pulmonary.  Recommend:  1.  Full code for now  2.  Optimize clinically for potential to extubate, then collaborate with family on timing and goals of potential not reintubate and possible DNr.  3.  Bipolar and anxiety agree with resuming home meds.  Note patient very sensitive to benzos. Ativan makes her hyper. Klonipin does nothing. Resume Xanax post extubation for best effect  Discussed with Dr. Lamonte Sakai  Total time 400 pm - 545 pm  Pasha Gadison L. Lovena Le, MD MBA The Palliative Medicine Team at [facility] Phone: [phone] Pager: [phone]

## 2014-01-28 NOTE — Progress Notes (Signed)
NUTRITION FOLLOW UP  Intervention:   - Continue TF of Vital High Protein to 57m/hr via OGT with Prostat 333mQID. This provides 1120 calories, 123g protein, and 60241mree water and meet 60% estimated calorie needs and 100% estimated protein needs, based on ASPEN guidelines for permissive underfeeding in critically ill obese individuals  - Recommend MD replace pt's low potassium  - Will continue to monitor   Nutrition Dx:   Inadequate oral intake related to inability to eat as evidenced by NPO - ongoing   Goal:   Enteral nutrition to provide 60-70% of estimated calorie needs (22-25 kcals/kg ideal body weight) and 100% of estimated protein needs, based on ASPEN guidelines for permissive underfeeding in critically ill obese individuals - met  Monitor:   Weights, labs, TF advancement/tolerance, vent status   Assessment:   Admitted with fever, shortness of breath, and altered mental status. From [facility]. [facility] pt was eating very poorly at her nursing home and weighed 203 pounds. Intubated yesterday for respiratory failure.   2/4 - Received consult for TF management. Due to pt's low potassium, magnesium, and phosphorus and very poor intake last admission, pt at risk of refeeding syndrome. Will advance TF very slowly.   2/5 - Remains intubated. Per conversation with RN, pt had 150m65m residuals last night and 150ml41mresiduals this morning. Magnesium WNL, however potassium and phosphorus remain low. Phosphorus down slightly compared to yesterday, likely due to refeeding. Getting 4 runs of KCl and IV sodium phosphate.   2/6 - Remains intubated. Started on TF of Vital High Protein 2/4 via OGT at 20ml/37mdid not advance due to refeeding risk. Potassium and phosphorus remain low. Discussed with pharmacist - pt getting aggressive replacement, IV and oral potassium replacement and IV phosphorus replacement - expect potassium and  phosphorus to improve. Pt's weight up 17 pounds since admission. Per conversation with RN, pt tolerating TF well with only 70ml r19muals this morning.   2/9 - Reviewed events since last RD note. Pt with some agititation starting 2/7 which persisted 2/8. Remains intubated. Phosphorus and magnesium WNL, potassium low. Per RN, pt tolerating TF well with only 70ml re67mals today. Plan is for possible extubation or further sedation today per RN. Pt in soft bilateral wrist restraints.   Patient is currently intubated on ventilator support.  MV: 13.5 L/min Temp (24hrs), Avg:99.8 F (37.7 C), Min:99.3 F (37.4 C), Max:100 F (37.8 C)  Propofol: 15.7 ml/hr - provides 414 calories   TF: Vital High Protein to 30ml/hr 75mOGT with Prostat 30ml QID.78ms will provide 1120 calories, 123g protein, and 602ml free 8mr and meet 60% estimated calorie needs and 100% estimated protein needs.   CBGs elevated, diabetic coordinator recommended ICU hyperglycemia protocol on 2/6.    Potassium  Date/Time Value Range Status  01/28/2014  5:40 AM 3.1* 3.7 - 5.3 mEq/L Final  01/27/2014  5:30 AM 3.2* 3.7 - 5.3 mEq/L Final  01/26/2014  4:50 AM 3.3* 3.7 - 5.3 mEq/L Final    Phosphorus  Date/Time Value Range Status  01/26/2014  4:50 AM 2.9  2.3 - 4.6 mg/dL Final  01/25/2014  8:00 PM 3.1  2.3 - 4.6 mg/dL Final  01/25/2014  3:54 AM 1.9* 2.3 - 4.6 mg/dL Final    Magnesium  Date/Time Value Range Status  01/27/2014  5:30 AM 1.9  1.5 - 2.5 mg/dL Final  01/26/2014  4:50 AM 1.8  1.5 - 2.5 mg/dL Final  01/25/2014  3:54 AM 1.8  1.5 - 2.5 mg/dL Final      Height: Ht Readings from Last 1 Encounters:  01/21/14 _0  (1.575 m)    Weight Status:   Wt Readings from Last 1 Encounters:  01/27/14 193 lb 5.5 oz (87.7 kg)  Admit wt:        180 lb (81.6 kg) Net I/Os: -5.4L  Re-estimated needs:  Kcal: 1907 Protein: 100-120g Fluid: >1.9L/day  Skin: +1 generalized edema, +1 RUE, LUE edema, +2 RLE, LLE edema  Diet Order:  NPO   Intake/Output Summary (Last 24 hours) at 01/28/14 1003 Last data filed at 01/28/14 0900  Gross per 24 hour  Intake 1692.6 ml  Output   2500 ml  Net -807.4 ml    Last BM: 2/8   Labs:   Recent Labs Lab 01/25/14 0354 01/25/14 2000 01/26/14 0450 01/27/14 0530 01/28/14 0540  NA 140 141 141 143 143  K 2.8* 3.1* 3.3* 3.2* 3.1*  CL 97 100 101 103 104  CO2 _1 BUN _2 CREATININE 0.42* 0.43* 0.42* 0.53 0.47*  CALCIUM 7.8* 8.1* 8.4 8.5 8.5  MG 1.8  --  1.8 1.9  --   PHOS 1.9* 3.1 2.9  --   --   GLUCOSE 276* 169* 210* 293* 240*    CBG (last 3)   Recent Labs  01/28/14 0003 01/28/14 0336 01/28/14 0820  GLUCAP 217* 264* 197*    Scheduled Meds: . acetaminophen (TYLENOL) oral liquid 160 mg/5 mL  650 mg Oral Once  . antiseptic oral rinse  15 mL Mouth Rinse QID  . budesonide  0.25 mg Nebulization BID  . chlorhexidine  15 mL Mouth Rinse BID  . feeding supplement (PRO-STAT SUGAR FREE 64)  30 mL Per Tube QID  . feeding supplement (VITAL HIGH PROTEIN)  1,000 mL Per Tube Q24H  . furosemide  40 mg Intravenous Daily  . gabapentin  600 mg Per Tube Q8H  . heparin  5,000 Units Subcutaneous Q8H  . imipenem-cilastatin  500 mg Intravenous Q8H  . insulin aspart  0-20 Units Subcutaneous Q4H  . insulin glargine  20 Units Subcutaneous Daily  . ipratropium-albuterol  3 mL Nebulization Q6H  . metoprolol tartrate  50 mg Oral BID  . multivitamin  5 mL Per Tube Daily  . oxyCODONE  5 mg Oral BID  . potassium chloride  30 mEq Per Tube Q4H  . QUEtiapine  200 mg Per Tube BID  . ranitidine  150 mg Per Tube BID  . traZODone  100 mg Per Tube QHS  . venlafaxine  75 mg Per Tube BID    Continuous Infusions: . propofol 30 mcg/kg/min (01/28/14 0841)    Mikey College MS, RD, LDN [phone] Pager [phone] After Hours Pager

## 2014-01-28 NOTE — Progress Notes (Signed)
[facility] ADULT ICU REPLACEMENT PROTOCOL FOR AM LAB REPLACEMENT ONLY  The patient does apply for the [facility] Adult ICU Electrolyte Replacment Protocol based on the criteria listed below:   1. Is GFR >/= 40 ml/min? yes  Patient's GFR today is >90 2. Is urine output >/= 0.5 ml/kg/hr for the last 6 hours? yes Patient's UOP is 0.86 ml/kg/hr 3. Is BUN < 60 mg/dL? yes  Patient's BUN today is 16 4. Abnormal electrolyte(s): Potassium 5. Ordered repletion with: Potassium per protocol [name] P 01/28/2014 6:24 AM

## 2014-01-28 NOTE — Progress Notes (Signed)
Thank you for consulting the Palliative Medicine Team at [facility] to meet your patient's and family's needs.   The reason that you asked [place] to see your patient is  For Clarification of GOC and options  We have scheduled your patient for a meeting: Today 01-28-14 @ 4pm with Dr Billey Chang  The Surrogate decision make is: [name] Wright/ daughter  Your patient is able/unable to participate: unable  Wadie Lessen NP  Palliative Medicine Team Team Phone # [phone] Pager [phone]

## 2014-01-28 NOTE — Progress Notes (Signed)
Name: [name] MRN: [id number] DOB: 03-30-1941    ADMISSION DATE:  01/21/2014 CONSULTATION DATE:  01/21/14  REFERRING MD :  Gabriel Cirri PRIMARY SERVICE: PCCM  CHIEF COMPLAINT:  Fever, hypoxia  BRIEF PATIENT DESCRIPTION: 73 y/o resident of [facility] found by staff with fever and hypoxia. Taken to ED on NRB 100% with improvement. PCCM asked to see patient for respiratory needs and hypotension refractory to IVF. Recent admission to hospital 12/25/13 for ESBL E. Coli>>Urosepsis.   has a past medical history of Anxiety; Arthritis; COPD (chronic obstructive pulmonary disease); GERD (gastroesophageal reflux disease); Hyperlipidemia; Osteoporosis; DVT (deep venous thrombosis); PE (pulmonary thromboembolism); Hypertension; Diabetes mellitus; Cancer (1969); Pneumonia; Bronchitis; Blood transfusion; Anemia; and Bipolar affect, depressed.   has past surgical history that includes Vena cava filter placement; CANCER OF WOMB; Eye surgery; Achilles tendon lengthening (11/18/11); Cholecystectomy; Abdominal hysterectomy (1969); Cataract extraction, bilateral (~ 2008); Tonsillectomy; and Achilles tendon surgery (11/18/2011).   SIGNIFICANT EVENTS / STUDIES:  1/12 - CT angio chest >emphysema. Right consolidation. 2 nodules in right apex (f/u CT 4-6 weeks recommended) ........................................................................................... 2/02 - Admitted to PCCM, [place] placed, shock 2/03 - intubated for resp distress 2/07 - AF w RVR 2/09 - failed SBT  LINES / TUBES: PIV 2/2>>> LIJ 2/2>>> Foley 2/2>>> 2/3 ETT>> 2/5 (self extub),2/5 ETT >>   CULTURES: Blood 2/2>>>ng Urine 2/2>>> (recent ESBL E. Coli isolated 12/31/13)>>e coli>> ESBL positive, sens to zosyn and imipenem resp virus 2/2>>negative  ANTIBIOTICS: Vancomycin 2/2>>> 2/5 Primaxin 2/2>>>   SUBJECTIVE:  RN reports intermittent periods of agitation, pulled NGT out.  Does not tolerate SBT due to tachycardia / tachypnea. (Staff MD  note: -6L negativve volume since 7 days)  VITAL SIGNS: Temp:  [99.3 F (37.4 C)-100 F (37.8 C)] 99.3 F (37.4 C) (02/09 0700) Pulse Rate:  [111-142] 115 (02/09 0700) Resp:  [12-28] 17 (02/09 0700) BP: (93-160)/(40-107) 138/107 mmHg (02/09 0700) SpO2:  [90 %-99 %] 97 % (02/09 0700) FiO2 (%):  [40 %] 40 % (02/09 1000)  HEMODYNAMICS:    VENTILATOR SETTINGS: Vent Mode:  [-] PRVC FiO2 (%):  [40 %] 40 % Set Rate:  [16 bmp] 16 bmp Vt Set:  [500 mL] 500 mL PEEP:  [5 cmH20] 5 cmH20 Pressure Support:  [8 cmH20] 8 cmH20 Plateau Pressure:  [16 cmH20-17 cmH20] 16 cmH20  INTAKE / OUTPUT: Intake/Output     02/08 0701 - 02/09 0700 02/09 0701 - 02/10 0700   I.V. (mL/kg) 941.6 (10.7)    Other 200    NG/GT 690    IV Piggyback 200    Total Intake(mL/kg) 2031.6 (23.2)    Urine (mL/kg/hr) 2750 (1.3) 150 (0.5)   Total Output 2750 150   Net -718.4 -150          PHYSICAL EXAMINATION: General appearance - anxious appearing, wide awake on propofol Neuro: awake, alert on propofol, MAE HEENT:  OETT, mm pink/moist, no jvd, L IJ c/d/i Chest:  distant breath sounds bilaterally CV: normal rate, regular rhythm, normal S1, S2, no murmurs, rubs, clicks or gallops Abdomen - soft, nontender, nondistended, no masses or organomegaly Neuro: anxious, non focal, tremor + Musculoskeletal: no joint tenderness, deformity or swelling Extremities: peripheral pulses normal, no pedal edema, no clubbing or cyanosis Skin: normal coloration and turgor, no rashes, no suspicious skin lesions noted  PULMONARY  Recent Labs Lab 01/21/14 2150 01/22/14 0101 01/22/14 1815 01/23/14 0352 01/25/14 0433  PHART 7.297* 7.374 7.307* 7.361 7.470*  PCO2ART 58.6* 51.5* 54.8* 46.3* 42.6  PO2ART 81.2  90.2 293.0* 70.7* 67.5*  HCO3 27.0* 28.5* 26.6* 25.4* 30.6*  TCO2 25.5 26.4 24.9 23.8 28.0  O2SAT 93.9 96.3 99.6 93.9 94.3    CBC  Recent Labs Lab 01/26/14 0450 01/27/14 0530 01/28/14 0540  HGB 9.4* 8.9* 8.9*  HCT  29.4* 29.0* 29.2*  WBC 7.3 7.4 6.2  PLT 209 258 276    COAGULATION  Recent Labs Lab 01/21/14 1139  INR 0.92    CARDIAC  No results found for this basename: TROPONINI,  in the last 168 hours No results found for this basename: PROBNP,  in the last 168 hours   CHEMISTRY  Recent Labs Lab 01/23/14 0415 01/24/14 0450 01/24/14 1730 01/25/14 0354 01/25/14 2000 01/26/14 0450 01/27/14 0530 01/28/14 0540  NA 141 139 142 140 141 141 143 143  K 3.3* 3.5* 2.6* 2.8* 3.1* 3.3* 3.2* 3.1*  CL 105 102 98 97 100 101 103 104  CO2 26 27 29 31 31 30 27 28   GLUCOSE 208* 246* 269* 276* 169* 210* 293* 240*  BUN 6 5* 6 6 10 10 18 16   CREATININE 0.40* 0.42* 0.44* 0.42* 0.43* 0.42* 0.53 0.47*  CALCIUM 7.6* 7.7* 7.6* 7.8* 8.1* 8.4 8.5 8.5  MG 1.4* 1.6  --  1.8  --  1.8 1.9  --   PHOS 2.0* 1.9* 5.6* 1.9* 3.1 2.9  --   --    Estimated Creatinine Clearance: 66.3 ml/min (by C-G formula based on Cr of 0.47).   LIVER  Recent Labs Lab 01/21/14 1139  AST 30  ALT 14  ALKPHOS 57  BILITOT 0.2*  PROT 7.1  ALBUMIN 3.2*  INR 0.92     INFECTIOUS  Recent Labs Lab 01/21/14 1640 01/21/14 1820 01/22/14 0420 01/22/14 1100 01/23/14 0415 01/23/14 0645  LATICACIDVEN 4.8*  --   --  1.0  --  0.9  PROCALCITON  --  2.20 1.71  --  0.68  --      ENDOCRINE CBG (last 3)   Recent Labs  01/28/14 0003 01/28/14 0336 01/28/14 0820  GLUCAP 217* 264* 197*         IMAGING x48h  Dg Chest Port 1 View  01/28/2014   CLINICAL DATA:  Pneumonia.  EXAM: PORTABLE CHEST - 1 VIEW  COMPARISON:  Chest radiograph January 27, 2014  FINDINGS: Endotracheal tube tip projects at the carina, unchanged. Nasogastric tube past the gastroesophageal junction, distal tip not imaged. Left internal jugular central venous catheter with distal tip projecting in proximal superior vena cava. Interstitial prominence with patchy airspace opacities are decreased. Trace pleural effusions. No pneumothorax. Cardiomediastinal  silhouette is nonsuspicious. Soft tissue planes and included osseous structures are unchanged.  IMPRESSION: No apparent change in life-support lines, endotracheal tube tip projects at the carina, consider 2 cm of retraction.  Slightly decreased airspace opacities may reflect improving pneumonia with trace residual pleural effusions.   Electronically Signed   By: Elon Alas   On: 01/28/2014 06:53   Dg Chest Port 1 View  01/27/2014   CLINICAL DATA:  Pneumonia  EXAM: PORTABLE CHEST - 1 VIEW  COMPARISON:  01/26/2014  FINDINGS: An endotracheal tube is noted approximately 1 cm above the carina. This could be withdrawn 2-3 cm. A nasogastric catheter is noted within the stomach and stable. A left central venous line is again seen and stable. The cardiac shadow is at the upper limits of normal. Patchy infiltrates are identified bilaterally but worse in the right mid and lower lung. No to focal abnormality is same.  IMPRESSION: Endotracheal tube just above the carina. This could be withdrawn 2-3 cm.  Patchy infiltrates bilaterally.  No new focal abnormality is noted.   Electronically Signed   By: Inez Catalina M.D.   On: 01/27/2014 07:19      ASSESSMENT / PLAN:  PULMONARY A: Acute Respiratory Failure COPD HCAP vs ARDS vs cardiogenic edema; suspect ALI P:   -Goal SpO2 >92% -BD's > duonebs + pulmicort bid -Vent bundle -Diuresis to goal CVP 5-7  CARDIOVASCULAR A:  Septic shock - resolved Hx HTN  P:  -Lopressor 50 bid & use 5 q6h prn -daily lasix 40 mg with scheduled K   RENAL A:   E coli ESBL UTI AG metabolic acidosis 15 resolved Hypophosphatemia/ hypokalemia P:   -Monitor BMET -Replace lytes as indicated -scheduled K with lasix   GASTROINTESTINAL A:   Hx GERD SUP P:   -Pepcid -TFs -Reglan x 4 doses  HEMATOLOGIC A:  Anemia, stable (asymptomatic), dvt prevention P:  -Follow CBC -Sub  q heparin  INFECTIOUS A:  Septic shock resolved -  secondary to e coli  UTI Leukocytosis - improving Hx E. Coli urosepsis, ESBL - undertreated from last admission.  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) Hyperglycemia P:   -SSI - change to resistant scale 2/6 -Held metformin, will restart when taking PO's -Lantus    NEUROLOGIC A:   Acute encephalopathy and agitation Anxiety Bipolar P:   -propofol + prn fentanyl and versed -continue effexor, seroquel 200 bid, trazadone 100, neurontin 600 q 8h, to hopefully allow a smoother wake-up and facilitate extubation  Noe Gens, NP-C [facility] Pgr: [phone] or [phone]    STAFF NOTE: I, Dr Ann Lions have personally reviewed patient's available data, including medical history, events of note, physical examination and test results as part of my evaluation. I have discussed with resident/NP and other care providers such as pharmacist, RN and RRT.  In addition,  I personally evaluated patient and elicited key findings of acute resp failure, septitc shiock, SNF resident, significant psych hx. She is d7 on vent and at high risk for chronic critical illenss and needing LTAC with high one year mortality and high morbidity. Will get palliative care for goals of care - called Wadie Lessen.  Rest per NP/medical resident whose note is outlined above and that I agree with  The patient is critically ill with multiple organ systems failure and requires high complexity decision making for assessment and support, frequent evaluation and titration of therapies, application of advanced monitoring technologies and extensive interpretation of multiple databases.   Critical Care Time devoted to patient care services described in this note is  35  Minutes.  Dr. Brand Males, M.D., [facility].C.P Pulmonary and Critical Care Medicine Staff Physician [facility] Pager: [phone], If no answer or between  15:00h - 7:00h:  call [phone]  01/28/2014 11:43 AM

## 2014-01-29 ENCOUNTER — Inpatient Hospital Stay (HOSPITAL_COMMUNITY): Payer: Medicare Other

## 2014-01-29 DIAGNOSIS — J9600 Acute respiratory failure, unspecified whether with hypoxia or hypercapnia: Secondary | ICD-10-CM

## 2014-01-29 DIAGNOSIS — G9340 Encephalopathy, unspecified: Secondary | ICD-10-CM

## 2014-01-29 LAB — CBC
HCT: 27.5 % — ABNORMAL LOW (ref 36.0–46.0)
Hemoglobin: 8.5 g/dL — ABNORMAL LOW (ref 12.0–15.0)
MCH: 28.8 pg (ref 26.0–34.0)
MCHC: 30.9 g/dL (ref 30.0–36.0)
MCV: 93.2 fL (ref 78.0–100.0)
Platelets: 295 10*3/uL (ref 150–400)
RBC: 2.95 MIL/uL — ABNORMAL LOW (ref 3.87–5.11)
RDW: 17 % — ABNORMAL HIGH (ref 11.5–15.5)
WBC: 6.2 10*3/uL (ref 4.0–10.5)

## 2014-01-29 LAB — BASIC METABOLIC PANEL
BUN: 21 mg/dL (ref 6–23)
CALCIUM: 8.5 mg/dL (ref 8.4–10.5)
CO2: 31 mEq/L (ref 19–32)
Chloride: 107 mEq/L (ref 96–112)
Creatinine, Ser: 0.44 mg/dL — ABNORMAL LOW (ref 0.50–1.10)
GFR calc Af Amer: >90 mL/min (ref >90)
GFR calc non Af Amer: >90 mL/min (ref >90)
GLUCOSE: 174 mg/dL — AB (ref 70–99)
Potassium: 3.4 mEq/L — ABNORMAL LOW (ref 3.7–5.3)
SODIUM: 147 meq/L (ref 137–147)

## 2014-01-29 LAB — GLUCOSE, CAPILLARY
GLUCOSE-CAPILLARY: 204 mg/dL — AB (ref 70–99)
Glucose-Capillary: 154 mg/dL — ABNORMAL HIGH (ref 70–99)
Glucose-Capillary: 162 mg/dL — ABNORMAL HIGH (ref 70–99)
Glucose-Capillary: 219 mg/dL — ABNORMAL HIGH (ref 70–99)
Glucose-Capillary: 146 mg/dL — ABNORMAL HIGH (ref 70–99)
Glucose-Capillary: 146 mg/dL — ABNORMAL HIGH (ref 70–99)

## 2014-01-29 LAB — MAGNESIUM: Magnesium: 2 mg/dL (ref 1.5–2.5)

## 2014-01-29 LAB — PHOSPHORUS: Phosphorus: 3.9 mg/dL (ref 2.3–4.6)

## 2014-01-29 LAB — PRO B NATRIURETIC PEPTIDE: Pro B Natriuretic peptide (BNP): 380.5 pg/mL — ABNORMAL HIGH (ref 0–125)

## 2014-01-29 MED ORDER — TRAZODONE HCL 150 MG PO TABS
150.0000 mg | ORAL_TABLET | Freq: Every day | ORAL | Status: DC
  Administered 2014-01-29 – 2014-02-05 (×7): 150 mg
  Filled 2014-01-29 (×9): qty 1

## 2014-01-29 NOTE — Progress Notes (Signed)
Name: [name] MRN: [id number] DOB: August 16, 1941    ADMISSION DATE:  01/21/2014 CONSULTATION DATE:  01/21/14  REFERRING MD :  Gabriel Cirri PRIMARY SERVICE: PCCM  CHIEF COMPLAINT:  Fever, hypoxia  BRIEF PATIENT DESCRIPTION: 73 y/o resident of [facility] found by staff with fever and hypoxia. Taken to ED on NRB 100% with improvement. PCCM asked to see patient for respiratory needs and hypotension refractory to IVF. Recent admission to hospital 12/25/13 for ESBL E. Coli>>Urosepsis.  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 Right consolidation. 2 nodules in right apex (f/u CT 4-6 weeks recommended) ........................................................................................... 2/02 - Admitted to PCCM, LIJ placed, shock 2/03 - intubated for resp distress 2/07 - AF w RVR 2/09 - failed SBT  LINES / TUBES: PIV 2/2>>> LIJ 2/2>>> Foley 2/2>>> 2/3 ETT>> 2/5 (self extub),2/5 ETT >>   CULTURES: Blood 2/2>>>ng Urine 2/2>>> (recent ESBL E. Coli isolated 12/31/13)>>e coli>> ESBL positive, sens to zosyn and imipenem resp virus 2/2>>negative  ANTIBIOTICS: Vancomycin 2/2>>> 2/5 Primaxin 2/2>>>   SUBJECTIVE:  RN reports significant diarrhea, no other acute events  VITAL SIGNS: Temp:  [97 F (36.1 C)-99.9 F (37.7 C)] 98.2 F (36.8 C) (02/10 0900) Pulse Rate:  [87-126] 99 (02/10 0900) Resp:  [16-23] 20 (02/10 0900) BP: (100-146)/(33-100) 144/63 mmHg (02/10 0900) SpO2:  [91 %-100 %] 100 % (02/10 0900) FiO2 (%):  [30 %-40 %] 30 % (02/10 0838) Weight:  [189 lb 13.1 oz (86.1 kg)-193 lb 5.5 oz (87.7 kg)] 189 lb 13.1 oz (86.1 kg) (02/10 0147)  HEMODYNAMICS:    VENTILATOR SETTINGS: Vent Mode:  [-] PRVC FiO2 (%):  [30 %-40 %] 30 % Set Rate:  [16 bmp] 16 bmp Vt Set:  [500 mL] 500 mL PEEP:  [5 cmH20] 5 cmH20 Plateau Pressure:  [14 cmH20-17 cmH20] 15 cmH20  INTAKE / OUTPUT: Intake/Output     02/09  0701 - 02/10 0700 02/10 0701 - 02/11 0700   I.V. (mL/kg) 1412.8 (16.4) 138 (1.6)   Other     NG/GT 840 52.5   IV Piggyback 300    Total Intake(mL/kg) 2552.8 (29.6) 190.5 (2.2)   Urine (mL/kg/hr) 2935 (1.4) 160 (0.7)   Total Output 2935 160   Net -382.2 +30.5        Stool Occurrence 1 x      PHYSICAL EXAMINATION: General appearance - anxious appearing, wide awake on propofol Neuro: awake, alert on propofol, MAE HEENT:  OETT, mm pink/moist, no jvd, L IJ c/d/i Chest:  distant breath sounds bilaterally CV: normal rate, regular rhythm, normal S1, S2 Abdomen: soft, nontender, nondistended, no masses or organomegaly Neuro: anxious, non focal, tremor + Musculoskeletal: no joint tenderness, deformity or swelling Extremities: peripheral pulses normal, no pedal edema, no clubbing or cyanosis Skin: normal coloration and turgor, no rashes, no suspicious skin lesions noted  PULMONARY  Recent Labs Lab 01/22/14 1815 01/23/14 0352 01/25/14 0433  PHART 7.307* 7.361 7.470*  PCO2ART 54.8* 46.3* 42.6  PO2ART 293.0* 70.7* 67.5*  HCO3 26.6* 25.4* 30.6*  TCO2 24.9 23.8 28.0  O2SAT 99.6 93.9 94.3    CBC  Recent Labs Lab 01/27/14 0530 01/28/14 0540 01/29/14 0350  HGB 8.9* 8.9* 8.5*  HCT 29.0* 29.2* 27.5*  WBC 7.4 6.2 6.2  PLT 258 276 295   CARDIAC  No results found for this basename: TROPONINI,  in the last 168 hours  Recent Labs Lab 01/29/14 0350  PROBNP 380.5*   CHEMISTRY  Recent Labs Lab 01/24/14 0450 01/24/14 1730 01/25/14 0354 01/25/14 2000 01/26/14 0450 01/27/14 0530 01/28/14 0540 01/29/14 0350  NA 139 142 140 141 141 143 143 147  K 3.5* 2.6* 2.8* 3.1* 3.3* 3.2* 3.1* 3.4*  CL 102 98 97 100 101 103 104 107  CO2 $Re'27 29 31 31 30 27 28 31  'doJ$ GLUCOSE 246* 269* 276* 169* 210* 293* 240* 174*  BUN 5* $Remo'6 6 10 10 18 16 21  'Bbeyv$ CREATININE 0.42* 0.44* 0.42* 0.43* 0.42* 0.53 0.47* 0.44*  CALCIUM 7.7* 7.6* 7.8* 8.1* 8.4 8.5 8.5 8.5  MG 1.6  --  1.8  --  1.8 1.9  --  2.0  PHOS  1.9* 5.6* 1.9* 3.1 2.9  --   --  3.9   Estimated Creatinine Clearance: 65.7 ml/min (by C-G formula based on Cr of 0.44).  IMAGING x48h  Dg Chest Port 1 View  01/29/2014   CLINICAL DATA:  Hypoxia  EXAM: PORTABLE CHEST - 1 VIEW  COMPARISON:  January 28, 2014  FINDINGS: Endotracheal tube tip is 1.0 cm above the carina. Central catheter tip is in the superior vena cava. Nasogastric tube tip and side port are below the diaphragm. No pneumothorax.  There is patchy airspace consolidation in the left base, stable. There is atelectatic change in the right base, slightly less than on recent prior study. Heart size and pulmonary vascularity are normal.  IMPRESSION: Tube and catheter positions as described without pneumothorax. It may be prudent to consider retracting endotracheal tube approximately 3 cm. Persistent patchy airspace disease left base. Mild right base atelectasis.   Electronically Signed   By: Lowella Grip M.D.   On: 01/29/2014 07:01   Dg Chest Port 1 View  01/28/2014   CLINICAL DATA:  Pneumonia.  EXAM: PORTABLE CHEST - 1 VIEW  COMPARISON:  Chest radiograph January 27, 2014  FINDINGS: Endotracheal tube tip projects at the carina, unchanged. Nasogastric tube past the gastroesophageal junction, distal tip not imaged. Left internal jugular central venous catheter with distal tip projecting in proximal superior vena cava. Interstitial prominence with patchy airspace opacities are decreased. Trace pleural effusions. No pneumothorax. Cardiomediastinal silhouette is nonsuspicious. Soft tissue planes and included osseous structures are unchanged.  IMPRESSION: No apparent change in life-support lines, endotracheal tube tip projects at the carina, consider 2 cm of retraction.  Slightly decreased airspace opacities may reflect improving pneumonia with trace residual pleural effusions.   Electronically Signed   By: Elon Alas   On: 01/28/2014 06:53   ASSESSMENT / PLAN:  PULMONARY A: Acute  Respiratory Failure COPD HCAP vs ARDS vs cardiogenic edema; suspect ALI  P:   -Goal SpO2 >92% -BD's > duonebs + pulmicort bid -Vent bundle -Diuresis as renal fxn / bp permit  CARDIOVASCULAR A:  Septic shock - resolved Hx HTN  P:  -Lopressor 50 bid & use 5 q6h prn -daily lasix 40 mg with scheduled K   RENAL A:   E coli ESBL UTI AG metabolic acidosis 15 resolved Hypophosphatemia/ hypokalemia  P:   -Monitor BMET -Replace lytes as indicated -scheduled K with lasix   GASTROINTESTINAL A:   Hx GERD SUP  P:   -Pepcid -TFs -Reglan x 4 doses  HEMATOLOGIC A:  Anemia, stable (asymptomatic), dvt prevention  P:  -Follow CBC -Sub  q heparin  INFECTIOUS A:  Septic shock resolved -  secondary to e coli UTI Leukocytosis - improving Hx E. Coli urosepsis, ESBL - undertreated from last admission.   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  They do not want LTAC or trach.     Noe Gens, NP-C [facility] Pgr: [phone] or [phone]  01/29/2014 9:46 AM

## 2014-01-29 NOTE — Progress Notes (Signed)
ANTIBIOTIC CONSULT NOTE - FOLLOW UP  Pharmacy Consult for Primaxin Indication: Sepsis, ESBL E.coli UTI  Allergies  Allergen Reactions  . Benzodiazepines Other (See Comments)    Mood dysregulation  (on MAR)  . Morphine And Related Other (See Comments)    Pain dysregulation Mood disruption  . Olanzapine     Hallucinations and disorientation  . Sulfamethoxazole-Trimethoprim Hives    Patient Measurements: Height: 5\' 2"  (157.5 cm) Weight: 189 lb 13.1 oz (86.1 kg) IBW/kg (Calculated) : 50.1  Vital Signs: Temp: 98.2 F (36.8 C) (02/10 0900) Temp src: Core (Comment) (02/10 0400) BP: 135/54 mmHg (02/10 1019) Pulse Rate: 87 (02/10 1019) Intake/Output from previous day: 02/09 0701 - 02/10 0700 In: 2552.8 [I.V.:1412.8; NG/GT:840; IV Piggyback:300] Out: 2935 [Urine:2935]  Labs:  Recent Labs  01/27/14 0530 01/28/14 0540 01/29/14 0350  WBC 7.4 6.2 6.2  HGB 8.9* 8.9* 8.5*  PLT 258 276 295  CREATININE 0.53 0.47* 0.44*   Estimated Creatinine Clearance: 65.7 ml/min (by C-G formula based on Cr of 0.44).  No results found for this basename: VANCOTROUGH, VANCOPEAK, VANCORANDOM, GENTTROUGH, GENTPEAK, GENTRANDOM, TOBRATROUGH, TOBRAPEAK, TOBRARND, AMIKACINPEAK, AMIKACINTROU, AMIKACIN,  in the last 72 hours   Microbiology: 1/6 urine >> pan-sensitive E. Coli 1/12 urine >> E. Coli +ESBL+ 2/2 urine >100k E.coli +ESBL+ 2/2 blood x 2 >> NGF 2/2 MRSA PCR (+) 2/3 RSV panel: neg  Anti-infectives: 2/2 >> Cefepime x 1  2/2 >> Vanc >> 2/5 2/2 >> Primaxin >>   Assessment: 28 yoF hospitalized [facility]1/13[facility]15 with E.coli urosepsis with septic shock. Per notes from SNF on 1/23, pt was found with rash under breast and abdominal folds treated with nystatin cream. Pt presented to ED 2/2 with reported fever and SOB at SNF. Pt found febrile, tachycardic, hypotensive. Sepsis protocol started and empiric abx with Vanc, Cefepime. Of note, recent E.coli from urine culture was an ESBL producer and  patient was treated with Cefepime inpatient, changed to Cipro at discharge (not susceptible).    Day #9 Primaxin 500mg  IV q8h.  Tm24h: 99.3  WBC improved to WNL.  SCr wnl and stable, normalized CrCl ~58 ml/min (rounding SCr up to 1.0)  E.coli +ESBL growing from urine  Goal of Therapy:  Primaxin dose per weight/renal function  Plan:   Continue Primaxin 500mg  IV q8h  Continue to follow up renal function and planned length of therapy.  Hershal Coria, PharmD, BCPS Pager: [phone] 01/29/2014 11:18 AM

## 2014-01-29 NOTE — Progress Notes (Signed)
STAFF NOTE: I, Dr Ann Lions have personally reviewed patient's available data, including medical history, events of note, physical examination and test results as part of my evaluation. I have discussed with resident/NP and other care providers such as pharmacist, RN and RRT.  In addition,  I personally evaluated patient and elicited key findings of  Acute resp falure due to sepsis. REcent diarrhea but c diff negative. Full code for now but no LTAC/Trach. She is now in 2nd crucial week, if no medical extubaion possible next 3-7 days then comfort care.  Rest per NP/medical resident whose note is outlined above and that I agree with  The patient is critically ill with multiple organ systems failure and requires high complexity decision making for assessment and support, frequent evaluation and titration of therapies, application of advanced monitoring technologies and extensive interpretation of multiple databases.   Critical Care Time devoted to patient care services described in this note is  35  Minutes.  Dr. Brand Males, M.D., [facility].C.P Pulmonary and Critical Care Medicine Staff Physician [place] [facility] Pager: [phone], If no answer or between  15:00h - 7:00h: call [phone]  01/29/2014 11:44 AM

## 2014-01-29 NOTE — Progress Notes (Signed)
Weaned today with 5 PS for 4 hrs. Tolerated well but after 4 hours noted to have increased WOB as evidenced by sitting upright in bed and "purposely" taking deep breaths as if trying to get more air. Decreased anxiety noted today. Versed 2 mg used during weaning when agitation noted.

## 2014-01-30 ENCOUNTER — Inpatient Hospital Stay (HOSPITAL_COMMUNITY): Payer: Medicare Other

## 2014-01-30 LAB — GLUCOSE, CAPILLARY
GLUCOSE-CAPILLARY: 170 mg/dL — AB (ref 70–99)
GLUCOSE-CAPILLARY: 155 mg/dL — AB (ref 70–99)
Glucose-Capillary: 149 mg/dL — ABNORMAL HIGH (ref 70–99)
Glucose-Capillary: 150 mg/dL — ABNORMAL HIGH (ref 70–99)
Glucose-Capillary: 138 mg/dL — ABNORMAL HIGH (ref 70–99)
Glucose-Capillary: 151 mg/dL — ABNORMAL HIGH (ref 70–99)

## 2014-01-30 LAB — CBC
HCT: 29.7 % — ABNORMAL LOW (ref 36.0–46.0)
Hemoglobin: 8.8 g/dL — ABNORMAL LOW (ref 12.0–15.0)
MCH: 27.7 pg (ref 26.0–34.0)
MCHC: 29.6 g/dL — ABNORMAL LOW (ref 30.0–36.0)
MCV: 93.4 fL (ref 78.0–100.0)
PLATELETS: 317 10*3/uL (ref 150–400)
RBC: 3.18 MIL/uL — ABNORMAL LOW (ref 3.87–5.11)
RDW: 16.8 % — AB (ref 11.5–15.5)
WBC: 6.1 10*3/uL (ref 4.0–10.5)

## 2014-01-30 LAB — BASIC METABOLIC PANEL
BUN: 21 mg/dL (ref 6–23)
CALCIUM: 8.5 mg/dL (ref 8.4–10.5)
CO2: 32 mEq/L (ref 19–32)
Chloride: 104 mEq/L (ref 96–112)
Creatinine, Ser: 0.44 mg/dL — ABNORMAL LOW (ref 0.50–1.10)
Glucose, Bld: 163 mg/dL — ABNORMAL HIGH (ref 70–99)
POTASSIUM: 3.5 meq/L — AB (ref 3.7–5.3)
Sodium: 143 mEq/L (ref 137–147)

## 2014-01-30 LAB — PHOSPHORUS: Phosphorus: 4.1 mg/dL (ref 2.3–4.6)

## 2014-01-30 LAB — MAGNESIUM: MAGNESIUM: 2 mg/dL (ref 1.5–2.5)

## 2014-01-30 MED ORDER — METHYLPREDNISOLONE SODIUM SUCC 125 MG IJ SOLR
60.0000 mg | INTRAMUSCULAR | Status: DC
  Administered 2014-01-30 – 2014-02-02 (×4): 60 mg via INTRAVENOUS
  Filled 2014-01-30 (×5): qty 0.96

## 2014-01-30 MED ORDER — ALPRAZOLAM 1 MG PO TABS
1.0000 mg | ORAL_TABLET | Freq: Two times a day (BID) | ORAL | Status: DC
  Administered 2014-01-30 – 2014-02-02 (×6): 1 mg
  Filled 2014-01-30 (×6): qty 1

## 2014-01-30 MED ORDER — QUETIAPINE FUMARATE 25 MG PO TABS
225.0000 mg | ORAL_TABLET | Freq: Two times a day (BID) | ORAL | Status: DC
  Administered 2014-01-30 – 2014-02-05 (×12): 225 mg
  Filled 2014-01-30 (×13): qty 1

## 2014-01-30 MED ORDER — POTASSIUM CHLORIDE 20 MEQ/15ML (10%) PO LIQD
40.0000 meq | Freq: Once | ORAL | Status: AC
  Administered 2014-01-30: 40 meq
  Filled 2014-01-30: qty 30

## 2014-01-30 NOTE — Progress Notes (Signed)
Palliative Care Team at [place] Note   SUBJECTIVE: Resting on the vent requiring Diprivan for sedation. Call from daughter again inquiring about steroids.   Interval Events:   Intubated 2/3.  OBJECTIVE: Vital Signs: BP 111/56  Pulse 82  Temp(Src) 98.6 F (37 C) (Core (Comment))  Resp 16  Ht 5\' 2"  (1.575 m)  Wt 85.9 kg (189 lb 6 oz)  BMI 34.63 kg/m2  SpO2 96%   Intake and Output: 02/10 0701 - 02/11 0700 In: 2931.5 [P.O.:3; I.V.:1676; NG/GT:902.5; IV Piggyback:200] Out: 2020 [Urine:2020]  Physical Exam: General: Vital signs reviewed and noted. Frail, chronically ill appearing.  Head: Normocephalic, atraumatic.  Lungs:  Rhonchi  Heart: RRR. S1 and S2 normal without gallop,  or rubs. (+) murmur  Abdomen:  BS normoactive. Soft, Nondistended, non-tender.  No masses or organomegaly.  Extremities: trace edema.    Allergies  Allergen Reactions  . Benzodiazepines Other (See Comments)    Mood dysregulation  (on MAR)  . Morphine And Related Other (See Comments)    Pain dysregulation Mood disruption  . Olanzapine     Hallucinations and disorientation  . Sulfamethoxazole-Trimethoprim Hives    Medications: Scheduled Meds:  . acetaminophen (TYLENOL) oral liquid 160 mg/5 mL  650 mg Oral Once  . antiseptic oral rinse  15 mL Mouth Rinse QID  . budesonide  0.25 mg Nebulization BID  . chlorhexidine  15 mL Mouth Rinse BID  . enoxaparin (LOVENOX) injection  40 mg Subcutaneous Q24H  . feeding supplement (PRO-STAT SUGAR FREE 64)  30 mL Per Tube QID  . feeding supplement (VITAL HIGH PROTEIN)  1,000 mL Per Tube Q24H  . furosemide  40 mg Intravenous Daily  . gabapentin  600 mg Per Tube 3 times per day  . imipenem-cilastatin  500 mg Intravenous 3 times per day  . insulin aspart  0-20 Units Subcutaneous 6 times per day  . insulin glargine  20 Units Subcutaneous Daily  . ipratropium-albuterol  3 mL Nebulization Q6H  . metoprolol tartrate  50 mg Oral BID  . multivitamin   5 mL Per Tube Daily  . oxyCODONE  5 mg Oral BID  . potassium chloride  40 mEq Per Tube Daily  . QUEtiapine  225 mg Per Tube BID  . ranitidine  150 mg Per Tube BID  . traZODone  150 mg Per Tube QHS  . venlafaxine  75 mg Per Tube BID    Continuous Infusions: . propofol 65 mcg/kg/min (01/30/14 1428)    PRN Meds: sodium chloride, acetaminophen (TYLENOL) oral liquid 160 mg/5 mL, albuterol, fentaNYL, hydrALAZINE, metoprolol, midazolam   Stool Softner: yes   Palliative Performance Scale: 10 %    Labs: CBC    Component Value Date/Time   WBC 6.1 01/30/2014 0500   RBC 3.18* 01/30/2014 0500   HGB 8.8* 01/30/2014 0500   HCT 29.7* 01/30/2014 0500   PLT 317 01/30/2014 0500   MCV 93.4 01/30/2014 0500   MCH 27.7 01/30/2014 0500   MCHC 29.6* 01/30/2014 0500   RDW 16.8* 01/30/2014 0500   LYMPHSABS 1.0 01/21/2014 1139   MONOABS 0.8 01/21/2014 1139   EOSABS 0.1 01/21/2014 1139   BASOSABS 0.0 01/21/2014 1139    CMET     Component Value Date/Time   NA 143 01/30/2014 0500   K 3.5* 01/30/2014 0500   CL 104 01/30/2014 0500   CO2 32 01/30/2014 0500   GLUCOSE 163* 01/30/2014 0500   BUN 21 01/30/2014 0500   CREATININE 0.44* 01/30/2014 0500  CREATININE 0.73 03/24/2011 1614   CALCIUM 8.5 01/30/2014 0500   PROT 7.1 01/21/2014 1139   ALBUMIN 3.2* 01/21/2014 1139   AST 30 01/21/2014 1139   ALT 14 01/21/2014 1139   ALKPHOS 57 01/21/2014 1139   BILITOT 0.2* 01/21/2014 1139   GFRNONAA >90 01/30/2014 0500   GFRAA >90 01/30/2014 0500     Imaging: reviewed  ASSESSMENT/ PLAN: 73 yo woman with COPD and secondary PNA now intubated with respiratory failure and difficulty weaning.  Agitation and delirium persist. Noted that she was on scheduled benzodiazapine prior to admission Alprazolam 1 mg 4 times daily. Her daughter has called PMT phone requesting steroids be added to her medications as they have helped on several different occasions in the past.   Start Solu-Medrol 60 mg daily (discussed with Dr. Chase Caller)  Schedule  alprazolam per OG tube q12-daughter states this is most helpful drug for her anxiety. Note she was also on gabapentin for neuropathic pain.  Patient care discussed on phone with her daughter in detail. She agress to plan above and eventual extubation without re-intubation -we will complete a MOST form.  35 minutes. Greater than 50%  of this time was spent counseling and coordinating care related to the above assessment and plan.   Acquanetta Chain, DO  01/30/2014, 5:12 PM  Please contact Palliative Medicine Team phone at [phone] for questions and concerns.

## 2014-01-30 NOTE — Progress Notes (Signed)
Name: [name] MRN: [id number] DOB: 11/02/1941    ADMISSION DATE:  01/21/2014 CONSULTATION DATE:  01/21/14  REFERRING MD :  Cynda Acres PRIMARY SERVICE: PCCM  CHIEF COMPLAINT:  Fever, hypoxia  BRIEF PATIENT DESCRIPTION: 73 y/o resident of [facility] found by staff with fever and hypoxia. Taken to ED on NRB 100% with improvement. PCCM asked to see patient for respiratory needs and hypotension refractory to IVF. Recent admission to hospital 12/25/13 for ESBL E. Coli>>Urosepsis.  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 Right consolidation. 2 nodules in right apex (f/u CT 4-6 weeks recommended) ........................................................................................... 2/02 - Admitted to PCCM, LIJ placed, shock 2/03 - intubated for resp distress 2/07 - AF w RVR 2/09 - failed SBT 2/11 - tolerating SBT on propofol  LINES / TUBES: PIV 2/2>>> LIJ 2/2>>> Foley 2/2>>> 2/3 ETT>> 2/5 (self extub),2/5 ETT >>   CULTURES: Blood 2/2>>>ng Urine 2/2>>> (recent ESBL E. Coli isolated 12/31/13)>>e coli>> ESBL positive, sens to zosyn and imipenem resp virus 2/2>>negative  ANTIBIOTICS: Vancomycin 2/2>>> 2/5 Primaxin 2/2>>>   SUBJECTIVE:  RN reports decreased anxiety this am.  Weaning on PSV  VITAL SIGNS: Temp:  [97.5 F (36.4 C)-99.3 F (37.4 C)] 97.7 F (36.5 C) (02/11 0700) Pulse Rate:  [79-131] 88 (02/11 0500) Resp:  [13-23] 16 (02/11 0700) BP: (96-175)/(39-71) 148/55 mmHg (02/11 0700) SpO2:  [92 %-100 %] 99 % (02/11 0500) FiO2 (%):  [30 %] 30 % (02/11 0910) Weight:  [189 lb 6 oz (85.9 kg)] 189 lb 6 oz (85.9 kg) (02/11 0614)  HEMODYNAMICS:    VENTILATOR SETTINGS: Vent Mode:  [-] PRVC FiO2 (%):  [30 %] 30 % Set Rate:  [16 bmp] 16 bmp Vt Set:  [500 mL] 500 mL PEEP:  [5 cmH20] 5 cmH20 Pressure Support:  [8 cmH20] 8 cmH20 Plateau Pressure:  [15 cmH20-17 cmH20] 16 cmH20  INTAKE /  OUTPUT: Intake/Output     02/10 0701 - 02/11 0700 02/11 0701 - 02/12 0700   P.O. 3    I.V. (mL/kg) 1676 (19.5)    Other 150    NG/GT 902.5    IV Piggyback 200    Total Intake(mL/kg) 2931.5 (34.1)    Urine (mL/kg/hr) 2020 (1)    Total Output 2020     Net +911.5            PHYSICAL EXAMINATION: General appearance - anxious appearing, wide awake on propofol Neuro: awake, alert on propofol, MAE HEENT:  OETT, mm pink/moist, no jvd, L IJ c/d/i Chest:  distant breath sounds bilaterally CV: normal rate, regular rhythm, normal S1, S2 Abdomen: soft, nontender, nondistended, no masses or organomegaly Neuro: anxious, non focal, tremor + Musculoskeletal: no joint tenderness, deformity or swelling Extremities: peripheral pulses normal, no pedal edema, no clubbing or cyanosis Skin: normal coloration and turgor, no rashes, no suspicious skin lesions noted  PULMONARY  Recent Labs Lab 01/25/14 0433  PHART 7.470*  PCO2ART 42.6  PO2ART 67.5*  HCO3 30.6*  TCO2 28.0  O2SAT 94.3    CBC  Recent Labs Lab 01/28/14 0540 01/29/14 0350 01/30/14 0500  HGB 8.9* 8.5* 8.8*  HCT 29.2* 27.5* 29.7*  WBC 6.2 6.2 6.1  PLT 276 295 317   CARDIAC  No results found for this basename: TROPONINI,  in the last 168 hours  Recent Labs Lab 01/29/14 0350  PROBNP 380.5*   CHEMISTRY  Recent Labs Lab 01/25/14 0354 01/25/14 2000 01/26/14 0450 01/27/14  0530 01/28/14 0540 01/29/14 0350 01/30/14 0500  NA 140 141 141 143 143 147 143  K 2.8* 3.1* 3.3* 3.2* 3.1* 3.4* 3.5*  CL 97 100 101 103 104 107 104  CO2 $Re'31 31 30 27 28 31 'iEO$ 32  GLUCOSE 276* 169* 210* 293* 240* 174* 163*  BUN $Re'6 10 10 18 16 21 21  'nSL$ CREATININE 0.42* 0.43* 0.42* 0.53 0.47* 0.44* 0.44*  CALCIUM 7.8* 8.1* 8.4 8.5 8.5 8.5 8.5  MG 1.8  --  1.8 1.9  --  2.0 2.0  PHOS 1.9* 3.1 2.9  --   --  3.9 4.1   Estimated Creatinine Clearance: 65.6 ml/min (by C-G formula based on Cr of 0.44).  IMAGING x48h  Dg Chest Port 1 View  01/30/2014    CLINICAL DATA:  Septic shock.  EXAM: PORTABLE CHEST - 1 VIEW  COMPARISON:  01/29/2014 and 01/28/2014  FINDINGS: Endotracheal tube is approximately 2 cm above the carina, unchanged. Central venous catheter and OG tube appear in good position. Small left pleural effusion and slight atelectasis at the right base persist. There is slight pulmonary vascular prominence.  IMPRESSION: No significant change.   Electronically Signed   By: Rozetta Nunnery M.D.   On: 01/30/2014 07:36   Dg Chest Port 1 View  01/29/2014   CLINICAL DATA:  Hypoxia  EXAM: PORTABLE CHEST - 1 VIEW  COMPARISON:  January 28, 2014  FINDINGS: Endotracheal tube tip is 1.0 cm above the carina. Central catheter tip is in the superior vena cava. Nasogastric tube tip and side port are below the diaphragm. No pneumothorax.  There is patchy airspace consolidation in the left base, stable. There is atelectatic change in the right base, slightly less than on recent prior study. Heart size and pulmonary vascularity are normal.  IMPRESSION: Tube and catheter positions as described without pneumothorax. It may be prudent to consider retracting endotracheal tube approximately 3 cm. Persistent patchy airspace disease left base. Mild right base atelectasis.   Electronically Signed   By: Lowella Grip M.D.   On: 01/29/2014 07:01   ASSESSMENT / PLAN:  PULMONARY A: Acute Respiratory Failure COPD HCAP vs ARDS vs cardiogenic edema; suspect ALI  P:   -Goal SpO2 >92% -BD's > duonebs + pulmicort bid -Vent bundle -Diuresis as renal fxn / bp permit  CARDIOVASCULAR A:  Septic shock - resolved Hx HTN  P:  -Lopressor 50 bid & use 5 q6h prn -daily lasix 40 mg with scheduled K  RENAL A:   E coli ESBL UTI AG metabolic acidosis - resolved Hypophosphatemia/ hypokalemia  P:   -Monitor BMET -Replace lytes as indicated -scheduled K with lasix -additional 79mEq K on 2/11  GASTROINTESTINAL A:   Hx GERD SUP  P:   -Pepcid -TFs -Reglan x 4  doses  HEMATOLOGIC A:  Anemia, stable (asymptomatic), dvt prevention  P:  -Follow CBC -Sub  q heparin  INFECTIOUS A:  Septic shock resolved -  secondary to e coli UTI Leukocytosis - improving Hx E. Coli urosepsis, ESBL - undertreated from last admission.   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) Hyperglycemia  P:   -SSI - change to resistant scale 2/6 -Held metformin, will restart when taking PO's -Lantus    NEUROLOGIC A:   Acute encephalopathy and agitation Anxiety Bipolar P:   -propofol + prn fentanyl and versed -->wean as able -continue effexor, oxy IR, neurontin 600 q 8h  -increase trazodone  to 150 2/10  -increase seroquel to 225 bid -hopefully oral agents will allow a smoother wake-up and facilitate extubation   GLOBAL -met with palliative care, full code for now, family hopeful for extubation with possibility of no re-intubation.  They do not want LTAC or trach.     Noe Gens, NP-C  Pulmonary & Critical Care Pgr: [phone] or [phone]  01/30/2014 10:29 AM   STAFF NOTE: I, Dr Ann Lions have personally reviewed patient's available data, including medical history, events of note, physical examination and test results as part of my evaluation. I have discussed with resident/NP and other care providers such as pharmacist, RN and RRT.  In addition,  I personally evaluated patient and elicited key findings of acute resp failure with failure to wean despite increasing CNS and anti-psychotics. Might need to consider precedex based extubation if this approach does not work.  Rest per NP/medical resident whose note is outlined above and that I agree with  The patient is critically ill with multiple organ systems failure and requires high complexity decision making for assessment and support, frequent evaluation and titration of therapies, application of advanced monitoring technologies and extensive interpretation  of multiple databases.   Critical Care Time devoted to patient care services described in this note is  35  Minutes.  Dr. Brand Males, M.D., [facility].C.P Pulmonary and Critical Care Medicine Staff Physician [facility] Pager: [phone], If no answer or between  15:00h - 7:00h: call [phone]  01/30/2014 1:16 PM

## 2014-01-30 NOTE — Progress Notes (Signed)
Patient NU:UVOZD LATRESHA YAHR      DOB: 04-16-1941      GUY:[id number]   Palliative Medicine Team at [facility] Progress Note  Late entry for 2/10  Subjective:spoke with daughter after seeing Kashlynn earlier tonight. Brookelin did better with wean. daughter  Terrilee Files that her mom needs to be on steroids. Told her I would convey her thoughts to pulmonology. Daughter reiterates that they are leaning away from reintubation but would like to talk with critical care md before extubation to clarify their thoughts.for now they want to optimize medical condition before extubation. Still deciding on change of code status and reintubation.    Physical exam: 99 144/70 107 R13 95 % on vent  General awake looking around trying to raise head off bed but not severely agitated Chest decrease distant breath sounds cv regular s1,s2 abd soft not tender Ext warm Neuro slightly agitated    Assessment and plan:73 yr old admitted with respiratory failure self extubated reintubated now having difficulty weaning due to anxiety. Family has desired to treat the treatable and try to optimize before extubation. Deciding on status change to DNR with no reintubation but NOT tere yet. Daughter would appreciate an update from pulmonary MD   1. Full code.  Dr. Hilma Favors will continue conversations with the patients daughter  2.  Treat the treatable maximize chances of successful extubation. Please contract daughter [name] to discuss her concerns.   Total Time: 15 50% spent counseling daughter and discussing with primary service this am.s

## 2014-01-31 ENCOUNTER — Inpatient Hospital Stay (HOSPITAL_COMMUNITY): Payer: Medicare Other

## 2014-01-31 LAB — CBC
HEMATOCRIT: 29.6 % — AB (ref 36.0–46.0)
Hemoglobin: 8.7 g/dL — ABNORMAL LOW (ref 12.0–15.0)
MCH: 27.3 pg (ref 26.0–34.0)
MCHC: 29.4 g/dL — AB (ref 30.0–36.0)
MCV: 92.8 fL (ref 78.0–100.0)
Platelets: 358 10*3/uL (ref 150–400)
RBC: 3.19 MIL/uL — ABNORMAL LOW (ref 3.87–5.11)
RDW: 16.3 % — AB (ref 11.5–15.5)
WBC: 8.3 10*3/uL (ref 4.0–10.5)

## 2014-01-31 LAB — BASIC METABOLIC PANEL
BUN: 20 mg/dL (ref 6–23)
CO2: 30 mEq/L (ref 19–32)
CREATININE: 0.39 mg/dL — AB (ref 0.50–1.10)
Calcium: 8.9 mg/dL (ref 8.4–10.5)
Chloride: 103 mEq/L (ref 96–112)
GLUCOSE: 231 mg/dL — AB (ref 70–99)
Potassium: 4.2 mEq/L (ref 3.7–5.3)
Sodium: 141 mEq/L (ref 137–147)

## 2014-01-31 LAB — MAGNESIUM: MAGNESIUM: 2 mg/dL (ref 1.5–2.5)

## 2014-01-31 LAB — CLOSTRIDIUM DIFFICILE BY PCR: CDIFFPCR: NEGATIVE

## 2014-01-31 LAB — PHOSPHORUS: Phosphorus: 3.7 mg/dL (ref 2.3–4.6)

## 2014-01-31 LAB — GLUCOSE, CAPILLARY
GLUCOSE-CAPILLARY: 268 mg/dL — AB (ref 70–99)
GLUCOSE-CAPILLARY: 181 mg/dL — AB (ref 70–99)
Glucose-Capillary: 172 mg/dL — ABNORMAL HIGH (ref 70–99)
Glucose-Capillary: 186 mg/dL — ABNORMAL HIGH (ref 70–99)
Glucose-Capillary: 300 mg/dL — ABNORMAL HIGH (ref 70–99)

## 2014-01-31 MED ORDER — VITAL HIGH PROTEIN PO LIQD
1000.0000 mL | ORAL | Status: DC
  Administered 2014-01-31: 1000 mL
  Filled 2014-01-31 (×3): qty 1000

## 2014-01-31 NOTE — Progress Notes (Signed)
NUTRITION FOLLOW UP  Intervention:   - Lower rate of TF via OGT of Vital High Protein to 69ml/hr and continue with Prostat 59ml QID which will provide 880 calories, 102g protein, and 458ml free water. This rate of TF plus calories currently provided by Propofol will provide a total of 1294 calories and meet 81% of estimated calorie needs (25 kcals/kg ideal body weight) and 100% of estimated protein needs - Will continue to monitor   Nutrition Dx:   Inadequate oral intake related to inability to eat as evidenced by NPO - ongoing   Goal:   Enteral nutrition to provide 60-70% of estimated calorie needs (22-25 kcals/kg ideal body weight) and 100% of estimated protein needs, based on ASPEN guidelines for permissive underfeeding in critically ill obese individuals - met  Monitor:   Weights, labs, TF tolerance, vent status   Assessment:   Admitted with fever, shortness of breath, and altered mental status. From [facility] facility. Pt seen by inpatient RD at [facility] last month who noted pt was eating very poorly at her nursing home and weighed 203 pounds. Intubated yesterday for respiratory failure.   2/4 - Received consult for TF management. Due to pt's low potassium, magnesium, and phosphorus and very poor intake last admission, pt at risk of refeeding syndrome. Will advance TF very slowly.   2/5 - Remains intubated. Per conversation with RN, pt had 162ml TF residuals last night and 161ml TF residuals this morning. Magnesium WNL, however potassium and phosphorus remain low. Phosphorus down slightly compared to yesterday, likely due to refeeding. Getting 4 runs of KCl and IV sodium phosphate.   2/6 - Remains intubated. Started on TF of Vital High Protein 2/4 via OGT at 2ml/hr, did not advance due to refeeding risk. Potassium and phosphorus remain low. Discussed with pharmacist - pt getting aggressive replacement, IV and oral potassium replacement and IV phosphorus replacement - expect  potassium and phosphorus to improve. Pt's weight up 17 pounds since admission. Per conversation with RN, pt tolerating TF well with only 19ml residuals this morning.   2/9 - Reviewed events since last RD note. Pt with some agititation starting 2/7 which persisted 2/8. Remains intubated. Phosphorus and magnesium WNL, potassium low. Per RN, pt tolerating TF well with only 76ml residuals today. Plan is for possible extubation or further sedation today per RN. Pt in soft bilateral wrist restraints.   2/12 -  Had palliative care meeting yesterday, pt full code, family does not want LTAC or trach. Diarrhea reported on 2/10. Remains intubated. Per conversation with RN, pt without any TF residuals. Refeeding labs WNL.   Patient is currently intubated on ventilator support.  MV: 9 L/min Temp (24hrs), Avg:97.8 F (36.6 C), Min:91.8 F (33.2 C), Max:99 F (37.2 C)  Propofol: 15.7 ml/hr - provides 414 calories   TF: Vital High Protein to 42ml/hr via OGT with Prostat 68ml QID. This will provide 1120 calories, 123g protein, and 644ml free water and meet 60% of previously estimated calorie needs and 100% of estimated protein needs.     Potassium  Date/Time Value Ref Range Status  01/31/2014  5:40 AM 4.2  3.7 - 5.3 mEq/L Final  01/30/2014  5:00 AM 3.5* 3.7 - 5.3 mEq/L Final  01/29/2014  3:50 AM 3.4* 3.7 - 5.3 mEq/L Final    Phosphorus  Date/Time Value Ref Range Status  01/31/2014  5:40 AM 3.7  2.3 - 4.6 mg/dL Final  01/30/2014  5:00 AM 4.1  2.3 - 4.6  mg/dL Final  01/29/2014  3:50 AM 3.9  2.3 - 4.6 mg/dL Final    Magnesium  Date/Time Value Ref Range Status  01/31/2014  5:40 AM 2.0  1.5 - 2.5 mg/dL Final  01/30/2014  5:00 AM 2.0  1.5 - 2.5 mg/dL Final  01/29/2014  3:50 AM 2.0  1.5 - 2.5 mg/dL Final      Height: Ht Readings from Last 1 Encounters:  01/31/14 $RemoveB'5\' 2"'[id number]$  (1.575 m)    Weight Status:   Wt Readings from Last 1 Encounters:  01/31/14 193 lb 12.6 oz (87.9 kg)  Admit wt:        180 lb (81.6  kg) Net I/Os: -6.3L  Re-estimated needs:  Kcal: 1568 Protein: 100-120g Fluid: >1.5L/day  Skin: +1 generalized edema, +1 RUE, LUE edema, RLE, LLE edema, stage 1 sacral pressure ulcer  Diet Order: NPO   Intake/Output Summary (Last 24 hours) at 01/31/14 1416 Last data filed at 01/31/14 1300  Gross per 24 hour  Intake 2060.05 ml  Output   3525 ml  Net -1464.95 ml    Last BM: 2/12   Labs:   Recent Labs Lab 01/29/14 0350 01/30/14 0500 01/31/14 0540  NA 147 143 141  K 3.4* 3.5* 4.2  CL 107 104 103  CO2 31 32 30  BUN $Re'21 21 20  'gCa$ CREATININE 0.44* 0.44* 0.39*  CALCIUM 8.5 8.5 8.9  MG 2.0 2.0 2.0  PHOS 3.9 4.1 3.7  GLUCOSE 174* 163* 231*    CBG (last 3)   Recent Labs  01/31/14 0312 01/31/14 0739 01/31/14 1137  GLUCAP 268* 181* 172*    Scheduled Meds: . acetaminophen (TYLENOL) oral liquid 160 mg/5 mL  650 mg Oral Once  . ALPRAZolam  1 mg Per Tube BID  . antiseptic oral rinse  15 mL Mouth Rinse QID  . budesonide  0.25 mg Nebulization BID  . chlorhexidine  15 mL Mouth Rinse BID  . enoxaparin (LOVENOX) injection  40 mg Subcutaneous Q24H  . feeding supplement (PRO-STAT SUGAR FREE 64)  30 mL Per Tube QID  . feeding supplement (VITAL HIGH PROTEIN)  1,000 mL Per Tube Q24H  . furosemide  40 mg Intravenous Daily  . gabapentin  600 mg Per Tube 3 times per day  . imipenem-cilastatin  500 mg Intravenous 3 times per day  . insulin aspart  0-20 Units Subcutaneous 6 times per day  . insulin glargine  20 Units Subcutaneous Daily  . ipratropium-albuterol  3 mL Nebulization Q6H  . methylPREDNISolone (SOLU-MEDROL) injection  60 mg Intravenous Q24H  . metoprolol tartrate  50 mg Oral BID  . multivitamin  5 mL Per Tube Daily  . oxyCODONE  5 mg Oral BID  . potassium chloride  40 mEq Per Tube Daily  . QUEtiapine  225 mg Per Tube BID  . ranitidine  150 mg Per Tube BID  . traZODone  150 mg Per Tube QHS  . venlafaxine  75 mg Per Tube BID    Continuous Infusions: . propofol 35  mcg/kg/min (01/31/14 1300)    Mikey College MS, RD, [place] Pager [phone] After Hours Pager

## 2014-01-31 NOTE — Progress Notes (Addendum)
Inpatient Diabetes Program Recommendations  AACE/ADA: New Consensus Statement on Inpatient Glycemic Control (2013)  Target Ranges:  Prepandial:   less than 140 mg/dL      Peak postprandial:   less than 180 mg/dL (1-2 hours)      Critically ill patients:  140 - 180 mg/dL    While on Solumedrol, please consider an increase of the lantus to 30 units. Pt has required 2 doses of 11 units correction during the early am hours. Thank you, Rosita Kea, RN, CNS, Diabetes Coordinator [phone])

## 2014-01-31 NOTE — Progress Notes (Signed)
Name: [name] MRN: [id number] DOB: [birth year]   LOS 10 days   ADMISSION DATE:  01/21/2014 CONSULTATION DATE:  01/21/14  REFERRING MD :  Gabriel Cirri PRIMARY SERVICE: PCCM  CHIEF COMPLAINT:  Fever, hypoxia  BRIEF PATIENT DESCRIPTION: 73 y/o resident of [facility] found by staff with fever and hypoxia. Taken to ED on NRB 100% with improvement. PCCM asked to see patient for respiratory needs and hypotension refractory to IVF. Recent admission to hospital 12/25/13 for ESBL E. Coli>>Urosepsis.  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 Right consolidation. 2 nodules in right apex (f/u CT 4-6 weeks recommended) ........................................................................................... 2/02 - Admitted to PCCM, [place] placed, shock 2/03 - intubated for resp distress 2/07 - AF w RVR 2/09 - failed SBT 2/11 - tolerating SBT on propofol 2/12 - tolerates SBT 5/5 with good mechanics on propofol  LINES / TUBES: PIV 2/2>>> LIJ 2/2>>> Foley 2/2>>> 2/3 ETT>> 2/5 (self extub),2/5 ETT >>   CULTURES: Blood 2/2>>>ng Urine 2/2>>> (recent ESBL E. Coli isolated 12/31/13)>>e coli>> ESBL positive, sens to zosyn and imipenem resp virus 2/2>>negative  ANTIBIOTICS: Vancomycin 2/2>>> 2/5 Primaxin 2/2>>>   SUBJECTIVE:  RN reports decreased anxiety this am.  Weaning on PSV 5/5  VITAL SIGNS: Temp:  [91.8 F (33.2 C)-99 F (37.2 C)] 99 F (37.2 C) (02/12 1000) Pulse Rate:  [78-134] 109 (02/12 1000) Resp:  [14-24] 21 (02/12 1000) BP: (85-182)/(39-110) 120/55 mmHg (02/12 1000) SpO2:  [89 %-99 %] 98 % (02/12 1000) FiO2 (%):  [30 %-40 %] 40 % (02/12 0855) Weight:  [193 lb 12.6 oz (87.9 kg)] 193 lb 12.6 oz (87.9 kg) (02/12 0500)  HEMODYNAMICS:    VENTILATOR SETTINGS: Vent Mode:  [-] PRVC FiO2 (%):  [30 %-40 %] 40 % Set Rate:  [16 bmp] 16 bmp Vt Set:  [500 mL] 500 mL PEEP:  [5 cmH20] 5 cmH20 Plateau  Pressure:  [18 cmH20-21 cmH20] 18 cmH20  INTAKE / OUTPUT: Intake/Output     02/11 0701 - 02/12 0700 02/12 0701 - 02/13 0700   P.O.     I.V. (mL/kg) 1626.9 (18.5) 172.4 (2)   Other     NG/GT 450    IV Piggyback 300    Total Intake(mL/kg) 2376.9 (27) 172.4 (2)   Urine (mL/kg/hr) 3225 (1.5) 100 (0.3)   Total Output 3225 100   Net -848.2 +72.4        Stool Occurrence 2 x      PHYSICAL EXAMINATION: General:  wdwn female in NAD on vent, intermittent periods of agitation  Neuro: awake, alert on propofol, MAE HEENT:  OETT, mm pink/moist, no jvd, L IJ c/d/i Chest:  distant breath sounds bilaterally CV: normal rate, regular rhythm, normal S1, S2 Abdomen: soft, nontender, nondistended, no masses or organomegaly Neuro: anxious, non focal, tremor + Musculoskeletal: no joint tenderness, deformity or swelling Extremities: peripheral pulses normal, no pedal edema, no clubbing or cyanosis Skin: normal coloration and turgor, no rashes, no suspicious skin lesions noted  PULMONARY  Recent Labs Lab 01/25/14 0433  PHART 7.470*  PCO2ART 42.6  PO2ART 67.5*  HCO3 30.6*  TCO2 28.0  O2SAT 94.3   CBC  Recent Labs Lab 01/29/14 0350 01/30/14 0500 01/31/14 0540  HGB 8.5* 8.8* 8.7*  HCT 27.5* 29.7* 29.6*  WBC 6.2 6.1 8.3  PLT 295 317 358   CARDIAC  Recent Labs Lab 01/29/14 0350  PROBNP 380.5*   CHEMISTRY  Recent Labs Lab  01/25/14 2000 01/26/14 0450 01/27/14 0530 01/28/14 0540 01/29/14 0350 01/30/14 0500 01/31/14 0540  NA 141 141 143 143 147 143 141  K 3.1* 3.3* 3.2* 3.1* 3.4* 3.5* 4.2  CL 100 101 103 104 107 104 103  CO2 _0 32 30  GLUCOSE 169* 210* 293* 240* 174* 163* 231*  BUN _1 CREATININE 0.43* 0.42* 0.53 0.47* 0.44* 0.44* 0.39*  CALCIUM 8.1* 8.4 8.5 8.5 8.5 8.5 8.9  MG  --  1.8 1.9  --  2.0 2.0 2.0  PHOS 3.1 2.9  --   --  3.9 4.1 3.7   Estimated Creatinine Clearance: 66.4 ml/min (by C-G formula based on Cr of 0.39).  IMAGING  x48h  Dg Chest Port 1 View  01/31/2014   CLINICAL DATA:  Intubated patient.  EXAM: PORTABLE CHEST - 1 VIEW  COMPARISON:  Single view of the chest 01/30/2014.  FINDINGS: Support tubes and lines are unchanged. There small bilateral pleural effusions, larger on the left. Bibasilar atelectasis and pulmonary vascular congestion persist. Heart size is upper normal.  IMPRESSION: No marked change in small bilateral pleural effusions, basilar atelectasis and vascular congestion.   Electronically Signed   By: Inge Rise M.D.   On: 01/31/2014 07:35   Dg Chest Port 1 View  01/30/2014   CLINICAL DATA:  Septic shock.  EXAM: PORTABLE CHEST - 1 VIEW  COMPARISON:  01/29/2014 and 01/28/2014  FINDINGS: Endotracheal tube is approximately 2 cm above the carina, unchanged. Central venous catheter and OG tube appear in good position. Small left pleural effusion and slight atelectasis at the right base persist. There is slight pulmonary vascular prominence.  IMPRESSION: No significant change.   Electronically Signed   By: Rozetta Nunnery M.D.   On: 01/30/2014 07:36   ASSESSMENT / PLAN:  PULMONARY A: Acute Respiratory Failure COPD HCAP vs ARDS vs cardiogenic edema; suspect ALI  P:   -Goal SpO2 >92% -BD's > duonebs + pulmicort bid -Vent bundle -Diuresis as renal fxn / bp permit -she may be a candidate to extubate on light propofol given great mechanics while sedate and then stop gtts  CARDIOVASCULAR A:  Septic shock - resolved Hx HTN  P:  -Lopressor 50 bid & use 5 q6h prn -daily lasix 40 mg with scheduled K  RENAL A:   E coli ESBL UTI AG metabolic acidosis - resolved Hypophosphatemia/ hypokalemia  P:   -Monitor BMET -Replace lytes as indicated -scheduled K with lasix  GASTROINTESTINAL A:   Hx GERD SUP  P:   -Pepcid -TFs -Reglan x 4 doses (completed)  HEMATOLOGIC A:  Anemia, stable (asymptomatic DVT prevention  P:  -Follow CBC -Sub  q heparin  INFECTIOUS A:  Septic shock  resolved -  secondary to e coli UTI Leukocytosis - improving Hx E. Coli urosepsis, ESBL - undertreated from last admission.   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) Hyperglycemia  P:   -SSI - change to resistant scale 2/6 -Held metformin, will restart when taking PO's -Lantus    NEUROLOGIC A:   Acute encephalopathy and agitation Anxiety Bipolar P:   -propofol + prn fentanyl and versed -->wean as able -continue effexor, oxy IR, neurontin 600 q 8h  -increase trazodone to 150 2/10  -increase seroquel to 225 bid -hopefully oral agents will allow a smoother wake-up and facilitate extubation   GLOBAL -met with palliative care,  full code for now, family hopeful for extubation with possibility of no re-intubation.  They do not want LTAC or trach.     Noe Gens, NP-C [facility] Pgr: [phone] or [phone]  01/31/2014 11:07 AM   STAFF NOTE: I, Dr Ann Lions have personally reviewed patient's available data, including medical history, events of note, physical examination and test results as part of my evaluation. I have discussed with resident/NP and other care providers such as pharmacist, RN and RRT.  In addition,  I personally evaluated patient and elicited key findings of acute resp failure. Making no headway in terms of gettnig patient ready for extubation. On 01/31/14: is day 9 intubation. We are fast approaching mark for decisions on prolonged mech ventilation. I think will be approprate to palliative extubate next few days if not better. Will let palliative care lead discussion with family.  Rest per NP/medical resident whose note is outlined above and that I agree with  The patient is critically ill with multiple organ systems failure and requires high complexity decision making for assessment and support, frequent evaluation and titration of therapies, application of advanced monitoring technologies and  extensive interpretation of multiple databases.   Critical Care Time devoted to patient care services described in this note is  35  Minutes.  Dr. Brand Males, M.D., [facility].C.P Pulmonary and Critical Care Medicine Staff Physician [place] [facility] Pager: [phone], If no answer or between  15:00h - 7:00h: call [phone]  01/31/2014 12:13 PM

## 2014-02-01 ENCOUNTER — Inpatient Hospital Stay (HOSPITAL_COMMUNITY): Payer: Medicare Other

## 2014-02-01 LAB — GLUCOSE, CAPILLARY
GLUCOSE-CAPILLARY: 216 mg/dL — AB (ref 70–99)
GLUCOSE-CAPILLARY: 98 mg/dL (ref 70–99)
GLUCOSE-CAPILLARY: 232 mg/dL — AB (ref 70–99)
Glucose-Capillary: 275 mg/dL — ABNORMAL HIGH (ref 70–99)
Glucose-Capillary: 153 mg/dL — ABNORMAL HIGH (ref 70–99)
Glucose-Capillary: 224 mg/dL — ABNORMAL HIGH (ref 70–99)

## 2014-02-01 LAB — CBC
HEMATOCRIT: 28.3 % — AB (ref 36.0–46.0)
Hemoglobin: 8.6 g/dL — ABNORMAL LOW (ref 12.0–15.0)
MCH: 28 pg (ref 26.0–34.0)
MCHC: 30.4 g/dL (ref 30.0–36.0)
MCV: 92.2 fL (ref 78.0–100.0)
PLATELETS: 356 10*3/uL (ref 150–400)
RBC: 3.07 MIL/uL — ABNORMAL LOW (ref 3.87–5.11)
RDW: 16.5 % — AB (ref 11.5–15.5)
WBC: 7.5 10*3/uL (ref 4.0–10.5)

## 2014-02-01 LAB — BASIC METABOLIC PANEL
BUN: 21 mg/dL (ref 6–23)
CALCIUM: 8.9 mg/dL (ref 8.4–10.5)
CHLORIDE: 101 meq/L (ref 96–112)
CO2: 28 meq/L (ref 19–32)
CREATININE: 0.35 mg/dL — AB (ref 0.50–1.10)
GFR calc Af Amer: >90 mL/min (ref >90)
GFR calc non Af Amer: >90 mL/min (ref >90)
GLUCOSE: 198 mg/dL — AB (ref 70–99)
Potassium: 4 mEq/L (ref 3.7–5.3)
Sodium: 141 mEq/L (ref 137–147)

## 2014-02-01 LAB — PHOSPHORUS: Phosphorus: 3.8 mg/dL (ref 2.3–4.6)

## 2014-02-01 LAB — MAGNESIUM: Magnesium: 2.1 mg/dL (ref 1.5–2.5)

## 2014-02-01 IMAGING — CR DG CHEST 1V PORT
1 series · 1 of 1 positions shown · non-contrast
Comparison: 12/25/2013

CLINICAL DATA: Hypoxia

EXAM:
PORTABLE CHEST - 1 VIEW

[AP]
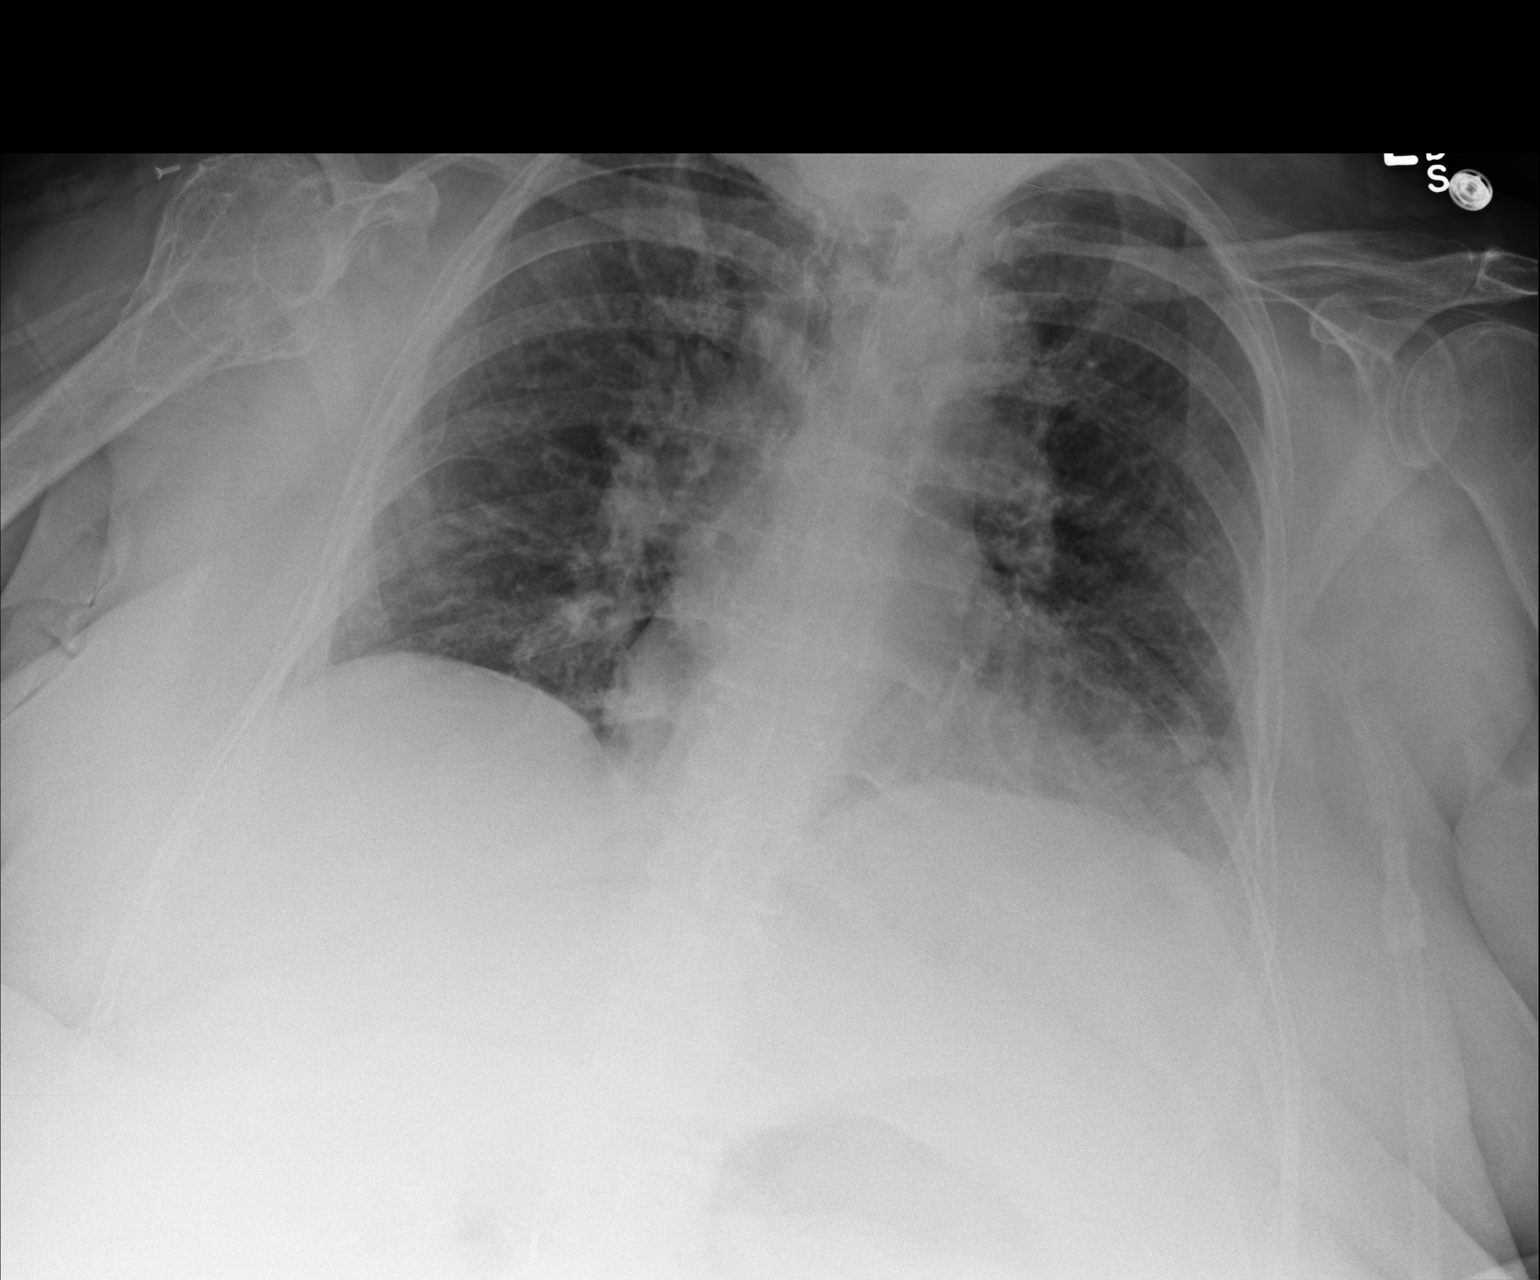

[1 of 1 positions shown; findings below may reference images not displayed]

FINDINGS: Cardiac shadow is stable. The lungs are well aerated bilaterally.
Diffuse interstitial changes are again identified. The previously
seen left-sided central venous line is no longer visualized. No
acute bony abnormality is seen. Chronic changes about the proximal
right humerus are seen.
IMPRESSION: Chronic changes without acute abnormality.

## 2014-02-01 MED ORDER — CHLORHEXIDINE GLUCONATE 0.12 % MT SOLN: 15.0000 mL | Freq: Two times a day (BID) | OROMUCOSAL | Status: DC

## 2014-02-01 MED ORDER — DEXMEDETOMIDINE HCL IN NACL 400 MCG/100ML IV SOLN
0.4000 ug/kg/h | INTRAVENOUS | Status: DC
  Administered 2014-02-01: 0.4 ug/kg/h via INTRAVENOUS
  Administered 2014-02-01: 1.2 ug/kg/h via INTRAVENOUS
  Administered 2014-02-01: 1 ug/kg/h via INTRAVENOUS
  Administered 2014-02-01: 1.2 ug/kg/h via INTRAVENOUS
  Administered 2014-02-01: 0.6 ug/kg/h via INTRAVENOUS
  Administered 2014-02-01 – 2014-02-02 (×2): 1.2 ug/kg/h via INTRAVENOUS
  Filled 2014-02-01: qty 50
  Filled 2014-02-01: qty 100
  Filled 2014-02-01: qty 50
  Filled 2014-02-01 (×2): qty 100
  Filled 2014-02-01 (×2): qty 50
  Filled 2014-02-01: qty 100
  Filled 2014-02-01: qty 50

## 2014-02-01 MED ORDER — RESOURCE THICKENUP CLEAR PO POWD
ORAL | Status: DC | PRN
  Filled 2014-02-01: qty 125

## 2014-02-01 MED ORDER — BIOTENE DRY MOUTH MT LIQD: 15.0000 mL | Freq: Two times a day (BID) | OROMUCOSAL | Status: DC

## 2014-02-01 MED ORDER — BIOTENE DRY MOUTH MT LIQD
15.0000 mL | Freq: Two times a day (BID) | OROMUCOSAL | Status: DC
  Administered 2014-02-01: 15 mL via OROMUCOSAL

## 2014-02-01 NOTE — Evaluation (Signed)
Clinical/Bedside Swallow Evaluation Patient Details  Name: [name] MRN: [id number] Date of Birth: 10-Aug-1941  Today's Date: 02/01/2014 Time: 4332-9518 SLP Time Calculation (min): 48 min  Past Medical History:  Past Medical History  Diagnosis Date  . Anxiety   . Arthritis   . COPD (chronic obstructive pulmonary disease)   . GERD (gastroesophageal reflux disease)   . Hyperlipidemia   . Osteoporosis   . DVT (deep venous thrombosis)   . PE (pulmonary thromboembolism)   . Hypertension      PT DENIES....ON NO  MEDS  . Diabetes mellitus   . Cancer 1969    cervical  . Pneumonia     h/o  . Bronchitis     h/o  . Blood transfusion   . Anemia   . Bipolar affect, depressed    Past Surgical History:  Past Surgical History  Procedure Laterality Date  . Vena cava filter placement    . 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 Eye surgery      CATARACTS  . Achilles tendon lengthening  11/18/11    and repair w/posterior tibial tendon lengthening; right  foot  . Cholecystectomy    . Abdominal hysterectomy  1969    "womb taken out for cervical cancer"  . Cataract extraction, bilateral  ~ 2008  . Tonsillectomy      "as a child"  . Achilles tendon surgery  11/18/2011    Procedure: ACHILLES TENDON REPAIR;  Surgeon: Wylene Simmer, MD;  Location: [facility];  Service: Orthopedics;  Laterality: Right;  Right Posterior Tibial Tendon Lenghtening and Tendon Achilles Lenghtening    HPI:  73 y/o resident of [facility] found by staff with fever and hypoxia. Taken to ED on NRB 100% with improvement. PCCM asked to see patient for respiratory needs and hypotension refractory to IVF. Recent admission to hospital 12/25/13 for ESBL E. Coli>>Urosepsis.   Assessment / Plan / Recommendation Clinical Impression  Patient just extubated this am at 11:45, but calling out "Help" and insisting on having food/liquid ASAP.  No overt s/s of aspiration noted at b/s, however, pt. had frequent belching (almost  after every swallow) and does have a h/o GERD.  Baseline speech is dysarthic and dyskenesia noted with speech.  Pt. had a MBS 06/28/12 and did have silent aspiration of thin liquids, but was allowed thin with no straw (small cup sips).  Will initiate Dysphagia 1 (pt's dentures are at home) with nectar thick liquids.  Recommend MBS prior to advancing to thin.  MD please order if you agree.    Aspiration Risk  Moderate    Diet Recommendation Dysphagia 1 (Puree);Nectar-thick liquid   Liquid Administration via: Cup;Straw Medication Administration: Whole meds with liquid (Crush if needed.) Supervision: Full supervision/cueing for compensatory strategies;Staff to assist with self feeding Compensations: Slow rate;Small sips/bites Postural Changes and/or Swallow Maneuvers: Seated upright 90 degrees;Upright 30-60 min after meal    Other  Recommendations Recommended Consults: MBS Oral Care Recommendations: Oral care Q4 per protocol;Staff/trained caregiver to provide oral care   Follow Up Recommendations  Skilled Nursing facility    Frequency and Duration min 3x week  2 weeks   Pertinent Vitals/Pain LS:  Rhonchi; afebrile; just extubated    SLP Swallow Goals   See care plan  Swallow Study Prior Functional Status       General HPI: 73 y/o resident of [facility] found by staff with fever and hypoxia. Taken to ED on NRB  100% with improvement. PCCM asked to see patient for respiratory needs and hypotension refractory to IVF. Recent admission to hospital 12/25/13 for ESBL E. Coli>>Urosepsis. Type of Study: Bedside swallow evaluation Previous Swallow Assessment: 06/28/12 Diet Prior to this Study: NPO Temperature Spikes Noted: No Respiratory Status: Nasal cannula History of Recent Intubation: Yes Length of Intubations (days): 10 days Date extubated: 02/01/14 Behavior/Cognition: Alert;Agitated;Impulsive;Uncooperative;Distractible;Requires cueing;Decreased sustained attention Oral Cavity -  Dentition: Edentulous Self-Feeding Abilities: Total assist Patient Positioning: Upright in bed Baseline Vocal Quality: Hoarse;Low vocal intensity Volitional Cough: Cognitively unable to elicit Volitional Swallow: Unable to elicit    Oral/Motor/Sensory Function Overall Oral Motor/Sensory Function: Impaired at baseline Labial ROM: Reduced left Labial Symmetry: Abnormal symmetry left Labial Strength: Reduced Lingual ROM: Reduced right;Reduced left Lingual Symmetry: Abnormal symmetry right;Abnormal symmetry left Lingual Strength: Reduced Facial ROM: Reduced left Facial Symmetry: Left droop Facial Strength: Reduced Mandible: Within Functional Limits   Ice Chips Ice chips: Within functional limits Presentation: Spoon Oral Phase Impairments: Reduced labial seal;Reduced lingual movement/coordination Pharyngeal Phase Impairments: Suspected delayed Swallow   Thin Liquid Thin Liquid: Within functional limits Presentation: Cup;Straw    Nectar Thick Nectar Thick Liquid: Not tested   Honey Thick Honey Thick Liquid: Not tested   Puree Puree: Within functional limits Presentation: Spoon   Solid   GO    Solid: Not tested       Quinn Axe T 02/01/2014,2:34 PM

## 2014-02-01 NOTE — Care Management Note (Signed)
    Page 1 of 2   02/01/2014     1:48:28 PM   CARE MANAGEMENT NOTE 02/01/2014  Patient:  [name], [name]   Account Number:  [id number]  Date Initiated:  01/22/2014  Documentation initiated by:  DAVIS,RHONDA  Subjective/Objective Assessment:   pt with urosepsis admitted with fever /required intubation due to hypoxia     Action/Plan:   snf   Anticipated DC Date:  02/03/2014   Anticipated DC Plan:  SKILLED NURSING FACILITY  In-house referral  Clinical Social Worker      DC Planning Services  CM consult      [facility] Choice  NA   Choice offered to / List presented to:  NA   DME arranged  NA      DME agency  NA     [place] arranged  NA      [place] agency  NA   Status of service:  In process, will continue to follow Medicare Important Message given?  NA - LOS <3 / Initial given by admissions (If response is "NO", the following Medicare IM given date fields will be blank) Date Medicare IM given:   Date Additional Medicare IM given:    Discharge Disposition:    Per UR Regulation:  Reviewed for med. necessity/level of care/duration of stay  If discussed at [place] of Stay Meetings, dates discussed:   01/29/2014  01/31/2014    Comments:  02/01/14 Chibuike Fleek RN,BSN NCM [phone] EXTUBATED.DNR.02,IV ABX,IV LASIX,IV SOLUMEDROL.PALLIATIVE FOLLOWING.FROM SNF. 01/31/2014 Sherrin Daisy BSN RN CCM Discussed in LOS rounds. Pr remains on vent. Plans are for SNF rehab.  [id number]/Rhonda Eldridge Dace, BSN, [place], [phone] Chart reviewed for update of needs and condition. patient self extubated self on [id number] could not tolerate bipap reintubated and iv sedation.  [id number]/NGEXBM Rosana Hoes, RN, BSN, CCM, [phone] Chart reviewed for update of needs and condition. Required intubation 2/3. + Ecoli this admit SS pending. Off pressors.. patient is from alf at [facility].  [place], RN, BSN, CCM [phone] Chart Reviewed for discharge and hospital  needs. Discharge needs at time of review:  None present will follow for needs. Review of patient progress due on [id number].

## 2014-02-01 NOTE — Progress Notes (Signed)
Name: [name] MRN: [id number] DOB: [birth year]   LOS 11 days   ADMISSION DATE:  01/21/2014 CONSULTATION DATE:  01/21/14  REFERRING MD :  Gabriel Cirri PRIMARY SERVICE: PCCM  CHIEF COMPLAINT:  Fever, hypoxia  BRIEF PATIENT DESCRIPTION: 73 y/o resident of [place] found by staff with fever and hypoxia. Taken to ED on NRB 100% with improvement. PCCM asked to see patient for respiratory needs and hypotension refractory to IVF. Recent admission to hospital 12/25/13 for ESBL E. Coli>>Urosepsis.  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 Right consolidation. 2 nodules in right apex (f/u CT 4-6 weeks recommended) ........................................................................................... 2/02 - Admitted to PCCM, LIJ placed, shock 2/03 - intubated for resp distress 2/07 - AF w RVR 2/09 - failed SBT 2/11 - tolerating SBT on propofol 2/12 - tolerates SBT 5/5 with good mechanics on propofol 2/13 - tolerating SBT 5/5, much improved anxiety  LINES / TUBES: PIV 2/2>>> LIJ 2/2>>> Foley 2/2>>> 2/3 ETT>> 2/5 (self extub), 2/5 ETT >>   CULTURES: Blood 2/2>>>ng Urine 2/2>>> (recent ESBL E. Coli isolated 12/31/13)>>e coli>> ESBL positive, sens to zosyn and imipenem resp virus 2/2>>negative  ANTIBIOTICS: Vancomycin 2/2>>> 2/5 Primaxin 2/2>>>   SUBJECTIVE:  Significant improvement  VITAL SIGNS: Temp:  [96.8 F (36 C)-98.6 F (37 C)] 98.1 F (36.7 C) (02/13 1000) Pulse Rate:  [78-144] 86 (02/13 1000) Resp:  [14-22] 18 (02/13 1000) BP: (86-153)/(33-97) 130/59 mmHg (02/13 1000) SpO2:  [92 %-100 %] 98 % (02/13 1000) FiO2 (%):  [40 %] 40 % (02/13 0818) Weight:  [193 lb 2 oz (87.6 kg)] 193 lb 2 oz (87.6 kg) (02/13 0500)  HEMODYNAMICS:    VENTILATOR SETTINGS: Vent Mode:  [-] CPAP FiO2 (%):  [40 %] 40 % Set Rate:  [16 bmp] 16 bmp Vt Set:  [500 mL] 500 mL PEEP:  [5 cmH20] 5 cmH20 Pressure  Support:  [5 cmH20-10 cmH20] 5 cmH20 Plateau Pressure:  [15 cmH20-24 cmH20] 18 cmH20  INTAKE / OUTPUT: Intake/Output     02/12 0701 - 02/13 0700 02/13 0701 - 02/14 0700   I.V. (mL/kg) 1322.1 (15.1) 137.1 (1.6)   NG/GT 560 290   IV Piggyback 300    Total Intake(mL/kg) 2182.1 (24.9) 427.1 (4.9)   Urine (mL/kg/hr) 2800 (1.3) 150 (0.5)   Total Output 2800 150   Net -617.9 +277.1          PHYSICAL EXAMINATION: General:  wdwn female in NAD on vent, intermittent periods of agitation  Neuro: awake, alert on propofol, MAE HEENT:  OETT, mm pink/moist, no jvd, L IJ c/d/i Chest:  distant breath sounds bilaterally CV: normal rate, regular rhythm, normal S1, S2 Abdomen: soft, nontender, nondistended, no masses or organomegaly Neuro: anxious, non focal, tremor + Musculoskeletal: no joint tenderness, deformity or swelling Extremities: peripheral pulses normal, no pedal edema, no clubbing or cyanosis Skin: warm/dry, multiple scars on forearms  CBC  Recent Labs Lab 01/30/14 0500 01/31/14 0540 02/01/14 0540  HGB 8.8* 8.7* 8.6*  HCT 29.7* 29.6* 28.3*  WBC 6.1 8.3 7.5  PLT 317 358 356   CARDIAC  Recent Labs Lab 01/29/14 0350  PROBNP 380.5*   CHEMISTRY  Recent Labs Lab 01/26/14 0450 01/27/14 0530 01/28/14 0540 01/29/14 0350 01/30/14 0500 01/31/14 0540 02/01/14 0540  NA 141 143 143 147 143 141 141  K 3.3* 3.2* 3.1* 3.4* 3.5* 4.2 4.0  CL 101 103 104 107 104 103 101  CO2 30  _0 32 30 28  GLUCOSE 210* 293* 240* 174* 163* 231* 198*  BUN _1 CREATININE 0.42* 0.53 0.47* 0.44* 0.44* 0.39* 0.35*  CALCIUM 8.4 8.5 8.5 8.5 8.5 8.9 8.9  MG 1.8 1.9  --  2.0 2.0 2.0 2.1  PHOS 2.9  --   --  3.9 4.1 3.7 3.8   Estimated Creatinine Clearance: 66.3 ml/min (by C-G formula based on Cr of 0.35).  IMAGING x48h  Dg Chest Port 1 View  02/01/2014   CLINICAL DATA:  Hypoxia  EXAM: PORTABLE CHEST - 1 VIEW  COMPARISON:  January 31, 2014  FINDINGS: Endotracheal tube tip is  2.8 cm above the carina. Nasogastric tube tip and side port are in the stomach. Central catheter tip is in the superior vena cava. No pneumothorax.  There is moderate interstitial edema bilaterally, stable. There are small effusions bilaterally. Heart is mildly enlarged. Pulmonary vascularity demonstrates mild pulmonary venous hypertension. There is no appreciable airspace consolidation or adenopathy. There is evidence of old trauma involving the proximal right humerus.  IMPRESSION: Tube and catheter positions appear essentially stable without apparent pneumothorax. Evidence of a degree of congestive heart failure, stable.   Electronically Signed   By: Lowella Grip M.D.   On: 02/01/2014 07:17   Dg Chest Port 1 View  01/31/2014   CLINICAL DATA:  Intubated patient.  EXAM: PORTABLE CHEST - 1 VIEW  COMPARISON:  Single view of the chest 01/30/2014.  FINDINGS: Support tubes and lines are unchanged. There small bilateral pleural effusions, larger on the left. Bibasilar atelectasis and pulmonary vascular congestion persist. Heart size is upper normal.  IMPRESSION: No marked change in small bilateral pleural effusions, basilar atelectasis and vascular congestion.   Electronically Signed   By: Inge Rise M.D.   On: 01/31/2014 07:35   ASSESSMENT / PLAN:  PULMONARY A: Acute Respiratory Failure COPD HCAP vs ARDS vs cardiogenic edema; suspect ALI  P:   -Goal SpO2 >92% -BD's > duonebs + pulmicort bid -Vent bundle -Diuresis as renal fxn / bp permit -she may be a candidate to extubate on light propofol given great mechanics while sedate and then stop gtts -daily SBT / WUA  CARDIOVASCULAR A:  Septic shock - resolved Hx HTN  P:  -Lopressor 50 bid & use 5 q6h prn -daily lasix 40 mg with scheduled K  RENAL A:   E coli ESBL UTI AG metabolic acidosis - resolved Hypophosphatemia/ hypokalemia  P:   -Monitor BMET -Replace lytes as indicated -scheduled K with lasix  GASTROINTESTINAL A:    Hx GERD SUP  P:   -Pepcid -TFs -Reglan x 4 doses (completed)  HEMATOLOGIC A:  Anemia, stable (asymptomatic DVT prevention  P:  -Follow CBC -Sub  q heparin  INFECTIOUS A:  Septic shock resolved -  secondary to e coli UTI Leukocytosis - improving Hx E. Coli urosepsis, ESBL - undertreated from last admission.   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) Hyperglycemia  P:   -SSI >resistant scale  -Held metformin, will restart when taking PO's -Lantus    NEUROLOGIC A:   Acute encephalopathy and agitation Anxiety Bipolar P:   -propofol + prn fentanyl and versed -->wean as able -continue effexor, oxy IR, neurontin 600 q 8h  -increase trazodone to 150 2/10  -increase seroquel to 225 bid -hopefully oral agents will allow a smoother wake-up and facilitate extubation  GLOBAL -met with palliative care, full code for now, family hopeful for extubation with possibility of no re-intubation.  They do not want LTAC or trach.     Noe Gens, NP-C [place] [facility] Pgr: [phone] or [phone]  02/01/2014 10:31 AM    STAFF NOTE: I, Dr Ann Lions have personally reviewed patient's available data, including medical history, events of note, physical examination and test results as part of my evaluation. I have discussed with resident/NP and other care providers such as pharmacist, RN and RRT.  In addition,  I personally evaluated patient and elicited key findings of acute resp failure. Improved. CAM-ICU negative for deliirium. Good cuff leak likely. Will inform daughter patient medically ready to extubate. I updated Dr Hilma Favors of palliative care. Plan is no reintubation after extubation. If agitated, do precedex  Rest per NP/medical resident whose note is outlined above and that I agree with  The patient is critically ill with multiple organ systems failure and requires high complexity decision making for assessment and  support, frequent evaluation and titration of therapies, application of advanced monitoring technologies and extensive interpretation of multiple databases.   Critical Care Time devoted to patient care services described in this note is  35  Minutes.  Dr. Brand Males, M.D., [facility].C.P Pulmonary and Critical Care Medicine Staff Physician [place] [facility] Pager: [phone], If no answer or between  15:00h - 7:00h: call [phone]  02/01/2014 11:27 AM

## 2014-02-01 NOTE — Progress Notes (Signed)
Spoke with her daughter re: extubation and code status. Confirmed DNR status. Supplemental O2 and full scope medical treatment.  Lane Hacker, DO Palliative Medicine

## 2014-02-01 NOTE — Progress Notes (Signed)
Extubated to 4L nasal cannula per md order after sbt of 5/5 cpap ps and positive cuff leak

## 2014-02-01 NOTE — Progress Notes (Signed)
ANTIBIOTIC CONSULT NOTE - FOLLOW UP  Pharmacy Consult for Primaxin Indication: Sepsis, ESBL E.coli UTI  Allergies  Allergen Reactions  . Morphine And Related Other (See Comments)    Pain dysregulation Mood disruption  . Olanzapine     Hallucinations and disorientation  . Sulfamethoxazole-Trimethoprim Hives    Patient Measurements: Height: 5\' 2"  (157.5 cm) Weight: 193 lb 2 oz (87.6 kg) IBW/kg (Calculated) : 50.1  Vital Signs: Temp: 98.1 F (36.7 C) (02/13 1000) Temp src: Core (Comment) (02/13 0800) BP: 130/59 mmHg (02/13 1000) Pulse Rate: 86 (02/13 1000) Intake/Output from previous day: 02/12 0701 - 02/13 0700 In: 2182.1 [I.V.:1322.1; NG/GT:560; IV Piggyback:300] Out: 2800 [Urine:2800]  Labs:  Recent Labs  01/30/14 0500 01/31/14 0540 02/01/14 0540  WBC 6.1 8.3 7.5  HGB 8.8* 8.7* 8.6*  PLT 317 358 356  CREATININE 0.44* 0.39* 0.35*   Estimated Creatinine Clearance: 66.3 ml/min (by C-G formula based on Cr of 0.35).  No results found for this basename: VANCOTROUGH, VANCOPEAK, VANCORANDOM, GENTTROUGH, GENTPEAK, GENTRANDOM, TOBRATROUGH, TOBRAPEAK, TOBRARND, AMIKACINPEAK, AMIKACINTROU, AMIKACIN,  in the last 72 hours   Microbiology: 1/6 urine >> pan-sensitive E. Coli 1/12 urine >> E. Coli +ESBL+ 2/2 urine >100k E.coli +ESBL+ 2/2 blood x 2 >> NGF 2/2 MRSA PCR (+) 2/3 RSV panel: neg  Anti-infectives: 2/2 >> Cefepime x 1  2/2 >> Vanc >> 2/5 2/2 >> Primaxin >>   Assessment: 37 yoF hospitalized at [facility] 1/7-1/13/15 with E.coli urosepsis with septic shock. Per notes from SNF on 1/23, pt was found with rash under breast and abdominal folds treated with nystatin cream. Pt presented to ED 2/2 with reported fever and SOB at SNF. Pt found febrile, tachycardic, hypotensive. Sepsis protocol started and empiric abx with Vanc, Cefepime. Of note, recent E.coli from urine culture was an ESBL producer and patient was treated with Cefepime inpatient, changed to Cipro at discharge  (not susceptible).   Day #12 Primaxin 500mg  IV q8h.  Tm24h: 98.6  WBC improved to WNL.  SCr wnl and stable, normalized CrCl ~58 ml/min (rounding SCr up to 1.0)  2/2 Urine culture: >100K E.coli +ESBL   Goal of Therapy:  Primaxin dose per weight/renal function  Plan:   Continue Primaxin 500mg  IV q8h  Continue to follow up renal function and planned length of therapy.  Ralene Bathe, PharmD, BCPS 02/01/2014, 11:01 AM  Pager: [phone]

## 2014-02-02 ENCOUNTER — Inpatient Hospital Stay (HOSPITAL_COMMUNITY): Payer: Medicare Other

## 2014-02-02 DIAGNOSIS — Z1612 Extended spectrum beta lactamase (ESBL) resistance: Secondary | ICD-10-CM

## 2014-02-02 DIAGNOSIS — A498 Other bacterial infections of unspecified site: Secondary | ICD-10-CM

## 2014-02-02 DIAGNOSIS — Z993 Dependence on wheelchair: Secondary | ICD-10-CM

## 2014-02-02 DIAGNOSIS — D649 Anemia, unspecified: Secondary | ICD-10-CM

## 2014-02-02 DIAGNOSIS — F332 Major depressive disorder, recurrent severe without psychotic features: Secondary | ICD-10-CM

## 2014-02-02 LAB — CBC
HCT: 31.4 % — ABNORMAL LOW (ref 36.0–46.0)
Hemoglobin: 9.9 g/dL — ABNORMAL LOW (ref 12.0–15.0)
MCH: 28.6 pg (ref 26.0–34.0)
MCHC: 31.5 g/dL (ref 30.0–36.0)
MCV: 90.8 fL (ref 78.0–100.0)
PLATELETS: 448 10*3/uL — AB (ref 150–400)
RBC: 3.46 MIL/uL — AB (ref 3.87–5.11)
RDW: 16 % — ABNORMAL HIGH (ref 11.5–15.5)
WBC: 9 10*3/uL (ref 4.0–10.5)

## 2014-02-02 LAB — BASIC METABOLIC PANEL
BUN: 15 mg/dL (ref 6–23)
CALCIUM: 9.2 mg/dL (ref 8.4–10.5)
CO2: 29 mEq/L (ref 19–32)
CREATININE: 0.37 mg/dL — AB (ref 0.50–1.10)
Chloride: 101 mEq/L (ref 96–112)
GFR calc Af Amer: >90 mL/min (ref >90)
GFR calc non Af Amer: >90 mL/min (ref >90)
Glucose, Bld: 172 mg/dL — ABNORMAL HIGH (ref 70–99)
Potassium: 4.3 mEq/L (ref 3.7–5.3)
SODIUM: 140 meq/L (ref 137–147)

## 2014-02-02 LAB — GLUCOSE, CAPILLARY
GLUCOSE-CAPILLARY: 203 mg/dL — AB (ref 70–99)
GLUCOSE-CAPILLARY: 159 mg/dL — AB (ref 70–99)
GLUCOSE-CAPILLARY: 151 mg/dL — AB (ref 70–99)
GLUCOSE-CAPILLARY: 180 mg/dL — AB (ref 70–99)
Glucose-Capillary: 245 mg/dL — ABNORMAL HIGH (ref 70–99)
Glucose-Capillary: 190 mg/dL — ABNORMAL HIGH (ref 70–99)
Glucose-Capillary: 129 mg/dL — ABNORMAL HIGH (ref 70–99)

## 2014-02-02 IMAGING — CT CT ANGIO CHEST
2 of 9 series · 18 of 46 positions shown · IV contrast (omnipaque)
Comparison: Chest radiograph December 30, 2013

CLINICAL DATA: Hypoxia

EXAM:
CT ANGIOGRAPHY CHEST WITH CONTRAST
TECHNIQUE: Multidetector CT imaging of the chest was performed using the
standard protocol during bolus administration of intravenous
contrast. Multiplanar CT image reconstructions including MIPs were
obtained to evaluate the vascular anatomy.
CONTRAST:  100mL OMNIPAQUE IOHEXOL 350 MG/ML SOLN

[Series 5: thins · axial · 0.61mm/px · z∈[+1221,+1478]mm · 15 of 291 slices shown]
[im 17/291  lung]
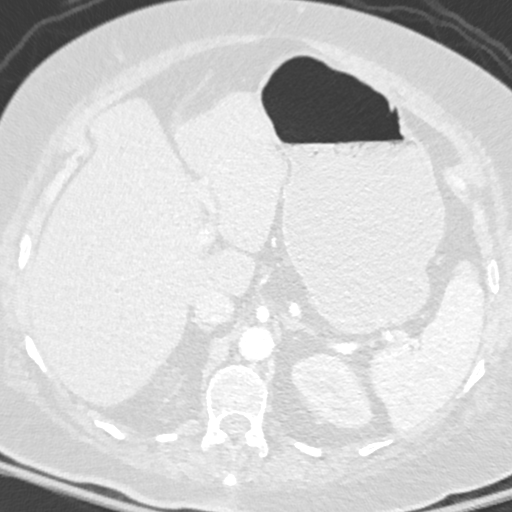
[im 33/291  soft-tissue]
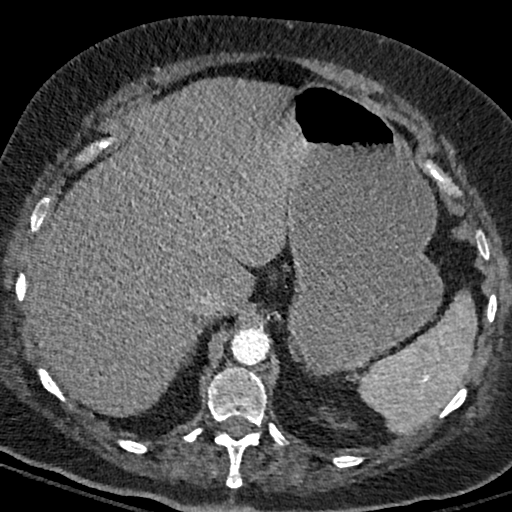
[im 49/291  lung]
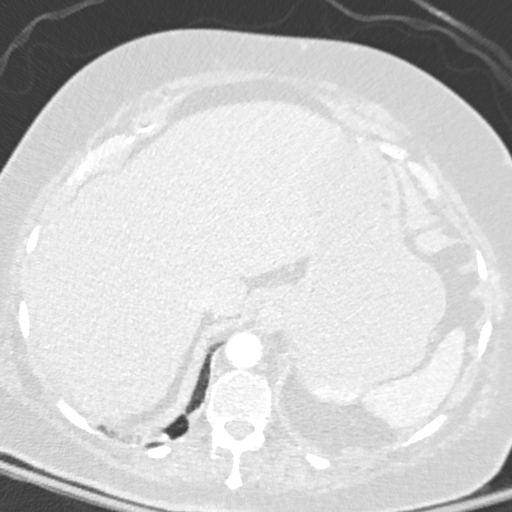
[im 65/291  soft-tissue]
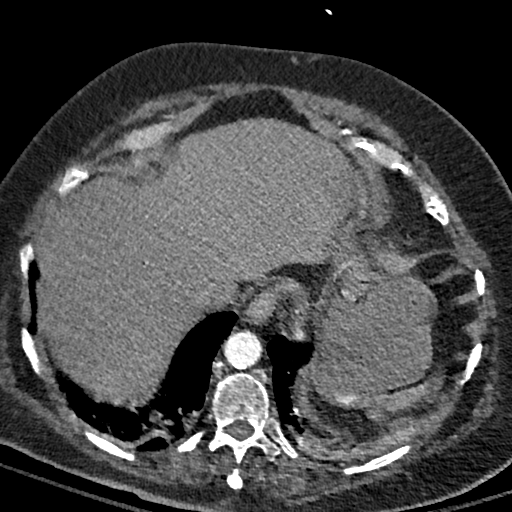
[im 97/291  lung]
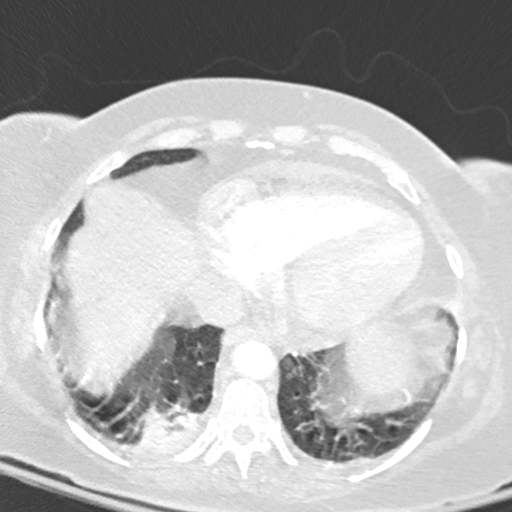
[im 113/291  soft-tissue]
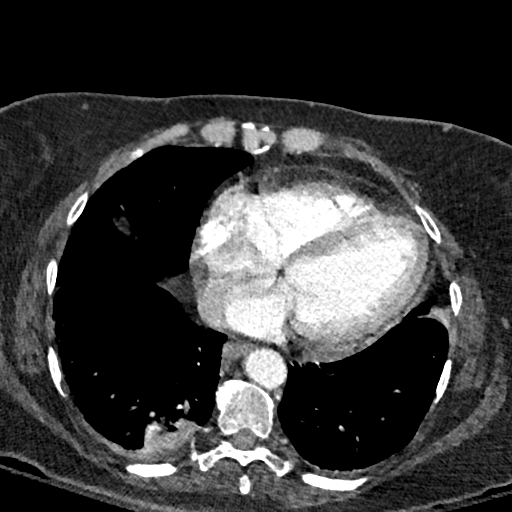
[im 129/291  lung]
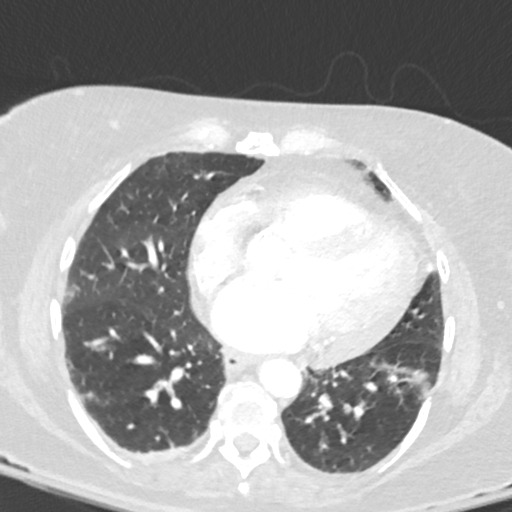
[im 146/291  soft-tissue]
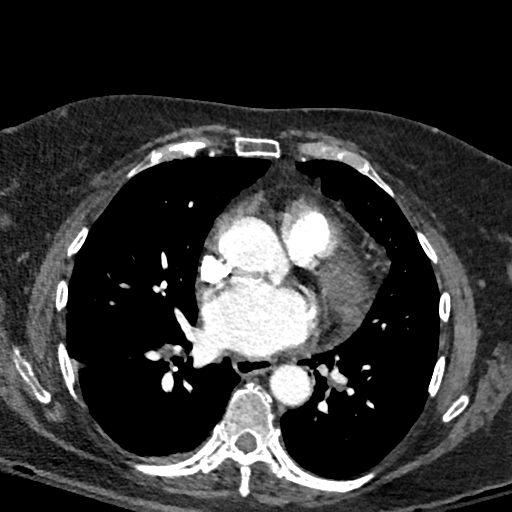
[im 162/291  lung]
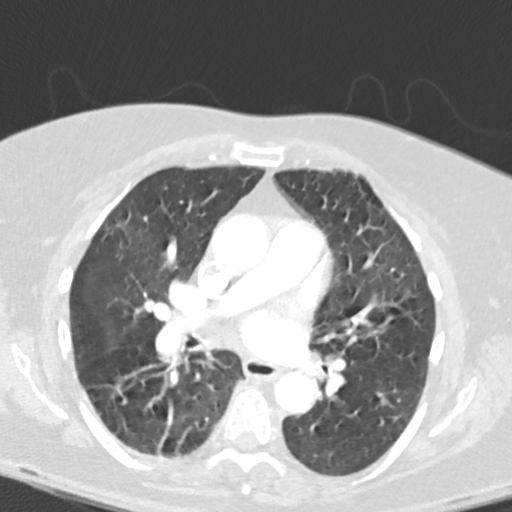
[im 178/291  soft-tissue]
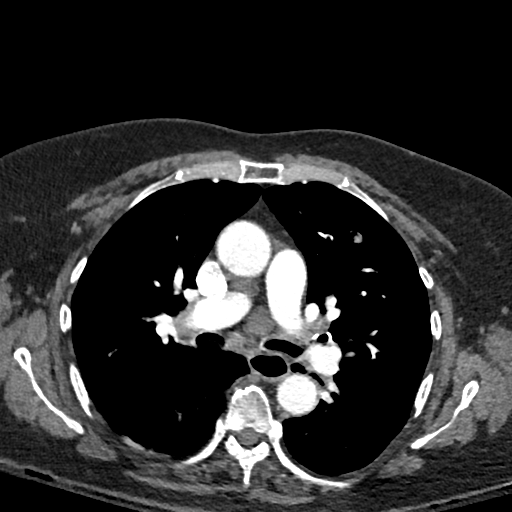
[im 194/291  lung]
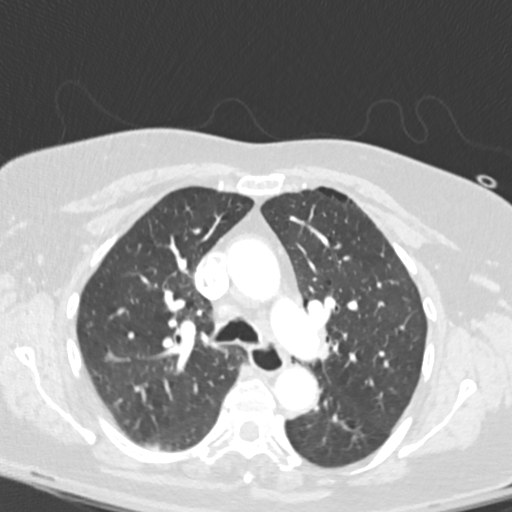
[im 226/291  soft-tissue]
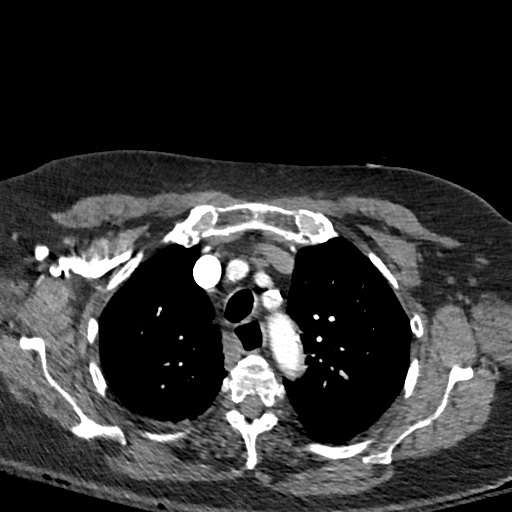
[im 242/291  lung]
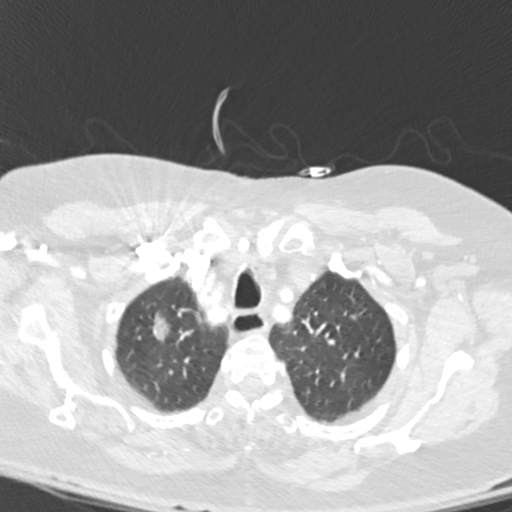
[im 258/291  soft-tissue]
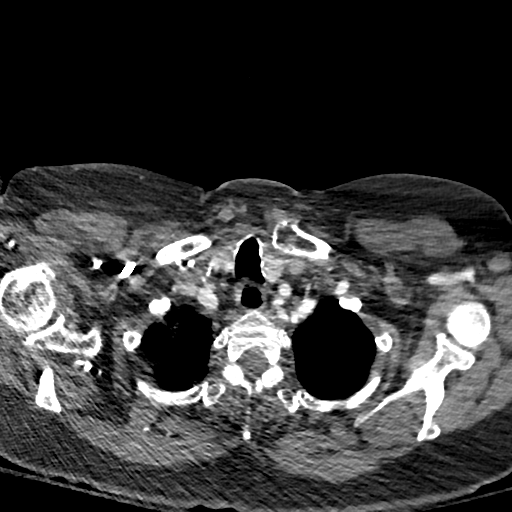
[im 274/291  lung]
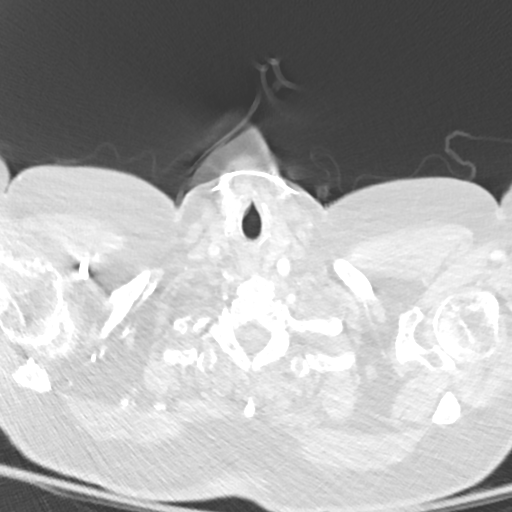

[Series 7: coronal mpr · coronal · 0.59mm/px · 3 of 151 slices shown]
[im 38/151  soft-tissue]
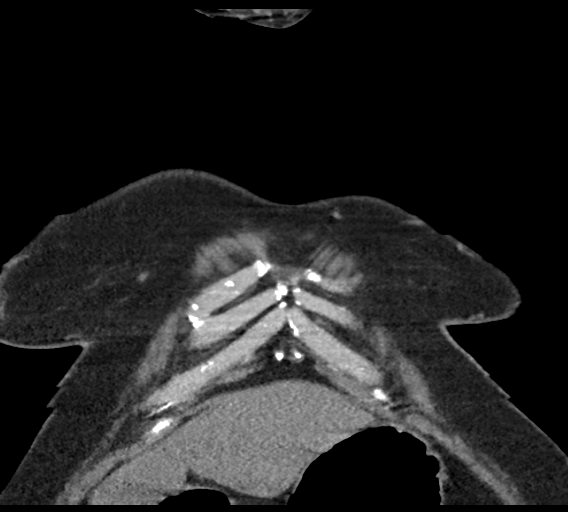
[im 76/151  soft-tissue]
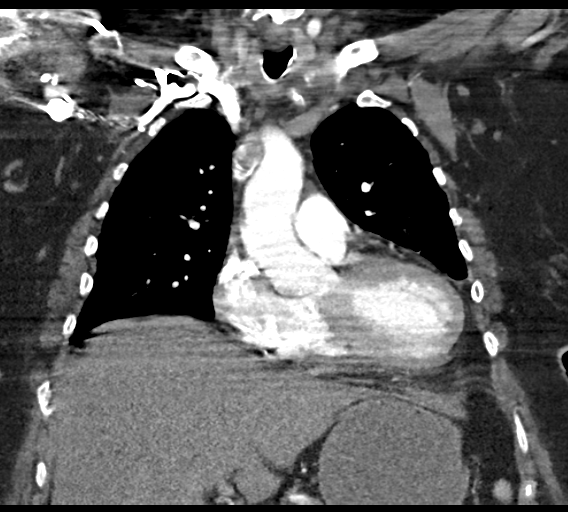
[im 113/151  soft-tissue]
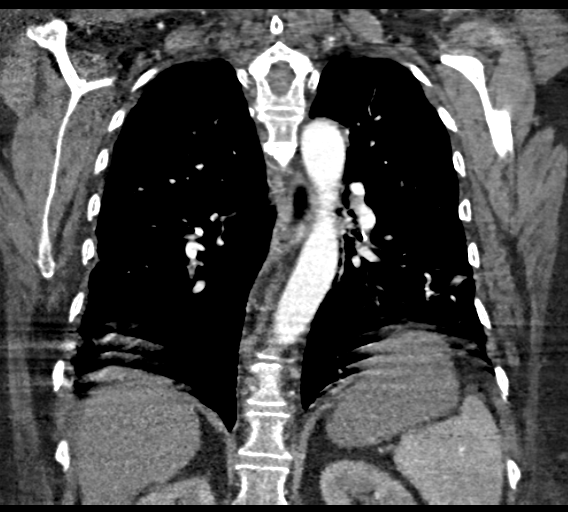

[18 of 46 positions shown; findings below may reference images not displayed]

FINDINGS: There is no demonstrable pulmonary embolus. There is no thoracic
aortic aneurysm or dissection.

There is underlying emphysema. There is focal opacity in the right
apex anteriorly measuring 2.7 x 1.6 cm. There is a nearby nodular
opacity in the right apex measuring 1.1 x 1.1 cm. On axial slice 37,
series 6, there is a 6 x 6 cm mass in the anterior segment of the
left upper lobe.

There is consolidation in the posterior segment of the right lower
lobe. There is left base atelectatic change.

There are multiple small mediastinal lymph nodes but no frank
adenopathy by size criteria.

There is a small pericardial effusion. There are scattered foci of
coronary artery calcification.

Visualized upper abdominal structures appear normal except for
atherosclerotic change in the aorta and fatty change in the liver.
There is degenerative change in the thoracic spine. There is slight
anterior wedging of a lower thoracic vertebral body. There are no
blastic or lytic bone lesions. Thyroid appears normal.

Review of the MIP images confirms the above findings.
IMPRESSION: No demonstrable pulmonary embolus.

Underlying emphysema. There is airspace consolidation in the right
base. There is left base atelectasis.

There are two somewhat nodular appearing opacities in the right apex
as well as a smaller nodular opacity in the anterior segment of the
left upper lobe. Neoplasm arising from the right apex cannot be
excluded on this study. In this circumstance, close clinical and
imaging surveillance are warranted. If there is strong clinical
concern for neoplasia, preceding with PET study at this time would
be reasonable. At a minimum, a followup chest CT and 4-6 weeks to
assess for potential interval clearing would be advised.

There is a small pericardial effusion.

There are multiple small mediastinal lymph nodes but no frank
adenopathy on this study.

## 2014-02-02 MED ORDER — FAMOTIDINE 20 MG PO TABS
20.0000 mg | ORAL_TABLET | Freq: Every day | ORAL | Status: DC
  Administered 2014-02-02 – 2014-02-04 (×3): 20 mg via ORAL
  Filled 2014-02-02 (×4): qty 1

## 2014-02-02 MED ORDER — ALPRAZOLAM 1 MG PO TABS
1.0000 mg | ORAL_TABLET | Freq: Three times a day (TID) | ORAL | Status: DC
  Administered 2014-02-02 – 2014-02-03 (×3): 1 mg
  Filled 2014-02-02 (×3): qty 1

## 2014-02-02 MED ORDER — ADULT MULTIVITAMIN W/MINERALS CH
1.0000 | ORAL_TABLET | Freq: Every day | ORAL | Status: DC
  Administered 2014-02-03 – 2014-02-05 (×3): 1 via ORAL
  Filled 2014-02-02 (×4): qty 1

## 2014-02-02 MED ORDER — OXYCODONE HCL ER 10 MG PO T12A
10.0000 mg | EXTENDED_RELEASE_TABLET | Freq: Two times a day (BID) | ORAL | Status: DC
  Administered 2014-02-02 – 2014-02-05 (×7): 10 mg via ORAL
  Filled 2014-02-02 (×7): qty 1

## 2014-02-02 MED ORDER — ONDANSETRON 8 MG/NS 50 ML IVPB
8.0000 mg | Freq: Once | INTRAVENOUS | Status: AC
  Administered 2014-02-02: 8 mg via INTRAVENOUS
  Filled 2014-02-02: qty 8

## 2014-02-02 MED ORDER — INSULIN GLARGINE 100 UNIT/ML ~~LOC~~ SOLN: 20.0000 [IU] | Freq: Every day | SUBCUTANEOUS | Status: DC

## 2014-02-02 MED ORDER — ONDANSETRON HCL 4 MG/2ML IJ SOLN
INTRAMUSCULAR | Status: AC
  Filled 2014-02-02: qty 2

## 2014-02-02 MED ORDER — ONDANSETRON HCL 4 MG/2ML IJ SOLN: 4.0000 mg | Freq: Four times a day (QID) | INTRAMUSCULAR | Status: DC | PRN

## 2014-02-02 MED ORDER — INSULIN ASPART 100 UNIT/ML ~~LOC~~ SOLN
0.0000 [IU] | Freq: Three times a day (TID) | SUBCUTANEOUS | Status: DC
  Administered 2014-02-02: 7 [IU] via SUBCUTANEOUS
  Administered 2014-02-03: 4 [IU] via SUBCUTANEOUS
  Administered 2014-02-03: 3 [IU] via SUBCUTANEOUS
  Administered 2014-02-03: 11 [IU] via SUBCUTANEOUS
  Administered 2014-02-04 (×2): 7 [IU] via SUBCUTANEOUS
  Administered 2014-02-04 – 2014-02-05 (×3): 4 [IU] via SUBCUTANEOUS

## 2014-02-02 MED ORDER — POTASSIUM CHLORIDE CRYS ER 20 MEQ PO TBCR: 40.0000 meq | EXTENDED_RELEASE_TABLET | Freq: Once | ORAL | Status: DC

## 2014-02-02 MED ORDER — SODIUM CHLORIDE 0.9 % IJ SOLN
10.0000 mL | INTRAMUSCULAR | Status: DC | PRN
  Administered 2014-02-02 – 2014-02-03 (×4): 10 mL
  Administered 2014-02-03: 20 mL
  Administered 2014-02-04: 10 mL

## 2014-02-02 NOTE — Progress Notes (Signed)
Name: BEENA CATANO MRN: [id number] DOB: Mar 11, 1941   LOS 12 days   ADMISSION DATE:  01/21/2014 CONSULTATION DATE:  01/21/14  REFERRING MD :  Gabriel Cirri PRIMARY SERVICE: PCCM  CHIEF COMPLAINT:  Fever, hypoxia  BRIEF PATIENT DESCRIPTION: 73 y/o resident of [place] found by staff with fever and hypoxia. Taken to ED on NRB 100% with improvement. PCCM asked to see patient for respiratory needs and hypotension refractory to IVF. Recent admission to hospital 12/25/13 for ESBL E. Coli>>Urosepsis.  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 Right consolidation. 2 nodules in right apex (f/u CT 4-6 weeks recommended) ........................................................................................... 2/02 - Admitted to PCCM, [place] placed, shock 2/03 - intubated for resp distress 2/07 - AF w RVR 2/09 - failed SBT 2/11 - tolerating SBT on propofol 2/12 - tolerates SBT 5/5 with good mechanics on propofol 2/13 - tolerating SBT 5/5, much improved anxiety  LINES / TUBES: PIV 2/2>>> LIJ 2/2>>> Foley 2/2>>> 2/3 ETT>> 2/5 (self extub), 2/5 ETT >> 2/13 (no reintubation per d/w Daughter and palliative care)  CULTURES: Blood 2/2>>>ng Urine 2/2>>> (recent ESBL E. Coli isolated 12/31/13)>>e coli>> ESBL positive, sens to zosyn and imipenem resp virus 2/2>>negative  ANTIBIOTICS: Vancomycin 2/2>>> 2/5 Primaxin 2/2>>>2/14   SUBJECTIVE:    02/02/14: Extubated yesterday. Full medical care but no reintubation. RN describes patient as very needy. Still on precedex but oriented  VITAL SIGNS: Temp:  [96.1 F (35.6 C)-98.6 F (37 C)] 98.1 F (36.7 C) (02/14 0900) Pulse Rate:  [73-102] 89 (02/14 0900) Resp:  [13-21] 19 (02/14 0900) BP: (87-156)/(40-99) 106/77 mmHg (02/14 0900) SpO2:  [94 %-100 %] 95 % (02/14 0900) Weight:  [86.7 kg (191 lb 2.2 oz)] 86.7 kg (191 lb 2.2 oz) (02/14 0500)  HEMODYNAMICS:    VENTILATOR  SETTINGS:    INTAKE / OUTPUT: Intake/Output     02/13 0701 - 02/14 0700 02/14 0701 - 02/15 0700   I.V. (mL/kg) 1279.9 (14.8)    NG/GT 290    IV Piggyback 300    Total Intake(mL/kg) 1869.9 (21.6)    Urine (mL/kg/hr) 2575 (1.2)    Total Output 2575     Net -705.1            PHYSICAL EXAMINATION: General:  Extuated. Looks better. Constantly asking for something Neuro: awake, alert orinted HEENT:  No ET tube. PERRL + Chest:  distant breath sounds bilaterally CV: normal rate, regular rhythm, normal S1, S2 Abdomen: soft, nontender, nondistended, no masses or organomegaly Neuro: anxious, non focal, tremor + Musculoskeletal: no joint tenderness, deformity or swelling Extremities: peripheral pulses normal, no pedal edema, no clubbing or cyanosis Skin: warm/dry, multiple scars on forearms  CBC  Recent Labs Lab 01/31/14 0540 02/01/14 0540 02/02/14 0530  HGB 8.7* 8.6* 9.9*  HCT 29.6* 28.3* 31.4*  WBC 8.3 7.5 9.0  PLT 358 356 448*   CARDIAC  Recent Labs Lab 01/29/14 0350  PROBNP 380.5*   CHEMISTRY  Recent Labs Lab 01/27/14 0530  01/29/14 0350 01/30/14 0500 01/31/14 0540 02/01/14 0540 02/02/14 0530  NA 143  < > 147 143 141 141 140  K 3.2*  < > 3.4* 3.5* 4.2 4.0 4.3  CL 103  < > 107 104 103 101 101  CO2 27  < > 31 32 _0 GLUCOSE 293*  < > 174* 163* 231* 198* 172*  BUN 18  < > _1 CREATININE  0.53  < > 0.44* 0.44* 0.39* 0.35* 0.37*  CALCIUM 8.5  < > 8.5 8.5 8.9 8.9 9.2  MG 1.9  --  2.0 2.0 2.0 2.1  --   PHOS  --   --  3.9 4.1 3.7 3.8  --   < > = values in this interval not displayed. Estimated Creatinine Clearance: 65.9 ml/min (by C-G formula based on Cr of 0.37).  IMAGING x48h  Dg Chest Port 1 View  02/02/2014   CLINICAL DATA:  Evaluate ET tube.  EXAM: PORTABLE CHEST - 1 VIEW  COMPARISON:  02/01/2014.  FINDINGS: Left IJ central venous catheter tip projects over the superior vena cava. Patient is rotated, limiting evaluation.  Stable cardiac  and mediastinal contours. Persistent small left pleural effusion underlying consolidative opacities. Unchanged pulmonary vascular redistribution.  No endotracheal tube is identified on this examination. Interval removal of enteric tube.  IMPRESSION: No endotracheal tube is identified, correlate for interval extubation.  Unchanged small left pleural effusion and underlying consolidative opacities.   Electronically Signed   By: Lovey Newcomer M.D.   On: 02/02/2014 07:56   Dg Chest Port 1 View  02/01/2014   CLINICAL DATA:  Hypoxia  EXAM: PORTABLE CHEST - 1 VIEW  COMPARISON:  January 31, 2014  FINDINGS: Endotracheal tube tip is 2.8 cm above the carina. Nasogastric tube tip and side port are in the stomach. Central catheter tip is in the superior vena cava. No pneumothorax.  There is moderate interstitial edema bilaterally, stable. There are small effusions bilaterally. Heart is mildly enlarged. Pulmonary vascularity demonstrates mild pulmonary venous hypertension. There is no appreciable airspace consolidation or adenopathy. There is evidence of old trauma involving the proximal right humerus.  IMPRESSION: Tube and catheter positions appear essentially stable without apparent pneumothorax. Evidence of a degree of congestive heart failure, stable.   Electronically Signed   By: Lowella Grip M.D.   On: 02/01/2014 07:17   ASSESSMENT / PLAN:  PULMONARY A: Acute Respiratory Failure COPD HCAP vs ARDS vs cardiogenic edema; suspect ALI  02/02/14: s/p extubation 24h ago. Resp status good  P:   -Goal SpO2 >92% -BD's > duonebs + pulmicort bid -Diuresis as renal fxn / bp permit   CARDIOVASCULAR A:  Septic shock - resolved Hx HTN  P:  -Lopressor 50 bid & use 5 q6h prn -daily lasix 40 mg with scheduled K  RENAL A:   AG metabolic acidosis - resolved   P:   -Monitor BMET -Replace lytes as indicated -scheduled K with lasix  GASTROINTESTINAL A:   Hx GERD SUP  P:   -Pepcid -D1 diet - MBS  per Speech pending as of 02/02/14   HEMATOLOGIC A:  Anemia, stable (asymptomatic DVT prevention  P:  - Follow CBC -Subq lovenox  INFECTIOUS A:  Septic shock resolved -  secondary to e coli UTI Leukocytosis - improving Hx E. Coli urosepsis, ESBL - undertreated from last admission.  2/14: doing well  P:   -dc imipenem  ENDOCRINE A:   DMII (01/07/14 A1C = 8.1) Hyperglycemia  P:   -SSI >resistant scale  -Held metformin, will restart when taking PO's or towards dc -Lantus    NEUROLOGIC A:   Acute encephalopathy and agitation Anxiety Bipolar  2/14: Very needed buut oriented on precedex and other psych meds P:   -dc precexed -continue effexor, oxy IR (change to XR), neurontin 600 q 8h  -increase trazodone to 150 2/10  -increase seroquel to 225 bid - start sitter at bedside  GLOBAL -met with palliative care, full code for now, family hopeful for extubation with possibility of no re-intubation.  They do not want LTAC or trach.  No reintubation as of 02/01/14  - 02/02/14: no family at bedside. Move to sDU andpossibly floor later today/tomorrow. TRH pick up 02/03/14    Dr. Brand Males, M.D., [facility].C.P Pulmonary and Critical Care Medicine Staff Physician [facility] Pager: [phone], If no answer or between  15:00h - 7:00h: call [phone]  02/02/2014 9:51 AM

## 2014-02-02 NOTE — Consult Note (Signed)
Reason for Consult:Depression and anxiety, medication management Referring Physician: Dr. Abelino Derrick is an 73 y.o. female.  HPI: Patient was seen and chart reviewed. Patient reported she has been suffering with multiple medical problems and also emotional problems. Patient reported that she has been receiving medication for depression and anxiety from primary care physician. Patient has no history of acute psychiatric hospitalization. Patient denies current symptoms of depression, anxiety and the suicidal or homicidal ideation. Patient has no evidence of psychosis. Patient staff and is reported that patient has been hollering from time to time. This is a 73 yo caucasian female resident of [place] found by staff with fever and hypoxia. Patient reported feeling physically sickness including nausea and vomiting and receiving appropriate medication. Patient is hoping to go back to [place] from residential center when she was medically cleared.   Mental Status Examination: Patient appeared as per his stated age, and fairly groomed, and maintaining good eye contact. Patient has depressed and anxious mood and her affect was appropriate. She has normal rate, rhythm, and volume of speech with mild dysarthria. Her thought process is linear and goal directed. Patient has denied suicidal, homicidal ideations, intentions or plans. Patient has no evidence of auditory or visual hallucinations, delusions, and paranoia. Patient has fair insight judgment and impulse control.  Past Medical History  Diagnosis Date  . Anxiety   . Arthritis   . COPD (chronic obstructive pulmonary disease)   . GERD (gastroesophageal reflux disease)   . Hyperlipidemia   . Osteoporosis   . DVT (deep venous thrombosis)   . PE (pulmonary thromboembolism)   . Hypertension      PT DENIES....ON NO  MEDS  . Diabetes mellitus   . Cancer 1969    cervical  . Pneumonia     h/o  . Bronchitis     h/o  . Blood transfusion   .  Anemia   . Bipolar affect, depressed     Past Surgical History  Procedure Laterality Date  . Vena cava filter placement    . 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 Eye surgery      CATARACTS  . Achilles tendon lengthening  11/18/11    and repair w/posterior tibial tendon lengthening; right  foot  . Cholecystectomy    . Abdominal hysterectomy  1969    "womb taken out for cervical cancer"  . Cataract extraction, bilateral  ~ 2008  . Tonsillectomy      "as a child"  . Achilles tendon surgery  11/18/2011    Procedure: ACHILLES TENDON REPAIR;  Surgeon: Wylene Simmer, MD;  Location: [place];  Service: Orthopedics;  Laterality: Right;  Right Posterior Tibial Tendon Lenghtening and Tendon Achilles Lenghtening     History reviewed. No pertinent family history.  Social History:  reports that she quit smoking about 27 years ago. Her smoking use included Cigarettes. She smoked 1.00 pack per day. She has never used smokeless tobacco. She reports that she does not drink alcohol or use illicit drugs.  Allergies:  Allergies  Allergen Reactions  . Morphine And Related Other (See Comments)    Pain dysregulation Mood disruption  . Olanzapine     Hallucinations and disorientation  . Sulfamethoxazole-Trimethoprim Hives    Medications: I have reviewed the patient's current medications.  Results for orders placed during the hospital encounter of 01/21/14 (from the past 48 hour(s))  GLUCOSE, CAPILLARY     Status: Abnormal  Collection Time    01/31/14  4:51 PM      Result Value Ref Range   Glucose-Capillary 186 (*) 70 - 99 mg/dL   Comment 1 Documented in Chart     Comment 2 Notify RN    GLUCOSE, CAPILLARY     Status: Abnormal   Collection Time    02/01/14 12:04 AM      Result Value Ref Range   Glucose-Capillary 275 (*) 70 - 99 mg/dL   Comment 1 Notify RN    GLUCOSE, CAPILLARY     Status: Abnormal   Collection Time    02/01/14  3:59 AM      Result Value Ref Range   Glucose-Capillary  216 (*) 70 - 99 mg/dL   Comment 1 Notify RN    MAGNESIUM     Status: None   Collection Time    02/01/14  5:40 AM      Result Value Ref Range   Magnesium 2.1  1.5 - 2.5 mg/dL  PHOSPHORUS     Status: None   Collection Time    02/01/14  5:40 AM      Result Value Ref Range   Phosphorus 3.8  2.3 - 4.6 mg/dL  BASIC METABOLIC PANEL     Status: Abnormal   Collection Time    02/01/14  5:40 AM      Result Value Ref Range   Sodium 141  137 - 147 mEq/L   Potassium 4.0  3.7 - 5.3 mEq/L   Chloride 101  96 - 112 mEq/L   CO2 28  19 - 32 mEq/L   Glucose, Bld 198 (*) 70 - 99 mg/dL   BUN 21  6 - 23 mg/dL   Creatinine, Ser 0.35 (*) 0.50 - 1.10 mg/dL   Calcium 8.9  8.4 - 10.5 mg/dL   GFR calc non Af Amer >90  >90 mL/min   GFR calc Af Amer >90  >90 mL/min   Comment: (NOTE)     The eGFR has been calculated using the CKD EPI equation.     This calculation has not been validated in all clinical situations.     eGFR's persistently <90 mL/min signify possible Chronic Kidney     Disease.  CBC     Status: Abnormal   Collection Time    02/01/14  5:40 AM      Result Value Ref Range   WBC 7.5  4.0 - 10.5 K/uL   RBC 3.07 (*) 3.87 - 5.11 MIL/uL   Hemoglobin 8.6 (*) 12.0 - 15.0 g/dL   HCT 28.3 (*) 36.0 - 46.0 %   MCV 92.2  78.0 - 100.0 fL   MCH 28.0  26.0 - 34.0 pg   MCHC 30.4  30.0 - 36.0 g/dL   RDW 16.5 (*) 11.5 - 15.5 %   Platelets 356  150 - 400 K/uL  GLUCOSE, CAPILLARY     Status: Abnormal   Collection Time    02/01/14  7:23 AM      Result Value Ref Range   Glucose-Capillary 153 (*) 70 - 99 mg/dL   Comment 1 Notify RN    GLUCOSE, CAPILLARY     Status: None   Collection Time    02/01/14 11:33 AM      Result Value Ref Range   Glucose-Capillary 98  70 - 99 mg/dL   Comment 1 Notify RN    GLUCOSE, CAPILLARY     Status: Abnormal   Collection Time  02/01/14  4:50 PM      Result Value Ref Range   Glucose-Capillary 224 (*) 70 - 99 mg/dL  GLUCOSE, CAPILLARY     Status: Abnormal   Collection  Time    02/01/14  7:52 PM      Result Value Ref Range   Glucose-Capillary 232 (*) 70 - 99 mg/dL   Comment 1 Documented in Chart     Comment 2 Notify RN    GLUCOSE, CAPILLARY     Status: Abnormal   Collection Time    02/01/14 11:21 PM      Result Value Ref Range   Glucose-Capillary 245 (*) 70 - 99 mg/dL   Comment 1 Documented in Chart    GLUCOSE, CAPILLARY     Status: Abnormal   Collection Time    02/02/14  4:07 AM      Result Value Ref Range   Glucose-Capillary 190 (*) 70 - 99 mg/dL   Comment 1 Documented in Chart    BASIC METABOLIC PANEL     Status: Abnormal   Collection Time    02/02/14  5:30 AM      Result Value Ref Range   Sodium 140  137 - 147 mEq/L   Potassium 4.3  3.7 - 5.3 mEq/L   Chloride 101  96 - 112 mEq/L   CO2 29  19 - 32 mEq/L   Glucose, Bld 172 (*) 70 - 99 mg/dL   BUN 15  6 - 23 mg/dL   Creatinine, Ser 0.37 (*) 0.50 - 1.10 mg/dL   Calcium 9.2  8.4 - 10.5 mg/dL   GFR calc non Af Amer >90  >90 mL/min   GFR calc Af Amer >90  >90 mL/min   Comment: (NOTE)     The eGFR has been calculated using the CKD EPI equation.     This calculation has not been validated in all clinical situations.     eGFR's persistently <90 mL/min signify possible Chronic Kidney     Disease.  CBC     Status: Abnormal   Collection Time    02/02/14  5:30 AM      Result Value Ref Range   WBC 9.0  4.0 - 10.5 K/uL   RBC 3.46 (*) 3.87 - 5.11 MIL/uL   Hemoglobin 9.9 (*) 12.0 - 15.0 g/dL   HCT 31.4 (*) 36.0 - 46.0 %   MCV 90.8  78.0 - 100.0 fL   MCH 28.6  26.0 - 34.0 pg   MCHC 31.5  30.0 - 36.0 g/dL   RDW 16.0 (*) 11.5 - 15.5 %   Platelets 448 (*) 150 - 400 K/uL  GLUCOSE, CAPILLARY     Status: Abnormal   Collection Time    02/02/14  7:41 AM      Result Value Ref Range   Glucose-Capillary 129 (*) 70 - 99 mg/dL   Comment 1 Documented in Chart     Comment 2 Notify RN      Dg Chest Port 1 View  02/02/2014   CLINICAL DATA:  Evaluate ET tube.  EXAM: PORTABLE CHEST - 1 VIEW  COMPARISON:   02/01/2014.  FINDINGS: Left IJ central venous catheter tip projects over the superior vena cava. Patient is rotated, limiting evaluation.  Stable cardiac and mediastinal contours. Persistent small left pleural effusion underlying consolidative opacities. Unchanged pulmonary vascular redistribution.  No endotracheal tube is identified on this examination. Interval removal of enteric tube.  IMPRESSION: No endotracheal tube is identified, correlate for interval  extubation.  Unchanged small left pleural effusion and underlying consolidative opacities.   Electronically Signed   By: Lovey Newcomer M.D.   On: 02/02/2014 07:56   Dg Chest Port 1 View  02/01/2014   CLINICAL DATA:  Hypoxia  EXAM: PORTABLE CHEST - 1 VIEW  COMPARISON:  January 31, 2014  FINDINGS: Endotracheal tube tip is 2.8 cm above the carina. Nasogastric tube tip and side port are in the stomach. Central catheter tip is in the superior vena cava. No pneumothorax.  There is moderate interstitial edema bilaterally, stable. There are small effusions bilaterally. Heart is mildly enlarged. Pulmonary vascularity demonstrates mild pulmonary venous hypertension. There is no appreciable airspace consolidation or adenopathy. There is evidence of old trauma involving the proximal right humerus.  IMPRESSION: Tube and catheter positions appear essentially stable without apparent pneumothorax. Evidence of a degree of congestive heart failure, stable.   Electronically Signed   By: Lowella Grip M.D.   On: 02/01/2014 07:17    Positive for anxiety, bad mood, depression and sleep disturbance Blood pressure 106/77, pulse 89, temperature 98.1 F (36.7 C), temperature source Core (Comment), resp. rate 19, height _0  (1.575 m), weight 86.7 kg (191 lb 2.2 oz), SpO2 95.00%.   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 02/02/2014, 12:22 PM

## 2014-02-02 NOTE — Progress Notes (Signed)
Speech Language Pathology Treatment: Dysphagia  Patient Details Name: PERRIE RAGIN MRN: [id number] DOB: 13-Feb-1941 Today's Date: 02/02/2014 Time: 0800-0820 SLP Time Calculation (min): 20 min  Assessment / Plan / Recommendation Clinical Impression  Pt tolerating nectar thick liquids well with no evidence of overt aspiration. Voice still very hoarse, unsure of baseline vocal quality. Given prolonged intubation and baseline dysphagia recommend continuation of current Dys 1/nectar thick diet. Will consider upgrade/testing as needed on Monday.    HPI HPI: 73 y/o resident of [facility] found by staff with fever and hypoxia. Taken to ED on NRB 100% with improvement. PCCM asked to see patient for respiratory needs and hypotension refractory to IVF. Recent admission to hospital 12/25/13 for ESBL E. Coli>>Urosepsis.   Pertinent Vitals NA  SLP Plan  Continue with current plan of care    Recommendations Diet recommendations: Dysphagia 1 (puree);Nectar-thick liquid Medication Administration: Whole meds with puree Supervision: Full supervision/cueing for compensatory strategies;Staff to assist with self feeding Compensations: Slow rate;Small sips/bites Postural Changes and/or Swallow Maneuvers: Seated upright 90 degrees;Upright 30-60 min after meal              Oral Care Recommendations: Oral care Q4 per protocol;Staff/trained caregiver to provide oral care Follow up Recommendations: Skilled Nursing facility Plan: Continue with current plan of care    GO    [facility], [place] CCC-SLP [phone]  Lynann Beaver 02/02/2014, 8:34 AM

## 2014-02-03 LAB — TSH: TSH: 3.176 u[IU]/mL (ref 0.350–4.500)

## 2014-02-03 LAB — GLUCOSE, CAPILLARY
GLUCOSE-CAPILLARY: 134 mg/dL — AB (ref 70–99)
Glucose-Capillary: 151 mg/dL — ABNORMAL HIGH (ref 70–99)
Glucose-Capillary: 285 mg/dL — ABNORMAL HIGH (ref 70–99)
Glucose-Capillary: 191 mg/dL — ABNORMAL HIGH (ref 70–99)

## 2014-02-03 LAB — CBC WITH DIFFERENTIAL/PLATELET
Basophils Absolute: 0.1 10*3/uL (ref 0.0–0.1)
Basophils Relative: 1 % (ref 0–1)
EOS ABS: 0 10*3/uL (ref 0.0–0.7)
Eosinophils Relative: 1 % (ref 0–5)
HCT: 31.5 % — ABNORMAL LOW (ref 36.0–46.0)
HEMOGLOBIN: 9.8 g/dL — AB (ref 12.0–15.0)
Lymphocytes Relative: 28 % (ref 12–46)
Lymphs Abs: 2.2 10*3/uL (ref 0.7–4.0)
MCH: 28.1 pg (ref 26.0–34.0)
MCHC: 31.1 g/dL (ref 30.0–36.0)
MCV: 90.3 fL (ref 78.0–100.0)
MONOS PCT: 8 % (ref 3–12)
Monocytes Absolute: 0.6 10*3/uL (ref 0.1–1.0)
NEUTROS PCT: 63 % (ref 43–77)
Neutro Abs: 5.1 10*3/uL (ref 1.7–7.7)
Platelets: 466 10*3/uL — ABNORMAL HIGH (ref 150–400)
RBC: 3.49 MIL/uL — ABNORMAL LOW (ref 3.87–5.11)
RDW: 16 % — ABNORMAL HIGH (ref 11.5–15.5)
WBC: 8.1 10*3/uL (ref 4.0–10.5)

## 2014-02-03 LAB — BASIC METABOLIC PANEL
BUN: 14 mg/dL (ref 6–23)
CALCIUM: 9.1 mg/dL (ref 8.4–10.5)
CHLORIDE: 96 meq/L (ref 96–112)
CO2: 32 meq/L (ref 19–32)
CREATININE: 0.39 mg/dL — AB (ref 0.50–1.10)
GFR calc Af Amer: >90 mL/min (ref >90)
GFR calc non Af Amer: >90 mL/min (ref >90)
Glucose, Bld: 224 mg/dL — ABNORMAL HIGH (ref 70–99)
Potassium: 4.1 mEq/L (ref 3.7–5.3)
Sodium: 138 mEq/L (ref 137–147)

## 2014-02-03 LAB — PHOSPHORUS: PHOSPHORUS: 4.3 mg/dL (ref 2.3–4.6)

## 2014-02-03 LAB — MAGNESIUM: Magnesium: 2 mg/dL (ref 1.5–2.5)

## 2014-02-03 LAB — PRO B NATRIURETIC PEPTIDE: Pro B Natriuretic peptide (BNP): 975.6 pg/mL — ABNORMAL HIGH (ref 0–125)

## 2014-02-03 MED ORDER — GABAPENTIN 300 MG PO CAPS
600.0000 mg | ORAL_CAPSULE | Freq: Three times a day (TID) | ORAL | Status: DC
  Administered 2014-02-03 – 2014-02-05 (×6): 600 mg via ORAL
  Filled 2014-02-03 (×8): qty 2

## 2014-02-03 MED ORDER — ALPRAZOLAM 1 MG PO TABS
1.0000 mg | ORAL_TABLET | ORAL | Status: DC
  Administered 2014-02-03 – 2014-02-05 (×8): 1 mg via ORAL
  Filled 2014-02-03 (×8): qty 1

## 2014-02-03 MED ORDER — ALUM & MAG HYDROXIDE-SIMETH 200-200-20 MG/5ML PO SUSP
15.0000 mL | ORAL | Status: DC | PRN
  Administered 2014-02-03 – 2014-02-05 (×2): 15 mL via ORAL
  Filled 2014-02-03 (×2): qty 30

## 2014-02-03 MED ORDER — PREDNISONE 20 MG PO TABS
20.0000 mg | ORAL_TABLET | Freq: Every day | ORAL | Status: DC
  Administered 2014-02-03: 20 mg via ORAL
  Filled 2014-02-03 (×3): qty 1

## 2014-02-03 MED ORDER — POLYETHYLENE GLYCOL 3350 17 G PO PACK
17.0000 g | PACK | Freq: Once | ORAL | Status: AC
  Administered 2014-02-03: 17 g via ORAL
  Filled 2014-02-03 (×2): qty 1

## 2014-02-03 NOTE — Progress Notes (Signed)
Clinical Social Work Department BRIEF PSYCHOSOCIAL ASSESSMENT 02/03/2014  Patient:  [name], [name]     Account Number:  [id number]     Admit date:  01/21/2014  Clinical Social Worker:  Levie Heritage  Date/Time:  02/03/2014 10:04 AM  Referred by:  Physician  Date Referred:  02/03/2014 Referred for  SNF Placement   Other Referral:   Interview type:  Patient Other interview type:    PSYCHOSOCIAL DATA Living Status:  FACILITY Admitted from facility:  [place] Level of care:  [facility] Primary support name:  [name] Primary support relationship to patient:  CHILD, ADULT Degree of support available:   strong    CURRENT CONCERNS Current Concerns  Post-Acute Placement   Other Concerns:    SOCIAL WORK ASSESSMENT / PLAN Met with Pt to discuss d/c plans.    Pt stated that she has been living at [facility] for 2 years and that she's unsure that she wants to return there. When CSW asked Pt about [facility] and why she may not want to go back, Pt stated, "I just don't like it.  But I do have a room there, so I guess I could go back."    Pt may be interested in other facilities and agreed to a county-wide SNF search.  She stated that her daughter will assist her in making her d/c decisions.    CSW provided Pt with a [place] SNF list.    CSW thanked Pt for her time.   Assessment/plan status:  Psychosocial Support/Ongoing Assessment of Needs Other assessment/ plan:   Information/referral to community resources:   SNF list    PATIENT'S/FAMILY'S RESPONSE TO PLAN OF CARE: Although Pt was extremely anxious, she was cooperative and pleasant.    Pt did admit that she is unable to go home, as she needs extensive care.  She realizes that she will need SNF, again, upon d/c but just isn't sure that she wants to go back to [facility].  She is interested to see what other facilities may be available to her.    Pt thanked CSW for time and  assistance.   Bernita Raisin, [place] Work [phone]

## 2014-02-03 NOTE — Evaluation (Signed)
Physical Therapy Evaluation Patient Details Name: [name] MRN: [id number] DOB: [birth year] Today's Date: 02/03/2014 Time: 3810-1751 PT Time Calculation (min): 38 min  PT Assessment / Plan / Recommendation History of Present Illness  73 yo caucasian female resident of [facility] found by staff with fever and hypoxia. Taken to ED on NRB 100% with improvement. PCCM asked to see patient for respiratory needs and hypotension refractory to IVF. Recent admission to hospital 12/25/13 for ESBL E. Coli>>Urosepsis VDRF from 2/3-2/13.  Clinical Impression  Pt tolerated squat pivot to BSC, stood x 2 at [facility]. Pt will benefit from PT to address problems listed. Pt resides in SNF.    PT Assessment  Patient needs continued PT services    Follow Up Recommendations  SNF    Does the patient have the potential to tolerate intense rehabilitation      Barriers to Discharge        Equipment Recommendations  None recommended by PT    Recommendations for Other Services     Frequency Min 2X/week    Precautions / Restrictions Precautions Precautions: Fall Precaution Comments: contact. Required Braces or Orthoses: Other Brace/Splint Other Brace/Splint: R double upright   Pertinent Vitals/Pain Sats 100% on 23 l, HR 99      Mobility  Bed Mobility Bed Mobility: Supine to Sit Supine to sit: Max assist;+2 for physical assistance;+2 for safety/equipment General bed mobility comments: pt initiated and assisted with moving legs to edge of the bed., required assist to get trunk into upright and use of the bed pad to slide to the edge. Transfers Overall transfer level: Needs assistance Equipment used:  (stedy) Transfers: Squat Pivot Transfers Sit to Stand: +2 physical assistance;+2 safety/equipment;Max assist Squat pivot transfers: +2 safety/equipment;+2 physical assistance;Max assist General transfer comment: Pt did bear weight, multimodal cues for reaching to [facility] and taking very small steps to  turn, eventually had to manually turn pt to get in front of the [facility]. Pt then pulled up with STEDY cross bar  to be cleaned up after BM. Stood a second time to have Recliner brought up to sit down.    Exercises     PT Diagnosis: Difficulty walking;Generalized weakness  PT Problem List: Decreased balance;Decreased activity tolerance;Decreased strength;Decreased mobility;Decreased safety awareness;Decreased knowledge of use of DME PT Treatment Interventions: DME instruction;Therapeutic exercise;Gait training;Balance training;Neuromuscular re-education;Modalities;Functional mobility training;Therapeutic activities;Patient/family education     PT Goals(Current goals can be found in the care plan section) Acute Rehab PT Goals Patient Stated Goal: I want to walk down the hall. PT Goal Formulation: With patient Time For Goal Achievement: 02/17/14 Potential to Achieve Goals: Fair  Visit Information  Last PT Received On: 02/03/14 Assistance Needed: +2 History of Present Illness: 73 yo caucasian female resident of [place] found by staff with fever and hypoxia. Taken to ED on NRB 100% with improvement. PCCM asked to see patient for respiratory needs and hypotension refractory to IVF. Recent admission to hospital 12/25/13 for ESBL E. Coli>>Urosepsis VDRF from 2/3-2/13.       Prior Functioning  Home Living Family/patient expects to be discharged to:: Skilled nursing facility Prior Function Level of Independence: Needs assistance Gait / Transfers Assistance Needed: asssited to WC, rolls WC  modified  indep.    Cognition  Cognition Arousal/Alertness: Awake/alert Behavior During Therapy: Anxious Overall Cognitive Status: No family/caregiver present to determine baseline cognitive functioning Area of Impairment: Safety/judgement;Awareness General Comments: pt  able to answer questions related to prior functipn at [facility]    Extremity/Trunk  Assessment Upper Extremity Assessment Upper Extremity  Assessment: Generalized weakness Lower Extremity Assessment Lower Extremity Assessment: RLE deficits/detail RLE Deficits / Details: decreased active dorsiflexion   Balance Balance Overall balance assessment: Needs assistance Sitting-balance support: Bilateral upper extremity supported Sitting balance-Leahy Scale: Fair Sitting balance - Comments: able to sit edge of bed with supervision  End of Session PT - End of Session Equipment Utilized During Treatment: Gait belt Activity Tolerance: Patient tolerated treatment well Patient left: in chair;with call bell/phone within reach;with chair alarm set Nurse Communication: Mobility status;Need for lift equipment (stedy ot [facility])  GP     [name] 02/03/2014, 11:01 AM Tresa Endo PT [phone]

## 2014-02-03 NOTE — Progress Notes (Signed)
Patient is suppose to be on thicken liquids and this was also told to the patient's daughter, however patient and daughter refused this order and consumed regular diet.

## 2014-02-03 NOTE — Progress Notes (Signed)
PROGRESS NOTE  [name] NWG:[id number] DOB: 05-03-1941 DOA: 01/21/2014 PCP: Cyndee Brightly, MD  HPI: 22 yoF hospitalized at [facility] 1/7-1/13/15 with E.coli urosepsis with septic shock. Per notes from SNF on 1/23, pt was found with rash under breast and abdominal folds treated with nystatin cream. Pt presented to ED 2/2 with reported fever and SOB at SNF. Pt found febrile, tachycardic, hypotensive. Sepsis protocol started and empiric abx with Vanc, Cefepime. Of note, recent E.coli from urine culture was an ESBL producer and patient was treated with Cefepime inpatient, changed to Cipro at discharge (not susceptible).   Assessment/Plan: Septic shock due to ESBL UTI - resolved, patient now stable on the floor - stop IV steroids, quick Pred taper - continue Lasix for now for fluid control ESBL UTI - s/p Primaxin 2/2 >> 14 Acute hypoxic respiratory failure - on Christoval, extubated 2/13. Now DNR/DNI COPD - stable, no wheezing - continue Pulmicort, Nebs HTN - Metoprolol AG metabolic acidosis - resolved GERD - pepcid Anemia - stable, in the setting of sepsis DM2 - SSI, Lantus 20 U daily Anxiety / Bipolar disorder - sitter, ?also in the setting of steroids use.  - Xanax 1 TID - was on precedex while in the ICU, this was discontinued 2/14 - continue effexor 75 BID, oxy IR (change to XR), neurontin 600 q 8h  - trazodone 150 nightly - seroquel 225 bid  Diet: Dys1 Fluids: SL DVT Prophylaxis: Lovenox  Code Status: DNR Family Communication: none this morning  Disposition Plan: inpatient  Consultants:  PCCM  Palliative  Psychiatry   SIGNIFICANT EVENTS / STUDIES / Procedures 1/12 - CT angio chest >emphysema. Right consolidation. 2 nodules in right apex (f/u CT 4-6 weeks recommended)  2/02 - Admitted to PCCM, [place] placed, shock  2/03 - intubated for resp distress  2/07 - AF w RVR  2/09 - failed SBT  2/11 - tolerating SBT on propofol  2/12 - tolerates SBT 5/5 with good mechanics on  propofol  2/13 - tolerating SBT 5/5, much improved anxiety  Microbiology:  1/6 urine >> pan-sensitive E. Coli 1/12 urine >> E. Coli +ESBL+ 2/2 urine >100k E.coli +ESBL+ 2/2 blood x 2 >> NGF 2/2 MRSA PCR (+) 2/3 RSV panel: neg   Antibiotics Vancomycin 2/2>>> 2/5  Primaxin 2/2>>>2/14  HPI/Subjective: Yelling for help as soon as one walks out of the room, alert, oriented x 4, appropriate in conversation and able to clearly state what she wishes, but very anxious appearing  Objective: Filed Vitals:   02/02/14 1900 02/02/14 2000 02/02/14 2010 02/03/14 0605  BP: 128/50 112/72  156/71  Pulse: 99 102  108  Temp:  99.3 F (37.4 C)  99.1 F (37.3 C)  TempSrc:  Oral  Oral  Resp: 20 20  20   Height:   5\' 2"  (1.575 m)   Weight:   86.8 kg (191 lb 5.8 oz)   SpO2: 100% 100% 99% 99%    Intake/Output Summary (Last 24 hours) at 02/03/14 0733 Last data filed at 02/03/14 0700  Gross per 24 hour  Intake 1412.6 ml  Output   3750 ml  Net -2337.4 ml   Filed Weights   02/01/14 0500 02/02/14 0500 02/02/14 2010  Weight: 87.6 kg (193 lb 2 oz) 86.7 kg (191 lb 2.2 oz) 86.8 kg (191 lb 5.8 oz)   Exam:  General:  Anxious  Cardiovascular: regular rate and rhythm, without MRG  Respiratory: good air movement, overall decreased breath sounds, no wheezing  Abdomen: soft, not  tender to palpation, positive bowel sounds  MSK: trace peripheral edema  Neuro: non focal  Data Reviewed: Basic Metabolic Panel:  Recent Labs Lab 01/29/14 0350 01/30/14 0500 01/31/14 0540 02/01/14 0540 02/02/14 0530 02/03/14 0358  NA 147 143 141 141 140 138  K 3.4* 3.5* 4.2 4.0 4.3 4.1  CL 107 104 103 101 101 96  CO2 31 32 30 28 29  32  GLUCOSE 174* 163* 231* 198* 172* 224*  BUN 21 21 20 21 15 14   CREATININE 0.44* 0.44* 0.39* 0.35* 0.37* 0.39*  CALCIUM 8.5 8.5 8.9 8.9 9.2 9.1  MG 2.0 2.0 2.0 2.1  --  2.0  PHOS 3.9 4.1 3.7 3.8  --  4.3   CBC:  Recent Labs Lab 01/30/14 0500 01/31/14 0540 02/01/14 0540  02/02/14 0530 02/03/14 0358  WBC 6.1 8.3 7.5 9.0 8.1  NEUTROABS  --   --   --   --  5.1  HGB 8.8* 8.7* 8.6* 9.9* 9.8*  HCT 29.7* 29.6* 28.3* 31.4* 31.5*  MCV 93.4 92.8 92.2 90.8 90.3  PLT 317 358 356 448* 466*   BNP (last 3 results)  Recent Labs  12/30/13 1326 01/29/14 0350 02/03/14 0358  PROBNP 1309.0* 380.5* 975.6*   CBG:  Recent Labs Lab 02/02/14 0741 02/02/14 1301 02/02/14 1635 02/02/14 2002 02/02/14 2056  GLUCAP 129* 203* 159* 151* 180*    Recent Results (from the past 240 hour(s))  CULTURE, RESPIRATORY (NON-EXPECTORATED)     Status: None   Collection Time    01/24/14  2:45 PM      Result Value Ref Range Status   Specimen Description SPUTUM   Final   Special Requests NONE   Final   Gram Stain     Final   Value: FEW WBC PRESENT,BOTH PMN AND MONONUCLEAR     NOEP NO ORGANISMS SEEN     Performed at [facility]   Culture     Final   Value: FEW YEAST CONSISTENT WITH CANDIDA SPECIES     Performed at [facility]   Report Status 01/26/2014 FINAL   Final  CLOSTRIDIUM DIFFICILE BY PCR     Status: None   Collection Time    01/31/14  1:15 AM      Result Value Ref Range Status   C difficile by pcr NEGATIVE  NEGATIVE Final   Comment: Performed at [facility]     Studies: Dg Chest Port 1 View  02/02/2014   CLINICAL DATA:  Evaluate ET tube.  EXAM: PORTABLE CHEST - 1 VIEW  COMPARISON:  02/01/2014.  FINDINGS: Left IJ central venous catheter tip projects over the superior vena cava. Patient is rotated, limiting evaluation.  Stable cardiac and mediastinal contours. Persistent small left pleural effusion underlying consolidative opacities. Unchanged pulmonary vascular redistribution.  No endotracheal tube is identified on this examination. Interval removal of enteric tube.  IMPRESSION: No endotracheal tube is identified, correlate for interval extubation.  Unchanged small left pleural effusion and underlying consolidative opacities.   Electronically  Signed   By: Lovey Newcomer M.D.   On: 02/02/2014 07:56    Scheduled Meds: . acetaminophen (TYLENOL) oral liquid 160 mg/5 mL  650 mg Oral Once  . ALPRAZolam  1 mg Per Tube TID  . budesonide  0.25 mg Nebulization BID  . chlorhexidine  15 mL Mouth Rinse BID  . enoxaparin (LOVENOX) injection  40 mg Subcutaneous Q24H  . famotidine  20 mg Oral QHS  . furosemide  40 mg Intravenous  Daily  . gabapentin  600 mg Per Tube 3 times per day  . insulin aspart  0-20 Units Subcutaneous TID WC  . insulin glargine  20 Units Subcutaneous Daily  . ipratropium-albuterol  3 mL Nebulization Q6H  . methylPREDNISolone (SOLU-MEDROL) injection  60 mg Intravenous Q24H  . metoprolol tartrate  50 mg Oral BID  . multivitamin with minerals  1 tablet Oral Daily  . OxyCODONE  10 mg Oral Q12H  . potassium chloride  40 mEq Oral Once  . QUEtiapine  225 mg Per Tube BID  . traZODone  150 mg Per Tube QHS  . 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 Any transcriptional errors are unintentional.   Marzetta Board, MD [facility] Pager [phone]. If 7 PM - 7 AM, please contact night-coverage at [email or web address], password [email or web address] 02/03/2014, 7:33 AM  LOS: 13 days

## 2014-02-03 NOTE — Progress Notes (Signed)
Patient's daughter took patient off the floor without letting staff know, tele was also removed.  MD notified and patient was returned to floor and educated patient and daughter on not leaving the unit without a doctor's order. Patient and daughter verbalized they understood.

## 2014-02-03 NOTE — Progress Notes (Signed)
Clinical Social Work Department CLINICAL SOCIAL WORK PLACEMENT NOTE 02/03/2014  Patient:  [name], [name]  Account Number:  [id number] Admit date:  01/21/2014  Clinical Social Worker:  Levie Heritage  Date/time:  02/03/2014 10:10 AM  Clinical Social Work is seeking post-discharge placement for this patient at the following level of care:   SKILLED NURSING   (*CSW will update this form in Epic as items are completed)   02/03/2014  Patient/family provided with [place] Department of Clinical Social Work's list of facilities offering this level of care within the geographic area requested by the patient (or if unable, by the patient's family).  02/03/2014  Patient/family informed of their freedom to choose among providers that offer the needed level of care, that participate in Medicare, Medicaid or managed care program needed by the patient, have an available bed and are willing to accept the patient.  02/03/2014  Patient/family informed of [facility]' ownership interest in [facility], as well as of the fact that they are under no obligation to receive care at this facility.  PASARR submitted to E[employer] on  PASARR number received from [facility] on   FL2 transmitted to all facilities in geographic area requested by pt/family on  02/03/2014 FL2 transmitted to all facilities within larger geographic area on   Patient informed that his/her managed care company has contracts with or will negotiate with  certain facilities, including the following:     Patient/family informed of bed offers received:   Patient chooses bed at  Physician recommends and patient chooses bed at    Patient to be transferred to  on   Patient to be transferred to facility by   The following physician request were entered in Epic:   Additional Comments:  Bernita Raisin, [place] Work [phone]

## 2014-02-03 NOTE — Progress Notes (Signed)
Pt had a formed stool this AM and flexiseal came out.  Have kept it out so pt can pass stool.  Pt remains very vocal, demanding, yelling.  No changes otherwise.  Dislikes thickened liquids.

## 2014-02-04 ENCOUNTER — Inpatient Hospital Stay (HOSPITAL_COMMUNITY): Payer: Medicare Other

## 2014-02-04 LAB — BASIC METABOLIC PANEL
BUN: 10 mg/dL (ref 6–23)
CALCIUM: 9 mg/dL (ref 8.4–10.5)
CO2: 29 mEq/L (ref 19–32)
CREATININE: 0.36 mg/dL — AB (ref 0.50–1.10)
Chloride: 97 mEq/L (ref 96–112)
GFR calc Af Amer: >90 mL/min (ref >90)
GFR calc non Af Amer: >90 mL/min (ref >90)
GLUCOSE: 231 mg/dL — AB (ref 70–99)
Potassium: 3.6 mEq/L — ABNORMAL LOW (ref 3.7–5.3)
Sodium: 138 mEq/L (ref 137–147)

## 2014-02-04 LAB — CBC WITH DIFFERENTIAL/PLATELET
BASOS ABS: 0 10*3/uL (ref 0.0–0.1)
Basophils Relative: 1 % (ref 0–1)
Eosinophils Absolute: 0.1 10*3/uL (ref 0.0–0.7)
Eosinophils Relative: 2 % (ref 0–5)
HCT: 31.9 % — ABNORMAL LOW (ref 36.0–46.0)
Hemoglobin: 10.1 g/dL — ABNORMAL LOW (ref 12.0–15.0)
LYMPHS PCT: 32 % (ref 12–46)
Lymphs Abs: 2.6 10*3/uL (ref 0.7–4.0)
MCH: 28.7 pg (ref 26.0–34.0)
MCHC: 31.7 g/dL (ref 30.0–36.0)
MCV: 90.6 fL (ref 78.0–100.0)
Monocytes Absolute: 0.8 10*3/uL (ref 0.1–1.0)
Monocytes Relative: 9 % (ref 3–12)
Neutro Abs: 4.7 10*3/uL (ref 1.7–7.7)
Neutrophils Relative %: 57 % (ref 43–77)
Platelets: 464 10*3/uL — ABNORMAL HIGH (ref 150–400)
RBC: 3.52 MIL/uL — ABNORMAL LOW (ref 3.87–5.11)
RDW: 15.7 % — ABNORMAL HIGH (ref 11.5–15.5)
WBC: 8.2 10*3/uL (ref 4.0–10.5)

## 2014-02-04 LAB — GLUCOSE, CAPILLARY
GLUCOSE-CAPILLARY: 213 mg/dL — AB (ref 70–99)
GLUCOSE-CAPILLARY: 176 mg/dL — AB (ref 70–99)
Glucose-Capillary: 238 mg/dL — ABNORMAL HIGH (ref 70–99)

## 2014-02-04 LAB — MAGNESIUM: Magnesium: 1.9 mg/dL (ref 1.5–2.5)

## 2014-02-04 LAB — PHOSPHORUS: Phosphorus: 2.7 mg/dL (ref 2.3–4.6)

## 2014-02-04 MED ORDER — ALBUTEROL SULFATE (2.5 MG/3ML) 0.083% IN NEBU
2.5000 mg | INHALATION_SOLUTION | RESPIRATORY_TRACT | Status: DC | PRN
  Administered 2014-02-05: 2.5 mg via RESPIRATORY_TRACT
  Filled 2014-02-04: qty 3

## 2014-02-04 MED ORDER — PREDNISONE 10 MG PO TABS
10.0000 mg | ORAL_TABLET | Freq: Every day | ORAL | Status: DC
  Administered 2014-02-04 – 2014-02-05 (×2): 10 mg via ORAL
  Filled 2014-02-04 (×3): qty 1

## 2014-02-04 MED ORDER — IPRATROPIUM-ALBUTEROL 0.5-2.5 (3) MG/3ML IN SOLN
3.0000 mL | Freq: Four times a day (QID) | RESPIRATORY_TRACT | Status: DC
  Administered 2014-02-05 (×2): 3 mL via RESPIRATORY_TRACT
  Filled 2014-02-04 (×2): qty 3

## 2014-02-04 NOTE — Progress Notes (Signed)
Inpatient Diabetes Program Recommendations  AACE/ADA: New Consensus Statement on Inpatient Glycemic Control (2013)  Target Ranges:  Prepandial:   less than 140 mg/dL      Peak postprandial:   less than 180 mg/dL (1-2 hours)      Critically ill patients:  140 - 180 mg/dL   Reason for Visit: Hyperglycemia  Diabetes history: Type 2 Outpatient Diabetes medications: Lantus 22 units QAM, Novolog 3 units tidwc and +5 units if CBG >150 mg/dL, metformin 1000 mg Q24 Current orders for Inpatient glycemic control: Lantus 20 units QAM, Novolog resistant tidwc  Results for Kristen, [name] (MRN [id number]) as of 02/04/2014 11:54  Ref. Range 02/03/2014 17:05 02/03/2014 21:40  Glucose-Capillary Latest Range: 70-99 mg/dL 285 (H) 191 (H)   Steroids being tapered. Consider addition of meal coverage insulin.  Inpatient Diabetes Program Recommendations Insulin - Basal: Increase Lantus to home dose of 22 units Q24H Insulin - Meal Coverage: Add Novolog 3 units tidwc for meal coverage insulin if pt eats >50% meal (also has home orders for this) HgbA1C: xxx  Note: Will continue to follow. Thank you. Lorenda Peck, RD, LDN, CDE Inpatient Diabetes Coordinator [phone]

## 2014-02-04 NOTE — Progress Notes (Signed)
PROGRESS NOTE  [name] [id number] DOB: 03/02/1941 DOA: 01/21/2014 PCP: Cyndee Brightly, MD  HPI: 83 yoF hospitalized at [facility] 1/7-1/13/15 with E.coli urosepsis with septic shock. Per notes from SNF on 1/23, pt was found with rash under breast and abdominal folds treated with nystatin cream. Pt presented to ED 2/2 with reported fever and SOB at SNF. Pt found febrile, tachycardic, hypotensive. Sepsis protocol started and empiric abx with Vanc, Cefepime. Of note, recent E.coli from urine culture was an ESBL producer and patient was treated with Cefepime inpatient, changed to Cipro at discharge (not susceptible).   Assessment/Plan: Septic shock due to ESBL UTI - resolved, patient now stable on the floor - stop IV steroids, quick Pred taper - continue Lasix for now for fluid control ESBL UTI - s/p Primaxin 2/2 >> 14 Acute hypoxic respiratory failure - on [place], extubated 2/13. Now DNR/DNI COPD - stable, no wheezing - continue Pulmicort, Nebs HTN - Metoprolol AG metabolic acidosis - resolved GERD - pepcid Anemia - stable, in the setting of sepsis DM2 - SSI, Lantus 20 U daily Neurogenic bladder - remove foley today and monitor.  - I have set up an outpatient follow up appointment with Dr. McDermick at 02/11/2014 at 1:15 pm.  Anxiety / Bipolar disorder - sitter, ?also in the setting of steroids use.  - Xanax, seroquel - was on precedex while in the ICU, this was discontinued 2/14 - continue effexor 75 BID, oxy IR (change to XR), neurontin 600 q 8h  - trazodone 150 nightly - seroquel 225 bid  Diet: Dys1 Fluids: SL DVT Prophylaxis: Lovenox  Code Status: DNR Family Communication: none this morning  Disposition Plan: inpatient  Consultants:  PCCM  Palliative  Psychiatry   SIGNIFICANT EVENTS / STUDIES / Procedures 1/12 - CT angio chest >emphysema. Right consolidation. 2 nodules in right apex (f/u CT 4-6 weeks recommended)  2/02 - Admitted to PCCM, [place] placed, shock    2/03 - intubated for resp distress  2/07 - AF w RVR  2/09 - failed SBT  2/11 - tolerating SBT on propofol  2/12 - tolerates SBT 5/5 with good mechanics on propofol  2/13 - tolerating SBT 5/5, much improved anxiety  Microbiology:  1/6 urine >> pan-sensitive E. Coli 1/12 urine >> E. Coli +ESBL+ 2/2 urine >100k E.coli +ESBL+ 2/2 blood x 2 >> NGF 2/2 MRSA PCR (+) 2/3 RSV panel: neg   Antibiotics Vancomycin 2/2>>> 2/5  Primaxin 2/2>>>2/14  HPI/Subjective: - feeling better this morning, is calmer  Objective: Filed Vitals:   02/03/14 2138 02/04/14 0512 02/04/14 0815 02/04/14 1019  BP: 138/55 141/66  120/95  Pulse: 106 96  103  Temp: 98.2 F (36.8 C) 97.4 F (36.3 C)    TempSrc: Oral Oral    Resp: 20 20    Height:      Weight:  84.4 kg (186 lb 1.1 oz)    SpO2: 100% 100% 99%     Intake/Output Summary (Last 24 hours) at 02/04/14 1337 Last data filed at 02/04/14 0800  Gross per 24 hour  Intake    960 ml  Output   2475 ml  Net  -1515 ml   Filed Weights   02/02/14 0500 02/02/14 2010 02/04/14 0512  Weight: 86.7 kg (191 lb 2.2 oz) 86.8 kg (191 lb 5.8 oz) 84.4 kg (186 lb 1.1 oz)   Exam:  General:  Anxious  Cardiovascular: regular rate and rhythm, without MRG  Respiratory: good air movement, overall decreased breath sounds, no  wheezing  Abdomen: soft, not tender to palpation, positive bowel sounds  MSK: trace peripheral edema  Neuro: non focal  Data Reviewed: Basic Metabolic Panel:  Recent Labs Lab 01/30/14 0500 01/31/14 0540 02/01/14 0540 02/02/14 0530 02/03/14 0358 02/04/14 0411  NA 143 141 141 140 138 138  K 3.5* 4.2 4.0 4.3 4.1 3.6*  CL 104 103 101 101 96 97  CO2 32 30 28 29  32 29  GLUCOSE 163* 231* 198* 172* 224* 231*  BUN 21 20 21 15 14 10   CREATININE 0.44* 0.39* 0.35* 0.37* 0.39* 0.36*  CALCIUM 8.5 8.9 8.9 9.2 9.1 9.0  MG 2.0 2.0 2.1  --  2.0 1.9  PHOS 4.1 3.7 3.8  --  4.3 2.7   CBC:  Recent Labs Lab 01/31/14 0540 02/01/14 0540  02/02/14 0530 02/03/14 0358 02/04/14 0411  WBC 8.3 7.5 9.0 8.1 8.2  NEUTROABS  --   --   --  5.1 4.7  HGB 8.7* 8.6* 9.9* 9.8* 10.1*  HCT 29.6* 28.3* 31.4* 31.5* 31.9*  MCV 92.8 92.2 90.8 90.3 90.6  PLT 358 356 448* 466* 464*   BNP (last 3 results)  Recent Labs  12/30/13 1326 01/29/14 0350 02/03/14 0358  PROBNP 1309.0* 380.5* 975.6*   CBG:  Recent Labs Lab 02/03/14 1133 02/03/14 1705 02/03/14 2140 02/04/14 0709 02/04/14 1204  GLUCAP 151* 285* 191* 213* 176*   Recent Results (from the past 240 hour(s))  CLOSTRIDIUM DIFFICILE BY PCR     Status: None   Collection Time    01/31/14  1:15 AM      Result Value Ref Range Status   C difficile by pcr NEGATIVE  NEGATIVE Final   Comment: Performed at [facility]   Studies: No results found.  Scheduled Meds: . acetaminophen (TYLENOL) oral liquid 160 mg/5 mL  650 mg Oral Once  . ALPRAZolam  1 mg Oral 4 times per day  . budesonide  0.25 mg Nebulization BID  . chlorhexidine  15 mL Mouth Rinse BID  . enoxaparin (LOVENOX) injection  40 mg Subcutaneous Q24H  . famotidine  20 mg Oral QHS  . furosemide  40 mg Intravenous Daily  . gabapentin  600 mg Oral TID  . insulin aspart  0-20 Units Subcutaneous TID WC  . insulin glargine  20 Units Subcutaneous Daily  . ipratropium-albuterol  3 mL Nebulization Q6H  . metoprolol tartrate  50 mg Oral BID  . multivitamin with minerals  1 tablet Oral Daily  . OxyCODONE  10 mg Oral Q12H  . potassium chloride  40 mEq Oral Once  . predniSONE  10 mg Oral Q breakfast  . QUEtiapine  225 mg Per Tube BID  . traZODone  150 mg Per Tube QHS  . venlafaxine  75 mg Per Tube BID   Continuous Infusions:   Principal Problem:   Septic shock Active Problems:   DIABETES MELLITUS, TYPE II   HYPERLIPIDEMIA   OBESITY, NOS   ANEMIA-NOS   BIPOLAR DISORDER   ANXIETY   INSOMNIA, CHRONIC   Other chronic pain   HYPERTENSION, BENIGN SYSTEMIC   COPD   GERD   Type I (juvenile type) diabetes mellitus  with neurological manifestations, uncontrolled(250.63)   Sepsis   Infection due to ESBL-producing Escherichia coli  Time spent: 35  This note has been created with Surveyor, quantity. Any transcriptional errors are unintentional.   Marzetta Board, MD [facility] Pager [phone]. If 7 PM - 7 AM, please  contact night-coverage at [email or web address], password [facility] 02/04/2014, 1:37 PM  LOS: 14 days

## 2014-02-04 NOTE — Procedures (Signed)
Objective Swallowing Evaluation: Modified Barium Swallowing Study  Patient Details  Name: [name] MRN: [id number] Date of Birth: [birth year]  Today's Date: 02/04/2014 Time: 1440-1500 SLP Time Calculation (min): 20 min  Past Medical History:  Past Medical History  Diagnosis Date  . Anxiety   . Arthritis   . COPD (chronic obstructive pulmonary disease)   . GERD (gastroesophageal reflux disease)   . Hyperlipidemia   . Osteoporosis   . DVT (deep venous thrombosis)   . PE (pulmonary thromboembolism)   . Hypertension      PT DENIES....ON NO  MEDS  . Diabetes mellitus   . Cancer 1969    cervical  . Pneumonia     h/o  . Bronchitis     h/o  . Blood transfusion   . Anemia   . Bipolar affect, depressed    Past Surgical History:  Past Surgical History  Procedure Laterality Date  . Vena cava filter placement    . 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 Eye surgery      CATARACTS  . Achilles tendon lengthening  11/18/11    and repair w/posterior tibial tendon lengthening; right  foot  . Cholecystectomy    . Abdominal hysterectomy  1969    "womb taken out for cervical cancer"  . Cataract extraction, bilateral  ~ 2008  . Tonsillectomy      "as a child"  . Achilles tendon surgery  11/18/2011    Procedure: ACHILLES TENDON REPAIR;  Surgeon: Toni Arthurs, MD;  Location: [facility] OR;  Service: Orthopedics;  Laterality: Right;  Right Posterior Tibial Tendon Lenghtening and Tendon Achilles Lenghtening    HPI:  73 y/o resident of [facility] found by staff with fever and hypoxia. Taken to ED on NRB 100% with improvement. PCCM asked to see patient for respiratory needs and hypotension refractory to IVF. Recent admission to hospital 12/25/13 for ESBL E. Coli>>Urosepsis.     Assessment / Plan / Recommendation Clinical Impression  Dysphagia Diagnosis: Mild pharyngeal phase dysphagia Clinical impression: Patient presents with a mild pharyngeal phase dysphagia characterized by a delay in  swallow initiation resulting in deep silent penetration of thin liquids. SLP provided verbal and visual instruction for chin tuck which did not prevent penetration however cues for immediate throat clear successful to clear vestibule and prevent aspiration. Patient receptive to carryout of this strategy. Education provided with daughter regarding results of test, recommendations for diet and precautions, and prognosis for improvement. Although patient with h/o dysphagia with silent aspiration, current difficulty may additionally be attributed to or exacerbated by recent prolonged dysphagia and has the potential to improve. SLP will f/u at bedside for treatment.     Treatment Recommendation  Therapy as outlined in treatment plan below    Diet Recommendation Dysphagia 3 (Mechanical Soft);Thin liquid   Liquid Administration via: Cup;No straw Medication Administration: Whole meds with puree Supervision: Patient able to self feed;Full supervision/cueing for compensatory strategies Compensations: Slow rate;Small sips/bites;Clear throat after each swallow Postural Changes and/or Swallow Maneuvers: Seated upright 90 degrees    Other  Recommendations Oral Care Recommendations: Oral care BID   Follow Up Recommendations  Skilled Nursing facility    Frequency and Duration min 2x/week  2 weeks           General HPI: 73 y/o resident of [facility] found by staff with fever and hypoxia. Taken to ED on NRB 100% with improvement. PCCM asked to see patient for respiratory  needs and hypotension refractory to IVF. Recent admission to hospital 12/25/13 for ESBL E. Coli>>Urosepsis. Type of Study: Modified Barium Swallowing Study Reason for Referral: Objectively evaluate swallowing function Previous Swallow Assessment: MBS in 2013 indicated silent aspiration Diet Prior to this Study: Dysphagia 1 (puree);Nectar-thick liquids Temperature Spikes Noted: No Respiratory Status: Nasal cannula History of Recent  Intubation: Yes Length of Intubations (days): 10 days Date extubated: 02/01/14 Behavior/Cognition: Alert;Cooperative Oral Cavity - Dentition: Edentulous Oral Motor / Sensory Function: Within functional limits Self-Feeding Abilities: Able to feed self Patient Positioning: Upright in chair Baseline Vocal Quality: Clear Volitional Cough: Strong Volitional Swallow: Unable to elicit Anatomy: Within functional limits Pharyngeal Secretions: Not observed secondary MBS    Reason for Referral Objectively evaluate swallowing function   Oral Phase Oral Preparation/Oral Phase Oral Phase: WFL   Pharyngeal Phase Pharyngeal Phase Pharyngeal Phase: Impaired Pharyngeal - Thin Pharyngeal - Thin Cup: Delayed swallow initiation;Premature spillage to valleculae;Premature spillage to pyriform sinuses;Penetration/Aspiration before swallow Penetration/Aspiration details (thin cup): Material enters airway, CONTACTS cords and not ejected out Pharyngeal - Solids Pharyngeal - Puree: Delayed swallow initiation;Premature spillage to valleculae;Pharyngeal residue - valleculae Pharyngeal - Mechanical Soft: Delayed swallow initiation;Premature spillage to valleculae;Pharyngeal residue - valleculae Pharyngeal - Pill: Within functional limits (provided whole in puree)  Cervical Esophageal Phase    GO    Cervical Esophageal Phase Cervical Esophageal Phase: [facility]        Ferdinand Lango MA, CCC-SLP [phone]  [name] Meryl 02/04/2014, 3:22 PM

## 2014-02-04 NOTE — Discharge Instructions (Signed)
An appointment has been made with Urology on 2/23 at 1:15 pm.

## 2014-02-04 NOTE — Progress Notes (Signed)
Physical Therapy Treatment Patient Details Name: [name] MRN: [id number] DOB: [birth year] Today's Date: 02/04/2014 Time: 0347-4259 PT Time Calculation (min): 24 min  PT Assessment / Plan / Recommendation  History of Present Illness 73 yo caucasian female resident of [place] found by staff with fever and hypoxia. Taken to ED on NRB 100% with improvement. PCCM asked to see patient for respiratory needs and hypotension refractory to IVF. Recent admission to hospital 12/25/13 for ESBL E. Coli>>Urosepsis VDRF from 2/3-2/13.   PT Comments   Pt OOB in recliner incont of urine (lasix).  Assisted pt to standing + 2 assist then amb using EVA walker.  Pt reports she uses a power WC to get to the dinning room and has lived at [facility] for about 2 years.  Pt very motivated to work with PT as she shared that she has been in the hospital a long time and feels really weak.   Follow Up Recommendations  SNF     Does the patient have the potential to tolerate intense rehabilitation     Barriers to Discharge        Equipment Recommendations  None recommended by PT    Recommendations for Other Services    Frequency Min 2X/week   Progress towards PT Goals Progress towards PT goals: Progressing toward goals  Plan      Precautions / Restrictions Precautions Precautions: Fall Precaution Comments: contact. Required Braces or Orthoses: Other Brace/Splint Other Brace/Splint: R double upright Restrictions Weight Bearing Restrictions: No   Pertinent Vitals/Pain 2 lts O2 HR increased to 148 during gait then decreased to 110    Mobility  Bed Mobility General bed mobility comments: oob in recliner Transfers Overall transfer level: Needs assistance Transfers: Sit to/from Stand Sit to Stand: +2 physical assistance;+2 safety/equipment;Max assist General transfer comment: from recliner assisted with standing and used EVA walker for increased support. 75% VC"s on proper tech and total assist to  scoot to back of recliner.  Ambulation/Gait Ambulation/Gait assistance: +2 physical assistance;+2 safety/equipment;Max assist Ambulation Distance (Feet): 8 Feet Assistive device:  (EVA walker ) Gait Pattern/deviations: Step-to pattern;Wide base of support Gait velocity: decreased General Gait Details: used EVA walker for increased support.  very limited amb distance/tolerance.  great difficulty weight shifting and advancing either LE.    PT Goals (current goals can now be found in the care plan section)    Visit Information  Last PT Received On: 02/04/14 Assistance Needed: +2 History of Present Illness: 73 yo caucasian female resident of [place] found by staff with fever and hypoxia. Taken to ED on NRB 100% with improvement. PCCM asked to see patient for respiratory needs and hypotension refractory to IVF. Recent admission to hospital 12/25/13 for ESBL E. Coli>>Urosepsis VDRF from 2/3-2/13.    Subjective Data      Cognition       Balance     End of Session PT - End of Session Equipment Utilized During Treatment: Gait belt Activity Tolerance: Patient limited by fatigue Patient left: in chair;with call bell/phone within reach;with chair alarm set Nurse Communication: Mobility status;Need for lift equipment   Rica Koyanagi  PTA [facility]  Acute  Rehab Pager      [phone]

## 2014-02-04 NOTE — Progress Notes (Signed)
Speech Language Pathology Treatment: Dysphagia  Patient Details Name: CHANON LONEY MRN: [id number] DOB: 03/01/1941 Today's Date: 02/04/2014 Time: 4098-1191 SLP Time Calculation (min): 18 min  Assessment / Plan / Recommendation Clinical Impression  F/u included clinician provided po trials for potential diet advancement. Mild hoarse vocal quality persists but improved with liquids trials. No overt s/s of aspiration noted with thin liquids with clinician providing min verbal cues for small single cup sips however patient with known h/o silent aspiration. Discussed with patient and daughter via phone who are both receptive to MBS to determine least restrictive diet and use of precautions which might mitigate aspiration risks in patient with known dysphagia. Will proceed with MBS this pm.    HPI HPI: 73 y/o resident of [facility] found by staff with fever and hypoxia. Taken to ED on NRB 100% with improvement. PCCM asked to see patient for respiratory needs and hypotension refractory to IVF. Recent admission to hospital 12/25/13 for ESBL E. Coli>>Urosepsis.      SLP Plan  MBS    Recommendations Diet recommendations: Dysphagia 1 (puree);Nectar-thick liquid Liquids provided via: Cup Medication Administration: Whole meds with puree Supervision: Full supervision/cueing for compensatory strategies;Staff to assist with self feeding Compensations: Slow rate;Small sips/bites Postural Changes and/or Swallow Maneuvers: Seated upright 90 degrees;Upright 30-60 min after meal              Oral Care Recommendations: Oral care BID Follow up Recommendations: Skilled Nursing facility Plan: [facility], [place] [phone]   Gabriel Rainwater Meryl 02/04/2014, 10:57 AM

## 2014-02-05 LAB — CBC WITH DIFFERENTIAL/PLATELET
BASOS ABS: 0.1 10*3/uL (ref 0.0–0.1)
BASOS PCT: 1 % (ref 0–1)
Eosinophils Absolute: 0.2 10*3/uL (ref 0.0–0.7)
Eosinophils Relative: 3 % (ref 0–5)
HEMATOCRIT: 30 % — AB (ref 36.0–46.0)
HEMOGLOBIN: 9.5 g/dL — AB (ref 12.0–15.0)
Lymphocytes Relative: 41 % (ref 12–46)
Lymphs Abs: 2.9 10*3/uL (ref 0.7–4.0)
MCH: 28.9 pg (ref 26.0–34.0)
MCHC: 31.7 g/dL (ref 30.0–36.0)
MCV: 91.2 fL (ref 78.0–100.0)
MONO ABS: 0.6 10*3/uL (ref 0.1–1.0)
Monocytes Relative: 9 % (ref 3–12)
NEUTROS PCT: 47 % (ref 43–77)
Neutro Abs: 3.4 10*3/uL (ref 1.7–7.7)
Platelets: 404 10*3/uL — ABNORMAL HIGH (ref 150–400)
RBC: 3.29 MIL/uL — ABNORMAL LOW (ref 3.87–5.11)
RDW: 15.7 % — AB (ref 11.5–15.5)
WBC: 7.1 10*3/uL (ref 4.0–10.5)

## 2014-02-05 LAB — GLUCOSE, CAPILLARY
GLUCOSE-CAPILLARY: 178 mg/dL — AB (ref 70–99)
GLUCOSE-CAPILLARY: 208 mg/dL — AB (ref 70–99)
Glucose-Capillary: 191 mg/dL — ABNORMAL HIGH (ref 70–99)

## 2014-02-05 LAB — BASIC METABOLIC PANEL
BUN: 11 mg/dL (ref 6–23)
CO2: 32 mEq/L (ref 19–32)
Calcium: 8.4 mg/dL (ref 8.4–10.5)
Chloride: 100 mEq/L (ref 96–112)
Creatinine, Ser: 0.4 mg/dL — ABNORMAL LOW (ref 0.50–1.10)
GFR calc non Af Amer: >90 mL/min (ref >90)
Glucose, Bld: 215 mg/dL — ABNORMAL HIGH (ref 70–99)
POTASSIUM: 3.3 meq/L — AB (ref 3.7–5.3)
Sodium: 140 mEq/L (ref 137–147)

## 2014-02-05 LAB — MAGNESIUM: Magnesium: 1.7 mg/dL (ref 1.5–2.5)

## 2014-02-05 LAB — PHOSPHORUS: Phosphorus: 2.7 mg/dL (ref 2.3–4.6)

## 2014-02-05 MED ORDER — PREDNISONE 10 MG PO TABS: 10.0000 mg | ORAL_TABLET | Freq: Every day | ORAL | Status: DC

## 2014-02-05 MED ORDER — OXYCODONE HCL ER 10 MG PO T12A: EXTENDED_RELEASE_TABLET | ORAL | Status: DC

## 2014-02-05 MED ORDER — HYDROCODONE-ACETAMINOPHEN 5-325 MG PO TABS: 1.0000 | ORAL_TABLET | Freq: Four times a day (QID) | ORAL | Status: DC | PRN

## 2014-02-05 MED ORDER — ALPRAZOLAM 1 MG PO TABS
ORAL_TABLET | ORAL | Status: DC
Start: 2014-02-05 — End: 2014-02-06

## 2014-02-05 NOTE — Discharge Summary (Signed)
Physician Discharge Summary  [name] [id number] DOB: 11-May-1941 DOA: 01/21/2014  PCP: Cyndee Brightly, MD  Admit date: 01/21/2014 Discharge date: 02/05/2014  Time spent: 35 minutes  Recommendations for Outpatient Follow-up:  1. Follow up with Urology as an outpatient as below 2. Follow up with PCP in 1-2 weeks post hospital stay 3. 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 Taken to ED on NRB 100% with improvement. PCCM asked to see patient for respiratory needs and hypotension refractory to IVF. Recent admission to hospital 12/25/13 for ESBL E. Coli>>Urosepsis.  Hospital Course:  Septic shock due to ESBL UTI - resolved, patient now stable on the floor. Stop IV steroids, quick Pred taper, she needs 2 additional days of 10 mg then discontinue.  ESBL UTI - s/p Primaxin 2/2 >> 14.  Urinary incontinence - she is voiding well after Foley removal. Will like to avoid Foley for now; I have arranged outpatient Urology follow up for patient as below.  Acute hypoxic respiratory failure - on [place], extubated 2/13. Now DNR/DNI. Oxygen support as needed. COPD - stable, no wheezing. Continue previous medications and oxygen support as needed.  HTN  - continue home medications.  AG metabolic acidosis - resolved  GERD - pepcid  Anemia - stable, in the setting of sepsis  DM2 - continue previous medications.  Neurogenic bladder - removed foley and patient voiding well. To see Dr. McDermick at 02/11/2014 at 1:15 pm.  Anxiety / Bipolar disorder - exacerbated by ICU stay, much calmer and at baseline after floor transfer.  - Xanax, seroquel  - was on precedex while in the ICU, this was discontinued 2/14  - continue effexor 75 BID, oxy IR (change to XR), neurontin 600 q 8h  - trazodone 150 nightly while in the hospital - seroquel 225 bid while in the hospital. She is to resume her previous dose on discharge.  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 Right consolidation. 2 nodules in right apex (f/u CT 4-6 weeks recommended)  2/02 - Admitted to PCCM, Davis placed, shock  2/03 - intubated for resp distress  2/07 - AF w RVR  2/09 - failed SBT  2/11 - tolerating SBT on propofol  2/12 - tolerates SBT 5/5 with good mechanics on propofol  2/13 - tolerating SBT 5/5, much improved anxiety   Microbiology:  1/6 urine >> pan-sensitive E. Coli 1/12 urine >> E. Coli +ESBL+ 2/2 urine >100k E.coli +ESBL+ 2/2 blood x 2 >> NGF 2/2 MRSA PCR (+) 2/3 RSV panel: neg  Antibiotics Vancomycin 2/2>>> 2/5  Primaxin 2/2>>>2/14  Discharge Exam: Filed Vitals:   02/04/14 2348 02/05/14 0630 02/05/14 0745 02/05/14 1011  BP: 162/70 120/93  142/62  Pulse: 112 122  102  Temp: 97.6 F (36.4 C) 97.9 F (36.6 C)  98.4 F (36.9 C)  TempSrc: Oral Oral  Oral  Resp: 16 24  18   Height:      Weight:  84.1 kg (185 lb 6.5 oz)    SpO2: 98% 96% 98% 100%   General: NAD Cardiovascular: RRR Respiratory: CTA biL  Discharge Instructions    Medication List         acetaminophen 325 MG tablet  Commonly known as:  TYLENOL  Take 650 mg by mouth every 4  (four) hours as needed for mild pain.     albuterol (2.5 MG/3ML) 0.083% nebulizer solution  Commonly known as:  PROVENTIL  Take 3 mLs (2.5 mg total) by nebulization every 2 (two) hours as needed for wheezing or shortness of breath.     ALPRAZolam 1 MG tablet  Commonly known as:  XANAX  Take one tablet by mouth four times daily     antiseptic oral rinse Liqd  15 mLs by Mouth Rinse route every 4 (four) hours as needed for dry mouth.     aspirin 81 MG chewable tablet  Chew 81 mg by mouth daily.     bisacodyl 10 MG suppository  Commonly known as:  DULCOLAX  Place 10 mg rectally 3 (three) times a week. On Monday, Wednesday, and Friday     feeding supplement (GLUCERNA SHAKE) Liqd  Take 237 mLs by mouth 3 (three) times daily between meals.     fish oil-omega-3 fatty acids 1000 MG capsule  Take 1 g by mouth 2 (two) times daily.     Fluticasone-Salmeterol 500-50 MCG/DOSE Aepb  Commonly known as:  ADVAIR DISKUS  Inhale 1 puff into the lungs 2 (two) times daily.     gabapentin 300 MG capsule  Commonly known as:  NEURONTIN  Take 3 capsules (900 mg total) by mouth 3 (three) times daily.     HYDROcodone-acetaminophen 5-325 MG per tablet  Commonly known as:  NORCO/VICODIN  Take 1 tablet by mouth every 6 (six) hours as needed for moderate pain.     insulin aspart 100 UNIT/ML FlexPen  Commonly known as:  NOVOLOG  Inject 3-5 Units into the skin 3 (three) times daily before meals. Give 3 units before meals with an additional 5 units for CBG> or < 150     insulin glargine 100 UNIT/ML injection  Commonly known as:  LANTUS  Inject 0.22 mLs (22 Units total) into the skin daily.     levalbuterol 1.25 MG/0.5ML nebulizer solution  Commonly known as:  XOPENEX  Take 1.25 mg by nebulization 4 (four) times daily.     levalbuterol 45 MCG/ACT inhaler  Commonly known as:  XOPENEX HFA  Inhale 2 puffs into the lungs every 4 (four) hours as needed for wheezing.     metFORMIN 1000 MG tablet    Commonly known as:  GLUCOPHAGE  Take 1,000 mg by mouth daily before supper.     metoprolol tartrate 25 MG tablet  Commonly known as:  LOPRESSOR  Take 1 tablet (25 mg total) by mouth 2 (two) times daily.     MILK OF MAGNESIA PO  Take 30 mLs by mouth See admin instructions. If no BM in 3 days, give 58ml in a 24 hours PRN.     MYLANTA PO  Take 30 mLs by mouth  every 4 (four) hours as needed (indigestion).     nystatin 100000 UNIT/ML suspension  Commonly known as:  MYCOSTATIN  Take 5 mLs (500,000 Units total) by mouth 4 (four) times daily.     omeprazole 20 MG tablet  Commonly known as:  PRILOSEC OTC  Take 20 mg by mouth 2 (two) times daily.     OxyCODONE 10 mg T12a 12 hr tablet  Commonly known as:  OXYCONTIN  Take one tablet by mouth every 12 hours. Do not crush     polyethylene glycol packet  Commonly known as:  MIRALAX / GLYCOLAX  Take 17 g by mouth daily.     potassium chloride 10 MEQ tablet  Commonly known as:  K-DUR,KLOR-CON  Take 5 mEq by mouth every evening.     predniSONE 10 MG tablet  Commonly known as:  DELTASONE  Take 1 tablet (10 mg total) by mouth daily with breakfast. 2 additional days     promethazine 25 MG tablet  Commonly known as:  PHENERGAN  Take 25 mg by mouth every 6 (six) hours as needed. nausea     QUEtiapine 200 MG tablet  Commonly known as:  SEROQUEL  Take 200 mg by mouth at bedtime.     REFRESH LACRI-LUBE Oint  Place 1 application into both eyes at bedtime.     simvastatin 10 MG tablet  Commonly known as:  ZOCOR  Take 10 mg by mouth at bedtime.     sodium phosphate enema  Commonly known as:  FLEET  Place 1 enema rectally daily as needed (constipation). follow package directions     tiotropium 18 MCG inhalation capsule  Commonly known as:  SPIRIVA  Place 1 capsule (18 mcg total) into inhaler and inhale daily.     torsemide 20 MG tablet  Commonly known as:  DEMADEX  Take 40 mg by mouth every morning.     traZODone 100 MG tablet   Commonly known as:  DESYREL  Take 100 mg by mouth at bedtime.     traZODone 50 MG tablet  Commonly known as:  DESYREL  Take 25 mg by mouth daily as needed (anxiety).     venlafaxine XR 150 MG 24 hr capsule  Commonly known as:  EFFEXOR-XR  Take 150 mg by mouth every morning.           Follow-up Information   Follow up with [facility] On 02/11/2014. (At 1:15 pm with Dr. McDermick. )    Contact information:   [address] [address] [phone]      Follow up with Cyndee Brightly, MD. Schedule an appointment as soon as possible for a visit in 1 week.   Specialty:  Internal Medicine   Contact information:   [place] [address] [phone]       The results of significant diagnostics from this hospitalization (including imaging, microbiology, ancillary and laboratory) are listed below for reference.    Significant Diagnostic Studies: Dg Chest Port 1 View  02/02/2014   CLINICAL DATA:  Evaluate ET tube.  EXAM: PORTABLE CHEST - 1 VIEW  COMPARISON:  02/01/2014.  FINDINGS: Left IJ central venous catheter tip projects over the superior vena cava. Patient is rotated, limiting evaluation.  Stable cardiac and mediastinal contours. Persistent small left pleural effusion underlying consolidative opacities. Unchanged pulmonary vascular redistribution.  No endotracheal tube is identified on this examination. Interval removal of enteric tube.  IMPRESSION: No endotracheal tube is identified,  correlate for interval extubation.  Unchanged small left pleural effusion and underlying consolidative opacities.   Electronically Signed   By: Annia Belt M.D.   On: 02/02/2014 07:56   Dg Chest Port 1 View  02/01/2014   CLINICAL DATA:  Hypoxia  EXAM: PORTABLE CHEST - 1 VIEW  COMPARISON:  January 31, 2014  FINDINGS: Endotracheal tube tip is 2.8 cm above the carina. Nasogastric tube tip and side port are in the stomach. Central catheter tip is in  the superior vena cava. No pneumothorax.  There is moderate interstitial edema bilaterally, stable. There are small effusions bilaterally. Heart is mildly enlarged. Pulmonary vascularity demonstrates mild pulmonary venous hypertension. There is no appreciable airspace consolidation or adenopathy. There is evidence of old trauma involving the proximal right humerus.  IMPRESSION: Tube and catheter positions appear essentially stable without apparent pneumothorax. Evidence of a degree of congestive heart failure, stable.   Electronically Signed   By: Bretta Bang M.D.   On: 02/01/2014 07:17   Dg Chest Port 1 View  01/31/2014   CLINICAL DATA:  Intubated patient.  EXAM: PORTABLE CHEST - 1 VIEW  COMPARISON:  Single view of the chest 01/30/2014.  FINDINGS: Support tubes and lines are unchanged. There small bilateral pleural effusions, larger on the left. Bibasilar atelectasis and pulmonary vascular congestion persist. Heart size is upper normal.  IMPRESSION: No marked change in small bilateral pleural effusions, basilar atelectasis and vascular congestion.   Electronically Signed   By: Drusilla Kanner M.D.   On: 01/31/2014 07:35   Dg Chest Port 1 View  01/30/2014   CLINICAL DATA:  Septic shock.  EXAM: PORTABLE CHEST - 1 VIEW  COMPARISON:  01/29/2014 and 01/28/2014  FINDINGS: Endotracheal tube is approximately 2 cm above the carina, unchanged. Central venous catheter and OG tube appear in good position. Small left pleural effusion and slight atelectasis at the right base persist. There is slight pulmonary vascular prominence.  IMPRESSION: No significant change.   Electronically Signed   By: Geanie Cooley M.D.   On: 01/30/2014 07:36   Dg Chest Port 1 View  01/29/2014   CLINICAL DATA:  Hypoxia  EXAM: PORTABLE CHEST - 1 VIEW  COMPARISON:  January 28, 2014  FINDINGS: Endotracheal tube tip is 1.0 cm above the carina. Central catheter tip is in the superior vena cava. Nasogastric tube tip and side port are below the  diaphragm. No pneumothorax.  There is patchy airspace consolidation in the left base, stable. There is atelectatic change in the right base, slightly less than on recent prior study. Heart size and pulmonary vascularity are normal.  IMPRESSION: Tube and catheter positions as described without pneumothorax. It may be prudent to consider retracting endotracheal tube approximately 3 cm. Persistent patchy airspace disease left base. Mild right base atelectasis.   Electronically Signed   By: Bretta Bang M.D.   On: 01/29/2014 07:01   Dg Chest Port 1 View  01/28/2014   CLINICAL DATA:  Pneumonia.  EXAM: PORTABLE CHEST - 1 VIEW  COMPARISON:  Chest radiograph January 27, 2014  FINDINGS: Endotracheal tube tip projects at the carina, unchanged. Nasogastric tube past the gastroesophageal junction, distal tip not imaged. Left internal jugular central venous catheter with distal tip projecting in proximal superior vena cava. Interstitial prominence with patchy airspace opacities are decreased. Trace pleural effusions. No pneumothorax. Cardiomediastinal silhouette is nonsuspicious. Soft tissue planes and included osseous structures are unchanged.  IMPRESSION: No apparent change in life-support lines, endotracheal tube tip projects at the  carina, consider 2 cm of retraction.  Slightly decreased airspace opacities may reflect improving pneumonia with trace residual pleural effusions.   Electronically Signed   By: Elon Alas   On: 01/28/2014 06:53   Dg Chest Port 1 View  01/27/2014   CLINICAL DATA:  Pneumonia  EXAM: PORTABLE CHEST - 1 VIEW  COMPARISON:  01/26/2014  FINDINGS: An endotracheal tube is noted approximately 1 cm above the carina. This could be withdrawn 2-3 cm. A nasogastric catheter is noted within the stomach and stable. A left central venous line is again seen and stable. The cardiac shadow is at the upper limits of normal. Patchy infiltrates are identified bilaterally but worse in the right mid and  lower lung. No to focal abnormality is same.  IMPRESSION: Endotracheal tube just above the carina. This could be withdrawn 2-3 cm.  Patchy infiltrates bilaterally.  No new focal abnormality is noted.   Electronically Signed   By: Inez Catalina M.D.   On: 01/27/2014 07:19   Dg Chest Port 1 View  01/26/2014   CLINICAL DATA:  73 year old female intubated. Sepsis. Initial encounter.  EXAM: PORTABLE CHEST - 1 VIEW  COMPARISON:  01/25/2014 and earlier.  FINDINGS: Portable AP semi upright view at 0444 hrs. Stable endotracheal tube, tip 19 mm above the carina. Stable left IJ central line. Stable enteric tube, side hole projects over the gastric air bubble.  Bilateral veiling basilar pulmonary opacity not significantly changed compatible with pleural effusions. Stable cardiac size and mediastinal contours. Superimposed streaky and interstitial pulmonary opacity not significantly changed. No pneumothorax.  IMPRESSION: 1.  Stable lines and tubes. 2. Stable with bilateral pleural effusions and streaky perihilar opacity.   Electronically Signed   By: Lars Pinks M.D.   On: 01/26/2014 06:59   Dg Chest Port 1 View  01/25/2014   CLINICAL DATA:  Endotracheal tube position.  EXAM: PORTABLE CHEST - 1 VIEW  COMPARISON:  Chest x-ray from yesterday  FINDINGS: Endotracheal tube ends in the mid thoracic trachea, improved from prior. Left IJ catheter, tip at the upper SVC. Enteric tube ends in the proximal stomach.  Given probable differences in positioning, unchanged appearance of bilateral pleural effusions which are small to moderate. There is pulmonary edema which also appear stable. Stable cardiomegaly. Mediastinal contours distorted by rightward rotation. No evidence of pneumothorax.  IMPRESSION: 1. Improved positioning of endotracheal tube. 2. No significant change in pulmonary edema and pleural effusions.   Electronically Signed   By: Jorje Guild M.D.   On: 01/25/2014 05:29   Dg Chest Port 1 View  01/24/2014   CLINICAL DATA:   Endotracheal tube positioned  EXAM: PORTABLE CHEST - 1 VIEW  COMPARISON:  January 23, 2014  FINDINGS: The heart size and mediastinal contours are stable. The heart size is enlarged. Endotracheal tube, left jugular central venous line, nasogastric tube are identified in good position without change. Diffuse patchy opacity are identified throughout bilateral lungs minimally worse compared to prior exam. There are bilateral pleural effusions. Consolidation of bilateral lung bases are stable. The visualized skeletal structures are stable.  IMPRESSION: Congestive heart failure slightly worse compared to prior exam. Bilateral pleural effusions. Persistent consolidation of both lung bases.  Life supporting devices in good position without interval change.   Electronically Signed   By: Abelardo Diesel M.D.   On: 01/24/2014 07:17   Portable Chest Xray In Am  01/23/2014   CLINICAL DATA:  Followup pneumonia  EXAM: PORTABLE CHEST - 1 VIEW  COMPARISON:  Chest x-ray from yesterday  FINDINGS: Endotracheal tube ends in the mid lower thoracic trachea, 2.3 cm above the carina. New orogastric tube, tip in the proximal stomach. IVC filter noted. Left IJ catheter in stable position.  Cardiomegaly which is stable from prior. Dense bibasilar opacities. The left mid lung is more densely consolidated today. Interstitial markings are diffusely coarsened, with cephalized blood flow. There are probable small bilateral pleural effusions. No pneumothorax.  IMPRESSION: 1. More extensive bilateral lung consolidation. 2. Suspect edema superimposed on the patient's pneumonia. 3. Tubes and lines are in good position, including the new orogastric tube.   Electronically Signed   By: Jorje Guild M.D.   On: 01/23/2014 06:44   Dg Chest Port 1 View  01/22/2014   CLINICAL DATA:  Intubation.  EXAM: PORTABLE CHEST - 1 VIEW  COMPARISON:  DG CHEST 1V PORT dated 01/22/2014  FINDINGS: Endotracheal tube 6 mm above the carina, proximal repositioning may prove  useful. Left IJ line in good anatomic in stable position. Poor inspiration with dense bilateral pulmonary infiltrates . Small pleural effusion on the left. No pneumothorax. Heart size normal.  IMPRESSION: 1. Interval intubation. Endotracheal tube 6 mm above the carina, proximal repositioning may prove useful. Left IJ line in stable position.  2. Dense bilateral pulmonary infiltrates.  Poor inspiration.  3. Small left pleural effusion.  These results will be called to the ordering clinician or representative by the Radiologist Assistant, and communication documented in the PACS Dashboard.   Electronically Signed   By: Marcello Moores  Register   On: 01/22/2014 17:26   Dg Chest Port 1 View  01/22/2014   CLINICAL DATA:  Airspace disease.  Hospital acquired pneumonia.  EXAM: PORTABLE CHEST - 1 VIEW  COMPARISON:  DG CHEST 1V PORT dated 01/21/2014; DG CHEST 1V PORT dated 01/21/2014; DG CHEST 1V PORT dated 01/21/2014; DG CHEST 1V PORT dated 12/30/2013; CT ANGIO CHEST W/CM &/OR WO/CM dated 12/31/2013  FINDINGS: Left IJ central line is unchanged. Monitoring leads project over the chest. Right basilar airspace disease is present in the right infrahilar region, slightly more pronounced than on yesterday's examination. Less pronounced airspace disease in the left lung appears slightly improved compared to prior. Monitoring leads project over the chest. Cardiopericardial silhouette unchanged. No pneumothorax.  IMPRESSION: Slight worsening of airspace disease in the right infrahilar region, probably representing fluctuating superimposed pulmonary edema. Mild improvement in airspace disease in the left lung. Unchanged left IJ central line with the tip in the mid SVC.   Electronically Signed   By: Dereck Ligas M.D.   On: 01/22/2014 07:32   Dg Chest Port 1 View  01/21/2014   CLINICAL DATA:  Respiratory distress.  EXAM: PORTABLE CHEST - 1 VIEW  COMPARISON:  Plain film of the chest earlier this same day. CT chest 12/31/2012.  FINDINGS: Left IJ  central venous catheter is identified. Bilateral airspace disease, worst in the right lung base, persists. Airspace opacity in the left lung has increased. No pneumothorax identified. Heart size is enlarged.  IMPRESSION: Increased bilateral airspace disease likely due to pulmonary edema.   Electronically Signed   By: Inge Rise M.D.   On: 01/21/2014 23:22   Dg Chest Port 1 View  01/21/2014   CLINICAL DATA:  Left IJ central line placement  EXAM: PORTABLE CHEST - 1 VIEW  COMPARISON:  Earlier the same day  FINDINGS: 1609 hrs. Left IJ central line is new in the interval. Catheter tip projects at the level of the mid SVC. No  evidence for left-sided pneumothorax.  Diffuse interstitial and patchy bilateral airspace disease persists with a basilar predominance of the airspace involvement. Cardiopericardial silhouette is borderline increased and stable in appearance. Telemetry leads overlie the chest.  IMPRESSION: The left IJ central line tip overlies the mid SVC.   Electronically Signed   By: Misty Stanley M.D.   On: 01/21/2014 16:15   Dg Chest Port 1 View  01/21/2014   CLINICAL DATA:  Fever with shortness of breath.  EXAM: PORTABLE CHEST - 1 VIEW  COMPARISON:  CT ANGIO CHEST W/CM &/OR WO/CM dated 12/31/2013; DG CHEST 1V PORT dated 12/30/2013  FINDINGS: 1206 hr. Two views were obtained. There are lower lung volumes with stable patchy bibasilar pulmonary opacities based on the second view. No progressive airspace disease or significant pleural effusion is identified. The heart size and mediastinal contours are stable. Deformity of the proximal right humerus appears stable.  IMPRESSION: Stable patchy bibasilar airspace opacities compared with recent prior studies. No new findings demonstrated.   Electronically Signed   By: Camie Patience M.D.   On: 01/21/2014 12:37   Dg Chest Port 1v Same Day  01/24/2014   CLINICAL DATA:  Status post intubation  EXAM: PORTABLE CHEST - 1 VIEW SAME DAY  COMPARISON:  DG CHEST 1V PORT  dated 01/24/2014  FINDINGS: The endotracheal tube tip lies approximately 1.1 cm above the crotch of the carina. The lungs are reasonably well inflated. The interstitial markings are increased diffusely but have improved somewhat since the earlier study. The cardiopericardial silhouette is mildly enlarged and the left hemidiaphragm remains obscured. An esophagogastric tube tip projects below the inferior margin of the film. A left internal jugular venous catheter tip lies in the midportion of the SVC.  IMPRESSION: 1. The endotracheal tube tip lies approximately 1.1 cm above the crotch of the carina and withdrawal by 3-4 cm is recommended. 2. The esophagogastric tube and left internal jugular venous catheter appear to be in appropriate position. 3. There are persistently increased interstitial markings bilaterally which have improved somewhat since the previous study. The cardiopericardial silhouette remains mildly enlarged and the central pulmonary vascularity remains prominent though it has improved since yesterday's study. 4. These results were called by telephone at the time of interpretation on 01/24/2014 at 2:26 PM to [facility], who verbally acknowledged these results.   Electronically Signed   By: David  Martinique   On: 01/24/2014 14:28   Dg Swallowing Func-speech Pathology  02/04/2014   Earle Gell McCoy, CCC-SLP     02/04/2014  3:23 PM Objective Swallowing Evaluation: Modified Barium Swallowing Study   Patient Details  Name: SIMMIE BALTHAZOR MRN: [id number] Date of Birth: [birth year]  Today's Date: 02/04/2014 Time: 1440-1500 SLP Time Calculation (min): 20 min  Past Medical History:  Past Medical History  Diagnosis Date  . Anxiety   . Arthritis   . COPD (chronic obstructive pulmonary disease)   . GERD (gastroesophageal reflux disease)   . Hyperlipidemia   . Osteoporosis   . DVT (deep venous thrombosis)   . PE (pulmonary thromboembolism)   . Hypertension      PT DENIES....ON NO  MEDS  . Diabetes mellitus   .  Cancer 1969    cervical  . Pneumonia     h/o  . Bronchitis     h/o  . Blood transfusion   . Anemia   . Bipolar affect, depressed    Past Surgical History:  Past Surgical History  Procedure Laterality Date  . Vena  cava filter placement    . 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 Eye surgery      CATARACTS  . Achilles tendon lengthening  11/18/11    and repair w/posterior tibial tendon lengthening; right  foot  . Cholecystectomy    . Abdominal hysterectomy  1969    "womb taken out for cervical cancer"  . Cataract extraction, bilateral  ~ 2008  . Tonsillectomy      "as a child"  . Achilles tendon surgery  11/18/2011    Procedure: ACHILLES TENDON REPAIR;  Surgeon: Wylene Simmer, MD;   Location: [place];  Service: Orthopedics;  Laterality: Right;   Right Posterior Tibial Tendon Lenghtening and Tendon Achilles  Lenghtening    HPI:  73 y/o resident of [facility] found by staff with fever and  hypoxia. Taken to ED on NRB 100% with improvement. PCCM asked to  see patient for respiratory needs and hypotension refractory to  IVF. Recent admission to hospital 12/25/13 for ESBL E.  Coli>>Urosepsis.     Assessment / Plan / Recommendation Clinical Impression  Dysphagia Diagnosis: Mild pharyngeal phase dysphagia Clinical impression: Patient presents with a mild pharyngeal  phase dysphagia characterized by a delay in swallow initiation  resulting in deep silent penetration of thin liquids. SLP  provided verbal and visual instruction for chin tuck which did  not prevent penetration however cues for immediate throat clear  successful to clear vestibule and prevent aspiration. Patient  receptive to carryout of this strategy. Education provided with  daughter regarding results of test, recommendations for diet and  precautions, and prognosis for improvement. Although patient with  h/o dysphagia with silent aspiration, current difficulty may  additionally be attributed to or exacerbated by recent prolonged  dysphagia and has the potential  to improve. SLP will f/u at  bedside for treatment.     Treatment Recommendation  Therapy as outlined in treatment plan below    Diet Recommendation Dysphagia 3 (Mechanical Soft);Thin liquid   Liquid Administration via: Cup;No straw Medication Administration: Whole meds with puree Supervision: Patient able to self feed;Full supervision/cueing  for compensatory strategies Compensations: Slow rate;Small sips/bites;Clear throat after each  swallow Postural Changes and/or Swallow Maneuvers: Seated upright 90  degrees    Other  Recommendations Oral Care Recommendations: Oral care BID   Follow Up Recommendations  Skilled Nursing facility    Frequency and Duration min 2x/week  2 weeks           General HPI: 73 y/o resident of [facility] found by staff with  fever and hypoxia. Taken to ED on NRB 100% with improvement. PCCM  asked to see patient for respiratory needs and hypotension  refractory to IVF. Recent admission to hospital 12/25/13 for ESBL  E. Coli>>Urosepsis. Type of Study: Modified Barium Swallowing Study Reason for Referral: Objectively evaluate swallowing function Previous Swallow Assessment: MBS in 2013 indicated silent  aspiration Diet Prior to this Study: Dysphagia 1 (puree);Nectar-thick  liquids Temperature Spikes Noted: No Respiratory Status: Nasal cannula History of Recent Intubation: Yes Length of Intubations (days): 10 days Date extubated: 02/01/14 Behavior/Cognition: Alert;Cooperative Oral Cavity - Dentition: Edentulous Oral Motor / Sensory Function: Within functional limits Self-Feeding Abilities: Able to feed self Patient Positioning: Upright in chair Baseline Vocal Quality: Clear Volitional Cough: Strong Volitional Swallow: Unable to elicit Anatomy: Within functional limits Pharyngeal Secretions: Not observed secondary MBS    Reason for Referral Objectively evaluate swallowing function   Oral Phase Oral Preparation/Oral  Phase Oral Phase: WFL   Pharyngeal Phase Pharyngeal Phase Pharyngeal Phase:  Impaired Pharyngeal - Thin Pharyngeal - Thin Cup: Delayed swallow initiation;Premature  spillage to valleculae;Premature spillage to pyriform  sinuses;Penetration/Aspiration before swallow Penetration/Aspiration details (thin cup): Material enters  airway, CONTACTS cords and not ejected out Pharyngeal - Solids Pharyngeal - Puree: Delayed swallow initiation;Premature spillage  to valleculae;Pharyngeal residue - valleculae Pharyngeal - Mechanical Soft: Delayed swallow  initiation;Premature spillage to valleculae;Pharyngeal residue -  valleculae Pharyngeal - Pill: Within functional limits (provided whole in  puree)  Cervical Esophageal Phase    GO    Cervical Esophageal Phase Cervical Esophageal Phase: [facility]        Gabriel Rainwater MA, CCC-SLP [phone]  McCoy Leah Meryl 02/04/2014, 3:22 PM    Microbiology: Recent Results (from the past 240 hour(s))  CLOSTRIDIUM DIFFICILE BY PCR     Status: None   Collection Time    01/31/14  1:15 AM      Result Value Ref Range Status   C difficile by pcr NEGATIVE  NEGATIVE Final   Comment: Performed at [facility]: Basic Metabolic Panel:  Recent Labs Lab 01/31/14 0540 02/01/14 0540 02/02/14 0530 02/03/14 0358 02/04/14 0411 02/05/14 0347  NA 141 141 140 138 138 140  K 4.2 4.0 4.3 4.1 3.6* 3.3*  CL 103 101 101 96 97 100  CO2 30 28 29  32 29 32  GLUCOSE 231* 198* 172* 224* 231* 215*  BUN 20 21 15 14 10 11   CREATININE 0.39* 0.35* 0.37* 0.39* 0.36* 0.40*  CALCIUM 8.9 8.9 9.2 9.1 9.0 8.4  MG 2.0 2.1  --  2.0 1.9 1.7  PHOS 3.7 3.8  --  4.3 2.7 2.7   CBC:  Recent Labs Lab 02/01/14 0540 02/02/14 0530 02/03/14 0358 02/04/14 0411 02/05/14 0347  WBC 7.5 9.0 8.1 8.2 7.1  NEUTROABS  --   --  5.1 4.7 3.4  HGB 8.6* 9.9* 9.8* 10.1* 9.5*  HCT 28.3* 31.4* 31.5* 31.9* 30.0*  MCV 92.2 90.8 90.3 90.6 91.2  PLT 356 448* 466* 464* 404*   BNP: BNP (last 3 results)  Recent Labs  12/30/13 1326 01/29/14 0350 02/03/14 0358  PROBNP 1309.0* 380.5*  975.6*   CBG:  Recent Labs Lab 02/04/14 0709 02/04/14 1204 02/04/14 1712 02/04/14 2348 02/05/14 0736  GLUCAP 213* 176* 238* 208* 191*   Signed:  Marzetta Board  [facility] 02/05/2014, 10:29 AM

## 2014-02-05 NOTE — Progress Notes (Signed)
Patient is set to discharge back to [facility] today. Patient is agreeable to return to [facility] rather than go to another SNF - patient's daughter also encouraged her to stay at [facility]. Discharge packet given to RN, Horris Latino. [facility] called for transport pickup.   Clinical Social Work Department CLINICAL SOCIAL WORK PLACEMENT NOTE 02/05/2014  Patient:  CAMAURI, FLEECE  Account Number:  [id number] Admit date:  01/21/2014  Clinical Social Worker:  Levie Heritage  Date/time:  02/03/2014 10:10 AM  Clinical Social Work is seeking post-discharge placement for this patient at the following level of care:   SKILLED NURSING   (*CSW will update this form in Epic as items are completed)   02/03/2014  Patient/family provided with [place] Department of Clinical Social Work's list of facilities offering this level of care within the geographic area requested by the patient (or if unable, by the patient's family).  02/03/2014  Patient/family informed of their freedom to choose among providers that offer the needed level of care, that participate in Medicare, Medicaid or managed care program needed by the patient, have an available bed and are willing to accept the patient.  02/03/2014  Patient/family informed of [facility]' ownership interest in [facility], as well as of the fact that they are under no obligation to receive care at this facility.  PASARR submitted to EDS on 02/05/2014 PASARR number received from EDS on 02/05/2014  FL2 transmitted to all facilities in geographic area requested by pt/family on  02/03/2014 FL2 transmitted to all facilities within larger geographic area on   Patient informed that his/her managed care company has contracts with or will negotiate with  certain facilities, including the following:     Patient/family informed of bed offers received:  02/05/2014 Patient chooses bed at [facility] Physician recommends and  patient chooses bed at    Patient to be transferred to [place] on  02/05/2014 Patient to be transferred to facility by [facility]  The following physician request were entered in Epic:   Additional Comments:   Raynaldo Opitz, [place] Worker cell #: [phone]

## 2014-02-05 NOTE — Progress Notes (Signed)
Speech Language Pathology Treatment: Dysphagia  Patient Details Name: [name] MRN: [id number] DOB: 28-Feb-1941 Today's Date: 02/05/2014 Time: 1140-1200 SLP Time Calculation (min): 20 min  Assessment / Plan / Recommendation Clinical Impression  Pt upright in chair, eating lunch.  No overt s/s aspiration observed or reported.  Safe swallow precautions posted at [facility].  Reviewed MBS results and precautions with pt.  Pt verbalized awareness of needing to clear throat after swallow, but required cues to follow through.  Pt appears to be tolerating current diet of dys 3/thin liquids.     HPI HPI: 73 y/o resident of [facility] found by staff with fever and hypoxia. Taken to ED on NRB 100% with improvement. PCCM asked to see patient for respiratory needs and hypotension refractory to IVF. Recent admission to hospital 12/25/13 for ESBL E. Coli>>Urosepsis.  MBS completed 02/04/14.   Pertinent Vitals VSS  SLP Plan  Continue with current plan of care    Recommendations Diet recommendations: Dysphagia 3 (mechanical soft);Thin liquid Liquids provided via: Cup;No straw Medication Administration: Whole meds with puree Supervision: Patient able to self feed;Full supervision/cueing for compensatory strategies Compensations: Slow rate;Small sips/bites;Clear throat after each swallow Postural Changes and/or Swallow Maneuvers: Seated upright 90 degrees              Oral Care Recommendations: Oral care BID Follow up Recommendations: Skilled Nursing facility Plan: Continue with current plan of care    [place] B. Quentin Ore [facility], CCC-SLP [phone]  [name] 02/05/2014, 12:42 PM

## 2014-02-05 NOTE — Progress Notes (Signed)
Per MD order, central line removed. IV cathter intact. Vaseline pressure gauze to site, pressure held x 5 min, no bleeding to site. Pt instructed not to get out of bed for 30 min after the removal of the central line. Instucted to keep dressing CDI x 24hours, if bleeding occurs hold pressure, if bleeding does not stop contact MD or go to the ED. Pt verbalized understanding and did not have any questions. Sheilia Reznick M  

## 2014-02-06 ENCOUNTER — Other Ambulatory Visit: Payer: Self-pay | Admitting: *Deleted

## 2014-02-06 MED ORDER — ALPRAZOLAM 1 MG PO TABS: ORAL_TABLET | ORAL | Status: DC

## 2014-02-06 MED ORDER — HYDROCODONE-ACETAMINOPHEN 5-325 MG PO TABS: 1.0000 | ORAL_TABLET | Freq: Four times a day (QID) | ORAL | Status: DC | PRN

## 2014-02-06 MED ORDER — OXYCODONE HCL ER 10 MG PO T12A: EXTENDED_RELEASE_TABLET | ORAL | Status: DC

## 2014-02-06 NOTE — Telephone Encounter (Signed)
[facility] 

## 2014-02-10 ENCOUNTER — Non-Acute Institutional Stay (SKILLED_NURSING_FACILITY): Payer: Medicare Other | Admitting: Internal Medicine

## 2014-02-10 DIAGNOSIS — G2401 Drug induced subacute dyskinesia: Secondary | ICD-10-CM

## 2014-02-10 NOTE — Progress Notes (Signed)
Patient ID: [name], female   DOB: 1941-09-15, 73 y.o.   MRN: [id number] Facility; [facility] Chief complaint; readmission to the facility post to stay at [facility] from 2/2 through 2/17 History; Mrs. Pherigo is a long-standing resident of this facility. She has known COPD on chronic oxygen, history of an IVC filter, pulmonary nodules; she apparently became acutely old with increasing shortness of breath and respiratory distress and was at the hospital. She required intubation and was cared for in the intensive care unit. She was discovered to have an ESBL Escherichia coli in her urine. Blood cultures were negative. She was eventually exit extubated and became more stable. Of note she had a stool for C. difficile PCR before she left the hospital however she is constipated urine has already received MiraLax therefore I am doubtful this is an issue.  His return to the facility and appears much at her baseline. She states she is generally weaker has episodic shortness of breath. She is coughing but not productive  Past Medical History  Diagnosis Date  . Anxiety   . Arthritis   . COPD (chronic obstructive pulmonary disease)   . GERD (gastroesophageal reflux disease)   . Hyperlipidemia   . Osteoporosis   . DVT (deep venous thrombosis)   . PE (pulmonary thromboembolism)   . Hypertension      PT DENIES....ON NO  MEDS  . Diabetes mellitus   . Cancer 1969    cervical  . Pneumonia     h/o  . Bronchitis     h/o  . Blood transfusion   . Anemia   . Bipolar affect, depressed    Past Surgical History  Procedure Laterality Date  . Vena cava filter placement    . 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 Eye surgery      CATARACTS  . Achilles tendon lengthening  11/18/11    and repair w/posterior tibial tendon lengthening; right  foot  . Cholecystectomy    . Abdominal hysterectomy  1969    "womb taken out for cervical cancer"  . Cataract extraction, bilateral  ~ 2008  .  Tonsillectomy      "as a child"  . Achilles tendon surgery  11/18/2011    Procedure: ACHILLES TENDON REPAIR;  Surgeon: Wylene Simmer, MD;  Location: [place];  Service: Orthopedics;  Laterality: Right;  Right Posterior Tibial Tendon Lenghtening and Tendon Achilles Lenghtening    Current Outpatient Prescriptions on File Prior to Visit  Medication Sig Dispense Refill  . acetaminophen (TYLENOL) 325 MG tablet Take 650 mg by mouth every 4 (four) hours as needed for mild pain.      Marland Kitchen albuterol (PROVENTIL) (2.5 MG/3ML) 0.083% nebulizer solution Take 3 mLs (2.5 mg total) by nebulization every 2 (two) hours as needed for wheezing or shortness of breath.  75 mL  12  . ALPRAZolam (XANAX) 1 MG tablet Take one tablet by mouth four times daily  120 tablet  5  . antiseptic oral rinse (BIOTENE) LIQD 15 mLs by Mouth Rinse route every 4 (four) hours as needed for dry mouth.      . Artificial Tear Ointment (REFRESH LACRI-LUBE) OINT Place 1 application into both eyes at bedtime.      Marland Kitchen aspirin 81 MG chewable tablet Chew 81 mg by mouth daily.      . bisacodyl (DULCOLAX) 10 MG suppository Place 10 mg rectally 3 (three) times a week. On Monday, Wednesday, and  Friday      . Calcium & Magnesium Carbonates (MYLANTA PO) Take 30 mLs by mouth every 4 (four) hours as needed (indigestion).      . feeding supplement, GLUCERNA SHAKE, (GLUCERNA SHAKE) LIQD Take 237 mLs by mouth 3 (three) times daily between meals.    0  . fish oil-omega-3 fatty acids 1000 MG capsule Take 1 g by mouth 2 (two) times daily.       . Fluticasone-Salmeterol (ADVAIR DISKUS) 500-50 MCG/DOSE AEPB Inhale 1 puff into the lungs 2 (two) times daily.  60 each  11  . gabapentin (NEURONTIN) 300 MG capsule Take 3 capsules (900 mg total) by mouth 3 (three) times daily.      Marland Kitchen HYDROcodone-acetaminophen (NORCO/VICODIN) 5-325 MG per tablet Take 1 tablet by mouth every 6 (six) hours as needed for moderate pain.  120 tablet  0  . insulin aspart (NOVOLOG) 100 UNIT/ML FlexPen  Inject 3-5 Units into the skin 3 (three) times daily before meals. Give 3 units before meals with an additional 5 units for CBG> or < 150      . insulin glargine (LANTUS) 100 UNIT/ML injection Inject 0.22 mLs (22 Units total) into the skin daily.  10 mL  11  . levalbuterol (XOPENEX HFA) 45 MCG/ACT inhaler Inhale 2 puffs into the lungs every 4 (four) hours as needed for wheezing.      . levalbuterol (XOPENEX) 1.25 MG/0.5ML nebulizer solution Take 1.25 mg by nebulization 4 (four) times daily.  1 each  12  . Magnesium Hydroxide (MILK OF MAGNESIA PO) Take 30 mLs by mouth See admin instructions. If no BM in 3 days, give 34ml in a 24 hours PRN.      . metFORMIN (GLUCOPHAGE) 1000 MG tablet Take 1,000 mg by mouth daily before supper.      . metoprolol tartrate (LOPRESSOR) 25 MG tablet Take 1 tablet (25 mg total) by mouth 2 (two) times daily.      Marland Kitchen nystatin (MYCOSTATIN) 100000 UNIT/ML suspension Take 5 mLs (500,000 Units total) by mouth 4 (four) times daily.  60 mL  0  . omeprazole (PRILOSEC OTC) 20 MG tablet Take 20 mg by mouth 2 (two) times daily.      . OxyCODONE (OXYCONTIN) 10 mg T12A 12 hr tablet Take one tablet by mouth every 12 hours. Do not crush  60 tablet  0  . polyethylene glycol (MIRALAX / GLYCOLAX) packet Take 17 g by mouth daily.        . potassium chloride (K-DUR,KLOR-CON) 10 MEQ tablet Take 5 mEq by mouth every evening.       . predniSONE (DELTASONE) 10 MG tablet Take 1 tablet (10 mg total) by mouth daily with breakfast. 2 additional days      . promethazine (PHENERGAN) 25 MG tablet Take 25 mg by mouth every 6 (six) hours as needed. nausea      . QUEtiapine (SEROQUEL) 200 MG tablet Take 200 mg by mouth at bedtime.      . simvastatin (ZOCOR) 10 MG tablet Take 10 mg by mouth at bedtime.      . sodium phosphate (FLEET) enema Place 1 enema rectally daily as needed (constipation). follow package directions      . tiotropium (SPIRIVA) 18 MCG inhalation capsule Place 1 capsule (18 mcg total) into  inhaler and inhale daily.  30 capsule  11  . torsemide (DEMADEX) 20 MG tablet Take 40 mg by mouth every morning.      . traZODone (DESYREL) 100  MG tablet Take 100 mg by mouth at bedtime.      . traZODone (DESYREL) 50 MG tablet Take 25 mg by mouth daily as needed (anxiety).       . venlafaxine XR (EFFEXOR-XR) 150 MG 24 hr capsule Take 150 mg by mouth every morning.        No current facility-administered medications on file prior to visit.    Social history; she is a long-standing resident of this facility now. She is a full code.  reports that she quit smoking about 27 years ago. Her smoking use included Cigarettes. She smoked 1.00 pack per day. She has never used smokeless tobacco. She reports that she does not drink alcohol or use illicit drugs.  Review of systems Respiratory; dry cough some shortness of breath Cardiac no chest pain GI no abnormal pain no diarrhea GU no dysuria  Physical examination respiratory rate 28 O2 sat 94% on 2 L pulse rate 98 Gen. patient appears right back to her baseline she is talking in full sentences somewhat Tachypneic. Obvious tardive dyskinesia Respiratory; respiratory rate is 28 she is some accessory muscle use poor she is talking in full sentences; there is decreased air entry over the left lower lobe right lung is clear with a few crackles Cardiac heart sounds somewhat tachycardic no gallops no elevation in jugular venous pressure and no signs of heart failure Abdomen no liver no spleen no tenderness no masses Extremities 2+ edema up into her thighs this is not unusual for her she has significant venous stasis and an IVC filter  Impression/plan #1 ESBL Escherichia coli successfully treated in the hospital #2 COPD on chronic oxygen. She is on a steroid/LABA. She is also on Spiriva and has when necessary beta agonists both inhaled and nebulizer. I wonder how well she is able to do with the inhalers. I am going to use routine nebulizers for now #3 right  upper quadrant pulmonary nodules with a followup CT scan of the chest recommended. I will this for 2 weeks from now #4 history of bipolar affective disorder this has been very problematic looking after her #5 significant tardive dyskinesia #6 hyperlipidemia on Zocor #7 history of pulmonary embolism with a long-standing IVC filter. Had an intra-abdominal hemorrhage during anticoagulation #8 type 2 diabetes on insulin and oral agents #9 lower extremity edema probably multifactorial; a lot of this is probably venous stasis and/or related to an IVC filter she is on Demadex 40 mg for now I would not push this any further

## 2014-02-13 ENCOUNTER — Other Ambulatory Visit (HOSPITAL_BASED_OUTPATIENT_CLINIC_OR_DEPARTMENT_OTHER): Payer: Self-pay | Admitting: Internal Medicine

## 2014-02-13 DIAGNOSIS — R911 Solitary pulmonary nodule: Secondary | ICD-10-CM

## 2014-02-20 ENCOUNTER — Encounter: Payer: Self-pay | Admitting: Internal Medicine

## 2014-02-20 ENCOUNTER — Non-Acute Institutional Stay (SKILLED_NURSING_FACILITY): Payer: Medicare Other | Admitting: Internal Medicine

## 2014-02-20 DIAGNOSIS — L0390 Cellulitis, unspecified: Principal | ICD-10-CM

## 2014-02-20 DIAGNOSIS — D649 Anemia, unspecified: Secondary | ICD-10-CM

## 2014-02-20 DIAGNOSIS — L0291 Cutaneous abscess, unspecified: Secondary | ICD-10-CM

## 2014-02-20 DIAGNOSIS — B9562 Methicillin resistant Staphylococcus aureus infection as the cause of diseases classified elsewhere: Secondary | ICD-10-CM | POA: Insufficient documentation

## 2014-02-20 DIAGNOSIS — R609 Edema, unspecified: Secondary | ICD-10-CM

## 2014-02-20 DIAGNOSIS — A4902 Methicillin resistant Staphylococcus aureus infection, unspecified site: Secondary | ICD-10-CM

## 2014-02-20 DIAGNOSIS — R911 Solitary pulmonary nodule: Secondary | ICD-10-CM

## 2014-02-20 NOTE — Progress Notes (Signed)
Patient ID: [name], female   DOB: 08-21-1941, 73 y.o.   MRN: [id number]   This is an acute visit.  Level of care skilled.  Facility -- [facility] complaint-acute visit followup hand wound.  History of present illness. Patient is a 73 year old female with a complex medical history including recent hospitalization for shortness of breath-she required intubation and intensive care unit stay- was discovered to have ESBL Escherichia coli blood cultures were negative.---Her UTI was successfully treated in the hospital-he also has a history of COPD on chronic oxygen apparently in the hospital there was a question of a right upper quadrant pulmonary nodule with suggestion for followup CT scan of the chest in 2 weeks apparently this has been ordered we will need to check on this.  She also has a significant history of bipolar affective disorder.  Apparently she sustained a wound to her  left hand recently apparently hit it on a door while ambulating in her wheelchair and there was a small amount of drainage wound care did culture this and we have received the results positive for MRSA.  The medical social history as been reviewed per progress note on the way to his second 80.  Medications have been reviewed per MAR.  Review of systems.  In general does not complaining of any fever or chills.  Breast phoresis her breathing is okay does not complain of cough and there some noncompliance with her nebulizers and breathing treatments.  ReACT is not complaining of chest pain.  GI no complaints of nausea vomiting diarrhea constipation.  Muscle skeletal complains at times of some hand pain.  Neurologic no complaints of headache or dizziness.  Skin-has a skin tear wound on left hand as noted above  Physical exam Temperature 98.2 pulse 90 respirations 20 blood pressure 122/69.  General this is a somewhat anxious elderly female in no distress sitting comfortably in her  wheelchair.  Her skin is warm and dry I do note base of the second digit on her left hand there is a small what appears to be skin tear there is a small amount of surrounding erythema certainly less than a centimeter and a minimal amount of drainage this does not really have any edema to it there is some mild tenderness to palpation  Chest there is reduced air entry however did not really note any significant congestion there is no labored breathing she talks in full sentences.  Heart is distant heart sounds regular rate and rhythm borderline tachycardic-her lower extremity edema appears to be fairly baseline apparently with previous exams ---t I would say1-2+ edema  Abdomen soapy soft nontender with positive bowel sounds.  Muscle skeletal-has what appears to be baseline edema with suspicions of venous stasis she does have a history ofIVC filter as well.  Psych she does appear somewhat anxious but is pleasant and accommodating during exam.       .  Labs.  02/18/2014.  WBC 5.3 hemoglobin 9.5 platelets 157  01/08/2014.  WBC 7.5 hemoglobin 10.9 platelets 349.  Sodium 139 potassium 4.6 BUN 11 creatinine 0.5.  Liver function tests within normal limits.   Assessment and plan.  1-left hand wound-apparently hit has grown out some MRSA-wound does not appear to be large to any extent however certainly growing MRSA and this will have to be treated aggressively-Will treat with doxycycline 100 mg twice a day for 10 days also wound care to monitor daily and treat with topical antibiotic twice a  day   2--as a history pulmonary nodules?-CT scan is pending I did discuss this with nursing staff and apparently this has been scheduled for next week respiratory-wise she appears to be stable although fragile on numerous medications includingLevalbuterol---and Spiriva and Advair as well as albuterol when necessary.  #3-3 lower extremity edema this up to be multifactoral she is on Demadex with  potassium Will update a metabolic panel to make sure electrolytes renal function stable  #4-anemia-per chart review it appears her hemoglobin has been stable with her mid February level hemoglobin had been as low as the high eights earlier in that month -- will recheck this next week  [id number] .  Marland Kitchen

## 2014-02-23 IMAGING — CR DG CHEST 1V PORT
1 series · 1 of 1 positions shown · non-contrast
Comparison: Earlier the same day

CLINICAL DATA: Left IJ central line placement

EXAM:
PORTABLE CHEST - 1 VIEW

[AP]
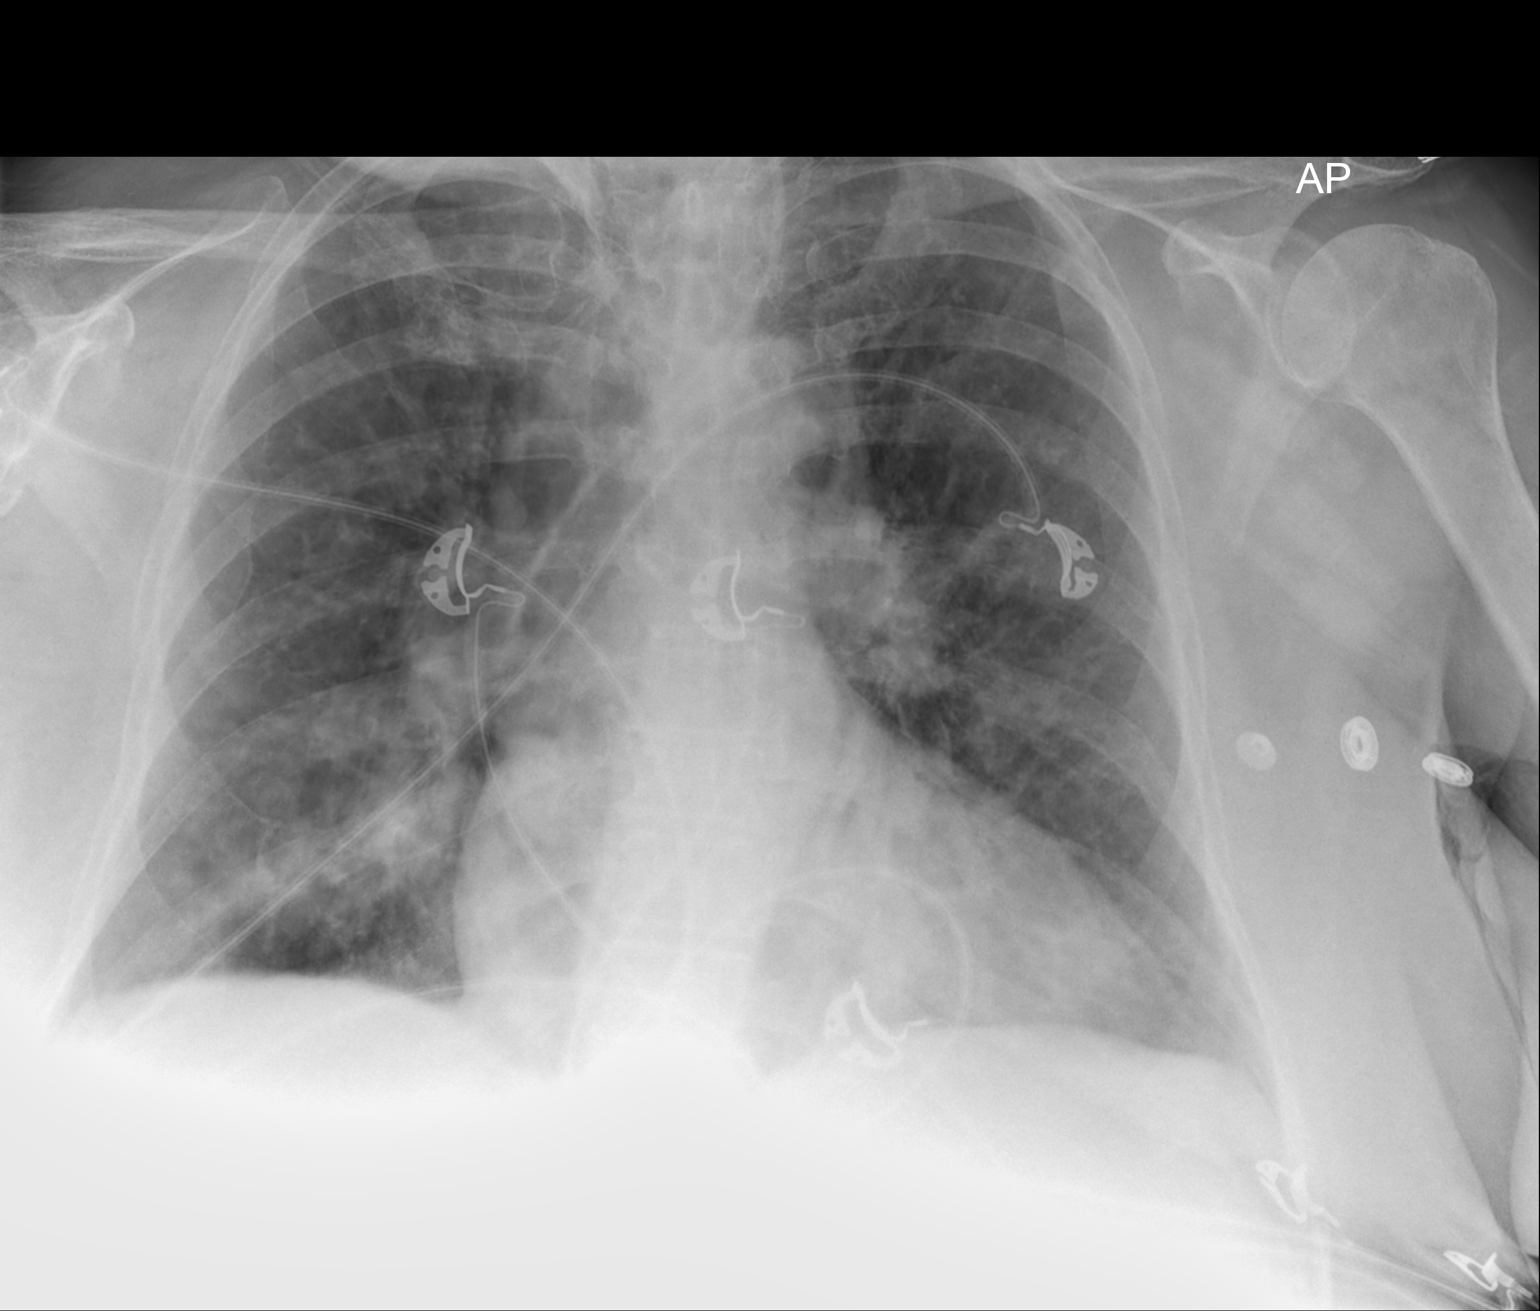

[1 of 1 positions shown; findings below may reference images not displayed]

FINDINGS: 8654 hrs. Left IJ central line is new in the interval. Catheter tip
projects at the level of the mid SVC. No evidence for left-sided
pneumothorax.

Diffuse interstitial and patchy bilateral airspace disease persists
with a basilar predominance of the airspace involvement.
Cardiopericardial silhouette is borderline increased and stable in
appearance. Telemetry leads overlie the chest.
IMPRESSION: The left IJ central line tip overlies the mid SVC.

## 2014-02-23 IMAGING — CR DG CHEST 1V PORT
1 series · 1 of 1 positions shown · non-contrast
Comparison: Plain film of the chest earlier this same day. CT chest
12/31/2012.

CLINICAL DATA: Respiratory distress.

EXAM:
PORTABLE CHEST - 1 VIEW

[AP]
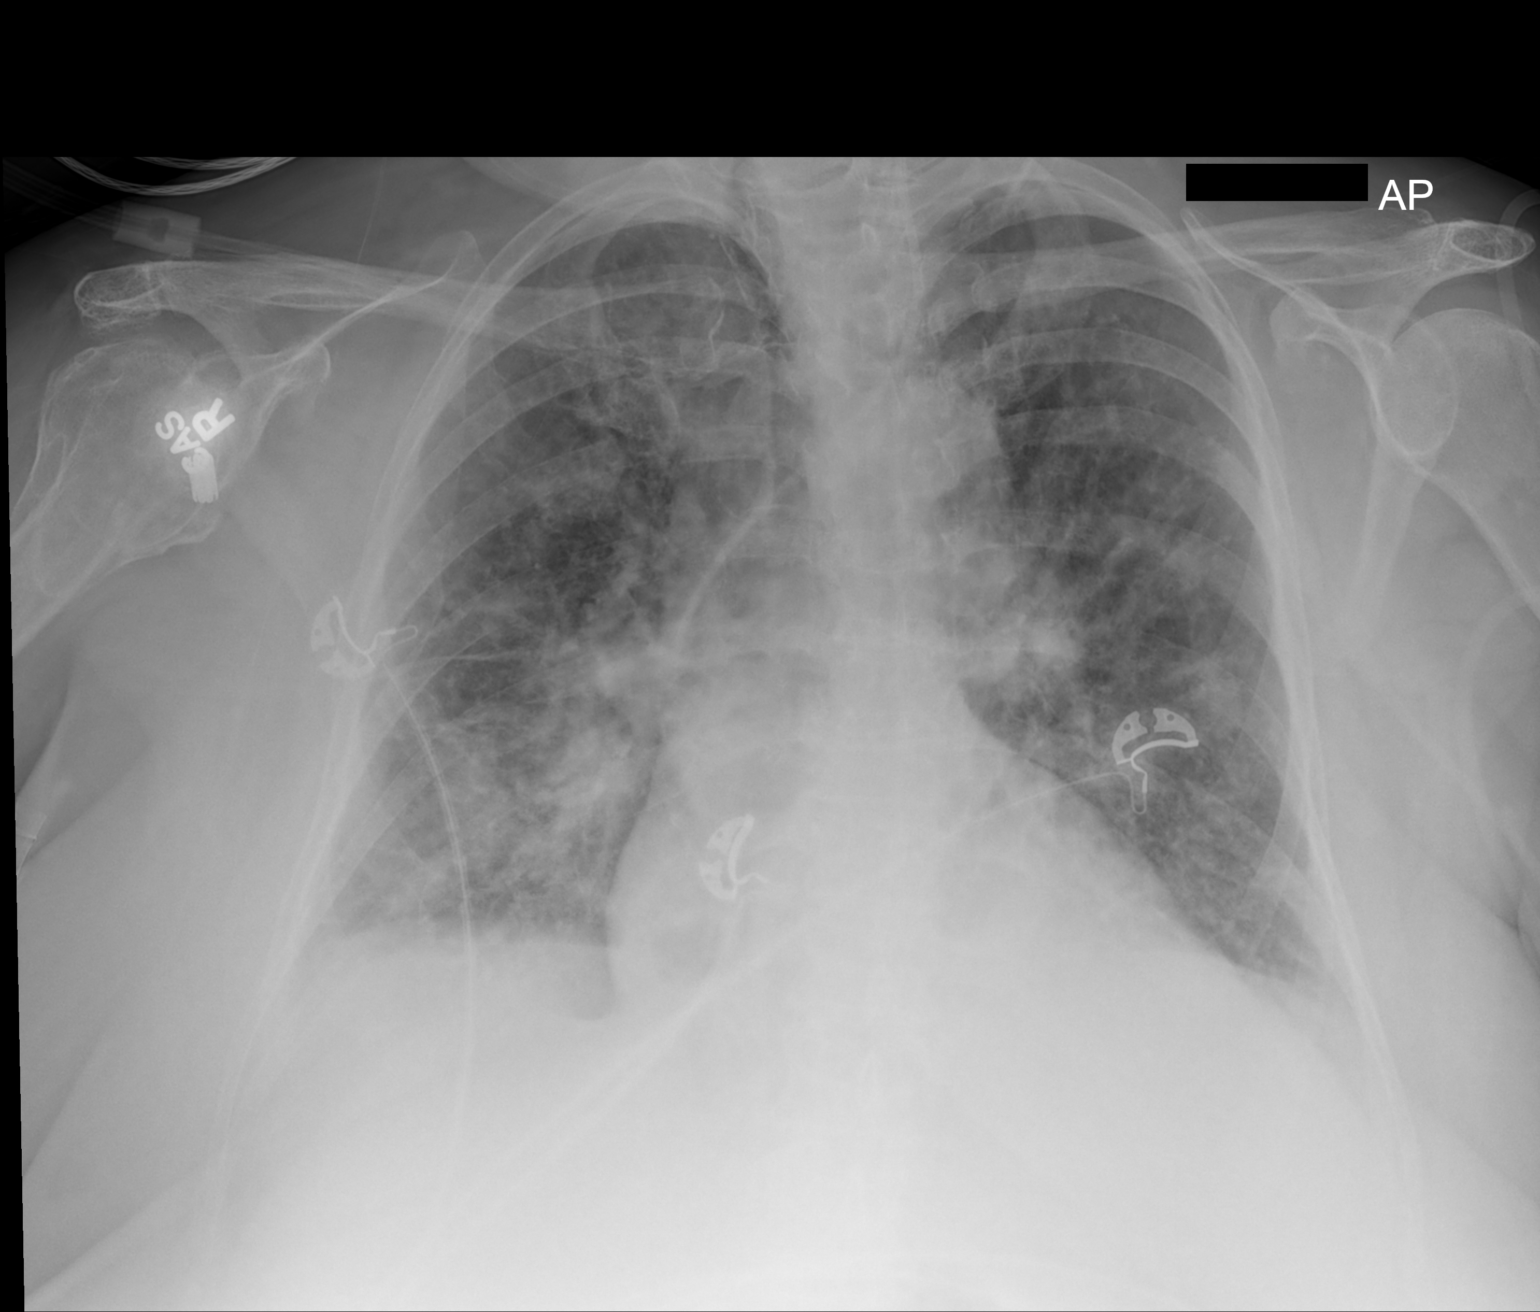

[1 of 1 positions shown; findings below may reference images not displayed]

FINDINGS: Left IJ central venous catheter is identified. Bilateral airspace
disease, worst in the right lung base, persists. Airspace opacity in
the left lung has increased. No pneumothorax identified. Heart size
is enlarged.
IMPRESSION: Increased bilateral airspace disease likely due to pulmonary edema.

## 2014-02-23 IMAGING — CR DG CHEST 1V PORT
1 series · 2 of 2 positions shown · non-contrast
Comparison: CT ANGIO CHEST W/CM &/OR WO/CM dated 12/31/2013; DG
CHEST 1V PORT dated 12/30/2013

CLINICAL DATA: Fever with shortness of breath.

EXAM:
PORTABLE CHEST - 1 VIEW

[Series 1: AP · U · 2 of 2 slices shown]
[im 1/2]
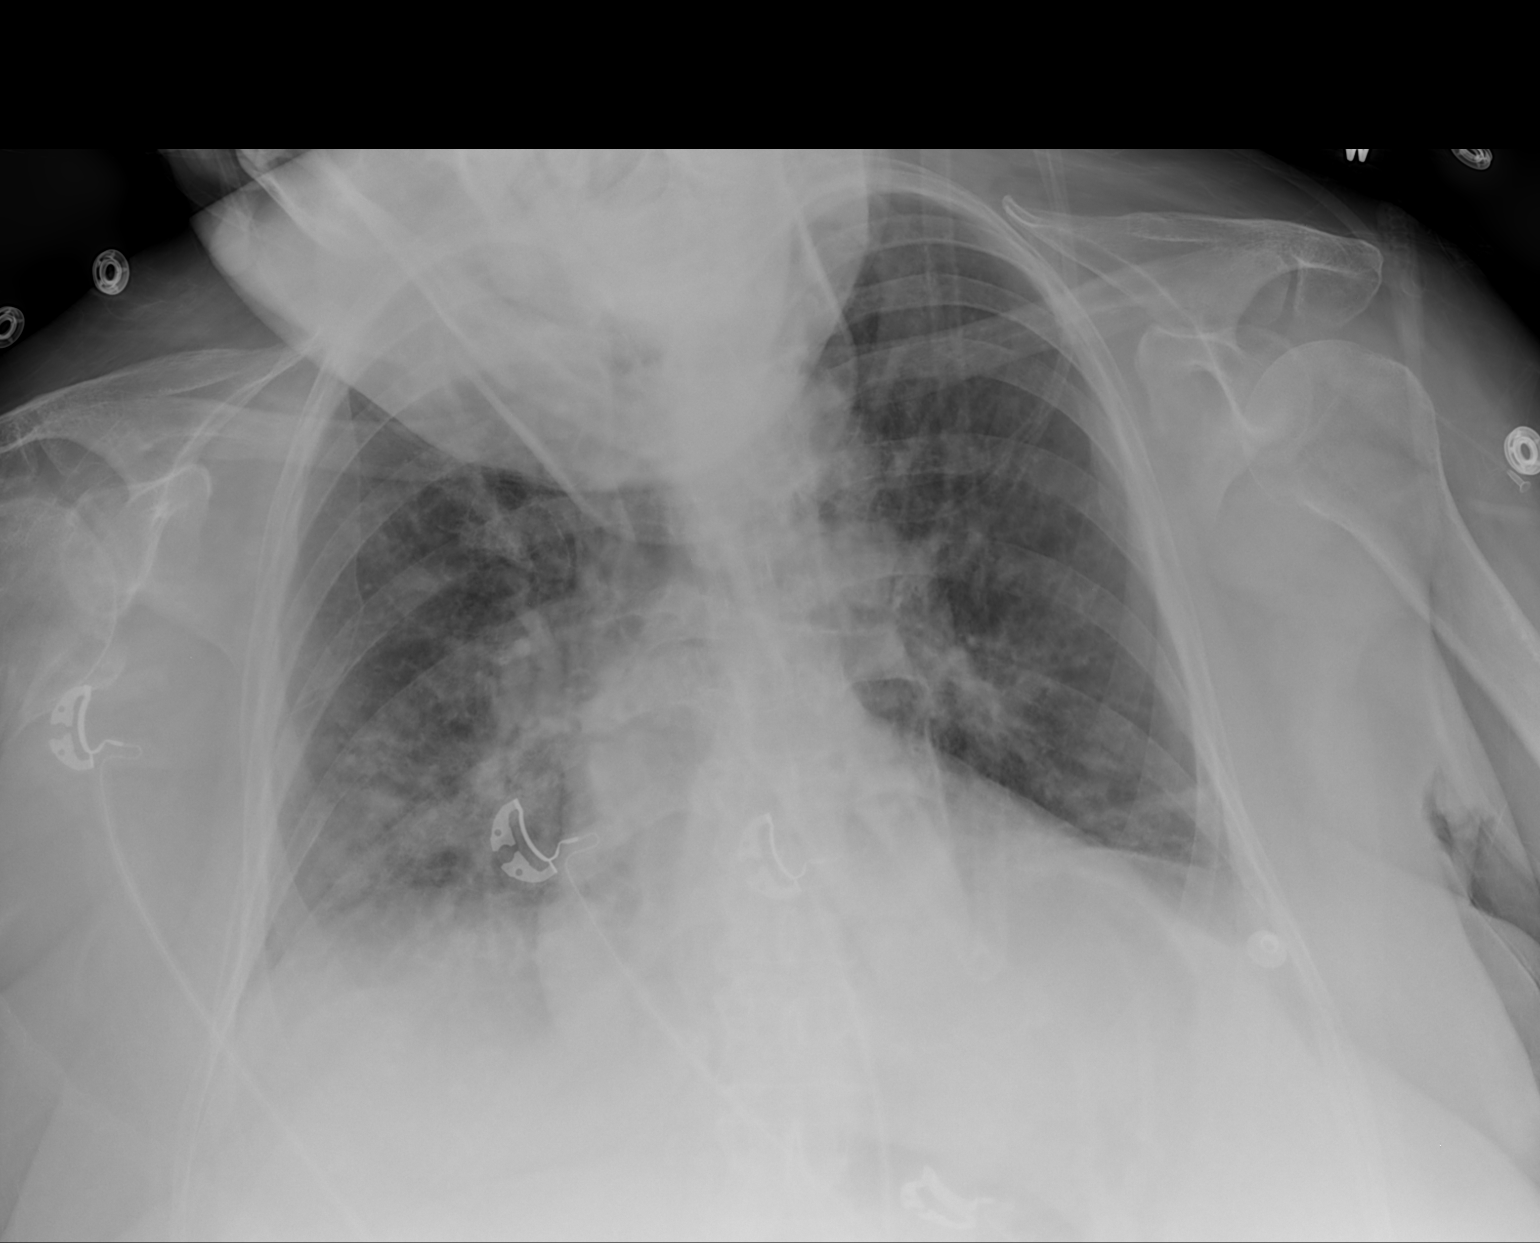
[im 2/2]
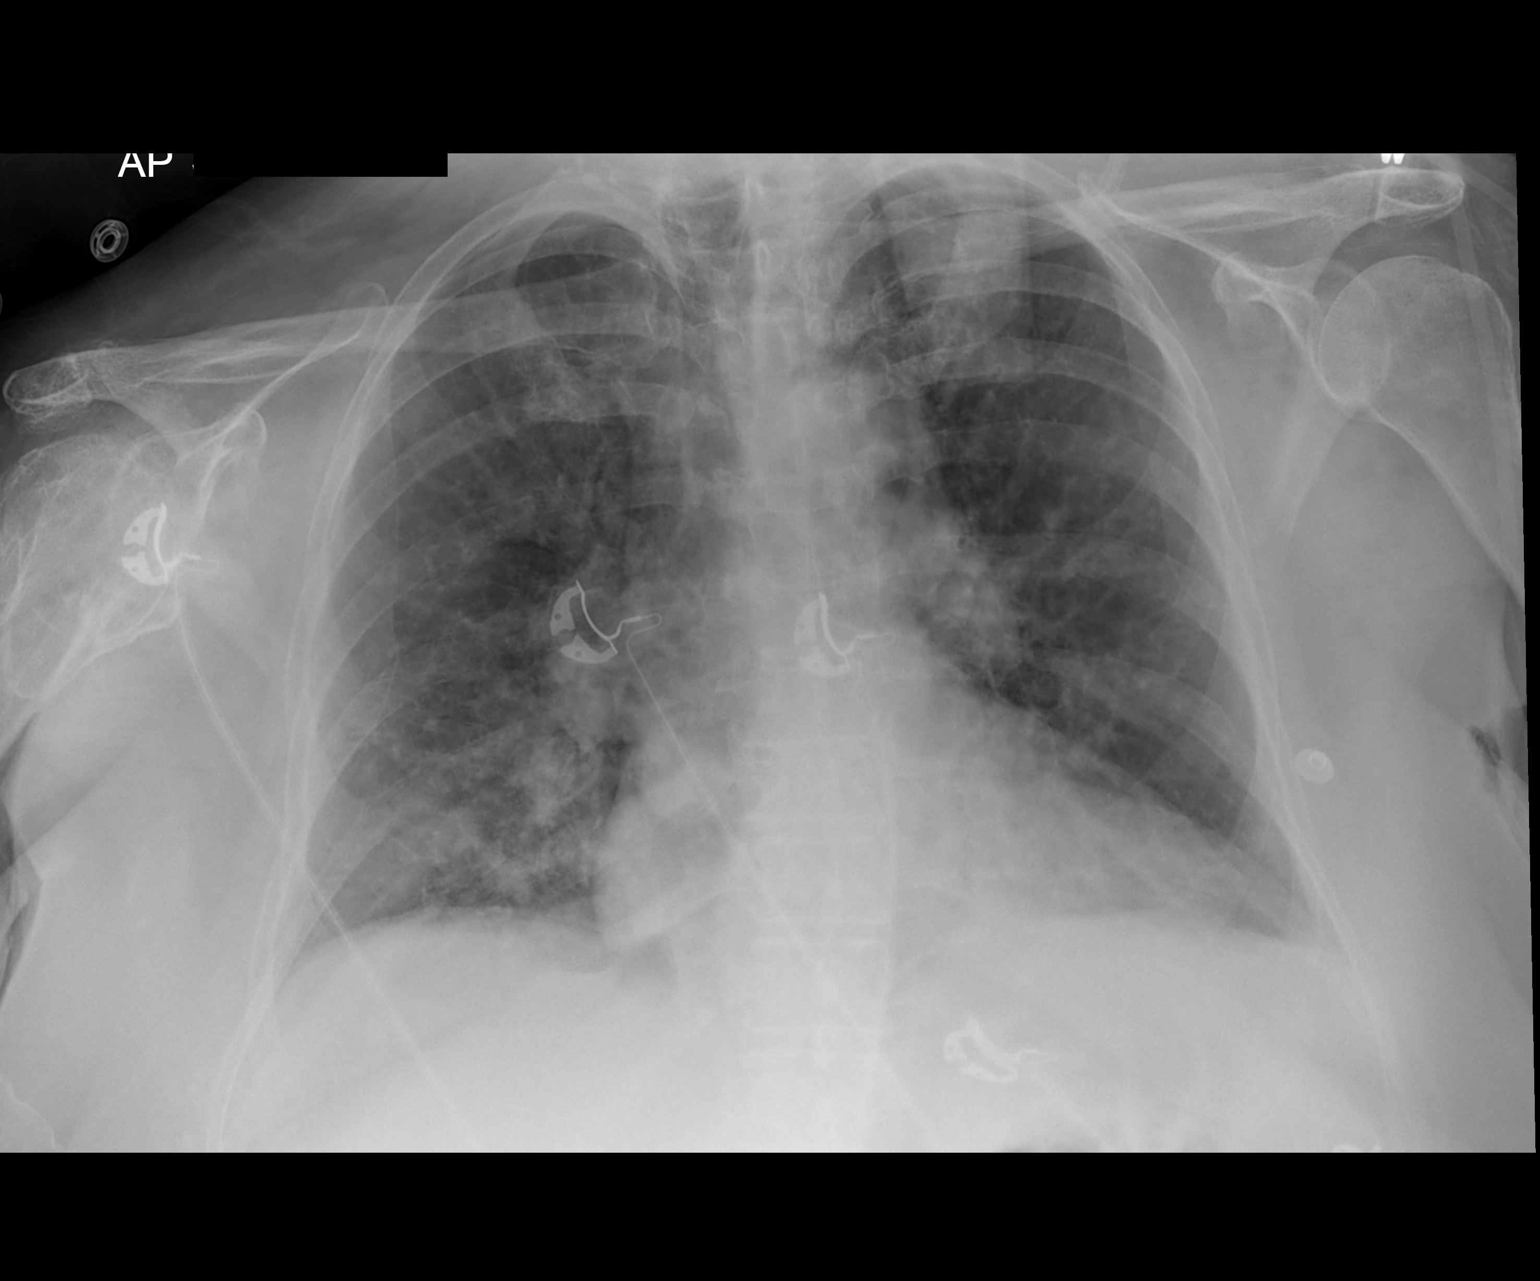

[2 of 2 positions shown; findings below may reference images not displayed]

FINDINGS: 5102 hr. Two views were obtained. There are lower lung volumes with
stable patchy bibasilar pulmonary opacities based on the second
view. No progressive airspace disease or significant pleural
effusion is identified. The heart size and mediastinal contours are
stable. Deformity of the proximal right humerus appears stable.
IMPRESSION: Stable patchy bibasilar airspace opacities compared with recent
prior studies. No new findings demonstrated.

## 2014-02-24 IMAGING — CR DG CHEST 1V PORT
1 series · 1 of 1 positions shown · non-contrast
Comparison: DG CHEST 1V PORT dated 01/21/2014;

CLINICAL DATA: Airspace disease.  Hospital acquired pneumonia.

EXAM:
PORTABLE CHEST - 1 VIEW

[AP]
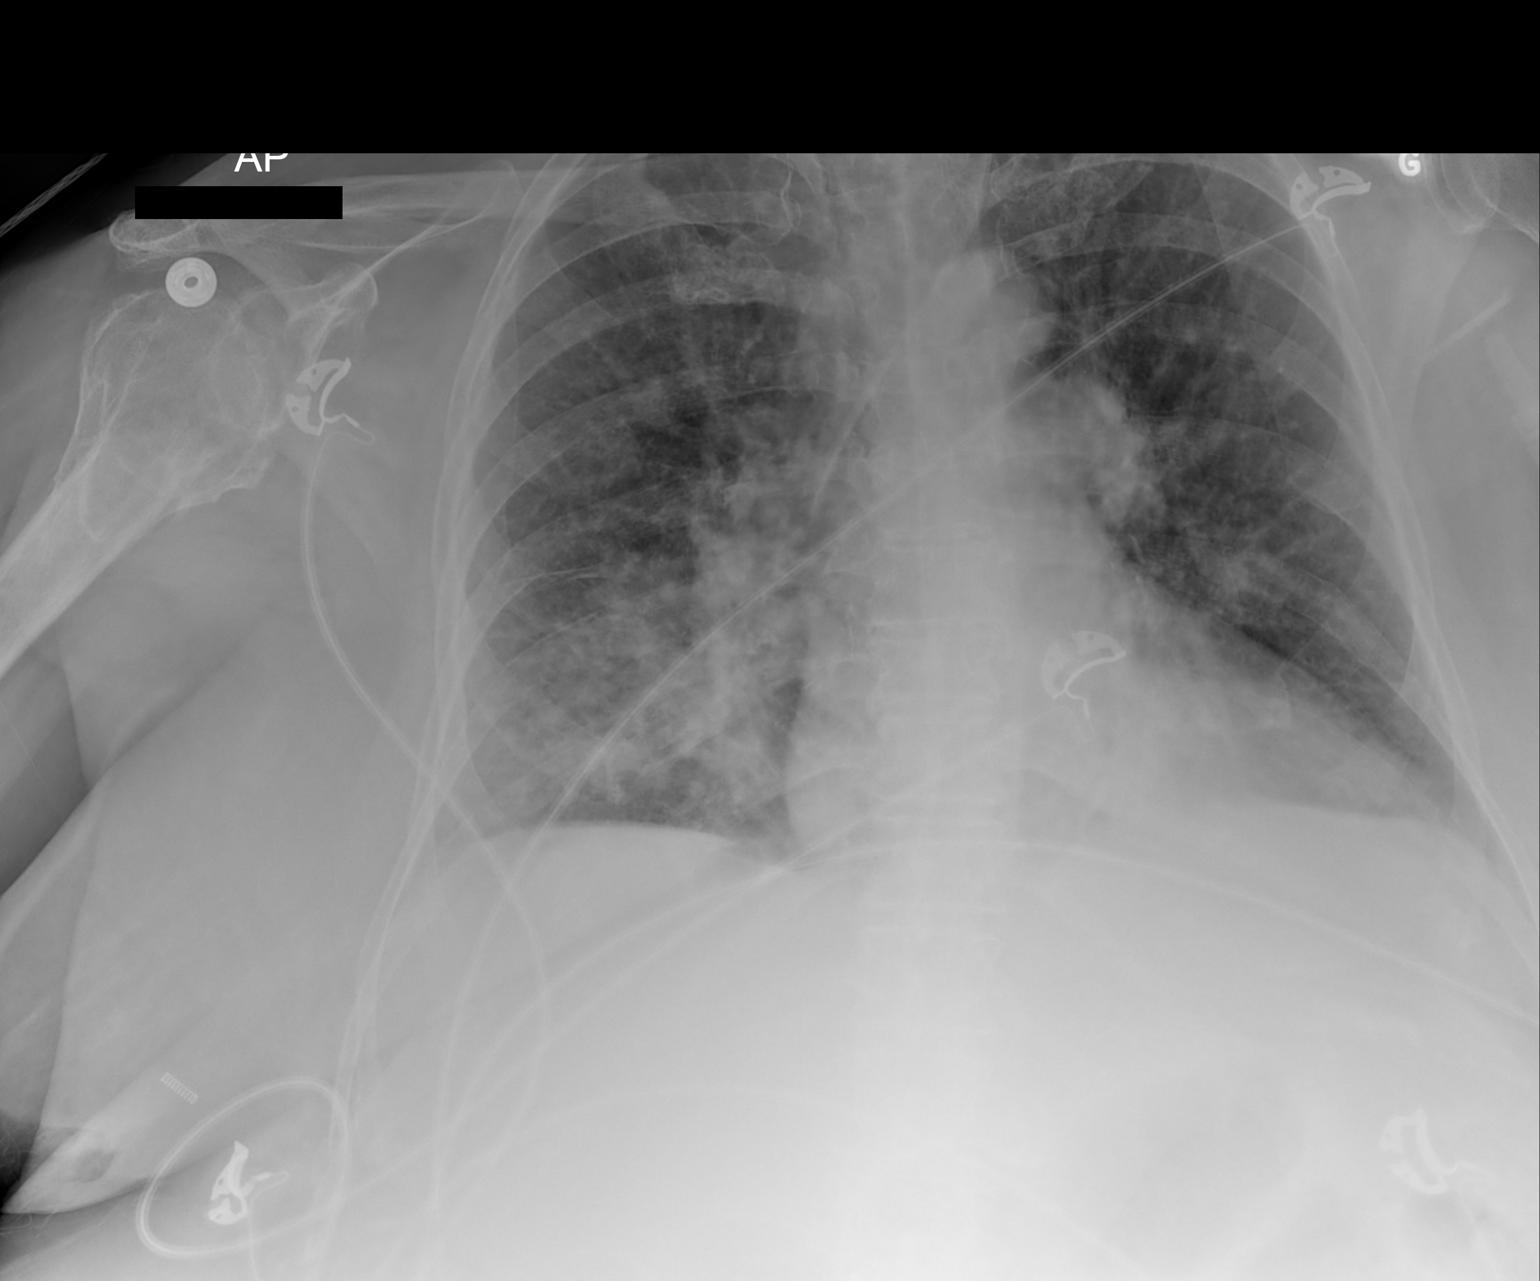

[1 of 1 positions shown; findings below may reference images not displayed]

DG CHEST 1V PORT dated
01/21/2014; DG CHEST 1V PORT dated 01/21/2014; DG CHEST 1V PORT dated
12/30/2013; CT ANGIO CHEST W/CM &/OR WO/CM dated 12/31/2013
FINDINGS: Left IJ central line is unchanged. Monitoring leads project over the
chest. Right basilar airspace disease is present in the right
infrahilar region, slightly more pronounced than on yesterday's
examination. Less pronounced airspace disease in the left lung
appears slightly improved compared to prior. Monitoring leads
project over the chest. Cardiopericardial silhouette unchanged. No
pneumothorax.
IMPRESSION: Slight worsening of airspace disease in the right infrahilar region,
probably representing fluctuating superimposed pulmonary edema. Mild
improvement in airspace disease in the left lung. Unchanged left IJ
central line with the tip in the mid SVC.

## 2014-02-25 IMAGING — CR DG CHEST 1V PORT
1 series · 2 of 2 positions shown · non-contrast
Comparison: Chest x-ray from yesterday

CLINICAL DATA: Followup pneumonia

EXAM:
PORTABLE CHEST - 1 VIEW

[Series 1: AP · U · 2 of 2 slices shown]
[im 1/2]
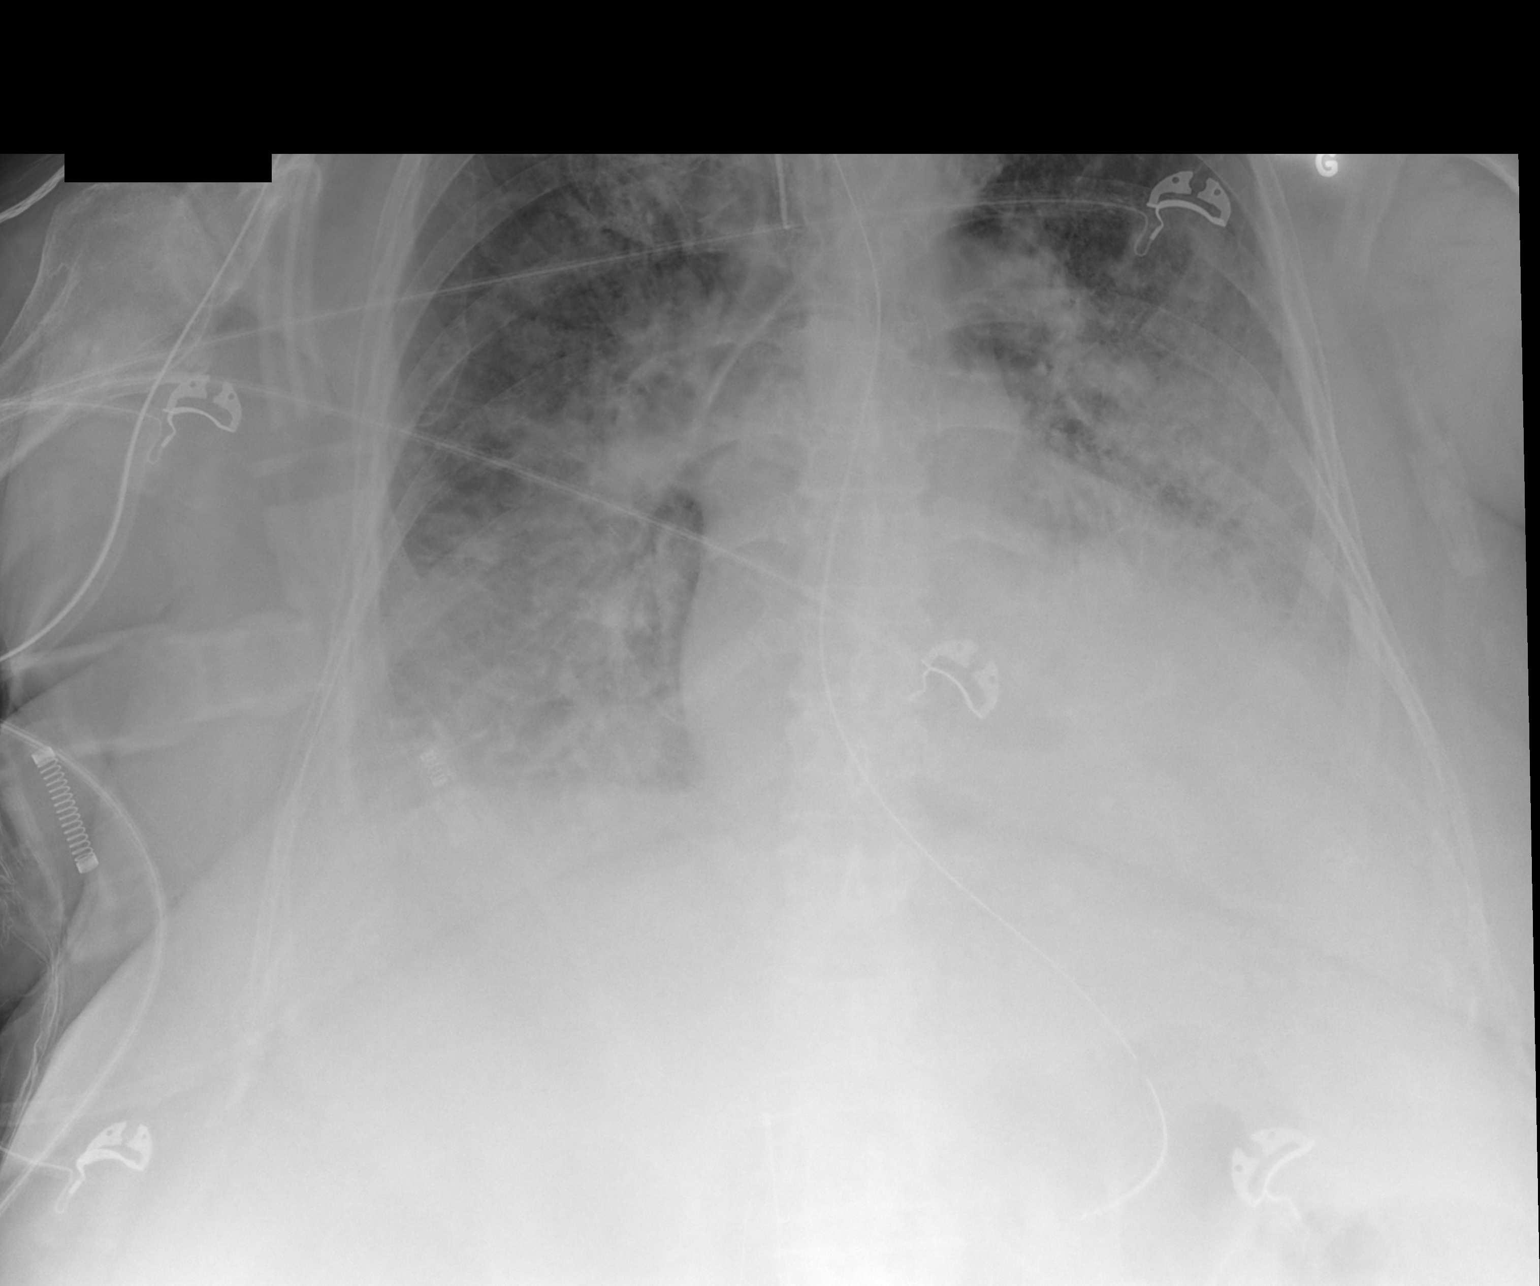
[im 2/2]
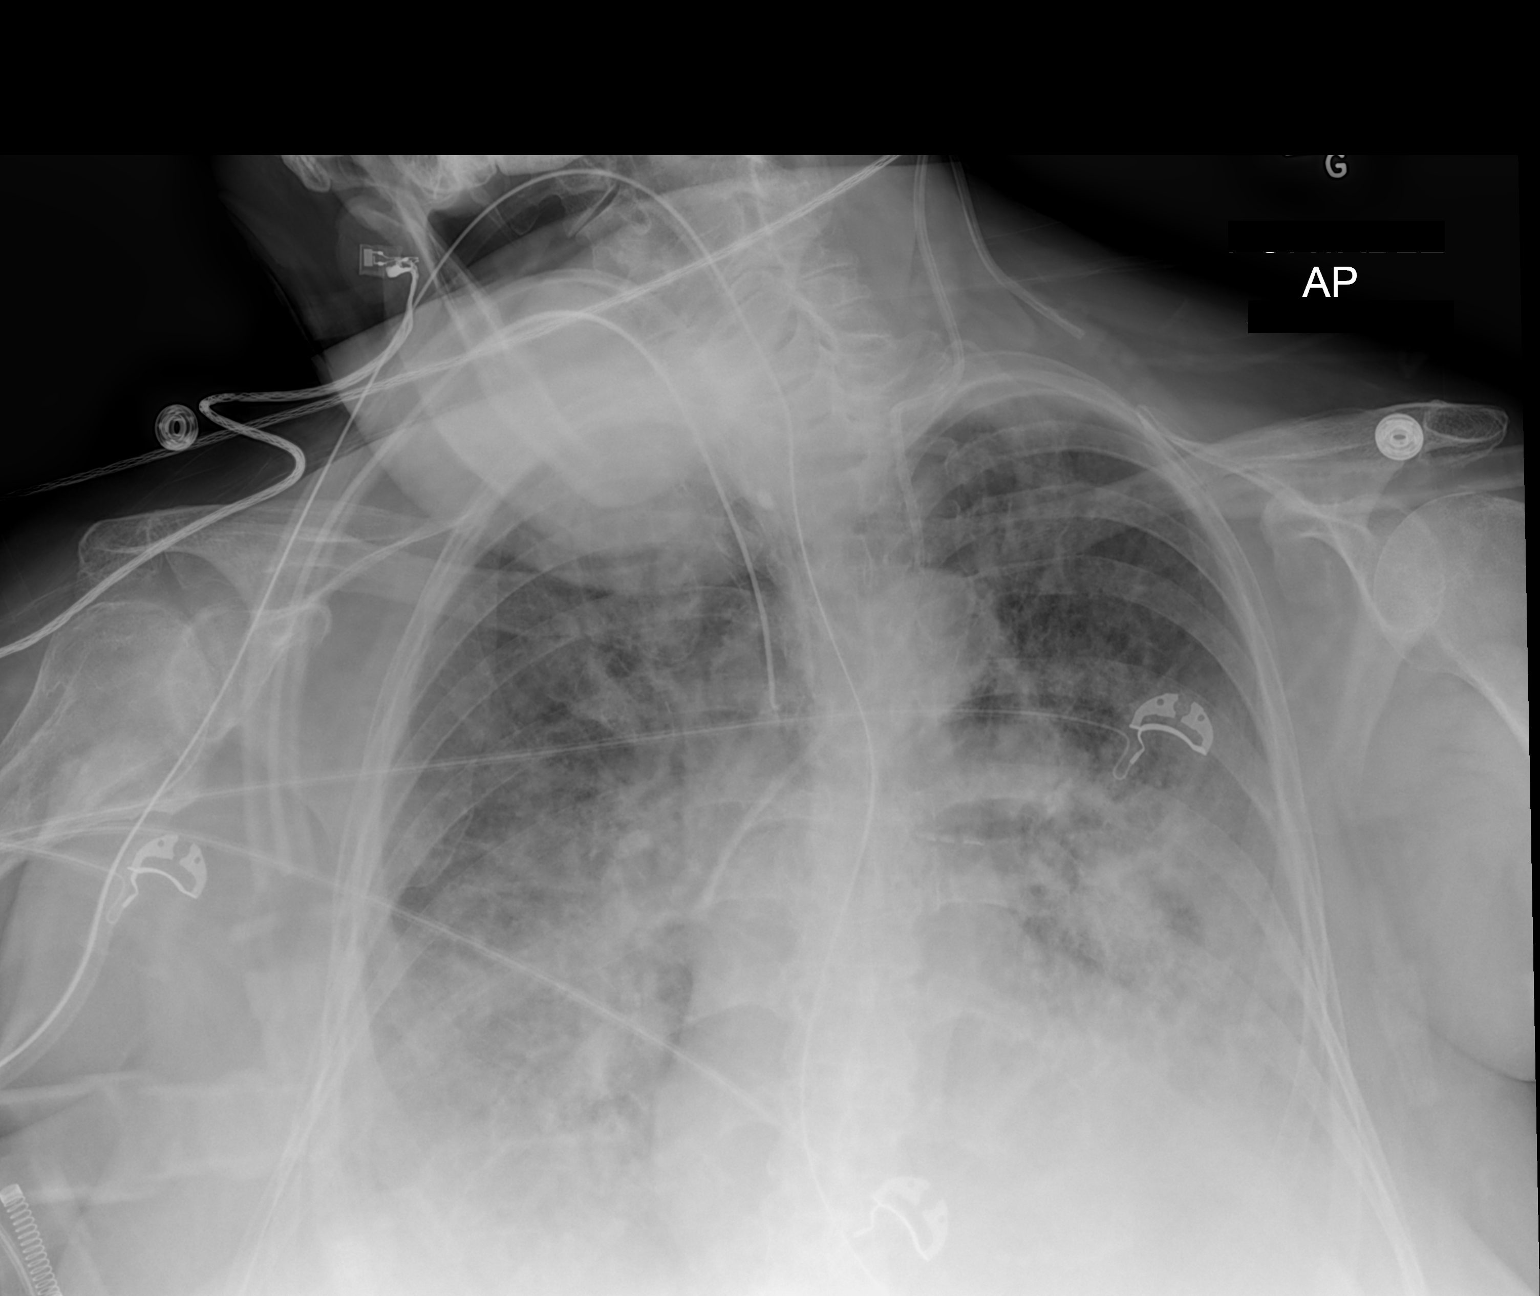

[2 of 2 positions shown; findings below may reference images not displayed]

FINDINGS: Endotracheal tube ends in the mid lower thoracic trachea, 2.3 cm
above the carina. New orogastric tube, tip in the proximal stomach.
IVC filter noted. Left IJ catheter in stable position.

Cardiomegaly which is stable from prior. Dense bibasilar opacities.
The left mid lung is more densely consolidated today. Interstitial
markings are diffusely coarsened, with cephalized blood flow. There
are probable small bilateral pleural effusions. No pneumothorax.
IMPRESSION: 1. More extensive bilateral lung consolidation.
2. Suspect edema superimposed on the patient's pneumonia.
3. Tubes and lines are in good position, including the new
orogastric tube.

## 2014-02-26 IMAGING — CR DG CHEST 1V PORT
1 series · 1 of 1 positions shown · non-contrast
Comparison: January 23, 2014

CLINICAL DATA: Endotracheal tube positioned

EXAM:
PORTABLE CHEST - 1 VIEW

[AP]
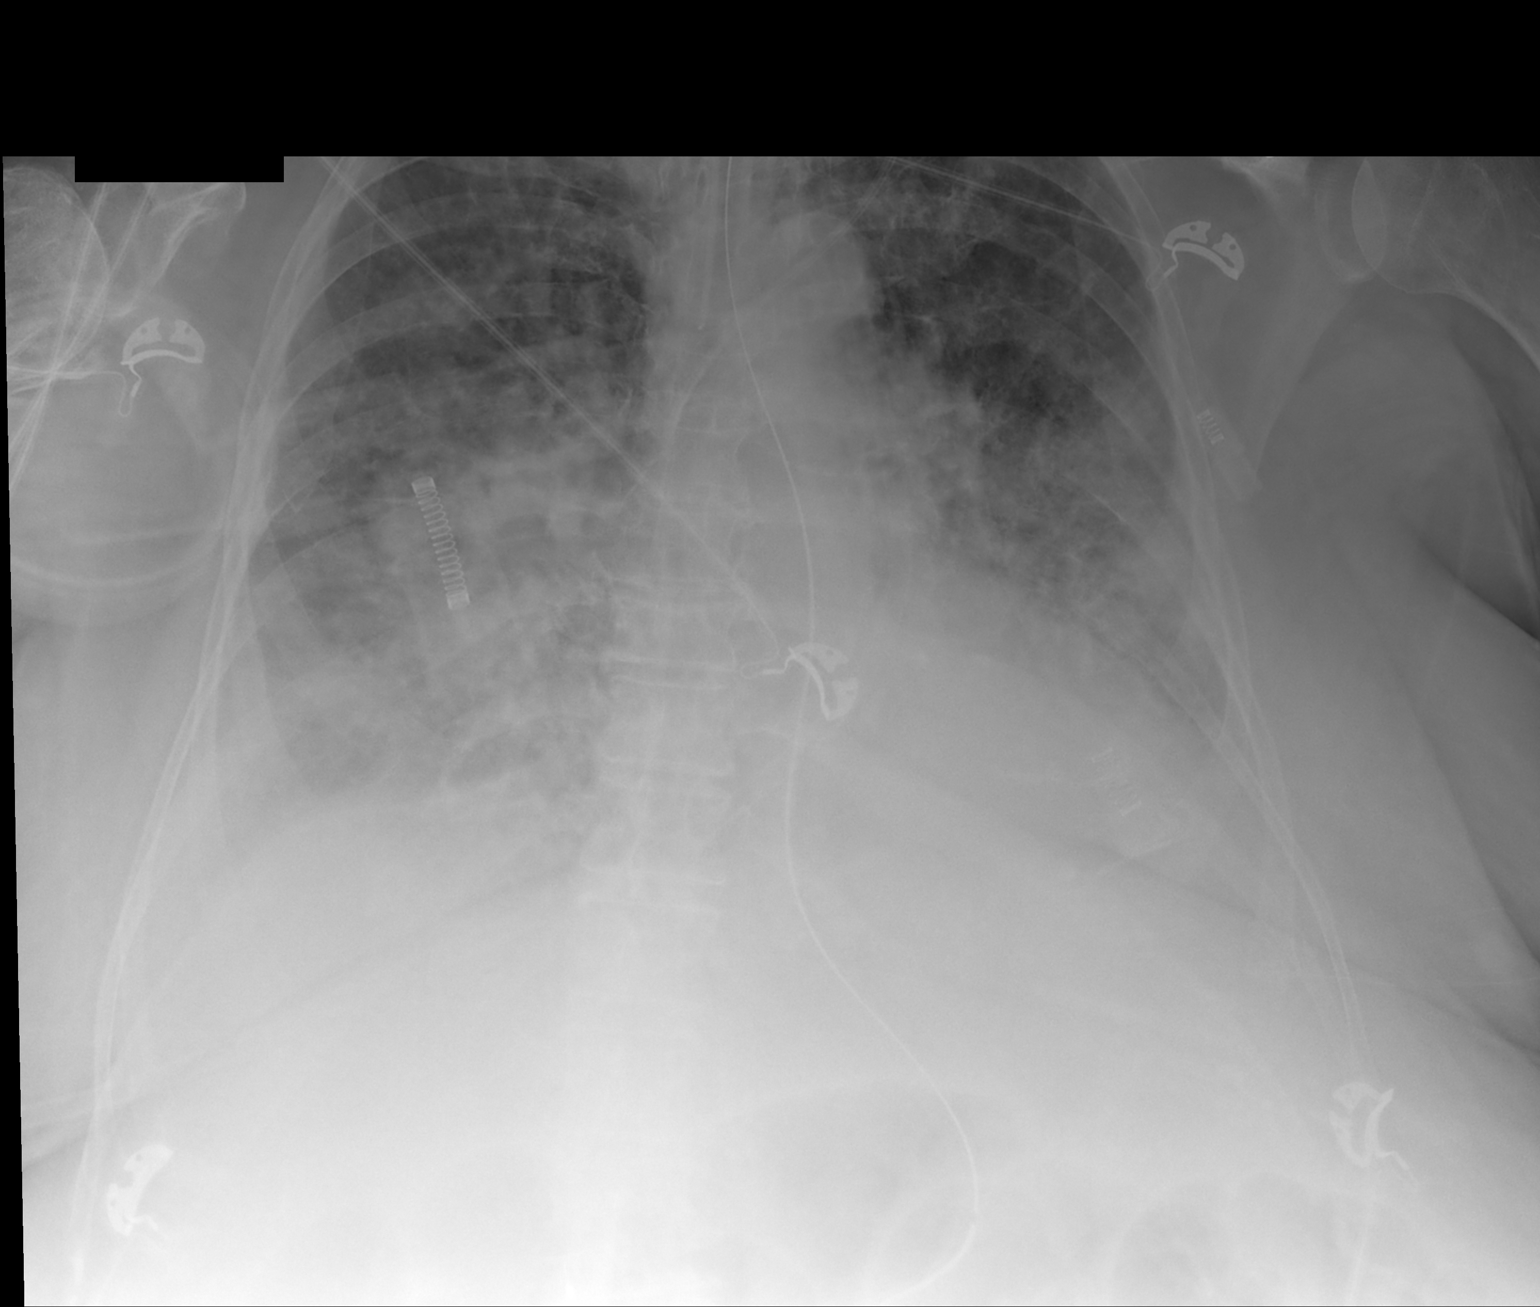

[1 of 1 positions shown; findings below may reference images not displayed]

FINDINGS: The heart size and mediastinal contours are stable. The heart size
is enlarged. Endotracheal tube, left jugular central venous line,
nasogastric tube are identified in good position without change.
Diffuse patchy opacity are identified throughout bilateral lungs
minimally worse compared to prior exam. There are bilateral pleural
effusions. Consolidation of bilateral lung bases are stable. The
visualized skeletal structures are stable.
IMPRESSION: Congestive heart failure slightly worse compared to prior exam.
Bilateral pleural effusions. Persistent consolidation of both lung
bases.

Life supporting devices in good position without interval change.

## 2014-02-27 IMAGING — CR DG CHEST 1V PORT
1 series · 1 of 1 positions shown · non-contrast
Comparison: Chest x-ray from yesterday

CLINICAL DATA: Endotracheal tube position.

EXAM:
PORTABLE CHEST - 1 VIEW

[AP]
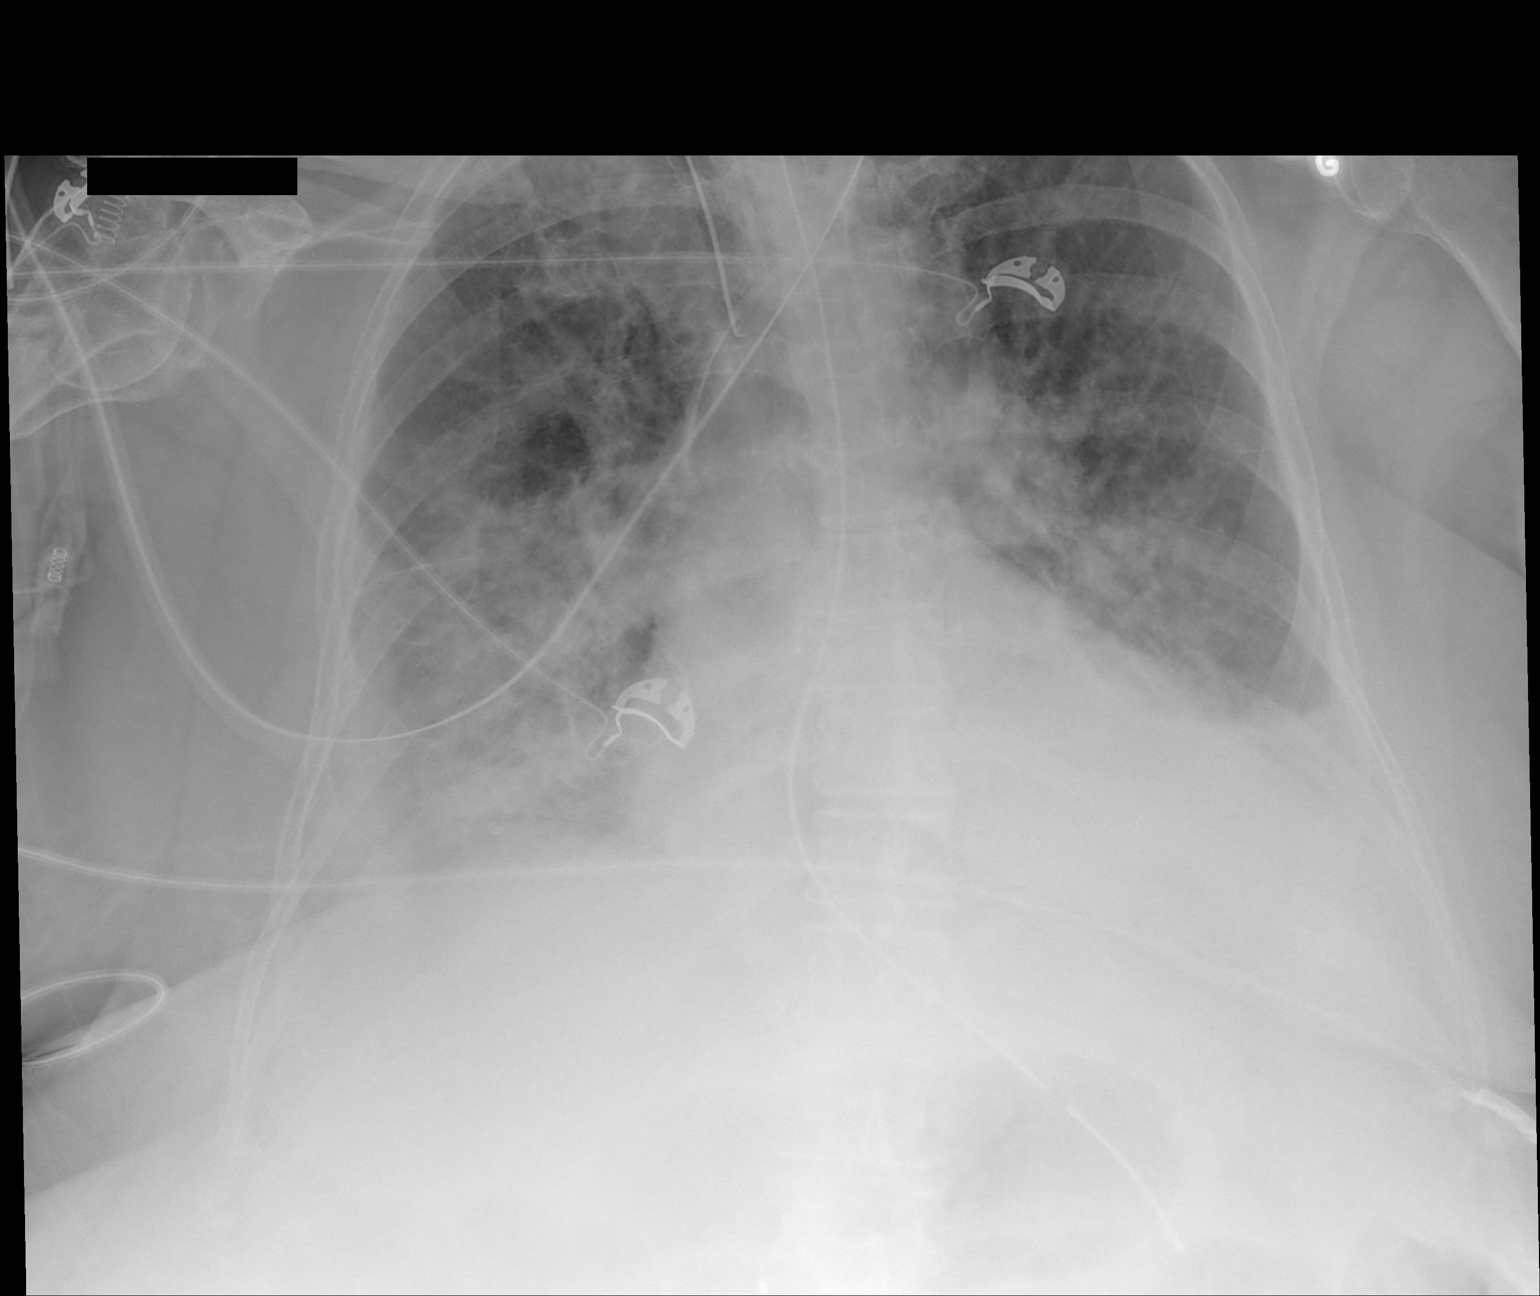

[1 of 1 positions shown; findings below may reference images not displayed]

FINDINGS: Endotracheal tube ends in the mid thoracic trachea, improved from
prior. Left IJ catheter, tip at the upper SVC. Enteric tube ends in
the proximal stomach.

Given probable differences in positioning, unchanged appearance of
bilateral pleural effusions which are small to moderate. There is
pulmonary edema which also appear stable. Stable cardiomegaly.
Mediastinal contours distorted by rightward rotation. No evidence of
pneumothorax.
IMPRESSION: 1. Improved positioning of endotracheal tube.
2. No significant change in pulmonary edema and pleural effusions.

## 2014-02-28 IMAGING — CR DG CHEST 1V PORT
1 series · 1 of 1 positions shown · non-contrast
Comparison: 01/25/2014 and earlier.

CLINICAL DATA: 72-year-old female intubated. Sepsis. Initial
encounter.

EXAM:
PORTABLE CHEST - 1 VIEW

[AP]
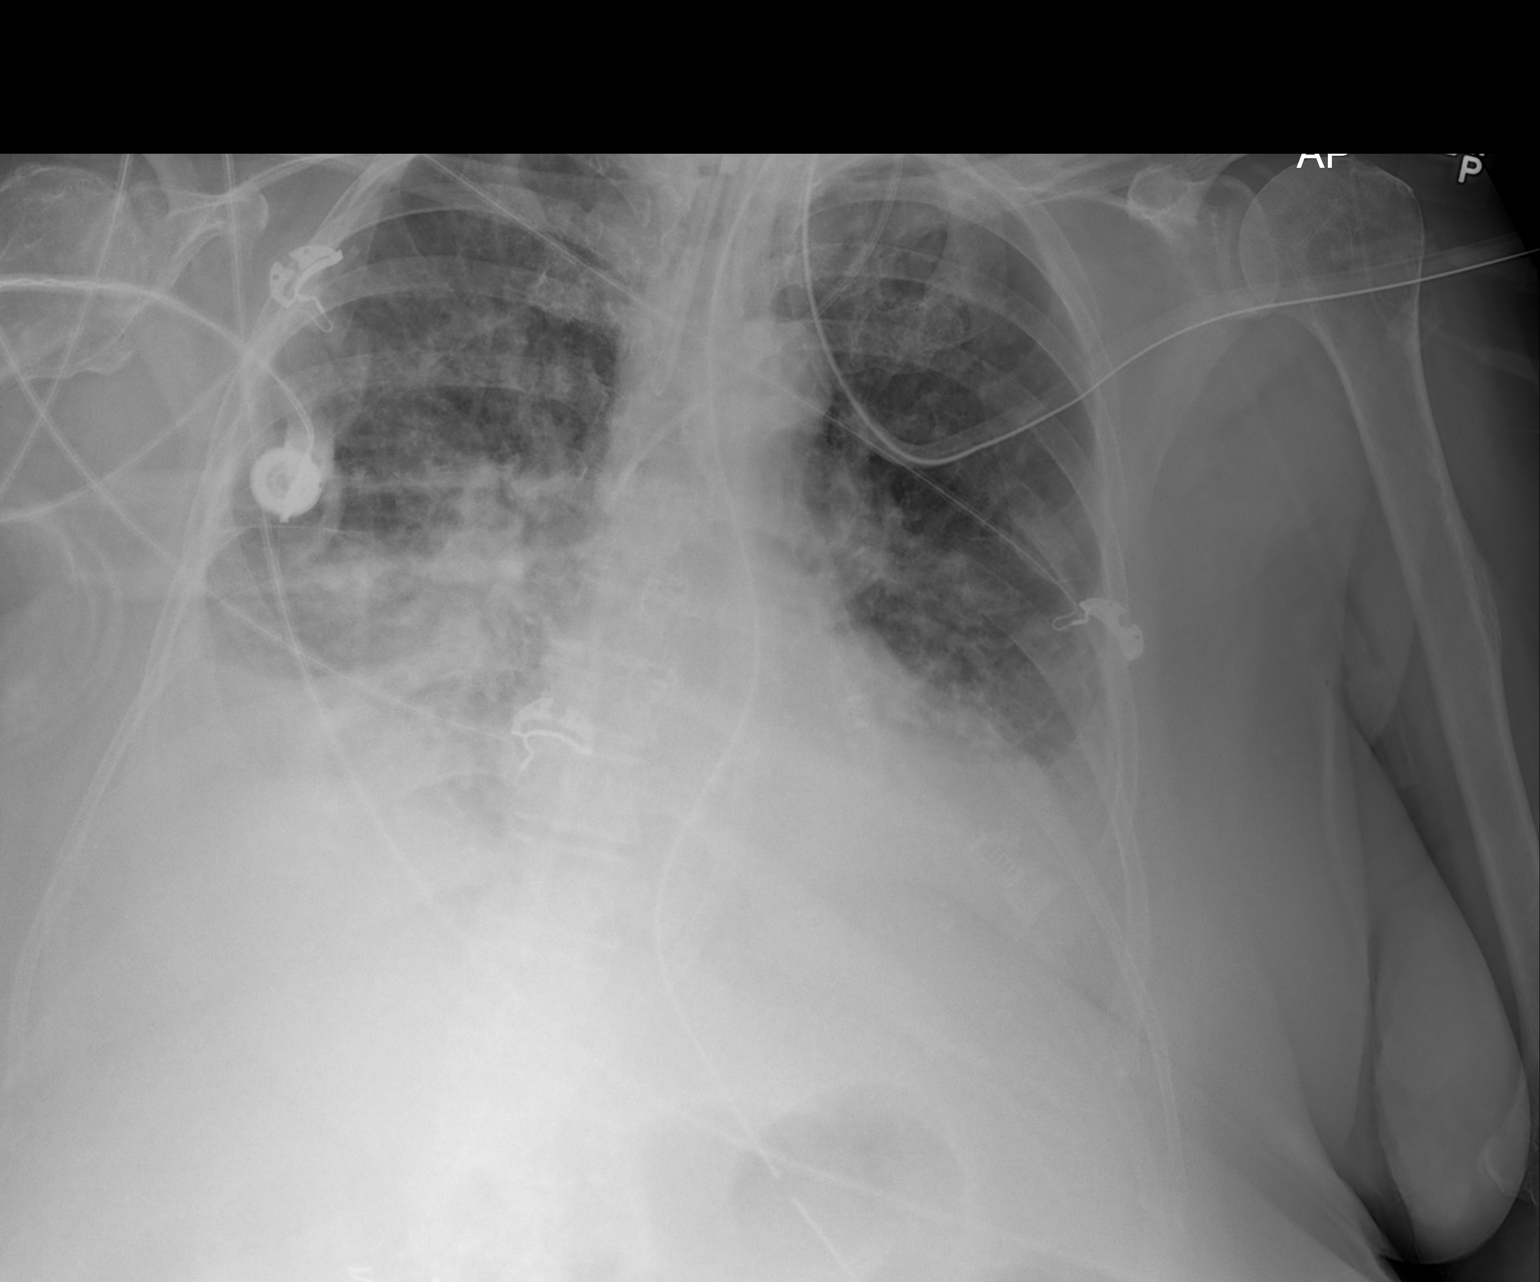

[1 of 1 positions shown; findings below may reference images not displayed]

FINDINGS: Portable AP semi upright view at 4666 hrs. Stable endotracheal tube,
tip 19 mm above the carina. Stable left IJ central line. Stable
enteric tube, side hole projects over the gastric air bubble.

Bilateral veiling basilar pulmonary opacity not significantly
changed compatible with pleural effusions. Stable cardiac size and
mediastinal contours. Superimposed streaky and interstitial
pulmonary opacity not significantly changed. No pneumothorax.
IMPRESSION: 1.  Stable lines and tubes.
2. Stable with bilateral pleural effusions and streaky perihilar
opacity.

## 2014-03-01 IMAGING — CR DG CHEST 1V PORT
1 series · 1 of 1 positions shown · non-contrast
Comparison: 01/26/2014

CLINICAL DATA: Pneumonia

EXAM:
PORTABLE CHEST - 1 VIEW

[AP]
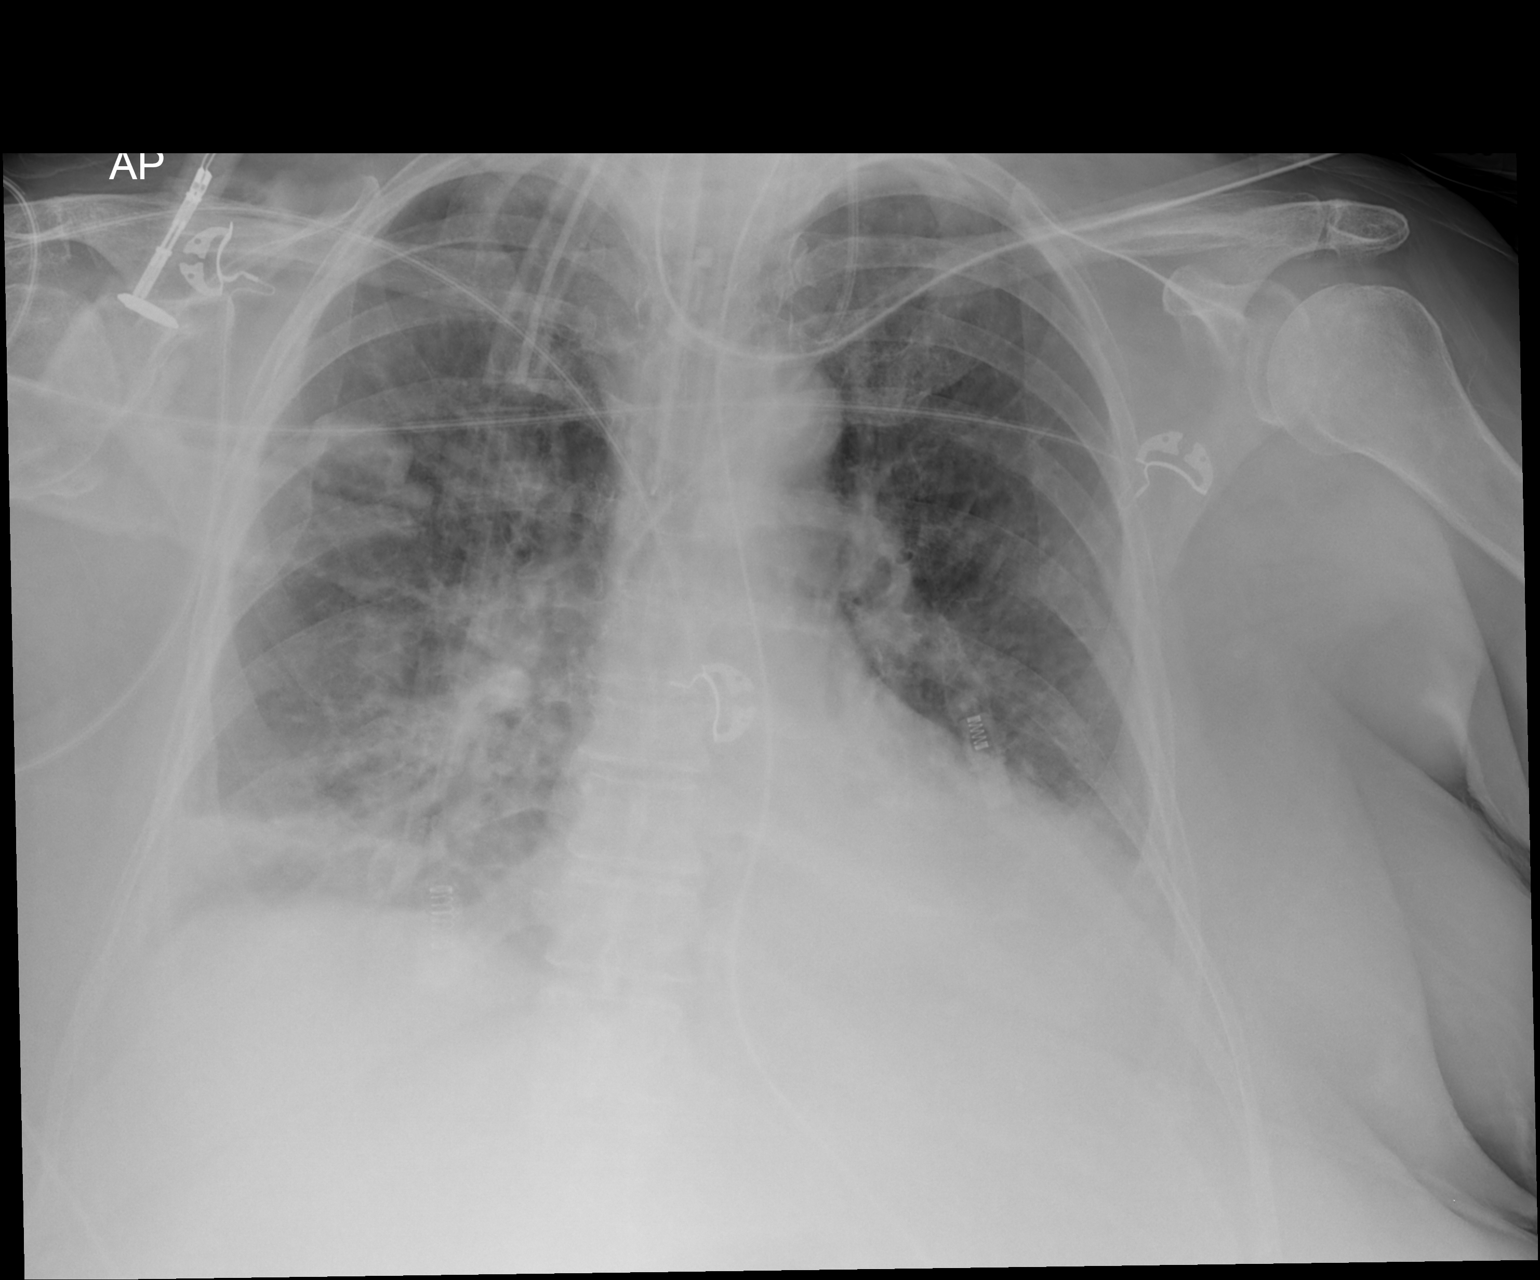

[1 of 1 positions shown; findings below may reference images not displayed]

FINDINGS: An endotracheal tube is noted approximately 1 cm above the carina.
This could be withdrawn 2-3 cm. A nasogastric catheter is noted
within the stomach and stable. A left central venous line is again
seen and stable. The cardiac shadow is at the upper limits of
normal. Patchy infiltrates are identified bilaterally but worse in
the right mid and lower lung. No to focal abnormality is same.
IMPRESSION: Endotracheal tube just above the carina. This could be withdrawn 2-3
cm.

Patchy infiltrates bilaterally.  No new focal abnormality is noted.

## 2014-03-02 IMAGING — CR DG CHEST 1V PORT
1 series · 1 of 1 positions shown · non-contrast
Comparison: Chest radiograph January 27, 2014

CLINICAL DATA: Pneumonia.

EXAM:
PORTABLE CHEST - 1 VIEW

[AP]
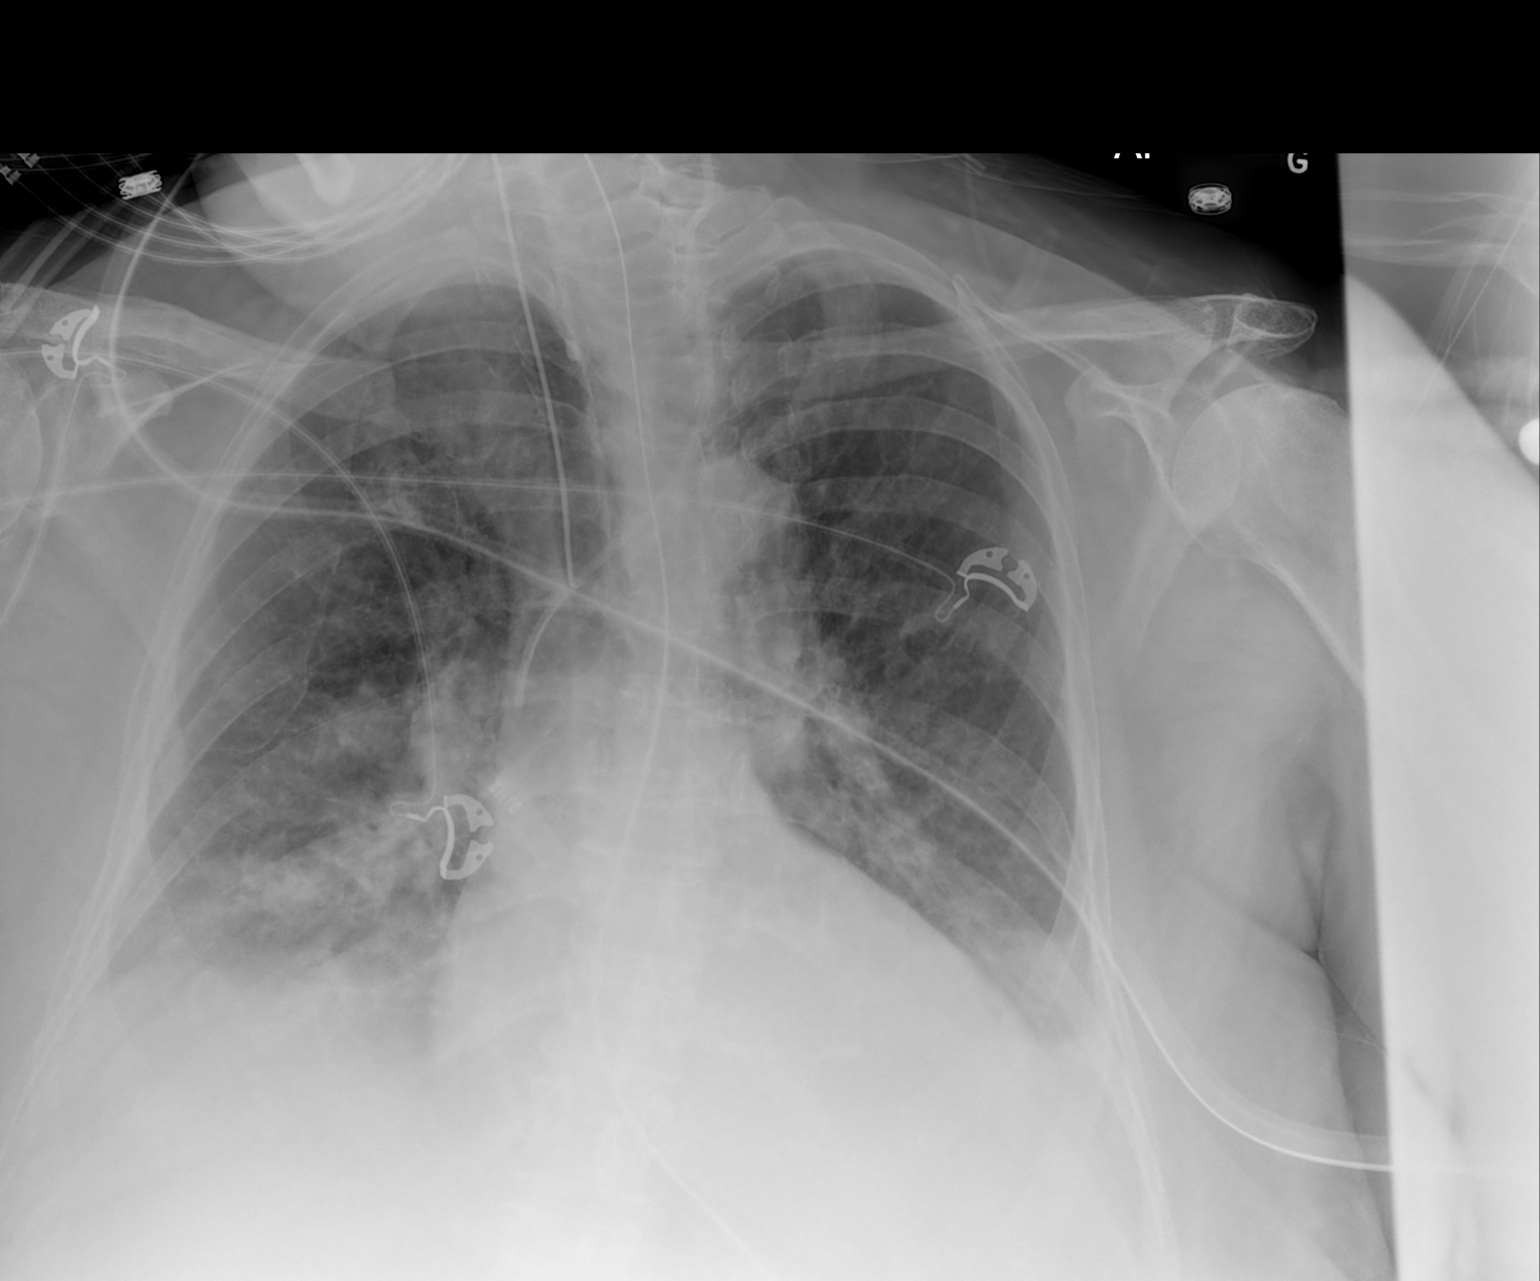

[1 of 1 positions shown; findings below may reference images not displayed]

FINDINGS: Endotracheal tube tip projects at the carina, unchanged. Nasogastric
tube past the gastroesophageal junction, distal tip not imaged. Left
internal jugular central venous catheter with distal tip projecting
in proximal superior vena cava. Interstitial prominence with patchy
airspace opacities are decreased. Trace pleural effusions. No
pneumothorax. Cardiomediastinal silhouette is nonsuspicious. Soft
tissue planes and included osseous structures are unchanged.
IMPRESSION: No apparent change in life-support lines, endotracheal tube tip
projects at the carina, consider 2 cm of retraction.

Slightly decreased airspace opacities may reflect improving
pneumonia with trace residual pleural effusions.

  By: [employer]

## 2014-03-03 IMAGING — CR DG CHEST 1V PORT
1 series · 1 of 1 positions shown · non-contrast
Comparison: January 28, 2014

CLINICAL DATA: Hypoxia

EXAM:
PORTABLE CHEST - 1 VIEW

[AP]
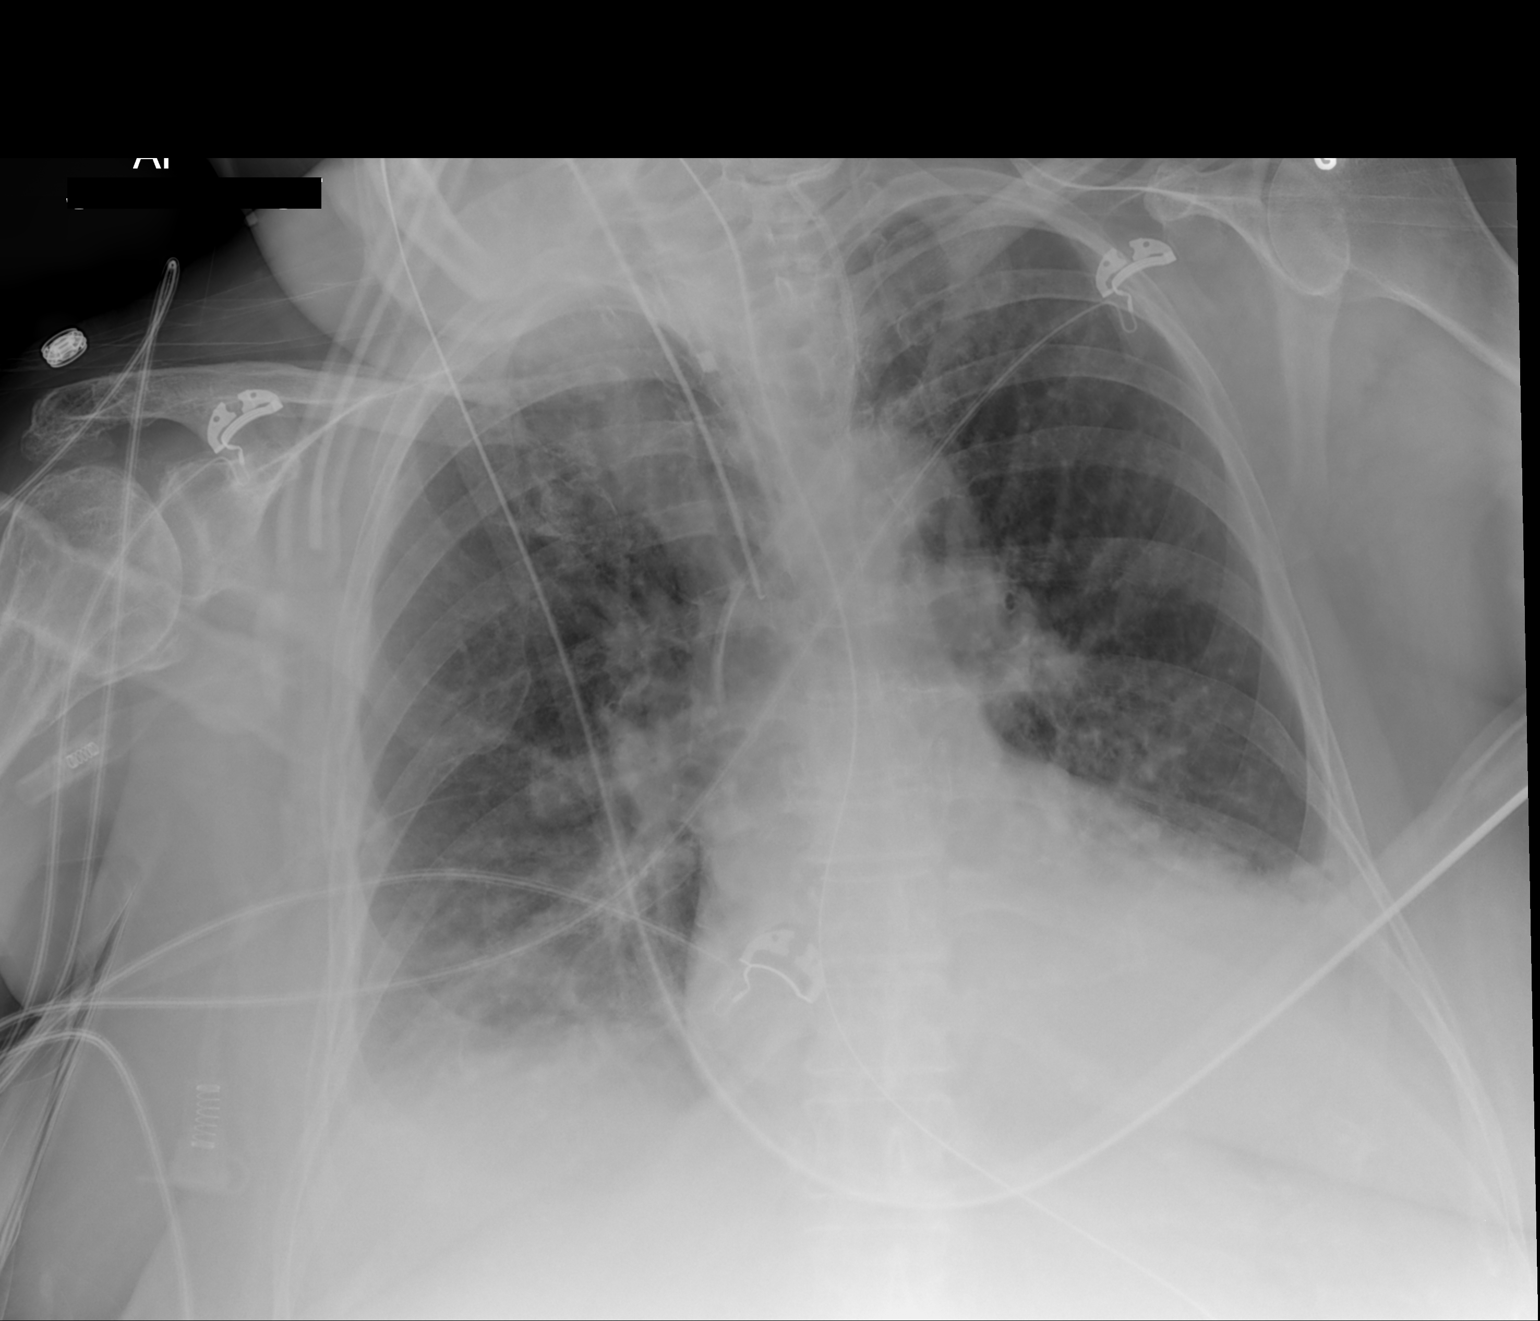

[1 of 1 positions shown; findings below may reference images not displayed]

FINDINGS: Endotracheal tube tip is 1.0 cm above the carina. Central catheter
tip is in the superior vena cava. Nasogastric tube tip and side port
are below the diaphragm. No pneumothorax.

There is patchy airspace consolidation in the left base, stable.
There is atelectatic change in the right base, slightly less than on
recent prior study. Heart size and pulmonary vascularity are normal.
IMPRESSION: Tube and catheter positions as described without pneumothorax. It
may be prudent to consider retracting endotracheal tube
approximately 3 cm. Persistent patchy airspace disease left base.
Mild right base atelectasis.

## 2014-03-04 IMAGING — CR DG CHEST 1V PORT
1 series · 1 of 1 positions shown · non-contrast
Comparison: 01/29/2014 and 01/28/2014

CLINICAL DATA: Septic shock.

EXAM:
PORTABLE CHEST - 1 VIEW

[AP]
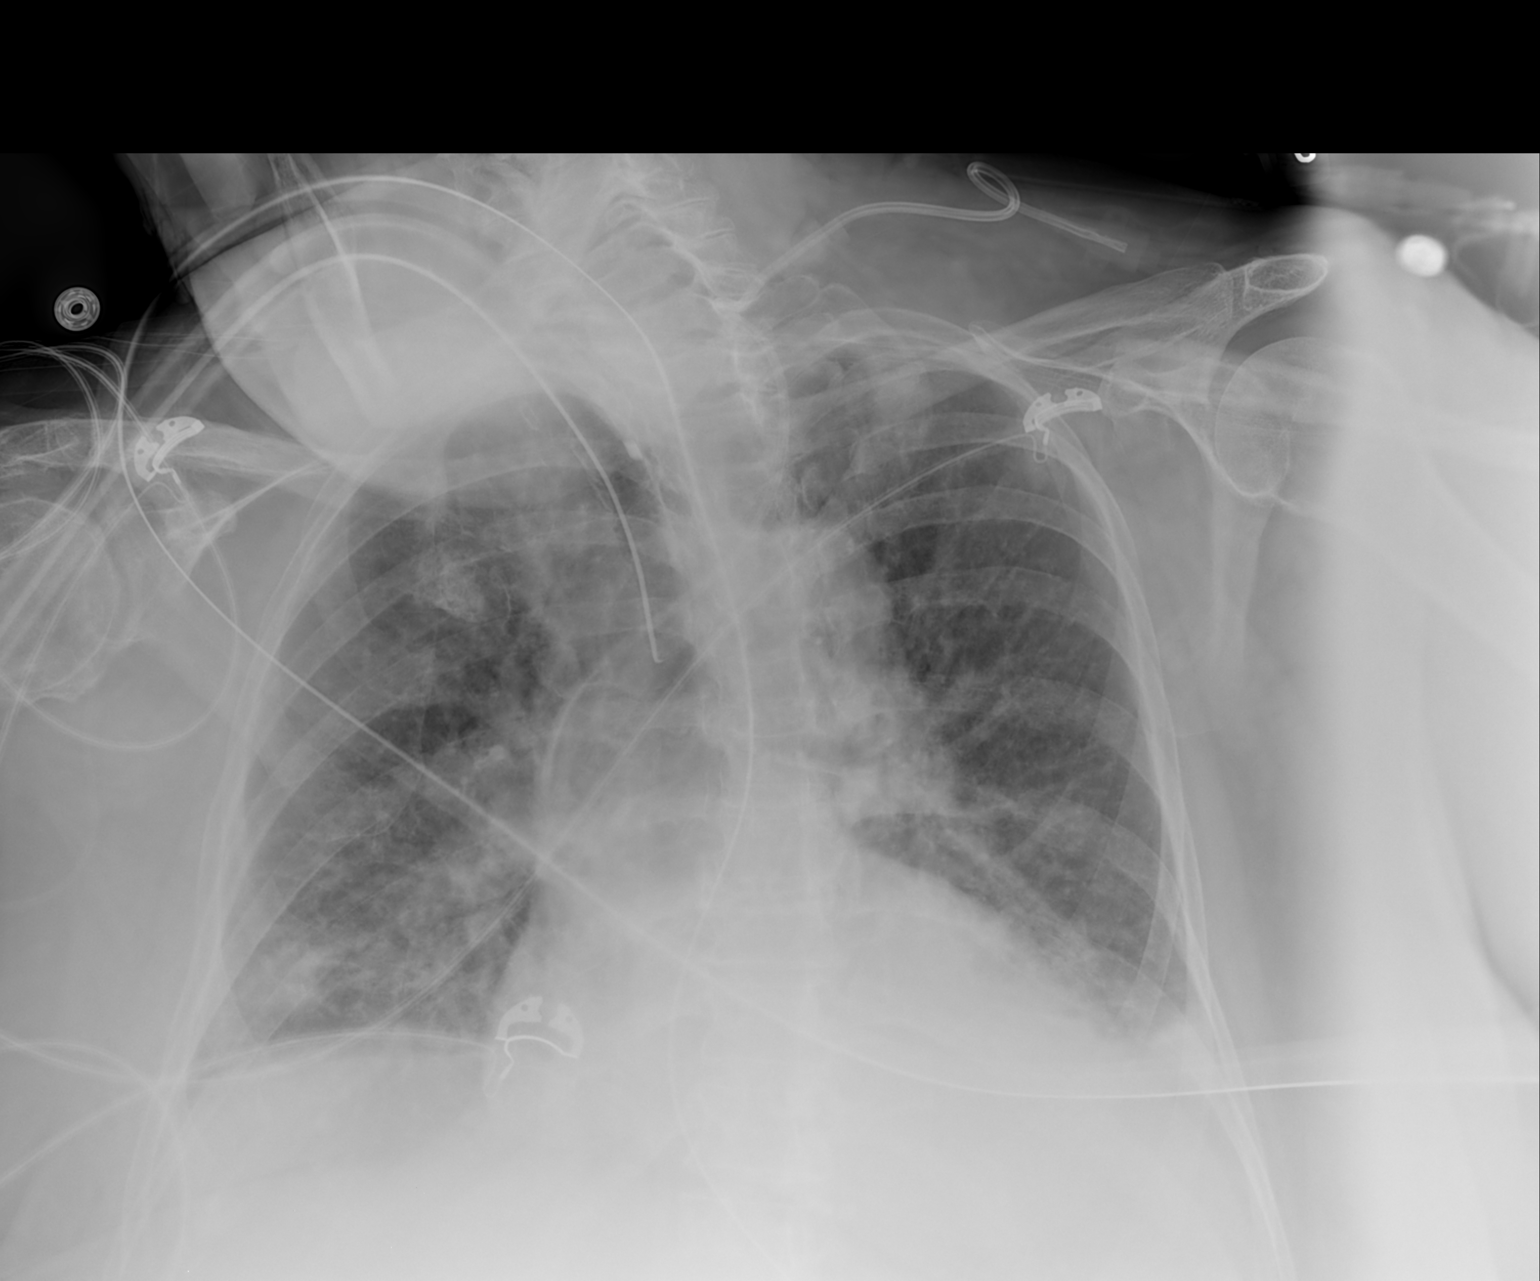

[1 of 1 positions shown; findings below may reference images not displayed]

FINDINGS: Endotracheal tube is approximately 2 cm above the carina, unchanged.
Central venous catheter and OG tube appear in good position. Small
left pleural effusion and slight atelectasis at the right base
persist. There is slight pulmonary vascular prominence.
IMPRESSION: No significant change.

## 2014-03-05 IMAGING — CR DG CHEST 1V PORT
1 series · 1 of 1 positions shown · non-contrast
Comparison: Single view of the chest 01/30/2014.

CLINICAL DATA: Intubated patient.

EXAM:
PORTABLE CHEST - 1 VIEW

[AP]
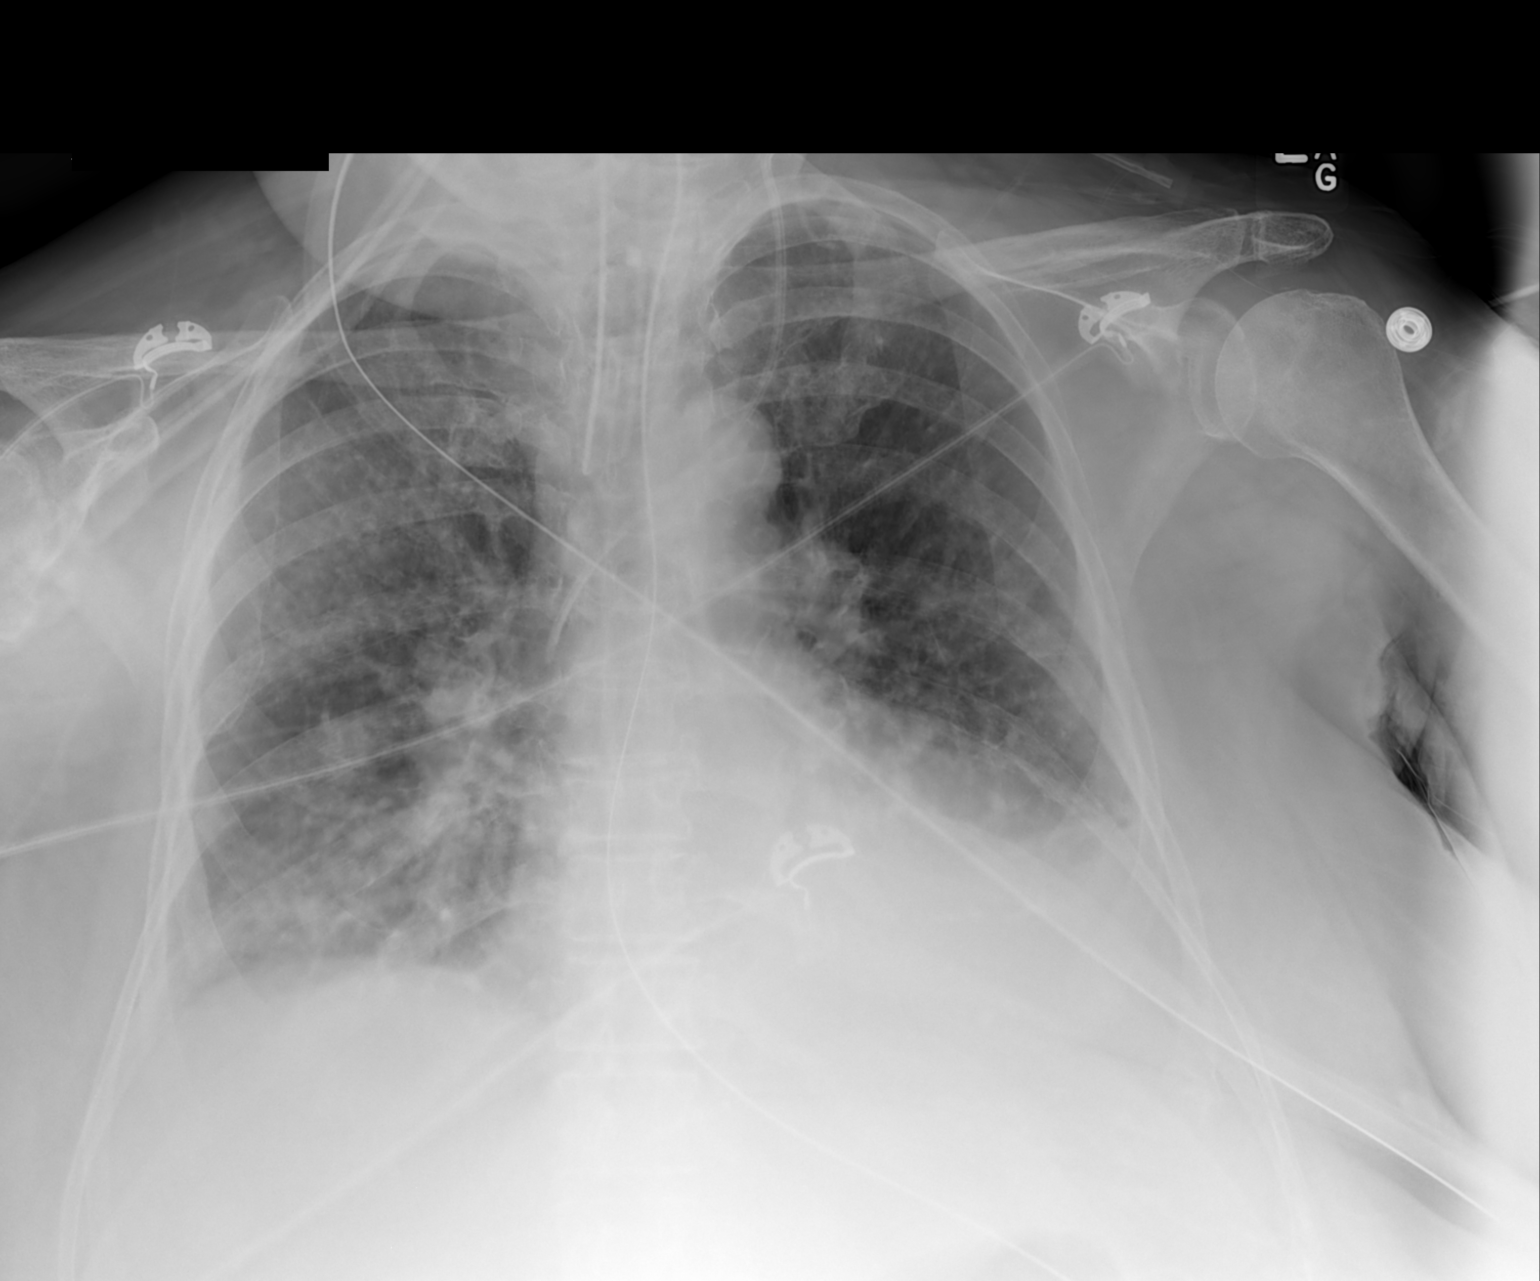

[1 of 1 positions shown; findings below may reference images not displayed]

FINDINGS: Support tubes and lines are unchanged. There small bilateral pleural
effusions, larger on the left. Bibasilar atelectasis and pulmonary
vascular congestion persist. Heart size is upper normal.
IMPRESSION: No marked change in small bilateral pleural effusions, basilar
atelectasis and vascular congestion.

## 2014-03-06 IMAGING — CR DG CHEST 1V PORT
1 series · 1 of 1 positions shown · non-contrast
Comparison: January 31, 2014

CLINICAL DATA: Hypoxia

EXAM:
PORTABLE CHEST - 1 VIEW

[AP]
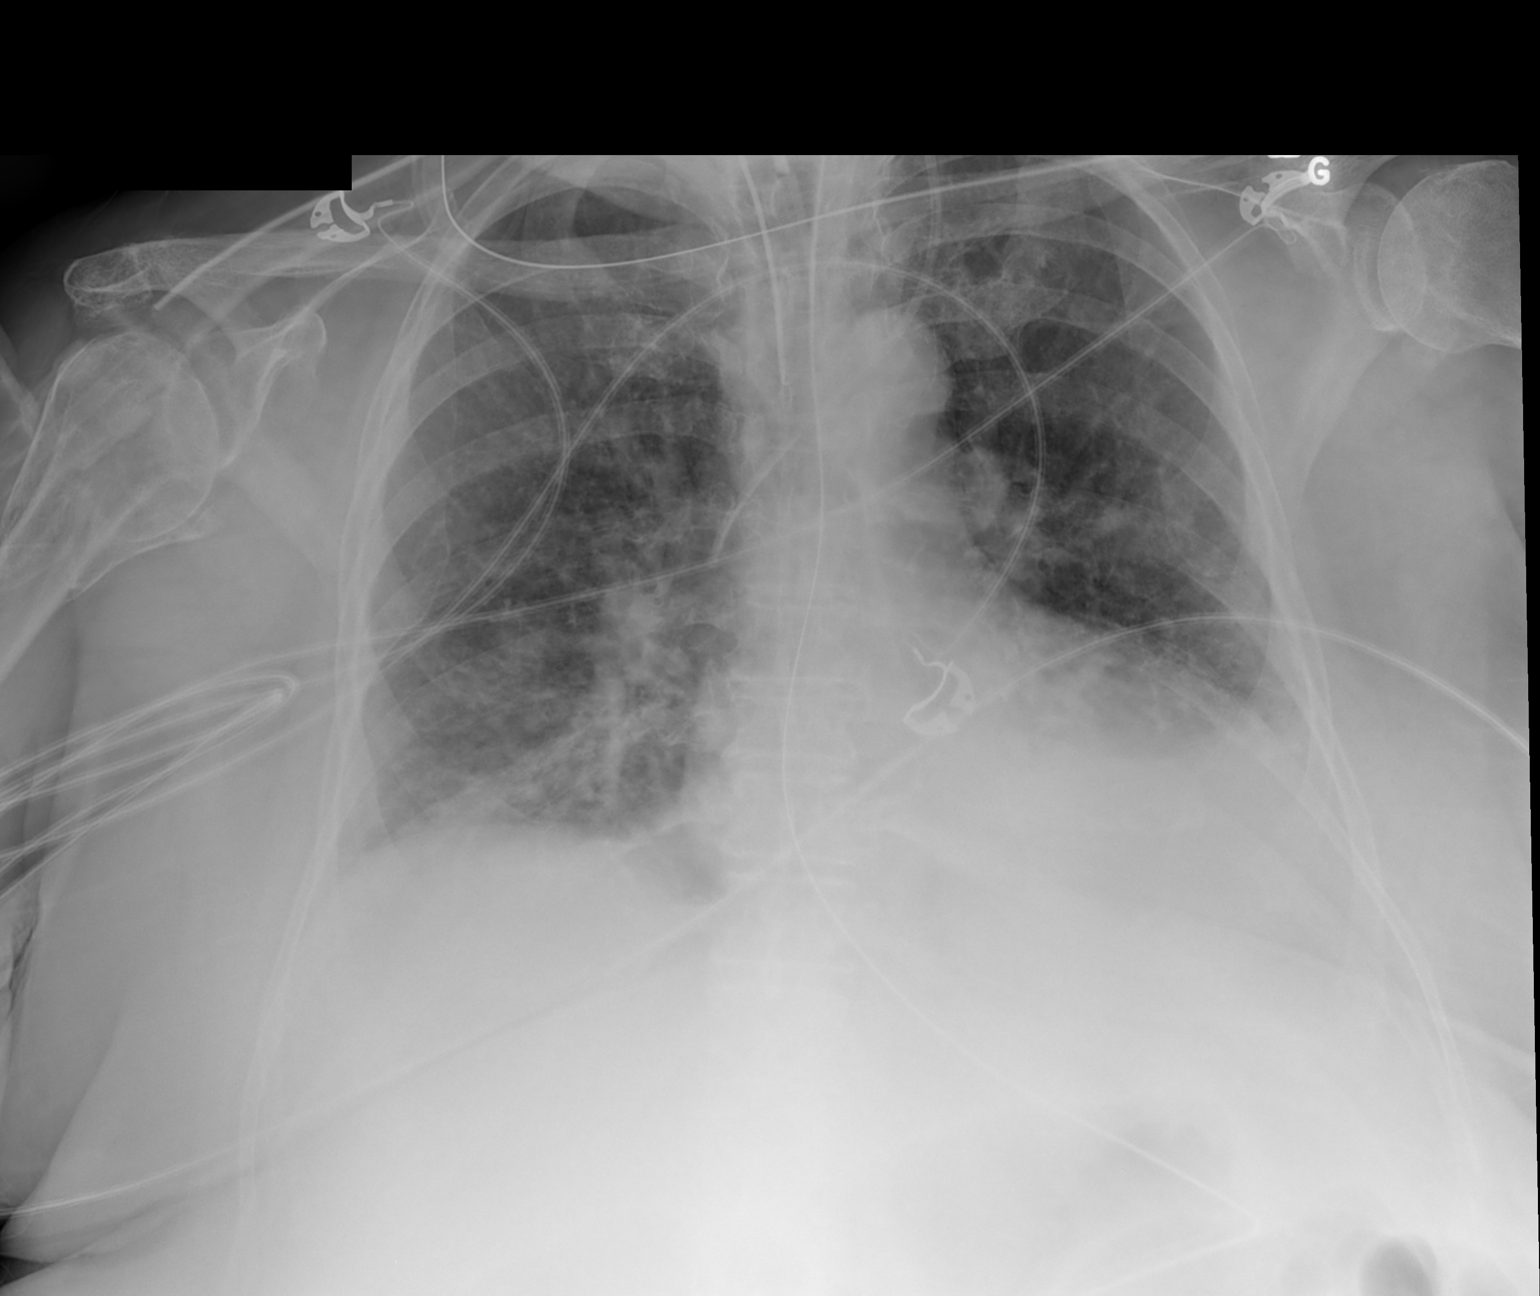

[1 of 1 positions shown; findings below may reference images not displayed]

FINDINGS: Endotracheal tube tip is 2.8 cm above the carina. Nasogastric tube
tip and side port are in the stomach. Central catheter tip is in the
superior vena cava. No pneumothorax.

There is moderate interstitial edema bilaterally, stable. There are
small effusions bilaterally. Heart is mildly enlarged. Pulmonary
vascularity demonstrates mild pulmonary venous hypertension. There
is no appreciable airspace consolidation or adenopathy. There is
evidence of old trauma involving the proximal right humerus.
IMPRESSION: Tube and catheter positions appear essentially stable without
apparent pneumothorax. Evidence of a degree of congestive heart
failure, stable.

## 2014-03-07 IMAGING — CR DG CHEST 1V PORT
1 series · 1 of 1 positions shown · non-contrast
Comparison: 02/01/2014.

CLINICAL DATA: Evaluate ET tube.

EXAM:
PORTABLE CHEST - 1 VIEW

[AP]
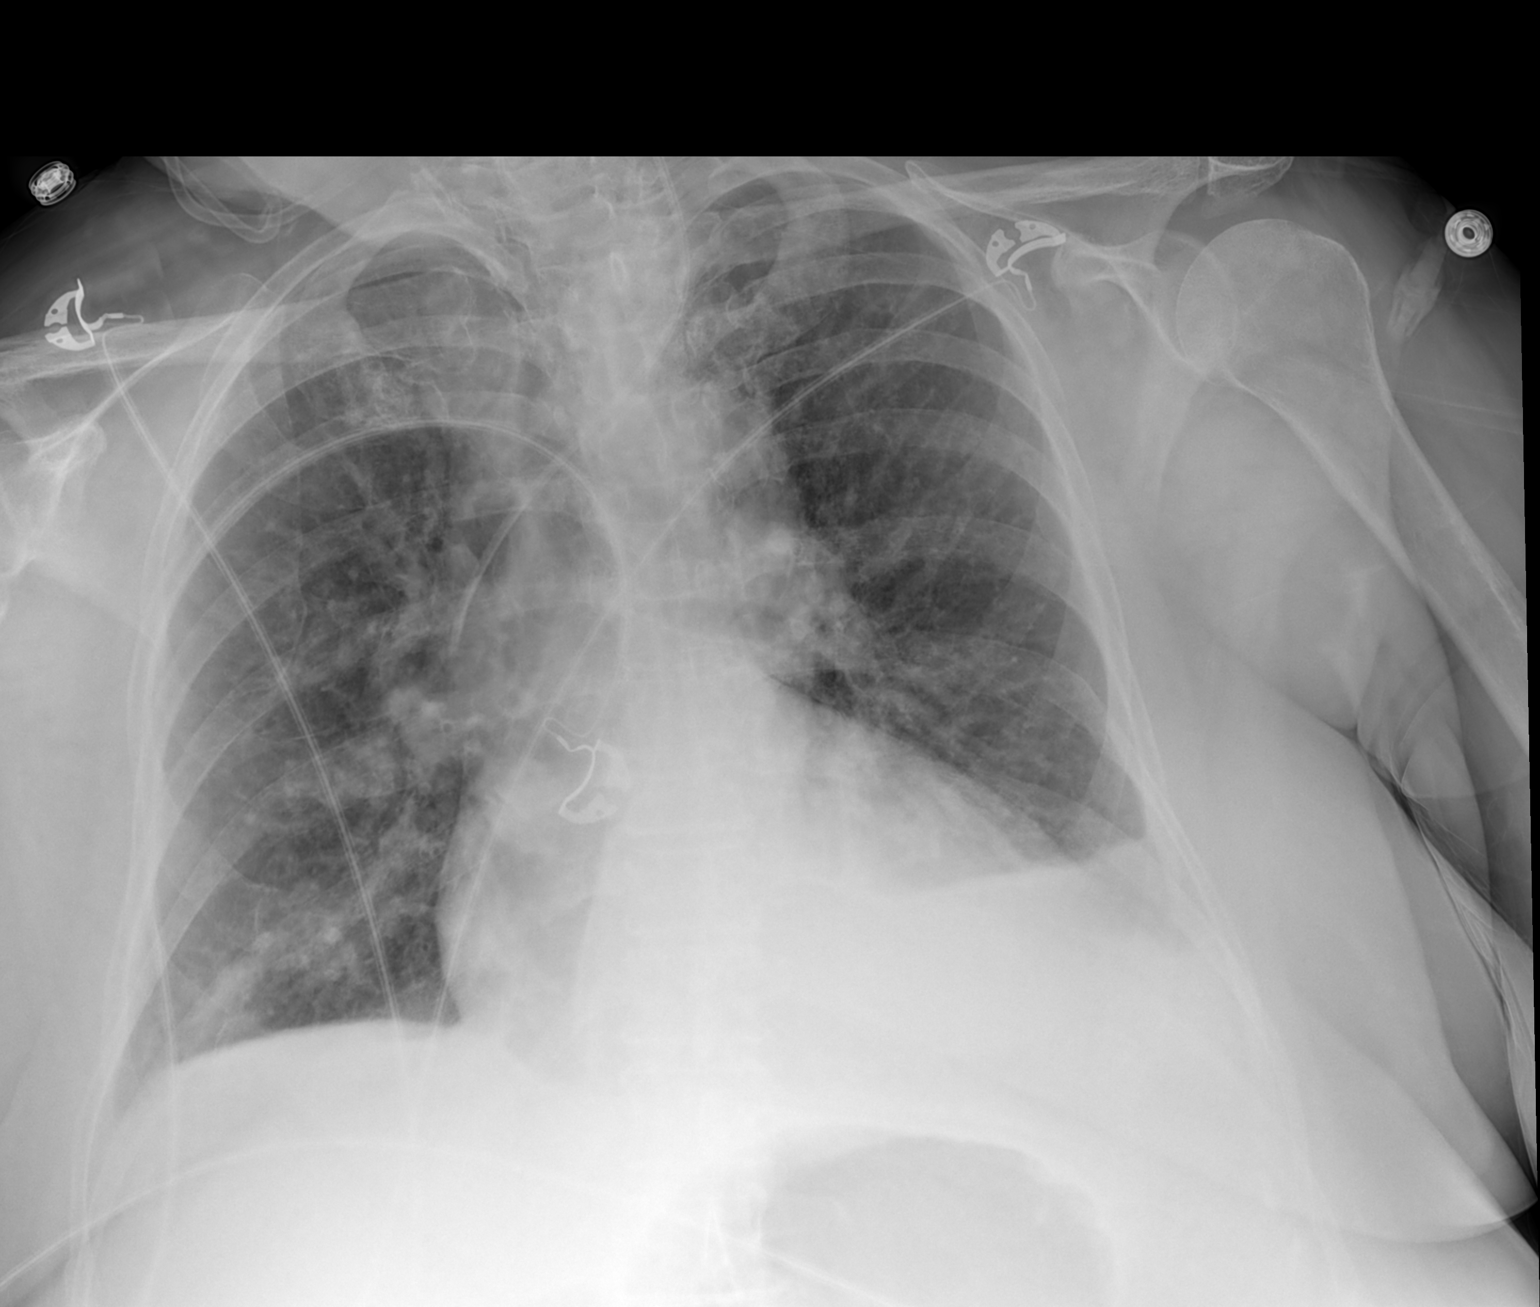

[1 of 1 positions shown; findings below may reference images not displayed]

FINDINGS: Left IJ central venous catheter tip projects over the superior vena
cava. Patient is rotated, limiting evaluation.

Stable cardiac and mediastinal contours. Persistent small left
pleural effusion underlying consolidative opacities. Unchanged
pulmonary vascular redistribution.

No endotracheal tube is identified on this examination. Interval
removal of enteric tube.
IMPRESSION: No endotracheal tube is identified, correlate for interval
extubation.

Unchanged small left pleural effusion and underlying consolidative
opacities.

## 2014-03-11 ENCOUNTER — Encounter: Payer: Self-pay | Admitting: Pulmonary Disease

## 2014-03-11 ENCOUNTER — Ambulatory Visit (INDEPENDENT_AMBULATORY_CARE_PROVIDER_SITE_OTHER): Payer: Medicare Other | Admitting: Pulmonary Disease

## 2014-03-11 VITALS — BP 102/80 | HR 89 | Temp 98.1°F | Ht 62.0 in | Wt 199.0 lb

## 2014-03-11 DIAGNOSIS — J984 Other disorders of lung: Secondary | ICD-10-CM

## 2014-03-11 DIAGNOSIS — J438 Other emphysema: Secondary | ICD-10-CM

## 2014-03-11 DIAGNOSIS — R918 Other nonspecific abnormal finding of lung field: Secondary | ICD-10-CM

## 2014-03-11 DIAGNOSIS — J439 Emphysema, unspecified: Secondary | ICD-10-CM

## 2014-03-11 MED ORDER — FLUTICASONE-SALMETEROL 250-50 MCG/DOSE IN AEPB: 1.0000 | INHALATION_SPRAY | Freq: Two times a day (BID) | RESPIRATORY_TRACT | Status: AC
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
## 2014-03-11 NOTE — Addendum Note (Signed)
Addended by: Virl Cagey on: 03/11/2014 04:25 PM   Modules accepted: Orders

## 2014-03-11 NOTE — Patient Instructions (Signed)
Stay on spiriva, but decrease advair to the 250/50 strength. Do not use neb treatments regularly.  Only use for a "bad day" or emergencies. Try to stay as active as possible. followup with me again in 12mos.

## 2014-03-11 NOTE — Assessment & Plan Note (Signed)
The patient has a history of moderate to severe COPD by PFTs in 1997, and currently is on an excellent bronchodilator regimen as well as oxygen. I have explained to her that she will have chronic dyspnea, and that her medications hopefully will improve her quality of life to some degree. I have also stressed to her the importance of weight loss and some type of conditioning program. She will followup with me again in approximately 6 months.

## 2014-03-11 NOTE — Assessment & Plan Note (Signed)
The patient has a history of a right middle lobe pulmonary nodule, but now has findings in the apex from a CT chest in January. This will need to be followed up for further evaluation.

## 2014-03-18 ENCOUNTER — Ambulatory Visit
Admission: RE | Admit: 2014-03-18 | Discharge: 2014-03-18 | Disposition: A | Discharge status: Home / Self Care | Payer: Medicare Other | Source: Ambulatory Visit | Attending: Internal Medicine | Admitting: Internal Medicine

## 2014-03-18 DIAGNOSIS — R911 Solitary pulmonary nodule: Secondary | ICD-10-CM

## 2014-03-18 MED ORDER — IOHEXOL 300 MG/ML  SOLN
75.0000 mL | Freq: Once | INTRAMUSCULAR | Status: AC | PRN
  Administered 2014-03-18: 75 mL via INTRAVENOUS

## 2014-03-20 ENCOUNTER — Other Ambulatory Visit: Payer: Self-pay | Admitting: *Deleted

## 2014-03-20 MED ORDER — OXYCODONE HCL ER 10 MG PO T12A: EXTENDED_RELEASE_TABLET | ORAL | Status: DC

## 2014-03-20 NOTE — Telephone Encounter (Signed)
[facility] 

## 2014-04-15 ENCOUNTER — Non-Acute Institutional Stay (SKILLED_NURSING_FACILITY): Payer: Medicare Other | Admitting: Internal Medicine

## 2014-04-15 DIAGNOSIS — J309 Allergic rhinitis, unspecified: Secondary | ICD-10-CM

## 2014-04-15 DIAGNOSIS — R911 Solitary pulmonary nodule: Secondary | ICD-10-CM

## 2014-04-15 DIAGNOSIS — J449 Chronic obstructive pulmonary disease, unspecified: Secondary | ICD-10-CM

## 2014-04-16 ENCOUNTER — Non-Acute Institutional Stay (SKILLED_NURSING_FACILITY): Payer: Medicare Other | Admitting: Internal Medicine

## 2014-04-16 DIAGNOSIS — J449 Chronic obstructive pulmonary disease, unspecified: Secondary | ICD-10-CM

## 2014-04-16 DIAGNOSIS — E1149 Type 2 diabetes mellitus with other diabetic neurological complication: Secondary | ICD-10-CM

## 2014-04-16 DIAGNOSIS — J4489 Other specified chronic obstructive pulmonary disease: Secondary | ICD-10-CM

## 2014-04-16 DIAGNOSIS — I10 Essential (primary) hypertension: Secondary | ICD-10-CM

## 2014-04-16 DIAGNOSIS — G8929 Other chronic pain: Secondary | ICD-10-CM

## 2014-04-17 NOTE — Progress Notes (Addendum)
Patient ID: [name], female   DOB: August 30, 1941, 73 y.o.   MRN: [id number]                   PROGRESS NOTE  DATE:  04/15/2014     FACILITY: [facility]    LEVEL OF CARE:   SNF   Acute Visit   HISTORY OF PRESENT ILLNESS:  This is a patient with known COPD, on chronic oxygen.  She also has bipolar disorder, tardive dyskinesia.    She was recently in hospital with pyelonephritis,  respiratory failure.  She has had a follow-up CT scan and has seen Dr. Gwenette Greet.  The CT scan showed complete clearing of the right upper lung apex and complete clearing of the infiltrate at the right base posteriorly.  A 3.6 mm nodule in the left upper lobe appears improved.  It was suggested a follow-up CT scan in 6-12 months.  This does not seem unreasonable.    In the meantime, she is complaining of burning and itching in her eyes and also a runny nose.  She states she has a history of allergic rhinitis and seasonal allergies to pollen.   In fact, she said she saw Dr. Velora Heckler at [facility] for allergy issues many years ago.    REVIEW OF SYSTEMS:   CHEST/RESPIRATORY:  She has some coughing, but does not complain of excessive shortness of breath.   CARDIAC:   No clear chest pain.   GI:  No nausea, vomiting or diarrhea.    PHYSICAL EXAMINATION:   O2 SATURATIONS:  Pulse ox is 98% on 2 L.   RESPIRATIONS:  20.   PULSE:  85.   GENERAL APPEARANCE:  Tardive dyskinesia as noted.   CHEST/RESPIRATORY:  Very shallow air entry bilaterally.   CARDIOVASCULAR:  CARDIAC:   Heart sounds are tachy, but there are no murmurs.  She appears to be euvolemic.    ASSESSMENT/PLAN:  Seasonal allergies.  I will give her Claritin and see if this helps.     COPD.  This is severe and chronic, but appears to be stable.    Recent CT scan follow-up for pulmonary nodules, with a suggestion to follow this in 6-12 months.  I certainly favor the 84-month issue.  She would not be able to stand any aggressive interventions for  a presumed malignancy.  However, the patient and her daughter may wish information.

## 2014-04-18 NOTE — Progress Notes (Signed)
         PROGRESS NOTE  DATE: 04-16-14  FACILITY: Nursing Home Location: [facility]  LEVEL OF CARE: SNF (31)  Routine Visit  CHIEF COMPLAINT:  Manage diabetes mellitus, COPD and hypertension  HISTORY OF PRESENT ILLNESS:  REASSESSMENT OF ONGOING PROBLEM(S):  HTN: Pt 's HTN remains stable.  Denies CP, sob, DOE, headaches, dizziness or visual disturbances.  No complications from the medications currently being used.  Last BP : 138/62, 155/80, 143/71, 137/80, 122/69, 141/80, 144/91.  DM:pt's DM is stable.  Pt denies polyuria, polydipsia, polyphagia, changes in vision or hypoglycemic episodes.  No complications noted from the medication presently being used.  Last hemoglobin A1c is: 9 in 3/14, in 7/14 hemoglobin A1c 6.9.  COPD: the COPD remains stable.  Pt denies sob, cough, wheezing or declining exercise tolerance.  No complications from the medications presently being used.  PAST MEDICAL HISTORY : Reviewed.  No changes.  CURRENT MEDICATIONS: Reviewed per [facility]  REVIEW OF SYSTEMS:  GENERAL: no change in appetite, no fatigue, no weight changes, no fever, chills or weakness RESPIRATORY: no cough, SOB, DOE, wheezing, hemoptysis CARDIAC: no chest pain, or palpitations, complains of lower extremity edema GI: no abdominal pain, diarrhea, constipation, heart burn, nausea or vomiting  PHYSICAL EXAMINATION  VS: See vital signs section  GENERAL: no acute distress, moderately obese body habitus EYES: Normal sclerae, normal conjunctivae, no discharge NECK: supple, trachea midline, no neck masses, no thyroid tenderness, no thyromegaly LYMPHATICS: No cervical lymphadenopathy, no supraclavicular lymphadenopathy RESPIRATORY: breathing is even & unlabored, BS CTAB CARDIAC: RRR, no murmur,no extra heart sounds, +2 bilateral lower extremity edema GI: abdomen soft, normal BS, no masses, no tenderness, no hepatomegaly, no splenomegaly PSYCHIATRIC: the patient is alert &  oriented to person, affect & behavior appropriate  LABS/RADIOLOGY: 3-15 hemoglobin 10.9, MCV 92 otherwise CBC normal, glucose 225 otherwise CMP normal  8-14 Depakote level 59.4  6/14 triglycerides 299, HDL 33 otherwise Fasting lipid panel normal, liver profile normal  5-14 CBC normal  4/14 glucose 226 otherwise BMP normal 3/14 hemoglobin 11.8, MCV 83.6 otherwise CBC normal 11/13 glucose 202 otherwise CMP normal, Depakote 38.8 Toe/13 triglycerides 275, LDL 118, total cholesterol 209  ASSESSMENT/PLAN:  COPD-well compensated. diabetes mellitus with neuropathy-well controlled. Check hemoglobin A1c hypertension-blood pressure borderline. chronic pain-no pain c/o today. hyperlipidemia-triglycerides elevated secondary to uncontrolled diabetes before. Recheck fasting lipid panel GERD-continue Nexium Allergic rhinitis-claritin was started Depression-continue effexor Anxiety-stable Check Depakote level  CPT CODE: 54627  Edgar Frisk. Durwin Reges, [place] [phone]

## 2014-04-20 IMAGING — CT CT CHEST W/ CM
2 of 3 series · 15 of 36 positions shown, 18 images · IV contrast (75CC OMNI 300)
Comparison: CT scans dated 12/31/2013 and 09/12/2009

CLINICAL DATA: Pulmonary nodules.

EXAM:
CT CHEST WITH CONTRAST
TECHNIQUE: Multidetector CT imaging of the chest was performed during
intravenous contrast administration.
CONTRAST:  75mL OMNIPAQUE IOHEXOL 300 MG/ML  SOLN

[Series 2: chest with · axial · 0.70mm/px · z∈[-335,-65]mm · 12 of 64 slices shown, 15 images]
[im 5/64  mediastinal]
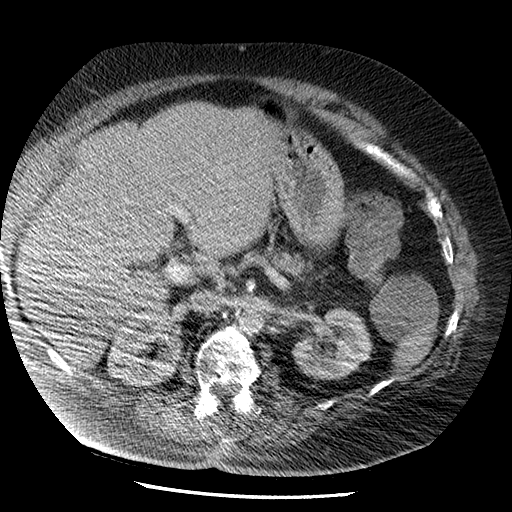
[im 5/64  lung]
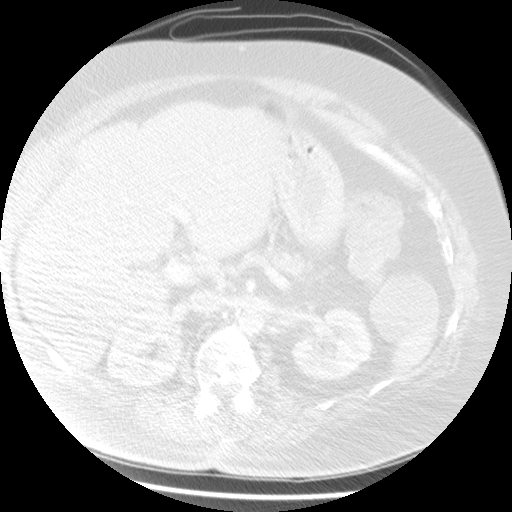
[im 10/64  lung]
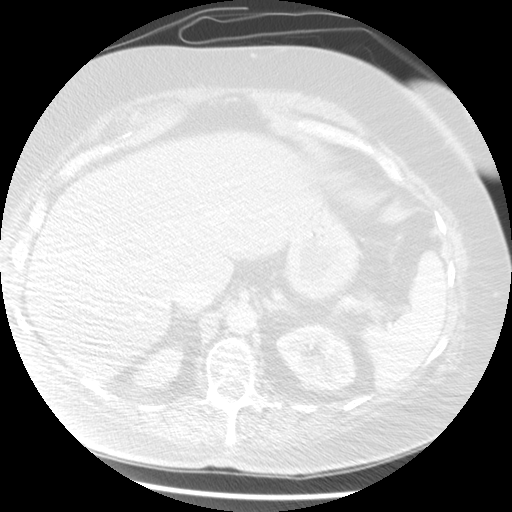
[im 15/64  lung]
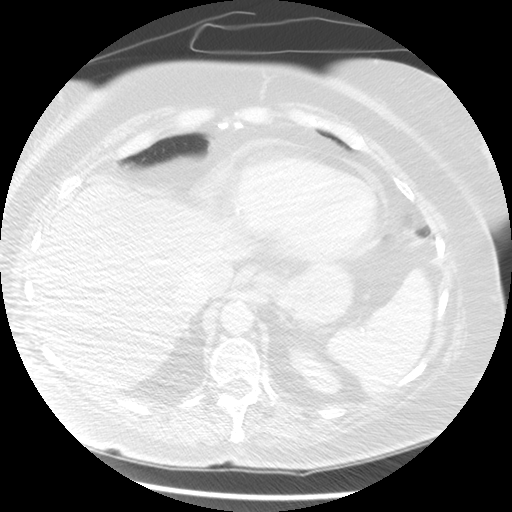
[im 19/64  lung]
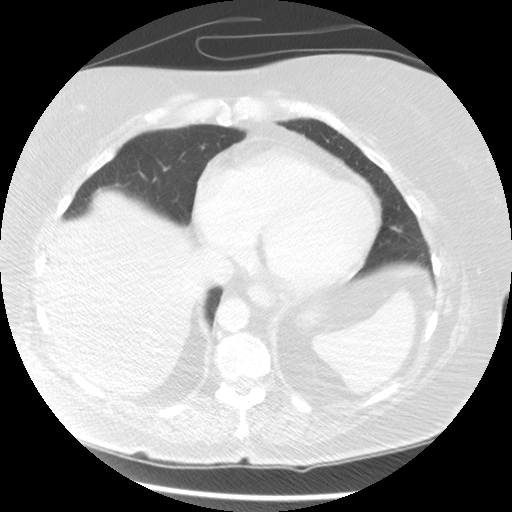
[im 24/64  mediastinal]
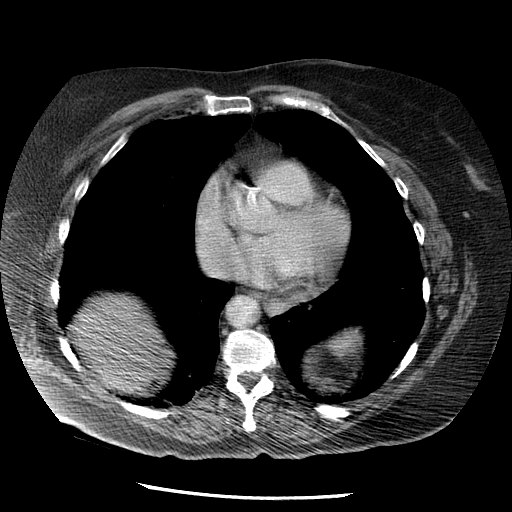
[im 24/64  lung]
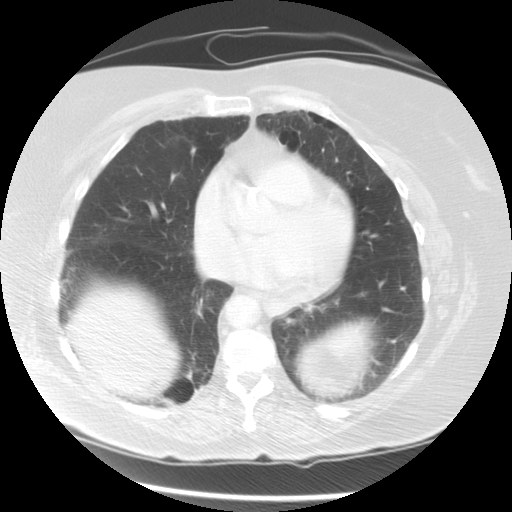
[im 29/64  lung]
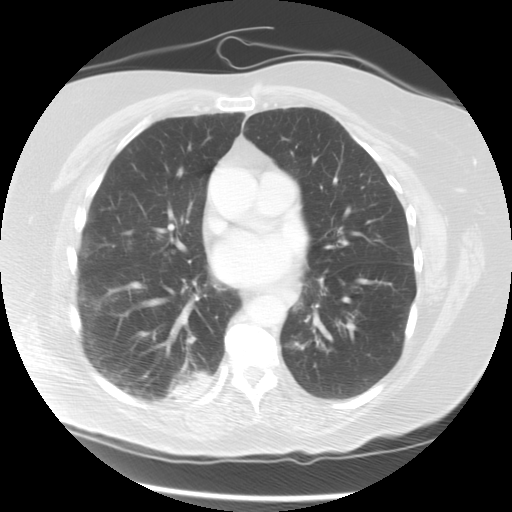
[im 36/64  lung]
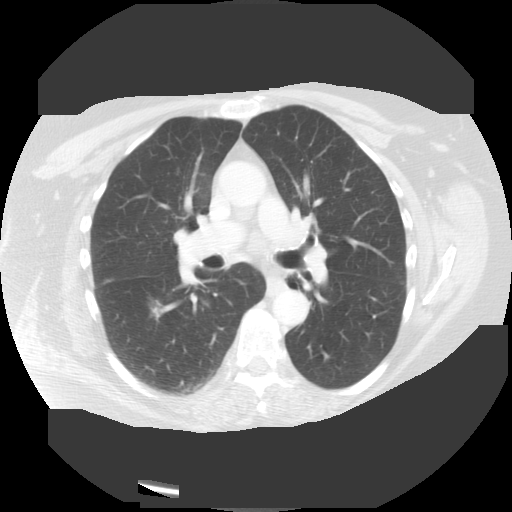
[im 40/64  lung]
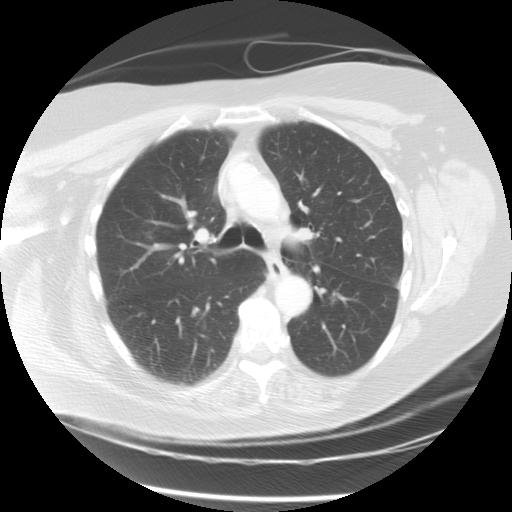
[im 45/64  mediastinal]
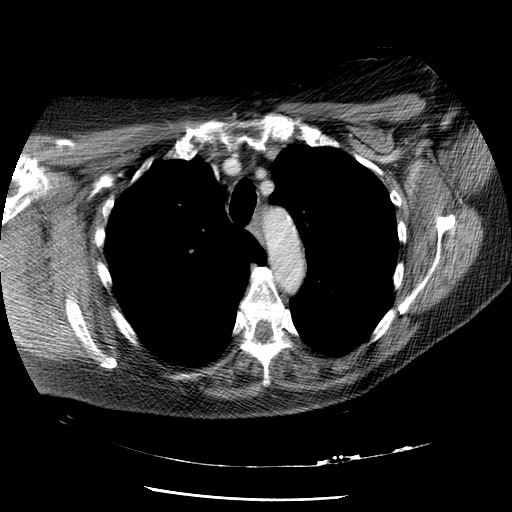
[im 45/64  lung]
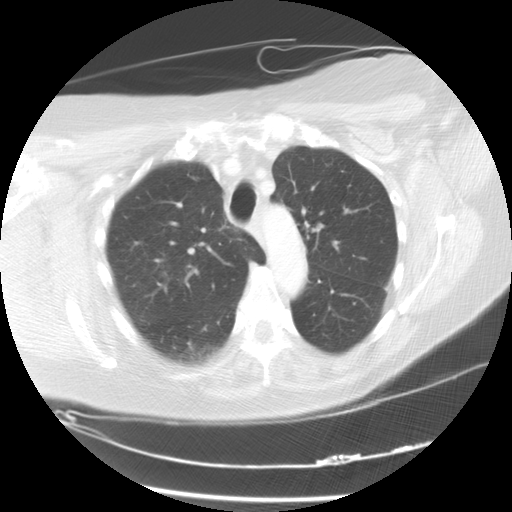
[im 50/64  lung]
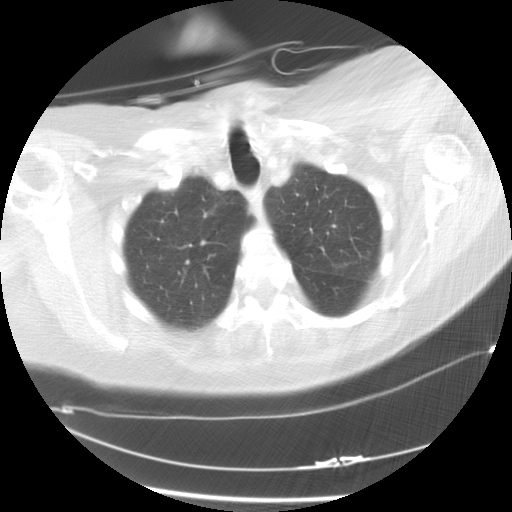
[im 54/64  lung]
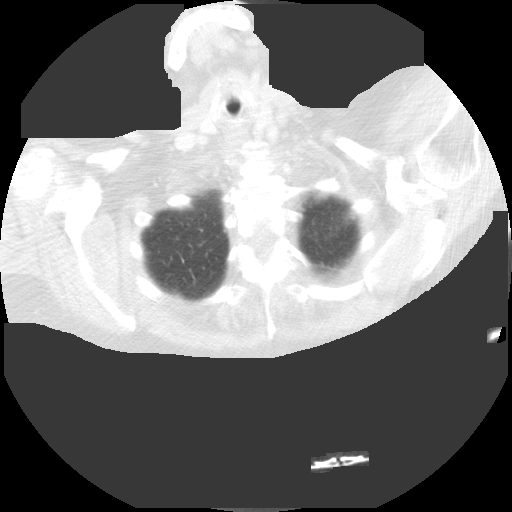
[im 59/64  lung]
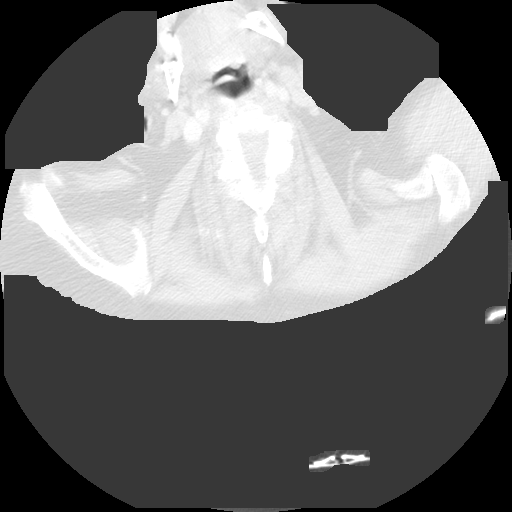

[Series 400: cor · coronal · 0.70mm/px · 3 of 142 slices shown]
[im 29/142  lung]
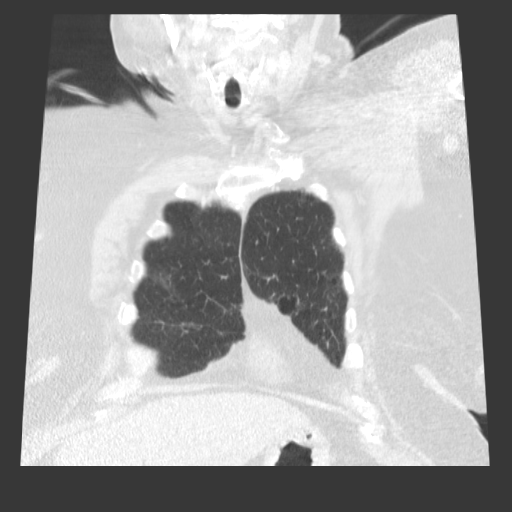
[im 57/142  lung]
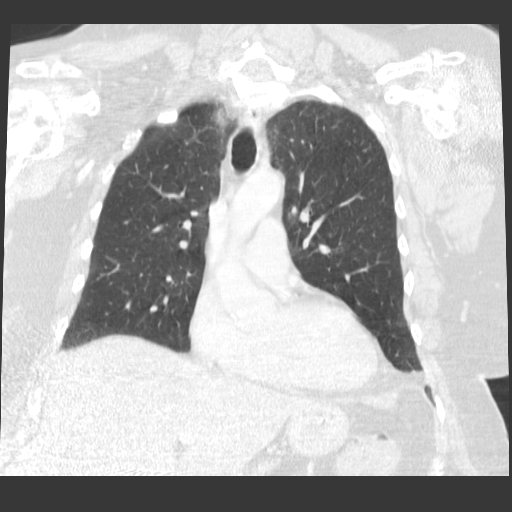
[im 85/142  lung]
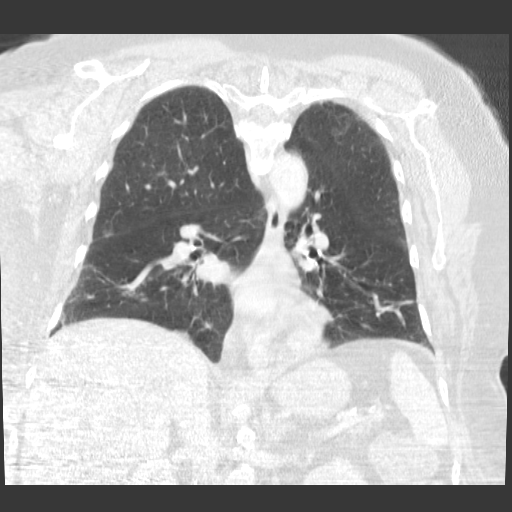

[15 of 36 positions shown; findings below may reference images not displayed]

FINDINGS: The poorly defined nodular appearing densities at the right lung
apex on the CT scan of 12/31/2013 have completely resolved. A focal
area of infiltrate at the right lung base posteriorly on the prior
study has almost completely resolved.

There is a 3 mm nodule in the right lower lobe on image 27 of series
3 which is unchanged since 09/12/2009.

There is no hilar or mediastinal adenopathy. Moderate coronary
artery calcification. The visualized portion of the upper abdomen
demonstrates no acute abnormalities. No acute osseous abnormality.
Marked accentuation of the thoracic kyphosis is stable.

The 6 mm nodule(image 34 series 3) in the anterior aspect of the
left upper lobe seen on the prior study is less prominent than on
the prior exam. (there is an error in the description of this nodule
on the prior report which states that this nodule is 6 x 6 cm. The
nodule is 6 x 6 mm.

There diffuse slight emphysematous changes with slight linear
scarring and atelectasis of the lung bases.
IMPRESSION: 1. Complete clearing of the right lung apex.
2. Almost complete clearing of the infiltrate at the right base
posteriorly.
3. 6 mm nodule in the left upper lobe appears improved. Although the
size has not changed there is now an area of lucency in the
periphery of the nodule.
If the patient is at high risk for bronchogenic carcinoma, follow-up
chest CT at 6-12 months is recommended. If the patient is at low
risk for bronchogenic carcinoma, follow-up chest CT at 12 months is
recommended. This recommendation follows the consensus statement:
Guidelines for Management of Small Pulmonary Nodules Detected on CT
Scans: A Statement from the [HOSPITAL] as published in

## 2014-04-24 ENCOUNTER — Other Ambulatory Visit: Payer: Self-pay | Admitting: *Deleted

## 2014-04-24 MED ORDER — OXYCODONE HCL ER 10 MG PO T12A: EXTENDED_RELEASE_TABLET | ORAL | Status: DC

## 2014-04-24 NOTE — Telephone Encounter (Signed)
[facility] 

## 2014-05-01 ENCOUNTER — Encounter: Payer: Self-pay | Admitting: Internal Medicine

## 2014-05-01 ENCOUNTER — Other Ambulatory Visit: Payer: Self-pay | Admitting: *Deleted

## 2014-05-01 ENCOUNTER — Non-Acute Institutional Stay (SKILLED_NURSING_FACILITY): Payer: Medicare Other | Admitting: Internal Medicine

## 2014-05-01 DIAGNOSIS — B373 Candidiasis of vulva and vagina: Secondary | ICD-10-CM

## 2014-05-01 DIAGNOSIS — B3731 Acute candidiasis of vulva and vagina: Secondary | ICD-10-CM

## 2014-05-01 DIAGNOSIS — Z993 Dependence on wheelchair: Secondary | ICD-10-CM

## 2014-05-01 MED ORDER — HYDROCODONE-ACETAMINOPHEN 5-325 MG PO TABS: 1.0000 | ORAL_TABLET | Freq: Four times a day (QID) | ORAL | Status: DC | PRN

## 2014-05-01 NOTE — Telephone Encounter (Signed)
[facility] 

## 2014-05-01 NOTE — Progress Notes (Signed)
MRN: [id number] Name: [name]  Sex: female Age: 73 y.o. DOB: 04/24/1941  [place] #: [facility] Facility/Room: 217 Level Of Care: SNF Provider: Hennie Duos Emergency Contacts: Extended Emergency Contact Information Primary Emergency Contact: Wright,[name] Address: [address], [place] [address] [place] Work Phone: [phone] Mobile Phone: [phone] Relation: Daughter Secondary Emergency Contact: Rachelle Hora [place] Phone: [phone] Relation: Sister  Code Status: FULL  Allergies: Morphine and related; Olanzapine; and Sulfamethoxazole-trimethoprim  Chief Complaint  Patient presents with  . Acute Visit    HPI: Patient is 73 y.o. female who is being seen for vaginal yeast.  Past Medical History  Diagnosis Date  . Anxiety   . Arthritis   . COPD (chronic obstructive pulmonary disease)   . GERD (gastroesophageal reflux disease)   . Hyperlipidemia   . Osteoporosis   . DVT (deep venous thrombosis)   . PE (pulmonary thromboembolism)   . Hypertension      PT DENIES....ON NO  MEDS  . Diabetes mellitus   . Cancer 1969    cervical  . Pneumonia     h/o  . Bronchitis     h/o  . Blood transfusion   . Anemia   . Bipolar affect, depressed     Past Surgical History  Procedure Laterality Date  . Vena cava filter placement    . 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 Eye surgery      CATARACTS  . Achilles tendon lengthening  11/18/11    and repair w/posterior tibial tendon lengthening; right  foot  . Cholecystectomy    . Abdominal hysterectomy  1969    "womb taken out for cervical cancer"  . Cataract extraction, bilateral  ~ 2008  . Tonsillectomy      "as a child"  . Achilles tendon surgery  11/18/2011    Procedure: ACHILLES TENDON REPAIR;  Surgeon: Wylene Simmer, MD;  Location: [facility];  Service: Orthopedics;  Laterality: Right;  Right Posterior Tibial Tendon Lenghtening and Tendon Achilles  Lenghtening       Medication List       This list is accurate as of: 05/01/14 12:14 PM.  Always use your most recent med list.               acetaminophen 325 MG tablet  Commonly known as:  TYLENOL  Take 650 mg by mouth every 4 (four) hours as needed for mild pain.     ALPRAZolam 1 MG tablet  Commonly known as:  XANAX  Take one tablet by mouth three times daily     antiseptic oral rinse Liqd  15 mLs by Mouth Rinse route every 4 (four) hours as needed for dry mouth.     aspirin 81 MG chewable tablet  Chew 81 mg by mouth daily.     bisacodyl 10 MG suppository  Commonly known as:  DULCOLAX  Place 10 mg rectally 3 (three) times a week. On Monday, Wednesday, and Friday     calcium & magnesium carbonates 311-232 MG per tablet  Commonly known as:  MYLANTA  Take 1 tablet by mouth as needed for heartburn.     DITROPAN XL 10 MG 24 hr tablet  Generic drug:  oxybutynin  Take 10 mg by mouth at bedtime.     feeding supplement (GLUCERNA SHAKE) Liqd  Take 237 mLs by mouth 3 (three)  times daily between meals.     fish oil-omega-3 fatty acids 1000 MG capsule  Take 1 g by mouth 2 (two) times daily.     Fluticasone-Salmeterol 250-50 MCG/DOSE Aepb  Commonly known as:  ADVAIR DISKUS  Inhale 1 puff into the lungs 2 (two) times daily.     gabapentin 300 MG capsule  Commonly known as:  NEURONTIN  Take 3 capsules (900 mg total) by mouth 3 (three) times daily.     HYDROcodone-acetaminophen 5-325 MG per tablet  Commonly known as:  NORCO/VICODIN  Take 1 tablet by mouth every 6 (six) hours as needed for moderate pain.     insulin aspart 100 UNIT/ML FlexPen  Commonly known as:  NOVOLOG  Inject 3-5 Units into the skin 3 (three) times daily before meals. Give 3 units before meals with an additional 5 units for CBG> or < 150     insulin glargine 100 UNIT/ML injection  Commonly known as:  LANTUS  Inject 0.22 mLs (22 Units total) into the skin daily.     levalbuterol 1.25 MG/0.5ML  nebulizer solution  Commonly known as:  XOPENEX  Take 1.25 mg by nebulization 4 (four) times daily.     levalbuterol 45 MCG/ACT inhaler  Commonly known as:  XOPENEX HFA  Inhale 1-2 puffs into the lungs every 4 (four) hours as needed for wheezing.     metFORMIN 1000 MG tablet  Commonly known as:  GLUCOPHAGE  Take 1,000 mg by mouth daily before supper.     metoprolol tartrate 25 MG tablet  Commonly known as:  LOPRESSOR  Take 1 tablet (25 mg total) by mouth 2 (two) times daily.     MILK OF MAGNESIA PO  Take 30 mLs by mouth See admin instructions. If no BM in 3 days, give 82ml in a 24 hours PRN.     MYLANTA PO  Take 30 mLs by mouth every 4 (four) hours as needed (indigestion).     nystatin 100000 UNIT/ML suspension  Commonly known as:  MYCOSTATIN  Take 5 mLs (500,000 Units total) by mouth 4 (four) times daily.     omeprazole 20 MG tablet  Commonly known as:  PRILOSEC OTC  Take 20 mg by mouth 2 (two) times daily.     OxyCODONE 10 mg T12a 12 hr tablet  Commonly known as:  OXYCONTIN  Take one tablet by mouth every 12 hours. Do not crush     polyethylene glycol packet  Commonly known as:  MIRALAX / GLYCOLAX  Take 17 g by mouth daily.     potassium chloride 10 MEQ tablet  Commonly known as:  K-DUR,KLOR-CON  Take 5 mEq by mouth every evening.     predniSONE 10 MG tablet  Commonly known as:  DELTASONE  Take 1 tablet (10 mg total) by mouth daily with breakfast. 2 additional days     promethazine 25 MG tablet  Commonly known as:  PHENERGAN  Take 25 mg by mouth every 6 (six) hours as needed. nausea     QUEtiapine 200 MG tablet  Commonly known as:  SEROQUEL  Take 200 mg by mouth at bedtime.     REFRESH LACRI-LUBE Oint  Place 1 application into both eyes at bedtime.     simvastatin 10 MG tablet  Commonly known as:  ZOCOR  Take 10 mg by mouth at bedtime.     sodium phosphate enema  Commonly known as:  FLEET  Place 1 enema rectally daily as needed (constipation). follow  package  directions     tiotropium 18 MCG inhalation capsule  Commonly known as:  SPIRIVA  Place 1 capsule (18 mcg total) into inhaler and inhale daily.     torsemide 20 MG tablet  Commonly known as:  DEMADEX  Take 40 mg by mouth every morning.     traZODone 50 MG tablet  Commonly known as:  DESYREL  Take 25 mg by mouth daily as needed (anxiety).     TRIMETHOPRIM HCL PO  Take 100 mg by mouth daily.     UTI-STAT Liqd  Take by mouth as needed.     venlafaxine XR 150 MG 24 hr capsule  Commonly known as:  EFFEXOR-XR  Take 150 mg by mouth every morning.        No orders of the defined types were placed in this encounter.    Immunization History  Administered Date(s) Administered  . Influenza Split 10/20/2011  . Influenza Whole 10/05/2007, 09/16/2008, 11/17/2010, 10/01/2013  . Pneumococcal Polysaccharide-23 10/21/2003, 11/17/2010, 01/27/2012  . Td 09/20/2003    History  Substance Use Topics  . Smoking status: Former Smoker -- 1.00 packs/day    Types: Cigarettes    Quit date: 11/14/1986  . Smokeless tobacco: Never Used     Comment: pt cannot recall the total amt of time she smoked.  . Alcohol Use: No    Review of Systems  DATA OBTAINED: from patient, nurse GENERAL: Feels well no fevers, fatigue, appetite changes SKIN: No itching, rash HEENT: No complaint RESPIRATORY: No cough, wheezing, SOB CARDIAC: No chest pain, palpitations, lower extremity edema  GI: No abdominal pain, No N/V/D or constipation, No heartburn or reflux  GU: + dysuria; per nurse heavy growth yeast MUSCULOSKELETAL: No unrelieved bone/joint pain NEUROLOGIC: No headache, dizziness PSYCHIATRIC: No overt anxiety or sadness. Sleeps well.   Filed Vitals:   05/01/14 1104  BP: 126/70  Pulse: 100  Temp: 97.7 F (36.5 C)  Resp: 20    Physical Exam  GENERAL APPEARANCE: Alert, conversant. Appropriately groomed.WF screaming at the top of her lungs SKIN: No diaphoresis rash, or wounds HEENT:  Unremarkable RESPIRATORY: Breathing is even, unlabored. Lung sounds are clear   CARDIOVASCULAR: Heart RRR no murmurs, rubs or gallops. No peripheral edema  GASTROINTESTINAL: Abdomen is soft, non-tender, not distended w/ normal bowel sounds.  GENITOURINARY: Bladder non tender, not distended  MUSCULOSKELETAL: No abnormal joints or musculature NEUROLOGIC: Cranial nerves 2-12 grossly intact.  PSYCHIATRIC: a little upset now  Patient Active Problem List   Diagnosis Date Noted  . MRSA cellulitis 02/20/2014  . Tardive dyskinesia 02/10/2014  . Infection due to ESBL-producing Escherichia coli 02/02/2014  . Sepsis 01/21/2014  . Acute respiratory failure with hypoxia 01/21/2014  . Fever, unspecified 01/21/2014  . Septic shock 12/25/2013  . UTI 12/25/2013  . Acute encephalopathy 12/25/2013  . Pneumonia, organism unspecified 12/21/2013  . Cough 12/21/2013  . Diarrhea 12/21/2013  . Diaper candidiasis 12/17/2013  . Dysuria 09/17/2013  . Type II or unspecified type diabetes mellitus with neurological manifestations, not stated as uncontrolled(250.60) 08/14/2013  . Pure hyperglyceridemia 06/18/2013  . Type I (juvenile type) diabetes mellitus with neurological manifestations, uncontrolled(250.63) 05/04/2013  . Chronic pain syndrome 05/04/2013  . Unspecified constipation 04/04/2013  . Venous stasis dermatitis 04/03/2012  . COPD exacerbation 02/21/2012  . Tachycardia 12/27/2011  . Thrush 11/10/2011  . Ankle arthritis 07/28/2011  . Chronic right shoulder pain 06/03/2011  . Leg swelling 03/03/2011  . INSOMNIA, CHRONIC 11/17/2010  . Other chronic pain 11/17/2010  . GREENFIELD FILTER  INSERTION, HX OF 11/17/2010  . NEOPLASM UNCERTAIN BEHAVIOR MAJOR SALIV GLANDS 11/10/2009  . PULMONARY EMBOLISM 10/02/2009  . DEEP VENOUS THROMBOPHLEBITIS, LEG, LEFT 10/02/2009  . Pulmonary nodules 09/12/2009  . Wheelchair dependence 04/10/2009  . COR PULMONALE 05/22/2008  . VENOUS INSUFFICIENCY, CHRONIC 03/25/2008   . SYMPTOM, INCONTINENCE, URINARY NOS 07/13/2007  . DIABETES MELLITUS, TYPE II 05/11/2007  . ANEMIA-NOS 05/11/2007  . GERD 05/11/2007  . HYPERLIPIDEMIA 02/16/2007  . OBESITY, NOS 02/16/2007  . BIPOLAR DISORDER 02/16/2007  . ANXIETY 02/16/2007  . HYPERTENSION, BENIGN SYSTEMIC 02/16/2007  . COPD (chronic obstructive pulmonary disease) with emphysema 02/16/2007  . OSTEOPENIA 02/16/2007  . TOBACCO ABUSE, HX OF 02/16/2007    CBC    Component Value Date/Time   WBC 7.1 02/05/2014 0347   RBC 3.29* 02/05/2014 0347   HGB 9.5* 02/05/2014 0347   HCT 30.0* 02/05/2014 0347   PLT 404* 02/05/2014 0347   MCV 91.2 02/05/2014 0347   LYMPHSABS 2.9 02/05/2014 0347   MONOABS 0.6 02/05/2014 0347   EOSABS 0.2 02/05/2014 0347   BASOSABS 0.1 02/05/2014 0347    CMP     Component Value Date/Time   NA 140 02/05/2014 0347   K 3.3* 02/05/2014 0347   CL 100 02/05/2014 0347   CO2 32 02/05/2014 0347   GLUCOSE 215* 02/05/2014 0347   BUN 11 02/05/2014 0347   CREATININE 0.40* 02/05/2014 0347   CREATININE 0.73 03/24/2011 1614   CALCIUM 8.4 02/05/2014 0347   PROT 7.1 01/21/2014 1139   ALBUMIN 3.2* 01/21/2014 1139   AST 30 01/21/2014 1139   ALT 14 01/21/2014 1139   ALKPHOS 57 01/21/2014 1139   BILITOT 0.2* 01/21/2014 1139   GFRNONAA >90 02/05/2014 0347   GFRAA >90 02/05/2014 0347    Assessment and Plan  VAGINAL YEAST- diflucan 150 mg po q 72 hrs time 3 doses; extra doses because it it severe and because pt is in [facility] or bed, non amnulatory. Nurses can use nystatin cream Prn comfort while diflucan takes time to work  Hennie Duos, MD

## 2014-05-03 ENCOUNTER — Encounter: Payer: Self-pay | Admitting: Internal Medicine

## 2014-05-03 ENCOUNTER — Non-Acute Institutional Stay (SKILLED_NURSING_FACILITY): Payer: Medicare Other | Admitting: Internal Medicine

## 2014-05-03 DIAGNOSIS — F319 Bipolar disorder, unspecified: Secondary | ICD-10-CM

## 2014-05-03 DIAGNOSIS — L22 Diaper dermatitis: Secondary | ICD-10-CM

## 2014-05-03 DIAGNOSIS — L03039 Cellulitis of unspecified toe: Secondary | ICD-10-CM

## 2014-05-03 DIAGNOSIS — L03031 Cellulitis of right toe: Secondary | ICD-10-CM

## 2014-05-03 DIAGNOSIS — B372 Candidiasis of skin and nail: Secondary | ICD-10-CM

## 2014-05-03 DIAGNOSIS — L02619 Cutaneous abscess of unspecified foot: Secondary | ICD-10-CM

## 2014-05-03 NOTE — Assessment & Plan Note (Signed)
Daughter thinks that the Norco5/325 2 pills q6 prn is causing pt increased aggitation. Nursing says she uses it a lot, probably without real need. Will d/c norco and substitute Ultram instead 50 mg q6 prn.

## 2014-05-03 NOTE — Progress Notes (Signed)
MRN: [id number] Name: [name]  Sex: female Age: 73 y.o. DOB: Jul 19, 1941  [place] #: [facility] Facility/Room: 217 Level Of Care: SNF Provider: Hennie Duos Emergency Contacts: Extended Emergency Contact Information Primary Emergency Contact: Wright,[name] Address: [address], [place] [address] [place] Work Phone: [phone] Mobile Phone: [phone] Relation: Daughter Secondary Emergency Contact: Rachelle Hora [place] Phone: [phone] Relation: Sister  Code Status: FULL  Allergies: Morphine and related; Olanzapine; and Sulfamethoxazole-trimethoprim  Chief Complaint  Patient presents with  . Medical Management of Chronic Issues    HPI: Patient is 73 y.o. female who is being seen for daughters concern over yeast and for daughter's concern that pt's Norco is causing her to be more agitated. Also pt has new c/o of sore big toe.  Past Medical History  Diagnosis Date  . Anxiety   . Arthritis   . COPD (chronic obstructive pulmonary disease)   . GERD (gastroesophageal reflux disease)   . Hyperlipidemia   . Osteoporosis   . DVT (deep venous thrombosis)   . PE (pulmonary thromboembolism)   . Hypertension      PT DENIES....ON NO  MEDS  . Diabetes mellitus   . Cancer 1969    cervical  . Pneumonia     h/o  . Bronchitis     h/o  . Blood transfusion   . Anemia   . Bipolar affect, depressed     Past Surgical History  Procedure Laterality Date  . Vena cava filter placement    . 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 Eye surgery      CATARACTS  . Achilles tendon lengthening  11/18/11    and repair w/posterior tibial tendon lengthening; right  foot  . Cholecystectomy    . Abdominal hysterectomy  1969    "womb taken out for cervical cancer"  . Cataract extraction, bilateral  ~ 2008  . Tonsillectomy      "as a child"  . Achilles tendon surgery  11/18/2011    Procedure: ACHILLES TENDON REPAIR;   Surgeon: Wylene Simmer, MD;  Location: [facility];  Service: Orthopedics;  Laterality: Right;  Right Posterior Tibial Tendon Lenghtening and Tendon Achilles Lenghtening       Medication List       This list is accurate as of: 05/03/14  1:58 PM.  Always use your most recent med list.               acetaminophen 325 MG tablet  Commonly known as:  TYLENOL  Take 650 mg by mouth every 4 (four) hours as needed for mild pain.     ALPRAZolam 1 MG tablet  Commonly known as:  XANAX  Take one tablet by mouth three times daily     antiseptic oral rinse Liqd  15 mLs by Mouth Rinse route every 4 (four) hours as needed for dry mouth.     aspirin 81 MG chewable tablet  Chew 81 mg by mouth daily.     bisacodyl 10 MG suppository  Commonly known as:  DULCOLAX  Place 10 mg rectally 3 (three) times a week. On Monday, Wednesday, and Friday     calcium & magnesium carbonates 311-232 MG per tablet  Commonly known as:  MYLANTA  Take 1 tablet by mouth as needed for heartburn.     DITROPAN XL 10 MG 24 hr tablet  Generic drug:  oxybutynin  Take 10 mg by mouth at bedtime.     feeding supplement (GLUCERNA SHAKE) Liqd  Take 237 mLs by mouth 3 (three) times daily between meals.     fish oil-omega-3 fatty acids 1000 MG capsule  Take 1 g by mouth 2 (two) times daily.     Fluticasone-Salmeterol 250-50 MCG/DOSE Aepb  Commonly known as:  ADVAIR DISKUS  Inhale 1 puff into the lungs 2 (two) times daily.     gabapentin 300 MG capsule  Commonly known as:  NEURONTIN  Take 3 capsules (900 mg total) by mouth 3 (three) times daily.     HYDROcodone-acetaminophen 5-325 MG per tablet  Commonly known as:  NORCO/VICODIN  Take 1 tablet by mouth every 6 (six) hours as needed for moderate pain.     insulin aspart 100 UNIT/ML FlexPen  Commonly known as:  NOVOLOG  Inject 3-5 Units into the skin 3 (three) times daily before meals. Give 3 units before meals with an additional 5 units for CBG> or < 150     insulin  glargine 100 UNIT/ML injection  Commonly known as:  LANTUS  Inject 0.22 mLs (22 Units total) into the skin daily.     levalbuterol 1.25 MG/0.5ML nebulizer solution  Commonly known as:  XOPENEX  Take 1.25 mg by nebulization 4 (four) times daily.     levalbuterol 45 MCG/ACT inhaler  Commonly known as:  XOPENEX HFA  Inhale 1-2 puffs into the lungs every 4 (four) hours as needed for wheezing.     metFORMIN 1000 MG tablet  Commonly known as:  GLUCOPHAGE  Take 1,000 mg by mouth daily before supper.     metoprolol tartrate 25 MG tablet  Commonly known as:  LOPRESSOR  Take 1 tablet (25 mg total) by mouth 2 (two) times daily.     MILK OF MAGNESIA PO  Take 30 mLs by mouth See admin instructions. If no BM in 3 days, give 27ml in a 24 hours PRN.     MYLANTA PO  Take 30 mLs by mouth every 4 (four) hours as needed (indigestion).     nystatin 100000 UNIT/ML suspension  Commonly known as:  MYCOSTATIN  Take 5 mLs (500,000 Units total) by mouth 4 (four) times daily.     omeprazole 20 MG tablet  Commonly known as:  PRILOSEC OTC  Take 20 mg by mouth 2 (two) times daily.     OxyCODONE 10 mg T12a 12 hr tablet  Commonly known as:  OXYCONTIN  Take one tablet by mouth every 12 hours. Do not crush     polyethylene glycol packet  Commonly known as:  MIRALAX / GLYCOLAX  Take 17 g by mouth daily.     potassium chloride 10 MEQ tablet  Commonly known as:  K-DUR,KLOR-CON  Take 5 mEq by mouth every evening.     predniSONE 10 MG tablet  Commonly known as:  DELTASONE  Take 1 tablet (10 mg total) by mouth daily with breakfast. 2 additional days     promethazine 25 MG tablet  Commonly known as:  PHENERGAN  Take 25 mg by mouth every 6 (six) hours as needed. nausea     QUEtiapine 200 MG tablet  Commonly known as:  SEROQUEL  Take 200 mg by mouth at bedtime.     REFRESH LACRI-LUBE Oint  Place 1 application into both eyes at bedtime.     simvastatin 10 MG tablet  Commonly known as:  ZOCOR  Take  10  mg by mouth at bedtime.     sodium phosphate enema  Commonly known as:  FLEET  Place 1 enema rectally daily as needed (constipation). follow package directions     tiotropium 18 MCG inhalation capsule  Commonly known as:  SPIRIVA  Place 1 capsule (18 mcg total) into inhaler and inhale daily.     torsemide 20 MG tablet  Commonly known as:  DEMADEX  Take 40 mg by mouth every morning.     traZODone 50 MG tablet  Commonly known as:  DESYREL  Take 25 mg by mouth daily as needed (anxiety).     TRIMETHOPRIM HCL PO  Take 100 mg by mouth daily.     UTI-STAT Liqd  Take by mouth as needed.     venlafaxine XR 150 MG 24 hr capsule  Commonly known as:  EFFEXOR-XR  Take 150 mg by mouth every morning.        No orders of the defined types were placed in this encounter.    Immunization History  Administered Date(s) Administered  . Influenza Split 10/20/2011  . Influenza Whole 10/05/2007, 09/16/2008, 11/17/2010, 10/01/2013  . Pneumococcal Polysaccharide-23 10/21/2003, 11/17/2010, 01/27/2012  . Td 09/20/2003    History  Substance Use Topics  . Smoking status: Former Smoker -- 1.00 packs/day    Types: Cigarettes    Quit date: 11/14/1986  . Smokeless tobacco: Never Used     Comment: pt cannot recall the total amt of time she smoked.  . Alcohol Use: No    Review of Systems  DATA OBTAINED: from patient, nurse family member GENERAL: Feels well no fevers, fatigue, appetite changes SKIN: No itching, rash HEENT: No complaint RESPIRATORY: No cough, wheezing, SOB CARDIAC: No chest pain, palpitations, lower extremity edema  GI: No abdominal pain, No N/V/D or constipation, No heartburn or reflux  JJ:HERDE to pe with yeast;daughter would like the nystatin 3 times a day  MUSCULOSKELETAL:sore big toe NEUROLOGIC: No headache, dizziness or focal weakness PSYCHIATRIC: No overt anxiety or sadness   Filed Vitals:   05/03/14 1255  BP: 126/70  Pulse: 94  Temp: 97.1 F (36.2 C)   Resp: 20    Physical Exam  GENERAL APPEARANCE: Alert, conversant. Appropriately groomed. No acute distress; pt is not yelling out today,is much more comfortable than 2 days ago HEENT: Unremarkable RESPIRATORY: Breathing is even, unlabored. Lung sounds are clear   CARDIOVASCULAR: Heart RRR no murmurs, rubs or gallops. No peripheral edema  GASTROINTESTINAL: Abdomen is soft, non-tender, not distended w/ normal bowel sounds.  GENITOURINARY: Bladder non tender, not distended  MUSCULOSKELETAL: brace, shoe and stocking removed-R great toe red and swollen, no fluctuance NEUROLOGIC: Cranial nerves 2-12 grossly intact. Moves all extremities PSYCHIATRIC: Mood and affect appropriate to situation, no behavioral issues  Patient Active Problem List   Diagnosis Date Noted  . MRSA cellulitis 02/20/2014  . Tardive dyskinesia 02/10/2014  . Infection due to ESBL-producing Escherichia coli 02/02/2014  . Sepsis 01/21/2014  . Acute respiratory failure with hypoxia 01/21/2014  . Fever, unspecified 01/21/2014  . Septic shock 12/25/2013  . UTI 12/25/2013  . Acute encephalopathy 12/25/2013  . Pneumonia, organism unspecified 12/21/2013  . Cough 12/21/2013  . Diarrhea 12/21/2013  . Diaper candidiasis 12/17/2013  . Dysuria 09/17/2013  . Type II or unspecified type diabetes mellitus with neurological manifestations, not stated as uncontrolled(250.60) 08/14/2013  . Pure hyperglyceridemia 06/18/2013  . Type I (juvenile type) diabetes mellitus with neurological manifestations, uncontrolled(250.63) 05/04/2013  . Chronic pain syndrome 05/04/2013  . Unspecified  constipation 04/04/2013  . Venous stasis dermatitis 04/03/2012  . COPD exacerbation 02/21/2012  . Tachycardia 12/27/2011  . Thrush 11/10/2011  . Ankle arthritis 07/28/2011  . Chronic right shoulder pain 06/03/2011  . Leg swelling 03/03/2011  . INSOMNIA, CHRONIC 11/17/2010  . Other chronic pain 11/17/2010  . GREENFIELD FILTER INSERTION, HX OF  11/17/2010  . NEOPLASM UNCERTAIN BEHAVIOR MAJOR SALIV GLANDS 11/10/2009  . PULMONARY EMBOLISM 10/02/2009  . DEEP VENOUS THROMBOPHLEBITIS, LEG, LEFT 10/02/2009  . Pulmonary nodules 09/12/2009  . Wheelchair dependence 04/10/2009  . COR PULMONALE 05/22/2008  . VENOUS INSUFFICIENCY, CHRONIC 03/25/2008  . SYMPTOM, INCONTINENCE, URINARY NOS 07/13/2007  . DIABETES MELLITUS, TYPE II 05/11/2007  . ANEMIA-NOS 05/11/2007  . GERD 05/11/2007  . HYPERLIPIDEMIA 02/16/2007  . OBESITY, NOS 02/16/2007  . BIPOLAR DISORDER 02/16/2007  . ANXIETY 02/16/2007  . HYPERTENSION, BENIGN SYSTEMIC 02/16/2007  . COPD (chronic obstructive pulmonary disease) with emphysema 02/16/2007  . OSTEOPENIA 02/16/2007  . TOBACCO ABUSE, HX OF 02/16/2007    CBC    Component Value Date/Time   WBC 7.1 02/05/2014 0347   RBC 3.29* 02/05/2014 0347   HGB 9.5* 02/05/2014 0347   HCT 30.0* 02/05/2014 0347   PLT 404* 02/05/2014 0347   MCV 91.2 02/05/2014 0347   LYMPHSABS 2.9 02/05/2014 0347   MONOABS 0.6 02/05/2014 0347   EOSABS 0.2 02/05/2014 0347   BASOSABS 0.1 02/05/2014 0347    CMP     Component Value Date/Time   NA 140 02/05/2014 0347   K 3.3* 02/05/2014 0347   CL 100 02/05/2014 0347   CO2 32 02/05/2014 0347   GLUCOSE 215* 02/05/2014 0347   BUN 11 02/05/2014 0347   CREATININE 0.40* 02/05/2014 0347   CREATININE 0.73 03/24/2011 1614   CALCIUM 8.4 02/05/2014 0347   PROT 7.1 01/21/2014 1139   ALBUMIN 3.2* 01/21/2014 1139   AST 30 01/21/2014 1139   ALT 14 01/21/2014 1139   ALKPHOS 57 01/21/2014 1139   BILITOT 0.2* 01/21/2014 1139   GFRNONAA >90 02/05/2014 0347   GFRAA >90 02/05/2014 0347    Assessment and Plan  Diaper candidiasis Pt is getting diflucan 150 mg q 72 hours  For 3 pills. Wilkl increase the nystatin to area to TID per daughter request  BIPOLAR DISORDER Daughter thinks that the Norco5/325 2 pills q6 prn is causing pt increased aggitation. Nursing says she uses it a lot, probably without real need. Will d/c norco and substitute  Ultram instead 50 mg q6 prn.  CELLULITIS R GREAT TOE - SOCK AND DIFFERENT SHOE UNTIL RESOLVES;DOXY 100 MG BID FOR 7 DAYS  Hennie Duos, MD

## 2014-05-03 NOTE — Assessment & Plan Note (Signed)
Pt is getting diflucan 150 mg q 72 hours  For 3 pills. Wilkl increase the nystatin to area to TID per daughter request

## 2014-05-06 ENCOUNTER — Non-Acute Institutional Stay (SKILLED_NURSING_FACILITY): Payer: Medicare Other | Admitting: Internal Medicine

## 2014-05-06 DIAGNOSIS — J439 Emphysema, unspecified: Secondary | ICD-10-CM

## 2014-05-06 DIAGNOSIS — R059 Cough, unspecified: Secondary | ICD-10-CM

## 2014-05-06 DIAGNOSIS — R05 Cough: Secondary | ICD-10-CM

## 2014-05-06 DIAGNOSIS — J438 Other emphysema: Secondary | ICD-10-CM

## 2014-05-06 NOTE — Progress Notes (Signed)
Patient ID: [name], female   DOB: 1941-10-22, 73 y.o.   MRN: [id number]   This is an acute visit.  Level of care skilled.  Facility AF.  Chief complaint-acute visit secondary to cough.  History of present illness.  Patient is a 73 year old female with complex medical history including severe COPD she is on bronchodilators Xopenex as well as Spiriva and Advair.  She has been complaining of a cough on and off.  We did order a chest x-ray which has come back showing no acute changes.  Her vital signs continued to be stable she is on chronic oxygen as well and is followed by pulmonology.  Family medical social history as been reviewed most recently progress note 04/15/2014.  Medications at been reviewed per [facility].  Review of systems.  In general she is not complaining of fever chills.  Respiratory does complain of cough with some chronic shortness of breath she is oxygen dependent the cough appears to be nonproductive.  Cardiac does not complain of chest pain has some baseline lower extremity edema.  Neurologic is not complaining of any dizziness or headaches.  Physical exam.  . temperature 97.2 pulse 98 respirations 20 blood pressure 112/69 O2 saturation 97% on oxygen  In general this is a somewhat obese elderly female in no distress lying comfortably in bed.  Her skin is warm and dry.  Chest has shallow air entry but no labored breathing slight congestion anterior fields that clears with cough.  Heart distant heart sounds a regular rate and rhythm she has baseline I would say 1+ lower extremity edema.  Labs.  02/22/2014.  Sodium 134 potassium 4.1 BUN 11 creatinine 0.5.  02/28/2014.  WBC 7.2 hemoglobin 10.9 platelets 350.  Assessment and plan.  #1-COPD with history of cough-she is on nebulizers inhalers as well as Spiriva and Advair-chest x-ray did not show any acute changes we'll prescribe Mucinex 600 mg twice a day for 5 days and monitor with vital signs  pulse ox every shift for 72 hours to keep an eye on her.  She clinically appears to be stable although somewhat anxious --- this plan was discussed with her.  Also will update a CBC and CMP for updated laboratory values.  [id number]

## 2014-05-08 ENCOUNTER — Non-Acute Institutional Stay (SKILLED_NURSING_FACILITY): Payer: Medicare Other | Admitting: Internal Medicine

## 2014-05-08 ENCOUNTER — Encounter: Payer: Self-pay | Admitting: Internal Medicine

## 2014-05-08 DIAGNOSIS — L03031 Cellulitis of right toe: Secondary | ICD-10-CM

## 2014-05-08 DIAGNOSIS — L03039 Cellulitis of unspecified toe: Secondary | ICD-10-CM

## 2014-05-08 DIAGNOSIS — M7989 Other specified soft tissue disorders: Secondary | ICD-10-CM

## 2014-05-08 DIAGNOSIS — L02619 Cutaneous abscess of unspecified foot: Secondary | ICD-10-CM

## 2014-05-08 NOTE — Assessment & Plan Note (Signed)
Pt does not have compression stocking on R leg because of toe infection. Pt is wearing a sock and swelling is better where the sock comes on her leg. Swelling is 2/2 no compression stocking. After 1 week pt will be able to return to compression stockings

## 2014-05-08 NOTE — Progress Notes (Signed)
MRN: [id number] Name: [name]  Sex: female Age: 73 y.o. DOB: 1941/08/11  [place] #: [facility] Facility/Room: 217 Level Of Care: SNF Provider: Hennie Duos Emergency Contacts: Extended Emergency Contact Information Primary Emergency Contact: Wright,[name] Address: [id number] [place], [place] [address] [place] Work Phone: [phone] Mobile Phone: [phone] Relation: Daughter Secondary Emergency Contact: Rachelle Hora [place] Phone: [phone] Relation: Sister  Code Status: FULL  Allergies: Morphine and related; Olanzapine; and Sulfamethoxazole-trimethoprim  Chief Complaint  Patient presents with  . Acute Visit    HPI: Patient is 73 y.o. female who is c/o swelling R leg for several days.  Past Medical History  Diagnosis Date  . Anxiety   . Arthritis   . COPD (chronic obstructive pulmonary disease)   . GERD (gastroesophageal reflux disease)   . Hyperlipidemia   . Osteoporosis   . DVT (deep venous thrombosis)   . PE (pulmonary thromboembolism)   . Hypertension      PT DENIES....ON NO  MEDS  . Diabetes mellitus   . Cancer 1969    cervical  . Pneumonia     h/o  . Bronchitis     h/o  . Blood transfusion   . Anemia   . Bipolar affect, depressed     Past Surgical History  Procedure Laterality Date  . Vena cava filter placement    . 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 Eye surgery      CATARACTS  . Achilles tendon lengthening  11/18/11    and repair w/posterior tibial tendon lengthening; right  foot  . Cholecystectomy    . Abdominal hysterectomy  1969    "womb taken out for cervical cancer"  . Cataract extraction, bilateral  ~ 2008  . Tonsillectomy      "as a child"  . Achilles tendon surgery  11/18/2011    Procedure: ACHILLES TENDON REPAIR;  Surgeon: Wylene Simmer, MD;  Location: [facility];  Service: Orthopedics;  Laterality: Right;  Right Posterior Tibial Tendon Lenghtening and Tendon  Achilles Lenghtening       Medication List       This list is accurate as of: 05/08/14 11:06 AM.  Always use your most recent med list.               acetaminophen 325 MG tablet  Commonly known as:  TYLENOL  Take 650 mg by mouth every 4 (four) hours as needed for mild pain.     ALPRAZolam 1 MG tablet  Commonly known as:  XANAX  Take one tablet by mouth three times daily     antiseptic oral rinse Liqd  15 mLs by Mouth Rinse route every 4 (four) hours as needed for dry mouth.     aspirin 81 MG chewable tablet  Chew 81 mg by mouth daily.     bisacodyl 10 MG suppository  Commonly known as:  DULCOLAX  Place 10 mg rectally 3 (three) times a week. On Monday, Wednesday, and Friday     calcium & magnesium carbonates 311-232 MG per tablet  Commonly known as:  MYLANTA  Take 1 tablet by mouth as needed for heartburn.     DITROPAN XL 10 MG 24 hr tablet  Generic drug:  oxybutynin  Take 10 mg by mouth at bedtime.     feeding supplement (GLUCERNA SHAKE) Liqd  Take 237 mLs by mouth  3 (three) times daily between meals.     fish oil-omega-3 fatty acids 1000 MG capsule  Take 1 g by mouth 2 (two) times daily.     Fluticasone-Salmeterol 250-50 MCG/DOSE Aepb  Commonly known as:  ADVAIR DISKUS  Inhale 1 puff into the lungs 2 (two) times daily.     gabapentin 300 MG capsule  Commonly known as:  NEURONTIN  Take 3 capsules (900 mg total) by mouth 3 (three) times daily.     HYDROcodone-acetaminophen 5-325 MG per tablet  Commonly known as:  NORCO/VICODIN  Take 1 tablet by mouth every 6 (six) hours as needed for moderate pain.     insulin aspart 100 UNIT/ML FlexPen  Commonly known as:  NOVOLOG  Inject 3-5 Units into the skin 3 (three) times daily before meals. Give 3 units before meals with an additional 5 units for CBG> or < 150     insulin glargine 100 UNIT/ML injection  Commonly known as:  LANTUS  Inject 0.22 mLs (22 Units total) into the skin daily.     levalbuterol 1.25  MG/0.5ML nebulizer solution  Commonly known as:  XOPENEX  Take 1.25 mg by nebulization 4 (four) times daily.     levalbuterol 45 MCG/ACT inhaler  Commonly known as:  XOPENEX HFA  Inhale 1-2 puffs into the lungs every 4 (four) hours as needed for wheezing.     metFORMIN 1000 MG tablet  Commonly known as:  GLUCOPHAGE  Take 1,000 mg by mouth daily before supper.     metoprolol tartrate 25 MG tablet  Commonly known as:  LOPRESSOR  Take 1 tablet (25 mg total) by mouth 2 (two) times daily.     MILK OF MAGNESIA PO  Take 30 mLs by mouth See admin instructions. If no BM in 3 days, give 24ml in a 24 hours PRN.     MYLANTA PO  Take 30 mLs by mouth every 4 (four) hours as needed (indigestion).     nystatin 100000 UNIT/ML suspension  Commonly known as:  MYCOSTATIN  Take 5 mLs (500,000 Units total) by mouth 4 (four) times daily.     omeprazole 20 MG tablet  Commonly known as:  PRILOSEC OTC  Take 20 mg by mouth 2 (two) times daily.     OxyCODONE 10 mg T12a 12 hr tablet  Commonly known as:  OXYCONTIN  Take one tablet by mouth every 12 hours. Do not crush     polyethylene glycol packet  Commonly known as:  MIRALAX / GLYCOLAX  Take 17 g by mouth daily.     potassium chloride 10 MEQ tablet  Commonly known as:  K-DUR,KLOR-CON  Take 5 mEq by mouth every evening.     predniSONE 10 MG tablet  Commonly known as:  DELTASONE  Take 1 tablet (10 mg total) by mouth daily with breakfast. 2 additional days     promethazine 25 MG tablet  Commonly known as:  PHENERGAN  Take 25 mg by mouth every 6 (six) hours as needed. nausea     QUEtiapine 200 MG tablet  Commonly known as:  SEROQUEL  Take 200 mg by mouth at bedtime.     REFRESH LACRI-LUBE Oint  Place 1 application into both eyes at bedtime.     simvastatin 10 MG tablet  Commonly known as:  ZOCOR  Take 10 mg by mouth at bedtime.     sodium phosphate enema  Commonly known as:  FLEET  Place 1 enema rectally daily as needed (constipation).  follow package directions     tiotropium 18 MCG inhalation capsule  Commonly known as:  SPIRIVA  Place 1 capsule (18 mcg total) into inhaler and inhale daily.     torsemide 20 MG tablet  Commonly known as:  DEMADEX  Take 40 mg by mouth every morning.     traZODone 50 MG tablet  Commonly known as:  DESYREL  Take 25 mg by mouth daily as needed (anxiety).     TRIMETHOPRIM HCL PO  Take 100 mg by mouth daily.     UTI-STAT Liqd  Take by mouth as needed.     venlafaxine XR 150 MG 24 hr capsule  Commonly known as:  EFFEXOR-XR  Take 150 mg by mouth every morning.        No orders of the defined types were placed in this encounter.    Immunization History  Administered Date(s) Administered  . Influenza Split 10/20/2011  . Influenza Whole 10/05/2007, 09/16/2008, 11/17/2010, 10/01/2013  . Pneumococcal Polysaccharide-23 10/21/2003, 11/17/2010, 01/27/2012  . Td 09/20/2003    History  Substance Use Topics  . Smoking status: Former Smoker -- 1.00 packs/day    Types: Cigarettes    Quit date: 11/14/1986  . Smokeless tobacco: Never Used     Comment: pt cannot recall the total amt of time she smoked.  . Alcohol Use: No    Review of Systems  DATA OBTAINED: from patient, nurse GENERAL: Feels well no fevers, fatigue, appetite changes SKIN: No itching, rash; toe feels  better HEENT: No complaint RESPIRATORY: No cough, wheezing, SOB CARDIAC: No chest pain, palpitations,R  lower extremity edema  GI: No abdominal pain, No N/V/D or constipation, No heartburn or reflux  GU: No dysuria, frequency or urgency, or incontinence  MUSCULOSKELETAL: denies pain to leg NEUROLOGIC: No headache, dizziness or focal weakness PSYCHIATRIC: No overt anxiety or sadness. Sleeps well.   Filed Vitals:   05/08/14 1057  BP: 126/70  Pulse: 93  Temp: 97.5 F (36.4 C)  Resp: 18    Physical Exam  GENERAL APPEARANCE: Alert, conversant. Appropriately groomed. No acute distress  SKIN: No diaphoresis  rash HEENT: Unremarkable RESPIRATORY: Breathing is even, unlabored. Lung sounds are clear   CARDIOVASCULAR: Heart RRR no murmurs, rubs or gallops. Mild RLE  peripheral edema; can see imprint of sock in lower leg, caldf is soft and non tender;no swelling to thigh GASTROINTESTINAL: Abdomen is soft, non-tender, not distended w/ normal bowel sounds.  GENITOURINARY: Bladder non tender, not distended  MUSCULOSKELETAL: r great toe dressed. Swelling is decreased and toe non TTP NEUROLOGIC: Cranial nerves 2-12 grossly intact. Moves all extremities no tremor. PSYCHIATRIC: Mood and affect appropriate to situation, no behavioral issues  Patient Active Problem List   Diagnosis Date Noted  . MRSA cellulitis 02/20/2014  . Tardive dyskinesia 02/10/2014  . Infection due to ESBL-producing Escherichia coli 02/02/2014  . Sepsis 01/21/2014  . Acute respiratory failure with hypoxia 01/21/2014  . Fever, unspecified 01/21/2014  . Septic shock 12/25/2013  . UTI 12/25/2013  . Acute encephalopathy 12/25/2013  . Pneumonia, organism unspecified 12/21/2013  . Cough 12/21/2013  . Diarrhea 12/21/2013  . Diaper candidiasis 12/17/2013  . Dysuria 09/17/2013  . Type II or unspecified type diabetes mellitus with neurological manifestations, not stated as uncontrolled(250.60) 08/14/2013  . Pure hyperglyceridemia 06/18/2013  . Type I (juvenile type) diabetes mellitus with neurological manifestations, uncontrolled(250.63) 05/04/2013  . Chronic pain syndrome 05/04/2013  . Unspecified constipation 04/04/2013  . Venous stasis dermatitis 04/03/2012  . COPD exacerbation 02/21/2012  .  Tachycardia 12/27/2011  . Thrush 11/10/2011  . Ankle arthritis 07/28/2011  . Chronic right shoulder pain 06/03/2011  . Leg swelling 03/03/2011  . INSOMNIA, CHRONIC 11/17/2010  . Other chronic pain 11/17/2010  . GREENFIELD FILTER INSERTION, HX OF 11/17/2010  . NEOPLASM UNCERTAIN BEHAVIOR MAJOR SALIV GLANDS 11/10/2009  . PULMONARY EMBOLISM  10/02/2009  . DEEP VENOUS THROMBOPHLEBITIS, LEG, LEFT 10/02/2009  . Pulmonary nodules 09/12/2009  . Wheelchair dependence 04/10/2009  . COR PULMONALE 05/22/2008  . VENOUS INSUFFICIENCY, CHRONIC 03/25/2008  . SYMPTOM, INCONTINENCE, URINARY NOS 07/13/2007  . DIABETES MELLITUS, TYPE II 05/11/2007  . ANEMIA-NOS 05/11/2007  . GERD 05/11/2007  . HYPERLIPIDEMIA 02/16/2007  . OBESITY, NOS 02/16/2007  . BIPOLAR DISORDER 02/16/2007  . ANXIETY 02/16/2007  . HYPERTENSION, BENIGN SYSTEMIC 02/16/2007  . COPD (chronic obstructive pulmonary disease) with emphysema 02/16/2007  . OSTEOPENIA 02/16/2007  . TOBACCO ABUSE, HX OF 02/16/2007    CBC    Component Value Date/Time   WBC 7.1 02/05/2014 0347   RBC 3.29* 02/05/2014 0347   HGB 9.5* 02/05/2014 0347   HCT 30.0* 02/05/2014 0347   PLT 404* 02/05/2014 0347   MCV 91.2 02/05/2014 0347   LYMPHSABS 2.9 02/05/2014 0347   MONOABS 0.6 02/05/2014 0347   EOSABS 0.2 02/05/2014 0347   BASOSABS 0.1 02/05/2014 0347    CMP     Component Value Date/Time   NA 140 02/05/2014 0347   K 3.3* 02/05/2014 0347   CL 100 02/05/2014 0347   CO2 32 02/05/2014 0347   GLUCOSE 215* 02/05/2014 0347   BUN 11 02/05/2014 0347   CREATININE 0.40* 02/05/2014 0347   CREATININE 0.73 03/24/2011 1614   CALCIUM 8.4 02/05/2014 0347   PROT 7.1 01/21/2014 1139   ALBUMIN 3.2* 01/21/2014 1139   AST 30 01/21/2014 1139   ALT 14 01/21/2014 1139   ALKPHOS 57 01/21/2014 1139   BILITOT 0.2* 01/21/2014 1139   GFRNONAA >90 02/05/2014 0347   GFRAA >90 02/05/2014 0347    Assessment and Plan  Leg swelling Pt does not have compression stocking on R leg because of toe infection. Pt is wearing a sock and swelling is better where the sock comes on her leg. Swelling is 2/2 no compression stocking. After 1 week pt will be able to return to compression stockings   CELLULITIS R GREAT TOE -  Improving with doxycycline for a week. Pt still has several days to go with abx treatment.   Hennie Duos, MD

## 2014-05-11 ENCOUNTER — Non-Acute Institutional Stay (SKILLED_NURSING_FACILITY): Payer: Medicare Other | Admitting: Internal Medicine

## 2014-05-11 DIAGNOSIS — J029 Acute pharyngitis, unspecified: Secondary | ICD-10-CM

## 2014-05-11 NOTE — Progress Notes (Signed)
Patient ID: [name], female   DOB: 1941-06-02, 73 y.o.   MRN: [id number] Facility; [facility] Chief complaint; sore throat History; Mrs. Hickam was complaining bitterly today for a sore throat. She stated she had been having this for several days. As usual she is very anxious about this and it is really difficult to get her described what it is she is most upset about. She states it is worse on the left not particularly aggravated by swallowing.  Physical examination O2 sat 92% on 2 L, respirations 22, pulse rate 77 Gen. she is very upset about that her throat HEENT her oropharynx looks completely normal. Lymph there may be a soft fleshy lymph node in the left upper cervical chain. Even before I told her this it seemed that she found this area tender Respiratory; shallow but otherwise clear entry. There is no obvious accessory muscle use. I think her severe COPD is stable at the moment  Pressure/plan #1 complaining of a sore throat. Her oropharynx looks normal however there appears to be a tender lymph node in the upper cervical chain. This is very soft and certainly does not feel ominous. I'm going to go ahead and give her a course of antibiotics most likely augmentin and will try to followup on this area when next in the building.

## 2014-05-17 ENCOUNTER — Telehealth: Payer: Self-pay | Admitting: Pulmonary Disease

## 2014-05-17 NOTE — Telephone Encounter (Signed)
ATC pt's daughter [name] x1 - line busy.  WCB.

## 2014-05-20 NOTE — Telephone Encounter (Signed)
I spoke with the pt daughter and she states the pt was c/o having productive cough with green phlegm, but she states that the doctor at her nursing home saw her and prescribed her an abx. She states that she spoke with her mother over the weekend and she did not c/o the cough any longer. She feels the abx has helped. I advised if anything further is needed to call back. Olar Bing, CMA

## 2014-05-21 ENCOUNTER — Other Ambulatory Visit: Payer: Self-pay | Admitting: *Deleted

## 2014-05-21 MED ORDER — OXYCODONE HCL ER 10 MG PO T12A: EXTENDED_RELEASE_TABLET | ORAL | Status: DC

## 2014-05-21 NOTE — Telephone Encounter (Signed)
[facility] 

## 2014-06-25 ENCOUNTER — Other Ambulatory Visit: Payer: Self-pay | Admitting: *Deleted

## 2014-06-25 MED ORDER — OXYCODONE HCL ER 10 MG PO T12A: EXTENDED_RELEASE_TABLET | ORAL | Status: DC

## 2014-07-05 ENCOUNTER — Non-Acute Institutional Stay (SKILLED_NURSING_FACILITY): Payer: Medicare Other | Admitting: Internal Medicine

## 2014-07-05 DIAGNOSIS — M7989 Other specified soft tissue disorders: Secondary | ICD-10-CM

## 2014-07-09 NOTE — Progress Notes (Addendum)
Patient ID: [name], female   DOB: [birth year], 73 y.o.   MRN: [id number]               PROGRESS NOTE  DATE:  07/05/2014    FACILITY: [facility]    LEVEL OF CARE:   SNF   Acute Visit   CHIEF COMPLAINT:  Increasing edema.    HISTORY OF PRESENT ILLNESS:  [name] stopped me in the hall today, reporting that she has not voided today.   The staff, of course, corrected her that she has voided once.  She then changed the equation to that she was increasingly edematous and needed more Demadex.  The patient is on Demadex 40 mg in the morning.    The patient has a history of severe COPD.  She also has a chronic IVC filter.  Her most problematic issues, however, are COPD on chronic oxygen and marginally controlled bipolar affective disorder.    REVIEW OF SYSTEMS:  The patient is much too agitated to make this meaningful.    PHYSICAL EXAMINATION:   VITAL SIGNS:   O2 SATURATIONS:  95% on 2 L.   CHEST/RESPIRATORY:  Decreased air entry bilaterally.  She is in such a state of anxiety that it is hard to comment on work of breathing.   CARDIOVASCULAR:  CARDIAC:  Heart sounds are distant.  Her JVP is not elevated.   EDEMA/VARICOSITIES:  Extremities:  She has chronic edema; however, not much in the way of anything that looks acute.   She has TED stockings on.    ASSESSMENT/PLAN:  Cor pulmonale.  She very well could have some degree of increasing edema.  Her weight has increased over the last three months from 191 to 196.  I will give her an extra dose of Demadex tonight and tomorrow, then increase her to 60 mg a day.  A basic metabolic panel will need to be ordered.

## 2014-07-10 ENCOUNTER — Other Ambulatory Visit: Payer: Self-pay | Admitting: *Deleted

## 2014-07-10 MED ORDER — AMBULATORY NON FORMULARY MEDICATION: Status: DC

## 2014-07-10 NOTE — Telephone Encounter (Signed)
[facility] 

## 2014-07-19 ENCOUNTER — Other Ambulatory Visit (HOSPITAL_COMMUNITY): Payer: Self-pay | Admitting: Internal Medicine

## 2014-07-19 DIAGNOSIS — R1310 Dysphagia, unspecified: Secondary | ICD-10-CM

## 2014-07-25 ENCOUNTER — Ambulatory Visit (HOSPITAL_COMMUNITY)
Admission: RE | Admit: 2014-07-25 | Discharge: 2014-07-25 | Disposition: A | Discharge status: Home / Self Care | Payer: Medicare Other | Source: Ambulatory Visit | Attending: Internal Medicine | Admitting: Internal Medicine

## 2014-07-25 DIAGNOSIS — J449 Chronic obstructive pulmonary disease, unspecified: Secondary | ICD-10-CM | POA: Diagnosis not present

## 2014-07-25 DIAGNOSIS — D649 Anemia, unspecified: Secondary | ICD-10-CM | POA: Diagnosis not present

## 2014-07-25 DIAGNOSIS — E785 Hyperlipidemia, unspecified: Secondary | ICD-10-CM | POA: Diagnosis not present

## 2014-07-25 DIAGNOSIS — J4489 Other specified chronic obstructive pulmonary disease: Secondary | ICD-10-CM | POA: Insufficient documentation

## 2014-07-25 DIAGNOSIS — R1310 Dysphagia, unspecified: Secondary | ICD-10-CM | POA: Insufficient documentation

## 2014-07-25 DIAGNOSIS — E119 Type 2 diabetes mellitus without complications: Secondary | ICD-10-CM | POA: Diagnosis not present

## 2014-07-25 DIAGNOSIS — Z86718 Personal history of other venous thrombosis and embolism: Secondary | ICD-10-CM | POA: Diagnosis not present

## 2014-07-25 DIAGNOSIS — F411 Generalized anxiety disorder: Secondary | ICD-10-CM | POA: Insufficient documentation

## 2014-07-25 DIAGNOSIS — Z9981 Dependence on supplemental oxygen: Secondary | ICD-10-CM | POA: Diagnosis not present

## 2014-07-25 DIAGNOSIS — Z8541 Personal history of malignant neoplasm of cervix uteri: Secondary | ICD-10-CM | POA: Diagnosis not present

## 2014-07-25 DIAGNOSIS — R1312 Dysphagia, oropharyngeal phase: Secondary | ICD-10-CM | POA: Insufficient documentation

## 2014-07-25 DIAGNOSIS — F3130 Bipolar disorder, current episode depressed, mild or moderate severity, unspecified: Secondary | ICD-10-CM | POA: Insufficient documentation

## 2014-07-25 NOTE — Procedures (Signed)
Objective Swallowing Evaluation: Modified Barium Swallowing Study  Patient Details  Name: [name] MRN: [id number] Date of Birth: May 28, 1941  Today's Date: 07/25/2014 Time: 1115-1207 SLP Time Calculation (min): 52 min  Past Medical History:  Past Medical History  Diagnosis Date  . Anxiety   . Arthritis   . COPD (chronic obstructive pulmonary disease)   . GERD (gastroesophageal reflux disease)   . Hyperlipidemia   . Osteoporosis   . DVT (deep venous thrombosis)   . PE (pulmonary thromboembolism)   . Hypertension      PT DENIES....ON NO  MEDS  . Diabetes mellitus   . Cancer 1969    cervical  . Pneumonia     h/o  . Bronchitis     h/o  . Blood transfusion   . Anemia   . Bipolar affect, depressed    Past Surgical History:  Past Surgical History  Procedure Laterality Date  . Vena cava filter placement    . 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 Eye surgery      CATARACTS  . Achilles tendon lengthening  11/18/11    and repair w/posterior tibial tendon lengthening; right  foot  . Cholecystectomy    . Abdominal hysterectomy  1969    "womb taken out for cervical cancer"  . Cataract extraction, bilateral  ~ 2008  . Tonsillectomy      "as a child"  . Achilles tendon surgery  11/18/2011    Procedure: ACHILLES TENDON REPAIR;  Surgeon: Wylene Simmer, MD;  Location: [facility];  Service: Orthopedics;  Laterality: Right;  Right Posterior Tibial Tendon Lenghtening and Tendon Achilles Lenghtening    HPI:  73 yo female with PMH significant for COPD on supplemental O2, acid reflux, diabetes, and known oropharyngeal dysphagia who reports with c/o regurgitating food during or shortly after meals. Most recent MBS in February 2015 recommends Dys 3 textures and thin liquids with throat clear after each sip due to deep, silent penetration.     Assessment / Plan / Recommendation Clinical Impression  Dysphagia Diagnosis: Mild oral phase dysphagia;Moderate pharyngeal phase  dysphagia;Suspected primary esophageal dysphagia  Clinical Impression: Pt presents with a mild oral and moderate pharyngeal dysphagia, with suspected primary esophageal component. Pt demonstrated mild oral holding and delayed mastication, with premature spillage of liquids to the pyriform sinuses and puree to the valleculae. Premature spillage and delayed swallow initiation led to incomplete airway protection with deep, silent penetration to the vocal folds with thin liquids. Max cues from SLP for a strong, cued throat clear was effective at clearing penetrates, although unfortunately resulted in regurgitation of barium contrast from the esophagus into her pharynx, larynx, and oral cavity with ultimate oral expectoration. Trace, shallow penetration with nectar thick liquids was ultimately cleared spontaneously, and did not require additional compensatory strategies. Recommend Dys 3 textures and nectar thick liquids until further esophageal assessment can be completed. Pt's esophagus appeared to be filled with barium contrast upon completion of MBS, although MD was not present to confirm.   SLP provided thorough education to patient and daughter regarding results, recommendations, and risks of aspiration. Pt was adamant that she would not drink thickened liquids, despite review of the above information. Believe that pt needs continued education and reinforcement to decide what risks she will accept; however, should pt decide that she wishes to remain on thin liquids, recommendations would be to utilize very small cup sips followed by several gentle throat clears per sip.  Treatment Recommendation  Defer treatment plan to SLP at (Comment) (SNF)    Diet Recommendation Dysphagia 3 (Mechanical Soft);Nectar-thick liquid   Liquid Administration via: Cup;No straw Medication Administration: Crushed with puree Supervision: Patient able to self feed;Full supervision/cueing for compensatory  strategies Compensations: Slow rate;Small sips/bites;Follow solids with liquid;Clear throat intermittently Postural Changes and/or Swallow Maneuvers: Seated upright 90 degrees;Upright 30-60 min after meal    Other  Recommendations Recommended Consults: Consider esophageal assessment Oral Care Recommendations: Oral care BID   Follow Up Recommendations  Skilled Nursing facility    Frequency and Duration        Pertinent Vitals/Pain n/a    SLP Swallow Goals     General Date of Onset:  (approximately 2 weeks ago, after hospitalization for sepsis) HPI: 73 yo female with PMH significant for COPD on supplemental O2, acid reflux, diabetes, and known oropharyngeal dysphagia who reports with c/o regurgitating food during or shortly after meals. Most recent MBS in February 2015 recommends Dys 3 textures and thin liquids with throat clear after each sip due to deep, silent penetration. Type of Study: Modified Barium Swallowing Study Reason for Referral: Objectively evaluate swallowing function Previous Swallow Assessment: multiple - refer to HPI Diet Prior to this Study: Dysphagia 3 (soft);Thin liquids Respiratory Status: Nasal cannula (2L) History of Recent Intubation: No Behavior/Cognition: Alert;Cooperative;Pleasant mood;Confused;Requires cueing Oral Cavity - Dentition: Missing dentition Self-Feeding Abilities: Able to feed self Patient Positioning: Upright in chair Baseline Vocal Quality: Wet Volitional Cough: Strong Volitional Swallow: Able to elicit (with delay) Anatomy: Within functional limits Pharyngeal Secretions: Not observed secondary MBS    Reason for Referral Objectively evaluate swallowing function   Oral Phase Oral Preparation/Oral Phase Oral Phase: Impaired Oral - Nectar Oral - Nectar Cup: Holding of bolus;Weak lingual manipulation;Other (Comment) (decreased bolus cohesion) Oral - Thin Oral - Thin Cup: Weak lingual manipulation;Holding of bolus;Other (Comment)  (decreased bolus cohesion) Oral - Solids Oral - Puree: Weak lingual manipulation;Holding of bolus;Other (Comment) (decreased bolus cohesion) Oral - Mechanical Soft: Impaired mastication;Weak lingual manipulation   Pharyngeal Phase Pharyngeal Phase Pharyngeal Phase: Impaired Pharyngeal - Nectar Pharyngeal - Nectar Cup: Delayed swallow initiation;Premature spillage to pyriform sinuses;Penetration/Aspiration during swallow Penetration/Aspiration details (nectar cup): Material enters airway, remains ABOVE vocal cords then ejected out Pharyngeal - Thin Pharyngeal - Thin Cup: Delayed swallow initiation;Premature spillage to pyriform sinuses;Penetration/Aspiration during swallow;Compensatory strategies attempted (Comment) (throat clear resulted in regurgitation of barium material) Penetration/Aspiration details (thin cup): Material enters airway, CONTACTS cords and not ejected out Pharyngeal - Solids Pharyngeal - Puree: Delayed swallow initiation;Premature spillage to valleculae Pharyngeal - Mechanical Soft: Delayed swallow initiation  Cervical Esophageal Phase    GO    Cervical Esophageal Phase Cervical Esophageal Phase: Impaired Cervical Esophageal Phase - Nectar Nectar Cup: Esophageal backflow into the pharynx Cervical Esophageal Phase - Thin Thin Cup: Esophageal backflow into the pharynx;Esophageal backflow into the larynx;Other (Comment) (orally expectorated   ) Cervical Esophageal Phase - Comment Cervical Esophageal Comment: pt's esophagus appeared to be full of barium contrast upon completion of MBS    Functional Assessment Tool Used: skilled clinical judgment Functional Limitations: Swallowing Swallow Current Status (Q7341): At least 40 percent but less than 60 percent impaired, limited or restricted Swallow Goal Status [phone]): At least 40 percent but less than 60 percent impaired, limited or restricted Swallow Discharge Status [phone]): At least 40 percent but less than 60  percent impaired, limited or restricted      Germain Osgood, M.A. CCC-SLP [phone]  Germain Osgood 07/25/2014, 1:10 PM

## 2014-07-27 ENCOUNTER — Non-Acute Institutional Stay (SKILLED_NURSING_FACILITY): Payer: Medicare Other | Admitting: Internal Medicine

## 2014-07-27 ENCOUNTER — Emergency Department (HOSPITAL_COMMUNITY)
Admission: EM | Admit: 2014-07-27 | Discharge: 2014-07-27 | Disposition: A | Discharge status: Home / Self Care | Payer: Medicare Other | Attending: Emergency Medicine | Admitting: Emergency Medicine

## 2014-07-27 ENCOUNTER — Emergency Department (HOSPITAL_COMMUNITY): Payer: Medicare Other

## 2014-07-27 ENCOUNTER — Encounter (HOSPITAL_COMMUNITY): Payer: Self-pay | Admitting: Emergency Medicine

## 2014-07-27 DIAGNOSIS — Z86711 Personal history of pulmonary embolism: Secondary | ICD-10-CM | POA: Diagnosis not present

## 2014-07-27 DIAGNOSIS — K219 Gastro-esophageal reflux disease without esophagitis: Secondary | ICD-10-CM | POA: Diagnosis not present

## 2014-07-27 DIAGNOSIS — Z794 Long term (current) use of insulin: Secondary | ICD-10-CM | POA: Diagnosis not present

## 2014-07-27 DIAGNOSIS — Z8701 Personal history of pneumonia (recurrent): Secondary | ICD-10-CM | POA: Diagnosis not present

## 2014-07-27 DIAGNOSIS — Z87891 Personal history of nicotine dependence: Secondary | ICD-10-CM | POA: Insufficient documentation

## 2014-07-27 DIAGNOSIS — Z8541 Personal history of malignant neoplasm of cervix uteri: Secondary | ICD-10-CM | POA: Diagnosis not present

## 2014-07-27 DIAGNOSIS — R1310 Dysphagia, unspecified: Secondary | ICD-10-CM

## 2014-07-27 DIAGNOSIS — F411 Generalized anxiety disorder: Secondary | ICD-10-CM | POA: Diagnosis not present

## 2014-07-27 DIAGNOSIS — Z79899 Other long term (current) drug therapy: Secondary | ICD-10-CM | POA: Diagnosis not present

## 2014-07-27 DIAGNOSIS — R0602 Shortness of breath: Secondary | ICD-10-CM | POA: Insufficient documentation

## 2014-07-27 DIAGNOSIS — IMO0002 Reserved for concepts with insufficient information to code with codable children: Secondary | ICD-10-CM | POA: Insufficient documentation

## 2014-07-27 DIAGNOSIS — F319 Bipolar disorder, unspecified: Secondary | ICD-10-CM

## 2014-07-27 DIAGNOSIS — Z7982 Long term (current) use of aspirin: Secondary | ICD-10-CM | POA: Diagnosis not present

## 2014-07-27 DIAGNOSIS — I10 Essential (primary) hypertension: Secondary | ICD-10-CM | POA: Insufficient documentation

## 2014-07-27 DIAGNOSIS — Z86718 Personal history of other venous thrombosis and embolism: Secondary | ICD-10-CM | POA: Insufficient documentation

## 2014-07-27 DIAGNOSIS — M129 Arthropathy, unspecified: Secondary | ICD-10-CM | POA: Insufficient documentation

## 2014-07-27 DIAGNOSIS — E785 Hyperlipidemia, unspecified: Secondary | ICD-10-CM | POA: Diagnosis not present

## 2014-07-27 DIAGNOSIS — J441 Chronic obstructive pulmonary disease with (acute) exacerbation: Secondary | ICD-10-CM | POA: Diagnosis not present

## 2014-07-27 DIAGNOSIS — Z9119 Patient's noncompliance with other medical treatment and regimen: Secondary | ICD-10-CM

## 2014-07-27 DIAGNOSIS — Z862 Personal history of diseases of the blood and blood-forming organs and certain disorders involving the immune mechanism: Secondary | ICD-10-CM | POA: Insufficient documentation

## 2014-07-27 DIAGNOSIS — F3130 Bipolar disorder, current episode depressed, mild or moderate severity, unspecified: Secondary | ICD-10-CM | POA: Diagnosis not present

## 2014-07-27 DIAGNOSIS — Z91199 Patient's noncompliance with other medical treatment and regimen due to unspecified reason: Secondary | ICD-10-CM

## 2014-07-27 DIAGNOSIS — R0981 Nasal congestion: Secondary | ICD-10-CM

## 2014-07-27 MED ORDER — OXYCODONE-ACETAMINOPHEN 5-325 MG PO TABS
1.0000 | ORAL_TABLET | Freq: Once | ORAL | Status: AC
  Administered 2014-07-27: 1 via ORAL
  Filled 2014-07-27: qty 1

## 2014-07-27 MED ORDER — LORAZEPAM 1 MG PO TABS
1.0000 mg | ORAL_TABLET | Freq: Once | ORAL | Status: AC
  Administered 2014-07-27: 1 mg via ORAL
  Filled 2014-07-27: qty 1

## 2014-07-27 MED ORDER — LORATADINE 10 MG PO TABS
10.0000 mg | ORAL_TABLET | Freq: Once | ORAL | Status: AC
  Administered 2014-07-27: 10 mg via ORAL
  Filled 2014-07-27: qty 1

## 2014-07-27 MED ORDER — OXYMETAZOLINE HCL 0.05 % NA SOLN
1.0000 | Freq: Once | NASAL | Status: AC
  Administered 2014-07-27: 1 via NASAL
  Filled 2014-07-27: qty 15

## 2014-07-27 NOTE — ED Provider Notes (Signed)
CSN: [id number]     Arrival date & time 07/27/14  0751 History   First MD Initiated Contact with Patient 07/27/14 0800     Chief Complaint  Patient presents with  . Shortness of Breath     (Consider location/radiation/quality/duration/timing/severity/associated sxs/prior Treatment) HPI  73yF with congestion. Reported as SOB. Pt telling me though that when she woke up this morning she felt like her "nose (was) plugged up" and that it didn't feel like she was getting oxygen through nasal cannula because of this. She has a hx of COPD chronically on 2L. Only other acute complaint is back pain which is chronic. Requesting pain medication. "They usually give me dilaudid." Denies CP. No new cough. No fever or chills. No n/v.   Past Medical History  Diagnosis Date  . Anxiety   . Arthritis   . COPD (chronic obstructive pulmonary disease)   . GERD (gastroesophageal reflux disease)   . Hyperlipidemia   . Osteoporosis   . DVT (deep venous thrombosis)   . PE (pulmonary thromboembolism)   . Hypertension      PT DENIES....ON NO  MEDS  . Diabetes mellitus   . Cancer 1969    cervical  . Pneumonia     h/o  . Bronchitis     h/o  . Blood transfusion   . Anemia   . Bipolar affect, depressed    Past Surgical History  Procedure Laterality Date  . Vena cava filter placement    . 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 Eye surgery      CATARACTS  . Achilles tendon lengthening  11/18/11    and repair w/posterior tibial tendon lengthening; right  foot  . Cholecystectomy    . Abdominal hysterectomy  1969    "womb taken out for cervical cancer"  . Cataract extraction, bilateral  ~ 2008  . Tonsillectomy      "as a child"  . Achilles tendon surgery  11/18/2011    Procedure: ACHILLES TENDON REPAIR;  Surgeon: Wylene Simmer, MD;  Location: [facility];  Service: Orthopedics;  Laterality: Right;  Right Posterior Tibial Tendon Lenghtening and Tendon Achilles Lenghtening    Family History  Problem  Relation Age of Onset  . Heart disease Father   . Heart attack Father   . Clotting disorder Father   . Stroke Mother    History  Substance Use Topics  . Smoking status: Former Smoker -- 1.00 packs/day    Types: Cigarettes    Quit date: 11/14/1986  . Smokeless tobacco: Never Used     Comment: pt cannot recall the total amt of time she smoked.  . Alcohol Use: No   OB History   Grav Para Term Preterm Abortions TAB SAB Ect Mult Living                 Review of Systems  All systems reviewed and negative, other than as noted in HPI.   Allergies  Morphine and related; Olanzapine; and Sulfamethoxazole-trimethoprim  Home Medications   Prior to Admission medications   Medication Sig Start Date End Date Taking? Authorizing Provider  acetaminophen (TYLENOL) 325 MG tablet Take 650 mg by mouth every 4 (four) hours as needed for mild pain.    Historical Provider, MD  ALPRAZolam Duanne Moron) 1 MG tablet Take one tablet by mouth three times daily 02/06/14   Estill Dooms, MD  AMBULATORY NON FORMULARY MEDICATION Lorazepam Inj 2mg /ml Sig: Inject 24ml intramuscular every  12 hours for severe anxiety/agitation 07/10/14   Blanchie Serve, MD  antiseptic oral rinse (BIOTENE) LIQD 15 mLs by Mouth Rinse route every 4 (four) hours as needed for dry mouth.    Historical Provider, MD  Artificial Tear Ointment (REFRESH LACRI-LUBE) OINT Place 1 application into both eyes at bedtime.    Historical Provider, MD  aspirin 81 MG chewable tablet Chew 81 mg by mouth daily.    Historical Provider, MD  bisacodyl (DULCOLAX) 10 MG suppository Place 10 mg rectally 3 (three) times a week. On Monday, Wednesday, and Friday    Historical Provider, MD  Calcium & Magnesium Carbonates (MYLANTA PO) Take 30 mLs by mouth every 4 (four) hours as needed (indigestion).    Historical Provider, MD  calcium & magnesium carbonates (MYLANTA) 311-232 MG per tablet Take 1 tablet by mouth as needed for heartburn.    Historical Provider, MD   Cranberry-Vitamin C-Inulin (UTI-STAT) LIQD Take by mouth as needed.    Historical Provider, MD  feeding supplement, GLUCERNA SHAKE, (GLUCERNA SHAKE) LIQD Take 237 mLs by mouth 3 (three) times daily between meals. 01/01/14   Geradine Girt, DO  fish oil-omega-3 fatty acids 1000 MG capsule Take 1 g by mouth 2 (two) times daily.     Historical Provider, MD  Fluticasone-Salmeterol (ADVAIR DISKUS) 250-50 MCG/DOSE AEPB Inhale 1 puff into the lungs 2 (two) times daily. 03/11/14   Kathee Delton, MD  gabapentin (NEURONTIN) 300 MG capsule Take 3 capsules (900 mg total) by mouth 3 (three) times daily. 01/01/14   Geradine Girt, DO  HYDROcodone-acetaminophen (NORCO/VICODIN) 5-325 MG per tablet Take 1 tablet by mouth every 6 (six) hours as needed for moderate pain. 05/01/14   Blanchie Serve, MD  insulin aspart (NOVOLOG) 100 UNIT/ML FlexPen Inject 3-5 Units into the skin 3 (three) times daily before meals. Give 3 units before meals with an additional 5 units for CBG> or < 150    Historical Provider, MD  insulin glargine (LANTUS) 100 UNIT/ML injection Inject 0.22 mLs (22 Units total) into the skin daily. 01/01/14   Geradine Girt, DO  levalbuterol (XOPENEX HFA) 45 MCG/ACT inhaler Inhale 1-2 puffs into the lungs every 4 (four) hours as needed for wheezing.    Historical Provider, MD  levalbuterol (XOPENEX) 1.25 MG/0.5ML nebulizer solution Take 1.25 mg by nebulization 4 (four) times daily. 01/01/14   Geradine Girt, DO  Magnesium Hydroxide (MILK OF MAGNESIA PO) Take 30 mLs by mouth See admin instructions. If no BM in 3 days, give 29ml in a 24 hours PRN.    Historical Provider, MD  metFORMIN (GLUCOPHAGE) 1000 MG tablet Take 1,000 mg by mouth daily before supper.    Historical Provider, MD  metoprolol tartrate (LOPRESSOR) 25 MG tablet Take 1 tablet (25 mg total) by mouth 2 (two) times daily. 01/01/14   Geradine Girt, DO  nystatin (MYCOSTATIN) 100000 UNIT/ML suspension Take 5 mLs (500,000 Units total) by mouth 4 (four) times  daily. 01/01/14   Geradine Girt, DO  omeprazole (PRILOSEC OTC) 20 MG tablet Take 20 mg by mouth 2 (two) times daily.    Historical Provider, MD  oxybutynin (DITROPAN XL) 10 MG 24 hr tablet Take 10 mg by mouth at bedtime.    Historical Provider, MD  OxyCODONE (OXYCONTIN) 10 mg T12A 12 hr tablet Take one tablet by mouth every 12 hours. Do not crush 06/25/14   Blanchie Serve, MD  polyethylene glycol (MIRALAX / GLYCOLAX) packet Take 17 g by mouth daily.  Historical Provider, MD  potassium chloride (K-DUR,KLOR-CON) 10 MEQ tablet Take 5 mEq by mouth every evening.  08/25/11   Judithann Sheen, MD  predniSONE (DELTASONE) 10 MG tablet Take 1 tablet (10 mg total) by mouth daily with breakfast. 2 additional days 02/05/14   Caren Griffins, MD  promethazine (PHENERGAN) 25 MG tablet Take 25 mg by mouth every 6 (six) hours as needed. nausea    Historical Provider, MD  QUEtiapine (SEROQUEL) 200 MG tablet Take 200 mg by mouth at bedtime.    Historical Provider, MD  simvastatin (ZOCOR) 10 MG tablet Take 10 mg by mouth at bedtime.    Historical Provider, MD  sodium phosphate (FLEET) enema Place 1 enema rectally daily as needed (constipation). follow package directions    Historical Provider, MD  tiotropium (SPIRIVA) 18 MCG inhalation capsule Place 1 capsule (18 mcg total) into inhaler and inhale daily. 08/25/11   Judithann Sheen, MD  torsemide (DEMADEX) 20 MG tablet Take 40 mg by mouth every morning.    Historical Provider, MD  traZODone (DESYREL) 50 MG tablet Take 25 mg by mouth daily as needed (anxiety).  01/31/12   Waldemar Dickens, MD  TRIMETHOPRIM HCL PO Take 100 mg by mouth daily.    Historical Provider, MD  venlafaxine XR (EFFEXOR-XR) 150 MG 24 hr capsule Take 150 mg by mouth every morning.  01/31/12   Waldemar Dickens, MD   BP 128/67  Pulse 105  Temp(Src) 97.6 F (36.4 C) (Oral)  Resp 18  SpO2 100% Physical Exam  Nursing note and vitals reviewed. Constitutional: She appears well-developed and  well-nourished. No distress.  HENT:  Head: Normocephalic and atraumatic.  Eyes: Conjunctivae are normal. Right eye exhibits no discharge. Left eye exhibits no discharge.  Neck: Neck supple.  Cardiovascular: Normal rate, regular rhythm and normal heart sounds.  Exam reveals no gallop and no friction rub.   No murmur heard. Pulmonary/Chest: Effort normal. No respiratory distress. She has wheezes.  Abdominal: Soft. She exhibits no distension. There is no tenderness.  Musculoskeletal: She exhibits no edema and no tenderness.  Neurological: She is alert.  Skin: Skin is warm and dry.  Psychiatric: She has a normal mood and affect. Her behavior is normal. Thought content normal.    ED Course  Procedures (including critical care time) Labs Review Labs Reviewed - No data to display  Imaging Review Dg Swallowing Func-speech Pathology  07/25/2014   Loletha Grayer, CCC-SLP     07/25/2014  1:16 PM Objective Swallowing Evaluation: Modified Barium Swallowing Study   Patient Details  Name: GRICELDA FOLAND MRN: [id number] Date of Birth: 1941/08/24  Today's Date: 07/25/2014 Time: 1115-1207 SLP Time Calculation (min): 52 min  Past Medical History:  Past Medical History  Diagnosis Date  . Anxiety   . Arthritis   . COPD (chronic obstructive pulmonary disease)   . GERD (gastroesophageal reflux disease)   . Hyperlipidemia   . Osteoporosis   . DVT (deep venous thrombosis)   . PE (pulmonary thromboembolism)   . Hypertension      PT DENIES....ON NO  MEDS  . Diabetes mellitus   . Cancer 1969    cervical  . Pneumonia     h/o  . Bronchitis     h/o  . Blood transfusion   . Anemia   . Bipolar affect, depressed    Past Surgical History:  Past Surgical History  Procedure Laterality Date  . Vena cava filter placement    . Cancer  of womb      REMOVED PART OF WOMB  . Eye surgery      CATARACTS  . Achilles tendon lengthening  11/18/11    and repair w/posterior tibial tendon lengthening; right  foot  . Cholecystectomy    . Abdominal  hysterectomy  1969    "womb taken out for cervical cancer"  . Cataract extraction, bilateral  ~ 2008  . Tonsillectomy      "as a child"  . Achilles tendon surgery  11/18/2011    Procedure: ACHILLES TENDON REPAIR;  Surgeon: Wylene Simmer, MD;   Location: [facility];  Service: Orthopedics;  Laterality: Right;   Right Posterior Tibial Tendon Lenghtening and Tendon Achilles  Lenghtening    HPI:  73 yo female with PMH significant for COPD on supplemental O2,  acid reflux, diabetes, and known oropharyngeal dysphagia who  reports with c/o regurgitating food during or shortly after  meals. Most recent MBS in February 2015 recommends Dys 3 textures  and thin liquids with throat clear after each sip due to deep,  silent penetration.     Assessment / Plan / Recommendation Clinical Impression  Dysphagia Diagnosis: Mild oral phase dysphagia;Moderate  pharyngeal phase dysphagia;Suspected primary esophageal dysphagia  Clinical Impression: Pt presents with a mild oral and moderate  pharyngeal dysphagia, with suspected primary esophageal  component. Pt demonstrated mild oral holding and delayed  mastication, with premature spillage of liquids to the pyriform  sinuses and puree to the valleculae. Premature spillage and  delayed swallow initiation led to incomplete airway protection  with deep, silent penetration to the vocal folds with thin  liquids. Max cues from SLP for a strong, cued throat clear was  effective at clearing penetrates, although unfortunately resulted  in regurgitation of barium contrast from the esophagus into her  pharynx, larynx, and oral cavity with ultimate oral  expectoration. Trace, shallow penetration with nectar thick  liquids was ultimately cleared spontaneously, and did not require  additional compensatory strategies. Recommend Dys 3 textures and  nectar thick liquids until further esophageal assessment can be  completed. Pt's esophagus appeared to be filled with barium  contrast upon completion of MBS, although  MD was not present to  confirm.   SLP provided thorough education to patient and daughter regarding  results, recommendations, and risks of aspiration. Pt was adamant  that she would not drink thickened liquids, despite review of the  above information. Believe that pt needs continued education and  reinforcement to decide what risks she will accept; however,  should pt decide that she wishes to remain on thin liquids,  recommendations would be to utilize very small cup sips followed  by several gentle throat clears per sip.     Treatment Recommendation  Defer treatment plan to SLP at (Comment) (SNF)    Diet Recommendation Dysphagia 3 (Mechanical Soft);Nectar-thick  liquid   Liquid Administration via: Cup;No straw Medication Administration: Crushed with puree Supervision: Patient able to self feed;Full supervision/cueing  for compensatory strategies Compensations: Slow rate;Small sips/bites;Follow solids with  liquid;Clear throat intermittently Postural Changes and/or Swallow Maneuvers: Seated upright 90  degrees;Upright 30-60 min after meal    Other  Recommendations Recommended Consults: Consider esophageal  assessment Oral Care Recommendations: Oral care BID   Follow Up Recommendations  Skilled Nursing facility    Frequency and Duration        Pertinent Vitals/Pain n/a    SLP Swallow Goals     General Date of Onset:  (approximately 2 weeks ago, after  hospitalization for sepsis) HPI: 73 yo female with PMH significant for COPD on supplemental  O2, acid reflux, diabetes, and known oropharyngeal dysphagia who  reports with c/o regurgitating food during or shortly after  meals. Most recent MBS in February 2015 recommends Dys 3 textures  and thin liquids with throat clear after each sip due to deep,  silent penetration. Type of Study: Modified Barium Swallowing Study Reason for Referral: Objectively evaluate swallowing function Previous Swallow Assessment: multiple - refer to HPI Diet Prior to this Study: Dysphagia 3  (soft);Thin liquids Respiratory Status: Nasal cannula (2L) History of Recent Intubation: No Behavior/Cognition: Alert;Cooperative;Pleasant  mood;Confused;Requires cueing Oral Cavity - Dentition: Missing dentition Self-Feeding Abilities: Able to feed self Patient Positioning: Upright in chair Baseline Vocal Quality: Wet Volitional Cough: Strong Volitional Swallow: Able to elicit (with delay) Anatomy: Within functional limits Pharyngeal Secretions: Not observed secondary MBS    Reason for Referral Objectively evaluate swallowing function   Oral Phase Oral Preparation/Oral Phase Oral Phase: Impaired Oral - Nectar Oral - Nectar Cup: Holding of bolus;Weak lingual  manipulation;Other (Comment) (decreased bolus cohesion) Oral - Thin Oral - Thin Cup: Weak lingual manipulation;Holding of bolus;Other  (Comment) (decreased bolus cohesion) Oral - Solids Oral - Puree: Weak lingual manipulation;Holding of bolus;Other  (Comment) (decreased bolus cohesion) Oral - Mechanical Soft: Impaired mastication;Weak lingual  manipulation   Pharyngeal Phase Pharyngeal Phase Pharyngeal Phase: Impaired Pharyngeal - Nectar Pharyngeal - Nectar Cup: Delayed swallow initiation;Premature  spillage to pyriform sinuses;Penetration/Aspiration during  swallow Penetration/Aspiration details (nectar cup): Material enters  airway, remains ABOVE vocal cords then ejected out Pharyngeal - Thin Pharyngeal - Thin Cup: Delayed swallow initiation;Premature  spillage to pyriform sinuses;Penetration/Aspiration during  swallow;Compensatory strategies attempted (Comment) (throat clear  resulted in regurgitation of barium material) Penetration/Aspiration details (thin cup): Material enters  airway, CONTACTS cords and not ejected out Pharyngeal - Solids Pharyngeal - Puree: Delayed swallow initiation;Premature spillage  to valleculae Pharyngeal - Mechanical Soft: Delayed swallow initiation  Cervical Esophageal Phase    GO    Cervical Esophageal Phase Cervical Esophageal  Phase: Impaired Cervical Esophageal Phase - Nectar Nectar Cup: Esophageal backflow into the pharynx Cervical Esophageal Phase - Thin Thin Cup: Esophageal backflow into the pharynx;Esophageal  backflow into the larynx;Other (Comment) (orally expectorated   ) Cervical Esophageal Phase - Comment Cervical Esophageal Comment: pt's esophagus appeared to be full  of barium contrast upon completion of MBS    Functional Assessment Tool Used: skilled clinical judgment Functional Limitations: Swallowing Swallow Current Status ([id number]): At least 40 percent but less than  60 percent impaired, limited or restricted Swallow Goal Status [phone]): At least 40 percent but less than 60  percent impaired, limited or restricted Swallow Discharge Status [phone]): At least 40 percent but less  than 60 percent impaired, limited or restricted      Germain Osgood, M.A. CCC-SLP [phone]  Germain Osgood 07/25/2014, 1:10 PM      EKG Interpretation None      MDM   Final diagnoses:  Nasal congestion    73yF with nasal congestion. She's not actually acutely short of breath, but rather cannot feel oxygen per nasal cannula like she normally does. She is in no acute distress appears to be at her baseline per report. Decongestants. Outpatient followup as needed otherwise.    Virgel Manifold, MD 08/01/14 [phone]

## 2014-07-27 NOTE — Progress Notes (Signed)
MRN: [id number] Name: [name]  Sex: female Age: 73 y.o. DOB: April 01, 1941  [facility] #: [facility] Facility/Room: 217W Level Of Care: SNF Provider: Inocencio Homes D Emergency Contacts: Extended Emergency Contact Information Primary Emergency Contact: Wright,[name] Address: [place], [place] [address] [place] Work Phone: [phone] Mobile Phone: [phone] Relation: Daughter Secondary Emergency Contact: Rachelle Hora [place] Phone: [phone] Relation: Sister  Code Status: FULL  Allergies: Benzodiazepines; Morphine and related; Olanzapine; and Sulfamethoxazole-trimethoprim  Chief Complaint  Patient presents with  . Medical Management of Chronic Issues    HPI: Patient is 73 y.o. female who is being seen for routine issues, in particular dysphagia.  Past Medical History  Diagnosis Date  . Anxiety   . Arthritis   . COPD (chronic obstructive pulmonary disease)   . GERD (gastroesophageal reflux disease)   . Hyperlipidemia   . Osteoporosis   . DVT (deep venous thrombosis)   . PE (pulmonary thromboembolism)   . Hypertension      PT DENIES....ON NO  MEDS  . Diabetes mellitus   . Cancer 1969    cervical  . Pneumonia     h/o  . Bronchitis     h/o  . Blood transfusion   . Anemia   . Bipolar affect, depressed     Past Surgical History  Procedure Laterality Date  . Vena cava filter placement    . 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 Eye surgery      CATARACTS  . Achilles tendon lengthening  11/18/11    and repair w/posterior tibial tendon lengthening; right  foot  . Cholecystectomy    . Abdominal hysterectomy  1969    "womb taken out for cervical cancer"  . Cataract extraction, bilateral  ~ 2008  . Tonsillectomy      "as a child"  . Achilles tendon surgery  11/18/2011    Procedure: ACHILLES TENDON REPAIR;  Surgeon: Wylene Simmer, MD;  Location: [place];  Service: Orthopedics;  Laterality:  Right;  Right Posterior Tibial Tendon Lenghtening and Tendon Achilles Lenghtening       Medication List       This list is accurate as of: 07/27/14 11:59 PM.  Always use your most recent med list.               acetaminophen 325 MG tablet  Commonly known as:  TYLENOL  Take 650 mg by mouth every 4 (four) hours as needed for mild pain.     ALPRAZolam 1 MG tablet  Commonly known as:  XANAX  Take one tablet by mouth three times daily     alum & mag hydroxide-simeth 200-200-20 MG/5ML suspension  Commonly known as:  MAALOX/MYLANTA  Take 30 mLs by mouth every 6 (six) hours as needed for indigestion or heartburn.     AMBULATORY NON FORMULARY MEDICATION  - Lorazepam Inj 2mg /ml  - Sig: Inject 24ml intramuscular every 12 hours for severe anxiety/agitation     antiseptic oral rinse Liqd  15 mLs by Mouth Rinse route every 4 (four) hours as needed for dry mouth.     aspirin 81 MG chewable tablet  Chew 81 mg by mouth daily.     bisacodyl 10 MG suppository  Commonly known as:  DULCOLAX  Place 10 mg rectally 3 (three) times a week. On Monday, Wednesday, and Friday  esomeprazole 40 MG capsule  Commonly known as:  NEXIUM  Take 40 mg by mouth daily at 12 noon.     fish oil-omega-3 fatty acids 1000 MG capsule  Take 1 g by mouth 2 (two) times daily.     Fluticasone-Salmeterol 250-50 MCG/DOSE Aepb  Commonly known as:  ADVAIR DISKUS  Inhale 1 puff into the lungs 2 (two) times daily.     gabapentin 300 MG capsule  Commonly known as:  NEURONTIN  Take 3 capsules (900 mg total) by mouth 3 (three) times daily.     HYDROcodone-acetaminophen 5-325 MG per tablet  Commonly known as:  NORCO/VICODIN  Take 1 tablet by mouth every 6 (six) hours as needed for moderate pain.     hydroxypropyl methylcellulose 2.5 % ophthalmic solution  Commonly known as:  ISOPTO TEARS  Place 1 drop into both eyes 4 (four) times daily as needed for dry eyes.     insulin aspart 100 UNIT/ML FlexPen  Commonly  known as:  NOVOLOG  Inject 3-5 Units into the skin 3 (three) times daily before meals. Give 3 units before meals with an additional 5 units for CBG> or < 150     insulin glargine 100 UNIT/ML injection  Commonly known as:  LANTUS  Inject 0.22 mLs (22 Units total) into the skin daily.     levalbuterol 1.25 MG/0.5ML nebulizer solution  Commonly known as:  XOPENEX  Take 1.25 mg by nebulization 4 (four) times daily.     levalbuterol 45 MCG/ACT inhaler  Commonly known as:  XOPENEX HFA  Inhale 1-2 puffs into the lungs every 4 (four) hours as needed for wheezing.     loperamide 2 MG capsule  Commonly known as:  IMODIUM  Take 2 mg by mouth as needed for diarrhea or loose stools.     loratadine 10 MG tablet  Commonly known as:  CLARITIN  Take 10 mg by mouth daily.     metFORMIN 1000 MG tablet  Commonly known as:  GLUCOPHAGE  Take 1,000 mg by mouth 2 (two) times daily with a meal.     metoprolol tartrate 25 MG tablet  Commonly known as:  LOPRESSOR  Take 1 tablet (25 mg total) by mouth 2 (two) times daily.     naproxen sodium 220 MG tablet  Commonly known as:  ANAPROX  Take 220 mg by mouth 2 (two) times daily with a meal.     OxyCODONE 10 mg T12a 12 hr tablet  Commonly known as:  OXYCONTIN  Take one tablet by mouth every 12 hours. Do not crush     polyethylene glycol packet  Commonly known as:  MIRALAX / GLYCOLAX  Take 17 g by mouth daily.     potassium chloride 10 MEQ tablet  Commonly known as:  K-DUR,KLOR-CON  Take 5 mEq by mouth every evening.     promethazine 25 MG tablet  Commonly known as:  PHENERGAN  Take 25 mg by mouth every 6 (six) hours as needed. nausea     QUEtiapine 50 MG tablet  Commonly known as:  SEROQUEL  Take 50 mg by mouth daily.     QUEtiapine 200 MG tablet  Commonly known as:  SEROQUEL  Take 200 mg by mouth at bedtime.     REFRESH LACRI-LUBE Oint  Place 1 application into both eyes at bedtime.     risperiDONE 1 MG tablet  Commonly known as:   RISPERDAL  Take 1-2 mg by mouth 2 (two) times daily.  simvastatin 10 MG tablet  Commonly known as:  ZOCOR  Take 10 mg by mouth at bedtime.     sodium phosphate enema  Commonly known as:  FLEET  Place 1 enema rectally daily as needed (constipation). follow package directions     tiotropium 18 MCG inhalation capsule  Commonly known as:  SPIRIVA  Place 1 capsule (18 mcg total) into inhaler and inhale daily.     torsemide 20 MG tablet  Commonly known as:  DEMADEX  Take 40 mg by mouth every morning.     traZODone 150 MG tablet  Commonly known as:  DESYREL  Take 150 mg by mouth at bedtime.     UTI-STAT Liqd  Take 30 mLs by mouth daily.     venlafaxine XR 150 MG 24 hr capsule  Commonly known as:  EFFEXOR-XR  Take 150 mg by mouth every morning.        No orders of the defined types were placed in this encounter.    Immunization History  Administered Date(s) Administered  . Influenza Split 10/20/2011  . Influenza Whole 10/05/2007, 09/16/2008, 11/17/2010, 10/01/2013  . Pneumococcal Polysaccharide-23 10/21/2003, 11/17/2010, 01/27/2012  . Td 09/20/2003    History  Substance Use Topics  . Smoking status: Former Smoker -- 1.00 packs/day    Types: Cigarettes    Quit date: 11/14/1986  . Smokeless tobacco: Never Used     Comment: pt cannot recall the total amt of time she smoked.  . Alcohol Use: No    Review of Systems  DATA OBTAINED: from patient GENERAL:  no fevers, f SKIN: No itching, rash HEENT: No complaint RESPIRATORY: No cough, wheezing, SOB CARDIAC: No chest pain, palpitations, lower extremity edema  GI: No abdominal pain, No N/V/D or constipation, No heartburn or reflux  GU: No dysuria, frequency or urgency, or incontinence  MUSCULOSKELETAL: No unrelieved bone/joint pain NEUROLOGIC: No headache, dizziness or focal weakness PSYCHIATRIC: No c/o  Filed Vitals:   07/27/14 1345  BP: 142/72  Pulse: 119  Temp: 97.2 F (36.2 C)  Resp: 22    Physical  Exam  GENERAL APPEARANCE: Alert, conversant. Appropriately groomed. No acute distress  SKIN: No diaphoresis rash, or wounds HEENT: Unremarkable RESPIRATORY: Breathing is even, unlabored. Lung sounds are diffusely  Decreased, no wheezing CARDIOVASCULAR: Heart RRR no murmurs, rubs or gallops. No peripheral edema  GASTROINTESTINAL: Abdomen is soft, non-tender, not distended w/ normal bowel sounds.  GENITOURINARY: Bladder non tender, not distended  MUSCULOSKELETAL: No abnormal joints or musculature NEUROLOGIC: Cranial nerves 2-12 grossly intact. Moves all extremities no tremor. PSYCHIATRIC: inappropriate > 50 % of the time, + behavioral issues  Patient Active Problem List   Diagnosis Date Noted  . Dysphagia, unspecified(787.20) 08/09/2014  . Non compliance with medical treatment 08/09/2014  . MRSA cellulitis 02/20/2014  . Tardive dyskinesia 02/10/2014  . Infection due to ESBL-producing Escherichia coli 02/02/2014  . Sepsis 01/21/2014  . Acute respiratory failure with hypoxia 01/21/2014  . Fever, unspecified 01/21/2014  . Septic shock 12/25/2013  . UTI 12/25/2013  . Acute encephalopathy 12/25/2013  . Pneumonia, organism unspecified 12/21/2013  . Cough 12/21/2013  . Diarrhea 12/21/2013  . Diaper candidiasis 12/17/2013  . Dysuria 09/17/2013  . Type II or unspecified type diabetes mellitus with neurological manifestations, not stated as uncontrolled(250.60) 08/14/2013  . Pure hyperglyceridemia 06/18/2013  . Type I (juvenile type) diabetes mellitus with neurological manifestations, uncontrolled(250.63) 05/04/2013  . Chronic pain syndrome 05/04/2013  . Unspecified constipation 04/04/2013  . Venous stasis dermatitis 04/03/2012  .  COPD exacerbation 02/21/2012  . Tachycardia 12/27/2011  . Thrush 11/10/2011  . Ankle arthritis 07/28/2011  . Chronic right shoulder pain 06/03/2011  . Leg swelling 03/03/2011  . INSOMNIA, CHRONIC 11/17/2010  . Other chronic pain 11/17/2010  . GREENFIELD  FILTER INSERTION, HX OF 11/17/2010  . NEOPLASM UNCERTAIN BEHAVIOR MAJOR SALIV GLANDS 11/10/2009  . PULMONARY EMBOLISM 10/02/2009  . DEEP VENOUS THROMBOPHLEBITIS, LEG, LEFT 10/02/2009  . Pulmonary nodules 09/12/2009  . Wheelchair dependence 04/10/2009  . COR PULMONALE 05/22/2008  . VENOUS INSUFFICIENCY, CHRONIC 03/25/2008  . SYMPTOM, INCONTINENCE, URINARY NOS 07/13/2007  . DIABETES MELLITUS, TYPE II 05/11/2007  . ANEMIA-NOS 05/11/2007  . GERD 05/11/2007  . HYPERLIPIDEMIA 02/16/2007  . OBESITY, NOS 02/16/2007  . BIPOLAR DISORDER 02/16/2007  . ANXIETY 02/16/2007  . HYPERTENSION, BENIGN SYSTEMIC 02/16/2007  . COPD (chronic obstructive pulmonary disease) with emphysema 02/16/2007  . OSTEOPENIA 02/16/2007  . TOBACCO ABUSE, HX OF 02/16/2007    CBC    Component Value Date/Time   WBC 7.1 02/05/2014 0347   RBC 3.29* 02/05/2014 0347   HGB 9.5* 02/05/2014 0347   HCT 30.0* 02/05/2014 0347   PLT 404* 02/05/2014 0347   MCV 91.2 02/05/2014 0347   LYMPHSABS 2.9 02/05/2014 0347   MONOABS 0.6 02/05/2014 0347   EOSABS 0.2 02/05/2014 0347   BASOSABS 0.1 02/05/2014 0347    CMP     Component Value Date/Time   NA 140 02/05/2014 0347   K 3.3* 02/05/2014 0347   CL 100 02/05/2014 0347   CO2 32 02/05/2014 0347   GLUCOSE 215* 02/05/2014 0347   BUN 11 02/05/2014 0347   CREATININE 0.40* 02/05/2014 0347   CREATININE 0.73 03/24/2011 1614   CALCIUM 8.4 02/05/2014 0347   PROT 7.1 01/21/2014 1139   ALBUMIN 3.2* 01/21/2014 1139   AST 30 01/21/2014 1139   ALT 14 01/21/2014 1139   ALKPHOS 57 01/21/2014 1139   BILITOT 0.2* 01/21/2014 1139   GFRNONAA >90 02/05/2014 0347   GFRAA >90 02/05/2014 0347    Assessment and Plan  Dysphagia, unspecified(787.20) When ot was in Ed 8/8 for nasal stuffiness she had a swallow study which showed dysphagia with what was felt to be an esophageal component but also impaired oral, pharyngeal and cervical swallowing. Recommendation was for Dys 3 with nectar thick liquids. Pt initially refused, then  said she would comply but was found drinking out of fountains. Pt has a long hx on non compliance with treatment, and anyone who knows this pt would know that long term cooperation would never occur. I spoke it length with ST and PT. I was asked to write a waiver which I will not as I know she is at high risk for aspiration.  Non compliance with medical treatment Pt with bipolar and undiagnosed personality disorder who functions on the level of a toddler in regards to getting attention and getting her way. Pt has refused to comply with diet necessary to help keep her safe and well.  BIPOLAR DISORDER Pt is on an impressive number and dosage of meds to control her behavoirs and I have not seen improvement, myself. 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  Time spent consulting with staff and review of reports, with pt > 40 min  Hennie Duos, MD

## 2014-07-27 NOTE — ED Notes (Addendum)
Per EMS: Pt is from [facility]. Pt c/o difficulty breathing to staff this morning. Staff wanted to have patient return to room, however patient refused to return to her room. Pt agreed for EMS to administer an albuterol treatment. EMS reports that the patient had diminished breath sounds in all fields. EMS reports the patient has been talking in complete sentences. Pt is A/O and at baseline.  Pt reports being on 2L of oxygen via nasal cannula at the facility. Pt also states that she has had nasal congestion over the past day.

## 2014-07-28 ENCOUNTER — Emergency Department (HOSPITAL_COMMUNITY)
Admission: EM | Admit: 2014-07-28 | Discharge: 2014-07-28 | Disposition: A | Discharge status: Home / Self Care | Payer: Medicare Other | Attending: Emergency Medicine | Admitting: Emergency Medicine

## 2014-07-28 ENCOUNTER — Encounter (HOSPITAL_COMMUNITY): Payer: Self-pay | Admitting: Emergency Medicine

## 2014-07-28 DIAGNOSIS — Z86711 Personal history of pulmonary embolism: Secondary | ICD-10-CM | POA: Diagnosis not present

## 2014-07-28 DIAGNOSIS — Z7982 Long term (current) use of aspirin: Secondary | ICD-10-CM | POA: Insufficient documentation

## 2014-07-28 DIAGNOSIS — Z79899 Other long term (current) drug therapy: Secondary | ICD-10-CM | POA: Diagnosis not present

## 2014-07-28 DIAGNOSIS — F3130 Bipolar disorder, current episode depressed, mild or moderate severity, unspecified: Secondary | ICD-10-CM | POA: Diagnosis not present

## 2014-07-28 DIAGNOSIS — Z87891 Personal history of nicotine dependence: Secondary | ICD-10-CM | POA: Insufficient documentation

## 2014-07-28 DIAGNOSIS — E785 Hyperlipidemia, unspecified: Secondary | ICD-10-CM | POA: Insufficient documentation

## 2014-07-28 DIAGNOSIS — M129 Arthropathy, unspecified: Secondary | ICD-10-CM | POA: Insufficient documentation

## 2014-07-28 DIAGNOSIS — F411 Generalized anxiety disorder: Secondary | ICD-10-CM | POA: Insufficient documentation

## 2014-07-28 DIAGNOSIS — Z8541 Personal history of malignant neoplasm of cervix uteri: Secondary | ICD-10-CM | POA: Insufficient documentation

## 2014-07-28 DIAGNOSIS — Z8701 Personal history of pneumonia (recurrent): Secondary | ICD-10-CM | POA: Diagnosis not present

## 2014-07-28 DIAGNOSIS — Z791 Long term (current) use of non-steroidal anti-inflammatories (NSAID): Secondary | ICD-10-CM | POA: Diagnosis not present

## 2014-07-28 DIAGNOSIS — Z794 Long term (current) use of insulin: Secondary | ICD-10-CM | POA: Insufficient documentation

## 2014-07-28 DIAGNOSIS — I10 Essential (primary) hypertension: Secondary | ICD-10-CM | POA: Diagnosis not present

## 2014-07-28 DIAGNOSIS — E119 Type 2 diabetes mellitus without complications: Secondary | ICD-10-CM | POA: Diagnosis not present

## 2014-07-28 DIAGNOSIS — J449 Chronic obstructive pulmonary disease, unspecified: Secondary | ICD-10-CM | POA: Insufficient documentation

## 2014-07-28 DIAGNOSIS — F419 Anxiety disorder, unspecified: Secondary | ICD-10-CM

## 2014-07-28 DIAGNOSIS — Z862 Personal history of diseases of the blood and blood-forming organs and certain disorders involving the immune mechanism: Secondary | ICD-10-CM | POA: Diagnosis not present

## 2014-07-28 DIAGNOSIS — K219 Gastro-esophageal reflux disease without esophagitis: Secondary | ICD-10-CM | POA: Insufficient documentation

## 2014-07-28 DIAGNOSIS — Z711 Person with feared health complaint in whom no diagnosis is made: Secondary | ICD-10-CM | POA: Diagnosis not present

## 2014-07-28 DIAGNOSIS — Z008 Encounter for other general examination: Secondary | ICD-10-CM | POA: Insufficient documentation

## 2014-07-28 DIAGNOSIS — J4489 Other specified chronic obstructive pulmonary disease: Secondary | ICD-10-CM | POA: Insufficient documentation

## 2014-07-28 DIAGNOSIS — Z86718 Personal history of other venous thrombosis and embolism: Secondary | ICD-10-CM | POA: Diagnosis not present

## 2014-07-28 MED ORDER — ALPRAZOLAM 0.5 MG PO TABS
0.5000 mg | ORAL_TABLET | Freq: Once | ORAL | Status: AC
  Administered 2014-07-28: 0.5 mg via ORAL
  Filled 2014-07-28: qty 1

## 2014-07-28 NOTE — ED Provider Notes (Signed)
Medical screening examination/treatment/procedure(s) were performed by non-physician practitioner and as supervising physician I was immediately available for consultation/collaboration.  Richarda Blade, MD 07/28/14 [phone]

## 2014-07-28 NOTE — Discharge Instructions (Signed)
Return here as needed.  Follow up with her primary care Dr.

## 2014-07-28 NOTE — Progress Notes (Signed)
Report called to Gerald Stabs, LPN at [facility] and rehab. Patient discharged via [facility]

## 2014-07-28 NOTE — ED Provider Notes (Signed)
CSN: [id number]     Arrival date & time 07/28/14  1355 History   First MD Initiated Contact with Patient 07/28/14 1456     Chief Complaint  Patient presents with  . can not eat facility food      (Consider location/radiation/quality/duration/timing/severity/associated sxs/prior Treatment) HPI Patient presents to the emergency department with a complaint about the facility that she lives in this food.  She states, who is not very good, and they wouldn't give her something different that what she was served.  Patient, states, that she also requested ginger ale or water, and they would not give it is either.  Patient is requesting that she get her Xanax that she is to get 2:00.  Patient does not have any other complaints.  Patient, states, that she's also upset because her daughter did not visit her today and has not visited her recently.  Patient, states, that she's not had any chest pain, shortness breath, weakness, dizziness, headache, blurred vision, rash, or syncope Past Medical History  Diagnosis Date  . Anxiety   . Arthritis   . COPD (chronic obstructive pulmonary disease)   . GERD (gastroesophageal reflux disease)   . Hyperlipidemia   . Osteoporosis   . DVT (deep venous thrombosis)   . PE (pulmonary thromboembolism)   . Hypertension      PT DENIES....ON NO  MEDS  . Diabetes mellitus   . Cancer 1969    cervical  . Pneumonia     h/o  . Bronchitis     h/o  . Blood transfusion   . Anemia   . Bipolar affect, depressed    Past Surgical History  Procedure Laterality Date  . Vena cava filter placement    . 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 Eye surgery      CATARACTS  . Achilles tendon lengthening  11/18/11    and repair w/posterior tibial tendon lengthening; right  foot  . Cholecystectomy    . Abdominal hysterectomy  1969    "womb taken out for cervical cancer"  . Cataract extraction, bilateral  ~ 2008  . Tonsillectomy      "as a child"  . Achilles tendon  surgery  11/18/2011    Procedure: ACHILLES TENDON REPAIR;  Surgeon: Wylene Simmer, MD;  Location: [place];  Service: Orthopedics;  Laterality: Right;  Right Posterior Tibial Tendon Lenghtening and Tendon Achilles Lenghtening    Family History  Problem Relation Age of Onset  . Heart disease Father   . Heart attack Father   . Clotting disorder Father   . Stroke Mother    History  Substance Use Topics  . Smoking status: Former Smoker -- 1.00 packs/day    Types: Cigarettes    Quit date: 11/14/1986  . Smokeless tobacco: Never Used     Comment: pt cannot recall the total amt of time she smoked.  . Alcohol Use: No   OB History   Grav Para Term Preterm Abortions TAB SAB Ect Mult Living                 Review of Systems All other systems negative except as documented in the HPI. All pertinent positives and negatives as reviewed in the HPI.    Allergies  Benzodiazepines; Morphine and related; Olanzapine; and Sulfamethoxazole-trimethoprim  Home Medications   Prior to Admission medications   Medication Sig Start Date End Date Taking? Authorizing Provider  acetaminophen (TYLENOL) 325 MG tablet Take 650  mg by mouth every 4 (four) hours as needed for mild pain.    Historical Provider, MD  ALPRAZolam Duanne Moron) 1 MG tablet Take one tablet by mouth three times daily 02/06/14   Estill Dooms, MD  alum & mag hydroxide-simeth (MAALOX/MYLANTA) 200-200-20 MG/5ML suspension Take 30 mLs by mouth every 6 (six) hours as needed for indigestion or heartburn.    Historical Provider, MD  AMBULATORY NON FORMULARY MEDICATION Lorazepam Inj 2mg /ml Sig: Inject 79ml intramuscular every 12 hours for severe anxiety/agitation 07/10/14   Blanchie Serve, MD  antiseptic oral rinse (BIOTENE) LIQD 15 mLs by Mouth Rinse route every 4 (four) hours as needed for dry mouth.    Historical Provider, MD  Artificial Tear Ointment (REFRESH LACRI-LUBE) OINT Place 1 application into both eyes at bedtime.    Historical Provider, MD   aspirin 81 MG chewable tablet Chew 81 mg by mouth daily.    Historical Provider, MD  bisacodyl (DULCOLAX) 10 MG suppository Place 10 mg rectally 3 (three) times a week. On Monday, Wednesday, and Friday    Historical Provider, MD  Cranberry-Vitamin C-Inulin (UTI-STAT) LIQD Take 30 mLs by mouth daily.     Historical Provider, MD  esomeprazole (NEXIUM) 40 MG capsule Take 40 mg by mouth daily at 12 noon.    Historical Provider, MD  fish oil-omega-3 fatty acids 1000 MG capsule Take 1 g by mouth 2 (two) times daily.     Historical Provider, MD  Fluticasone-Salmeterol (ADVAIR DISKUS) 250-50 MCG/DOSE AEPB Inhale 1 puff into the lungs 2 (two) times daily. 03/11/14   Kathee Delton, MD  gabapentin (NEURONTIN) 300 MG capsule Take 3 capsules (900 mg total) by mouth 3 (three) times daily. 01/01/14   Geradine Girt, DO  HYDROcodone-acetaminophen (NORCO/VICODIN) 5-325 MG per tablet Take 1 tablet by mouth every 6 (six) hours as needed for moderate pain. 05/01/14   Blanchie Serve, MD  hydroxypropyl methylcellulose (ISOPTO TEARS) 2.5 % ophthalmic solution Place 1 drop into both eyes 4 (four) times daily as needed for dry eyes.    Historical Provider, MD  insulin aspart (NOVOLOG) 100 UNIT/ML FlexPen Inject 3-5 Units into the skin 3 (three) times daily before meals. Give 3 units before meals with an additional 5 units for CBG> or < 150    Historical Provider, MD  insulin glargine (LANTUS) 100 UNIT/ML injection Inject 0.22 mLs (22 Units total) into the skin daily. 01/01/14   Geradine Girt, DO  levalbuterol (XOPENEX HFA) 45 MCG/ACT inhaler Inhale 1-2 puffs into the lungs every 4 (four) hours as needed for wheezing.    Historical Provider, MD  levalbuterol (XOPENEX) 1.25 MG/0.5ML nebulizer solution Take 1.25 mg by nebulization 4 (four) times daily. 01/01/14   Geradine Girt, DO  loperamide (IMODIUM) 2 MG capsule Take 2 mg by mouth as needed for diarrhea or loose stools.    Historical Provider, MD  loratadine (CLARITIN) 10 MG  tablet Take 10 mg by mouth daily.    Historical Provider, MD  metFORMIN (GLUCOPHAGE) 1000 MG tablet Take 1,000 mg by mouth 2 (two) times daily with a meal.     Historical Provider, MD  metoprolol tartrate (LOPRESSOR) 25 MG tablet Take 1 tablet (25 mg total) by mouth 2 (two) times daily. 01/01/14   Geradine Girt, DO  naproxen sodium (ANAPROX) 220 MG tablet Take 220 mg by mouth 2 (two) times daily with a meal.    Historical Provider, MD  OxyCODONE (OXYCONTIN) 10 mg T12A 12 hr tablet Take one tablet  by mouth every 12 hours. Do not crush 06/25/14   Blanchie Serve, MD  polyethylene glycol (MIRALAX / GLYCOLAX) packet Take 17 g by mouth daily.      Historical Provider, MD  potassium chloride (K-DUR,KLOR-CON) 10 MEQ tablet Take 5 mEq by mouth every evening.  08/25/11   Judithann Sheen, MD  promethazine (PHENERGAN) 25 MG tablet Take 25 mg by mouth every 6 (six) hours as needed. nausea    Historical Provider, MD  QUEtiapine (SEROQUEL) 200 MG tablet Take 200 mg by mouth at bedtime.    Historical Provider, MD  QUEtiapine (SEROQUEL) 50 MG tablet Take 50 mg by mouth daily.    Historical Provider, MD  risperiDONE (RISPERDAL) 1 MG tablet Take 1-2 mg by mouth 2 (two) times daily.    Historical Provider, MD  simvastatin (ZOCOR) 10 MG tablet Take 10 mg by mouth at bedtime.    Historical Provider, MD  sodium phosphate (FLEET) enema Place 1 enema rectally daily as needed (constipation). follow package directions    Historical Provider, MD  tiotropium (SPIRIVA) 18 MCG inhalation capsule Place 1 capsule (18 mcg total) into inhaler and inhale daily. 08/25/11   Judithann Sheen, MD  torsemide (DEMADEX) 20 MG tablet Take 40 mg by mouth every morning.    Historical Provider, MD  traZODone (DESYREL) 150 MG tablet Take 150 mg by mouth at bedtime.    Historical Provider, MD  venlafaxine XR (EFFEXOR-XR) 150 MG 24 hr capsule Take 150 mg by mouth every morning.  01/31/12   Waldemar Dickens, MD   BP 113/99  Pulse 112  Temp(Src) 98.5 F  (36.9 C) (Oral)  Resp 20  SpO2 99% Physical Exam  Nursing note and vitals reviewed. Constitutional: She is oriented to person, place, and time. She appears well-developed and well-nourished. No distress.  HENT:  Head: Normocephalic and atraumatic.  Mouth/Throat: Oropharynx is clear and moist.  Eyes: Pupils are equal, round, and reactive to light.  Neck: Normal range of motion. Neck supple.  Cardiovascular: Normal rate and regular rhythm.   Pulmonary/Chest: Effort normal and breath sounds normal.  Neurological: She is alert and oriented to person, place, and time. She exhibits normal muscle tone. Coordination normal.  Skin: Skin is warm and dry. No rash noted. No erythema.    ED Course  Procedures (including critical care time)   Imaging Review Dg Chest 2 View  07/27/2014   CLINICAL DATA:  Shortness of breath.  EXAM: CHEST  2 VIEW  COMPARISON:  02/02/2014 and 08/07/2012  FINDINGS: Two views of the chest were obtained. Heart and mediastinum are stable. Trachea is midline. Patchy basilar chest densities bilaterally. Upper lungs are clear. Negative for a pneumothorax. No acute bone abnormality. No large pleural effusions.  IMPRESSION: Patchy basilar lung densities most likely represent atelectasis but infection cannot be completely excluded.   Electronically Signed   By: Markus Daft M.D.   On: 07/27/2014 08:59   Patient is here with complaints about the facilities food and the fact that her daughter has not visited her.  Patient will be referred back to her primary care Dr. told to return here as needed.  She is stable here in the emergency department   Brent General, PA-C 07/28/14 1535

## 2014-07-28 NOTE — ED Notes (Addendum)
Pt arrives by EMS, pt resides at [facility], staff called to transport to hospital due to pt can not eat consisty of food. "I called the sheriff dept to go find my daughter, she has a private number and I don't have it" I asked for  Peaches and cottage cheese  And they did not have peaches" "They brought me a grilled cheese , too hard to eat" " I want something to eat, my Xanax and Ativan at 2 and my 6 oclock medicine" Pt has no specific complaints other than can not eat food at facility.

## 2014-08-09 ENCOUNTER — Encounter: Payer: Self-pay | Admitting: Internal Medicine

## 2014-08-09 DIAGNOSIS — R1310 Dysphagia, unspecified: Secondary | ICD-10-CM | POA: Insufficient documentation

## 2014-08-09 DIAGNOSIS — Z91199 Patient's noncompliance with other medical treatment and regimen due to unspecified reason: Secondary | ICD-10-CM | POA: Insufficient documentation

## 2014-08-09 DIAGNOSIS — R1319 Other dysphagia: Secondary | ICD-10-CM | POA: Insufficient documentation

## 2014-08-09 DIAGNOSIS — Z9119 Patient's noncompliance with other medical treatment and regimen: Secondary | ICD-10-CM | POA: Insufficient documentation

## 2014-08-09 NOTE — Assessment & Plan Note (Signed)
When ot was in Ed 8/8 for nasal stuffiness she had a swallow study which showed dysphagia with what was felt to be an esophageal component but also impaired oral, pharyngeal and cervical swallowing. Recommendation was for Dys 3 with nectar thick liquids. Pt initially refused, then said she would comply but was found drinking out of fountains. Pt has a long hx on non compliance with treatment, and anyone who knows this pt would know that long term cooperation would never occur. I spoke it length with ST and PT. I was asked to write a waiver which I will not as I know she is at high risk for aspiration.

## 2014-08-09 NOTE — Assessment & Plan Note (Signed)
Pt is on an impressive number and dosage of meds to control her behavoirs and I have not seen improvement, myself. 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

## 2014-08-09 NOTE — Assessment & Plan Note (Signed)
Pt with bipolar and undiagnosed personality disorder who functions on the level of a toddler in regards to getting attention and getting her way. Pt has refused to comply with diet necessary to help keep her safe and well.

## 2014-08-13 ENCOUNTER — Other Ambulatory Visit: Payer: Self-pay | Admitting: *Deleted

## 2014-08-13 MED ORDER — ALPRAZOLAM 1 MG PO TABS: ORAL_TABLET | ORAL | Status: DC

## 2014-08-13 MED ORDER — OXYCODONE HCL ER 10 MG PO T12A: EXTENDED_RELEASE_TABLET | ORAL | Status: DC

## 2014-08-13 NOTE — Telephone Encounter (Signed)
[facility] 

## 2014-08-29 IMAGING — CR DG CHEST 2V
2 series · 2 of 2 positions shown · non-contrast
Comparison: 02/02/2014 and 08/07/2012

CLINICAL DATA: Shortness of breath.

EXAM:
CHEST  2 VIEW

[w chest lat]
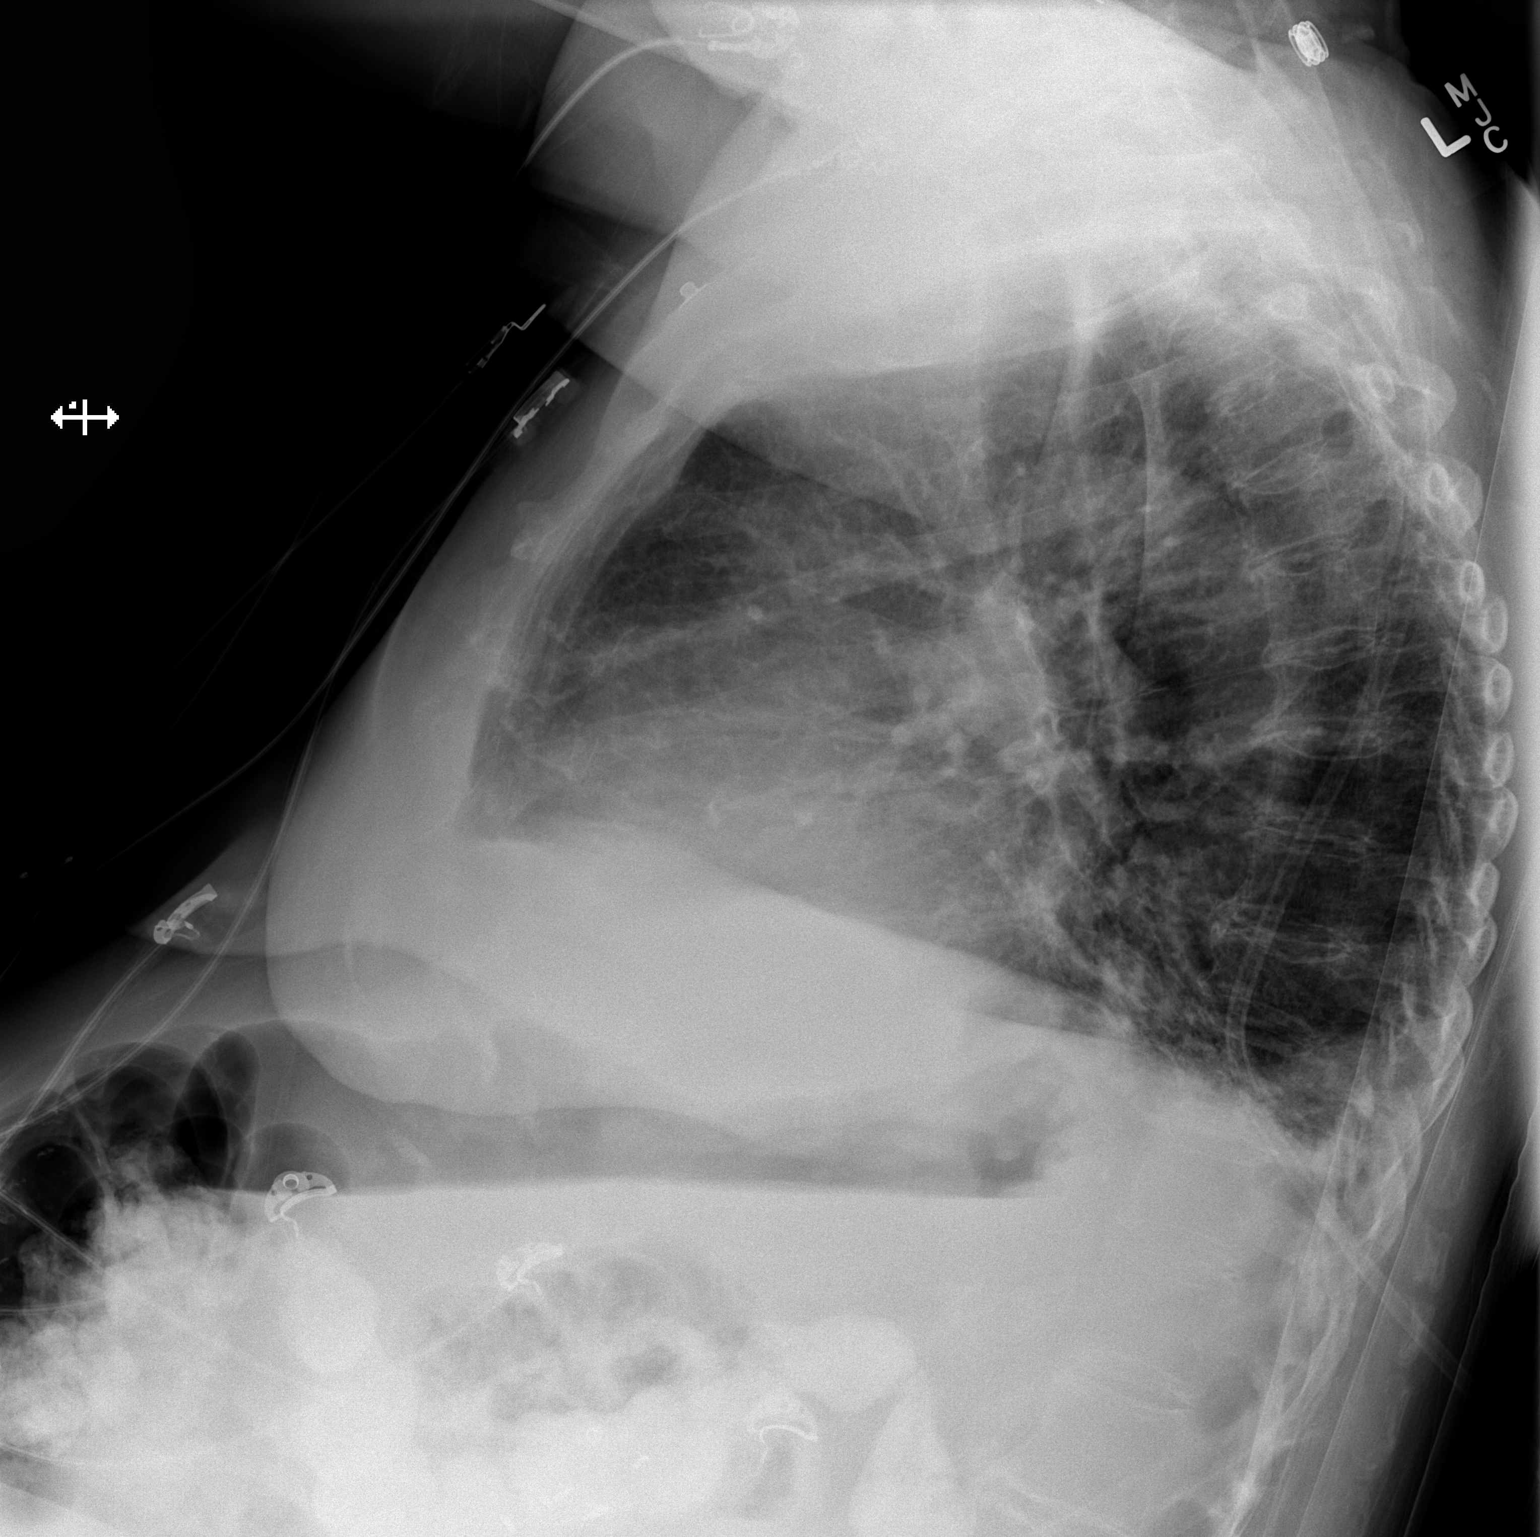

[x chest ap]
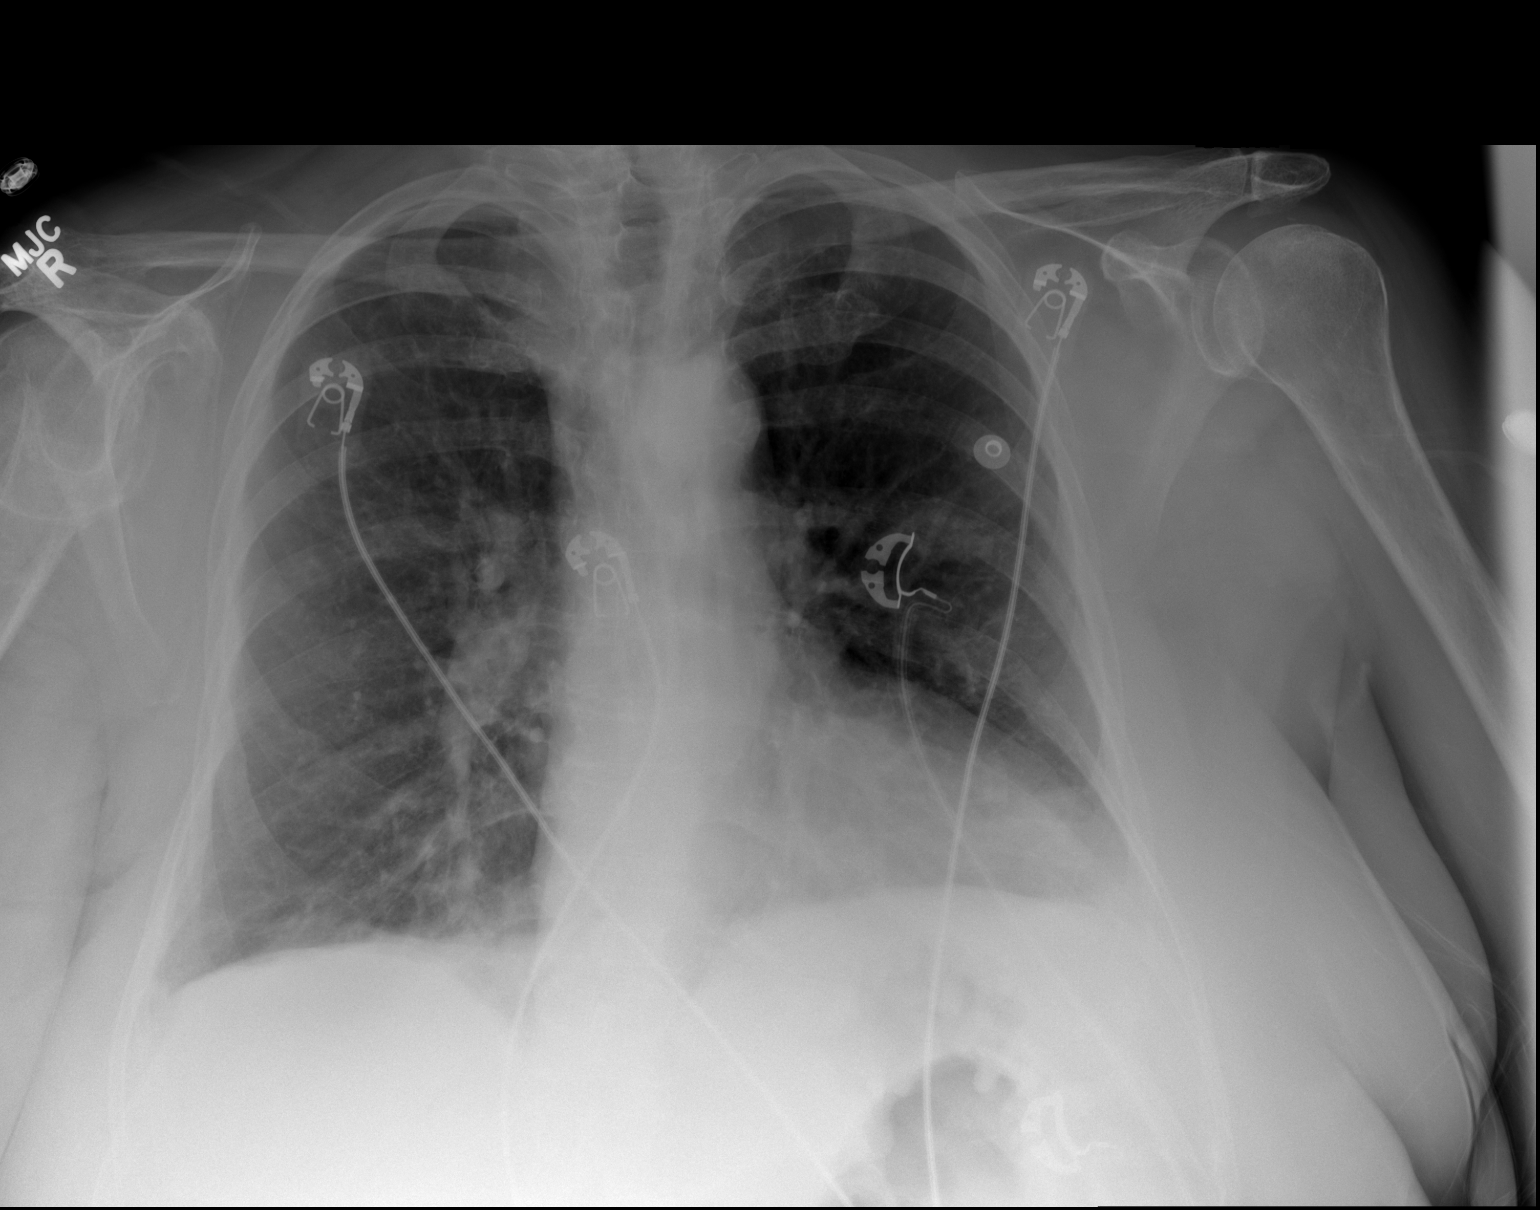

[2 of 2 positions shown; findings below may reference images not displayed]

FINDINGS: Two views of the chest were obtained. Heart and mediastinum are
stable. Trachea is midline. Patchy basilar chest densities
bilaterally. Upper lungs are clear. Negative for a pneumothorax. No
acute bone abnormality. No large pleural effusions.
IMPRESSION: Patchy basilar lung densities most likely represent atelectasis but
infection cannot be completely excluded.

## 2014-09-02 ENCOUNTER — Ambulatory Visit (INDEPENDENT_AMBULATORY_CARE_PROVIDER_SITE_OTHER): Payer: Medicare Other | Admitting: Pulmonary Disease

## 2014-09-02 ENCOUNTER — Encounter: Payer: Self-pay | Admitting: Pulmonary Disease

## 2014-09-02 VITALS — BP 114/68 | HR 110 | Temp 98.4°F | Ht 62.0 in | Wt 190.6 lb

## 2014-09-02 DIAGNOSIS — Z23 Encounter for immunization: Secondary | ICD-10-CM

## 2014-09-02 DIAGNOSIS — J438 Other emphysema: Secondary | ICD-10-CM

## 2014-09-02 DIAGNOSIS — R918 Other nonspecific abnormal finding of lung field: Secondary | ICD-10-CM

## 2014-09-02 DIAGNOSIS — J439 Emphysema, unspecified: Secondary | ICD-10-CM

## 2014-09-02 NOTE — Progress Notes (Signed)
   Subjective:    Patient ID: [name], female    DOB: 1941/05/01, 73 y.o.   MRN: [id number]  HPI Patient comes in today for followup of her known COPD. She is staying on her bronchodilator regimen, and her breathing is near her usual baseline. She has had some increasing shortness of breath associated with episodes of vomiting, and is to be evaluated by gastroenterology next week per her daughter. When she is not vomiting, she is not overly short of breath. She is not bringing up any purulent mucus and has no significant chest congestion. Patient also has a pulmonary nodule that is due for her six-month followup.   Review of Systems  Constitutional: Negative for fever and unexpected weight change.  HENT: Positive for congestion, postnasal drip and rhinorrhea. Negative for dental problem, ear pain, nosebleeds, sinus pressure, sneezing, sore throat and trouble swallowing.   Eyes: Negative for redness and itching.  Respiratory: Positive for cough, chest tightness, shortness of breath and wheezing.   Cardiovascular: Negative for palpitations and leg swelling.  Gastrointestinal: Negative for nausea and vomiting.  Genitourinary: Negative for dysuria.  Musculoskeletal: Negative for joint swelling.  Skin: Negative for rash.  Neurological: Negative for headaches.  Hematological: Does not bruise/bleed easily.  Psychiatric/Behavioral: Negative for dysphoric mood. The patient is not nervous/anxious.        Objective:   Physical Exam Obese female in no acute distress Nose without purulence or discharge noted Neck without lymphadenopathy or thyromegaly Chest with decreased breath sounds, a few basilar crackles, no wheezing Cardiac exam with regular rate and rhythm, 2/6 systolic murmur Lower extremities with 1+ edema, no cyanosis Alert and oriented, moves all 4 extremities.       Assessment & Plan:

## 2014-09-02 NOTE — Patient Instructions (Signed)
Will schedule for a scan of your chest to followup a spot that we have been watching. No change in your breathing medications. Will give you a flu shot today. followup with me again in 58mos.

## 2014-09-02 NOTE — Assessment & Plan Note (Signed)
The patient is due for her six-month followup of her pulmonary nodule. Will schedule her for the appropriate CT chest.

## 2014-09-02 NOTE — Assessment & Plan Note (Signed)
The patient appears to be near her usual baseline from a pulmonary standpoint. She has no bronchospasm on exam, and tells me that her breathing is at baseline except when she has episodes of vomiting. I've asked her to continue on her bronchodilator regimen, and will give her a flu shot today.

## 2014-09-06 ENCOUNTER — Other Ambulatory Visit: Payer: Self-pay | Admitting: *Deleted

## 2014-09-06 MED ORDER — AMBULATORY NON FORMULARY MEDICATION: Status: DC

## 2014-09-06 NOTE — Telephone Encounter (Signed)
[facility] 

## 2014-09-09 ENCOUNTER — Ambulatory Visit: Payer: Self-pay | Admitting: Pulmonary Disease

## 2014-09-12 ENCOUNTER — Emergency Department (HOSPITAL_COMMUNITY): Payer: Medicare Other

## 2014-09-12 ENCOUNTER — Emergency Department (HOSPITAL_COMMUNITY)
Admission: EM | Admit: 2014-09-12 | Discharge: 2014-09-12 | Disposition: A | Discharge status: Home / Self Care | Payer: Medicare Other | Attending: Emergency Medicine | Admitting: Emergency Medicine

## 2014-09-12 ENCOUNTER — Encounter (HOSPITAL_COMMUNITY): Payer: Self-pay | Admitting: Emergency Medicine

## 2014-09-12 ENCOUNTER — Other Ambulatory Visit: Payer: Self-pay | Admitting: *Deleted

## 2014-09-12 DIAGNOSIS — Z86718 Personal history of other venous thrombosis and embolism: Secondary | ICD-10-CM | POA: Diagnosis not present

## 2014-09-12 DIAGNOSIS — Z91199 Patient's noncompliance with other medical treatment and regimen due to unspecified reason: Secondary | ICD-10-CM | POA: Diagnosis not present

## 2014-09-12 DIAGNOSIS — E785 Hyperlipidemia, unspecified: Secondary | ICD-10-CM | POA: Insufficient documentation

## 2014-09-12 DIAGNOSIS — Z794 Long term (current) use of insulin: Secondary | ICD-10-CM | POA: Diagnosis not present

## 2014-09-12 DIAGNOSIS — F411 Generalized anxiety disorder: Secondary | ICD-10-CM | POA: Insufficient documentation

## 2014-09-12 DIAGNOSIS — K219 Gastro-esophageal reflux disease without esophagitis: Secondary | ICD-10-CM | POA: Insufficient documentation

## 2014-09-12 DIAGNOSIS — Z86711 Personal history of pulmonary embolism: Secondary | ICD-10-CM | POA: Diagnosis not present

## 2014-09-12 DIAGNOSIS — M810 Age-related osteoporosis without current pathological fracture: Secondary | ICD-10-CM | POA: Diagnosis not present

## 2014-09-12 DIAGNOSIS — R0602 Shortness of breath: Secondary | ICD-10-CM

## 2014-09-12 DIAGNOSIS — Z9114 Patient's other noncompliance with medication regimen: Secondary | ICD-10-CM

## 2014-09-12 DIAGNOSIS — Z79899 Other long term (current) drug therapy: Secondary | ICD-10-CM | POA: Diagnosis not present

## 2014-09-12 DIAGNOSIS — Z862 Personal history of diseases of the blood and blood-forming organs and certain disorders involving the immune mechanism: Secondary | ICD-10-CM | POA: Diagnosis not present

## 2014-09-12 DIAGNOSIS — F419 Anxiety disorder, unspecified: Secondary | ICD-10-CM

## 2014-09-12 DIAGNOSIS — Z9119 Patient's noncompliance with other medical treatment and regimen: Secondary | ICD-10-CM | POA: Insufficient documentation

## 2014-09-12 DIAGNOSIS — R1033 Periumbilical pain: Secondary | ICD-10-CM | POA: Insufficient documentation

## 2014-09-12 DIAGNOSIS — M129 Arthropathy, unspecified: Secondary | ICD-10-CM | POA: Insufficient documentation

## 2014-09-12 DIAGNOSIS — Z7982 Long term (current) use of aspirin: Secondary | ICD-10-CM | POA: Diagnosis not present

## 2014-09-12 DIAGNOSIS — F3130 Bipolar disorder, current episode depressed, mild or moderate severity, unspecified: Secondary | ICD-10-CM | POA: Diagnosis not present

## 2014-09-12 DIAGNOSIS — R000 Tachycardia, unspecified: Secondary | ICD-10-CM | POA: Insufficient documentation

## 2014-09-12 DIAGNOSIS — I10 Essential (primary) hypertension: Secondary | ICD-10-CM | POA: Insufficient documentation

## 2014-09-12 DIAGNOSIS — E119 Type 2 diabetes mellitus without complications: Secondary | ICD-10-CM | POA: Insufficient documentation

## 2014-09-12 DIAGNOSIS — J441 Chronic obstructive pulmonary disease with (acute) exacerbation: Secondary | ICD-10-CM | POA: Diagnosis not present

## 2014-09-12 DIAGNOSIS — Z8701 Personal history of pneumonia (recurrent): Secondary | ICD-10-CM | POA: Insufficient documentation

## 2014-09-12 DIAGNOSIS — M7989 Other specified soft tissue disorders: Secondary | ICD-10-CM | POA: Diagnosis not present

## 2014-09-12 DIAGNOSIS — IMO0002 Reserved for concepts with insufficient information to code with codable children: Secondary | ICD-10-CM | POA: Insufficient documentation

## 2014-09-12 DIAGNOSIS — Z8541 Personal history of malignant neoplasm of cervix uteri: Secondary | ICD-10-CM | POA: Diagnosis not present

## 2014-09-12 LAB — BASIC METABOLIC PANEL
Anion gap: 15 (ref 5–15)
BUN: 9 mg/dL (ref 6–23)
CALCIUM: 9.4 mg/dL (ref 8.4–10.5)
CO2: 26 mEq/L (ref 19–32)
Chloride: 95 mEq/L — ABNORMAL LOW (ref 96–112)
Creatinine, Ser: 0.59 mg/dL (ref 0.50–1.10)
GFR calc Af Amer: >90 mL/min (ref >90)
GFR calc non Af Amer: 89 mL/min — ABNORMAL LOW (ref >90)
GLUCOSE: 207 mg/dL — AB (ref 70–99)
Potassium: 4.6 mEq/L (ref 3.7–5.3)
Sodium: 136 mEq/L — ABNORMAL LOW (ref 137–147)

## 2014-09-12 LAB — PRO B NATRIURETIC PEPTIDE: Pro B Natriuretic peptide (BNP): 449.7 pg/mL — ABNORMAL HIGH (ref 0–125)

## 2014-09-12 LAB — CBC
HCT: 38.5 % (ref 36.0–46.0)
HEMOGLOBIN: 12.1 g/dL (ref 12.0–15.0)
MCH: 28.3 pg (ref 26.0–34.0)
MCHC: 31.4 g/dL (ref 30.0–36.0)
MCV: 90.2 fL (ref 78.0–100.0)
PLATELETS: 226 10*3/uL (ref 150–400)
RBC: 4.27 MIL/uL (ref 3.87–5.11)
RDW: 14.3 % (ref 11.5–15.5)
WBC: 8.4 10*3/uL (ref 4.0–10.5)

## 2014-09-12 MED ORDER — LORAZEPAM 2 MG/ML IJ SOLN
1.0000 mg | Freq: Once | INTRAMUSCULAR | Status: AC
  Administered 2014-09-12: 1 mg via INTRAMUSCULAR
  Filled 2014-09-12: qty 1

## 2014-09-12 MED ORDER — ALPRAZOLAM 0.5 MG PO TABS
1.0000 mg | ORAL_TABLET | Freq: Once | ORAL | Status: AC
  Administered 2014-09-12: 1 mg via ORAL
  Filled 2014-09-12: qty 2

## 2014-09-12 MED ORDER — OXYCODONE HCL ER 10 MG PO T12A: EXTENDED_RELEASE_TABLET | ORAL | Status: DC

## 2014-09-12 MED ORDER — TORSEMIDE 20 MG PO TABS
40.0000 mg | ORAL_TABLET | Freq: Every day | ORAL | Status: DC
  Administered 2014-09-12: 40 mg via ORAL
  Filled 2014-09-12: qty 2

## 2014-09-12 NOTE — ED Notes (Signed)
Pt in room upset, states she hasn't taken her fluid pill x 5 days, states the reason for that is the staff does not come to help her to the restroom when she calls, states she has to go a lot w/ the fluid pill and has no help, pt states she needs the fluid off, pt is currently on 2L [place] and that is what she wears at home all time, lungs clear, edema noted to lower extremities.

## 2014-09-12 NOTE — ED Notes (Signed)
Bed: WA24 Expected date:  Expected time:  Means of arrival:  Comments: EMS 

## 2014-09-12 NOTE — Telephone Encounter (Signed)
[facility] 

## 2014-09-12 NOTE — ED Notes (Signed)
Patient transported to X-ray 

## 2014-09-12 NOTE — ED Provider Notes (Signed)
CSN: [id number]     Arrival date & time 09/12/14  1457 History   First MD Initiated Contact with Patient 09/12/14 1503     Chief Complaint  Patient presents with  . noncompliant w/ medications   . Shortness of Breath     (Consider location/radiation/quality/duration/timing/severity/associated sxs/prior Treatment) Patient is a 73 y.o. female presenting with shortness of breath. The history is provided by the patient and medical records. No language interpreter was used.  Shortness of Breath Associated symptoms: no abdominal pain, no chest pain, no cough, no diaphoresis, no fever, no headaches, no rash, no vomiting and no wheezing     [name] is a 73 y.o. female  with a hx of anxiety, arthritis, COPD, GERD, DVT, PE, IDDM, pneumonia, anemia presents to the Emergency Department complaining of gradual, persistent, progressively worsening shortness of breath onset 2 days ago. Patient reports she began refusing her diuretic 5 days ago because it is too much effort for her to leave the common area to go back to her around to go to the bathroom. She reports she's been taking her other medications. Associated symptoms include swelling in her legs, shortness of breath. Patient wears oxygen at home, 2 L via nasal cannula, at all times.  Patient has been wearing TED hose with some relief of swelling in her lower legs. No other aggravating or alleviating factors. Patient is wheelchair-bound. Pt denies fever, chills, headache, neck pain, chest pain abdominal pain, nausea, vomiting, diarrhea, weakness, dizziness, syncope.     Past Medical History  Diagnosis Date  . Anxiety   . Arthritis   . COPD (chronic obstructive pulmonary disease)   . GERD (gastroesophageal reflux disease)   . Hyperlipidemia   . Osteoporosis   . DVT (deep venous thrombosis)   . PE (pulmonary thromboembolism)   . Hypertension      PT DENIES....ON NO  MEDS  . Diabetes mellitus   . Cancer 1969    cervical  . Pneumonia      h/o  . Bronchitis     h/o  . Blood transfusion   . Anemia   . Bipolar affect, depressed    Past Surgical History  Procedure Laterality Date  . Vena cava filter placement    . 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 Eye surgery      CATARACTS  . Achilles tendon lengthening  11/18/11    and repair w/posterior tibial tendon lengthening; right  foot  . Cholecystectomy    . Abdominal hysterectomy  1969    "womb taken out for cervical cancer"  . Cataract extraction, bilateral  ~ 2008  . Tonsillectomy      "as a child"  . Achilles tendon surgery  11/18/2011    Procedure: ACHILLES TENDON REPAIR;  Surgeon: Wylene Simmer, MD;  Location: [facility];  Service: Orthopedics;  Laterality: Right;  Right Posterior Tibial Tendon Lenghtening and Tendon Achilles Lenghtening    Family History  Problem Relation Age of Onset  . Heart disease Father   . Heart attack Father   . Clotting disorder Father   . Stroke Mother    History  Substance Use Topics  . Smoking status: Former Smoker -- 1.00 packs/day    Types: Cigarettes    Quit date: 11/14/1986  . Smokeless tobacco: Never Used     Comment: pt cannot recall the total amt of time she smoked.  . Alcohol Use: No   OB History  Grav Para Term Preterm Abortions TAB SAB Ect Mult Living                 Review of Systems  Constitutional: Negative for fever, diaphoresis, appetite change, fatigue and unexpected weight change.  HENT: Negative for mouth sores.   Eyes: Negative for visual disturbance.  Respiratory: Positive for shortness of breath. Negative for cough, chest tightness and wheezing.   Cardiovascular: Positive for leg swelling. Negative for chest pain.  Gastrointestinal: Negative for nausea, vomiting, abdominal pain, diarrhea and constipation.  Endocrine: Negative for polydipsia, polyphagia and polyuria.  Genitourinary: Negative for dysuria, urgency, frequency and hematuria.  Musculoskeletal: Negative for back pain and neck stiffness.   Skin: Negative for rash.  Allergic/Immunologic: Negative for immunocompromised state.  Neurological: Negative for syncope, light-headedness and headaches.  Hematological: Does not bruise/bleed easily.  Psychiatric/Behavioral: Negative for sleep disturbance. The patient is not nervous/anxious.       Allergies  Benzodiazepines; Morphine and related; Olanzapine; and Sulfamethoxazole-trimethoprim  Home Medications   Prior to Admission medications   Medication Sig Start Date End Date Taking? Authorizing Provider  acetaminophen (TYLENOL) 325 MG tablet Take 650 mg by mouth every 4 (four) hours as needed for mild pain.   Yes Historical Provider, MD  ALPRAZolam Duanne Moron) 1 MG tablet Take 1 mg by mouth 2 (two) times daily. Take at 0600, 1000, 1400, and 1800. 08/13/14  Yes Blanchie Serve, MD  alum & mag hydroxide-simeth (MAALOX/MYLANTA) 200-200-20 MG/5ML suspension Take 30 mLs by mouth every 6 (six) hours as needed for indigestion or heartburn.   Yes Historical Provider, MD  antiseptic oral rinse (BIOTENE) LIQD 15 mLs by Mouth Rinse route every 4 (four) hours as needed for dry mouth.   Yes Historical Provider, MD  Artificial Tear Ointment (REFRESH LACRI-LUBE) OINT Place 1 application into both eyes at bedtime.   Yes Historical Provider, MD  aspirin 81 MG chewable tablet Chew 81 mg by mouth daily.   Yes Historical Provider, MD  benzocaine-menthol (CHLORASEPTIC) 6-10 MG lozenge Take 1 lozenge by mouth every 2 (two) hours as needed for sore throat.   Yes Historical Provider, MD  bisacodyl (DULCOLAX) 10 MG suppository Place 10 mg rectally as directed. On Monday, Wednesday, and Friday and daily as needed if no bowel movement in 2 days.   Yes Historical Provider, MD  Cranberry-Vitamin C-Inulin (UTI-STAT) LIQD Take 30 mLs by mouth daily.    Yes Historical Provider, MD  esomeprazole (NEXIUM) 40 MG capsule Take 40 mg by mouth daily at 12 noon.   Yes Historical Provider, MD  fenofibrate (TRICOR) 145 MG tablet  Take 145 mg by mouth daily.   Yes Historical Provider, MD  fish oil-omega-3 fatty acids 1000 MG capsule Take 1 g by mouth 2 (two) times daily.    Yes Historical Provider, MD  Fluticasone-Salmeterol (ADVAIR DISKUS) 250-50 MCG/DOSE AEPB Inhale 1 puff into the lungs 2 (two) times daily. 03/11/14  Yes Kathee Delton, MD  gabapentin (NEURONTIN) 300 MG capsule Take 3 capsules (900 mg total) by mouth 3 (three) times daily. 01/01/14  Yes Geradine Girt, DO  HYDROcodone-acetaminophen (NORCO/VICODIN) 5-325 MG per tablet Take 1 tablet by mouth every 6 (six) hours as needed for moderate pain. 05/01/14  Yes Mahima Pandey, MD  insulin aspart (NOVOLOG) 100 UNIT/ML FlexPen Inject 3-5 Units into the skin 3 (three) times daily before meals. Give 3 units before meals with an additional 5 units for CBG> or < 150   Yes Historical Provider, MD  insulin glargine (  LANTUS) 100 UNIT/ML injection Inject 22 Units into the skin at bedtime. 01/01/14  Yes Geradine Girt, DO  lamoTRIgine (LAMICTAL) 25 MG tablet Take 50 mg by mouth daily.   Yes Historical Provider, MD  levalbuterol [facility] HFA) 45 MCG/ACT inhaler Inhale 2 puffs into the lungs every 4 (four) hours as needed for wheezing.    Yes Historical Provider, MD  levalbuterol (XOPENEX) 1.25 MG/0.5ML nebulizer solution Take 1.25 mg by nebulization 4 (four) times daily as needed for wheezing or shortness of breath. 01/01/14  Yes Geradine Girt, DO  loperamide (IMODIUM) 2 MG capsule Take 2 mg by mouth as needed for diarrhea or loose stools.   Yes Historical Provider, MD  loratadine (CLARITIN) 10 MG tablet Take 10 mg by mouth daily.   Yes Historical Provider, MD  LORazepam (ATIVAN) 2 MG/ML concentrated solution Take 1 mg by mouth every 12 (twelve) hours as needed for anxiety (and agitation.).   Yes Historical Provider, MD  magnesium hydroxide (MILK OF MAGNESIA) 400 MG/5ML suspension Take 30 mLs by mouth daily as needed for mild constipation.   Yes Historical Provider, MD  metFORMIN  (GLUCOPHAGE) 1000 MG tablet Take 1,000 mg by mouth 2 (two) times daily with a meal.    Yes Historical Provider, MD  metoprolol tartrate (LOPRESSOR) 25 MG tablet Take 25 mg by mouth 2 (two) times daily. At 0800 and 1700. 01/01/14  Yes Geradine Girt, DO  naproxen sodium (ANAPROX) 220 MG tablet Take 220 mg by mouth 2 (two) times daily with a meal.   Yes Historical Provider, MD  OxyCODONE (OXYCONTIN) 10 mg T12A 12 hr tablet Take 10 mg by mouth every 12 (twelve) hours. Take one tablet by mouth every 12 hours. Do not crush 09/12/14  Yes Lauree Chandler, NP  OXYGEN Inhale 2 L into the lungs continuous. Via nasal cannula.   Yes Historical Provider, MD  Polyethyl Glycol-Propyl Glycol (SYSTANE) 0.4-0.3 % SOLN Place 1 drop into both eyes 4 (four) times daily.   Yes Historical Provider, MD  polyethylene glycol (MIRALAX / GLYCOLAX) packet Take 17 g by mouth daily.    Yes Historical Provider, MD  polyvinyl alcohol (LIQUIFILM TEARS) 1.4 % ophthalmic solution Place 2 drops into both eyes 4 (four) times daily as needed for dry eyes.   Yes Historical Provider, MD  potassium chloride (K-DUR,KLOR-CON) 10 MEQ tablet Take 5 mEq by mouth every evening.  08/25/11  Yes Judithann Sheen, MD  promethazine (PHENERGAN) 25 MG tablet Take 25 mg by mouth every 6 (six) hours as needed. nausea   Yes Historical Provider, MD  QUEtiapine (SEROQUEL) 300 MG tablet Take 150 mg by mouth at bedtime.   Yes Historical Provider, MD  risperiDONE (RISPERDAL) 2 MG tablet Take 2 mg by mouth 2 (two) times daily.   Yes Historical Provider, MD  simvastatin (ZOCOR) 10 MG tablet Take 10 mg by mouth daily at 6 PM.    Yes Historical Provider, MD  sodium phosphate (FLEET) enema Place 1 enema rectally daily as needed (constipation). follow package directions   Yes Historical Provider, MD  tiotropium (SPIRIVA) 18 MCG inhalation capsule Place 1 capsule (18 mcg total) into inhaler and inhale daily. 08/25/11  Yes Judithann Sheen, MD  torsemide (DEMADEX) 20 MG  tablet Take 60 mg by mouth daily.    Yes Historical Provider, MD  traZODone (DESYREL) 150 MG tablet Take 150 mg by mouth at bedtime.   Yes Historical Provider, MD  trimethoprim (TRIMPEX) 100 MG tablet Take 100  mg by mouth daily.   Yes Historical Provider, MD  venlafaxine XR (EFFEXOR-XR) 150 MG 24 hr capsule Take 300 mg by mouth every morning.  01/31/12  Yes Waldemar Dickens, MD   BP 157/136  Pulse 117  Temp(Src) 98.1 F (36.7 C) (Oral)  Resp 14  SpO2 100% Physical Exam  Nursing note and vitals reviewed. Constitutional: She appears well-developed and well-nourished. No distress.  Awake, alert, nontoxic appearance  HENT:  Head: Normocephalic and atraumatic.  Mouth/Throat: Oropharynx is clear and moist. No oropharyngeal exudate.  Eyes: Conjunctivae are normal. No scleral icterus.  Neck: Normal range of motion. Neck supple.  Cardiovascular: Normal rate, regular rhythm, normal heart sounds and intact distal pulses.   No murmur heard. Pulmonary/Chest: Effort normal. No accessory muscle usage. Not tachypneic. No respiratory distress. She has decreased breath sounds. She has no wheezes.  Equal chest expansion Diminished breath sounds throughout without focal wheezes, rhonchi or rales; patient without accessory muscle use or tachypnea  Abdominal: Soft. Bowel sounds are normal. She exhibits no mass. There is no tenderness. There is no rebound and no guarding.  Musculoskeletal: Normal range of motion. She exhibits edema (1+, pitting, lower extremities).  Neurological: She is alert. She exhibits normal muscle tone. Coordination normal.  Speech is clear and goal oriented Moves extremities without ataxia  Skin: Skin is warm and dry. She is not diaphoretic. No erythema.  Psychiatric: Her mood appears anxious.  Patient anxious and tearful, sobbing in the room    ED Course  Procedures (including critical care time) Labs Review Labs Reviewed  BASIC METABOLIC PANEL - Abnormal; Notable for the  following:    Sodium 136 (*)    Chloride 95 (*)    Glucose, Bld 207 (*)    GFR calc non Af Amer 89 (*)    All other components within normal limits  PRO B NATRIURETIC PEPTIDE - Abnormal; Notable for the following:    Pro B Natriuretic peptide (BNP) 449.7 (*)    All other components within normal limits  CBC    Imaging Review Dg Chest 2 View  09/12/2014   CLINICAL DATA:  Difficulty breathing  EXAM: CHEST  2 VIEW  COMPARISON:  July 27, 2014  FINDINGS: There is scarring in the left base region. Elsewhere lungs are clear. Heart size and pulmonary vascularity are normal. No adenopathy. There is evidence of old trauma in the proximal right humerus with remodeling, stable.  IMPRESSION: Scarring left base.  No edema or consolidation.   Electronically Signed   By: Lowella Grip M.D.   On: 09/12/2014 15:43     EKG Interpretation   Date/Time:  Thursday September 12 2014 17:40:57 EDT Ventricular Rate:  118 PR Interval:  161 QRS Duration: 90 QT Interval:  322 QTC Calculation: 451 R Axis:   64 Text Interpretation:  Sinus tachycardia nonspecific STs appear improved  compared to Feb 2015 Confirmed by GOLDSTON  MD, SCOTT [phone]) on 09/12/2014  8:06:20 PM      MDM   Final diagnoses:  SOB (shortness of breath)  Noncompliance with medications  Anxiety  Tachycardia  Leg swelling   [name] presents with shortness of breath 5 days after cessation of her loop diuretic.  Patient without hypoxia or rails to suggest pulmonary edema. 1+ pitting edema of the lower extremities.  Patient is anxious and crying. Will give her home dose of Xanax. Labs, chest x-ray pending.  Patient is afebrile, non-tachycardic and without hypoxia on her baseline oxygen usage.  5:30PM Chest x-ray with scarring in the left base but no pulmonary edema or pneumonia. BNP less than 500, all of her labs reassuring.  Patient without hypoxia or increased work of breathing.  No fever or leukocytosis.  Clear and equal  breath sounds. No evidence of COPD exacerbation today.  Patient with tachycardia, however patient has been crying and screaming throughout her time here in the emergency department.  She has been given her home Xanax and Ativan while here in the emergency department.  She remains upset about diuretic and her lack of easy access to the restroom at her living facility.  Patient tachycardia likely secondary to her anxiety as she is in no physical distress.  BP 157/136  Pulse 117  Temp(Src) 98.1 F (36.7 C) (Oral)  Resp 14  SpO2 100%  Patient is to return to her facility and take her diuretic as directed.  She is to followup with her primary care physician within 3 days.  The patient was discussed with and seen by Dr. Regenia Skeeter who agrees with the treatment plan.    Jarrett Soho Danthony Kendrix, PA-C 09/12/14 2007  Christina Waldrop, PA-C 09/12/14 2008

## 2014-09-12 NOTE — ED Notes (Signed)
[facility] called to transport pt back to [facility].  

## 2014-09-12 NOTE — Discharge Instructions (Signed)
1. Medications: usual home medications 2. Treatment: rest, drink plenty of fluids, take your diuretic as directed 3. Follow Up: Please followup with your primary doctor in 3 days for discussion of your diagnoses and further evaluation after today's visit; return to the emergency department for worsening SOB    Edema Edema is an abnormal buildup of fluids in your bodytissues. Edema is somewhatdependent on gravity to pull the fluid to the lowest place in your body. That makes the condition more common in the legs and thighs (lower extremities). Painless swelling of the feet and ankles is common and becomes more likely as you get older. It is also common in looser tissues, like around your eyes.  When the affected area is squeezed, the fluid may move out of that spot and leave a dent for a few moments. This dent is called pitting.  CAUSES  There are many possible causes of edema. Eating too much salt and being on your feet or sitting for a long time can cause edema in your legs and ankles. Hot weather may make edema worse. Common medical causes of edema include:  Heart failure.  Liver disease.  Kidney disease.  Weak blood vessels in your legs.  Cancer.  An injury.  Pregnancy.  Some medications.  Obesity. SYMPTOMS  Edema is usually painless.Your skin may look swollen or shiny.  DIAGNOSIS  Your health care provider may be able to diagnose edema by asking about your medical history and doing a physical exam. You may need to have tests such as X-rays, an electrocardiogram, or blood tests to check for medical conditions that may cause edema.  TREATMENT  Edema treatment depends on the cause. If you have heart, liver, or kidney disease, you need the treatment appropriate for these conditions. General treatment may include:  Elevation of the affected body part above the level of your heart.  Compression of the affected body part. Pressure from elastic bandages or support stockings  squeezes the tissues and forces fluid back into the blood vessels. This keeps fluid from entering the tissues.  Restriction of fluid and salt intake.  Use of a water pill (diuretic). These medications are appropriate only for some types of edema. They pull fluid out of your body and make you urinate more often. This gets rid of fluid and reduces swelling, but diuretics can have side effects. Only use diuretics as directed by your health care provider. HOME CARE INSTRUCTIONS   Keep the affected body part above the level of your heart when you are lying down.   Do not sit still or stand for prolonged periods.   Do not put anything directly under your knees when lying down.  Do not wear constricting clothing or garters on your upper legs.   Exercise your legs to work the fluid back into your blood vessels. This may help the swelling go down.   Wear elastic bandages or support stockings to reduce ankle swelling as directed by your health care provider.   Eat a low-salt diet to reduce fluid if your health care provider recommends it.   Only take medicines as directed by your health care provider. SEEK MEDICAL CARE IF:   Your edema is not responding to treatment.  You have heart, liver, or kidney disease and notice symptoms of edema.  You have edema in your legs that does not improve after elevating them.   You have sudden and unexplained weight gain. SEEK IMMEDIATE MEDICAL CARE IF:   You develop shortness of  breath or chest pain.   You cannot breathe when you lie down.  You develop pain, redness, or warmth in the swollen areas.   You have heart, liver, or kidney disease and suddenly get edema.  You have a fever and your symptoms suddenly get worse. MAKE SURE YOU:   Understand these instructions.  Will watch your condition.  Will get help right away if you are not doing well or get worse. Document Released: 12/06/2005 Document Revised: 04/22/2014 Document  Reviewed: 09/28/2013 [facility] Patient Information 2015 [place], [place]. This information is not intended to replace advice given to you by your health care provider. Make sure you discuss any questions you have with your health care provider.

## 2014-09-12 NOTE — ED Notes (Signed)
Per EMS pt from [facility], staff called stating pt is non-compliant w/ medications, also states pt complaining of shortness of breath. Pt yelling in room "someone please help me".

## 2014-09-17 ENCOUNTER — Other Ambulatory Visit: Payer: Self-pay | Admitting: *Deleted

## 2014-09-17 MED ORDER — LORAZEPAM 2 MG/ML IJ SOLN: INTRAMUSCULAR | Status: DC

## 2014-09-17 NOTE — ED Provider Notes (Signed)
Medical screening examination/treatment/procedure(s) were conducted as a shared visit with non-physician practitioner(s) and myself.  I personally evaluated the patient during the encounter.   EKG Interpretation   Date/Time:  Thursday September 12 2014 17:40:57 EDT Ventricular Rate:  118 PR Interval:  161 QRS Duration: 90 QT Interval:  322 QTC Calculation: 451 R Axis:   64 Text Interpretation:  Sinus tachycardia nonspecific STs appear improved  compared to Feb 2015 Confirmed by Jadalyn Oliveri  MD, Merlin Ege ([id number]) on 09/12/2014  8:06:20 PM       Patient with shortness of breath and leg swelling after not taking her Lasix. Currently she appears anxious but has a long-standing history of this. Seems improved after getting her home benzodiazepines. BNP is reassuring her chest x-ray shows no new pneumonia or edema. At this point I feel she is stable for discharge and discussed the importance of taking her Lasix as prescribed.  Ephraim Hamburger, MD 09/17/14 [id number]

## 2014-09-17 NOTE — Telephone Encounter (Signed)
[facility] 

## 2014-09-23 ENCOUNTER — Other Ambulatory Visit: Payer: Self-pay

## 2014-09-25 ENCOUNTER — Other Ambulatory Visit: Payer: Self-pay

## 2014-09-28 ENCOUNTER — Non-Acute Institutional Stay (SKILLED_NURSING_FACILITY): Payer: Medicare Other | Admitting: Internal Medicine

## 2014-09-28 ENCOUNTER — Encounter: Payer: Self-pay | Admitting: Internal Medicine

## 2014-09-28 DIAGNOSIS — R1319 Other dysphagia: Secondary | ICD-10-CM

## 2014-09-28 DIAGNOSIS — I10 Essential (primary) hypertension: Secondary | ICD-10-CM

## 2014-09-28 DIAGNOSIS — E1340 Other specified diabetes mellitus with diabetic neuropathy, unspecified: Secondary | ICD-10-CM | POA: Insufficient documentation

## 2014-09-28 DIAGNOSIS — R1312 Dysphagia, oropharyngeal phase: Secondary | ICD-10-CM

## 2014-09-28 DIAGNOSIS — K219 Gastro-esophageal reflux disease without esophagitis: Secondary | ICD-10-CM

## 2014-09-28 DIAGNOSIS — F319 Bipolar disorder, unspecified: Secondary | ICD-10-CM

## 2014-09-28 DIAGNOSIS — E1169 Type 2 diabetes mellitus with other specified complication: Secondary | ICD-10-CM

## 2014-09-28 DIAGNOSIS — E1159 Type 2 diabetes mellitus with other circulatory complications: Secondary | ICD-10-CM

## 2014-09-28 DIAGNOSIS — E669 Obesity, unspecified: Secondary | ICD-10-CM

## 2014-09-28 DIAGNOSIS — E1165 Type 2 diabetes mellitus with hyperglycemia: Secondary | ICD-10-CM

## 2014-09-28 DIAGNOSIS — R1314 Dysphagia, pharyngoesophageal phase: Secondary | ICD-10-CM

## 2014-09-28 DIAGNOSIS — M7989 Other specified soft tissue disorders: Secondary | ICD-10-CM

## 2014-09-28 DIAGNOSIS — E119 Type 2 diabetes mellitus without complications: Secondary | ICD-10-CM

## 2014-09-28 DIAGNOSIS — R1310 Dysphagia, unspecified: Secondary | ICD-10-CM

## 2014-09-28 DIAGNOSIS — IMO0002 Reserved for concepts with insufficient information to code with codable children: Secondary | ICD-10-CM

## 2014-09-28 DIAGNOSIS — E1149 Type 2 diabetes mellitus with other diabetic neurological complication: Secondary | ICD-10-CM

## 2014-09-28 DIAGNOSIS — E1141 Type 2 diabetes mellitus with diabetic mononeuropathy: Secondary | ICD-10-CM

## 2014-09-28 NOTE — Assessment & Plan Note (Signed)
Pt is on scheduled xanax, effexor, lamictal, just increased form 50 mg to 100mg , seroquel, just decreased from 150 to 100 mg, risperdol and trazodone and is barely controlled, often not, - not for lack of effort on the part of psych- so she has Ativan gel prn as well.

## 2014-09-28 NOTE — Assessment & Plan Note (Signed)
Pt on nexium and multiple constipation/diarrhea prns

## 2014-09-28 NOTE — Assessment & Plan Note (Signed)
Pt was seen by Dr [name] 09/09/2014, presumably for vomiting, which was reported to be increased since pt was on thickened liquids. Pt has been non compliant with thickened liquids so unlikely cause of any vomiting. The vomiting described to Dr [name] is at odds with the reality at [facility]. In any case what [name] wanted was for someone to give her permission to drink thin liquids since I wouldn't based on her study and Dr [name] did that. I signed off as long as Dr [name] was taking responsibility and the pt and family know the risk of aspiration , which they do. However, once [name] has an aspiration PNA it is highly unlikely that she or her family will take responsibility for their actions. The pt has a long hx of non compliance in any case

## 2014-09-28 NOTE — Assessment & Plan Note (Signed)
Waxes and wanes with her demands for more or less diuretic but can be controlled when pt allows

## 2014-09-28 NOTE — Progress Notes (Signed)
MRN: [id number] Name: [name]  Sex: female Age: 73 y.o. DOB: 11-29-1941  [place] #: [facility] Facility/Room:  Level Of Care: SNF Provider: Inocencio Homes D Emergency Contacts: Extended Emergency Contact Information Primary Emergency Contact: Wright,[name] Address: [place], [place] [address] [place] Work Phone: [phone] Mobile Phone: [phone] Relation: Daughter Secondary Emergency Contact: Rachelle Hora [place] [address] Phone: [phone] Relation: Sister  Code Status: FULL  Allergies: Benzodiazepines; Morphine and related; Olanzapine; and Sulfamethoxazole-trimethoprim  Chief Complaint  Patient presents with  . Medical Management of Chronic Issues    HPI: Patient is 73 y.o. female who is being seen for routine issues.Literally every day I am at [employer] the pt is at the nurses stations with her c/o and demands so she is well known to me.  Past Medical History  Diagnosis Date  . Anxiety   . Arthritis   . COPD (chronic obstructive pulmonary disease)   . GERD (gastroesophageal reflux disease)   . Hyperlipidemia   . Osteoporosis   . DVT (deep venous thrombosis)   . PE (pulmonary thromboembolism)   . Hypertension      PT DENIES....ON NO  MEDS  . Diabetes mellitus   . Cancer 1969    cervical  . Pneumonia     h/o  . Bronchitis     h/o  . Blood transfusion   . Anemia   . Bipolar affect, depressed     Past Surgical History  Procedure Laterality Date  . Vena cava filter placement    . 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 Eye surgery      CATARACTS  . Achilles tendon lengthening  11/18/11    and repair w/posterior tibial tendon lengthening; right  foot  . Cholecystectomy    . Abdominal hysterectomy  1969    "womb taken out for cervical cancer"  . Cataract extraction, bilateral  ~ 2008  . Tonsillectomy      "as a child"  . Achilles tendon surgery  11/18/2011    Procedure: ACHILLES  TENDON REPAIR;  Surgeon: Wylene Simmer, MD;  Location: [facility];  Service: Orthopedics;  Laterality: Right;  Right Posterior Tibial Tendon Lenghtening and Tendon Achilles Lenghtening       Medication List       This list is accurate as of: 09/28/14  1:51 PM.  Always use your most recent med list.               acetaminophen 325 MG tablet  Commonly known as:  TYLENOL  Take 650 mg by mouth every 4 (four) hours as needed for mild pain.     ALPRAZolam 1 MG tablet  Commonly known as:  XANAX  Take 1 mg by mouth 2 (two) times daily. Take at 0600, 1000, 1400, and 1800.     alum & mag hydroxide-simeth 200-200-20 MG/5ML suspension  Commonly known as:  MAALOX/MYLANTA  Take 30 mLs by mouth every 6 (six) hours as needed for indigestion or heartburn.     antiseptic oral rinse Liqd  15 mLs by Mouth Rinse route every 4 (four) hours as needed for dry mouth.     aspirin 81 MG chewable tablet  Chew 81 mg by mouth daily.     bisacodyl 10 MG suppository  Commonly known as:  DULCOLAX  Place 10 mg rectally as directed. On Monday, Wednesday, and Friday  and daily as needed if no bowel movement in 2 days.     CHLORASEPTIC 6-10 MG lozenge  Generic drug:  benzocaine-menthol  Take 1 lozenge by mouth every 2 (two) hours as needed for sore throat.     esomeprazole 40 MG capsule  Commonly known as:  NEXIUM  Take 40 mg by mouth daily at 12 noon.     fenofibrate 145 MG tablet  Commonly known as:  TRICOR  Take 145 mg by mouth daily.     fish oil-omega-3 fatty acids 1000 MG capsule  Take 1 g by mouth 2 (two) times daily.     Fluticasone-Salmeterol 250-50 MCG/DOSE Aepb  Commonly known as:  ADVAIR DISKUS  Inhale 1 puff into the lungs 2 (two) times daily.     gabapentin 300 MG capsule  Commonly known as:  NEURONTIN  Take 3 capsules (900 mg total) by mouth 3 (three) times daily.     HYDROcodone-acetaminophen 5-325 MG per tablet  Commonly known as:  NORCO/VICODIN  Take 1 tablet by mouth every 6 (six)  hours as needed for moderate pain.     insulin aspart 100 UNIT/ML FlexPen  Commonly known as:  NOVOLOG  Inject 3-5 Units into the skin 3 (three) times daily before meals. Give 3 units before meals with an additional 5 units for CBG> or < 150     insulin glargine 100 UNIT/ML injection  Commonly known as:  LANTUS  Inject 22 Units into the skin at bedtime.     lamoTRIgine 25 MG tablet  Commonly known as:  LAMICTAL  Take 100 mg by mouth daily.     levalbuterol 1.25 MG/0.5ML nebulizer solution  Commonly known as:  XOPENEX  Take 1.25 mg by nebulization 4 (four) times daily as needed for wheezing or shortness of breath.     levalbuterol 45 MCG/ACT inhaler  Commonly known as:  XOPENEX HFA  Inhale 2 puffs into the lungs every 4 (four) hours as needed for wheezing.     loperamide 2 MG capsule  Commonly known as:  IMODIUM  Take 2 mg by mouth as needed for diarrhea or loose stools.     loratadine 10 MG tablet  Commonly known as:  CLARITIN  Take 10 mg by mouth daily.     LORazepam 2 MG/ML concentrated solution  Commonly known as:  ATIVAN  Take 1 mg by mouth every 12 (twelve) hours as needed for anxiety (and agitation.).     LORazepam 2 MG/ML injection  Commonly known as:  ATIVAN  Inject 68ml intramuscular every 12 hours for severe anxiety/agitation     magnesium hydroxide 400 MG/5ML suspension  Commonly known as:  MILK OF MAGNESIA  Take 30 mLs by mouth daily as needed for mild constipation.     metFORMIN 1000 MG tablet  Commonly known as:  GLUCOPHAGE  Take 1,000 mg by mouth 2 (two) times daily with a meal.     metoprolol tartrate 25 MG tablet  Commonly known as:  LOPRESSOR  Take 25 mg by mouth 2 (two) times daily. At 0800 and 1700.     naproxen sodium 220 MG tablet  Commonly known as:  ANAPROX  Take 220 mg by mouth 2 (two) times daily with a meal.     OxyCODONE 10 mg T12a 12 hr tablet  Commonly known as:  OXYCONTIN  Take 10 mg by mouth every 12 (twelve) hours. Take one  tablet by mouth every 12 hours. Do not crush     OXYGEN  Inhale 2 L into the lungs continuous. Via nasal cannula.     polyethylene glycol packet  Commonly known as:  MIRALAX / GLYCOLAX  Take 17 g by mouth daily.     polyvinyl alcohol 1.4 % ophthalmic solution  Commonly known as:  LIQUIFILM TEARS  Place 2 drops into both eyes 4 (four) times daily as needed for dry eyes.     potassium chloride 10 MEQ tablet  Commonly known as:  K-DUR,KLOR-CON  Take 5 mEq by mouth every evening.     promethazine 25 MG tablet  Commonly known as:  PHENERGAN  Take 25 mg by mouth every 6 (six) hours as needed. nausea     QUEtiapine 300 MG tablet  Commonly known as:  SEROQUEL  Take 100 mg by mouth at bedtime.     REFRESH LACRI-LUBE Oint  Place 1 application into both eyes at bedtime.     risperiDONE 2 MG tablet  Commonly known as:  RISPERDAL  Take 2 mg by mouth 2 (two) times daily.     simvastatin 10 MG tablet  Commonly known as:  ZOCOR  Take 10 mg by mouth daily at 6 PM.     sodium phosphate enema  Commonly known as:  FLEET  Place 1 enema rectally daily as needed (constipation). follow package directions     SYSTANE 0.4-0.3 % Soln  Generic drug:  Polyethyl Glycol-Propyl Glycol  Place 1 drop into both eyes 4 (four) times daily.     tiotropium 18 MCG inhalation capsule  Commonly known as:  SPIRIVA  Place 1 capsule (18 mcg total) into inhaler and inhale daily.     torsemide 20 MG tablet  Commonly known as:  DEMADEX  Take 60 mg by mouth daily.     traZODone 150 MG tablet  Commonly known as:  DESYREL  Take 150 mg by mouth at bedtime.     trimethoprim 100 MG tablet  Commonly known as:  TRIMPEX  Take 100 mg by mouth daily.     UTI-STAT Liqd  Take 30 mLs by mouth daily.     venlafaxine XR 150 MG 24 hr capsule  Commonly known as:  EFFEXOR-XR  Take 300 mg by mouth every morning.        No orders of the defined types were placed in this encounter.    Immunization History   Administered Date(s) Administered  . Influenza Split 10/20/2011  . Influenza Whole 10/05/2007, 09/16/2008, 11/17/2010, 10/01/2013  . Influenza,inj,Quad PF,36+ Mos 09/02/2014  . Pneumococcal Polysaccharide-23 10/21/2003, 11/17/2010, 01/27/2012  . Td 09/20/2003    History  Substance Use Topics  . Smoking status: Former Smoker -- 1.00 packs/day    Types: Cigarettes    Quit date: 11/14/1986  . Smokeless tobacco: Never Used     Comment: pt cannot recall the total amt of time she smoked.  . Alcohol Use: No    Review of Systems  DATA OBTAINED: from patient, nurse; to day c/o she wants to be on less lasix and she is worried about her K+ GENERAL:  no fevers, fatigue, appetite changes SKIN: No itching, rash HEENT: No complaint RESPIRATORY: No cough, wheezing, SOB CARDIAC: No chest pain, palpitations, lower extremity edema  GI: No abdominal pain, No N/V/D or constipation, No heartburn or reflux  GU: No dysuria, frequency or urgency, or incontinence  MUSCULOSKELETAL: No unrelieved bone/joint pain NEUROLOGIC: No headache, dizziness  PSYCHIATRIC: No overt anxiety or sadness  Filed Vitals:   09/28/14 1143  BP: 145/80  Pulse:  102  Temp: 98 F (36.7 C)  Resp: 22    Physical Exam  GENERAL APPEARANCE: Alert, conversant, No acute distress  SKIN: No diaphoresis rash, or wounds HEENT: Unremarkable RESPIRATORY: Breathing is even, unlabored. Lung sounds are clear   CARDIOVASCULAR: Heart RRR no murmurs, rubs or gallops. trace peripheral edema  GASTROINTESTINAL: Abdomen is soft, non-tender, not distended w/ normal bowel sounds.  GENITOURINARY: Bladder non tender, not distended  MUSCULOSKELETAL: No abnormal joints or musculature NEUROLOGIC: Cranial nerves 2-12 grossly intact. Moves all extremities PSYCHIATRIC: no change from usual  Patient Active Problem List   Diagnosis Date Noted  . Dysphagia, oropharyngeal 09/28/2014  . Neuropathy due to secondary diabetes 09/28/2014  . Esophageal  dysphagia 08/09/2014  . Non compliance with medical treatment 08/09/2014  . MRSA cellulitis 02/20/2014  . Tardive dyskinesia 02/10/2014  . Infection due to ESBL-producing Escherichia coli 02/02/2014  . Sepsis 01/21/2014  . Acute respiratory failure with hypoxia 01/21/2014  . Fever, unspecified 01/21/2014  . Septic shock 12/25/2013  . UTI 12/25/2013  . Acute encephalopathy 12/25/2013  . Pneumonia, organism unspecified 12/21/2013  . Cough 12/21/2013  . Diarrhea 12/21/2013  . Diaper candidiasis 12/17/2013  . Dysuria 09/17/2013  . Pure hyperglyceridemia 06/18/2013  . Type 2 diabetes mellitus with neurological manifestations, uncontrolled 05/04/2013  . Chronic pain syndrome 05/04/2013  . Unspecified constipation 04/04/2013  . Venous stasis dermatitis 04/03/2012  . COPD exacerbation 02/21/2012  . Tachycardia 12/27/2011  . Thrush 11/10/2011  . Ankle arthritis 07/28/2011  . Chronic right shoulder pain 06/03/2011  . Leg swelling 03/03/2011  . INSOMNIA, CHRONIC 11/17/2010  . Other chronic pain 11/17/2010  . GREENFIELD FILTER INSERTION, HX OF 11/17/2010  . NEOPLASM UNCERTAIN BEHAVIOR MAJOR SALIV GLANDS 11/10/2009  . PULMONARY EMBOLISM 10/02/2009  . DEEP VENOUS THROMBOPHLEBITIS, LEG, LEFT 10/02/2009  . Pulmonary nodules 09/12/2009  . Wheelchair dependence 04/10/2009  . COR PULMONALE 05/22/2008  . VENOUS INSUFFICIENCY, CHRONIC 03/25/2008  . SYMPTOM, INCONTINENCE, URINARY NOS 07/13/2007  . DIABETES MELLITUS, TYPE II 05/11/2007  . ANEMIA-NOS 05/11/2007  . GERD 05/11/2007  . HYPERLIPIDEMIA 02/16/2007  . Obesity, diabetes, and hypertension syndrome 02/16/2007  . BIPOLAR DISORDER 02/16/2007  . ANXIETY 02/16/2007  . HYPERTENSION, BENIGN SYSTEMIC 02/16/2007  . COPD (chronic obstructive pulmonary disease) with emphysema 02/16/2007  . OSTEOPENIA 02/16/2007  . TOBACCO ABUSE, HX OF 02/16/2007    CBC    Component Value Date/Time   WBC 8.4 09/12/2014 1558   RBC 4.27 09/12/2014 1558   HGB  12.1 09/12/2014 1558   HCT 38.5 09/12/2014 1558   PLT 226 09/12/2014 1558   MCV 90.2 09/12/2014 1558   LYMPHSABS 2.9 02/05/2014 0347   MONOABS 0.6 02/05/2014 0347   EOSABS 0.2 02/05/2014 0347   BASOSABS 0.1 02/05/2014 0347    CMP     Component Value Date/Time   NA 136* 09/12/2014 1558   K 4.6 09/12/2014 1558   CL 95* 09/12/2014 1558   CO2 26 09/12/2014 1558   GLUCOSE 207* 09/12/2014 1558   BUN 9 09/12/2014 1558   CREATININE 0.59 09/12/2014 1558   CREATININE 0.73 03/24/2011 1614   CALCIUM 9.4 09/12/2014 1558   PROT 7.1 01/21/2014 1139   ALBUMIN 3.2* 01/21/2014 1139   AST 30 01/21/2014 1139   ALT 14 01/21/2014 1139   ALKPHOS 57 01/21/2014 1139   BILITOT 0.2* 01/21/2014 1139   GFRNONAA 89* 09/12/2014 1558   GFRAA >90 09/12/2014 1558    Assessment and Plan  Esophageal dysphagia Pt was seen by Dr Dellis Filbert Medoff 09/09/2014,  presumably for vomiting, which was reported to be increased since pt was on thickened liquids. Pt has been non compliant with thickened liquids so unlikely cause of any vomiting. The vomiting described to Dr Earlean Shawl is at odds with the reality at [facility]. In any case what Ms Machi wanted was for someone to give her permission to drink thin liquids since I wouldn't based on her study and Dr Earlean Shawl did that. I signed off as long as Dr Earlean Shawl was taking responsibility and the pt and family know the risk of aspiration , which they do. However, once Ms Knipp has an aspiration PNA it is highly unlikely that she or her family will take responsibility for their actions. The pt has a long hx of non compliance in any case.  Dysphagia, oropharyngeal Pt was seen by Dr Richmond Campbell 09/09/2014, presumably for vomiting, which was reported to be increased since pt was on thickened liquids. Pt has been non compliant with thickened liquids so unlikely cause of any vomiting. The vomiting described to Dr Earlean Shawl is at odds with the reality at [facility]. In any case what Ms Trick wanted was for someone to give her permission  to drink thin liquids since I wouldn't based on her study and Dr Earlean Shawl did that. I signed off as long as Dr Earlean Shawl was taking responsibility and the pt and family know the risk of aspiration , which they do. However, once Ms Argueta has an aspiration PNA it is highly unlikely that she or her family will take responsibility for their actions. The pt has a long hx of non compliance in any case  Type 2 diabetes mellitus with neurological manifestations, uncontrolled Dr Earlean Shawl rec d/c metformin 2/2 reported vomiting. Pt has not been vomiting but she is already on lantus so it can be increased. D/C metformin and lantus 20u  qam and 22u qpm. Pt is non compliant so doubtful that pt will be well controlled no matter the regimen but will continue to work with her.   BIPOLAR DISORDER Pt is on scheduled xanax, effexor, lamictal, just increased form 50 mg to 100mg , seroquel, just decreased from 150 to 100 mg, risperdol and trazodone and is barely controlled, often not, - not for lack of effort on the part of psych- so she has Ativan gel prn as well.   Neuropathy due to secondary diabetes Continue neurontin 900 mg TID  Leg swelling Waxes and wanes with her demands for more or less diuretic but can be controlled when pt allows  Obesity, diabetes, and hypertension syndrome BMI 34.8; some weight loss, which is desirable and not due to vomiting; DM is poorly controlled due to pt compliance and HTN is fairly controlled.  GERD Pt on nexium and multiple constipation/diarrhea prns    Hennie Duos, MD

## 2014-09-28 NOTE — Assessment & Plan Note (Signed)
Pt was seen by Dr [name] 09/09/2014, presumably for vomiting, which was reported to be increased since pt was on thickened liquids. Pt has been non compliant with thickened liquids so unlikely cause of any vomiting. The vomiting described to Dr [name] is at odds with the reality at [facility]. In any case what Ms [name] wanted was for someone to give her permission to drink thin liquids since I wouldn't based on her study and Dr [name] did that. I signed off as long as Dr [name] was taking responsibility and the pt and family know the risk of aspiration , which they do. However, once Ms [name] has an aspiration PNA it is highly unlikely that she or her family will take responsibility for their actions. The pt has a long hx of non compliance in any case.

## 2014-09-28 NOTE — Assessment & Plan Note (Signed)
Dr Earlean Shawl rec d/c metformin 2/2 reported vomiting. Pt has not been vomiting but she is already on lantus so it can be increased. D/C metformin and lantus 20u  qam and 22u qpm. Pt is non compliant so doubtful that pt will be well controlled no matter the regimen but will continue to work with her.

## 2014-09-28 NOTE — Assessment & Plan Note (Signed)
BMI 34.8; some weight loss, which is desirable and not due to vomiting; DM is poorly controlled due to pt compliance and HTN is fairly controlled.

## 2014-09-28 NOTE — Assessment & Plan Note (Signed)
Continue neurontin 900 mg TID

## 2014-09-30 ENCOUNTER — Ambulatory Visit (INDEPENDENT_AMBULATORY_CARE_PROVIDER_SITE_OTHER)
Admission: RE | Admit: 2014-09-30 | Discharge: 2014-09-30 | Disposition: A | Discharge status: Home / Self Care | Payer: Medicare Other | Source: Ambulatory Visit | Attending: Pulmonary Disease | Admitting: Pulmonary Disease

## 2014-09-30 DIAGNOSIS — R918 Other nonspecific abnormal finding of lung field: Secondary | ICD-10-CM

## 2014-10-08 ENCOUNTER — Non-Acute Institutional Stay (SKILLED_NURSING_FACILITY): Payer: Medicare Other | Admitting: Internal Medicine

## 2014-10-08 DIAGNOSIS — J029 Acute pharyngitis, unspecified: Secondary | ICD-10-CM | POA: Insufficient documentation

## 2014-10-08 DIAGNOSIS — R0989 Other specified symptoms and signs involving the circulatory and respiratory systems: Secondary | ICD-10-CM | POA: Insufficient documentation

## 2014-10-08 NOTE — Progress Notes (Signed)
Patient ID: [name], female   DOB: 01-14-1941, 73 y.o.   MRN: [id number]   this is an acute visit.  Level care skilled.  [place].   Chief Complaint   Patient presents with   .   raspy voice-question chest congestion   HPI: Patient is 73 y.o. female who per nursing has developed a raspy voice the last day or 2-vital signs are stable she at times will complain of chest congestion-she does have some history of pneumonia in the past and is at risk for aspiration .  Past Medical History   Diagnosis  Date   .  Anxiety    .  Arthritis    .  COPD (chronic obstructive pulmonary disease)    .  GERD (gastroesophageal reflux disease)    .  Hyperlipidemia    .  Osteoporosis    .  DVT (deep venous thrombosis)    .  PE (pulmonary thromboembolism)    .  Hypertension      PT DENIES....ON NO MEDS   .  Diabetes mellitus    .  Cancer  1969     cervical   .  Pneumonia      h/o   .  Bronchitis      h/o   .  Blood transfusion    .  Anemia    .  Bipolar affect, depressed     Past Surgical History   Procedure  Laterality  Date   .  Vena cava filter placement     .  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  Eye surgery       CATARACTS   .  Achilles tendon lengthening   11/18/11     and repair w/posterior tibial tendon lengthening; right foot   .  Cholecystectomy     .  Abdominal hysterectomy   1969     "womb taken out for cervical cancer"   .  Cataract extraction, bilateral   ~ 2008   .  Tonsillectomy       "as a child"   .  Achilles tendon surgery   11/18/2011     Procedure: ACHILLES TENDON REPAIR; Surgeon: Wylene Simmer, MD; Location: [facility]; Service: Orthopedics; Laterality: Right; Right Posterior Tibial Tendon Lenghtening and Tendon Achilles Lenghtening      Medication List         .            acetaminophen 325 MG tablet    Commonly known as: TYLENOL    Take 650 mg by mouth every 4 (four) hours as needed for mild pain.    ALPRAZolam 1 MG tablet    Commonly known as: XANAX    Take 1 mg by mouth 2 (two) times daily. Take at 0600, 1000, 1400, and 1800.    alum & mag hydroxide-simeth 200-200-20 MG/5ML suspension    Commonly known as: MAALOX/MYLANTA    Take 30 mLs by mouth every 6 (six) hours as needed for indigestion or heartburn.    antiseptic oral rinse Liqd    15 mLs by Mouth Rinse route every 4 (four) hours as needed for dry mouth.    aspirin 81 MG chewable tablet    Chew 81 mg by mouth daily.    bisacodyl 10 MG suppository    Commonly known as: DULCOLAX    Place 10 mg rectally as directed. On Monday, Wednesday, and Friday and  daily as needed if no bowel movement in 2 days.    CHLORASEPTIC 6-10 MG lozenge    Generic drug: benzocaine-menthol    Take 1 lozenge by mouth every 2 (two) hours as needed for sore throat.    esomeprazole 40 MG capsule    Commonly known as: NEXIUM    Take 40 mg by mouth daily at 12 noon.    fenofibrate 145 MG tablet    Commonly known as: TRICOR    Take 145 mg by mouth daily.    fish oil-omega-3 fatty acids 1000 MG capsule    Take 1 g by mouth 2 (two) times daily.    Fluticasone-Salmeterol 250-50 MCG/DOSE Aepb    Commonly known as: ADVAIR DISKUS    Inhale 1 puff into the lungs 2 (two) times daily.    gabapentin 300 MG capsule    Commonly known as: NEURONTIN    Take 3 capsules (900 mg total) by mouth 3 (three) times daily.    HYDROcodone-acetaminophen 5-325 MG per tablet    Commonly known as: NORCO/VICODIN    Take 1 tablet by mouth every 6 (six) hours as needed for moderate pain.    insulin aspart 100 UNIT/ML FlexPen    Commonly known as: NOVOLOG    Inject 3-5 Units into the skin 3 (three) times daily before meals. Give 3 units before meals with an additional 5 units for CBG> or < 150    insulin glargine 100 UNIT/ML injection    Commonly known as: LANTUS    Inject 22 Units into the skin at bedtime.    lamoTRIgine 25 MG tablet    Commonly known as: LAMICTAL    Take 100 mg by mouth daily.     levalbuterol 1.25 MG/0.5ML nebulizer solution    Commonly known as: XOPENEX    Take 1.25 mg by nebulization 4 (four) times daily as needed for wheezing or shortness of breath.    levalbuterol 45 MCG/ACT inhaler    Commonly known as: XOPENEX HFA    Inhale 2 puffs into the lungs every 4 (four) hours as needed for wheezing.    loperamide 2 MG capsule    Commonly known as: IMODIUM    Take 2 mg by mouth as needed for diarrhea or loose stools.    loratadine 10 MG tablet    Commonly known as: CLARITIN    Take 10 mg by mouth daily.    LORazepam 2 MG/ML concentrated solution    Commonly known as: ATIVAN    Take 1 mg by mouth every 12 (twelve) hours as needed for anxiety (and agitation.).    LORazepam 2 MG/ML injection    Commonly known as: ATIVAN    Inject 20ml intramuscular every 12 hours for severe anxiety/agitation    magnesium hydroxide 400 MG/5ML suspension    Commonly known as: MILK OF MAGNESIA    Take 30 mLs by mouth daily as needed for mild constipation.    metFORMIN 1000 MG tablet    Commonly known as: GLUCOPHAGE    Take 1,000 mg by mouth 2 (two) times daily with a meal.    metoprolol tartrate 25 MG tablet    Commonly known as: LOPRESSOR    Take 25 mg by mouth 2 (two) times daily. At 0800 and 1700.    naproxen sodium 220 MG tablet    Commonly known as: ANAPROX    Take 220 mg by mouth 2 (two) times daily with a meal.    OxyCODONE 10 mg  T12a 12 hr tablet    Commonly known as: OXYCONTIN    Take 10 mg by mouth every 12 (twelve) hours. Take one tablet by mouth every 12 hours. Do not crush    OXYGEN    Inhale 2 L into the lungs continuous. Via nasal cannula.    polyethylene glycol packet    Commonly known as: MIRALAX / GLYCOLAX    Take 17 g by mouth daily.    polyvinyl alcohol 1.4 % ophthalmic solution    Commonly known as: LIQUIFILM TEARS    Place 2 drops into both eyes 4 (four) times daily as needed for dry eyes.    potassium chloride 10 MEQ tablet    Commonly known as:  K-DUR,KLOR-CON    Take 5 mEq by mouth every evening.    promethazine 25 MG tablet    Commonly known as: PHENERGAN    Take 25 mg by mouth every 6 (six) hours as needed. nausea    QUEtiapine 300 MG tablet    Commonly known as: SEROQUEL    Take 100 mg by mouth at bedtime.    REFRESH LACRI-LUBE Oint    Place 1 application into both eyes at bedtime.    risperiDONE 2 MG tablet    Commonly known as: RISPERDAL    Take 2 mg by mouth 2 (two) times daily.    simvastatin 10 MG tablet    Commonly known as: ZOCOR    Take 10 mg by mouth daily at 6 PM.    sodium phosphate enema    Commonly known as: FLEET    Place 1 enema rectally daily as needed (constipation). follow package directions    SYSTANE 0.4-0.3 % Soln    Generic drug: Polyethyl Glycol-Propyl Glycol    Place 1 drop into both eyes 4 (four) times daily.    tiotropium 18 MCG inhalation capsule    Commonly known as: SPIRIVA    Place 1 capsule (18 mcg total) into inhaler and inhale daily.    torsemide 20 MG tablet    Commonly known as: DEMADEX    Take 60 mg by mouth daily.    traZODone 150 MG tablet    Commonly known as: DESYREL    Take 150 mg by mouth at bedtime.    trimethoprim 100 MG tablet    Commonly known as: TRIMPEX    Take 100 mg by mouth daily.    UTI-STAT Liqd    Take 30 mLs by mouth daily.    venlafaxine XR 150 MG 24 hr capsule    Commonly known as: EFFEXOR-XR    Take 300 mg by mouth every morning.     No orders of the defined types were placed in this encounter.  Immunization History   Administered  Date(s) Administered   .  Influenza Split  10/20/2011   .  Influenza Whole  10/05/2007, 09/16/2008, 11/17/2010, 10/01/2013   .  Influenza,inj,Quad PF,36+ Mos  09/02/2014   .  Pneumococcal Polysaccharide-23  10/21/2003, 11/17/2010, 01/27/2012   .  Td  09/20/2003    History   Substance Use Topics   .  Smoking status:  Former Smoker -- 1.00 packs/day     Types:  Cigarettes     Quit date:  11/14/1986   .  Smokeless  tobacco:  Never Used      Comment: pt cannot recall the total amt of time she smoked.   .  Alcohol Use:  No   Review of Systems  DATA OBTAINED:  from patient, nurse;   GENERAL: no fevers, fatigue, appetite changes  SKIN: No itching, rash  HEENT: notes a  raspy occasionally sore throat RESPIRATORY: No cough, wheezing, SOB --feels at times she had some chest congestion CARDIAC: No chest pain, palpitations, lower extremity edema  GI: No abdominal pain, No N/V/D or constipation, No heartburn or reflux  GU: No dysuria, frequency or urgency, or incontinence  MUSCULOSKELETAL: No unrelieved bone/joint pain  NEUROLOGIC: No headache, dizziness  PSYCHIATRIC: Some history of anxiety                    Physical Exam Temperature 97.0 pulse 100 respirations 20 blood pressure 157 over a 1 O2 saturation is 98% on oxygen  GENERAL APPEARANCE: Alert, conversant, No acute distress sitting comfortably in her wheelchair somewhat anxious SKIN: No diaphoresis rash, or wounds  HEENT: Oropharynx is clear I do not really note significant increased erythema there is no exudate noted  RESPIRATORY: Breathing is even, unlabored. Lung sounds are clear--with shallow entry-again patient does complain at times of congestion  CARDIOVASCULAR: Heart RRR--borderline tachycardic- no murmurs, rubs or gallops. trace peripheral edema  GASTROINTESTINAL: Abdomen is soft, non-tender, not distended w/ normal bowel sounds.  MUSCULOSKELETAL: No abnormal joints or musculature  NEUROLOGIC: Cranial nerves 2-12 grossly intact. Moves all extremities  PSYCHIATRIC: no change from usual  Patient Active Problem List    Diagnosis  Date Noted   .  Dysphagia, oropharyngeal  09/28/2014   .  Neuropathy due to secondary diabetes  09/28/2014   .  Esophageal dysphagia  08/09/2014   .  Non compliance with medical treatment  08/09/2014   .  MRSA cellulitis  02/20/2014   .  Tardive dyskinesia  02/10/2014   .  Infection due to ESBL-producing  Escherichia coli  02/02/2014   .  Sepsis  01/21/2014   .  Acute respiratory failure with hypoxia  01/21/2014   .  Fever, unspecified  01/21/2014   .  Septic shock  12/25/2013   .  UTI  12/25/2013   .  Acute encephalopathy  12/25/2013   .  Pneumonia, organism unspecified  12/21/2013   .  Cough  12/21/2013   .  Diarrhea  12/21/2013   .  Diaper candidiasis  12/17/2013   .  Dysuria  09/17/2013   .  Pure hyperglyceridemia  06/18/2013   .  Type 2 diabetes mellitus with neurological manifestations, uncontrolled  05/04/2013   .  Chronic pain syndrome  05/04/2013   .  Unspecified constipation  04/04/2013   .  Venous stasis dermatitis  04/03/2012   .  COPD exacerbation  02/21/2012   .  Tachycardia  12/27/2011   .  Thrush  11/10/2011   .  Ankle arthritis  07/28/2011   .  Chronic right shoulder pain  06/03/2011   .  Leg swelling  03/03/2011   .  INSOMNIA, CHRONIC  11/17/2010   .  Other chronic pain  11/17/2010   .  [place]EN[place] FILTER INSERTION, HX OF  11/17/2010   .  NEOPLASM UNCERTAIN BEHAVIOR MAJOR SALIV GLANDS  11/10/2009   .  PULMONARY EMBOLISM  10/02/2009   .  DEEP VENOUS THROMBOPHLEBITIS, LEG, LEFT  10/02/2009   .  Pulmonary nodules  09/12/2009   .  Wheelchair dependence  04/10/2009   .  COR PULMONALE  05/22/2008   .  VENOUS INSUFFICIENCY, CHRONIC  03/25/2008   .  SYMPTOM, INCONTINENCE, URINARY NOS  07/13/2007   .  DIABETES MELLITUS,  TYPE II  05/11/2007   .  ANEMIA-NOS  05/11/2007   .  GERD  05/11/2007   .  HYPERLIPIDEMIA  02/16/2007   .  Obesity, diabetes, and hypertension syndrome  02/16/2007   .  BIPOLAR DISORDER  02/16/2007   .  ANXIETY  02/16/2007   .  HYPERTENSION, BENIGN SYSTEMIC  02/16/2007   .  COPD (chronic obstructive pulmonary disease) with emphysema  02/16/2007   .  OSTEOPENIA  02/16/2007   .  TOBACCO ABUSE, HX OF  02/16/2007    Labs.  10/01/2014.  Sodium 133 potassium 3.9 BUN 13 creatinine 0.5.     Component  Value  Date/Time    WBC  8.4  09/12/2014 1558      RBC  4.27  09/12/2014 1558    HGB  12.1  09/12/2014 1558    HCT  38.5  09/12/2014 1558    PLT  226  09/12/2014 1558    MCV  90.2  09/12/2014 1558    LYMPHSABS  2.9  02/05/2014 0347    MONOABS  0.6  02/05/2014 0347    EOSABS  0.2  02/05/2014 0347    BASOSABS  0.1  02/05/2014 0347   CMP    Component  Value  Date/Time    NA  136*  09/12/2014 1558    K  4.6  09/12/2014 1558    CL  95*  09/12/2014 1558    CO2  26  09/12/2014 1558    GLUCOSE  207*  09/12/2014 1558    BUN  9  09/12/2014 1558    CREATININE  0.59  09/12/2014 1558    CREATININE  0.73  03/24/2011 1614    CALCIUM  9.4  09/12/2014 1558    PROT  7.1  01/21/2014 1139    ALBUMIN  3.2*  01/21/2014 1139    AST  30  01/21/2014 1139    ALT  14  01/21/2014 1139    ALKPHOS  57  01/21/2014 1139    BILITOT  0.2*  01/21/2014 1139    GFRNONAA  89*  09/12/2014 1558    GFRAA  >90  09/12/2014 1558   Assessment and Plan   Pharyngitis-at this point physical exam was fairly benign but she does complain occasionally of sore throat she has Chloraseptic lozenges when necessary-Will write an order for Chloraseptic spray to see if this may give her a little more relief and continue to monitor for any changes here.  Question chest congestion-patient continues at risk for aspiration on thickened liquids--with a history of esophageal dysmotility-this has been noted previously by Dr. Gwynneth Aliment is complaining of chest congestion with a significant pulmonary history will check a chest x-ray to rule out a pneumonia etiology--she continues on Xopenex inhaler when necessary as well as nebulizers-4 times a day when necessary  .  Monitor vital signs pulse ox every shift for 72 hours as well.  [id number]--

## 2014-10-11 ENCOUNTER — Other Ambulatory Visit: Payer: Self-pay

## 2014-10-11 MED ORDER — OXYCODONE HCL ER 10 MG PO T12A: EXTENDED_RELEASE_TABLET | ORAL | Status: DC

## 2014-10-11 NOTE — Telephone Encounter (Signed)
RX sent to [facility] @ [phone]. Phone number [phone]  

## 2014-10-15 IMAGING — CR DG CHEST 2V
2 series · 2 of 2 positions shown · non-contrast
Comparison: July 27, 2014

CLINICAL DATA: Difficulty breathing

EXAM:
CHEST  2 VIEW

[w chest lat]
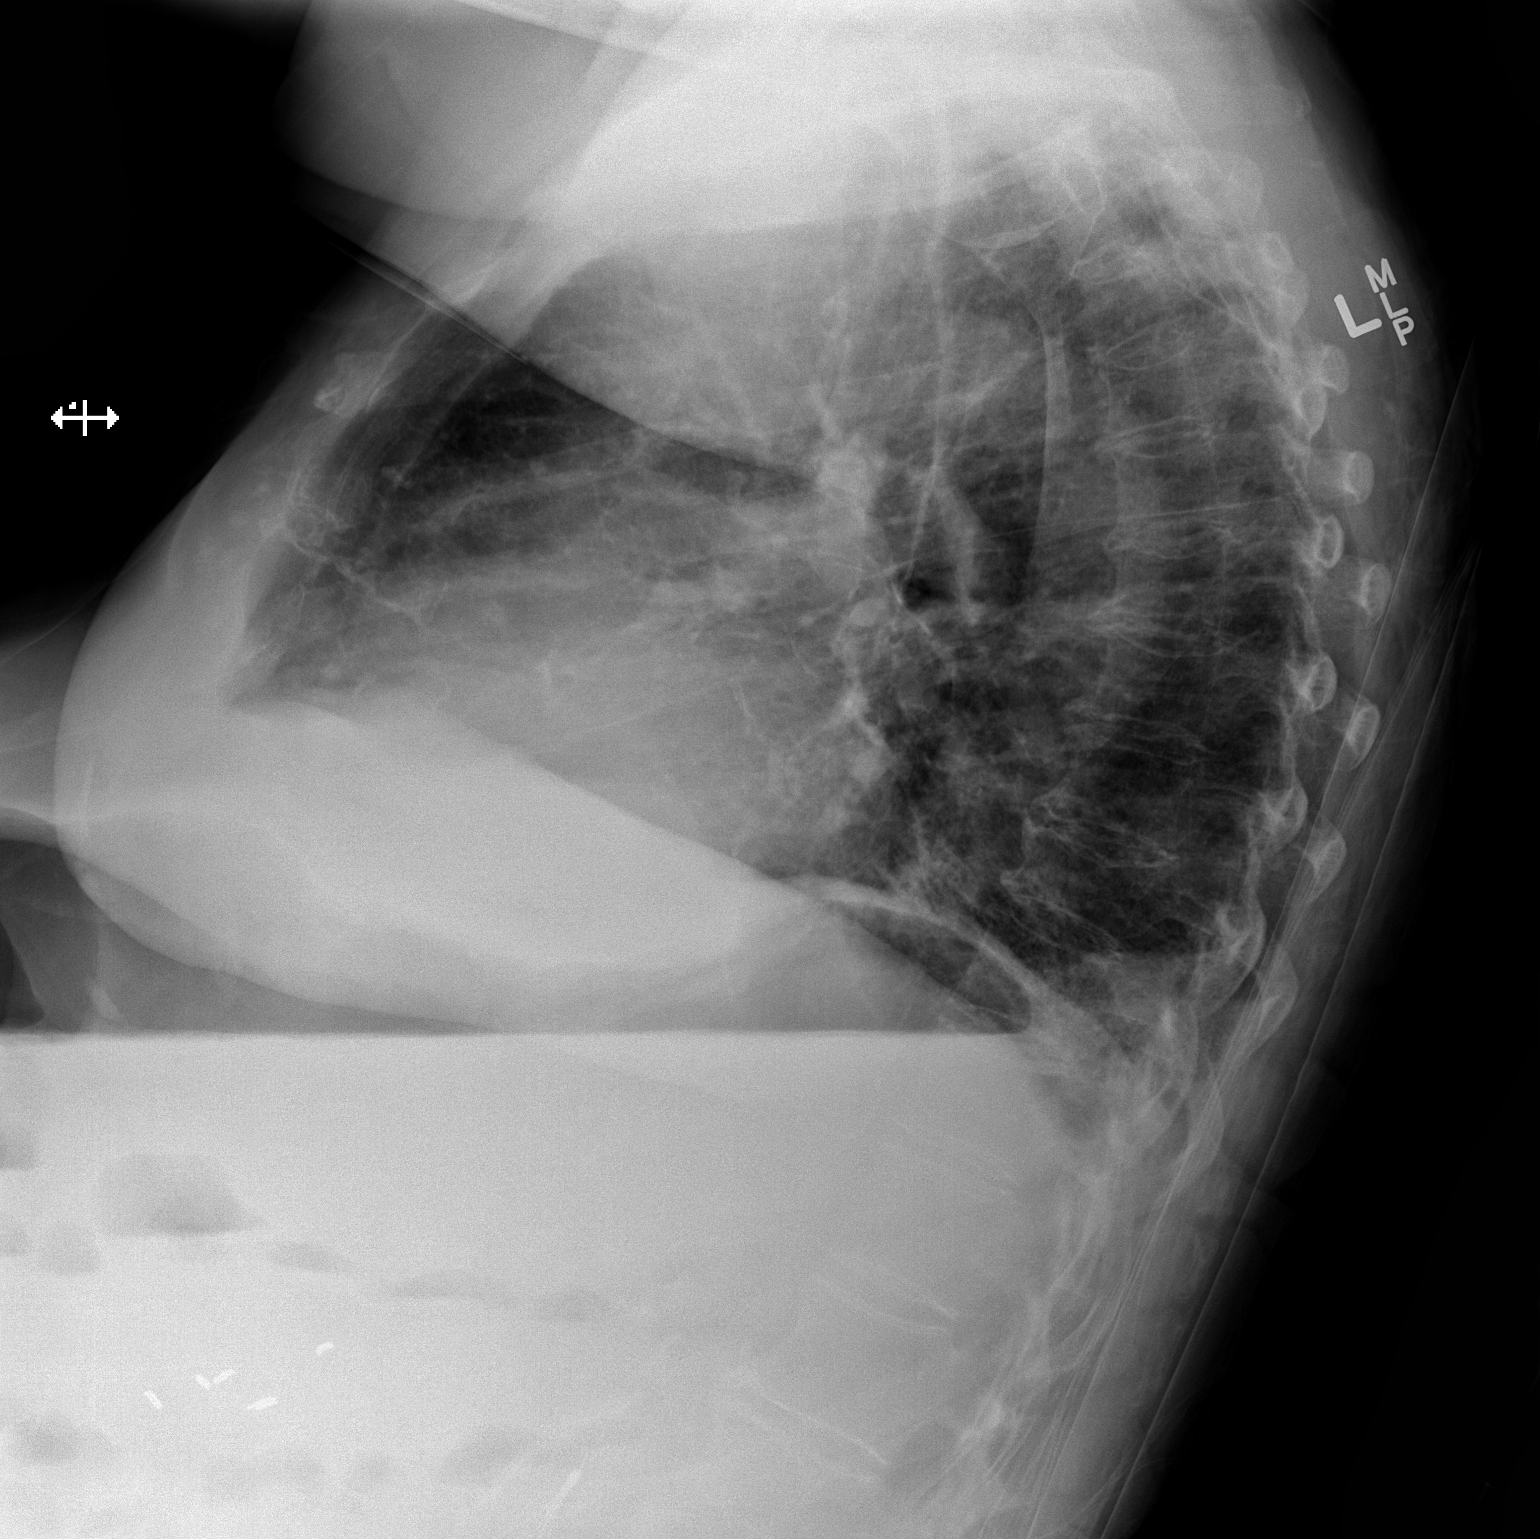

[x chest ap]
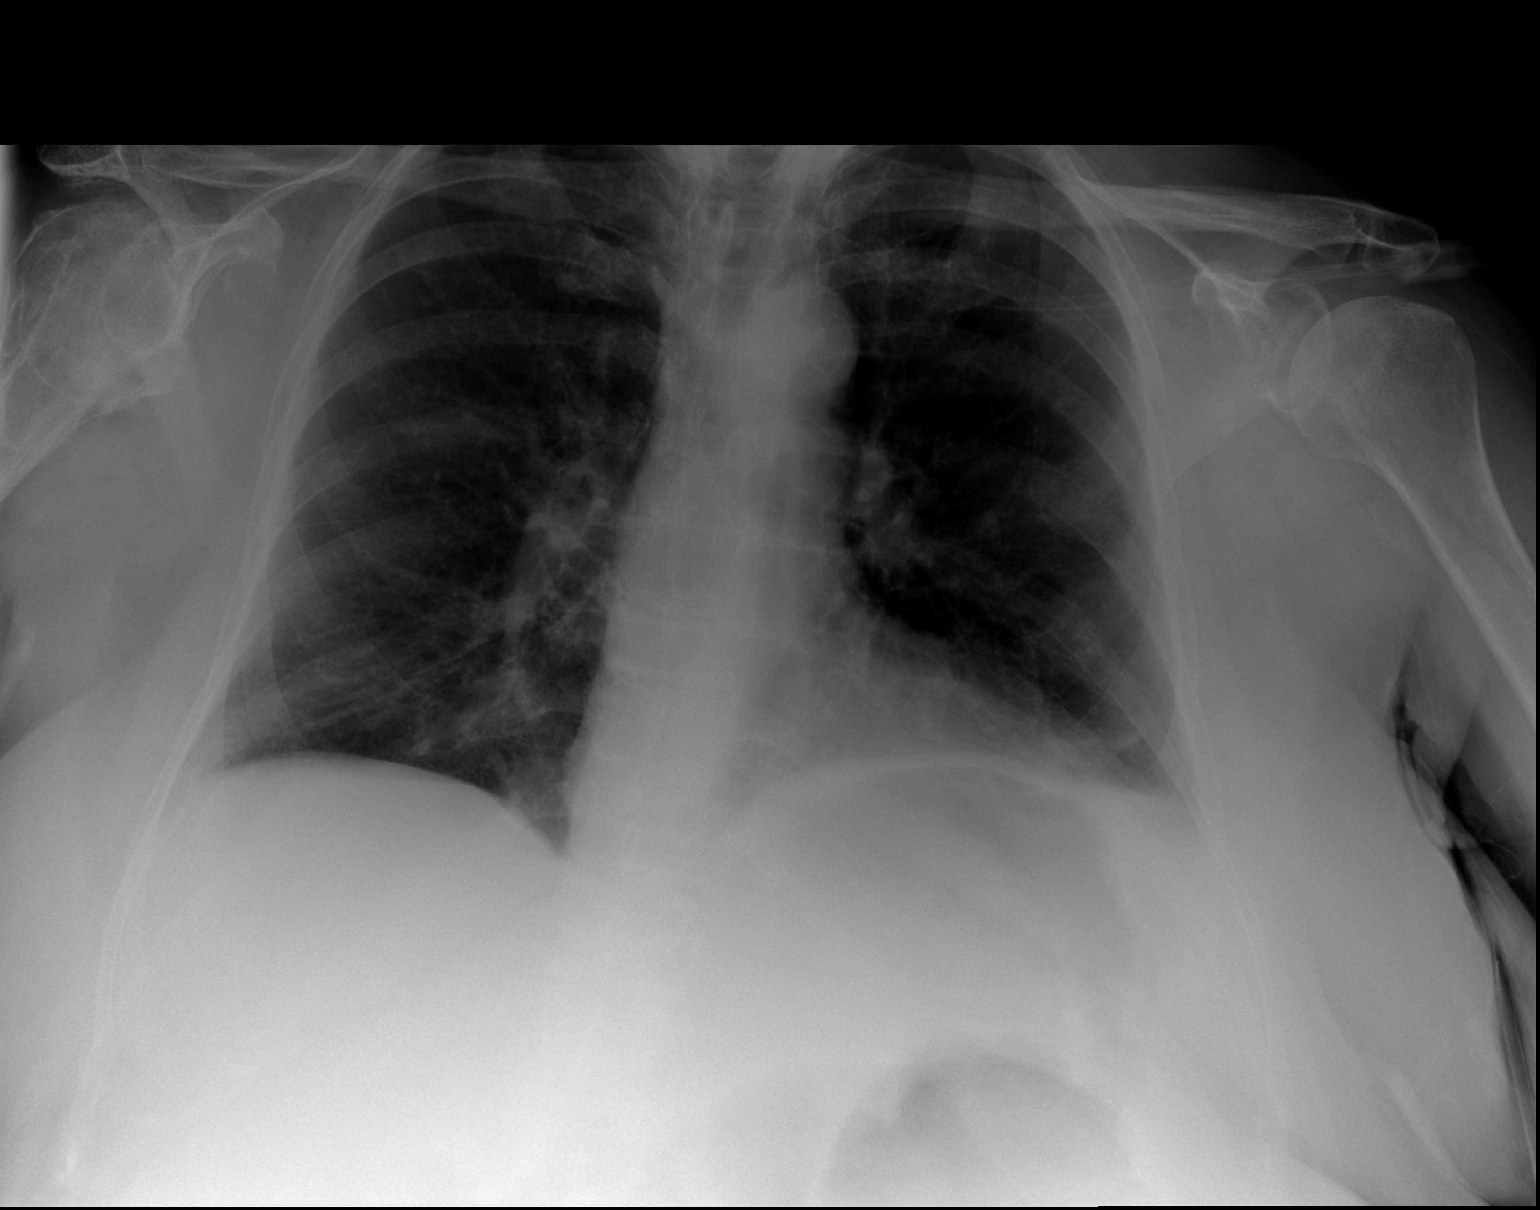

[2 of 2 positions shown; findings below may reference images not displayed]

FINDINGS: There is scarring in the left base region. Elsewhere lungs are
clear. Heart size and pulmonary vascularity are normal. No
adenopathy. There is evidence of old trauma in the proximal right
humerus with remodeling, stable.
IMPRESSION: Scarring left base.  No edema or consolidation.

## 2014-10-17 ENCOUNTER — Encounter: Payer: Self-pay | Admitting: Internal Medicine

## 2014-10-17 ENCOUNTER — Non-Acute Institutional Stay (SKILLED_NURSING_FACILITY): Payer: Medicare Other | Admitting: Internal Medicine

## 2014-10-17 DIAGNOSIS — R5383 Other fatigue: Secondary | ICD-10-CM

## 2014-10-17 NOTE — Progress Notes (Signed)
Patient ID: [name], female   DOB: Nov 09, 1941, 73 y.o.   MRN: [id number]   this is an acute visit.  Level care skilled.  [place].  Chief Complaint   Patient presents with   .   lethargy    HPI: Patient is 73 y.o. female who per nursing has been had her baseline recently-however earlier this evening nursing staff should she had increased lethargy and was difficult to arouse-shortly after I entered the room and with sternal rub patient did wake up-her vital signs remained stable-and within a matter of minutes she was at her baseline quite animated and talkative  She did not complain of any chest pain shortness of breath headache dizziness or increased weakness-stated simply that she had been sleeping very soundly.  Neurologically she appears to be at her baseline-and actually was asking for her medicines and food within a matter of about 10 minutes  She does have a history of psychosis is on numerous medications Lamictal was recently increased about a week ago-she is also on Xanax 4 times a day routinely as well as Neurontin--she also is on Seroquel at night as well as Risperdal twice a day-she has tolerated this quite well--and with her fragile status in regards to her psychiatric issues and psychiatric services has been monitoring this quite carefully   .  Past Medical History   Diagnosis  Date   .  Anxiety    .  Arthritis    .  COPD (chronic obstructive pulmonary disease)    .  GERD (gastroesophageal reflux disease)    .  Hyperlipidemia    .  Osteoporosis    .  DVT (deep venous thrombosis)    .  PE (pulmonary thromboembolism)    .  Hypertension      PT DENIES....ON NO MEDS   .  Diabetes mellitus    .  Cancer  1969     cervical   .  Pneumonia      h/o   .  Bronchitis      h/o   .  Blood transfusion    .  Anemia    .  Bipolar affect, depressed     Past Surgical History   Procedure  Laterality  Date   .  Vena cava filter placement     .  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  Eye surgery       CATARACTS   .  Achilles tendon lengthening   11/18/11     and repair w/posterior tibial tendon lengthening; right foot   .  Cholecystectomy     .  Abdominal hysterectomy   1969     "womb taken out for cervical cancer"   .  Cataract extraction, bilateral   ~ 2008   .  Tonsillectomy       "as a child"   .  Achilles tendon surgery   11/18/2011     Procedure: ACHILLES TENDON REPAIR; Surgeon: Wylene Simmer, MD; Location: [place]; Service: Orthopedics; Laterality: Right; Right Posterior Tibial Tendon Lenghtening and Tendon Achilles Lenghtening      Medication List         .            acetaminophen 325 MG tablet    Commonly known as: TYLENOL    Take 650 mg by mouth every 4 (four) hours as needed for mild pain.  ALPRAZolam 1 MG tablet    Commonly known as: XANAX    Take 1 mg by mouth 2 (two) times daily. Take at 0600, 1000, 1400, and 1800.    alum & mag hydroxide-simeth 200-200-20 MG/5ML suspension    Commonly known as: MAALOX/MYLANTA    Take 30 mLs by mouth every 6 (six) hours as needed for indigestion or heartburn.    antiseptic oral rinse Liqd    15 mLs by Mouth Rinse route every 4 (four) hours as needed for dry mouth.    aspirin 81 MG chewable tablet    Chew 81 mg by mouth daily.    bisacodyl 10 MG suppository    Commonly known as: DULCOLAX    Place 10 mg rectally as directed. On Monday, Wednesday, and Friday and daily as needed if no bowel movement in 2 days.    CHLORASEPTIC 6-10 MG lozenge    Generic drug: benzocaine-menthol    Take 1 lozenge by mouth every 2 (two) hours as needed for sore throat.    esomeprazole 40 MG capsule    Commonly known as: NEXIUM    Take 40 mg by mouth daily at 12 noon.    fenofibrate 145 MG tablet    Commonly known as: TRICOR    Take 145 mg by mouth daily.    fish oil-omega-3 fatty acids 1000 MG capsule    Take 1 g by mouth 2 (two) times daily.    Fluticasone-Salmeterol 250-50 MCG/DOSE Aepb     Commonly known as: ADVAIR DISKUS    Inhale 1 puff into the lungs 2 (two) times daily.    gabapentin 300 MG capsule    Commonly known as: NEURONTIN    Take 3 capsules (900 mg total) by mouth 3 (three) times daily.    HYDROcodone-acetaminophen 5-325 MG per tablet    Commonly known as: NORCO/VICODIN    Take 1 tablet by mouth every 6 (six) hours as needed for moderate pain.    insulin aspart 100 UNIT/ML FlexPen    Commonly known as: NOVOLOG    Inject 3-5 Units into the skin 3 (three) times daily before meals. Give 3 units before meals with an additional 5 units for CBG> or < 150    insulin glargine 100 UNIT/ML injection    Commonly known as: LANTUS    Inject 22 Units into the skin at bedtime.    lamoTRIgine 25 MG tablet    Commonly known as: LAMICTAL    Take 150 mg by mouth daily.    levalbuterol 1.25 MG/0.5ML nebulizer solution    Commonly known as: XOPENEX    Take 1.25 mg by nebulization 4 (four) times daily as needed for wheezing or shortness of breath.    levalbuterol 45 MCG/ACT inhaler    Commonly known as: XOPENEX HFA    Inhale 2 puffs into the lungs every 4 (four) hours as needed for wheezing.    loperamide 2 MG capsule    Commonly known as: IMODIUM    Take 2 mg by mouth as needed for diarrhea or loose stools.    loratadine 10 MG tablet    Commonly known as: CLARITIN    Take 10 mg by mouth daily.    LORazepam 2 MG/ML concentrated solution    Commonly known as: ATIVAN    Take 1 mg by mouth every 12 (twelve) hours as needed for anxiety (and agitation.).    LORazepam 2 MG/ML injection    Commonly known as: ATIVAN  Inject 54ml intramuscular every 12 hours for severe anxiety/agitation    magnesium hydroxide 400 MG/5ML suspension    Commonly known as: MILK OF MAGNESIA    Take 30 mLs by mouth daily as needed for mild constipation.    metFORMIN 1000 MG tablet    Commonly known as: GLUCOPHAGE    Take 1,000 mg by mouth 2 (two) times daily with a meal.    metoprolol tartrate 25 MG  tablet    Commonly known as: LOPRESSOR    Take 25 mg by mouth 2 (two) times daily. At 0800 and 1700.    naproxen sodium 220 MG tablet    Commonly known as: ANAPROX    Take 220 mg by mouth 2 (two) times daily with a meal.    OxyCODONE 10 mg T12a 12 hr tablet    Commonly known as: OXYCONTIN    Take 10 mg by mouth every 12 (twelve) hours. Take one tablet by mouth every 12 hours. Do not crush    OXYGEN    Inhale 2 L into the lungs continuous. Via nasal cannula.    polyethylene glycol packet    Commonly known as: MIRALAX / GLYCOLAX    Take 17 g by mouth daily.    polyvinyl alcohol 1.4 % ophthalmic solution    Commonly known as: LIQUIFILM TEARS    Place 2 drops into both eyes 4 (four) times daily as needed for dry eyes.    potassium chloride 10 MEQ tablet    Commonly known as: K-DUR,KLOR-CON    Take 5 mEq by mouth every evening.    promethazine 25 MG tablet    Commonly known as: PHENERGAN    Take 25 mg by mouth every 6 (six) hours as needed. nausea    QUEtiapine 300 MG tablet    Commonly known as: SEROQUEL    Take 100 mg by mouth at bedtime.    REFRESH LACRI-LUBE Oint    Place 1 application into both eyes at bedtime.    risperiDONE 2 MG tablet    Commonly known as: RISPERDAL    Take 2 mg by mouth 2 (two) times daily.    simvastatin 10 MG tablet    Commonly known as: ZOCOR    Take 10 mg by mouth daily at 6 PM.    sodium phosphate enema    Commonly known as: FLEET    Place 1 enema rectally daily as needed (constipation). follow package directions    SYSTANE 0.4-0.3 % Soln    Generic drug: Polyethyl Glycol-Propyl Glycol    Place 1 drop into both eyes 4 (four) times daily.    tiotropium 18 MCG inhalation capsule    Commonly known as: SPIRIVA    Place 1 capsule (18 mcg total) into inhaler and inhale daily.    torsemide 20 MG tablet    Commonly known as: DEMADEX    Take 60 mg by mouth daily.    traZODone 150 MG tablet    Commonly known as: DESYREL    Take 150 mg by mouth at  bedtime.    trimethoprim 100 MG tablet    Commonly known as: TRIMPEX    Take 100 mg by mouth daily.    UTI-STAT Liqd    Take 30 mLs by mouth daily.    venlafaxine XR 150 MG 24 hr capsule    Commonly known as: EFFEXOR-XR    Take 300 mg by mouth every morning.     No orders of the defined types  were placed in this encounter.  Immunization History   Administered  Date(s) Administered   .  Influenza Split  10/20/2011   .  Influenza Whole  10/05/2007, 09/16/2008, 11/17/2010, 10/01/2013   .  Influenza,inj,Quad PF,36+ Mos  09/02/2014   .  Pneumococcal Polysaccharide-23  10/21/2003, 11/17/2010, 01/27/2012   .  Td  09/20/2003    History   Substance Use Topics   .  Smoking status:  Former Smoker -- 1.00 packs/day     Types:  Cigarettes     Quit date:  11/14/1986   .  Smokeless tobacco:  Never Used      Comment: pt cannot recall the total amt of time she smoked.   .  Alcohol Use:  No   Review of Systems  DATA OBTAINED: from patient, nurse;  GENERAL: no fevers, fatigue, appetite changes--  SKIN: No itching, rash  HEENT: Had previously been seen for sore throat does not complain of that this evening t  RESPIRATORY: No cough, wheezing, SOB -- CARDIAC: No chest pain, palpitations, lower extremity edema  GI: No abdominal pain, No N/V/D or constipation, No heartburn or reflux  GU: No dysuria, frequency or urgency, or incontinence  MUSCULOSKELETAL: No unrelieved bone/joint pain  NEUROLOGIC: No headache, dizziness  PSYCHIATRIC: Some history of anxiety                   Physical Exam   Temperature 97.4 pulse 97 respirations 20 blood pressure 124/65 O2 saturation 97% on chronic oxygen CBG is 224 GENERAL APPEARANCE: Alert, conversant, lying comfortably in bed somewhat anxious which is her baseline.  Again initially she was somnolent sleeping but with sternal rub did open her eyes and although sluggish initially within a matter of a couple  minutes was at her baseline quite animated  talking and oriented  SKIN: No diaphoresis rash, or wounds  HEENT: Oropharynx is clear I do not really note significant increased erythema there is no exudate noted  RESPIRATORY: Breathing is even, unlabored. Lung sounds are clear--with shallow entry-  CARDIOVASCULAR: Heart RRR-- no murmurs, rubs or gallops. trace peripheral edema  GASTROINTESTINAL: Abdomen is soft, non-tender, obese w/ normal bowel sounds.  MUSCULOSKELETAL: No abnormal joints or musculature  NEUROLOGIC: . Moves all extremities--has had baseline speech slurring which is baseline --otherwise cranial nerves appear to be intact her speech again is at baseline-pupils are reactive to light extraocular movements intact visual acuity appears intact moves all her extremities at baseline upper and lower extremities has baseline lower extremity weakness but this is not different than previous exams--grip strength is intact bilaterally  PSYCHIATRIC: no change from usual  Patient Active Problem List    Diagnosis  Date Noted   .  Dysphagia, oropharyngeal  09/28/2014   .  Neuropathy due to secondary diabetes  09/28/2014   .  Esophageal dysphagia  08/09/2014   .  Non compliance with medical treatment  08/09/2014   .  MRSA cellulitis  02/20/2014   .  Tardive dyskinesia  02/10/2014   .  Infection due to ESBL-producing Escherichia coli  02/02/2014   .  Sepsis  01/21/2014   .  Acute respiratory failure with hypoxia  01/21/2014   .  Fever, unspecified  01/21/2014   .  Septic shock  12/25/2013   .  UTI  12/25/2013   .  Acute encephalopathy  12/25/2013   .  Pneumonia, organism unspecified  12/21/2013   .  Cough  12/21/2013   .  Diarrhea  12/21/2013   .  Diaper candidiasis  12/17/2013   .  Dysuria  09/17/2013   .  Pure hyperglyceridemia  06/18/2013   .  Type 2 diabetes mellitus with neurological manifestations, uncontrolled  05/04/2013   .  Chronic pain syndrome  05/04/2013   .  Unspecified constipation  04/04/2013   .  Venous stasis  dermatitis  04/03/2012   .  COPD exacerbation  02/21/2012   .  Tachycardia  12/27/2011   .  Thrush  11/10/2011   .  Ankle arthritis  07/28/2011   .  Chronic right shoulder pain  06/03/2011   .  Leg swelling  03/03/2011   .  INSOMNIA, CHRONIC  11/17/2010   .  Other chronic pain  11/17/2010   .  [place]EN[place] FILTER INSERTION, HX OF  11/17/2010   .  NEOPLASM UNCERTAIN BEHAVIOR MAJOR SALIV GLANDS  11/10/2009   .  PULMONARY EMBOLISM  10/02/2009   .  DEEP VENOUS THROMBOPHLEBITIS, LEG, LEFT  10/02/2009   .  Pulmonary nodules  09/12/2009   .  Wheelchair dependence  04/10/2009   .  COR PULMONALE  05/22/2008   .  VENOUS INSUFFICIENCY, CHRONIC  03/25/2008   .  SYMPTOM, INCONTINENCE, URINARY NOS  07/13/2007   .  DIABETES MELLITUS, TYPE II  05/11/2007   .  ANEMIA-NOS  05/11/2007   .  GERD  05/11/2007   .  HYPERLIPIDEMIA  02/16/2007   .  Obesity, diabetes, and hypertension syndrome  02/16/2007   .  BIPOLAR DISORDER  02/16/2007   .  ANXIETY  02/16/2007   .  HYPERTENSION, BENIGN SYSTEMIC  02/16/2007   .  COPD (chronic obstructive pulmonary disease) with emphysema  02/16/2007   .  OSTEOPENIA  02/16/2007   .  TOBACCO ABUSE, HX OF  02/16/2007   Labs.  10/01/2014.  Sodium 133 potassium 3.9 BUN 13 creatinine 0.5.    Component  Value  Date/Time    WBC  8.4  09/12/2014 1558    RBC  4.27  09/12/2014 1558    HGB  12.1  09/12/2014 1558    HCT  38.5  09/12/2014 1558    PLT  226  09/12/2014 1558    MCV  90.2  09/12/2014 1558    LYMPHSABS  2.9  02/05/2014 0347    MONOABS  0.6  02/05/2014 0347    EOSABS  0.2  02/05/2014 0347    BASOSABS  0.1  02/05/2014 0347   CMP    Component  Value  Date/Time    NA  136*  09/12/2014 1558    K  4.6  09/12/2014 1558    CL  95*  09/12/2014 1558    CO2  26  09/12/2014 1558    GLUCOSE  207*  09/12/2014 1558    BUN  9  09/12/2014 1558    CREATININE  0.59  09/12/2014 1558    CREATININE  0.73  03/24/2011 1614    CALCIUM  9.4  09/12/2014 1558    PROT  7.1  01/21/2014 1139    ALBUMIN   3.2*  01/21/2014 1139    AST  30  01/21/2014 1139    ALT  14  01/21/2014 1139    ALKPHOS  57  01/21/2014 1139    BILITOT  0.2*  01/21/2014 1139    GFRNONAA  89*  09/12/2014 1558    GFRAA  >90  09/12/2014 1558   Assessment and Plan   Lethargy-this appeared to be of short duration she quickly returned  to her baseline with the sternal rub-vital signs have been unremarkable-at this point would be hesitant to alter her psychiatric medications-however would have to look at these if this becomes recurrent although this appears at this point have been just a single episode of sleeping pretty soundly-we will monitor with neuro checks every 1 hour with vital signs 2 then every 4 hours 2 and then every shift-also will obtain lab work tomorrow including a metabolic panel and a CBC with differential.  Certainly monitor for any continued somnolence and notify provider if this reoccurs.  Patient now is at her baseline.    [id number]--

## 2014-11-02 IMAGING — CT CT CHEST W/O CM
2 of 5 series · 15 of 36 positions shown, 18 images · IV contrast (Omnipaque 300)
Comparison: 03/18/2014 and 12/31/2013

CLINICAL DATA: Follow-up pulmonary nodules

EXAM:
CT CHEST WITHOUT CONTRAST
TECHNIQUE: Multidetector CT imaging of the chest was performed following the
standard protocol without IV contrast..

[Series 4: lungs · axial · 0.71mm/px · z∈[-214,+16]mm · 12 of 56 slices shown, 15 images]
[im 5/56  mediastinal]
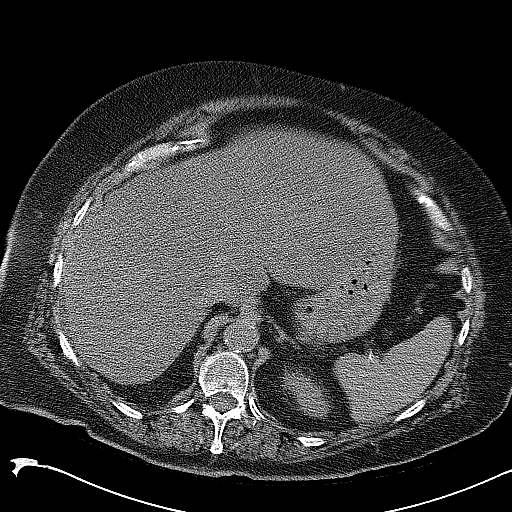
[im 5/56  lung]
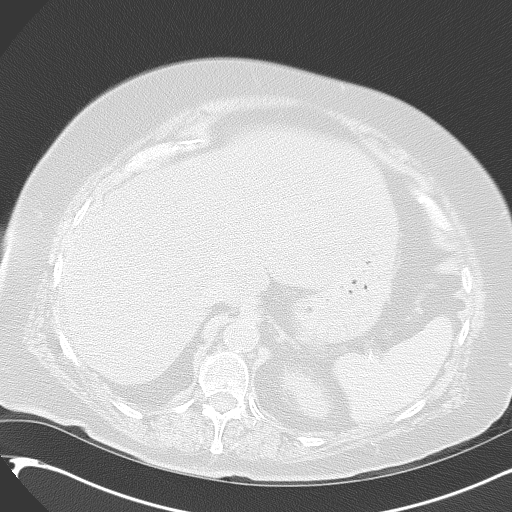
[im 9/56  lung]
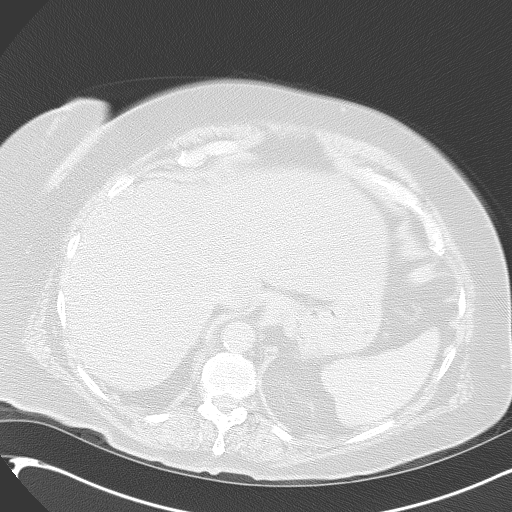
[im 13/56  lung]
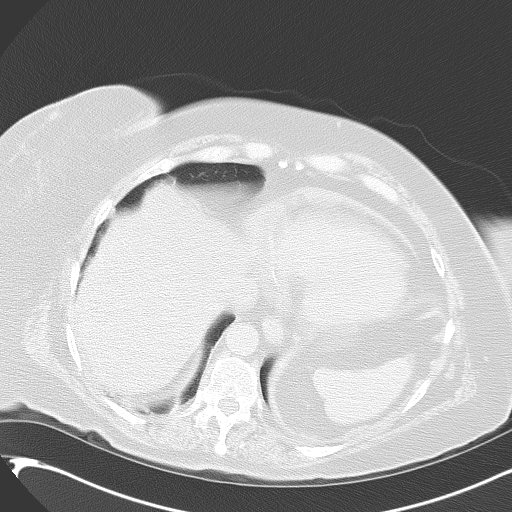
[im 17/56  lung]
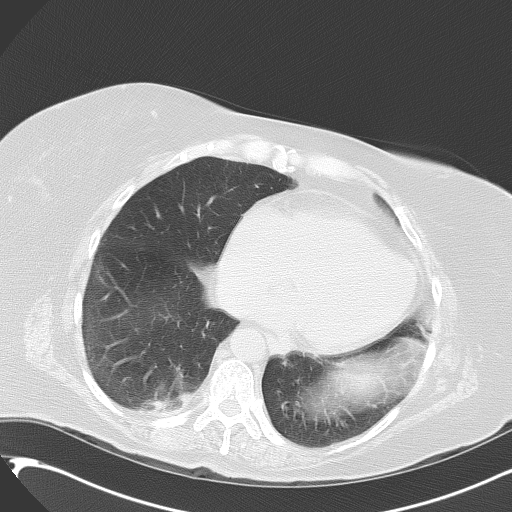
[im 22/56  mediastinal]
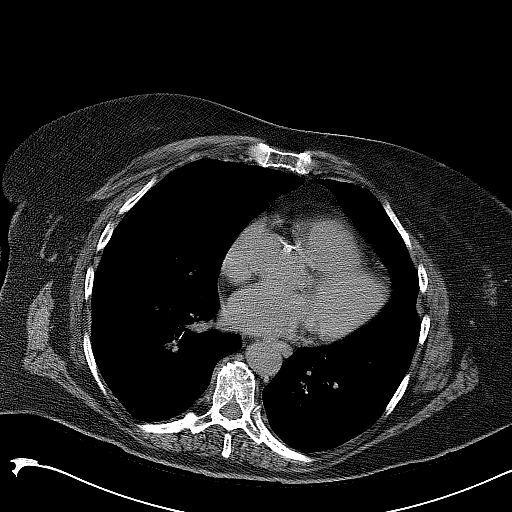
[im 22/56  lung]
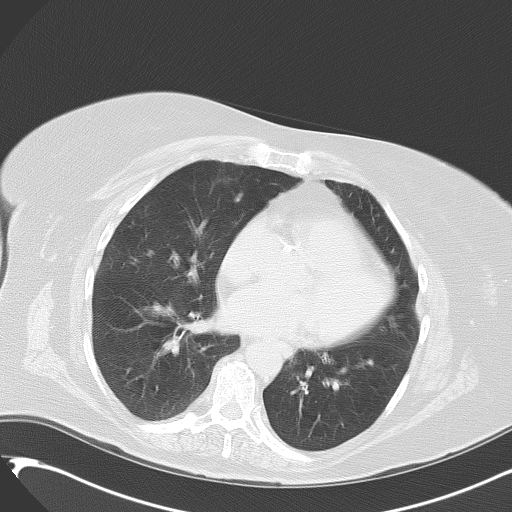
[im 26/56  lung]
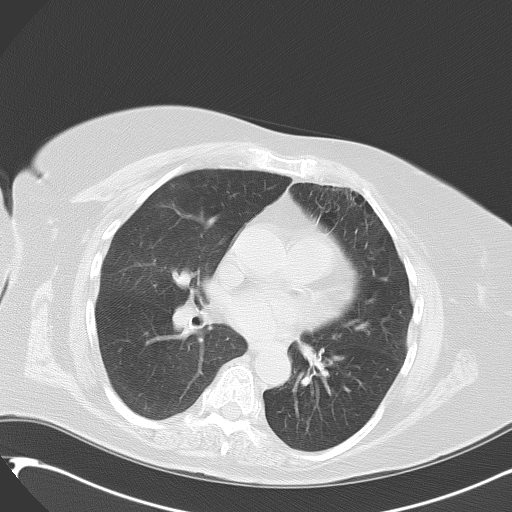
[im 30/56  lung]
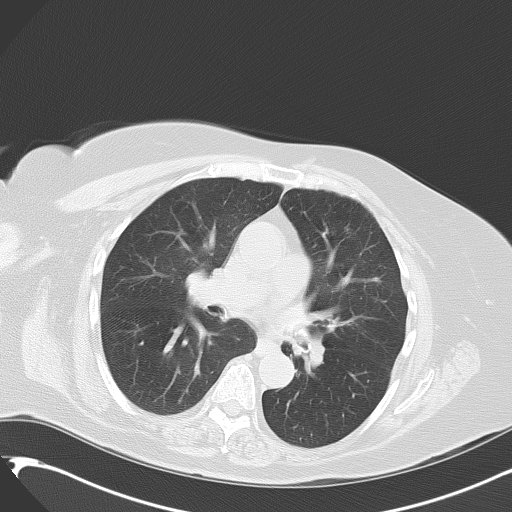
[im 34/56  lung]
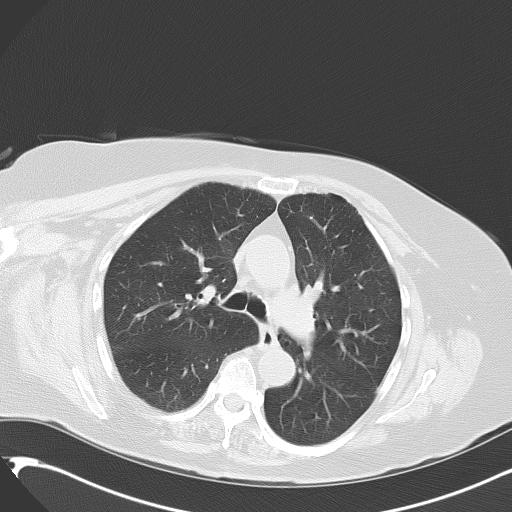
[im 39/56  mediastinal]
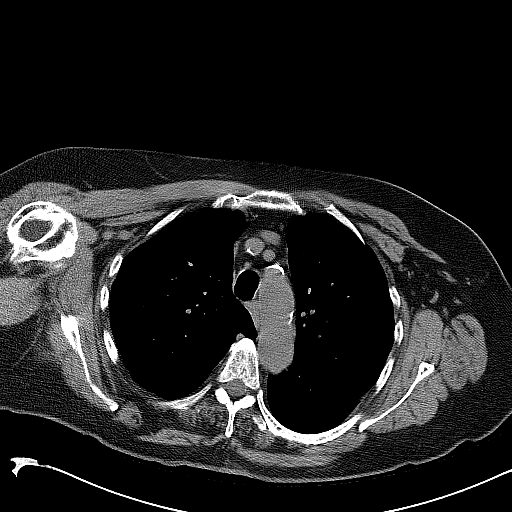
[im 39/56  lung]
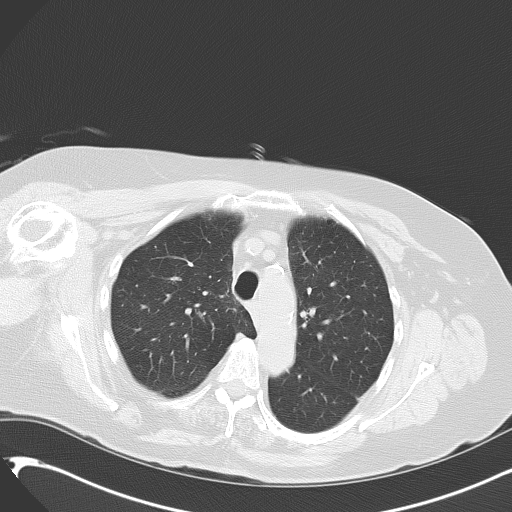
[im 43/56  lung]
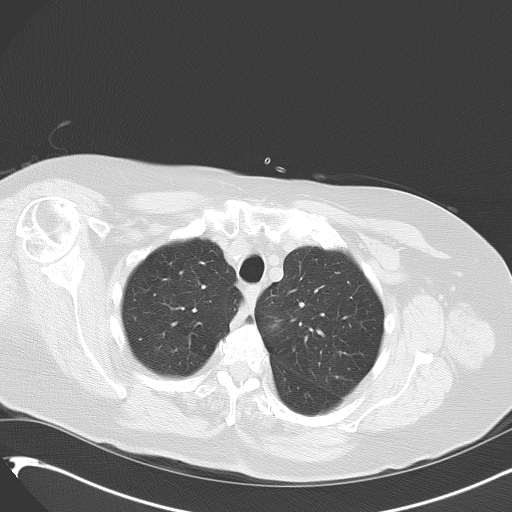
[im 47/56  lung]
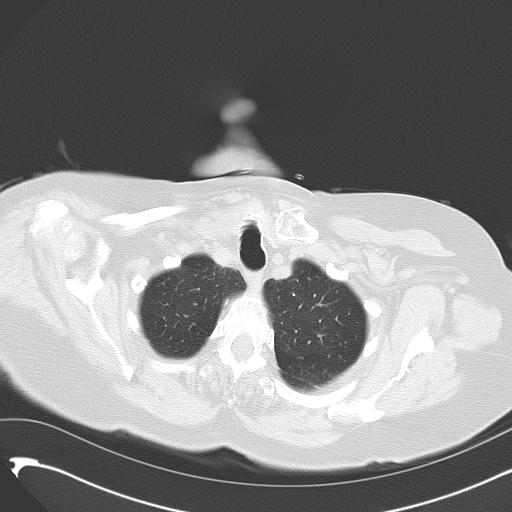
[im 51/56  lung]
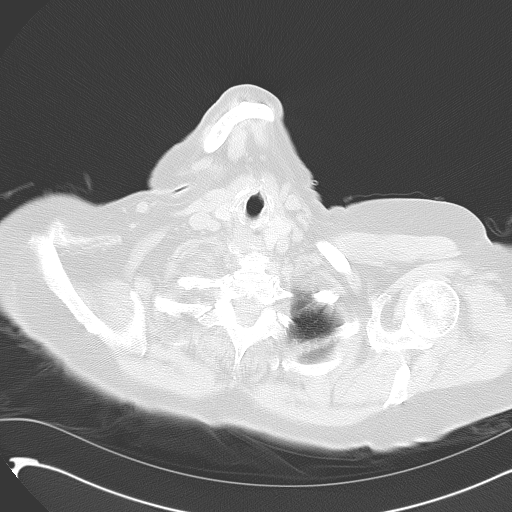

[Series 602: cor · coronal · 0.71mm/px · 3 of 111 slices shown]
[im 23/111  lung]
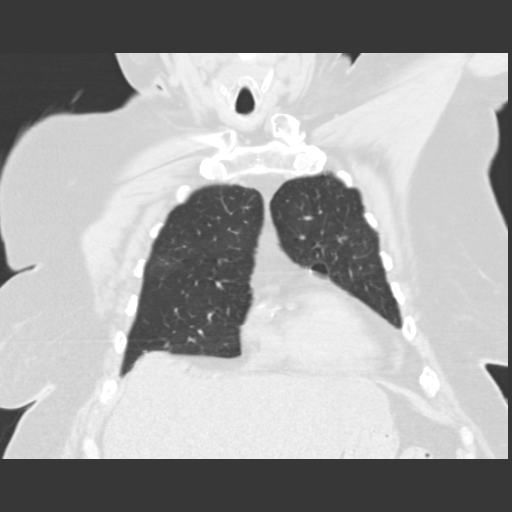
[im 45/111  lung]
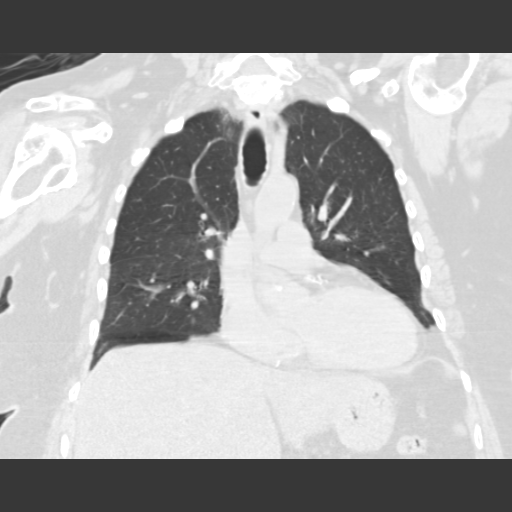
[im 67/111  lung]
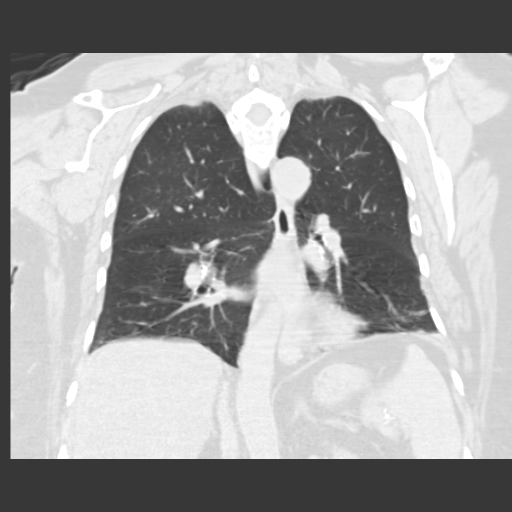

[15 of 36 positions shown; findings below may reference images not displayed]

FINDINGS: 6 mm nodule in the medial left upper lobe (series 4/ image 26),
grossly unchanged. 2-3 mm nodule in the right lower lobe (series 4/
image 33), unchanged. 4 mm ground-glass nodular opacity in the
medial right middle lobe (series 4/ image 39), unchanged.

Rounded atelectasis in the posterior right lower lobe (series
4/image 39).

No new/suspicious pulmonary nodules. Mild centrilobular and
paraseptal emphysematous changes. No pleural effusion or
pneumothorax.

Visualized thyroid is unremarkable.

The heart is normal size. Trace inferior pericardial fluid (series
3/ image 44). Coronary atherosclerosis. Atherosclerotic
calcifications of the aortic arch.

No suspicious mediastinal or axillary lymphadenopathy.

Visualized upper abdomen is notable for vascular calcifications and
hepatic steatosis.

Mild degenerative changes of the visualized thoracolumbar spine.
Mild superior endplate compression fracture deformity at T9,
chronic.
IMPRESSION: Scattered bilateral pulmonary nodules measuring up to 6 mm in the
medial left upper lobe, unchanged.

9 month stability has been demonstrated. Given that patient is high
risk for primary bronchogenic neoplasm, additional follow-up CT
chest is suggested in 12 months (18-24 months from initial CT).

This recommendation follows the consensus statement: Guidelines for
Management of Small Pulmonary Nodules Detected on CT Scans: A
Statement from the [HOSPITAL] as published in Radiology

## 2014-11-05 ENCOUNTER — Non-Acute Institutional Stay (SKILLED_NURSING_FACILITY): Payer: Medicare Other | Admitting: Internal Medicine

## 2014-11-05 ENCOUNTER — Other Ambulatory Visit: Payer: Self-pay | Admitting: *Deleted

## 2014-11-05 DIAGNOSIS — B9789 Other viral agents as the cause of diseases classified elsewhere: Principal | ICD-10-CM

## 2014-11-05 DIAGNOSIS — L089 Local infection of the skin and subcutaneous tissue, unspecified: Secondary | ICD-10-CM

## 2014-11-05 MED ORDER — OXYCODONE HCL ER 10 MG PO T12A: EXTENDED_RELEASE_TABLET | ORAL | Status: DC

## 2014-11-05 NOTE — Telephone Encounter (Signed)
[facility] 

## 2014-11-07 NOTE — Progress Notes (Addendum)
Patient ID: JERILEE SPACE, female   DOB: 1941/12/10, 73 y.o.   MRN: [id number]               PROGRESS NOTE  DATE:  11/05/2014    FACILITY: [facility]      LEVEL OF CARE:   SNF   Acute Visit   CHIEF COMPLAINT:    Review of buttock wounds.    HISTORY OF PRESENT ILLNESS:  Mrs. Harada is a fairly dependent lady with advanced COPD, severe arthritis.  She is largely nonambulatory and spends her day in the wheelchair.    She has developed small bilateral wounds over her gluteal folds superiorly.  These apparently start as small nodules.  The treatment nurse felt these were boils.  They seem to form shallow ulcers.  The patient does not describe these as being particularly painful.  They are not blisters or pustules.    PHYSICAL EXAMINATION:   SKIN:  INSPECTION:  Over bilateral areas of her upper gluteal folds are small open areas.  In one spot, these appear to have grouped vesicles, almost looking herpetic although they are not tender.  They seem to form shallow ulcers causing dermal necrosis.    ASSESSMENT/PLAN:       ?Viral lesions, ?herpetic.  I would suggest silver alginate to these areas.  This does not appear to be a zoster.   Nevertheless, I am going to give her a course of Famvir.

## 2014-12-08 ENCOUNTER — Non-Acute Institutional Stay (SKILLED_NURSING_FACILITY): Payer: Medicare Other | Admitting: Internal Medicine

## 2014-12-08 DIAGNOSIS — R059 Cough, unspecified: Secondary | ICD-10-CM

## 2014-12-08 DIAGNOSIS — R05 Cough: Secondary | ICD-10-CM

## 2014-12-08 NOTE — Progress Notes (Signed)
MRN: [id number] Name: [name]  Sex: female Age: 73 y.o. DOB: [birth year]  [place] #: [facility] Facility/Room:  Level Of Care: SNF Provider: Inocencio Homes D Emergency Contacts: Extended Emergency Contact Information Primary Emergency Contact: Wright,[name] Address: [place], [place] [address] [place] Work Phone: [phone] Mobile Phone: [phone] Relation: Daughter Secondary Emergency Contact: Rachelle Hora [place] Phone: [phone] Relation: Sister  Code Status: FULL  Allergies: Benzodiazepines; Morphine and related; Olanzapine; and Sulfamethoxazole-trimethoprim  Chief Complaint  Patient presents with  . Acute Visit    HPI: Patient is 72 y.o. female whose daughter insists that pt be seen and started on an antibiotic for her cough. Pt's daughter is often unrealistic about her mother and her mother's care.   Past Medical History  Diagnosis Date  . Anxiety   . Arthritis   . COPD (chronic obstructive pulmonary disease)   . GERD (gastroesophageal reflux disease)   . Hyperlipidemia   . Osteoporosis   . DVT (deep venous thrombosis)   . PE (pulmonary thromboembolism)   . Hypertension      PT DENIES....ON NO  MEDS  . Diabetes mellitus   . Cancer 1969    cervical  . Pneumonia     h/o  . Bronchitis     h/o  . Blood transfusion   . Anemia   . Bipolar affect, depressed     Past Surgical History  Procedure Laterality Date  . Vena cava filter placement    . 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 Eye surgery      CATARACTS  . Achilles tendon lengthening  11/18/11    and repair w/posterior tibial tendon lengthening; right  foot  . Cholecystectomy    . Abdominal hysterectomy  1969    "womb taken out for cervical cancer"  . Cataract extraction, bilateral  ~ 2008  . Tonsillectomy      "as a child"  . Achilles tendon surgery  11/18/2011    Procedure: ACHILLES TENDON REPAIR;  Surgeon: Wylene Simmer, MD;  Location: [facility];  Service: Orthopedics;  Laterality: Right;  Right Posterior Tibial Tendon Lenghtening and Tendon Achilles Lenghtening       Medication List       This list is accurate as of: 12/08/14 11:59 PM.  Always use your most recent med list.               acetaminophen 325 MG tablet  Commonly known as:  TYLENOL  Take 650 mg by mouth every 4 (four) hours as needed for mild pain.     ALPRAZolam 1 MG tablet  Commonly known as:  XANAX  Take 1 mg by mouth 2 (two) times daily. Take at 0600, 1000, 1400, and 1800.     alum & mag hydroxide-simeth 200-200-20 MG/5ML suspension  Commonly known as:  MAALOX/MYLANTA  Take 30 mLs by mouth every 6 (six) hours as needed for indigestion or heartburn.     antiseptic oral rinse Liqd  15 mLs by Mouth Rinse route every 4 (four) hours as needed for dry mouth.     aspirin 81 MG chewable tablet  Chew 81 mg by mouth daily.     bisacodyl 10 MG suppository  Commonly known as:  DULCOLAX  Place 10 mg rectally as directed. On Monday, Wednesday, and Friday and daily as needed if no bowel movement in  2 days.     CHLORASEPTIC 6-10 MG lozenge  Generic drug:  benzocaine-menthol  Take 1 lozenge by mouth every 2 (two) hours as needed for sore throat.     esomeprazole 40 MG capsule  Commonly known as:  NEXIUM  Take 40 mg by mouth daily at 12 noon.     fenofibrate 145 MG tablet  Commonly known as:  TRICOR  Take 145 mg by mouth daily.     fish oil-omega-3 fatty acids 1000 MG capsule  Take 1 g by mouth 2 (two) times daily.     Fluticasone-Salmeterol 250-50 MCG/DOSE Aepb  Commonly known as:  ADVAIR DISKUS  Inhale 1 puff into the lungs 2 (two) times daily.     gabapentin 300 MG capsule  Commonly known as:  NEURONTIN  Take 3 capsules (900 mg total) by mouth 3 (three) times daily.     HYDROcodone-acetaminophen 5-325 MG per tablet  Commonly known as:  NORCO/VICODIN  Take 1 tablet by mouth every 6 (six) hours as needed for moderate  pain.     insulin aspart 100 UNIT/ML FlexPen  Commonly known as:  NOVOLOG  Inject 3-5 Units into the skin 3 (three) times daily before meals. Give 3 units before meals with an additional 5 units for CBG> or < 150     insulin glargine 100 UNIT/ML injection  Commonly known as:  LANTUS  Inject 22 Units into the skin at bedtime.     lamoTRIgine 25 MG tablet  Commonly known as:  LAMICTAL  Take 150 mg by mouth daily.     levalbuterol 1.25 MG/0.5ML nebulizer solution  Commonly known as:  XOPENEX  Take 1.25 mg by nebulization 4 (four) times daily as needed for wheezing or shortness of breath.     levalbuterol 45 MCG/ACT inhaler  Commonly known as:  XOPENEX HFA  Inhale 2 puffs into the lungs every 4 (four) hours as needed for wheezing.     loperamide 2 MG capsule  Commonly known as:  IMODIUM  Take 2 mg by mouth as needed for diarrhea or loose stools.     loratadine 10 MG tablet  Commonly known as:  CLARITIN  Take 10 mg by mouth daily.     LORazepam 2 MG/ML concentrated solution  Commonly known as:  ATIVAN  Take 1 mg by mouth every 12 (twelve) hours as needed for anxiety (and agitation.).     LORazepam 2 MG/ML injection  Commonly known as:  ATIVAN  Inject 2ml intramuscular every 12 hours for severe anxiety/agitation     magnesium hydroxide 400 MG/5ML suspension  Commonly known as:  MILK OF MAGNESIA  Take 30 mLs by mouth daily as needed for mild constipation.     metFORMIN 1000 MG tablet  Commonly known as:  GLUCOPHAGE  Take 1,000 mg by mouth 2 (two) times daily with a meal.     metoprolol tartrate 25 MG tablet  Commonly known as:  LOPRESSOR  Take 25 mg by mouth 2 (two) times daily. At 0800 and 1700.     naproxen sodium 220 MG tablet  Commonly known as:  ANAPROX  Take 220 mg by mouth 2 (two) times daily with a meal.     OxyCODONE 10 mg T12a 12 hr tablet  Commonly known as:  OXYCONTIN  Take one tablet by mouth every 12 hours for pain. Do not crush     OXYGEN  Inhale 2 L  into the lungs continuous. Via nasal cannula.     polyethylene  glycol packet  Commonly known as:  MIRALAX / GLYCOLAX  Take 17 g by mouth daily.     polyvinyl alcohol 1.4 % ophthalmic solution  Commonly known as:  LIQUIFILM TEARS  Place 2 drops into both eyes 4 (four) times daily as needed for dry eyes.     potassium chloride 10 MEQ tablet  Commonly known as:  K-DUR,KLOR-CON  Take 5 mEq by mouth every evening.     promethazine 25 MG tablet  Commonly known as:  PHENERGAN  Take 25 mg by mouth every 6 (six) hours as needed. nausea     QUEtiapine 300 MG tablet  Commonly known as:  SEROQUEL  Take 100 mg by mouth at bedtime.     REFRESH LACRI-LUBE Oint  Place 1 application into both eyes at bedtime.     risperiDONE 2 MG tablet  Commonly known as:  RISPERDAL  Take 2 mg by mouth 2 (two) times daily.     simvastatin 10 MG tablet  Commonly known as:  ZOCOR  Take 10 mg by mouth daily at 6 PM.     sodium phosphate enema  Commonly known as:  FLEET  Place 1 enema rectally daily as needed (constipation). follow package directions     SYSTANE 0.4-0.3 % Soln  Generic drug:  Polyethyl Glycol-Propyl Glycol  Place 1 drop into both eyes 4 (four) times daily.     tiotropium 18 MCG inhalation capsule  Commonly known as:  SPIRIVA  Place 1 capsule (18 mcg total) into inhaler and inhale daily.     torsemide 20 MG tablet  Commonly known as:  DEMADEX  Take 60 mg by mouth daily.     traZODone 150 MG tablet  Commonly known as:  DESYREL  Take 150 mg by mouth at bedtime.     trimethoprim 100 MG tablet  Commonly known as:  TRIMPEX  Take 100 mg by mouth daily.     UTI-STAT Liqd  Take 30 mLs by mouth daily.     venlafaxine XR 150 MG 24 hr capsule  Commonly known as:  EFFEXOR-XR  Take 300 mg by mouth every morning.        No orders of the defined types were placed in this encounter.    Immunization History  Administered Date(s) Administered  . Influenza Split 10/20/2011  .  Influenza Whole 10/05/2007, 09/16/2008, 11/17/2010, 10/01/2013  . Influenza,inj,Quad PF,36+ Mos 09/02/2014  . Pneumococcal Polysaccharide-23 10/21/2003, 11/17/2010, 01/27/2012  . Td 09/20/2003    History  Substance Use Topics  . Smoking status: Former Smoker -- 1.00 packs/day    Types: Cigarettes    Quit date: 11/14/1986  . Smokeless tobacco: Never Used     Comment: pt cannot recall the total amt of time she smoked.  . Alcohol Use: No    Review of Systems  DATA OBTAINED: from patient, nurse GENERAL:  no fevers, some fatigue,no  appetite changes SKIN: No itching, rash HEENT: No complaint RESPIRATORY: + cough, no wheezing,no  SOB;says sputum has color but all kleenexs have clear sputum but the one that has color pt threw away above head of bed CARDIAC: No chest pain, palpitations, lower extremity edema  GI: No abdominal pain, No N/V/D or constipation, No heartburn or reflux  GU: No dysuria, frequency or urgency, or incontinence  MUSCULOSKELETAL: No unrelieved bone/joint pain NEUROLOGIC: No headache, dizziness  PSYCHIATRIC: No overt anxiety or sadness  Filed Vitals:   12/08/14 1258  BP: 134/81  Pulse: 88  Temp: 98 F (36.7  C)  Resp: 22    Physical Exam  GENERAL APPEARANCE: Alert, conversant, No acute distress  SKIN: No diaphoresis rash HEENT: Unremarkable;mild rhinnorhea RESPIRATORY: Breathing is even, unlabored. Lung sounds are with mild rhonchi, no wheeze, no rales   CARDIOVASCULAR: Heart RRR no murmurs, rubs or gallops. No peripheral edema  GASTROINTESTINAL: Abdomen is soft, non-tender, not distended w/ normal bowel sounds.  GENITOURINARY: Bladder non tender, not distended  MUSCULOSKELETAL: No abnormal joints or musculature NEUROLOGIC: Cranial nerves 2-12 grossly intact. Moves all extremities PSYCHIATRIC: odd affect, constant behavioral issues, one small example- today pt refuses CXR  Patient Active Problem List   Diagnosis Date Noted  . Lethargy 10/17/2014  .  Sore throat 10/08/2014  . Chest congestion 10/08/2014  . Dysphagia, oropharyngeal 09/28/2014  . Neuropathy due to secondary diabetes 09/28/2014  . Esophageal dysphagia 08/09/2014  . Non compliance with medical treatment 08/09/2014  . MRSA cellulitis 02/20/2014  . Tardive dyskinesia 02/10/2014  . Infection due to ESBL-producing Escherichia coli 02/02/2014  . Sepsis 01/21/2014  . Acute respiratory failure with hypoxia 01/21/2014  . Fever, unspecified 01/21/2014  . Septic shock 12/25/2013  . UTI 12/25/2013  . Acute encephalopathy 12/25/2013  . Pneumonia, organism unspecified 12/21/2013  . Cough 12/21/2013  . Diarrhea 12/21/2013  . Diaper candidiasis 12/17/2013  . Dysuria 09/17/2013  . Pure hyperglyceridemia 06/18/2013  . Type 2 diabetes mellitus with neurological manifestations, uncontrolled 05/04/2013  . Chronic pain syndrome 05/04/2013  . Unspecified constipation 04/04/2013  . Venous stasis dermatitis 04/03/2012  . COPD exacerbation 02/21/2012  . Tachycardia 12/27/2011  . Thrush 11/10/2011  . Ankle arthritis 07/28/2011  . Chronic right shoulder pain 06/03/2011  . Leg swelling 03/03/2011  . INSOMNIA, CHRONIC 11/17/2010  . Other chronic pain 11/17/2010  . GREENFIELD FILTER INSERTION, HX OF 11/17/2010  . NEOPLASM UNCERTAIN BEHAVIOR MAJOR SALIV GLANDS 11/10/2009  . PULMONARY EMBOLISM 10/02/2009  . DEEP VENOUS THROMBOPHLEBITIS, LEG, LEFT 10/02/2009  . Pulmonary nodules 09/12/2009  . Wheelchair dependence 04/10/2009  . COR PULMONALE 05/22/2008  . VENOUS INSUFFICIENCY, CHRONIC 03/25/2008  . SYMPTOM, INCONTINENCE, URINARY NOS 07/13/2007  . DIABETES MELLITUS, TYPE II 05/11/2007  . ANEMIA-NOS 05/11/2007  . GERD 05/11/2007  . HYPERLIPIDEMIA 02/16/2007  . Obesity, diabetes, and hypertension syndrome 02/16/2007  . BIPOLAR DISORDER 02/16/2007  . ANXIETY 02/16/2007  . HYPERTENSION, BENIGN SYSTEMIC 02/16/2007  . COPD (chronic obstructive pulmonary disease) with emphysema 02/16/2007   . OSTEOPENIA 02/16/2007  . TOBACCO ABUSE, HX OF 02/16/2007    CBC    Component Value Date/Time   WBC 8.4 09/12/2014 1558   RBC 4.27 09/12/2014 1558   HGB 12.1 09/12/2014 1558   HCT 38.5 09/12/2014 1558   PLT 226 09/12/2014 1558   MCV 90.2 09/12/2014 1558   LYMPHSABS 2.9 02/05/2014 0347   MONOABS 0.6 02/05/2014 0347   EOSABS 0.2 02/05/2014 0347   BASOSABS 0.1 02/05/2014 0347    CMP     Component Value Date/Time   NA 136* 09/12/2014 1558   K 4.6 09/12/2014 1558   CL 95* 09/12/2014 1558   CO2 26 09/12/2014 1558   GLUCOSE 207* 09/12/2014 1558   BUN 9 09/12/2014 1558   CREATININE 0.59 09/12/2014 1558   CREATININE 0.73 03/24/2011 1614   CALCIUM 9.4 09/12/2014 1558   PROT 7.1 01/21/2014 1139   ALBUMIN 3.2* 01/21/2014 1139   AST 30 01/21/2014 1139   ALT 14 01/21/2014 1139   ALKPHOS 57 01/21/2014 1139   BILITOT 0.2* 01/21/2014 1139   GFRNONAA 89* 09/12/2014 1558  GFRAA >90 09/12/2014 1558    Assessment and Plan  Cough Reported for 2 days and pt with decreased activity for 2 days. Pt asked for and got cough syrup. No SOB or fever. Pt looks like she has a cold. All tissues with scan clear sputum. Another resident, friend of pt stopped by her room and talked her into a CXR. CXR was read as negative, came back later today. Exam also not c/w PNA. Plan tx sx and observe for changes.   Time spent with pt -always takes a long time because of behavoirs. > 30 min Hennie Duos, MD

## 2014-12-09 ENCOUNTER — Other Ambulatory Visit: Payer: Self-pay | Admitting: *Deleted

## 2014-12-09 MED ORDER — LORAZEPAM 2 MG/ML IJ SOLN: INTRAMUSCULAR | Status: DC

## 2014-12-09 NOTE — Telephone Encounter (Signed)
[facility] 

## 2014-12-12 ENCOUNTER — Other Ambulatory Visit: Payer: Self-pay

## 2014-12-12 MED ORDER — OXYCODONE HCL ER 10 MG PO T12A: EXTENDED_RELEASE_TABLET | ORAL | Status: DC

## 2014-12-12 NOTE — Telephone Encounter (Signed)
RX sent to [facility] @ [phone]. Phone number [phone]  

## 2014-12-15 ENCOUNTER — Encounter: Payer: Self-pay | Admitting: Internal Medicine

## 2014-12-15 NOTE — Assessment & Plan Note (Signed)
Reported for 2 days and pt with decreased activity for 2 days. Pt asked for and got cough syrup. No SOB or fever. Pt looks like she has a cold. All tissues with scan clear sputum. Another resident, friend of pt stopped by her room and talked her into a CXR. CXR was read as negative, came back later today. Exam also not c/w PNA. Plan tx sx and observe for changes.

## 2015-01-06 ENCOUNTER — Non-Acute Institutional Stay (SKILLED_NURSING_FACILITY): Payer: Medicare Other | Admitting: Internal Medicine

## 2015-01-06 ENCOUNTER — Encounter: Payer: Self-pay | Admitting: Internal Medicine

## 2015-01-06 DIAGNOSIS — R059 Cough, unspecified: Secondary | ICD-10-CM

## 2015-01-06 DIAGNOSIS — J439 Emphysema, unspecified: Secondary | ICD-10-CM

## 2015-01-06 DIAGNOSIS — E1340 Other specified diabetes mellitus with diabetic neuropathy, unspecified: Secondary | ICD-10-CM

## 2015-01-06 DIAGNOSIS — F319 Bipolar disorder, unspecified: Secondary | ICD-10-CM

## 2015-01-06 DIAGNOSIS — G894 Chronic pain syndrome: Secondary | ICD-10-CM

## 2015-01-06 DIAGNOSIS — E119 Type 2 diabetes mellitus without complications: Secondary | ICD-10-CM

## 2015-01-06 DIAGNOSIS — M7989 Other specified soft tissue disorders: Secondary | ICD-10-CM

## 2015-01-06 DIAGNOSIS — R05 Cough: Secondary | ICD-10-CM

## 2015-01-06 NOTE — Progress Notes (Signed)
Patient ID: [name], female   DOB: 09-11-1941, 74 y.o.   MRN: [id number]   this is a routine visit-acute visit.  Level care skilled.  [place]   Patient presents with  . Medical Management of Chronic Issues--acute visit secondary to cough     HPI: Patient is 74 y.o. female who is being seen for routine issues--- as well as for a cough productive of green phlegm  Patient does have a significant history of COPD she is afebrile does not really complain of increased shortness of breath feel she is having possibly some more chest congestion-is quite concerned about the green phlegm since apparently her phlegm usually is clear--she continues on Xopenex nebulizers as needed as well as numerous other medications including Spiriva and levalbuterol    Other than respiratory issues or other issues appear to be relatively stable she does have a significant history of psychiatric issues and is followed closely by the psychiatric nurse practitioner this appears to be relatively stable although there still continued to be apparently some frequent behavior yelling episodes which have been somewhat difficult to control at times  She is a diabetic on Lantus insulin-blood sugars appear to be somewhat variable morning sugars appear to run generally in the 90s to higher 100s-later in the day there is more variability I do see somewhat frequent 200s late in the day I suspect there is some dietary issues here with eating sweets  Hemoglobin A1c was 8.2 last October this will need to be updated   Past Medical History  Diagnosis Date  . Anxiety   . Arthritis   . COPD (chronic obstructive pulmonary disease)   . GERD (gastroesophageal reflux disease)   . Hyperlipidemia   . Osteoporosis   . DVT (deep venous thrombosis)   . PE (pulmonary thromboembolism)   . Hypertension      PT DENIES....ON NO  MEDS  . Diabetes mellitus   . Cancer 1969    cervical  . Pneumonia     h/o  . Bronchitis    h/o  . Blood transfusion   . Anemia   . Bipolar affect, depressed     Past Surgical History  Procedure Laterality Date  . Vena cava filter placement    . 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 Eye surgery      CATARACTS  . Achilles tendon lengthening  11/18/11    and repair w/posterior tibial tendon lengthening; right  foot  . Cholecystectomy    . Abdominal hysterectomy  1969    "womb taken out for cervical cancer"  . Cataract extraction, bilateral  ~ 2008  . Tonsillectomy      "as a child"  . Achilles tendon surgery  11/18/2011    Procedure: ACHILLES TENDON REPAIR;  Surgeon: Wylene Simmer, MD;  Location: [facility];  Service: Orthopedics;  Laterality: Right;  Right Posterior Tibial Tendon Lenghtening and Tendon Achilles Lenghtening       Medication List       reviewed per MAR s  No orders of the defined types were placed in this encounter.    Immunization History  Administered Date(s) Administered  . Influenza Split 10/20/2011  . Influenza Whole 10/05/2007, 09/16/2008, 11/17/2010, 10/01/2013  . Influenza,inj,Quad PF,36+ Mos 09/02/2014  . Pneumococcal Polysaccharide-23 10/21/2003, 11/17/2010, 01/27/2012  . Td 09/20/2003    History  Substance Use Topics  . Smoking status: Former Smoker -- 1.00 packs/day    Types: Cigarettes    Quit date: 11/14/1986  . Smokeless tobacco: Never Used     Comment: pt cannot recall the total amt of time she smoked.  . Alcohol Use: No    Review of Systems  DATA OBTAINED: from patient, nurse;  GENERAL:  no fevers, fatigue, appetite changes SKIN: No itching, rash HEENT: No complaint RESPIRATORY: Has a cough productive of green phlegm  no, wheezing,  No increased  SOBcompared to baseline  CARDIAC: No chest pain, palpitations   trace, lower extremity edema  GI: No abdominal pain, No N/V/D or constipation, No heartburn or reflux  GU: No dysuria, frequency or urgency, or incontinence  MUSCULOSKELETAL: No unrelieved bone/joint pain NEUROLOGIC: No headache, dizziness  PSYCHIATRIC: No overt anxiety or sadness                      Physical Exam Temperature 98.2 pulse 72 respirations 18 blood pressure 132/74 GE  NERAL APPEARANCE: Alert, conversant, No acute distress sitting comfortably in her wheelchair  SKIN: No diaphoresis rash, or wounds HEENT: oropharynx is clear mucous membranes moist  RESPIRATORY: Breathing is even, unlabored. --shallow air entry some slight chest congestion noted at bases small amount coarse breath sounds   CARDIOVASCULAR: Heart RRR no murmurs, rubs or gallops. trace peripheral edema  GASTROINTESTINAL: Abdomen is soft, non-tender, not distended w/ normal bowel sounds.  GENITOURINARY: Bladder non tender, not distended  MUSCULOSKELETAL: No abnormal joints or musculature NEUROLOGIC: Cranial nerves 2-12 grossly intact. Moves all extremities PSYCHIATRIC: At baseline-mildly anxious-however pleasant   Patient Active Problem List   Diagnosis Date Noted  . Dysphagia, oropharyngeal 09/28/2014  . Neuropathy due to secondary diabetes 09/28/2014  . Esophageal dysphagia 08/09/2014  . Non compliance with medical treatment 08/09/2014  . MRSA cellulitis 02/20/2014  . Tardive dyskinesia 02/10/2014  . Infection due to ESBL-producing Escherichia coli 02/02/2014  . Sepsis 01/21/2014  . Acute respiratory failure with hypoxia 01/21/2014  . Fever, unspecified 01/21/2014  . Septic shock 12/25/2013  . UTI 12/25/2013  . Acute encephalopathy 12/25/2013  . Pneumonia, organism unspecified 12/21/2013  . Cough 12/21/2013  . Diarrhea 12/21/2013  . Diaper candidiasis 12/17/2013  . Dysuria 09/17/2013  . Pure hyperglyceridemia 06/18/2013  . Type 2 diabetes mellitus with neurological manifestations,  uncontrolled 05/04/2013  . Chronic pain syndrome 05/04/2013  . Unspecified constipation 04/04/2013  . Venous stasis dermatitis 04/03/2012  . COPD exacerbation 02/21/2012  . Tachycardia 12/27/2011  . Thrush 11/10/2011  . Ankle arthritis 07/28/2011  . Chronic right shoulder pain 06/03/2011  . Leg swelling 03/03/2011  . INSOMNIA, CHRONIC 11/17/2010  . Other chronic pain 11/17/2010  . GREENFIELD FILTER INSERTION, HX OF 11/17/2010  . NEOPLASM UNCERTAIN BEHAVIOR MAJOR SALIV GLANDS 11/10/2009  . PULMONARY EMBOLISM 10/02/2009  . DEEP VENOUS THROMBOPHLEBITIS, LEG, LEFT 10/02/2009  . Pulmonary nodules 09/12/2009  . Wheelchair dependence 04/10/2009  . COR PULMONALE 05/22/2008  . VENOUS INSUFFICIENCY, CHRONIC 03/25/2008  . SYMPTOM, INCONTINENCE, URINARY NOS 07/13/2007  . DIABETES MELLITUS, TYPE II 05/11/2007  . ANEMIA-NOS 05/11/2007  . GERD 05/11/2007  . HYPERLIPIDEMIA 02/16/2007  . Obesity, diabetes,  and hypertension syndrome 02/16/2007  . BIPOLAR DISORDER 02/16/2007  . ANXIETY 02/16/2007  . HYPERTENSION, BENIGN SYSTEMIC 02/16/2007  . COPD (chronic obstructive pulmonary disease) with emphysema 02/16/2007  . OSTEOPENIA 02/16/2007  . TOBACCO ABUSE, HX OF 02/16/2007    Labs.  10/18/2014.  WBC 6.8 hemoglobin 11.9 platelets 244.  Sodium 137 potassium 3.9 BUN 10 creatinine 0.68-liver function tests within normal limits.  Hemoglobin A1c-8.2     Component Value Date/Time   WBC 8.4 09/12/2014 1558   RBC 4.27 09/12/2014 1558   HGB 12.1 09/12/2014 1558   HCT 38.5 09/12/2014 1558   PLT 226 09/12/2014 1558   MCV 90.2 09/12/2014 1558   LYMPHSABS 2.9 02/05/2014 0347   MONOABS 0.6 02/05/2014 0347   EOSABS 0.2 02/05/2014 0347   BASOSABS 0.1 02/05/2014 0347    CMP     Component Value Date/Time   NA 136* 09/12/2014 1558   K 4.6 09/12/2014 1558   CL 95* 09/12/2014 1558   CO2 26 09/12/2014 1558   GLUCOSE 207* 09/12/2014 1558   BUN 9 09/12/2014 1558   CREATININE 0.59 09/12/2014 1558   CREATININE  0.73 03/24/2011 1614   CALCIUM 9.4 09/12/2014 1558   PROT 7.1 01/21/2014 1139   ALBUMIN 3.2* 01/21/2014 1139   AST 30 01/21/2014 1139   ALT 14 01/21/2014 1139   ALKPHOS 57 01/21/2014 1139   BILITOT 0.2* 01/21/2014 1139   GFRNONAA 89* 09/12/2014 1558   GFRAA >90 09/12/2014 1558    Assessment and Plan   cough with green sputum-patient does have a fairly significant respiratory history- will order a chest x-ray as well as Mucinex 600 mg twice a day for 7 days continues previous medications as noted above.  Also will start her on a Z-Pak -she states this has helped in the past--- suspicion of some bacterial etiology here.    Esophageal dysphagia Pt was seen by Dr Richmond Campbell 09/09/2014, presumably for vomiting, which was reported to be increased since pt was on thickened liquids. Pt has been non compliant with thickened liquids and apparently is on thin liquids-she continues to have an aspiration risk-Dr. Sheppard [name] has evaluated this per prior note on September 28 2014-clinically at this point appears stable but is again at risk here .  Dysphagia, oropharyngeal   evaluated as noted above  Type 2 diabetes mellitus with neurological manifestations, uncontrolled  diabetes is difficult to control secondary to dietary noncompliance-will update hemoglobin A1c she is on Lantus  Twice a day-20-22 units  .   BIPOLAR DISORDER Pt is on scheduled xanax, effexor, lamictal, , seroquel, risperdol and trazodone  As well as Ativan when necessary--this continues to be a challenge she is followed closely by psychiatric nurse practitioner   Neuropathy due to secondary diabetes Continue neurontin 900 mg TID this appears to be relatively stable  Leg swelling Waxes and wanes -- at this point appears to be relatively stable she is on Demadex 40 mg a day-- Will update metabolic panel  Obesity, diabetes, and hypertension syndrome  has had some weight loss, which is desirable ; DM is poorly controlled due to pt  compliance and HTN is fairly controlled. Recent blood pressures 132/74-136/62  GERD Pt on nexium and multiple constipation/diarrhea prns-- there are occasional complaints here but she is not complaining of this today    of note also will update a CBC next laboratory day to keep benign on her hemoglobin and white count.   [id number] note greater than 35 minutes spent assessing  patient- reviewing her chart-and coordinating and formulating a plan of care for numerous diagnoses- of note greater than 50% of time spent coordinating plan of care.

## 2015-01-21 ENCOUNTER — Other Ambulatory Visit: Payer: Self-pay | Admitting: *Deleted

## 2015-01-21 MED ORDER — OXYCODONE HCL ER 10 MG PO T12A: EXTENDED_RELEASE_TABLET | ORAL | Status: DC

## 2015-01-21 NOTE — Telephone Encounter (Signed)
[facility] services

## 2015-01-28 ENCOUNTER — Other Ambulatory Visit: Payer: Self-pay | Admitting: *Deleted

## 2015-01-28 MED ORDER — HYDROCODONE-ACETAMINOPHEN 5-325 MG PO TABS: 1.0000 | ORAL_TABLET | Freq: Four times a day (QID) | ORAL | Status: DC | PRN

## 2015-01-28 NOTE — Telephone Encounter (Signed)
[facility] 

## 2015-02-07 ENCOUNTER — Encounter: Payer: Self-pay | Admitting: Internal Medicine

## 2015-02-07 ENCOUNTER — Non-Acute Institutional Stay (SKILLED_NURSING_FACILITY): Payer: Medicare Other | Admitting: Internal Medicine

## 2015-02-07 DIAGNOSIS — B372 Candidiasis of skin and nail: Secondary | ICD-10-CM | POA: Diagnosis not present

## 2015-02-07 DIAGNOSIS — Z9119 Patient's noncompliance with other medical treatment and regimen: Secondary | ICD-10-CM

## 2015-02-07 DIAGNOSIS — I10 Essential (primary) hypertension: Secondary | ICD-10-CM | POA: Diagnosis not present

## 2015-02-07 DIAGNOSIS — R1312 Dysphagia, oropharyngeal phase: Secondary | ICD-10-CM

## 2015-02-07 DIAGNOSIS — Z86711 Personal history of pulmonary embolism: Secondary | ICD-10-CM | POA: Diagnosis not present

## 2015-02-07 DIAGNOSIS — K219 Gastro-esophageal reflux disease without esophagitis: Secondary | ICD-10-CM | POA: Diagnosis not present

## 2015-02-07 DIAGNOSIS — F319 Bipolar disorder, unspecified: Secondary | ICD-10-CM

## 2015-02-07 DIAGNOSIS — E785 Hyperlipidemia, unspecified: Secondary | ICD-10-CM | POA: Diagnosis not present

## 2015-02-07 DIAGNOSIS — E119 Type 2 diabetes mellitus without complications: Secondary | ICD-10-CM | POA: Diagnosis not present

## 2015-02-07 DIAGNOSIS — Z95828 Presence of other vascular implants and grafts: Secondary | ICD-10-CM

## 2015-02-07 DIAGNOSIS — Z91199 Patient's noncompliance with other medical treatment and regimen due to unspecified reason: Secondary | ICD-10-CM

## 2015-02-07 DIAGNOSIS — J439 Emphysema, unspecified: Secondary | ICD-10-CM

## 2015-02-07 DIAGNOSIS — Z9889 Other specified postprocedural states: Secondary | ICD-10-CM | POA: Diagnosis not present

## 2015-02-07 NOTE — Assessment & Plan Note (Signed)
For hx B PE and unabe anti-coag 2/2 pt non compliance

## 2015-02-07 NOTE — Assessment & Plan Note (Signed)
Hx BPE; not on coag 2/2 pt non compliance; pt has IVC filter

## 2015-02-07 NOTE — Assessment & Plan Note (Signed)
Barely controlled at all times. Followed by psych who is done such a good job on her multiple meds, all of which seem necessary.

## 2015-02-07 NOTE — Assessment & Plan Note (Signed)
Continue protonix 40 mg 

## 2015-02-07 NOTE — Assessment & Plan Note (Signed)
Pt has been without an aspiration PNA. So far so good.

## 2015-02-07 NOTE — Assessment & Plan Note (Signed)
In 12/2014 Hb 11.3/HCT 37.8. Have ordered iron studies and folate level.Has had low iron in past.

## 2015-02-07 NOTE — Progress Notes (Signed)
MRN: [id number] Name: [name]  Sex: female Age: 74 y.o. DOB: 02-11-1941  [place] #: [facility] Facility/Room: Level Of Care: SNF Provider: Inocencio Homes D Emergency Contacts: Extended Emergency Contact Information Primary Emergency Contact: Wright,[name] Address: [place], [place] [address] [place] Work Phone: [phone] Mobile Phone: [phone] Relation: Daughter Secondary Emergency Contact: Rachelle Hora [place] Phone: [phone] Relation: Sister  Code Status: FULL  Allergies: Benzodiazepines; Morphine and related; Olanzapine; and Sulfamethoxazole-trimethoprim  Chief Complaint  Patient presents with  . Medical Management of Chronic Issues    HPI: Patient is 74 y.o. female who is being seen for routine issues.  Past Medical History  Diagnosis Date  . Anxiety   . Arthritis   . COPD (chronic obstructive pulmonary disease)   . GERD (gastroesophageal reflux disease)   . Hyperlipidemia   . Osteoporosis   . DVT (deep venous thrombosis)   . PE (pulmonary thromboembolism)   . Hypertension      PT DENIES....ON NO  MEDS  . Diabetes mellitus   . Cancer 1969    cervical  . Pneumonia     h/o  . Bronchitis     h/o  . Blood transfusion   . Anemia   . Bipolar affect, depressed     Past Surgical History  Procedure Laterality Date  . Vena cava filter placement    . 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 Eye surgery      CATARACTS  . Achilles tendon lengthening  11/18/11    and repair w/posterior tibial tendon lengthening; right  foot  . Cholecystectomy    . Abdominal hysterectomy  1969    "womb taken out for cervical cancer"  . Cataract extraction, bilateral  ~ 2008  . Tonsillectomy      "as a child"  . Achilles tendon surgery  11/18/2011    Procedure: ACHILLES TENDON REPAIR;  Surgeon: Wylene Simmer, MD;  Location: [facility];  Service: Orthopedics;  Laterality: Right;  Right Posterior Tibial  Tendon Lenghtening and Tendon Achilles Lenghtening       Medication List       This list is accurate as of: 02/07/15  8:25 PM.  Always use your most recent med list.               acetaminophen 325 MG tablet  Commonly known as:  TYLENOL  Take 650 mg by mouth every 4 (four) hours as needed for mild pain.     ALPRAZolam 1 MG tablet  Commonly known as:  XANAX  Take 1 mg by mouth 2 (two) times daily. Take at 0600, 1000, 1400, and 1800.     alum & mag hydroxide-simeth 200-200-20 MG/5ML suspension  Commonly known as:  MAALOX/MYLANTA  Take 30 mLs by mouth every 6 (six) hours as needed for indigestion or heartburn.     antiseptic oral rinse Liqd  15 mLs by Mouth Rinse route every 4 (four) hours as needed for dry mouth.     aspirin 81 MG chewable tablet  Chew 81 mg by mouth daily.     bisacodyl 10 MG suppository  Commonly known as:  DULCOLAX  Place 10 mg rectally as directed. On Monday, Wednesday, and Friday and daily as needed if no bowel movement in 2 days.     CHLORASEPTIC 6-10 MG lozenge  Generic drug:  benzocaine-menthol  Take 1  lozenge by mouth every 2 (two) hours as needed for sore throat.     esomeprazole 40 MG capsule  Commonly known as:  NEXIUM  Take 40 mg by mouth daily at 12 noon.     fenofibrate 145 MG tablet  Commonly known as:  TRICOR  Take 145 mg by mouth daily.     fish oil-omega-3 fatty acids 1000 MG capsule  Take 1 g by mouth 2 (two) times daily.     Fluticasone-Salmeterol 250-50 MCG/DOSE Aepb  Commonly known as:  ADVAIR DISKUS  Inhale 1 puff into the lungs 2 (two) times daily.     gabapentin 300 MG capsule  Commonly known as:  NEURONTIN  Take 3 capsules (900 mg total) by mouth 3 (three) times daily.     HYDROcodone-acetaminophen 5-325 MG per tablet  Commonly known as:  NORCO/VICODIN  Take 1 tablet by mouth every 6 (six) hours as needed for moderate pain.     insulin aspart 100 UNIT/ML FlexPen  Commonly known as:  NOVOLOG  Inject 3-5 Units into  the skin 3 (three) times daily before meals. Give 3 units before meals with an additional 5 units for CBG> or < 150     insulin glargine 100 UNIT/ML injection  Commonly known as:  LANTUS  Inject 22 Units into the skin at bedtime.     lamoTRIgine 25 MG tablet  Commonly known as:  LAMICTAL  Take 150 mg by mouth daily.     levalbuterol 1.25 MG/0.5ML nebulizer solution  Commonly known as:  XOPENEX  Take 1.25 mg by nebulization 4 (four) times daily as needed for wheezing or shortness of breath.     levalbuterol 45 MCG/ACT inhaler  Commonly known as:  XOPENEX HFA  Inhale 2 puffs into the lungs every 4 (four) hours as needed for wheezing.     loperamide 2 MG capsule  Commonly known as:  IMODIUM  Take 2 mg by mouth as needed for diarrhea or loose stools.     loratadine 10 MG tablet  Commonly known as:  CLARITIN  Take 10 mg by mouth daily.     LORazepam 2 MG/ML concentrated solution  Commonly known as:  ATIVAN  Take 1 mg by mouth every 12 (twelve) hours as needed for anxiety (and agitation.).     LORazepam 2 MG/ML injection  Commonly known as:  ATIVAN  Inject 0.78ml intramuscular every 12 hours as needed for severe agitation     magnesium hydroxide 400 MG/5ML suspension  Commonly known as:  MILK OF MAGNESIA  Take 30 mLs by mouth daily as needed for mild constipation.     metFORMIN 1000 MG tablet  Commonly known as:  GLUCOPHAGE  Take 1,000 mg by mouth 2 (two) times daily with a meal.     metoprolol tartrate 25 MG tablet  Commonly known as:  LOPRESSOR  Take 25 mg by mouth 2 (two) times daily. At 0800 and 1700.     naproxen sodium 220 MG tablet  Commonly known as:  ANAPROX  Take 220 mg by mouth 2 (two) times daily with a meal.     OxyCODONE 10 mg T12a 12 hr tablet  Commonly known as:  OXYCONTIN  Take one tablet by mouth every 12 hours for pain. Do not crush     OXYGEN  Inhale 2 L into the lungs continuous. Via nasal cannula.     polyethylene glycol packet  Commonly known  as:  MIRALAX / GLYCOLAX  Take 17 g by mouth  daily.     polyvinyl alcohol 1.4 % ophthalmic solution  Commonly known as:  LIQUIFILM TEARS  Place 2 drops into both eyes 4 (four) times daily as needed for dry eyes.     potassium chloride 10 MEQ tablet  Commonly known as:  K-DUR,KLOR-CON  Take 5 mEq by mouth every evening.     promethazine 25 MG tablet  Commonly known as:  PHENERGAN  Take 25 mg by mouth every 6 (six) hours as needed. nausea     QUEtiapine 300 MG tablet  Commonly known as:  SEROQUEL  Take 100 mg by mouth at bedtime.     REFRESH LACRI-LUBE Oint  Place 1 application into both eyes at bedtime.     risperiDONE 2 MG tablet  Commonly known as:  RISPERDAL  Take 2 mg by mouth 2 (two) times daily.     simvastatin 10 MG tablet  Commonly known as:  ZOCOR  Take 10 mg by mouth daily at 6 PM.     sodium phosphate enema  Commonly known as:  FLEET  Place 1 enema rectally daily as needed (constipation). follow package directions     SYSTANE 0.4-0.3 % Soln  Generic drug:  Polyethyl Glycol-Propyl Glycol  Place 1 drop into both eyes 4 (four) times daily.     tiotropium 18 MCG inhalation capsule  Commonly known as:  SPIRIVA  Place 1 capsule (18 mcg total) into inhaler and inhale daily.     torsemide 20 MG tablet  Commonly known as:  DEMADEX  Take 40 mg by mouth daily.     traZODone 150 MG tablet  Commonly known as:  DESYREL  Take 150 mg by mouth at bedtime.     trimethoprim 100 MG tablet  Commonly known as:  TRIMPEX  Take 100 mg by mouth daily.     UTI-STAT Liqd  Take 30 mLs by mouth daily.     venlafaxine XR 150 MG 24 hr capsule  Commonly known as:  EFFEXOR-XR  Take 300 mg by mouth every morning.        No orders of the defined types were placed in this encounter.    Immunization History  Administered Date(s) Administered  . Influenza Split 10/20/2011  . Influenza Whole 10/05/2007, 09/16/2008, 11/17/2010, 10/01/2013  . Influenza,inj,Quad PF,36+ Mos  09/02/2014  . Pneumococcal Polysaccharide-23 10/21/2003, 11/17/2010, 01/27/2012  . Td 09/20/2003    History  Substance Use Topics  . Smoking status: Former Smoker -- 1.00 packs/day    Types: Cigarettes    Quit date: 11/14/1986  . Smokeless tobacco: Never Used     Comment: pt cannot recall the total amt of time she smoked.  . Alcohol Use: No    Review of Systems  DATA OBTAINED: from patient, nurse GENERAL:  no fevers, fatigue, appetite changes SKIN: intertrigo HEENT: No complaint RESPIRATORY: No cough, wheezing, SOB CARDIAC: No chest pain, palpitations, lower extremity edema  GI: No abdominal pain, No N/V/D or constipation, No heartburn or reflux  GU: No dysuria, frequency or urgency, or incontinence  MUSCULOSKELETAL: No unrelieved bone/joint pain NEUROLOGIC: No headache, dizziness  PSYCHIATRIC: yelling out all day  Filed Vitals:   02/07/15 1614  BP: 150/83  Pulse: 96  Temp: 97.2 F (36.2 C)  Resp: 18    Physical Exam  GENERAL APPEARANCE: Alert, conversant, No acute distress  SKIN: intertrigo B breasts and L groin GU looks pink and healing, very little under breasts considering her obesity HEENT: Unremarkable RESPIRATORY: Breathing is even, unlabored. Lung  sounds are diffusely decreased ,no wheezing  CARDIOVASCULAR: Heart RRR no murmurs, rubs or gallops. No peripheral edema  GASTROINTESTINAL: Abdomen is soft, non-tender, not distended w/ normal bowel sounds.  GENITOURINARY: Bladder non tender, not distended  MUSCULOSKELETAL: No abnormal joints or musculature NEUROLOGIC: Cranial nerves 2-12 grossly intact. Moves all extremities PSYCHIATRIC: very loud and petulant today, screaming in the hall "what about my breasts? what about my breasts? What about my breasts" over and over and over  Patient Active Problem List   Diagnosis Date Noted  . Candidiasis, intertrigo 02/07/2015  . Lethargy 10/17/2014  . Sore throat 10/08/2014  . Chest congestion 10/08/2014  .  Dysphagia, oropharyngeal 09/28/2014  . Neuropathy due to secondary diabetes 09/28/2014  . Esophageal dysphagia 08/09/2014  . Non compliance with medical treatment 08/09/2014  . MRSA cellulitis 02/20/2014  . Tardive dyskinesia 02/10/2014  . Infection due to ESBL-producing Escherichia coli 02/02/2014  . Sepsis 01/21/2014  . Acute respiratory failure with hypoxia 01/21/2014  . Fever, unspecified 01/21/2014  . Septic shock 12/25/2013  . UTI 12/25/2013  . Acute encephalopathy 12/25/2013  . Pneumonia, organism unspecified 12/21/2013  . Cough 12/21/2013  . Diarrhea 12/21/2013  . Diaper candidiasis 12/17/2013  . Dysuria 09/17/2013  . Pure hyperglyceridemia 06/18/2013  . Type 2 diabetes mellitus with neurological manifestations, uncontrolled 05/04/2013  . Chronic pain syndrome 05/04/2013  . Constipation 04/04/2013  . Venous stasis dermatitis 04/03/2012  . COPD exacerbation 02/21/2012  . Tachycardia 12/27/2011  . Thrush 11/10/2011  . Ankle arthritis 07/28/2011  . Chronic right shoulder pain 06/03/2011  . Leg swelling 03/03/2011  . INSOMNIA, CHRONIC 11/17/2010  . Other chronic pain 11/17/2010  . Presence of IVC filter 11/17/2010  . NEOPLASM UNCERTAIN BEHAVIOR MAJOR SALIV GLANDS 11/10/2009  . Personal history of PE (pulmonary embolism) 10/02/2009  . DEEP VENOUS THROMBOPHLEBITIS, LEG, LEFT 10/02/2009  . Pulmonary nodules 09/12/2009  . Wheelchair dependence 04/10/2009  . COR PULMONALE 05/22/2008  . VENOUS INSUFFICIENCY, CHRONIC 03/25/2008  . SYMPTOM, INCONTINENCE, URINARY NOS 07/13/2007  . Diabetes type 2, controlled 05/11/2007  . Anemia 05/11/2007  . GERD 05/11/2007  . Hyperlipidemia LDL goal <70 02/16/2007  . Obesity, diabetes, and hypertension syndrome 02/16/2007  . BIPOLAR DISORDER 02/16/2007  . ANXIETY 02/16/2007  . HYPERTENSION, BENIGN SYSTEMIC 02/16/2007  . COPD (chronic obstructive pulmonary disease) with emphysema 02/16/2007  . OSTEOPENIA 02/16/2007  . TOBACCO ABUSE, HX  OF 02/16/2007    CBC    Component Value Date/Time   WBC 8.4 09/12/2014 1558   RBC 4.27 09/12/2014 1558   HGB 12.1 09/12/2014 1558   HCT 38.5 09/12/2014 1558   PLT 226 09/12/2014 1558   MCV 90.2 09/12/2014 1558   LYMPHSABS 2.9 02/05/2014 0347   MONOABS 0.6 02/05/2014 0347   EOSABS 0.2 02/05/2014 0347   BASOSABS 0.1 02/05/2014 0347    CMP     Component Value Date/Time   NA 136* 09/12/2014 1558   K 4.6 09/12/2014 1558   CL 95* 09/12/2014 1558   CO2 26 09/12/2014 1558   GLUCOSE 207* 09/12/2014 1558   BUN 9 09/12/2014 1558   CREATININE 0.59 09/12/2014 1558   CREATININE 0.73 03/24/2011 1614   CALCIUM 9.4 09/12/2014 1558   PROT 7.1 01/21/2014 1139   ALBUMIN 3.2* 01/21/2014 1139   AST 30 01/21/2014 1139   ALT 14 01/21/2014 1139   ALKPHOS 57 01/21/2014 1139   BILITOT 0.2* 01/21/2014 1139   GFRNONAA 89* 09/12/2014 1558   GFRAA >90 09/12/2014 1558    Assessment and Plan  HYPERTENSION, BENIGN SYSTEMIC Controlled on lopressor and demadex;Plan - continue both meds   Personal history of PE (pulmonary embolism) Hx BPE; not on coag 2/2 pt non compliance; pt has IVC filter   Presence of IVC filter For hx B PE and unabe anti-coag 2/2 pt non compliance   Dysphagia, oropharyngeal Pt has been without an aspiration PNA. So far so good.   Diabetes type 2, controlled 01/2015 A1c was 7.8 which is very good for this pt. In her room today I noted pt has 3 packages of snacks next to her bed, one was an icing covered pastry thing.   Candidiasis, intertrigo Pt has been getting cream under B breasts and L groin/GU area and though she c/o bitterly all areas look very very good.   Non compliance with medical treatment Pt continues to act out. She does everything she wants to do regardless of health consequences.   Hyperlipidemia LDL goal <70 Pt is on fenofibrate and zocor. She does not have a recent FLP, one has been ordered   Anemia In 12/2014 Hb 11.3/HCT 37.8. Have ordered  iron studies and folate level.Has had low iron in past.   GERD Continue protonix 40 mg   COPD (chronic obstructive pulmonary disease) with emphysema Pt wears O2 all the time. Considering how severe her disease is she has has very little trouble with cough and wheezing   BIPOLAR DISORDER Barely controlled at all times. Followed by psych who is done such a good job on her multiple meds, all of which seem necessary.     Hennie Duos, MD

## 2015-02-07 NOTE — Assessment & Plan Note (Signed)
Pt is on fenofibrate and zocor. She does not have a recent FLP, one has been ordered

## 2015-02-07 NOTE — Assessment & Plan Note (Signed)
Pt wears O2 all the time. Considering how severe her disease is she has has very little trouble with cough and wheezing

## 2015-02-07 NOTE — Assessment & Plan Note (Signed)
01/2015 A1c was 7.8 which is very good for this pt. In her room today I noted pt has 3 packages of snacks next to her bed, one was an icing covered pastry thing.

## 2015-02-07 NOTE — Assessment & Plan Note (Signed)
Pt has been getting cream under B breasts and L groin/GU area and though she c/o bitterly all areas look very very good.

## 2015-02-07 NOTE — Assessment & Plan Note (Signed)
Controlled on lopressor and demadex;Plan - continue both meds

## 2015-02-07 NOTE — Assessment & Plan Note (Signed)
Pt continues to act out. She does everything she wants to do regardless of health consequences.

## 2015-02-19 ENCOUNTER — Other Ambulatory Visit: Payer: Self-pay

## 2015-02-19 MED ORDER — OXYCODONE HCL ER 10 MG PO T12A: EXTENDED_RELEASE_TABLET | ORAL | Status: DC

## 2015-02-19 NOTE — Telephone Encounter (Signed)
Faxed to [facility] Fax Number: [phone], Phone Number [phone]  

## 2015-03-03 ENCOUNTER — Ambulatory Visit: Payer: Self-pay | Admitting: Pulmonary Disease

## 2015-03-04 ENCOUNTER — Non-Acute Institutional Stay (SKILLED_NURSING_FACILITY): Payer: Medicare Other | Admitting: Internal Medicine

## 2015-03-04 ENCOUNTER — Encounter: Payer: Self-pay | Admitting: Internal Medicine

## 2015-03-04 DIAGNOSIS — G4700 Insomnia, unspecified: Secondary | ICD-10-CM | POA: Diagnosis not present

## 2015-03-04 DIAGNOSIS — F3130 Bipolar disorder, current episode depressed, mild or moderate severity, unspecified: Secondary | ICD-10-CM | POA: Diagnosis not present

## 2015-03-04 DIAGNOSIS — L22 Diaper dermatitis: Secondary | ICD-10-CM

## 2015-03-04 DIAGNOSIS — F29 Unspecified psychosis not due to a substance or known physiological condition: Secondary | ICD-10-CM | POA: Diagnosis not present

## 2015-03-04 DIAGNOSIS — F319 Bipolar disorder, unspecified: Secondary | ICD-10-CM

## 2015-03-04 DIAGNOSIS — W19XXXA Unspecified fall, initial encounter: Secondary | ICD-10-CM

## 2015-03-04 DIAGNOSIS — F419 Anxiety disorder, unspecified: Secondary | ICD-10-CM | POA: Diagnosis not present

## 2015-03-04 DIAGNOSIS — B372 Candidiasis of skin and nail: Secondary | ICD-10-CM

## 2015-03-04 DIAGNOSIS — F418 Other specified anxiety disorders: Secondary | ICD-10-CM

## 2015-03-04 NOTE — Progress Notes (Signed)
MRN: [id number] Name: [name]  Sex: female Age: 74 y.o. DOB: 1941/12/17  [place] #: [facility] Facility/Room: 217W Level Of Care: SNF Provider: Inocencio Homes D Emergency Contacts: Extended Emergency Contact Information Primary Emergency Contact: Wright,[name] Address: [place], [place] [address] [place] Work Phone: [phone] Mobile Phone: [phone] Relation: Daughter Secondary Emergency Contact: Rachelle Hora [place] Phone: [phone] Relation: Sister  Code StatusFULL:   Allergies: Benzodiazepines; Morphine and related; Olanzapine; and Sulfamethoxazole-trimethoprim  Chief Complaint  Patient presents with  . Acute Visit  . Medical Management of Chronic Issues    HPI: Patient is 74 y.o. female who is being seen for a fall in the bathroom.  Past Medical History  Diagnosis Date  . Anxiety   . Arthritis   . COPD (chronic obstructive pulmonary disease)   . GERD (gastroesophageal reflux disease)   . Hyperlipidemia   . Osteoporosis   . DVT (deep venous thrombosis)   . PE (pulmonary thromboembolism)   . Hypertension      PT DENIES....ON NO  MEDS  . Diabetes mellitus   . Cancer 1969    cervical  . Pneumonia     h/o  . Bronchitis     h/o  . Blood transfusion   . Anemia   . Bipolar affect, depressed     Past Surgical History  Procedure Laterality Date  . Vena cava filter placement    . 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 Eye surgery      CATARACTS  . Achilles tendon lengthening  11/18/11    and repair w/posterior tibial tendon lengthening; right  foot  . Cholecystectomy    . Abdominal hysterectomy  1969    "womb taken out for cervical cancer"  . Cataract extraction, bilateral  ~ 2008  . Tonsillectomy      "as a child"  . Achilles tendon surgery  11/18/2011    Procedure: ACHILLES TENDON REPAIR;  Surgeon: Wylene Simmer, MD;  Location: [facility];  Service: Orthopedics;  Laterality:  Right;  Right Posterior Tibial Tendon Lenghtening and Tendon Achilles Lenghtening       Medication List       This list is accurate as of: 03/04/15  8:55 PM.  Always use your most recent med list.               acetaminophen 325 MG tablet  Commonly known as:  TYLENOL  Take 650 mg by mouth every 4 (four) hours as needed for mild pain.     ALPRAZolam 1 MG tablet  Commonly known as:  XANAX  Take 1 mg by mouth 2 (two) times daily. Take at 0600, 1000, 1400, and 1800.     alum & mag hydroxide-simeth 200-200-20 MG/5ML suspension  Commonly known as:  MAALOX/MYLANTA  Take 30 mLs by mouth every 6 (six) hours as needed for indigestion or heartburn.     antiseptic oral rinse Liqd  15 mLs by Mouth Rinse route every 4 (four) hours as needed for dry mouth.     aspirin 81 MG chewable tablet  Chew 81 mg by mouth daily.     bisacodyl 10 MG suppository  Commonly known as:  DULCOLAX  Place 10 mg rectally as directed. On Monday, Wednesday, and Friday and daily as needed if no bowel movement in 2 days.     CHLORASEPTIC 6-10 MG lozenge  Generic drug:  benzocaine-menthol  Take 1 lozenge by mouth every 2 (two) hours as needed for sore throat.     esomeprazole 40 MG capsule  Commonly known as:  NEXIUM  Take 40 mg by mouth daily at 12 noon.     fenofibrate 145 MG tablet  Commonly known as:  TRICOR  Take 145 mg by mouth daily.     fish oil-omega-3 fatty acids 1000 MG capsule  Take 1 g by mouth 2 (two) times daily.     Fluticasone-Salmeterol 250-50 MCG/DOSE Aepb  Commonly known as:  ADVAIR DISKUS  Inhale 1 puff into the lungs 2 (two) times daily.     gabapentin 300 MG capsule  Commonly known as:  NEURONTIN  Take 3 capsules (900 mg total) by mouth 3 (three) times daily.     HYDROcodone-acetaminophen 5-325 MG per tablet  Commonly known as:  NORCO/VICODIN  Take 1 tablet by mouth every 6 (six) hours as needed for moderate pain.     insulin aspart 100 UNIT/ML FlexPen  Commonly known as:   NOVOLOG  Inject 3-5 Units into the skin 3 (three) times daily before meals. Give 3 units before meals with an additional 5 units for CBG> or < 150     insulin glargine 100 UNIT/ML injection  Commonly known as:  LANTUS  Inject 22 Units into the skin at bedtime.     lamoTRIgine 25 MG tablet  Commonly known as:  LAMICTAL  Take 150 mg by mouth daily.     levalbuterol 1.25 MG/0.5ML nebulizer solution  Commonly known as:  XOPENEX  Take 1.25 mg by nebulization 4 (four) times daily as needed for wheezing or shortness of breath.     levalbuterol 45 MCG/ACT inhaler  Commonly known as:  XOPENEX HFA  Inhale 2 puffs into the lungs every 4 (four) hours as needed for wheezing.     loperamide 2 MG capsule  Commonly known as:  IMODIUM  Take 2 mg by mouth as needed for diarrhea or loose stools.     loratadine 10 MG tablet  Commonly known as:  CLARITIN  Take 10 mg by mouth daily.     LORazepam 2 MG/ML concentrated solution  Commonly known as:  ATIVAN  Take 1 mg by mouth every 12 (twelve) hours as needed for anxiety (and agitation.).     LORazepam 2 MG/ML injection  Commonly known as:  ATIVAN  Inject 0.46ml intramuscular every 12 hours as needed for severe agitation     magnesium hydroxide 400 MG/5ML suspension  Commonly known as:  MILK OF MAGNESIA  Take 30 mLs by mouth daily as needed for mild constipation.     metFORMIN 1000 MG tablet  Commonly known as:  GLUCOPHAGE  Take 1,000 mg by mouth 2 (two) times daily with a meal.     metoprolol tartrate 25 MG tablet  Commonly known as:  LOPRESSOR  Take 25 mg by mouth 2 (two) times daily. At 0800 and 1700.     naproxen sodium 220 MG tablet  Commonly known as:  ANAPROX  Take 220 mg by mouth 2 (two) times daily with a meal.     OxyCODONE 10 mg T12a 12 hr tablet  Commonly known as:  OXYCONTIN  Take one tablet by mouth every 12 hours for pain. Do not crush     OXYGEN  Inhale 2 L into the lungs continuous. Via nasal cannula.     polyethylene  glycol packet  Commonly known as:  MIRALAX /  GLYCOLAX  Take 17 g by mouth daily.     polyvinyl alcohol 1.4 % ophthalmic solution  Commonly known as:  LIQUIFILM TEARS  Place 2 drops into both eyes 4 (four) times daily as needed for dry eyes.     potassium chloride 10 MEQ tablet  Commonly known as:  K-DUR,KLOR-CON  Take 5 mEq by mouth every evening.     promethazine 25 MG tablet  Commonly known as:  PHENERGAN  Take 25 mg by mouth every 6 (six) hours as needed. nausea     QUEtiapine 300 MG tablet  Commonly known as:  SEROQUEL  Take 100 mg by mouth at bedtime.     REFRESH LACRI-LUBE Oint  Place 1 application into both eyes at bedtime.     risperiDONE 2 MG tablet  Commonly known as:  RISPERDAL  Take 2 mg by mouth 2 (two) times daily.     simvastatin 10 MG tablet  Commonly known as:  ZOCOR  Take 10 mg by mouth daily at 6 PM.     sodium phosphate enema  Commonly known as:  FLEET  Place 1 enema rectally daily as needed (constipation). follow package directions     SYSTANE 0.4-0.3 % Soln  Generic drug:  Polyethyl Glycol-Propyl Glycol  Place 1 drop into both eyes 4 (four) times daily.     tiotropium 18 MCG inhalation capsule  Commonly known as:  SPIRIVA  Place 1 capsule (18 mcg total) into inhaler and inhale daily.     torsemide 20 MG tablet  Commonly known as:  DEMADEX  Take 40 mg by mouth daily.     traZODone 150 MG tablet  Commonly known as:  DESYREL  Take 150 mg by mouth at bedtime.     trimethoprim 100 MG tablet  Commonly known as:  TRIMPEX  Take 100 mg by mouth daily.     UTI-STAT Liqd  Take 30 mLs by mouth daily.     venlafaxine XR 150 MG 24 hr capsule  Commonly known as:  EFFEXOR-XR  Take 300 mg by mouth every morning.        No orders of the defined types were placed in this encounter.    Immunization History  Administered Date(s) Administered  . Influenza Split 10/20/2011  . Influenza Whole 10/05/2007, 09/16/2008, 11/17/2010, 10/01/2013  .  Influenza,inj,Quad PF,36+ Mos 09/02/2014  . Pneumococcal Polysaccharide-23 10/21/2003, 11/17/2010, 01/27/2012  . Td 09/20/2003    History  Substance Use Topics  . Smoking status: Former Smoker -- 1.00 packs/day    Types: Cigarettes    Quit date: 11/14/1986  . Smokeless tobacco: Never Used     Comment: pt cannot recall the total amt of time she smoked.  . Alcohol Use: No    Review of Systems  DATA OBTAINED: from patient, nurse -pt has been more impatient and demanding than usual; she has fallen because she won't wait; she screams most days in the main hall, today more and louder than usual; per nursing fell at toilet in her room;per pt she was trying to turn on the toilet and fell GENERAL:  no fevers, fatigue, appetite changes SKIN: No itching, rash HEENT: No complaint RESPIRATORY: No cough, wheezing, SOB CARDIAC: No chest pain, palpitations, lower extremity edema  GI: No abdominal pain, No N/V/D or constipation, No heartburn or reflux  GU: No dysuria, frequency or urgency, or incontinence  MUSCULOSKELETAL: admits low back pain then acknowledges she has it all the time NEUROLOGIC: No headache, dizziness  PSYCHIATRIC: +anxiety  Filed Vitals:   03/04/15 1635  BP: 134/81  Pulse: 98  Temp: 97.5 F (36.4 C)  Resp: 18    Physical Exam  GENERAL APPEARANCE: Alert, conversant, lying in floor on back with feet at toilet half in, half out of the bathroom SKIN: no bruising or abrasions, no swelling or tender toscalp HEENT: Unremarkable RESPIRATORY: Breathing is even, unlabored. Lung sounds are clear   CARDIOVASCULAR: Heart RRR no murmurs, rubs or gallops. trace peripheral edema  GASTROINTESTINAL: Abdomen is soft, non-tender, not distended w/ normal bowel sounds.  GENITOURINARY: Bladder non tender, not distended  MUSCULOSKELETAL: No abnormal joints or musculature NEUROLOGIC: Cranial nerves 2-12 grossly intact. Moves all extremities PSYCHIATRIC: loud aggitated behavoirs  Patient  Active Problem List   Diagnosis Date Noted  . Psychosis 03/04/2015  . Bipolar depression 03/04/2015  . Candidiasis, intertrigo 02/07/2015  . Lethargy 10/17/2014  . Sore throat 10/08/2014  . Chest congestion 10/08/2014  . Dysphagia, oropharyngeal 09/28/2014  . Neuropathy due to secondary diabetes 09/28/2014  . Esophageal dysphagia 08/09/2014  . Non compliance with medical treatment 08/09/2014  . MRSA cellulitis 02/20/2014  . Tardive dyskinesia 02/10/2014  . Infection due to ESBL-producing Escherichia coli 02/02/2014  . Sepsis 01/21/2014  . Acute respiratory failure with hypoxia 01/21/2014  . Fever, unspecified 01/21/2014  . Septic shock 12/25/2013  . UTI 12/25/2013  . Acute encephalopathy 12/25/2013  . Pneumonia, organism unspecified 12/21/2013  . Cough 12/21/2013  . Diarrhea 12/21/2013  . Diaper candidiasis 12/17/2013  . Dysuria 09/17/2013  . Pure hyperglyceridemia 06/18/2013  . Type 2 diabetes mellitus with neurological manifestations, uncontrolled 05/04/2013  . Chronic pain syndrome 05/04/2013  . Constipation 04/04/2013  . Venous stasis dermatitis 04/03/2012  . COPD exacerbation 02/21/2012  . Tachycardia 12/27/2011  . Thrush 11/10/2011  . Ankle arthritis 07/28/2011  . Chronic right shoulder pain 06/03/2011  . Leg swelling 03/03/2011  . INSOMNIA, CHRONIC 11/17/2010  . Other chronic pain 11/17/2010  . Presence of IVC filter 11/17/2010  . NEOPLASM UNCERTAIN BEHAVIOR MAJOR SALIV GLANDS 11/10/2009  . Personal history of PE (pulmonary embolism) 10/02/2009  . DEEP VENOUS THROMBOPHLEBITIS, LEG, LEFT 10/02/2009  . Pulmonary nodules 09/12/2009  . Wheelchair dependence 04/10/2009  . COR PULMONALE 05/22/2008  . VENOUS INSUFFICIENCY, CHRONIC 03/25/2008  . SYMPTOM, INCONTINENCE, URINARY NOS 07/13/2007  . Diabetes type 2, controlled 05/11/2007  . Anemia 05/11/2007  . GERD 05/11/2007  . Hyperlipidemia LDL goal <70 02/16/2007  . Obesity, diabetes, and hypertension syndrome  02/16/2007  . BIPOLAR DISORDER 02/16/2007  . Anxiety with obsessional features 02/16/2007  . HYPERTENSION, BENIGN SYSTEMIC 02/16/2007  . COPD (chronic obstructive pulmonary disease) with emphysema 02/16/2007  . OSTEOPENIA 02/16/2007  . TOBACCO ABUSE, HX OF 02/16/2007    CBC    Component Value Date/Time   WBC 8.4 09/12/2014 1558   RBC 4.27 09/12/2014 1558   HGB 12.1 09/12/2014 1558   HCT 38.5 09/12/2014 1558   PLT 226 09/12/2014 1558   MCV 90.2 09/12/2014 1558   LYMPHSABS 2.9 02/05/2014 0347   MONOABS 0.6 02/05/2014 0347   EOSABS 0.2 02/05/2014 0347   BASOSABS 0.1 02/05/2014 0347    CMP     Component Value Date/Time   NA 136* 09/12/2014 1558   K 4.6 09/12/2014 1558   CL 95* 09/12/2014 1558   CO2 26 09/12/2014 1558   GLUCOSE 207* 09/12/2014 1558   BUN 9 09/12/2014 1558   CREATININE 0.59 09/12/2014 1558   CREATININE 0.73 03/24/2011 1614   CALCIUM 9.4 09/12/2014 1558   PROT  7.1 01/21/2014 1139   ALBUMIN 3.2* 01/21/2014 1139   AST 30 01/21/2014 1139   ALT 14 01/21/2014 1139   ALKPHOS 57 01/21/2014 1139   BILITOT 0.2* 01/21/2014 1139   GFRNONAA 89* 09/12/2014 1558   GFRAA >90 09/12/2014 1558    Assessment and Plan  FALL AT TOILET- pt appears to be uninjured. I think it likely that pt at most fell on her bottom. Will continue to monitor which won't be hard because she makes noise wherever she goes.  Psychosis Paranoia lamictal seroquell risperdal   BIPOLAR DISORDER Lamictal, seroquel,risperdal   Anxiety with obsessional features Ativan IM and xanax QID   INSOMNIA, CHRONIC Trazodone 150 mg   Bipolar depression effexor XR 300 mg     Hennie Duos, MD

## 2015-03-04 NOTE — Assessment & Plan Note (Addendum)
Pt obsesses over her daily schedule ,everything has to be done NOW and on the hour, her yogurt cup, going to lunch to room, everything. Ativan IM and xanax QID to control.

## 2015-03-04 NOTE — Assessment & Plan Note (Addendum)
Pt is on effexor XR 300 mg per psych;Plan - continue effexor

## 2015-03-04 NOTE — Assessment & Plan Note (Signed)
Trazodone 150 mg

## 2015-03-04 NOTE — Assessment & Plan Note (Addendum)
Paranoia about others is present and constant, waxing and waning in severity;a finely crafted blend of  lamictal ,seroquell and risperdal has kept her failry stable, manageable with small exacerbations on and off. Pt has IM ativan if it gets too severe.

## 2015-03-04 NOTE — Assessment & Plan Note (Addendum)
One of the cornerstone diagnoses that guides pt's life and problems. Pt is on blend of Lamictal, seroquel,and risperdal which has kept her in decent control. Ativan IM for exacerbations of aggitation.

## 2015-03-04 NOTE — Assessment & Plan Note (Signed)
Nystatin to Gu area , same as breasts and abdominal folds.

## 2015-03-04 NOTE — Assessment & Plan Note (Signed)
Chronic and stable under breasts and abdomen pannis but looking very good, continues on nystatin cream.

## 2015-03-16 ENCOUNTER — Emergency Department (HOSPITAL_COMMUNITY)
Admission: EM | Admit: 2015-03-16 | Discharge: 2015-03-16 | Disposition: A | Discharge status: Home / Self Care | Payer: Medicare Other | Attending: Emergency Medicine | Admitting: Emergency Medicine

## 2015-03-16 ENCOUNTER — Encounter (HOSPITAL_COMMUNITY): Payer: Self-pay | Admitting: *Deleted

## 2015-03-16 DIAGNOSIS — I82402 Acute embolism and thrombosis of unspecified deep veins of left lower extremity: Secondary | ICD-10-CM | POA: Diagnosis not present

## 2015-03-16 DIAGNOSIS — K219 Gastro-esophageal reflux disease without esophagitis: Secondary | ICD-10-CM | POA: Diagnosis not present

## 2015-03-16 DIAGNOSIS — Z86711 Personal history of pulmonary embolism: Secondary | ICD-10-CM | POA: Insufficient documentation

## 2015-03-16 DIAGNOSIS — Z79899 Other long term (current) drug therapy: Secondary | ICD-10-CM | POA: Diagnosis not present

## 2015-03-16 DIAGNOSIS — Z794 Long term (current) use of insulin: Secondary | ICD-10-CM | POA: Insufficient documentation

## 2015-03-16 DIAGNOSIS — M1990 Unspecified osteoarthritis, unspecified site: Secondary | ICD-10-CM | POA: Insufficient documentation

## 2015-03-16 DIAGNOSIS — Z87891 Personal history of nicotine dependence: Secondary | ICD-10-CM | POA: Diagnosis not present

## 2015-03-16 DIAGNOSIS — F419 Anxiety disorder, unspecified: Secondary | ICD-10-CM | POA: Insufficient documentation

## 2015-03-16 DIAGNOSIS — F319 Bipolar disorder, unspecified: Secondary | ICD-10-CM | POA: Insufficient documentation

## 2015-03-16 DIAGNOSIS — E785 Hyperlipidemia, unspecified: Secondary | ICD-10-CM | POA: Diagnosis not present

## 2015-03-16 DIAGNOSIS — Z862 Personal history of diseases of the blood and blood-forming organs and certain disorders involving the immune mechanism: Secondary | ICD-10-CM | POA: Diagnosis not present

## 2015-03-16 DIAGNOSIS — Z8541 Personal history of malignant neoplasm of cervix uteri: Secondary | ICD-10-CM | POA: Diagnosis not present

## 2015-03-16 DIAGNOSIS — E119 Type 2 diabetes mellitus without complications: Secondary | ICD-10-CM | POA: Diagnosis not present

## 2015-03-16 DIAGNOSIS — J449 Chronic obstructive pulmonary disease, unspecified: Secondary | ICD-10-CM | POA: Diagnosis not present

## 2015-03-16 DIAGNOSIS — I10 Essential (primary) hypertension: Secondary | ICD-10-CM | POA: Insufficient documentation

## 2015-03-16 DIAGNOSIS — M79605 Pain in left leg: Secondary | ICD-10-CM | POA: Diagnosis present

## 2015-03-16 LAB — CBC WITH DIFFERENTIAL/PLATELET
BASOS PCT: 0 % (ref 0–1)
Basophils Absolute: 0 10*3/uL (ref 0.0–0.1)
Eosinophils Absolute: 0.3 10*3/uL (ref 0.0–0.7)
Eosinophils Relative: 5 % (ref 0–5)
HCT: 35.8 % — ABNORMAL LOW (ref 36.0–46.0)
Hemoglobin: 10.9 g/dL — ABNORMAL LOW (ref 12.0–15.0)
Lymphocytes Relative: 29 % (ref 12–46)
Lymphs Abs: 1.8 10*3/uL (ref 0.7–4.0)
MCH: 28.1 pg (ref 26.0–34.0)
MCHC: 30.4 g/dL (ref 30.0–36.0)
MCV: 92.3 fL (ref 78.0–100.0)
Monocytes Absolute: 0.5 10*3/uL (ref 0.1–1.0)
Monocytes Relative: 8 % (ref 3–12)
Neutro Abs: 3.5 10*3/uL (ref 1.7–7.7)
Neutrophils Relative %: 58 % (ref 43–77)
Platelets: ADEQUATE 10*3/uL (ref 150–400)
RBC: 3.88 MIL/uL (ref 3.87–5.11)
RDW: 15.1 % (ref 11.5–15.5)
WBC: 6.1 10*3/uL (ref 4.0–10.5)

## 2015-03-16 LAB — COMPREHENSIVE METABOLIC PANEL
ALT: 17 U/L (ref 0–35)
AST: 21 U/L (ref 0–37)
Albumin: 3.7 g/dL (ref 3.5–5.2)
Alkaline Phosphatase: 55 U/L (ref 39–117)
Anion gap: 9 (ref 5–15)
BILIRUBIN TOTAL: 0.5 mg/dL (ref 0.3–1.2)
BUN: 13 mg/dL (ref 6–23)
CO2: 35 mmol/L — AB (ref 19–32)
Calcium: 8.7 mg/dL (ref 8.4–10.5)
Chloride: 92 mmol/L — ABNORMAL LOW (ref 96–112)
Creatinine, Ser: 0.71 mg/dL (ref 0.50–1.10)
GFR calc Af Amer: >90 mL/min (ref >90)
GFR calc non Af Amer: 84 mL/min — ABNORMAL LOW (ref >90)
GLUCOSE: 267 mg/dL — AB (ref 70–99)
POTASSIUM: 3.9 mmol/L (ref 3.5–5.1)
SODIUM: 136 mmol/L (ref 135–145)
TOTAL PROTEIN: 7.6 g/dL (ref 6.0–8.3)

## 2015-03-16 LAB — BRAIN NATRIURETIC PEPTIDE: B Natriuretic Peptide: 23.6 pg/mL (ref 0.0–100.0)

## 2015-03-16 MED ORDER — APIXABAN 5 MG PO TABS: 10.0000 mg | ORAL_TABLET | Freq: Two times a day (BID) | ORAL | Status: AC

## 2015-03-16 MED ORDER — APIXABAN 5 MG PO TABS
10.0000 mg | ORAL_TABLET | Freq: Once | ORAL | Status: AC
  Administered 2015-03-16: 10 mg via ORAL
  Filled 2015-03-16: qty 2

## 2015-03-16 MED ORDER — OXYCODONE-ACETAMINOPHEN 5-325 MG PO TABS
2.0000 | ORAL_TABLET | Freq: Once | ORAL | Status: AC
  Administered 2015-03-16: 2 via ORAL
  Filled 2015-03-16: qty 2

## 2015-03-16 MED ORDER — ALPRAZOLAM 0.5 MG PO TABS
1.0000 mg | ORAL_TABLET | Freq: Once | ORAL | Status: AC
  Administered 2015-03-16: 1 mg via ORAL
  Filled 2015-03-16: qty 2

## 2015-03-16 NOTE — Discharge Instructions (Signed)
You have a DVT in your left leg.  This can be painful and may make it difficult for you to do Physical therapy.  It is recommended that you do not do anything that causes you significant pain.  However, you can continue Physical Therapy as tolerated.  You also were noted to have a pressure ulcer developing of the sacral area.  It is recommended that you change positions every 2 hours.

## 2015-03-16 NOTE — ED Notes (Addendum)
Awake. Verbally responsive. A/O x4. Resp even and unlabored. No audible adventitious breath sounds noted. ABC's intact. Pt reported having lt leg pain with swelling and redness noted. Pt has redness noted to rt leg. BLE warm to touch. Family at bedside. ED tech at bedside to obtain blood specimen. Pt reported buttock pain and noted 2 small areas discolored areas to rt buttocks approx 2cm x2cm. Pt turned to rt lateral position.

## 2015-03-16 NOTE — ED Notes (Signed)
Per [facility] report: pt from [facility].  Pt presents with left leg pain and swelling since this morning.  Pt is able to stand and pivot. Pt hx of blood clots and recently got a blood filter.  Pt does not take blood thinners.

## 2015-03-16 NOTE — ED Notes (Signed)
Bed: WA21 Expected date:  Expected time:  Means of arrival:  Comments: 

## 2015-03-16 NOTE — ED Notes (Signed)
Awake. Verbally responsive. A/O x4. Resp even and unlabored. No audible adventitious breath sounds noted. ABC's intact.  Pt lying in lt lateral position. BLE remains swollen and red. Family at bedside.

## 2015-03-16 NOTE — ED Provider Notes (Signed)
CSN: [id number]     Arrival date & time 03/16/15  1613 History   First MD Initiated Contact with Patient 03/16/15 1621     Chief Complaint  Patient presents with  . Leg Pain     (Consider location/radiation/quality/duration/timing/severity/associated sxs/prior Treatment) HPI Comments: Patient with a history of PE, DVT, COPD, DM presents today from [facility] with LLE swelling and erythema.  She reports that she first noticed the swelling and erythema this morning and that symptoms have been progressively worsening.  She has not taken anything for symptoms prior to arrival.  She reports that she had an IVC filter placed in 2010.  She denies CP, SOB, nausea, vomiting, fever, or chills.  No known injury.  She reports that she currently ambulates very little in the nursing home.  She is currently not on any anticoagulants. She was taken off of anticoagulants in 2010 due to a retroperitoneal hemorrhage.    The history is provided by the patient.    Past Medical History  Diagnosis Date  . Anxiety   . Arthritis   . COPD (chronic obstructive pulmonary disease)   . GERD (gastroesophageal reflux disease)   . Hyperlipidemia   . Osteoporosis   . DVT (deep venous thrombosis)   . PE (pulmonary thromboembolism)   . Hypertension      PT DENIES....ON NO  MEDS  . Diabetes mellitus   . Cancer 1969    cervical  . Pneumonia     h/o  . Bronchitis     h/o  . Blood transfusion   . Anemia   . Bipolar affect, depressed    Past Surgical History  Procedure Laterality Date  . Vena cava filter placement    . 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 Eye surgery      CATARACTS  . Achilles tendon lengthening  11/18/11    and repair w/posterior tibial tendon lengthening; right  foot  . Cholecystectomy    . Abdominal hysterectomy  1969    "womb taken out for cervical cancer"  . Cataract extraction, bilateral  ~ 2008  . Tonsillectomy      "as a child"  . Achilles tendon surgery   11/18/2011    Procedure: ACHILLES TENDON REPAIR;  Surgeon: Wylene Simmer, MD;  Location: [facility];  Service: Orthopedics;  Laterality: Right;  Right Posterior Tibial Tendon Lenghtening and Tendon Achilles Lenghtening    Family History  Problem Relation Age of Onset  . Heart disease Father   . Heart attack Father   . Clotting disorder Father   . Stroke Mother    History  Substance Use Topics  . Smoking status: Former Smoker -- 1.00 packs/day    Types: Cigarettes    Quit date: 11/14/1986  . Smokeless tobacco: Never Used     Comment: pt cannot recall the total amt of time she smoked.  . Alcohol Use: No   OB History    No data available     Review of Systems  All other systems reviewed and are negative.     Allergies  Benzodiazepines; Morphine and related; Olanzapine; and Sulfamethoxazole-trimethoprim  Home Medications   Prior to Admission medications   Medication Sig Start Date End Date Taking? Authorizing Provider  acetaminophen (TYLENOL) 325 MG tablet Take 650 mg by mouth every 4 (four) hours as needed for mild pain.    Historical Provider, MD  ALPRAZolam Duanne Moron) 1 MG tablet Take 1 mg by  mouth 2 (two) times daily. Take at 0600, 1000, 1400, and 1800. 08/13/14   Blanchie Serve, MD  alum & mag hydroxide-simeth (MAALOX/MYLANTA) 200-200-20 MG/5ML suspension Take 30 mLs by mouth every 6 (six) hours as needed for indigestion or heartburn.    Historical Provider, MD  antiseptic oral rinse (BIOTENE) LIQD 15 mLs by Mouth Rinse route every 4 (four) hours as needed for dry mouth.    Historical Provider, MD  Artificial Tear Ointment (REFRESH LACRI-LUBE) OINT Place 1 application into both eyes at bedtime.    Historical Provider, MD  aspirin 81 MG chewable tablet Chew 81 mg by mouth daily.    Historical Provider, MD  benzocaine-menthol (CHLORASEPTIC) 6-10 MG lozenge Take 1 lozenge by mouth every 2 (two) hours as needed for sore throat.    Historical Provider, MD  bisacodyl (DULCOLAX) 10 MG  suppository Place 10 mg rectally as directed. On Monday, Wednesday, and Friday and daily as needed if no bowel movement in 2 days.    Historical Provider, MD  Cranberry-Vitamin C-Inulin (UTI-STAT) LIQD Take 30 mLs by mouth daily.     Historical Provider, MD  esomeprazole (NEXIUM) 40 MG capsule Take 40 mg by mouth daily at 12 noon.    Historical Provider, MD  fenofibrate (TRICOR) 145 MG tablet Take 145 mg by mouth daily.    Historical Provider, MD  fish oil-omega-3 fatty acids 1000 MG capsule Take 1 g by mouth 2 (two) times daily.     Historical Provider, MD  Fluticasone-Salmeterol (ADVAIR DISKUS) 250-50 MCG/DOSE AEPB Inhale 1 puff into the lungs 2 (two) times daily. 03/11/14   Kathee Delton, MD  gabapentin (NEURONTIN) 300 MG capsule Take 3 capsules (900 mg total) by mouth 3 (three) times daily. 01/01/14   Geradine Girt, DO  HYDROcodone-acetaminophen (NORCO/VICODIN) 5-325 MG per tablet Take 1 tablet by mouth every 6 (six) hours as needed for moderate pain. 01/28/15   Estill Dooms, MD  insulin aspart (NOVOLOG) 100 UNIT/ML FlexPen Inject 3-5 Units into the skin 3 (three) times daily before meals. Give 3 units before meals with an additional 5 units for CBG> or < 150    Historical Provider, MD  insulin glargine (LANTUS) 100 UNIT/ML injection Inject 22 Units into the skin at bedtime. 01/01/14   Geradine Girt, DO  lamoTRIgine (LAMICTAL) 25 MG tablet Take 150 mg by mouth daily.     Historical Provider, MD  levalbuterol [facility] HFA) 45 MCG/ACT inhaler Inhale 2 puffs into the lungs every 4 (four) hours as needed for wheezing.     Historical Provider, MD  levalbuterol (XOPENEX) 1.25 MG/0.5ML nebulizer solution Take 1.25 mg by nebulization 4 (four) times daily as needed for wheezing or shortness of breath. 01/01/14   Geradine Girt, DO  loperamide (IMODIUM) 2 MG capsule Take 2 mg by mouth as needed for diarrhea or loose stools.    Historical Provider, MD  loratadine (CLARITIN) 10 MG tablet Take 10 mg by mouth  daily.    Historical Provider, MD  LORazepam (ATIVAN) 2 MG/ML concentrated solution Take 1 mg by mouth every 12 (twelve) hours as needed for anxiety (and agitation.).    Historical Provider, MD  LORazepam (ATIVAN) 2 MG/ML injection Inject 0.26ml intramuscular every 12 hours as needed for severe agitation 12/09/14   Tiffany L Reed, DO  magnesium hydroxide (MILK OF MAGNESIA) 400 MG/5ML suspension Take 30 mLs by mouth daily as needed for mild constipation.    Historical Provider, MD  metFORMIN (GLUCOPHAGE) 1000 MG tablet  Take 1,000 mg by mouth 2 (two) times daily with a meal.     Historical Provider, MD  metoprolol tartrate (LOPRESSOR) 25 MG tablet Take 25 mg by mouth 2 (two) times daily. At 0800 and 1700. 01/01/14   Geradine Girt, DO  naproxen sodium (ANAPROX) 220 MG tablet Take 220 mg by mouth 2 (two) times daily with a meal.    Historical Provider, MD  OxyCODONE (OXYCONTIN) 10 mg T12A 12 hr tablet Take one tablet by mouth every 12 hours for pain. Do not crush 02/19/15   Gildardo Cranker, DO  OXYGEN Inhale 2 L into the lungs continuous. Via nasal cannula.    Historical Provider, MD  Polyethyl Glycol-Propyl Glycol (SYSTANE) 0.4-0.3 % SOLN Place 1 drop into both eyes 4 (four) times daily.    Historical Provider, MD  polyethylene glycol (MIRALAX / GLYCOLAX) packet Take 17 g by mouth daily.     Historical Provider, MD  polyvinyl alcohol (LIQUIFILM TEARS) 1.4 % ophthalmic solution Place 2 drops into both eyes 4 (four) times daily as needed for dry eyes.    Historical Provider, MD  potassium chloride (K-DUR,KLOR-CON) 10 MEQ tablet Take 5 mEq by mouth every evening.  08/25/11   Judithann Sheen, MD  promethazine (PHENERGAN) 25 MG tablet Take 25 mg by mouth every 6 (six) hours as needed. nausea    Historical Provider, MD  QUEtiapine (SEROQUEL) 300 MG tablet Take 100 mg by mouth at bedtime.     Historical Provider, MD  risperiDONE (RISPERDAL) 2 MG tablet Take 2 mg by mouth 2 (two) times daily.    Historical Provider,  MD  simvastatin (ZOCOR) 10 MG tablet Take 10 mg by mouth daily at 6 PM.     Historical Provider, MD  sodium phosphate (FLEET) enema Place 1 enema rectally daily as needed (constipation). follow package directions    Historical Provider, MD  tiotropium (SPIRIVA) 18 MCG inhalation capsule Place 1 capsule (18 mcg total) into inhaler and inhale daily. 08/25/11   Judithann Sheen, MD  torsemide (DEMADEX) 20 MG tablet Take 40 mg by mouth daily.     Historical Provider, MD  traZODone (DESYREL) 150 MG tablet Take 150 mg by mouth at bedtime.    Historical Provider, MD  trimethoprim (TRIMPEX) 100 MG tablet Take 100 mg by mouth daily.    Historical Provider, MD  venlafaxine XR (EFFEXOR-XR) 150 MG 24 hr capsule Take 300 mg by mouth every morning.  01/31/12   Waldemar Dickens, MD   BP 140/80 mmHg  Pulse 78  Temp(Src) 98.4 F (36.9 C) (Oral)  Resp 22  SpO2 98% Physical Exam  Constitutional: She appears well-developed and well-nourished.  HENT:  Head: Normocephalic and atraumatic.  Mouth/Throat: Oropharynx is clear and moist.  Neck: Normal range of motion. Neck supple.  Cardiovascular: Normal rate, regular rhythm and normal heart sounds.   Pulmonary/Chest: Effort normal and breath sounds normal.  Abdominal: Soft. Bowel sounds are normal. She exhibits no distension and no mass. There is no tenderness. There is no rebound and no guarding.  Musculoskeletal: Normal range of motion.  Erythema and edema of the LLE.  No warmth.    Neurological: She is alert.  Distal sensation of the LLE intact  Skin: Skin is warm and dry.  Psychiatric: She has a normal mood and affect.  Nursing note and vitals reviewed.   ED Course  Procedures (including critical care time) Labs Review Labs Reviewed  CBC WITH DIFFERENTIAL/PLATELET  COMPREHENSIVE METABOLIC PANEL  BRAIN  NATRIURETIC PEPTIDE    Imaging Review No results found.   EKG Interpretation None     5:56 PM Discussed anticoagulation with Dr. Kellie Simmering with  Vascular Surgery.  He recommends starting the patient on one of the newer anticoagaulants such as Eliquis or Pradaxa.   MDM   Final diagnoses:  None   Patient presents today from [facility] with LLE edema and erythema.  She has a history of a DVT and PE and currently has an IVC filter in place.  Lower extremity doppler showing DVT.  She denies any chest pain or SOB at this time.  Review of the chart shows that she was taken off of anticoagulants in 2010 after she had a retroperitoneal hemorrhage.  Discussed anticoagulation therapy recommendations with Dr Kellie Simmering with Vascular Surgery given her history.  He recommending starting her on either Pradaxa or Eliquis.  Patient started on Eliquis and discharged back to the Nursing Home.  Patient also evaluated by Dr. Colin Rhein who is in agreement with plan.  Return precautions given.    Hyman Bible, PA-C 03/18/15 [place], MD 03/19/15 [phone]

## 2015-03-16 NOTE — Progress Notes (Signed)
VASCULAR LAB PRELIMINARY  PRELIMINARY  PRELIMINARY  PRELIMINARY  Bilateral lower extremity venous Dopplers completed.    Preliminary report:  There is acute, occlusive DVT noted in the left common femoral and femoral veins and partially occlusive, age indeterminate DVT noted in the left popliteal vein.  There are enlarged lymph nodes noted in the bilateral groins and significant interstitial fluid noted in the bilateral calves.  Chrisette Man, RVT 03/16/2015, 5:20 PM

## 2015-03-16 NOTE — ED Notes (Signed)
2 unsuccessful attempts by staff RNs to obtain blood.  Dr. Colin Rhein made aware.

## 2015-03-16 NOTE — ED Notes (Signed)
Pt family member to desk multiple times, demanding drinks and medications. Charge RN aware of this situation

## 2015-03-16 NOTE — ED Notes (Signed)
[place] at bedside.  Unable to obtain blood at this time. Will attempt after procedure.

## 2015-03-17 ENCOUNTER — Non-Acute Institutional Stay (SKILLED_NURSING_FACILITY): Payer: Medicare Other | Admitting: Internal Medicine

## 2015-03-17 DIAGNOSIS — I82402 Acute embolism and thrombosis of unspecified deep veins of left lower extremity: Secondary | ICD-10-CM

## 2015-03-17 NOTE — Progress Notes (Signed)
Patient ID: [name], female   DOB: February 06, 1941, 74 y.o.   MRN: [id number]   This is an acute visit.  Level care skilled.  [place] [facility].  Chief complaint acute visit secondary to left leg DVT.  History of present illness.  Patient is a 74 year old female who is been a long-term resident of this facility.  Over the weekend nursing staff noted a swollen left leg-she was sent to the ER where a venous Doppler did reveal an acute DVT involving the common femoral and popliteal veins.  She was given a prescription for Eliquis although unclear whether she receive this in the ER and she is return to the facility.  Apparently the Eliquis has not arrived from the pharmacy yet.  The leg continues to be swollen somewhat uncomfortable although she does not appear to be acute distress currently.  Family medical social history has been reviewed most recently progress note on 03/04/2015.--I do note she does have a history of an IVC filter  Medications have been reviewed per MAR.  Review of systems.  Patient is a somewhat poor historian.  Gen. she is not complaining of fever or chills appears to be anxious somewhat however lying in bed.  Skin does not complain of rashes or itching currently.  Respiratory is not complaining of acute shortness of breath or cough.  Cardiac does not complain of chest pain.  GI is not complaining of abdominal discomfort.  Musculoskeletal is complaining of some leg discomfort.  Neurologic is not complaining of dizziness or headache.  Physical exam.  She is afebrile pulse 96 respirations 20 blood pressure is pending.  In general this is a somewhat obese elderly female anxious but in no acute distress lying in bed.  Her skin is warm and dry.  Chest is clear to auscultation with shallow air entry.  Heart is regular rate and rhythm without murmur gallop or rub she has 2+ lower extremity edema on the left eyelid say one plus on the right could not  really palpate the pedal pulse on the left.  Her abdomen is obese soft nontender positive bowel sounds.  Muscle skeletal again does have the increase left leg edema there is some pale erythema and tenderness to palpation of the lower leg.  Neurologic appears to be grossly intact she does have some dysarthric. Speech which is baseline.  Psych again she appears to be somewhat anxious although this is not uncommon she is apparently frustrated lying in bed.  Labs.  03/16/2015.  BNP 23.6.  Sodium 136 potassium 3.9 BUN 13 creatinine 0.71.  Liver function tests within normal limits.  WBC 6.1 hemoglobin 10.9 platelets were clumped.  Assessment and plan.  Left leg DVT-I note she does have a previous history of IVC filter with a history of pulmonary embolism.  The Eliquis has not yet arrived to the facility this is somewhat concerning with her history here.  This was discussed with Dr. Sheppard Coil via phone and will start Lovenox until the Eliquis arrives in the facility hopefully later today or this evening.  Continue bed rest certainly at this point.  Eliquis will be 10 mg twice a day for 7 days and then reduce to 5 mg twice a day.  She will have to be monitored closely vital signs pulse ox every shift for 48 hours clinically she appears stable somewhat anxious I feel this is probably mainly because she feels confined to bed  [id number]

## 2015-03-18 LAB — CBC AND DIFFERENTIAL
Platelets: 233 10*3/uL (ref 150–399)
WBC: 4.6 10*3/mL

## 2015-03-18 LAB — BASIC METABOLIC PANEL
BUN: 13 mg/dL (ref 4–21)
GLUCOSE: 180 mg/dL
Potassium: 4.2 mmol/L (ref 3.4–5.3)
SODIUM: 140 mmol/L (ref 137–147)

## 2015-03-19 ENCOUNTER — Encounter: Payer: Self-pay | Admitting: Internal Medicine

## 2015-03-19 ENCOUNTER — Non-Acute Institutional Stay (SKILLED_NURSING_FACILITY): Payer: Medicare Other | Admitting: Internal Medicine

## 2015-03-19 DIAGNOSIS — I82402 Acute embolism and thrombosis of unspecified deep veins of left lower extremity: Secondary | ICD-10-CM | POA: Diagnosis not present

## 2015-03-19 NOTE — Assessment & Plan Note (Addendum)
LLE edema and erythema. She has a history of a DVT and PE and currently has an IVC filter in place. Lower extremity doppler showing DVT. She denies any chest pain or SOB at this time. Review of the chart shows that she was taken off of anticoagulants in 2010 after she had a retroperitoneal hemorrhage. Discussed anticoagulation therapy recommendations with Dr Kellie Simmering with Vascular Surgery given her history. He recommending starting her on either Pradaxa or Eliquis. Patient started on Eliquis but only got her first dose this am, she has been on lovenox since back from ED because eliquis had not arrived from pharmacy. Pt'sL leg is twice the size of the R but heat and erythema improved. Pt would not be candidate for heparin in hospital if she had spontaneous bleed on coumadin.Will hold pt's PT and have her with leg elevation for as long as she will allow, hopefully for at least 3 days.

## 2015-03-19 NOTE — Progress Notes (Signed)
MRN: [id number] Name: [name]  Sex: female Age: 74 y.o. DOB: 06/05/1941  [place] #: [facility] Facility/Room: 217W Level Of Care: SNF Provider: Inocencio Homes D Emergency Contacts: Extended Emergency Contact Information Primary Emergency Contact: Wright,[name] Address: [place], [place] [address] [place] Work Phone: [phone] Mobile Phone: [phone] Relation: Daughter Secondary Emergency Contact: Rachelle Hora [place] Phone: [phone] Relation: Sister  Code Status: FULL  Allergies: Benzodiazepines; Morphine and related; Olanzapine; and Sulfamethoxazole-trimethoprim  Chief Complaint  Patient presents with  . Acute Visit    HPI: Patient is 74 y.o. female who is being seen for leg swelling due to acute and chronic LLE DVT.  Past Medical History  Diagnosis Date  . Anxiety   . Arthritis   . COPD (chronic obstructive pulmonary disease)   . GERD (gastroesophageal reflux disease)   . Hyperlipidemia   . Osteoporosis   . DVT (deep venous thrombosis)   . PE (pulmonary thromboembolism)   . Hypertension      PT DENIES....ON NO  MEDS  . Diabetes mellitus   . Cancer 1969    cervical  . Pneumonia     h/o  . Bronchitis     h/o  . Blood transfusion   . Anemia   . Bipolar affect, depressed     Past Surgical History  Procedure Laterality Date  . Vena cava filter placement    . 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 Eye surgery      CATARACTS  . Achilles tendon lengthening  11/18/11    and repair w/posterior tibial tendon lengthening; right  foot  . Cholecystectomy    . Abdominal hysterectomy  1969    "womb taken out for cervical cancer"  . Cataract extraction, bilateral  ~ 2008  . Tonsillectomy      "as a child"  . Achilles tendon surgery  11/18/2011    Procedure: ACHILLES TENDON REPAIR;  Surgeon: Wylene Simmer, MD;  Location: [facility];  Service: Orthopedics;  Laterality: Right;  Right  Posterior Tibial Tendon Lenghtening and Tendon Achilles Lenghtening       Medication List       This list is accurate as of: 03/19/15 12:13 PM.  Always use your most recent med list.               acetaminophen 325 MG tablet  Commonly known as:  TYLENOL  Take 650 mg by mouth every 4 (four) hours as needed for mild pain.     ALPRAZolam 1 MG tablet  Commonly known as:  XANAX  Take 1 mg by mouth 2 (two) times daily. Take at 0600, 1000, 1400, and 1800.     antiseptic oral rinse Liqd  15 mLs by Mouth Rinse route every 4 (four) hours as needed for dry mouth.     apixaban 5 MG Tabs tablet  Commonly known as:  ELIQUIS  Take 2 tablets (10 mg total) by mouth 2 (two) times daily.     aspirin 81 MG chewable tablet  Chew 81 mg by mouth daily.     bisacodyl 10 MG suppository  Commonly known as:  DULCOLAX  Place 10 mg rectally as directed. On Monday, Wednesday, and Friday and daily as needed if no bowel movement in 2 days.     CHLORASEPTIC 6-10 MG lozenge  Generic drug:  benzocaine-menthol  Take 1 lozenge  by mouth every 2 (two) hours as needed for sore throat.     esomeprazole 40 MG capsule  Commonly known as:  NEXIUM  Take 40 mg by mouth daily at 12 noon.     fenofibrate 145 MG tablet  Commonly known as:  TRICOR  Take 145 mg by mouth daily.     fish oil-omega-3 fatty acids 1000 MG capsule  Take 1 g by mouth 2 (two) times daily.     Fluticasone-Salmeterol 250-50 MCG/DOSE Aepb  Commonly known as:  ADVAIR DISKUS  Inhale 1 puff into the lungs 2 (two) times daily.     gabapentin 300 MG capsule  Commonly known as:  NEURONTIN  Take 3 capsules (900 mg total) by mouth 3 (three) times daily.     HYDROcodone-acetaminophen 5-325 MG per tablet  Commonly known as:  NORCO/VICODIN  Take 1 tablet by mouth every 6 (six) hours as needed for moderate pain.     insulin aspart 100 UNIT/ML FlexPen  Commonly known as:  NOVOLOG  Inject 3-5 Units into the skin 3 (three) times daily before  meals. Give 3 units before meals with an additional 5 units for CBG> or < 150     insulin glargine 100 UNIT/ML injection  Commonly known as:  LANTUS  Inject 22 Units into the skin at bedtime.     insulin glargine 100 UNIT/ML injection  Commonly known as:  LANTUS  Inject 20 Units into the skin daily.     lamoTRIgine 150 MG tablet  Commonly known as:  LAMICTAL  Take 150 mg by mouth daily.     levalbuterol 1.25 MG/0.5ML nebulizer solution  Commonly known as:  XOPENEX  Take 1.25 mg by nebulization 4 (four) times daily as needed for wheezing or shortness of breath.     levalbuterol 45 MCG/ACT inhaler  Commonly known as:  XOPENEX HFA  Inhale 2 puffs into the lungs every 4 (four) hours as needed for wheezing.     loperamide 2 MG capsule  Commonly known as:  IMODIUM  Take 2 mg by mouth as needed for diarrhea or loose stools.     loratadine 10 MG tablet  Commonly known as:  CLARITIN  Take 10 mg by mouth daily.     LORazepam 2 MG/ML concentrated solution  Commonly known as:  ATIVAN  Take 1 mg by mouth every 12 (twelve) hours as needed for anxiety (and agitation.).     magnesium hydroxide 400 MG/5ML suspension  Commonly known as:  MILK OF MAGNESIA  Take 30 mLs by mouth daily as needed for mild constipation.     metoprolol tartrate 25 MG tablet  Commonly known as:  LOPRESSOR  Take 25 mg by mouth 2 (two) times daily. At 0800 and 1700.     OxyCODONE 10 mg T12a 12 hr tablet  Commonly known as:  OXYCONTIN  Take one tablet by mouth every 12 hours for pain. Do not crush     OXYGEN  Inhale 2 L into the lungs continuous. Via nasal cannula.     polyethylene glycol packet  Commonly known as:  MIRALAX / GLYCOLAX  Take 17 g by mouth daily.     polyvinyl alcohol 1.4 % ophthalmic solution  Commonly known as:  LIQUIFILM TEARS  Place 2 drops into both eyes 4 (four) times daily as needed for dry eyes.     potassium chloride 10 MEQ tablet  Commonly known as:  K-DUR,KLOR-CON  Take 5 mEq  by mouth every evening.  promethazine 25 MG tablet  Commonly known as:  PHENERGAN  Take 25 mg by mouth every 6 (six) hours as needed. nausea     QUEtiapine 50 MG tablet  Commonly known as:  SEROQUEL  Take 50 mg by mouth at bedtime. Take along with Seroquel 100mg  to = 150mg  at bedtime.     QUEtiapine 100 MG tablet  Commonly known as:  SEROQUEL  Take 100 mg by mouth at bedtime. Give 1 tablet along with Seroquel 50mg  to = 150mg  at bedtime     REFRESH LACRI-LUBE Oint  Place 1 application into both eyes at bedtime.     risperiDONE 2 MG tablet  Commonly known as:  RISPERDAL  Take 2 mg by mouth 2 (two) times daily.     simvastatin 10 MG tablet  Commonly known as:  ZOCOR  Take 10 mg by mouth daily at 6 PM.     sodium phosphate enema  Commonly known as:  FLEET  Place 1 enema rectally daily as needed (constipation). follow package directions     SYSTANE 0.4-0.3 % Soln  Generic drug:  Polyethyl Glycol-Propyl Glycol  Place 1 drop into both eyes 4 (four) times daily.     tiotropium 18 MCG inhalation capsule  Commonly known as:  SPIRIVA  Place 1 capsule (18 mcg total) into inhaler and inhale daily.     torsemide 20 MG tablet  Commonly known as:  DEMADEX  Take 40 mg by mouth daily.     traZODone 150 MG tablet  Commonly known as:  DESYREL  Take 150 mg by mouth at bedtime.     trimethoprim 100 MG tablet  Commonly known as:  TRIMPEX  Take 100 mg by mouth daily.     UTI-STAT Liqd  Take 30 mLs by mouth daily.     venlafaxine XR 150 MG 24 hr capsule  Commonly known as:  EFFEXOR-XR  Take 300 mg by mouth every morning.        No orders of the defined types were placed in this encounter.    Immunization History  Administered Date(s) Administered  . Influenza Split 10/20/2011  . Influenza Whole 10/05/2007, 09/16/2008, 11/17/2010, 10/01/2013  . Influenza,inj,Quad PF,36+ Mos 09/02/2014  . Pneumococcal Polysaccharide-23 10/21/2003, 11/17/2010, 01/27/2012  . Td 09/20/2003     History  Substance Use Topics  . Smoking status: Former Smoker -- 1.00 packs/day    Types: Cigarettes    Quit date: 11/14/1986  . Smokeless tobacco: Never Used     Comment: pt cannot recall the total amt of time she smoked.  . Alcohol Use: No    Review of Systems  DATA OBTAINED: from patient, nurse, medical record, family member GENERAL:  no fevers, fatigue, appetite changes SKIN: No itching, rash HEENT: No complaint RESPIRATORY: No cough, wheezing, SOB CARDIAC: No chest pain, palpitations, lower extremity edema  GI: No abdominal pain, No N/V/D or constipation, No heartburn or reflux  GU: No dysuria, frequency or urgency, or incontinence  MUSCULOSKELETAL:LLE pain NEUROLOGIC: No headache, dizziness  PSYCHIATRIC: No overt anxiety or sadness  Filed Vitals:   03/19/15 1150  BP: 134/81  Pulse: 98  Temp: 97.5 F (36.4 C)  Resp: 18    Physical Exam  GENERAL APPEARANCE: Alert, conversant SKIN: No diaphoresis rash HEENT: Unremarkable RESPIRATORY: Breathing is even, unlabored. Lung sounds are clear   CARDIOVASCULAR: Heart RRR no murmurs, rubs or gallops; LLE edema ,L  leg is twice the size of her R leg and tense swelling into groin;do not  feel DP pulse but foot is warm,good color, minimally delayed cap refill; there is no compartment syndrome GASTROINTESTINAL: Abdomen is soft, non-tender, not distended w/ normal bowel sounds.  GENITOURINARY: Bladder non tender, not distended  MUSCULOSKELETAL: No abnormal joints or musculature NEUROLOGIC: Cranial nerves 2-12 grossly intact. Moves all extremities PSYCHIATRIC: Mood and affect appropriate to situation, no behavioral issues  Patient Active Problem List   Diagnosis Date Noted  . Psychosis 03/04/2015  . Bipolar depression 03/04/2015  . Candidiasis, intertrigo 02/07/2015  . Lethargy 10/17/2014  . Sore throat 10/08/2014  . Chest congestion 10/08/2014  . Dysphagia, oropharyngeal 09/28/2014  . Neuropathy due to secondary  diabetes 09/28/2014  . Esophageal dysphagia 08/09/2014  . Non compliance with medical treatment 08/09/2014  . MRSA cellulitis 02/20/2014  . Tardive dyskinesia 02/10/2014  . Infection due to ESBL-producing Escherichia coli 02/02/2014  . Sepsis 01/21/2014  . Acute respiratory failure with hypoxia 01/21/2014  . Fever, unspecified 01/21/2014  . Septic shock 12/25/2013  . UTI 12/25/2013  . Acute encephalopathy 12/25/2013  . Pneumonia, organism unspecified 12/21/2013  . Cough 12/21/2013  . Diarrhea 12/21/2013  . Diaper candidiasis 12/17/2013  . Dysuria 09/17/2013  . Pure hyperglyceridemia 06/18/2013  . Type 2 diabetes mellitus with neurological manifestations, uncontrolled 05/04/2013  . Chronic pain syndrome 05/04/2013  . Constipation 04/04/2013  . Venous stasis dermatitis 04/03/2012  . COPD exacerbation 02/21/2012  . Tachycardia 12/27/2011  . Thrush 11/10/2011  . Ankle arthritis 07/28/2011  . Chronic right shoulder pain 06/03/2011  . Leg swelling 03/03/2011  . INSOMNIA, CHRONIC 11/17/2010  . Other chronic pain 11/17/2010  . Presence of IVC filter 11/17/2010  . NEOPLASM UNCERTAIN BEHAVIOR MAJOR SALIV GLANDS 11/10/2009  . Personal history of PE (pulmonary embolism) 10/02/2009  . Acute thromboembolism of deep veins of lower extremity 10/02/2009  . Pulmonary nodules 09/12/2009  . Wheelchair dependence 04/10/2009  . COR PULMONALE 05/22/2008  . VENOUS INSUFFICIENCY, CHRONIC 03/25/2008  . SYMPTOM, INCONTINENCE, URINARY NOS 07/13/2007  . Diabetes type 2, controlled 05/11/2007  . Anemia 05/11/2007  . GERD 05/11/2007  . Hyperlipidemia LDL goal <70 02/16/2007  . Obesity, diabetes, and hypertension syndrome 02/16/2007  . BIPOLAR DISORDER 02/16/2007  . Anxiety with obsessional features 02/16/2007  . HYPERTENSION, BENIGN SYSTEMIC 02/16/2007  . COPD (chronic obstructive pulmonary disease) with emphysema 02/16/2007  . OSTEOPENIA 02/16/2007  . TOBACCO ABUSE, HX OF 02/16/2007    CBC     Component Value Date/Time   WBC 6.1 03/16/2015 1943   RBC 3.88 03/16/2015 1943   HGB 10.9* 03/16/2015 1943   HCT 35.8* 03/16/2015 1943   PLT  03/16/2015 1943    PLATELET CLUMPS NOTED ON SMEAR, COUNT APPEARS ADEQUATE   MCV 92.3 03/16/2015 1943   LYMPHSABS 1.8 03/16/2015 1943   MONOABS 0.5 03/16/2015 1943   EOSABS 0.3 03/16/2015 1943   BASOSABS 0.0 03/16/2015 1943    CMP     Component Value Date/Time   NA 136 03/16/2015 1943   K 3.9 03/16/2015 1943   CL 92* 03/16/2015 1943   CO2 35* 03/16/2015 1943   GLUCOSE 267* 03/16/2015 1943   BUN 13 03/16/2015 1943   CREATININE 0.71 03/16/2015 1943   CREATININE 0.73 03/24/2011 1614   CALCIUM 8.7 03/16/2015 1943   PROT 7.6 03/16/2015 1943   ALBUMIN 3.7 03/16/2015 1943   AST 21 03/16/2015 1943   ALT 17 03/16/2015 1943   ALKPHOS 55 03/16/2015 1943   BILITOT 0.5 03/16/2015 1943   GFRNONAA 84* 03/16/2015 1943   GFRAA >90 03/16/2015 1943  [facility] [place] [facility] 03/16/15       acute deep vein thrombosis involving the common femoral, femoral, and popliteal veins of the left lower extremity. No propagation noted to the right lower extremity. Enlarged lymph nodes noted in groin area. Interstitial edema in calf.   Assessment and Plan  Acute thromboembolism of deep veins of lower extremity LLE edema and erythema. She has a history of a DVT and PE and currently has an IVC filter in place. Lower extremity doppler showing DVT. She denies any chest pain or SOB at this time. Review of the chart shows that she was taken off of anticoagulants in 2010 after she had a retroperitoneal hemorrhage. Discussed anticoagulation therapy recommendations with Dr Kellie Simmering with [facility] given her history. He recommending starting her on either Pradaxa or Eliquis. Patient started on Eliquis but only got her first dose this am, she has been on lovenox since back from ED because eliquis had not arrived from pharmacy. Pt'sL leg is twice the size of the R with  heat and erythema improved.     Hennie Duos, MD

## 2015-03-24 ENCOUNTER — Ambulatory Visit (INDEPENDENT_AMBULATORY_CARE_PROVIDER_SITE_OTHER): Payer: Medicare Other | Admitting: Pulmonary Disease

## 2015-03-24 ENCOUNTER — Encounter: Payer: Self-pay | Admitting: Pulmonary Disease

## 2015-03-24 VITALS — BP 136/68 | HR 81 | Temp 97.0°F

## 2015-03-24 DIAGNOSIS — R918 Other nonspecific abnormal finding of lung field: Secondary | ICD-10-CM | POA: Diagnosis not present

## 2015-03-24 DIAGNOSIS — J438 Other emphysema: Secondary | ICD-10-CM

## 2015-03-24 NOTE — Patient Instructions (Signed)
No change in medications Will schedule followup ct chest at next visit in October followup with me again in 43mos

## 2015-03-24 NOTE — Progress Notes (Signed)
   Subjective:    Patient ID: [name], female    DOB: 1941-12-01, 74 y.o.   MRN: [id number]  HPI The patient comes in today for follow-up of her known COPD. She has been staying on her Advair and Spiriva, with as needed Xopenex. She has had a recent DVT, and feels that her breathing is staying near her usual baseline. She has not had an acute exacerbation of her COPD or pulmonary infection.   Review of Systems  Constitutional: Positive for fatigue. Negative for fever and unexpected weight change.  HENT: Negative for congestion, dental problem, ear pain, nosebleeds, postnasal drip, rhinorrhea, sinus pressure, sneezing, sore throat and trouble swallowing.   Eyes: Negative for redness and itching.  Respiratory: Positive for cough, shortness of breath and wheezing. Negative for chest tightness.   Cardiovascular: Negative for palpitations and leg swelling.  Gastrointestinal: Negative for nausea and vomiting.  Genitourinary: Negative for dysuria.  Musculoskeletal: Negative for joint swelling.  Skin: Negative for rash.  Neurological: Negative for headaches.  Hematological: Does not bruise/bleed easily.  Psychiatric/Behavioral: Negative for dysphoric mood. The patient is not nervous/anxious.        Objective:   Physical Exam Obese female in no acute distress Nose without purulence or discharge noted Neck without lymphadenopathy or thyromegaly Chest with very diminished breath sounds, a few basilar crackles, no wheezing Cardiac exam with regular rate and rhythm Lower extremities with 1-2+ edema, no cyanosis Alert and oriented, moves all 4 extremities.       Assessment & Plan:

## 2015-03-24 NOTE — Assessment & Plan Note (Signed)
The patient appears to be fairly stable from a COPD standpoint. She maintains on her bronchodilator regimen, and has not had a recent acute exacerbation or pulmonary infection. I've asked her to continue on her current meds, and to work on conditioning and weight reduction.

## 2015-03-24 NOTE — Assessment & Plan Note (Signed)
[name] last CT in October 2015 shows her nodules to be stable. She will need one more follow-up in October of this year. This can be arranged after her follow-up visit.

## 2015-04-16 LAB — HEMOGLOBIN A1C: HEMOGLOBIN A1C: 8 % — AB (ref 4.0–6.0)

## 2015-04-28 ENCOUNTER — Encounter: Payer: Self-pay | Admitting: *Deleted

## 2015-05-09 ENCOUNTER — Non-Acute Institutional Stay (SKILLED_NURSING_FACILITY): Payer: Medicare Other | Admitting: Internal Medicine

## 2015-05-09 DIAGNOSIS — D638 Anemia in other chronic diseases classified elsewhere: Secondary | ICD-10-CM

## 2015-05-09 DIAGNOSIS — E119 Type 2 diabetes mellitus without complications: Secondary | ICD-10-CM

## 2015-05-09 DIAGNOSIS — I82402 Acute embolism and thrombosis of unspecified deep veins of left lower extremity: Secondary | ICD-10-CM | POA: Diagnosis not present

## 2015-05-09 NOTE — Progress Notes (Signed)
MRN: [id number] Name: [name]  Sex: female Age: 74 y.o. DOB: 1941-07-02  [facility] #: [facility] Facility/Room:217 Level Of Care: SNF Provider: Inocencio Homes D Emergency Contacts: Extended Emergency Contact Information Primary Emergency Contact: Wright,[name] Address: [place], [place] [address] [place] Work Phone: [phone] Mobile Phone: [phone] Relation: Daughter Secondary Emergency Contact: Rachelle Hora [place] Phone: [phone] Relation: Sister  Code Status: FULL  Allergies: Benzodiazepines; Morphine and related; Olanzapine; and Sulfamethoxazole-trimethoprim  Chief Complaint  Patient presents with  . Medical Management of Chronic Issues    HPI: Patient is 74 y.o. female who is being seen for routine issues.  Past Medical History  Diagnosis Date  . Anxiety   . Arthritis   . COPD (chronic obstructive pulmonary disease)   . GERD (gastroesophageal reflux disease)   . Hyperlipidemia   . Osteoporosis   . DVT (deep venous thrombosis)   . PE (pulmonary thromboembolism)   . Hypertension      PT DENIES....ON NO  MEDS  . Diabetes mellitus   . Cancer 1969    cervical  . Pneumonia     h/o  . Bronchitis     h/o  . Blood transfusion   . Anemia   . Bipolar affect, depressed     Past Surgical History  Procedure Laterality Date  . Vena cava filter placement    . 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 Eye surgery      CATARACTS  . Achilles tendon lengthening  11/18/11    and repair w/posterior tibial tendon lengthening; right  foot  . Cholecystectomy    . Abdominal hysterectomy  1969    "womb taken out for cervical cancer"  . Cataract extraction, bilateral  ~ 2008  . Tonsillectomy      "as a child"  . Achilles tendon surgery  11/18/2011    Procedure: ACHILLES TENDON REPAIR;  Surgeon: Wylene Simmer, MD;  Location: [facility];  Service: Orthopedics;  Laterality: Right;  Right Posterior Tibial  Tendon Lenghtening and Tendon Achilles Lenghtening       Medication List       This list is accurate as of: 05/09/15 11:59 PM.  Always use your most recent med list.               acetaminophen 325 MG tablet  Commonly known as:  TYLENOL  Take 650 mg by mouth every 4 (four) hours as needed for mild pain.     ALPRAZolam 1 MG tablet  Commonly known as:  XANAX  Take 1 mg by mouth 2 (two) times daily. Take at 0600, 1000, 1400, and 1800.     antiseptic oral rinse Liqd  15 mLs by Mouth Rinse route every 4 (four) hours as needed for dry mouth.     apixaban 5 MG Tabs tablet  Commonly known as:  ELIQUIS  Take 2 tablets (10 mg total) by mouth 2 (two) times daily.     aspirin 81 MG chewable tablet  Chew 81 mg by mouth daily.     bisacodyl 10 MG suppository  Commonly known as:  DULCOLAX  Place 10 mg rectally as directed. On Monday, Wednesday, and Friday and daily as needed if no bowel movement in 2 days.     cephALEXin 500 MG capsule  Commonly known as:  KEFLEX  Take 500 mg by mouth 3 (three)  times daily.     CHLORASEPTIC 6-10 MG lozenge  Generic drug:  benzocaine-menthol  Take 1 lozenge by mouth every 2 (two) hours as needed for sore throat.     esomeprazole 40 MG capsule  Commonly known as:  NEXIUM  Take 40 mg by mouth daily at 12 noon.     fenofibrate 145 MG tablet  Commonly known as:  TRICOR  Take 145 mg by mouth daily.     fish oil-omega-3 fatty acids 1000 MG capsule  Take 1 g by mouth 2 (two) times daily.     Fluticasone-Salmeterol 250-50 MCG/DOSE Aepb  Commonly known as:  ADVAIR DISKUS  Inhale 1 puff into the lungs 2 (two) times daily.     gabapentin 300 MG capsule  Commonly known as:  NEURONTIN  Take 3 capsules (900 mg total) by mouth 3 (three) times daily.     insulin aspart 100 UNIT/ML FlexPen  Commonly known as:  NOVOLOG  Inject 3-5 Units into the skin 3 (three) times daily before meals. Give 3 units before meals with an additional 5 units for CBG> or <  150     insulin glargine 100 UNIT/ML injection  Commonly known as:  LANTUS  Inject 22 Units into the skin at bedtime.     insulin glargine 100 UNIT/ML injection  Commonly known as:  LANTUS  Inject 20 Units into the skin daily.     lamoTRIgine 150 MG tablet  Commonly known as:  LAMICTAL  Take 150 mg by mouth daily.     levalbuterol 1.25 MG/0.5ML nebulizer solution  Commonly known as:  XOPENEX  Take 1.25 mg by nebulization 4 (four) times daily as needed for wheezing or shortness of breath.     levalbuterol 45 MCG/ACT inhaler  Commonly known as:  XOPENEX HFA  Inhale 2 puffs into the lungs every 4 (four) hours as needed for wheezing.     loperamide 2 MG capsule  Commonly known as:  IMODIUM  Take 2 mg by mouth as needed for diarrhea or loose stools.     loratadine 10 MG tablet  Commonly known as:  CLARITIN  Take 10 mg by mouth daily.     LORazepam 2 MG/ML concentrated solution  Commonly known as:  ATIVAN  Take 1 mg by mouth every 12 (twelve) hours as needed for anxiety (and agitation.).     magnesium hydroxide 400 MG/5ML suspension  Commonly known as:  MILK OF MAGNESIA  Take 30 mLs by mouth daily as needed for mild constipation.     metoprolol tartrate 25 MG tablet  Commonly known as:  LOPRESSOR  Take 25 mg by mouth 2 (two) times daily. At 0800 and 1700.     OxyCODONE 10 mg T12a 12 hr tablet  Commonly known as:  OXYCONTIN  Take one tablet by mouth every 12 hours for pain. Do not crush     oxyCODONE-acetaminophen 5-325 MG per tablet  Commonly known as:  PERCOCET/ROXICET  Take 1 tablet by mouth every 4 (four) hours as needed for severe pain.     OXYGEN  Inhale 2 L into the lungs continuous. Via nasal cannula.     polyethylene glycol packet  Commonly known as:  MIRALAX / GLYCOLAX  Take 17 g by mouth daily.     polyvinyl alcohol 1.4 % ophthalmic solution  Commonly known as:  LIQUIFILM TEARS  Place 2 drops into both eyes 4 (four) times daily as needed for dry eyes.      potassium chloride  10 MEQ tablet  Commonly known as:  K-DUR,KLOR-CON  Take 5 mEq by mouth every evening.     promethazine 25 MG tablet  Commonly known as:  PHENERGAN  Take 25 mg by mouth every 6 (six) hours as needed. nausea     QUEtiapine 50 MG tablet  Commonly known as:  SEROQUEL  Take 50 mg by mouth at bedtime. Take along with Seroquel 100mg  to = 150mg  at bedtime.     QUEtiapine 100 MG tablet  Commonly known as:  SEROQUEL  Take 100 mg by mouth at bedtime. Give 1 tablet along with Seroquel 50mg  to = 150mg  at bedtime     REFRESH LACRI-LUBE Oint  Place 1 application into both eyes at bedtime.     risperiDONE 2 MG tablet  Commonly known as:  RISPERDAL  Take 2 mg by mouth 2 (two) times daily.     simvastatin 10 MG tablet  Commonly known as:  ZOCOR  Take 10 mg by mouth daily at 6 PM.     sodium phosphate enema  Commonly known as:  FLEET  Place 1 enema rectally daily as needed (constipation). follow package directions     SYSTANE 0.4-0.3 % Soln  Generic drug:  Polyethyl Glycol-Propyl Glycol  Place 1 drop into both eyes 4 (four) times daily.     tiotropium 18 MCG inhalation capsule  Commonly known as:  SPIRIVA  Place 1 capsule (18 mcg total) into inhaler and inhale daily.     torsemide 20 MG tablet  Commonly known as:  DEMADEX  Take 40 mg by mouth daily.     traZODone 150 MG tablet  Commonly known as:  DESYREL  Take 150 mg by mouth at bedtime.     trimethoprim 100 MG tablet  Commonly known as:  TRIMPEX  Take 100 mg by mouth daily.     UTI-STAT Liqd  Take 30 mLs by mouth daily.     venlafaxine XR 150 MG 24 hr capsule  Commonly known as:  EFFEXOR-XR  Take 300 mg by mouth every morning.        No orders of the defined types were placed in this encounter.    Immunization History  Administered Date(s) Administered  . Influenza Split 10/20/2011  . Influenza Whole 10/05/2007, 09/16/2008, 11/17/2010, 10/01/2013  . Influenza,inj,Quad PF,36+ Mos 09/02/2014  .  Influenza-Unspecified 10/01/2014  . PPD Test 02/16/2012  . Pneumococcal Polysaccharide-23 10/21/2003, 11/17/2010, 01/27/2012  . Td 09/20/2003    History  Substance Use Topics  . Smoking status: Former Smoker -- 1.00 packs/day    Types: Cigarettes    Quit date: 11/14/1986  . Smokeless tobacco: Never Used     Comment: pt cannot recall the total amt of time she smoked.  . Alcohol Use: No    Review of Systems  DATA OBTAINED: from patient, nurse GENERAL:  no fevers, fatigue, appetite changes SKIN: No itching, rash HEENT: No complaint RESPIRATORY: No cough, wheezing, SOB CARDIAC: No chest pain, palpitations, lower extremity edema  GI: No abdominal pain, No N/V/D or constipation, No heartburn or reflux  GU: No dysuria, frequency or urgency, or incontinence  MUSCULOSKELETAL: No unrelieved bone/joint pain NEUROLOGIC: No headache, dizziness  PSYCHIATRIC: No overt anxiety or sadness  Filed Vitals:   05/10/15 2126  BP: 134/81  Pulse: 94  Temp: 97.5 F (36.4 C)  Resp: 18    Physical Exam  GENERAL APPEARANCE: Alert, conversant, No acute distress  SKIN: No diaphoresis rash, or wounds HEENT: Unremarkable RESPIRATORY: Breathing is  even, unlabored. Lung sounds are clear   CARDIOVASCULAR: Heart RRR no murmurs, rubs or gallops. Trace peripheral edema, more to point LLE swelling is gone and pt is wearing TED hose again GASTROINTESTINAL: Abdomen is soft, non-tender, not distended w/ normal bowel sounds.  GENITOURINARY: Bladder non tender, not distended  MUSCULOSKELETAL: No abnormal joints or musculature NEUROLOGIC: Cranial nerves 2-12 grossly intact. PSYCHIATRIC: Mood and affect appropriate to situation, for past week pt's behavoir has been much improved.  Patient Active Problem List   Diagnosis Date Noted  . Psychosis 03/04/2015  . Bipolar depression 03/04/2015  . Candidiasis, intertrigo 02/07/2015  . Lethargy 10/17/2014  . Sore throat 10/08/2014  . Chest congestion 10/08/2014   . Dysphagia, oropharyngeal 09/28/2014  . Neuropathy due to secondary diabetes 09/28/2014  . Esophageal dysphagia 08/09/2014  . Non compliance with medical treatment 08/09/2014  . MRSA cellulitis 02/20/2014  . Tardive dyskinesia 02/10/2014  . Infection due to ESBL-producing Escherichia coli 02/02/2014  . Sepsis 01/21/2014  . Acute respiratory failure with hypoxia 01/21/2014  . Fever, unspecified 01/21/2014  . Septic shock 12/25/2013  . UTI 12/25/2013  . Acute encephalopathy 12/25/2013  . Pneumonia, organism unspecified 12/21/2013  . Cough 12/21/2013  . Diarrhea 12/21/2013  . Diaper candidiasis 12/17/2013  . Dysuria 09/17/2013  . Pure hyperglyceridemia 06/18/2013  . Type 2 diabetes mellitus with neurological manifestations, uncontrolled 05/04/2013  . Chronic pain syndrome 05/04/2013  . Constipation 04/04/2013  . Venous stasis dermatitis 04/03/2012  . COPD exacerbation 02/21/2012  . Tachycardia 12/27/2011  . Thrush 11/10/2011  . Ankle arthritis 07/28/2011  . Chronic right shoulder pain 06/03/2011  . Leg swelling 03/03/2011  . INSOMNIA, CHRONIC 11/17/2010  . Other chronic pain 11/17/2010  . Presence of IVC filter 11/17/2010  . NEOPLASM UNCERTAIN BEHAVIOR MAJOR SALIV GLANDS 11/10/2009  . Personal history of PE (pulmonary embolism) 10/02/2009  . Acute thromboembolism of deep veins of lower extremity 10/02/2009  . Pulmonary nodules 09/12/2009  . Wheelchair dependence 04/10/2009  . COR PULMONALE 05/22/2008  . VENOUS INSUFFICIENCY, CHRONIC 03/25/2008  . SYMPTOM, INCONTINENCE, URINARY NOS 07/13/2007  . Diabetes type 2, controlled 05/11/2007  . Anemia of chronic disease 05/11/2007  . GERD 05/11/2007  . Hyperlipidemia LDL goal <70 02/16/2007  . Obesity, diabetes, and hypertension syndrome 02/16/2007  . BIPOLAR DISORDER 02/16/2007  . Anxiety with obsessional features 02/16/2007  . HYPERTENSION, BENIGN SYSTEMIC 02/16/2007  . COPD (chronic obstructive pulmonary disease) with  emphysema 02/16/2007  . OSTEOPENIA 02/16/2007  . TOBACCO ABUSE, HX OF 02/16/2007    CBC    Component Value Date/Time   WBC 4.6 03/18/2015   WBC 6.1 03/16/2015 1943   RBC 3.88 03/16/2015 1943   HGB 10.9* 03/16/2015 1943   HCT 35.8* 03/16/2015 1943   PLT 233 03/18/2015   MCV 92.3 03/16/2015 1943   LYMPHSABS 1.8 03/16/2015 1943   MONOABS 0.5 03/16/2015 1943   EOSABS 0.3 03/16/2015 1943   BASOSABS 0.0 03/16/2015 1943    CMP     Component Value Date/Time   NA 140 03/18/2015   NA 136 03/16/2015 1943   K 4.2 03/18/2015   CL 92* 03/16/2015 1943   CO2 35* 03/16/2015 1943   GLUCOSE 267* 03/16/2015 1943   BUN 13 03/18/2015   BUN 13 03/16/2015 1943   CREATININE 0.71 03/16/2015 1943   CREATININE 0.73 03/24/2011 1614   CALCIUM 8.7 03/16/2015 1943   PROT 7.6 03/16/2015 1943   ALBUMIN 3.7 03/16/2015 1943   AST 21 03/16/2015 1943   ALT 17 03/16/2015 1943  ALKPHOS 55 03/16/2015 1943   BILITOT 0.5 03/16/2015 1943   GFRNONAA 84* 03/16/2015 1943   GFRAA >90 03/16/2015 1943    Assessment and Plan  Diabetes type 2, controlled 03/2015 A1c 8.0, stable and good considering non compliance with diet.   Anemia of chronic disease 02/2015 BHb 10.8, stable on no supplements   Acute thromboembolism of deep veins of lower extremity Swelling has resolved and pt is back to wearing her TED hose which makes her happy; will cont eilquis min 3 months, prob 6 months.     Hennie Duos, MD

## 2015-05-09 NOTE — Assessment & Plan Note (Signed)
03/2015 A1c 8.0, stable and good considering non compliance with diet.

## 2015-05-09 NOTE — Assessment & Plan Note (Signed)
02/2015 BHb 10.8, stable on no supplements

## 2015-05-09 NOTE — Assessment & Plan Note (Signed)
Swelling has resolved and pt is back to wearing her TED hose which makes her happy; will cont eilquis min 3 months, prob 6 months.

## 2015-05-10 ENCOUNTER — Encounter: Payer: Self-pay | Admitting: Internal Medicine

## 2015-05-25 ENCOUNTER — Encounter (HOSPITAL_COMMUNITY): Payer: Self-pay | Admitting: Emergency Medicine

## 2015-05-25 ENCOUNTER — Emergency Department (HOSPITAL_COMMUNITY): Payer: Medicare Other

## 2015-05-25 ENCOUNTER — Inpatient Hospital Stay (HOSPITAL_COMMUNITY)
Admission: EM | Admit: 2015-05-25 | Discharge: 2015-05-28 | DRG: 690 | Disposition: A | Discharge status: Skilled Nursing Facility | Payer: Medicare Other | Attending: Family Medicine | Admitting: Family Medicine

## 2015-05-25 DIAGNOSIS — Z90710 Acquired absence of both cervix and uterus: Secondary | ICD-10-CM | POA: Diagnosis not present

## 2015-05-25 DIAGNOSIS — E1149 Type 2 diabetes mellitus with other diabetic neurological complication: Secondary | ICD-10-CM | POA: Diagnosis present

## 2015-05-25 DIAGNOSIS — N390 Urinary tract infection, site not specified: Principal | ICD-10-CM | POA: Diagnosis present

## 2015-05-25 DIAGNOSIS — J449 Chronic obstructive pulmonary disease, unspecified: Secondary | ICD-10-CM | POA: Diagnosis present

## 2015-05-25 DIAGNOSIS — R300 Dysuria: Secondary | ICD-10-CM | POA: Diagnosis present

## 2015-05-25 DIAGNOSIS — IMO0002 Reserved for concepts with insufficient information to code with codable children: Secondary | ICD-10-CM | POA: Diagnosis present

## 2015-05-25 DIAGNOSIS — F418 Other specified anxiety disorders: Secondary | ICD-10-CM | POA: Diagnosis present

## 2015-05-25 DIAGNOSIS — E781 Pure hyperglyceridemia: Secondary | ICD-10-CM | POA: Diagnosis present

## 2015-05-25 DIAGNOSIS — B9620 Unspecified Escherichia coli [E. coli] as the cause of diseases classified elsewhere: Secondary | ICD-10-CM | POA: Diagnosis present

## 2015-05-25 DIAGNOSIS — J9610 Chronic respiratory failure, unspecified whether with hypoxia or hypercapnia: Secondary | ICD-10-CM | POA: Diagnosis present

## 2015-05-25 DIAGNOSIS — Z794 Long term (current) use of insulin: Secondary | ICD-10-CM

## 2015-05-25 DIAGNOSIS — Z882 Allergy status to sulfonamides status: Secondary | ICD-10-CM | POA: Diagnosis not present

## 2015-05-25 DIAGNOSIS — E1340 Other specified diabetes mellitus with diabetic neuropathy, unspecified: Secondary | ICD-10-CM | POA: Diagnosis present

## 2015-05-25 DIAGNOSIS — J9611 Chronic respiratory failure with hypoxia: Secondary | ICD-10-CM

## 2015-05-25 DIAGNOSIS — I279 Pulmonary heart disease, unspecified: Secondary | ICD-10-CM | POA: Diagnosis not present

## 2015-05-25 DIAGNOSIS — R000 Tachycardia, unspecified: Secondary | ICD-10-CM | POA: Diagnosis present

## 2015-05-25 DIAGNOSIS — Z823 Family history of stroke: Secondary | ICD-10-CM | POA: Diagnosis not present

## 2015-05-25 DIAGNOSIS — M1990 Unspecified osteoarthritis, unspecified site: Secondary | ICD-10-CM | POA: Diagnosis present

## 2015-05-25 DIAGNOSIS — E785 Hyperlipidemia, unspecified: Secondary | ICD-10-CM | POA: Diagnosis present

## 2015-05-25 DIAGNOSIS — Z789 Other specified health status: Secondary | ICD-10-CM

## 2015-05-25 DIAGNOSIS — M810 Age-related osteoporosis without current pathological fracture: Secondary | ICD-10-CM | POA: Diagnosis present

## 2015-05-25 DIAGNOSIS — Z9981 Dependence on supplemental oxygen: Secondary | ICD-10-CM

## 2015-05-25 DIAGNOSIS — E1165 Type 2 diabetes mellitus with hyperglycemia: Secondary | ICD-10-CM | POA: Diagnosis present

## 2015-05-25 DIAGNOSIS — Z9842 Cataract extraction status, left eye: Secondary | ICD-10-CM | POA: Diagnosis not present

## 2015-05-25 DIAGNOSIS — Z86711 Personal history of pulmonary embolism: Secondary | ICD-10-CM

## 2015-05-25 DIAGNOSIS — Z885 Allergy status to narcotic agent status: Secondary | ICD-10-CM | POA: Diagnosis not present

## 2015-05-25 DIAGNOSIS — Z86718 Personal history of other venous thrombosis and embolism: Secondary | ICD-10-CM

## 2015-05-25 DIAGNOSIS — Z87891 Personal history of nicotine dependence: Secondary | ICD-10-CM

## 2015-05-25 DIAGNOSIS — K219 Gastro-esophageal reflux disease without esophagitis: Secondary | ICD-10-CM | POA: Diagnosis present

## 2015-05-25 DIAGNOSIS — K5900 Constipation, unspecified: Secondary | ICD-10-CM | POA: Diagnosis present

## 2015-05-25 DIAGNOSIS — Z881 Allergy status to other antibiotic agents status: Secondary | ICD-10-CM | POA: Diagnosis not present

## 2015-05-25 DIAGNOSIS — Z8541 Personal history of malignant neoplasm of cervix uteri: Secondary | ICD-10-CM

## 2015-05-25 DIAGNOSIS — Z888 Allergy status to other drugs, medicaments and biological substances status: Secondary | ICD-10-CM

## 2015-05-25 DIAGNOSIS — J438 Other emphysema: Secondary | ICD-10-CM

## 2015-05-25 DIAGNOSIS — Z79899 Other long term (current) drug therapy: Secondary | ICD-10-CM

## 2015-05-25 DIAGNOSIS — Z9841 Cataract extraction status, right eye: Secondary | ICD-10-CM | POA: Diagnosis not present

## 2015-05-25 DIAGNOSIS — B370 Candidal stomatitis: Secondary | ICD-10-CM | POA: Diagnosis present

## 2015-05-25 DIAGNOSIS — Z8614 Personal history of Methicillin resistant Staphylococcus aureus infection: Secondary | ICD-10-CM

## 2015-05-25 DIAGNOSIS — J439 Emphysema, unspecified: Secondary | ICD-10-CM | POA: Diagnosis present

## 2015-05-25 DIAGNOSIS — Z7901 Long term (current) use of anticoagulants: Secondary | ICD-10-CM

## 2015-05-25 DIAGNOSIS — Z22322 Carrier or suspected carrier of Methicillin resistant Staphylococcus aureus: Secondary | ICD-10-CM

## 2015-05-25 DIAGNOSIS — F419 Anxiety disorder, unspecified: Secondary | ICD-10-CM

## 2015-05-25 DIAGNOSIS — Z79891 Long term (current) use of opiate analgesic: Secondary | ICD-10-CM

## 2015-05-25 DIAGNOSIS — E1141 Type 2 diabetes mellitus with diabetic mononeuropathy: Secondary | ICD-10-CM

## 2015-05-25 DIAGNOSIS — I10 Essential (primary) hypertension: Secondary | ICD-10-CM | POA: Diagnosis present

## 2015-05-25 DIAGNOSIS — R0600 Dyspnea, unspecified: Secondary | ICD-10-CM | POA: Diagnosis not present

## 2015-05-25 DIAGNOSIS — F319 Bipolar disorder, unspecified: Secondary | ICD-10-CM | POA: Diagnosis present

## 2015-05-25 DIAGNOSIS — Z8249 Family history of ischemic heart disease and other diseases of the circulatory system: Secondary | ICD-10-CM | POA: Diagnosis not present

## 2015-05-25 DIAGNOSIS — Z7982 Long term (current) use of aspirin: Secondary | ICD-10-CM | POA: Diagnosis not present

## 2015-05-25 DIAGNOSIS — E1140 Type 2 diabetes mellitus with diabetic neuropathy, unspecified: Secondary | ICD-10-CM | POA: Diagnosis present

## 2015-05-25 HISTORY — DX: Carrier or suspected carrier of methicillin resistant Staphylococcus aureus: Z22.322

## 2015-05-25 HISTORY — DX: Sepsis, unspecified organism: A41.9

## 2015-05-25 HISTORY — DX: Severe sepsis with septic shock: R65.21

## 2015-05-25 HISTORY — DX: Acute embolism and thrombosis of unspecified deep veins of unspecified lower extremity: I82.409

## 2015-05-25 LAB — BASIC METABOLIC PANEL
Anion gap: 9 (ref 5–15)
BUN: 12 mg/dL (ref 6–20)
CALCIUM: 8.9 mg/dL (ref 8.9–10.3)
CO2: 32 mmol/L (ref 22–32)
CREATININE: 0.48 mg/dL (ref 0.44–1.00)
Chloride: 98 mmol/L — ABNORMAL LOW (ref 101–111)
GFR calc Af Amer: >60 mL/min (ref >60)
GFR calc non Af Amer: >60 mL/min (ref >60)
Glucose, Bld: 183 mg/dL — ABNORMAL HIGH (ref 65–99)
Potassium: 3.8 mmol/L (ref 3.5–5.1)
Sodium: 139 mmol/L (ref 135–145)

## 2015-05-25 LAB — PROTIME-INR
INR: 1.06 (ref 0.00–1.49)
Prothrombin Time: 14 seconds (ref 11.6–15.2)

## 2015-05-25 LAB — URINALYSIS, ROUTINE W REFLEX MICROSCOPIC
Bilirubin Urine: NEGATIVE
Glucose, UA: 250 mg/dL — AB
KETONES UR: NEGATIVE mg/dL
Nitrite: POSITIVE — AB
PH: 6.5 (ref 5.0–8.0)
Protein, ur: NEGATIVE mg/dL
Specific Gravity, Urine: 1.003 — ABNORMAL LOW (ref 1.005–1.030)
UROBILINOGEN UA: 0.2 mg/dL (ref 0.0–1.0)

## 2015-05-25 LAB — I-STAT CG4 LACTIC ACID, ED
Lactic Acid, Venous: 1.61 mmol/L (ref 0.5–2.0)
Lactic Acid, Venous: 1.79 mmol/L (ref 0.5–2.0)

## 2015-05-25 LAB — TROPONIN I

## 2015-05-25 LAB — CBC
HEMATOCRIT: 38.2 % (ref 36.0–46.0)
Hemoglobin: 11.4 g/dL — ABNORMAL LOW (ref 12.0–15.0)
MCH: 27.4 pg (ref 26.0–34.0)
MCHC: 29.8 g/dL — AB (ref 30.0–36.0)
MCV: 91.8 fL (ref 78.0–100.0)
Platelets: 182 10*3/uL (ref 150–400)
RBC: 4.16 MIL/uL (ref 3.87–5.11)
RDW: 14.9 % (ref 11.5–15.5)
WBC: 14.7 10*3/uL — ABNORMAL HIGH (ref 4.0–10.5)

## 2015-05-25 LAB — GLUCOSE, CAPILLARY: Glucose-Capillary: 204 mg/dL — ABNORMAL HIGH (ref 65–99)

## 2015-05-25 LAB — URINE MICROSCOPIC-ADD ON

## 2015-05-25 LAB — I-STAT TROPONIN, ED: TROPONIN I, POC: 0 ng/mL (ref 0.00–0.08)

## 2015-05-25 LAB — BRAIN NATRIURETIC PEPTIDE: B NATRIURETIC PEPTIDE 5: 62.7 pg/mL (ref 0.0–100.0)

## 2015-05-25 MED ORDER — SODIUM CHLORIDE 0.9 % IV SOLN
500.0000 mg | Freq: Three times a day (TID) | INTRAVENOUS | Status: DC
  Administered 2015-05-25 – 2015-05-28 (×9): 500 mg via INTRAVENOUS
  Filled 2015-05-25 (×10): qty 500

## 2015-05-25 MED ORDER — FUROSEMIDE 10 MG/ML IJ SOLN
40.0000 mg | Freq: Once | INTRAMUSCULAR | Status: AC
  Administered 2015-05-25: 40 mg via INTRAVENOUS
  Filled 2015-05-25: qty 4

## 2015-05-25 MED ORDER — LORATADINE 10 MG PO TABS
10.0000 mg | ORAL_TABLET | Freq: Every morning | ORAL | Status: DC
  Administered 2015-05-26 – 2015-05-28 (×3): 10 mg via ORAL
  Filled 2015-05-25 (×3): qty 1

## 2015-05-25 MED ORDER — APIXABAN 5 MG PO TABS
5.0000 mg | ORAL_TABLET | Freq: Two times a day (BID) | ORAL | Status: DC
  Administered 2015-05-25 – 2015-05-28 (×5): 5 mg via ORAL
  Filled 2015-05-25 (×7): qty 1

## 2015-05-25 MED ORDER — INSULIN GLARGINE 100 UNIT/ML ~~LOC~~ SOLN
22.0000 [IU] | Freq: Every day | SUBCUTANEOUS | Status: DC
  Administered 2015-05-25 – 2015-05-27 (×2): 22 [IU] via SUBCUTANEOUS
  Filled 2015-05-25 (×2): qty 0.22

## 2015-05-25 MED ORDER — VENLAFAXINE HCL ER 150 MG PO CP24
300.0000 mg | ORAL_CAPSULE | Freq: Every day | ORAL | Status: DC
  Administered 2015-05-26 – 2015-05-28 (×3): 300 mg via ORAL
  Filled 2015-05-25 (×3): qty 2

## 2015-05-25 MED ORDER — ALPRAZOLAM 1 MG PO TABS
1.0000 mg | ORAL_TABLET | Freq: Four times a day (QID) | ORAL | Status: DC
  Administered 2015-05-25 – 2015-05-28 (×12): 1 mg via ORAL
  Filled 2015-05-25 (×5): qty 1
  Filled 2015-05-25: qty 2
  Filled 2015-05-25 (×6): qty 1

## 2015-05-25 MED ORDER — SIMVASTATIN 10 MG PO TABS
10.0000 mg | ORAL_TABLET | Freq: Every day | ORAL | Status: DC
  Administered 2015-05-26 – 2015-05-27 (×2): 10 mg via ORAL
  Filled 2015-05-25 (×3): qty 1

## 2015-05-25 MED ORDER — BISACODYL 10 MG RE SUPP: 10.0000 mg | Freq: Every day | RECTAL | Status: DC | PRN

## 2015-05-25 MED ORDER — MOMETASONE FURO-FORMOTEROL FUM 100-5 MCG/ACT IN AERO
2.0000 | INHALATION_SPRAY | Freq: Two times a day (BID) | RESPIRATORY_TRACT | Status: DC
  Administered 2015-05-25 – 2015-05-28 (×3): 2 via RESPIRATORY_TRACT
  Filled 2015-05-25: qty 8.8

## 2015-05-25 MED ORDER — QUETIAPINE FUMARATE 50 MG PO TABS
50.0000 mg | ORAL_TABLET | Freq: Every day | ORAL | Status: DC
  Administered 2015-05-25 – 2015-05-27 (×2): 50 mg via ORAL
  Filled 2015-05-25 (×4): qty 1

## 2015-05-25 MED ORDER — OMEGA-3-ACID ETHYL ESTERS 1 G PO CAPS
1.0000 g | ORAL_CAPSULE | Freq: Two times a day (BID) | ORAL | Status: DC
  Administered 2015-05-25 – 2015-05-27 (×4): 1 g via ORAL
  Filled 2015-05-25 (×7): qty 1

## 2015-05-25 MED ORDER — INSULIN ASPART 100 UNIT/ML ~~LOC~~ SOLN
0.0000 [IU] | Freq: Every day | SUBCUTANEOUS | Status: DC
  Administered 2015-05-25: 2 [IU] via SUBCUTANEOUS

## 2015-05-25 MED ORDER — RISPERIDONE 2 MG PO TABS
2.0000 mg | ORAL_TABLET | Freq: Two times a day (BID) | ORAL | Status: DC
  Administered 2015-05-25 – 2015-05-28 (×5): 2 mg via ORAL
  Filled 2015-05-25 (×7): qty 1

## 2015-05-25 MED ORDER — ALPRAZOLAM 0.5 MG PO TABS
1.0000 mg | ORAL_TABLET | Freq: Once | ORAL | Status: AC
  Administered 2015-05-25: 1 mg via ORAL
  Filled 2015-05-25: qty 2

## 2015-05-25 MED ORDER — SODIUM CHLORIDE 0.9 % IV SOLN: INTRAVENOUS | Status: DC

## 2015-05-25 MED ORDER — BISACODYL 10 MG RE SUPP
10.0000 mg | RECTAL | Status: DC
  Administered 2015-05-26 – 2015-05-28 (×2): 10 mg via RECTAL
  Filled 2015-05-25 (×2): qty 1

## 2015-05-25 MED ORDER — DILTIAZEM HCL 25 MG/5ML IV SOLN
10.0000 mg | Freq: Once | INTRAVENOUS | Status: AC
  Administered 2015-05-25: 10 mg via INTRAVENOUS
  Filled 2015-05-25: qty 5

## 2015-05-25 MED ORDER — LAMOTRIGINE 150 MG PO TABS
150.0000 mg | ORAL_TABLET | Freq: Every morning | ORAL | Status: DC
  Administered 2015-05-26 – 2015-05-28 (×3): 150 mg via ORAL
  Filled 2015-05-25 (×3): qty 1

## 2015-05-25 MED ORDER — OXYCODONE HCL ER 10 MG PO T12A
10.0000 mg | EXTENDED_RELEASE_TABLET | Freq: Two times a day (BID) | ORAL | Status: DC
  Administered 2015-05-25 – 2015-05-28 (×5): 10 mg via ORAL
  Filled 2015-05-25 (×5): qty 1

## 2015-05-25 MED ORDER — LEVALBUTEROL HCL 1.25 MG/0.5ML IN NEBU
1.2500 mg | INHALATION_SOLUTION | Freq: Four times a day (QID) | RESPIRATORY_TRACT | Status: DC | PRN
  Administered 2015-05-25: 1.25 mg via RESPIRATORY_TRACT
  Filled 2015-05-25 (×2): qty 0.5

## 2015-05-25 MED ORDER — INSULIN GLARGINE 100 UNIT/ML ~~LOC~~ SOLN
20.0000 [IU] | Freq: Every day | SUBCUTANEOUS | Status: DC
  Administered 2015-05-26: 20 [IU] via SUBCUTANEOUS
  Filled 2015-05-25 (×2): qty 0.2

## 2015-05-25 MED ORDER — TRAZODONE HCL 150 MG PO TABS
150.0000 mg | ORAL_TABLET | Freq: Every day | ORAL | Status: DC
  Administered 2015-05-25 – 2015-05-27 (×2): 150 mg via ORAL
  Filled 2015-05-25 (×4): qty 1

## 2015-05-25 MED ORDER — TIOTROPIUM BROMIDE MONOHYDRATE 18 MCG IN CAPS
18.0000 ug | ORAL_CAPSULE | Freq: Every day | RESPIRATORY_TRACT | Status: DC
  Administered 2015-05-25 – 2015-05-28 (×3): 18 ug via RESPIRATORY_TRACT
  Filled 2015-05-25: qty 5

## 2015-05-25 MED ORDER — QUETIAPINE FUMARATE 100 MG PO TABS
100.0000 mg | ORAL_TABLET | Freq: Every day | ORAL | Status: DC
  Administered 2015-05-25 – 2015-05-27 (×2): 100 mg via ORAL
  Filled 2015-05-25 (×4): qty 1

## 2015-05-25 MED ORDER — FENOFIBRATE 160 MG PO TABS
160.0000 mg | ORAL_TABLET | Freq: Every day | ORAL | Status: DC
  Administered 2015-05-26 – 2015-05-28 (×3): 160 mg via ORAL
  Filled 2015-05-25 (×3): qty 1

## 2015-05-25 MED ORDER — ALUM & MAG HYDROXIDE-SIMETH 200-200-20 MG/5ML PO SUSP: 30.0000 mL | ORAL | Status: DC | PRN

## 2015-05-25 MED ORDER — METOPROLOL TARTRATE 25 MG PO TABS: 25.0000 mg | ORAL_TABLET | Freq: Two times a day (BID) | ORAL | Status: DC

## 2015-05-25 MED ORDER — ASPIRIN 81 MG PO CHEW
81.0000 mg | CHEWABLE_TABLET | Freq: Every day | ORAL | Status: DC
  Administered 2015-05-26 – 2015-05-28 (×3): 81 mg via ORAL
  Filled 2015-05-25 (×3): qty 1

## 2015-05-25 MED ORDER — OXYCODONE HCL 5 MG PO TABS
10.0000 mg | ORAL_TABLET | Freq: Once | ORAL | Status: AC
  Administered 2015-05-25: 10 mg via ORAL
  Filled 2015-05-25: qty 2

## 2015-05-25 MED ORDER — POLYVINYL ALCOHOL 1.4 % OP SOLN
1.0000 [drp] | Freq: Four times a day (QID) | OPHTHALMIC | Status: DC
  Administered 2015-05-25 – 2015-05-28 (×11): 1 [drp] via OPHTHALMIC
  Filled 2015-05-25: qty 15

## 2015-05-25 MED ORDER — MAGNESIUM HYDROXIDE 400 MG/5ML PO SUSP: 30.0000 mL | Freq: Every day | ORAL | Status: DC | PRN

## 2015-05-25 MED ORDER — TORSEMIDE 20 MG PO TABS
40.0000 mg | ORAL_TABLET | Freq: Every day | ORAL | Status: DC
  Administered 2015-05-26 – 2015-05-28 (×3): 40 mg via ORAL
  Filled 2015-05-25 (×3): qty 2

## 2015-05-25 MED ORDER — ARTIFICIAL TEARS OP OINT
1.0000 "application " | TOPICAL_OINTMENT | Freq: Every day | OPHTHALMIC | Status: DC
  Administered 2015-05-28: 1 via OPHTHALMIC
  Filled 2015-05-25: qty 3.5

## 2015-05-25 MED ORDER — BIOTENE DRY MOUTH MT LIQD: 15.0000 mL | OROMUCOSAL | Status: DC | PRN

## 2015-05-25 MED ORDER — BENZOCAINE-MENTHOL 6-10 MG MT LOZG
1.0000 | LOZENGE | OROMUCOSAL | Status: DC | PRN
  Filled 2015-05-25: qty 18

## 2015-05-25 MED ORDER — POLYETHYLENE GLYCOL 3350 17 G PO PACK
17.0000 g | PACK | Freq: Every day | ORAL | Status: DC
  Administered 2015-05-25 – 2015-05-28 (×4): 17 g via ORAL
  Filled 2015-05-25 (×7): qty 1

## 2015-05-25 MED ORDER — LEVALBUTEROL TARTRATE 45 MCG/ACT IN AERO: 2.0000 | INHALATION_SPRAY | RESPIRATORY_TRACT | Status: DC | PRN

## 2015-05-25 MED ORDER — INSULIN ASPART 100 UNIT/ML ~~LOC~~ SOLN
0.0000 [IU] | Freq: Three times a day (TID) | SUBCUTANEOUS | Status: DC
  Administered 2015-05-26: 5 [IU] via SUBCUTANEOUS
  Administered 2015-05-27: 3 [IU] via SUBCUTANEOUS
  Administered 2015-05-27: 5 [IU] via SUBCUTANEOUS
  Administered 2015-05-27 – 2015-05-28 (×2): 2 [IU] via SUBCUTANEOUS
  Administered 2015-05-28: 3 [IU] via SUBCUTANEOUS

## 2015-05-25 MED ORDER — POLYVINYL ALCOHOL 1.4 % OP SOLN
2.0000 [drp] | Freq: Four times a day (QID) | OPHTHALMIC | Status: DC | PRN
  Filled 2015-05-25: qty 15

## 2015-05-25 MED ORDER — LORAZEPAM 2 MG/ML IJ SOLN
0.5000 mg | Freq: Two times a day (BID) | INTRAMUSCULAR | Status: DC | PRN
  Administered 2015-05-26 – 2015-05-28 (×3): 1 mg via INTRAVENOUS
  Filled 2015-05-25 (×3): qty 1

## 2015-05-25 MED ORDER — SODIUM CHLORIDE 0.9 % IV BOLUS (SEPSIS)
1000.0000 mL | Freq: Once | INTRAVENOUS | Status: AC
  Administered 2015-05-25: 1000 mL via INTRAVENOUS

## 2015-05-25 MED ORDER — POTASSIUM CHLORIDE CRYS ER 10 MEQ PO TBCR
5.0000 meq | EXTENDED_RELEASE_TABLET | Freq: Every evening | ORAL | Status: DC
Start: 2015-05-25 — End: 2015-05-28
  Administered 2015-05-25 – 2015-05-27 (×2): 5 meq via ORAL
  Filled 2015-05-25 (×3): qty 1

## 2015-05-25 MED ORDER — SODIUM CHLORIDE 0.9 % IV BOLUS (SEPSIS)
500.0000 mL | Freq: Once | INTRAVENOUS | Status: AC
  Administered 2015-05-25: 500 mL via INTRAVENOUS

## 2015-05-25 MED ORDER — DILTIAZEM 12 MG/ML ORAL SUSPENSION
30.0000 mg | Freq: Four times a day (QID) | ORAL | Status: DC
Start: 2015-05-25 — End: 2015-05-27
  Administered 2015-05-25 – 2015-05-27 (×8): 30 mg via ORAL
  Filled 2015-05-25 (×11): qty 3

## 2015-05-25 MED ORDER — FLEET ENEMA 7-19 GM/118ML RE ENEM: 1.0000 | ENEMA | Freq: Every day | RECTAL | Status: DC | PRN

## 2015-05-25 MED ORDER — GABAPENTIN 300 MG PO CAPS
900.0000 mg | ORAL_CAPSULE | Freq: Three times a day (TID) | ORAL | Status: DC
  Administered 2015-05-25 – 2015-05-28 (×7): 900 mg via ORAL
  Filled 2015-05-25 (×10): qty 3

## 2015-05-25 MED ORDER — DEXTROSE 5 % IV SOLN
1.0000 g | INTRAVENOUS | Status: DC
  Administered 2015-05-25: 1 g via INTRAVENOUS
  Filled 2015-05-25 (×2): qty 10

## 2015-05-25 MED ORDER — SODIUM CHLORIDE 0.9 % IJ SOLN
3.0000 mL | Freq: Two times a day (BID) | INTRAMUSCULAR | Status: DC
  Administered 2015-05-25 – 2015-05-27 (×3): 3 mL via INTRAVENOUS

## 2015-05-25 MED ORDER — NYSTATIN 100000 UNIT/ML MT SUSP
5.0000 mL | Freq: Four times a day (QID) | OROMUCOSAL | Status: DC
  Administered 2015-05-25 – 2015-05-28 (×10): 500000 [IU] via ORAL
  Filled 2015-05-25 (×14): qty 5

## 2015-05-25 MED ORDER — PANTOPRAZOLE SODIUM 40 MG PO TBEC
40.0000 mg | DELAYED_RELEASE_TABLET | Freq: Every day | ORAL | Status: DC
  Administered 2015-05-26 – 2015-05-28 (×3): 40 mg via ORAL
  Filled 2015-05-25 (×5): qty 1

## 2015-05-25 NOTE — ED Notes (Addendum)
Awake. Verbally responsive. A/O x4. Resp even and unlabored. No audible adventitious breath sounds noted. ABC's intact. Family at bedside. IV saline lock patent and intact infusing NS at 596ml/hr without difficulty.

## 2015-05-25 NOTE — ED Notes (Signed)
Manuela Schwartz, RN at bedside to attempt IV venipuncture via ultrasound.

## 2015-05-25 NOTE — H&P (Signed)
History and Physical:    ARMONEE [name]   MHD:[id number] DOB: 12/16/1941 DOA: 05/25/2015  Referring physician: Dr. Dorie Rank PCP: Cyndee Brightly, MD   Chief Complaint: Sent by SNF for evaluation of tachycardia  History of Present Illness:   [name] is an 74 y.o. 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  The patient denies chest pain, but does report some shortness of breath/wheezing today.  The patient reports that she has had some nausea and vomiting over the past week. She is extremely anxious during my interview with her, repeatedly asking for her medications including Xanax. Upon initial evaluation in the ED, the patient's heart rate is sustaining the 140s, 12-lead EKG shows sinus tachycardia.  ROS:   Constitutional: No fever, + chills;  Appetite diminished; No weight loss, + weight gain, + fatigue.  HEENT: No blurry vision, no diplopia, no pharyngitis, no dysphagia CV: No chest pain, no palpitations, no PND, no orthopnea, no edema.  Resp: + SOB, + cough, no pleuritic pain. GI: + nausea, + vomiting, no diarrhea, no melena, no hematochezia, + constipation, + abdominal pain.  GU: + dysuria, no hematuria, no frequency, no urgency, + urinary retention. MSK: no myalgias, no arthralgias.  Neuro:  No headache, no focal neurological deficits, no history of seizures.  Psych: + depression, + anxiety.  Endo: No heat intolerance, no cold intolerance, no polyuria, no polydipsia  Skin: No rashes, + skin lesions.  Heme: No easy bruising.  Travel history: No recent travel.   Past Medical History:   Past Medical History  Diagnosis Date  . Anxiety   . Arthritis   . COPD (chronic obstructive pulmonary disease)   . GERD (gastroesophageal reflux disease)   . Hyperlipidemia   . Osteoporosis   . DVT (deep venous thrombosis)   . PE (pulmonary thromboembolism)   . Hypertension      PT  DENIES....ON NO  MEDS  . Diabetes mellitus   . Cancer 1969    cervical  . Pneumonia     h/o  . Bronchitis     h/o  . Blood transfusion   . Anemia   . Bipolar affect, depressed   . Acute thromboembolism of deep veins of lower extremity 10/02/2009    Qualifier: Diagnosis of  By: Martinique, Bonnie    . Septic shock 12/25/2013    Past Surgical History:   Past Surgical History  Procedure Laterality Date  . Vena cava filter placement    . 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 Eye surgery      CATARACTS  . Achilles tendon lengthening  11/18/11    and repair w/posterior tibial tendon lengthening; right  foot  . Cholecystectomy    . Abdominal hysterectomy  1969    "womb taken out for cervical cancer"  . Cataract extraction, bilateral  ~ 2008  . Tonsillectomy      "as a child"  . Achilles tendon surgery  11/18/2011    Procedure: ACHILLES TENDON REPAIR;  Surgeon: Wylene Simmer, MD;  Location: [facility];  Service: Orthopedics;  Laterality: Right;  Right Posterior Tibial Tendon Lenghtening and Tendon Achilles Lenghtening     Social History:   History   Social History  . Marital Status: Divorced    Spouse Name: N/A  . Number of Children: N/A  . Years of  Education: N/A   Occupational History  . Not on file.   Social History Main Topics  . Smoking status: Former Smoker -- 1.00 packs/day    Types: Cigarettes    Quit date: 11/14/1986  . Smokeless tobacco: Never Used     Comment: pt cannot recall the total amt of time she smoked.  . Alcohol Use: No  . Drug Use: No  . Sexual Activity: No   Other Topics Concern  . Not on file   Social History Narrative    Family history:   Family History  Problem Relation Age of Onset  . Heart disease Father   . Heart attack Father   . Clotting disorder Father   . Stroke Mother     Allergies   Benzodiazepines; Morphine and related; Olanzapine; and Sulfamethoxazole-trimethoprim  Current Medications:   Prior to Admission  medications   Medication Sig Start Date End Date Taking? Authorizing Provider  acetaminophen (TYLENOL) 325 MG tablet Take 650 mg by mouth every 4 (four) hours as needed for mild pain.   Yes Historical Provider, MD  ALPRAZolam Duanne Moron) 1 MG tablet Take 1 mg by mouth 4 (four) times daily. Take at 0600, 1000, 1400, and 1800. 08/13/14  Yes Mahima Pandey, MD  alum & mag hydroxide-simeth (MAALOX/MYLANTA) 200-200-20 MG/5ML suspension Take 30 mLs by mouth every 4 (four) hours as needed for indigestion.   Yes Historical Provider, MD  antiseptic oral rinse (BIOTENE) LIQD 15 mLs by Mouth Rinse route every 4 (four) hours as needed for dry mouth.   Yes Historical Provider, MD  apixaban (ELIQUIS) 5 MG TABS tablet Take 2 tablets (10 mg total) by mouth 2 (two) times daily. Patient taking differently: Take 5 mg by mouth 2 (two) times daily.  03/16/15  Yes Hyman Bible, PA-C  Artificial Tear Ointment (REFRESH LACRI-LUBE) OINT Place 1 application into both eyes at bedtime.   Yes Historical Provider, MD  aspirin 81 MG chewable tablet Chew 81 mg by mouth daily with breakfast.    Yes Historical Provider, MD  benzocaine-menthol (CHLORASEPTIC) 6-10 MG lozenge Take 1 lozenge by mouth every 2 (two) hours as needed for sore throat.   Yes Historical Provider, MD  bisacodyl (DULCOLAX) 10 MG suppository Place 10 mg rectally as directed. On Monday, Wednesday, and Friday and daily as needed if no bowel movement in 2 days.   Yes Historical Provider, MD  Cranberry-Vitamin C-Inulin (UTI-STAT) LIQD Take 30 mLs by mouth every morning.    Yes Historical Provider, MD  esomeprazole (NEXIUM) 40 MG capsule Take 40 mg by mouth daily before breakfast.    Yes Historical Provider, MD  fenofibrate (TRICOR) 145 MG tablet Take 145 mg by mouth daily.   Yes Historical Provider, MD  fish oil-omega-3 fatty acids 1000 MG capsule Take 1 g by mouth 2 (two) times daily.    Yes Historical Provider, MD  Fluticasone-Salmeterol (ADVAIR DISKUS) 250-50 MCG/DOSE  AEPB Inhale 1 puff into the lungs 2 (two) times daily. 03/11/14  Yes Kathee Delton, MD  gabapentin (NEURONTIN) 300 MG capsule Take 3 capsules (900 mg total) by mouth 3 (three) times daily. 01/01/14  Yes Geradine Girt, DO  insulin aspart (NOVOLOG) 100 UNIT/ML FlexPen Inject 3-5 Units into the skin 3 (three) times daily before meals. Give 3 units before meals with an additional 5 units for CBG> or < 150   Yes Historical Provider, MD  insulin glargine (LANTUS) 100 UNIT/ML injection Inject 20-22 Units into the skin 2 (two) times daily. Takes 20  units in the morning and 22 units at bedtime 01/01/14  Yes Geradine Girt, DO  lamoTRIgine (LAMICTAL) 150 MG tablet Take 150 mg by mouth every morning.    Yes Historical Provider, MD  levalbuterol [facility] HFA) 45 MCG/ACT inhaler Inhale 2 puffs into the lungs every 4 (four) hours as needed for wheezing.    Yes Historical Provider, MD  levalbuterol (XOPENEX) 1.25 MG/0.5ML nebulizer solution Take 1.25 mg by nebulization 4 (four) times daily as needed for wheezing or shortness of breath. 01/01/14  Yes Geradine Girt, DO  loperamide (IMODIUM) 2 MG capsule Take 2 mg by mouth as needed for diarrhea or loose stools.   Yes Historical Provider, MD  loratadine (CLARITIN) 10 MG tablet Take 10 mg by mouth every morning.    Yes Historical Provider, MD  LORazepam (ATIVAN) 2 MG/ML injection Inject 0.5-1 mg into the vein every 12 (twelve) hours as needed (for severe agaitation).   Yes Historical Provider, MD  magnesium hydroxide (MILK OF MAGNESIA) 400 MG/5ML suspension Take 30 mLs by mouth daily as needed for mild constipation.   Yes Historical Provider, MD  metoprolol tartrate (LOPRESSOR) 25 MG tablet Take 25 mg by mouth 2 (two) times daily. At 0800 and 1700. 01/01/14  Yes Geradine Girt, DO  OxyCODONE (OXYCONTIN) 10 mg T12A 12 hr tablet Take one tablet by mouth every 12 hours for pain. Do not crush 02/19/15  Yes Gildardo Cranker, DO  OXYGEN Inhale 2 L into the lungs continuous. Via nasal  cannula.   Yes Historical Provider, MD  Polyethyl Glycol-Propyl Glycol (SYSTANE) 0.4-0.3 % SOLN Place 1 drop into both eyes 4 (four) times daily.   Yes Historical Provider, MD  polyethylene glycol (MIRALAX / GLYCOLAX) packet Take 17 g by mouth daily.    Yes Historical Provider, MD  polyvinyl alcohol (LIQUIFILM TEARS) 1.4 % ophthalmic solution Place 2 drops into both eyes 4 (four) times daily as needed for dry eyes.   Yes Historical Provider, MD  potassium chloride (K-DUR,KLOR-CON) 10 MEQ tablet Take 5 mEq by mouth every evening.  08/25/11  Yes Judithann Sheen, MD  QUEtiapine (SEROQUEL) 100 MG tablet Take 100 mg by mouth at bedtime. Give 1 tablet along with Seroquel 50mg  to = 150mg  at bedtime   Yes Historical Provider, MD  QUEtiapine (SEROQUEL) 50 MG tablet Take 50 mg by mouth at bedtime. Take along with Seroquel 100mg  to = 150mg  at bedtime.   Yes Historical Provider, MD  risperiDONE (RISPERDAL) 2 MG tablet Take 2 mg by mouth 2 (two) times daily.   Yes Historical Provider, MD  simvastatin (ZOCOR) 10 MG tablet Take 10 mg by mouth daily at 6 PM.    Yes Historical Provider, MD  sodium phosphate (FLEET) enema Place 1 enema rectally daily as needed (constipation). follow package directions   Yes Historical Provider, MD  tiotropium (SPIRIVA) 18 MCG inhalation capsule Place 1 capsule (18 mcg total) into inhaler and inhale daily. 08/25/11  Yes Judithann Sheen, MD  torsemide (DEMADEX) 20 MG tablet Take 40 mg by mouth daily with breakfast.    Yes Historical Provider, MD  traZODone (DESYREL) 150 MG tablet Take 150 mg by mouth at bedtime.   Yes Historical Provider, MD  trimethoprim (TRIMPEX) 100 MG tablet Take 100 mg by mouth daily with breakfast.    Yes Historical Provider, MD  venlafaxine XR (EFFEXOR-XR) 150 MG 24 hr capsule Take 300 mg by mouth every morning.  01/31/12  Yes Waldemar Dickens, MD  Physical Exam:   Filed Vitals:   05/25/15 1200 05/25/15 1300 05/25/15 1353 05/25/15 1400  BP:   144/79 152/70    Pulse: 130 133 128 136  Temp:      TempSrc:      Resp: 15 22 18 19   SpO2: 97% 96% 95% 96%     Physical Exam: Blood pressure 152/70, pulse 136, temperature 98.7 F (37.1 C), temperature source Oral, resp. rate 19, SpO2 96 %. Gen: Extremely anxious. Head: Normocephalic, atraumatic. Eyes: PERRL, EOMI, sclerae nonicteric. Mouth: Oropharynx clear, thrush on tongue.  Tardive dyskinesia present. Neck: Supple, no thyromegaly, no lymphadenopathy, no jugular venous distention. Chest: Lungs diminished at the bases. CV: Heart sounds are tachycardic, regular. No murmurs, rubs, or gallops. Abdomen: Softly distended with normal active bowel sounds. Extremities: Extremities are with 2+ edema bilaterally. Skin: Warm and dry. Neuro: Alert, nonfocal. Psych: Mood and affect very anxious.   Data Review:    Labs: Basic Metabolic Panel:  Recent Labs Lab 05/25/15 1128  NA 139  K 3.8  CL 98*  CO2 32  GLUCOSE 183*  BUN 12  CREATININE 0.48  CALCIUM 8.9   Liver Function Tests: No results for input(s): AST, ALT, ALKPHOS, BILITOT, PROT, ALBUMIN in the last 168 hours. No results for input(s): LIPASE, AMYLASE in the last 168 hours. No results for input(s): AMMONIA in the last 168 hours. CBC:  Recent Labs Lab 05/25/15 1128  WBC 14.7*  HGB 11.4*  HCT 38.2  MCV 91.8  PLT 182   Cardiac Enzymes: No results for input(s): CKTOTAL, CKMB, CKMBINDEX, TROPONINI in the last 168 hours.  BNP (last 3 results)  Recent Labs  09/12/14 1558  PROBNP 449.7*   BNP    Component Value Date/Time   BNP 62.7 05/25/2015 1421    ProBNP    Component Value Date/Time   PROBNP 449.7* 09/12/2014 1558     CBG: No results for input(s): GLUCAP in the last 168 hours.  Radiographic Studies: Dg Chest Port 1 View  05/25/2015   CLINICAL DATA:  Tachycardia.  EXAM: PORTABLE CHEST - 1 VIEW  COMPARISON:  Two-view chest x-ray 09/12/2014  FINDINGS: The heart size is normal. The diffuse interstitial pattern is  increased. Mild pulmonary vascular congestion is noted. Mild left basilar airspace opacity is noted. Degenerative changes are again seen in the shoulders, worse on the right.  IMPRESSION: 1. Increased diffuse interstitial pattern suggesting edema and mild pulmonary vascular congestion. 2. Asymmetric left basilar airspace disease. While this likely reflects atelectasis, early infection is also considered.   Electronically Signed   By: San Morelle M.D.   On: 05/25/2015 12:58   *I have personally reviewed the images above*  EKG: Independently reviewed. Sinus tachycardia at 122 bpm. T-wave flattening in the anterior leads. No significant change from prior.   Assessment/Plan:   Principal Problem:   Tachycardia / rule out congestive heart failure - Suspect this is from CHF. Last echo done 04/03/12, EF 55-60 percent. - Chest x-ray with findings concerning for congestive heart failure. - BNP WNL at 62.7. - We'll give 40 mg of Lasix 1 and obtain repeat echocardiogram. Continue daily Demadex. - We'll place on 30 mg of Cardizem every 6 hours to help with rate control. - Cycle troponins. - Check TSH.  Active Problems:   Polypharmacy - Patient is on a large amount of medications. - She is very resistant for me to change any of her medications, particularly her psychoactive medications.    Hyperlipidemia LDL goal <70 /  hypertriglyceridemia - Continue TriCor, fish oil supplement and Zocor.    BIPOLAR DISORDER / anxiety with obsessional features - Continue Xanax, Lamictal, Seroquel, Risperdal, trazodone, Effexor and IV Ativan as needed.    HYPERTENSION, BENIGN SYSTEMIC - Continue metoprolol and Demadex.    Personal history of PE (pulmonary embolism) - Continue Eliquis. The patient has an IVC filter.    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    GERD - Continue  PPI.    Thrush - Nystatin ordered.    Constipation - Continue bowel regimen with Dulcolax suppositories, MiraLAX, MOM/Fleet enema as needed.    Type 2 diabetes mellitus with neurological manifestations, uncontrolled - Continue Lantus 20 units every morning, 22 units every afternoon. - At SSI, moderate scale before meals and at bedtime. - Check hemoglobin A1c.    Dysuria/UTI - History of MRSA and ESBL infections in the past. - Urinalysis positive for nitrites and leukocytes. Patient complains of dysuria. - We'll give Primaxin and follow-up on urine culture results.    Neuropathy due to secondary diabetes - Continue Neurontin.    Chronic respiratory failure - Continue supplemental oxygen.    DVT prophylaxis - On chronic anticoagulation.  Code Status: Full, discussed with patient and her daughter. Family Communication: Daughter, [name] at the bedside.  [phone]) Disposition Plan: From [facility].  Time spent: 70 minutes.  RAMA,CHRISTINA [facility] Pager [phone] Cell: [phone]   If 7PM-7AM, please contact night-coverage [email or web address] Password [facility] 05/25/2015, 5:24 PM

## 2015-05-25 NOTE — ED Notes (Signed)
Awake. Verbally responsive. A/O x4. Resp even and unlabored. No audible adventitious breath sounds noted. ABC's intact. SR on monitor. Pt easily agitated over medication and requires reassurance. IV saline lock patent and intact.

## 2015-05-25 NOTE — ED Notes (Signed)
Attempted IV venipuncture and obtain blood specimen without success x1 stick. Pt tolerated well.

## 2015-05-25 NOTE — ED Notes (Signed)
Pt keeps saying she needs her meds repeatedly. I have made pt aware that the nurse knows about the meds but the patient does not seem to understand.

## 2015-05-25 NOTE — Progress Notes (Signed)
ANTIBIOTIC CONSULT NOTE - INITIAL  Pharmacy Consult for Primaxin Indication: UTI (Hx ESBL)  Allergies  Allergen Reactions  . Benzodiazepines Other (See Comments)    Mood dysregulation  (on MAR)  . Morphine And Related Other (See Comments)    Pain dysregulation Mood disruption  . Olanzapine     Hallucinations and disorientation  . Sulfamethoxazole-Trimethoprim Hives    Patient Measurements:    Vital Signs: Temp: 98.7 F (37.1 C) (06/05 1026) Temp Source: Oral (06/05 1026) BP: 157/81 mmHg (06/05 1900) Pulse Rate: 135 (06/05 1800) Intake/Output from previous day:   Intake/Output from this shift:    Labs:  Recent Labs  05/25/15 1128  WBC 14.7*  HGB 11.4*  PLT 182  CREATININE 0.48   CrCl cannot be calculated (Unknown ideal weight.). No results for input(s): VANCOTROUGH, VANCOPEAK, VANCORANDOM, GENTTROUGH, GENTPEAK, GENTRANDOM, TOBRATROUGH, TOBRAPEAK, TOBRARND, AMIKACINPEAK, AMIKACINTROU, AMIKACIN in the last 72 hours.   Microbiology: No results found for this or any previous visit (from the past 720 hour(s)).  Medical History: Past Medical History  Diagnosis Date  . Anxiety   . Arthritis   . COPD (chronic obstructive pulmonary disease)   . GERD (gastroesophageal reflux disease)   . Hyperlipidemia   . Osteoporosis   . DVT (deep venous thrombosis)   . PE (pulmonary thromboembolism)   . Hypertension      PT DENIES....ON NO  MEDS  . Diabetes mellitus   . Cancer 1969    cervical  . Pneumonia     h/o  . Bronchitis     h/o  . Blood transfusion   . Anemia   . Bipolar affect, depressed   . Acute thromboembolism of deep veins of lower extremity 10/02/2009    Qualifier: Diagnosis of  By: Martinique, Bonnie    . Septic shock 12/25/2013    Medications:  Anti-infectives    Start     Dose/Rate Route Frequency Ordered Stop   05/25/15 1630  cefTRIAXone (ROCEPHIN) 1 g in dextrose 5 % 50 mL IVPB     1 g 100 mL/hr over 30 Minutes Intravenous Every 24 hours 05/25/15  1621       Assessment: 74 y.o. female admitted 05/25/2015 for tachycardia, SOB.  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

## 2015-05-25 NOTE — ED Notes (Signed)
Awake. Verbally responsive. A/O x4. Resp even and unlabored. No audible adventitious breath sounds noted. ABC's intact. SR on monitor. IV saline lock patent and intact. Pt offered supper and pt refused.

## 2015-05-25 NOTE — ED Notes (Signed)
Pt had episode of urinary incontinence. All new linen applied and pericare performed with incontinent cleanser. Pt repositioned in bed.

## 2015-05-25 NOTE — ED Notes (Addendum)
Pt arrived via EMS from [facility] facility with report of tachycardia at 130 bpm. Pt denies chest pain at this time. Pt reported having SOB and occ productive cough of greenish sputum. Auscultated diminish breath sounds in bil lower lobes. Resp even and slightly labored. Pt wears O2 at 2 lpm via [place] at all times.

## 2015-05-25 NOTE — ED Notes (Signed)
IV team at bedside to attempt venipuncture and obtain blood specimen.

## 2015-05-25 NOTE — ED Notes (Signed)
Awake. Verbally responsive. A/O x4. Resp even and unlabored. No audible adventitious breath sounds noted. ABC's intact. SR on monitor. Family at bedside. 

## 2015-05-25 NOTE — ED Notes (Signed)
Awake. Verbally responsive. A/O x4. Resp even and unlabored. No audible adventitious breath sounds noted. ABC's intact. ST on monitor.  Pt denies chest pain.

## 2015-05-25 NOTE — ED Notes (Signed)
Awake. Verbally responsive. A/O x4. Resp even and unlabored. No audible adventitious breath sounds noted. ABC's intact. ST on monitor.

## 2015-05-25 NOTE — ED Notes (Signed)
Bed: WA04 Expected date:  Expected time:  Means of arrival:  Comments: RM 4  Increased heart rate

## 2015-05-25 NOTE — ED Notes (Addendum)
Awake. Verbally responsive. A/O x4. Resp even and unlabored. No audible adventitious breath sounds noted. ABC's intact. ST on monitor. IV saline lock patent and intact. Family at bedside.

## 2015-05-25 NOTE — ED Provider Notes (Signed)
CSN: [id number]     Arrival date & time 05/25/15  1024 History   First MD Initiated Contact with Patient 05/25/15 1036     Chief Complaint  Patient presents with  . Tachycardia   HPI Patient presents to the emergency room for evaluation of tachycardia. The patient is a resident of a nursing facility. Patient does have a history of multiple medical problems including COPD, DVT and PE.    Patient states she was sent into the nursing facility for rapid heart rate. Patient states she's had some trouble with an occasional cough and some mild shortness of breath. She denies any trouble with any chest pain. She denies any trouble with fevers or chills. No vomiting or diarrhea.  No dysuria. Past Medical History  Diagnosis Date  . Anxiety   . Arthritis   . COPD (chronic obstructive pulmonary disease)   . GERD (gastroesophageal reflux disease)   . Hyperlipidemia   . Osteoporosis   . DVT (deep venous thrombosis)   . PE (pulmonary thromboembolism)   . Hypertension      PT DENIES....ON NO  MEDS  . Diabetes mellitus   . Cancer 1969    cervical  . Pneumonia     h/o  . Bronchitis     h/o  . Blood transfusion   . Anemia   . Bipolar affect, depressed    Past Surgical History  Procedure Laterality Date  . Vena cava filter placement    . 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 Eye surgery      CATARACTS  . Achilles tendon lengthening  11/18/11    and repair w/posterior tibial tendon lengthening; right  foot  . Cholecystectomy    . Abdominal hysterectomy  1969    "womb taken out for cervical cancer"  . Cataract extraction, bilateral  ~ 2008  . Tonsillectomy      "as a child"  . Achilles tendon surgery  11/18/2011    Procedure: ACHILLES TENDON REPAIR;  Surgeon: Wylene Simmer, MD;  Location: [facility];  Service: Orthopedics;  Laterality: Right;  Right Posterior Tibial Tendon Lenghtening and Tendon Achilles Lenghtening    Family History  Problem Relation Age of Onset  . Heart disease Father    . Heart attack Father   . Clotting disorder Father   . Stroke Mother    History  Substance Use Topics  . Smoking status: Former Smoker -- 1.00 packs/day    Types: Cigarettes    Quit date: 11/14/1986  . Smokeless tobacco: Never Used     Comment: pt cannot recall the total amt of time she smoked.  . Alcohol Use: No   OB History    No data available     Review of Systems  All other systems reviewed and are negative.     Allergies  Benzodiazepines; Morphine and related; Olanzapine; and Sulfamethoxazole-trimethoprim  Home Medications   Prior to Admission medications   Medication Sig Start Date End Date Taking? Authorizing Provider  acetaminophen (TYLENOL) 325 MG tablet Take 650 mg by mouth every 4 (four) hours as needed for mild pain.   Yes Historical Provider, MD  ALPRAZolam Duanne Moron) 1 MG tablet Take 1 mg by mouth 4 (four) times daily. Take at 0600, 1000, 1400, and 1800. 08/13/14  Yes Mahima Pandey, MD  alum & mag hydroxide-simeth (MAALOX/MYLANTA) 200-200-20 MG/5ML suspension Take 30 mLs by mouth every 4 (four) hours as needed for indigestion.   Yes Historical Provider,  MD  antiseptic oral rinse (BIOTENE) LIQD 15 mLs by Mouth Rinse route every 4 (four) hours as needed for dry mouth.   Yes Historical Provider, MD  apixaban (ELIQUIS) 5 MG TABS tablet Take 2 tablets (10 mg total) by mouth 2 (two) times daily. Patient taking differently: Take 5 mg by mouth 2 (two) times daily.  03/16/15  Yes Hyman Bible, PA-C  Artificial Tear Ointment (REFRESH LACRI-LUBE) OINT Place 1 application into both eyes at bedtime.   Yes Historical Provider, MD  aspirin 81 MG chewable tablet Chew 81 mg by mouth daily with breakfast.    Yes Historical Provider, MD  benzocaine-menthol (CHLORASEPTIC) 6-10 MG lozenge Take 1 lozenge by mouth every 2 (two) hours as needed for sore throat.   Yes Historical Provider, MD  bisacodyl (DULCOLAX) 10 MG suppository Place 10 mg rectally as directed. On Monday, Wednesday,  and Friday and daily as needed if no bowel movement in 2 days.   Yes Historical Provider, MD  Cranberry-Vitamin C-Inulin (UTI-STAT) LIQD Take 30 mLs by mouth every morning.    Yes Historical Provider, MD  esomeprazole (NEXIUM) 40 MG capsule Take 40 mg by mouth daily before breakfast.    Yes Historical Provider, MD  fenofibrate (TRICOR) 145 MG tablet Take 145 mg by mouth daily.   Yes Historical Provider, MD  fish oil-omega-3 fatty acids 1000 MG capsule Take 1 g by mouth 2 (two) times daily.    Yes Historical Provider, MD  Fluticasone-Salmeterol (ADVAIR DISKUS) 250-50 MCG/DOSE AEPB Inhale 1 puff into the lungs 2 (two) times daily. 03/11/14  Yes Kathee Delton, MD  gabapentin (NEURONTIN) 300 MG capsule Take 3 capsules (900 mg total) by mouth 3 (three) times daily. 01/01/14  Yes Geradine Girt, DO  insulin aspart (NOVOLOG) 100 UNIT/ML FlexPen Inject 3-5 Units into the skin 3 (three) times daily before meals. Give 3 units before meals with an additional 5 units for CBG> or < 150   Yes Historical Provider, MD  insulin glargine (LANTUS) 100 UNIT/ML injection Inject 20-22 Units into the skin 2 (two) times daily. Takes 20 units in the morning and 22 units at bedtime 01/01/14  Yes Geradine Girt, DO  lamoTRIgine (LAMICTAL) 150 MG tablet Take 150 mg by mouth every morning.    Yes Historical Provider, MD  levalbuterol [facility] HFA) 45 MCG/ACT inhaler Inhale 2 puffs into the lungs every 4 (four) hours as needed for wheezing.    Yes Historical Provider, MD  levalbuterol (XOPENEX) 1.25 MG/0.5ML nebulizer solution Take 1.25 mg by nebulization 4 (four) times daily as needed for wheezing or shortness of breath. 01/01/14  Yes Geradine Girt, DO  loperamide (IMODIUM) 2 MG capsule Take 2 mg by mouth as needed for diarrhea or loose stools.   Yes Historical Provider, MD  loratadine (CLARITIN) 10 MG tablet Take 10 mg by mouth every morning.    Yes Historical Provider, MD  LORazepam (ATIVAN) 2 MG/ML injection Inject 0.5-1 mg into  the vein every 12 (twelve) hours as needed (for severe agaitation).   Yes Historical Provider, MD  magnesium hydroxide (MILK OF MAGNESIA) 400 MG/5ML suspension Take 30 mLs by mouth daily as needed for mild constipation.   Yes Historical Provider, MD  metoprolol tartrate (LOPRESSOR) 25 MG tablet Take 25 mg by mouth 2 (two) times daily. At 0800 and 1700. 01/01/14  Yes Geradine Girt, DO  OxyCODONE (OXYCONTIN) 10 mg T12A 12 hr tablet Take one tablet by mouth every 12 hours for pain. Do not  crush 02/19/15  Yes Gildardo Cranker, DO  OXYGEN Inhale 2 L into the lungs continuous. Via nasal cannula.   Yes Historical Provider, MD  Polyethyl Glycol-Propyl Glycol (SYSTANE) 0.4-0.3 % SOLN Place 1 drop into both eyes 4 (four) times daily.   Yes Historical Provider, MD  polyethylene glycol (MIRALAX / GLYCOLAX) packet Take 17 g by mouth daily.    Yes Historical Provider, MD  polyvinyl alcohol (LIQUIFILM TEARS) 1.4 % ophthalmic solution Place 2 drops into both eyes 4 (four) times daily as needed for dry eyes.   Yes Historical Provider, MD  potassium chloride (K-DUR,KLOR-CON) 10 MEQ tablet Take 5 mEq by mouth every evening.  08/25/11  Yes Judithann Sheen, MD  QUEtiapine (SEROQUEL) 100 MG tablet Take 100 mg by mouth at bedtime. Give 1 tablet along with Seroquel 50mg  to = 150mg  at bedtime   Yes Historical Provider, MD  QUEtiapine (SEROQUEL) 50 MG tablet Take 50 mg by mouth at bedtime. Take along with Seroquel 100mg  to = 150mg  at bedtime.   Yes Historical Provider, MD  risperiDONE (RISPERDAL) 2 MG tablet Take 2 mg by mouth 2 (two) times daily.   Yes Historical Provider, MD  simvastatin (ZOCOR) 10 MG tablet Take 10 mg by mouth daily at 6 PM.    Yes Historical Provider, MD  sodium phosphate (FLEET) enema Place 1 enema rectally daily as needed (constipation). follow package directions   Yes Historical Provider, MD  tiotropium (SPIRIVA) 18 MCG inhalation capsule Place 1 capsule (18 mcg total) into inhaler and inhale daily. 08/25/11   Yes Judithann Sheen, MD  torsemide (DEMADEX) 20 MG tablet Take 40 mg by mouth daily with breakfast.    Yes Historical Provider, MD  traZODone (DESYREL) 150 MG tablet Take 150 mg by mouth at bedtime.   Yes Historical Provider, MD  trimethoprim (TRIMPEX) 100 MG tablet Take 100 mg by mouth daily with breakfast.    Yes Historical Provider, MD  venlafaxine XR (EFFEXOR-XR) 150 MG 24 hr capsule Take 300 mg by mouth every morning.  01/31/12  Yes Waldemar Dickens, MD   BP 152/70 mmHg  Pulse 136  Temp(Src) 98.7 F (37.1 C) (Oral)  Resp 19  SpO2 96% Physical Exam  Constitutional: No distress.  HENT:  Head: Normocephalic and atraumatic.  Right Ear: External ear normal.  Left Ear: External ear normal.  Eyes: Conjunctivae are normal. Right eye exhibits no discharge. Left eye exhibits no discharge. No scleral icterus.  Neck: Neck supple. No tracheal deviation present.  Torticollis   Cardiovascular: Normal rate, regular rhythm and intact distal pulses.   Pulmonary/Chest: Effort normal and breath sounds normal. No stridor. No respiratory distress. She has no wheezes. She has no rales.  Abdominal: Soft. Bowel sounds are normal. She exhibits no distension. There is no tenderness. There is no rebound and no guarding.  protuberant  Musculoskeletal: She exhibits edema (bilateral, no erythema). She exhibits no tenderness.  Neurological: She is alert. She has normal strength. No cranial nerve deficit (no facial droop, extraocular movements intact, no slurred speech) or sensory deficit. She exhibits normal muscle tone. She displays no seizure activity. Coordination normal.  Able to move all extremities but generalized weakness  Skin: Skin is warm and dry. No rash noted. She is not diaphoretic.  Psychiatric: She has a normal mood and affect.  Nursing note and vitals reviewed.   ED Course  Procedures (including critical care time) Labs Review Labs Reviewed  BASIC METABOLIC PANEL - Abnormal; Notable for the  following:  Chloride 98 (*)    Glucose, Bld 183 (*)    All other components within normal limits  CBC - Abnormal; Notable for the following:    WBC 14.7 (*)    Hemoglobin 11.4 (*)    MCHC 29.8 (*)    All other components within normal limits  URINALYSIS, ROUTINE W REFLEX MICROSCOPIC (NOT AT [facility]) - Abnormal; Notable for the following:    Specific Gravity, Urine 1.003 (*)    Glucose, UA 250 (*)    Hgb urine dipstick TRACE (*)    Nitrite POSITIVE (*)    Leukocytes, UA MODERATE (*)    All other components within normal limits  URINE MICROSCOPIC-ADD ON - Abnormal; Notable for the following:    Bacteria, UA MANY (*)    All other components within normal limits  PROTIME-INR  BRAIN NATRIURETIC PEPTIDE  I-STAT TROPOININ, ED  I-STAT CG4 LACTIC ACID, ED  I-STAT CG4 LACTIC ACID, ED    Imaging Review Dg Chest Port 1 View  05/25/2015   CLINICAL DATA:  Tachycardia.  EXAM: PORTABLE CHEST - 1 VIEW  COMPARISON:  Two-view chest x-ray 09/12/2014  FINDINGS: The heart size is normal. The diffuse interstitial pattern is increased. Mild pulmonary vascular congestion is noted. Mild left basilar airspace opacity is noted. Degenerative changes are again seen in the shoulders, worse on the right.  IMPRESSION: 1. Increased diffuse interstitial pattern suggesting edema and mild pulmonary vascular congestion. 2. Asymmetric left basilar airspace disease. While this likely reflects atelectasis, early infection is also considered.   Electronically Signed   By: San Morelle M.D.   On: 05/25/2015 12:58     EKG Interpretation   Date/Time:  Sunday May 25 2015 11:12:06 EDT Ventricular Rate:  122 PR Interval:  162 QRS Duration: 73 QT Interval:  308 QTC Calculation: 439 R Axis:   18 Text Interpretation:  Sinus tachycardia Low voltage, precordial leads  Borderline T abnormalities, anterior leads No significant change since  last tracing Confirmed by Nailyn Dearinger  MD-J, Canuto Kingston ([id number]) on 05/25/2015 11:15:46 AM      Medications  sodium chloride 0.9 % bolus 1,000 mL (not administered)  cefTRIAXone (ROCEPHIN) 1 g in dextrose 5 % 50 mL IVPB (not administered)  oxyCODONE (Oxy IR/ROXICODONE) immediate release tablet 10 mg (not administered)  sodium chloride 0.9 % bolus 500 mL (500 mLs Intravenous New Bag/Given 05/25/15 1428)  ALPRAZolam (XANAX) tablet 1 mg (1 mg Oral Given 05/25/15 1419)  diltiazem (CARDIZEM) injection 10 mg (10 mg Intravenous Given 05/25/15 1609)    MDM   Final diagnoses:  UTI (lower urinary tract infection)  Sinus tachycardia   Patient's EKG is consistent with sinus tachycardia. This may be related to the patient's urinary tract infection. Blood pressures otherwise normal. Lactic acid level was not elevated, arguing against sepsis.  We'll start IV anabiotic's, fluid resuscitation. Consult with medical service for further observation and monitoring.   Dorie Rank, MD 05/25/15 [phone]

## 2015-05-26 ENCOUNTER — Inpatient Hospital Stay (HOSPITAL_COMMUNITY): Payer: Medicare Other

## 2015-05-26 DIAGNOSIS — R0600 Dyspnea, unspecified: Secondary | ICD-10-CM

## 2015-05-26 DIAGNOSIS — B370 Candidal stomatitis: Secondary | ICD-10-CM

## 2015-05-26 DIAGNOSIS — F319 Bipolar disorder, unspecified: Secondary | ICD-10-CM

## 2015-05-26 LAB — GLUCOSE, CAPILLARY
GLUCOSE-CAPILLARY: 157 mg/dL — AB (ref 65–99)
Glucose-Capillary: 182 mg/dL — ABNORMAL HIGH (ref 65–99)
Glucose-Capillary: 231 mg/dL — ABNORMAL HIGH (ref 65–99)

## 2015-05-26 LAB — TSH: TSH: 2.355 u[IU]/mL (ref 0.350–4.500)

## 2015-05-26 LAB — TROPONIN I: Troponin I: <0.03 ng/mL (ref <0.031)

## 2015-05-26 LAB — MRSA PCR SCREENING: MRSA by PCR: POSITIVE — AB

## 2015-05-26 MED ORDER — METOPROLOL TARTRATE 50 MG PO TABS
50.0000 mg | ORAL_TABLET | Freq: Two times a day (BID) | ORAL | Status: DC
  Administered 2015-05-26 – 2015-05-28 (×4): 50 mg via ORAL
  Filled 2015-05-26 (×5): qty 1

## 2015-05-26 MED ORDER — CHLORHEXIDINE GLUCONATE CLOTH 2 % EX PADS
6.0000 | MEDICATED_PAD | Freq: Every day | CUTANEOUS | Status: DC
  Administered 2015-05-26 – 2015-05-28 (×3): 6 via TOPICAL

## 2015-05-26 MED ORDER — KETOROLAC TROMETHAMINE 30 MG/ML IJ SOLN
30.0000 mg | Freq: Four times a day (QID) | INTRAMUSCULAR | Status: DC | PRN
  Administered 2015-05-26 – 2015-05-28 (×3): 30 mg via INTRAVENOUS
  Filled 2015-05-26 (×3): qty 1

## 2015-05-26 MED ORDER — MUPIROCIN 2 % EX OINT
1.0000 "application " | TOPICAL_OINTMENT | Freq: Two times a day (BID) | CUTANEOUS | Status: DC
  Administered 2015-05-26 – 2015-05-28 (×6): 1 via NASAL
  Filled 2015-05-26: qty 22

## 2015-05-26 MED ORDER — PERFLUTREN LIPID MICROSPHERE
1.0000 mL | INTRAVENOUS | Status: AC | PRN
  Administered 2015-05-26: 2 mL via INTRAVENOUS
  Filled 2015-05-26: qty 10

## 2015-05-26 NOTE — Plan of Care (Signed)
Problem: Phase I Progression Outcomes Goal: Voiding-avoid urinary catheter unless indicated Outcome: Not Applicable Date Met:  48/25/00 Foley catheter in place

## 2015-05-26 NOTE — Care Management Note (Signed)
Case Management Note  Patient Details  Name: [name] MRN: [id number] Date of Birth: 1940/12/31  Subjective/Objective:   74 y/o f admitted w/tachycardia.From [facility].                 Action/Plan:d/c plan return to SNF.   Expected Discharge Date:                  Expected Discharge Plan:  [place] (From [facility])  In-House Referral:  Clinical Social Work  Discharge planning Services  CM Consult  Post Acute Care Choice:    Choice offered to:     DME Arranged:    DME Agency:     HH Arranged:    [place] Agency:     Status of Service:  In process, will continue to follow  Medicare Important Message Given:    Date Medicare IM Given:    Medicare IM give by:    Date Additional Medicare IM Given:    Additional Medicare Important Message give by:     If discussed at [place] of Stay Meetings, dates discussed:    Additional Comments:  Dessa Phi, RN 05/26/2015, 3:12 PM

## 2015-05-26 NOTE — Progress Notes (Signed)
Echocardiogram 2D Echocardiogram has been performed.  [name] 05/26/2015, 2:08 PM

## 2015-05-26 NOTE — Progress Notes (Addendum)
CRITICAL VALUE ALERT  Critical value received:  MRSA Positive  Date of notification:  6//6/16  Time of notification:  0115  Critical value read back:Yes.    Nurse who received alert:  Virgina Norfolk  MD notified (1st page):  Order set initiated by RN  Time of first page:  N/A  MD notified (2nd page):  Time of second page:  Responding MD:  n/a  Time MD responded:  n/a

## 2015-05-26 NOTE — Progress Notes (Addendum)
Progress Note   [name] OAC:[id number] DOB: Dec 19, 1941 DOA: 06/10/2015 PCP: Hennie Duos, MD   Brief Narrative:   [name] is an 74 y.o. 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  Assessment/Plan:   Principal Problem:  Tachycardia / rule out congestive heart failure - Suspect this is from CHF. Last echo done 04/03/12, EF 55-60 percent. Repeat echo pending. - Chest x-ray with findings concerning for congestive heart failure. - BNP WNL at 62.7. - Given Lasix on admission. Continue daily Demadex. - We'll place on 30 mg of Cardizem every 6 hours to help with rate control. Heart rate in the 110s. - Troponins negative 3. - TSH WNL at 2.355.  Active Problems:  Polypharmacy - Patient is on a large amount of medications. - She is very resistant for me to change any of her medications, particularly her psychoactive medications.   Hyperlipidemia LDL goal <70 / hypertriglyceridemia - Continue TriCor, fish oil supplement and Zocor.   BIPOLAR DISORDER / anxiety with obsessional features - Continue Xanax, Lamictal, Seroquel, Risperdal, trazodone, Effexor and IV Ativan as needed.   HYPERTENSION, BENIGN SYSTEMIC - Continue metoprolol and Demadex. Increase metoprolol to 50 mg twice a day.   Personal history of PE (pulmonary embolism) - Continue Eliquis. The patient has an IVC filter.   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   GERD - Continue PPI.   Thrush - Nystatin ordered.   Constipation - Continue bowel regimen with Dulcolax suppositories, MiraLAX, MOM/Fleet enema as needed.   Type 2 diabetes mellitus with  neurological manifestations, uncontrolled - Continue Lantus 20 units every morning, 22 units every afternoon. - Continue SSI, moderate scale before meals and at bedtime. CBG 204. - Follow-up hemoglobin A1c.   Dysuria/UTI - History of MRSA and ESBL infections in the past. - Urinalysis positive for nitrites and leukocytes. Patient complains of dysuria. - Continue Primaxin and follow-up on urine culture results.   Neuropathy due to secondary diabetes - Continue Neurontin.   Chronic respiratory failure - Continue supplemental oxygen.   DVT prophylaxis - On chronic anticoagulation.  Code Status: Full, discussed with patient and her daughter. Family Communication: Daughter, [name] at the bedside. [phone]) Disposition Plan: From [facility] Rehab.   IV Access:    Peripheral IV   Procedures and diagnostic studies:   Dg Chest Port 1 View  06/10/2015   CLINICAL DATA:  Tachycardia.  EXAM: PORTABLE CHEST - 1 VIEW  COMPARISON:  Two-view chest x-ray 09/12/2014  FINDINGS: The heart size is normal. The diffuse interstitial pattern is increased. Mild pulmonary vascular congestion is noted. Mild left basilar airspace opacity is noted. Degenerative changes are again seen in the shoulders, worse on the right.  IMPRESSION: 1. Increased diffuse interstitial pattern suggesting edema and mild pulmonary vascular congestion. 2. Asymmetric left basilar airspace disease. While this likely reflects atelectasis, early infection is also considered.   Electronically Signed   By: San Morelle M.D.   On: 06-10-2015 12:58     Medical Consultants:    None.  Anti-Infectives:    Primaxin 06-10-15--->  Subjective:   [name] slept all morning after being awake most of the night,  very anxious.  Calmer now.  Denies chest pain.  No dyspnea. Appetite OK.   Objective:    Filed Vitals:   05/25/15 2044 05/25/15 2055 05/26/15 0530 05/26/15 0533  BP: 144/65  134/76   Pulse:  111  110   Temp: 98.4 F (36.9 C)  98.5 F (36.9 C)   TempSrc: Oral  Oral   Resp: 26  22   Height: 5\' 2"  (1.575 m)     Weight:    92.2 kg (203 lb 4.2 oz)  SpO2: 98% 96% 98%     Intake/Output Summary (Last 24 hours) at 05/26/15 1222 Last data filed at 05/26/15 0545  Gross per 24 hour  Intake    560 ml  Output    702 ml  Net   -142 ml    Exam: Gen:  NAD Cardiovascular:  Tachy, No M/R/G Respiratory:  Lungs diminished Gastrointestinal:  Abdomen softly distended, + BS Extremities: 2+ edema   Data Reviewed:    Labs: Basic Metabolic Panel:  Recent Labs Lab 05/25/15 1128  NA 139  K 3.8  CL 98*  CO2 32  GLUCOSE 183*  BUN 12  CREATININE 0.48  CALCIUM 8.9   GFR Estimated Creatinine Clearance: 67.1 mL/min (by C-G formula based on Cr of 0.48). Liver Function Tests: No results for input(s): AST, ALT, ALKPHOS, BILITOT, PROT, ALBUMIN in the last 168 hours. No results for input(s): LIPASE, AMYLASE in the last 168 hours. No results for input(s): AMMONIA in the last 168 hours. Coagulation profile  Recent Labs Lab 05/25/15 1128  INR 1.06    CBC:  Recent Labs Lab 05/25/15 1128  WBC 14.7*  HGB 11.4*  HCT 38.2  MCV 91.8  PLT 182   Cardiac Enzymes:  Recent Labs Lab 05/25/15 1740 05/25/15 2307 05/26/15 0503  TROPONINI <0.03 <0.03 <0.03   BNP (last 3 results)  Recent Labs  09/12/14 1558  PROBNP 449.7*   CBG:  Recent Labs Lab 05/25/15 2004  GLUCAP 204*   Thyroid function studies:  Recent Labs  05/26/15 0503  TSH 2.355   Sepsis Labs:  Recent Labs Lab 05/25/15 1128 05/25/15 1341 05/25/15 1435  WBC 14.7*  --   --   LATICACIDVEN  --  1.61 1.79   Microbiology Recent Results (from the past 240 hour(s))  MRSA PCR Screening     Status: Abnormal   Collection Time: 05/25/15  8:20 PM  Result Value Ref Range Status   MRSA by PCR POSITIVE (A) NEGATIVE Final    Comment:        The GeneXpert MRSA Assay (FDA approved for NASAL  specimens only), is one component of a comprehensive MRSA colonization surveillance program. It is not intended to diagnose MRSA infection nor to guide or monitor treatment for MRSA infections. RESULT CALLED TO, READ BACK BY AND VERIFIED WITH: C ADDISON RN @ 0016 ON 05/26/15 BY C DAVIS      Medications:   . ALPRAZolam  1 mg Oral QID  . apixaban  5 mg Oral BID  . artificial tears  1 application Both Eyes QHS  . aspirin  81 mg Oral Q breakfast  . bisacodyl  10 mg Rectal Q M,W,F  . Chlorhexidine Gluconate Cloth  6 each Topical Q0600  . diltiazem  30 mg Oral 4 times per day  . fenofibrate  160 mg Oral Daily  . gabapentin  900 mg Oral TID  . imipenem-cilastatin  500 mg Intravenous 3 times per day  . insulin aspart  0-15 Units Subcutaneous TID WC  . insulin aspart  0-5 Units Subcutaneous QHS  . insulin glargine  20 Units Subcutaneous QAC breakfast  . insulin glargine  22 Units Subcutaneous QHS  . lamoTRIgine  150 mg Oral q morning - 10a  . loratadine  10 mg Oral q morning - 10a  . metoprolol tartrate  50 mg Oral BID WC  . mometasone-formoterol  2 puff Inhalation BID  . mupirocin ointment  1 application Nasal BID  . nystatin  5 mL Oral QID  . omega-3 acid ethyl esters  1 g Oral BID  . OxyCODONE  10 mg Oral Q12H  . pantoprazole  40 mg Oral Daily  . polyethylene glycol  17 g Oral Daily  . polyvinyl alcohol  1 drop Both Eyes QID  . potassium chloride  5 mEq Oral QPM  . QUEtiapine  100 mg Oral QHS  . QUEtiapine  50 mg Oral QHS  . risperiDONE  2 mg Oral BID  . simvastatin  10 mg Oral q1800  . sodium chloride  3 mL Intravenous Q12H  . tiotropium  18 mcg Inhalation Daily  . torsemide  40 mg Oral Q breakfast  . traZODone  150 mg Oral QHS  . venlafaxine XR  300 mg Oral QAC breakfast   Continuous Infusions:   Time spent: 35 minutes.  The patient is medically complex and requires high complexity decision making.    LOS: 1 day   RAMA,CHRISTINA  [facility] Pager  [phone]. If unable to reach me by pager, please call my cell phone at [phone].  *Please refer to [email or web address], password [email or web address] to get updated schedule on who will round on this patient, as hospitalists switch teams weekly. If 7PM-7AM, please contact night-coverage at [email or web address], password [email or web address] for any overnight needs.  05/26/2015, 12:22 PM

## 2015-05-26 NOTE — Plan of Care (Signed)
Problem: Phase I Progression Outcomes Goal: Up in chair, BRP Outcome: Not Applicable Date Met:  79/98/72 Patient wheelchair bound

## 2015-05-27 ENCOUNTER — Encounter (HOSPITAL_COMMUNITY): Payer: Self-pay | Admitting: Internal Medicine

## 2015-05-27 DIAGNOSIS — Z22322 Carrier or suspected carrier of Methicillin resistant Staphylococcus aureus: Secondary | ICD-10-CM

## 2015-05-27 HISTORY — DX: Carrier or suspected carrier of methicillin resistant Staphylococcus aureus: Z22.322

## 2015-05-27 LAB — CBC
HEMATOCRIT: 34.9 % — AB (ref 36.0–46.0)
Hemoglobin: 10.3 g/dL — ABNORMAL LOW (ref 12.0–15.0)
MCH: 27.7 pg (ref 26.0–34.0)
MCHC: 29.5 g/dL — ABNORMAL LOW (ref 30.0–36.0)
MCV: 93.8 fL (ref 78.0–100.0)
Platelets: 182 10*3/uL (ref 150–400)
RBC: 3.72 MIL/uL — AB (ref 3.87–5.11)
RDW: 15.3 % (ref 11.5–15.5)
WBC: 6.3 10*3/uL (ref 4.0–10.5)

## 2015-05-27 LAB — GLUCOSE, CAPILLARY
GLUCOSE-CAPILLARY: 169 mg/dL — AB (ref 65–99)
GLUCOSE-CAPILLARY: 126 mg/dL — AB (ref 65–99)
GLUCOSE-CAPILLARY: 161 mg/dL — AB (ref 65–99)
Glucose-Capillary: 206 mg/dL — ABNORMAL HIGH (ref 65–99)

## 2015-05-27 LAB — URINE CULTURE: Colony Count: >=100000

## 2015-05-27 LAB — HEMOGLOBIN A1C
Hgb A1c MFr Bld: 8.3 % — ABNORMAL HIGH (ref 4.8–5.6)
MEAN PLASMA GLUCOSE: 192 mg/dL

## 2015-05-27 MED ORDER — INSULIN GLARGINE 100 UNIT/ML ~~LOC~~ SOLN
24.0000 [IU] | Freq: Every day | SUBCUTANEOUS | Status: DC
  Administered 2015-05-27: 24 [IU] via SUBCUTANEOUS
  Filled 2015-05-27 (×2): qty 0.24

## 2015-05-27 MED ORDER — INSULIN GLARGINE 100 UNIT/ML ~~LOC~~ SOLN
22.0000 [IU] | Freq: Every day | SUBCUTANEOUS | Status: DC
  Administered 2015-05-27 – 2015-05-28 (×2): 22 [IU] via SUBCUTANEOUS
  Filled 2015-05-27 (×2): qty 0.22

## 2015-05-27 MED ORDER — DILTIAZEM HCL ER COATED BEADS 120 MG PO CP24
120.0000 mg | ORAL_CAPSULE | Freq: Every day | ORAL | Status: DC
  Administered 2015-05-28: 120 mg via ORAL
  Filled 2015-05-27: qty 1

## 2015-05-27 NOTE — Clinical Social Work Note (Signed)
Clinical Social Work Assessment  Patient Details  Name: [name] MRN: [id number] Date of Birth: May 22, 1941  Date of referral:  05/27/15               Reason for consult:  Facility Placement                Permission sought to share information with:  Facility Art therapist granted to share information::  Yes, Verbal Permission Granted  Name::        Agency::     Relationship::     Contact Information:     Housing/Transportation Living arrangements for the past 2 months:  [place] of Information:  Patient Patient Interpreter Needed:  None Criminal Activity/Legal Involvement Pertinent to Current Situation/Hospitalization:  No - Comment as needed Significant Relationships:  Adult Children Lives with:  Facility Resident Do you feel safe going back to the place where you live?  Yes Need for family participation in patient care:  Yes (Comment)  Care giving concerns:  CSW received consult that patient was admitted from SNF.    Social Worker assessment / plan:  CSW confirmed with patient's daughter, [name] that patient is from [place] is for patient to return to [facility] at discharge.   Employment status:  Retired Forensic scientist:  Medicare PT Recommendations:  Not assessed at this time Information / Referral to community resources:     Patient/Family's Response to care:  Patient's daughter expressed that she's been pleased with the care she's been receiving at [facility]. CSW confirmed with Lexine Baton at [facility] that they can accept patient back at discharge.   Patient/Family's Understanding of and Emotional Response to Diagnosis, Current Treatment, and Prognosis:  Daughter expressed concern over heart rate, will relay to MD & have daughter given update.   Emotional Assessment Appearance:  Appears stated age Attitude/Demeanor/Rapport:    Affect (typically observed):    Orientation:  Oriented to Self, Oriented to  Place, Oriented to  Time, Oriented to Situation Alcohol / Substance use:    Psych involvement (Current and /or in the community):     Discharge Needs  Concerns to be addressed:    Readmission within the last 30 days:    Current discharge risk:    Barriers to Discharge:      Standley Brooking, LCSW 05/27/2015, 9:56 AM

## 2015-05-27 NOTE — Progress Notes (Addendum)
Progress Note   [name] ZOX:[id number] DOB: Jul 20, 1941 DOA: 06/21/15 PCP: Margit Hanks, MD   Brief Narrative:   [name] is an 74 y.o. 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 A 2-D echo has been obtained and the patient has been placed on oral Cardizem. Her home dose of metoprolol has been increased. This thought that her tachycardia is multifactorial with Escherichia coli UTI contributory. Awaiting culture results. Can likely be discharged back to her skilled facility 05/28/15 if culture results finalized. Heart rate has improved on Cardizem and increased dose metoprolol.  Assessment/Plan:   Principal Problem:  Tachycardia / rule out congestive heart failure - Suspect this is from CHF. Last echo done 04/03/12, EF 55-60 percent. Repeat echo pending. - Chest x-ray with findings concerning for congestive heart failure. - BNP WNL at 62.7. - Given Lasix on admission. Continue daily Demadex. - Troponins negative 3. - TSH WNL at 2.355. - Heart rate 91-122 on 30 mg of Cardizem every 6 hours and increase in metoprolol to 50 mg BID. - We'll switch to long-acting Cardizem in the morning.  Active Problems:   MRSA carrier - Continue decontamination therapy and contact isolation.   Polypharmacy - Patient is on a large amount of medications. - She is very resistant for me to change any of her medications, particularly her psychoactive medications.   Hyperlipidemia LDL goal <70 / hypertriglyceridemia - Continue TriCor, fish oil supplement and Zocor.   BIPOLAR DISORDER / anxiety with obsessional features - Continue Xanax, Lamictal, Seroquel, Risperdal, trazodone, Effexor and IV Ativan as needed.   HYPERTENSION, BENIGN SYSTEMIC -  Continue metoprolol and Demadex. Increase metoprolol to 50 mg twice a day.   Personal history of PE (pulmonary embolism) - Continue Eliquis. The patient has an IVC filter.   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   GERD - Continue PPI.   Thrush - Nystatin ordered.   Constipation - Continue bowel regimen with Dulcolax suppositories, MiraLAX, MOM/Fleet enema as needed.   Type 2 diabetes mellitus with neurological manifestations, uncontrolled - Continue Lantus 20 units every morning, 22 units every afternoon. - Continue SSI, moderate scale before meals and at bedtime. CBG 157-231. - Hemoglobin A1c 8.3% indicating suboptimal outpatient control. - Increase Lantus to 22 units in the morning and 24 units in the afternoon.   Dysuria/UTI - History of MRSA and ESBL infections in the past. - Urinalysis positive for nitrites and leukocytes. Patient complains of dysuria. - Continue Primaxin, preliminary cultures growing Escherichia coli. Follow-up sensitivities.   Neuropathy due to secondary diabetes - Continue Neurontin.   Chronic respiratory failure - Continue supplemental oxygen.   DVT prophylaxis - On chronic anticoagulation.  Code Status: Full, discussed with patient and her daughter. Family Communication: Daughter, [name] updated by telephone [phone]).  Would like to be updated daily. Disposition Plan: From [facility].  Can return there with oral antibiotics once culture results back.   IV Access:    Peripheral IV   Procedures and diagnostic studies:   Dg Chest Port 1 View  Jun 21, 2015   CLINICAL DATA:  Tachycardia.  EXAM: PORTABLE CHEST - 1 VIEW  COMPARISON:  Two-view chest x-ray 09/12/2014  FINDINGS: The heart size is normal. The diffuse interstitial pattern is increased. Mild pulmonary vascular congestion is noted.  Mild left basilar airspace opacity is noted. Degenerative changes are again seen in the shoulders, worse on the right.  IMPRESSION: 1. Increased diffuse interstitial pattern suggesting edema and mild pulmonary vascular congestion. 2. Asymmetric left basilar airspace disease. While this likely reflects atelectasis, early infection is also considered.   Electronically Signed   By: Marin Roberts M.D.   On: 05/25/2015 12:58   2 D Echocardiogram 05/26/15  Study Conclusions  - Procedure narrative: Transthoracic echocardiography. Image quality was poor. The study was technically difficult, as a result of poor acoustic windows and body habitus. Intravenous contrast (Definity) was administered. - Left ventricle: The cavity size was normal. Wall thickness was increased in a pattern of mild LVH. Systolic function was vigorous. The estimated ejection fraction was in the range of 65% to 70%. The study is not technically sufficient to allow evaluation of LV diastolic function. - Left atrium: The atrium was normal in size.  Medical Consultants:    None.  Anti-Infectives:    Primaxin 05/25/15--->  Subjective:   [name] is a little less anxious today, still reports some generalized feeling of unwellness and mild nausea, occasional chills.   Objective:    Filed Vitals:   05/26/15 0533 05/26/15 1340 05/26/15 2206 05/27/15 0619  BP:  141/71 117/55 102/52  Pulse:  122 91 96  Temp:  98.1 F (36.7 C) 97.7 F (36.5 C) 98.7 F (37.1 C)  TempSrc:  Oral Axillary Oral  Resp:  20 20 19   Height:      Weight: 92.2 kg (203 lb 4.2 oz)     SpO2:  92% 95% 99%    Intake/Output Summary (Last 24 hours) at 05/27/15 0747 Last data filed at 05/27/15 0620  Gross per 24 hour  Intake   1320 ml  Output   2400 ml  Net  -1080 ml    Exam: Gen:  NAD Cardiovascular:  Tachy, No M/R/G Respiratory:  Lungs diminished Gastrointestinal:  Abdomen softly distended, + BS Extremities: 2+  edema   Data Reviewed:    Labs: Basic Metabolic Panel:  Recent Labs Lab 05/25/15 1128  NA 139  K 3.8  CL 98*  CO2 32  GLUCOSE 183*  BUN 12  CREATININE 0.48  CALCIUM 8.9   GFR Estimated Creatinine Clearance: 67.1 mL/min (by C-G formula based on Cr of 0.48). Liver Function Tests: No results for input(s): AST, ALT, ALKPHOS, BILITOT, PROT, ALBUMIN in the last 168 hours. No results for input(s): LIPASE, AMYLASE in the last 168 hours. No results for input(s): AMMONIA in the last 168 hours. Coagulation profile  Recent Labs Lab 05/25/15 1128  INR 1.06    CBC:  Recent Labs Lab 05/25/15 1128 05/27/15 0500  WBC 14.7* 6.3  HGB 11.4* 10.3*  HCT 38.2 34.9*  MCV 91.8 93.8  PLT 182 182   Cardiac Enzymes:  Recent Labs Lab 05/25/15 1740 05/25/15 2307 05/26/15 0503  TROPONINI <0.03 <0.03 <0.03   BNP (last 3 results)  Recent Labs  09/12/14 1558  PROBNP 449.7*   CBG:  Recent Labs Lab 05/25/15 2004 05/26/15 1141 05/26/15 1706 05/26/15 2202  GLUCAP 204* 182* 231* 157*   Thyroid function studies:  Recent Labs  05/26/15 0503  TSH 2.355   Sepsis Labs:  Recent Labs Lab 05/25/15 1128 05/25/15 1341 05/25/15 1435 05/27/15 0500  WBC 14.7*  --   --  6.3  LATICACIDVEN  --  1.61 1.79  --    Microbiology Recent Results (from the past 240 hour(s))  Urine culture     Status: None (Preliminary result)   Collection Time: 05/25/15  3:00 PM  Result Value Ref Range Status   Specimen Description URINE, CATHETERIZED  Final   Special Requests NONE  Final   Colony Count   Final    >=100,000 COLONIES/ML Performed at [facility]    Culture   Final    ESCHERICHIA COLI Performed at [facility]    Report Status PENDING  Incomplete  MRSA PCR Screening     Status: Abnormal   Collection Time: 05/25/15  8:20 PM  Result Value Ref Range Status   MRSA by PCR POSITIVE (A) NEGATIVE Final    Comment:        The GeneXpert MRSA Assay (FDA approved  for NASAL specimens only), is one component of a comprehensive MRSA colonization surveillance program. It is not intended to diagnose MRSA infection nor to guide or monitor treatment for MRSA infections. RESULT CALLED TO, READ BACK BY AND VERIFIED WITH: C ADDISON RN @ 0016 ON 05/26/15 BY C DAVIS      Medications:   . ALPRAZolam  1 mg Oral QID  . apixaban  5 mg Oral BID  . artificial tears  1 application Both Eyes QHS  . aspirin  81 mg Oral Q breakfast  . bisacodyl  10 mg Rectal Q M,W,F  . Chlorhexidine Gluconate Cloth  6 each Topical Q0600  . diltiazem  30 mg Oral 4 times per day  . fenofibrate  160 mg Oral Daily  . gabapentin  900 mg Oral TID  . imipenem-cilastatin  500 mg Intravenous 3 times per day  . insulin aspart  0-15 Units Subcutaneous TID WC  . insulin aspart  0-5 Units Subcutaneous QHS  . insulin glargine  20 Units Subcutaneous QAC breakfast  . insulin glargine  22 Units Subcutaneous QHS  . lamoTRIgine  150 mg Oral q morning - 10a  . loratadine  10 mg Oral q morning - 10a  . metoprolol tartrate  50 mg Oral BID WC  . mometasone-formoterol  2 puff Inhalation BID  . mupirocin ointment  1 application Nasal BID  . nystatin  5 mL Oral QID  . omega-3 acid ethyl esters  1 g Oral BID  . OxyCODONE  10 mg Oral Q12H  . pantoprazole  40 mg Oral Daily  . polyethylene glycol  17 g Oral Daily  . polyvinyl alcohol  1 drop Both Eyes QID  . potassium chloride  5 mEq Oral QPM  . QUEtiapine  100 mg Oral QHS  . QUEtiapine  50 mg Oral QHS  . risperiDONE  2 mg Oral BID  . simvastatin  10 mg Oral q1800  . sodium chloride  3 mL Intravenous Q12H  . tiotropium  18 mcg Inhalation Daily  . torsemide  40 mg Oral Q breakfast  . traZODone  150 mg Oral QHS  . venlafaxine XR  300 mg Oral QAC breakfast   Continuous Infusions:   Time spent: 35 minutes.  The patient is medically complex and requires high complexity decision making.    LOS: 2 days   Thamara Leger  [facility] Pager [phone]. If unable to reach me by pager, please call my cell phone at [phone].  *Please refer to [email or web address], password [email or web address] to get updated schedule on who will round on this patient, as hospitalists  switch teams weekly. If 7PM-7AM, please contact night-coverage at [email or web address], password [email or web address] for any overnight needs.  05/27/2015, 7:47 AM

## 2015-05-28 DIAGNOSIS — R000 Tachycardia, unspecified: Secondary | ICD-10-CM

## 2015-05-28 LAB — GLUCOSE, CAPILLARY
GLUCOSE-CAPILLARY: 143 mg/dL — AB (ref 65–99)
GLUCOSE-CAPILLARY: 184 mg/dL — AB (ref 65–99)

## 2015-05-28 MED ORDER — HEPARIN SOD (PORK) LOCK FLUSH 100 UNIT/ML IV SOLN
250.0000 [IU] | INTRAVENOUS | Status: AC | PRN
  Administered 2015-05-28: 17:00:00

## 2015-05-28 MED ORDER — IMIPENEM-CILASTATIN 500 MG IV SOLR: 500.0000 mg | Freq: Three times a day (TID) | INTRAVENOUS | Status: DC

## 2015-05-28 MED ORDER — METOPROLOL TARTRATE 50 MG PO TABS: 50.0000 mg | ORAL_TABLET | Freq: Two times a day (BID) | ORAL | Status: AC

## 2015-05-28 MED ORDER — OXYCODONE HCL ER 10 MG PO T12A: EXTENDED_RELEASE_TABLET | ORAL | Status: AC

## 2015-05-28 MED ORDER — ALPRAZOLAM 1 MG PO TABS: 1.0000 mg | ORAL_TABLET | Freq: Four times a day (QID) | ORAL | Status: AC

## 2015-05-28 MED ORDER — DILTIAZEM HCL ER COATED BEADS 120 MG PO CP24: 120.0000 mg | ORAL_CAPSULE | Freq: Every day | ORAL | Status: AC

## 2015-05-28 MED ORDER — SODIUM CHLORIDE 0.9 % IJ SOLN
10.0000 mL | INTRAMUSCULAR | Status: DC | PRN
  Administered 2015-05-28: 10 mL
  Filled 2015-05-28: qty 40

## 2015-05-28 NOTE — Progress Notes (Signed)
Responding to spiritual care consult.    Pt requesting prayer.  Chaplain provided emotional and grief support and prayer at bedside.  [name] initially distressed around discharge, stating she is fearful of discharging back to [facility], as she feels alone there.  Spoke with chaplain about her transition to SNF and grief around "not having a home."  [name] also expressed grief around death of father and death of mother.  She found her father after his death and this event continues to feel traumatic for her.  She also feels some guilt around not telling him that he had a potentially fatal blood clot.  It seems she was functioning as his HCPOA and chose not to tell him this at the recommendation of his doctors.  Spoke with chaplain about "trying to care for him in the best way she could."  [name] was quite close wit her mother and recalled some of this relationship with chaplain.  [name] was relieved to hear that a family member would meet her at [facility] after discharge from [facility].   [place], [place]

## 2015-05-28 NOTE — Progress Notes (Signed)
[facility] called for transport to pickup at 5:00pm.

## 2015-05-28 NOTE — Progress Notes (Signed)
Peripherally Inserted Central Catheter/Midline Placement  The IV Nurse has discussed with the patient and/or persons authorized to consent for the patient, the purpose of this procedure and the potential benefits and risks involved with this procedure.  The benefits include less needle sticks, lab draws from the catheter and patient may be discharged home with the catheter.  Risks include, but not limited to, infection, bleeding, blood clot (thrombus formation), and puncture of an artery; nerve damage and irregular heat beat.  Alternatives to this procedure were also discussed.  PICC/Midline Placement Documentation        [name] 05/28/2015, 3:26 PM

## 2015-05-28 NOTE — Care Management Note (Signed)
Case Management Note  Patient Details  Name: [name] MRN: [id number] Date of Birth: February 16, 1941  Subjective/Objective:                    Action/Plan:d/c back to snf.   Expected Discharge Date:                  Expected Discharge Plan:  [place] ([facility])  In-House Referral:  Clinical Social Work  Discharge planning Services  CM Consult  Post Acute Care Choice:    Choice offered to:     DME Arranged:    DME Agency:     HH Arranged:    [place]:     Status of Service:  Completed, signed off  Medicare Important Message Given:  Yes Date Medicare IM Given:  05/28/15 Medicare IM give by:  Dessa Phi Date Additional Medicare IM Given:    Additional Medicare Important Message give by:     If discussed at [place] of Stay Meetings, dates discussed:    Additional Comments:  Dessa Phi, RN 05/28/2015, 12:36 PM

## 2015-05-28 NOTE — Progress Notes (Signed)
Patient is set to discharge back to [facility] today. Patient & daughter, [name] aware. Discharge packet given to RN, Susie. [facility] will be called for transport once PICC line is placed.     Raynaldo Opitz, [facility] Clinical Social Worker cell #: [phone]

## 2015-05-28 NOTE — Discharge Summary (Signed)
Physician Discharge Summary  [name] ENM:[id number] DOB: 03-09-1941 DOA: 05/25/2015  PCP: Hennie Duos, MD  Admit date: 05/25/2015 Discharge date: 05/28/2015  Time spent: > 35 minutes  Recommendations for Outpatient Follow-up:  1. Please monitor blood sugars and adjust hypoglycemic agents accordingly 2. Patient's mental status improving on a probiotic regimen. Of note she is on multiple medications which can cause alteration in mental status well. Should patient become somnolent please consider decreasing benzodiazepine's and gabapentin. 3. Patient will require 4 more days of Primaxin on discharge. PICC line to be inserted prior to discharge. After completion of anti-Biotics PICC line may be removed  Discharge Diagnoses:  Principal Problem:   Tachycardia Active Problems:   Hyperlipidemia LDL goal <70   BIPOLAR DISORDER   Anxiety with obsessional features   HYPERTENSION, BENIGN SYSTEMIC   Personal history of PE (pulmonary embolism)   Chronic pulmonary heart disease   COPD (chronic obstructive pulmonary disease) with emphysema   GERD   Thrush   Constipation   Type 2 diabetes mellitus with neurological manifestations, uncontrolled   Dysuria   UTI   Neuropathy due to secondary diabetes   Chronic respiratory failure   MRSA carrier   Discharge Condition: stable  Diet recommendation: heart healthy/carb modified  Filed Weights   05/26/15 0533  Weight: 92.2 kg (203 lb 4.2 oz)    History of present illness:  From original HPI: 74 y.o. 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 The patient denies chest pain, but does report some shortness of breath/wheezing today. The patient reports that she has had some nausea and vomiting over the past week. She is extremely anxious during my interview with her, repeatedly asking for her medications including Xanax. Upon initial evaluation  in the ED, the patient's heart rate is sustaining the 140s, 12-lead EKG shows sinus tachycardia.  Hospital Course:  Tachycardia - improved with increased dose of cardizem - could have been due to active infection   Polypharmacy - Both patient and daughter are resistant to any changes in patient's home medication regimen  Hyperlipidemia LDL goal less than 70 - Continue TriCor, fish oral supplement Zocor  Bipolar disorder - Continue home medication regimen  Hypertension - Continue metoprolol, Demadex, Cardizem  History of PE -Continue Eliquis. Of note patient has an IVC filter  DM - continue home regimen on d/c. Will defer to pcp whether or not to increase dose of hypoglycemic agents  UTI - Growing more than 100,000 colony-forming units of Escherichia coli which is drug-resistant - Sensitive to Invanz and as such will continue on discharge. Will treat for 7 days total for complicated UTI - PICC line to be inserted on discharge. Patient to continue Antibiotics for 4 more days after d/c  Procedures:  None  Consultations:  none  Discharge Exam: Filed Vitals:   05/28/15 0508  BP: 118/65  Pulse: 100  Temp: 98.5 F (36.9 C)  Resp: 16    General: Pt in NAD, alert and awake Cardiovascular: rrr, no rubs Respiratory: cta bl, no wheezes  Discharge Instructions   Discharge Instructions    Call MD for:  redness, tenderness, or signs of infection (pain, swelling, redness, odor or green/yellow discharge around incision site)    Complete by:  As directed      Call MD for:  temperature >100.4    Complete by:  As directed  Diet - low sodium heart healthy    Complete by:  As directed      Discharge instructions    Complete by:  As directed   Patient will require physical therapy and 4 more days of antibiotics to complete a total course of 7 days of antibiotic treatment.     Increase activity slowly    Complete by:  As directed           Current Discharge  Medication List    START taking these medications   Details  diltiazem (CARDIZEM CD) 120 MG 24 hr capsule Take 1 capsule (120 mg total) by mouth daily. Qty: 30 capsule, Refills: 0    imipenem-cilastatin 500 mg in sodium chloride 0.9 % 100 mL Inject 500 mg into the vein every 8 (eight) hours.      CONTINUE these medications which have CHANGED   Details  ALPRAZolam (XANAX) 1 MG tablet Take 1 tablet (1 mg total) by mouth 4 (four) times daily. Take at 0600, 1000, 1400, and 1800. Qty: 30 tablet, Refills: 0    metoprolol tartrate (LOPRESSOR) 50 MG tablet Take 1 tablet (50 mg total) by mouth 2 (two) times daily. At 0800 and 1700. Qty: 60 tablet, Refills: 0    OxyCODONE (OXYCONTIN) 10 mg T12A 12 hr tablet Take one tablet by mouth every 12 hours PRN pain. Do not crush Qty: 60 tablet, Refills: 0      CONTINUE these medications which have NOT CHANGED   Details  acetaminophen (TYLENOL) 325 MG tablet Take 650 mg by mouth every 4 (four) hours as needed for mild pain.    alum & mag hydroxide-simeth (MAALOX/MYLANTA) 200-200-20 MG/5ML suspension Take 30 mLs by mouth every 4 (four) hours as needed for indigestion.    antiseptic oral rinse (BIOTENE) LIQD 15 mLs by Mouth Rinse route every 4 (four) hours as needed for dry mouth.    apixaban (ELIQUIS) 5 MG TABS tablet Take 2 tablets (10 mg total) by mouth 2 (two) times daily. Qty: 30 tablet, Refills: 0    Artificial Tear Ointment (REFRESH LACRI-LUBE) OINT Place 1 application into both eyes at bedtime.    aspirin 81 MG chewable tablet Chew 81 mg by mouth daily with breakfast.     benzocaine-menthol (CHLORASEPTIC) 6-10 MG lozenge Take 1 lozenge by mouth every 2 (two) hours as needed for sore throat.    bisacodyl (DULCOLAX) 10 MG suppository Place 10 mg rectally as directed. On Monday, Wednesday, and Friday and daily as needed if no bowel movement in 2 days.    Cranberry-Vitamin C-Inulin (UTI-STAT) LIQD Take 30 mLs by mouth every morning.      esomeprazole (NEXIUM) 40 MG capsule Take 40 mg by mouth daily before breakfast.     fenofibrate (TRICOR) 145 MG tablet Take 145 mg by mouth daily.    fish oil-omega-3 fatty acids 1000 MG capsule Take 1 g by mouth 2 (two) times daily.     Fluticasone-Salmeterol (ADVAIR DISKUS) 250-50 MCG/DOSE AEPB Inhale 1 puff into the lungs 2 (two) times daily. Qty: 60 each, Refills: 11    gabapentin (NEURONTIN) 300 MG capsule Take 3 capsules (900 mg total) by mouth 3 (three) times daily.    insulin aspart (NOVOLOG) 100 UNIT/ML FlexPen Inject 3-5 Units into the skin 3 (three) times daily before meals. Give 3 units before meals with an additional 5 units for CBG> or < 150    insulin glargine (LANTUS) 100 UNIT/ML injection Inject 20-22 Units into the skin 2 (two)  times daily. Takes 20 units in the morning and 22 units at bedtime    lamoTRIgine (LAMICTAL) 150 MG tablet Take 150 mg by mouth every morning.     levalbuterol (XOPENEX HFA) 45 MCG/ACT inhaler Inhale 2 puffs into the lungs every 4 (four) hours as needed for wheezing.     levalbuterol (XOPENEX) 1.25 MG/0.5ML nebulizer solution Take 1.25 mg by nebulization 4 (four) times daily as needed for wheezing or shortness of breath.    loperamide (IMODIUM) 2 MG capsule Take 2 mg by mouth as needed for diarrhea or loose stools.    loratadine (CLARITIN) 10 MG tablet Take 10 mg by mouth every morning.     magnesium hydroxide (MILK OF MAGNESIA) 400 MG/5ML suspension Take 30 mLs by mouth daily as needed for mild constipation.    OXYGEN Inhale 2 L into the lungs continuous. Via nasal cannula.    Polyethyl Glycol-Propyl Glycol (SYSTANE) 0.4-0.3 % SOLN Place 1 drop into both eyes 4 (four) times daily.    polyethylene glycol (MIRALAX / GLYCOLAX) packet Take 17 g by mouth daily.     polyvinyl alcohol (LIQUIFILM TEARS) 1.4 % ophthalmic solution Place 2 drops into both eyes 4 (four) times daily as needed for dry eyes.    potassium chloride (K-DUR,KLOR-CON) 10  MEQ tablet Take 5 mEq by mouth every evening.     !! QUEtiapine (SEROQUEL) 100 MG tablet Take 100 mg by mouth at bedtime. Give 1 tablet along with Seroquel 50mg  to = 150mg  at bedtime    !! QUEtiapine (SEROQUEL) 50 MG tablet Take 50 mg by mouth at bedtime. Take along with Seroquel 100mg  to = 150mg  at bedtime.    risperiDONE (RISPERDAL) 2 MG tablet Take 2 mg by mouth 2 (two) times daily.    simvastatin (ZOCOR) 10 MG tablet Take 10 mg by mouth daily at 6 PM.     sodium phosphate (FLEET) enema Place 1 enema rectally daily as needed (constipation). follow package directions    tiotropium (SPIRIVA) 18 MCG inhalation capsule Place 1 capsule (18 mcg total) into inhaler and inhale daily. Qty: 30 capsule, Refills: 11    torsemide (DEMADEX) 20 MG tablet Take 40 mg by mouth daily with breakfast.     traZODone (DESYREL) 150 MG tablet Take 150 mg by mouth at bedtime.    venlafaxine XR (EFFEXOR-XR) 150 MG 24 hr capsule Take 300 mg by mouth every morning.      !! - Potential duplicate medications found. Please discuss with provider.    STOP taking these medications     LORazepam (ATIVAN) 2 MG/ML injection      trimethoprim (TRIMPEX) 100 MG tablet        Allergies  Allergen Reactions  . Benzodiazepines Other (See Comments)    Mood dysregulation  (on MAR)  . Morphine And Related Other (See Comments)    Pain dysregulation Mood disruption  . Olanzapine     Hallucinations and disorientation  . Sulfamethoxazole-Trimethoprim Hives      The results of significant diagnostics from this hospitalization (including imaging, microbiology, ancillary and laboratory) are listed below for reference.    Significant Diagnostic Studies: Dg Chest Port 1 View  05/25/2015   CLINICAL DATA:  Tachycardia.  EXAM: PORTABLE CHEST - 1 VIEW  COMPARISON:  Two-view chest x-ray 09/12/2014  FINDINGS: The heart size is normal. The diffuse interstitial pattern is increased. Mild pulmonary vascular congestion is noted.  Mild left basilar airspace opacity is noted. Degenerative changes are again seen in the shoulders,  worse on the right.  IMPRESSION: 1. Increased diffuse interstitial pattern suggesting edema and mild pulmonary vascular congestion. 2. Asymmetric left basilar airspace disease. While this likely reflects atelectasis, early infection is also considered.   Electronically Signed   By: San Morelle M.D.   On: 05/25/2015 12:58    Microbiology: Recent Results (from the past 240 hour(s))  Urine culture     Status: None   Collection Time: 05/25/15  3:00 PM  Result Value Ref Range Status   Specimen Description URINE, CATHETERIZED  Final   Special Requests NONE  Final   Colony Count   Final    >=100,000 COLONIES/ML Performed at [facility]    Culture   Final    ESCHERICHIA COLI Note: Confirmed Extended Spectrum Beta-Lactamase Producer (ESBL) Performed at [facility]    Report Status 05/27/2015 FINAL  Final   Organism ID, Bacteria ESCHERICHIA COLI  Final      Susceptibility   Escherichia coli - MIC*    AMPICILLIN >=32 RESISTANT Resistant     CEFAZOLIN >=64 RESISTANT Resistant     CEFTRIAXONE >=64 RESISTANT Resistant     CIPROFLOXACIN >=4 RESISTANT Resistant     GENTAMICIN >=16 RESISTANT Resistant     LEVOFLOXACIN >=8 RESISTANT Resistant     NITROFURANTOIN <=16 SENSITIVE Sensitive     TOBRAMYCIN >=16 RESISTANT Resistant     TRIMETH/SULFA >=320 RESISTANT Resistant     IMIPENEM <=0.25 SENSITIVE Sensitive     PIP/TAZO 8 SENSITIVE Sensitive     * ESCHERICHIA COLI  MRSA PCR Screening     Status: Abnormal   Collection Time: 05/25/15  8:20 PM  Result Value Ref Range Status   MRSA by PCR POSITIVE (A) NEGATIVE Final    Comment:        The GeneXpert MRSA Assay (FDA approved for NASAL specimens only), is one component of a comprehensive MRSA colonization surveillance program. It is not intended to diagnose MRSA infection nor to guide or monitor treatment for MRSA  infections. RESULT CALLED TO, READ BACK BY AND VERIFIED WITH: C ADDISON RN @ 0016 ON 05/26/15 BY C DAVIS      Labs: Basic Metabolic Panel:  Recent Labs Lab 05/25/15 1128  NA 139  K 3.8  CL 98*  CO2 32  GLUCOSE 183*  BUN 12  CREATININE 0.48  CALCIUM 8.9   Liver Function Tests: No results for input(s): AST, ALT, ALKPHOS, BILITOT, PROT, ALBUMIN in the last 168 hours. No results for input(s): LIPASE, AMYLASE in the last 168 hours. No results for input(s): AMMONIA in the last 168 hours. CBC:  Recent Labs Lab 05/25/15 1128 05/27/15 0500  WBC 14.7* 6.3  HGB 11.4* 10.3*  HCT 38.2 34.9*  MCV 91.8 93.8  PLT 182 182   Cardiac Enzymes:  Recent Labs Lab 05/25/15 1740 05/25/15 2307 05/26/15 0503  TROPONINI <0.03 <0.03 <0.03   BNP: BNP (last 3 results)  Recent Labs  03/16/15 1943 05/25/15 1421  BNP 23.6 62.7    ProBNP (last 3 results)  Recent Labs  09/12/14 1558  PROBNP 449.7*    CBG:  Recent Labs Lab 05/27/15 1150 05/27/15 1641 05/27/15 2146 05/28/15 0723 05/28/15 1141  GLUCAP 169* 126* 161* 143* 184*       Signed:  Velvet Bathe  [facility] 05/28/2015, 12:52 PM

## 2015-05-30 ENCOUNTER — Encounter: Payer: Self-pay | Admitting: Internal Medicine

## 2015-05-30 ENCOUNTER — Non-Acute Institutional Stay (SKILLED_NURSING_FACILITY): Payer: Medicare Other | Admitting: Internal Medicine

## 2015-05-30 DIAGNOSIS — I82409 Acute embolism and thrombosis of unspecified deep veins of unspecified lower extremity: Secondary | ICD-10-CM | POA: Insufficient documentation

## 2015-05-30 DIAGNOSIS — I471 Supraventricular tachycardia: Secondary | ICD-10-CM

## 2015-05-30 DIAGNOSIS — E785 Hyperlipidemia, unspecified: Secondary | ICD-10-CM

## 2015-05-30 DIAGNOSIS — R000 Tachycardia, unspecified: Secondary | ICD-10-CM | POA: Insufficient documentation

## 2015-05-30 DIAGNOSIS — N390 Urinary tract infection, site not specified: Secondary | ICD-10-CM

## 2015-05-30 DIAGNOSIS — F319 Bipolar disorder, unspecified: Secondary | ICD-10-CM | POA: Diagnosis not present

## 2015-05-30 DIAGNOSIS — I10 Essential (primary) hypertension: Secondary | ICD-10-CM

## 2015-05-30 DIAGNOSIS — I82402 Acute embolism and thrombosis of unspecified deep veins of left lower extremity: Secondary | ICD-10-CM | POA: Diagnosis not present

## 2015-05-30 NOTE — Assessment & Plan Note (Signed)
Current med regimen has been finely crafted by Psych and is working better than any prior;plan to continue

## 2015-05-30 NOTE — Assessment & Plan Note (Signed)
Continue eliquis  ?

## 2015-05-30 NOTE — Assessment & Plan Note (Signed)
Continue TriCor, fish oral supplement Zocor

## 2015-05-30 NOTE — Assessment & Plan Note (Signed)
improved with increased dose of cardizem - could have been due to active infection

## 2015-05-30 NOTE — Progress Notes (Signed)
MRN: [id number] Name: [name]  Sex: female Age: 74 y.o. DOB: 05-Jun-1941  [place] #: [facility] Facility/Room:217 Level Of Care: SNF Provider: Inocencio Homes D Emergency Contacts: Extended Emergency Contact Information Primary Emergency Contact: Wright,[name] Address: [place], [place] [address] [place] Work Phone: [phone] Mobile Phone: [phone] Relation: Daughter Secondary Emergency Contact: Rachelle Hora [place] Phone: [phone] Relation: Sister  Code Status:FULL   Allergies: Benzodiazepines; Morphine and related; Olanzapine; and Sulfamethoxazole-trimethoprim  Chief Complaint  Patient presents with  . Readmit To SNF    HPI: Patient is 74 y.o. female who is admitted to SNF after being hospitalized for a resistant UTI.  Past Medical History  Diagnosis Date  . Anxiety   . Arthritis   . COPD (chronic obstructive pulmonary disease)   . GERD (gastroesophageal reflux disease)   . Hyperlipidemia   . Osteoporosis   . DVT (deep venous thrombosis)   . PE (pulmonary thromboembolism)   . Hypertension      PT DENIES....ON NO  MEDS  . Diabetes mellitus   . Cancer 1969    cervical  . Pneumonia     h/o  . Bronchitis     h/o  . Blood transfusion   . Anemia   . Bipolar affect, depressed   . Acute thromboembolism of deep veins of lower extremity 10/02/2009    Qualifier: Diagnosis of  By: [place], Bonnie    . Septic shock 12/25/2013  . 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 Vena cava filter placement    . 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 Eye surgery      CATARACTS  . Achilles tendon lengthening  11/18/11    and repair w/posterior tibial tendon lengthening; right  foot  . Cholecystectomy    . Abdominal hysterectomy  1969    "womb taken out for cervical cancer"  . Cataract extraction, bilateral  ~ 2008  . Tonsillectomy      "as a child"  .  Achilles tendon surgery  11/18/2011    Procedure: ACHILLES TENDON REPAIR;  Surgeon: Wylene Simmer, MD;  Location: [facility];  Service: Orthopedics;  Laterality: Right;  Right Posterior Tibial Tendon Lenghtening and Tendon Achilles Lenghtening       Medication List       This list is accurate as of: 05/30/15  7:54 PM.  Always use your most recent med list.               acetaminophen 325 MG tablet  Commonly known as:  TYLENOL  Take 650 mg by mouth every 4 (four) hours as needed for mild pain.     ALPRAZolam 1 MG tablet  Commonly known as:  XANAX  Take 1 tablet (1 mg total) by mouth 4 (four) times daily. Take at 0600, 1000, 1400, and 1800.     alum & mag hydroxide-simeth 200-200-20 MG/5ML suspension  Commonly known as:  MAALOX/MYLANTA  Take 30 mLs by mouth every 4 (four) hours as needed for indigestion.     antiseptic oral rinse Liqd  15 mLs by Mouth Rinse route every 4 (four) hours as needed for dry mouth.     apixaban 5 MG Tabs tablet  Commonly known as:  ELIQUIS  Take 2 tablets (10 mg total) by mouth 2 (two) times daily.  aspirin 81 MG chewable tablet  Chew 81 mg by mouth daily with breakfast.     bisacodyl 10 MG suppository  Commonly known as:  DULCOLAX  Place 10 mg rectally as directed. On Monday, Wednesday, and Friday and daily as needed if no bowel movement in 2 days.     CHLORASEPTIC 6-10 MG lozenge  Generic drug:  benzocaine-menthol  Take 1 lozenge by mouth every 2 (two) hours as needed for sore throat.     diltiazem 120 MG 24 hr capsule  Commonly known as:  CARDIZEM CD  Take 1 capsule (120 mg total) by mouth daily.     esomeprazole 40 MG capsule  Commonly known as:  NEXIUM  Take 40 mg by mouth daily before breakfast.     fenofibrate 145 MG tablet  Commonly known as:  TRICOR  Take 145 mg by mouth daily.     fish oil-omega-3 fatty acids 1000 MG capsule  Take 1 g by mouth 2 (two) times daily.     Fluticasone-Salmeterol 250-50 MCG/DOSE Aepb  Commonly known  as:  ADVAIR DISKUS  Inhale 1 puff into the lungs 2 (two) times daily.     gabapentin 300 MG capsule  Commonly known as:  NEURONTIN  Take 3 capsules (900 mg total) by mouth 3 (three) times daily.     imipenem-cilastatin 500 mg in sodium chloride 0.9 % 100 mL  Inject 500 mg into the vein every 8 (eight) hours.     insulin aspart 100 UNIT/ML FlexPen  Commonly known as:  NOVOLOG  Inject 3-5 Units into the skin 3 (three) times daily before meals. Give 3 units before meals with an additional 5 units for CBG> or < 150     insulin glargine 100 UNIT/ML injection  Commonly known as:  LANTUS  Inject 20-22 Units into the skin 2 (two) times daily. Takes 20 units in the morning and 22 units at bedtime     lamoTRIgine 150 MG tablet  Commonly known as:  LAMICTAL  Take 150 mg by mouth every morning.     levalbuterol 1.25 MG/0.5ML nebulizer solution  Commonly known as:  XOPENEX  Take 1.25 mg by nebulization 4 (four) times daily as needed for wheezing or shortness of breath.     levalbuterol 45 MCG/ACT inhaler  Commonly known as:  XOPENEX HFA  Inhale 2 puffs into the lungs every 4 (four) hours as needed for wheezing.     loperamide 2 MG capsule  Commonly known as:  IMODIUM  Take 2 mg by mouth as needed for diarrhea or loose stools.     loratadine 10 MG tablet  Commonly known as:  CLARITIN  Take 10 mg by mouth every morning.     magnesium hydroxide 400 MG/5ML suspension  Commonly known as:  MILK OF MAGNESIA  Take 30 mLs by mouth daily as needed for mild constipation.     metoprolol 50 MG tablet  Commonly known as:  LOPRESSOR  Take 1 tablet (50 mg total) by mouth 2 (two) times daily. At 0800 and 1700.     OxyCODONE 10 mg T12a 12 hr tablet  Commonly known as:  OXYCONTIN  Take one tablet by mouth every 12 hours PRN pain. Do not crush     OXYGEN  Inhale 2 L into the lungs continuous. Via nasal cannula.     polyethylene glycol packet  Commonly known as:  MIRALAX / GLYCOLAX  Take 17 g by  mouth daily.     polyvinyl alcohol  1.4 % ophthalmic solution  Commonly known as:  LIQUIFILM TEARS  Place 2 drops into both eyes 4 (four) times daily as needed for dry eyes.     potassium chloride 10 MEQ tablet  Commonly known as:  K-DUR,KLOR-CON  Take 5 mEq by mouth every evening.     QUEtiapine 50 MG tablet  Commonly known as:  SEROQUEL  Take 50 mg by mouth at bedtime. Take along with Seroquel 100mg  to = 150mg  at bedtime.     QUEtiapine 100 MG tablet  Commonly known as:  SEROQUEL  Take 100 mg by mouth at bedtime. Give 1 tablet along with Seroquel 50mg  to = 150mg  at bedtime     REFRESH LACRI-LUBE Oint  Place 1 application into both eyes at bedtime.     risperiDONE 2 MG tablet  Commonly known as:  RISPERDAL  Take 2 mg by mouth 2 (two) times daily.     simvastatin 10 MG tablet  Commonly known as:  ZOCOR  Take 10 mg by mouth daily at 6 PM.     sodium phosphate enema  Commonly known as:  FLEET  Place 1 enema rectally daily as needed (constipation). follow package directions     SYSTANE 0.4-0.3 % Soln  Generic drug:  Polyethyl Glycol-Propyl Glycol  Place 1 drop into both eyes 4 (four) times daily.     tiotropium 18 MCG inhalation capsule  Commonly known as:  SPIRIVA  Place 1 capsule (18 mcg total) into inhaler and inhale daily.     torsemide 20 MG tablet  Commonly known as:  DEMADEX  Take 40 mg by mouth daily with breakfast.     traZODone 150 MG tablet  Commonly known as:  DESYREL  Take 150 mg by mouth at bedtime.     UTI-STAT Liqd  Take 30 mLs by mouth every morning.     venlafaxine XR 150 MG 24 hr capsule  Commonly known as:  EFFEXOR-XR  Take 300 mg by mouth every morning.        No orders of the defined types were placed in this encounter.    Immunization History  Administered Date(s) Administered  . Influenza Split 10/20/2011  . Influenza Whole 10/05/2007, 09/16/2008, 11/17/2010, 10/01/2013  . Influenza,inj,Quad PF,36+ Mos 09/02/2014  .  Influenza-Unspecified 10/01/2014  . PPD Test 02/16/2012  . Pneumococcal Polysaccharide-23 10/21/2003, 11/17/2010, 01/27/2012  . Td 09/20/2003    History  Substance Use Topics  . Smoking status: Former Smoker -- 1.00 packs/day    Types: Cigarettes    Quit date: 11/14/1986  . Smokeless tobacco: Never Used     Comment: pt cannot recall the total amt of time she smoked.  . Alcohol Use: No    Family history is noncontributory    Review of Systems  DATA OBTAINED: from patient, nurse GENERAL:  no fevers,claims  fatigue, appetite changes SKIN: No itching, rash  EYES: No eye pain, redness, discharge EARS: No earache, tinnitus, change in hearing NOSE: No congestion, drainage or bleeding  MOUTH/THROAT: No mouth or tooth pain, No sore throat RESPIRATORY: No cough, wheezing, SOB CARDIAC: No chest pain, palpitations, lower extremity edema  GI: No abdominal pain, No N/V/D or constipation, No heartburn or reflux  GU: No dysuria, frequency or urgency, or incontinence  MUSCULOSKELETAL: No unrelieved bone/joint pain NEUROLOGIC: No headache, dizziness or focal weakness PSYCHIATRIC: No overt anxiety or sadness, No behavior issue.   Filed Vitals:   05/30/15 1936  BP: 127/80  Pulse: 82  Temp: 97 F (36.1  C)  Resp: 20    Physical Exam  GENERAL APPEARANCE: Alert, conversant,  No acute distress.  SKIN: No diaphoresis rash HEAD: Normocephalic, atraumatic  EYES: Conjunctiva/lids clear. Pupils round, reactive. EOMs intact.  EARS: External exam WNL, canals clear. Hearing grossly normal.  NOSE: No deformity or discharge.  MOUTH/THROAT: Lips w/o lesions  RESPIRATORY: Breathing is even, unlabored. Lung sounds are clear   CARDIOVASCULAR: Heart RRR no murmurs, rubs or gallops. Trace peripheral edema.   GASTROINTESTINAL: Abdomen is soft, non-tender, not distended w/ normal bowel sounds. GENITOURINARY: Bladder non tender, not distended  MUSCULOSKELETAL: No abnormal joints or  musculature NEUROLOGIC:  Cranial nerves 2-12 grossly intact. Moves all extremities  PSYCHIATRIC:  no behavioral issues  Patient Active Problem List   Diagnosis Date Noted  . DVT (deep venous thrombosis) 05/30/2015  . 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 Chronic respiratory failure 05/25/2015  . Psychosis 03/04/2015  . Bipolar depression 03/04/2015  . Candidiasis, intertrigo 02/07/2015  . Dysphagia, oropharyngeal 09/28/2014  . Neuropathy due to secondary diabetes 09/28/2014  . Esophageal dysphagia 08/09/2014  . Non compliance with medical treatment 08/09/2014  . MRSA cellulitis 02/20/2014  . Tardive dyskinesia 02/10/2014  . Infection due to ESBL-producing Escherichia coli 02/02/2014  . UTI 12/25/2013  . Diaper candidiasis 12/17/2013  . Dysuria 09/17/2013  . Pure hyperglyceridemia 06/18/2013  . Type 2 diabetes mellitus with neurological manifestations, uncontrolled 05/04/2013  . Chronic pain syndrome 05/04/2013  . Constipation 04/04/2013  . Venous stasis dermatitis 04/03/2012  . COPD exacerbation 02/21/2012  . Tachycardia 12/27/2011  . Thrush 11/10/2011  . Ankle arthritis 07/28/2011  . Chronic right shoulder pain 06/03/2011  . Leg swelling 03/03/2011  . INSOMNIA, CHRONIC 11/17/2010  . Other chronic pain 11/17/2010  . Presence of IVC filter 11/17/2010  . NEOPLASM UNCERTAIN BEHAVIOR MAJOR SALIV GLANDS 11/10/2009  . Personal history of PE (pulmonary embolism) 10/02/2009  . Pulmonary nodules 09/12/2009  . Wheelchair dependence 04/10/2009  . Chronic pulmonary heart disease 05/22/2008  . VENOUS INSUFFICIENCY, CHRONIC 03/25/2008  . SYMPTOM, INCONTINENCE, URINARY NOS 07/13/2007  . Anemia of chronic disease 05/11/2007  . GERD 05/11/2007  . Hyperlipidemia LDL goal <70 02/16/2007  . Obesity, diabetes, and hypertension syndrome 02/16/2007  . BIPOLAR DISORDER 02/16/2007  . Anxiety with obsessional features 02/16/2007  . HYPERTENSION, BENIGN SYSTEMIC 02/16/2007  . COPD (chronic obstructive  pulmonary disease) with emphysema 02/16/2007  . OSTEOPENIA 02/16/2007  . TOBACCO ABUSE, HX OF 02/16/2007    CBC    Component Value Date/Time   WBC 6.3 05/27/2015 0500   WBC 4.6 03/18/2015   RBC 3.72* 05/27/2015 0500   HGB 10.3* 05/27/2015 0500   HCT 34.9* 05/27/2015 0500   PLT 182 05/27/2015 0500   MCV 93.8 05/27/2015 0500   LYMPHSABS 1.8 03/16/2015 1943   MONOABS 0.5 03/16/2015 1943   EOSABS 0.3 03/16/2015 1943   BASOSABS 0.0 03/16/2015 1943    CMP     Component Value Date/Time   NA 139 05/25/2015 1128   NA 140 03/18/2015   K 3.8 05/25/2015 1128   CL 98* 05/25/2015 1128   CO2 32 05/25/2015 1128   GLUCOSE 183* 05/25/2015 1128   BUN 12 05/25/2015 1128   BUN 13 03/18/2015   CREATININE 0.48 05/25/2015 1128   CREATININE 0.73 03/24/2011 1614   CALCIUM 8.9 05/25/2015 1128   PROT 7.6 03/16/2015 1943   ALBUMIN 3.7 03/16/2015 1943   AST 21 03/16/2015 1943   ALT 17 03/16/2015 1943   ALKPHOS 55 03/16/2015 1943   BILITOT 0.5  03/16/2015 1943   GFRNONAA >60 05/25/2015 1128   GFRAA >60 05/25/2015 1128    Assessment and Plan  Tachycardia improved with increased dose of cardizem - could have been due to active infection    HYPERTENSION, BENIGN SYSTEMIC Continue metoprolol, Demadex, Cardizem  Hyperlipidemia LDL goal <70 Continue TriCor, fish oral supplement Zocor   DVT (deep venous thrombosis) Continue eliquis  UTI Continue TriCor, fish oral supplement Zocor   BIPOLAR DISORDER Current med regimen has been finely crafted by Psych and is working better than any prior;plan to continue    Hennie Duos, MD

## 2015-05-30 NOTE — Assessment & Plan Note (Signed)
Continue metoprolol, Demadex, Cardizem

## 2015-06-04 ENCOUNTER — Emergency Department (HOSPITAL_COMMUNITY)
Admission: EM | Admit: 2015-06-04 | Discharge: 2015-06-04 | Disposition: A | Discharge status: Home / Self Care | Payer: Medicare Other | Attending: Emergency Medicine | Admitting: Emergency Medicine

## 2015-06-04 ENCOUNTER — Emergency Department (HOSPITAL_COMMUNITY): Payer: Medicare Other

## 2015-06-04 ENCOUNTER — Encounter (HOSPITAL_COMMUNITY): Payer: Self-pay | Admitting: *Deleted

## 2015-06-04 DIAGNOSIS — I10 Essential (primary) hypertension: Secondary | ICD-10-CM | POA: Diagnosis not present

## 2015-06-04 DIAGNOSIS — E785 Hyperlipidemia, unspecified: Secondary | ICD-10-CM | POA: Diagnosis not present

## 2015-06-04 DIAGNOSIS — R000 Tachycardia, unspecified: Secondary | ICD-10-CM | POA: Insufficient documentation

## 2015-06-04 DIAGNOSIS — Z794 Long term (current) use of insulin: Secondary | ICD-10-CM | POA: Insufficient documentation

## 2015-06-04 DIAGNOSIS — M1990 Unspecified osteoarthritis, unspecified site: Secondary | ICD-10-CM | POA: Insufficient documentation

## 2015-06-04 DIAGNOSIS — F419 Anxiety disorder, unspecified: Secondary | ICD-10-CM | POA: Diagnosis not present

## 2015-06-04 DIAGNOSIS — J449 Chronic obstructive pulmonary disease, unspecified: Secondary | ICD-10-CM | POA: Insufficient documentation

## 2015-06-04 DIAGNOSIS — K219 Gastro-esophageal reflux disease without esophagitis: Secondary | ICD-10-CM | POA: Insufficient documentation

## 2015-06-04 DIAGNOSIS — Z8619 Personal history of other infectious and parasitic diseases: Secondary | ICD-10-CM | POA: Insufficient documentation

## 2015-06-04 DIAGNOSIS — R531 Weakness: Secondary | ICD-10-CM

## 2015-06-04 DIAGNOSIS — Z86718 Personal history of other venous thrombosis and embolism: Secondary | ICD-10-CM | POA: Insufficient documentation

## 2015-06-04 DIAGNOSIS — Z7982 Long term (current) use of aspirin: Secondary | ICD-10-CM | POA: Diagnosis not present

## 2015-06-04 DIAGNOSIS — Z862 Personal history of diseases of the blood and blood-forming organs and certain disorders involving the immune mechanism: Secondary | ICD-10-CM | POA: Insufficient documentation

## 2015-06-04 DIAGNOSIS — Z87891 Personal history of nicotine dependence: Secondary | ICD-10-CM | POA: Diagnosis not present

## 2015-06-04 DIAGNOSIS — Z7901 Long term (current) use of anticoagulants: Secondary | ICD-10-CM | POA: Diagnosis not present

## 2015-06-04 DIAGNOSIS — E119 Type 2 diabetes mellitus without complications: Secondary | ICD-10-CM | POA: Diagnosis not present

## 2015-06-04 DIAGNOSIS — Z8701 Personal history of pneumonia (recurrent): Secondary | ICD-10-CM | POA: Diagnosis not present

## 2015-06-04 DIAGNOSIS — Z86711 Personal history of pulmonary embolism: Secondary | ICD-10-CM | POA: Insufficient documentation

## 2015-06-04 DIAGNOSIS — Z22322 Carrier or suspected carrier of Methicillin resistant Staphylococcus aureus: Secondary | ICD-10-CM | POA: Diagnosis not present

## 2015-06-04 DIAGNOSIS — Z8541 Personal history of malignant neoplasm of cervix uteri: Secondary | ICD-10-CM | POA: Insufficient documentation

## 2015-06-04 LAB — CBC WITH DIFFERENTIAL/PLATELET
BASOS PCT: 0 % (ref 0–1)
Basophils Absolute: 0 10*3/uL (ref 0.0–0.1)
EOS PCT: 4 % (ref 0–5)
Eosinophils Absolute: 0.2 10*3/uL (ref 0.0–0.7)
HEMATOCRIT: 42.8 % (ref 36.0–46.0)
HEMOGLOBIN: 12.4 g/dL (ref 12.0–15.0)
Lymphocytes Relative: 39 % (ref 12–46)
Lymphs Abs: 1.9 10*3/uL (ref 0.7–4.0)
MCH: 27.4 pg (ref 26.0–34.0)
MCHC: 29 g/dL — AB (ref 30.0–36.0)
MCV: 94.7 fL (ref 78.0–100.0)
MONO ABS: 0.4 10*3/uL (ref 0.1–1.0)
MONOS PCT: 8 % (ref 3–12)
Neutro Abs: 2.4 10*3/uL (ref 1.7–7.7)
Neutrophils Relative %: 49 % (ref 43–77)
Platelets: 242 10*3/uL (ref 150–400)
RBC: 4.52 MIL/uL (ref 3.87–5.11)
RDW: 15 % (ref 11.5–15.5)
WBC: 4.9 10*3/uL (ref 4.0–10.5)

## 2015-06-04 LAB — COMPREHENSIVE METABOLIC PANEL
ALBUMIN: 4.3 g/dL (ref 3.5–5.0)
ALT: 31 U/L (ref 14–54)
AST: 40 U/L (ref 15–41)
Alkaline Phosphatase: 54 U/L (ref 38–126)
Anion gap: 9 (ref 5–15)
BILIRUBIN TOTAL: 0.1 mg/dL — AB (ref 0.3–1.2)
BUN: 11 mg/dL (ref 6–20)
CO2: 35 mmol/L — AB (ref 22–32)
Calcium: 9.4 mg/dL (ref 8.9–10.3)
Chloride: 96 mmol/L — ABNORMAL LOW (ref 101–111)
Creatinine, Ser: 0.65 mg/dL (ref 0.44–1.00)
GFR calc Af Amer: >60 mL/min (ref >60)
GLUCOSE: 124 mg/dL — AB (ref 65–99)
POTASSIUM: 3.7 mmol/L (ref 3.5–5.1)
Sodium: 140 mmol/L (ref 135–145)
Total Protein: 8.2 g/dL — ABNORMAL HIGH (ref 6.5–8.1)

## 2015-06-04 LAB — I-STAT TROPONIN, ED: Troponin i, poc: 0 ng/mL (ref 0.00–0.08)

## 2015-06-04 LAB — URINALYSIS, ROUTINE W REFLEX MICROSCOPIC
Glucose, UA: NEGATIVE mg/dL
HGB URINE DIPSTICK: NEGATIVE
Ketones, ur: NEGATIVE mg/dL
Leukocytes, UA: NEGATIVE
Nitrite: NEGATIVE
Protein, ur: NEGATIVE mg/dL
Specific Gravity, Urine: 1.015 (ref 1.005–1.030)
Urobilinogen, UA: 1 mg/dL (ref 0.0–1.0)
pH: 6 (ref 5.0–8.0)

## 2015-06-04 LAB — I-STAT CG4 LACTIC ACID, ED: Lactic Acid, Venous: 1.47 mmol/L (ref 0.5–2.0)

## 2015-06-04 MED ORDER — SODIUM CHLORIDE 0.9 % IV SOLN
Freq: Once | INTRAVENOUS | Status: AC
  Administered 2015-06-04: 13:00:00 via INTRAVENOUS

## 2015-06-04 NOTE — ED Notes (Signed)
Lab draw unsuccessful 

## 2015-06-04 NOTE — ED Notes (Signed)
Patient was sent out from her facility for weakness. Patient is currently being treatment with iv antibiotics via PICC line. According the the patient's daughter she has felt worse since starting antibiotics. Patient also complains that there is "something" in her mouth.

## 2015-06-04 NOTE — ED Provider Notes (Signed)
CSN: [id number]     Arrival date & time 06/04/15  2409 History   First MD Initiated Contact with Patient 06/04/15 1009     Chief Complaint  Patient presents with  . Weakness     (Consider location/radiation/quality/duration/timing/severity/associated sxs/prior Treatment) HPI Comments: The patient is a 74 year old female, she comes from a skilled nursing facility with a complaint of generalized weakness, it is unclear exactly when this started, the patient is not a very good historian but does state that over the last several days she has had decreased appetite, she feels generally weak but denies focal weakness. She was recently admitted to the hospital with sinus tachycardia," getting Cardizem and in about X for urinary tract infection. This was a multidrug resistant Escherichia coli which required a peripherally inserted central catheter and 7 days and then about X. She eventually improved. At this time she states that her generalized weakness is persistent, nothing makes this better or worse, she is nonambulatory at baseline using a wheelchair. She is able answer questions appropriately  The history is provided by the patient.    Past Medical History  Diagnosis Date  . Anxiety   . Arthritis   . COPD (chronic obstructive pulmonary disease)   . GERD (gastroesophageal reflux disease)   . Hyperlipidemia   . Osteoporosis   . DVT (deep venous thrombosis)   . PE (pulmonary thromboembolism)   . Hypertension      PT DENIES....ON NO  MEDS  . Diabetes mellitus   . Cancer 1969    cervical  . Pneumonia     h/o  . Bronchitis     h/o  . Blood transfusion   . Anemia   . Bipolar affect, depressed   . Acute thromboembolism of deep veins of lower extremity 10/02/2009    Qualifier: Diagnosis of  By: Martinique, Bonnie    . Septic shock 12/25/2013  . 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 Vena cava filter placement    . 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 Eye surgery      CATARACTS  . Achilles tendon lengthening  11/18/11    and repair w/posterior tibial tendon lengthening; right  foot  . Cholecystectomy    . Abdominal hysterectomy  1969    "womb taken out for cervical cancer"  . Cataract extraction, bilateral  ~ 2008  . Tonsillectomy      "as a child"  . Achilles tendon surgery  11/18/2011    Procedure: ACHILLES TENDON REPAIR;  Surgeon: Wylene Simmer, MD;  Location: [facility];  Service: Orthopedics;  Laterality: Right;  Right Posterior Tibial Tendon Lenghtening and Tendon Achilles Lenghtening    Family History  Problem Relation Age of Onset  . Heart disease Father   . Heart attack Father   . Clotting disorder Father   . Stroke Mother    History  Substance Use Topics  . Smoking status: Former Smoker -- 1.00 packs/day    Types: Cigarettes    Quit date: 11/14/1986  . Smokeless tobacco: Never Used     Comment: pt cannot recall the total amt of time she smoked.  . Alcohol Use: No   OB History    No data available     Review of Systems  All other systems reviewed and are negative.     Allergies  Benzodiazepines; Morphine and related; Olanzapine; and Sulfamethoxazole-trimethoprim  Home Medications   Prior to  Admission medications   Medication Sig Start Date End Date Taking? Authorizing Provider  acetaminophen (TYLENOL) 325 MG tablet Take 650 mg by mouth every 4 (four) hours as needed for mild pain.   Yes Historical Provider, MD  ALPRAZolam Duanne Moron) 1 MG tablet Take 1 tablet (1 mg total) by mouth 4 (four) times daily. Take at 0600, 1000, 1400, and 1800. 05/28/15  Yes Velvet Bathe, MD  alum & mag hydroxide-simeth (MAALOX/MYLANTA) 200-200-20 MG/5ML suspension Take 30 mLs by mouth every 4 (four) hours as needed for indigestion.   Yes Historical Provider, MD  antiseptic oral rinse (BIOTENE) LIQD 15 mLs by Mouth Rinse route every 4 (four) hours as needed for dry mouth.   Yes Historical Provider, MD  apixaban (ELIQUIS) 5 MG TABS  tablet Take 2 tablets (10 mg total) by mouth 2 (two) times daily. 03/16/15  Yes Heather Laisure, PA-C  ARTIFICIAL TEAR OINTMENT OP Place 1 application into both eyes at bedtime.   Yes Historical Provider, MD  aspirin 81 MG chewable tablet Chew 81 mg by mouth daily with breakfast.    Yes Historical Provider, MD  benzocaine-menthol (CHLORASEPTIC) 6-10 MG lozenge Take 1 lozenge by mouth every 2 (two) hours as needed for sore throat.   Yes Historical Provider, MD  bisacodyl (DULCOLAX) 10 MG suppository Place 10 mg rectally as directed. insert three times a week- Monday, Wednesday and Friday, and if no bowel movement place 1 every 2 days   Yes Historical Provider, MD  Cranberry-Vitamin C-Inulin (UTI-STAT) LIQD Take 30 mLs by mouth every morning.    Yes Historical Provider, MD  diltiazem (CARDIZEM CD) 120 MG 24 hr capsule Take 1 capsule (120 mg total) by mouth daily. 05/28/15  Yes Velvet Bathe, MD  esomeprazole (NEXIUM) 40 MG capsule Take 40 mg by mouth daily before breakfast.    Yes Historical Provider, MD  fenofibrate (TRICOR) 145 MG tablet Take 145 mg by mouth daily.   Yes Historical Provider, MD  gabapentin (NEURONTIN) 300 MG capsule Take 3 capsules (900 mg total) by mouth 3 (three) times daily. 01/01/14  Yes Geradine Girt, DO  GLUCERNA (GLUCERNA) LIQD Take 1 Can by mouth 2 (two) times daily between meals.   Yes Historical Provider, MD  insulin aspart (NOVOLOG) 100 UNIT/ML FlexPen Inject 3-5 Units into the skin 3 (three) times daily before meals. Give 3 units before meals with an additional 5 units for CBG> or < 150   Yes Historical Provider, MD  insulin glargine (LANTUS) 100 UNIT/ML injection Inject 20-22 Units into the skin 2 (two) times daily. Takes 20 units in the morning and 22 units at bedtime 01/01/14  Yes Geradine Girt, DO  lamoTRIgine (LAMICTAL) 150 MG tablet Take 150 mg by mouth every morning.    Yes Historical Provider, MD  levalbuterol [facility] HFA) 45 MCG/ACT inhaler Inhale 2 puffs into the  lungs every 4 (four) hours as needed for wheezing.    Yes Historical Provider, MD  levalbuterol (XOPENEX) 1.25 MG/0.5ML nebulizer solution Take 1.25 mg by nebulization 4 (four) times daily as needed for wheezing or shortness of breath. 01/01/14  Yes Geradine Girt, DO  loperamide (IMODIUM) 2 MG capsule Take 2 mg by mouth as needed for diarrhea or loose stools (do not exceed 8mg  /24 hours).    Yes Historical Provider, MD  loratadine (CLARITIN) 10 MG tablet Take 10 mg by mouth every morning.    Yes Historical Provider, MD  magnesium hydroxide (MILK OF MAGNESIA) 400 MG/5ML suspension Take 30 mLs  by mouth daily as needed for mild constipation.   Yes Historical Provider, MD  metoprolol tartrate (LOPRESSOR) 50 MG tablet Take 1 tablet (50 mg total) by mouth 2 (two) times daily. At 0800 and 1700. 05/28/15  Yes Velvet Bathe, MD  Omega-3 Fatty Acids (FISH OIL) 1000 MG CAPS Take 1 capsule by mouth 2 (two) times daily.   Yes Historical Provider, MD  OxyCODONE (OXYCONTIN) 10 mg T12A 12 hr tablet Take one tablet by mouth every 12 hours PRN pain. Do not crush Patient taking differently: Take 10 mg by mouth every 12 (twelve) hours.  05/28/15  Yes Velvet Bathe, MD  Polyethyl Glycol-Propyl Glycol (SYSTANE) 0.4-0.3 % SOLN Place 1 drop into both eyes 4 (four) times daily.   Yes Historical Provider, MD  polyethylene glycol (MIRALAX / GLYCOLAX) packet Take 17 g by mouth daily.    Yes Historical Provider, MD  polyvinyl alcohol (ARTIFICIAL TEARS) 1.4 % ophthalmic solution Place 1 drop into both eyes 4 (four) times daily as needed for dry eyes.   Yes Historical Provider, MD  polyvinyl alcohol (LIQUIFILM TEARS) 1.4 % ophthalmic solution Place 2 drops into both eyes 4 (four) times daily as needed for dry eyes.   Yes Historical Provider, MD  potassium chloride (K-DUR,KLOR-CON) 10 MEQ tablet Take 5 mEq by mouth every evening.  08/25/11  Yes Judithann Sheen, MD  QUEtiapine (SEROQUEL) 100 MG tablet Take 100 mg by mouth at bedtime. Give 1  tablet along with Seroquel 50mg  to = 150mg  at bedtime   Yes Historical Provider, MD  QUEtiapine (SEROQUEL) 50 MG tablet Take 50 mg by mouth at bedtime. Take along with Seroquel 100mg  to = 150mg  at bedtime.   Yes Historical Provider, MD  risperiDONE (RISPERDAL) 2 MG tablet Take 2 mg by mouth 2 (two) times daily.   Yes Historical Provider, MD  simvastatin (ZOCOR) 10 MG tablet Take 10 mg by mouth daily at 6 PM.    Yes Historical Provider, MD  sodium phosphate (FLEET) enema Place 1 enema rectally daily as needed (constipation). follow package directions   Yes Historical Provider, MD  tiotropium (SPIRIVA) 18 MCG inhalation capsule Place 1 capsule (18 mcg total) into inhaler and inhale daily. 08/25/11  Yes Judithann Sheen, MD  torsemide (DEMADEX) 20 MG tablet Take 40 mg by mouth daily with breakfast.    Yes Historical Provider, MD  traZODone (DESYREL) 150 MG tablet Take 150 mg by mouth at bedtime.   Yes Historical Provider, MD  venlafaxine XR (EFFEXOR-XR) 150 MG 24 hr capsule Take 300 mg by mouth every morning.  01/31/12  Yes Waldemar Dickens, MD  Artificial Tear Ointment (REFRESH LACRI-LUBE) OINT Place 1 application into both eyes at bedtime.    Historical Provider, MD  fish oil-omega-3 fatty acids 1000 MG capsule Take 1 g by mouth 2 (two) times daily.     Historical Provider, MD  Fluticasone-Salmeterol (ADVAIR DISKUS) 250-50 MCG/DOSE AEPB Inhale 1 puff into the lungs 2 (two) times daily. 03/11/14   Kathee Delton, MD  imipenem-cilastatin 500 mg in sodium chloride 0.9 % 100 mL Inject 500 mg into the vein every 8 (eight) hours. 05/28/15   Velvet Bathe, MD  OXYGEN Inhale 2 L into the lungs continuous. Via nasal cannula.    Historical Provider, MD   BP 105/71 mmHg  Pulse 109  Temp(Src) 98 F (36.7 C) (Oral)  Resp 20  SpO2 96% Physical Exam  Constitutional: She appears well-developed and well-nourished. No distress.  HENT:  Head: Normocephalic  and atraumatic.  Mouth/Throat: No oropharyngeal exudate.   Mucous membranes mildly dehydrated  Eyes: Conjunctivae and EOM are normal. Pupils are equal, round, and reactive to light. Right eye exhibits no discharge. Left eye exhibits no discharge. No scleral icterus.  Neck: Normal range of motion. Neck supple. No JVD present. No thyromegaly present.  Cardiovascular: Regular rhythm, normal heart sounds and intact distal pulses.  Exam reveals no gallop and no friction rub.   No murmur heard. Tachycardia at 105 bpm  Pulmonary/Chest: Effort normal and breath sounds normal. No respiratory distress. She has no wheezes. She has no rales.  Abdominal: Soft. Bowel sounds are normal. She exhibits no distension and no mass. There is no tenderness.  Obese nontender, no masses  Musculoskeletal: Normal range of motion. She exhibits no edema or tenderness.  Lymphadenopathy:    She has no cervical adenopathy.  Neurological: She is alert. Coordination normal.  Awake, able to lift bilateral lower extremities off the bed and straight leg raise, normal grips bilaterally, cranial nerves III through XII appear normal, speech is clear, memory is intact  Skin: Skin is warm and dry. No rash noted. No erythema.  Psychiatric: She has a normal mood and affect. Her behavior is normal.  Nursing note and vitals reviewed.   ED Course  Procedures (including critical care time) Labs Review Labs Reviewed  COMPREHENSIVE METABOLIC PANEL - Abnormal; Notable for the following:    Chloride 96 (*)    CO2 35 (*)    Glucose, Bld 124 (*)    Total Protein 8.2 (*)    Total Bilirubin 0.1 (*)    All other components within normal limits  CBC WITH DIFFERENTIAL/PLATELET - Abnormal; Notable for the following:    MCHC 29.0 (*)    All other components within normal limits  URINALYSIS, ROUTINE W REFLEX MICROSCOPIC (NOT AT [facility]) - Abnormal; Notable for the following:    Bilirubin Urine SMALL (*)    All other components within normal limits  URINE CULTURE  I-STAT TROPOININ, ED  I-STAT CG4  LACTIC ACID, ED    Imaging Review Dg Chest Port 1 View  06/04/2015   CLINICAL DATA:  Weakness and shortness of breath today. History of COPD. Former smoker.  EXAM: PORTABLE CHEST - 1 VIEW  COMPARISON:  05/25/2015  FINDINGS: The patient is rotated to the right which partially limits evaluation of the mediastinum. Cardiomediastinal silhouette is grossly unchanged. The lungs are hypoinflated with mild linear opacities in the lung bases most consistent with atelectasis. There is mild central pulmonary vascular congestion. Diffuse interstitial densities on the prior study have largely resolved. There is no evidence of overt pulmonary edema, segmental airspace consolidation, definite pleural effusion, or pneumothorax. Patient's chin obscures the right lung apex. No acute osseous abnormality is identified.  IMPRESSION: Bibasilar subsegmental atelectasis.   Electronically Signed   By: Logan Bores   On: 06/04/2015 11:42     EKG Interpretation   Date/Time:  Wednesday June 04 2015 10:08:17 EDT Ventricular Rate:  97 PR Interval:  181 QRS Duration: 94 QT Interval:  367 QTC Calculation: 466 R Axis:   33 Text Interpretation:  Sinus rhythm Low voltage, precordial leads Baseline  wander in lead(s) V4 V5 V6 since last tracing no significant change  Abnormal ekg Confirmed by Alissa Pharr  MD, Reata Petrov ([id number]) on 06/04/2015 10:36:31  AM      MDM   Final diagnoses:  Weakness  Generalized weakness    The patient has a generalized weakness, there are no focal  findings on exam other than a mild tachycardia, recent urinary tract infection, we'll check labs including a urinalysis to rule out recurrent infection, otherwise vital signs are unremarkable with no hypotension or fever, hydrate gently given history of CHF, reevaluate.  Labs, imaging and EKG without acute findings, the patient has no infection in her urine, normal specific gravity and no ketones or proteinuria. Her labs are otherwise unremarkable, she is  more concerned because she has not eaten in several days and is very hungry, she will be given food and discharged back to her skilled nursing facility. She appears stable for transfer back to the facility.  Meds given in ED:  Medications  0.9 %  sodium chloride infusion ( Intravenous New Bag/Given 06/04/15 1231)      Noemi Chapel, MD 06/04/15 [phone]

## 2015-06-04 NOTE — ED Notes (Signed)
Pt actually i/o cath for urine collect

## 2015-06-04 NOTE — ED Notes (Signed)
Bed: WA17 Expected date:  Expected time:  Means of arrival:  Comments: Drift and droop from [employer]

## 2015-06-05 LAB — URINE CULTURE: Culture: NO GROWTH

## 2015-06-27 IMAGING — DX DG CHEST 1V PORT
1 series · 1 of 1 positions shown · non-contrast
Comparison: Two-view chest x-ray 09/12/2014

CLINICAL DATA: Tachycardia.

EXAM:
PORTABLE CHEST - 1 VIEW

[chest ap]
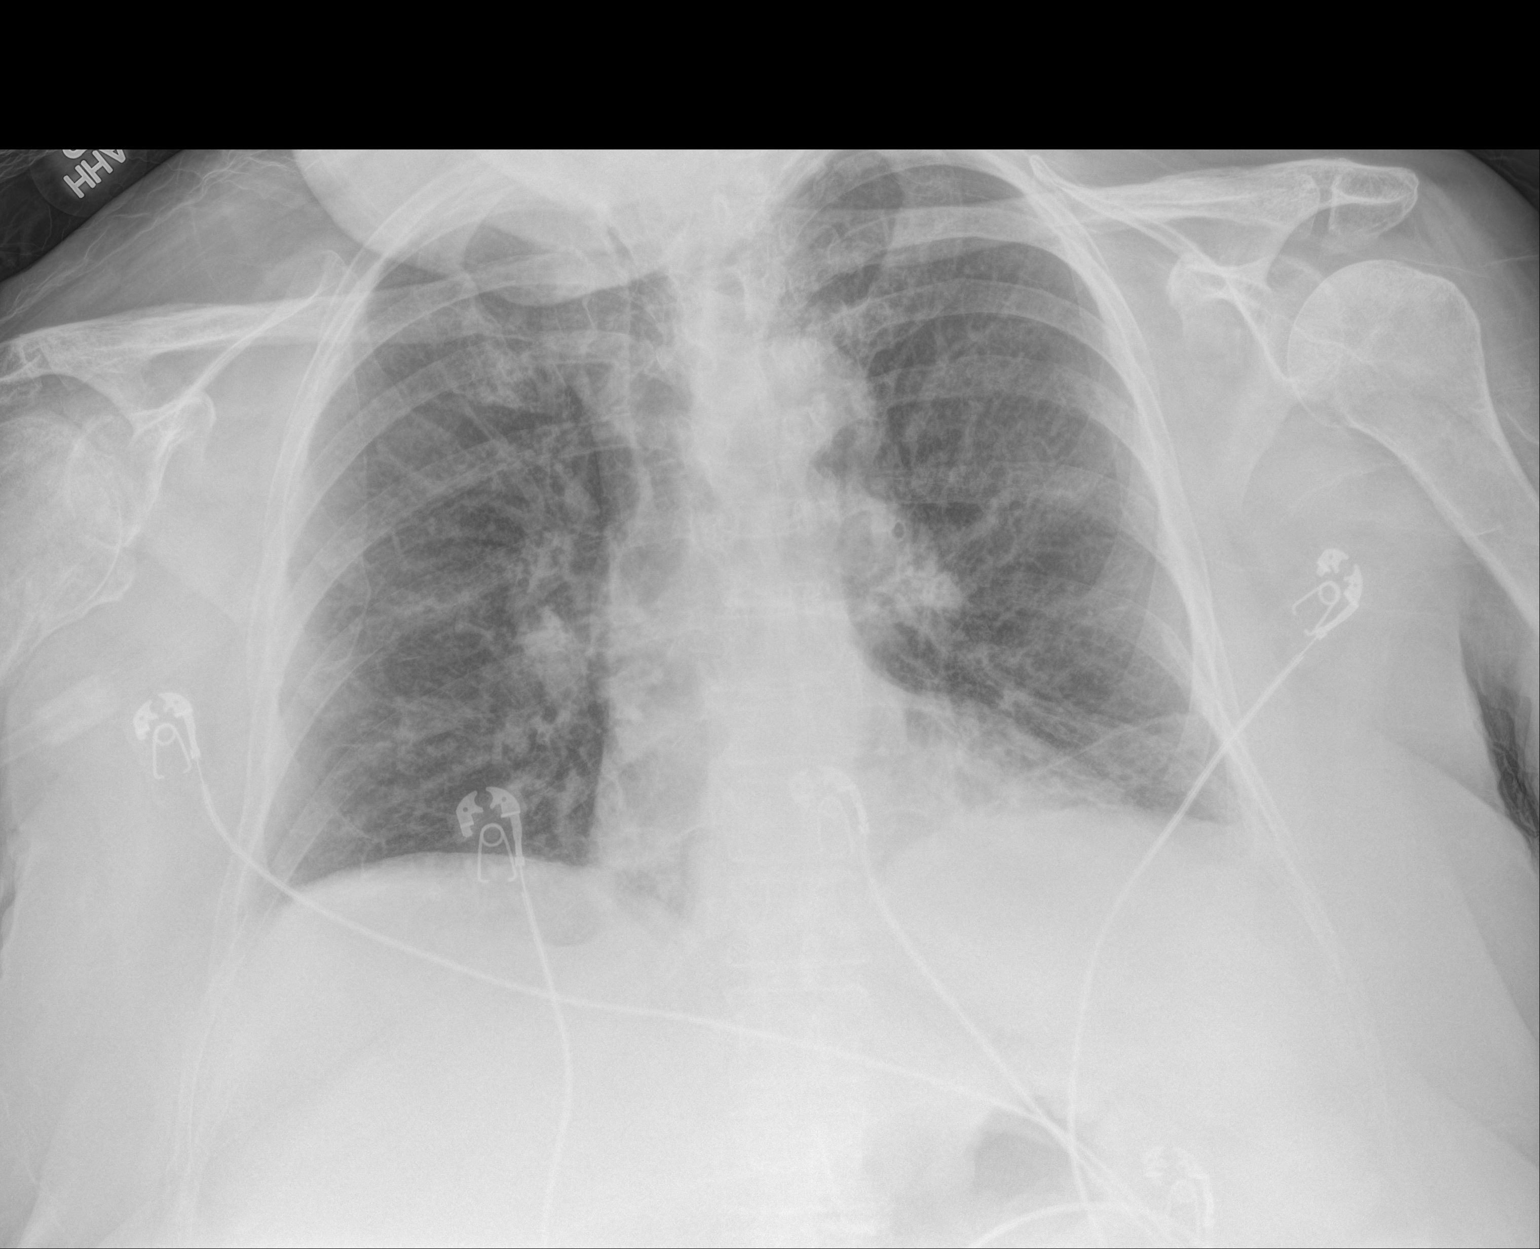

[1 of 1 positions shown; findings below may reference images not displayed]

FINDINGS: The heart size is normal. The diffuse interstitial pattern is
increased. Mild pulmonary vascular congestion is noted. Mild left
basilar airspace opacity is noted. Degenerative changes are again
seen in the shoulders, worse on the right.
IMPRESSION: 1. Increased diffuse interstitial pattern suggesting edema and mild
pulmonary vascular congestion.
2. Asymmetric left basilar airspace disease. While this likely
reflects atelectasis, early infection is also considered.

## 2015-07-03 ENCOUNTER — Non-Acute Institutional Stay (SKILLED_NURSING_FACILITY): Payer: Medicare Other | Admitting: Internal Medicine

## 2015-07-03 ENCOUNTER — Encounter: Payer: Self-pay | Admitting: Internal Medicine

## 2015-07-03 DIAGNOSIS — E1165 Type 2 diabetes mellitus with hyperglycemia: Secondary | ICD-10-CM

## 2015-07-03 DIAGNOSIS — F319 Bipolar disorder, unspecified: Secondary | ICD-10-CM | POA: Diagnosis not present

## 2015-07-03 DIAGNOSIS — I82409 Acute embolism and thrombosis of unspecified deep veins of unspecified lower extremity: Secondary | ICD-10-CM

## 2015-07-03 DIAGNOSIS — J9610 Chronic respiratory failure, unspecified whether with hypoxia or hypercapnia: Secondary | ICD-10-CM | POA: Diagnosis not present

## 2015-07-03 DIAGNOSIS — I10 Essential (primary) hypertension: Secondary | ICD-10-CM

## 2015-07-03 DIAGNOSIS — E1149 Type 2 diabetes mellitus with other diabetic neurological complication: Secondary | ICD-10-CM

## 2015-07-03 DIAGNOSIS — E1141 Type 2 diabetes mellitus with diabetic mononeuropathy: Secondary | ICD-10-CM | POA: Diagnosis not present

## 2015-07-03 DIAGNOSIS — IMO0002 Reserved for concepts with insufficient information to code with codable children: Secondary | ICD-10-CM

## 2015-07-03 DIAGNOSIS — R000 Tachycardia, unspecified: Secondary | ICD-10-CM | POA: Diagnosis not present

## 2015-07-03 NOTE — Progress Notes (Signed)
Patient ID: [name], female   DOB: 11-17-1941, 74 y.o.   MRN: [id number] MRN: [id number] Name: [name]  Sex: female Age: 74 y.o. DOB: [birth year]  PSC #: Kyra Searles Facility/Room:217 Level Of Care: SNF Provider: Roena Malady Emergency Contacts: Extended Emergency Contact Information Primary Emergency Contact: Wright,[name] Address: [address]          [place], [place] [address] [place] Work Phone: [phone] Mobile Phone: [phone] Relation: Daughter Secondary Emergency Contact: Dennard Nip [place] Home Phone: [phone] Relation: Sister  Code Status:FULL   Allergies: Benzodiazepines; Morphine and related; Olanzapine; and Sulfamethoxazole-trimethoprim  Chief Complaint  Patient presents with  . Medical Management of Chronic Issues    HPI: Patient is 74 y.o. female. With a very complex medical history including respiratory failure-tachycardia hypertension bipolar disorder history of DVT-she actually has been relatively stable I do note she went to the ER last month complaining of generalized weakness but no acute cause was found   Her family has left a note about her right great toe they would like that assessed for any infection-  She was hospitalized most recently for tachycardia and also a UTI and she did have this treated aggressively UTI without about aches.  Regards tachycardia her Cardizem was increased and this appears to have stabilized the situation.  Currently patient has no complaints nursing staff does not report any recent acute issues.  Patient is still somewhat sleepy at this hour but that is not new according to nursing staff since it is early evening  Past Medical History  Diagnosis Date  . Anxiety   . Arthritis   . COPD (chronic obstructive pulmonary disease)   . GERD (gastroesophageal reflux disease)   . Hyperlipidemia   . Osteoporosis   . DVT (deep venous thrombosis)   . PE (pulmonary  thromboembolism)   . Hypertension      PT DENIES....ON NO  MEDS  . Diabetes mellitus   . Cancer 1969    cervical  . Pneumonia     h/o  . Bronchitis     h/o  . Blood transfusion   . Anemia   . Bipolar affect, depressed   . Acute thromboembolism of deep veins of lower extremity 10/02/2009    Qualifier: Diagnosis of  By: [place], Bonnie    . Septic shock 12/25/2013  . 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 Vena cava filter placement    . 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 Eye surgery      CATARACTS  . Achilles tendon lengthening  11/18/11    and repair w/posterior tibial tendon lengthening; right  foot  . Cholecystectomy    . Abdominal hysterectomy  1969    "womb taken out for cervical cancer"  . Cataract extraction, bilateral  ~ 2008  . Tonsillectomy      "as a child"  . Achilles tendon surgery  11/18/2011    Procedure: ACHILLES TENDON REPAIR;  Surgeon: Toni Arthurs, MD;  Location: [facility] OR;  Service: Orthopedics;  Laterality: Right;  Right Posterior Tibial Tendon Lenghtening and Tendon Achilles Lenghtening       Medication List       This list is accurate as of: 07/03/15 11:59 PM.  Always use your most recent med list.               acetaminophen 325 MG tablet  Commonly known as:  TYLENOL  Take 650 mg by mouth every 4 (four) hours as needed for mild pain.     ALPRAZolam 1 MG tablet  Commonly known as:  XANAX  Take 1 tablet (1 mg total) by mouth 4 (four) times daily. Take at 0600, 1000, 1400, and 1800.     alum & mag hydroxide-simeth 200-200-20 MG/5ML suspension  Commonly known as:  MAALOX/MYLANTA  Take 30 mLs by mouth every 4 (four) hours as needed for indigestion.     antiseptic oral rinse Liqd  15 mLs by Mouth Rinse route every 4 (four) hours as needed for dry mouth.     apixaban 5 MG Tabs tablet  Commonly known as:  ELIQUIS  Take 2 tablets (10 mg total) by mouth 2 (two) times daily.     aspirin 81 MG chewable  tablet  Chew 81 mg by mouth daily with breakfast.     bisacodyl 10 MG suppository  Commonly known as:  DULCOLAX  Place 10 mg rectally as directed. insert three times a week- Monday, Wednesday and Friday, and if no bowel movement place 1 every 2 days     CHLORASEPTIC 6-10 MG lozenge  Generic drug:  benzocaine-menthol  Take 1 lozenge by mouth every 2 (two) hours as needed for sore throat.     diltiazem 120 MG 24 hr capsule  Commonly known as:  CARDIZEM CD  Take 1 capsule (120 mg total) by mouth daily.     esomeprazole 40 MG capsule  Commonly known as:  NEXIUM  Take 40 mg by mouth daily before breakfast.     fenofibrate 145 MG tablet  Commonly known as:  TRICOR  Take 145 mg by mouth daily.     Fish Oil 1000 MG Caps  Take 1 capsule by mouth 2 (two) times daily.     fish oil-omega-3 fatty acids 1000 MG capsule  Take 1 g by mouth 2 (two) times daily.     Fluticasone-Salmeterol 250-50 MCG/DOSE Aepb  Commonly known as:  ADVAIR DISKUS  Inhale 1 puff into the lungs 2 (two) times daily.     gabapentin 300 MG capsule  Commonly known as:  NEURONTIN  Take 3 capsules (900 mg total) by mouth 3 (three) times daily.     GLUCERNA Liqd  Take 1 Can by mouth 2 (two) times daily between meals.     imipenem-cilastatin 500 mg in sodium chloride 0.9 % 100 mL  Inject 500 mg into the vein every 8 (eight) hours.     insulin aspart 100 UNIT/ML FlexPen  Commonly known as:  NOVOLOG  Inject 3-5 Units into the skin 3 (three) times daily before meals. Give 3 units before meals with an additional 5 units for CBG> or < 150     insulin glargine 100 UNIT/ML injection  Commonly known as:  LANTUS  Inject 20-22 Units into the skin 2 (two) times daily. Takes 20 units in the morning and 22 units at bedtime     lamoTRIgine 150 MG tablet  Commonly known as:  LAMICTAL  Take 150 mg by mouth every morning.     levalbuterol 1.25 MG/0.5ML nebulizer solution  Commonly known as:  XOPENEX  Take 1.25 mg by  nebulization 4 (four) times daily as needed for wheezing or shortness of breath.     levalbuterol 45 MCG/ACT inhaler  Commonly known as:  XOPENEX HFA  Inhale 2 puffs into the lungs every 4 (four) hours as needed for wheezing.  loperamide 2 MG capsule  Commonly known as:  IMODIUM  Take 2 mg by mouth as needed for diarrhea or loose stools (do not exceed 8mg  /24 hours).     loratadine 10 MG tablet  Commonly known as:  CLARITIN  Take 10 mg by mouth every morning.     magnesium hydroxide 400 MG/5ML suspension  Commonly known as:  MILK OF MAGNESIA  Take 30 mLs by mouth daily as needed for mild constipation.     metoprolol 50 MG tablet  Commonly known as:  LOPRESSOR  Take 1 tablet (50 mg total) by mouth 2 (two) times daily. At 0800 and 1700.     OxyCODONE 10 mg T12a 12 hr tablet  Commonly known as:  OXYCONTIN  Take one tablet by mouth every 12 hours PRN pain. Do not crush     OXYGEN  Inhale 2 L into the lungs continuous. Via nasal cannula.     polyethylene glycol packet  Commonly known as:  MIRALAX / GLYCOLAX  Take 17 g by mouth daily.     polyvinyl alcohol 1.4 % ophthalmic solution  Commonly known as:  LIQUIFILM TEARS  Place 2 drops into both eyes 4 (four) times daily as needed for dry eyes.     ARTIFICIAL TEARS 1.4 % ophthalmic solution  Generic drug:  polyvinyl alcohol  Place 1 drop into both eyes 4 (four) times daily as needed for dry eyes.     potassium chloride 10 MEQ tablet  Commonly known as:  K-DUR,KLOR-CON  Take 5 mEq by mouth every evening.     QUEtiapine 50 MG tablet  Commonly known as:  SEROQUEL  Take 50 mg by mouth at bedtime. Take along with Seroquel 100mg  to = 150mg  at bedtime.     QUEtiapine 100 MG tablet  Commonly known as:  SEROQUEL  Take 100 mg by mouth at bedtime. Give 1 tablet along with Seroquel 50mg  to = 150mg  at bedtime     REFRESH LACRI-LUBE Oint  Place 1 application into both eyes at bedtime.     ARTIFICIAL TEAR OINTMENT OP  Place 1  application into both eyes at bedtime.     risperiDONE 2 MG tablet  Commonly known as:  RISPERDAL  Take 2 mg by mouth 2 (two) times daily.     simvastatin 10 MG tablet  Commonly known as:  ZOCOR  Take 10 mg by mouth daily at 6 PM.     sodium phosphate enema  Commonly known as:  FLEET  Place 1 enema rectally daily as needed (constipation). follow package directions     SYSTANE 0.4-0.3 % Soln  Generic drug:  Polyethyl Glycol-Propyl Glycol  Place 1 drop into both eyes 4 (four) times daily.     tiotropium 18 MCG inhalation capsule  Commonly known as:  SPIRIVA  Place 1 capsule (18 mcg total) into inhaler and inhale daily.     torsemide 20 MG tablet  Commonly known as:  DEMADEX  Take 40 mg by mouth daily with breakfast.     traZODone 150 MG tablet  Commonly known as:  DESYREL  Take 150 mg by mouth at bedtime.     UTI-STAT Liqd  Take 30 mLs by mouth every morning.     venlafaxine XR 150 MG 24 hr capsule  Commonly known as:  EFFEXOR-XR  Take 300 mg by mouth every morning.        No orders of the defined types were placed in this encounter.    Immunization  History  Administered Date(s) Administered  . Influenza Split 10/20/2011  . Influenza Whole 10/05/2007, 09/16/2008, 11/17/2010, 10/01/2013  . Influenza,inj,Quad PF,36+ Mos 09/02/2014  . Influenza-Unspecified 10/01/2014  . PPD Test 02/16/2012  . Pneumococcal Polysaccharide-23 10/21/2003, 11/17/2010, 01/27/2012  . Td 09/20/2003    History  Substance Use Topics  . Smoking status: Former Smoker -- 1.00 packs/day    Types: Cigarettes    Quit date: 11/14/1986  . Smokeless tobacco: Never Used     Comment: pt cannot recall the total amt of time she smoked.  . Alcohol Use: No    Family history is noncontributory    Review of Systems  DATA OBTAINED: from patient, nurse GENERAL:  no fevers,claims  fatigue, appetite changes SKIN: No itching, rash family is concerned about her right great toe as noted above EYES: No  eye pain, redness, discharge EARS: No earache, tinnitus, change in hearing NOSE: No congestion, drainage or bleeding  MOUTH/THROAT: No mouth or tooth pain, No sore throat RESPIRATORY: No cough, wheezing, SOB CARDIAC: No chest pain, palpitations,  Mild  lower extremity edema  GI: No abdominal pain, No N/V/D or constipation, No heartburn or reflux  GU: No dysuria, frequency or urgency, or incontinence  MUSCULOSKELETAL: No unrelieved bone/joint pain--has generalized complaints of weakness which are chronic NEUROLOGIC: No headache, dizziness or focal weakness PSYCHIATRIC: No overt anxiety or sadness, No behavior issue.   Filed Vitals:   07/03/15 1927  BP: 136/74  Pulse: 82  Temp: 97.9 F (36.6 C)  Resp: 16    Physical Exam  GENERAL APPEARANCE: Sleeping but arousable, conversant,  No acute distress.  SKIN: No diaphoresis rash--I did review her right great toe with the wound care nurse-there is some fleshy scaling around the nail bed there is not really any significant tenderness however edema erythema or drainage or sign of infection HEAD: Normocephalic, atraumatic  EYES: Conjunctiva/lids clear. Pupils round, reactive. EOMs intact.  EARS: External exam WNL, canals clear. Hearing grossly normal.  NOSE: No deformity or discharge.  MOUTH/THROAT: Oropharynx is clear mucous membranes moist RESPIRATORY: Breathing is even, unlabored. Lung sounds are clear   CARDIOVASCULAR: Heart RRR no murmurs, rubs or gallops. Trace peripheral edema.   GASTROINTESTINAL: Abdomen is soft, non-tender, not distended w/ normal bowel sounds. GENITOURINARY: Bladder non tender, not distended  MUSCULOSKELETAL: No abnormal joints or musculature does move all extremities 4 does have lower extremity weakness--ambulates in a wheelchair NEUROLOGIC:  Cranial nerves 2-12 grossly intact. Moves all extremities  PSYCHIATRIC: Behaviors appear to have modified somewhat less yelling or agitated behavior it appears  Patient  Active Problem List   Diagnosis Date Noted  . DVT (deep venous thrombosis) 05/30/2015  . 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 Chronic respiratory failure 05/25/2015  . Psychosis 03/04/2015  . Bipolar depression 03/04/2015  . Candidiasis, intertrigo 02/07/2015  . Dysphagia, oropharyngeal 09/28/2014  . Neuropathy due to secondary diabetes 09/28/2014  . Esophageal dysphagia 08/09/2014  . Non compliance with medical treatment 08/09/2014  . MRSA cellulitis 02/20/2014  . Tardive dyskinesia 02/10/2014  . Infection due to ESBL-producing Escherichia coli 02/02/2014  . UTI 12/25/2013  . Diaper candidiasis 12/17/2013  . Dysuria 09/17/2013  . Pure hyperglyceridemia 06/18/2013  . Type 2 diabetes mellitus with neurological manifestations, uncontrolled 05/04/2013  . Chronic pain syndrome 05/04/2013  . Constipation 04/04/2013  . Venous stasis dermatitis 04/03/2012  . COPD exacerbation 02/21/2012  . Tachycardia 12/27/2011  . Thrush 11/10/2011  . Ankle arthritis 07/28/2011  . Chronic right shoulder pain 06/03/2011  . Leg swelling  03/03/2011  . INSOMNIA, CHRONIC 11/17/2010  . Other chronic pain 11/17/2010  . Presence of IVC filter 11/17/2010  . NEOPLASM UNCERTAIN BEHAVIOR MAJOR SALIV GLANDS 11/10/2009  . Personal history of PE (pulmonary embolism) 10/02/2009  . Pulmonary nodules 09/12/2009  . Wheelchair dependence 04/10/2009  . Chronic pulmonary heart disease 05/22/2008  . VENOUS INSUFFICIENCY, CHRONIC 03/25/2008  . SYMPTOM, INCONTINENCE, URINARY NOS 07/13/2007  . Anemia of chronic disease 05/11/2007  . GERD 05/11/2007  . Hyperlipidemia LDL goal <70 02/16/2007  . Obesity, diabetes, and hypertension syndrome 02/16/2007  . BIPOLAR DISORDER 02/16/2007  . Anxiety with obsessional features 02/16/2007  . HYPERTENSION, BENIGN SYSTEMIC 02/16/2007  . COPD (chronic obstructive pulmonary disease) with emphysema 02/16/2007  . OSTEOPENIA 02/16/2007  . TOBACCO ABUSE, HX OF 02/16/2007    CBC    Component  Value Date/Time   WBC 4.9 06/04/2015 1115   WBC 4.6 03/18/2015   RBC 4.52 06/04/2015 1115   HGB 12.4 06/04/2015 1115   HCT 42.8 06/04/2015 1115   PLT 242 06/04/2015 1115   MCV 94.7 06/04/2015 1115   LYMPHSABS 1.9 06/04/2015 1115   MONOABS 0.4 06/04/2015 1115   EOSABS 0.2 06/04/2015 1115   BASOSABS 0.0 06/04/2015 1115    CMP     Component Value Date/Time   NA 140 06/04/2015 1115   NA 140 03/18/2015   K 3.7 06/04/2015 1115   CL 96* 06/04/2015 1115   CO2 35* 06/04/2015 1115   GLUCOSE 124* 06/04/2015 1115   BUN 11 06/04/2015 1115   BUN 13 03/18/2015   CREATININE 0.65 06/04/2015 1115   CREATININE 0.73 03/24/2011 1614   CALCIUM 9.4 06/04/2015 1115   PROT 8.2* 06/04/2015 1115   ALBUMIN 4.3 06/04/2015 1115   AST 40 06/04/2015 1115   ALT 31 06/04/2015 1115   ALKPHOS 54 06/04/2015 1115   BILITOT 0.1* 06/04/2015 1115   GFRNONAA >60 06/04/2015 1115   GFRAA >60 06/04/2015 1115    Assessment and Plan #1 tachycardia this appears to have improved to with increase of Cardizem recent pulses have been in the 70s and 80s.  #2-hypertension this appears stable on metoprolol Cardizem Demadex recent blood pressures 136/74-106/56-in this range.  Hyperlipidemia this appears to be stable lipid panel in February showed cholesterol 162 LDL 71 is on Zocor will update liver function tests-.  #4-history DVT-she is on L Quist this is been stable.  #5-history of bipolar disorder this appears to have stabilized she is followed closely by psychiatric services is on numerous medications including risperidone-lameictal and Seroquel--she also is on Xanax 3 times a day routinely   #6-history of COPD she has been seen by pulmonology per review of note and thought to be doing stable she is on Advair as well as Xopenex. And Spiriva  #7 history diabetes type 2 she is on Lantus twice a day as well as NovoLog before meals-hemoglobin  was 8 back in April we will recheck this per nursing staff blood sugars are  stable although somewhat elevated in the 200s at times-actual review [facility] records is unavailable via computer at this time we will try to c  recheck this when it becomes available-according in nursing there are no hypoglycemic concerns   #8-history of neuropathy I suspect diabetic related she is on Neurontin this appears to be relatively stable.  #9-pain management she is on OxyContin 10 mg twice a day as well as Norco for breakthrough pain apparently this is fairly effective.  #10-history of right great toe issues-at this point monitor  I did see this with wound care nurse I do not see evidence of infection but will have to be monitored  Again will need to update a hemoglobin A1c CBC and CMP-.  I also note the urine culture is pending have written  order to obtain those results somewhat unclear why was obtained   [id number]-of note greater than 45 minutes spent assessing patient-discussing her status with nursing staff-reviewing her chart-and coordinating and formulating a plan of care for numerous diagnoses-of note greater than 50% of time spent coordinating plan of care  #11-      Shabnam Ladd C,

## 2015-07-07 IMAGING — CR DG CHEST 1V PORT
1 series · 1 of 1 positions shown · non-contrast
Comparison: 05/25/2015

CLINICAL DATA: Weakness and shortness of breath today. History of
COPD. Former smoker.

EXAM:
PORTABLE CHEST - 1 VIEW

[AP]
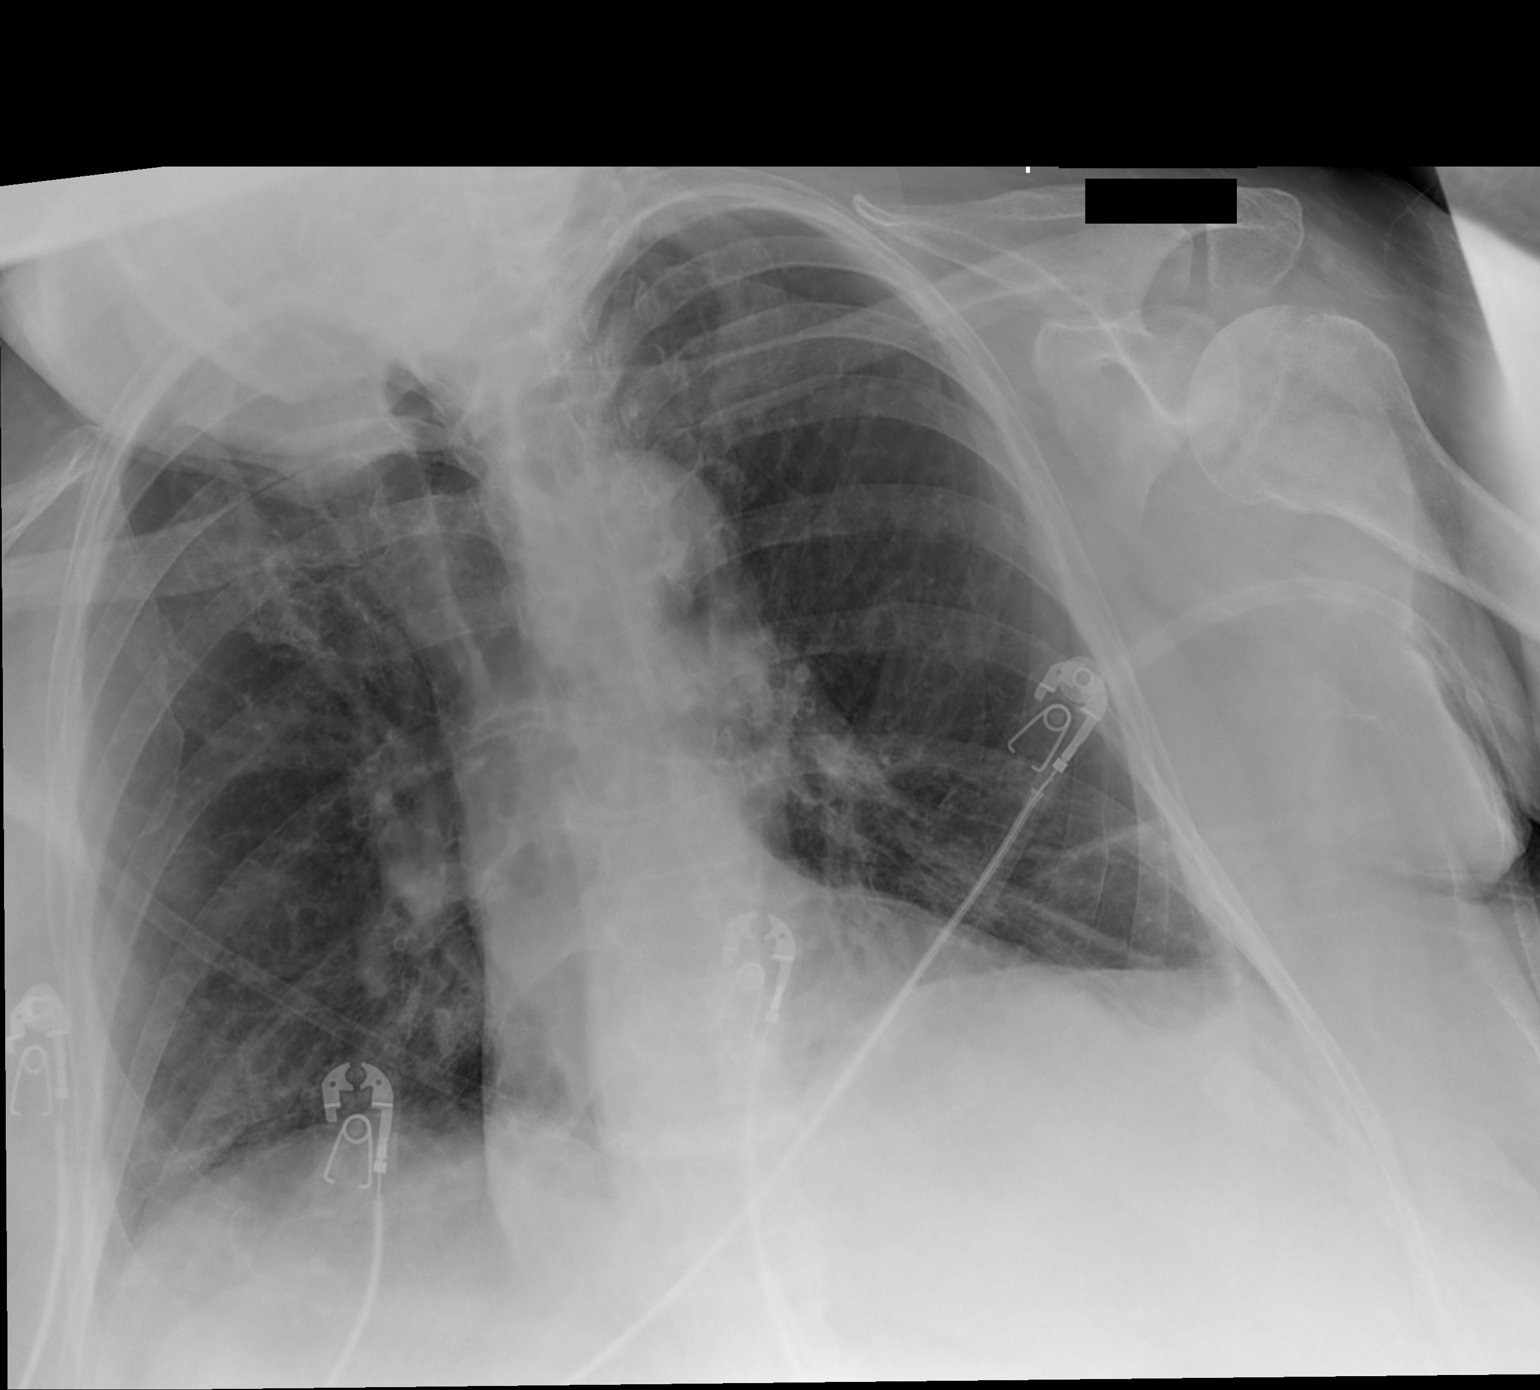

[1 of 1 positions shown; findings below may reference images not displayed]

FINDINGS: The patient is rotated to the right which partially limits
evaluation of the mediastinum. Cardiomediastinal silhouette is
grossly unchanged. The lungs are hypoinflated with mild linear
opacities in the lung bases most consistent with atelectasis. There
is mild central pulmonary vascular congestion. Diffuse interstitial
densities on the prior study have largely resolved. There is no
evidence of overt pulmonary edema, segmental airspace consolidation,
definite pleural effusion, or pneumothorax. Patient's chin obscures
the right lung apex. No acute osseous abnormality is identified.
IMPRESSION: Bibasilar subsegmental atelectasis.

## 2015-07-15 ENCOUNTER — Non-Acute Institutional Stay (SKILLED_NURSING_FACILITY): Payer: Medicare Other | Admitting: Internal Medicine

## 2015-07-15 ENCOUNTER — Encounter: Payer: Self-pay | Admitting: Internal Medicine

## 2015-07-15 DIAGNOSIS — D638 Anemia in other chronic diseases classified elsewhere: Secondary | ICD-10-CM

## 2015-07-15 DIAGNOSIS — E669 Obesity, unspecified: Secondary | ICD-10-CM | POA: Diagnosis not present

## 2015-07-15 DIAGNOSIS — E1159 Type 2 diabetes mellitus with other circulatory complications: Secondary | ICD-10-CM

## 2015-07-15 DIAGNOSIS — E119 Type 2 diabetes mellitus without complications: Secondary | ICD-10-CM

## 2015-07-15 DIAGNOSIS — E1169 Type 2 diabetes mellitus with other specified complication: Secondary | ICD-10-CM

## 2015-07-15 DIAGNOSIS — I10 Essential (primary) hypertension: Secondary | ICD-10-CM | POA: Diagnosis not present

## 2015-07-15 NOTE — Progress Notes (Signed)
MRN: [id number] Name: [name]  Sex: female Age: 74 y.o. DOB: Oct 30, 1941  [facility]: [facility] Facility/Room:217W Level Of Care: SNF Provider: Inocencio Homes D Emergency Contacts: Extended Emergency Contact Information Primary Emergency Contact: Wright,[name] Address: [place], [place] [address] [place] Work Phone: [phone] Mobile Phone: [phone] Relation: Daughter Secondary Emergency Contact: Rachelle Hora [place] Phone: [phone] Relation: Sister  Code Status: FULL  Allergies: Benzodiazepines; Morphine and related; Olanzapine; and Sulfamethoxazole-trimethoprim  Chief Complaint  Patient presents with  . Medical Management of Chronic Issues    HPI: Patient is 74 y.o. female who is being seen for anemia, obesity/HTN/DM syndrome and for HTN.  Past Medical History  Diagnosis Date  . Anxiety   . Arthritis   . COPD (chronic obstructive pulmonary disease)   . GERD (gastroesophageal reflux disease)   . Hyperlipidemia   . Osteoporosis   . DVT (deep venous thrombosis)   . PE (pulmonary thromboembolism)   . Hypertension      PT DENIES....ON NO  MEDS  . Diabetes mellitus   . Cancer 1969    cervical  . Pneumonia     h/o  . Bronchitis     h/o  . Blood transfusion   . Anemia   . Bipolar affect, depressed   . Acute thromboembolism of deep veins of lower extremity 10/02/2009    Qualifier: Diagnosis of  By: [place], Bonnie    . Septic shock 12/25/2013  . 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 Vena cava filter placement    . 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 Eye surgery      CATARACTS  . Achilles tendon lengthening  11/18/11    and repair w/posterior tibial tendon lengthening; right  foot  . Cholecystectomy    . Abdominal hysterectomy  1969    "womb taken out for cervical cancer"  . Cataract extraction, bilateral  ~ 2008  . Tonsillectomy       "as a child"  . Achilles tendon surgery  11/18/2011    Procedure: ACHILLES TENDON REPAIR;  Surgeon: Wylene Simmer, MD;  Location: [facility];  Service: Orthopedics;  Laterality: Right;  Right Posterior Tibial Tendon Lenghtening and Tendon Achilles Lenghtening       Medication List       This list is accurate as of: 07/15/15  9:09 PM.  Always use your most recent med list.               acetaminophen 325 MG tablet  Commonly known as:  TYLENOL  Take 650 mg by mouth every 4 (four) hours as needed for mild pain.     ALPRAZolam 1 MG tablet  Commonly known as:  XANAX  Take 1 tablet (1 mg total) by mouth 4 (four) times daily. Take at 0600, 1000, 1400, and 1800.     alum & mag hydroxide-simeth 200-200-20 MG/5ML suspension  Commonly known as:  MAALOX/MYLANTA  Take 30 mLs by mouth every 4 (four) hours as needed for indigestion.     antiseptic oral rinse Liqd  15 mLs by Mouth Rinse route every 4 (four) hours as needed for dry mouth.     apixaban 5 MG Tabs tablet  Commonly known as:  ELIQUIS  Take 2 tablets (10 mg total) by mouth 2 (two) times daily.  aspirin 81 MG chewable tablet  Chew 81 mg by mouth daily with breakfast.     bisacodyl 10 MG suppository  Commonly known as:  DULCOLAX  Place 10 mg rectally as directed. insert three times a week- Monday, Wednesday and Friday, and if no bowel movement place 1 every 2 days     CHLORASEPTIC 6-10 MG lozenge  Generic drug:  benzocaine-menthol  Take 1 lozenge by mouth every 2 (two) hours as needed for sore throat.     diltiazem 120 MG 24 hr capsule  Commonly known as:  CARDIZEM CD  Take 1 capsule (120 mg total) by mouth daily.     esomeprazole 40 MG capsule  Commonly known as:  NEXIUM  Take 40 mg by mouth daily before breakfast.     fenofibrate 145 MG tablet  Commonly known as:  TRICOR  Take 145 mg by mouth daily.     Fish Oil 1000 MG Caps  Take 1 capsule by mouth 2 (two) times daily.     fish oil-omega-3 fatty acids 1000 MG  capsule  Take 1 g by mouth 2 (two) times daily.     Fluticasone-Salmeterol 250-50 MCG/DOSE Aepb  Commonly known as:  ADVAIR DISKUS  Inhale 1 puff into the lungs 2 (two) times daily.     gabapentin 300 MG capsule  Commonly known as:  NEURONTIN  Take 3 capsules (900 mg total) by mouth 3 (three) times daily.     GLUCERNA Liqd  Take 1 Can by mouth 2 (two) times daily between meals.     imipenem-cilastatin 500 mg in sodium chloride 0.9 % 100 mL  Inject 500 mg into the vein every 8 (eight) hours.     insulin aspart 100 UNIT/ML FlexPen  Commonly known as:  NOVOLOG  Inject 3-5 Units into the skin 3 (three) times daily before meals. Give 3 units before meals with an additional 5 units for CBG> or < 150     insulin glargine 100 UNIT/ML injection  Commonly known as:  LANTUS  Inject 20-22 Units into the skin 2 (two) times daily. Takes 20 units in the morning and 22 units at bedtime     lamoTRIgine 150 MG tablet  Commonly known as:  LAMICTAL  Take 150 mg by mouth every morning.     levalbuterol 1.25 MG/0.5ML nebulizer solution  Commonly known as:  XOPENEX  Take 1.25 mg by nebulization 4 (four) times daily as needed for wheezing or shortness of breath.     levalbuterol 45 MCG/ACT inhaler  Commonly known as:  XOPENEX HFA  Inhale 2 puffs into the lungs every 4 (four) hours as needed for wheezing.     loperamide 2 MG capsule  Commonly known as:  IMODIUM  Take 2 mg by mouth as needed for diarrhea or loose stools (do not exceed 8mg  /24 hours).     loratadine 10 MG tablet  Commonly known as:  CLARITIN  Take 10 mg by mouth every morning.     magnesium hydroxide 400 MG/5ML suspension  Commonly known as:  MILK OF MAGNESIA  Take 30 mLs by mouth daily as needed for mild constipation.     metoprolol 50 MG tablet  Commonly known as:  LOPRESSOR  Take 1 tablet (50 mg total) by mouth 2 (two) times daily. At 0800 and 1700.     OxyCODONE 10 mg T12a 12 hr tablet  Commonly known as:  OXYCONTIN   Take one tablet by mouth every 12 hours PRN pain. Do  not crush     OXYGEN  Inhale 2 L into the lungs continuous. Via nasal cannula.     polyethylene glycol packet  Commonly known as:  MIRALAX / GLYCOLAX  Take 17 g by mouth daily.     polyvinyl alcohol 1.4 % ophthalmic solution  Commonly known as:  LIQUIFILM TEARS  Place 2 drops into both eyes 4 (four) times daily as needed for dry eyes.     ARTIFICIAL TEARS 1.4 % ophthalmic solution  Generic drug:  polyvinyl alcohol  Place 1 drop into both eyes 4 (four) times daily as needed for dry eyes.     potassium chloride 10 MEQ tablet  Commonly known as:  K-DUR,KLOR-CON  Take 5 mEq by mouth every evening.     QUEtiapine 50 MG tablet  Commonly known as:  SEROQUEL  Take 50 mg by mouth at bedtime. Take along with Seroquel 100mg  to = 150mg  at bedtime.     QUEtiapine 100 MG tablet  Commonly known as:  SEROQUEL  Take 100 mg by mouth at bedtime. Give 1 tablet along with Seroquel 50mg  to = 150mg  at bedtime     REFRESH LACRI-LUBE Oint  Place 1 application into both eyes at bedtime.     ARTIFICIAL TEAR OINTMENT OP  Place 1 application into both eyes at bedtime.     risperiDONE 2 MG tablet  Commonly known as:  RISPERDAL  Take 2 mg by mouth 2 (two) times daily.     simvastatin 10 MG tablet  Commonly known as:  ZOCOR  Take 10 mg by mouth daily at 6 PM.     sodium phosphate enema  Commonly known as:  FLEET  Place 1 enema rectally daily as needed (constipation). follow package directions     SYSTANE 0.4-0.3 % Soln  Generic drug:  Polyethyl Glycol-Propyl Glycol  Place 1 drop into both eyes 4 (four) times daily.     tiotropium 18 MCG inhalation capsule  Commonly known as:  SPIRIVA  Place 1 capsule (18 mcg total) into inhaler and inhale daily.     torsemide 20 MG tablet  Commonly known as:  DEMADEX  Take 40 mg by mouth daily with breakfast.     traZODone 150 MG tablet  Commonly known as:  DESYREL  Take 150 mg by mouth at bedtime.      UTI-STAT Liqd  Take 30 mLs by mouth every morning.     venlafaxine XR 150 MG 24 hr capsule  Commonly known as:  EFFEXOR-XR  Take 300 mg by mouth every morning.        No orders of the defined types were placed in this encounter.    Immunization History  Administered Date(s) Administered  . Influenza Split 10/20/2011  . Influenza Whole 10/05/2007, 09/16/2008, 11/17/2010, 10/01/2013  . Influenza,inj,Quad PF,36+ Mos 09/02/2014  . Influenza-Unspecified 10/01/2014  . PPD Test 02/16/2012  . Pneumococcal Polysaccharide-23 10/21/2003, 11/17/2010, 01/27/2012  . Td 09/20/2003    History  Substance Use Topics  . Smoking status: Former Smoker -- 1.00 packs/day    Types: Cigarettes    Quit date: 11/14/1986  . Smokeless tobacco: Never Used     Comment: pt cannot recall the total amt of time she smoked.  . Alcohol Use: No    Review of Systems  DATA OBTAINED: from patient, nurse GENERAL:  no fevers, fatigue, appetite changes SKIN: No itching, rash HEENT: No complaint RESPIRATORY: No cough, wheezing, SOB CARDIAC: No chest pain, palpitations, lower extremity edema  GI: No  abdominal pain, No N/V/D or constipation, No heartburn or reflux  GU: No dysuria, frequency or urgency, or incontinence  MUSCULOSKELETAL: No unrelieved bone/joint pain NEUROLOGIC: No headache, dizziness  PSYCHIATRIC: No overt anxiety or sadness  Filed Vitals:   07/15/15 1655  BP: 131/72  Pulse: 71  Temp: 97.9 F (36.6 C)  Resp: 18    Physical Exam  GENERAL APPEARANCE: Alert, conversant, No acute distress, obese WF lying in bed for a nap  SKIN: No diaphoresis rash, or wounds HEENT: Unremarkable RESPIRATORY: Breathing is even, unlabored. Lung sounds are clear   CARDIOVASCULAR: Heart RRR no murmurs, rubs or gallops. trace peripheral edema  GASTROINTESTINAL: Abdomen is soft, non-tender, not distended w/ normal bowel sounds.  GENITOURINARY: Bladder non tender, not distended  MUSCULOSKELETAL: No  abnormal joints or musculature NEUROLOGIC: Cranial nerves 2-12 grossly intact PSYCHIATRIC: slightly odd, no behavioral issues today  Patient Active Problem List   Diagnosis Date Noted  . DVT (deep venous thrombosis) 05/30/2015  . 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 Chronic respiratory failure 05/25/2015  . Psychosis 03/04/2015  . Bipolar depression 03/04/2015  . Candidiasis, intertrigo 02/07/2015  . Dysphagia, oropharyngeal 09/28/2014  . Neuropathy due to secondary diabetes 09/28/2014  . Esophageal dysphagia 08/09/2014  . Non compliance with medical treatment 08/09/2014  . MRSA cellulitis 02/20/2014  . Tardive dyskinesia 02/10/2014  . Infection due to ESBL-producing Escherichia coli 02/02/2014  . UTI 12/25/2013  . Diaper candidiasis 12/17/2013  . Dysuria 09/17/2013  . Pure hyperglyceridemia 06/18/2013  . Type 2 diabetes mellitus with neurological manifestations, uncontrolled 05/04/2013  . Chronic pain syndrome 05/04/2013  . Constipation 04/04/2013  . Venous stasis dermatitis 04/03/2012  . COPD exacerbation 02/21/2012  . Tachycardia 12/27/2011  . Thrush 11/10/2011  . Ankle arthritis 07/28/2011  . Chronic right shoulder pain 06/03/2011  . Leg swelling 03/03/2011  . INSOMNIA, CHRONIC 11/17/2010  . Other chronic pain 11/17/2010  . Presence of IVC filter 11/17/2010  . NEOPLASM UNCERTAIN BEHAVIOR MAJOR SALIV GLANDS 11/10/2009  . Personal history of PE (pulmonary embolism) 10/02/2009  . Pulmonary nodules 09/12/2009  . Wheelchair dependence 04/10/2009  . Chronic pulmonary heart disease 05/22/2008  . VENOUS INSUFFICIENCY, CHRONIC 03/25/2008  . SYMPTOM, INCONTINENCE, URINARY NOS 07/13/2007  . Anemia of chronic disease 05/11/2007  . GERD 05/11/2007  . Hyperlipidemia LDL goal <70 02/16/2007  . Obesity, diabetes, and hypertension syndrome 02/16/2007  . BIPOLAR DISORDER 02/16/2007  . Anxiety with obsessional features 02/16/2007  . HYPERTENSION, BENIGN SYSTEMIC 02/16/2007  . COPD (chronic  obstructive pulmonary disease) with emphysema 02/16/2007  . OSTEOPENIA 02/16/2007  . TOBACCO ABUSE, HX OF 02/16/2007    CBC    Component Value Date/Time   WBC 4.9 06/04/2015 1115   WBC 4.6 03/18/2015   RBC 4.52 06/04/2015 1115   HGB 12.4 06/04/2015 1115   HCT 42.8 06/04/2015 1115   PLT 242 06/04/2015 1115   MCV 94.7 06/04/2015 1115   LYMPHSABS 1.9 06/04/2015 1115   MONOABS 0.4 06/04/2015 1115   EOSABS 0.2 06/04/2015 1115   BASOSABS 0.0 06/04/2015 1115    CMP     Component Value Date/Time   NA 140 06/04/2015 1115   NA 140 03/18/2015   K 3.7 06/04/2015 1115   CL 96* 06/04/2015 1115   CO2 35* 06/04/2015 1115   GLUCOSE 124* 06/04/2015 1115   BUN 11 06/04/2015 1115   BUN 13 03/18/2015   CREATININE 0.65 06/04/2015 1115   CREATININE 0.73 03/24/2011 1614   CALCIUM 9.4 06/04/2015 1115   PROT 8.2* 06/04/2015 1115  ALBUMIN 4.3 06/04/2015 1115   AST 40 06/04/2015 1115   ALT 31 06/04/2015 1115   ALKPHOS 54 06/04/2015 1115   BILITOT 0.1* 06/04/2015 1115   GFRNONAA >60 06/04/2015 1115   GFRAA >60 06/04/2015 1115    Assessment and Plan  Anemia of chronic disease 06/2015 Hb 11.9/HCT 38.2; improved ;Plan - continue to monitor  Obesity, diabetes, and hypertension syndrome BP is controlled and last A1c was 7.3 which is excellent for pt; pt will not lose weight, she snacks and we can't stop her; Plan - cont monitor, BP, A1c and weight  HYPERTENSION, BENIGN SYSTEMIC BP is controlled on current regimen; Plan - cont demedex 40 mg daily,diltiazem CD 240 mg daily and metoprolol 50 mg BID    Hennie Duos, MD

## 2015-07-15 NOTE — Assessment & Plan Note (Signed)
BP is controlled and last A1c was 7.3 which is excellent for pt; pt will not lose weight, she snacks and we can't stop her; Plan - cont monitor, BP, A1c and weight

## 2015-07-15 NOTE — Assessment & Plan Note (Signed)
06/2015 Hb 11.9/HCT 38.2; improved ;Plan - continue to monitor

## 2015-07-15 NOTE — Assessment & Plan Note (Signed)
BP is controlled on current regimen; Plan - cont demedex 40 mg daily,diltiazem CD 240 mg daily and metoprolol 50 mg BID

## 2015-07-17 ENCOUNTER — Encounter: Payer: Self-pay | Admitting: Internal Medicine

## 2015-07-17 ENCOUNTER — Non-Acute Institutional Stay (SKILLED_NURSING_FACILITY): Payer: Medicare Other | Admitting: Internal Medicine

## 2015-07-17 DIAGNOSIS — S82892A Other fracture of left lower leg, initial encounter for closed fracture: Secondary | ICD-10-CM

## 2015-07-17 NOTE — Progress Notes (Signed)
Patient ID: [name], female   DOB: [birth year], 74 y.o.   MRN: [id number] MRN: [id number] Name: [name]  Sex: female Age: 74 y.o. DOB: 19-Feb-1941  [facility] Lee #: [facility] Facility/Room:217W Level Of Care: SNF Provider: Wille [name] Emergency Contacts: Extended Emergency Contact Information Primary Emergency Contact: Wright,[name] Address: [address] [place], [place] [address] [place] Work Phone: [phone] Mobile Phone: [phone] Relation: Daughter Secondary Emergency Contact: Rachelle Hora [place] Phone: [phone] Relation: Sister  Code Status: FULL  Allergies: Benzodiazepines; Morphine and related; Olanzapine; and Sulfamethoxazole-trimethoprim  Chief Complaint  Patient presents with  . Acute Visit    HPI: Patient is 74 y.o. female--seeing today for on site x-ray that notes suspicion for a left posterior malleolus fracture-nondisplaced--- per nursing staff apparently patient was being transferred from bed to wheelchair and complained of some ankle pain-to my knowledge there was no fall or trauma.  X-ray was ordered and came back again showing possible nondisplaced left posterior malleolus fracture no subluxation.  Currently patient is resting in bed comfortably she has been made nonweightbearing she appears to be at her baseline  Past Medical History  Diagnosis Date  . Anxiety   . Arthritis   . COPD (chronic obstructive pulmonary disease)   . GERD (gastroesophageal reflux disease)   . Hyperlipidemia   . Osteoporosis   . DVT (deep venous thrombosis)   . PE (pulmonary thromboembolism)   . Hypertension      PT DENIES....ON NO  MEDS  . Diabetes mellitus   . Cancer 1969    cervical  . Pneumonia     h/o  . Bronchitis     h/o  . Blood transfusion   . Anemia   . Bipolar affect, depressed   . Acute thromboembolism of deep veins of lower extremity 10/02/2009    Qualifier: Diagnosis of  By: [place],  Bonnie    . Septic shock 12/25/2013  . 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 Vena cava filter placement    . 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 Eye surgery      CATARACTS  . Achilles tendon lengthening  11/18/11    and repair w/posterior tibial tendon lengthening; right  foot  . Cholecystectomy    . Abdominal hysterectomy  1969    "womb taken out for cervical cancer"  . Cataract extraction, bilateral  ~ 2008  . Tonsillectomy      "as a child"  . Achilles tendon surgery  11/18/2011    Procedure: ACHILLES TENDON REPAIR;  Surgeon: Wylene Simmer, MD;  Location: [facility];  Service: Orthopedics;  Laterality: Right;  Right Posterior Tibial Tendon Lenghtening and Tendon Achilles Lenghtening       Medication List       This list is accurate as of: 07/17/15  1:20 PM.  Always use your most recent med list.               acetaminophen 325 MG tablet  Commonly known as:  TYLENOL  Take 650 mg by mouth every 4 (four) hours as needed for mild pain.     ALPRAZolam 1 MG tablet  Commonly known as:  XANAX  Take 1 tablet (1 mg total) by mouth 4 (four) times daily. Take at 0600, 1000, 1400, and 1800.     alum & mag  hydroxide-simeth 200-200-20 MG/5ML suspension  Commonly known as:  MAALOX/MYLANTA  Take 30 mLs by mouth every 4 (four) hours as needed for indigestion.     antiseptic oral rinse Liqd  15 mLs by Mouth Rinse route every 4 (four) hours as needed for dry mouth.     apixaban 5 MG Tabs tablet  Commonly known as:  ELIQUIS  Take 2 tablets (10 mg total) by mouth 2 (two) times daily.     aspirin 81 MG chewable tablet  Chew 81 mg by mouth daily with breakfast.     bisacodyl 10 MG suppository  Commonly known as:  DULCOLAX  Place 10 mg rectally as directed. insert three times a week- Monday, Wednesday and Friday, and if no bowel movement place 1 every 2 days     CHLORASEPTIC 6-10 MG lozenge  Generic drug:  benzocaine-menthol  Take 1 lozenge  by mouth every 2 (two) hours as needed for sore throat.     diltiazem 120 MG 24 hr capsule  Commonly known as:  CARDIZEM CD  Take 1 capsule (120 mg total) by mouth daily.     esomeprazole 40 MG capsule  Commonly known as:  NEXIUM  Take 40 mg by mouth daily before breakfast.     fenofibrate 145 MG tablet  Commonly known as:  TRICOR  Take 145 mg by mouth daily.     Fish Oil 1000 MG Caps  Take 1 capsule by mouth 2 (two) times daily.     fish oil-omega-3 fatty acids 1000 MG capsule  Take 1 g by mouth 2 (two) times daily.     Fluticasone-Salmeterol 250-50 MCG/DOSE Aepb  Commonly known as:  ADVAIR DISKUS  Inhale 1 puff into the lungs 2 (two) times daily.     gabapentin 300 MG capsule  Commonly known as:  NEURONTIN  Take 3 capsules (900 mg total) by mouth 3 (three) times daily.     GLUCERNA Liqd  Take 1 Can by mouth 2 (two) times daily between meals.     imipenem-cilastatin 500 mg in sodium chloride 0.9 % 100 mL  Inject 500 mg into the vein every 8 (eight) hours.     insulin aspart 100 UNIT/ML FlexPen  Commonly known as:  NOVOLOG  Inject 3-5 Units into the skin 3 (three) times daily before meals. Give 3 units before meals with an additional 5 units for CBG> or < 150     insulin glargine 100 UNIT/ML injection  Commonly known as:  LANTUS  Inject 20-22 Units into the skin 2 (two) times daily. Takes 20 units in the morning and 22 units at bedtime     lamoTRIgine 150 MG tablet  Commonly known as:  LAMICTAL  Take 150 mg by mouth every morning.     levalbuterol 1.25 MG/0.5ML nebulizer solution  Commonly known as:  XOPENEX  Take 1.25 mg by nebulization 4 (four) times daily as needed for wheezing or shortness of breath.     levalbuterol 45 MCG/ACT inhaler  Commonly known as:  XOPENEX HFA  Inhale 2 puffs into the lungs every 4 (four) hours as needed for wheezing.     loperamide 2 MG capsule  Commonly known as:  IMODIUM  Take 2 mg by mouth as needed for diarrhea or loose stools  (do not exceed 8mg  /24 hours).     loratadine 10 MG tablet  Commonly known as:  CLARITIN  Take 10 mg by mouth every morning.     magnesium hydroxide 400 MG/5ML suspension  Commonly known as:  MILK OF MAGNESIA  Take 30 mLs by mouth daily as needed for mild constipation.     metoprolol 50 MG tablet  Commonly known as:  LOPRESSOR  Take 1 tablet (50 mg total) by mouth 2 (two) times daily. At 0800 and 1700.     OxyCODONE 10 mg T12a 12 hr tablet  Commonly known as:  OXYCONTIN  Take one tablet by mouth every 12 hours PRN pain. Do not crush     OXYGEN  Inhale 2 L into the lungs continuous. Via nasal cannula.     polyethylene glycol packet  Commonly known as:  MIRALAX / GLYCOLAX  Take 17 g by mouth daily.     polyvinyl alcohol 1.4 % ophthalmic solution  Commonly known as:  LIQUIFILM TEARS  Place 2 drops into both eyes 4 (four) times daily as needed for dry eyes.     ARTIFICIAL TEARS 1.4 % ophthalmic solution  Generic drug:  polyvinyl alcohol  Place 1 drop into both eyes 4 (four) times daily as needed for dry eyes.     potassium chloride 10 MEQ tablet  Commonly known as:  K-DUR,KLOR-CON  Take 5 mEq by mouth every evening.     QUEtiapine 50 MG tablet  Commonly known as:  SEROQUEL  Take 50 mg by mouth at bedtime. Take along with Seroquel 100mg  to = 150mg  at bedtime.     QUEtiapine 100 MG tablet  Commonly known as:  SEROQUEL  Take 100 mg by mouth at bedtime. Give 1 tablet along with Seroquel 50mg  to = 150mg  at bedtime     REFRESH LACRI-LUBE Oint  Place 1 application into both eyes at bedtime.     ARTIFICIAL TEAR OINTMENT OP  Place 1 application into both eyes at bedtime.     risperiDONE 2 MG tablet  Commonly known as:  RISPERDAL  Take 2 mg by mouth 2 (two) times daily.     simvastatin 10 MG tablet  Commonly known as:  ZOCOR  Take 10 mg by mouth daily at 6 PM.     sodium phosphate enema  Commonly known as:  FLEET  Place 1 enema rectally daily as needed (constipation).  follow package directions     SYSTANE 0.4-0.3 % Soln  Generic drug:  Polyethyl Glycol-Propyl Glycol  Place 1 drop into both eyes 4 (four) times daily.     tiotropium 18 MCG inhalation capsule  Commonly known as:  SPIRIVA  Place 1 capsule (18 mcg total) into inhaler and inhale daily.     torsemide 20 MG tablet  Commonly known as:  DEMADEX  Take 40 mg by mouth daily with breakfast.     traZODone 150 MG tablet  Commonly known as:  DESYREL  Take 150 mg by mouth at bedtime.     UTI-STAT Liqd  Take 30 mLs by mouth every morning.     venlafaxine XR 150 MG 24 hr capsule  Commonly known as:  EFFEXOR-XR  Take 300 mg by mouth every morning.        No orders of the defined types were placed in this encounter.    Immunization History  Administered Date(s) Administered  . Influenza Split 10/20/2011  . Influenza Whole 10/05/2007, 09/16/2008, 11/17/2010, 10/01/2013  . Influenza,inj,Quad PF,36+ Mos 09/02/2014  . Influenza-Unspecified 10/01/2014  . PPD Test 02/16/2012  . Pneumococcal Polysaccharide-23 10/21/2003, 11/17/2010, 01/27/2012  . Td 09/20/2003    History  Substance Use Topics  . Smoking status: Former Smoker -- 1.00 packs/day  Types: Cigarettes    Quit date: 11/14/1986  . Smokeless tobacco: Never Used     Comment: pt cannot recall the total amt of time she smoked.  . Alcohol Use: No    Review of Systems  DATA OBTAINED: from patient, nurse GENERAL:  no fevers, fatigue, appetite changes SKIN: No itching, rash HEENT: No complaint RESPIRATORY: No cough, wheezing, SOB--has history COPD CARDIAC: No chest pain, palpitations, lower extremity edema  GI: No abdominal pain, No N/V/D or constipation, No heartburn or reflux  GU: No dysuria, frequency or urgency, or incontinence  MUSCULOSKELETAL: No unrelieved bone/joint pain--ankle at this point does not appear to be acutely painful again suspicions for a left posterior malleolus fracture NEUROLOGIC: No headache, dizziness   PSYCHIATRIC: No overt anxiety or sadness  Filed Vitals:   07/17/15 1315  BP: 129/77  Pulse: 66  Temp: 97.9 F (36.6 C)  Resp: 16    Physical Exam  GENERAL APPEARANCE: Alert, conversant, No acute distress, obese WF lying in bed for a nap  SKIN: No diaphoresis rash, or wounds HEENT: Mucous membranes appear moist RESPIRATORY: Breathing is even, unlabored. Lung sounds are clear--with some what poor respiratory effort   CARDIOVASCULAR: Heart RRR no murmurs, rubs or gallops. trace peripheral edema  GASTROINTESTINAL: Abdomen is soft, non-tender, not distended w/ normal bowel sounds.  MUSCULOSKELETAL: No abnormal joints or musculature--posterior ankle does not appear to be erythematous--no edema-or acutely tender to palpation--I do not note any deformity NEUROLOGIC: Cranial nerves 2-12 grossly intact PSYCHIATRIC: slightly odd, no behavioral issues today  Patient Active Problem List   Diagnosis Date Noted  . Ankle fracture, left 07/17/2015  . DVT (deep venous thrombosis) 05/30/2015  . 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 Chronic respiratory failure 05/25/2015  . Psychosis 03/04/2015  . Bipolar depression 03/04/2015  . Candidiasis, intertrigo 02/07/2015  . Dysphagia, oropharyngeal 09/28/2014  . Neuropathy due to secondary diabetes 09/28/2014  . Esophageal dysphagia 08/09/2014  . Non compliance with medical treatment 08/09/2014  . MRSA cellulitis 02/20/2014  . Tardive dyskinesia 02/10/2014  . Infection due to ESBL-producing Escherichia coli 02/02/2014  . UTI 12/25/2013  . Diaper candidiasis 12/17/2013  . Dysuria 09/17/2013  . Pure hyperglyceridemia 06/18/2013  . Type 2 diabetes mellitus with neurological manifestations, uncontrolled 05/04/2013  . Chronic pain syndrome 05/04/2013  . Constipation 04/04/2013  . Venous stasis dermatitis 04/03/2012  . COPD exacerbation 02/21/2012  . Tachycardia 12/27/2011  . Thrush 11/10/2011  . Ankle arthritis 07/28/2011  . Chronic right shoulder pain  06/03/2011  . Leg swelling 03/03/2011  . INSOMNIA, CHRONIC 11/17/2010  . Other chronic pain 11/17/2010  . Presence of IVC filter 11/17/2010  . NEOPLASM UNCERTAIN BEHAVIOR MAJOR SALIV GLANDS 11/10/2009  . Personal history of PE (pulmonary embolism) 10/02/2009  . Pulmonary nodules 09/12/2009  . Wheelchair dependence 04/10/2009  . Chronic pulmonary heart disease 05/22/2008  . VENOUS INSUFFICIENCY, CHRONIC 03/25/2008  . SYMPTOM, INCONTINENCE, URINARY NOS 07/13/2007  . Anemia of chronic disease 05/11/2007  . GERD 05/11/2007  . Hyperlipidemia LDL goal <70 02/16/2007  . Obesity, diabetes, and hypertension syndrome 02/16/2007  . BIPOLAR DISORDER 02/16/2007  . Anxiety with obsessional features 02/16/2007  . HYPERTENSION, BENIGN SYSTEMIC 02/16/2007  . COPD (chronic obstructive pulmonary disease) with emphysema 02/16/2007  . OSTEOPENIA 02/16/2007  . TOBACCO ABUSE, HX OF 02/16/2007    Labs.  07/07/2015.  WBC 6.1 hemoglobin 11.9 platelets 247.  Albumen 3.8 liver function tests within normal limits  Creatinine 0.6 BUN 12 CO2 38 ALT 25 AST 27 alkaline phosphatase 39.  CBC    Component Value Date/Time   WBC 4.9 06/04/2015 1115   WBC 4.6 03/18/2015   RBC 4.52 06/04/2015 1115   HGB 12.4 06/04/2015 1115   HCT 42.8 06/04/2015 1115   PLT 242 06/04/2015 1115   MCV 94.7 06/04/2015 1115   LYMPHSABS 1.9 06/04/2015 1115   MONOABS 0.4 06/04/2015 1115   EOSABS 0.2 06/04/2015 1115   BASOSABS 0.0 06/04/2015 1115    CMP     Component Value Date/Time   NA 140 06/04/2015 1115   NA 140 03/18/2015   K 3.7 06/04/2015 1115   CL 96* 06/04/2015 1115   CO2 35* 06/04/2015 1115   GLUCOSE 124* 06/04/2015 1115   BUN 11 06/04/2015 1115   BUN 13 03/18/2015   CREATININE 0.65 06/04/2015 1115   CREATININE 0.73 03/24/2011 1614   CALCIUM 9.4 06/04/2015 1115   PROT 8.2* 06/04/2015 1115   ALBUMIN 4.3 06/04/2015 1115   AST 40 06/04/2015 1115   ALT 31 06/04/2015 1115   ALKPHOS 54 06/04/2015 1115    BILITOT 0.1* 06/04/2015 1115   GFRNONAA >60 06/04/2015 1115   GFRAA >60 06/04/2015 1115    Assessment and Plan #1-suspected left posterior malleolus fracture-new diagnosis--clinically she patient appears to be stable does not appear to be having significant pain when she is nonweightbearing-will write orders to make nonweightbearing and obtain an orthopedic consult as soon as possible-also ice packs when necessary for comfort-as well as a light Ace wrap as tolerated    [id number]   LASSEN, ARLO C,

## 2015-07-31 ENCOUNTER — Encounter: Payer: Self-pay | Admitting: Internal Medicine

## 2015-07-31 ENCOUNTER — Inpatient Hospital Stay (HOSPITAL_COMMUNITY)
Admission: EM | Admit: 2015-07-31 | Discharge: 2015-08-21 | DRG: 871 | Disposition: E | Payer: Medicare Other | Attending: Pulmonary Disease | Admitting: Pulmonary Disease

## 2015-07-31 ENCOUNTER — Encounter (HOSPITAL_COMMUNITY): Payer: Self-pay | Admitting: Emergency Medicine

## 2015-07-31 ENCOUNTER — Emergency Department (HOSPITAL_COMMUNITY): Payer: Medicare Other

## 2015-07-31 ENCOUNTER — Inpatient Hospital Stay (HOSPITAL_COMMUNITY): Payer: Medicare Other

## 2015-07-31 DIAGNOSIS — G253 Myoclonus: Secondary | ICD-10-CM | POA: Diagnosis present

## 2015-07-31 DIAGNOSIS — N390 Urinary tract infection, site not specified: Secondary | ICD-10-CM | POA: Diagnosis present

## 2015-07-31 DIAGNOSIS — E876 Hypokalemia: Secondary | ICD-10-CM | POA: Diagnosis not present

## 2015-07-31 DIAGNOSIS — Z515 Encounter for palliative care: Secondary | ICD-10-CM

## 2015-07-31 DIAGNOSIS — Z87891 Personal history of nicotine dependence: Secondary | ICD-10-CM

## 2015-07-31 DIAGNOSIS — I469 Cardiac arrest, cause unspecified: Secondary | ICD-10-CM | POA: Diagnosis present

## 2015-07-31 DIAGNOSIS — Z7951 Long term (current) use of inhaled steroids: Secondary | ICD-10-CM | POA: Diagnosis not present

## 2015-07-31 DIAGNOSIS — Z7982 Long term (current) use of aspirin: Secondary | ICD-10-CM

## 2015-07-31 DIAGNOSIS — Z86711 Personal history of pulmonary embolism: Secondary | ICD-10-CM

## 2015-07-31 DIAGNOSIS — Z79899 Other long term (current) drug therapy: Secondary | ICD-10-CM | POA: Diagnosis not present

## 2015-07-31 DIAGNOSIS — Z978 Presence of other specified devices: Secondary | ICD-10-CM

## 2015-07-31 DIAGNOSIS — K219 Gastro-esophageal reflux disease without esophagitis: Secondary | ICD-10-CM | POA: Diagnosis present

## 2015-07-31 DIAGNOSIS — I959 Hypotension, unspecified: Secondary | ICD-10-CM

## 2015-07-31 DIAGNOSIS — Z452 Encounter for adjustment and management of vascular access device: Secondary | ICD-10-CM

## 2015-07-31 DIAGNOSIS — E785 Hyperlipidemia, unspecified: Secondary | ICD-10-CM | POA: Diagnosis present

## 2015-07-31 DIAGNOSIS — J9811 Atelectasis: Secondary | ICD-10-CM | POA: Diagnosis present

## 2015-07-31 DIAGNOSIS — R4182 Altered mental status, unspecified: Secondary | ICD-10-CM

## 2015-07-31 DIAGNOSIS — Z86718 Personal history of other venous thrombosis and embolism: Secondary | ICD-10-CM

## 2015-07-31 DIAGNOSIS — A419 Sepsis, unspecified organism: Secondary | ICD-10-CM | POA: Diagnosis present

## 2015-07-31 DIAGNOSIS — F419 Anxiety disorder, unspecified: Secondary | ICD-10-CM | POA: Diagnosis present

## 2015-07-31 DIAGNOSIS — E872 Acidosis: Secondary | ICD-10-CM | POA: Diagnosis present

## 2015-07-31 DIAGNOSIS — J690 Pneumonitis due to inhalation of food and vomit: Secondary | ICD-10-CM | POA: Diagnosis present

## 2015-07-31 DIAGNOSIS — I4891 Unspecified atrial fibrillation: Secondary | ICD-10-CM | POA: Diagnosis present

## 2015-07-31 DIAGNOSIS — F319 Bipolar disorder, unspecified: Secondary | ICD-10-CM | POA: Diagnosis present

## 2015-07-31 DIAGNOSIS — R570 Cardiogenic shock: Secondary | ICD-10-CM | POA: Diagnosis not present

## 2015-07-31 DIAGNOSIS — Z22322 Carrier or suspected carrier of Methicillin resistant Staphylococcus aureus: Secondary | ICD-10-CM | POA: Diagnosis not present

## 2015-07-31 DIAGNOSIS — Z66 Do not resuscitate: Secondary | ICD-10-CM | POA: Diagnosis not present

## 2015-07-31 DIAGNOSIS — J9622 Acute and chronic respiratory failure with hypercapnia: Secondary | ICD-10-CM | POA: Diagnosis present

## 2015-07-31 DIAGNOSIS — J189 Pneumonia, unspecified organism: Secondary | ICD-10-CM | POA: Diagnosis present

## 2015-07-31 DIAGNOSIS — J441 Chronic obstructive pulmonary disease with (acute) exacerbation: Secondary | ICD-10-CM | POA: Diagnosis present

## 2015-07-31 DIAGNOSIS — G40301 Generalized idiopathic epilepsy and epileptic syndromes, not intractable, with status epilepticus: Secondary | ICD-10-CM | POA: Diagnosis not present

## 2015-07-31 DIAGNOSIS — Z794 Long term (current) use of insulin: Secondary | ICD-10-CM

## 2015-07-31 DIAGNOSIS — G9341 Metabolic encephalopathy: Secondary | ICD-10-CM | POA: Diagnosis present

## 2015-07-31 DIAGNOSIS — R000 Tachycardia, unspecified: Secondary | ICD-10-CM | POA: Diagnosis present

## 2015-07-31 DIAGNOSIS — Z9289 Personal history of other medical treatment: Secondary | ICD-10-CM

## 2015-07-31 DIAGNOSIS — G931 Anoxic brain damage, not elsewhere classified: Secondary | ICD-10-CM | POA: Diagnosis not present

## 2015-07-31 DIAGNOSIS — E1165 Type 2 diabetes mellitus with hyperglycemia: Secondary | ICD-10-CM | POA: Diagnosis present

## 2015-07-31 DIAGNOSIS — Z7901 Long term (current) use of anticoagulants: Secondary | ICD-10-CM

## 2015-07-31 DIAGNOSIS — Z8541 Personal history of malignant neoplasm of cervix uteri: Secondary | ICD-10-CM | POA: Diagnosis not present

## 2015-07-31 DIAGNOSIS — E861 Hypovolemia: Secondary | ICD-10-CM | POA: Diagnosis present

## 2015-07-31 DIAGNOSIS — B9620 Unspecified Escherichia coli [E. coli] as the cause of diseases classified elsewhere: Secondary | ICD-10-CM | POA: Diagnosis present

## 2015-07-31 DIAGNOSIS — L8990 Pressure ulcer of unspecified site, unspecified stage: Secondary | ICD-10-CM | POA: Insufficient documentation

## 2015-07-31 DIAGNOSIS — I10 Essential (primary) hypertension: Secondary | ICD-10-CM | POA: Diagnosis present

## 2015-07-31 DIAGNOSIS — Z0189 Encounter for other specified special examinations: Secondary | ICD-10-CM

## 2015-07-31 DIAGNOSIS — J9690 Respiratory failure, unspecified, unspecified whether with hypoxia or hypercapnia: Secondary | ICD-10-CM

## 2015-07-31 DIAGNOSIS — J9601 Acute respiratory failure with hypoxia: Secondary | ICD-10-CM

## 2015-07-31 DIAGNOSIS — Z789 Other specified health status: Secondary | ICD-10-CM | POA: Diagnosis not present

## 2015-07-31 LAB — I-STAT ARTERIAL BLOOD GAS, ED
Acid-Base Excess: 1 mmol/L (ref 0.0–2.0)
BICARBONATE: 28.1 meq/L — AB (ref 20.0–24.0)
Bicarbonate: 29.4 mEq/L — ABNORMAL HIGH (ref 20.0–24.0)
O2 SAT: 98 %
O2 SAT: 95 %
PCO2 ART: 65 mmHg — AB (ref 35.0–45.0)
PCO2 ART: 70.7 mmHg — AB (ref 35.0–45.0)
PH ART: 7.227 — AB (ref 7.350–7.450)
PO2 ART: 131 mmHg — AB (ref 80.0–100.0)
TCO2: 30 mmol/L (ref 0–100)
TCO2: 32 mmol/L (ref 0–100)
pH, Arterial: 7.244 — ABNORMAL LOW (ref 7.350–7.450)
pO2, Arterial: 95 mmHg (ref 80.0–100.0)

## 2015-07-31 LAB — COMPREHENSIVE METABOLIC PANEL
ALT: 53 U/L (ref 14–54)
AST: 88 U/L — AB (ref 15–41)
Albumin: 3.4 g/dL — ABNORMAL LOW (ref 3.5–5.0)
Alkaline Phosphatase: 48 U/L (ref 38–126)
Anion gap: 16 — ABNORMAL HIGH (ref 5–15)
BUN: 11 mg/dL (ref 6–20)
CHLORIDE: 96 mmol/L — AB (ref 101–111)
CO2: 24 mmol/L (ref 22–32)
Calcium: 8.3 mg/dL — ABNORMAL LOW (ref 8.9–10.3)
Creatinine, Ser: 0.95 mg/dL (ref 0.44–1.00)
GFR calc Af Amer: >60 mL/min (ref >60)
GFR calc non Af Amer: 58 mL/min — ABNORMAL LOW (ref >60)
Glucose, Bld: 256 mg/dL — ABNORMAL HIGH (ref 65–99)
Potassium: 4.1 mmol/L (ref 3.5–5.1)
Sodium: 136 mmol/L (ref 135–145)
Total Bilirubin: 0.4 mg/dL (ref 0.3–1.2)
Total Protein: 6.9 g/dL (ref 6.5–8.1)

## 2015-07-31 LAB — CBC WITH DIFFERENTIAL/PLATELET
Basophils Absolute: 0 10*3/uL (ref 0.0–0.1)
Basophils Relative: 0 % (ref 0–1)
Eosinophils Absolute: 0.1 10*3/uL (ref 0.0–0.7)
Eosinophils Relative: 1 % (ref 0–5)
HCT: 38 % (ref 36.0–46.0)
Hemoglobin: 11.6 g/dL — ABNORMAL LOW (ref 12.0–15.0)
Lymphocytes Relative: 29 % (ref 12–46)
Lymphs Abs: 2.8 10*3/uL (ref 0.7–4.0)
MCH: 28.8 pg (ref 26.0–34.0)
MCHC: 30.5 g/dL (ref 30.0–36.0)
MCV: 94.3 fL (ref 78.0–100.0)
Monocytes Absolute: 0.5 10*3/uL (ref 0.1–1.0)
Monocytes Relative: 5 % (ref 3–12)
Neutro Abs: 6.2 10*3/uL (ref 1.7–7.7)
Neutrophils Relative %: 65 % (ref 43–77)
Platelets: 229 10*3/uL (ref 150–400)
RBC: 4.03 MIL/uL (ref 3.87–5.11)
RDW: 14.7 % (ref 11.5–15.5)
WBC: 9.6 10*3/uL (ref 4.0–10.5)

## 2015-07-31 LAB — URINE MICROSCOPIC-ADD ON

## 2015-07-31 LAB — I-STAT CHEM 8, ED
BUN: 13 mg/dL (ref 6–20)
CALCIUM ION: 1.05 mmol/L — AB (ref 1.13–1.30)
CHLORIDE: 95 mmol/L — AB (ref 101–111)
CREATININE: 0.9 mg/dL (ref 0.44–1.00)
Glucose, Bld: 264 mg/dL — ABNORMAL HIGH (ref 65–99)
HCT: 38 % (ref 36.0–46.0)
Hemoglobin: 12.9 g/dL (ref 12.0–15.0)
Potassium: 4.1 mmol/L (ref 3.5–5.1)
Sodium: 137 mmol/L (ref 135–145)
TCO2: 28 mmol/L (ref 0–100)

## 2015-07-31 LAB — URINALYSIS, ROUTINE W REFLEX MICROSCOPIC
Bilirubin Urine: NEGATIVE
Glucose, UA: 250 mg/dL — AB
Ketones, ur: NEGATIVE mg/dL
Nitrite: NEGATIVE
Protein, ur: 100 mg/dL — AB
Specific Gravity, Urine: 1.01 (ref 1.005–1.030)
Urobilinogen, UA: 1 mg/dL (ref 0.0–1.0)
pH: 6 (ref 5.0–8.0)

## 2015-07-31 LAB — I-STAT TROPONIN, ED: Troponin i, poc: 0 ng/mL (ref 0.00–0.08)

## 2015-07-31 LAB — PROTIME-INR
INR: 1.64 — ABNORMAL HIGH (ref 0.00–1.49)
Prothrombin Time: 19.4 seconds — ABNORMAL HIGH (ref 11.6–15.2)

## 2015-07-31 LAB — APTT: aPTT: 31 seconds (ref 24–37)

## 2015-07-31 LAB — I-STAT CG4 LACTIC ACID, ED: Lactic Acid, Venous: 5.61 mmol/L (ref 0.5–2.0)

## 2015-07-31 MED ORDER — SODIUM CHLORIDE 0.9 % IV SOLN
Freq: Once | INTRAVENOUS | Status: AC
  Administered 2015-07-31: 22:00:00 via INTRAVENOUS

## 2015-07-31 MED ORDER — SODIUM CHLORIDE 0.9 % IV SOLN
2000.0000 mL | Freq: Once | INTRAVENOUS | Status: AC
  Administered 2015-08-01: 2000 mL via INTRAVENOUS

## 2015-07-31 MED ORDER — ASPIRIN 81 MG PO CHEW: 81.0000 mg | CHEWABLE_TABLET | Freq: Every day | ORAL | Status: DC

## 2015-07-31 MED ORDER — MIDAZOLAM BOLUS VIA INFUSION
1.0000 mg | INTRAVENOUS | Status: DC | PRN
  Filled 2015-07-31: qty 1

## 2015-07-31 MED ORDER — SODIUM CHLORIDE 0.9 % IV BOLUS (SEPSIS)
1000.0000 mL | Freq: Once | INTRAVENOUS | Status: AC
  Administered 2015-07-31: 1000 mL via INTRAVENOUS

## 2015-07-31 MED ORDER — FENTANYL CITRATE (PF) 100 MCG/2ML IJ SOLN
50.0000 ug | INTRAMUSCULAR | Status: DC | PRN
Start: 2015-07-31 — End: 2015-07-31
  Administered 2015-07-31: 50 ug via INTRAVENOUS

## 2015-07-31 MED ORDER — ASPIRIN 300 MG RE SUPP: 300.0000 mg | RECTAL | Status: DC

## 2015-07-31 MED ORDER — MIDAZOLAM HCL 2 MG/2ML IJ SOLN
1.0000 mg | Freq: Once | INTRAMUSCULAR | Status: AC
Start: 2015-07-31 — End: 2015-07-31

## 2015-07-31 MED ORDER — NOREPINEPHRINE BITARTRATE 1 MG/ML IV SOLN
0.0000 ug/min | INTRAVENOUS | Status: DC
  Administered 2015-08-02: 5 ug/min via INTRAVENOUS
  Administered 2015-08-02 (×2): 10 ug/min via INTRAVENOUS
  Administered 2015-08-02: 1 ug/min via INTRAVENOUS
  Filled 2015-07-31 (×3): qty 4

## 2015-07-31 MED ORDER — CISATRACURIUM BOLUS VIA INFUSION
0.1000 mg/kg | Freq: Once | INTRAVENOUS | Status: AC
  Administered 2015-07-31: 9.2 mg via INTRAVENOUS
  Filled 2015-07-31: qty 10

## 2015-07-31 MED ORDER — HEPARIN BOLUS VIA INFUSION
2000.0000 [IU] | Freq: Once | INTRAVENOUS | Status: AC
Start: 2015-07-31 — End: 2015-07-31
  Administered 2015-07-31: 2000 [IU] via INTRAVENOUS
  Filled 2015-07-31: qty 2000

## 2015-07-31 MED ORDER — ALBUTEROL SULFATE (2.5 MG/3ML) 0.083% IN NEBU: 2.5000 mg | INHALATION_SOLUTION | RESPIRATORY_TRACT | Status: DC | PRN

## 2015-07-31 MED ORDER — CISATRACURIUM BESYLATE (PF) 200 MG/20ML IV SOLN
1.0000 ug/kg/min | INTRAVENOUS | Status: DC
  Administered 2015-08-01: 1 ug/kg/min via INTRAVENOUS
  Administered 2015-08-02: 1.5 ug/kg/min via INTRAVENOUS
  Filled 2015-07-31 (×2): qty 20

## 2015-07-31 MED ORDER — SODIUM CHLORIDE 0.9 % IV SOLN
1.0000 g | Freq: Once | INTRAVENOUS | Status: AC
  Administered 2015-08-01: 1 g via INTRAVENOUS
  Filled 2015-07-31: qty 10

## 2015-07-31 MED ORDER — FENTANYL CITRATE (PF) 100 MCG/2ML IJ SOLN: 50.0000 ug | Freq: Once | INTRAMUSCULAR | Status: AC

## 2015-07-31 MED ORDER — MIDAZOLAM HCL 5 MG/ML IJ SOLN
1.0000 mg/h | INTRAMUSCULAR | Status: DC
  Administered 2015-07-31: 1 mg/h via INTRAVENOUS
  Administered 2015-08-01: 7 mg/h via INTRAVENOUS
  Administered 2015-08-01 (×2): 6 mg/h via INTRAVENOUS
  Administered 2015-08-02: 5 mg/h via INTRAVENOUS
  Filled 2015-07-31 (×5): qty 10

## 2015-07-31 MED ORDER — PANTOPRAZOLE SODIUM 40 MG IV SOLR
40.0000 mg | Freq: Every day | INTRAVENOUS | Status: DC
Start: 2015-07-31 — End: 2015-08-04
  Administered 2015-08-01 – 2015-08-03 (×4): 40 mg via INTRAVENOUS
  Filled 2015-07-31 (×5): qty 40

## 2015-07-31 MED ORDER — SIMVASTATIN 10 MG PO TABS
10.0000 mg | ORAL_TABLET | Freq: Every day | ORAL | Status: DC
  Filled 2015-07-31 (×4): qty 1

## 2015-07-31 MED ORDER — ARTIFICIAL TEARS OP OINT
1.0000 | TOPICAL_OINTMENT | Freq: Three times a day (TID) | OPHTHALMIC | Status: DC
Start: 2015-07-31 — End: 2015-08-04
  Administered 2015-08-01 – 2015-08-03 (×8): 1 via OPHTHALMIC
  Filled 2015-07-31: qty 3.5

## 2015-07-31 MED ORDER — FENTANYL BOLUS VIA INFUSION
25.0000 ug | INTRAVENOUS | Status: DC | PRN
  Filled 2015-07-31: qty 25

## 2015-07-31 MED ORDER — INSULIN ASPART 100 UNIT/ML ~~LOC~~ SOLN
2.0000 [IU] | SUBCUTANEOUS | Status: DC
  Administered 2015-08-01: 6 [IU] via SUBCUTANEOUS

## 2015-07-31 MED ORDER — DEXTROSE 5 % IV SOLN
0.0000 ug/min | Freq: Once | INTRAVENOUS | Status: DC
  Filled 2015-07-31: qty 4

## 2015-07-31 MED ORDER — CISATRACURIUM BOLUS VIA INFUSION
0.0500 mg/kg | INTRAVENOUS | Status: DC | PRN
  Filled 2015-07-31: qty 5

## 2015-07-31 MED ORDER — IPRATROPIUM-ALBUTEROL 0.5-2.5 (3) MG/3ML IN SOLN
3.0000 mL | Freq: Four times a day (QID) | RESPIRATORY_TRACT | Status: DC
  Administered 2015-08-01 – 2015-08-04 (×12): 3 mL via RESPIRATORY_TRACT
  Filled 2015-07-31 (×14): qty 3

## 2015-07-31 MED ORDER — FENTANYL CITRATE (PF) 100 MCG/2ML IJ SOLN
INTRAMUSCULAR | Status: AC
  Filled 2015-07-31: qty 2

## 2015-07-31 MED ORDER — METHYLPREDNISOLONE SODIUM SUCC 40 MG IJ SOLR
40.0000 mg | Freq: Two times a day (BID) | INTRAMUSCULAR | Status: DC
  Administered 2015-08-01: 40 mg via INTRAVENOUS
  Filled 2015-07-31 (×3): qty 1

## 2015-07-31 MED ORDER — MIDAZOLAM HCL 2 MG/2ML IJ SOLN: 2.0000 mg | INTRAMUSCULAR | Status: DC | PRN

## 2015-07-31 MED ORDER — CEFTRIAXONE SODIUM 1 G IJ SOLR
1.0000 g | INTRAMUSCULAR | Status: DC
  Administered 2015-07-31: 1 g via INTRAVENOUS
  Filled 2015-07-31: qty 10

## 2015-07-31 MED ORDER — SODIUM CHLORIDE 0.9 % IV SOLN
25.0000 ug/h | INTRAVENOUS | Status: DC
  Administered 2015-07-31: 25 ug/h via INTRAVENOUS
  Administered 2015-08-01 – 2015-08-02 (×3): 300 ug/h via INTRAVENOUS
  Filled 2015-07-31 (×4): qty 50

## 2015-07-31 MED ORDER — HEPARIN (PORCINE) IN NACL 100-0.45 UNIT/ML-% IJ SOLN
700.0000 [IU]/h | INTRAMUSCULAR | Status: DC
  Administered 2015-08-01: 550 [IU]/h via INTRAVENOUS
  Filled 2015-07-31: qty 250

## 2015-07-31 NOTE — ED Notes (Signed)
Patient is [facility], she was at dinner, went back to room after supper, staff walked past room and found her purple and called EMS.  CPR was started almost immediately, FD across street from [facility].  CPR was performed for approx 12 mins with EMS, found in PEA, rate of 30.  Patient had less than 20 mins of CPR.  Patient was given 3 epis by EMS, pulses regain as Afib RVR rate of 180's, now patient is 130's.  Patient does have bilat BS, intubated by EMS with 7.0 tube, 16G NG placed.  I/O placed in left tibia.  CBG of 292.

## 2015-07-31 NOTE — ED Provider Notes (Signed)
CSN: [id number]     Arrival date & time 08/04/2015  2007 History   First MD Initiated Contact with Patient 07/26/2015 2018     Chief Complaint  Patient presents with  . Cardiac Arrest     (Consider location/radiation/quality/duration/timing/severity/associated sxs/prior Treatment) The history is provided by the EMS personnel, a relative and medical records. The history is limited by the condition of the patient. No language interpreter was used.   Patient is a 74 year old female with past medical history of COPD, DVT, PE, diabetes who presents today with cardiac arrest. Patient resides at a nursing facility and was notably seen at dinnertime and following dinnertime staff walked past her room and found the patient apparently purple. EMS was called and CPR was started immediately. CPR was performed for proximally 12 minutes with EMS and they did notably find her PA. Patient did receive 3 rounds of epinephrine and pulses were regained and patient was notably in A. fib with RVR. Patient was intubated by EMS with a 7.0 endotracheal tube. Left IO was placed in the left tibia. Blood glucose was noted at 292. Patient also has nasogastric tube was placed as well. History is limited as patient is currently unable to find further information due to her condition. Patient was notably hypotensive in route. She is currently getting 1 L of fluid on arrival.  Past Medical History  Diagnosis Date  . Anxiety   . Arthritis   . COPD (chronic obstructive pulmonary disease)   . GERD (gastroesophageal reflux disease)   . Hyperlipidemia   . Osteoporosis   . DVT (deep venous thrombosis)   . PE (pulmonary thromboembolism)   . Hypertension      PT DENIES....ON NO  MEDS  . Diabetes mellitus   . Cancer 1969    cervical  . Pneumonia     h/o  . Bronchitis     h/o  . Blood transfusion   . Anemia   . Bipolar affect, depressed   . Acute thromboembolism of deep veins of lower extremity 10/02/2009    Qualifier:  Diagnosis of  By: Martinique, Bonnie    . Septic shock 12/25/2013  . 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 Vena cava filter placement    . 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 Eye surgery      CATARACTS  . Achilles tendon lengthening  11/18/11    and repair w/posterior tibial tendon lengthening; right  foot  . Cholecystectomy    . Abdominal hysterectomy  1969    "womb taken out for cervical cancer"  . Cataract extraction, bilateral  ~ 2008  . Tonsillectomy      "as a child"  . Achilles tendon surgery  11/18/2011    Procedure: ACHILLES TENDON REPAIR;  Surgeon: Wylene Simmer, MD;  Location: [place];  Service: Orthopedics;  Laterality: Right;  Right Posterior Tibial Tendon Lenghtening and Tendon Achilles Lenghtening    Family History  Problem Relation Age of Onset  . Heart disease Father   . Heart attack Father   . Clotting disorder Father   . Stroke Mother    Social History  Substance Use Topics  . Smoking status: Former Smoker -- 1.00 packs/day    Types: Cigarettes    Quit date: 11/14/1986  . Smokeless tobacco: Never Used     Comment: pt cannot recall the total amt of time she smoked.  . Alcohol Use: No  OB History    No data available     Review of Systems  Unable to perform ROS: Acuity of condition      Allergies  Benzodiazepines; Morphine and related; Olanzapine; and Sulfamethoxazole-trimethoprim  Home Medications   Prior to Admission medications   Medication Sig Start Date End Date Taking? Authorizing Provider  acetaminophen (TYLENOL) 325 MG tablet Take 650 mg by mouth every 4 (four) hours as needed for mild pain.   Yes Historical Provider, MD  ALPRAZolam Duanne Moron) 1 MG tablet Take 1 tablet (1 mg total) by mouth 4 (four) times daily. Take at 0600, 1000, 1400, and 1800. Patient taking differently: Take 1 mg by mouth 4 (four) times daily. 0600, 1000, 1400, 1600 05/28/15  Yes Velvet Bathe, MD  alum & mag hydroxide-simeth  (MAALOX/MYLANTA) 200-200-20 MG/5ML suspension Take 30 mLs by mouth every 4 (four) hours as needed for indigestion.   Yes Historical Provider, MD  antiseptic oral rinse (BIOTENE) LIQD 15 mLs by Mouth Rinse route every 4 (four) hours as needed for dry mouth.   Yes Historical Provider, MD  apixaban (ELIQUIS) 5 MG TABS tablet Take 2 tablets (10 mg total) by mouth 2 (two) times daily. 03/16/15  Yes Hyman Bible, PA-C  Artificial Tear Ointment (REFRESH LACRI-LUBE) OINT Place 1 application into both eyes at bedtime.   Yes Historical Provider, MD  aspirin 81 MG chewable tablet Chew 81 mg by mouth daily with breakfast.    Yes Historical Provider, MD  benzocaine-menthol (CHLORASEPTIC) 6-10 MG lozenge Take 1 lozenge by mouth every 2 (two) hours as needed for sore throat.   Yes Historical Provider, MD  bisacodyl (DULCOLAX) 10 MG suppository Place 10 mg rectally as directed. insert three times a week- Monday, Wednesday and Friday, and if no bowel movement place 1 every 2 days   Yes Historical Provider, MD  Cranberry-Vitamin C-Inulin (UTI-STAT) LIQD Take 30 mLs by mouth every morning.    Yes Historical Provider, MD  diltiazem (CARDIZEM CD) 120 MG 24 hr capsule Take 1 capsule (120 mg total) by mouth daily. 05/28/15  Yes Velvet Bathe, MD  esomeprazole (NEXIUM) 40 MG capsule Take 40 mg by mouth daily before breakfast.    Yes Historical Provider, MD  fenofibrate (TRICOR) 145 MG tablet Take 145 mg by mouth daily.   Yes Historical Provider, MD  fish oil-omega-3 fatty acids 1000 MG capsule Take 1 g by mouth 2 (two) times daily.    Yes Historical Provider, MD  Fluticasone-Salmeterol (ADVAIR DISKUS) 250-50 MCG/DOSE AEPB Inhale 1 puff into the lungs 2 (two) times daily. 03/11/14  Yes Kathee Delton, MD  gabapentin (NEURONTIN) 300 MG capsule Take 3 capsules (900 mg total) by mouth 3 (three) times daily. 01/01/14  Yes Geradine Girt, DO  GLUCERNA (GLUCERNA) LIQD Take 1 Can by mouth 2 (two) times daily between meals.   Yes  Historical Provider, MD  HYDROcodone-acetaminophen (NORCO/VICODIN) 5-325 MG per tablet Take 1 tablet by mouth every 4 (four) hours as needed for moderate pain.   Yes Historical Provider, MD  insulin aspart (NOVOLOG) 100 UNIT/ML FlexPen Inject 3-5 Units into the skin 3 (three) times daily before meals. Give 3 units before meals with an additional 5 units for CBG> or < 150   Yes Historical Provider, MD  insulin glargine (LANTUS) 100 UNIT/ML injection Inject 20-22 Units into the skin 2 (two) times daily. Takes 20 units in the morning and 22 units at bedtime 01/01/14  Yes Geradine Girt, DO  levalbuterol [facility] HFA) 45  MCG/ACT inhaler Inhale 2 puffs into the lungs every 4 (four) hours as needed for wheezing.    Yes Historical Provider, MD  levalbuterol (XOPENEX) 1.25 MG/0.5ML nebulizer solution Take 1.25 mg by nebulization 4 (four) times daily as needed for wheezing or shortness of breath. 01/01/14  Yes Geradine Girt, DO  loperamide (IMODIUM) 2 MG capsule Take 2 mg by mouth as needed for diarrhea or loose stools (do not exceed 8mg  /24 hours).    Yes Historical Provider, MD  loratadine (CLARITIN) 10 MG tablet Take 10 mg by mouth every morning.    Yes Historical Provider, MD  magnesium hydroxide (MILK OF MAGNESIA) 400 MG/5ML suspension Take 30 mLs by mouth daily as needed for mild constipation.   Yes Historical Provider, MD  metoprolol tartrate (LOPRESSOR) 50 MG tablet Take 1 tablet (50 mg total) by mouth 2 (two) times daily. At 0800 and 1700. 05/28/15  Yes Velvet Bathe, MD  OxyCODONE (OXYCONTIN) 10 mg T12A 12 hr tablet Take one tablet by mouth every 12 hours PRN pain. Do not crush Patient taking differently: Take 10 mg by mouth every 12 (twelve) hours.  05/28/15  Yes Velvet Bathe, MD  OXYGEN Inhale 2 L into the lungs continuous. Via nasal cannula.   Yes Historical Provider, MD  Polyethyl Glycol-Propyl Glycol (SYSTANE) 0.4-0.3 % SOLN Place 1 drop into both eyes 4 (four) times daily.   Yes Historical Provider, MD   polyethylene glycol (MIRALAX / GLYCOLAX) packet Take 17 g by mouth daily.    Yes Historical Provider, MD  polyvinyl alcohol (ARTIFICIAL TEARS) 1.4 % ophthalmic solution Place 1 drop into both eyes 4 (four) times daily as needed for dry eyes.   Yes Historical Provider, MD  polyvinyl alcohol (LIQUIFILM TEARS) 1.4 % ophthalmic solution Place 2 drops into both eyes 4 (four) times daily as needed for dry eyes.   Yes Historical Provider, MD  potassium chloride (K-DUR,KLOR-CON) 10 MEQ tablet Take 5 mEq by mouth every evening.  08/25/11  Yes Judithann Sheen, MD  QUEtiapine (SEROQUEL) 100 MG tablet Take 100 mg by mouth at bedtime. Give 1 tablet along with Seroquel 50mg  to = 150mg  at bedtime   Yes Historical Provider, MD  QUEtiapine (SEROQUEL) 50 MG tablet Take 50 mg by mouth at bedtime. Take along with Seroquel 100mg  to = 150mg  at bedtime.   Yes Historical Provider, MD  risperiDONE (RISPERDAL) 2 MG tablet Take 2 mg by mouth 2 (two) times daily.   Yes Historical Provider, MD  simvastatin (ZOCOR) 10 MG tablet Take 10 mg by mouth daily at 6 PM.    Yes Historical Provider, MD  sodium phosphate (FLEET) enema Place 1 enema rectally daily as needed (constipation). follow package directions   Yes Historical Provider, MD  tiotropium (SPIRIVA) 18 MCG inhalation capsule Place 1 capsule (18 mcg total) into inhaler and inhale daily. 08/25/11  Yes Judithann Sheen, MD  torsemide (DEMADEX) 20 MG tablet Take 40 mg by mouth daily with breakfast.    Yes Historical Provider, MD  traZODone (DESYREL) 150 MG tablet Take 150 mg by mouth at bedtime.   Yes Historical Provider, MD  venlafaxine XR (EFFEXOR-XR) 150 MG 24 hr capsule Take 300 mg by mouth every morning.  01/31/12  Yes Waldemar Dickens, MD   BP 99/54 mmHg  Pulse 87  Temp(Src) 99.1 F (37.3 C) (Rectal)  Resp 26  Ht 5\' 2"  (1.575 m)  Wt 203 lb 4.2 oz (92.2 kg)  BMI 37.17 kg/m2  SpO2 98% Physical  Exam  Constitutional: No distress. She is intubated.  Eyes: Conjunctivae are  normal. Pupils are equal, round, and reactive to light. Right eye exhibits no discharge. Left eye exhibits no discharge.  Neck: Normal range of motion. Neck supple.  Cardiovascular: An irregularly irregular rhythm present. Tachycardia present.   Pulmonary/Chest: She is intubated.  Abdominal: Soft. She exhibits distension.  Neurological: She is unresponsive. GCS eye subscore is 4. GCS verbal subscore is 1. GCS motor subscore is 1.  Skin: Skin is dry. She is not diaphoretic. No pallor.  Nursing note and vitals reviewed.   ED Course  CRITICAL CARE Performed by: Theodosia Quay Authorized by: Debby Freiberg Total critical care time: 30 minutes Critical care was necessary to treat or prevent imminent or life-threatening deterioration of the following conditions: cardiac failure and circulatory failure. Critical care was time spent personally by me on the following activities: development of treatment plan with patient or surrogate, discussions with consultants, evaluation of patient's response to treatment, examination of patient, obtaining history from patient or surrogate, ordering and performing treatments and interventions, ordering and review of laboratory studies, ordering and review of radiographic studies and review of old charts.   (including critical care time) Labs Review Labs Reviewed  CBC WITH DIFFERENTIAL/PLATELET - Abnormal; Notable for the following:    Hemoglobin 11.6 (*)    All other components within normal limits  COMPREHENSIVE METABOLIC PANEL - Abnormal; Notable for the following:    Chloride 96 (*)    Glucose, Bld 256 (*)    Calcium 8.3 (*)    Albumin 3.4 (*)    AST 88 (*)    GFR calc non Af Amer 58 (*)    Anion gap 16 (*)    All other components within normal limits  URINALYSIS, ROUTINE W REFLEX MICROSCOPIC (NOT AT [facility]) - Abnormal; Notable for the following:    Glucose, UA 250 (*)    Hgb urine dipstick SMALL (*)    Protein, ur 100 (*)    Leukocytes, UA LARGE (*)     All other components within normal limits  URINE MICROSCOPIC-ADD ON - Abnormal; Notable for the following:    Bacteria, UA MANY (*)    Casts HYALINE CASTS (*)    All other components within normal limits  I-STAT CG4 LACTIC ACID, ED - Abnormal; Notable for the following:    Lactic Acid, Venous 5.61 (*)    All other components within normal limits  I-STAT CHEM 8, ED - Abnormal; Notable for the following:    Chloride 95 (*)    Glucose, Bld 264 (*)    Calcium, Ion 1.05 (*)    All other components within normal limits  I-STAT ARTERIAL BLOOD GAS, ED - Abnormal; Notable for the following:    pH, Arterial 7.244 (*)    pCO2 arterial 65.0 (*)    pO2, Arterial 131.0 (*)    Bicarbonate 28.1 (*)    All other components within normal limits  I-STAT ARTERIAL BLOOD GAS, ED - Abnormal; Notable for the following:    pH, Arterial 7.227 (*)    pCO2 arterial 70.7 (*)    Bicarbonate 29.4 (*)    All other components within normal limits  CULTURE, BLOOD (ROUTINE X 2)  CULTURE, BLOOD (ROUTINE X 2)  URINE CULTURE  CULTURE, RESPIRATORY (NON-EXPECTORATED)  TROPONIN I  TROPONIN I  TROPONIN I  BASIC METABOLIC PANEL  BASIC METABOLIC PANEL  BASIC METABOLIC PANEL  BASIC METABOLIC PANEL  PROTIME-INR  PROTIME-INR  APTT  APTT  BLOOD GAS, ARTERIAL  CBC  MAGNESIUM  PHOSPHORUS  BASIC METABOLIC PANEL  LACTIC ACID, PLASMA  LACTIC ACID, PLASMA  LACTIC ACID, PLASMA  HEPARIN LEVEL (UNFRACTIONATED)  APTT  I-STAT TROPOININ, ED    Imaging Review Dg Chest Portable 1 View  07/23/2015   CLINICAL DATA:  74 year old female status post intubation.  EXAM: PORTABLE CHEST - 1 VIEW  COMPARISON:  Chest x-ray 06/04/2015.  FINDINGS: An endotracheal tube is in place with tip 1.0 cm above the carina. Nasogastric tube extends into the distal third of the esophagus. Lung volumes are low. There are some bibasilar opacities favored to reflect areas of subsegmental atelectasis. No acute consolidative airspace disease. No  pleural effusions. No pneumothorax. No evidence of pulmonary edema. Heart size appears borderline enlarged. Upper mediastinal contours are distorted by patient's rotation to the right. Atherosclerosis in the thoracic aorta.  IMPRESSION: 1. Support apparatus, as above. Endotracheal tube is low, and should be withdrawn 2-3 cm for more optimal placement. Nasogastric to needs to be advanced into the stomach approximately 10 cm. 2. Low lung volumes with probable bibasilar subsegmental atelectasis. 3. Atherosclerosis.   Electronically Signed   By: Vinnie Langton M.D.   On: 08/17/2015 21:03   I, Theodosia Quay, personally reviewed and evaluated these images and lab results as part of my medical decision-making.   EKG Interpretation None      MDM   Final diagnoses:  Cardiac arrest  Hypotension, unspecified hypotension type  Endotracheally intubated  Tachycardia    Patient presents today status post cardiac arrest. On arrival patient is notably hypertensive but is slowly responding to IV fluids. No acute changes on EKG that would indicate post cardiac arrest STEMI. Obtained labs including blood and urine cultures to rule out infectious etiology. Patient also has a history of pulmonary embolism and this could be related to this, however patient is not stable for intermittent imaging studies at this point. Patient is spontaneously opening eyes but is not obeying commands and shows little movement spontaneously. Patient is intubated and O2 saturations are in the high 90s on the ventilator. She is acidotic and has lactic acidemia likely secondary to post arrest. Unclear etiology at this point but patient will warrant critical care evaluation in the intensive care unit. Did call and discuss with the intensive care team and they will plan to admit the patient for further evaluation and management.    Theodosia Quay, MD 08/19/2015 [id number]  Debby Freiberg, MD 08/01/15 [phone]

## 2015-07-31 NOTE — H&P (Signed)
PULMONARY / CRITICAL CARE MEDICINE   Name: [name] MRN: [id number] DOB: 10-Jul-1941    ADMISSION DATE:  08/15/2015 CONSULTATION DATE:  08/14/2015  REFERRING MD :  EDP  CHIEF COMPLAINT:  Cardiac Arrest  INITIAL PRESENTATION:  74 y.o. F brought to [facility] ED 8/11 after she suffered a 20 min PEA arrest.  PCCM called for admission and for consideration of hypothermia.    STUDIES:  CXR 8/11 >>> Low volumes with bibasilar subsegmental atelectasis. CT head 8/11 >>>  SIGNIFICANT EVENTS: 8/11 - admitted after ~ 20 minute PEA arrest.   HISTORY OF PRESENT ILLNESS:  Pt is encephalopathic; therefore, this HPI is obtained from chart review. [name] is a 74 y.o. F with PMH as outlined below and who resides at [facility] home.  She was brought to [facility] ED 8/11 after nursing home staff found her in her room and with purple skin tone.  She had no pulses when found and CPR was performed immediately for roughly 20 minutes total.  She received epi x 3 and was intubated in the field.  Initial rhythm on EMS arrival was PEA.  After ROSC, she had A.fib with RVR (HR in 180's), this spontaneously resolved after arrival in ED.  Per family, pt had called one of her daughters earlier in the afternoon complaining of nausea and vomiting.  As far as daughter knows, pt did not eat dinner since she had vomiting after lunch.  From what her daughter knows, pt went to take a nap this afternoon around 4 or 5 and except for the vomiting, she was in her USOH.  She has not had any recent fevers/chills/sweats, headaches, chest pain, SOB, N/V/D, abdominal pain, myalgias.  In ED, she remained unresponsive and had no sedation.  Family at bedside states that she has been hospitalized in the past for sepsis and that on prior conversations regarding advanced directives, pt indicated that she would like to remain full code.   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  has past surgical history that includes Vena cava filter placement; CANCER OF WOMB; Eye surgery; Achilles tendon lengthening (11/18/11); Cholecystectomy; Abdominal hysterectomy (1969); Cataract extraction, bilateral (~ 2008); Tonsillectomy; and Achilles tendon surgery (11/18/2011). Prior to Admission medications   Medication Sig Start Date End Date Taking? Authorizing Provider  acetaminophen (TYLENOL) 325 MG tablet Take 650 mg by mouth every 4 (four) hours as needed for mild pain.   Yes Historical Provider, MD  ALPRAZolam Duanne Moron) 1 MG tablet Take 1 tablet (1 mg total) by mouth 4 (four) times daily. Take at 0600, 1000, 1400, and 1800. Patient taking differently: Take 1 mg by mouth 4 (four) times daily. 0600, 1000, 1400, 1600 05/28/15  Yes Velvet Bathe, MD  alum & mag hydroxide-simeth (MAALOX/MYLANTA) 200-200-20 MG/5ML suspension Take 30 mLs by mouth every 4 (four) hours as needed for indigestion.   Yes Historical Provider, MD  antiseptic oral rinse (BIOTENE) LIQD 15 mLs by Mouth Rinse route every 4 (four) hours as needed for dry mouth.   Yes Historical Provider, MD  apixaban (ELIQUIS) 5 MG TABS tablet Take 2 tablets (10 mg total) by mouth 2 (two) times daily. 03/16/15  Yes Hyman Bible, PA-C  Artificial Tear Ointment (REFRESH LACRI-LUBE) OINT Place 1 application into both eyes at bedtime.   Yes Historical Provider, MD  aspirin 81 MG chewable tablet Chew 81 mg by mouth daily with breakfast.    Yes Historical Provider, MD  benzocaine-menthol (CHLORASEPTIC) 6-10 MG lozenge Take 1 lozenge by mouth every  2 (two) hours as needed for sore throat.   Yes Historical Provider, MD  bisacodyl (DULCOLAX) 10 MG suppository Place 10 mg rectally as directed. insert three times a week- Monday, Wednesday and Friday, and if no bowel movement place 1 every 2 days   Yes Historical Provider, MD  Cranberry-Vitamin C-Inulin (UTI-STAT) LIQD Take 30 mLs by mouth every morning.    Yes Historical Provider, MD  diltiazem (CARDIZEM CD) 120 MG 24 hr capsule Take 1 capsule (120 mg total) by mouth daily. 05/28/15  Yes Velvet Bathe, MD  esomeprazole (NEXIUM) 40 MG capsule Take 40 mg by mouth daily before breakfast.    Yes Historical Provider, MD  fenofibrate (TRICOR) 145 MG tablet Take 145 mg by mouth daily.   Yes Historical Provider, MD  fish oil-omega-3 fatty acids 1000 MG capsule Take 1 g by mouth 2 (two) times daily.    Yes Historical Provider, MD  Fluticasone-Salmeterol (ADVAIR DISKUS) 250-50 MCG/DOSE AEPB Inhale 1 puff into the lungs 2 (two) times daily. 03/11/14  Yes Kathee Delton, MD  gabapentin (NEURONTIN) 300 MG capsule Take 3 capsules (900 mg total) by mouth 3 (three) times daily. 01/01/14  Yes Geradine Girt, DO  GLUCERNA (GLUCERNA) LIQD Take 1 Can by mouth 2 (two) times daily between meals.   Yes Historical Provider, MD  HYDROcodone-acetaminophen (NORCO/VICODIN) 5-325 MG per tablet Take 1 tablet by mouth every 4 (four) hours as needed for moderate pain.   Yes Historical Provider, MD  insulin aspart (NOVOLOG) 100 UNIT/ML FlexPen Inject 3-5 Units into the skin 3 (three) times daily before meals. Give 3 units before meals with an additional 5 units for CBG> or < 150   Yes Historical Provider, MD  insulin glargine (LANTUS) 100 UNIT/ML injection Inject 20-22 Units into the skin 2 (two) times daily. Takes 20 units in the morning and 22 units at bedtime 01/01/14  Yes Geradine Girt, DO  levalbuterol (XOPENEX HFA) 45 MCG/ACT inhaler Inhale 2 puffs into the lungs every 4 (four) hours as needed for wheezing.    Yes Historical  Provider, MD  levalbuterol (XOPENEX) 1.25 MG/0.5ML nebulizer solution Take 1.25 mg by nebulization 4 (four) times daily as needed for wheezing or shortness of breath. 01/01/14  Yes Geradine Girt, DO  loperamide (IMODIUM) 2 MG capsule Take 2 mg by mouth as needed for diarrhea or loose stools (do not exceed 8mg  /24 hours).    Yes Historical Provider, MD  loratadine (CLARITIN) 10 MG tablet Take 10 mg by mouth every morning.    Yes Historical Provider, MD  magnesium hydroxide (MILK OF MAGNESIA) 400 MG/5ML suspension Take 30 mLs by mouth daily as needed for mild constipation.   Yes Historical Provider, MD  metoprolol tartrate (LOPRESSOR) 50 MG tablet Take 1 tablet (50 mg total) by mouth 2 (two) times daily. At 0800 and 1700. 05/28/15  Yes Velvet Bathe, MD  OxyCODONE (OXYCONTIN) 10 mg T12A 12 hr tablet Take one tablet by mouth every 12 hours PRN pain. Do not crush Patient taking differently: Take 10 mg by mouth every 12 (twelve) hours.  05/28/15  Yes Velvet Bathe, MD  OXYGEN Inhale 2 L  into the lungs continuous. Via nasal cannula.   Yes Historical Provider, MD  Polyethyl Glycol-Propyl Glycol (SYSTANE) 0.4-0.3 % SOLN Place 1 drop into both eyes 4 (four) times daily.   Yes Historical Provider, MD  polyethylene glycol (MIRALAX / GLYCOLAX) packet Take 17 g by mouth daily.    Yes Historical Provider, MD  polyvinyl alcohol (ARTIFICIAL TEARS) 1.4 % ophthalmic solution Place 1 drop into both eyes 4 (four) times daily as needed for dry eyes.   Yes Historical Provider, MD  polyvinyl alcohol (LIQUIFILM TEARS) 1.4 % ophthalmic solution Place 2 drops into both eyes 4 (four) times daily as needed for dry eyes.   Yes Historical Provider, MD  potassium chloride (K-DUR,KLOR-CON) 10 MEQ tablet Take 5 mEq by mouth every evening.  08/25/11  Yes Judithann Sheen, MD  QUEtiapine (SEROQUEL) 100 MG tablet Take 100 mg by mouth at bedtime. Give 1 tablet along with Seroquel 50mg  to = 150mg  at bedtime   Yes Historical Provider, MD   QUEtiapine (SEROQUEL) 50 MG tablet Take 50 mg by mouth at bedtime. Take along with Seroquel 100mg  to = 150mg  at bedtime.   Yes Historical Provider, MD  risperiDONE (RISPERDAL) 2 MG tablet Take 2 mg by mouth 2 (two) times daily.   Yes Historical Provider, MD  simvastatin (ZOCOR) 10 MG tablet Take 10 mg by mouth daily at 6 PM.    Yes Historical Provider, MD  sodium phosphate (FLEET) enema Place 1 enema rectally daily as needed (constipation). follow package directions   Yes Historical Provider, MD  tiotropium (SPIRIVA) 18 MCG inhalation capsule Place 1 capsule (18 mcg total) into inhaler and inhale daily. 08/25/11  Yes Judithann Sheen, MD  torsemide (DEMADEX) 20 MG tablet Take 40 mg by mouth daily with breakfast.    Yes Historical Provider, MD  traZODone (DESYREL) 150 MG tablet Take 150 mg by mouth at bedtime.   Yes Historical Provider, MD  venlafaxine XR (EFFEXOR-XR) 150 MG 24 hr capsule Take 300 mg by mouth every morning.  01/31/12  Yes Waldemar Dickens, MD   Allergies  Allergen Reactions  . Benzodiazepines Other (See Comments)    Mood dysregulation  (on MAR)  . Morphine And Related Other (See Comments)    Pain dysregulation Mood disruption  . Olanzapine     Hallucinations and disorientation  . Sulfamethoxazole-Trimethoprim Hives    FAMILY HISTORY:  Family History  Problem Relation Age of Onset  . Heart disease Father   . Heart attack Father   . Clotting disorder Father   . Stroke Mother     SOCIAL HISTORY:  reports that she quit smoking about 28 years ago. Her smoking use included Cigarettes. She smoked 1.00 pack per day. She has never used smokeless tobacco. She reports that she does not drink alcohol or use illicit drugs.  REVIEW OF SYSTEMS:  Unable to obtain as pt is encephalopathic.  SUBJECTIVE:   VITAL SIGNS: Temp:  [99.1 F (37.3 C)] 99.1 F (37.3 C) (08/11 2026) Pulse Rate:  [52-137] 87 (08/11 2245) Resp:  [12-26] 26 (08/11 2245) BP: (77-112)/(40-82) 99/54 mmHg  (08/11 2245) SpO2:  [95 %-100 %] 98 % (08/11 2245) FiO2 (%):  [60 %-100 %] 60 % (08/11 2228) Weight:  [92.2 kg (203 lb 4.2 oz)] 92.2 kg (203 lb 4.2 oz) (08/11 2230) HEMODYNAMICS:   VENTILATOR SETTINGS: Vent Mode:  [-] PRVC FiO2 (%):  [60 %-100 %] 60 % Set Rate:  [12 bmp-26 bmp] 26 bmp Vt Set:  [400 mL-500  mL] 400 mL PEEP:  [5 cmH20] 5 cmH20 Plateau Pressure:  [30 PRF16-38 cmH20] 38 cmH20 INTAKE / OUTPUT: Intake/Output      08/11 0701 - 08/12 0700   I.V. (mL/kg) 3800 (41.2)   Total Intake(mL/kg) 3800 (41.2)   Net +3800         PHYSICAL EXAMINATION: General: Adult female, non-responsive. Neuro: Non-responsive s/p cardiac arrest. HEENT: [place]/AT. PERRL, sclerae anicteric. Cardiovascular: RRR, no M/R/G.  Lungs: Respirations even and unlabored.  Expiratory wheeze bilaterally. Abdomen: BS x 4, soft, NT/ND.  Musculoskeletal: No gross deformities, no edema.  Skin: Intact, warm, no rashes.  LABS:  CBC  Recent Labs Lab 08/01/2015 2017 08/10/2015 2028  WBC 9.6  --   HGB 11.6* 12.9  HCT 38.0 38.0  PLT 229  --    Coag's No results for input(s): APTT, INR in the last 168 hours. BMET  Recent Labs Lab 08/09/2015 2017 07/23/2015 2028  NA 136 137  K 4.1 4.1  CL 96* 95*  CO2 24  --   BUN 11 13  CREATININE 0.95 0.90  GLUCOSE 256* 264*   Electrolytes  Recent Labs Lab 08/17/2015 2017  CALCIUM 8.3*   Sepsis Markers  Recent Labs Lab 07/28/2015 2028  LATICACIDVEN 5.61*   ABG  Recent Labs Lab 08/07/2015 2044 08/20/2015 2220  PHART 7.244* 7.227*  PCO2ART 65.0* 70.7*  PO2ART 131.0* 95.0   Liver Enzymes  Recent Labs Lab 07/22/2015 2017  AST 88*  ALT 53  ALKPHOS 48  BILITOT 0.4  ALBUMIN 3.4*   Cardiac Enzymes No results for input(s): TROPONINI, PROBNP in the last 168 hours. Glucose No results for input(s): GLUCAP in the last 168 hours.  Imaging Dg Chest Portable 1 View  08/11/2015   CLINICAL DATA:  74 year old female status post intubation.  EXAM: PORTABLE CHEST -  1 VIEW  COMPARISON:  Chest x-ray 06/04/2015.  FINDINGS: An endotracheal tube is in place with tip 1.0 cm above the carina. Nasogastric tube extends into the distal third of the esophagus. Lung volumes are low. There are some bibasilar opacities favored to reflect areas of subsegmental atelectasis. No acute consolidative airspace disease. No pleural effusions. No pneumothorax. No evidence of pulmonary edema. Heart size appears borderline enlarged. Upper mediastinal contours are distorted by patient's rotation to the right. Atherosclerosis in the thoracic aorta.  IMPRESSION: 1. Support apparatus, as above. Endotracheal tube is low, and should be withdrawn 2-3 cm for more optimal placement. Nasogastric to needs to be advanced into the stomach approximately 10 cm. 2. Low lung volumes with probable bibasilar subsegmental atelectasis. 3. Atherosclerosis.   Electronically Signed   By: Vinnie Langton M.D.   On: 07/30/2015 21:03     ASSESSMENT / PLAN:  PULMONARY OETT 8/11 >>> A: VDRF following PEA arrest 8/11. COPD with probable exacerbation. Atelectasis. 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 P:   Full mechanical support, wean as able. ABG noted - increase RR. VAP bundle. Hold SBT while paralyzed. DuoNebs / Albuterol. Solumedrol 40mg  q12hrs. Abx per ID section. Heparin gtt in lieu of outpatient eliquis. CXR in AM.  CARDIOVASCULAR CVL pending 8/11 >>> A:  S/p PEA arrest 8/11. Hypotension - suspect hypovolemia + possibly cardiogenic.  I suppose she could also have early urosepsis given her mild UTI. Hx HTN, HLD. P:  Goal MAP > 80 during cooling. Goal CVP 8 - 12. Trend troponin / lactate. TTE. Continue outpatient ASA, simvastatin. Hold outpatient diltiazem, metoprolol, torsemide.  RENAL A:   Hypocalcemia.  Anticipate multiple metabolic derangements during hypothermia protocol. P:   1g Ca gluconate. NS @ 125. Correct electrolytes as indicated. Frequent BMP's q2hrs x  4.  GASTROINTESTINAL A:   GI prophylaxis. Nutrition. P:   SUP: Pantoprazole. NPO.  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 VTE Prophylaxis. P:  SCD's / Heparin gtt. Hold outpatient eliquis. Coags q8hrs x 2. CBC in AM.  INFECTIOUS A:   UTI. P:   BCx2 8/11 > UCx 8/11 > Sputum Cx 8/11 > Abx: Ceftriaxone, start date 8/11, day 1/x. PCT algorithm to limit abx exposure.  ENDOCRINE A:   DM.   P:   ICU hyperglycemia protocol. Check TSH.  NEUROLOGIC A:   Acute metabolic encephalopathy. Concern for anoxic brain injury. Hx Bipolar disorder. P:   Sedation:  Cisatracurium gtt / Fentanyl gtt / Midazolam gtt. RASS goal: -5 during hypothermia phase. Hold WUA while paralyzed. Hold outpatient alprazolam, gabapentin, oxycodone, quetiapine, risperidone, trazodone, venlafaxine.   Family updated: 2 daughters and granddaughter at bedside.  Interdisciplinary Family Meeting v Palliative Care Meeting:  Due by: 08/06/15.  CC time:  40 minutes.   Montey Hora, [place] [facility] Pager: [phone]  or [phone] 08/13/2015, 10:52 PM

## 2015-07-31 NOTE — Progress Notes (Addendum)
Chaplain responded to page, family escorted to consultation room.  Chaplain informed staff and continuing care and support of family.    08/04/2015 2000  Clinical Encounter Type  Visited With Family;Health care provider  Visit Type Initial;Social support;ED  Referral From Nurse  Spiritual Encounters  Spiritual Needs Emotional  Stress Factors  Family Stress Factors Lack of knowledge   Geralyn Flash 08/20/2015 8:19 PM   Chaplain facilitated update with MD and eventually took family to bedside.  Chaplain later escorted family to [facility] waiting area and informed staff of family's presence.  Chaplain available for follow up as needed.  08/01/2015 3:18 AM

## 2015-07-31 NOTE — Progress Notes (Addendum)
ANTICOAGULATION CONSULT NOTE - Initial Consult  Pharmacy Consult for Heparin  Indication: chest pain/ACS  Allergies  Allergen Reactions  . Benzodiazepines Other (See Comments)    Mood dysregulation  (on MAR)  . Morphine And Related Other (See Comments)    Pain dysregulation Mood disruption  . Olanzapine     Hallucinations and disorientation  . Sulfamethoxazole-Trimethoprim Hives    Patient Measurements: Height: 5\' 2"  (157.5 cm) Weight: 203 lb 4.2 oz (92.2 kg) IBW/kg (Calculated) : 50.1 Heparin Dosing Weight: 72 kg   Vital Signs: Temp: 99.1 F (37.3 C) (08/11 2026) Temp Source: Rectal (08/11 2026) BP: 99/54 mmHg (08/11 2245) Pulse Rate: 87 (08/11 2245)  Labs:  Recent Labs  07/22/2015 2017 07/29/2015 2028  HGB 11.6* 12.9  HCT 38.0 38.0  PLT 229  --   CREATININE 0.95 0.90    Estimated Creatinine Clearance: 59.7 mL/min (by C-G formula based on Cr of 0.9).   Medical History: Past Medical History  Diagnosis Date  . Anxiety   . Arthritis   . COPD (chronic obstructive pulmonary disease)   . GERD (gastroesophageal reflux disease)   . Hyperlipidemia   . Osteoporosis   . DVT (deep venous thrombosis)   . PE (pulmonary thromboembolism)   . Hypertension      PT DENIES....ON NO  MEDS  . Diabetes mellitus   . Cancer 1969    cervical  . Pneumonia     h/o  . Bronchitis     h/o  . Blood transfusion   . Anemia   . Bipolar affect, depressed   . Acute thromboembolism of deep veins of lower extremity 10/02/2009    Qualifier: Diagnosis of  By: Martinique, Bonnie    . Septic shock 12/25/2013  . MRSA carrier 05/27/2015    Medications:   (Not in a hospital admission)  Assessment: 76 YOF with PMH of COPD, DVT, PE, and DM who presents today with cardiac arrest. Now in Afib with RVR s/p 12 minutes of CPR. Code cool has been initiated. Pharmacy consulted to start IV heparin for ACS. H/H and Plt wnl. Patient was on Apixaban prior to admission. Last dose was at 0800 today.   Goal  of Therapy:  Heparin level 0.3-0.7 units/ml Monitor platelets by anticoagulation protocol: Yes   Plan:  -Heparin 2000 units IV bolus followed by 550 units/hr given hypothermia protocol -F/u 6 hour HL/APTT  -Monitor daily HL/APTT, CBC and s/s of bleeding   Albertina Parr, PharmD., BCPS Clinical Pharmacist Pager [phone]  Addendum:  Now adding Zosyn for UTI with h/o resistant E.coli. Start Zosyn 3.375 gm IV Q 8 hours. Monitor cultures, WBC and clinical progress   Albertina Parr, PharmD., BCPS Clinical Pharmacist Pager [phone]

## 2015-07-31 NOTE — Progress Notes (Signed)
This encounter was created in error - please disregard.

## 2015-07-31 NOTE — ED Notes (Signed)
Patient converted from Afib RVR to sinus, rate in the 80's.

## 2015-07-31 NOTE — ED Notes (Signed)
Critical care MD at bedside 

## 2015-08-01 ENCOUNTER — Encounter (HOSPITAL_COMMUNITY): Payer: Self-pay

## 2015-08-01 ENCOUNTER — Inpatient Hospital Stay (HOSPITAL_COMMUNITY): Payer: Medicare Other

## 2015-08-01 DIAGNOSIS — G40301 Generalized idiopathic epilepsy and epileptic syndromes, not intractable, with status epilepticus: Secondary | ICD-10-CM

## 2015-08-01 DIAGNOSIS — J9601 Acute respiratory failure with hypoxia: Secondary | ICD-10-CM

## 2015-08-01 DIAGNOSIS — G931 Anoxic brain damage, not elsewhere classified: Secondary | ICD-10-CM

## 2015-08-01 DIAGNOSIS — I10 Essential (primary) hypertension: Secondary | ICD-10-CM

## 2015-08-01 DIAGNOSIS — I469 Cardiac arrest, cause unspecified: Secondary | ICD-10-CM

## 2015-08-01 DIAGNOSIS — L8990 Pressure ulcer of unspecified site, unspecified stage: Secondary | ICD-10-CM | POA: Insufficient documentation

## 2015-08-01 LAB — BASIC METABOLIC PANEL
ANION GAP: 7 (ref 5–15)
ANION GAP: 10 (ref 5–15)
Anion gap: 10 (ref 5–15)
Anion gap: 13 (ref 5–15)
Anion gap: 13 (ref 5–15)
Anion gap: 12 (ref 5–15)
BUN: 10 mg/dL (ref 6–20)
BUN: 9 mg/dL (ref 6–20)
BUN: 8 mg/dL (ref 6–20)
BUN: 7 mg/dL (ref 6–20)
BUN: 8 mg/dL (ref 6–20)
BUN: 7 mg/dL (ref 6–20)
CALCIUM: 7 mg/dL — AB (ref 8.9–10.3)
CALCIUM: 7.7 mg/dL — AB (ref 8.9–10.3)
CHLORIDE: 100 mmol/L — AB (ref 101–111)
CHLORIDE: 107 mmol/L (ref 101–111)
CO2: 27 mmol/L (ref 22–32)
CO2: 30 mmol/L (ref 22–32)
CO2: 26 mmol/L (ref 22–32)
CO2: 26 mmol/L (ref 22–32)
CO2: 24 mmol/L (ref 22–32)
CO2: 24 mmol/L (ref 22–32)
CREATININE: 0.72 mg/dL (ref 0.44–1.00)
CREATININE: 0.63 mg/dL (ref 0.44–1.00)
CREATININE: 0.69 mg/dL (ref 0.44–1.00)
Calcium: 7.5 mg/dL — ABNORMAL LOW (ref 8.9–10.3)
Calcium: 7.6 mg/dL — ABNORMAL LOW (ref 8.9–10.3)
Calcium: 7.7 mg/dL — ABNORMAL LOW (ref 8.9–10.3)
Calcium: 7.9 mg/dL — ABNORMAL LOW (ref 8.9–10.3)
Chloride: 101 mmol/L (ref 101–111)
Chloride: 100 mmol/L — ABNORMAL LOW (ref 101–111)
Chloride: 101 mmol/L (ref 101–111)
Chloride: 104 mmol/L (ref 101–111)
Creatinine, Ser: 0.66 mg/dL (ref 0.44–1.00)
Creatinine, Ser: 0.62 mg/dL (ref 0.44–1.00)
Creatinine, Ser: 0.48 mg/dL (ref 0.44–1.00)
GFR calc Af Amer: >60 mL/min (ref >60)
GFR calc Af Amer: >60 mL/min (ref >60)
GFR calc Af Amer: >60 mL/min (ref >60)
GFR calc non Af Amer: >60 mL/min (ref >60)
GFR calc non Af Amer: >60 mL/min (ref >60)
GLUCOSE: 215 mg/dL — AB (ref 65–99)
GLUCOSE: 206 mg/dL — AB (ref 65–99)
Glucose, Bld: 218 mg/dL — ABNORMAL HIGH (ref 65–99)
Glucose, Bld: 244 mg/dL — ABNORMAL HIGH (ref 65–99)
Glucose, Bld: 230 mg/dL — ABNORMAL HIGH (ref 65–99)
Glucose, Bld: 171 mg/dL — ABNORMAL HIGH (ref 65–99)
POTASSIUM: 3.2 mmol/L — AB (ref 3.5–5.1)
POTASSIUM: 3.3 mmol/L — AB (ref 3.5–5.1)
Potassium: 3.4 mmol/L — ABNORMAL LOW (ref 3.5–5.1)
Potassium: 3.4 mmol/L — ABNORMAL LOW (ref 3.5–5.1)
Potassium: 2.7 mmol/L — CL (ref 3.5–5.1)
Potassium: 3.1 mmol/L — ABNORMAL LOW (ref 3.5–5.1)
SODIUM: 140 mmol/L (ref 135–145)
Sodium: 138 mmol/L (ref 135–145)
Sodium: 137 mmol/L (ref 135–145)
Sodium: 139 mmol/L (ref 135–145)
Sodium: 140 mmol/L (ref 135–145)
Sodium: 141 mmol/L (ref 135–145)

## 2015-08-01 LAB — PROCALCITONIN: Procalcitonin: 0.23 ng/mL

## 2015-08-01 LAB — TROPONIN I
Troponin I: <0.03 ng/mL (ref <0.031)
Troponin I: <0.03 ng/mL (ref <0.031)

## 2015-08-01 LAB — GLUCOSE, CAPILLARY
GLUCOSE-CAPILLARY: 217 mg/dL — AB (ref 65–99)
GLUCOSE-CAPILLARY: 224 mg/dL — AB (ref 65–99)
GLUCOSE-CAPILLARY: 236 mg/dL — AB (ref 65–99)
GLUCOSE-CAPILLARY: 188 mg/dL — AB (ref 65–99)
GLUCOSE-CAPILLARY: 171 mg/dL — AB (ref 65–99)
GLUCOSE-CAPILLARY: 182 mg/dL — AB (ref 65–99)
GLUCOSE-CAPILLARY: 178 mg/dL — AB (ref 65–99)
GLUCOSE-CAPILLARY: 176 mg/dL — AB (ref 65–99)
Glucose-Capillary: 201 mg/dL — ABNORMAL HIGH (ref 65–99)
Glucose-Capillary: 217 mg/dL — ABNORMAL HIGH (ref 65–99)
Glucose-Capillary: 233 mg/dL — ABNORMAL HIGH (ref 65–99)
Glucose-Capillary: 237 mg/dL — ABNORMAL HIGH (ref 65–99)
Glucose-Capillary: 214 mg/dL — ABNORMAL HIGH (ref 65–99)
Glucose-Capillary: 124 mg/dL — ABNORMAL HIGH (ref 65–99)
Glucose-Capillary: 207 mg/dL — ABNORMAL HIGH (ref 65–99)
Glucose-Capillary: 132 mg/dL — ABNORMAL HIGH (ref 65–99)
Glucose-Capillary: 155 mg/dL — ABNORMAL HIGH (ref 65–99)

## 2015-08-01 LAB — APTT
APTT: 62 s — AB (ref 24–37)
aPTT: 51 seconds — ABNORMAL HIGH (ref 24–37)

## 2015-08-01 LAB — CBC
HEMATOCRIT: 39.1 % (ref 36.0–46.0)
Hemoglobin: 11.9 g/dL — ABNORMAL LOW (ref 12.0–15.0)
MCH: 28.1 pg (ref 26.0–34.0)
MCHC: 30.4 g/dL (ref 30.0–36.0)
MCV: 92.4 fL (ref 78.0–100.0)
Platelets: 186 10*3/uL (ref 150–400)
RBC: 4.23 MIL/uL (ref 3.87–5.11)
RDW: 14.6 % (ref 11.5–15.5)
WBC: 6.5 10*3/uL (ref 4.0–10.5)

## 2015-08-01 LAB — PROTIME-INR
INR: 1.3 (ref 0.00–1.49)
PROTHROMBIN TIME: 16.4 s — AB (ref 11.6–15.2)

## 2015-08-01 LAB — LIPASE, BLOOD: Lipase: 14 U/L — ABNORMAL LOW (ref 22–51)

## 2015-08-01 LAB — CORTISOL: Cortisol, Plasma: 26.7 ug/dL

## 2015-08-01 LAB — TSH: TSH: 0.517 u[IU]/mL (ref 0.350–4.500)

## 2015-08-01 LAB — HEPARIN LEVEL (UNFRACTIONATED)

## 2015-08-01 LAB — MRSA PCR SCREENING: MRSA by PCR: POSITIVE — AB

## 2015-08-01 LAB — LACTIC ACID, PLASMA
LACTIC ACID, VENOUS: 3.6 mmol/L — AB (ref 0.5–2.0)
Lactic Acid, Venous: 1.6 mmol/L (ref 0.5–2.0)
Lactic Acid, Venous: 1.3 mmol/L (ref 0.5–2.0)
Lactic Acid, Venous: 4.6 mmol/L (ref 0.5–2.0)

## 2015-08-01 LAB — CBG MONITORING, ED: GLUCOSE-CAPILLARY: 258 mg/dL — AB (ref 65–99)

## 2015-08-01 LAB — MAGNESIUM: Magnesium: 1.4 mg/dL — ABNORMAL LOW (ref 1.7–2.4)

## 2015-08-01 LAB — PHOSPHORUS: Phosphorus: 3.2 mg/dL (ref 2.5–4.6)

## 2015-08-01 MED ORDER — SODIUM CHLORIDE 0.9 % IV SOLN
200.0000 mg | Freq: Once | INTRAVENOUS | Status: AC
  Administered 2015-08-01: 200 mg via INTRAVENOUS
  Filled 2015-08-01: qty 20

## 2015-08-01 MED ORDER — POTASSIUM CHLORIDE 10 MEQ/50ML IV SOLN
10.0000 meq | INTRAVENOUS | Status: AC
  Administered 2015-08-01 (×6): 10 meq via INTRAVENOUS
  Filled 2015-08-01 (×6): qty 50

## 2015-08-01 MED ORDER — CHLORHEXIDINE GLUCONATE 0.12% ORAL RINSE (MEDLINE KIT)
15.0000 mL | Freq: Two times a day (BID) | OROMUCOSAL | Status: DC
  Administered 2015-08-01 – 2015-08-03 (×6): 15 mL via OROMUCOSAL

## 2015-08-01 MED ORDER — SODIUM CHLORIDE 0.9 % IV SOLN
500.0000 mg | Freq: Once | INTRAVENOUS | Status: AC
  Administered 2015-08-01: 500 mg via INTRAVENOUS
  Filled 2015-08-01: qty 5

## 2015-08-01 MED ORDER — SODIUM CHLORIDE 0.9 % IV SOLN
1000.0000 mg | Freq: Two times a day (BID) | INTRAVENOUS | Status: DC
  Administered 2015-08-01: 1000 mg via INTRAVENOUS
  Filled 2015-08-01 (×2): qty 10

## 2015-08-01 MED ORDER — METHYLPREDNISOLONE SODIUM SUCC 125 MG IJ SOLR
60.0000 mg | Freq: Four times a day (QID) | INTRAMUSCULAR | Status: DC
  Administered 2015-08-01 – 2015-08-04 (×13): 60 mg via INTRAVENOUS
  Filled 2015-08-01: qty 0.96
  Filled 2015-08-01: qty 2
  Filled 2015-08-01 (×2): qty 0.96
  Filled 2015-08-01: qty 2
  Filled 2015-08-01: qty 0.96
  Filled 2015-08-01: qty 2
  Filled 2015-08-01 (×3): qty 0.96
  Filled 2015-08-01: qty 2
  Filled 2015-08-01 (×5): qty 0.96

## 2015-08-01 MED ORDER — SODIUM CHLORIDE 0.9 % IV BOLUS (SEPSIS)
1000.0000 mL | Freq: Once | INTRAVENOUS | Status: AC
Start: 2015-08-01 — End: 2015-08-01
  Administered 2015-08-01: 1000 mL via INTRAVENOUS

## 2015-08-01 MED ORDER — PERFLUTREN LIPID MICROSPHERE
INTRAVENOUS | Status: AC
  Filled 2015-08-01: qty 10

## 2015-08-01 MED ORDER — HYDRALAZINE HCL 20 MG/ML IJ SOLN
10.0000 mg | INTRAMUSCULAR | Status: DC | PRN
  Administered 2015-08-01 – 2015-08-03 (×4): 10 mg via INTRAVENOUS
  Filled 2015-08-01 (×6): qty 1

## 2015-08-01 MED ORDER — PIPERACILLIN-TAZOBACTAM 3.375 G IVPB
3.3750 g | Freq: Three times a day (TID) | INTRAVENOUS | Status: DC
  Administered 2015-08-01 – 2015-08-04 (×10): 3.375 g via INTRAVENOUS
  Filled 2015-08-01 (×14): qty 50

## 2015-08-01 MED ORDER — ANTISEPTIC ORAL RINSE SOLUTION (CORINZ)
7.0000 mL | OROMUCOSAL | Status: DC
  Administered 2015-08-01 – 2015-08-04 (×28): 7 mL via OROMUCOSAL

## 2015-08-01 MED ORDER — HEPARIN (PORCINE) IN NACL 100-0.45 UNIT/ML-% IJ SOLN
750.0000 [IU]/h | INTRAMUSCULAR | Status: DC
  Administered 2015-08-02: 750 [IU]/h via INTRAVENOUS
  Filled 2015-08-01 (×2): qty 250

## 2015-08-01 MED ORDER — POTASSIUM CHLORIDE 10 MEQ/50ML IV SOLN
10.0000 meq | INTRAVENOUS | Status: AC
  Administered 2015-08-01 (×4): 10 meq via INTRAVENOUS
  Filled 2015-08-01 (×4): qty 50

## 2015-08-01 MED ORDER — MAGNESIUM SULFATE 2 GM/50ML IV SOLN
2.0000 g | Freq: Once | INTRAVENOUS | Status: AC
  Administered 2015-08-01: 2 g via INTRAVENOUS
  Filled 2015-08-01: qty 50

## 2015-08-01 MED ORDER — SODIUM CHLORIDE 0.9 % IV SOLN
INTRAVENOUS | Status: DC
  Administered 2015-08-01: 1.6 [IU]/h via INTRAVENOUS
  Filled 2015-08-01 (×2): qty 2.5

## 2015-08-01 MED ORDER — AMLODIPINE BESYLATE 10 MG PO TABS
10.0000 mg | ORAL_TABLET | Freq: Every day | ORAL | Status: DC
  Filled 2015-08-01: qty 1

## 2015-08-01 MED ORDER — ASPIRIN 300 MG RE SUPP
300.0000 mg | Freq: Every day | RECTAL | Status: DC
  Administered 2015-08-01 – 2015-08-03 (×3): 300 mg via RECTAL
  Filled 2015-08-01 (×5): qty 1

## 2015-08-01 MED ORDER — CHLORHEXIDINE GLUCONATE CLOTH 2 % EX PADS
6.0000 | MEDICATED_PAD | Freq: Every day | CUTANEOUS | Status: DC
  Administered 2015-08-01 – 2015-08-04 (×4): 6 via TOPICAL

## 2015-08-01 MED ORDER — POTASSIUM CHLORIDE 10 MEQ/50ML IV SOLN
10.0000 meq | INTRAVENOUS | Status: AC
  Administered 2015-08-01 – 2015-08-02 (×4): 10 meq via INTRAVENOUS
  Filled 2015-08-01 (×4): qty 50

## 2015-08-01 MED ORDER — MUPIROCIN 2 % EX OINT
1.0000 | TOPICAL_OINTMENT | Freq: Two times a day (BID) | CUTANEOUS | Status: DC
Start: 2015-08-01 — End: 2015-08-04
  Administered 2015-08-01 – 2015-08-04 (×7): 1 via NASAL
  Filled 2015-08-01: qty 22

## 2015-08-01 MED ORDER — AMLODIPINE 1 MG/ML ORAL SUSPENSION: 10.0000 mg | Freq: Every day | ORAL | Status: DC

## 2015-08-01 MED ORDER — ASPIRIN 300 MG RE SUPP
300.0000 mg | Freq: Once | RECTAL | Status: AC
  Administered 2015-08-01: 300 mg via RECTAL
  Filled 2015-08-01: qty 1

## 2015-08-01 MED ORDER — VANCOMYCIN HCL IN DEXTROSE 1-5 GM/200ML-% IV SOLN
1000.0000 mg | Freq: Two times a day (BID) | INTRAVENOUS | Status: DC
  Administered 2015-08-02 – 2015-08-04 (×6): 1000 mg via INTRAVENOUS
  Filled 2015-08-01 (×7): qty 200

## 2015-08-01 MED ORDER — SODIUM CHLORIDE 0.9 % IV SOLN
1750.0000 mg | Freq: Once | INTRAVENOUS | Status: AC
  Administered 2015-08-01: 1750 mg via INTRAVENOUS
  Filled 2015-08-01: qty 1750

## 2015-08-01 MED ORDER — SODIUM CHLORIDE 0.9 % IV SOLN
1500.0000 mg | Freq: Two times a day (BID) | INTRAVENOUS | Status: DC
  Administered 2015-08-01: 1500 mg via INTRAVENOUS
  Filled 2015-08-01 (×2): qty 15

## 2015-08-01 MED ORDER — LACOSAMIDE 200 MG/20ML IV SOLN
100.0000 mg | Freq: Two times a day (BID) | INTRAVENOUS | Status: DC
  Administered 2015-08-01 (×2): 100 mg via INTRAVENOUS
  Filled 2015-08-01 (×4): qty 10

## 2015-08-01 NOTE — Progress Notes (Signed)
[place] Progress Note Patient Name: ALEC MCPHEE DOB: 1941/02/28 MRN: [id number]   Date of Service  08/01/2015  HPI/Events of Note  Called d/t lactic acid = 3.6. BP = 159/62 with MAP = 100. Last CVP = 3.  eICU Interventions  Will bolus with 0.9 NaCl 1 liter IV over 1 hour now.      Intervention Category Major Interventions: Acid-Base disturbance - evaluation and management;Shock - evaluation and management;Sepsis - evaluation and management  Lysle Dingwall 08/01/2015, 7:19 PM

## 2015-08-01 NOTE — Progress Notes (Signed)
ANTIBIOTIC CONSULT NOTE - INITIAL  Pharmacy Consult for zosyn/vancomycin Indication: PNA  Allergies  Allergen Reactions  . Benzodiazepines Other (See Comments)    Mood dysregulation  (on MAR)  . Morphine And Related Other (See Comments)    Pain dysregulation Mood disruption  . Olanzapine     Hallucinations and disorientation  . Sulfamethoxazole-Trimethoprim Hives    Patient Measurements: Height: 5\' 2"  (157.5 cm) Weight: 196 lb 3.4 oz (89 kg) IBW/kg (Calculated) : 50.1   Vital Signs: Temp: 91.2 F (32.9 C) (08/12 1000) Temp Source: Core (Comment) (08/12 1000) BP: 184/83 mmHg (08/12 0837) Pulse Rate: 99 (08/12 0900) Intake/Output from previous day: 08/11 0701 - 08/12 0700 In: 4411.6 [I.V.:4051.6; IV Piggyback:360] Out: 2700 [Urine:2700] Intake/Output from this shift: Total I/O In: -  Out: 545 [Urine:545]  Labs:  Recent Labs  08/13/2015 2017 08/14/2015 2028  08/01/15 0100 08/01/15 0435 08/01/15 0443 08/01/15 0700  WBC 9.6  --   --   --  6.5  --   --   HGB 11.6* 12.9  --   --  11.9*  --   --   PLT 229  --   --   --  186  --   --   CREATININE 0.95 0.90  < > 0.63  --  0.69 0.66  < > = values in this interval not displayed. Estimated Creatinine Clearance: 65.9 mL/min (by C-G formula based on Cr of 0.66). No results for input(s): VANCOTROUGH, VANCOPEAK, VANCORANDOM, GENTTROUGH, GENTPEAK, GENTRANDOM, TOBRATROUGH, TOBRAPEAK, TOBRARND, AMIKACINPEAK, AMIKACINTROU, AMIKACIN in the last 72 hours.   Microbiology: Recent Results (from the past 720 hour(s))  MRSA PCR Screening     Status: Abnormal   Collection Time: 08/01/15  1:51 AM  Result Value Ref Range Status   MRSA by PCR POSITIVE (A) NEGATIVE Final    Comment:        The GeneXpert MRSA Assay (FDA approved for NASAL specimens only), is one component of a comprehensive MRSA colonization surveillance program. It is not intended to diagnose MRSA infection nor to guide or monitor treatment for MRSA  infections. RESULT CALLED TO, READ BACK BY AND VERIFIED WITH: POTTER,L RN [phone] PEELE,K SKEEN,P     Medical History: Past Medical History  Diagnosis Date  . Anxiety   . Arthritis   . COPD (chronic obstructive pulmonary disease)   . GERD (gastroesophageal reflux disease)   . Hyperlipidemia   . Osteoporosis   . DVT (deep venous thrombosis)   . PE (pulmonary thromboembolism)   . Hypertension      PT DENIES....ON NO  MEDS  . Diabetes mellitus   . Cancer 1969    cervical  . Pneumonia     h/o  . Bronchitis     h/o  . Blood transfusion   . Anemia   . Bipolar affect, depressed   . Acute thromboembolism of deep veins of lower extremity 10/02/2009    Qualifier: Diagnosis of  By: Martinique, Bonnie    . Septic shock 12/25/2013  . MRSA carrier 05/27/2015     Assessment: 74 yo female s/p cardiac arrest on hypothermia protocol. She is noted with UTI and possible PNA to begin zosyn vancomycin. She has a previous UTI with e coli (cultures 05/25/15: resistant to rocephin, sens to zosyn, imipenem). WBC= 6.5, afeb, SCr= 0.66 and CrCl ~ 65.  8/11 rocephin>> 8/12 8/12 vanc>> 8/12 zosyn>>  8/12 resp 8/12 urine  Goal of Therapy:  Vancomycin trough level 15-20 mcg/ml  Plan:  -Vancomycin  1750mg  IV x1 followed by 1000mg  IV q12h -Zosyn 3.375gm IV 8hr -Will follow renal function, cultures and clinical progress  Hildred Laser, Pharm D 08/01/2015 11:10 AM

## 2015-08-01 NOTE — Progress Notes (Signed)
Transported patient to CT and 2H with no complications. RT will continue to monitor.

## 2015-08-01 NOTE — Consult Note (Addendum)
NEURO HOSPITALIST CONSULT NOTE   Referring physician: PCCM   Reason for Consult: Prognosis  HPI:                                                                                                                                          [name] is an 74 y.o. 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 She was brought to [facility] ED 8/11 after nursing home staff found her in her room and with purple skin tone. She had no pulses when found and CPR was performed immediately for roughly 20 minutes total. She received epi x 3 and was intubated in the field. Initial rhythm on EMS arrival was PEA. After ROSC, she had A.fib with RVR (HR in 180's), this spontaneously resolved after arrival in ED. Patient placed on hypothermic protocol and 13 hours into the protocol with current temperature of 91.2.   AST 88 ALT 53 Glucose 256 UA shows leukocytes CT 8/11  Shows no edema or loss of gray white matter.    Past Medical History  Diagnosis Date  . Anxiety   . Arthritis   . COPD (chronic obstructive pulmonary disease)   . GERD (gastroesophageal reflux disease)   . Hyperlipidemia   . Osteoporosis   . DVT (deep venous thrombosis)   . PE (pulmonary thromboembolism)   . Hypertension      PT DENIES....ON NO  MEDS  . Diabetes mellitus   . Cancer 1969    cervical  . Pneumonia     h/o  . Bronchitis     h/o  . Blood transfusion   . Anemia   . Bipolar affect, depressed   . Acute thromboembolism of deep veins of lower extremity 10/02/2009    Qualifier: Diagnosis of  By: Martinique, Bonnie    . Septic shock 12/25/2013  . 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 Vena cava filter placement    . 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 Eye surgery      CATARACTS  . Achilles tendon lengthening  11/18/11    and repair w/posterior tibial tendon lengthening; right  foot  .  Cholecystectomy    . Abdominal hysterectomy  1969    "womb taken out for cervical cancer"  . Cataract extraction, bilateral  ~ 2008  . Tonsillectomy      "as a child"  . Achilles tendon surgery  11/18/2011    Procedure: ACHILLES TENDON REPAIR;  Surgeon: Wylene Simmer, MD;  Location: [facility];  Service: Orthopedics;  Laterality: Right;  Right Posterior Tibial Tendon Lenghtening and Tendon Achilles Lenghtening     Family History  Problem Relation Age of Onset  . Heart disease Father   . Heart attack Father   . Clotting disorder Father   . Stroke Mother     Social History:  reports that she quit smoking about 28 years ago. Her smoking use included Cigarettes. She smoked 1.00 pack per day. She has never used smokeless tobacco. She reports that she does not drink alcohol or use illicit drugs.  Allergies  Allergen Reactions  . Benzodiazepines Other (See Comments)    Mood dysregulation  (on MAR)  . Morphine And Related Other (See Comments)    Pain dysregulation Mood disruption  . Olanzapine     Hallucinations and disorientation  . Sulfamethoxazole-Trimethoprim Hives    MEDICATIONS:                                                                                                                     Prior to Admission:  Prescriptions prior to admission  Medication Sig Dispense Refill Last Dose  . acetaminophen (TYLENOL) 325 MG tablet Take 650 mg by mouth every 4 (four) hours as needed for mild pain.   unknown  . ALPRAZolam (XANAX) 1 MG tablet Take 1 tablet (1 mg total) by mouth 4 (four) times daily. Take at 0600, 1000, 1400, and 1800. (Patient taking differently: Take 1 mg by mouth 4 (four) times daily. 0600, 1000, 1400, 1600) 30 tablet 0 07/24/2015 at Unknown time  . alum & mag hydroxide-simeth (MAALOX/MYLANTA) 200-200-20 MG/5ML suspension Take 30 mLs by mouth every 4 (four) hours as needed for indigestion.   unknown  . antiseptic oral rinse (BIOTENE) LIQD 15 mLs by Mouth Rinse route every 4  (four) hours as needed for dry mouth.   unknown  . apixaban (ELIQUIS) 5 MG TABS tablet Take 2 tablets (10 mg total) by mouth 2 (two) times daily. 30 tablet 0 08/08/2015 at 0800  . Artificial Tear Ointment (REFRESH LACRI-LUBE) OINT Place 1 application into both eyes at bedtime.   07/30/2015 at Unknown time  . aspirin 81 MG chewable tablet Chew 81 mg by mouth daily with breakfast.    07/22/2015 at 0800  . benzocaine-menthol (CHLORASEPTIC) 6-10 MG lozenge Take 1 lozenge by mouth every 2 (two) hours as needed for sore throat.   unknown  . bisacodyl (DULCOLAX) 10 MG suppository Place 10 mg rectally as directed. insert three times a week- Monday, Wednesday and Friday, and if no bowel movement place 1 every 2 days   07/30/2015 at Unknown time  . Cranberry-Vitamin C-Inulin (UTI-STAT) LIQD Take 30 mLs by mouth every morning.    unknown  . diltiazem (CARDIZEM CD) 120 MG 24 hr capsule Take 1 capsule (120 mg total) by mouth daily. 30 capsule 0 08/02/2015 at Unknown time  . esomeprazole (NEXIUM) 40 MG capsule Take 40 mg by mouth daily before breakfast.    07/30/2015 at Unknown time  . fenofibrate (TRICOR) 145 MG tablet Take 145 mg by mouth daily.   08/14/2015 at Unknown time  . fish oil-omega-3 fatty  acids 1000 MG capsule Take 1 g by mouth 2 (two) times daily.    08/03/2015 at Unknown time  . Fluticasone-Salmeterol (ADVAIR DISKUS) 250-50 MCG/DOSE AEPB Inhale 1 puff into the lungs 2 (two) times daily. 60 each 11 07/27/2015 at Unknown time  . gabapentin (NEURONTIN) 300 MG capsule Take 3 capsules (900 mg total) by mouth 3 (three) times daily.   07/30/2015 at Unknown time  . GLUCERNA (GLUCERNA) LIQD Take 1 Can by mouth 2 (two) times daily between meals.   unknown  . HYDROcodone-acetaminophen (NORCO/VICODIN) 5-325 MG per tablet Take 1 tablet by mouth every 4 (four) hours as needed for moderate pain.   unknown  . insulin aspart (NOVOLOG) 100 UNIT/ML FlexPen Inject 3-5 Units into the skin 3 (three) times daily before meals. Give 3  units before meals with an additional 5 units for CBG> or < 150   08/11/2015 at Unknown time  . insulin glargine (LANTUS) 100 UNIT/ML injection Inject 20-22 Units into the skin 2 (two) times daily. Takes 20 units in the morning and 22 units at bedtime   07/30/2015 at Unknown time  . levalbuterol (XOPENEX HFA) 45 MCG/ACT inhaler Inhale 2 puffs into the lungs every 4 (four) hours as needed for wheezing.    unknown  . levalbuterol (XOPENEX) 1.25 MG/0.5ML nebulizer solution Take 1.25 mg by nebulization 4 (four) times daily as needed for wheezing or shortness of breath.   unknown  . loperamide (IMODIUM) 2 MG capsule Take 2 mg by mouth as needed for diarrhea or loose stools (do not exceed 8mg  /24 hours).    unknown  . loratadine (CLARITIN) 10 MG tablet Take 10 mg by mouth every morning.    08/06/2015 at Unknown time  . magnesium hydroxide (MILK OF MAGNESIA) 400 MG/5ML suspension Take 30 mLs by mouth daily as needed for mild constipation.   unknown  . metoprolol tartrate (LOPRESSOR) 50 MG tablet Take 1 tablet (50 mg total) by mouth 2 (two) times daily. At 0800 and 1700. 60 tablet 0 08/04/2015 at 1700  . OxyCODONE (OXYCONTIN) 10 mg T12A 12 hr tablet Take one tablet by mouth every 12 hours PRN pain. Do not crush (Patient taking differently: Take 10 mg by mouth every 12 (twelve) hours. ) 60 tablet 0 08/19/2015 at 1800  . OXYGEN Inhale 2 L into the lungs continuous. Via nasal cannula.   continuous  . Polyethyl Glycol-Propyl Glycol (SYSTANE) 0.4-0.3 % SOLN Place 1 drop into both eyes 4 (four) times daily.   unknown  . polyethylene glycol (MIRALAX / GLYCOLAX) packet Take 17 g by mouth daily.    unknown  . polyvinyl alcohol (ARTIFICIAL TEARS) 1.4 % ophthalmic solution Place 1 drop into both eyes 4 (four) times daily as needed for dry eyes.   07/30/2015 at Unknown time  . polyvinyl alcohol (LIQUIFILM TEARS) 1.4 % ophthalmic solution Place 2 drops into both eyes 4 (four) times daily as needed for dry eyes.   unknown  .  potassium chloride (K-DUR,KLOR-CON) 10 MEQ tablet Take 5 mEq by mouth every evening.    08/02/2015 at Unknown time  . QUEtiapine (SEROQUEL) 100 MG tablet Take 100 mg by mouth at bedtime. Give 1 tablet along with Seroquel 50mg  to = 150mg  at bedtime   07/30/2015 at Unknown time  . QUEtiapine (SEROQUEL) 50 MG tablet Take 50 mg by mouth at bedtime. Take along with Seroquel 100mg  to = 150mg  at bedtime.   07/30/2015 at Unknown time  . risperiDONE (RISPERDAL) 2 MG tablet Take 2  mg by mouth 2 (two) times daily.   07/27/2015 at Unknown time  . simvastatin (ZOCOR) 10 MG tablet Take 10 mg by mouth daily at 6 PM.    08/01/2015 at Unknown time  . sodium phosphate (FLEET) enema Place 1 enema rectally daily as needed (constipation). follow package directions   unknown  . tiotropium (SPIRIVA) 18 MCG inhalation capsule Place 1 capsule (18 mcg total) into inhaler and inhale daily. 30 capsule 11 08/18/2015 at Unknown time  . torsemide (DEMADEX) 20 MG tablet Take 40 mg by mouth daily with breakfast.    08/06/2015 at Unknown time  . traZODone (DESYREL) 150 MG tablet Take 150 mg by mouth at bedtime.   07/30/2015 at Unknown time  . venlafaxine XR (EFFEXOR-XR) 150 MG 24 hr capsule Take 300 mg by mouth every morning.    08/13/2015 at Unknown time   Scheduled: . amLODipine  10 mg Per Tube Daily  . antiseptic oral rinse  7 mL Mouth Rinse Q2H while awake  . artificial tears  1 application Both Eyes 3 times per day  . aspirin  81 mg Oral Daily  . chlorhexidine gluconate  15 mL Mouth Rinse BID  . Chlorhexidine Gluconate Cloth  6 each Topical Q0600  . ipratropium-albuterol  3 mL Nebulization Q6H  . levETIRAcetam  1,000 mg Intravenous Q12H  . methylPREDNISolone (SOLU-MEDROL) injection  60 mg Intravenous Q6H  . mupirocin ointment  1 application Nasal BID  . pantoprazole (PROTONIX) IV  40 mg Intravenous QHS  . piperacillin-tazobactam (ZOSYN)  IV  3.375 g Intravenous Q8H  . simvastatin  10 mg Per Tube q1800   Continuous: .  cisatracurium (NIMBEX) infusion 1.5 mcg/kg/min (08/01/15 0724)  . fentaNYL infusion INTRAVENOUS 300 mcg/hr (08/01/15 0700)  . heparin 700 Units/hr (08/01/15 0852)  . insulin (NOVOLIN-R) infusion 3.6 Units/hr (08/01/15 1021)  . midazolam (VERSED) infusion 6 mg/hr (08/01/15 0700)  . norepinephrine (LEVOPHED) Adult infusion       ROS:                                                                                                                                       History obtained from unobtainable from patient due to mental status    Blood pressure 184/83, pulse 99, temperature 91.2 F (32.9 C), temperature source Core (Comment), resp. rate 24, height 5\' 2"  (1.575 m), weight 89 kg (196 lb 3.4 oz), SpO2 100 %.   Neurologic Examination:  HEENT-  Normocephalic, no lesions, without obvious abnormality.  Normal external eye and conjunctiva.  Normal TM's bilaterally.  Normal auditory canals and external ears. Normal external nose, mucus membranes and septum.  Normal pharynx. Cardiovascular- S1, S2 normal, pulses palpable throughout   Lungs- chest clear, no wheezing, rales, normal symmetric air entry Abdomen- normal findings: bowel sounds normal Extremities- no edema Lymph-no adenopathy palpable Musculoskeletal-no joint tenderness, deformity or swelling Skin-warm and dry, no hyperpigmentation, vitiligo, or suspicious lesions  Neurological Examination Mental Status:--patient is on Fentanyl, Versed and Nymbix Patient does not respond to verbal stimuli.  Does not respond to deep sternal rub.  Does not follow commands.  No verbalizations noted.  Cranial Nerves: II: patient does not respond confrontation bilaterally, pupils right 2 mm, left 2 mm,and reactive bilaterally III,IV,VI: doll's response absent bilaterally.  V,VII: corneal reflex absent bilaterally  VIII: patient does not respond to  verbal stimuli IX,X: gag reflex absent, XI: trapezius strength unable to test bilaterally XII: tongue strength unable to test Motor: Extremities flaccid throughout.  No spontaneous movement noted.  No purposeful movements noted. Sensory: Does not respond to noxious stimuli in any extremity. Deep Tendon Reflexes:  Absent throughout. Plantars: absent bilaterally Cerebellar: Unable to perform      Lab Results: Basic Metabolic Panel:  Recent Labs Lab 08/07/2015 2017 08/17/2015 2028 08/09/2015 2258 08/01/15 0100 08/01/15 0435 08/01/15 0443 08/01/15 0700  NA 136 137 138 137  --  140 139  K 4.1 4.1 3.4* 3.2*  --  3.4* 3.3*  CL 96* 95* 101 100*  --  101 100*  CO2 24  --  27 30  --  26 26  GLUCOSE 256* 264* 215* 218*  --  244* 230*  BUN 11 13 10 9   --  8 7  CREATININE 0.95 0.90 0.72 0.63  --  0.69 0.66  CALCIUM 8.3*  --  7.5* 7.0*  --  7.6* 7.7*  MG  --   --   --   --  1.4*  --   --   PHOS  --   --   --   --  3.2  --   --     Liver Function Tests:  Recent Labs Lab 08/07/2015 2017  AST 88*  ALT 53  ALKPHOS 48  BILITOT 0.4  PROT 6.9  ALBUMIN 3.4*   No results for input(s): LIPASE, AMYLASE in the last 168 hours. No results for input(s): AMMONIA in the last 168 hours.  CBC:  Recent Labs Lab 08/19/2015 2017 07/25/2015 2028 08/01/15 0435  WBC 9.6  --  6.5  NEUTROABS 6.2  --   --   HGB 11.6* 12.9 11.9*  HCT 38.0 38.0 39.1  MCV 94.3  --  92.4  PLT 229  --  186    Cardiac Enzymes:  Recent Labs Lab 08/04/2015 2258 08/01/15 0435  TROPONINI <0.03 <0.03    Lipid Panel: No results for input(s): CHOL, TRIG, HDL, CHOLHDL, VLDL, LDLCALC in the last 168 hours.  CBG:  Recent Labs Lab 08/01/15 0139 08/01/15 0211 08/01/15 0247 08/01/15 0403 08/01/15 0500  GLUCAP 201* 217* 217* 30* 33*    Microbiology: Results for orders placed or performed during the hospital encounter of 07/30/2015  MRSA PCR Screening     Status: Abnormal   Collection Time: 08/01/15  1:51 AM   Result Value Ref Range Status   MRSA by PCR POSITIVE (A) NEGATIVE Final    Comment:        The GeneXpert  MRSA Assay (FDA approved for NASAL specimens only), is one component of a comprehensive MRSA colonization surveillance program. It is not intended to diagnose MRSA infection nor to guide or monitor treatment for MRSA infections. RESULT CALLED TO, READ BACK BY AND VERIFIED WITH: POTTER,L RN [phone] PEELE,K SKEEN,P     Coagulation Studies:  Recent Labs  08/13/2015 2258  LABPROT 19.4*  INR 1.64*    Imaging: Ct Head Wo Contrast  08/01/2015   CLINICAL DATA:  Cardiac arrest with altered mental status  EXAM: CT HEAD WITHOUT CONTRAST  TECHNIQUE: Contiguous axial images were obtained from the base of the skull through the vertex without intravenous contrast.  COMPARISON:  May 20, 2009  FINDINGS: Mild diffuse atrophy is stable. There is no intracranial mass, hemorrhage, extra-axial fluid collection, or midline shift. There is no demonstrable loss of gray-white differentiation. There is patchy small vessel disease in the centra semiovale bilaterally, stable. No acute infarct evident. A small amount of basal ganglia calcification may be physiologic.  Bony calvarium appears intact. The mastoid air cells are clear. There are air-fluid levels in both maxillary antra. There is diffuse opacification of the naris bilaterally. There is an air-fluid level in the sphenoid sinus region as well as diffuse opacification in the ethmoid air cells. There are small air-fluid levels in each frontal sinus. There is evidence of a prior fracture of the left nasal bone.  IMPRESSION: Atrophy with periventricular small vessel disease, stable. No intracranial mass, hemorrhage, or extra-axial fluid collection. No edema or loss of gray-white differentiation. Evidence of extensive sinusitis as well as diffuse nasal turbinate edema. This appearance suggests recent intubation.   Electronically Signed   By: Lowella Grip III M.D.   On: 08/01/2015 00:30   Dg Chest Port 1 View  08/01/2015   CLINICAL DATA:  Respiratory failure.  EXAM: PORTABLE CHEST - 1 VIEW  COMPARISON:  08/01/2015.  FINDINGS: Endotracheal tube tip 1.8 cm above the carina. Left IJ line in stable position. Heart size stable. Persistent patchy bilateral pulmonary infiltrates noted consistent pneumonia. Small bilateral effusions. No pneumothorax.  IMPRESSION: 1. Endotracheal tube tip 1.8 cm above the carina. Left subclavian central line stable position. 2. Persistent patchy bilateral pulmonary infiltrates consistent with multifocal pneumonia. Small bilateral pleural effusions are present.   Electronically Signed   By: Marcello Moores  Register   On: 08/01/2015 07:25   Dg Chest Port 1 View  08/01/2015   CLINICAL DATA:  Central line placement.  Initial encounter.  EXAM: PORTABLE CHEST - 1 VIEW  COMPARISON:  Chest radiograph performed 08/12/2015  FINDINGS: [name] left subclavian line is noted ending about the distal SVC.  [name] endotracheal tube appears to end overlying the right mainstem bronchus, 1-2 cm below the carina. This should be retracted 4 cm.  Vascular congestion is noted. Patchy bilateral airspace opacities may reflect mild pneumonia or possibly mild interstitial edema. No pleural effusion or pneumothorax is seen.  The cardiomediastinal silhouette is mildly enlarged. No acute osseous abnormalities are identified.  IMPRESSION: 1. Left subclavian line noted ending about the distal SVC. 2. Endotracheal tube ends overlying the right mainstem bronchus, 1-2 cm below the carina. This should be retracted 4 cm. 3. Vascular congestion and mild cardiomegaly. Patchy bilateral airspace opacities may reflect mild pneumonia or possibly mild interstitial edema. These results were called by telephone at the time of interpretation on 08/01/2015 at 1:27 am to Nursing on [facility], who verbally acknowledged these results.   Electronically Signed   By: Jacqulynn Cadet  Chang M.D.   On: 08/01/2015 01:27   Dg Chest Portable 1 View  07/27/2015   CLINICAL DATA:  74 year old female status post intubation.  EXAM: PORTABLE CHEST - 1 VIEW  COMPARISON:  Chest x-ray 06/04/2015.  FINDINGS: An endotracheal tube is in place with tip 1.0 cm above the carina. Nasogastric tube extends into the distal third of the esophagus. Lung volumes are low. There are some bibasilar opacities favored to reflect areas of subsegmental atelectasis. No acute consolidative airspace disease. No pleural effusions. No pneumothorax. No evidence of pulmonary edema. Heart size appears borderline enlarged. Upper mediastinal contours are distorted by patient's rotation to the right. Atherosclerosis in the thoracic aorta.  IMPRESSION: 1. Support apparatus, as above. Endotracheal tube is low, and should be withdrawn 2-3 cm for more optimal placement. Nasogastric to needs to be advanced into the stomach approximately 10 cm. 2. Low lung volumes with probable bibasilar subsegmental atelectasis. 3. Atherosclerosis.   Electronically Signed   By: Vinnie Langton M.D.   On: 08/03/2015 21:03    Etta Quill PA-C [facility] [phone]  08/01/2015, 10:31 AM   Assessment/Plan: 74 YO female S/P PEA arrest with down time ~20 minutes.  EEG was obtained showing patient was in Status. Keppra 1000 mg BID with first dose STAT has been ordered--patient is allergic to benzo's. Currently being hooked up to LTM EEG.   Patient will be reassessed following completion of hypothermia protocol. Prognosis is indeterminate at this point.   Recommend: 1) LTM  2) Will adjust AED per findings on LTM  3) Repeat CT head in 48 hours to evaluate for hypoxic injury  We will continue to follow this patient with you.  This patient is critically ill and at significant risk of neurological worsening or death, and care requires constant monitoring of vital signs, hemodynamics,respiratory and cardiac monitoring, neurological  assessment, discussion with family, other specialists and medical decision making of high complexity. Total critical care time was 50 minutes.  I personally participated in this patient's evaluation and management, including examination, as well as formulating the above clinical impression and management recommendations.  Rush Farmer M.D. [facility] [phone]

## 2015-08-01 NOTE — Progress Notes (Signed)
LTM day 1 started; pt head glued; impedances under 5 kohms.

## 2015-08-01 NOTE — Procedures (Signed)
Central Venous Catheter Insertion Procedure Note [name] [id number] 1941-06-01  Procedure: Insertion of Central Venous Catheter Indications: Assessment of intravascular volume, Drug and/or fluid administration and Frequent blood sampling  Procedure Details Consent: Risks of procedure as well as the alternatives and risks of each were explained to the (patient/caregiver).  Consent for procedure obtained. Time Out: Verified patient identification, verified procedure, site/side was marked, verified correct patient position, special equipment/implants available, medications/allergies/relevent history reviewed, required imaging and test results available.  Performed  Maximum sterile technique was used including antiseptics, cap, gloves, gown, hand hygiene, mask and sheet. Skin prep: Chlorhexidine; local anesthetic administered A antimicrobial bonded/coated triple lumen catheter was placed in the left subclavian vein using the Seldinger technique.  Evaluation Blood flow good Complications: No apparent complications Patient did tolerate procedure well. Chest X-ray ordered to verify placement.  CXR: pending.  Procedure performed under direct ultrasound guidance for real time vessel cannulation.      Montey Hora, [place] Pager: [phone]  or [phone] 08/01/2015, 12:50 AM

## 2015-08-01 NOTE — Progress Notes (Signed)
Critical lab result of lactic acid of 3.6 reported from [facility] to this eRN.  This eRN then reported this result to Dr. Oletta Darter eMD.

## 2015-08-01 NOTE — Progress Notes (Addendum)
CRITICAL VALUE ALERT  Critical value received: Lactic acid 3.6  Date of notification:  08/01/15  Time of notification:  1900  Critical value read back:Yes.    Nurse who received alert:  Marta Lamas RN  MD notified (1st page):  Polo Riley RN for Dr. Oletta Darter  Time of first page:  83  MD notified (2nd page):  Time of second page:  Responding MD:  Polo Riley RN for Dr. Oletta Darter  Time MD responded:  [phone]

## 2015-08-01 NOTE — Progress Notes (Signed)
PULMONARY / CRITICAL CARE MEDICINE   Name: CAIDYN BLOSSOM MRN: [id number] DOB: 1941/01/03    ADMISSION DATE:  08/06/2015 CONSULTATION DATE:  08/01/2015  REFERRING MD :  EDP  CHIEF COMPLAINT:  Cardiac Arrest  INITIAL PRESENTATION:  74 y.o. F brought to [facility] ED 8/11 after she suffered a 20 min PEA arrest with unclear downtime.  Patient had complained of N/V earlier in day, and has current UTI treating with Rocephin but culture resistantly found to be resistant. PCCM called for admission, patient started on hypothermia.   STUDIES:  Echo 05/26/2015 >> LVEF 65-70%, mild LVH CXR 8/11 >>> Low volumes with bibasilar subsegmental atelectasis. CT head 8/11 >>> Negative. Stable small vessel atrophy, no mass, hemorrhage, edema  SIGNIFICANT EVENTS: 8/11 - 20 minute PEA arrest, admitted by PCCM, hypothermia started   LINES/DRAINS: R radial A line 8/12 >> L [place] CVL 8/12 >>  BRIEF PATIENT DESCRIPTOR: History obtained from chart review, patient encephalopathic on admission, and currently paralyzed  MYLEAH CAVENDISH is a 74 y.o. 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  She was brought to [facility] ED 8/11 after nursing home staff found her in her room and with purple skin tone.  She had no pulses when found and CPR was performed immediately for roughly 20 minutes total.  She received epi x 3 and was intubated in the field.  Initial rhythm on EMS arrival was PEA.  After ROSC, she had A.fib with RVR (HR in 180's), this spontaneously resolved after arrival in ED.  Per family, pt had called one of her daughters earlier in the afternoon complaining of nausea and vomiting.  As far as daughter knows, pt did not eat dinner since she had vomiting after lunch.  From what her daughter knows, pt went to take a nap this afternoon around 4 or 5 and except for the vomiting, she was in her USOH.  She has not had any recent fevers/chills/sweats, headaches, chest pain, SOB,  N/V/D, abdominal pain, myalgias.   In ED, she remained unresponsive and had no sedation.  Family at bedside states that she has been hospitalized in the past for sepsis and that on prior conversations regarding advanced directives, pt indicated that she would like to remain full code.   SUBJECTIVE: Sedated, paralyzed, on hypothermia. No family at bedside. No acute events reported since admission.   VITAL SIGNS: Temp:  [90.7 F (32.6 C)-99.1 F (37.3 C)] 91.8 F (33.2 C) (08/12 0700) Pulse Rate:  [52-137] 78 (08/12 0746) Resp:  [12-26] 24 (08/12 0746) BP: (77-188)/(40-103) 184/83 mmHg (08/12 0837) SpO2:  [94 %-100 %] 100 % (08/12 0746) Arterial Line BP: (173-194)/(65-111) 179/71 mmHg (08/12 0600) FiO2 (%):  [60 %-100 %] 90 % (08/12 0746) Weight:  [196 lb 3.4 oz (89 kg)-203 lb 4.2 oz (92.2 kg)] 196 lb 3.4 oz (89 kg) (08/12 0100) HEMODYNAMICS: CVP:  [3 mmHg-7 mmHg] 6 mmHg VENTILATOR SETTINGS: Vent Mode:  [-] PRVC FiO2 (%):  [60 %-100 %] 90 % Set Rate:  [12 bmp-26 bmp] 24 bmp Vt Set:  [400 mL-500 mL] 400 mL PEEP:  [5 cmH20] 5 cmH20 Plateau Pressure:  [23 cmH20-38 cmH20] 25 cmH20 INTAKE / OUTPUT: Intake/Output      08/11 0701 - 08/12 0700 08/12 0701 - 08/13 0700   I.V. (mL/kg) 4051.6 (45.5)    IV Piggyback 360    Total Intake(mL/kg) 4411.6 (49.6)    Urine (mL/kg/hr) 2700 350 (2.1)  Total Output 2700 350   Net +1711.6 -350          PHYSICAL EXAMINATION: General: Adult overweight female, intubated, paralyzed Neuro: Sedated and paralyzed HEENT: [place]/AT. PERRL, sclerae anicteric. Cardiovascular: RRR, no M/R/G.  Lungs: Rales and expiratory wheeze b/l Abdomen: BS x 4, soft, NT/ND.  Musculoskeletal: No gross deformities, trace pedal edema Skin: Intact, cool, no rashes.  LABS:  CBC  Recent Labs Lab 07/28/2015 2017 08/08/2015 2028 08/01/15 0435  WBC 9.6  --  6.5  HGB 11.6* 12.9 11.9*  HCT 38.0 38.0 39.1  PLT 229  --  186   Coag's  Recent Labs Lab 08/09/2015 2258  08/01/15 0700  APTT 31 51*  INR 1.64*  --    BMET  Recent Labs Lab 08/01/15 0100 08/01/15 0443 08/01/15 0700  NA 137 140 139  K 3.2* 3.4* 3.3*  CL 100* 101 100*  CO2 30 26 26   BUN 9 8 7   CREATININE 0.63 0.69 0.66  GLUCOSE 218* 244* 230*   Electrolytes  Recent Labs Lab 08/01/15 0100 08/01/15 0435 08/01/15 0443 08/01/15 0700  CALCIUM 7.0*  --  7.6* 7.7*  MG  --  1.4*  --   --   PHOS  --  3.2  --   --    Sepsis Markers  Recent Labs Lab 08/20/2015 2028 07/28/2015 2258 08/01/15 0435  LATICACIDVEN 5.61* 1.6 1.3  PROCALCITON  --   --  0.23   ABG  Recent Labs Lab 07/28/2015 2044 08/13/2015 2220  PHART 7.244* 7.227*  PCO2ART 65.0* 70.7*  PO2ART 131.0* 95.0   Liver Enzymes  Recent Labs Lab 08/10/2015 2017  AST 88*  ALT 53  ALKPHOS 48  BILITOT 0.4  ALBUMIN 3.4*   Cardiac Enzymes  Recent Labs Lab 08/04/2015 2258 08/01/15 0435  TROPONINI <0.03 <0.03   Glucose  Recent Labs Lab 08/10/2015 2016 08/01/15 0139 08/01/15 0211 08/01/15 0247 08/01/15 0403 08/01/15 0500  GLUCAP 258* 201* 217* 217* 233* 224*    Imaging Ct Head Wo Contrast  08/01/2015   CLINICAL DATA:  Cardiac arrest with altered mental status  EXAM: CT HEAD WITHOUT CONTRAST  TECHNIQUE: Contiguous axial images were obtained from the base of the skull through the vertex without intravenous contrast.  COMPARISON:  May 20, 2009  FINDINGS: Mild diffuse atrophy is stable. There is no intracranial mass, hemorrhage, extra-axial fluid collection, or midline shift. There is no demonstrable loss of gray-white differentiation. There is patchy small vessel disease in the centra semiovale bilaterally, stable. No acute infarct evident. A small amount of basal ganglia calcification may be physiologic.  Bony calvarium appears intact. The mastoid air cells are clear. There are air-fluid levels in both maxillary antra. There is diffuse opacification of the naris bilaterally. There is an air-fluid level in the sphenoid  sinus region as well as diffuse opacification in the ethmoid air cells. There are small air-fluid levels in each frontal sinus. There is evidence of a prior fracture of the left nasal bone.  IMPRESSION: Atrophy with periventricular small vessel disease, stable. No intracranial mass, hemorrhage, or extra-axial fluid collection. No edema or loss of gray-white differentiation. Evidence of extensive sinusitis as well as diffuse nasal turbinate edema. This appearance suggests recent intubation.   Electronically Signed   By: Lowella Grip III M.D.   On: 08/01/2015 00:30   Dg Chest Port 1 View  08/01/2015   CLINICAL DATA:  Respiratory failure.  EXAM: PORTABLE CHEST - 1 VIEW  COMPARISON:  08/01/2015.  FINDINGS: Endotracheal  tube tip 1.8 cm above the carina. Left IJ line in stable position. Heart size stable. Persistent patchy bilateral pulmonary infiltrates noted consistent pneumonia. Small bilateral effusions. No pneumothorax.  IMPRESSION: 1. Endotracheal tube tip 1.8 cm above the carina. Left subclavian central line stable position. 2. Persistent patchy bilateral pulmonary infiltrates consistent with multifocal pneumonia. Small bilateral pleural effusions are present.   Electronically Signed   By: Marcello Moores  Register   On: 08/01/2015 07:25   Dg Chest Port 1 View  08/01/2015   CLINICAL DATA:  Central line placement.  Initial encounter.  EXAM: PORTABLE CHEST - 1 VIEW  COMPARISON:  Chest radiograph performed 08/12/2015  FINDINGS: [name] left subclavian line is noted ending about the distal SVC.  [name] endotracheal tube appears to end overlying the right mainstem bronchus, 1-2 cm below the carina. This should be retracted 4 cm.  Vascular congestion is noted. Patchy bilateral airspace opacities may reflect mild pneumonia or possibly mild interstitial edema. No pleural effusion or pneumothorax is seen.  The cardiomediastinal silhouette is mildly enlarged. No acute osseous abnormalities are identified.   IMPRESSION: 1. Left subclavian line noted ending about the distal SVC. 2. Endotracheal tube ends overlying the right mainstem bronchus, 1-2 cm below the carina. This should be retracted 4 cm. 3. Vascular congestion and mild cardiomegaly. Patchy bilateral airspace opacities may reflect mild pneumonia or possibly mild interstitial edema. These results were called by telephone at the time of interpretation on 08/01/2015 at 1:27 am to Nursing on [facility], who verbally acknowledged these results.   Electronically Signed   By: Garald Balding M.D.   On: 08/01/2015 01:27   Dg Chest Portable 1 View  08/02/2015   CLINICAL DATA:  74 year old female status post intubation.  EXAM: PORTABLE CHEST - 1 VIEW  COMPARISON:  Chest x-ray 06/04/2015.  FINDINGS: An endotracheal tube is in place with tip 1.0 cm above the carina. Nasogastric tube extends into the distal third of the esophagus. Lung volumes are low. There are some bibasilar opacities favored to reflect areas of subsegmental atelectasis. No acute consolidative airspace disease. No pleural effusions. No pneumothorax. No evidence of pulmonary edema. Heart size appears borderline enlarged. Upper mediastinal contours are distorted by patient's rotation to the right. Atherosclerosis in the thoracic aorta.  IMPRESSION: 1. Support apparatus, as above. Endotracheal tube is low, and should be withdrawn 2-3 cm for more optimal placement. Nasogastric to needs to be advanced into the stomach approximately 10 cm. 2. Low lung volumes with probable bibasilar subsegmental atelectasis. 3. Atherosclerosis.   Electronically Signed   By: Vinnie Langton M.D.   On: 07/23/2015 21:03   CXR 8/12 >> b/l pulmonary infiltrates, small b/l effusions  ASSESSMENT / PLAN:  PULMONARY OETT 8/11 >>> A: VDRF following PEA arrest 8/11. Hypercapnic respiratory failure, acute on chronic >> possibly COPD exacerbation. Bilateral pulmonary infiltrates >> possible pneumonia, aspiration vs. CAP. 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 CXR consistent with diffuse infiltrate.  P:   Increase RR to 30 and watch for auto-PEEP VAP bundle. DuoNebs / Albuterol scheduled q6 hrs Increase solumedrol for continued wheezing - 60 q6 for COPD Abx per ID section. Heparin gtt in lieu of outpatient eliquis. CXR in AM. ABG in AM.  CARDIOVASCULAR L [place] CVL 8/12 >> R radial A line 8/12 >> A:  S/p PEA arrest 8/11. Hypotensive on admission -resolved Hx HTN, HLD Elevated lactate - ischemia vs. Infection. Hx DVT/PE on anticoagulation P:  Goal MAP >  85 during cooling. Goal CVP 8 - 12. TTE pending Negative troponins interestingly enough Lactate elevation - trended down to normal Continue outpatient ASA, simvastatin. Restart outpatient metoprolol for HTN Hold outpatient diltiazem, torsemide. D/C levophed. Add norvasc with holding parameters.  RENAL A:   Hypocalcemia. Hypokalemia. Anticipate multiple metabolic derangements during hypothermia protocol. UTI  P:   1g Ca gluconate. K supplement KVO IVF Correct electrolytes as indicated. Frequent BMPs See ID  GASTROINTESTINAL A:   GI prophylaxis. Nutrition. P:   SUP: Pantoprazole. NPO while on hypothermia protocol. 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 VTE Prophylaxis. P:  SCD's / Heparin gtt. Hold outpatient eliquis. CBC in AM.  INFECTIOUS A:   UTI. Bilateral infiltrates - possible pneumonia (aspiration due to hx N/V, AMS vs. CAP) Elevated PCT, high lactate on admission but normalized now, normal WBC. P:   BCx2 8/11 > UCx 8/11 > Sputum Cx 8/11 >  Abx:  Ceftriaxone, start date 8/11>>>8/11 Zosyn 8/11>>> Vancomycin 8/12>>>  Treating for resistant UTI with possible urosepsis component Broaden coverage for PNA (aspiration vs. CAP) Add vancomycin (MRSA swab positive and from a SNF).  ENDOCRINE A:   DM with hyperglycemia P:   Check TSH, cortisol. On  insulin drip ICU hyperglycemia protocols  NEUROLOGIC A:   Acute metabolic encephalopathy. Concern for anoxic brain injury. Hx Bipolar disorder. P:   Sedation:  Cisatracurium gtt / Fentanyl gtt / Midazolam gtt. RASS goal: -5 during hypothermia phase. Hold WUA while paralyzed. Hold outpatient alprazolam, gabapentin, oxycodone, quetiapine, risperidone, trazodone, venlafaxine.  Family updated: No family bedside.  Interdisciplinary Family Meeting v Palliative Care Meeting:  Due by: 08/06/15.  The patient is critically ill with multiple organ systems failure and requires high complexity decision making for assessment and support, frequent evaluation and titration of therapies, application of advanced monitoring technologies and extensive interpretation of multiple databases.   Critical Care Time devoted to patient care services described in this note is  35  Minutes. This time reflects time of care of this signee Dr Jennet Maduro. This critical care time does not reflect procedure time, or teaching time or supervisory time of PA/NP/Med student/Med Resident etc but could involve care discussion time.  Rush Farmer, M.D. [facility] Pulmonary/Critical Care Medicine. Pager: [phone]. After hours pager: [phone].

## 2015-08-01 NOTE — Progress Notes (Signed)
Utilization review completed. Margeaux Swantek, RN, BSN. 

## 2015-08-01 NOTE — Progress Notes (Signed)
[place] Progress Note Patient Name: [name] DOB: March 25, 1941 MRN: [id number]   Date of Service  08/01/2015  HPI/Events of Note  K+ = 3.1 and Creatinine = 0.48.  eICU Interventions  Will replete K+.     Intervention Category Intermediate Interventions: Electrolyte abnormality - evaluation and management  Lysle Dingwall 08/01/2015, 9:32 PM

## 2015-08-01 NOTE — Progress Notes (Signed)
Consult for heparin Indication: chest pain/ACS  aPPT = 62 on 700 units/hr heparin Goal aPPT = 66-102  aPPT is slightly below desired range. Will increase heparin to 750 units/hr and check aPPT/ heparin level daily.  [name], PharmD

## 2015-08-01 NOTE — Progress Notes (Signed)
Called Elink, K 3.2, orders to replace. Blood sugar 201 then 217, starting insulin gtt, systolic bp [phone], prn hydralazine ordered. Will continue to monitor.

## 2015-08-01 NOTE — Progress Notes (Signed)
Notified by Festus Holts RN at bedside that she and several other care providers were unable to place ordered NG.  Dr Oletta Darter, eMD, notified of this and asked to check [facility] for necessary changes to medication routes.

## 2015-08-01 NOTE — Progress Notes (Signed)
Echocardiogram 2D Echocardiogram has been performed.  [name] 08/01/2015, 9:12 AM

## 2015-08-01 NOTE — Progress Notes (Signed)
CRITICAL VALUE ALERT  Critical value received:  Positive Blood cultures-- gram positive cocci in clusters  Date of notification:  08/01/15  Time of notification:  2315  Critical value read back:Yes.    Nurse who received alert:  [place], RN  MD notified (1st page):  Warren Lacy MD  Time of first page:  2330  MD notified (2nd page):  Time of second page:  Responding MD: Warren Lacy MD  Time MD responded:  2330  No new orders given

## 2015-08-01 NOTE — Progress Notes (Signed)
Per ED RN entry ED timeline

## 2015-08-01 NOTE — Progress Notes (Addendum)
ANTICOAGULATION CONSULT NOTE - Follow Up Consult  Pharmacy Consult for heparin Indication: chest pain/ACS  Allergies  Allergen Reactions  . Benzodiazepines Other (See Comments)    Mood dysregulation  (on MAR)  . Morphine And Related Other (See Comments)    Pain dysregulation Mood disruption  . Olanzapine     Hallucinations and disorientation  . Sulfamethoxazole-Trimethoprim Hives    Patient Measurements: Height: 5\' 2"  (157.5 cm) Weight: 196 lb 3.4 oz (89 kg) IBW/kg (Calculated) : 50.1 Heparin Dosing Weight: 72kg  Vital Signs: Temp: 91.8 F (33.2 C) (08/12 0700) Temp Source: Core (Comment) (08/12 0700) BP: 174/84 mmHg (08/12 0746) Pulse Rate: 78 (08/12 0746)  Labs:  Recent Labs  08/03/2015 2017 08/03/2015 2028 08/07/2015 2258 08/01/15 0100 08/01/15 0435 08/01/15 0443 08/01/15 0700  HGB 11.6* 12.9  --   --  11.9*  --   --   HCT 38.0 38.0  --   --  39.1  --   --   PLT 229  --   --   --  186  --   --   APTT  --   --  31  --   --   --  51*  LABPROT  --   --  19.4*  --   --   --   --   INR  --   --  1.64*  --   --   --   --   CREATININE 0.95 0.90 0.72 0.63  --  0.69 0.66  TROPONINI  --   --  <0.03  --  <0.03  --   --     Estimated Creatinine Clearance: 65.9 mL/min (by C-G formula based on Cr of 0.66).   Medications:  Prescriptions prior to admission  Medication Sig Dispense Refill Last Dose  . acetaminophen (TYLENOL) 325 MG tablet Take 650 mg by mouth every 4 (four) hours as needed for mild pain.   unknown  . ALPRAZolam (XANAX) 1 MG tablet Take 1 tablet (1 mg total) by mouth 4 (four) times daily. Take at 0600, 1000, 1400, and 1800. (Patient taking differently: Take 1 mg by mouth 4 (four) times daily. 0600, 1000, 1400, 1600) 30 tablet 0 07/28/2015 at Unknown time  . alum & mag hydroxide-simeth (MAALOX/MYLANTA) 200-200-20 MG/5ML suspension Take 30 mLs by mouth every 4 (four) hours as needed for indigestion.   unknown  . antiseptic oral rinse (BIOTENE) LIQD 15 mLs by Mouth  Rinse route every 4 (four) hours as needed for dry mouth.   unknown  . apixaban (ELIQUIS) 5 MG TABS tablet Take 2 tablets (10 mg total) by mouth 2 (two) times daily. 30 tablet 0 07/30/2015 at 0800  . Artificial Tear Ointment (REFRESH LACRI-LUBE) OINT Place 1 application into both eyes at bedtime.   07/30/2015 at Unknown time  . aspirin 81 MG chewable tablet Chew 81 mg by mouth daily with breakfast.    08/12/2015 at 0800  . benzocaine-menthol (CHLORASEPTIC) 6-10 MG lozenge Take 1 lozenge by mouth every 2 (two) hours as needed for sore throat.   unknown  . bisacodyl (DULCOLAX) 10 MG suppository Place 10 mg rectally as directed. insert three times a week- Monday, Wednesday and Friday, and if no bowel movement place 1 every 2 days   07/30/2015 at Unknown time  . Cranberry-Vitamin C-Inulin (UTI-STAT) LIQD Take 30 mLs by mouth every morning.    unknown  . diltiazem (CARDIZEM CD) 120 MG 24 hr capsule Take 1 capsule (120 mg total) by mouth  daily. 30 capsule 0 08/08/2015 at Unknown time  . esomeprazole (NEXIUM) 40 MG capsule Take 40 mg by mouth daily before breakfast.    07/25/2015 at Unknown time  . fenofibrate (TRICOR) 145 MG tablet Take 145 mg by mouth daily.   08/18/2015 at Unknown time  . fish oil-omega-3 fatty acids 1000 MG capsule Take 1 g by mouth 2 (two) times daily.    08/11/2015 at Unknown time  . Fluticasone-Salmeterol (ADVAIR DISKUS) 250-50 MCG/DOSE AEPB Inhale 1 puff into the lungs 2 (two) times daily. 60 each 11 08/14/2015 at Unknown time  . gabapentin (NEURONTIN) 300 MG capsule Take 3 capsules (900 mg total) by mouth 3 (three) times daily.   08/19/2015 at Unknown time  . GLUCERNA (GLUCERNA) LIQD Take 1 Can by mouth 2 (two) times daily between meals.   unknown  . HYDROcodone-acetaminophen (NORCO/VICODIN) 5-325 MG per tablet Take 1 tablet by mouth every 4 (four) hours as needed for moderate pain.   unknown  . insulin aspart (NOVOLOG) 100 UNIT/ML FlexPen Inject 3-5 Units into the skin 3 (three) times daily  before meals. Give 3 units before meals with an additional 5 units for CBG> or < 150   08/02/2015 at Unknown time  . insulin glargine (LANTUS) 100 UNIT/ML injection Inject 20-22 Units into the skin 2 (two) times daily. Takes 20 units in the morning and 22 units at bedtime   07/30/2015 at Unknown time  . levalbuterol (XOPENEX HFA) 45 MCG/ACT inhaler Inhale 2 puffs into the lungs every 4 (four) hours as needed for wheezing.    unknown  . levalbuterol (XOPENEX) 1.25 MG/0.5ML nebulizer solution Take 1.25 mg by nebulization 4 (four) times daily as needed for wheezing or shortness of breath.   unknown  . loperamide (IMODIUM) 2 MG capsule Take 2 mg by mouth as needed for diarrhea or loose stools (do not exceed 8mg  /24 hours).    unknown  . loratadine (CLARITIN) 10 MG tablet Take 10 mg by mouth every morning.    07/29/2015 at Unknown time  . magnesium hydroxide (MILK OF MAGNESIA) 400 MG/5ML suspension Take 30 mLs by mouth daily as needed for mild constipation.   unknown  . metoprolol tartrate (LOPRESSOR) 50 MG tablet Take 1 tablet (50 mg total) by mouth 2 (two) times daily. At 0800 and 1700. 60 tablet 0 08/07/2015 at 1700  . OxyCODONE (OXYCONTIN) 10 mg T12A 12 hr tablet Take one tablet by mouth every 12 hours PRN pain. Do not crush (Patient taking differently: Take 10 mg by mouth every 12 (twelve) hours. ) 60 tablet 0 07/30/2015 at 1800  . OXYGEN Inhale 2 L into the lungs continuous. Via nasal cannula.   continuous  . Polyethyl Glycol-Propyl Glycol (SYSTANE) 0.4-0.3 % SOLN Place 1 drop into both eyes 4 (four) times daily.   unknown  . polyethylene glycol (MIRALAX / GLYCOLAX) packet Take 17 g by mouth daily.    unknown  . polyvinyl alcohol (ARTIFICIAL TEARS) 1.4 % ophthalmic solution Place 1 drop into both eyes 4 (four) times daily as needed for dry eyes.   08/12/2015 at Unknown time  . polyvinyl alcohol (LIQUIFILM TEARS) 1.4 % ophthalmic solution Place 2 drops into both eyes 4 (four) times daily as needed for dry  eyes.   unknown  . potassium chloride (K-DUR,KLOR-CON) 10 MEQ tablet Take 5 mEq by mouth every evening.    08/10/2015 at Unknown time  . QUEtiapine (SEROQUEL) 100 MG tablet Take 100 mg by mouth at bedtime. Give 1 tablet  along with Seroquel 50mg  to = 150mg  at bedtime   07/30/2015 at Unknown time  . QUEtiapine (SEROQUEL) 50 MG tablet Take 50 mg by mouth at bedtime. Take along with Seroquel 100mg  to = 150mg  at bedtime.   07/30/2015 at Unknown time  . risperiDONE (RISPERDAL) 2 MG tablet Take 2 mg by mouth 2 (two) times daily.   07/24/2015 at Unknown time  . simvastatin (ZOCOR) 10 MG tablet Take 10 mg by mouth daily at 6 PM.    08/16/2015 at Unknown time  . sodium phosphate (FLEET) enema Place 1 enema rectally daily as needed (constipation). follow package directions   unknown  . tiotropium (SPIRIVA) 18 MCG inhalation capsule Place 1 capsule (18 mcg total) into inhaler and inhale daily. 30 capsule 11 07/28/2015 at Unknown time  . torsemide (DEMADEX) 20 MG tablet Take 40 mg by mouth daily with breakfast.    08/15/2015 at Unknown time  . traZODone (DESYREL) 150 MG tablet Take 150 mg by mouth at bedtime.   07/30/2015 at Unknown time  . venlafaxine XR (EFFEXOR-XR) 150 MG 24 hr capsule Take 300 mg by mouth every morning.    07/28/2015 at Unknown time   Scheduled:  . antiseptic oral rinse  7 mL Mouth Rinse Q2H while awake  . artificial tears  1 application Both Eyes 3 times per day  . aspirin  81 mg Oral Daily  . chlorhexidine gluconate  15 mL Mouth Rinse BID  . Chlorhexidine Gluconate Cloth  6 each Topical Q0600  . ipratropium-albuterol  3 mL Nebulization Q6H  . magnesium sulfate 1 - 4 g bolus IVPB  2 g Intravenous Once  . methylPREDNISolone (SOLU-MEDROL) injection  40 mg Intravenous Q12H  . mupirocin ointment  1 application Nasal BID  . pantoprazole (PROTONIX) IV  40 mg Intravenous QHS  . piperacillin-tazobactam (ZOSYN)  IV  3.375 g Intravenous Q8H  . potassium chloride  10 mEq Intravenous Q1 Hr x 6  .  simvastatin  10 mg Per Tube q1800    Assessment 8 YOF with PMH of COPD, DVT, PE, and DM who presents today with cardiac arrest. Now in Afib with RVR s/p 12 minutes of CPR. Code cool has been initiated. Pharmacy consulted to start IV heparin for ACS. -aPTT is 51 and below goal. Heparin level > 2.2 (not reliable with recent apixiban -Patient was on Apixaban prior to admission. Last dose was at 0800 on 08/02/2015   Goal of Therapy:  Heparin level 0.3-0.7 units/ml aPTT 66-102 seconds Monitor platelets by anticoagulation protocol: Yes   Plan:   -Increase heparin to 700 units/hr -aPTT in 6 hours and daily wth CBC daily -Daily heparin level for now  Hildred Laser, Pharm D 08/01/2015 8:13 AM

## 2015-08-01 NOTE — Progress Notes (Signed)
CRITICAL VALUE ALERT  Critical value received: K 2.7 Lactic Acid 4.6   Date of notification:  08/01/15  Time of notification:  1300  Critical value read back:Yes.    Nurse who received alert:  Marta Lamas RN  MD notified (1st page):  Dr Nelda Marseille Time of first page:  47   MD notified (2nd page):  Time of second page:  Responding MD:  Dr. Nelda Marseille  Time MD responded:  [phone]

## 2015-08-01 NOTE — Progress Notes (Signed)
Initial Nutrition Assessment  DOCUMENTATION CODES:   Obesity unspecified  INTERVENTION:    If TF started, recommend Vital High Protein formula -- initiate at 20 ml/hr and increase by 10 ml every 4 hours to goal rate of 50 ml/hr  TF regimen to provide 1200 kcals, 105 gm protein, 1003 ml of free water  NUTRITION DIAGNOSIS:   Inadequate oral intake related to inability to eat as evidenced by NPO status  GOAL:   Provide needs based on ASPEN/SCCM guidelines  MONITOR:   Vent status, Labs, Weight trends, I & O's, Skin  REASON FOR ASSESSMENT:   Ventilator  ASSESSMENT:   74 y.o. F brought to [facility] ED 8/11 after she suffered a 20 min PEA arrest with unclear downtime. Patient had complained of N/V earlier in day, and has current UTI treating with Rocephin but culture resistantly found to be resistant. PCCM called for admission, patient started on hypothermia.   Patient is currently intubated on ventilator support MV: 9.5 L/min Temp (24hrs), Avg:91.9 F (33.3 C), Min:90.7 F (32.6 C), Max:99.1 F (37.3 C)   Pt is currently sedated, paralyzed and on Hypothermia Protocol.  Noted pt with vomiting PTA.  + bilateral infiltrates.  Possible PNA.  Nutrition focused physical exam completed.  No muscle or subcutaneous fat depletion noticed.  Diet Order:  NPO  Skin:  Wound (see comment) (Stage II to buttocks)  Last BM:  PTA  Height:   Ht Readings from Last 1 Encounters:  08/01/15 5\' 2"  (1.575 m)    Weight:   Wt Readings from Last 1 Encounters:  08/01/15 196 lb 3.4 oz (89 kg)    Ideal Body Weight:  50 kg  BMI:  Body mass index is 35.88 kg/(m^2).  Estimated Nutritional Needs:   Kcal:  [phone]  Protein:  100-110 gm  Fluid:  per MD  EDUCATION NEEDS:   No education needs identified at this time  Arthur Holms, RD, LDN Pager #: [phone] After-Hours Pager #: [phone]

## 2015-08-01 NOTE — Plan of Care (Signed)
Problem: Phase I Progression Outcomes Goal: Cool target temp reached 2 hrs from start Outcome: Not Met (add Reason) Extensive time in ED ;  ice packs placed ~ 3 hours after ED arrival

## 2015-08-01 NOTE — Progress Notes (Signed)
[facility] Progress Note Patient Name: [name] DOB: [birth year] MRN: [id number]   Date of Service  08/01/2015  HPI/Events of Note  K=3 with high sugars and high bp sbp>190  eICU Interventions  Potassium replacement, insulin infusion and hydralazine prn     Intervention Category Major Interventions: Hypertension - evaluation and management;Hyperglycemia - active titration of insulin therapy;Electrolyte abnormality - evaluation and management  Flora Lipps 08/01/2015, 2:55 AM

## 2015-08-01 NOTE — Procedures (Signed)
Arterial Catheter Insertion Procedure Note [name] [id number] [birth year]  Procedure: Insertion of Arterial Catheter  Indications: Blood pressure monitoring  Procedure Details Consent: Unable to obtain consent because of emergent medical necessity. Time Out: Verified patient identification, verified procedure, site/side was marked, verified correct patient position, special equipment/implants available, medications/allergies/relevent history reviewed, required imaging and test results available.  Performed  Maximum sterile technique was used including antiseptics, cap, gloves, gown, hand hygiene, mask and sheet. Skin prep: Chlorhexidine; local anesthetic administered 20 gauge catheter was inserted into right radial artery using the Seldinger technique.  Evaluation Blood flow good; BP tracing good. Complications: No apparent complications  Arrow catheter placed on first attempt.   Darral Dash 08/01/2015

## 2015-08-02 ENCOUNTER — Inpatient Hospital Stay (HOSPITAL_COMMUNITY): Payer: Medicare Other

## 2015-08-02 ENCOUNTER — Other Ambulatory Visit (HOSPITAL_COMMUNITY): Payer: Self-pay

## 2015-08-02 DIAGNOSIS — R570 Cardiogenic shock: Secondary | ICD-10-CM

## 2015-08-02 LAB — BASIC METABOLIC PANEL
ANION GAP: 9 (ref 5–15)
ANION GAP: 9 (ref 5–15)
Anion gap: 9 (ref 5–15)
Anion gap: 7 (ref 5–15)
Anion gap: 6 (ref 5–15)
BUN: 8 mg/dL (ref 6–20)
BUN: 7 mg/dL (ref 6–20)
BUN: 6 mg/dL (ref 6–20)
BUN: 7 mg/dL (ref 6–20)
BUN: 8 mg/dL (ref 6–20)
CALCIUM: 7.8 mg/dL — AB (ref 8.9–10.3)
CHLORIDE: 107 mmol/L (ref 101–111)
CHLORIDE: 108 mmol/L (ref 101–111)
CO2: 24 mmol/L (ref 22–32)
CO2: 25 mmol/L (ref 22–32)
CO2: 25 mmol/L (ref 22–32)
CO2: 25 mmol/L (ref 22–32)
CO2: 24 mmol/L (ref 22–32)
CREATININE: 0.49 mg/dL (ref 0.44–1.00)
CREATININE: 0.47 mg/dL (ref 0.44–1.00)
CREATININE: 0.46 mg/dL (ref 0.44–1.00)
CREATININE: 0.65 mg/dL (ref 0.44–1.00)
Calcium: 8 mg/dL — ABNORMAL LOW (ref 8.9–10.3)
Calcium: 8 mg/dL — ABNORMAL LOW (ref 8.9–10.3)
Calcium: 7.7 mg/dL — ABNORMAL LOW (ref 8.9–10.3)
Calcium: 7.3 mg/dL — ABNORMAL LOW (ref 8.9–10.3)
Chloride: 108 mmol/L (ref 101–111)
Chloride: 106 mmol/L (ref 101–111)
Chloride: 107 mmol/L (ref 101–111)
Creatinine, Ser: 0.43 mg/dL — ABNORMAL LOW (ref 0.44–1.00)
GFR calc Af Amer: >60 mL/min (ref >60)
GFR calc Af Amer: >60 mL/min (ref >60)
GFR calc non Af Amer: >60 mL/min (ref >60)
GFR calc non Af Amer: >60 mL/min (ref >60)
GFR calc non Af Amer: >60 mL/min (ref >60)
GFR calc non Af Amer: >60 mL/min (ref >60)
GFR calc non Af Amer: >60 mL/min (ref >60)
GLUCOSE: 197 mg/dL — AB (ref 65–99)
Glucose, Bld: 185 mg/dL — ABNORMAL HIGH (ref 65–99)
Glucose, Bld: 154 mg/dL — ABNORMAL HIGH (ref 65–99)
Glucose, Bld: 191 mg/dL — ABNORMAL HIGH (ref 65–99)
Glucose, Bld: 189 mg/dL — ABNORMAL HIGH (ref 65–99)
Potassium: 2.9 mmol/L — ABNORMAL LOW (ref 3.5–5.1)
Potassium: 3.3 mmol/L — ABNORMAL LOW (ref 3.5–5.1)
Potassium: 3.4 mmol/L — ABNORMAL LOW (ref 3.5–5.1)
Potassium: 3.8 mmol/L (ref 3.5–5.1)
Potassium: 4.5 mmol/L (ref 3.5–5.1)
SODIUM: 141 mmol/L (ref 135–145)
SODIUM: 140 mmol/L (ref 135–145)
Sodium: 139 mmol/L (ref 135–145)
Sodium: 138 mmol/L (ref 135–145)
Sodium: 141 mmol/L (ref 135–145)

## 2015-08-02 LAB — CBC
HCT: 34.5 % — ABNORMAL LOW (ref 36.0–46.0)
Hemoglobin: 10.9 g/dL — ABNORMAL LOW (ref 12.0–15.0)
MCH: 28.6 pg (ref 26.0–34.0)
MCHC: 31.6 g/dL (ref 30.0–36.0)
MCV: 90.6 fL (ref 78.0–100.0)
Platelets: 222 10*3/uL (ref 150–400)
RBC: 3.81 MIL/uL — ABNORMAL LOW (ref 3.87–5.11)
RDW: 14.9 % (ref 11.5–15.5)
WBC: 9.8 10*3/uL (ref 4.0–10.5)

## 2015-08-02 LAB — GLUCOSE, CAPILLARY
GLUCOSE-CAPILLARY: 127 mg/dL — AB (ref 65–99)
GLUCOSE-CAPILLARY: 150 mg/dL — AB (ref 65–99)
GLUCOSE-CAPILLARY: 157 mg/dL — AB (ref 65–99)
GLUCOSE-CAPILLARY: 158 mg/dL — AB (ref 65–99)
GLUCOSE-CAPILLARY: 166 mg/dL — AB (ref 65–99)
GLUCOSE-CAPILLARY: 174 mg/dL — AB (ref 65–99)
GLUCOSE-CAPILLARY: 175 mg/dL — AB (ref 65–99)
GLUCOSE-CAPILLARY: 181 mg/dL — AB (ref 65–99)
GLUCOSE-CAPILLARY: 184 mg/dL — AB (ref 65–99)
Glucose-Capillary: 170 mg/dL — ABNORMAL HIGH (ref 65–99)
Glucose-Capillary: 174 mg/dL — ABNORMAL HIGH (ref 65–99)
Glucose-Capillary: 162 mg/dL — ABNORMAL HIGH (ref 65–99)
Glucose-Capillary: 202 mg/dL — ABNORMAL HIGH (ref 65–99)
Glucose-Capillary: 182 mg/dL — ABNORMAL HIGH (ref 65–99)
Glucose-Capillary: 172 mg/dL — ABNORMAL HIGH (ref 65–99)
Glucose-Capillary: 136 mg/dL — ABNORMAL HIGH (ref 65–99)
Glucose-Capillary: 135 mg/dL — ABNORMAL HIGH (ref 65–99)
Glucose-Capillary: 181 mg/dL — ABNORMAL HIGH (ref 65–99)
Glucose-Capillary: 166 mg/dL — ABNORMAL HIGH (ref 65–99)

## 2015-08-02 LAB — POCT I-STAT, CHEM 8
BUN: 9 mg/dL (ref 6–20)
CALCIUM ION: 1.08 mmol/L — AB (ref 1.13–1.30)
CREATININE: 0.5 mg/dL (ref 0.44–1.00)
Chloride: 100 mmol/L — ABNORMAL LOW (ref 101–111)
GLUCOSE: 238 mg/dL — AB (ref 65–99)
HCT: 40 % (ref 36.0–46.0)
HEMOGLOBIN: 13.6 g/dL (ref 12.0–15.0)
POTASSIUM: 3 mmol/L — AB (ref 3.5–5.1)
Sodium: 143 mmol/L (ref 135–145)
TCO2: 28 mmol/L (ref 0–100)

## 2015-08-02 LAB — BLOOD GAS, ARTERIAL
Acid-base deficit: 1 mmol/L (ref 0.0–2.0)
BICARBONATE: 24.4 meq/L — AB (ref 20.0–24.0)
Drawn by: 44066
FIO2: 0.7
LHR: 30 {breaths}/min
O2 SAT: 98.9 %
PEEP: 10 cmH2O
PH ART: 7.317 — AB (ref 7.350–7.450)
Patient temperature: 98.6
Pressure control: 30 cmH2O
TCO2: 25.9 mmol/L (ref 0–100)
pCO2 arterial: 49.1 mmHg — ABNORMAL HIGH (ref 35.0–45.0)
pO2, Arterial: 199 mmHg — ABNORMAL HIGH (ref 80.0–100.0)

## 2015-08-02 LAB — HEPARIN LEVEL (UNFRACTIONATED): Heparin Unfractionated: 1.82 IU/mL — ABNORMAL HIGH (ref 0.30–0.70)

## 2015-08-02 LAB — APTT
APTT: 58 s — AB (ref 24–37)
aPTT: 55 seconds — ABNORMAL HIGH (ref 24–37)

## 2015-08-02 LAB — MAGNESIUM: MAGNESIUM: 1.8 mg/dL (ref 1.7–2.4)

## 2015-08-02 LAB — PHOSPHORUS: PHOSPHORUS: 2.2 mg/dL — AB (ref 2.5–4.6)

## 2015-08-02 LAB — PROCALCITONIN: PROCALCITONIN: 0.5 ng/mL

## 2015-08-02 MED ORDER — MAGNESIUM SULFATE 2 GM/50ML IV SOLN
2.0000 g | Freq: Once | INTRAVENOUS | Status: AC
  Administered 2015-08-02: 2 g via INTRAVENOUS
  Filled 2015-08-02: qty 50

## 2015-08-02 MED ORDER — HEPARIN (PORCINE) IN NACL 100-0.45 UNIT/ML-% IJ SOLN
950.0000 [IU]/h | INTRAMUSCULAR | Status: DC
  Filled 2015-08-02: qty 250

## 2015-08-02 MED ORDER — VECURONIUM BROMIDE 10 MG IV SOLR
5.0000 mg | INTRAVENOUS | Status: DC | PRN
  Administered 2015-08-02 – 2015-08-03 (×2): 5 mg via INTRAVENOUS
  Filled 2015-08-02 (×5): qty 10

## 2015-08-02 MED ORDER — SODIUM CHLORIDE 0.9 % IV SOLN
1.0000 mg/h | INTRAVENOUS | Status: DC
  Administered 2015-08-02 – 2015-08-03 (×4): 10 mg/h via INTRAVENOUS
  Administered 2015-08-04: 5 mg/h via INTRAVENOUS
  Filled 2015-08-02 (×7): qty 10

## 2015-08-02 MED ORDER — SODIUM CHLORIDE 0.9 % IV SOLN
200.0000 mg | Freq: Two times a day (BID) | INTRAVENOUS | Status: DC
  Administered 2015-08-02 – 2015-08-04 (×5): 200 mg via INTRAVENOUS
  Filled 2015-08-02 (×11): qty 20

## 2015-08-02 MED ORDER — HEPARIN (PORCINE) IN NACL 100-0.45 UNIT/ML-% IJ SOLN
1150.0000 [IU]/h | INTRAMUSCULAR | Status: DC
  Administered 2015-08-03: 1150 [IU]/h via INTRAVENOUS
  Filled 2015-08-02 (×4): qty 250

## 2015-08-02 MED ORDER — SODIUM PHOSPHATE 3 MMOLE/ML IV SOLN
30.0000 mmol | Freq: Once | INTRAVENOUS | Status: AC
  Administered 2015-08-02: 30 mmol via INTRAVENOUS
  Filled 2015-08-02: qty 10

## 2015-08-02 MED ORDER — SODIUM CHLORIDE 0.9 % IV SOLN
2000.0000 mg | Freq: Two times a day (BID) | INTRAVENOUS | Status: DC
  Administered 2015-08-02 – 2015-08-04 (×5): 2000 mg via INTRAVENOUS
  Filled 2015-08-02 (×6): qty 20

## 2015-08-02 MED ORDER — FENTANYL CITRATE (PF) 2500 MCG/50ML IJ SOLN
25.0000 ug/h | INTRAMUSCULAR | Status: DC
  Administered 2015-08-02: 400 ug/h via INTRAVENOUS
  Administered 2015-08-03: 25 ug/h via INTRAVENOUS
  Administered 2015-08-03: 400 ug/h via INTRAVENOUS
  Filled 2015-08-02 (×5): qty 50

## 2015-08-02 NOTE — Progress Notes (Signed)
PULMONARY / CRITICAL CARE MEDICINE   Name: [name] MRN: [id number] DOB: January 30, 1941    ADMISSION DATE:  07/28/2015 CONSULTATION DATE:  08/02/2015  REFERRING MD :  EDP  CHIEF COMPLAINT:  Cardiac Arrest  INITIAL PRESENTATION:  74 y.o. F brought to [facility] ED 8/11 after she suffered a 20 min PEA arrest with unclear downtime.  Patient had complained of N/V earlier in day, and has current UTI treating with Rocephin but culture resistantly found to be resistant. PCCM called for admission, patient started on hypothermia.   STUDIES:  Echo 05/26/2015 >> LVEF 65-70%, mild LVH CXR 8/11 >>> Low volumes with bibasilar subsegmental atelectasis. CT head 8/11 >>> Negative. Stable small vessel atrophy, no mass, hemorrhage, edema  SIGNIFICANT EVENTS: 8/11 - 20 minute PEA arrest, admitted by PCCM, hypothermia started   LINES/DRAINS: R radial A line 8/12 >> L [place] CVL 8/12 >>  BRIEF PATIENT DESCRIPTOR: History obtained from chart review, patient encephalopathic on admission, and currently paralyzed  [name] is a 74 y.o. 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  She was brought to [facility] ED 8/11 after nursing home staff found her in her room and with purple skin tone.  She had no pulses when found and CPR was performed immediately for roughly 20 minutes total.  She received epi x 3 and was intubated in the field.  Initial rhythm on EMS arrival was PEA.  After ROSC, she had A.fib with RVR (HR in 180's), this spontaneously resolved after arrival in ED.  Per family, pt had called one of her daughters earlier in the afternoon complaining of nausea and vomiting.  As far as daughter knows, pt did not eat dinner since she had vomiting after lunch.  From what her daughter knows, pt went to take a nap this afternoon around 4 or 5 and except for the vomiting, she was in her USOH.  She has not had any recent fevers/chills/sweats, headaches, chest pain, SOB,  N/V/D, abdominal pain, myalgias.   In ED, she remained unresponsive and had no sedation.  Family at bedside states that she has been hospitalized in the past for sepsis and that on prior conversations regarding advanced directives, pt indicated that she would like to remain full code.   SUBJECTIVE: Sedated, paralyzed, on hypothermia. No family at bedside. No acute events reported overnight.  VITAL SIGNS: Temp:  [91.2 F (32.9 C)-94.5 F (34.7 C)] 94.5 F (34.7 C) (08/13 0700) Pulse Rate:  [80-99] 94 (08/13 0640) Resp:  [16-30] 28 (08/13 0640) BP: (161-186)/(66-70) 186/70 mmHg (08/12 2122) SpO2:  [100 %] 100 % (08/13 0805) Arterial Line BP: (123-184)/(48-80) 138/54 mmHg (08/13 0640) FiO2 (%):  [70 %-90 %] 70 % (08/13 0808) Weight:  [94.5 kg (208 lb 5.4 oz)] 94.5 kg (208 lb 5.4 oz) (08/13 0600)   HEMODYNAMICS: CVP:  [3 mmHg-13 mmHg] 6 mmHg   VENTILATOR SETTINGS: Vent Mode:  [-] PCV FiO2 (%):  [70 %-90 %] 70 % Set Rate:  [30 bmp] 30 bmp PEEP:  [10 cmH20] 10 cmH20 Plateau Pressure:  [25 YSA63-01 cmH20] 25 cmH20 INTAKE / OUTPUT: Intake/Output      08/12 0701 - 08/13 0700 08/13 0701 - 08/14 0700   I.V. (mL/kg) 1374 (14.5)    IV Piggyback 1570    Total Intake(mL/kg) 2944 (31.2)    Urine (mL/kg/hr) 2090 (0.9)    Total Output 2090     Net +854  PHYSICAL EXAMINATION: General: Adult overweight female, intubated, sedated and paralyzed Neuro: Sedated and paralyzed HEENT: /AT. PERRL, sclerae anicteric. Cardiovascular: RRR, no M/R/G.  Lungs: Rales and expiratory wheeze b/l Abdomen: BS x 4, soft, NT/ND.  Musculoskeletal: No gross deformities, trace pedal edema Skin: Intact, cool, no rashes.  LABS:  CBC  Recent Labs Lab 08/02/2015 2017 08/13/2015 2028 08/01/15 0435 08/02/15 0430  WBC 9.6  --  6.5 9.8  HGB 11.6* 12.9 11.9* 10.9*  HCT 38.0 38.0 39.1 34.5*  PLT 229  --  186 222   Coag's  Recent Labs Lab 08/19/2015 2258 08/01/15 0700 08/01/15 1800 08/01/15 2103   APTT 31 51* 62*  --   INR 1.64*  --   --  1.30   BMET  Recent Labs Lab 08/01/15 2315 08/02/15 0430 08/02/15 0715  NA 141 140 139  K 2.9* 3.3* 3.4*  CL 108 106 107  CO2 24 25 25   BUN 8 7 6   CREATININE 0.49 0.47 0.46  GLUCOSE 185* 197* 154*   Electrolytes  Recent Labs Lab 08/01/15 0435  08/01/15 2315 08/02/15 0430 08/02/15 0715  CALCIUM  --   < > 7.8* 8.0* 8.0*  MG 1.4*  --   --  1.8  --   PHOS 3.2  --   --  2.2*  --   < > = values in this interval not displayed. Sepsis Markers  Recent Labs Lab 08/01/15 0435 08/01/15 1115 08/01/15 1800  LATICACIDVEN 1.3 4.6* 3.6*  PROCALCITON 0.23  --   --    ABG  Recent Labs Lab 08/08/2015 2044 08/18/2015 2220 08/02/15 0435  PHART 7.244* 7.227* 7.317*  PCO2ART 65.0* 70.7* 49.1*  PO2ART 131.0* 95.0 199*   Liver Enzymes  Recent Labs Lab 08/03/2015 2017  AST 88*  ALT 53  ALKPHOS 48  BILITOT 0.4  ALBUMIN 3.4*   Cardiac Enzymes  Recent Labs Lab 08/01/15 0435 08/01/15 1115 08/01/15 1716  TROPONINI <0.03 <0.03 <0.03   Glucose  Recent Labs Lab 08/01/15 2311 08/02/15 0012 08/02/15 0115 08/02/15 0213 08/02/15 0314 08/02/15 0415  GLUCAP 174* 175* 162* 158* 202* 182*    Imaging Dg Abd 1 View  08/01/2015   CLINICAL DATA:  Nasogastric tube placement  EXAM: ABDOMEN - 1 VIEW  COMPARISON:  None.  FINDINGS: Nasogastric tube with the tip above the diaphragm. Recommend advancing nasogastric tube approximately 10 cm. Gaseous distension of the stomach. There is no evidence of pneumoperitoneum, portal venous gas or pneumatosis. There are no pathologic calcifications along the expected course of the ureters.The osseous structures are unremarkable.  IMPRESSION: Nasogastric tube with the tip above the diaphragm. Recommend advancing nasogastric tube approximately 10 cm.   Electronically Signed   By: Kathreen Devoid   On: 08/01/2015 14:05   CXR 8/12 >> b/l pulmonary infiltrates, small b/l effusions  ASSESSMENT /  PLAN:  PULMONARY OETT 8/11 >>> A: VDRF following PEA arrest 8/11. Hypercapnic respiratory failure, acute on chronic >> possibly COPD exacerbation. Bilateral pulmonary infiltrates >> possible pneumonia, aspiration vs. CAP. 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 CXR consistent with diffuse infiltrate.  P:   Full vent support. DuoNebs / Albuterol scheduled q6 hrs Increase solumedrol for continued wheezing - 60 q6 for COPD Abx per ID section. Heparin gtt in lieu of outpatient eliquis. CXR in AM. ABG in AM.  CARDIOVASCULAR L [place] CVL 8/12 >> R radial A line 8/12 >> A:  S/p PEA arrest 8/11. Hypotensive on admission -resolved Hx  HTN, HLD Elevated lactate - ischemia vs. Infection. Hx DVT/PE on anticoagulation P:  Goal MAP > 85 during cooling. Goal CVP 8 - 12. TTE pending Negative troponins interestingly enough Lactate elevation - trended down to normal Continue outpatient ASA, simvastatin. Restart outpatient metoprolol for HTN Hold outpatient diltiazem, torsemide. Levophed. D/C norvasc given pressor demand  RENAL A:   Hypocalcemia. Hypokalemia. Anticipate multiple metabolic derangements during hypothermia protocol. UTI  P:   KVO IVF Correct electrolytes as indicated. BMPs per hypothermia protocol. See ID.  GASTROINTESTINAL A:   GI prophylaxis. Nutrition. P:   SUP: Pantoprazole. NPO while on hypothermia protocol. 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 VTE Prophylaxis. P:  SCD's / Heparin gtt. Hold outpatient eliquis. CBC in AM.  INFECTIOUS A:   UTI. Bilateral infiltrates - possible pneumonia (aspiration due to hx N/V, AMS vs. CAP) Elevated PCT, high lactate on admission but normalized now, normal WBC. P:   BCx2 8/11 > UCx 8/11 > Sputum Cx 8/11 >  Abx:  Ceftriaxone, start date 8/11>>>8/11 Zosyn 8/11>>> Vancomycin 8/12>>>  Treating for resistant UTI with possible urosepsis  component Broaden coverage for PNA (aspiration vs. CAP) Added vancomycin (MRSA swab positive and from a SNF).  ENDOCRINE A:   DM with hyperglycemia P:   TSH, cortisol WNL. On insulin drip ICU hyperglycemia protocols  NEUROLOGIC A:   Acute metabolic encephalopathy. Concern for anoxic brain injury. Hx Bipolar disorder. P:   Sedation:  Cisatracurium gtt / Fentanyl gtt / Midazolam gtt. RASS goal: -5 during hypothermia phase. Hold WUA while paralyzed. Hold outpatient alprazolam, gabapentin, oxycodone, quetiapine, risperidone, trazodone, venlafaxine.  Family updated: No family bedside.  Interdisciplinary Family Meeting v Palliative Care Meeting:  Due by: 08/06/15.  The patient is critically ill with multiple organ systems failure and requires high complexity decision making for assessment and support, frequent evaluation and titration of therapies, application of advanced monitoring technologies and extensive interpretation of multiple databases.   Critical Care Time devoted to patient care services described in this note is  35  Minutes. This time reflects time of care of this signee Dr Jennet Maduro. This critical care time does not reflect procedure time, or teaching time or supervisory time of PA/NP/Med student/Med Resident etc but could involve care discussion time.  Rush Farmer, M.D. [facility] Pulmonary/Critical Care Medicine. Pager: [phone]. After hours pager: [phone].

## 2015-08-02 NOTE — Progress Notes (Signed)
Subjective: Patient remains unresponsive to external stimuli. She is in the process of being rewarmed.  Objective: Current vital signs: BP 186/70 mmHg  Pulse 94  Temp(Src) 94.5 F (34.7 C) (Core (Comment))  Resp 28  Ht 5\' 2"  (1.575 m)  Wt 94.5 kg (208 lb 5.4 oz)  BMI 38.10 kg/m2  SpO2 100%  Neurologic Exam: Intubated and on mechanical ventilation. Patient had no responses to noxious stimuli. Pupils were equal and did not react to light. Extraocular movements are absent with oculocephalic maneuvers. Corneal reflex was absent bilaterally. Face was symmetrical. Gag reflex was absent. Muscle tone was flaccid throughout. Patient had no spontaneous movements and no abnormal posturing. Plantar responses were mute bilaterally.  Continuous EEG monitoring showed indications of severe diffuse encephalopathy with a burst suppression pattern. No clear electrographic seizure activity was recorded.  Medications: I have reviewed the patient's current medications.  Assessment/Plan: 74 year old lady status post cardiac arrest with clinical and EEG findings consistent with severe diffuse anoxic rain injury. Patient has no intact brainstem reflexes in addition to severe cerebral anoxic injury. Prognosis is very poor for recovery to any meaningful functionality, if she were to survive acute illness.  Recommendations: No further neurodiagnostic studies are indicated at this point. Recommend aggressive therapy to the extent of family's wishes.  C.R. Nicole Kindred, MD [facility] [phone]  08/02/2015  8:56 AM

## 2015-08-02 NOTE — Progress Notes (Addendum)
ANTICOAGULATION CONSULT NOTE - Follow Up Consult  Pharmacy Consult for Heparin Indication: hx DVT/PE, Afib, s/p cardiac arrest on arctic sun  Allergies  Allergen Reactions  . Benzodiazepines Other (See Comments)    Mood dysregulation  (on MAR)  . Morphine And Related Other (See Comments)    Pain dysregulation Mood disruption  . Olanzapine     Hallucinations and disorientation  . Sulfamethoxazole-Trimethoprim Hives    Patient Measurements: Height: 5\' 2"  (157.5 cm) Weight: 208 lb 5.4 oz (94.5 kg) IBW/kg (Calculated) : 50.1 Heparin Dosing Weight: ~72kg  Vital Signs: Temp: 96.8 F (36 C) (08/13 1100) Temp Source: Core (Comment) (08/13 1100) Pulse Rate: 107 (08/13 1215)  Labs:  Recent Labs  08-08-2015 2017 08-08-15 2028  08-08-2015 2258  08/01/15 0435  08/01/15 0700 08/01/15 1115 08/01/15 1716 08/01/15 1800  08/01/15 2103  08/02/15 0430 08/02/15 0715 08/02/15 1106  HGB 11.6* 12.9  --   --   --  11.9*  --   --   --   --   --   --   --   --  10.9*  --   --   HCT 38.0 38.0  --   --   --  39.1  --   --   --   --   --   --   --   --  34.5*  --   --   PLT 229  --   --   --   --  186  --   --   --   --   --   --   --   --  222  --   --   APTT  --   --   < > 31  --   --   --  51*  --   --  62*  --   --   --   --   --  55*  LABPROT  --   --   --  19.4*  --   --   --   --   --   --   --   --  16.4*  --   --   --   --   INR  --   --   --  1.64*  --   --   --   --   --   --   --   --  1.30  --   --   --   --   HEPARINUNFRC  --   --   --   --   --   --   --  >2.20*  --   --   --   --   --   --  1.82*  --   --   CREATININE 0.95 0.90  --  0.72  < >  --   < > 0.66 0.62  --   --   < >  --   < > 0.47 0.46 0.43*  TROPONINI  --   --   < > <0.03  --  <0.03  --   --  <0.03 <0.03  --   --   --   --   --   --   --   < > = values in this interval not displayed.  Estimated Creatinine Clearance: 68.1 mL/min (by C-G formula based on Cr of 0.43).   Medications:  Heparin @ 750  units/hr  Assessment: 73yof on apixaban  pta for hx DVT/PE, admitted with cardiac arrest. Apixaban held and she was started on IV heparin. Also noted with new afib. Heparin level today is above goal at 1.82 (indicating apixaban has not yet cleared), however, APTT is below goal at 55. Rewarming began at 0300 this morning so she is likely clearing the heparin faster and will need increased rates.   Goal of Therapy:  APTT 66-102 seconds Heparin level 0.3-0.7 units/ml Monitor platelets by anticoagulation protocol: Yes   Plan:  1) Increase heparin to 950 units/hr 2) Check APTT in 4 hours 3) Continue daily APTT and heparin level for now  Fredrik Rigger 08/02/2015,12:38 PM   ================================   Addendum: - aPTT low at 58, no bleeding, normotensive since ~1700 today   Plan: - Increase heparin gtt to 1150 units/hr - Check 6 hr aPTT   Peniel Biel D. Laney Potash, PharmD, BCPS Pager:  [phone] 08/02/2015, 7:00 PM

## 2015-08-02 NOTE — Progress Notes (Signed)
[facility] Progress Note Patient Name: [name] DOB: 17-Mar-1941 MRN: [id number]   Date of Service  08/02/2015  HPI/Events of Note  maxed out rx for myoclonus interfering with ventilator/ care  eICU Interventions  Add narcuron prn for tonight and regroup in am with neuro input     Intervention Category Major Interventions: Other:  Christinia Gully 08/02/2015, 8:54 PM

## 2015-08-02 NOTE — Progress Notes (Signed)
Spoke with patient's daughter and sister extensively.  We discussed the entire case and that prognosis is very poor.  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  The daughter informed me that her mother would not want to be alive if she can not sit up and speak to people.  Given the fact that with the EEG findings the patient is highly unlikely to be able to do that we decided to make the patient a LCB with no CPR/cardioversion and once the family is ready will transition to comfort care.  Rush Farmer, M.D. [facility] Pulmonary/Critical Care Medicine. Pager: [phone]. After hours pager: [phone].

## 2015-08-02 NOTE — Progress Notes (Signed)
Nimbex has been d/c. Patient continues to have jerking movements. MD is aware  ok to titrate up on Versed. MD to meet with the family within the hour for updates on the patients condition.

## 2015-08-02 NOTE — Progress Notes (Signed)
LTM EEG taken down per Dr. Nicole Kindred.

## 2015-08-02 NOTE — Progress Notes (Signed)
Arctic sun machine manually switched to re-warm mode at 0124 by [place], Therapist, sports and witnessed by Baxter Flattery, Therapist, sports.

## 2015-08-02 NOTE — Procedures (Signed)
Electroencephalogram report- LTM  EEG number: [id number]    Beginning date and time: 08/01/2015 9:28AM Ending date and time:  08/02/2015 7:30AM  Day of study: 1  Medications include: Fentanyl and Versed  MENTAL STATUS (per technician's notes): Intubated. Sedated. Unresponsive.  HISTORY: This 24 hours of intensive EEG monitoring with simultaneous video monitoring was performed for this patient with cardiac arrest and unresponsiveness. Patient is undergoing cooling protocol. Patient is now intubated and sedated.  This EEG was requested to rule out subclinical electrographic seizures.  TECHNICAL DESCRIPTION:  The study consists of a continuous 16-channel multi-montage digital video EEG recording with twenty-one electrodes placed according to the International 10-20 System. Additional leads included eye leads, and an EKG lead. Activation procedures were not done due to mental status.  REPORT: The background activity consists of burst-suppression pattern with 3-7 seconds of suppression in between bursts. The bursts consist of generalized, frontally predominant 5-7 hz spikes with intermixed faster frequencies. No clear electrographic seizures were seen.  There were no pushbutton activations events during this recording.   INTERPRETATION: This is an abnormal EEG due to: 1) Bursts suppression pattern  Clinical Correlation: The burst suppression pattern in post cardiac arrest patient can be associated with poor prognosis. Clinical correlation is required in the presence of anesthetic medications and hypothermia.

## 2015-08-03 ENCOUNTER — Inpatient Hospital Stay (HOSPITAL_COMMUNITY): Payer: Medicare Other

## 2015-08-03 LAB — GLUCOSE, CAPILLARY
GLUCOSE-CAPILLARY: 116 mg/dL — AB (ref 65–99)
GLUCOSE-CAPILLARY: 131 mg/dL — AB (ref 65–99)
GLUCOSE-CAPILLARY: 141 mg/dL — AB (ref 65–99)
GLUCOSE-CAPILLARY: 147 mg/dL — AB (ref 65–99)
GLUCOSE-CAPILLARY: 153 mg/dL — AB (ref 65–99)
GLUCOSE-CAPILLARY: 167 mg/dL — AB (ref 65–99)
GLUCOSE-CAPILLARY: 168 mg/dL — AB (ref 65–99)
GLUCOSE-CAPILLARY: 171 mg/dL — AB (ref 65–99)
GLUCOSE-CAPILLARY: 174 mg/dL — AB (ref 65–99)
GLUCOSE-CAPILLARY: 179 mg/dL — AB (ref 65–99)
GLUCOSE-CAPILLARY: 179 mg/dL — AB (ref 65–99)
Glucose-Capillary: 126 mg/dL — ABNORMAL HIGH (ref 65–99)
Glucose-Capillary: 157 mg/dL — ABNORMAL HIGH (ref 65–99)
Glucose-Capillary: 145 mg/dL — ABNORMAL HIGH (ref 65–99)
Glucose-Capillary: 134 mg/dL — ABNORMAL HIGH (ref 65–99)
Glucose-Capillary: 124 mg/dL — ABNORMAL HIGH (ref 65–99)
Glucose-Capillary: 182 mg/dL — ABNORMAL HIGH (ref 65–99)
Glucose-Capillary: 178 mg/dL — ABNORMAL HIGH (ref 65–99)

## 2015-08-03 LAB — BASIC METABOLIC PANEL
ANION GAP: 8 (ref 5–15)
BUN: 9 mg/dL (ref 6–20)
CALCIUM: 8 mg/dL — AB (ref 8.9–10.3)
CHLORIDE: 106 mmol/L (ref 101–111)
CO2: 24 mmol/L (ref 22–32)
CREATININE: 0.78 mg/dL (ref 0.44–1.00)
GFR calc non Af Amer: >60 mL/min (ref >60)
Glucose, Bld: 122 mg/dL — ABNORMAL HIGH (ref 65–99)
Potassium: 4.8 mmol/L (ref 3.5–5.1)
SODIUM: 138 mmol/L (ref 135–145)

## 2015-08-03 LAB — BLOOD GAS, ARTERIAL
Acid-base deficit: 3.1 mmol/L — ABNORMAL HIGH (ref 0.0–2.0)
Bicarbonate: 22.9 mEq/L (ref 20.0–24.0)
Drawn by: 312761
FIO2: 0.6
LHR: 30 {breaths}/min
O2 SAT: 98.1 %
PATIENT TEMPERATURE: 98.6
PCO2 ART: 52.2 mmHg — AB (ref 35.0–45.0)
PEEP: 10 cmH2O
PH ART: 7.265 — AB (ref 7.350–7.450)
PO2 ART: 131 mmHg — AB (ref 80.0–100.0)
PRESSURE CONTROL: 30 cmH2O
TCO2: 24.5 mmol/L (ref 0–100)

## 2015-08-03 LAB — CBC
HCT: 31.8 % — ABNORMAL LOW (ref 36.0–46.0)
HEMOGLOBIN: 9.7 g/dL — AB (ref 12.0–15.0)
MCH: 28.6 pg (ref 26.0–34.0)
MCHC: 30.5 g/dL (ref 30.0–36.0)
MCV: 93.8 fL (ref 78.0–100.0)
Platelets: 250 10*3/uL (ref 150–400)
RBC: 3.39 MIL/uL — ABNORMAL LOW (ref 3.87–5.11)
RDW: 15.5 % (ref 11.5–15.5)
WBC: 11.4 10*3/uL — AB (ref 4.0–10.5)

## 2015-08-03 LAB — MAGNESIUM: MAGNESIUM: 2.5 mg/dL — AB (ref 1.7–2.4)

## 2015-08-03 LAB — URINE CULTURE: Culture: >=100000

## 2015-08-03 LAB — APTT
aPTT: 85 seconds — ABNORMAL HIGH (ref 24–37)
aPTT: 89 seconds — ABNORMAL HIGH (ref 24–37)

## 2015-08-03 LAB — HEPARIN LEVEL (UNFRACTIONATED): Heparin Unfractionated: 1.5 IU/mL — ABNORMAL HIGH (ref 0.30–0.70)

## 2015-08-03 LAB — PHOSPHORUS: PHOSPHORUS: 4 mg/dL (ref 2.5–4.6)

## 2015-08-03 MED ORDER — DEXTROSE 10 % IV SOLN: INTRAVENOUS | Status: DC | PRN

## 2015-08-03 MED ORDER — VITAL AF 1.2 CAL PO LIQD
1000.0000 mL | ORAL | Status: DC
  Filled 2015-08-03 (×2): qty 1000

## 2015-08-03 MED ORDER — INSULIN ASPART 100 UNIT/ML ~~LOC~~ SOLN
2.0000 [IU] | SUBCUTANEOUS | Status: DC
  Administered 2015-08-03: 2 [IU] via SUBCUTANEOUS
  Administered 2015-08-03 (×2): 4 [IU] via SUBCUTANEOUS
  Administered 2015-08-03: 2 [IU] via SUBCUTANEOUS

## 2015-08-03 MED ORDER — INSULIN GLARGINE 100 UNIT/ML ~~LOC~~ SOLN
10.0000 [IU] | SUBCUTANEOUS | Status: DC
  Filled 2015-08-03: qty 0.1

## 2015-08-03 MED ORDER — INSULIN GLARGINE 100 UNIT/ML ~~LOC~~ SOLN
10.0000 [IU] | SUBCUTANEOUS | Status: DC
  Administered 2015-08-03: 10 [IU] via SUBCUTANEOUS
  Filled 2015-08-03 (×3): qty 0.1

## 2015-08-03 MED ORDER — VITAL HIGH PROTEIN PO LIQD
1000.0000 mL | ORAL | Status: DC
  Filled 2015-08-03 (×2): qty 1000

## 2015-08-03 NOTE — Progress Notes (Addendum)
Brief Nutrition Note  Consult received for enteral/tube feeding initiation and management.  Adult Enteral Nutrition Protocol initiated with recommendations from initial assessment on 8/12.   **ADDENDUM: Spoke with Pharmacy, who states that Vital HP is on back-order currently. RD initiated TF protocol with different formula.  RD to follow-up 8/15.  Admitting Dx: Cardiac arrest [I46.9] Respiratory failure [J96.90] Tachycardia [R00.0] Endotracheally intubated [Z78.9] Hypotension, unspecified hypotension type [I95.9]  Body mass index is 38.1 kg/(m^2). Pt meets criteria for obesity based on current BMI.  Labs:   Recent Labs Lab 08/01/15 0435  08/02/15 0430  08/02/15 1106 08/02/15 1500 08/03/15 0215  NA  --   < > 140  < > 138 141 138  K  --   < > 3.3*  < > 3.8 4.5 4.8  CL  --   < > 106  < > 107 108 106  CO2  --   < > 25  < > 25 24 24   BUN  --   < > 7  < > 7 8 9   CREATININE  --   < > 0.47  < > 0.43* 0.65 0.78  CALCIUM  --   < > 8.0*  < > 7.7* 7.3* 8.0*  MG 1.4*  --  1.8  --   --   --  2.5*  PHOS 3.2  --  2.2*  --   --   --  4.0  GLUCOSE  --   < > 197*  < > 191* 189* 122*  < > = values in this interval not displayed.  Clayton Bibles, MS, RD, LDN Pager: [phone] After Hours Pager: [phone]

## 2015-08-03 NOTE — Progress Notes (Signed)
Subjective: No recurrence of seizure activity reported. Myoclonic movements worsened when sedating medications were discontinued. Norcuron was started.  Objective: Current vital signs: BP 139/58 mmHg  Pulse 116  Temp(Src) 98.2 F (36.8 C) (Core (Comment))  Resp 36  Ht 5\' 2"  (1.575 m)  Wt 94.5 kg (208 lb 5.4 oz)  BMI 38.10 kg/m2  SpO2 100%  Neurologic Exam: Patient remains intubated on mechanical ventilation. She had no responses to noxious stimuli. Pupillary reactions were absent. Extraocular movements were absent. Corneal reflexes absent. Muscle tone was flaccid throughout. There was no abnormal posturing. Patient had no withdrawal movements to noxious stimuli.  Medications: I have reviewed the patient's current medications.  Assessment/Plan: 74 year old lady status post cardiac arrest with severe diffuse irreversible anoxic brain injury. Prognosis for recovery is poor.  No further neurological intervention is indicated. Agree with plans for withdrawing support and comfort care when family is ready to do so.  C.R. Nicole Kindred, MD [facility] [phone]  08/03/2015  10:39 AM

## 2015-08-03 NOTE — Progress Notes (Addendum)
ANTICOAGULATION CONSULT NOTE - Follow Up Consult  Pharmacy Consult for Heparin Indication: hx DVT/PE, Afib, s/p cardiac arrest on ar[place] sun  Allergies  Allergen Reactions  . Benzodiazepines Other (See Comments)    Mood dysregulation  (on MAR)  . Morphine And Related Other (See Comments)    Pain dysregulation Mood disruption  . Olanzapine     Hallucinations and disorientation  . Sulfamethoxazole-Trimethoprim Hives    Patient Measurements: Height: 5\' 2"  (157.5 cm) Weight: 208 lb 5.4 oz (94.5 kg) IBW/kg (Calculated) : 50.1 Heparin Dosing Weight: ~72kg  Vital Signs: Temp: 98.6 F (37 C) (08/14 0400) Temp Source: Core (Comment) (08/14 0300) BP: 139/58 mmHg (08/14 0312) Pulse Rate: 115 (08/14 0400)  Labs:  Recent Labs  07/26/2015 2258  08/01/15 0435  08/01/15 0700 08/01/15 1115 08/01/15 1716  08/01/15 2103  08/02/15 0430  08/02/15 1106 08/02/15 1500 08/02/15 1754 08/03/15 0215  HGB  --   < > 11.9*  --   --   --   --   --   --   --  10.9*  --   --   --   --  9.7*  HCT  --   < > 39.1  --   --   --   --   --   --   --  34.5*  --   --   --   --  31.8*  PLT  --   --  186  --   --   --   --   --   --   --  222  --   --   --   --  250  APTT 31  --   --   --  51*  --   --   < >  --   --   --   --  55*  --  58* 85*  LABPROT 19.4*  --   --   --   --   --   --   --  16.4*  --   --   --   --   --   --   --   INR 1.64*  --   --   --   --   --   --   --  1.30  --   --   --   --   --   --   --   HEPARINUNFRC  --   --   --   --  >2.20*  --   --   --   --   --  1.82*  --   --   --   --  1.50*  CREATININE 0.72  < >  --   < > 0.66 0.62  --   < >  --   < > 0.47  < > 0.43* 0.65  --  0.78  TROPONINI <0.03  --  <0.03  --   --  <0.03 <0.03  --   --   --   --   --   --   --   --   --   < > = values in this interval not displayed.  Estimated Creatinine Clearance: 68.1 mL/min (by C-G formula based on Cr of 0.78).   Medications:  Heparin @ 950 units/hr  Assessment: 73yof on apixaban pta  for hx DVT/PE, admitted with cardiac arrest. Apixaban held and she was started on IV heparin. Also noted with new afib.  Heparin level remains above goal at 1.5 (apixaban still affecting). aPTT therapeutic at 85. Hgb down to 9,7, plt ok. No overt bleeding noted.  Goal of Therapy:  APTT 66-102 seconds Heparin level 0.3-0.7 units/ml Monitor platelets by anticoagulation protocol: Yes   Plan:  1) Continue heparin 950 units/hr 2) aPTT in 6 hours to confirm therapeutic 3) Continue daily aPTT and heparin level  Sherlon Handing, PharmD, BCPS Clinical pharmacist, pager [phone] 08/03/2015,4:26 AM   Addendum: Confirmatory aPTT is therapeutic at 89. Continue heparin at 950 units/hr.  Nena Jordan, PharmD, BCPS 08/03/2015, 12:39 AM

## 2015-08-03 NOTE — Progress Notes (Signed)
PULMONARY / CRITICAL CARE MEDICINE   Name: [name] MRN: [id number] DOB: Apr 28, 1941    ADMISSION DATE:  08/20/2015 CONSULTATION DATE:  08/03/2015  REFERRING MD :  EDP  CHIEF COMPLAINT:  Cardiac Arrest  INITIAL PRESENTATION:  74 y.o. F brought to [facility] ED 8/11 after she suffered a 20 min PEA arrest with unclear downtime.  Patient had complained of N/V earlier in day, and has current UTI treating with Rocephin but culture resistantly found to be resistant. PCCM called for admission, patient started on hypothermia.   STUDIES:  Echo 05/26/2015 >> LVEF 65-70%, mild LVH CXR 8/11 >>> Low volumes with bibasilar subsegmental atelectasis. CT head 8/11 >>> Negative. Stable small vessel atrophy, no mass, hemorrhage, edema  SIGNIFICANT EVENTS: 8/11 - 20 minute PEA arrest, admitted by PCCM, hypothermia started   LINES/DRAINS: R radial A line 8/12 >> L [place] CVL 8/12 >>  BRIEF PATIENT DESCRIPTOR: History obtained from chart review, patient encephalopathic on admission, and currently paralyzed  [name] is a 74 y.o. 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  She was brought to [facility] ED 8/11 after nursing home staff found her in her room and with purple skin tone.  She had no pulses when found and CPR was performed immediately for roughly 20 minutes total.  She received epi x 3 and was intubated in the field.  Initial rhythm on EMS arrival was PEA.  After ROSC, she had A.fib with RVR (HR in 180's), this spontaneously resolved after arrival in ED.  Per family, pt had called one of her daughters earlier in the afternoon complaining of nausea and vomiting.  As far as daughter knows, pt did not eat dinner since she had vomiting after lunch.  From what her daughter knows, pt went to take a nap this afternoon around 4 or 5 and except for the vomiting, she was in her USOH.  She has not had any recent fevers/chills/sweats, headaches, chest pain, SOB,  N/V/D, abdominal pain, myalgias.   In ED, she remained unresponsive and had no sedation.  Family at bedside states that she has been hospitalized in the past for sepsis and that on prior conversations regarding advanced directives, pt indicated that she would like to remain full code.   SUBJECTIVE: Sedated, paralyzed, on hypothermia. No family at bedside. No acute events reported overnight.  VITAL SIGNS: Temp:  [95 F (35 C)-99.3 F (37.4 C)] 98.2 F (36.8 C) (08/14 0724) Pulse Rate:  [98-116] 116 (08/14 0821) Resp:  [13-36] 36 (08/14 0821) BP: (118-140)/(55-59) 139/58 mmHg (08/14 0312) SpO2:  [96 %-100 %] 100 % (08/14 0821) Arterial Line BP: (108-150)/(46-66) 119/54 mmHg (08/14 0600) FiO2 (%):  [60 %-70 %] 60 % (08/14 0821)   HEMODYNAMICS: CVP:  [5 mmHg-15 mmHg] 15 mmHg   VENTILATOR SETTINGS: Vent Mode:  [-] PCV FiO2 (%):  [60 %-70 %] 60 % Set Rate:  [30 bmp] 30 bmp PEEP:  [10 cmH20] 10 cmH20 Plateau Pressure:  [22 cmH20-33 cmH20] 28 cmH20 INTAKE / OUTPUT: Intake/Output      08/13 0701 - 08/14 0700 08/14 0701 - 08/15 0700   I.V. (mL/kg) 1839.1 (19.5)    Other 60    IV Piggyback 1085    Total Intake(mL/kg) 2984.1 (31.6)    Urine (mL/kg/hr) 740 (0.3)    Total Output 740     Net +2244.1           PHYSICAL EXAMINATION: General:  Adult overweight female, intubated, sedated with persistent myoclonus. Neuro: Sedated, unresponsive, myoclonus. HEENT: [place]/AT. PERRL, sclerae anicteric. Cardiovascular: RRR, no M/R/G.  Lungs: Rales and expiratory wheeze b/l Abdomen: BS x 4, soft, NT/ND.  Musculoskeletal: No gross deformities, trace pedal edema Skin: Intact, cool, no rashes.  LABS:  CBC  Recent Labs Lab 08/01/15 0435 08/02/15 0430 08/03/15 0215  WBC 6.5 9.8 11.4*  HGB 11.9* 10.9* 9.7*  HCT 39.1 34.5* 31.8*  PLT 186 222 250   Coag's  Recent Labs Lab 07/26/2015 2258  08/01/15 2103 08/02/15 1106 08/02/15 1754 08/03/15 0215  APTT 31  < >  --  55* 58* 85*  INR  1.64*  --  1.30  --   --   --   < > = values in this interval not displayed. BMET  Recent Labs Lab 08/02/15 1106 08/02/15 1500 08/03/15 0215  NA 138 141 138  K 3.8 4.5 4.8  CL 107 108 106  CO2 25 24 24   BUN 7 8 9   CREATININE 0.43* 0.65 0.78  GLUCOSE 191* 189* 122*   Electrolytes  Recent Labs Lab 08/01/15 0435  08/02/15 0430  08/02/15 1106 08/02/15 1500 08/03/15 0215  CALCIUM  --   < > 8.0*  < > 7.7* 7.3* 8.0*  MG 1.4*  --  1.8  --   --   --  2.5*  PHOS 3.2  --  2.2*  --   --   --  4.0  < > = values in this interval not displayed. Sepsis Markers  Recent Labs Lab 08/01/15 0435 08/01/15 1115 08/01/15 1800 08/02/15 0430  LATICACIDVEN 1.3 4.6* 3.6*  --   PROCALCITON 0.23  --   --  0.50   ABG  Recent Labs Lab 08/17/2015 2220 08/02/15 0435 08/03/15 0320  PHART 7.227* 7.317* 7.265*  PCO2ART 70.7* 49.1* 52.2*  PO2ART 95.0 199* 131*   Liver Enzymes  Recent Labs Lab 07/30/2015 2017  AST 88*  ALT 53  ALKPHOS 48  BILITOT 0.4  ALBUMIN 3.4*   Cardiac Enzymes  Recent Labs Lab 08/01/15 0435 08/01/15 1115 08/01/15 1716  TROPONINI <0.03 <0.03 <0.03   Glucose  Recent Labs Lab 08/03/15 0019 08/03/15 0119 08/03/15 0219 08/03/15 0326 08/03/15 0419 08/03/15 0722  GLUCAP 153* 124* 116* 174* 171* 131*    Imaging No results found. CXR 8/12 >> b/l pulmonary infiltrates, small b/l effusions  ASSESSMENT / PLAN:  PULMONARY OETT 8/11 >>> A: VDRF following PEA arrest 8/11. Hypercapnic respiratory failure, acute on chronic >> possibly COPD exacerbation. Bilateral pulmonary infiltrates >> possible pneumonia, aspiration vs. CAP. 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 CXR consistent with diffuse infiltrate.  P:   Full vent support given neuro status. Increase RR to 35, decrease FiO2 to 50 and maintain PEEP at 10. DuoNebs / Albuterol scheduled q6 hrs. Increase solumedrol for continued wheezing - 60 q6 for COPD. Abx per ID  section. Heparin gtt in lieu of outpatient eliquis, continue for now. CXR in AM. ABG in AM.  CARDIOVASCULAR L [place] CVL 8/12 >> R radial A line 8/12 >> A:  S/p PEA arrest 8/11. Hypotensive on admission -resolved Hx HTN, HLD Elevated lactate - ischemia vs. Infection. Hx DVT/PE on anticoagulation P:  Goal MAP > 65. Goal CVP 8 - 12. TTE pending Negative troponins interestingly enough Continue outpatient ASA, simvastatin. Hold outpatient diltiazem, torsemide. Levophed for MAP goal above.  RENAL A:   Hypocalcemia. Hypokalemia. Anticipate multiple metabolic derangements during hypothermia  protocol. UTI  P:   KVO IVF Correct electrolytes as indicated. BMP in AM. See ID.  GASTROINTESTINAL A:   GI prophylaxis. Nutrition. P:   SUP: Pantoprazole. Consult nutrition for TF. 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 VTE Prophylaxis. P:  SCD's / Heparin gtt. Hold outpatient eliquis. CBC in AM.   INFECTIOUS A:   UTI. Bilateral infiltrates - possible pneumonia (aspiration due to hx N/V, AMS vs. CAP) Elevated PCT, high lactate on admission but normalized now, normal WBC. P:   BCx2 8/11 > UCx 8/11 > Sputum Cx 8/11 >  Abx:  Ceftriaxone, start date 8/11>>>8/11 Zosyn 8/11>>> Vancomycin 8/12>>>  Treating for resistant UTI with possible urosepsis component Broaden coverage for PNA (aspiration vs. CAP) as above Added vancomycin (MRSA swab positive and from a SNF).  ENDOCRINE A:   DM with hyperglycemia P:   TSH, cortisol WNL. On insulin drip ICU hyperglycemia protocols  NEUROLOGIC A:   Acute metabolic encephalopathy. Severe for anoxic brain injury. Hx Bipolar disorder. Communicated with neuro, no chance at any reasonable recovery. P:   D/C Fentanyl gtt / Midazolam gtt, will accept myoclonus. RASS goal: 0 during hypothermia phase. Hold WUA while paralyzed. Hold outpatient alprazolam, gabapentin, oxycodone, quetiapine,  risperidone, trazodone, venlafaxine.  Family updated: Spoke with daughter and sister yesterday, they do not wish for trach/peg, they are bringing in the rest of the family for withdrawal of mechanical ventilation.  LCB for now with no CPR/cardioversion.  The patient is critically ill with multiple organ systems failure and requires high complexity decision making for assessment and support, frequent evaluation and titration of therapies, application of advanced monitoring technologies and extensive interpretation of multiple databases.   Critical Care Time devoted to patient care services described in this note is  35  Minutes. This time reflects time of care of this signee Dr Jennet Maduro. This critical care time does not reflect procedure time, or teaching time or supervisory time of PA/NP/Med student/Med Resident etc but could involve care discussion time.  Rush Farmer, M.D. [facility] Pulmonary/Critical Care Medicine. Pager: [phone]. After hours pager: [phone].

## 2015-08-04 ENCOUNTER — Inpatient Hospital Stay (HOSPITAL_COMMUNITY): Payer: Medicare Other

## 2015-08-04 LAB — CBC
HCT: 33.9 % — ABNORMAL LOW (ref 36.0–46.0)
Hemoglobin: 10.3 g/dL — ABNORMAL LOW (ref 12.0–15.0)
MCH: 28.6 pg (ref 26.0–34.0)
MCHC: 30.4 g/dL (ref 30.0–36.0)
MCV: 94.2 fL (ref 78.0–100.0)
PLATELETS: 285 10*3/uL (ref 150–400)
RBC: 3.6 MIL/uL — AB (ref 3.87–5.11)
RDW: 15.7 % — ABNORMAL HIGH (ref 11.5–15.5)
WBC: 17.7 10*3/uL — ABNORMAL HIGH (ref 4.0–10.5)

## 2015-08-04 LAB — POCT I-STAT 3, ART BLOOD GAS (G3+)
Acid-base deficit: 7 mmol/L — ABNORMAL HIGH (ref 0.0–2.0)
BICARBONATE: 20.1 meq/L (ref 20.0–24.0)
O2 SAT: 99 %
PCO2 ART: 48.1 mmHg — AB (ref 35.0–45.0)
Patient temperature: 36.9
TCO2: 22 mmol/L (ref 0–100)
pH, Arterial: 7.229 — ABNORMAL LOW (ref 7.350–7.450)
pO2, Arterial: 141 mmHg — ABNORMAL HIGH (ref 80.0–100.0)

## 2015-08-04 LAB — BASIC METABOLIC PANEL
Anion gap: 14 (ref 5–15)
BUN: 25 mg/dL — AB (ref 6–20)
CALCIUM: 8.7 mg/dL — AB (ref 8.9–10.3)
CO2: 20 mmol/L — ABNORMAL LOW (ref 22–32)
Chloride: 107 mmol/L (ref 101–111)
Creatinine, Ser: 0.97 mg/dL (ref 0.44–1.00)
GFR calc Af Amer: >60 mL/min (ref >60)
GFR, EST NON AFRICAN AMERICAN: 57 mL/min — AB (ref >60)
GLUCOSE: 79 mg/dL (ref 65–99)
Potassium: 5 mmol/L (ref 3.5–5.1)
Sodium: 141 mmol/L (ref 135–145)

## 2015-08-04 LAB — GLUCOSE, CAPILLARY
GLUCOSE-CAPILLARY: 85 mg/dL (ref 65–99)
GLUCOSE-CAPILLARY: 84 mg/dL (ref 65–99)
Glucose-Capillary: 76 mg/dL (ref 65–99)
Glucose-Capillary: 105 mg/dL — ABNORMAL HIGH (ref 65–99)

## 2015-08-04 LAB — HEPARIN LEVEL (UNFRACTIONATED): Heparin Unfractionated: 1.08 IU/mL — ABNORMAL HIGH (ref 0.30–0.70)

## 2015-08-04 LAB — APTT: APTT: 70 s — AB (ref 24–37)

## 2015-08-04 LAB — CULTURE, RESPIRATORY

## 2015-08-04 LAB — CULTURE, RESPIRATORY W GRAM STAIN

## 2015-08-04 LAB — MAGNESIUM: Magnesium: 2.5 mg/dL — ABNORMAL HIGH (ref 1.7–2.4)

## 2015-08-04 LAB — PHOSPHORUS: Phosphorus: 3.4 mg/dL (ref 2.5–4.6)

## 2015-08-04 MED ORDER — FENTANYL BOLUS VIA INFUSION
50.0000 ug | INTRAVENOUS | Status: DC | PRN
  Filled 2015-08-04: qty 200

## 2015-08-04 MED ORDER — MIDAZOLAM BOLUS VIA INFUSION
2.0000 mg | INTRAVENOUS | Status: DC | PRN
  Filled 2015-08-04: qty 4

## 2015-08-04 MED ORDER — SODIUM CHLORIDE 0.9 % IV SOLN
100.0000 ug/h | INTRAVENOUS | Status: DC
  Administered 2015-08-04: 400 ug/h via INTRAVENOUS
  Filled 2015-08-04 (×4): qty 50

## 2015-08-04 MED ORDER — MIDAZOLAM HCL 5 MG/ML IJ SOLN
10.0000 mg/h | INTRAMUSCULAR | Status: DC
  Administered 2015-08-05: 10 mg/h via INTRAVENOUS
  Filled 2015-08-04 (×4): qty 10

## 2015-08-04 NOTE — Progress Notes (Signed)
PULMONARY / CRITICAL CARE MEDICINE   Name: [name] MRN: [id number] DOB: 1941/02/20    ADMISSION DATE:  08/02/2015 CONSULTATION DATE:  08/04/2015  REFERRING MD :  EDP  CHIEF COMPLAINT:  Cardiac Arrest  INITIAL PRESENTATION:  74 y.o. F brought to [facility] ED 8/11 after she suffered a 20 min PEA arrest with unclear downtime.  Patient had complained of N/V earlier in day, and has current UTI treating with Rocephin but culture resistantly found to be resistant. PCCM called for admission, patient started on hypothermia.   STUDIES:  Echo 05/26/2015 >> LVEF 65-70%, mild LVH CXR 8/11 >>> Low volumes with bibasilar subsegmental atelectasis. CT head 8/11 >>> Negative. Stable small vessel atrophy, no mass, hemorrhage, edema 8/13 EEG >> Bursts suppression pattern  SIGNIFICANT EVENTS: 8/11 - 20 minute PEA arrest, admitted by PCCM, hypothermia started   LINES/DRAINS: R radial A line 8/12 >> L [place] CVL 8/12 >>  BRIEF PATIENT DESCRIPTOR: History obtained from chart review, patient encephalopathic on admission, and currently paralyzed  [name] is a 74 y.o. 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  She was brought to [facility] ED 8/11 after nursing home staff found her in her room and with purple skin tone.  She had no pulses when found and CPR was performed immediately for roughly 20 minutes total.  She received epi x 3 and was intubated in the field.  Initial rhythm on EMS arrival was PEA.  After ROSC, she had A.fib with RVR (HR in 180's), this spontaneously resolved after arrival in ED.  Per family, pt had called one of her daughters earlier in the afternoon complaining of nausea and vomiting.  As far as daughter knows, pt did not eat dinner since she had vomiting after lunch.  From what her daughter knows, pt went to take a nap this afternoon around 4 or 5 and except for the vomiting, she was in her USOH.  She has not had any recent  fevers/chills/sweats, headaches, chest pain, SOB, N/V/D, abdominal pain, myalgias.   In ED, she remained unresponsive and had no sedation.  Family at bedside states that she has been hospitalized in the past for sepsis and that on prior conversations regarding advanced directives, pt indicated that she would like to remain full code.   SUBJECTIVE: afebrile Continues to have facial myoclonic activity No obvious pain   VITAL SIGNS: Temp:  [98.2 F (36.8 C)-99.3 F (37.4 C)] 98.6 F (37 C) (08/15 0800) Pulse Rate:  [112-131] 126 (08/15 0800) Resp:  [12-42] 21 (08/15 0800) BP: (135-167)/(57-70) 135/57 mmHg (08/15 0748) SpO2:  [95 %-100 %] 100 % (08/15 0800) Arterial Line BP: (122-187)/(55-76) 131/58 mmHg (08/15 0800) FiO2 (%):  [50 %] 50 % (08/15 0748) Weight:  [94.5 kg (208 lb 5.4 oz)-99.3 kg (218 lb 14.7 oz)] 99.3 kg (218 lb 14.7 oz) (08/15 0500)   HEMODYNAMICS: CVP:  [13 mmHg-19 mmHg] 13 mmHg   VENTILATOR SETTINGS: Vent Mode:  [-] PCV FiO2 (%):  [50 %] 50 % Set Rate:  [35 bmp] 35 bmp PEEP:  [10 cmH20] 10 cmH20 Plateau Pressure:  [21 cmH20-33 cmH20] 21 cmH20 INTAKE / OUTPUT: Intake/Output      08/14 0701 - 08/15 0700 08/15 0701 - 08/16 0700   I.V. (mL/kg) 477.7 (4.8) 73 (0.7)   Other     IV Piggyback 515    Total Intake(mL/kg) 992.7 (10) 73 (0.7)   Urine (mL/kg/hr) 1030 (0.4)  150 (0.5)   Total Output 1030 150   Net -37.3 -77         PHYSICAL EXAMINATION: General: Adult overweight female, intubated, sedated with persistent myoclonus. Neuro: Sedated, unresponsive, myoclonus. HEENT: [place]/AT. PERRL, sclerae anicteric. Cardiovascular: RRR, no M/R/G.  Lungs: Rales and expiratory wheeze b/l Abdomen: BS x 4, soft, NT/ND.  Musculoskeletal: No gross deformities, trace pedal edema Skin: Intact, cool, no rashes.  LABS:  CBC  Recent Labs Lab 08/02/15 0430 08/03/15 0215 08/04/15 0340  WBC 9.8 11.4* 17.7*  HGB 10.9* 9.7* 10.3*  HCT 34.5* 31.8* 33.9*  PLT 222 250 285    Coag's  Recent Labs Lab 08/06/2015 2258  08/01/15 2103  08/03/15 0215 08/03/15 1030 08/04/15 0340  APTT 31  < >  --   < > 85* 89* 70*  INR 1.64*  --  1.30  --   --   --   --   < > = values in this interval not displayed. BMET  Recent Labs Lab 08/02/15 1500 08/03/15 0215 08/04/15 0340  NA 141 138 141  K 4.5 4.8 5.0  CL 108 106 107  CO2 24 24 20*  BUN 8 9 25*  CREATININE 0.65 0.78 0.97  GLUCOSE 189* 122* 79   Electrolytes  Recent Labs Lab 08/02/15 0430  08/02/15 1500 08/03/15 0215 08/04/15 0340  CALCIUM 8.0*  < > 7.3* 8.0* 8.7*  MG 1.8  --   --  2.5* 2.5*  PHOS 2.2*  --   --  4.0 3.4  < > = values in this interval not displayed. Sepsis Markers  Recent Labs Lab 08/01/15 0435 08/01/15 1115 08/01/15 1800 08/02/15 0430  LATICACIDVEN 1.3 4.6* 3.6*  --   PROCALCITON 0.23  --   --  0.50   ABG  Recent Labs Lab 08/02/15 0435 08/03/15 0320 08/04/15 0403  PHART 7.317* 7.265* 7.229*  PCO2ART 49.1* 52.2* 48.1*  PO2ART 199* 131* 141.0*   Liver Enzymes  Recent Labs Lab 07/23/2015 2017  AST 88*  ALT 53  ALKPHOS 48  BILITOT 0.4  ALBUMIN 3.4*   Cardiac Enzymes  Recent Labs Lab 08/01/15 0435 08/01/15 1115 08/01/15 1716  TROPONINI <0.03 <0.03 <0.03   Glucose  Recent Labs Lab 08/03/15 1151 08/03/15 1632 08/03/15 2019 08/03/15 2319 08/04/15 0340 08/04/15 0731  GLUCAP 182* 178* 147* 85 76 84    Imaging Dg Chest Port 1 View  08/04/2015   CLINICAL DATA:  Intubation.  EXAM: PORTABLE CHEST - 1 VIEW  COMPARISON:  None.  FINDINGS: Endotracheal tube tip noted at the origin right mainstem bronchus. Left subclavian central line noted with tip at cavoatrial junction. Heart size normal. Persistent left upper lobe and bibasilar pulmonary infiltrates. No pleural effusion or pneumothorax.  IMPRESSION: Endotracheal tube tip noted at the orifice of the right mainstem bronchus. Retraction of 2 cm suggested. Left subclavian central line in stable position.   Critical Value/emergent results were called by telephone at the time of interpretation on 08/04/2015 at 7:30 am to nurse [place], who verbally acknowledged these results.   Electronically Signed   By: Marcello Moores  Register   On: 08/04/2015 07:32   CXR 8/12 >> b/l pulmonary infiltrates, small b/l effusions  ASSESSMENT / PLAN:  PULMONARY OETT 8/11 >>> A: VDRF following PEA arrest 8/11. Hypercapnic respiratory failure, acute on chronic >> possibly COPD exacerbation. Bilateral pulmonary infiltrates >> possible pneumonia, aspiration vs. CAP. 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 CXR consistent with diffuse infiltrate.  P:   Full vent support given neuro status. Drop RR to 25, decrease FiO2 to 50 and drop PEEP to 5 DuoNebs / Albuterol scheduled q6 hrs. Increase solumedrol for continued wheezing - 60 q6 for COPD. Heparin gtt in lieu of outpatient eliquis, continue for now.   CARDIOVASCULAR L [place] CVL 8/12 >> R radial A line 8/12 >> A:  S/p PEA arrest 8/11. Hypotensive on admission -resolved Hx HTN, HLD Elevated lactate - ischemia vs. Infection. Hx DVT/PE on anticoagulation Echo 8/12 nml EF P:  Goal MAP > 65. Goal CVP 8 - 12. Continue outpatient ASA, simvastatin. Hold outpatient diltiazem, torsemide. Levophed off   RENAL A:   Hypocalcemia. Hypokalemia. Anticipate multiple metabolic derangements during hypothermia protocol. UTI  P:   KVO IVF Correct electrolytes as indicated.  GASTROINTESTINAL A:   GI prophylaxis. Nutrition. P:   SUP: Pantoprazole. ct TF.   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 VTE Prophylaxis. P:  SCD's / Heparin gtt. Hold outpatient eliquis. CBC in AM.   INFECTIOUS A:   UTI. Bilateral infiltrates - possible pneumonia (aspiration due to hx N/V, AMS vs. CAP) Elevated PCT, high lactate on admission but normalized now, normal WBC. P:   BCx2 8/11 > UCx 8/11 >e coli  S to zosyn / imipenem Sputum Cx  8/11 > staph >>  Abx:  Ceftriaxone, start date 8/11>>>8/11 Zosyn 8/11>>> Vancomycin 8/12>>>  Treating for resistant UTI with possible urosepsis component Broaden coverage for PNA (aspiration vs. CAP) as above Added vancomycin (MRSA swab positive and from a SNF).  ENDOCRINE A:   DM with hyperglycemia TSH, cortisol WNL. P:   Off insulin drip ICU hyperglycemia protocols  NEUROLOGIC A:   Acute metabolic encephalopathy. Severe for anoxic brain injury. Hx Bipolar disorder. Communicated with neuro, no chance at any reasonable recovery. P:   ct Fentanyl gtt   Resume Midazolam gtt for  myoclonus. RASS goal: 0 during hypothermia phase. Hold outpatient alprazolam, gabapentin, oxycodone, quetiapine, risperidone, trazodone, venlafaxine.  Family updated: 8/14 Spoke with daughter and sister , they do not wish for trach/peg, they are bringing in the rest of the family for withdrawal of mechanical ventilation.  LCB for now with no CPR/cardioversion.  The patient is critically ill with multiple organ systems failure and requires high complexity decision making for assessment and support, frequent evaluation and titration of therapies, application of advanced monitoring technologies and extensive interpretation of multiple databases.   Critical Care Time devoted to patient care services described in this note is  35  Minutes.   Kara Mead MD. Shade Flood. [facility] Pulmonary & Critical care Pager [phone] If no response call [phone]

## 2015-08-04 NOTE — Care Management Important Message (Signed)
Important Message  Patient Details  Name: [name] MRN: [id number] Date of Birth: [birth year]   Medicare Important Message Given:  Yes-second notification given    Lacretia Leigh, RN 08/04/2015, 10:55 AM

## 2015-08-04 NOTE — Progress Notes (Signed)
CSW order received - patient is from [employer].  Current order in place for comfort care. Message left for [facility]- Admissions at [facility] re: above. Lorie Phenix. Pauline Good, M[place]

## 2015-08-04 NOTE — Progress Notes (Signed)
   08/04/15 1500  Clinical Encounter Type  Visited With Family  Visit Type Patient actively dying;Death;Psychological support;Spiritual support  Referral From Nurse  Consult/Referral To Chaplain  Spiritual Encounters  Spiritual Needs Emotional;Grief support;Prayer  Ch responded to spiritual consult as pt transition to comfort care and actively dying; offered grief support and prayer for the family as needed.

## 2015-08-04 NOTE — Progress Notes (Signed)
Vent settings changed by Dr. Elsworth Soho.  RT will monitor.

## 2015-08-04 NOTE — Progress Notes (Signed)
Nutrition Consult/Brief Note  Chart reviewed. Pt now transitioning to comfort/withdrawal of care.  No further nutrition interventions warranted at this time.  Please re-consult as needed.   Arthur Holms, RD, LDN Pager #: [phone] After-Hours Pager #: [phone]

## 2015-08-04 NOTE — Procedures (Signed)
Extubation Procedure Note  Patient Details:   Name: [name] DOB: 01/18/1941 MRN: [id number]   Pt extubated per MD order, terminal extubation.  Family, RN & chaplin at bedside.    Ciro Backer 08/04/2015, 2:53 PM

## 2015-08-04 NOTE — Progress Notes (Signed)
ANTICOAGULATION CONSULT NOTE - Follow Up Consult  Pharmacy Consult for Heparin, vancomycin, zosyn Indication: hx DVT/PE, Afib,   Allergies  Allergen Reactions  . Benzodiazepines Other (See Comments)    Mood dysregulation  (on MAR)  . Morphine And Related Other (See Comments)    Pain dysregulation Mood disruption  . Olanzapine     Hallucinations and disorientation  . Sulfamethoxazole-Trimethoprim Hives    Patient Measurements: Height: 5\' 2"  (157.5 cm) Weight: 218 lb 14.7 oz (99.3 kg) IBW/kg (Calculated) : 50.1 Heparin Dosing Weight: ~72kg  Vital Signs: Temp: 98.6 F (37 C) (08/15 0800) Temp Source: Core (Comment) (08/15 0800) BP: 135/57 mmHg (08/15 0748) Pulse Rate: 126 (08/15 0800)  Labs:  Recent Labs  08/01/15 1115 08/01/15 1716  08/01/15 2103  08/02/15 0430  08/02/15 1500  08/03/15 0215 08/03/15 1030 08/04/15 0340  HGB  --   --   --   --   < > 10.9*  --   --   --  9.7*  --  10.3*  HCT  --   --   --   --   --  34.5*  --   --   --  31.8*  --  33.9*  PLT  --   --   --   --   --  222  --   --   --  250  --  285  APTT  --   --   < >  --   --   --   < >  --   < > 85* 89* 70*  LABPROT  --   --   --  16.4*  --   --   --   --   --   --   --   --   INR  --   --   --  1.30  --   --   --   --   --   --   --   --   HEPARINUNFRC  --   --   --   --   --  1.82*  --   --   --  1.50*  --  1.08*  CREATININE 0.62  --   < >  --   < > 0.47  < > 0.65  --  0.78  --  0.97  TROPONINI <0.03 <0.03  --   --   --   --   --   --   --   --   --   --   < > = values in this interval not displayed.  Estimated Creatinine Clearance: 57.8 mL/min (by C-G formula based on Cr of 0.97).   Medications:  Heparin @ 1150 units/hr  Assessment: 73yof on apixaban pta for hx DVT/PE, admitted with cardiac arrest. Apixaban held and she was started on IV heparin. Also noted with new afib. Heparin level remains above goal at 1.5 (apixaban still affecting). aPTT therapeutic at 70. Hgb 10.3, plt ok.   She is  also on zosyn/vancomycin for staph aureus PNA and e coli UTI. WBC= 10.3, afebrile, SCr= 0.97 (trend up) and CrCl ~ 60. She is noted with severe anoxic encephalopathy and plans are for w/d of care when family is ready.   8/11 rocephin>> 8/12 8/12 vanc>> 8/12 zosyn>>  8/11 blood>> 1/2 gram + cocci in clusters>>CoNS 8/12 resp>> staph aureus 8/12 urine>> >100k Ecoli (sens imipenem, zosyn)   Goal of Therapy:  APTT 66-102 seconds Heparin level  0.3-0.7 units/ml Monitor platelets by anticoagulation protocol: Yes  Vancomycin trough: 15-20   Plan:  -No heparin changes needed -No changes in antibiotic dosing needed -Daily heparin level, aPTT and CBC  Hildred Laser, Pharm D 08/04/2015 10:03 AM

## 2015-08-05 LAB — CULTURE, BLOOD (ROUTINE X 2): CULTURE: NO GROWTH

## 2015-08-21 NOTE — Progress Notes (Signed)
Pt passed at [place], family at bedside. Two RNs listened for two minutes, no heart sounds or lung sounds heard.

## 2015-08-21 NOTE — Discharge Summary (Signed)
PULMONARY / CRITICAL CARE MEDICINE   Name: [name] MRN: [id number] DOB: 16-Jan-1941    ADMISSION DATE:  07/30/2015 CONSULTATION DATE:  08/06/2015  REFERRING MD :  EDP  CHIEF COMPLAINT:  Cardiac Arrest  INITIAL PRESENTATION:  74 y.o. F brought to [facility] ED 8/11 after she suffered a 20 min PEA arrest with unclear downtime.  Patient had complained of N/V earlier in day, and has current UTI treating with Rocephin but culture resistantly found to be resistant. PCCM called for admission, patient started on hypothermia.   STUDIES:  Echo 05/26/2015 >> LVEF 65-70%, mild LVH CXR 8/11 >>> Low volumes with bibasilar subsegmental atelectasis. CT head 8/11 >>> Negative. Stable small vessel atrophy, no mass, hemorrhage, edema 8/13 EEG >> Bursts suppression pattern  SIGNIFICANT EVENTS: 8/11 - 20 minute PEA arrest, admitted by PCCM, hypothermia started   LINES/DRAINS: R radial A line 8/12 >> L [place] CVL 8/12 >>  BRIEF PATIENT HISTORY :  [name] is a 74 y.o. 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  She was brought to [facility] ED 8/11 after nursing home staff found her in her room and with purple skin tone.  She had no pulses when found and CPR was performed immediately for roughly 20 minutes total.  She received epi x 3 and was intubated in the field.  Initial rhythm on EMS arrival was PEA.  After ROSC, she had A.fib with RVR (HR in 180's), this spontaneously resolved after arrival in ED.  Per family, pt had called one of her daughters earlier in the afternoon complaining of nausea and vomiting.  As far as daughter knows, pt did not eat dinner since she had vomiting after lunch.  From what her daughter knows, pt went to take a nap this afternoon around 4 or 5 and except for the vomiting, she was in her USOH.  She has not had any recent fevers/chills/sweats, headaches, chest pain, SOB, N/V/D, abdominal pain, myalgias.   In ED, she remained  unresponsive and had no sedation.  Family at bedside states that she has been hospitalized in the past for sepsis and that on prior conversations regarding advanced directives, pt indicated that she would like to remain full code.   ASSESSMENT / PLAN:  PULMONARY OETT 8/11 >>> A: VDRF following PEA arrest 8/11. Hypercapnic respiratory failure, acute on chronic >> possibly COPD exacerbation. Bilateral pulmonary infiltrates >> possible pneumonia, aspiration vs. CAP. 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 CXR consistent with diffuse infiltrate.  P:   Full vent support given neuro status. DuoNebs / Albuterol scheduled q6 hrs. solumedrol for continued wheezing - 60 q6 for COPD. Heparin gtt in lieu of outpatient eliquis, continue for now.   CARDIOVASCULAR L [place] CVL 8/12 >> R radial A line 8/12 >> A:  S/p PEA arrest 8/11. Hypotensive on admission -resolved Hx HTN, HLD Elevated lactate - ischemia vs. Infection. Hx DVT/PE on anticoagulation Echo 8/12 nml EF P:  Goal MAP > 65. Goal CVP 8 - 12. Continue outpatient ASA, simvastatin. Hold outpatient diltiazem, torsemide. Levophed off   RENAL A:   Hypocalcemia. Hypokalemia. Anticipate multiple metabolic derangements during hypothermia protocol. UTI  P:   KVO IVF Correct electrolytes as indicated.  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 VTE Prophylaxis. P:  SCD's / Heparin gtt. Hold outpatient eliquis. CBC in AM.  INFECTIOUS A:   UTI. Bilateral infiltrates - possible pneumonia (aspiration due to hx N/V, AMS vs. CAP) Elevated PCT, high lactate on admission but normalized now, normal WBC. P:   BCx2 8/11 > UCx 8/11 >e coli  S to zosyn / imipenem Sputum Cx 8/11 > staph >>  Abx:  Ceftriaxone, start date 8/11>>>8/11 Zosyn 8/11>>> Vancomycin 8/12>>>  Treating for resistant UTI with possible urosepsis component Broaden coverage for PNA (aspiration vs. CAP) as above Added  vancomycin (MRSA swab positive and from a SNF).  ENDOCRINE A:   DM with hyperglycemia TSH, cortisol WNL. P:   Off insulin drip ICU hyperglycemia protocols  NEUROLOGIC A:   Acute anoxic encephalopathy. Hx Bipolar disorder. Communicated with neuro, no chance at any reasonable recovery. P:   ct Fentanyl gtt   Resume Midazolam gtt for  myoclonus. RASS goal: 0 during hypothermia phase. Hold outpatient alprazolam, gabapentin, oxycodone, quetiapine, risperidone, trazodone, venlafaxine.  Family updated: 8/14 Spoke with daughter and sister , they do not wish for trach/peg, they are bringing in the rest of the family for withdrawal of mechanical ventilation.  LCB for now with no CPR/cardioversion.  Course- family agreed to withdrawal of life support and she passed away soon after  Cause of death- anoxic encephalopathy post cardiac arrest and acute respiratory failure, comorbidities including diabetes, COPD and PE  Kara Mead MD. FCCP. [facility]

## 2015-08-21 NOTE — Progress Notes (Signed)
Two RNs, Lexi potter and Doretha Sou, witnessed wasting 100 mL of fentanyl bag and 86mL of versed bag in sink.

## 2015-08-21 DEATH — deceased

## 2015-08-27 ENCOUNTER — Telehealth: Payer: Self-pay

## 2015-08-27 NOTE — Telephone Encounter (Signed)
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  Taking upstairs to leave for signature.  He is in office this afternoon.  Received back 08-28-15.  Faxed and called for pick-up.

## 2015-09-02 IMAGING — CR DG CHEST 1V PORT
1 series · 1 of 1 positions shown · non-contrast
Comparison: Chest x-ray 06/04/2015.

CLINICAL DATA: 73-year-old female status post intubation.

EXAM:
PORTABLE CHEST - 1 VIEW

[AP]
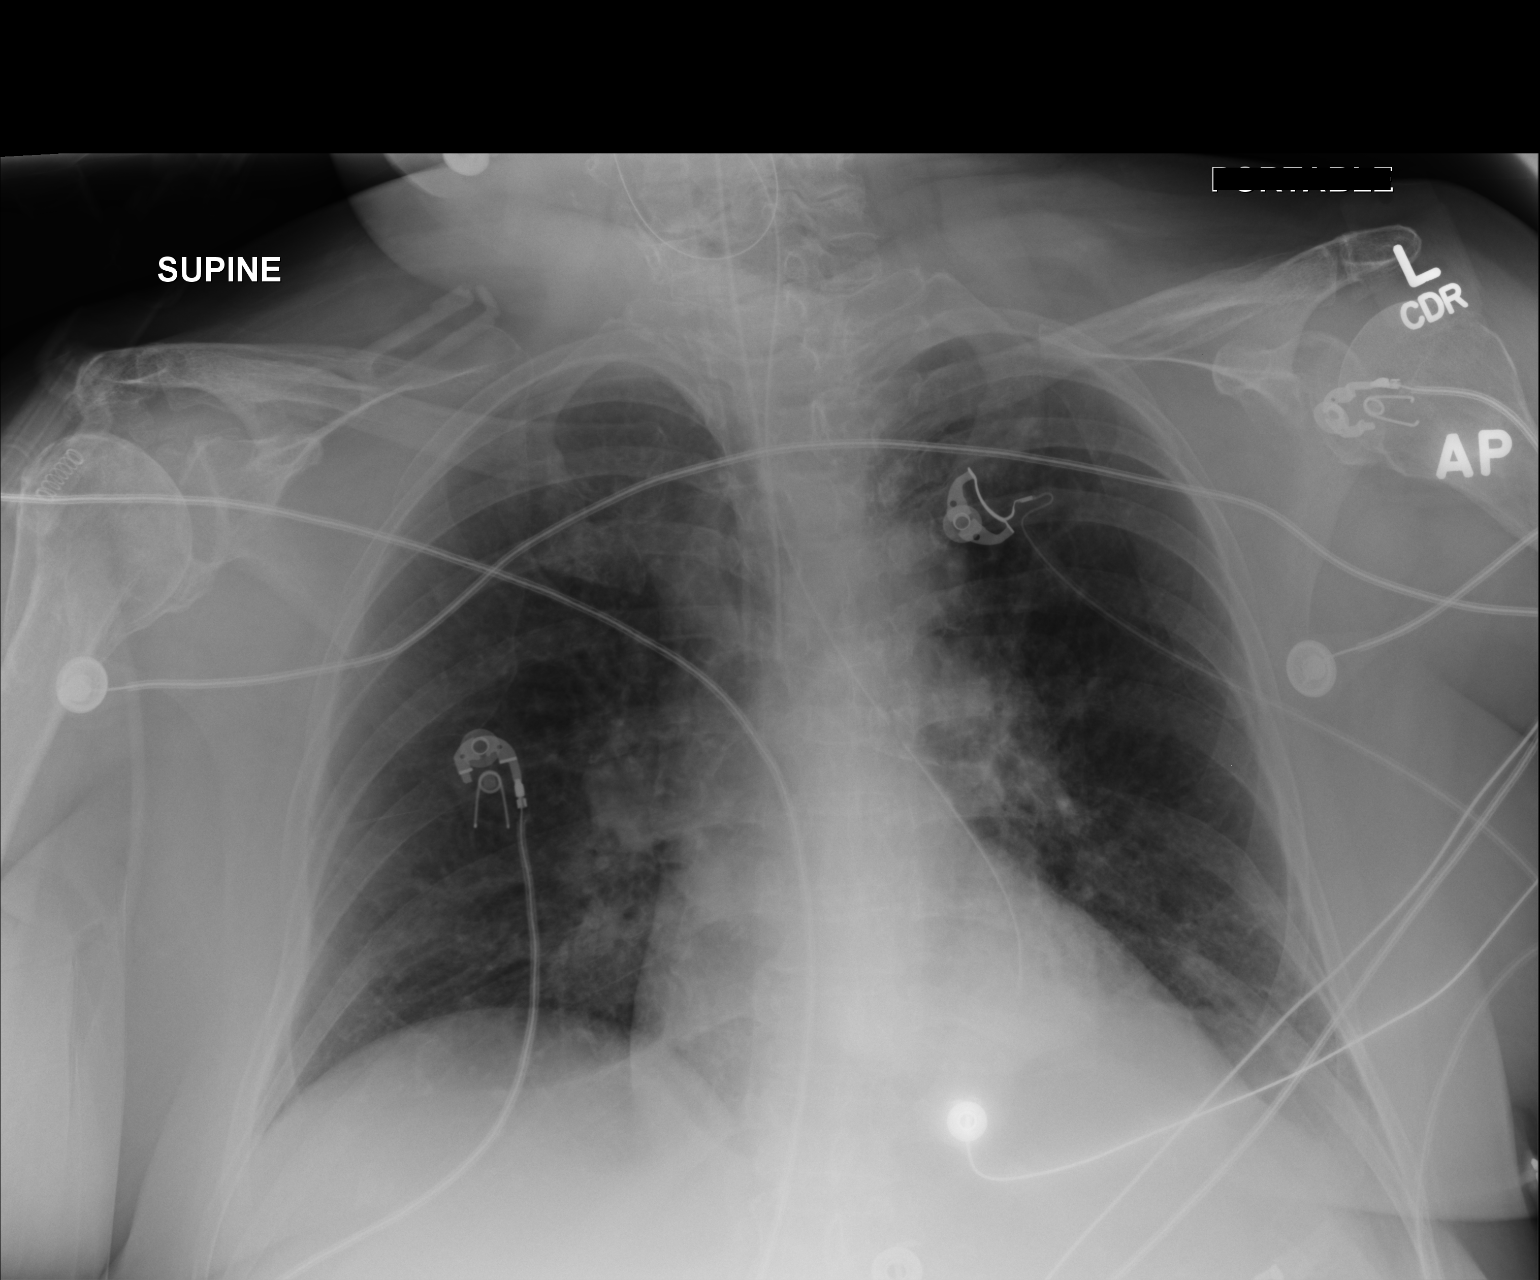

[1 of 1 positions shown; findings below may reference images not displayed]

FINDINGS: An endotracheal tube is in place with tip 1.0 cm above the carina.
Nasogastric tube extends into the distal third of the esophagus.
Lung volumes are low. There are some bibasilar opacities favored to
reflect areas of subsegmental atelectasis. No acute consolidative
airspace disease. No pleural effusions. No pneumothorax. No evidence
of pulmonary edema. Heart size appears borderline enlarged. Upper
mediastinal contours are distorted by patient's rotation to the
right. Atherosclerosis in the thoracic aorta.
IMPRESSION: 1. Support apparatus, as above. Endotracheal tube is low, and should
be withdrawn 2-3 cm for more optimal placement. Nasogastric to needs
to be advanced into the stomach approximately 10 cm.
2. Low lung volumes with probable bibasilar subsegmental
atelectasis.
3. Atherosclerosis.

## 2015-09-03 IMAGING — DX DG ABDOMEN 1V
1 series · 1 of 1 positions shown · non-contrast
Comparison: None.

CLINICAL DATA: Nasogastric tube placement

EXAM:
ABDOMEN - 1 VIEW

[abdomen supine]
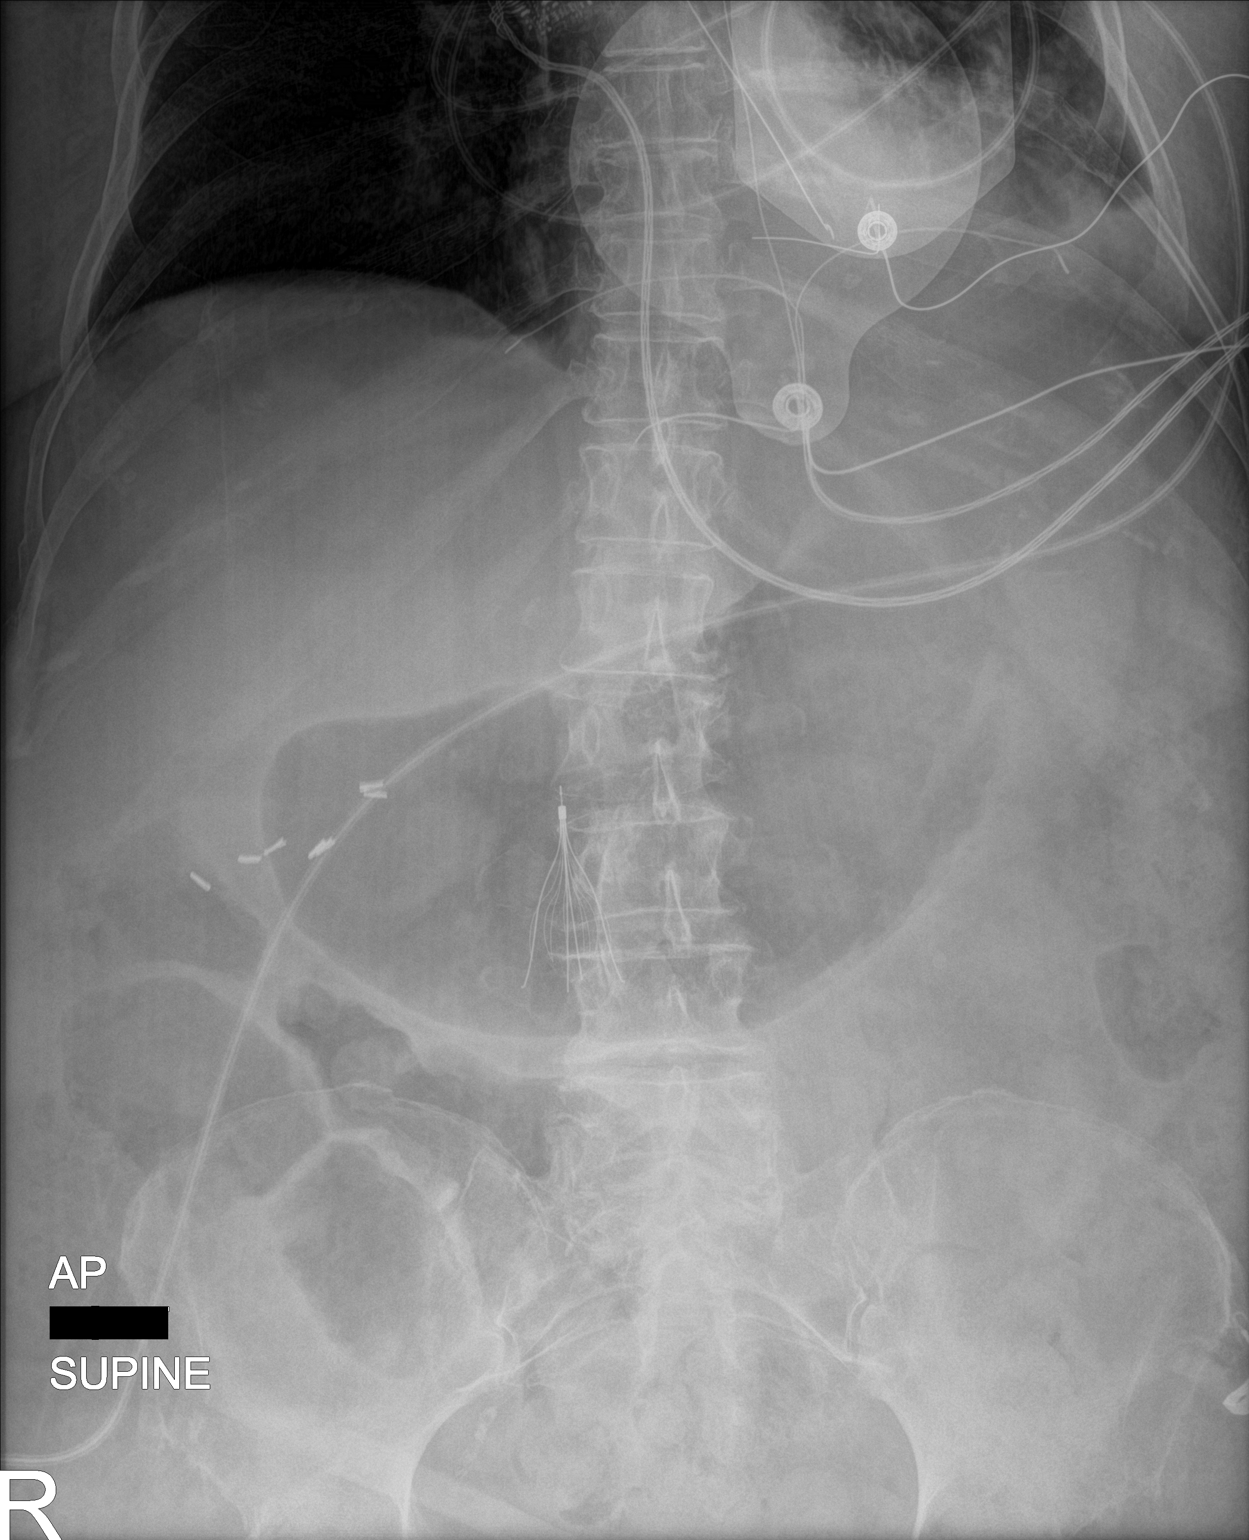

[1 of 1 positions shown; findings below may reference images not displayed]

FINDINGS: Nasogastric tube with the tip above the diaphragm. Recommend
advancing nasogastric tube approximately 10 cm. Gaseous distension
of the stomach. There is no evidence of pneumoperitoneum, portal
venous gas or pneumatosis. There are no pathologic calcifications
along the expected course of the ureters.The osseous structures are
unremarkable.
IMPRESSION: Nasogastric tube with the tip above the diaphragm. Recommend
advancing nasogastric tube approximately 10 cm.

## 2015-09-03 IMAGING — CR DG CHEST 1V PORT
1 series · 1 of 1 positions shown · non-contrast
Comparison: 08/01/2015.

CLINICAL DATA: Respiratory failure.

EXAM:
PORTABLE CHEST - 1 VIEW

[AP]
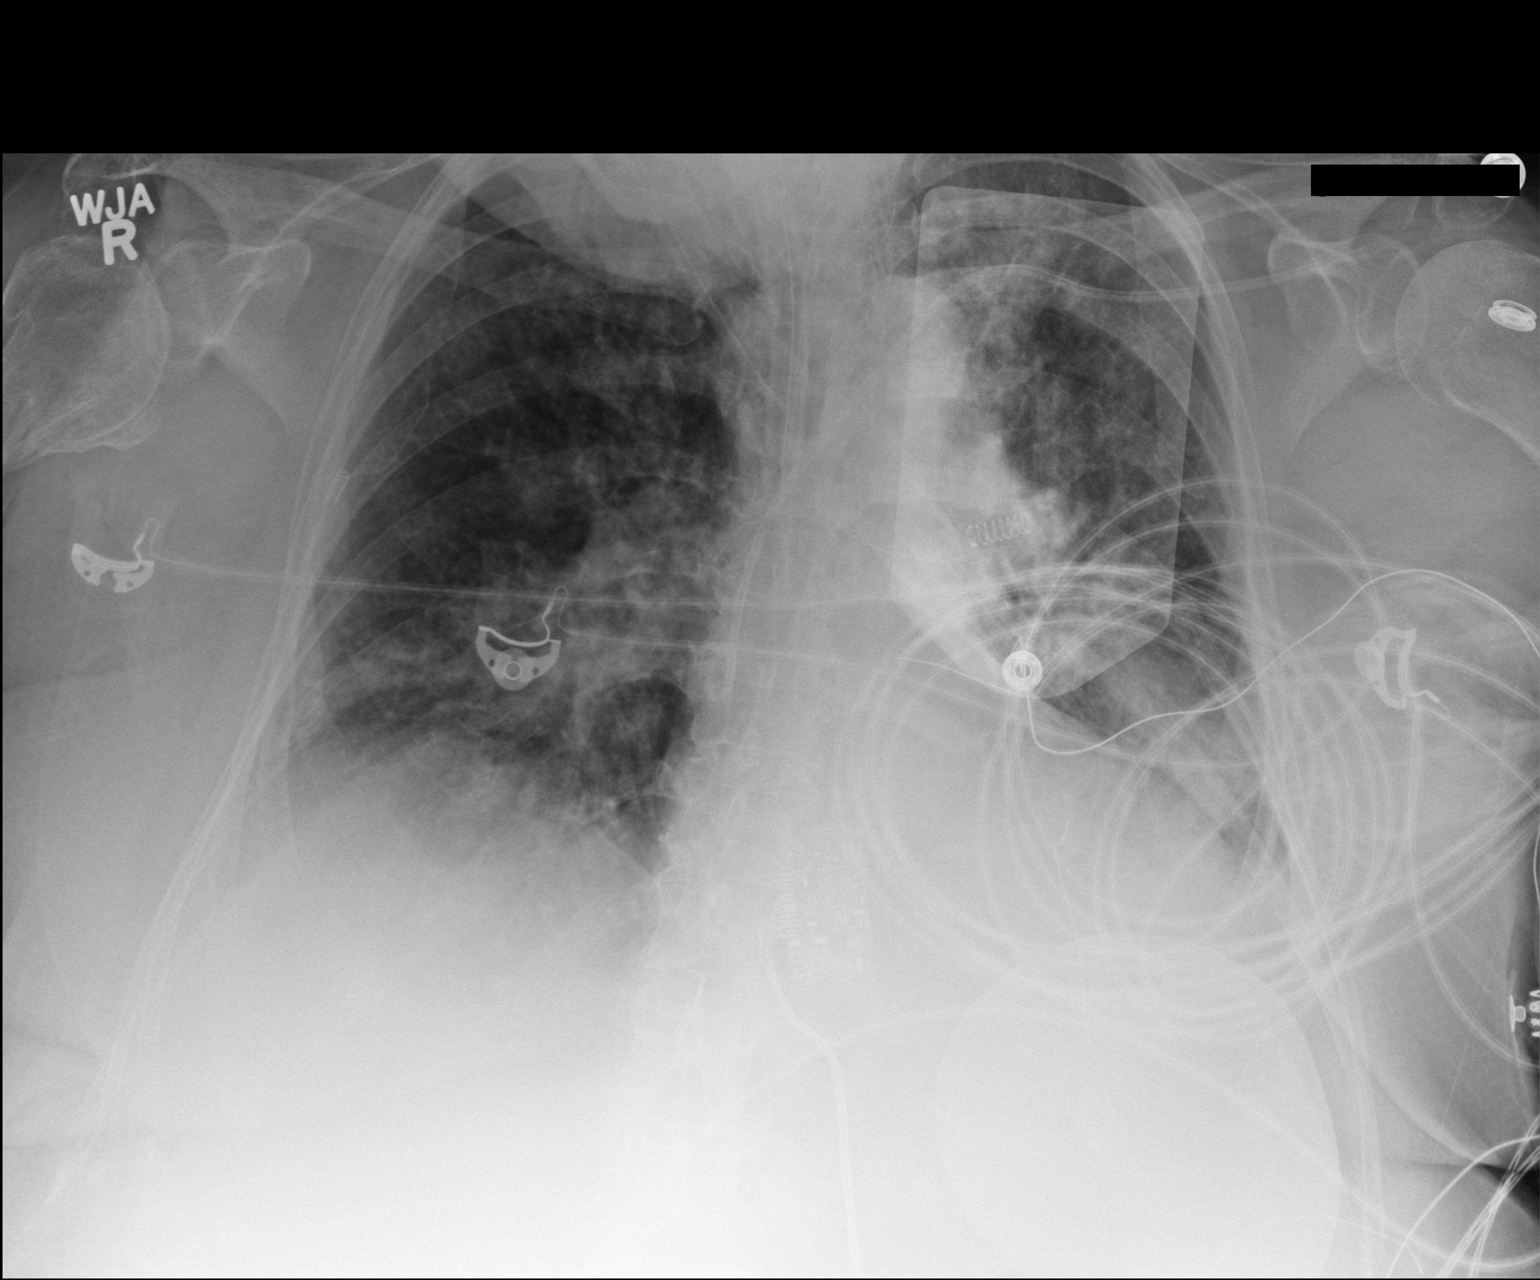

[1 of 1 positions shown; findings below may reference images not displayed]

FINDINGS: Endotracheal tube tip 1.8 cm above the carina. Left IJ line in
stable position. Heart size stable. Persistent patchy bilateral
pulmonary infiltrates noted consistent pneumonia. Small bilateral
effusions. No pneumothorax.
IMPRESSION: 1. Endotracheal tube tip 1.8 cm above the carina. Left subclavian
central line stable position.
2. Persistent patchy bilateral pulmonary infiltrates consistent with
multifocal pneumonia. Small bilateral pleural effusions are present.

## 2015-09-03 IMAGING — CR DG CHEST 1V PORT
1 series · 1 of 1 positions shown · non-contrast
Comparison: Chest radiograph performed 07/31/2015

CLINICAL DATA: Central line placement.  Initial encounter.

EXAM:
PORTABLE CHEST - 1 VIEW

[AP]
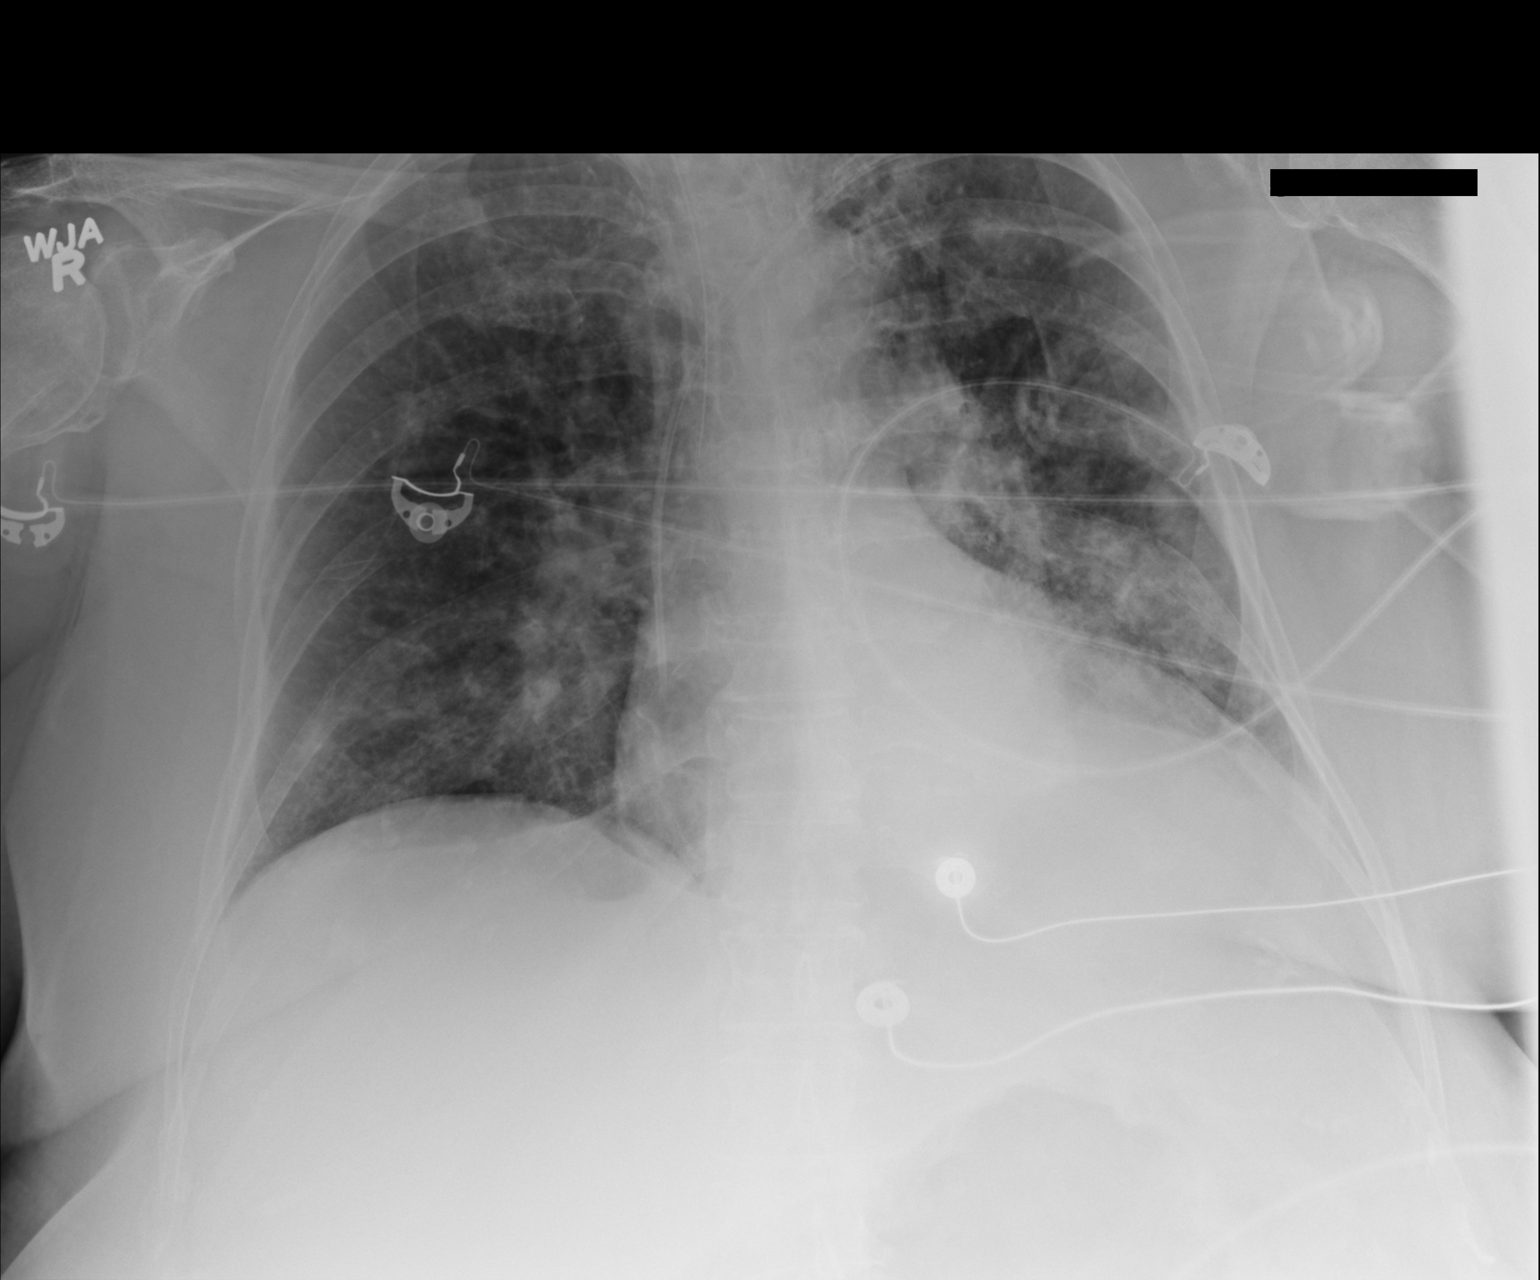

[1 of 1 positions shown; findings below may reference images not displayed]

FINDINGS: [name] left subclavian line is noted ending about the distal
SVC.

[name] endotracheal tube appears to end overlying the right
mainstem bronchus, 1-2 cm below the carina. This should be retracted
4 cm.

Vascular congestion is noted. Patchy bilateral airspace opacities
may reflect mild pneumonia or possibly mild interstitial edema. No
pleural effusion or pneumothorax is seen.

The cardiomediastinal silhouette is mildly enlarged. No acute
osseous abnormalities are identified.
IMPRESSION: 1. Left subclavian line noted ending about the distal SVC.
2. Endotracheal tube ends overlying the right mainstem bronchus, 1-2
cm below the carina. This should be retracted 4 cm.
3. Vascular congestion and mild cardiomegaly. Patchy bilateral
airspace opacities may reflect mild pneumonia or possibly mild
interstitial edema.
These results were called by telephone at the time of interpretation
on 08/01/2015 at [DATE] to Nursing on [facility], who verbally
acknowledged these results.

## 2015-09-05 IMAGING — CR DG CHEST 1V PORT
1 series · 1 of 1 positions shown · non-contrast
Comparison: 08/02/2015

CLINICAL DATA: Followup endotracheal tube

EXAM:
PORTABLE CHEST - 1 VIEW

[AP]
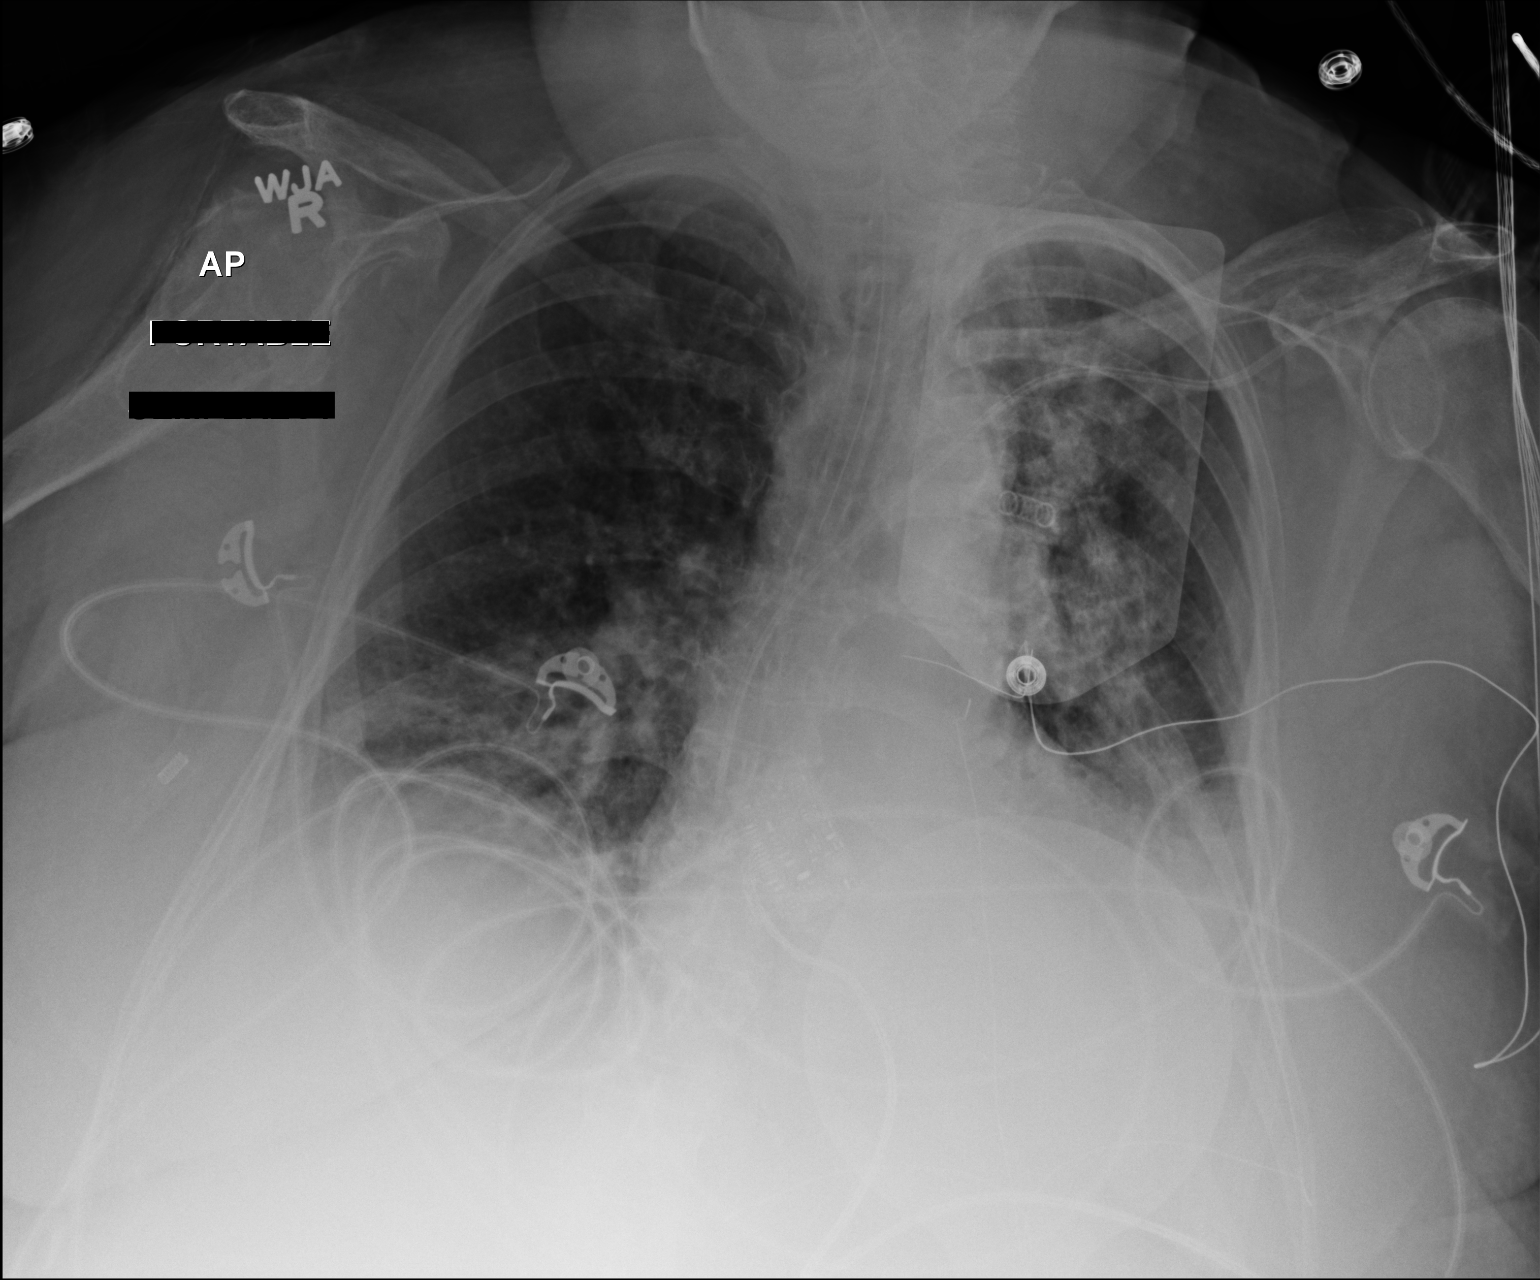

[1 of 1 positions shown; findings below may reference images not displayed]

FINDINGS: Cardiomediastinal silhouette is stable. Bilateral basilar
atelectasis or infiltrate. Endotracheal tube at the level of carina
towards right main bronchus. Endotracheal tube should be retracted
about 1 cm. There is no pneumothorax. There is left subclavian
central line with tip in right atrium. No pulmonary edema. There is
no pneumothorax.
IMPRESSION: Bilateral basilar atelectasis or infiltrate. Endotracheal tube at
the level of carina towards right main bronchus. Endotracheal tube
should be retracted about 1 cm. There is no pneumothorax. There is
left subclavian central line with tip in right atrium. No pulmonary
edema. There is no pneumothorax.

## 2015-09-06 IMAGING — CR DG CHEST 1V PORT
1 series · 1 of 1 positions shown · non-contrast
Comparison: None.

CLINICAL DATA: Intubation.

EXAM:
PORTABLE CHEST - 1 VIEW

[AP]
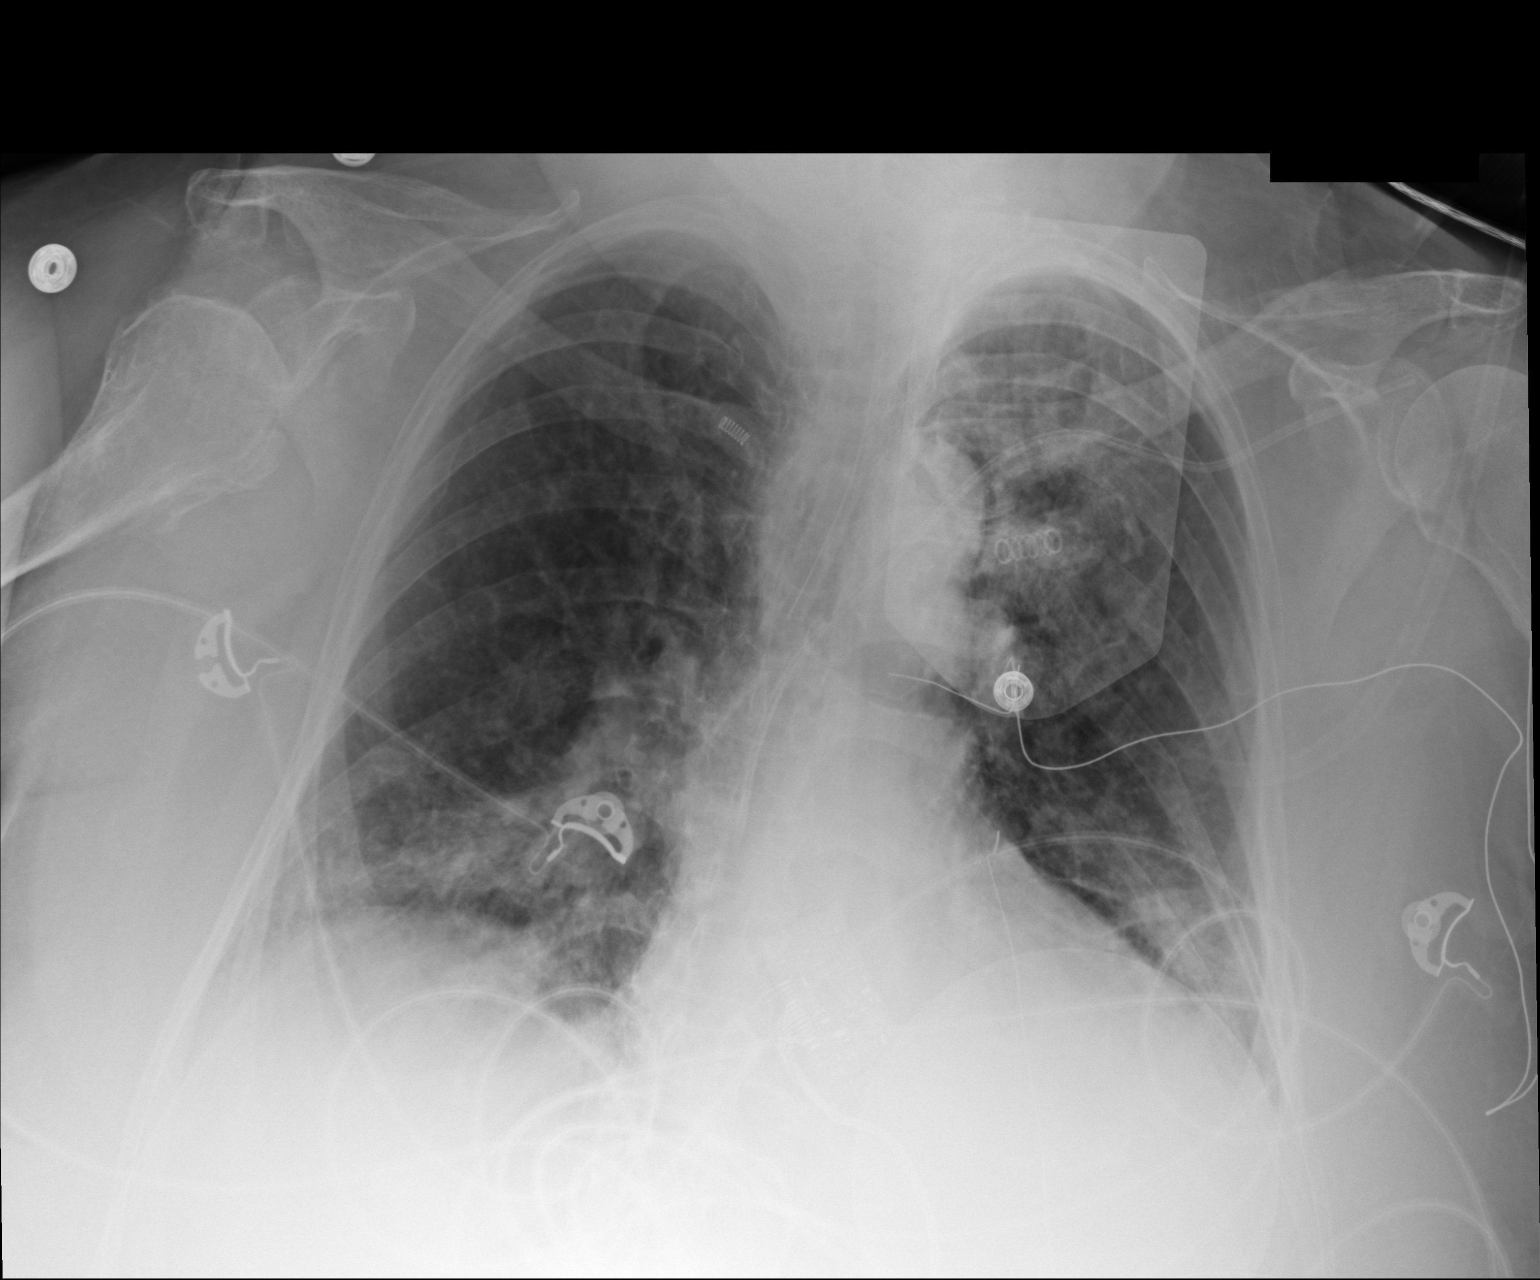

[1 of 1 positions shown; findings below may reference images not displayed]

FINDINGS: Endotracheal tube tip noted at the origin right mainstem bronchus.
Left subclavian central line noted with tip at cavoatrial junction.
Heart size normal. Persistent left upper lobe and bibasilar
pulmonary infiltrates. No pleural effusion or pneumothorax.
IMPRESSION: Endotracheal tube tip noted at the orifice of the right mainstem
bronchus. Retraction of 2 cm suggested. Left subclavian central line
in stable position.

Critical Value/emergent results were called by telephone at the time
of interpretation on 08/04/2015 at [DATE] to nurse Rubim, who
verbally acknowledged these results.
